# Patient Record
Sex: Female | Born: 1960 | Race: Black or African American | Hispanic: No | Marital: Married | Smoking: Former smoker
Health system: Southern US, Community
[De-identification: ages and names within clinical notes are randomized; demographics above are authoritative.]

## PROBLEM LIST (undated history)

## (undated) DIAGNOSIS — I480 Paroxysmal atrial fibrillation: Secondary | ICD-10-CM

## (undated) DIAGNOSIS — N898 Other specified noninflammatory disorders of vagina: Secondary | ICD-10-CM

## (undated) DIAGNOSIS — K219 Gastro-esophageal reflux disease without esophagitis: Secondary | ICD-10-CM

## (undated) DIAGNOSIS — E079 Disorder of thyroid, unspecified: Secondary | ICD-10-CM

## (undated) DIAGNOSIS — R7303 Prediabetes: Secondary | ICD-10-CM

## (undated) DIAGNOSIS — K5900 Constipation, unspecified: Secondary | ICD-10-CM

## (undated) DIAGNOSIS — N760 Acute vaginitis: Secondary | ICD-10-CM

## (undated) DIAGNOSIS — G4730 Sleep apnea, unspecified: Secondary | ICD-10-CM

## (undated) DIAGNOSIS — R319 Hematuria, unspecified: Secondary | ICD-10-CM

## (undated) DIAGNOSIS — R52 Pain, unspecified: Secondary | ICD-10-CM

## (undated) DIAGNOSIS — R87629 Unspecified abnormal cytological findings in specimens from vagina: Secondary | ICD-10-CM

## (undated) DIAGNOSIS — D649 Anemia, unspecified: Secondary | ICD-10-CM

## (undated) DIAGNOSIS — C3490 Malignant neoplasm of unspecified part of unspecified bronchus or lung: Secondary | ICD-10-CM

## (undated) DIAGNOSIS — I639 Cerebral infarction, unspecified: Secondary | ICD-10-CM

## (undated) DIAGNOSIS — M722 Plantar fascial fibromatosis: Secondary | ICD-10-CM

## (undated) DIAGNOSIS — I10 Essential (primary) hypertension: Secondary | ICD-10-CM

## (undated) DIAGNOSIS — D219 Benign neoplasm of connective and other soft tissue, unspecified: Secondary | ICD-10-CM

## (undated) DIAGNOSIS — I2510 Atherosclerotic heart disease of native coronary artery without angina pectoris: Secondary | ICD-10-CM

## (undated) DIAGNOSIS — B9689 Other specified bacterial agents as the cause of diseases classified elsewhere: Secondary | ICD-10-CM

## (undated) DIAGNOSIS — Z923 Personal history of irradiation: Secondary | ICD-10-CM

## (undated) DIAGNOSIS — G43909 Migraine, unspecified, not intractable, without status migrainosus: Secondary | ICD-10-CM

## (undated) DIAGNOSIS — M1990 Unspecified osteoarthritis, unspecified site: Secondary | ICD-10-CM

## (undated) DIAGNOSIS — M549 Dorsalgia, unspecified: Secondary | ICD-10-CM

## (undated) DIAGNOSIS — J45909 Unspecified asthma, uncomplicated: Secondary | ICD-10-CM

## (undated) DIAGNOSIS — E7800 Pure hypercholesterolemia, unspecified: Secondary | ICD-10-CM

## (undated) DIAGNOSIS — E119 Type 2 diabetes mellitus without complications: Secondary | ICD-10-CM

## (undated) DIAGNOSIS — I499 Cardiac arrhythmia, unspecified: Secondary | ICD-10-CM

## (undated) DIAGNOSIS — E039 Hypothyroidism, unspecified: Secondary | ICD-10-CM

## (undated) DIAGNOSIS — E785 Hyperlipidemia, unspecified: Secondary | ICD-10-CM

## (undated) DIAGNOSIS — R102 Pelvic and perineal pain: Secondary | ICD-10-CM

## (undated) HISTORY — DX: Hematuria, unspecified: R31.9

## (undated) HISTORY — DX: Plantar fascial fibromatosis: M72.2

## (undated) HISTORY — DX: Migraine, unspecified, not intractable, without status migrainosus: G43.909

## (undated) HISTORY — PX: KNEE SURGERY: SHX244

## (undated) HISTORY — DX: Acute vaginitis: N76.0

## (undated) HISTORY — DX: Hyperlipidemia, unspecified: E78.5

## (undated) HISTORY — PX: TUBAL LIGATION: SHX77

## (undated) HISTORY — DX: Pain, unspecified: R52

## (undated) HISTORY — DX: Benign neoplasm of connective and other soft tissue, unspecified: D21.9

## (undated) HISTORY — DX: Dorsalgia, unspecified: M54.9

## (undated) HISTORY — DX: Paroxysmal atrial fibrillation: I48.0

## (undated) HISTORY — DX: Other specified bacterial agents as the cause of diseases classified elsewhere: B96.89

## (undated) HISTORY — DX: Other specified noninflammatory disorders of vagina: N89.8

## (undated) HISTORY — PX: ECTOPIC PREGNANCY SURGERY: SHX613

## (undated) HISTORY — DX: Unspecified abnormal cytological findings in specimens from vagina: R87.629

## (undated) HISTORY — DX: Malignant neoplasm of unspecified part of unspecified bronchus or lung: C34.90

## (undated) HISTORY — DX: Constipation, unspecified: K59.00

## (undated) HISTORY — DX: Pure hypercholesterolemia, unspecified: E78.00

## (undated) HISTORY — DX: Pelvic and perineal pain: R10.2

---

## 2000-05-29 ENCOUNTER — Ambulatory Visit (HOSPITAL_COMMUNITY): Admission: RE | Admit: 2000-05-29 | Discharge: 2000-05-29 | Payer: Self-pay | Admitting: Internal Medicine

## 2000-05-29 ENCOUNTER — Encounter: Payer: Self-pay | Admitting: Internal Medicine

## 2000-06-01 ENCOUNTER — Other Ambulatory Visit: Admission: RE | Admit: 2000-06-01 | Discharge: 2000-06-01 | Payer: Self-pay | Admitting: Obstetrics and Gynecology

## 2000-06-17 ENCOUNTER — Ambulatory Visit (HOSPITAL_COMMUNITY): Admission: RE | Admit: 2000-06-17 | Discharge: 2000-06-17 | Payer: Self-pay | Admitting: Obstetrics and Gynecology

## 2000-06-17 ENCOUNTER — Encounter: Payer: Self-pay | Admitting: Obstetrics and Gynecology

## 2001-11-17 ENCOUNTER — Encounter: Payer: Self-pay | Admitting: Family Medicine

## 2001-11-17 ENCOUNTER — Ambulatory Visit (HOSPITAL_COMMUNITY): Admission: RE | Admit: 2001-11-17 | Discharge: 2001-11-17 | Payer: Self-pay | Admitting: Family Medicine

## 2002-06-28 ENCOUNTER — Ambulatory Visit (HOSPITAL_COMMUNITY): Admission: RE | Admit: 2002-06-28 | Discharge: 2002-06-28 | Payer: Self-pay | Admitting: Family Medicine

## 2003-01-26 ENCOUNTER — Emergency Department (HOSPITAL_COMMUNITY): Admission: EM | Admit: 2003-01-26 | Discharge: 2003-01-26 | Payer: Self-pay | Admitting: Emergency Medicine

## 2003-03-19 ENCOUNTER — Inpatient Hospital Stay (HOSPITAL_COMMUNITY): Admission: EM | Admit: 2003-03-19 | Discharge: 2003-03-23 | Payer: Self-pay | Admitting: Emergency Medicine

## 2003-05-26 ENCOUNTER — Encounter (HOSPITAL_COMMUNITY): Admission: RE | Admit: 2003-05-26 | Discharge: 2003-06-25 | Payer: Self-pay | Admitting: Orthopedic Surgery

## 2003-06-26 ENCOUNTER — Encounter (HOSPITAL_COMMUNITY): Admission: RE | Admit: 2003-06-26 | Discharge: 2003-07-26 | Payer: Self-pay | Admitting: Orthopedic Surgery

## 2003-08-08 ENCOUNTER — Encounter (HOSPITAL_COMMUNITY): Admission: RE | Admit: 2003-08-08 | Discharge: 2003-09-07 | Payer: Self-pay | Admitting: Orthopedic Surgery

## 2003-08-26 ENCOUNTER — Emergency Department (HOSPITAL_COMMUNITY): Admission: EM | Admit: 2003-08-26 | Discharge: 2003-08-26 | Payer: Self-pay | Admitting: Emergency Medicine

## 2003-08-28 ENCOUNTER — Inpatient Hospital Stay (HOSPITAL_COMMUNITY): Admission: RE | Admit: 2003-08-28 | Discharge: 2003-08-31 | Payer: Self-pay | Admitting: Orthopedic Surgery

## 2003-10-10 ENCOUNTER — Encounter (HOSPITAL_COMMUNITY): Admission: RE | Admit: 2003-10-10 | Discharge: 2003-11-09 | Payer: Self-pay | Admitting: Orthopedic Surgery

## 2003-11-14 ENCOUNTER — Encounter (HOSPITAL_COMMUNITY): Admission: RE | Admit: 2003-11-14 | Discharge: 2003-12-14 | Payer: Self-pay | Admitting: Orthopedic Surgery

## 2003-12-18 ENCOUNTER — Encounter (HOSPITAL_COMMUNITY): Admission: RE | Admit: 2003-12-18 | Discharge: 2004-01-17 | Payer: Self-pay | Admitting: Orthopedic Surgery

## 2004-01-22 ENCOUNTER — Encounter (HOSPITAL_COMMUNITY): Admission: RE | Admit: 2004-01-22 | Discharge: 2004-02-23 | Payer: Self-pay | Admitting: Orthopedic Surgery

## 2004-02-19 ENCOUNTER — Encounter: Admission: RE | Admit: 2004-02-19 | Discharge: 2004-05-19 | Payer: Self-pay | Admitting: Obstetrics and Gynecology

## 2004-10-16 ENCOUNTER — Ambulatory Visit (HOSPITAL_COMMUNITY): Admission: RE | Admit: 2004-10-16 | Discharge: 2004-10-16 | Payer: Self-pay | Admitting: Obstetrics and Gynecology

## 2004-11-01 ENCOUNTER — Emergency Department (HOSPITAL_COMMUNITY): Admission: EM | Admit: 2004-11-01 | Discharge: 2004-11-01 | Payer: Self-pay | Admitting: Emergency Medicine

## 2004-11-22 ENCOUNTER — Emergency Department (HOSPITAL_COMMUNITY): Admission: EM | Admit: 2004-11-22 | Discharge: 2004-11-22 | Payer: Self-pay | Admitting: Emergency Medicine

## 2005-05-03 ENCOUNTER — Emergency Department (HOSPITAL_COMMUNITY): Admission: EM | Admit: 2005-05-03 | Discharge: 2005-05-03 | Payer: Self-pay | Admitting: Emergency Medicine

## 2005-09-22 ENCOUNTER — Ambulatory Visit: Payer: Self-pay | Admitting: Orthopedic Surgery

## 2006-07-30 ENCOUNTER — Ambulatory Visit (HOSPITAL_COMMUNITY): Admission: RE | Admit: 2006-07-30 | Discharge: 2006-07-30 | Payer: Self-pay | Admitting: Obstetrics & Gynecology

## 2006-08-03 ENCOUNTER — Ambulatory Visit (HOSPITAL_COMMUNITY): Admission: RE | Admit: 2006-08-03 | Discharge: 2006-08-03 | Payer: Self-pay | Admitting: Obstetrics & Gynecology

## 2007-01-07 DIAGNOSIS — E049 Nontoxic goiter, unspecified: Secondary | ICD-10-CM

## 2007-01-07 HISTORY — DX: Nontoxic goiter, unspecified: E04.9

## 2007-03-10 ENCOUNTER — Ambulatory Visit: Payer: Self-pay | Admitting: Internal Medicine

## 2007-03-10 DIAGNOSIS — E1169 Type 2 diabetes mellitus with other specified complication: Secondary | ICD-10-CM | POA: Insufficient documentation

## 2007-03-10 DIAGNOSIS — J45909 Unspecified asthma, uncomplicated: Secondary | ICD-10-CM | POA: Insufficient documentation

## 2007-03-10 DIAGNOSIS — E119 Type 2 diabetes mellitus without complications: Secondary | ICD-10-CM | POA: Insufficient documentation

## 2007-03-10 DIAGNOSIS — E039 Hypothyroidism, unspecified: Secondary | ICD-10-CM | POA: Insufficient documentation

## 2007-03-10 DIAGNOSIS — K279 Peptic ulcer, site unspecified, unspecified as acute or chronic, without hemorrhage or perforation: Secondary | ICD-10-CM | POA: Insufficient documentation

## 2007-03-10 DIAGNOSIS — K219 Gastro-esophageal reflux disease without esophagitis: Secondary | ICD-10-CM | POA: Insufficient documentation

## 2007-03-10 DIAGNOSIS — E049 Nontoxic goiter, unspecified: Secondary | ICD-10-CM | POA: Insufficient documentation

## 2007-03-10 DIAGNOSIS — J310 Chronic rhinitis: Secondary | ICD-10-CM | POA: Insufficient documentation

## 2007-03-10 DIAGNOSIS — E785 Hyperlipidemia, unspecified: Secondary | ICD-10-CM | POA: Insufficient documentation

## 2007-03-10 DIAGNOSIS — E118 Type 2 diabetes mellitus with unspecified complications: Secondary | ICD-10-CM | POA: Insufficient documentation

## 2007-03-10 DIAGNOSIS — I10 Essential (primary) hypertension: Secondary | ICD-10-CM | POA: Insufficient documentation

## 2007-03-10 DIAGNOSIS — D509 Iron deficiency anemia, unspecified: Secondary | ICD-10-CM | POA: Insufficient documentation

## 2007-03-10 DIAGNOSIS — N951 Menopausal and female climacteric states: Secondary | ICD-10-CM | POA: Insufficient documentation

## 2007-03-11 ENCOUNTER — Telehealth (INDEPENDENT_AMBULATORY_CARE_PROVIDER_SITE_OTHER): Payer: Self-pay | Admitting: *Deleted

## 2007-03-17 ENCOUNTER — Ambulatory Visit (HOSPITAL_COMMUNITY): Admission: RE | Admit: 2007-03-17 | Discharge: 2007-03-17 | Payer: Self-pay | Admitting: Internal Medicine

## 2007-03-22 ENCOUNTER — Telehealth (INDEPENDENT_AMBULATORY_CARE_PROVIDER_SITE_OTHER): Payer: Self-pay | Admitting: *Deleted

## 2007-03-29 ENCOUNTER — Telehealth (INDEPENDENT_AMBULATORY_CARE_PROVIDER_SITE_OTHER): Payer: Self-pay | Admitting: *Deleted

## 2007-04-02 ENCOUNTER — Encounter (INDEPENDENT_AMBULATORY_CARE_PROVIDER_SITE_OTHER): Payer: Self-pay | Admitting: Internal Medicine

## 2007-04-07 ENCOUNTER — Encounter (INDEPENDENT_AMBULATORY_CARE_PROVIDER_SITE_OTHER): Payer: Self-pay | Admitting: Internal Medicine

## 2007-04-16 ENCOUNTER — Ambulatory Visit: Payer: Self-pay | Admitting: Internal Medicine

## 2007-04-16 DIAGNOSIS — L293 Anogenital pruritus, unspecified: Secondary | ICD-10-CM | POA: Insufficient documentation

## 2007-04-16 LAB — CONVERTED CEMR LAB
Blood Glucose, Fingerstick: 79
Hgb A1c MFr Bld: 6.2 %

## 2007-04-20 ENCOUNTER — Telehealth (INDEPENDENT_AMBULATORY_CARE_PROVIDER_SITE_OTHER): Payer: Self-pay | Admitting: *Deleted

## 2007-04-20 LAB — CONVERTED CEMR LAB
ALT: 18 units/L (ref 0–35)
AST: 14 units/L (ref 0–37)
Albumin: 4.5 g/dL (ref 3.5–5.2)
Alkaline Phosphatase: 59 units/L (ref 39–117)
BUN: 13 mg/dL (ref 6–23)
CO2: 28 meq/L (ref 19–32)
Calcium: 9.6 mg/dL (ref 8.4–10.5)
Chloride: 103 meq/L (ref 96–112)
Cholesterol: 235 mg/dL — ABNORMAL HIGH (ref 0–200)
Creatinine, Ser: 0.88 mg/dL (ref 0.40–1.20)
Free T4: 1.31 ng/dL (ref 0.89–1.80)
Glucose, Bld: 96 mg/dL (ref 70–99)
HDL: 59 mg/dL (ref >39)
LDL Cholesterol: 157 mg/dL — ABNORMAL HIGH (ref 0–99)
Potassium: 4.2 meq/L (ref 3.5–5.3)
Sodium: 140 meq/L (ref 135–145)
TSH: 1.468 microintl units/mL (ref 0.350–5.50)
Total Bilirubin: 0.7 mg/dL (ref 0.3–1.2)
Total CHOL/HDL Ratio: 4
Total Protein: 7.2 g/dL (ref 6.0–8.3)
Triglycerides: 97 mg/dL (ref <150)
VLDL: 19 mg/dL (ref 0–40)

## 2007-05-18 ENCOUNTER — Ambulatory Visit: Payer: Self-pay | Admitting: Internal Medicine

## 2007-05-18 DIAGNOSIS — M79609 Pain in unspecified limb: Secondary | ICD-10-CM | POA: Insufficient documentation

## 2007-06-23 ENCOUNTER — Ambulatory Visit: Payer: Self-pay | Admitting: Internal Medicine

## 2007-06-23 DIAGNOSIS — R071 Chest pain on breathing: Secondary | ICD-10-CM | POA: Insufficient documentation

## 2007-06-24 ENCOUNTER — Telehealth (INDEPENDENT_AMBULATORY_CARE_PROVIDER_SITE_OTHER): Payer: Self-pay | Admitting: *Deleted

## 2007-07-07 ENCOUNTER — Encounter (INDEPENDENT_AMBULATORY_CARE_PROVIDER_SITE_OTHER): Payer: Self-pay | Admitting: Internal Medicine

## 2007-07-07 ENCOUNTER — Ambulatory Visit: Payer: Self-pay | Admitting: Internal Medicine

## 2007-07-07 ENCOUNTER — Other Ambulatory Visit: Admission: RE | Admit: 2007-07-07 | Discharge: 2007-07-07 | Payer: Self-pay

## 2007-07-07 LAB — CONVERTED CEMR LAB: OCCULT 1: NEGATIVE

## 2007-08-27 ENCOUNTER — Telehealth (INDEPENDENT_AMBULATORY_CARE_PROVIDER_SITE_OTHER): Payer: Self-pay | Admitting: *Deleted

## 2007-12-22 ENCOUNTER — Ambulatory Visit (HOSPITAL_COMMUNITY): Admission: RE | Admit: 2007-12-22 | Discharge: 2007-12-22 | Payer: Self-pay | Admitting: Obstetrics and Gynecology

## 2008-02-03 ENCOUNTER — Encounter (INDEPENDENT_AMBULATORY_CARE_PROVIDER_SITE_OTHER): Payer: Self-pay | Admitting: Internal Medicine

## 2008-05-18 ENCOUNTER — Encounter (INDEPENDENT_AMBULATORY_CARE_PROVIDER_SITE_OTHER): Payer: Self-pay | Admitting: Internal Medicine

## 2008-05-28 ENCOUNTER — Observation Stay (HOSPITAL_COMMUNITY): Admission: EM | Admit: 2008-05-28 | Discharge: 2008-05-30 | Payer: Self-pay | Admitting: Emergency Medicine

## 2008-06-28 ENCOUNTER — Other Ambulatory Visit: Admission: RE | Admit: 2008-06-28 | Discharge: 2008-06-28 | Payer: Self-pay | Admitting: Obstetrics and Gynecology

## 2008-07-12 ENCOUNTER — Ambulatory Visit (HOSPITAL_COMMUNITY): Admission: RE | Admit: 2008-07-12 | Discharge: 2008-07-12 | Payer: Self-pay | Admitting: Obstetrics & Gynecology

## 2008-08-21 ENCOUNTER — Encounter (INDEPENDENT_AMBULATORY_CARE_PROVIDER_SITE_OTHER): Payer: Self-pay | Admitting: Internal Medicine

## 2009-01-06 DIAGNOSIS — R1310 Dysphagia, unspecified: Secondary | ICD-10-CM

## 2009-01-06 DIAGNOSIS — K921 Melena: Secondary | ICD-10-CM

## 2009-01-06 HISTORY — DX: Dysphagia, unspecified: R13.10

## 2009-01-06 HISTORY — DX: Melena: K92.1

## 2009-02-15 ENCOUNTER — Ambulatory Visit (HOSPITAL_COMMUNITY)
Admission: RE | Admit: 2009-02-15 | Discharge: 2009-02-15 | Payer: Self-pay | Admitting: Physical Medicine and Rehabilitation

## 2009-02-21 ENCOUNTER — Ambulatory Visit (HOSPITAL_COMMUNITY)
Admission: RE | Admit: 2009-02-21 | Discharge: 2009-02-21 | Payer: Self-pay | Admitting: Physical Medicine and Rehabilitation

## 2009-03-22 ENCOUNTER — Ambulatory Visit (HOSPITAL_COMMUNITY): Admission: RE | Admit: 2009-03-22 | Discharge: 2009-03-22 | Payer: Self-pay | Admitting: Obstetrics and Gynecology

## 2009-11-14 ENCOUNTER — Other Ambulatory Visit
Admission: RE | Admit: 2009-11-14 | Discharge: 2009-11-14 | Payer: Self-pay | Source: Home / Self Care | Admitting: Obstetrics and Gynecology

## 2009-11-20 ENCOUNTER — Encounter (INDEPENDENT_AMBULATORY_CARE_PROVIDER_SITE_OTHER): Payer: Self-pay | Admitting: *Deleted

## 2009-12-05 ENCOUNTER — Ambulatory Visit: Payer: Self-pay | Admitting: Internal Medicine

## 2009-12-05 DIAGNOSIS — R1310 Dysphagia, unspecified: Secondary | ICD-10-CM | POA: Insufficient documentation

## 2009-12-05 DIAGNOSIS — K921 Melena: Secondary | ICD-10-CM | POA: Insufficient documentation

## 2009-12-06 ENCOUNTER — Encounter: Payer: Self-pay | Admitting: Gastroenterology

## 2009-12-06 ENCOUNTER — Encounter: Payer: Self-pay | Admitting: Internal Medicine

## 2009-12-19 ENCOUNTER — Ambulatory Visit (HOSPITAL_COMMUNITY)
Admission: RE | Admit: 2009-12-19 | Discharge: 2009-12-19 | Payer: Self-pay | Source: Home / Self Care | Attending: Gastroenterology | Admitting: Gastroenterology

## 2009-12-19 HISTORY — PX: ESOPHAGOGASTRODUODENOSCOPY: SHX1529

## 2009-12-19 HISTORY — PX: OTHER SURGICAL HISTORY: SHX169

## 2009-12-21 ENCOUNTER — Telehealth (INDEPENDENT_AMBULATORY_CARE_PROVIDER_SITE_OTHER): Payer: Self-pay

## 2009-12-24 ENCOUNTER — Encounter (INDEPENDENT_AMBULATORY_CARE_PROVIDER_SITE_OTHER): Payer: Self-pay

## 2009-12-25 ENCOUNTER — Emergency Department (HOSPITAL_COMMUNITY)
Admission: EM | Admit: 2009-12-25 | Discharge: 2009-12-25 | Payer: Self-pay | Source: Home / Self Care | Admitting: Emergency Medicine

## 2010-01-28 ENCOUNTER — Encounter: Payer: Self-pay | Admitting: Internal Medicine

## 2010-02-07 NOTE — Letter (Signed)
Summary: Diabetes Care Flowsheet  Diabetes Care Flowsheet   Imported By: Carlye Grippe 08/28/2008 11:48:49  _____________________________________________________________________  External Attachment:    Type:   Image     Comment:   External Document

## 2010-02-07 NOTE — Progress Notes (Signed)
Summary: initial evaluation  initial evaluation   Imported By: Jacklynn Ganong 05/16/2008 08:03:42  _____________________________________________________________________  External Attachment:    Type:   Image     Comment:   External Document

## 2010-02-07 NOTE — Assessment & Plan Note (Signed)
Summary: new patient/arc   Vital Signs:  Patient Profile:   50 Years Old Female Height:     68.5 inches Weight:      241 pounds BMI:     36.24 O2 Sat:      98 % O2 treatment:    Room Air Pulse rate:   79 / minute Resp:     8 per minute BP sitting:   154 / 108  (left arm)  Vitals Entered By: Lutricia Horsfall (March 10, 2007 2:19 PM)                 Chief Complaint:  new patient/sinus problems.  History of Present Illness: 50 year old woman here to establish care.  She says she did not take her medications this morning.  She says she has been running to doctors' appointment.  She has been going to [facility].  She was seen this morning by Dr. Emelda Fear for a yeast infection.    She says she has trouble with her sinuses for 3 years.  It fluctuates during the year, but is not seasonal.  She has been given steroid shots 1 time since January and 2 times last year.  She has tried nasal spray also. She complains of nasal congestion, post nasal drip, headaches and lightheaded.  She also complains of nausea without vomiting.  She has episodes of sweats and dizziness with the nausea at times.  She has been given 2 z-packs recently.  She got one for her sinuses and she went back 2 weeks later and was told she had a touch of the flu and was given another z-pack, tamiflu and a  cough syrup.  SHe finished the z-pack yesterday.  She just got a prescription for nasocort filled.    She has some trouble with shortness of breath and has had a cough only for the past 2 weeks.    SHe has had diabetes since age 50 and it has been diet controlled.  She does not know when the last time it was checked was.  She says she is on synthroid for an underactive thyroid, but says she has had a thyroid goiter also.    Current Allergies: No known allergies   Past Medical History:    GERD    Hyperlipidemia    Hypertension    Peptic ulcer disease    thyroid goiter/hypothyroidism    asthma, childhood  menopause    Anemia-iron deficiency-acute, secondary to surgery    Diabetes mellitus, type II--diet controlled since age 50  Past Surgical History:    ORIF-right proximal tibia, failed, s/p revision 08/28/03    ectopic pregnancy x2 (removal of left fallopian tube and ovary)   Family History:    father-deceased-52-heart problem    mother-deceased-53-DM, HTN    Son-30--healthy    Daughters--    24-healthy    16-migraine headaches  Social History:    Married lives with spouse and children    Former Smoker-2ppd x20 years, quit 03/19/03    Alcohol use-no    Drug use-no   Risk Factors:  Tobacco use:  quit Drug use:  no Alcohol use:  no   Review of Systems  General      Denies chills, fever, and sweats.  Eyes      Denies blurring and double vision.  ENT      See HPI  CV      Denies chest pain or discomfort and palpitations.  Resp  See HPI  GI      See HPI      Denies abdominal pain and diarrhea.  GU      Denies dysuria, incontinence, and urinary frequency.  MS      Denies joint pain and muscle aches.  Neuro      See HPI      Denies numbness and seizures.  Psych      Denies anxiety and depression.  Endo      See HPI   Physical Exam  General:     Obese, in no acute distress. Alert and oriented X 3.  Eyes:     EOMI, PERRL, sclera anicteric  Ears:     Tympanic membranes clear.  Ear canals without erythema.  Nose:     moderate Erythema and swelling with clear drainage.  Mouth:     No eyrthema or exudate.  Neck:     No lymphadenopathy, JVD, or carotid bruits.  There is mild diffuse thyroid enlargement.  Lungs:     Clear to auscultation bilaterally with good air movement, normal expansion.  Heart:     Normal S1 and S2. No murmurs or extracardiac sounds. PMI is nondisplaced.  Abdomen:     Soft, nontender, nondistended, + bowel sounds, no organomegaly.  Extremities:     No edema, calf tenderness or swelling.     Impression &  Recommendations:  Problem # 1:  RHINITIS, CHRONIC (ICD-472.0) She has had multiple courses of antibiotics and steroids and still is having trouble with nasal congestion and sinus pressure.  We need further evaluation with a CT of the sinuses.  We are going to start her on Xyzal 5mg  at bedtime, have her use the nasocort AQ on a daily basis and see what the CT shows. Orders: CT without Contrast (CT w/o contrast)   Problem # 2:  HYPERTENSION (ICD-401.9) She did not take her medication this morning.  We need to see how her blood pressures look in her old records.   Her updated medication list for this problem includes:    Lisinopril-hydrochlorothiazide 20-12.5 Mg Tabs (Lisinopril-hydrochlorothiazide) .Marland Kitchen... 1 by mouth once daily   Problem # 3:  DIABETES MELLITUS, TYPE II (ICD-250.00) We need records to see when her last HbA1c, urine microalbumin,... were.   Her updated medication list for this problem includes:    Lisinopril-hydrochlorothiazide 20-12.5 Mg Tabs (Lisinopril-hydrochlorothiazide) .Marland Kitchen... 1 by mouth once daily   Complete Medication List: 1)  Levoxyl 88 Mcg Tabs (Levothyroxine sodium) .Marland Kitchen.. 1 by mouth once daily 2)  Lisinopril-hydrochlorothiazide 20-12.5 Mg Tabs (Lisinopril-hydrochlorothiazide) .Marland Kitchen.. 1 by mouth once daily 3)  Piroxicam 20 Mg Caps (Piroxicam) .Marland Kitchen.. 1 by mouth once daily 4)  Vytorin 10-40 Mg Tabs (Ezetimibe-simvastatin) .Marland Kitchen.. 1 by mouth once daily 5)  Climara Pro 0.045-0.015 Mg/day Ptwk (Estradiol-levonorgestrel) .... Apply once weekly 6)  Nasacort Aq 55 Mcg/act Aers (Triamcinolone acetonide(nasal)) .... 2 sprays in each nostril once daily 7)  Xyzal 5 Mg Tabs (Levocetirizine dihydrochloride) .Marland Kitchen.. 1 by mouth at bedtime   Patient Instructions: 1)  The patient will be called with the results when they are available. 2)  We will let her know when I have had a chance to review her old records and see when she needs her diabetic follow up.      Prescriptions: FLUTICASONE  PROPIONATE 50 MCG/ACT  SUSP (FLUTICASONE PROPIONATE) 2 sprays in each nostril two times a day  #1 bottle x 5   Entered and Authorized by:   Erle Crocker MD  Signed by:   Erle Crocker MD on 03/10/2007   Method used:   Print then Give to Patient   RxID:   [id number]  ]    Preventive Care Screening  Mammogram:    Date:  08/03/2006    Next Due:  08/2007    Results:  bi rads 1  Ejection Fraction:    Date:  06/28/2002    Results:  normal   Appended Document: new patient/arc records requested on 03.05.09 from [facility]...arc

## 2010-02-07 NOTE — Progress Notes (Signed)
Summary: CT referral  Phone Note Outgoing Call   Call placed by: Sonny Dandy,  March 11, 2007 9:37 AM Summary of Call: order faxed to [facility], appointment set for 03/17/07 at 900am, order faxed and message left for patient to return call .................................................................Marland KitchenMarland KitchenSonny Dandy  March 11, 2007 9:37 AM  referral info given to patient, voices understanding .................................................................Marland KitchenMarland KitchenSonny Dandy  March 12, 2007 11:16 AM  Initial call taken by: Sonny Dandy,  March 12, 2007 11:16 AM

## 2010-02-07 NOTE — Progress Notes (Signed)
Summary: still in pain  Phone Note Call from Patient   Reason for Call: Talk to Nurse Summary of Call: patient called stating that the shot didn't help and she is still in a lot of pain...  she is at work...please call her there...[phone] Initial call taken by: Donneta Romberg,  June 24, 2007 9:27 AM  Follow-up for Phone Call        attempted to call pt at work, she is on break, has left the building. Follow-up by: Lutricia Horsfall,  June 25, 2007 1:55 PM  Additional Follow-up for Phone Call Additional follow up Details #1::        States that she is feeling better but still not great, having to take Ibuprofen 4 tabs 2-3 times daily.  Advised her that if she is still not feeling better by the end of the week that she should call to be seen.  Pt verbalized understanding. Additional Follow-up by: Lutricia Horsfall,  June 29, 2007 10:00 AM

## 2010-02-07 NOTE — Letter (Signed)
Summary: REFERRAL FROM [facility]  REFERRAL FROM [facility]   Imported By: Rexene Alberts 12/06/2009 16:16:14  _____________________________________________________________________  External Attachment:    Type:   Image     Comment:   External Document

## 2010-02-07 NOTE — Progress Notes (Signed)
Summary: phone note/ pt having chest discomfort  Phone Note Call from Patient   Caller: Patient Summary of Call: Pt called. Had procedures on 12/19/2009. Having some chest discomfort in center of chest intermittently. Relieved when she burps. Wants to know what she should do. I told her it would relay the message, but to go to the hospital if it worsens before she hears back from [place].  Initial call taken by: Cloria Spring LPN,  December 21, 2009 1:55 PM     Appended Document: phone note/ pt having chest discomfort Per Dr. Darrick Penna, pt is most likely having reflux. Continue Nexium as prescribed. May take Mylanta or Maalox as needed. Pt to call if continues to have problems. Pt is informed.

## 2010-02-07 NOTE — Letter (Signed)
Summary: Unable to Reach, Consult Scheduled  [facility]  [address]   [place], [place] [address]   Phone: [phone]  Fax: [phone]    11/20/2009  Becky [name] [address] [place], [place]  [address] [birth year]   Dear [name],   We have been unable to reach you by phone.  At the recommendation of [facility] we have been asked to schedule you a consult with DR Jena Gauss for ACID REFLUX.   Please call our office at [phone].     Thank you,    Diana Eves  [facility] R. Roetta Sessions, M.D.    Jonette Eva, M.D. Lorenza Burton, FNP-BC    Tana Coast, PA-C Phone: [phone]    Fax: [phone]
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
## 2010-02-07 NOTE — Letter (Signed)
Summary: Services Final Notice  [facility]  [address]   [place], [place] [address]   Phone: [phone]  Fax: [phone]    05/18/2008  DANICIA TERHAAR [address] [place], [place]  [address]  Dear Ms. Solar,   The staff at [facility] is dedicated to providing the highest quality health care to our patients. Our records indicate that you were sent a letter on 02-03-2008 advising of overdue followup of your cholesterol, hypertension, thyroid and mammogram.  Due to your inaction on followup of your medical condition, we are sending you this Final Notice that you will be discharged from our practice one month from the date of this letter if followup is not completed.  Please call our office to schedule a visit.  Sincerely,   Micah Flesher, LPN

## 2010-02-07 NOTE — Progress Notes (Signed)
Summary: wants samples  Phone Note Call from Patient   Reason for Call: Talk to Nurse Summary of Call: patient is requesting samples of Xyzal...  [phone] Initial call taken by: Donneta Romberg,  August 27, 2007 3:15 PM  Follow-up for Phone Call        Samples left at front desk. Follow-up by: Lutricia Horsfall,  August 27, 2007 3:30 PM  Additional Follow-up for Phone Call Additional follow up Details #1::        advised patient Additional Follow-up by: Donneta Romberg,  August 27, 2007 3:39 PM      Prescriptions: XYZAL 5 MG  TABS (LEVOCETIRIZINE DIHYDROCHLORIDE) 1 by mouth at bedtime  #10 x 0   Entered by:   Sherrie Geophysicist/field seismologist   Authorized by:   Erle Crocker MD   Signed by:   Donneta Romberg on 08/27/2007   Method used:   Samples Given   RxID:   [id number]

## 2010-02-07 NOTE — Letter (Signed)
Summary: Plan of Care, Need to Discuss  [facility]  [address]   [place], [place] [address]   Phone: [phone]  Fax: [phone]    December 24, 2009  Becky [name] [address] [place], [place]  [address] [birth year]   Dear Ms. Poss,   We are writing this letter to inform you of treatment plans and/or discuss your plan of care.  We have tried several times to contact you; however, we have yet to reach you.  We ask that you please contact our office for follow-up on your gastrointestinal issues.  We can  be reached at [phone] to schedule an appointment, or to speak with someone regarding your health care needs.  Please do not neglect your health.   Sincerely,    Cloria Spring LPN  [facility] Ph: [phone]    Fax: [phone]

## 2010-02-07 NOTE — Progress Notes (Signed)
Summary: CT results  Phone Note Outgoing Call   Call placed by: Sonny Dandy,  March 22, 2007 8:32 AM Summary of Call: results given to patient and advised to call next week, voices understanding  Initial call taken by: Sonny Dandy,  March 22, 2007 8:32 AM

## 2010-02-07 NOTE — Letter (Signed)
Summary: EGD/ED/TCS ORDER  EGD/ED/TCS ORDER   Imported By: Ave Filter 12/06/2009 14:06:14  _____________________________________________________________________  External Attachment:    Type:   Image     Comment:   External Document

## 2010-02-07 NOTE — Letter (Signed)
Summary: Services Due  [facility]  [address]   [place], [place] [address]   Phone: [phone]  Fax: [phone]    02/03/2008   Becky [name] [address] [place], [place]  [address]   Dear  Becky [name],  The staff at [facility] is dedicated to providing the highest quality health care to our patients.  Our records indicate that you are overdue for followup of your cholesterol, hypertension, thyroid and mammogram.  Please call our office to schedule a visit.   Sincerely,  Erle Crocker MD

## 2010-02-07 NOTE — Progress Notes (Signed)
Summary: Xyzal  Phone Note Call from Patient   Summary of Call: patient states the last time she was in you gave her samples of XYZAL 5 mg. she wants to know if she can get a prescription called into [facility], if this is too expensive, do you have samples.  She says she is feeling much better, and she can tell when she takes this medication vs. when she doesn't. Please advise. She can be reached at [facility] at [phone], until 5 or after 5 she can be reached at [phone]. thanks Initial call taken by: Curtis Sites,  March 29, 2007 4:15 PM  Follow-up for Phone Call        Rx sent to [facility]. Follow-up by: Lutricia Horsfall,  March 29, 2007 4:29 PM  Additional Follow-up for Phone Call Additional follow up Details #1::        I called and let the patient know. Additional Follow-up by: Curtis Sites,  March 30, 2007 9:25 AM      Prescriptions: XYZAL 5 MG  TABS (LEVOCETIRIZINE DIHYDROCHLORIDE) 1 by mouth at bedtime  #30 x 5   Entered by:   Sherrie Geophysicist/field seismologist   Authorized by:   Erle Crocker MD   Signed by:   Curtis Sites on 03/30/2007   Method used:   Electronically sent to ...       [facility]*       [address]       [place], [place]  [address]       Ph: [phone] or [phone]       Fax: [phone]   RxID:   [id number]

## 2010-02-07 NOTE — Assessment & Plan Note (Signed)
Summary: HTn/lipids/thyroid/rhinitis/   Vital Signs:  Patient Profile:   50 Years Old Female Height:     68.5 inches O2 Sat:      98 % O2 treatment:    Room Air Pulse rate:   71 / minute Resp:     8 per minute BP sitting:   142 / 102  (left arm)  Vitals Entered By: Lutricia Horsfall (April 16, 2007 8:48 AM)             CBG Result 79     Chief Complaint:  followup DM and HTN.  History of Present Illness: Here for routine follow up.  Her sinus symptoms and breathing are controlled with Xyzal.  She hasn't taken her blood pressure medication yet as she is fasting for labs.  She is also complaining of some vaginal itching with a clear yellow discharge.  SHe thinks she has a yeast infection but is not having thick white discharge.  She is wondering about less expensive medications, especially her climara patch.  SHe uses it for hot flashes and over the counter medications have been ineffective.      Current Allergies: No known allergies   Past Medical History:    Reviewed history from 03/10/2007 and no changes required:       GERD       Hyperlipidemia       Hypertension       Peptic ulcer disease       thyroid goiter/hypothyroidism       asthma, childhood       menopause       Anemia-iron deficiency-acute, secondary to surgery       Diabetes mellitus, type II--diet controlled since age 89  Past Surgical History:    Reviewed history from 03/10/2007 and no changes required:       ORIF-right proximal tibia, failed, s/p revision 08/28/03       ectopic pregnancy x2 (removal of left fallopian tube and ovary)   Family History:    Reviewed history from 03/10/2007 and no changes required:       father-deceased-52-heart problem       mother-deceased-53-DM, HTN       Son-30--healthy       Daughters--       24-healthy       16-migraine headaches  Social History:    Reviewed history from 03/10/2007 and no changes required:       Married lives with spouse and children  Former Smoker-2ppd x20 years, quit 03/19/03       Alcohol use-no       Drug use-no     Physical Exam  General:     Obese, in no acute distress. Alert and oriented X 3.  Lungs:     Clear to auscultation bilaterally with good air movement, normal expansion.  Heart:     Normal S1 and S2. No murmurs or extracardiac sounds. PMI is nondisplaced.  Extremities:     No edema, calf tenderness or swelling.     Impression & Recommendations:  Problem # 1:  HYPERTENSION (ICD-401.9) Her blood pressure is up today but she hasn't taken her medication.  Her home blood pressures sound adequate.  She is due for labs. Her updated medication list for this problem includes:    Lisinopril-hydrochlorothiazide 20-12.5 Mg Tabs (Lisinopril-hydrochlorothiazide) .Marland Kitchen... 1 by mouth once daily  Orders: T-Comprehensive Metabolic Panel ([id number]) Venipuncture (56213)   Problem # 2:  HYPERLIPIDEMIA (ICD-272.4) Lipids and liver enzymes are drawn today  and adjustments will be made in medications based on the lab results.  She is interested in something less expensive for her cholesterol so we will see what her lipid panel shows and if we can will change her to simvastatin at the 40 or 80mg  dose.    Her updated medication list for this problem includes:    Vytorin 10-40 Mg Tabs (Ezetimibe-simvastatin) .Marland Kitchen... 1 by mouth once daily  Orders: T-Comprehensive Metabolic Panel [phone]) T-Lipid Profile ([id number])   Problem # 3:  HYPOTHYROIDISM (ICD-244.9) The patient appears clinically euthyroid.  TSH and free T4 will be checked and medication adjusted as needed based on the results.  Her updated medication list for this problem includes:    Levoxyl 88 Mcg Tabs (Levothyroxine sodium) .Marland Kitchen... 1 by mouth once daily  Orders: T-TSH ([id number]) T-T4, Free [id number])   Problem # 4:  RHINITIS, CHRONIC (ICD-472.0) Doing well with Xyzal.  Problem # 5:  VAGINAL PRURITUS (ICD-698.1) It sounds more  like bacterial vaginosis so we are going to try flagyl 2gm by mouth.    Problem # 6:  MENOPAUSAL SYNDROME (ICD-627.2) We will try estradiol and provera daily for cost savings. Her updated medication list for this problem includes:    Estradiol 1 Mg Tabs (Estradiol) .Marland Kitchen... 1 by mouth once daily   Complete Medication List: 1)  Levoxyl 88 Mcg Tabs (Levothyroxine sodium) .Marland Kitchen.. 1 by mouth once daily 2)  Lisinopril-hydrochlorothiazide 20-12.5 Mg Tabs (Lisinopril-hydrochlorothiazide) .Marland Kitchen.. 1 by mouth once daily 3)  Piroxicam 20 Mg Caps (Piroxicam) .Marland Kitchen.. 1 by mouth once daily 4)  Vytorin 10-40 Mg Tabs (Ezetimibe-simvastatin) .Marland Kitchen.. 1 by mouth once daily 5)  Estradiol 1 Mg Tabs (Estradiol) .Marland Kitchen.. 1 by mouth once daily 6)  Nasacort Aq 55 Mcg/act Aers (Triamcinolone acetonide(nasal)) .... 2 sprays in each nostril once daily 7)  Xyzal 5 Mg Tabs (Levocetirizine dihydrochloride) .Marland Kitchen.. 1 by mouth at bedtime 8)  Metronidazole 500 Mg Tabs (Metronidazole) .... 4 by mouth at one time 9)  Medroxyprogesterone Acetate 2.5 Mg Tabs (Medroxyprogesterone acetate) .Marland Kitchen.. 1 by mouth once daily   Patient Instructions: 1)  The patient will be called with the results when they are available. 2)  Please schedule a follow-up appointment in 6 months.    Prescriptions: MEDROXYPROGESTERONE ACETATE 2.5 MG  TABS (MEDROXYPROGESTERONE ACETATE) 1 by mouth once daily  #90 x 1   Entered and Authorized by:   Erle Crocker MD   Signed by:   Erle Crocker MD on 04/16/2007   Method used:   Electronically sent to ...       [facility]*       [address]       [place], [place]  [address]       Ph: [phone] or [phone]       Fax: [phone]   RxID:   [id number] ESTRADIOL 1 MG  TABS (ESTRADIOL) 1 by mouth once daily  #90 x 1   Entered and Authorized by:   Erle Crocker MD   Signed by:   Erle Crocker MD on 04/16/2007   Method used:   Electronically sent to ...       [facility]*       [address]       [place], [place]  [address]       Ph: [phone] or [phone]       Fax: [phone]   RxID:  [id number] METRONIDAZOLE 500 MG  TABS (METRONIDAZOLE) 4 by mouth at one time  #4 x 0   Entered and Authorized by:   Erle Crocker MD   Signed by:   Erle Crocker MD on 04/16/2007   Method used:   Electronically sent to ...       [facility]*       [address]       [place], [place]  [address]       Ph: [phone] or [phone]       Fax: [phone]   RxID:   [id number]  ] Laboratory Results   Blood Tests   Date/Time Recieved: April 16, 2007 8:49 AM   HGBA1C: 6.2%   (Normal Range: Non-Diabetic - 3-6%   Control Diabetic - 6-8%) CBG Random: 79

## 2010-02-07 NOTE — Assessment & Plan Note (Signed)
Summary: chest Ladene Allocca pain   Vital Signs:  Patient Profile:   50 Years Old Female Height:     68.5 inches O2 Sat:      99 % O2 treatment:    Room Air Pulse rate:   76 / minute Resp:     12 per minute BP sitting:   126 / 84  (right arm)  Vitals Entered By: Lutricia Horsfall (June 23, 2007 2:59 PM)                 Chief Complaint:  left shoulder pain.  History of Present Illness: Complaining of pain in upper chest and shoulder blade that she awoke with this morning.  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      Current Allergies: No known allergies       Physical Exam  General:     Obese, in no acute distress. Alert and oriented X 3.\par  Chest Taron Conrey:     + tenderness to palpation over left upper chest Ortencia Askari.  Msk:     normal ROM without tenderness at left shoulder.      Impression & Recommendations:  Problem # 1:  CHEST Florena Kozma PAIN, ANTERIOR (ICD-786.52) IM toradol today.  Aleve 2 by mouth two times a day as needed. The following medications were removed from the medication list:    Piroxicam 20 Mg Caps (Piroxicam) .Marland Kitchen... 1 by mouth once daily  Orders: Ketorolac-Toradol 15mg  (Z6109) Admin of Therapeutic Inj  intramuscular or subcutaneous (60454)   Complete Medication List: 1)  Levoxyl 88 Mcg Tabs (Levothyroxine sodium) .Marland Kitchen.. 1 by mouth once daily 2)  Lisinopril-hydrochlorothiazide 20-12.5 Mg Tabs (Lisinopril-hydrochlorothiazide) .Marland Kitchen.. 1 by mouth once daily 3)  Simvastatin 80 Mg Tabs (Simvastatin) .Marland Kitchen.. 1 by mouth once daily 4)  Estradiol 1 Mg Tabs (Estradiol) .Marland Kitchen.. 1 by mouth once daily 5)  Omnaris 50 Mcg/act Susp (Ciclesonide) .... 2 sprays in each nostril once daily 6)  Xyzal 5 Mg Tabs (Levocetirizine dihydrochloride) .Marland Kitchen.. 1 by mouth at bedtime 7)  Metronidazole 500 Mg Tabs (Metronidazole) .... 4 by mouth at one time 8)  Medroxyprogesterone Acetate 2.5 Mg Tabs (Medroxyprogesterone  acetate) .Marland Kitchen.. 1 by mouth once daily    ]  Medication Administration  Injection # 1:    Medication: Ketorolac-Toradol 15mg     Diagnosis: CHEST Andee Chivers PAIN, ANTERIOR (ICD-786.52)    Route: IM    Site: RUOQ gluteus    Exp Date: 4/10    Lot #: [id number]    Mfr: Alfonso Ramus    Comments: 60mg  IMper MD order    Patient tolerated injection without complications    Given by: Lutricia Horsfall (June 23, 2007 3:05 PM)  Orders Added: 1)  Est. Patient Level III [14782] 2)  Ketorolac-Toradol 15mg  [J1885] 3)  Admin of Therapeutic Inj  intramuscular or subcutaneous [95621]

## 2010-02-07 NOTE — Letter (Signed)
Summary: ABI  lower extremity   Imported By: Curtis Sites 05/18/2007 11:49:35  _____________________________________________________________________  External Attachment:    Type:   Image     Comment:   External Document
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
## 2010-02-07 NOTE — Letter (Signed)
Summary: Historic Patient File  Historic Patient File   Imported By: Curtis Sites 04/07/2007 11:39:31  _____________________________________________________________________  External Attachment:    Type:   Image     Comment:   External Document

## 2010-02-07 NOTE — Progress Notes (Signed)
Summary: 04/16/07 lab results  Phone Note Outgoing Call   Call placed by: Sonny Dandy,  April 20, 2007 8:34 AM Summary of Call: Results given to patient, voices understanding , will call med into [facility] pharmacy Initial call taken by: Sonny Dandy,  April 20, 2007 8:35 AM

## 2010-02-07 NOTE — Letter (Signed)
Summary: Primary Care Appointment Letter  [facility]  [address]   [place], [place] [address]   Phone: [phone]  Fax: [phone]    04/02/2007 MRN: [id number]  ISIS COSTANZA [address] [place], [place]  [address]  Dear Ms. Dorko,   Your Primary Care Physician  has indicated that:    ____X___it is time to schedule an appointment.  We have reviewed your records from [facility] and it does not appear that you have had a HgA1c or microalbumin check since 2007.  _______you missed your appointment on______ and need to call and          reschedule.    _______you need to have lab work done.    _______you need to schedule an appointment discuss lab or test results.    _______you need to call to reschedule your appointment that is                       scheduled on _________.     Please call our office as soon as possible. Our phone number is [phone]. Our office is open 8a-12noon and 1p-5p, Monday through Friday.     Thank you,    Sherrie, LPN

## 2010-02-07 NOTE — Letter (Signed)
Summary: Work [facility]  [address]   [place], [place] [address]   Phone: [phone]  Fax: [phone]    Today's Date: April 16, 2007  Name of [name]  The above named patient had a medical visit today at:  8:30 AM and she was at the office until 9:30AM..  Please take this into consideration when reviewing the time away from work/school.    Special Instructions:  [  ] None  [  ] To be off the remainder of today, returning to the normal work / school schedule tomorrow.  [  ] To be off until the next scheduled appointment on ______________________.  [  ] Other ________________________________________________________________ ________________________________________________________________________   Sincerely yours,   Erle Crocker MD

## 2010-03-18 LAB — GLUCOSE, CAPILLARY: Glucose-Capillary: 147 mg/dL — ABNORMAL HIGH (ref 70–99)

## 2010-03-26 ENCOUNTER — Encounter (INDEPENDENT_AMBULATORY_CARE_PROVIDER_SITE_OTHER): Payer: Self-pay | Admitting: *Deleted

## 2010-04-04 NOTE — Letter (Signed)
Summary: Recall Office Visit  [facility]  [address]   [place], [place] [address]   Phone: [phone]  Fax: [phone]      March 26, 2010   Becky [name] [address] [place], [place]  [address] 1960-10-23   Dear [name],   According to our records, it is time for you to schedule a follow-up office visit with [place].   At your convenience, please call [phone] to schedule an office visit. If you have any questions, concerns, or feel that this letter is in error, we would appreciate your call.   Sincerely,    Diana Eves  [facility] Ph: [phone]   Fax: [phone]

## 2010-04-16 LAB — BASIC METABOLIC PANEL
BUN: 10 mg/dL (ref 6–23)
BUN: 12 mg/dL (ref 6–23)
BUN: 9 mg/dL (ref 6–23)
CO2: 30 mEq/L (ref 19–32)
CO2: 30 mEq/L (ref 19–32)
CO2: 32 mEq/L (ref 19–32)
Calcium: 9.3 mg/dL (ref 8.4–10.5)
Calcium: 9.4 mg/dL (ref 8.4–10.5)
Calcium: 9.8 mg/dL (ref 8.4–10.5)
Chloride: 101 mEq/L (ref 96–112)
Chloride: 102 mEq/L (ref 96–112)
Chloride: 105 mEq/L (ref 96–112)
Creatinine, Ser: 0.85 mg/dL (ref 0.4–1.2)
Creatinine, Ser: 0.87 mg/dL (ref 0.4–1.2)
Creatinine, Ser: 0.92 mg/dL (ref 0.4–1.2)
GFR calc Af Amer: >60 mL/min (ref >60)
GFR calc Af Amer: >60 mL/min (ref >60)
GFR calc Af Amer: >60 mL/min (ref >60)
GFR calc non Af Amer: >60 mL/min (ref >60)
GFR calc non Af Amer: >60 mL/min (ref >60)
GFR calc non Af Amer: >60 mL/min (ref >60)
Glucose, Bld: 103 mg/dL — ABNORMAL HIGH (ref 70–99)
Glucose, Bld: 112 mg/dL — ABNORMAL HIGH (ref 70–99)
Glucose, Bld: 132 mg/dL — ABNORMAL HIGH (ref 70–99)
Potassium: 3.3 mEq/L — ABNORMAL LOW (ref 3.5–5.1)
Potassium: 3.8 mEq/L (ref 3.5–5.1)
Potassium: 4.1 mEq/L (ref 3.5–5.1)
Sodium: 137 mEq/L (ref 135–145)
Sodium: 138 mEq/L (ref 135–145)
Sodium: 138 mEq/L (ref 135–145)

## 2010-04-16 LAB — CBC
HCT: 33.8 % — ABNORMAL LOW (ref 36.0–46.0)
HCT: 36.2 % (ref 36.0–46.0)
HCT: 37 % (ref 36.0–46.0)
Hemoglobin: 12 g/dL (ref 12.0–15.0)
Hemoglobin: 12.7 g/dL (ref 12.0–15.0)
Hemoglobin: 12.9 g/dL (ref 12.0–15.0)
MCHC: 34.8 g/dL (ref 30.0–36.0)
MCHC: 35 g/dL (ref 30.0–36.0)
MCHC: 35.4 g/dL (ref 30.0–36.0)
MCV: 92.5 fL (ref 78.0–100.0)
MCV: 94 fL (ref 78.0–100.0)
MCV: 94.6 fL (ref 78.0–100.0)
Platelets: 268 10*3/uL (ref 150–400)
Platelets: 278 10*3/uL (ref 150–400)
Platelets: 298 10*3/uL (ref 150–400)
RBC: 3.65 MIL/uL — ABNORMAL LOW (ref 3.87–5.11)
RBC: 3.85 MIL/uL — ABNORMAL LOW (ref 3.87–5.11)
RBC: 3.91 MIL/uL (ref 3.87–5.11)
RDW: 13.1 % (ref 11.5–15.5)
RDW: 13.4 % (ref 11.5–15.5)
RDW: 13.4 % (ref 11.5–15.5)
WBC: 7.1 10*3/uL (ref 4.0–10.5)
WBC: 8 10*3/uL (ref 4.0–10.5)
WBC: 8.9 10*3/uL (ref 4.0–10.5)

## 2010-04-16 LAB — GLUCOSE, CAPILLARY
Glucose-Capillary: 104 mg/dL — ABNORMAL HIGH (ref 70–99)
Glucose-Capillary: 106 mg/dL — ABNORMAL HIGH (ref 70–99)
Glucose-Capillary: 108 mg/dL — ABNORMAL HIGH (ref 70–99)
Glucose-Capillary: 115 mg/dL — ABNORMAL HIGH (ref 70–99)
Glucose-Capillary: 86 mg/dL (ref 70–99)
Glucose-Capillary: 97 mg/dL (ref 70–99)

## 2010-04-16 LAB — DIFFERENTIAL
Basophils Absolute: 0 10*3/uL (ref 0.0–0.1)
Basophils Absolute: 0 10*3/uL (ref 0.0–0.1)
Basophils Absolute: 0 10*3/uL (ref 0.0–0.1)
Basophils Relative: 0 % (ref 0–1)
Basophils Relative: 1 % (ref 0–1)
Basophils Relative: 1 % (ref 0–1)
Eosinophils Absolute: 0.2 10*3/uL (ref 0.0–0.7)
Eosinophils Absolute: 0.2 10*3/uL (ref 0.0–0.7)
Eosinophils Absolute: 0.2 10*3/uL (ref 0.0–0.7)
Eosinophils Relative: 2 % (ref 0–5)
Eosinophils Relative: 2 % (ref 0–5)
Eosinophils Relative: 3 % (ref 0–5)
Lymphocytes Relative: 39 % (ref 12–46)
Lymphocytes Relative: 43 % (ref 12–46)
Lymphocytes Relative: 46 % (ref 12–46)
Lymphs Abs: 3.1 10*3/uL (ref 0.7–4.0)
Lymphs Abs: 3.2 10*3/uL (ref 0.7–4.0)
Lymphs Abs: 3.8 10*3/uL (ref 0.7–4.0)
Monocytes Absolute: 0.4 10*3/uL (ref 0.1–1.0)
Monocytes Absolute: 0.6 10*3/uL (ref 0.1–1.0)
Monocytes Absolute: 0.7 10*3/uL (ref 0.1–1.0)
Monocytes Relative: 5 % (ref 3–12)
Monocytes Relative: 8 % (ref 3–12)
Monocytes Relative: 8 % (ref 3–12)
Neutro Abs: 3 10*3/uL (ref 1.7–7.7)
Neutro Abs: 4.1 10*3/uL (ref 1.7–7.7)
Neutro Abs: 4.4 10*3/uL (ref 1.7–7.7)
Neutrophils Relative %: 43 % (ref 43–77)
Neutrophils Relative %: 50 % (ref 43–77)
Neutrophils Relative %: 51 % (ref 43–77)

## 2010-04-16 LAB — LIPID PANEL
Cholesterol: 246 mg/dL — ABNORMAL HIGH (ref 0–200)
HDL: 52 mg/dL (ref >39)
LDL Cholesterol: 178 mg/dL — ABNORMAL HIGH (ref 0–99)
Total CHOL/HDL Ratio: 4.7 RATIO
Triglycerides: 82 mg/dL (ref <150)
VLDL: 16 mg/dL (ref 0–40)

## 2010-04-16 LAB — POCT CARDIAC MARKERS
CKMB, poc: <1 ng/mL — ABNORMAL LOW (ref 1.0–8.0)
CKMB, poc: <1 ng/mL — ABNORMAL LOW (ref 1.0–8.0)
Myoglobin, poc: 43.1 ng/mL (ref 12–200)
Myoglobin, poc: 43.6 ng/mL (ref 12–200)
Troponin i, poc: <0.05 ng/mL (ref 0.00–0.09)
Troponin i, poc: <0.05 ng/mL (ref 0.00–0.09)

## 2010-04-16 LAB — CARDIAC PANEL(CRET KIN+CKTOT+MB+TROPI)
CK, MB: 1.3 ng/mL (ref 0.3–4.0)
Relative Index: INVALID (ref 0.0–2.5)
Total CK: 96 U/L (ref 7–177)
Troponin I: 0.02 ng/mL (ref 0.00–0.06)

## 2010-04-16 LAB — MAGNESIUM: Magnesium: 2 mg/dL (ref 1.5–2.5)

## 2010-04-16 LAB — PHOSPHORUS: Phosphorus: 4.4 mg/dL (ref 2.3–4.6)

## 2010-04-16 LAB — TSH: TSH: 3.334 u[IU]/mL (ref 0.350–4.500)

## 2010-05-21 NOTE — Discharge Summary (Signed)
Becky [name], Becky [name] NO.:  [id number]   MEDICAL RECORD NO.:  [id number]          PATIENT TYPE:  INP   LOCATION:  A318                          FACILITY:  [facility]   PHYSICIAN:  Dorris Singh, DO    DATE OF BIRTH:  29-Sep-1960   DATE OF ADMISSION:  05/28/2008  DATE OF DISCHARGE:  05/25/2010LH                               DISCHARGE SUMMARY   PRIMARY CARE PHYSICIAN:  Dr. Patrica Duel   CONSULTATIONS:  [facility].   ADMISSION DIAGNOSES:  1. Chest pain.  2. Hypertension.  3. Diabetes.  4. Hyperlipidemia.   DISCHARGE DIAGNOSES:  1. Chest pain resolved.  2. Hypertension.  3. Diabetes.  4. Hyperlipidemia.  5. Nausea.  6. Gastroesophageal reflux disease.   Her testing that was done includes:  A portable chest x-ray which shows  no acute abnormalities.   HOSPITAL COURSE:  The patient is a 50 year old African female who was  admitted with the above diagnoses.  She has multiple risk factors.  She  was seen by [facility] who believed that this possibly  could be due to a GI etiology and they felt that this probably is not  due to any cardiac/cardiovascular etiology.  Dr. Mikeal Hawthorne was supposed to  talk to the patient regarding any GI evaluation.  However, I do not  think this was done so we will go ahead and discharge her to home with  follow up with her primary care physician and they can send her to  outpatient GI workup regarding that she does have a history of  esophageal stricture in the past.  However, this does not account for  some of her other symptomatology.  I have recommend that they set her up  for cardiology after that.  It was also recommended that she be put on a  PPI from Cardiology as well.  Her vitals today are stable, blood  pressure is stable, and she wanted to go home today, so we are sending  her home.  All labs are within normal limits.  Her medications that she  was sent home on include:  1. Lisinopril HCTZ 12.5  daily.  2. Levothyroxine 88 mg daily.  3. Ibuprofen.  We recommend that she not use that.  4. Hydrocodone as needed.  5. Lasix 20 mg.  6. Protonix was ordered but omeprazole was given from the pharmacy 20      mg p.o. daily.  7. I told the patient to take Tylenol instead of Motrin.  8. I gave her Phenergan 12.5 p.o. q.6-8.   I also recommended she consult [facility] for outpatient  stress test at this point in time, per recommendations of [facility]  due to her risk factors.  As mentioned before, she needs to follow up  with outpatient GI if these symptoms continue.      Dorris Singh, DO  Electronically Signed     CB/MEDQ  D:  05/30/2008  T:  05/30/2008  Job:  [id number]

## 2010-05-21 NOTE — Consult Note (Signed)
[name], Becky [name] NO.:  [id number]   MEDICAL RECORD NO.:  [id number]          PATIENT TYPE:  INP   LOCATION:  A318                          FACILITY:  [facility]   PHYSICIAN:  Vonna Kotyk R. Jacinto Halim, MD       DATE OF BIRTH:  December 02, 1960   DATE OF CONSULTATION:  DATE OF DISCHARGE:                                 CONSULTATION   REASON FOR CONSULTATION:  Chest pain in a patient with multiple  cardiovascular risks.  Please see valid cardiac risks and cardiac risk  stratification for chest pain.   HISTORY:  Ms. Becky [name] is a pleasant 50 year old African American  female with a history of negative stress testing 4-5 years ago at  [facility], was seen by my partner Dr. Cherly Anderson.  She had been doing well until recently.  About 3 days ago,  she had noticed heaviness in the middle of her chest.  This discomfort  lasted for several hours.  She took some aspirin and also she took  muscle relaxants and felt better.  She describes this as feeling  discomfort in the left arm.   On further questioning, she stated that she has been having these  episodes of this chest discomfort over several weeks to several months.  However, because the intensity was severe 3 days ago, she presented to  the emergency room yesterday.   Since being admitted to the hospital, she has not had any recurrence,  but she describes discomfort as heaviness to sharp pain in the middle of  her chest.  Mostly this is exacerbated by eating.  She feels fullness in  her throat after eating.  She has noticed that drinking some baking soda  in the water does help her to relieve the chest discomfort.   REVIEW OF SYSTEMS:  She has severe symptoms of obstructive sleep apnea  with daytime somnolence, snoring.  No witnessed apneic episodes.  Her  daughter is present at the bedside.  She is a diabetic and she is diet  controlled.  She has no TIA.  No neurological deficits.  No symptoms  suggestive of claudication.  Other systems are negative.   PRESENT MEDICATIONS:  1. Lisinopril 10 mg p.o. daily.  2. Hydrochlorothiazide 12.5 mg p.o. daily.  3. Levothyroxine 88 mcg p.o. daily.  4. Aspirin 81 mg p.o. daily.  5. Protonix 40 mg p.o. daily.  6. Docusate 1 mg p.o. at bedtime.  In the hospital, she has been      placed on sliding scale insulin and Lovenox for DVT prophylaxis.   ALLERGIES:  No known drug allergies.   PAST MEDICAL HISTORY:  Significant for,  1. Hypertension.  2. Hyperlipidemia.  3. Diet-controlled diabetes since age 30.  4. Hypothyroidism.  5. Peptic ulcer disease without any obvious GI bleed.  6. Esophageal dilatation about 6-8 years ago, details not known.   She has had right tibial fracture with internal fixation done in 2005,  bilateral tubal ligation in 2000, exploratory laparotomy in 1990s.   FAMILY HISTORY:  There is history of premature coronary artery disease  in the  family.  Mother died at age of 48 with myocardial infarction and  also complications of hypertension and diabetes.  Father died at the age  of 17 with coronary artery disease and heart attack.  A brother of her  had congestive heart failure and appears to have had hypertensive heart  disease and died at the age of 31.   SOCIAL HISTORY:  She does not consume alcohol, does not use any tobacco  products.  She has a sedentary lifestyle.  No history of illicit drug  abuse.   PHYSICAL EXAMINATION:  GENERAL:  She is moderately built and obese.  She  appears to be in no acute distress.  VITAL SIGNS:  Temperature of 98.3; pulse is 58 beats per minute,  regular; respirations 16; blood pressure 115/68 mmHg.  CARDIAC:  S1, S2 was normal without any gallops or murmur.  CHEST:  Clear.  ABDOMEN:  Soft, nontender.  Bowel sounds heard in all 4 quadrants.  EXTREMITIES:  Warm, nontender.  Moves all 4 extremities.  No edema.  Peripheral arterial exam was normal.   Her EKG demonstrates normal  sinus rhythm.  Her cardiac markers have been  negative for myocardial ischemia or infarct.  BMP was within normal  limits.  Blood sugar was slightly elevated at 112.   IMPRESSION:  1. Chest pain.  The patient with multiple cardiovascular risk and also      family history of premature coronary artery disease.  By history,      she appears to have had a stress test about 4-5 years ago, which      was negative for myocardial ischemia.  Her chest pain is more      consistent severe gastroesophageal reflux disease and I suspect      that she probably has achalasia cardia where she feels food gets      stuck  in her throat, especially after eating.  2. Hypertension, controlled.  3. Hyperlipidemia, being managed by Dr. Patrica Duel.  4. Obesity with sleep apnea.   RECOMMENDATIONS:  Her main issue today with chest discomfort is  evaluation for GI etiology, especially achalasia cardia or esophageal  stricture.  She has had esophageal stricture dilatation done in the  past.  At this point, she has no active GI bleeding and she is stable  from hemoglobin and also she is not having any active abdominal  discomfort, neither has she had any significant chest discomfort since  being in the hospital.  Her cardiac markers are completely negative,  hence I feel safe enough for her to be discharged home on the present  medical therapy.  From cardiovascular standpoint, however I have  discussed with Dr. Lonia Blood, to see whether GI evaluation needs to be  performed either now or in the outpatient basis.  I will leave it to him  to decide on this.   As far as cardiac issues are concerned, she does need cardiac risk  stratification.  I am going to set her up for a outpatient stress  Myoview unless extensive GI workup is indicated, then we will perform it  on a inpatient basis.  Her main issue is GI issue again.   She does have significant sleep apnea, needs a sleep study.   I have discussed with the  patient extensively in presence of her  daughter regarding especially the calorie restriction, weight loss will  help significantly in her diet-control diabetes and also to abate the  symptoms of obstructive sleep apnea.  I thank Dr. Lonia Blood for having requested to see and be involved in  her care.  Do not hesitate to contact if you have any further questions.      Cristy Hilts. Jacinto Halim, MD  Electronically Signed     JRG/MEDQ  D:  05/29/2008  T:  05/30/2008  Job:  [id number]   cc:   Patrica Duel, M.D.  Fax: [phone]

## 2010-05-21 NOTE — H&P (Signed)
Becky [name], Becky [name] NO.:  [id number]   MEDICAL RECORD NO.:  [id number]          PATIENT TYPE:  INP   LOCATION:  A318                          FACILITY:  [facility]   PHYSICIAN:  Skeet Latch, DO    DATE OF BIRTH:  06-Jul-1960   DATE OF ADMISSION:  05/28/2008  DATE OF DISCHARGE:  LH                              HISTORY & PHYSICAL   PRIMARY CARE PHYSICIAN:  Patrica Duel, M.D.   CHIEF COMPLAINT:  Chest pain.   HISTORY OF PRESENT ILLNESS:  This is a 50 year old female who presents  complaining of chest discomfort.  The patient states that the chest pain  started today, acute in onset, persistent in nature. The patient states  that it is located in the left side of her chest, with some radiation to  the left upper arm.  She describes it as an aching type sensation.  She  states that at its worst, it is a 7/10.  The patient was given  medications and now she states it is 4/10.  It is aggravated by nothing,  relieved by nothing also.  There are no associated chills, fevers,  nausea and vomiting.   PAST MEDICAL HISTORY:  1. Hypertension.  2. Diabetes.  3. Hyperlipidemia.  4. Hypothyroidism.   FAMILY HISTORY:  Positive for hypertension and coronary artery disease.   PAST SURGICAL HISTORY:  Right fallopian tube was removed.   SOCIAL HISTORY:  Denies smoking, illicit drug use or alcohol abuse.   ALLERGIES:  No known drug allergies.   REVIEW OF SYSTEMS:  CONSTITUTIONAL:  No fever, chills, weight gain or  weight loss.  HEENT:  Unremarkable.  CARDIOVASCULAR:  Positive for chest  pain.  No palpitations.  RESPIRATORY:  No cough, dyspnea or wheezing.  GASTROINTESTINAL:  No nausea, vomiting, diarrhea.  GENITOURINARY:  Unremarkable.  MUSCULOSKELETAL:  She does have some left sided arm pain.  SKIN:  Unremarkable.  NEUROLOGIC: Unremarkable.  PSYCHIATRIC:  Unremarkable.  Other systems unremarkable.   PHYSICAL EXAMINATION:  GENERAL:  She is well-developed, well-nourished,  well-hydrated, in no acute distress.  HEENT:  Head is normocephalic, atraumatic.  Eyes:  PERRLA, EOMI.  No  scleral icterus is noted.  NECK:  Soft, supple, nontender and nondistended.  CARDIOVASCULAR:  Regular rate and rhythm.  No murmurs, rubs or gallops.  LUNGS:  Clear to auscultation bilaterally.  No rales, rhonchi or  wheezing.  ABDOMEN:  Soft, flat, nontender, nondistended.  No masses.  Bowel sounds  present.  No rigidity or guarding.  EXTREMITIES:  No clubbing, cyanosis, or edema.  NEUROLOGIC:  Cranial nerves II through XII are grossly intact.  Patient  is alert and oriented x3.  SKIN:  Warm and dry, good turgor.   EKG showed 62 bpm, normal sinus rhythm, normal axis, normal QRS.   LABORATORY DATA:  CK-MB less than 1, troponin less than 0.05, myoglobin  43.  Sodium 138, potassium 3.3, chloride of 101, CO2 32, glucose 132,  BUN 9, creatinine 0.85.  White count is 8.9, hemoglobin 12.9, hematocrit  of 37.0, platelet count 298,000.   Chest x-ray shows no acute abnormalities.   ASSESSMENT:  This is a 50 year old who had acute onset of left sided  chest pain with radiation to her left arm.  The patient has strong  comorbidities including hypertension, diabetes and hyperlipidemia.   PLAN:  1. The patient will be admitted to the service of [facility]      at this time.  2. Cardiology will be consulted for further evaluation.  The patient      will be placed on pain medications, oxygen, aspirin, and      nitroglycerin as needed for chest discomfort.  Will get repeat EKG      in the a.m.  The patient will get another set of cardiac enzymes      for a total of 3 sets.  So far, her enzymes are unremarkable.  Will      defer to cardiology regarding patient need for stress test at this      time.  3. For her diabetes, patient will be placed on her home medications as      well as sliding scale with blood sugars checked a.c. and h.s.  4. For her hypertension and hyperlipidemia,  the patient will also be      placed on her home medications.  Will get a lipid panel added to      her a.m. labs at this time.  5. The patient will be placed on deep vein thrombosis and      gastrointestinal prophylaxis.      Skeet Latch, DO  Electronically Signed     SM/MEDQ  D:  05/29/2008  T:  05/29/2008  Job:  [id number]   cc:   Patrica Duel, M.D.  Fax: [phone]

## 2010-05-24 NOTE — Procedures (Signed)
NAME:  Becky [name], Becky [name]                     ACCOUNT NO.:  [id number]   MEDICAL RECORD NO.:  [id number]                   PATIENT TYPE:  OUT   LOCATION:  RAD                                  FACILITY:  [facility]   PHYSICIAN:  Kem Boroughs, M.D.                 DATE OF BIRTH:  [birth year]   DATE OF PROCEDURE:  06/28/2002  DATE OF DISCHARGE:                                  ECHOCARDIOGRAM   REFERRING PHYSICIANS:  Corrie Mckusick, M.D. and Kem Boroughs, M.D.   PROCEDURE:  Echocardiogram   INDICATIONS:  [name] is a 50 year old female who was found to have a  systolic ejection murmur.   TECHNICAL QUALITY:  The technical quality of this study is adequate.   FINDINGS:  1. The aorta is within normal limits at 2.5 cm.  2. The left atrium is mildly dilated at 4.2 cm.  No obvious clots or masses     were appreciated.  The patient appeared to be in sinus rhythm during this     procedure.  3. The interventricular septum and posterior wall were within normal limits     and thickness at 1.1 cm for each.  4. The aortic valve was trileaflet and pliable with good leaflet excursion.     Minimal calcification is noted on the left coronary cusp.  Doppler     interrogation is consistent with mild aortic sclerosis but no stenosis.  5. The mitral valve is structurally normal.  No mitral valve prolapse is     noted.  Mild mitral annular calcification is noted.  Trivial mitral     regurgitation is noted.  Doppler interrogation of the mitral valve was     within normal limits.  6. The pulmonic valve is incompletely visualized, but appears grossly     structurally normal.  7. The tricuspid valve also appears grossly structurally normal.  No     significant tricuspid regurgitation is noted.  8. The left ventricle was normal in size with the LVIDD measured at 4.7 cm     and the LVISD measured at 4.2 cm.  Overall left ventricular systolic     function is normal. In 1 view only (apical 2-chamber), the  mid-to-distal     inferior wall appears to be hypokinetic.  This is not confirmed in any     other view.  Overall ejection fraction is normal as stated above.  There     is no evidence for diastolic dysfunction.  The right atrium and the right     ventricle appeared normal in size; and right ventricular systolic     function appears normal.  The IVC is normal in size with good collapse.   IMPRESSION:  1. Mild left atrial enlargement.  2. Minimal aortic sclerosis without stenosis.  3. No mitral valve prolapse.  4. Mild mitral annular calcification.  5. Trivial mitral regurgitation.  6. Normal left ventricular chamber size  with normal overall systolic     function and normal overall ejection fraction.  7.     In one view only (the apical 2 chamber) the mid-to-distal inferior wall     appears hypokinetic.  This is not confirmed in any other view; and, as     stated above, the overall ejection fraction is entirely normal.  8. Mild mitral annular calcification.                                               Kem Boroughs, M.D.    TB/MEDQ  D:  06/28/2002  T:  06/29/2002  Job:  [id number]   cc:   Corrie Mckusick, M.D.  [address] [place]. [place]  [place]   [phone]  Fax: [phone]
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
## 2010-05-24 NOTE — Op Note (Signed)
NAME:  Becky [name], Becky [name]                     ACCOUNT NO.:  [id number]   MEDICAL RECORD NO.:  [id number]                   PATIENT TYPE:  INP   LOCATION:  5010                                 FACILITY:  [facility]   PHYSICIAN:  John L. Rendall III, M.D.           DATE OF BIRTH:  1960-01-21   DATE OF PROCEDURE:  03/20/2003  DATE OF DISCHARGE:                                 OPERATIVE REPORT   PREOPERATIVE DIAGNOSIS:  Comminuted split depression lateral tibial plateau  fracture involving medial metaphysis as well.   SURGICAL PROCEDURE:  Arthroscopically-assisted open reduction and internal  fixation, split depression lateral tibial plateau fracture, with bone graft.   POSTOPERATIVE DIAGNOSIS:  Comminuted split depression lateral tibial plateau  fracture involving medial metaphysis as well.   SURGEON:  John L. Priscille Kluver, M.D.   ASSISTANT:  Jamelle Rushing, P.A.   ANESTHESIA:  General.   PATHOLOGY:  The patient has a lateral plateau split depression with widening  of 2 cm and a 1 cm fragment driven 2 cm below the joint line and a second 1  cm fragment rotated 90 degrees.   DESCRIPTION OF PROCEDURE:  Under general anesthesia, the right leg is  prepared with Betadine and draped as a sterile field in a leg holder with a  proximal thigh tourniquet.  The leg is wrapped out with the Esmarch and the  tourniquet is used at 350 mm.  Standard arthroscopic portals are made and  hemarthrosis is washed out.  The split depression is readily seen in the  lateral compartment.  The meniscus is intact.  The ACL is intact.  The  medial meniscus is intact.  There is minor scuffing of articular surfaces on  the femur where bone fragments rubbed.  Attention is then turned to  elevating the displaced fragments.  A secondary incision is made along the  lateral tibial metaphysis.  A joker elevator is inserted into the fracture,  and the depressed and rotated fragments are freed up and elevated.  Once  these  were elevated, two Synthes screws were placed from laterally medially  with washer, 7.3 lag screws.  Two front-to-back lag screws were then  inserted in the lateral compartment to support the bone fragments in that  manner.  After changing the screws a couple of times to get the right length  and so forth, excellent position and alignment are obtained on both  radiographic and arthroscopic pictures with only 2-3 mm step-off seen at the  arthroscopic picture and almost no step-off seen on the radiographic  picture.  With this considered to be good to excellent from where we  started, bone graft is packed into the lateral tibial metaphysis.  Subcu  wound there is closed with Vicryl, skin with clips.  The patient is placed  in a CPM machine 0-40 degrees and will be nonweightbearing for a month, plan  to use a CPM machine for the next month.  The patient  tolerated the  procedure well and returned to recovery in good condition.  Tourniquet time  1 hour 30 minutes.                                               John L. Dorothyann Gibbs, M.D.    Renato Gails  D:  03/20/2003  T:  03/21/2003  Job:  [id number]

## 2010-08-21 ENCOUNTER — Ambulatory Visit (HOSPITAL_COMMUNITY)
Admission: RE | Admit: 2010-08-21 | Discharge: 2010-08-21 | Disposition: A | Discharge status: Home / Self Care | Payer: BC Managed Care – PPO | Source: Ambulatory Visit | Attending: Physical Medicine and Rehabilitation | Admitting: Physical Medicine and Rehabilitation

## 2010-08-21 ENCOUNTER — Other Ambulatory Visit (HOSPITAL_COMMUNITY): Payer: Self-pay | Admitting: Physical Medicine and Rehabilitation

## 2010-08-21 DIAGNOSIS — M25539 Pain in unspecified wrist: Secondary | ICD-10-CM

## 2010-08-21 DIAGNOSIS — M778 Other enthesopathies, not elsewhere classified: Secondary | ICD-10-CM

## 2010-08-21 DIAGNOSIS — M25549 Pain in joints of unspecified hand: Secondary | ICD-10-CM | POA: Insufficient documentation

## 2010-09-12 ENCOUNTER — Ambulatory Visit: Payer: Self-pay | Admitting: Orthopedic Surgery

## 2010-09-12 ENCOUNTER — Encounter: Payer: Self-pay | Admitting: Orthopedic Surgery

## 2010-10-02 ENCOUNTER — Ambulatory Visit (HOSPITAL_COMMUNITY)
Admission: RE | Admit: 2010-10-02 | Discharge: 2010-10-02 | Disposition: A | Discharge status: Home / Self Care | Payer: BC Managed Care – PPO | Source: Ambulatory Visit | Attending: Physical Medicine and Rehabilitation | Admitting: Physical Medicine and Rehabilitation

## 2010-10-02 ENCOUNTER — Other Ambulatory Visit (HOSPITAL_COMMUNITY): Payer: Self-pay | Admitting: Physical Medicine and Rehabilitation

## 2010-10-02 DIAGNOSIS — M25569 Pain in unspecified knee: Secondary | ICD-10-CM | POA: Insufficient documentation

## 2010-10-02 DIAGNOSIS — M79606 Pain in leg, unspecified: Secondary | ICD-10-CM

## 2010-10-02 DIAGNOSIS — IMO0002 Reserved for concepts with insufficient information to code with codable children: Secondary | ICD-10-CM | POA: Insufficient documentation

## 2010-10-02 DIAGNOSIS — M1710 Unilateral primary osteoarthritis, unspecified knee: Secondary | ICD-10-CM | POA: Insufficient documentation

## 2011-01-16 ENCOUNTER — Other Ambulatory Visit: Payer: Self-pay | Admitting: Physical Medicine and Rehabilitation

## 2011-01-16 DIAGNOSIS — M5412 Radiculopathy, cervical region: Secondary | ICD-10-CM

## 2011-01-16 DIAGNOSIS — M542 Cervicalgia: Secondary | ICD-10-CM

## 2011-01-16 DIAGNOSIS — M5030 Other cervical disc degeneration, unspecified cervical region: Secondary | ICD-10-CM

## 2011-01-16 DIAGNOSIS — M4802 Spinal stenosis, cervical region: Secondary | ICD-10-CM

## 2011-01-18 ENCOUNTER — Ambulatory Visit
Admission: RE | Admit: 2011-01-18 | Discharge: 2011-01-18 | Disposition: A | Discharge status: Home / Self Care | Payer: BC Managed Care – PPO | Source: Ambulatory Visit | Attending: Physical Medicine and Rehabilitation | Admitting: Physical Medicine and Rehabilitation

## 2011-01-18 DIAGNOSIS — M4802 Spinal stenosis, cervical region: Secondary | ICD-10-CM

## 2011-01-18 DIAGNOSIS — M5030 Other cervical disc degeneration, unspecified cervical region: Secondary | ICD-10-CM

## 2011-01-18 DIAGNOSIS — M542 Cervicalgia: Secondary | ICD-10-CM

## 2011-01-18 DIAGNOSIS — M5412 Radiculopathy, cervical region: Secondary | ICD-10-CM

## 2011-03-22 ENCOUNTER — Other Ambulatory Visit: Payer: Self-pay | Admitting: Obstetrics and Gynecology

## 2011-06-25 ENCOUNTER — Other Ambulatory Visit: Payer: Self-pay | Admitting: Obstetrics and Gynecology

## 2011-06-25 DIAGNOSIS — Z139 Encounter for screening, unspecified: Secondary | ICD-10-CM

## 2011-06-27 ENCOUNTER — Ambulatory Visit (HOSPITAL_COMMUNITY)
Admission: RE | Admit: 2011-06-27 | Discharge: 2011-06-27 | Disposition: A | Discharge status: Home / Self Care | Payer: BC Managed Care – PPO | Source: Ambulatory Visit | Attending: Obstetrics and Gynecology | Admitting: Obstetrics and Gynecology

## 2011-06-27 DIAGNOSIS — Z139 Encounter for screening, unspecified: Secondary | ICD-10-CM

## 2011-06-27 DIAGNOSIS — Z1231 Encounter for screening mammogram for malignant neoplasm of breast: Secondary | ICD-10-CM | POA: Insufficient documentation

## 2011-07-07 ENCOUNTER — Encounter (HOSPITAL_COMMUNITY): Payer: Self-pay | Admitting: *Deleted

## 2011-07-07 ENCOUNTER — Emergency Department (HOSPITAL_COMMUNITY)
Admission: EM | Admit: 2011-07-07 | Discharge: 2011-07-07 | Disposition: A | Discharge status: Home / Self Care | Payer: BC Managed Care – PPO | Attending: Emergency Medicine | Admitting: Emergency Medicine

## 2011-07-07 ENCOUNTER — Emergency Department (HOSPITAL_COMMUNITY): Payer: BC Managed Care – PPO

## 2011-07-07 DIAGNOSIS — K7689 Other specified diseases of liver: Secondary | ICD-10-CM | POA: Insufficient documentation

## 2011-07-07 DIAGNOSIS — I10 Essential (primary) hypertension: Secondary | ICD-10-CM | POA: Insufficient documentation

## 2011-07-07 DIAGNOSIS — R0789 Other chest pain: Secondary | ICD-10-CM

## 2011-07-07 DIAGNOSIS — M549 Dorsalgia, unspecified: Secondary | ICD-10-CM

## 2011-07-07 DIAGNOSIS — R911 Solitary pulmonary nodule: Secondary | ICD-10-CM

## 2011-07-07 DIAGNOSIS — R079 Chest pain, unspecified: Secondary | ICD-10-CM | POA: Insufficient documentation

## 2011-07-07 DIAGNOSIS — R071 Chest pain on breathing: Secondary | ICD-10-CM | POA: Insufficient documentation

## 2011-07-07 HISTORY — DX: Disorder of thyroid, unspecified: E07.9

## 2011-07-07 HISTORY — DX: Essential (primary) hypertension: I10

## 2011-07-07 LAB — BASIC METABOLIC PANEL
BUN: 10 mg/dL (ref 6–23)
CO2: 29 mEq/L (ref 19–32)
Calcium: 10.3 mg/dL (ref 8.4–10.5)
Chloride: 99 mEq/L (ref 96–112)
Creatinine, Ser: 0.97 mg/dL (ref 0.50–1.10)
GFR calc Af Amer: 78 mL/min — ABNORMAL LOW (ref >90)
GFR calc non Af Amer: 67 mL/min — ABNORMAL LOW (ref >90)
Glucose, Bld: 101 mg/dL — ABNORMAL HIGH (ref 70–99)
Potassium: 3.2 mEq/L — ABNORMAL LOW (ref 3.5–5.1)
Sodium: 138 mEq/L (ref 135–145)

## 2011-07-07 LAB — CBC WITH DIFFERENTIAL/PLATELET
Basophils Absolute: 0 10*3/uL (ref 0.0–0.1)
Basophils Relative: 0 % (ref 0–1)
Eosinophils Absolute: 0.2 10*3/uL (ref 0.0–0.7)
Eosinophils Relative: 2 % (ref 0–5)
HCT: 38.8 % (ref 36.0–46.0)
Hemoglobin: 13.2 g/dL (ref 12.0–15.0)
Lymphocytes Relative: 39 % (ref 12–46)
Lymphs Abs: 3.2 10*3/uL (ref 0.7–4.0)
MCH: 31 pg (ref 26.0–34.0)
MCHC: 34 g/dL (ref 30.0–36.0)
MCV: 91.1 fL (ref 78.0–100.0)
Monocytes Absolute: 0.8 10*3/uL (ref 0.1–1.0)
Monocytes Relative: 9 % (ref 3–12)
Neutro Abs: 4 10*3/uL (ref 1.7–7.7)
Neutrophils Relative %: 50 % (ref 43–77)
Platelets: 302 10*3/uL (ref 150–400)
RBC: 4.26 MIL/uL (ref 3.87–5.11)
RDW: 13.1 % (ref 11.5–15.5)
WBC: 8.2 10*3/uL (ref 4.0–10.5)

## 2011-07-07 LAB — URINALYSIS, ROUTINE W REFLEX MICROSCOPIC
Bilirubin Urine: NEGATIVE
Glucose, UA: NEGATIVE mg/dL
Ketones, ur: NEGATIVE mg/dL
Nitrite: NEGATIVE
Protein, ur: NEGATIVE mg/dL
Specific Gravity, Urine: <1.005 — ABNORMAL LOW (ref 1.005–1.030)
Urobilinogen, UA: 0.2 mg/dL (ref 0.0–1.0)
pH: 6 (ref 5.0–8.0)

## 2011-07-07 LAB — URINE MICROSCOPIC-ADD ON

## 2011-07-07 LAB — TROPONIN I: Troponin I: <0.3 ng/mL (ref <0.30)

## 2011-07-07 LAB — D-DIMER, QUANTITATIVE: D-Dimer, Quant: 0.77 ug/mL-FEU — ABNORMAL HIGH (ref 0.00–0.48)

## 2011-07-07 MED ORDER — HYDROMORPHONE HCL PF 1 MG/ML IJ SOLN
1.0000 mg | Freq: Once | INTRAMUSCULAR | Status: AC
  Administered 2011-07-07: 1 mg via INTRAVENOUS
  Filled 2011-07-07: qty 1

## 2011-07-07 MED ORDER — CYCLOBENZAPRINE HCL 10 MG PO TABS: 10.0000 mg | ORAL_TABLET | Freq: Two times a day (BID) | ORAL | Status: AC | PRN

## 2011-07-07 MED ORDER — HYDROCODONE-ACETAMINOPHEN 5-325 MG PO TABS
1.0000 | ORAL_TABLET | Freq: Four times a day (QID) | ORAL | Status: AC | PRN
Start: 2011-07-07 — End: 2011-07-17

## 2011-07-07 MED ORDER — ONDANSETRON HCL 4 MG/2ML IJ SOLN
4.0000 mg | Freq: Once | INTRAMUSCULAR | Status: AC
  Administered 2011-07-07: 4 mg via INTRAVENOUS
  Filled 2011-07-07: qty 2

## 2011-07-07 MED ORDER — IOHEXOL 350 MG/ML SOLN
100.0000 mL | Freq: Once | INTRAVENOUS | Status: AC | PRN
  Administered 2011-07-07: 100 mL via INTRAVENOUS

## 2011-07-07 MED ORDER — SODIUM CHLORIDE 0.9 % IV SOLN: INTRAVENOUS | Status: DC

## 2011-07-07 MED ORDER — PROMETHAZINE HCL 25 MG PO TABS: 25.0000 mg | ORAL_TABLET | Freq: Four times a day (QID) | ORAL | Status: DC | PRN

## 2011-07-07 MED ORDER — SODIUM CHLORIDE 0.9 % IV BOLUS (SEPSIS)
250.0000 mL | Freq: Once | INTRAVENOUS | Status: AC
  Administered 2011-07-07: 250 mL via INTRAVENOUS

## 2011-07-07 MED ORDER — NAPROXEN 500 MG PO TABS: 500.0000 mg | ORAL_TABLET | Freq: Two times a day (BID) | ORAL | Status: AC

## 2011-07-07 NOTE — ED Provider Notes (Addendum)
History   This chart was scribed for Shelda Jakes, MD by Melba Coon. The patient was seen in room APA05/APA05 and the patient's care was started at 8:19PM.    CSN: [id number]  Arrival date & time 07/07/11  1909   First MD Initiated Contact with Patient 07/07/11 1945      Chief Complaint  Patient presents with  . Back Pain    (Consider location/radiation/quality/duration/timing/severity/associated sxs/prior treatment) HPI Becky [name] is a 51 y.o. female who presents to the Emergency Department complaining of intermittent, moderate to severe lower lumbar back pain with an onset 3 days ago. Pt also c/o associated bilateral lateral chest pain today. Back pain radiates to bilateral upper part of lower extremities. Burping relieved the chest pain. Deep inhalation aggravates the back and chest pain. Pt had CP for 40 min this morning. No falls or injury to affected areas. HA, slight abd pain, bilateral ankle edema present. No cough, fever, neck pain, sore throat, rash, SOB, n/v/d, dysuria, or extremity pain, weakness, numbness, or tingling. Pt takes ASA. Pt does not take coumadin. No known allergies. No other pertinent medical symptoms.  PCP: Dr. Charletta Cousin  Past Medical History  Diagnosis Date  . Hypertension   . Thyroid disease     Past Surgical History  Procedure Date  . Ectopic pregnancy surgery     Family History  Problem Relation Age of Onset  . Heart failure Mother   . Hypertension Mother   . Heart failure Father   . Hypertension Father   . Heart failure Brother     History  Substance Use Topics  . Smoking status: Former Games developer  . Smokeless tobacco: Not on file  . Alcohol Use: No    OB History    Grav Para Term Preterm Abortions TAB SAB Ect Mult Living                  Review of Systems 10 Systems reviewed and all are negative for acute change except as noted in the HPI.   Allergies  Review of patient's allergies indicates no known  allergies.  Home Medications   Current Outpatient Rx  Name Route Sig Dispense Refill  . ASPIRIN EC 81 MG PO TBEC Oral Take 81 mg by mouth daily.    . CRESTOR 10 MG PO TABS Oral Take 10 mg by mouth at bedtime.    Marland Kitchen FLUTICASONE PROPIONATE 50 MCG/ACT NA SUSP Nasal Place 2 sprays into the nose daily.    . FUROSEMIDE 20 MG PO TABS Oral Take 20 mg by mouth every morning.    Marland Kitchen KLOR-CON M20 20 MEQ PO TBCR Oral Take 20 mEq by mouth Daily.    Marland Kitchen LEVOTHYROXINE SODIUM 88 MCG PO TABS Oral Take 88 mcg by mouth every morning.    Marland Kitchen NEXIUM 40 MG PO CPDR Oral Take 40 mg by mouth every morning.    . CYCLOBENZAPRINE HCL 10 MG PO TABS Oral Take 1 tablet (10 mg total) by mouth 2 (two) times daily as needed for muscle spasms. 20 tablet 0  . HYDROCODONE-ACETAMINOPHEN 5-325 MG PO TABS Oral Take 1-2 tablets by mouth every 6 (six) hours as needed for pain. 14 tablet 0  . NAPROXEN 500 MG PO TABS Oral Take 1 tablet (500 mg total) by mouth 2 (two) times daily. 14 tablet 0    BP 149/90  Pulse 87  Temp 98.3 F (36.8 C) (Oral)  Ht 5\' 7"  (1.702 m)  Wt 245 lb (111.131 kg)  BMI 38.37 kg/m2  SpO2 100%  Physical Exam  Nursing note and vitals reviewed. Constitutional: She is oriented to person, place, and time. She appears well-developed and well-nourished. No distress.  HENT:  Head: Normocephalic and atraumatic.  Right Ear: External ear normal.  Left Ear: External ear normal.  Eyes: EOM are normal.  Neck: Normal range of motion. Neck supple. No tracheal deviation present.  Cardiovascular: Normal rate, regular rhythm and normal heart sounds.   No murmur heard. Pulmonary/Chest: Effort normal and breath sounds normal. No respiratory distress. She has no wheezes. She has no rales.  Abdominal: Soft. Bowel sounds are normal. There is no tenderness. There is no rebound and no guarding.  Musculoskeletal: Normal range of motion. She exhibits no edema and no tenderness (no CVA tenderness).  Lymphadenopathy:    She has no  cervical adenopathy.  Neurological: She is alert and oriented to person, place, and time. No cranial nerve deficit. Coordination normal.  Skin: Skin is warm and dry. No rash noted.  Psychiatric: She has a normal mood and affect. Her behavior is normal.    ED Course  Procedures (including critical care time)  DIAGNOSTIC STUDIES: Oxygen Saturation is 100% on room air, normal by my interpretation.    COORDINATION OF CARE:  8:26PM - EDMD will order IV fluids, zofran, dilaudid, CXR, blood w/u, and UA for the pt.  Results for orders placed during the hospital encounter of 07/07/11  URINALYSIS, ROUTINE W REFLEX MICROSCOPIC      Component Value Range   Color, Urine STRAW (*) YELLOW   APPearance CLEAR  CLEAR   Specific Gravity, Urine <1.005 (*) 1.005 - 1.030   pH 6.0  5.0 - 8.0   Glucose, UA NEGATIVE  NEGATIVE mg/dL   Hgb urine dipstick MODERATE (*) NEGATIVE   Bilirubin Urine NEGATIVE  NEGATIVE   Ketones, ur NEGATIVE  NEGATIVE mg/dL   Protein, ur NEGATIVE  NEGATIVE mg/dL   Urobilinogen, UA 0.2  0.0 - 1.0 mg/dL   Nitrite NEGATIVE  NEGATIVE   Leukocytes, UA SMALL (*) NEGATIVE  CBC WITH DIFFERENTIAL      Component Value Range   WBC 8.2  4.0 - 10.5 K/uL   RBC 4.26  3.87 - 5.11 MIL/uL   Hemoglobin 13.2  12.0 - 15.0 g/dL   HCT 16.1  09.6 - 04.5 %   MCV 91.1  78.0 - 100.0 fL   MCH 31.0  26.0 - 34.0 pg   MCHC 34.0  30.0 - 36.0 g/dL   RDW 40.9  81.1 - 91.4 %   Platelets 302  150 - 400 K/uL   Neutrophils Relative 50  43 - 77 %   Neutro Abs 4.0  1.7 - 7.7 K/uL   Lymphocytes Relative 39  12 - 46 %   Lymphs Abs 3.2  0.7 - 4.0 K/uL   Monocytes Relative 9  3 - 12 %   Monocytes Absolute 0.8  0.1 - 1.0 K/uL   Eosinophils Relative 2  0 - 5 %   Eosinophils Absolute 0.2  0.0 - 0.7 K/uL   Basophils Relative 0  0 - 1 %   Basophils Absolute 0.0  0.0 - 0.1 K/uL  BASIC METABOLIC PANEL      Component Value Range   Sodium 138  135 - 145 mEq/L   Potassium 3.2 (*) 3.5 - 5.1 mEq/L   Chloride 99  96  - 112 mEq/L   CO2 29  19 - 32 mEq/L   Glucose, Bld 101 (*)  70 - 99 mg/dL   BUN 10  6 - 23 mg/dL   Creatinine, Ser 1.19  0.50 - 1.10 mg/dL   Calcium 14.7  8.4 - 82.9 mg/dL   GFR calc non Af Amer 67 (*) >90 mL/min   GFR calc Af Amer 78 (*) >90 mL/min  D-DIMER, QUANTITATIVE      Component Value Range   D-Dimer, Quant 0.77 (*) 0.00 - 0.48 ug/mL-FEU  TROPONIN I      Component Value Range   Troponin I <0.30  <0.30 ng/mL  URINE MICROSCOPIC-ADD ON      Component Value Range   Squamous Epithelial / LPF FEW (*) RARE   WBC, UA 3-6  <3 WBC/hpf   RBC / HPF 3-6  <3 RBC/hpf   Bacteria, UA RARE  RARE    Ct Angio Chest W/cm &/or Wo Cm  07/07/2011  *RADIOLOGY REPORT*  Clinical Data: Chest pain  CT ANGIOGRAPHY CHEST  Technique:  Multidetector CT imaging of the chest using the standard protocol during bolus administration of intravenous contrast. Multiplanar reconstructed images including MIPs were obtained and reviewed to evaluate the vascular anatomy.  Contrast: OMNIPAQUE IOHEXOL 350 MG/ML SOLN  Comparison: 05/28/2008 radiograph  Findings: No pulmonary arterial branch filling defect identified. Normal caliber aorta.  Mild cardiomegaly.  No pleural or pericardial effusion.  No intrathoracic lymphadenopathy.  Limited images through the upper abdomen show no acute abnormality. Hepatic steatosis.  Central airways are patent.  Detailed parenchymal evaluation is degraded by respiratory motion.  There are bibasilar opacities, predominately linear.  Subpleural nodular opacity on the left measures 7 mm. No pneumothorax.  No acute osseous finding.  IMPRESSION: No pulmonary embolism identified.  Linear lung base opacities are likely atelectasis and/or scarring.  7 mm left lower lobe subpleural nodule. If the patient is at high risk for bronchogenic carcinoma, follow-up chest CT at 3-6 months is recommended.  If the patient is at low risk for bronchogenic carcinoma, follow-up chest CT at 6-12 months is recommended.   This recommendation follows the consensus statement: Guidelines for Management of Small Pulmonary Nodules Detected on CT Scans: A Statement from the Fleischner Society as published in Radiology 2005; 237:395-400.  Mild cardiomegaly.  Hepatic steatosis.  Original Report Authenticated By: Waneta Martins, M.D.    Date: 07/07/2011  Rate: 66  Rhythm: normal sinus rhythm  QRS Axis: normal  Intervals: normal  ST/T Wave abnormalities: normal  Conduction Disutrbances:none  Narrative Interpretation:   Old EKG Reviewed: none available    1. Chest wall pain   2. Back pain   3. Pulmonary nodule       MDM  Workup tonight without evidence of pulmonary embolism pneumonia or pneumothorax pain most likely bilateral chest wall pain. Low back pains been present without focal neuro deficits. Urinalysis with with questionable urinalysis but do not believe it's consistent with a urinary tract infection sent urine culture off patient can be recontacted. EKG without acute changes troponin cardiac marker after chest pain all day greater than 6 hours old is negative d-dimer was elevated this would lead to doing a CT angiogram which showed no signs of pulmonary embolism. Patient's primary care provider in followup with.  Pulmonary nodule noted in the followup with her primary care provider.   I personally performed the services described in this documentation, which was scribed in my presence. The recorded information has been reviewed and considered.          Shelda Jakes, MD 07/07/11 [phone]  Shelda Jakes, MD 07/08/11 [phone]

## 2011-07-07 NOTE — ED Notes (Signed)
Alert, in no distress; reports pain decreased; instructions and prescriptions reviewed; f/u information provided.  Left in c/o family for transport home.

## 2011-07-07 NOTE — ED Notes (Signed)
Pt c/o low back pain increases with deep breath

## 2011-07-07 NOTE — Discharge Instructions (Signed)
Workup today showed a pulmonary nodule no other abnormalities in the lungs no blood clots in the lungs no pneumonia no collapsed lung. The pulmonary nodule require followup by your primary care Dr. Lurline Idol primary care Dr. In next few days. Work note provided to be off for 2 days. Take pain medicine anti-inflammatory medicine and muscle relaxer as needed both for the bilateral chest wall pain and the low back pain. Return for new or worse symptoms.

## 2011-07-07 NOTE — ED Notes (Signed)
Pain low back for 3 days,  Pain bil lat chest  All day. N o injury, No cough, Headache,

## 2011-07-09 LAB — URINE CULTURE: Colony Count: 60000

## 2011-07-13 NOTE — ED Notes (Signed)
+  Urine. Chart sent to EDP office for review. Chart returned from EDP office. "If patient is symptomatic, Keflex 500 mg BID x 7 days, #14". Prescribed by C. Williams PA-C.

## 2011-07-14 NOTE — ED Notes (Signed)
Patient called back and was informed of +Urine. Wants Rx called to [facility]. Rx called in by Jaci Lazier PFM.

## 2011-07-14 NOTE — ED Notes (Signed)
Attempted to contact patient. No answer. Left voicemail for patient to call back. °

## 2011-10-15 ENCOUNTER — Other Ambulatory Visit: Payer: Self-pay | Admitting: Adult Health

## 2011-10-15 ENCOUNTER — Other Ambulatory Visit (HOSPITAL_COMMUNITY)
Admission: RE | Admit: 2011-10-15 | Discharge: 2011-10-15 | Disposition: A | Discharge status: Home / Self Care | Payer: BC Managed Care – PPO | Source: Ambulatory Visit | Attending: Obstetrics and Gynecology | Admitting: Obstetrics and Gynecology

## 2011-10-15 DIAGNOSIS — Z01419 Encounter for gynecological examination (general) (routine) without abnormal findings: Secondary | ICD-10-CM | POA: Insufficient documentation

## 2011-10-15 DIAGNOSIS — Z1151 Encounter for screening for human papillomavirus (HPV): Secondary | ICD-10-CM | POA: Insufficient documentation

## 2011-11-08 ENCOUNTER — Other Ambulatory Visit: Payer: Self-pay | Admitting: Physical Medicine and Rehabilitation

## 2011-11-08 ENCOUNTER — Ambulatory Visit (HOSPITAL_COMMUNITY)
Admission: RE | Admit: 2011-11-08 | Discharge: 2011-11-08 | Disposition: A | Discharge status: Home / Self Care | Payer: BC Managed Care – PPO | Source: Ambulatory Visit | Attending: Physical Medicine and Rehabilitation | Admitting: Physical Medicine and Rehabilitation

## 2011-11-08 DIAGNOSIS — M47817 Spondylosis without myelopathy or radiculopathy, lumbosacral region: Secondary | ICD-10-CM

## 2011-11-08 DIAGNOSIS — M5137 Other intervertebral disc degeneration, lumbosacral region: Secondary | ICD-10-CM | POA: Insufficient documentation

## 2011-11-08 DIAGNOSIS — M545 Low back pain, unspecified: Secondary | ICD-10-CM

## 2011-11-08 DIAGNOSIS — M51379 Other intervertebral disc degeneration, lumbosacral region without mention of lumbar back pain or lower extremity pain: Secondary | ICD-10-CM | POA: Insufficient documentation

## 2011-11-26 ENCOUNTER — Other Ambulatory Visit: Payer: Self-pay | Admitting: Adult Health

## 2011-11-26 DIAGNOSIS — R222 Localized swelling, mass and lump, trunk: Secondary | ICD-10-CM

## 2011-12-01 ENCOUNTER — Ambulatory Visit (HOSPITAL_COMMUNITY)
Admission: RE | Admit: 2011-12-01 | Discharge: 2011-12-01 | Disposition: A | Discharge status: Home / Self Care | Payer: BC Managed Care – PPO | Source: Ambulatory Visit | Attending: Adult Health | Admitting: Adult Health

## 2011-12-01 ENCOUNTER — Other Ambulatory Visit: Payer: Self-pay | Admitting: Adult Health

## 2011-12-01 DIAGNOSIS — R222 Localized swelling, mass and lump, trunk: Secondary | ICD-10-CM

## 2011-12-01 DIAGNOSIS — K7689 Other specified diseases of liver: Secondary | ICD-10-CM | POA: Insufficient documentation

## 2011-12-01 MED ORDER — IOHEXOL 300 MG/ML  SOLN
80.0000 mL | Freq: Once | INTRAMUSCULAR | Status: AC | PRN
  Administered 2011-12-01: 80 mL via INTRAVENOUS

## 2011-12-28 ENCOUNTER — Emergency Department (HOSPITAL_COMMUNITY)
Admission: EM | Admit: 2011-12-28 | Discharge: 2011-12-28 | Disposition: A | Discharge status: Home / Self Care | Payer: BC Managed Care – PPO | Source: Home / Self Care

## 2012-03-29 ENCOUNTER — Other Ambulatory Visit: Payer: Self-pay | Admitting: Adult Health

## 2012-04-09 ENCOUNTER — Encounter: Payer: Self-pay | Admitting: Gastroenterology

## 2012-04-14 ENCOUNTER — Ambulatory Visit: Payer: BC Managed Care – PPO | Admitting: Gastroenterology

## 2012-04-14 ENCOUNTER — Telehealth: Payer: Self-pay | Admitting: Gastroenterology

## 2012-04-14 NOTE — Telephone Encounter (Signed)
noted 

## 2012-04-14 NOTE — Telephone Encounter (Signed)
Pt was a no show

## 2012-08-05 ENCOUNTER — Other Ambulatory Visit: Payer: Self-pay | Admitting: Obstetrics & Gynecology

## 2012-09-09 ENCOUNTER — Other Ambulatory Visit: Payer: Self-pay | Admitting: Obstetrics and Gynecology

## 2012-09-09 DIAGNOSIS — Z139 Encounter for screening, unspecified: Secondary | ICD-10-CM

## 2012-09-10 ENCOUNTER — Ambulatory Visit (HOSPITAL_COMMUNITY): Payer: BC Managed Care – PPO

## 2012-10-27 ENCOUNTER — Ambulatory Visit (INDEPENDENT_AMBULATORY_CARE_PROVIDER_SITE_OTHER): Payer: BC Managed Care – PPO | Admitting: Adult Health

## 2012-10-27 ENCOUNTER — Encounter: Payer: Self-pay | Admitting: Adult Health

## 2012-10-27 ENCOUNTER — Encounter (INDEPENDENT_AMBULATORY_CARE_PROVIDER_SITE_OTHER): Payer: Self-pay

## 2012-10-27 VITALS — BP 148/98 | HR 76 | Ht 67.0 in | Wt 234.0 lb

## 2012-10-27 DIAGNOSIS — E039 Hypothyroidism, unspecified: Secondary | ICD-10-CM

## 2012-10-27 DIAGNOSIS — E785 Hyperlipidemia, unspecified: Secondary | ICD-10-CM

## 2012-10-27 DIAGNOSIS — Z01419 Encounter for gynecological examination (general) (routine) without abnormal findings: Secondary | ICD-10-CM

## 2012-10-27 DIAGNOSIS — Z1212 Encounter for screening for malignant neoplasm of rectum: Secondary | ICD-10-CM

## 2012-10-27 DIAGNOSIS — L293 Anogenital pruritus, unspecified: Secondary | ICD-10-CM

## 2012-10-27 DIAGNOSIS — I10 Essential (primary) hypertension: Secondary | ICD-10-CM

## 2012-10-27 DIAGNOSIS — E119 Type 2 diabetes mellitus without complications: Secondary | ICD-10-CM

## 2012-10-27 LAB — CBC
HCT: 38.1 % (ref 36.0–46.0)
Hemoglobin: 13 g/dL (ref 12.0–15.0)
MCH: 30.2 pg (ref 26.0–34.0)
MCHC: 34.1 g/dL (ref 30.0–36.0)
MCV: 88.6 fL (ref 78.0–100.0)
Platelets: 327 10*3/uL (ref 150–400)
RBC: 4.3 MIL/uL (ref 3.87–5.11)
RDW: 14.1 % (ref 11.5–15.5)
WBC: 7 10*3/uL (ref 4.0–10.5)

## 2012-10-27 LAB — HEMOCCULT GUIAC POC 1CARD (OFFICE): Fecal Occult Blood, POC: NEGATIVE

## 2012-10-27 LAB — HEMOGLOBIN A1C
Hgb A1c MFr Bld: 6.3 % — ABNORMAL HIGH (ref <5.7)
Mean Plasma Glucose: 134 mg/dL — ABNORMAL HIGH (ref <117)

## 2012-10-27 LAB — COMPREHENSIVE METABOLIC PANEL
ALT: 16 U/L (ref 0–35)
AST: 13 U/L (ref 0–37)
Albumin: 4.1 g/dL (ref 3.5–5.2)
Alkaline Phosphatase: 62 U/L (ref 39–117)
BUN: 17 mg/dL (ref 6–23)
CO2: 31 mEq/L (ref 19–32)
Calcium: 9.4 mg/dL (ref 8.4–10.5)
Chloride: 103 mEq/L (ref 96–112)
Creat: 0.87 mg/dL (ref 0.50–1.10)
Glucose, Bld: 86 mg/dL (ref 70–99)
Potassium: 4 mEq/L (ref 3.5–5.3)
Sodium: 141 mEq/L (ref 135–145)
Total Bilirubin: 0.7 mg/dL (ref 0.3–1.2)
Total Protein: 6.5 g/dL (ref 6.0–8.3)

## 2012-10-27 LAB — LIPID PANEL
Cholesterol: 265 mg/dL — ABNORMAL HIGH (ref 0–200)
HDL: 66 mg/dL (ref >39)
LDL Cholesterol: 187 mg/dL — ABNORMAL HIGH (ref 0–99)
Total CHOL/HDL Ratio: 4 Ratio
Triglycerides: 61 mg/dL (ref <150)
VLDL: 12 mg/dL (ref 0–40)

## 2012-10-27 LAB — TSH: TSH: 1.513 u[IU]/mL (ref 0.350–4.500)

## 2012-10-27 NOTE — Patient Instructions (Signed)
Physical in 1 year Mammogram yearly Colonoscopy per dr fields Follow up labs Get flu shot

## 2012-10-27 NOTE — Progress Notes (Signed)
Patient ID: Becky [name], female   DOB: October 11, 1960, 52 y.o.   MRN: [id number] History of Present Illness: Becky [name] is a 51 year old black female married in for physical.She had a normal pap with negative HPV 10/15/11.Complains of vaginal itch at times. Had colonoscopy at 49.  Current Medications, Allergies, Past Medical History, Past Surgical History, Family History and Social History were reviewed in [employer] record.     Review of Systems: patient denies any headaches, blurred vision, shortness of breath, chest pain, abdominal pain, problems with bowel movements, urination, or intercourse. She has occasional constipation, and body aches.No mood changes.    Physical Exam:BP 148/98  Pulse 76  Ht 5\' 7"  (1.702 m)  Wt 234 lb (106.142 kg)  BMI 36.64 kg/m2 General:  Well developed, well nourished, no acute distress Skin:  Warm and dry Neck:  Midline trachea, thyroid enlarged history of goiter Lungs; Clear to auscultation bilaterally Breast:  No dominant palpable mass, retraction, or nipple discharge Cardiovascular: Regular rate and rhythm Abdomen:  Soft, non tender, no hepatosplenomegaly Pelvic:  External genitalia is normal in appearance.  The vagina is normal in appearance.  The cervix is bulbous.  Uterus is felt to be normal size, shape, and contour.  No  adnexal masses or tenderness noted. Rectal: Good sphincter tone, no polyps, or hemorrhoids felt.  Hemoccult negative. Extremities:  No swelling or varicosities noted Psych:  No mood changes, alert and cooperative   Impression: Yearly gyn exam no pap Hypertension Hypothyroid Hyperlipidemia Vaginal pruritus Diabetes    Plan: Physical in 1 year Mammogram yearly Colonoscopy per Dr Darrick Penna Check CBC,CMP,TSH, lipids and A1c Get flu shot  follow labs in 48 hours

## 2012-11-15 ENCOUNTER — Ambulatory Visit: Payer: BC Managed Care – PPO | Admitting: Obstetrics and Gynecology

## 2013-03-23 ENCOUNTER — Other Ambulatory Visit: Payer: Self-pay | Admitting: Adult Health

## 2013-04-22 ENCOUNTER — Other Ambulatory Visit: Payer: Self-pay | Admitting: Adult Health

## 2013-05-23 ENCOUNTER — Ambulatory Visit (INDEPENDENT_AMBULATORY_CARE_PROVIDER_SITE_OTHER): Payer: BC Managed Care – PPO | Admitting: Adult Health

## 2013-05-23 ENCOUNTER — Encounter: Payer: Self-pay | Admitting: Adult Health

## 2013-05-23 VITALS — BP 114/76 | Ht 68.0 in | Wt 237.0 lb

## 2013-05-23 DIAGNOSIS — A499 Bacterial infection, unspecified: Secondary | ICD-10-CM

## 2013-05-23 DIAGNOSIS — B9689 Other specified bacterial agents as the cause of diseases classified elsewhere: Secondary | ICD-10-CM

## 2013-05-23 DIAGNOSIS — N760 Acute vaginitis: Secondary | ICD-10-CM | POA: Insufficient documentation

## 2013-05-23 DIAGNOSIS — R319 Hematuria, unspecified: Secondary | ICD-10-CM

## 2013-05-23 DIAGNOSIS — N899 Noninflammatory disorder of vagina, unspecified: Secondary | ICD-10-CM

## 2013-05-23 DIAGNOSIS — N898 Other specified noninflammatory disorders of vagina: Secondary | ICD-10-CM | POA: Insufficient documentation

## 2013-05-23 HISTORY — DX: Hematuria, unspecified: R31.9

## 2013-05-23 HISTORY — DX: Other specified noninflammatory disorders of vagina: N89.8

## 2013-05-23 HISTORY — DX: Other specified bacterial agents as the cause of diseases classified elsewhere: B96.89

## 2013-05-23 LAB — POCT URINALYSIS DIPSTICK

## 2013-05-23 LAB — POCT WET PREP (WET MOUNT)

## 2013-05-23 MED ORDER — METRONIDAZOLE 500 MG PO TABS: 500.0000 mg | ORAL_TABLET | Freq: Two times a day (BID) | ORAL | Status: DC

## 2013-05-23 NOTE — Progress Notes (Signed)
Subjective:     Patient ID: Becky [name], female   DOB: 06/10/1960, 53 y.o.   MRN: [id number]  HPI Becky [name] is a 53 year old black female,married in complaining of vaginal irritation and some low back pain has taken AZO and diflucan.  Review of Systems See HPI Reviewed past medical,surgical, social and family history. Reviewed medications and allergies.     Objective:   Physical Exam BP 114/76  Ht 5\' 8"  (1.727 m)  Wt 237 lb (107.502 kg)  BMI 36.04 kg/m2urine dipstick 3+ blood trace leuks, Skin warm and dry.Pelvic: external genitalia is normal in appearance, vagina: scant discharge,red side walls,cervix smooth, uterusnormal size, shape and contour, non tender, no masses felt, adnexa: no masses or tenderness noted. Wet prep: + for clue cells and +WBCs.   No CVAT, has several skin tags that need to be removed, one is bigger than end of thumb.  Assessment:     Vaginal irritation BV Hematuria     Plan:    Rx flagyl 500 mg 1 bid x 7 days, no alcohol, review handout on BV   UA C&S sent Follow up prn

## 2013-05-23 NOTE — Patient Instructions (Signed)
Bacterial Vaginosis Bacterial vaginosis is a vaginal infection that occurs when the normal balance of bacteria in the vagina is disrupted. It results from an overgrowth of certain bacteria. This is the most common vaginal infection in women of childbearing age. Treatment is important to prevent complications, especially in pregnant women, as it can cause a premature delivery. CAUSES  Bacterial vaginosis is caused by an increase in harmful bacteria that are normally present in smaller amounts in the vagina. Several different kinds of bacteria can cause bacterial vaginosis. However, the reason that the condition develops is not fully understood. RISK FACTORS Certain activities or behaviors can put you at an increased risk of developing bacterial vaginosis, including:  Having a new sex partner or multiple sex partners.  Douching.  Using an intrauterine device (IUD) for contraception. Women do not get bacterial vaginosis from toilet seats, bedding, swimming pools, or contact with objects around them. SIGNS AND SYMPTOMS  Some women with bacterial vaginosis have no signs or symptoms. Common symptoms include:  Grey vaginal discharge.  A fishlike odor with discharge, especially after sexual intercourse.  Itching or burning of the vagina and vulva.  Burning or pain with urination. DIAGNOSIS  Your health care provider will take a medical history and examine the vagina for signs of bacterial vaginosis. A sample of vaginal fluid may be taken. Your health care provider will look at this sample under a microscope to check for bacteria and abnormal cells. A vaginal pH test may also be done.  TREATMENT  Bacterial vaginosis may be treated with antibiotic medicines. These may be given in the form of a pill or a vaginal cream. A second round of antibiotics may be prescribed if the condition comes back after treatment.  HOME CARE INSTRUCTIONS   Only take over-the-counter or prescription medicines as  directed by your health care provider.  If antibiotic medicine was prescribed, take it as directed. Make sure you finish it even if you start to feel better.  Do not have sex until treatment is completed.  Tell all sexual partners that you have a vaginal infection. They should see their health care provider and be treated if they have problems, such as a mild rash or itching.  Practice safe sex by using condoms and only having one sex partner. SEEK MEDICAL CARE IF:   Your symptoms are not improving after 3 days of treatment.  You have increased discharge or pain.  You have a fever. MAKE SURE YOU:   Understand these instructions.  Will watch your condition.  Will get help right away if you are not doing well or get worse. FOR MORE INFORMATION  Centers for Disease Control and Prevention, Division of STD Prevention: AppraiserFraud.fi American Sexual Health Association (ASHA): www.ashastd.org  Document Released: 12/23/2004 Document Revised: 10/13/2012 Document Reviewed: 08/04/2012 [facility] Patient Information 2014 [place]. Take flagyl NO SEX No alcohol Follow up prn

## 2013-05-24 LAB — URINALYSIS
Bilirubin Urine: NEGATIVE
Glucose, UA: NEGATIVE mg/dL
Ketones, ur: NEGATIVE mg/dL
Nitrite: NEGATIVE
Protein, ur: NEGATIVE mg/dL
Specific Gravity, Urine: 1.027 (ref 1.005–1.030)
Urobilinogen, UA: 0.2 mg/dL (ref 0.0–1.0)
pH: 5.5 (ref 5.0–8.0)

## 2013-05-25 ENCOUNTER — Telehealth: Payer: Self-pay | Admitting: Adult Health

## 2013-05-25 LAB — URINE CULTURE: Colony Count: 40000

## 2013-05-25 MED ORDER — AMPICILLIN 500 MG PO CAPS: 500.0000 mg | ORAL_CAPSULE | Freq: Four times a day (QID) | ORAL | Status: DC

## 2013-05-25 NOTE — Telephone Encounter (Signed)
Left message Urine +GBS will rx ampicillin at [facility] pharmacy

## 2013-06-16 ENCOUNTER — Ambulatory Visit (INDEPENDENT_AMBULATORY_CARE_PROVIDER_SITE_OTHER): Payer: BC Managed Care – PPO | Admitting: Obstetrics & Gynecology

## 2013-06-16 ENCOUNTER — Other Ambulatory Visit: Payer: Self-pay | Admitting: Obstetrics & Gynecology

## 2013-06-16 ENCOUNTER — Encounter: Payer: Self-pay | Admitting: Obstetrics & Gynecology

## 2013-06-16 VITALS — BP 140/80 | Ht 67.0 in | Wt 233.0 lb

## 2013-06-16 DIAGNOSIS — L989 Disorder of the skin and subcutaneous tissue, unspecified: Secondary | ICD-10-CM

## 2013-06-16 NOTE — Progress Notes (Signed)
Patient ID: Becky [name], female   DOB: 1960-07-12, 53 y.o.   MRN: [id number] Blood pressure 140/80, height 5\' 7"  (1.702 m), weight 233 lb (105.688 kg). Pt has polypoid lesion on the left inner thigh 2x2 cm 2 skin tags removed without difficulty  Procedure Note Area of left thigh was prepped and draped 1% lidocaine is injected Wide excision is performed Simple closue using 5 3-0 ethilon sutures placed Goo hemostasis Dressed with neospoin, guaze and hypofix  Follow up 6/22 for suture removal

## 2013-06-30 ENCOUNTER — Ambulatory Visit (INDEPENDENT_AMBULATORY_CARE_PROVIDER_SITE_OTHER): Payer: BC Managed Care – PPO | Admitting: Obstetrics & Gynecology

## 2013-06-30 ENCOUNTER — Encounter: Payer: Self-pay | Admitting: Obstetrics & Gynecology

## 2013-06-30 VITALS — BP 120/80 | Wt 235.0 lb

## 2013-06-30 DIAGNOSIS — Z4802 Encounter for removal of sutures: Secondary | ICD-10-CM

## 2013-06-30 NOTE — Progress Notes (Signed)
Patient ID: Becky [name], female   DOB: 07/20/1960, 53 y.o.   MRN: [id number] Blood pressure 120/80, weight 235 lb (106.595 kg).  Incision clean dry intact 4 sutures removed  pathjolgy benign No complaints  Follow up prn

## 2013-08-22 ENCOUNTER — Ambulatory Visit (HOSPITAL_COMMUNITY): Payer: BC Managed Care – PPO

## 2013-08-25 ENCOUNTER — Encounter: Payer: Self-pay | Admitting: Obstetrics & Gynecology

## 2013-10-20 ENCOUNTER — Ambulatory Visit (HOSPITAL_COMMUNITY)
Admission: RE | Admit: 2013-10-20 | Discharge: 2013-10-20 | Disposition: A | Discharge status: Home / Self Care | Payer: BC Managed Care – PPO | Source: Ambulatory Visit | Attending: Orthopedic Surgery | Admitting: Orthopedic Surgery

## 2013-10-20 DIAGNOSIS — M25571 Pain in right ankle and joints of right foot: Secondary | ICD-10-CM | POA: Insufficient documentation

## 2013-10-20 DIAGNOSIS — M25671 Stiffness of right ankle, not elsewhere classified: Secondary | ICD-10-CM | POA: Insufficient documentation

## 2013-10-20 DIAGNOSIS — Z5189 Encounter for other specified aftercare: Secondary | ICD-10-CM | POA: Insufficient documentation

## 2013-10-20 DIAGNOSIS — M545 Low back pain, unspecified: Secondary | ICD-10-CM | POA: Insufficient documentation

## 2013-10-20 DIAGNOSIS — M25579 Pain in unspecified ankle and joints of unspecified foot: Secondary | ICD-10-CM | POA: Insufficient documentation

## 2013-10-20 DIAGNOSIS — M25673 Stiffness of unspecified ankle, not elsewhere classified: Secondary | ICD-10-CM | POA: Insufficient documentation
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
## 2013-10-25 ENCOUNTER — Other Ambulatory Visit: Payer: Self-pay | Admitting: Obstetrics & Gynecology

## 2013-10-26 ENCOUNTER — Inpatient Hospital Stay (HOSPITAL_COMMUNITY): Admission: RE | Admit: 2013-10-26 | Payer: BC Managed Care – PPO | Source: Ambulatory Visit

## 2013-10-27 ENCOUNTER — Ambulatory Visit (HOSPITAL_COMMUNITY)
Admission: RE | Admit: 2013-10-27 | Discharge: 2013-10-27 | Disposition: A | Discharge status: Home / Self Care | Payer: BC Managed Care – PPO | Source: Ambulatory Visit | Attending: Internal Medicine | Admitting: Internal Medicine

## 2013-10-27 DIAGNOSIS — Z5189 Encounter for other specified aftercare: Secondary | ICD-10-CM | POA: Diagnosis not present
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
## 2013-11-02 ENCOUNTER — Other Ambulatory Visit: Payer: Self-pay | Admitting: Obstetrics and Gynecology

## 2013-11-02 ENCOUNTER — Ambulatory Visit (HOSPITAL_COMMUNITY)
Admission: RE | Admit: 2013-11-02 | Discharge: 2013-11-02 | Disposition: A | Discharge status: Home / Self Care | Payer: BC Managed Care – PPO | Source: Ambulatory Visit | Attending: Internal Medicine | Admitting: Internal Medicine

## 2013-11-02 ENCOUNTER — Encounter: Payer: Self-pay | Admitting: Adult Health

## 2013-11-02 ENCOUNTER — Ambulatory Visit (INDEPENDENT_AMBULATORY_CARE_PROVIDER_SITE_OTHER): Payer: BC Managed Care – PPO | Admitting: Adult Health

## 2013-11-02 VITALS — BP 180/80 | HR 78 | Ht 67.25 in | Wt 238.0 lb

## 2013-11-02 DIAGNOSIS — E7800 Pure hypercholesterolemia, unspecified: Secondary | ICD-10-CM

## 2013-11-02 DIAGNOSIS — R300 Dysuria: Secondary | ICD-10-CM

## 2013-11-02 DIAGNOSIS — N898 Other specified noninflammatory disorders of vagina: Secondary | ICD-10-CM

## 2013-11-02 DIAGNOSIS — Z01419 Encounter for gynecological examination (general) (routine) without abnormal findings: Secondary | ICD-10-CM

## 2013-11-02 DIAGNOSIS — A499 Bacterial infection, unspecified: Secondary | ICD-10-CM

## 2013-11-02 DIAGNOSIS — M25671 Stiffness of right ankle, not elsewhere classified: Secondary | ICD-10-CM

## 2013-11-02 DIAGNOSIS — Z1231 Encounter for screening mammogram for malignant neoplasm of breast: Secondary | ICD-10-CM

## 2013-11-02 DIAGNOSIS — R102 Pelvic and perineal pain unspecified side: Secondary | ICD-10-CM

## 2013-11-02 DIAGNOSIS — Z5189 Encounter for other specified aftercare: Secondary | ICD-10-CM | POA: Diagnosis not present

## 2013-11-02 DIAGNOSIS — Z1212 Encounter for screening for malignant neoplasm of rectum: Secondary | ICD-10-CM

## 2013-11-02 DIAGNOSIS — N760 Acute vaginitis: Secondary | ICD-10-CM

## 2013-11-02 DIAGNOSIS — R319 Hematuria, unspecified: Secondary | ICD-10-CM

## 2013-11-02 DIAGNOSIS — B9689 Other specified bacterial agents as the cause of diseases classified elsewhere: Secondary | ICD-10-CM

## 2013-11-02 DIAGNOSIS — M25571 Pain in right ankle and joints of right foot: Secondary | ICD-10-CM

## 2013-11-02 HISTORY — DX: Pure hypercholesterolemia, unspecified: E78.00

## 2013-11-02 HISTORY — DX: Pelvic and perineal pain: R10.2

## 2013-11-02 LAB — POCT WET PREP (WET MOUNT): WBC, Wet Prep HPF POC: POSITIVE

## 2013-11-02 LAB — HEMOCCULT GUIAC POC 1CARD (OFFICE): Fecal Occult Blood, POC: NEGATIVE

## 2013-11-02 LAB — CBC
HCT: 38.1 % (ref 36.0–46.0)
Hemoglobin: 13 g/dL (ref 12.0–15.0)
MCH: 30.7 pg (ref 26.0–34.0)
MCHC: 34.1 g/dL (ref 30.0–36.0)
MCV: 89.9 fL (ref 78.0–100.0)
Platelets: 341 10*3/uL (ref 150–400)
RBC: 4.24 MIL/uL (ref 3.87–5.11)
RDW: 14 % (ref 11.5–15.5)
WBC: 7 10*3/uL (ref 4.0–10.5)

## 2013-11-02 LAB — POCT URINALYSIS DIPSTICK

## 2013-11-02 LAB — LIPID PANEL
Cholesterol: 276 mg/dL — ABNORMAL HIGH (ref 0–200)
HDL: 68 mg/dL (ref >39)
LDL Cholesterol: 191 mg/dL — ABNORMAL HIGH (ref 0–99)
Total CHOL/HDL Ratio: 4.1 Ratio
Triglycerides: 87 mg/dL (ref <150)
VLDL: 17 mg/dL (ref 0–40)

## 2013-11-02 LAB — COMPREHENSIVE METABOLIC PANEL
ALT: 23 U/L (ref 0–35)
AST: 15 U/L (ref 0–37)
Albumin: 4.5 g/dL (ref 3.5–5.2)
Alkaline Phosphatase: 69 U/L (ref 39–117)
BUN: 10 mg/dL (ref 6–23)
CO2: 30 mEq/L (ref 19–32)
Calcium: 10 mg/dL (ref 8.4–10.5)
Chloride: 101 mEq/L (ref 96–112)
Creat: 0.87 mg/dL (ref 0.50–1.10)
Glucose, Bld: 95 mg/dL (ref 70–99)
Potassium: 4 mEq/L (ref 3.5–5.3)
Sodium: 141 mEq/L (ref 135–145)
Total Bilirubin: 0.5 mg/dL (ref 0.2–1.2)
Total Protein: 7.1 g/dL (ref 6.0–8.3)

## 2013-11-02 LAB — TSH: TSH: 2.676 u[IU]/mL (ref 0.350–4.500)

## 2013-11-02 MED ORDER — PROMETHAZINE HCL 25 MG PO TABS: 25.0000 mg | ORAL_TABLET | Freq: Four times a day (QID) | ORAL | Status: DC | PRN

## 2013-11-02 MED ORDER — METRONIDAZOLE 500 MG PO TABS
500.0000 mg | ORAL_TABLET | Freq: Two times a day (BID) | ORAL | Status: DC
Start: 2013-11-02 — End: 2013-11-16

## 2013-11-02 MED ORDER — SULFAMETHOXAZOLE-TMP DS 800-160 MG PO TABS: 1.0000 | ORAL_TABLET | Freq: Two times a day (BID) | ORAL | Status: DC

## 2013-11-02 NOTE — Progress Notes (Signed)
Physical Therapy Treatment Patient Details  Name: Temperance Kelemen MRN: [id number] Date of Birth: 05-17-1960  Today's Date: 11/02/2013 Time: 9798-9211 PT Time Calculation (min): 11 min Charge: TE 1530-1605, Manual [id number]  Visit#: 3 of 8  Re-eval: 11/19/13 Assessment Diagnosis: plantar  Next MD Visit: none  Authorization: [employer]  Authorization Time Period:    Authorization Visit#:   of     Subjective: Symptoms/Limitations Symptoms: Pt stated Rt heel and gastroc region, pain scale 8/10.  Reports compliance with HEP daily. Pain Assessment Currently in Pain?: Yes Pain Score: 8  Pain Location: Foot Pain Orientation: Right  Objective:   Exercise/Treatments Ankle Stretches Plantar Fascia Stretch: 3 reps;30 seconds;Limitations Plantar Fascia Stretch Limitations:  plantar fascia and dorsum of foot stretch Soleus Stretch: Limitations;3 reps;20 seconds Soleus Stretch Limitations: with wedge under heel Gastroc Stretch: 3 reps;30 seconds;Limitations Gastroc Stretch Limitations: 3 direction with wedge under medial heel Other Stretch: Hamsting st standing with 14in box 3 direction Other Stretch: Hip and groin stretch with 14in step 3 directions Bil LE; Piriformis2x 30" seated 2 directions   Manual Therapy Manual Therapy: Massage Massage: MFR to plantar surface, STM to gastroc/soleus complex to reduce spasms.    Physical Therapy Assessment and Plan PT Assessment and Plan Clinical Impression Statement: Session focus on improving LE and ankle mobility with stretches, added medial wedge under heel to improve inversaion and reduce strain on ankle.  Added piriformis stretches to improve hip IR with gait to absorb shock of impact with gait.  Manual technqiues complete to reduce fascial restrictions on plantar surface and spasms on gastroc/soleus complex. PT Plan: Continue with current PT POC with medial heel wedge to gastoc and soleus stretches, continue hip/groin, hamstring and  piriformis to improve gait mechanics and provide better positioning for feet during statis standing.  Manual technqiues to reduce spasms gastroc/soleus complex/      Goals PT Short Term Goals PT Short Term Goal 1: Pt pain to be no greater than a 5 when getting up from being nonweight bearing for longer than 30 minutes.  PT Short Term Goal 1 - Progress: Progressing toward goal PT Short Term Goal 2: Pt to be able to stand for 2 hours without increased pain  PT Short Term Goal 2 - Progress: Progressing toward goal PT Short Term Goal 3: Pt to be able to walk for 2 hours without increased pain  PT Short Term Goal 3 - Progress: Progressing toward goal PT Long Term Goals PT Long Term Goal 1: Pt pain to be no greater than a 2/10 when getting up  PT Long Term Goal 2: Pt to be able to stand for four hours without pain  Long Term Goal 3: Pt to be able to walkd for four hours without increased pain   Problem List Patient Active Problem List   Diagnosis Date Noted  . Pelvic pain in female 11/02/2013  . Elevated cholesterol 11/02/2013  . Low back pain 10/20/2013  . Stiffness of ankle joint 10/20/2013  . Pain in joint, ankle and foot 10/20/2013  . Vaginal irritation 05/23/2013  . Hematuria 05/23/2013  . BV (bacterial vaginosis) 05/23/2013  . HEMATOCHEZIA 12/05/2009  . DYSPHAGIA 12/05/2009  . CHEST WALL PAIN, ANTERIOR 06/23/2007  . LEG PAIN 05/18/2007  . VAGINAL PRURITUS 04/16/2007  . GOITER 03/10/2007  . HYPOTHYROIDISM 03/10/2007  . DIABETES MELLITUS, TYPE II 03/10/2007  . HYPERLIPIDEMIA 03/10/2007  . ANEMIA-IRON DEFICIENCY 03/10/2007  . HYPERTENSION 03/10/2007  . RHINITIS, CHRONIC 03/10/2007  . ASTHMA, CHILDHOOD 03/10/2007  .  GERD 03/10/2007  . PEPTIC ULCER DISEASE 03/10/2007  . MENOPAUSAL SYNDROME 03/10/2007    PT - End of Session Activity Tolerance: Patient tolerated treatment well General Behavior During Therapy: [facility] for tasks assessed/performed  GP    Aldona Lento 11/02/2013, 4:41 PM

## 2013-11-02 NOTE — Progress Notes (Signed)
Patient ID: Becky [name], female   DOB: [birth year], 53 y.o.   MRN: [id number] History of Present Illness: Becky [name] is a 53 year old black female, married, in for a gyn physical.She had a normal pap with negative HPV 10/15/11.She is complaining of vaginal irritation/itch and burning with urination ,and low back pain has history of this and sees Dr Ace Gins. Also has pain in heel,has seem doctor in [place] and gets PT.She wants fasting labs. Has not been taking crestor cost too much.  Current Medications, Allergies, Past Medical History, Past Surgical History, Family History and Social History were reviewed in [employer] record.     Review of Systems: Patient denies any headaches, blurred vision, shortness of breath, chest pain, abdominal pain, problems with joints or moods,has some constipation at times and has pain in low pelvis and has pain with orgasm. See HPI. Gets dizzy if rides in back seat, can try Antivert OTC.   Physical Exam:BP 180/80  Pulse 78  Ht 5' 7.25" (1.708 m)  Wt 238 lb (107.956 kg)  BMI 37.01 kg/m2urine 2+ blood and 2+ leuks. General:  Well developed, well nourished, no acute distress Skin:  Warm and dry Neck:  Midline trachea, normal thyroid Lungs; Clear to auscultation bilaterally Breast:  No dominant palpable mass, retraction, or nipple discharge,some tenderness UOQ right breast Cardiovascular: Regular rate and rhythm Abdomen:  Soft, non tender, no hepatosplenomegaly Pelvic:  External genitalia is normal in appearance, no lesions.  The vagina has white discharge with slight odor. The cervix is bulbous.  Uterus is felt to be normal size, shape, and contour.  No                adnexal masses or tenderness noted.wet prep: + WBC and + clue cells. Rectal: Good sphincter tone, no polyps, or hemorrhoids felt.  Hemoccult negative. Extremities:  No swelling or varicosities noted Psych:  No mood changes, alert and cooperative,seems  happy.   Impression: Well woman gyn exam no pap Vaginal irritation BV Hematuria burning with urination Pelvic pain Elevated cholesterol     Plan: Check CBC,CMP,TSH and lipids UA C&S sent Rx septra ds 1 bid x 7 days Rx flagyl 500 mg 1 bid x 7 days, no alcohol, review handout on BV,UTI and Pelvic pain   Rx phenergan 25 mg #30 1 every 6 hours prn N/V with 1 refill Return in 2 weeks for pelvic US and see me Pap and physical in 1 year Mammogram in November Colonoscopy per Dr Oneida Alar Get flu shot at [facility]

## 2013-11-02 NOTE — Patient Instructions (Signed)
Pelvic Pain Female pelvic pain can be caused by many different things and start from a variety of places. Pelvic pain refers to pain that is located in the lower half of the abdomen and between your hips. The pain may occur over a short period of time (acute) or may be reoccurring (chronic). The cause of pelvic pain may be related to disorders affecting the female reproductive organs (gynecologic), but it may also be related to the bladder, kidney stones, an intestinal complication, or muscle or skeletal problems. Getting help right away for pelvic pain is important, especially if there has been severe, sharp, or a sudden onset of unusual pain. It is also important to get help right away because some types of pelvic pain can be life threatening.  CAUSES  Below are only some of the causes of pelvic pain. The causes of pelvic pain can be in one of several categories.  Gynecologic. Pelvic inflammatory disease. Sexually transmitted infection. Ovarian cyst or a twisted ovarian ligament (ovarian torsion). Uterine lining that grows outside the uterus (endometriosis). Fibroids, cysts, or tumors. Ovulation. Pregnancy. Pregnancy that occurs outside the uterus (ectopic pregnancy). Miscarriage. Labor. Abruption of the placenta or ruptured uterus. Infection. Uterine infection (endometritis). Bladder infection. Diverticulitis. Miscarriage related to a uterine infection (septic abortion). Bladder. Inflammation of the bladder (cystitis). Kidney stone(s). Gastrointestinal. Constipation. Diverticulitis. Neurologic. Trauma. Feeling pelvic pain because of mental or emotional causes (psychosomatic). Cancers of the bowel or pelvis. EVALUATION  Your caregiver will want to take a careful history of your concerns. This includes recent changes in your health, a careful gynecologic history of your periods (menses), and a sexual history. Obtaining your family history and medical history is also important. Your  caregiver may suggest a pelvic exam. A pelvic exam will help identify the location and severity of the pain. It also helps in the evaluation of which organ system may be involved. In order to identify the cause of the pelvic pain and be properly treated, your caregiver may order tests. These tests may include:  A pregnancy test. Pelvic ultrasonography. An X-ray exam of the abdomen. A urinalysis or evaluation of vaginal discharge. Blood tests. HOME CARE INSTRUCTIONS  Only take over-the-counter or prescription medicines for pain, discomfort, or fever as directed by your caregiver.  Rest as directed by your caregiver.  Eat a balanced diet.  Drink enough fluids to make your urine clear or pale yellow, or as directed.  Avoid sexual intercourse if it causes pain.  Apply warm or cold compresses to the lower abdomen depending on which one helps the pain.  Avoid stressful situations.  Keep a journal of your pelvic pain. Write down when it started, where the pain is located, and if there are things that seem to be associated with the pain, such as food or your menstrual cycle. Follow up with your caregiver as directed.  SEEK MEDICAL CARE IF: Your medicine does not help your pain. You have abnormal vaginal discharge. SEEK IMMEDIATE MEDICAL CARE IF:  You have heavy bleeding from the vagina.  Your pelvic pain increases.  You feel light-headed or faint.  You have chills.  You have pain with urination or blood in your urine.  You have uncontrolled diarrhea or vomiting.  You have a fever or persistent symptoms for more than 3 days. You have a fever and your symptoms suddenly get worse.  You are being physically or sexually abused.  MAKE SURE YOU: Understand these instructions. Will watch your condition. Will get help if you  are not doing well or get worse. Document Released: 11/20/2003 Document Revised: 05/09/2013 Document Reviewed: 04/14/2011 [facility] Patient Information 2015  [place], [place]. This information is not intended to replace advice given to you by your health care provider. Make sure you discuss any questions you have with your health care provider. Urinary Tract Infection Urinary tract infections (UTIs) can develop anywhere along your urinary tract. Your urinary tract is your body's drainage system for removing wastes and extra water. Your urinary tract includes two kidneys, two ureters, a bladder, and a urethra. Your kidneys are a pair of bean-shaped organs. Each kidney is about the size of your fist. They are located below your ribs, one on each side of your spine. CAUSES Infections are caused by microbes, which are microscopic organisms, including fungi, viruses, and bacteria. These organisms are so small that they can only be seen through a microscope. Bacteria are the microbes that most commonly cause UTIs. SYMPTOMS  Symptoms of UTIs may vary by age and gender of the patient and by the location of the infection. Symptoms in young women typically include a frequent and intense urge to urinate and a painful, burning feeling in the bladder or urethra during urination. Older women and men are more likely to be tired, shaky, and weak and have muscle aches and abdominal pain. A fever may mean the infection is in your kidneys. Other symptoms of a kidney infection include pain in your back or sides below the ribs, nausea, and vomiting. DIAGNOSIS To diagnose a UTI, your caregiver will ask you about your symptoms. Your caregiver also will ask to provide a urine sample. The urine sample will be tested for bacteria and white blood cells. White blood cells are made by your body to help fight infection. TREATMENT  Typically, UTIs can be treated with medication. Because most UTIs are caused by a bacterial infection, they usually can be treated with the use of antibiotics. The choice of antibiotic and length of treatment depend on your symptoms and the type of bacteria causing  your infection. HOME CARE INSTRUCTIONS If you were prescribed antibiotics, take them exactly as your caregiver instructs you. Finish the medication even if you feel better after you have only taken some of the medication. Drink enough water and fluids to keep your urine clear or pale yellow. Avoid caffeine, tea, and carbonated beverages. They tend to irritate your bladder. Empty your bladder often. Avoid holding urine for long periods of time. Empty your bladder before and after sexual intercourse. After a bowel movement, women should cleanse from front to back. Use each tissue only once. SEEK MEDICAL CARE IF:  You have back pain. You develop a fever. Your symptoms do not begin to resolve within 3 days. SEEK IMMEDIATE MEDICAL CARE IF:  You have severe back pain or lower abdominal pain. You develop chills. You have nausea or vomiting. You have continued burning or discomfort with urination. MAKE SURE YOU:  Understand these instructions. Will watch your condition. Will get help right away if you are not doing well or get worse. Document Released: 10/02/2004 Document Revised: 06/24/2011 Document Reviewed: 01/31/2011 [facility] Patient Information 2015 [place], [place]. This information is not intended to replace advice given to you by your health care provider. Make sure you discuss any questions you have with your health care provider. Bacterial Vaginosis Bacterial vaginosis is a vaginal infection that occurs when the normal balance of bacteria in the vagina is disrupted. It results from an overgrowth of certain bacteria. This is  the most common vaginal infection in women of childbearing age. Treatment is important to prevent complications, especially in pregnant women, as it can cause a premature delivery. CAUSES  Bacterial vaginosis is caused by an increase in harmful bacteria that are normally present in smaller amounts in the vagina. Several different kinds of bacteria can cause bacterial  vaginosis. However, the reason that the condition develops is not fully understood. RISK FACTORS Certain activities or behaviors can put you at an increased risk of developing bacterial vaginosis, including:  Having a new sex partner or multiple sex partners.  Douching.  Using an intrauterine device (IUD) for contraception. Women do not get bacterial vaginosis from toilet seats, bedding, swimming pools, or contact with objects around them. SIGNS AND SYMPTOMS  Some women with bacterial vaginosis have no signs or symptoms. Common symptoms include:  Grey vaginal discharge.  A fishlike odor with discharge, especially after sexual intercourse.  Itching or burning of the vagina and vulva.  Burning or pain with urination. DIAGNOSIS  Your health care provider will take a medical history and examine the vagina for signs of bacterial vaginosis. A sample of vaginal fluid may be taken. Your health care provider will look at this sample under a microscope to check for bacteria and abnormal cells. A vaginal pH test may also be done.  TREATMENT  Bacterial vaginosis may be treated with antibiotic medicines. These may be given in the form of a pill or a vaginal cream. A second round of antibiotics may be prescribed if the condition comes back after treatment.  HOME CARE INSTRUCTIONS   Only take over-the-counter or prescription medicines as directed by your health care provider.  If antibiotic medicine was prescribed, take it as directed. Make sure you finish it even if you start to feel better.  Do not have sex until treatment is completed.  Tell all sexual partners that you have a vaginal infection. They should see their health care provider and be treated if they have problems, such as a mild rash or itching.  Practice safe sex by using condoms and only having one sex partner. SEEK MEDICAL CARE IF:   Your symptoms are not improving after 3 days of treatment.  You have increased discharge or  pain.  You have a fever. MAKE SURE YOU:   Understand these instructions.  Will watch your condition.  Will get help right away if you are not doing well or get worse. FOR MORE INFORMATION  Centers for Disease Control and Prevention, Division of STD Prevention: [employer] American Sexual Health Association (ASHA): www.ashastd.org  Document Released: 12/23/2004 Document Revised: 10/13/2012 Document Reviewed: 08/04/2012 [facility] Patient Information 2015 [place], [place]. This information is not intended to replace advice given to you by your health care provider. Make sure you discuss any questions you have with your health care provider. Return in 2 weeks for [place] and Pap and physical in 1 year Mammogram in November colonoscopy per DR Oneida Alar

## 2013-11-03 ENCOUNTER — Ambulatory Visit (HOSPITAL_COMMUNITY): Payer: BC Managed Care – PPO | Admitting: Physical Therapy

## 2013-11-03 LAB — URINALYSIS
Bilirubin Urine: NEGATIVE
Glucose, UA: NEGATIVE mg/dL
Ketones, ur: NEGATIVE mg/dL
Nitrite: NEGATIVE
Protein, ur: NEGATIVE mg/dL
Specific Gravity, Urine: 1.005 (ref 1.005–1.030)
Urobilinogen, UA: 0.2 mg/dL (ref 0.0–1.0)
pH: 7 (ref 5.0–8.0)

## 2013-11-04 ENCOUNTER — Telehealth: Payer: Self-pay | Admitting: Adult Health

## 2013-11-04 LAB — URINE CULTURE: Colony Count: 50000

## 2013-11-04 NOTE — Telephone Encounter (Signed)
Left message to call about labs 

## 2013-11-07 ENCOUNTER — Encounter: Payer: Self-pay | Admitting: Adult Health

## 2013-11-07 ENCOUNTER — Telehealth: Payer: Self-pay | Admitting: Adult Health

## 2013-11-07 MED ORDER — TRIAMCINOLONE ACETONIDE 0.5 % EX CREA: 1.0000 "application " | TOPICAL_CREAM | Freq: Two times a day (BID) | CUTANEOUS | Status: DC

## 2013-11-07 MED ORDER — SIMVASTATIN 20 MG PO TABS: 20.0000 mg | ORAL_TABLET | Freq: Every day | ORAL | Status: DC

## 2013-11-07 NOTE — Telephone Encounter (Signed)
Pt aware of labs and needs to take statin, will rx zocor 20 mg 1 at hs and also has external irritation will rx kenalog, recheck labs in 3-6 months

## 2013-11-09 ENCOUNTER — Ambulatory Visit (HOSPITAL_COMMUNITY)
Admission: RE | Admit: 2013-11-09 | Payer: BC Managed Care – PPO | Source: Ambulatory Visit | Attending: Internal Medicine | Admitting: Internal Medicine

## 2013-11-09 ENCOUNTER — Ambulatory Visit (HOSPITAL_COMMUNITY)
Admission: RE | Admit: 2013-11-09 | Discharge: 2013-11-09 | Disposition: A | Discharge status: Home / Self Care | Payer: BC Managed Care – PPO | Source: Ambulatory Visit | Attending: Obstetrics and Gynecology | Admitting: Obstetrics and Gynecology

## 2013-11-09 DIAGNOSIS — Z1231 Encounter for screening mammogram for malignant neoplasm of breast: Secondary | ICD-10-CM

## 2013-11-11 ENCOUNTER — Ambulatory Visit (HOSPITAL_COMMUNITY)
Admission: RE | Admit: 2013-11-11 | Discharge: 2013-11-11 | Disposition: A | Discharge status: Home / Self Care | Payer: BC Managed Care – PPO | Source: Ambulatory Visit | Attending: Internal Medicine | Admitting: Internal Medicine

## 2013-11-11 DIAGNOSIS — Z5189 Encounter for other specified aftercare: Secondary | ICD-10-CM | POA: Insufficient documentation

## 2013-11-11 DIAGNOSIS — M25671 Stiffness of right ankle, not elsewhere classified: Secondary | ICD-10-CM | POA: Insufficient documentation

## 2013-11-11 DIAGNOSIS — M25571 Pain in right ankle and joints of right foot: Secondary | ICD-10-CM | POA: Diagnosis not present

## 2013-11-11 NOTE — Therapy (Addendum)
Physical Therapy Treatment  Patient Details  Name: Becky [name] MRN: [id number] Date of Birth: 03/15/1960  Encounter Date: 11/11/2013      PT End of Session - 11/11/13 1635    Visit Number 4   Number of Visits 8   Date for PT Re-Evaluation 11/19/13   PT Start Time 4034   PT Stop Time 1650   PT Time Calculation (min) 44 min   PT Charge Details TE [id number], Manual 1635-1650   Activity Tolerance Patient tolerated treatment well      Past Medical History  Diagnosis Date  . Hypertension   . Thyroid disease   . Back pain   . Migraines   . Vaginal irritation 05/23/2013  . Hematuria 05/23/2013  . BV (bacterial vaginosis) 05/23/2013  . Hyperlipidemia   . Pelvic pain in female 11/02/2013  . Elevated cholesterol 11/02/2013    Past Surgical History  Procedure Laterality Date  . Ectopic pregnancy surgery    . Ileocolonoscopy  12/19/2009    OVF:[id number] polyps/mild left-side diverticulosis/hemorrhoids  . Esophagogastroduodenoscopy  12/19/2009    YSA:YTKZSW stricture s/p dilation/mild gastritis    There were no vitals taken for this visit.  Visit Diagnosis:  Stiffness of ankle joint, right  Pain in joint, ankle and foot, right        [facility] PT Assessment - 11/11/13 1615    Assessment   Medical Diagnosis plantar fascia   Next MD Visit none     Subjective: Pain scale 3-4/10 Bil with Lt dorsal and Rt plantar surface.  Pt feels she is making improvements.  Has been iceing at night and epson salt baths do seem to help. Pain scale 4/10 Bil ankle      [facility] Adult PT Treatment/Exercise - 11/11/13 1618    Ankle Exercises: Stretches   Plantar Fascia Stretch 3 reps;30 seconds;Limitations   Plantar Fascia Stretch Limitations  plantar fascia and dorsum of foot stretch   Soleus Stretch Limitations;3 reps;20 seconds   Soleus Stretch Limitations with wedge under heel   Slant Board Stretch 3 reps;30 seconds   Slant Board Stretch Limitations gastoc and soleus   Other  Stretch [place] st standing with 14in box 3 direction   Other Stretch tibalis anterior stretch 3x30" seated position       11/11/13 1618  Manual Therapy  Manual Therapy Myofascial release  Myofascial Release reduce fascial restrictions of plantar surface and gastroc/soleus complete         PT Short Term Goals - 11/11/13 1848    PT SHORT TERM GOAL #1   Title Pt pain to be no greater than a 5 when getting up from being nonweight bearing for longer than 30 minutes.   Status On-going   PT SHORT TERM GOAL #2   Title Pt to be able to stand for 2 hours without increased pain     Status On-going   PT SHORT TERM GOAL #3   Title Pt to be able to walk for 2 hours without increased pain   Status On-going          PT Long Term Goals - 11/11/13 1849    PT LONG TERM GOAL #1   Title Pt pain to be no greater than a 2/10 when getting up     PT LONG TERM GOAL #2   Title Pt to be able to stand for four hours without pain     PT LONG TERM GOAL #3   Title Pt to be able to walkd for four  hours without increased pain            Plan - 11/11/13 1844    Clinical Impression Statement Continued stretches to improve Bili calf and foot flexibility, added wedge under medial aspect of heel for pain control and to improve inversion.  Manual techniques complete to reduce fascial restrictions of plantar surface and gastroc/soleus complete.  Rt tighter than Lt.  Pt will benefit from increased time spend manual next sessoin to reduce tightness and fascial restrictions.   PT Next Visit Plan Continue with stretches to gastroc, soleus and plantar surface as well as anterior shin.  Pt will benefit from increase manual time to reduce tightness and fascial restrions Rt>Lt of gastroc/soleus complex.          Problem List Patient Active Problem List   Diagnosis Date Noted  . Pelvic pain in female 11/02/2013  . Elevated cholesterol 11/02/2013  . Low back pain 10/20/2013  . Stiffness of ankle joint  10/20/2013  . Pain in joint, ankle and foot 10/20/2013  . Vaginal irritation 05/23/2013  . Hematuria 05/23/2013  . BV (bacterial vaginosis) 05/23/2013  . HEMATOCHEZIA 12/05/2009  . DYSPHAGIA 12/05/2009  . CHEST WALL PAIN, ANTERIOR 06/23/2007  . LEG PAIN 05/18/2007  . VAGINAL PRURITUS 04/16/2007  . GOITER 03/10/2007  . HYPOTHYROIDISM 03/10/2007  . DIABETES MELLITUS, TYPE II 03/10/2007  . HYPERLIPIDEMIA 03/10/2007  . ANEMIA-IRON DEFICIENCY 03/10/2007  . HYPERTENSION 03/10/2007  . RHINITIS, CHRONIC 03/10/2007  . ASTHMA, CHILDHOOD 03/10/2007  . GERD 03/10/2007  . PEPTIC ULCER DISEASE 03/10/2007  . MENOPAUSAL SYNDROME 03/10/2007     Aldona Lento, PTA Aldona Lento 11/11/2013, 6:53 PM

## 2013-11-15 NOTE — Addendum Note (Signed)
Encounter addended by: Susy Frizzle, PTA on: 11/15/2013  5:21 PM<BR>     Documentation filed: Inpatient Document Flowsheet

## 2013-11-15 NOTE — Addendum Note (Signed)
Encounter addended by: Susy Frizzle, PTA on: 11/15/2013  5:25 PM<BR>     Documentation filed: Clinical Notes

## 2013-11-16 ENCOUNTER — Encounter: Payer: Self-pay | Admitting: Adult Health

## 2013-11-16 ENCOUNTER — Ambulatory Visit (INDEPENDENT_AMBULATORY_CARE_PROVIDER_SITE_OTHER): Payer: BC Managed Care – PPO | Admitting: Adult Health

## 2013-11-16 ENCOUNTER — Ambulatory Visit (HOSPITAL_COMMUNITY)
Admission: RE | Admit: 2013-11-16 | Discharge: 2013-11-16 | Disposition: A | Discharge status: Home / Self Care | Payer: BC Managed Care – PPO | Source: Ambulatory Visit | Attending: Internal Medicine | Admitting: Internal Medicine

## 2013-11-16 ENCOUNTER — Ambulatory Visit (INDEPENDENT_AMBULATORY_CARE_PROVIDER_SITE_OTHER): Payer: BC Managed Care – PPO

## 2013-11-16 VITALS — BP 142/78 | Ht 67.0 in | Wt 242.0 lb

## 2013-11-16 DIAGNOSIS — M25571 Pain in right ankle and joints of right foot: Secondary | ICD-10-CM

## 2013-11-16 DIAGNOSIS — M25671 Stiffness of right ankle, not elsewhere classified: Secondary | ICD-10-CM

## 2013-11-16 DIAGNOSIS — R102 Pelvic and perineal pain: Secondary | ICD-10-CM

## 2013-11-16 DIAGNOSIS — D259 Leiomyoma of uterus, unspecified: Secondary | ICD-10-CM

## 2013-11-16 DIAGNOSIS — Z5189 Encounter for other specified aftercare: Secondary | ICD-10-CM | POA: Diagnosis not present

## 2013-11-16 LAB — POCT URINALYSIS DIPSTICK
Blood, UA: NEGATIVE
Glucose, UA: NEGATIVE
Leukocytes, UA: NEGATIVE
Nitrite, UA: NEGATIVE
Protein, UA: NEGATIVE

## 2013-11-16 NOTE — Progress Notes (Signed)
Subjective:     Patient ID: Becky [name], female   DOB: 13-May-1960, 53 y.o.   MRN: [id number]  HPI Becky [name] is a 53 year old black female in for [place] for pelvic pain.  Review of Systems See HPI Reviewed past medical,surgical, social and family history. Reviewed medications and allergies.     Objective:   Physical Exam BP 142/78 mmHg  Ht 5\' 7"  (1.702 m)  Wt 242 lb (109.77 kg)  BMI 37.89 kg/m2Reviewed [place] with pt.urine dipstick was negative today.   Uterus Retroverted 6.3 x 4.8 x 4.6 cm, with multiple fibroids largest=59mm  Endometrium 4.1 mm, distorted by fibroids  Right ovary 1.5 x 1.0 x 0.9 cm,   Left ovary 1.7 x 0.9 x 0.8 cm,   No free fluid or adnexal masses noted within the pelvis  Technician Comments:  Retroverted uterus with multiple fibroids noted within largest=68mm, Endom distorted by fibroids, bilateral adnexa/ovaries appear WNL, no free fluid or adnexal masses noted within the pelvis Will follow for now, if pain increases call.  Assessment:     Pelvic pain Fibroids     Plan:     Follow up prn

## 2013-11-16 NOTE — Therapy (Signed)
Physical Therapy Treatment  Patient Details  Name: Bonetta Mostek MRN: [id number] Date of Birth: 1960-03-11  Encounter Date: 11/16/2013      PT End of Session - 11/16/13 0828    Visit Number 5   Number of Visits 8   Date for PT Re-Evaluation 11/19/13   PT Start Time 0813   PT Stop Time 0845   PT Time Calculation (min) 32 min   PT Charge Details TE [id number]   Activity Tolerance Patient tolerated treatment well      Past Medical History  Diagnosis Date  . Hypertension   . Thyroid disease   . Back pain   . Migraines   . Vaginal irritation 05/23/2013  . Hematuria 05/23/2013  . BV (bacterial vaginosis) 05/23/2013  . Hyperlipidemia   . Pelvic pain in female 11/02/2013  . Elevated cholesterol 11/02/2013    Past Surgical History  Procedure Laterality Date  . Ectopic pregnancy surgery    . Ileocolonoscopy  12/19/2009    EPP:IRJJOACZYSAY polyps/mild left-side diverticulosis/hemorrhoids  . Esophagogastroduodenoscopy  12/19/2009    TKZ:SWFUXN stricture s/p dilation/mild gastritis    There were no vitals taken for this visit.  Visit Diagnosis:  Stiffness of ankle joint, right  Pain in joint, ankle and foot, right      Subjective Assessment - 11/16/13 0817    Symptoms Patient arrived 12 minutes late for appointment.  Pt states her feet do not currently hurt.     Currently in Pain? No/denies            [facility] Adult PT Treatment/Exercise - 11/16/13 0001    Ankle Exercises: Stretches   Plantar Fascia Stretch 3 reps;30 seconds;Limitations   Plantar Fascia Stretch Limitations  plantar fascia and dorsum of foot stretch   Soleus Stretch Limitations;3 reps;20 seconds   Soleus Stretch Limitations slant board   Gastroc Stretch 3 reps;30 seconds;Limitations   Gastroc Stretch Limitations 3 direction with slant board   Other Stretch Ham[address] st standing with 14in box 3 direction   Ankle Exercises: Standing   BAPS Level 3;Standing;5 reps;Limitations   BAPS Limitations  bilaterally   Heel Raises 10 reps   Toe Raise 10 reps            PT Short Term Goals - 11/16/13 0827    PT SHORT TERM GOAL #1   Title Pt pain to be no greater than a 5 when getting up from being nonweight bearing for longer than 30 minutes.   Status On-going   PT SHORT TERM GOAL #2   Title Pt to be able to stand for 2 hours without increased pain     Status On-going   PT SHORT TERM GOAL #3   Title Pt to be able to walk for 2 hours without increased pain   Status On-going          PT Long Term Goals - 11/16/13 0827    PT LONG TERM GOAL #1   Title Pt pain to be no greater than a 2/10 when getting up     Time 4   Period Weeks   Status On-going   PT LONG TERM GOAL #2   Title Pt to be able to stand for four hours without pain     Time 4   Period Weeks   Status On-going   PT LONG TERM GOAL #3   Title Pt to be able to walkd for four hours without increased pain     Time 4   Period Weeks  Status On-going          Plan - 11/16/13 0829    Clinical Impression Statement Continued with stretches to increase bilateral LE and foot flexibility.  Progressed exercises today as patient reported no pain.  Added heel/toe raises and BAPS in standing to progress foot and ankle stabilty.  Held manual therapy today as patient was painfree and decreaed treatment time   PT Next Visit Plan Continue with stretches to gastroc, soleus and plantar surface as well as anterior shin.  Assess results from newly added exericses and how patient did without manual therapy.          Problem List Patient Active Problem List   Diagnosis Date Noted  . Pelvic pain in female 11/02/2013  . Elevated cholesterol 11/02/2013  . Low back pain 10/20/2013  . Stiffness of ankle joint 10/20/2013  . Pain in joint, ankle and foot 10/20/2013  . Vaginal irritation 05/23/2013  . Hematuria 05/23/2013  . BV (bacterial vaginosis) 05/23/2013  . HEMATOCHEZIA 12/05/2009  . DYSPHAGIA 12/05/2009  . CHEST WALL PAIN,  ANTERIOR 06/23/2007  . LEG PAIN 05/18/2007  . VAGINAL PRURITUS 04/16/2007  . GOITER 03/10/2007  . HYPOTHYROIDISM 03/10/2007  . DIABETES MELLITUS, TYPE II 03/10/2007  . HYPERLIPIDEMIA 03/10/2007  . ANEMIA-IRON DEFICIENCY 03/10/2007  . HYPERTENSION 03/10/2007  . RHINITIS, CHRONIC 03/10/2007  . ASTHMA, CHILDHOOD 03/10/2007  . GERD 03/10/2007  . PEPTIC ULCER DISEASE 03/10/2007  . MENOPAUSAL SYNDROME 03/10/2007          Teena Irani, PTA/CLT 11/16/2013, 8:49 AM

## 2013-11-18 ENCOUNTER — Ambulatory Visit (HOSPITAL_COMMUNITY)
Admission: RE | Admit: 2013-11-18 | Discharge: 2013-11-18 | Disposition: A | Discharge status: Home / Self Care | Payer: BC Managed Care – PPO | Source: Ambulatory Visit | Attending: Internal Medicine | Admitting: Internal Medicine

## 2013-11-18 DIAGNOSIS — M25571 Pain in right ankle and joints of right foot: Secondary | ICD-10-CM

## 2013-11-18 DIAGNOSIS — M25671 Stiffness of right ankle, not elsewhere classified: Secondary | ICD-10-CM

## 2013-11-18 DIAGNOSIS — Z5189 Encounter for other specified aftercare: Secondary | ICD-10-CM | POA: Diagnosis not present

## 2013-11-18 NOTE — Therapy (Signed)
Physical Therapy Treatment  Patient Details  Name: Becky [name] MRN: [id number] Date of Birth: 1960/09/25  Encounter Date: 11/18/2013      PT End of Session - 11/18/13 1847    Visit Number 6   Number of Visits 8   Date for PT Re-Evaluation 11/19/13   PT Start Time 4098   PT Stop Time 1191   PT Time Calculation (min) 45 min   PT Charge Details TE [phone], Manual [id number]   Activity Tolerance Patient tolerated treatment well   Behavior During Therapy [facility] for tasks assessed/performed      Past Medical History  Diagnosis Date  . Hypertension   . Thyroid disease   . Back pain   . Migraines   . Vaginal irritation 05/23/2013  . Hematuria 05/23/2013  . BV (bacterial vaginosis) 05/23/2013  . Hyperlipidemia   . Pelvic pain in female 11/02/2013  . Elevated cholesterol 11/02/2013    Past Surgical History  Procedure Laterality Date  . Ectopic pregnancy surgery    . Ileocolonoscopy  12/19/2009    ZHY:[id number] polyps/mild left-side diverticulosis/hemorrhoids  . Esophagogastroduodenoscopy  12/19/2009    MWU:XLKGMW stricture s/p dilation/mild gastritis    There were no vitals taken for this visit.  Visit Diagnosis:  Stiffness of ankle joint, right  Pain in joint, ankle and foot, right      Subjective Assessment - 11/18/13 1611    Symptoms Pt stated LE is stiff today, current pain scale 6/10 on Rt medial gastoc/ankle region.  Reports stretches assist with pain.  Reports increased pain without manual last session, stated applied ice last session for pain control.  Pt did like new activities complete kast sessuib,     Currently in Pain? Yes   Pain Score 6    Pain Location Ankle   Pain Orientation Right            [facility] Adult PT Treatment/Exercise - 11/18/13 0001    Manual Therapy   Manual Therapy Myofascial release   Myofascial Release Prone position with MFR to plantar surface and STM to gastroc/soleus complex Bil LE   Ankle Exercises: Stretches   Plantar  Fascia Stretch 3 reps;30 seconds;Limitations   Plantar Fascia Stretch Limitations  plantar fascia and dorsum of foot stretch   Soleus Stretch Limitations;3 reps;20 seconds   Soleus Stretch Limitations slant board   Gastroc Stretch 3 reps;30 seconds;Limitations   Gastroc Stretch Limitations 3 direction with slant board   Slant Board Stretch 3 reps;30 seconds   Other Stretch Hamsting st standing with 14in box 3 direction   Ankle Exercises: Standing   BAPS Level 3;Standing;5 reps;Limitations   BAPS Limitations bilaterally          PT Education - 11/18/13 1852    Education provided Yes   Education Details Techniques for pain control (freeze 1/2 water bottle and roll on floor, importance of stretches) and proper support in shoes while standing for long periods of time.     Person(s) Educated Patient   Methods Explanation   Comprehension Verbalized understanding          PT Short Term Goals - 11/18/13 1851    PT SHORT TERM GOAL #1   Title Pt pain to be no greater than a 5 when getting up from being nonweight bearing for longer than 30 minutes.   Status On-going   PT SHORT TERM GOAL #2   Title Pt to be able to stand for 2 hours without increased pain     PT SHORT  TERM GOAL #3   Title Pt to be able to walk for 2 hours without increased pain          PT Long Term Goals - 11/18/13 1851    PT LONG TERM GOAL #1   Title Pt pain to be no greater than a 2/10 when getting up     PT LONG TERM GOAL #2   Title Pt to be able to stand for four hours without pain     PT LONG TERM GOAL #3   Title Pt to be able to walkd for four hours without increased pain            Plan - 11/18/13 1848    Clinical Impression Statement Continued with stretches to increased BIl LE and foot flexibilty.  Continued with BAPS to improve coordination, stabiltiy and ARPM with cueing to improve techniques and reduce compensation with hip and UE assistance.  Resumed manual therapy due to increased pain  following work with vast improvements in pain reduction to 2/10 Bil LE.  Pt educated on techniques for pain control and proper support with shoes while working 8 hour days.     PT Next Visit Plan Continue with stretches to gastroc, soleus and plantar surface as well as anterior shin.  Continue stability exercises in standing.          Problem List Patient Active Problem List   Diagnosis Date Noted  . Pelvic pain in female 11/02/2013  . Elevated cholesterol 11/02/2013  . Low back pain 10/20/2013  . Stiffness of ankle joint 10/20/2013  . Pain in joint, ankle and foot 10/20/2013  . Vaginal irritation 05/23/2013  . Hematuria 05/23/2013  . BV (bacterial vaginosis) 05/23/2013  . HEMATOCHEZIA 12/05/2009  . DYSPHAGIA 12/05/2009  . CHEST WALL PAIN, ANTERIOR 06/23/2007  . LEG PAIN 05/18/2007  . VAGINAL PRURITUS 04/16/2007  . GOITER 03/10/2007  . HYPOTHYROIDISM 03/10/2007  . DIABETES MELLITUS, TYPE II 03/10/2007  . HYPERLIPIDEMIA 03/10/2007  . ANEMIA-IRON DEFICIENCY 03/10/2007  . HYPERTENSION 03/10/2007  . RHINITIS, CHRONIC 03/10/2007  . ASTHMA, CHILDHOOD 03/10/2007  . GERD 03/10/2007  . PEPTIC ULCER DISEASE 03/10/2007  . MENOPAUSAL SYNDROME 03/10/2007     Aldona Lento, PTA Aldona Lento 11/18/2013, 6:53 PM

## 2013-11-23 ENCOUNTER — Ambulatory Visit (HOSPITAL_COMMUNITY)
Admission: RE | Admit: 2013-11-23 | Payer: BC Managed Care – PPO | Source: Ambulatory Visit | Attending: Internal Medicine | Admitting: Internal Medicine

## 2013-11-25 ENCOUNTER — Ambulatory Visit (HOSPITAL_COMMUNITY)
Admission: RE | Admit: 2013-11-25 | Discharge: 2013-11-25 | Disposition: A | Discharge status: Home / Self Care | Payer: BC Managed Care – PPO | Source: Ambulatory Visit | Attending: Internal Medicine | Admitting: Internal Medicine

## 2013-11-25 DIAGNOSIS — Z5189 Encounter for other specified aftercare: Secondary | ICD-10-CM | POA: Diagnosis not present

## 2013-11-25 DIAGNOSIS — M25671 Stiffness of right ankle, not elsewhere classified: Secondary | ICD-10-CM

## 2013-11-25 DIAGNOSIS — M25571 Pain in right ankle and joints of right foot: Secondary | ICD-10-CM

## 2013-11-25 NOTE — Patient Instructions (Signed)
Achilles / Soleus, Standing   Stand, right foot behind, heel on floor and turned slightly out. Lower hips and bend knees. Hold 30  seconds. Repeat 3 times per session. Do 3 sessions per day.  Copyright  VHI. All rights reserved.  Calf Stretch   Stand with hands supported on wall, elbows slightly bent, front knee bent, back knee straight, feet parallel and both heels on floor. Lean into wall by pushing hips forward until a stretch is felt in calf muscle. Hold 30  seconds. Repeat with leg positions switched.  Copyright  VHI. All rights reserved.   Plantar fascia stretch  Standing on step, drop heel down and hold 30 seconds, repeat 3 times.  Daily

## 2013-11-25 NOTE — Therapy (Addendum)
Physical Therapy Reassessment/ Treatment Note  Patient Details  Name: Becky [name] MRN: [id number] Date of Birth: 05-13-1960  Encounter Date: 11/25/2013      PT End of Session - 11/25/13 1618    Visit Number 7   Number of Visits 8   PT Start Time 8280   PT Stop Time 0349   PT Time Calculation (min) 40 min   PT Charge Details TE [id number], ROM/MMT [id number], Manual [phone]   Activity Tolerance Patient tolerated treatment well   Behavior During Therapy [facility] for tasks assessed/performed      Past Medical History  Diagnosis Date  . Hypertension   . Thyroid disease   . Back pain   . Migraines   . Vaginal irritation 05/23/2013  . Hematuria 05/23/2013  . BV (bacterial vaginosis) 05/23/2013  . Hyperlipidemia   . Pelvic pain in female 11/02/2013  . Elevated cholesterol 11/02/2013    Past Surgical History  Procedure Laterality Date  . Ectopic pregnancy surgery    . Ileocolonoscopy  12/19/2009    PVX:[id number] polyps/mild left-side diverticulosis/hemorrhoids  . Esophagogastroduodenoscopy  12/19/2009    LMB:EMLJQG stricture s/p dilation/mild gastritis    There were no vitals taken for this visit.  Visit Diagnosis:  Stiffness of ankle joint, right  Pain in joint, ankle and foot, right      Subjective Assessment - 11/25/13 1616    Symptoms Pt stated she purchased new shoes with better support, pain scale 2/10  Reports compliance with HEP.   How long can you sit comfortably? sitting is fine but once she gets up from sitting she has increased pain.    How long can you stand comfortably? 6 hours (several hours )   How long can you walk comfortably? several hours she will have pain but she can push through the pain.    Currently in Pain? Yes   Pain Score 2    Pain Location Ankle   Pain Orientation Right          [employer] PT Assessment - 11/25/13 1629    Assessment   Medical Diagnosis plantar fascia   Next MD Visit [facility] unscheduled   AROM   Right Ankle  Dorsiflexion 5  was 5   Right Ankle Plantar Flexion 58  was 40   Right Ankle Inversion 45  was 30   Right Ankle Eversion 20  was 18   Strength   Right Ankle Dorsiflexion --  4+/5 was 4/5   Right Ankle Plantar Flexion --  4+/5 was 4/5   Right Ankle Inversion 5/5   Right Ankle Eversion --  4+/5 was 4/5          [facility] Adult PT Treatment/Exercise - 11/25/13 1850    Manual Therapy   Manual Therapy Myofascial release   Ankle Exercises: Stretches   Plantar Fascia Stretch 3 reps;30 seconds;Limitations   Plantar Fascia Stretch Limitations  plantar fascia and dorsum of foot stretch   Soleus Stretch Limitations;3 reps;20 seconds   Soleus Stretch Limitations slant board   Gastroc Stretch 3 reps;30 seconds;Limitations   Gastroc Stretch Limitations 3 direction with slant board   Slant Board Stretch 3 reps;30 seconds   Other Stretch Hamsting st standing with 14in box 3 direction          PT Education - 11/25/13 1850    Education provided Yes   Education Details Reviewed stretches and pt given new printout for gastroc, soleus and plantar fascia stretches   Person(s) Educated Patient   Methods Explanation;Demonstration;Verbal  cues;Handout   Comprehension Verbalized understanding;Returned demonstration          PT Short Term Goals - 11/25/13 1618    PT SHORT TERM GOAL #1   Title Pt pain to be no greater than a 5 when getting up from being nonweight bearing for longer than 30 minutes.   Status Not Met   PT SHORT TERM GOAL #2   Title Pt to be able to stand for 2 hours without increased pain     Status Achieved   PT SHORT TERM GOAL #3   Title Pt to be able to walk for 2 hours without increased pain   Status Achieved          PT Long Term Goals - 11/25/13 1619    PT LONG TERM GOAL #1   Title Pt pain to be no greater than a 2/10 when getting up     Status Not Met   PT LONG TERM GOAL #2   Title Pt to be able to stand for four hours without pain     Status Achieved   PT  LONG TERM GOAL #3   Title Pt to be able to walkd for four hours without increased pain     Status Achieved          Plan - 11/25/13 1852    Clinical Impression Statement Reassessment complete with the following findings:  Pt stated compliant with and able to demonstrate HEP.  Pt has met 2/3 STGs and 2/3s LTGs.  Pt stated she has imporved tolerance with stannding and walking though pain does continue.  ROM and stregth have improved.  Improved perceived functional abilities with increased FOTO score from 55 to 59%  Pt will continue to benefit from skilled intervention to improve ankle dorsiflexion and overall ROM, strengthening and pain control.    PT Next Visit Plan Recommend continuing OPPT for 2x 4 more weeks to improve dorsiflexion, overall strength, and pain control towards PT POC.  Begin 3D ankle excursion against wall, knee drivers and continue stretches.        Problem List Patient Active Problem List   Diagnosis Date Noted  . Pelvic pain in female 11/02/2013  . Elevated cholesterol 11/02/2013  . Low back pain 10/20/2013  . Stiffness of ankle joint 10/20/2013  . Pain in joint, ankle and foot 10/20/2013  . Vaginal irritation 05/23/2013  . Hematuria 05/23/2013  . BV (bacterial vaginosis) 05/23/2013  . HEMATOCHEZIA 12/05/2009  . DYSPHAGIA 12/05/2009  . CHEST WALL PAIN, ANTERIOR 06/23/2007  . LEG PAIN 05/18/2007  . VAGINAL PRURITUS 04/16/2007  . GOITER 03/10/2007  . HYPOTHYROIDISM 03/10/2007  . DIABETES MELLITUS, TYPE II 03/10/2007  . HYPERLIPIDEMIA 03/10/2007  . ANEMIA-IRON DEFICIENCY 03/10/2007  . HYPERTENSION 03/10/2007  . RHINITIS, CHRONIC 03/10/2007  . ASTHMA, CHILDHOOD 03/10/2007  . GERD 03/10/2007  . PEPTIC ULCER DISEASE 03/10/2007  . MENOPAUSAL SYNDROME 03/10/2007   Ihor Austin, Lodge Aldona Lento 11/25/2013, 7:04 PM    Your signature is required to indicate approval of the treatment plan as stated above.  Please sign and return  making a copy for your files.  You may hard copy or send electronically.  Please check one: ___1.  Approve of this plan  ___2.  Approve of this plan with the following changes.   ____________________________                             _____________ Physician  Date

## 2013-11-28 ENCOUNTER — Ambulatory Visit (HOSPITAL_COMMUNITY)
Admission: RE | Admit: 2013-11-28 | Discharge: 2013-11-28 | Disposition: A | Discharge status: Home / Self Care | Payer: BC Managed Care – PPO | Source: Ambulatory Visit | Attending: Internal Medicine | Admitting: Internal Medicine

## 2013-11-28 DIAGNOSIS — M25671 Stiffness of right ankle, not elsewhere classified: Secondary | ICD-10-CM

## 2013-11-28 DIAGNOSIS — M25571 Pain in right ankle and joints of right foot: Secondary | ICD-10-CM

## 2013-11-28 DIAGNOSIS — Z5189 Encounter for other specified aftercare: Secondary | ICD-10-CM | POA: Diagnosis not present

## 2013-11-28 NOTE — Addendum Note (Signed)
Encounter addended by: Leeroy Cha, PT on: 11/28/2013  2:43 PM<BR>     Documentation filed: Fast Note

## 2013-11-28 NOTE — Addendum Note (Signed)
Encounter addended by: Leeroy Cha, PT on: 11/28/2013  2:46 PM<BR>     Documentation filed: Clinical Notes

## 2013-11-28 NOTE — Therapy (Signed)
Physical Therapy Treatment  Patient Details  Name: Becky [name] MRN: [id number] Date of Birth: 1960-07-15  Encounter Date: 11/28/2013      PT End of Session - 11/28/13 1831    Visit Number 8   Number of Visits 16   Date for PT Re-Evaluation 12/23/13   PT Start Time 2449   PT Stop Time 1817   PT Time Calculation (min) 42 min   Activity Tolerance Patient tolerated treatment well   Behavior During Therapy [facility] for tasks assessed/performed      Past Medical History  Diagnosis Date  . Hypertension   . Thyroid disease   . Back pain   . Migraines   . Vaginal irritation 05/23/2013  . Hematuria 05/23/2013  . BV (bacterial vaginosis) 05/23/2013  . Hyperlipidemia   . Pelvic pain in female 11/02/2013  . Elevated cholesterol 11/02/2013    Past Surgical History  Procedure Laterality Date  . Ectopic pregnancy surgery    . Ileocolonoscopy  12/19/2009    PNP:YYFRTMYTRZNB polyps/mild left-side diverticulosis/hemorrhoids  . Esophagogastroduodenoscopy  12/19/2009    VAP:OLIDCV stricture s/p dilation/mild gastritis    There were no vitals taken for this visit.  Visit Diagnosis:  Stiffness of ankle joint, right  Pain in joint, ankle and foot, right      Subjective Assessment - 11/28/13 1739    Symptoms Patient states having a busy day where she was on her feet all day walking in th [place] resultign in minor increase in pain.    Currently in Pain? Yes   Pain Score 5    Pain Location Ankle   Pain Orientation Right   Pain Type Chronic pain   Pain Onset More than a month ago            [facility] Adult PT Treatment/Exercise - 11/28/13 0001    Manual Therapy   Manual Therapy Joint mobilization;Myofascial release   Joint Mobilization foot mobility WNL, joint mobilizations at grade 2 for pain.   Myofascial Release gastrocs, soleu, acitvely inflamed, only soft tissue mobilization completed.    Ankle Exercises: Stretches   Gastroc Stretch Limitations 3D ankle excursion at wall 10x  each   Slant Board Stretch 3 reps;20 seconds;Limitations  3x each of 3 ways   Ankle Exercises: Standing   Other Standing Ankle Exercises Sumo walk, reverse monster walk, forward monsterwalk 71f each with blue Tband          PT Education - 11/28/13 1830    Education Details Introduced activitiess to improve transition zoen 2 hip abduction gait mechanics  including supo walking, monster wlaking, and reverse monster walking.    Person(s) Educated Patient   Methods Explanation;Demonstration;Verbal cues   Comprehension Verbalized understanding;Returned demonstration;Need further instruction          PT Short Term Goals - 11/28/13 1840    PT SHORT TERM GOAL #1   Title Pt pain to be no greater than a 5 when getting up from being nonweight bearing for longer than 30 minutes.   Status Not Met   PT SHORT TERM GOAL #2   Title Pt to be able to stand for 2 hours without increased pain     Status Achieved   PT SHORT TERM GOAL #3   Title Pt to be able to walk for 2 hours without increased pain   Status Achieved          PT Long Term Goals - 11/28/13 1840    PT LONG TERM GOAL #1   Title  Pt pain to be no greater than a 2/10 when getting up     Status Not Met   PT LONG TERM GOAL #2   Title Pt to be able to stand for four hours without pain     Status Achieved   PT LONG TERM GOAL #3   Title Pt to be able to walkd for four hours without increased pain     Status Achieved          Plan - 11/28/13 1832    Clinical Impression Statement Session began with stretchign and manial work to increase patient's still limited ankle dorsiflexion. Obvious observation that patienhts pain was attributed to swelling in medial ankle resultign in excessive tenderness to touch along medial calcaneaus. gait was observed with patient displaying minimally impaired transitiona zone 1 mechanics of fott strike/flat, but durign transistion phase 2 (just prior to heel off) patient dispalyed siginifcant hip  deviation to Rt resulting in excessive adduction moment at hip, attributed to glut med/max weakness, and excessive calcaneus eversion moment and  subsequent excessive strain on medial heel gastron soleus attachment. Patient instructed in need for increased glute/trunk stability and given Blue theraband to take home for glute strengtheing exercises. patient noted significantly decreased pain at end of session.    PT Next Visit Plan Progress glut strenghtenign to imrpove gait mechaincs, promoting gait transition zone 2 hip abduction, and utlize strething of sartorious and gastrocs for pain relief.         Problem List Patient Active Problem List   Diagnosis Date Noted  . Pelvic pain in female 11/02/2013  . Elevated cholesterol 11/02/2013  . Low back pain 10/20/2013  . Stiffness of ankle joint 10/20/2013  . Pain in joint, ankle and foot 10/20/2013  . Vaginal irritation 05/23/2013  . Hematuria 05/23/2013  . BV (bacterial vaginosis) 05/23/2013  . HEMATOCHEZIA 12/05/2009  . DYSPHAGIA 12/05/2009  . CHEST WALL PAIN, ANTERIOR 06/23/2007  . LEG PAIN 05/18/2007  . VAGINAL PRURITUS 04/16/2007  . GOITER 03/10/2007  . HYPOTHYROIDISM 03/10/2007  . DIABETES MELLITUS, TYPE II 03/10/2007  . HYPERLIPIDEMIA 03/10/2007  . ANEMIA-IRON DEFICIENCY 03/10/2007  . HYPERTENSION 03/10/2007  . RHINITIS, CHRONIC 03/10/2007  . ASTHMA, CHILDHOOD 03/10/2007  . GERD 03/10/2007  . PEPTIC ULCER DISEASE 03/10/2007  . MENOPAUSAL SYNDROME 03/10/2007    Devona Konig PT DPT [phone]

## 2013-11-28 NOTE — Patient Instructions (Signed)
Sumo walk, monster walk, reverse monster walk 89ft each 1x every other day.

## 2013-11-30 ENCOUNTER — Ambulatory Visit (HOSPITAL_COMMUNITY): Payer: BC Managed Care – PPO | Admitting: Physical Therapy

## 2013-12-07 ENCOUNTER — Ambulatory Visit (HOSPITAL_COMMUNITY): Payer: BC Managed Care – PPO | Admitting: Physical Therapy

## 2013-12-07 DIAGNOSIS — M25571 Pain in right ankle and joints of right foot: Secondary | ICD-10-CM | POA: Insufficient documentation

## 2013-12-07 DIAGNOSIS — Z5189 Encounter for other specified aftercare: Secondary | ICD-10-CM | POA: Insufficient documentation

## 2013-12-07 DIAGNOSIS — M25671 Stiffness of right ankle, not elsewhere classified: Secondary | ICD-10-CM | POA: Insufficient documentation

## 2013-12-14 ENCOUNTER — Ambulatory Visit (HOSPITAL_COMMUNITY)
Admission: RE | Admit: 2013-12-14 | Discharge: 2013-12-14 | Disposition: A | Discharge status: Home / Self Care | Payer: BC Managed Care – PPO | Source: Ambulatory Visit | Attending: Internal Medicine | Admitting: Internal Medicine

## 2013-12-14 DIAGNOSIS — Z5189 Encounter for other specified aftercare: Secondary | ICD-10-CM | POA: Diagnosis not present

## 2013-12-14 DIAGNOSIS — M25571 Pain in right ankle and joints of right foot: Secondary | ICD-10-CM | POA: Diagnosis not present

## 2013-12-14 DIAGNOSIS — M25671 Stiffness of right ankle, not elsewhere classified: Secondary | ICD-10-CM

## 2013-12-14 NOTE — Therapy (Signed)
[facility] [address] [place], [place], [address] Phone: [phone]   Fax:  [phone]  Physical Therapy Treatment  Patient Details  Name: Becky [name] MRN: [id number] Date of Birth: 01-16-1960  Encounter Date: 12/14/2013      PT End of Session - 12/14/13 1627    Visit Number 9   Number of Visits 16   Date for PT Re-Evaluation 12/23/13   PT Start Time 1402   PT Stop Time 1433   PT Time Calculation (min) 31 min   Activity Tolerance Patient tolerated treatment well   Behavior During Therapy [facility] for tasks assessed/performed      Past Medical History  Diagnosis Date  . Hypertension   . Thyroid disease   . Back pain   . Migraines   . Vaginal irritation 05/23/2013  . Hematuria 05/23/2013  . BV (bacterial vaginosis) 05/23/2013  . Hyperlipidemia   . Pelvic pain in female 11/02/2013  . Elevated cholesterol 11/02/2013    Past Surgical History  Procedure Laterality Date  . Ectopic pregnancy surgery    . Ileocolonoscopy  12/19/2009    FHL:[id number] polyps/mild left-side diverticulosis/hemorrhoids  . Esophagogastroduodenoscopy  12/19/2009    SKA:JGOTLX stricture s/p dilation/mild gastritis    There were no vitals taken for this visit.  Visit Diagnosis:  Stiffness of ankle joint, right  Pain in joint, ankle and foot, right      Subjective Assessment - 12/14/13 1650    Symptoms Pt came 15 minutes late for appointment.  STates she has been missing therapy due to working/busy schedule.  Pt reports currently with 8/10 pain in plantar Rt heel.   Currently in Pain? Yes   Pain Score 8    Pain Location Ankle   Pain Orientation Right            [facility] Adult PT Treatment/Exercise - 12/14/13 1408    Ultrasound   Ultrasound Location Rt plantar surface/medial heel   Ultrasound Parameters 1.5 w/cm2 continuously 8 minutes   Ultrasound Goals Pain   Manual Therapy   Manual Therapy Joint mobilization;Myofascial release   Joint  Mobilization to increase mobility   Myofascial Release plantar fascia, gastroc/soleus   Ankle Exercises: Stretches   Slant Board Stretch 3 reps;20 seconds;Limitations            PT Short Term Goals - 12/14/13 1634    PT SHORT TERM GOAL #1   Title Pt pain to be no greater than a 5 when getting up from being nonweight bearing for longer than 30 minutes.   Status Not Met   PT SHORT TERM GOAL #2   Title Pt to be able to stand for 2 hours without increased pain     Status Achieved   PT SHORT TERM GOAL #3   Title Pt to be able to walk for 2 hours without increased pain   Status Achieved          PT Long Term Goals - 12/14/13 1634    PT LONG TERM GOAL #1   Title Pt pain to be no greater than a 2/10 when getting up     Status Not Met   PT LONG TERM GOAL #2   Title Pt to be able to stand for four hours without pain     Status Achieved   PT LONG TERM GOAL #3   Title Pt to be able to walkd for four hours without increased pain     Status Achieved  Plan - 12/14/13 1627    Clinical Impression Statement Pt has not been to therapy X 2.5 weeks.  Pt comes today with noted antalgia stating she has not had any lasting relief.  States she plans on returning to MD to inguire about insoles vs shots.  Began [place] today to help decrease the pain f/b manual techniques to decrease adhesions and pain.  Pt reported reduction of pain 4 levels at end of session   PT Next Visit Plan Begin 3D ankle excursion against wall, knee drivers and continue stretches. Inquire about lasting affects of [place].                               Problem List Patient Active Problem List   Diagnosis Date Noted  . Pelvic pain in female 11/02/2013  . Elevated cholesterol 11/02/2013  . Low back pain 10/20/2013  . Stiffness of ankle joint 10/20/2013  . Pain in joint, ankle and foot 10/20/2013  . Vaginal irritation 05/23/2013  . Hematuria 05/23/2013  . BV (bacterial vaginosis) 05/23/2013   . HEMATOCHEZIA 12/05/2009  . DYSPHAGIA 12/05/2009  . CHEST WALL PAIN, ANTERIOR 06/23/2007  . LEG PAIN 05/18/2007  . VAGINAL PRURITUS 04/16/2007  . GOITER 03/10/2007  . HYPOTHYROIDISM 03/10/2007  . DIABETES MELLITUS, TYPE II 03/10/2007  . HYPERLIPIDEMIA 03/10/2007  . ANEMIA-IRON DEFICIENCY 03/10/2007  . HYPERTENSION 03/10/2007  . RHINITIS, CHRONIC 03/10/2007  . ASTHMA, CHILDHOOD 03/10/2007  . GERD 03/10/2007  . PEPTIC ULCER DISEASE 03/10/2007  . MENOPAUSAL SYNDROME 03/10/2007    Teena Irani, PTA/CLT [phone] 12/14/2013, 4:54 PM

## 2013-12-15 ENCOUNTER — Ambulatory Visit (HOSPITAL_COMMUNITY)
Admission: RE | Admit: 2013-12-15 | Discharge: 2013-12-15 | Disposition: A | Discharge status: Home / Self Care | Payer: BC Managed Care – PPO | Source: Ambulatory Visit | Attending: Internal Medicine | Admitting: Internal Medicine

## 2013-12-15 DIAGNOSIS — M25571 Pain in right ankle and joints of right foot: Secondary | ICD-10-CM

## 2013-12-15 DIAGNOSIS — M25671 Stiffness of right ankle, not elsewhere classified: Secondary | ICD-10-CM

## 2013-12-15 DIAGNOSIS — Z5189 Encounter for other specified aftercare: Secondary | ICD-10-CM | POA: Diagnosis not present

## 2013-12-15 NOTE — Therapy (Signed)
[facility] [address] [place], [place], [address] Phone: [phone]   Fax:  [phone]  Physical Therapy Treatment  Patient Details  Name: Becky [name] MRN: [id number] Date of Birth: 04/01/1960  Encounter Date: 12/15/2013      PT End of Session - 12/15/13 1129    Visit Number 10   Number of Visits 16   Date for PT Re-Evaluation 12/23/13   Activity Tolerance Patient tolerated treatment well   Behavior During Therapy [facility] for tasks assessed/performed      Past Medical History  Diagnosis Date  . Hypertension   . Thyroid disease   . Back pain   . Migraines   . Vaginal irritation 05/23/2013  . Hematuria 05/23/2013  . BV (bacterial vaginosis) 05/23/2013  . Hyperlipidemia   . Pelvic pain in female 11/02/2013  . Elevated cholesterol 11/02/2013    Past Surgical History  Procedure Laterality Date  . Ectopic pregnancy surgery    . Ileocolonoscopy  12/19/2009    ZTI:[id number] polyps/mild left-side diverticulosis/hemorrhoids  . Esophagogastroduodenoscopy  12/19/2009    KNL:ZJQBHA stricture s/p dilation/mild gastritis    There were no vitals taken for this visit.  Visit Diagnosis:  Stiffness of ankle joint, right  Pain in joint, ankle and foot, right      Subjective Assessment - 12/15/13 1133    Symptoms Pt reports the ultrasound helped last visit.  Overall reduction of pain into the evening currently with 4/10 pain.  Pt also reports she slept most of the night with her night splint on and used some heat/massage which additionally helped decrease symptoms.     Currently in Pain? Yes   Pain Score 4    Pain Location Ankle   Pain Orientation Right;Medial            [facility] Adult PT Treatment/Exercise - 12/15/13 0811    Ultrasound   Ultrasound Location Rt plantar medial heel and medial arch   Ultrasound Parameters 1.5 w/cm2 continously 8 minutes   Ultrasound Goals Pain   Manual Therapy   Manual Therapy Joint  mobilization;Myofascial release   Joint Mobilization to increase mobility/decrease pain   Myofascial Release PF, gastroc, soleus Rt LE in prone position   Ankle Exercises: Stretches   Gastroc Stretch Limitations 3D ankle excursion at wall 10x each   Slant Board Stretch 3 reps;Limitations;30 seconds   Slant Board Stretch Limitations gastoc and soleus            PT Short Term Goals - 12/15/13 1129    PT SHORT TERM GOAL #1   Title Pt pain to be no greater than a 5 when getting up from being nonweight bearing for longer than 30 minutes.   Status Not Met   PT SHORT TERM GOAL #2   Title Pt to be able to stand for 2 hours without increased pain     Status Achieved   PT SHORT TERM GOAL #3   Title Pt to be able to walk for 2 hours without increased pain   Status Achieved          PT Long Term Goals - 12/15/13 1129    PT LONG TERM GOAL #1   Title Pt pain to be no greater than a 2/10 when getting up     Status Not Met   PT LONG TERM GOAL #2   Title Pt to be able to stand for four hours without pain     Status Achieved   PT LONG TERM GOAL #  3   Title Pt to be able to walkd for four hours without increased pain     Status Achieved          Plan - 12/15/13 1130    Clinical Impression Statement Continued with [place] prior to manual as patient reported relief following session yesterday.  REsumed 3D ankle excursions without increased pain and continued stretches.  PT wearing her SAS shoes today for her 8 hour work shift.   Noted tightness in the gastroc/soleus complex and plantar fascia with manual techniques.  Encouraged patient to continue massages, ice and wearing of night splint along with HEP.  PT verbalized understanding.  PT with noted reduction in antalgia and reported reduction of pain at end of session.     PT Next Visit Plan Progress glut strenghtenign to imrpove gait mechaincs, promoting gait transition zone 2 hip abduction, and utlize strething of sartorious and gastrocs for  pain relief.   Continue to progress stability exercises.                               Problem List Patient Active Problem List   Diagnosis Date Noted  . Pelvic pain in female 11/02/2013  . Elevated cholesterol 11/02/2013  . Low back pain 10/20/2013  . Stiffness of ankle joint 10/20/2013  . Pain in joint, ankle and foot 10/20/2013  . Vaginal irritation 05/23/2013  . Hematuria 05/23/2013  . BV (bacterial vaginosis) 05/23/2013  . HEMATOCHEZIA 12/05/2009  . DYSPHAGIA 12/05/2009  . CHEST WALL PAIN, ANTERIOR 06/23/2007  . LEG PAIN 05/18/2007  . VAGINAL PRURITUS 04/16/2007  . GOITER 03/10/2007  . HYPOTHYROIDISM 03/10/2007  . DIABETES MELLITUS, TYPE II 03/10/2007  . HYPERLIPIDEMIA 03/10/2007  . ANEMIA-IRON DEFICIENCY 03/10/2007  . HYPERTENSION 03/10/2007  . RHINITIS, CHRONIC 03/10/2007  . ASTHMA, CHILDHOOD 03/10/2007  . GERD 03/10/2007  . PEPTIC ULCER DISEASE 03/10/2007  . MENOPAUSAL SYNDROME 03/10/2007    Teena Irani, PTA/CLT [phone] 12/15/2013, 11:38 AM

## 2013-12-21 ENCOUNTER — Ambulatory Visit (HOSPITAL_COMMUNITY): Payer: BC Managed Care – PPO | Admitting: Physical Therapy

## 2013-12-22 ENCOUNTER — Ambulatory Visit (HOSPITAL_COMMUNITY): Payer: BC Managed Care – PPO | Admitting: Physical Therapy

## 2013-12-28 ENCOUNTER — Ambulatory Visit (HOSPITAL_COMMUNITY): Payer: BC Managed Care – PPO | Admitting: Physical Therapy

## 2013-12-29 ENCOUNTER — Ambulatory Visit (HOSPITAL_COMMUNITY): Payer: BC Managed Care – PPO | Admitting: Physical Therapy

## 2014-01-03 ENCOUNTER — Ambulatory Visit (HOSPITAL_COMMUNITY): Payer: BC Managed Care – PPO | Admitting: Physical Therapy

## 2014-01-05 ENCOUNTER — Ambulatory Visit (HOSPITAL_COMMUNITY)
Admission: RE | Admit: 2014-01-05 | Discharge: 2014-01-05 | Disposition: A | Discharge status: Home / Self Care | Payer: BC Managed Care – PPO | Source: Ambulatory Visit | Attending: Internal Medicine | Admitting: Internal Medicine

## 2014-01-05 DIAGNOSIS — Z5189 Encounter for other specified aftercare: Secondary | ICD-10-CM | POA: Diagnosis not present

## 2014-01-05 DIAGNOSIS — M25571 Pain in right ankle and joints of right foot: Secondary | ICD-10-CM

## 2014-01-05 DIAGNOSIS — M25671 Stiffness of right ankle, not elsewhere classified: Secondary | ICD-10-CM

## 2014-01-05 NOTE — Addendum Note (Signed)
Encounter addended by: Leeroy Cha, PT on: 01/05/2014  9:09 AM<BR>     Documentation filed: Clinical Notes
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
## 2014-01-09 ENCOUNTER — Ambulatory Visit (HOSPITAL_COMMUNITY)
Admission: RE | Admit: 2014-01-09 | Discharge: 2014-01-09 | Disposition: A | Discharge status: Home / Self Care | Payer: 59 | Source: Ambulatory Visit | Attending: Internal Medicine | Admitting: Internal Medicine

## 2014-01-09 DIAGNOSIS — M25571 Pain in right ankle and joints of right foot: Secondary | ICD-10-CM | POA: Insufficient documentation

## 2014-01-09 DIAGNOSIS — Z5189 Encounter for other specified aftercare: Secondary | ICD-10-CM | POA: Insufficient documentation

## 2014-01-09 DIAGNOSIS — M25671 Stiffness of right ankle, not elsewhere classified: Secondary | ICD-10-CM

## 2014-01-09 NOTE — Therapy (Signed)
[facility] [address] [place], [place], [address] Phone: [phone]   Fax:  [phone]  Physical Therapy Treatment  Patient Details  Name: Becky [name] MRN: [id number] Date of Birth: 1960/01/08  Encounter Date: 01/09/2014    Past Medical History  Diagnosis Date  . Hypertension   . Thyroid disease   . Back pain   . Migraines   . Vaginal irritation 05/23/2013  . Hematuria 05/23/2013  . BV (bacterial vaginosis) 05/23/2013  . Hyperlipidemia   . Pelvic pain in female 11/02/2013  . Elevated cholesterol 11/02/2013    Past Surgical History  Procedure Laterality Date  . Ectopic pregnancy surgery    . Ileocolonoscopy  12/19/2009    WOE:[id number] polyps/mild left-side diverticulosis/hemorrhoids  . Esophagogastroduodenoscopy  12/19/2009    BCW:UGQBVQ stricture s/p dilation/mild gastritis    There were no vitals taken for this visit.  Visit Diagnosis:  Stiffness of ankle joint, right  Pain in joint, ankle and foot, right      Subjective Assessment - 01/09/14 1735    Symptoms Pt comes in today with antalgic gait, just getting off an 8 hour shift.  States her foot is hurting bad today and beginning to hurt at rest as well.   Currently in Pain? Yes   Pain Score 5    Pain Location Foot   Pain Orientation Right;Medial            [facility] Adult PT Treatment/Exercise - 01/09/14 1741    Modalities   Modalities Iontophoresis   Iontophoresis   Type of Iontophoresis Dexamethasone   Location Rt medial plantar heel   Dose 60 mA/min current 2.0   Time #1: 30 minutes             PT Short Term Goals - 12/15/13 1129    PT SHORT TERM GOAL #1   Title Pt pain to be no greater than a 5 when getting up from being nonweight bearing for longer than 30 minutes.   Status Not Met   PT SHORT TERM GOAL #2   Title Pt to be able to stand for 2 hours without increased pain     Status Achieved   PT SHORT TERM GOAL #3   Title Pt to be  able to walk for 2 hours without increased pain   Status Achieved           PT Long Term Goals - 12/15/13 1129    PT LONG TERM GOAL #1   Title Pt pain to be no greater than a 2/10 when getting up     Status Not Met   PT LONG TERM GOAL #2   Title Pt to be able to stand for four hours without pain     Status Achieved   PT LONG TERM GOAL #3   Title Pt to be able to walkd for four hours without increased pain     Status Achieved               Plan - 01/09/14 1741    Clinical Impression Statement Written order still not received, contacted Dr. Renelda Loma office and received verbal order from his nurse to begin iontophoresis with dexamethasone.  Pt already given written information sheet on ionto last visit.  Unable to complete exercises today, only completed ionto.  Current must be lower to allow penetration through thicker skin, thus taking longer time.    PT Next Visit Plan Continue iontophoresis.  Focus on increasing ROM for dorsiflexion  and strength for dorsiflexion        Problem List Patient Active Problem List   Diagnosis Date Noted  . Pelvic pain in female 11/02/2013  . Elevated cholesterol 11/02/2013  . Low back pain 10/20/2013  . Stiffness of ankle joint 10/20/2013  . Pain in joint, ankle and foot 10/20/2013  . Vaginal irritation 05/23/2013  . Hematuria 05/23/2013  . BV (bacterial vaginosis) 05/23/2013  . HEMATOCHEZIA 12/05/2009  . DYSPHAGIA 12/05/2009  . CHEST WALL PAIN, ANTERIOR 06/23/2007  . LEG PAIN 05/18/2007  . VAGINAL PRURITUS 04/16/2007  . GOITER 03/10/2007  . HYPOTHYROIDISM 03/10/2007  . DIABETES MELLITUS, TYPE II 03/10/2007  . HYPERLIPIDEMIA 03/10/2007  . ANEMIA-IRON DEFICIENCY 03/10/2007  . HYPERTENSION 03/10/2007  . RHINITIS, CHRONIC 03/10/2007  . ASTHMA, CHILDHOOD 03/10/2007  . GERD 03/10/2007  . PEPTIC ULCER DISEASE 03/10/2007  . MENOPAUSAL SYNDROME 03/10/2007    Teena Irani, PTA/CLT [phone] 01/09/2014, 5:47 PM  [facility] [address] [place], [place], [address] Phone: [phone]   Fax:  [phone]

## 2014-01-11 ENCOUNTER — Encounter: Payer: Self-pay | Admitting: Gastroenterology

## 2014-01-11 ENCOUNTER — Ambulatory Visit (HOSPITAL_COMMUNITY)
Admission: RE | Admit: 2014-01-11 | Discharge: 2014-01-11 | Disposition: A | Discharge status: Home / Self Care | Payer: 59 | Source: Ambulatory Visit | Attending: Internal Medicine | Admitting: Internal Medicine

## 2014-01-11 DIAGNOSIS — Z5189 Encounter for other specified aftercare: Secondary | ICD-10-CM | POA: Diagnosis not present

## 2014-01-11 DIAGNOSIS — M25571 Pain in right ankle and joints of right foot: Secondary | ICD-10-CM

## 2014-01-11 DIAGNOSIS — M25671 Stiffness of right ankle, not elsewhere classified: Secondary | ICD-10-CM

## 2014-01-11 NOTE — Therapy (Signed)
[facility] [place], [place], [address] Phone: [phone]   Fax:  [phone]  Physical Therapy Treatment  Patient Details  Name: Becky [name] MRN: [id number] Date of Birth: [birth year] Referring Provider:  Redmond School, MD  Encounter Date: 01/11/2014      PT End of Session - 01/11/14 1734    Visit Number 13   Number of Visits 16   Date for PT Re-Evaluation 02/04/14   PT Start Time 1700   PT Stop Time 6962  increased time getting phoresor to work   PT Time Calculation (min) 55 min   Activity Tolerance Patient tolerated treatment well   Behavior During Therapy [facility] for tasks assessed/performed      Past Medical History  Diagnosis Date  . Hypertension   . Thyroid disease   . Back pain   . Migraines   . Vaginal irritation 05/23/2013  . Hematuria 05/23/2013  . BV (bacterial vaginosis) 05/23/2013  . Hyperlipidemia   . Pelvic pain in female 11/02/2013  . Elevated cholesterol 11/02/2013    Past Surgical History  Procedure Laterality Date  . Ectopic pregnancy surgery    . Ileocolonoscopy  12/19/2009    XBM:[id number] polyps/mild left-side diverticulosis/hemorrhoids  . Esophagogastroduodenoscopy  12/19/2009    OZD:GUYQIH stricture s/p dilation/mild gastritis    There were no vitals taken for this visit.  Visit Diagnosis:  Stiffness of ankle joint, right  Pain in joint, ankle and foot, right      Subjective Assessment - 01/11/14 1723    Symptoms Pt states her foot is better today.  Pain has decreased to 3/10 today (was 5/10 last visit).   Currently in Pain? Yes   Pain Score 3    Pain Location Foot   Pain Orientation Right;Medial             [facility] Adult PT Treatment/Exercise - 01/11/14 1725    Modalities   Modalities Iontophoresis   Iontophoresis   Type of Iontophoresis Dexamethasone   Location Rt medial plantar heel   Dose 60 mA/min current 2.0   Time #2: 24 minutes   Ankle Exercises:  Standing   Toe Raise 15 reps   Heel Walk (Round Trip) 2 RT   Ankle Exercises: Supine   T-Band dorsiflexion red 10 reps X 2 sets            PT Short Term Goals - 12/15/13 1129    PT SHORT TERM GOAL #1   Title Pt pain to be no greater than a 5 when getting up from being nonweight bearing for longer than 30 minutes.   Status Not Met   PT SHORT TERM GOAL #2   Title Pt to be able to stand for 2 hours without increased pain     Status Achieved   PT SHORT TERM GOAL #3   Title Pt to be able to walk for 2 hours without increased pain   Status Achieved           PT Long Term Goals - 12/15/13 1129    PT LONG TERM GOAL #1   Title Pt pain to be no greater than a 2/10 when getting up     Status Not Met   PT LONG TERM GOAL #2   Title Pt to be able to stand for four hours without pain     Status Achieved   PT LONG TERM GOAL #3   Title Pt to be able to walkd for four hours  without increased pain     Status Achieved               Plan - 01/11/14 1731    Clinical Impression Statement Pt with slight improvment following first ionto treatment.  resumed therex today focusing on dorsiflexion strengthening.  continued difficulty getting phoresor to function correctly due to thicker skin on plantar aspect of foot.  Noted improvment in gait today without antalgia.     PT Next Visit Plan Continue iontophoresis.  Focus on increasing ROM for dorsiflexion and strength for dorsiflexion        Problem List Patient Active Problem List   Diagnosis Date Noted  . Pelvic pain in female 11/02/2013  . Elevated cholesterol 11/02/2013  . Low back pain 10/20/2013  . Stiffness of ankle joint 10/20/2013  . Pain in joint, ankle and foot 10/20/2013  . Vaginal irritation 05/23/2013  . Hematuria 05/23/2013  . BV (bacterial vaginosis) 05/23/2013  . HEMATOCHEZIA 12/05/2009  . DYSPHAGIA 12/05/2009  . CHEST WALL PAIN, ANTERIOR 06/23/2007  . LEG PAIN 05/18/2007  . VAGINAL PRURITUS 04/16/2007  .  GOITER 03/10/2007  . HYPOTHYROIDISM 03/10/2007  . DIABETES MELLITUS, TYPE II 03/10/2007  . HYPERLIPIDEMIA 03/10/2007  . ANEMIA-IRON DEFICIENCY 03/10/2007  . HYPERTENSION 03/10/2007  . RHINITIS, CHRONIC 03/10/2007  . ASTHMA, CHILDHOOD 03/10/2007  . GERD 03/10/2007  . PEPTIC ULCER DISEASE 03/10/2007  . MENOPAUSAL SYNDROME 03/10/2007    Teena Irani, PTA/CLT [phone] 01/11/2014, 5:39 PM  [facility] [address] [place], [place], [address] Phone: [phone]   Fax:  [phone]

## 2014-01-17 ENCOUNTER — Ambulatory Visit (HOSPITAL_COMMUNITY)
Admission: RE | Admit: 2014-01-17 | Discharge: 2014-01-17 | Disposition: A | Discharge status: Home / Self Care | Payer: 59 | Source: Ambulatory Visit | Attending: Internal Medicine | Admitting: Internal Medicine

## 2014-01-17 DIAGNOSIS — Z5189 Encounter for other specified aftercare: Secondary | ICD-10-CM | POA: Diagnosis not present

## 2014-01-17 DIAGNOSIS — M25571 Pain in right ankle and joints of right foot: Secondary | ICD-10-CM

## 2014-01-17 DIAGNOSIS — M25671 Stiffness of right ankle, not elsewhere classified: Secondary | ICD-10-CM

## 2014-01-17 NOTE — Therapy (Signed)
[facility] [place], [place], [address] Phone: [phone]   Fax:  [phone]  Physical Therapy Treatment  Patient Details  Name: Becky [name] MRN: [id number] Date of Birth: 04-23-1960 Referring Provider:  Redmond School, MD  Encounter Date: 01/17/2014      PT End of Session - 01/17/14 0835    Visit Number 13   Number of Visits 16   Date for PT Re-Evaluation 02/04/14   PT Start Time 0804   PT Stop Time 0850   PT Time Calculation (min) 46 min   Activity Tolerance Patient tolerated treatment well   Behavior During Therapy [facility] for tasks assessed/performed      Past Medical History  Diagnosis Date  . Hypertension   . Thyroid disease   . Back pain   . Migraines   . Vaginal irritation 05/23/2013  . Hematuria 05/23/2013  . BV (bacterial vaginosis) 05/23/2013  . Hyperlipidemia   . Pelvic pain in female 11/02/2013  . Elevated cholesterol 11/02/2013    Past Surgical History  Procedure Laterality Date  . Ectopic pregnancy surgery    . Ileocolonoscopy  12/19/2009    WIO:[id number] polyps/mild left-side diverticulosis/hemorrhoids  . Esophagogastroduodenoscopy  12/19/2009    STM:HDQQIW stricture s/p dilation/mild gastritis    There were no vitals taken for this visit.  Visit Diagnosis:  Stiffness of ankle joint, right  Pain in joint, ankle and foot, right      Subjective Assessment - 01/17/14 0828    Symptoms Pt states her foot is getting better overall.  Currently without pain unless deeply palpated in medial arch region and around outside of heel.  Pt also reports some discomfort on her Rt dorsal foot, however attributes this to the way she's been walking.  Pt states her pain is around 2/10 when she first gets up and subsides after moving around a while.  Pt continues to wear heel pad in Rt shoe.   Currently in Pain? No/denies             [facility] Adult PT Treatment/Exercise - 01/17/14 0811    Modalities   Modalities Iontophoresis   Iontophoresis   Type of Iontophoresis Dexamethasone   Location Rt medial plantar heel   Dose 60 mA/min current 2.0   Time #3: 24 minutes   Ankle Exercises: Standing   Toe Raise 20 reps   Heel Walk (Round Trip) 2 RT   Ankle Exercises: Stretches   Plantar Fascia Stretch 3 reps;30 seconds   Soleus Stretch 3 reps;30 seconds   Gastroc Stretch 3 reps;30 seconds   Gastroc Stretch Limitations slant board    Slant Board Stretch 3 reps;Limitations;30 seconds            PT Education - 01/17/14 0840    Education provided Yes   Education Details educated on importance of heel-toe gait to decrease pain in lateral foot and dorsal heel.  Pt able to demonstrate correctly.   Person(s) Educated Patient   Methods Explanation;Demonstration;Verbal cues   Comprehension Returned demonstration;Tactile cues required          PT Short Term Goals - 12/15/13 1129    PT SHORT TERM GOAL #1   Title Pt pain to be no greater than a 5 when getting up from being nonweight bearing for longer than 30 minutes.   Status Not Met   PT SHORT TERM GOAL #2   Title Pt to be able to stand for 2 hours without increased pain  Status Achieved   PT SHORT TERM GOAL #3   Title Pt to be able to walk for 2 hours without increased pain   Status Achieved           PT Long Term Goals - 12/15/13 1129    PT LONG TERM GOAL #1   Title Pt pain to be no greater than a 2/10 when getting up     Status Not Met   PT LONG TERM GOAL #2   Title Pt to be able to stand for four hours without pain     Status Achieved   PT LONG TERM GOAL #3   Title Pt to be able to walkd for four hours without increased pain     Status Achieved               Plan - 01/17/14 0837    Clinical Impression Statement Overall improvement in Rt plantar fascia pain. Pt continues to work full time, however wearing heel pad in Rt shoe. Resumed stretches and strengthening for dorsiflexion. No antalgia  noted this morning or pain   PT Next Visit Plan Continue iontophoresis.  Focus on increasing ROM for dorsiflexion and strength for dorsiflexion.  continue X 3 more visits then reassess.        Problem List Patient Active Problem List   Diagnosis Date Noted  . Pelvic pain in female 11/02/2013  . Elevated cholesterol 11/02/2013  . Low back pain 10/20/2013  . Stiffness of ankle joint 10/20/2013  . Pain in joint, ankle and foot 10/20/2013  . Vaginal irritation 05/23/2013  . Hematuria 05/23/2013  . BV (bacterial vaginosis) 05/23/2013  . HEMATOCHEZIA 12/05/2009  . DYSPHAGIA 12/05/2009  . CHEST WALL PAIN, ANTERIOR 06/23/2007  . LEG PAIN 05/18/2007  . VAGINAL PRURITUS 04/16/2007  . GOITER 03/10/2007  . HYPOTHYROIDISM 03/10/2007  . DIABETES MELLITUS, TYPE II 03/10/2007  . HYPERLIPIDEMIA 03/10/2007  . ANEMIA-IRON DEFICIENCY 03/10/2007  . HYPERTENSION 03/10/2007  . RHINITIS, CHRONIC 03/10/2007  . ASTHMA, CHILDHOOD 03/10/2007  . GERD 03/10/2007  . PEPTIC ULCER DISEASE 03/10/2007  . MENOPAUSAL SYNDROME 03/10/2007    Teena Irani, PTA/CLT [phone] 01/17/2014, 8:41 AM  [facility] [address] [place], [place], [address] Phone: [phone]   Fax:  [phone]

## 2014-01-19 ENCOUNTER — Ambulatory Visit (HOSPITAL_COMMUNITY)
Admission: RE | Admit: 2014-01-19 | Discharge: 2014-01-19 | Disposition: A | Discharge status: Home / Self Care | Payer: 59 | Source: Ambulatory Visit | Attending: Orthopedic Surgery | Admitting: Orthopedic Surgery

## 2014-01-19 DIAGNOSIS — M25671 Stiffness of right ankle, not elsewhere classified: Secondary | ICD-10-CM

## 2014-01-19 DIAGNOSIS — Z5189 Encounter for other specified aftercare: Secondary | ICD-10-CM | POA: Diagnosis not present

## 2014-01-19 DIAGNOSIS — M25571 Pain in right ankle and joints of right foot: Secondary | ICD-10-CM

## 2014-01-19 NOTE — Therapy (Signed)
[facility] [place], [place], [address] Phone: [phone]   Fax:  [phone]  Physical Therapy Treatment  Patient Details  Name: Becky [name] MRN: [id number] Date of Birth: 25-Aug-1960 Referring Provider:  Wylene Simmer, MD  Encounter Date: 01/19/2014      PT End of Session - 01/19/14 0826    Visit Number 14   Number of Visits 16   Date for PT Re-Evaluation 02/04/14   PT Start Time 0805   PT Stop Time 0850   PT Time Calculation (min) 45 min   Activity Tolerance Patient tolerated treatment well   Behavior During Therapy [facility] for tasks assessed/performed      Past Medical History  Diagnosis Date  . Hypertension   . Thyroid disease   . Back pain   . Migraines   . Vaginal irritation 05/23/2013  . Hematuria 05/23/2013  . BV (bacterial vaginosis) 05/23/2013  . Hyperlipidemia   . Pelvic pain in female 11/02/2013  . Elevated cholesterol 11/02/2013    Past Surgical History  Procedure Laterality Date  . Ectopic pregnancy surgery    . Ileocolonoscopy  12/19/2009    YYQ:[id number] polyps/mild left-side diverticulosis/hemorrhoids  . Esophagogastroduodenoscopy  12/19/2009    BVQ:[id number] stricture s/p dilation/mild gastritis    There were no vitals taken for this visit.  Visit Diagnosis:  Stiffness of ankle joint, right  Pain in joint, ankle and foot, right      Subjective Assessment - 01/19/14 0808    Symptoms Pt reports her foot continues to feel better.  States she's been concious of rolling her foot heel-toe with ambulation.  Currently with 3/10 pain.   Currently in Pain? Yes   Pain Score 3    Pain Location Foot   Pain Orientation Right;Medial                    [facility] Adult PT Treatment/Exercise - 01/19/14 0809    Modalities   Modalities Iontophoresis   Iontophoresis   Type of Iontophoresis Dexamethasone   Location Rt medial plantar heel   Dose 60 mA/min current 2.0   Time #3: 24  minutes   Manual Therapy   Manual Therapy --   Ankle Exercises: Standing   Toe Raise 20 reps   Heel Walk (Round Trip) 2 RT   Ankle Exercises: Stretches   Plantar Fascia Stretch 3 reps;30 seconds   Soleus Stretch 3 reps;30 seconds   Gastroc Stretch 3 reps;30 seconds   Gastroc Stretch Limitations slant board    Slant Board Stretch 3 reps;Limitations;30 seconds                  PT Short Term Goals - 12/15/13 1129    PT SHORT TERM GOAL #1   Title Pt pain to be no greater than a 5 when getting up from being nonweight bearing for longer than 30 minutes.   Status Not Met   PT SHORT TERM GOAL #2   Title Pt to be able to stand for 2 hours without increased pain     Status Achieved   PT SHORT TERM GOAL #3   Title Pt to be able to walk for 2 hours without increased pain   Status Achieved           PT Long Term Goals - 12/15/13 1129    PT LONG TERM GOAL #1   Title Pt pain to be no greater than a 2/10 when getting up  Status Not Met   PT LONG TERM GOAL #2   Title Pt to be able to stand for four hours without pain     Status Achieved   PT LONG TERM GOAL #3   Title Pt to be able to walkd for four hours without increased pain     Status Achieved               Plan - 01/19/14 0826    Clinical Impression Statement Rt foot continues to improve overall.  Continued with strengthening for dorsiflexion and plantar fascia/gastroc/soleus stretches.  Pt educated on importance of completing these stretchess throughout the day and continued concentration of heel-toe gait to solidify normal mechanics of the foot.   Pt with 2 visits remaining.   PT Next Visit Plan Continue iontophoresis.  Focus on increasing ROM for dorsiflexion and strength for dorsiflexion.  continue X 2 more visits then reassess.        Problem List Patient Active Problem List   Diagnosis Date Noted  . Pelvic pain in female 11/02/2013  . Elevated cholesterol 11/02/2013  . Low back pain 10/20/2013  .  Stiffness of ankle joint 10/20/2013  . Pain in joint, ankle and foot 10/20/2013  . Vaginal irritation 05/23/2013  . Hematuria 05/23/2013  . BV (bacterial vaginosis) 05/23/2013  . HEMATOCHEZIA 12/05/2009  . DYSPHAGIA 12/05/2009  . CHEST WALL PAIN, ANTERIOR 06/23/2007  . LEG PAIN 05/18/2007  . VAGINAL PRURITUS 04/16/2007  . GOITER 03/10/2007  . HYPOTHYROIDISM 03/10/2007  . DIABETES MELLITUS, TYPE II 03/10/2007  . HYPERLIPIDEMIA 03/10/2007  . ANEMIA-IRON DEFICIENCY 03/10/2007  . HYPERTENSION 03/10/2007  . RHINITIS, CHRONIC 03/10/2007  . ASTHMA, CHILDHOOD 03/10/2007  . GERD 03/10/2007  . PEPTIC ULCER DISEASE 03/10/2007  . MENOPAUSAL SYNDROME 03/10/2007    Teena Irani, PTA/CLT [phone] 01/19/2014, 8:31 AM  [facility] [address] [place], [place], [address] Phone: [phone]   Fax:  [phone]

## 2014-02-07 ENCOUNTER — Ambulatory Visit (HOSPITAL_COMMUNITY): Payer: 59 | Attending: Orthopedic Surgery | Admitting: Physical Therapy

## 2014-02-07 DIAGNOSIS — M25671 Stiffness of right ankle, not elsewhere classified: Secondary | ICD-10-CM | POA: Insufficient documentation

## 2014-02-07 DIAGNOSIS — M25571 Pain in right ankle and joints of right foot: Secondary | ICD-10-CM | POA: Insufficient documentation

## 2014-02-07 DIAGNOSIS — Z5189 Encounter for other specified aftercare: Secondary | ICD-10-CM | POA: Insufficient documentation

## 2014-02-08 ENCOUNTER — Ambulatory Visit: Payer: Self-pay | Admitting: Gastroenterology

## 2014-02-09 ENCOUNTER — Ambulatory Visit (HOSPITAL_COMMUNITY): Payer: 59 | Admitting: Physical Therapy

## 2014-03-02 ENCOUNTER — Encounter: Payer: Self-pay | Admitting: Gastroenterology

## 2014-03-02 ENCOUNTER — Ambulatory Visit: Payer: 59 | Admitting: Gastroenterology

## 2014-03-07 NOTE — Therapy (Signed)
[place], [place], [address] Phone: [phone]   Fax:  [phone]  Patient Details  Name: Becky [name] MRN: [id number] Date of Birth: [birth year] Referring Provider:  Wylene Simmer, MD  Encounter Date: 01/19/2014 PHYSICAL THERAPY DISCHARGE SUMMARY  Visits from Start of Care:14  Current functional level related to goals / functional outcomes: Pt is able to stand and walk longer   Remaining deficits: pain   Education / Equipment: HEP  Pt goals was partially achieved.  Pt was discharged due to not returning for further visits.     RUSSELL,CINDY 03/07/2014, 1:34 PM  [facility] [address] [place], [place], [address] Phone: [phone]   Fax:  [phone]

## 2014-03-07 NOTE — Addendum Note (Signed)
Encounter addended by: Leeroy Cha, PT on: 03/07/2014  1:36 PM<BR>     Documentation filed: Episodes, Clinical Notes

## 2014-03-24 ENCOUNTER — Ambulatory Visit (INDEPENDENT_AMBULATORY_CARE_PROVIDER_SITE_OTHER): Payer: 59 | Admitting: Adult Health

## 2014-03-24 ENCOUNTER — Encounter: Payer: Self-pay | Admitting: Adult Health

## 2014-03-24 VITALS — BP 180/80 | HR 71 | Ht 67.0 in | Wt 241.5 lb

## 2014-03-24 DIAGNOSIS — B9689 Other specified bacterial agents as the cause of diseases classified elsewhere: Secondary | ICD-10-CM

## 2014-03-24 DIAGNOSIS — N760 Acute vaginitis: Secondary | ICD-10-CM

## 2014-03-24 DIAGNOSIS — N898 Other specified noninflammatory disorders of vagina: Secondary | ICD-10-CM | POA: Diagnosis not present

## 2014-03-24 DIAGNOSIS — A499 Bacterial infection, unspecified: Secondary | ICD-10-CM

## 2014-03-24 DIAGNOSIS — L298 Other pruritus: Secondary | ICD-10-CM | POA: Diagnosis not present

## 2014-03-24 HISTORY — DX: Other specified noninflammatory disorders of vagina: N89.8

## 2014-03-24 LAB — POCT WET PREP (WET MOUNT): WBC, Wet Prep HPF POC: POSITIVE

## 2014-03-24 MED ORDER — METRONIDAZOLE 500 MG PO TABS: 500.0000 mg | ORAL_TABLET | Freq: Two times a day (BID) | ORAL | Status: DC

## 2014-03-24 NOTE — Progress Notes (Signed)
Subjective:     Patient ID: Becky [name], female   DOB: 24-Aug-1960, 54 y.o.   MRN: [id number]  HPI Becky [name] is a 54 year old black female in complaining of vaginal itch for 1 week,maybe slight odor and discharge in am.  Review of Systems +vaginal itch and discharge, slight odor Reviewed past medical,surgical, social and family history. Reviewed medications and allergies.     Objective:   Physical Exam BP 180/80 mmHg  Pulse 71  Ht 5\' 7"  (1.702 m)  Wt 241 lb 8 oz (109.544 kg)  BMI 37.82 kg/m2   Skin warm and dry.Pelvic: external genitalia is normal in appearance no lesions, vagina: greenish discharge with odor,urethra has no lesions or masses noted, cervix:smooth and bulbous, uterus: normal size, shape and contour, non tender, no masses felt, adnexa: no masses or tenderness noted. Bladder is non tender and no masses felt. Wet prep: + for clue cells and +WBCs. Husband is not circumcised, get him to wash before sex and do not ejaculate in side.  Assessment:     Vaginal itch Vaginal discharge BV    Plan:     Rx flagyl 500 mg 1 bid x 7 days,#14 with 1 refill, no alcohol, review handout on BV   Follow up prn

## 2014-03-24 NOTE — Patient Instructions (Signed)
Bacterial Vaginosis Bacterial vaginosis is a vaginal infection that occurs when the normal balance of bacteria in the vagina is disrupted. It results from an overgrowth of certain bacteria. This is the most common vaginal infection in women of childbearing age. Treatment is important to prevent complications, especially in pregnant women, as it can cause a premature delivery. CAUSES  Bacterial vaginosis is caused by an increase in harmful bacteria that are normally present in smaller amounts in the vagina. Several different kinds of bacteria can cause bacterial vaginosis. However, the reason that the condition develops is not fully understood. RISK FACTORS Certain activities or behaviors can put you at an increased risk of developing bacterial vaginosis, including:  Having a new sex partner or multiple sex partners.  Douching.  Using an intrauterine device (IUD) for contraception. Women do not get bacterial vaginosis from toilet seats, bedding, swimming pools, or contact with objects around them. SIGNS AND SYMPTOMS  Some women with bacterial vaginosis have no signs or symptoms. Common symptoms include:  Grey vaginal discharge.  A fishlike odor with discharge, especially after sexual intercourse.  Itching or burning of the vagina and vulva.  Burning or pain with urination. DIAGNOSIS  Your health care provider will take a medical history and examine the vagina for signs of bacterial vaginosis. A sample of vaginal fluid may be taken. Your health care provider will look at this sample under a microscope to check for bacteria and abnormal cells. A vaginal pH test may also be done.  TREATMENT  Bacterial vaginosis may be treated with antibiotic medicines. These may be given in the form of a pill or a vaginal cream. A second round of antibiotics may be prescribed if the condition comes back after treatment.  HOME CARE INSTRUCTIONS   Only take over-the-counter or prescription medicines as  directed by your health care provider.  If antibiotic medicine was prescribed, take it as directed. Make sure you finish it even if you start to feel better.  Do not have sex until treatment is completed.  Tell all sexual partners that you have a vaginal infection. They should see their health care provider and be treated if they have problems, such as a mild rash or itching.  Practice safe sex by using condoms and only having one sex partner. SEEK MEDICAL CARE IF:   Your symptoms are not improving after 3 days of treatment.  You have increased discharge or pain.  You have a fever. MAKE SURE YOU:   Understand these instructions.  Will watch your condition.  Will get help right away if you are not doing well or get worse. FOR MORE INFORMATION  Centers for Disease Control and Prevention, Division of STD Prevention: AppraiserFraud.fi American Sexual Health Association (ASHA): www.ashastd.org  Document Released: 12/23/2004 Document Revised: 10/13/2012 Document Reviewed: 08/04/2012 [facility] Patient Information 2015 [place], [place]. This information is not intended to replace advice given to you by your health care provider. Make sure you discuss any questions you have with your health care provider. Take flagyl No alcohol

## 2014-06-13 ENCOUNTER — Emergency Department (HOSPITAL_COMMUNITY)
Admission: EM | Admit: 2014-06-13 | Discharge: 2014-06-14 | Disposition: A | Discharge status: Home / Self Care | Payer: 59 | Attending: Emergency Medicine | Admitting: Emergency Medicine

## 2014-06-13 ENCOUNTER — Encounter (HOSPITAL_COMMUNITY): Payer: Self-pay | Admitting: *Deleted

## 2014-06-13 DIAGNOSIS — M159 Polyosteoarthritis, unspecified: Secondary | ICD-10-CM | POA: Insufficient documentation

## 2014-06-13 DIAGNOSIS — M8949 Other hypertrophic osteoarthropathy, multiple sites: Secondary | ICD-10-CM

## 2014-06-13 DIAGNOSIS — E079 Disorder of thyroid, unspecified: Secondary | ICD-10-CM | POA: Diagnosis not present

## 2014-06-13 DIAGNOSIS — Z87891 Personal history of nicotine dependence: Secondary | ICD-10-CM | POA: Diagnosis not present

## 2014-06-13 DIAGNOSIS — Z792 Long term (current) use of antibiotics: Secondary | ICD-10-CM | POA: Diagnosis not present

## 2014-06-13 DIAGNOSIS — I10 Essential (primary) hypertension: Secondary | ICD-10-CM | POA: Insufficient documentation

## 2014-06-13 DIAGNOSIS — Z8742 Personal history of other diseases of the female genital tract: Secondary | ICD-10-CM | POA: Insufficient documentation

## 2014-06-13 DIAGNOSIS — E785 Hyperlipidemia, unspecified: Secondary | ICD-10-CM | POA: Insufficient documentation

## 2014-06-13 DIAGNOSIS — M150 Primary generalized (osteo)arthritis: Secondary | ICD-10-CM

## 2014-06-13 DIAGNOSIS — Z872 Personal history of diseases of the skin and subcutaneous tissue: Secondary | ICD-10-CM | POA: Diagnosis not present

## 2014-06-13 DIAGNOSIS — M25562 Pain in left knee: Secondary | ICD-10-CM | POA: Diagnosis present

## 2014-06-13 DIAGNOSIS — Z79899 Other long term (current) drug therapy: Secondary | ICD-10-CM | POA: Diagnosis not present

## 2014-06-13 LAB — BASIC METABOLIC PANEL
Anion gap: 8 (ref 5–15)
BUN: 13 mg/dL (ref 6–20)
CO2: 27 mmol/L (ref 22–32)
Calcium: 9.3 mg/dL (ref 8.9–10.3)
Chloride: 102 mmol/L (ref 101–111)
Creatinine, Ser: 0.94 mg/dL (ref 0.44–1.00)
GFR calc Af Amer: >60 mL/min (ref >60)
GFR calc non Af Amer: >60 mL/min (ref >60)
Glucose, Bld: 108 mg/dL — ABNORMAL HIGH (ref 65–99)
Potassium: 3.2 mmol/L — ABNORMAL LOW (ref 3.5–5.1)
Sodium: 137 mmol/L (ref 135–145)

## 2014-06-13 LAB — CBC
HCT: 40.8 % (ref 36.0–46.0)
Hemoglobin: 13.5 g/dL (ref 12.0–15.0)
MCH: 30.3 pg (ref 26.0–34.0)
MCHC: 33.1 g/dL (ref 30.0–36.0)
MCV: 91.5 fL (ref 78.0–100.0)
Platelets: 320 10*3/uL (ref 150–400)
RBC: 4.46 MIL/uL (ref 3.87–5.11)
RDW: 12.8 % (ref 11.5–15.5)
WBC: 9.1 10*3/uL (ref 4.0–10.5)

## 2014-06-13 MED ORDER — DEXAMETHASONE SODIUM PHOSPHATE 4 MG/ML IJ SOLN
8.0000 mg | Freq: Once | INTRAMUSCULAR | Status: AC
Start: 2014-06-13 — End: 2014-06-14
  Administered 2014-06-14: 8 mg via INTRAMUSCULAR
  Filled 2014-06-13: qty 2

## 2014-06-13 MED ORDER — ONDANSETRON HCL 4 MG PO TABS
4.0000 mg | ORAL_TABLET | Freq: Once | ORAL | Status: AC
  Administered 2014-06-14: 4 mg via ORAL
  Filled 2014-06-13: qty 1

## 2014-06-13 MED ORDER — KETOROLAC TROMETHAMINE 60 MG/2ML IM SOLN
60.0000 mg | Freq: Once | INTRAMUSCULAR | Status: AC
  Administered 2014-06-14: 60 mg via INTRAMUSCULAR
  Filled 2014-06-13: qty 2

## 2014-06-13 MED ORDER — DICLOFENAC SODIUM 75 MG PO TBEC: 75.0000 mg | DELAYED_RELEASE_TABLET | Freq: Two times a day (BID) | ORAL | Status: DC

## 2014-06-13 MED ORDER — HYDROCODONE-ACETAMINOPHEN 5-325 MG PO TABS: 1.0000 | ORAL_TABLET | ORAL | Status: DC | PRN

## 2014-06-13 NOTE — ED Provider Notes (Signed)
CSN: [id number]     Arrival date & time 06/13/14  2130 History   First MD Initiated Contact with Patient 06/13/14 2256     Chief Complaint  Patient presents with  . Joint Pain     (Consider location/radiation/quality/duration/timing/severity/associated sxs/prior Treatment) HPI Comments: Pt reports pain om the left knee, right index finger and left hip.   Patient is a 54 y.o. female presenting with extremity pain. The history is provided by the patient.  Extremity Pain This is a recurrent problem. The current episode started in the past 7 days. The problem occurs intermittently. The problem has been gradually worsening. Associated symptoms include arthralgias, headaches, joint swelling and myalgias. Pertinent negatives include no fever, numbness, rash or sore throat. The symptoms are aggravated by standing and walking (movement of finger). She has tried NSAIDs for the symptoms. The treatment provided no relief.    Past Medical History  Diagnosis Date  . Hypertension   . Thyroid disease   . Back pain   . Migraines   . Vaginal irritation 05/23/2013  . Hematuria 05/23/2013  . BV (bacterial vaginosis) 05/23/2013  . Hyperlipidemia   . Pelvic pain in female 11/02/2013  . Elevated cholesterol 11/02/2013  . Plantar fasciitis of right foot   . Vaginal itching 03/24/2014  . Vaginal discharge 03/24/2014   Past Surgical History  Procedure Laterality Date  . Ectopic pregnancy surgery    . Ileocolonoscopy  12/19/2009    BHA:[id number] polyps/mild left-side diverticulosis/hemorrhoids  . Esophagogastroduodenoscopy  12/19/2009    ZHG:[id number] stricture s/p dilation/mild gastritis   Family History  Problem Relation Age of Onset  . Heart failure Mother   . Hypertension Mother   . Diabetes Mother   . Heart failure Father   . Hypertension Father   . Heart failure Brother   . Hypertension Sister   . Other Sister     blocked artery in neck; knee replacement  . Other Brother     triple bypass  surgery  . Hypertension Sister   . Diabetes Sister    History  Substance Use Topics  . Smoking status: Former Smoker    Types: Cigarettes  . Smokeless tobacco: Never Used  . Alcohol Use: No   OB History    Gravida Para Term Preterm AB TAB SAB Ectopic Multiple Living   3 3        3      Review of Systems  Constitutional: Negative for fever.  HENT: Negative for sore throat.   Musculoskeletal: Positive for myalgias, back pain, joint swelling and arthralgias.  Skin: Negative for rash.  Neurological: Positive for headaches. Negative for numbness.  All other systems reviewed and are negative.     Allergies  Review of patient's allergies indicates no known allergies.  Home Medications   Prior to Admission medications   Medication Sig Start Date End Date Taking? Authorizing Provider  ibuprofen (ADVIL,MOTRIN) 200 MG tablet Take 200 mg by mouth every 6 (six) hours as needed for mild pain or moderate pain.    Yes Historical Provider, MD  KLOR-CON M20 20 MEQ tablet Take 20 mEq by mouth Daily. 06/25/11  Yes Historical Provider, MD  levothyroxine (SYNTHROID, LEVOTHROID) 88 MCG tablet Take 88 mcg by mouth every morning.   Yes Historical Provider, MD  lidocaine (LIDODERM) 5 % Place 1 patch onto the skin daily as needed (for pain).  10/02/12  Yes Historical Provider, MD  lisinopril-hydrochlorothiazide (PRINZIDE,ZESTORETIC) 20-12.5 MG per tablet TAKE ONE TABLET BY MOUTH ONCE DAILY 10/26/13  Yes Florian Buff, MD  NEXIUM 40 MG capsule Take 40 mg by mouth every morning. 06/23/11  Yes Historical Provider, MD  NON FORMULARY Apply 1 application topically daily as needed (for pain relief). REAL TIME PAIN RELIEF CREAM   Yes Historical Provider, MD  simvastatin (ZOCOR) 20 MG tablet Take 1 tablet (20 mg total) by mouth daily. 11/07/13  Yes Estill Dooms, NP  traMADol (ULTRAM) 50 MG tablet Take 25-50 mg by mouth daily as needed for moderate pain or severe pain.   Yes Historical Provider, MD   fluticasone (FLONASE) 50 MCG/ACT nasal spray Place 2 sprays into the nose as needed for allergies or rhinitis.     Historical Provider, MD  metroNIDAZOLE (FLAGYL) 500 MG tablet Take 1 tablet (500 mg total) by mouth 2 (two) times daily. Patient not taking: Reported on 06/13/2014 03/24/14   Estill Dooms, NP  promethazine (PHENERGAN) 25 MG tablet Take 1 tablet (25 mg total) by mouth every 6 (six) hours as needed for nausea. 11/02/13 11/09/13  Estill Dooms, NP   BP 176/86 mmHg  Pulse 69  Temp(Src) 98.1 F (36.7 C) (Oral)  Resp 20  Ht 5\' 8"  (1.727 m)  Wt 253 lb (114.76 kg)  BMI 38.48 kg/m2  SpO2 100% Physical Exam  Constitutional: She is oriented to person, place, and time. She appears well-developed and well-nourished.  Non-toxic appearance.  HENT:  Head: Normocephalic.  Right Ear: Tympanic membrane and external ear normal.  Left Ear: Tympanic membrane and external ear normal.  Eyes: EOM and lids are normal. Pupils are equal, round, and reactive to light.  Neck: Normal range of motion. Neck supple. Carotid bruit is not present.  Cardiovascular: Normal rate, regular rhythm, normal heart sounds, intact distal pulses and normal pulses.   Pulmonary/Chest: Breath sounds normal. No respiratory distress.  Abdominal: Soft. Bowel sounds are normal. There is no tenderness. There is no guarding.  Musculoskeletal:       Left hip: She exhibits decreased range of motion and crepitus. She exhibits no deformity.       Right knee: She exhibits decreased range of motion. She exhibits no erythema. Tenderness found.       Left knee: She exhibits decreased range of motion. She exhibits no erythema. Tenderness found.       Right hand: She exhibits decreased range of motion and tenderness. She exhibits no deformity. Normal sensation noted.  Crepitus of the right and left knee.  Lymphadenopathy:       Head (right side): No submandibular adenopathy present.       Head (left side): No submandibular  adenopathy present.    She has no cervical adenopathy.  Neurological: She is alert and oriented to person, place, and time. She has normal strength. No cranial nerve deficit or sensory deficit.  Skin: Skin is warm and dry.  Psychiatric: She has a normal mood and affect. Her speech is normal.  Nursing note and vitals reviewed.   ED Course  Procedures (including critical care time) Labs Review Labs Reviewed  BASIC METABOLIC PANEL - Abnormal; Notable for the following:    Potassium 3.2 (*)    Glucose, Bld 108 (*)    All other components within normal limits  CBC    Imaging Review No results found.   EKG Interpretation None      MDM  Vital signs stable.  CBC and Bmet non-acute. K+ low at 3.2. 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 No signs of infected joints. Pt  to follow up with PCP for management. Rx for norco and diclofenac given.   Final diagnoses:  None    *I have reviewed nursing notes, vital signs, and all appropriate lab and imaging results for this patient.Lily Kocher, PA-C 06/16/14 1115  Rolland Porter, MD 06/19/14 [phone]

## 2014-06-13 NOTE — ED Notes (Signed)
Pt c/o pain in her joints and in her right pointer finger.

## 2014-06-14 MED ORDER — POTASSIUM CHLORIDE CRYS ER 20 MEQ PO TBCR
40.0000 meq | EXTENDED_RELEASE_TABLET | Freq: Once | ORAL | Status: AC
  Administered 2014-06-14: 40 meq via ORAL

## 2014-06-14 MED ORDER — POTASSIUM CHLORIDE CRYS ER 20 MEQ PO TBCR
EXTENDED_RELEASE_TABLET | ORAL | Status: AC
  Filled 2014-06-14: qty 2

## 2014-06-15 ENCOUNTER — Telehealth: Payer: Self-pay | Admitting: Adult Health

## 2014-06-15 MED ORDER — LEVOTHYROXINE SODIUM 88 MCG PO TABS
88.0000 ug | ORAL_TABLET | ORAL | Status: DC
Start: 2014-06-15 — End: 2015-07-03

## 2014-06-15 MED ORDER — ESOMEPRAZOLE MAGNESIUM 40 MG PO CPDR: 40.0000 mg | DELAYED_RELEASE_CAPSULE | ORAL | Status: DC

## 2014-06-15 NOTE — Telephone Encounter (Signed)
Refilled meds

## 2014-06-15 NOTE — Telephone Encounter (Signed)
Spoke with pt. Pt is requesting a refill on her levothyroxine and generic Nexium. She would like a 90 day supply on the levothyroxine. Thanks! [place]

## 2014-06-19 ENCOUNTER — Other Ambulatory Visit: Payer: Self-pay | Admitting: Adult Health

## 2014-06-19 MED ORDER — DEXLANSOPRAZOLE 30 MG PO CPDR: 30.0000 mg | DELAYED_RELEASE_CAPSULE | Freq: Every day | ORAL | Status: DC

## 2014-06-27 ENCOUNTER — Other Ambulatory Visit: Payer: Self-pay | Admitting: Adult Health

## 2014-06-27 MED ORDER — DEXLANSOPRAZOLE 30 MG PO CPDR: 30.0000 mg | DELAYED_RELEASE_CAPSULE | Freq: Every day | ORAL | Status: DC

## 2014-11-01 ENCOUNTER — Other Ambulatory Visit: Payer: Self-pay | Admitting: Obstetrics and Gynecology

## 2014-11-01 DIAGNOSIS — Z1231 Encounter for screening mammogram for malignant neoplasm of breast: Secondary | ICD-10-CM

## 2014-11-15 ENCOUNTER — Ambulatory Visit (HOSPITAL_COMMUNITY): Payer: 59

## 2014-11-15 ENCOUNTER — Other Ambulatory Visit: Payer: 59 | Admitting: Adult Health

## 2014-11-18 ENCOUNTER — Emergency Department (HOSPITAL_COMMUNITY): Payer: 59

## 2014-11-18 ENCOUNTER — Encounter (HOSPITAL_COMMUNITY): Payer: Self-pay | Admitting: *Deleted

## 2014-11-18 ENCOUNTER — Emergency Department (HOSPITAL_COMMUNITY)
Admission: EM | Admit: 2014-11-18 | Discharge: 2014-11-19 | Disposition: A | Discharge status: Home / Self Care | Payer: 59 | Attending: Emergency Medicine | Admitting: Emergency Medicine

## 2014-11-18 DIAGNOSIS — Z79899 Other long term (current) drug therapy: Secondary | ICD-10-CM | POA: Insufficient documentation

## 2014-11-18 DIAGNOSIS — Z87891 Personal history of nicotine dependence: Secondary | ICD-10-CM | POA: Diagnosis not present

## 2014-11-18 DIAGNOSIS — M546 Pain in thoracic spine: Secondary | ICD-10-CM | POA: Diagnosis not present

## 2014-11-18 DIAGNOSIS — R0982 Postnasal drip: Secondary | ICD-10-CM | POA: Diagnosis not present

## 2014-11-18 DIAGNOSIS — G43809 Other migraine, not intractable, without status migrainosus: Secondary | ICD-10-CM | POA: Insufficient documentation

## 2014-11-18 DIAGNOSIS — Z8619 Personal history of other infectious and parasitic diseases: Secondary | ICD-10-CM | POA: Diagnosis not present

## 2014-11-18 DIAGNOSIS — G43909 Migraine, unspecified, not intractable, without status migrainosus: Secondary | ICD-10-CM | POA: Diagnosis present

## 2014-11-18 DIAGNOSIS — M542 Cervicalgia: Secondary | ICD-10-CM | POA: Diagnosis not present

## 2014-11-18 DIAGNOSIS — I10 Essential (primary) hypertension: Secondary | ICD-10-CM | POA: Diagnosis not present

## 2014-11-18 DIAGNOSIS — Z8739 Personal history of other diseases of the musculoskeletal system and connective tissue: Secondary | ICD-10-CM | POA: Diagnosis not present

## 2014-11-18 DIAGNOSIS — Z8742 Personal history of other diseases of the female genital tract: Secondary | ICD-10-CM | POA: Insufficient documentation

## 2014-11-18 DIAGNOSIS — E785 Hyperlipidemia, unspecified: Secondary | ICD-10-CM | POA: Insufficient documentation

## 2014-11-18 DIAGNOSIS — E079 Disorder of thyroid, unspecified: Secondary | ICD-10-CM | POA: Insufficient documentation

## 2014-11-18 LAB — CBC WITH DIFFERENTIAL/PLATELET
Basophils Absolute: 0 10*3/uL (ref 0.0–0.1)
Basophils Relative: 0 %
Eosinophils Absolute: 0.1 10*3/uL (ref 0.0–0.7)
Eosinophils Relative: 1 %
HCT: 41.9 % (ref 36.0–46.0)
Hemoglobin: 14.2 g/dL (ref 12.0–15.0)
Lymphocytes Relative: 12 %
Lymphs Abs: 1 10*3/uL (ref 0.7–4.0)
MCH: 30.9 pg (ref 26.0–34.0)
MCHC: 33.9 g/dL (ref 30.0–36.0)
MCV: 91.1 fL (ref 78.0–100.0)
Monocytes Absolute: 0.9 10*3/uL (ref 0.1–1.0)
Monocytes Relative: 11 %
Neutro Abs: 6.2 10*3/uL (ref 1.7–7.7)
Neutrophils Relative %: 76 %
Platelets: 339 10*3/uL (ref 150–400)
RBC: 4.6 MIL/uL (ref 3.87–5.11)
RDW: 12.9 % (ref 11.5–15.5)
WBC: 8.1 10*3/uL (ref 4.0–10.5)

## 2014-11-18 LAB — BASIC METABOLIC PANEL
Anion gap: 7 (ref 5–15)
BUN: 13 mg/dL (ref 6–20)
CO2: 30 mmol/L (ref 22–32)
Calcium: 9.9 mg/dL (ref 8.9–10.3)
Chloride: 103 mmol/L (ref 101–111)
Creatinine, Ser: 0.93 mg/dL (ref 0.44–1.00)
GFR calc Af Amer: >60 mL/min (ref >60)
GFR calc non Af Amer: >60 mL/min (ref >60)
Glucose, Bld: 137 mg/dL — ABNORMAL HIGH (ref 65–99)
Potassium: 3.2 mmol/L — ABNORMAL LOW (ref 3.5–5.1)
Sodium: 140 mmol/L (ref 135–145)

## 2014-11-18 LAB — I-STAT TROPONIN, ED: Troponin i, poc: 0 ng/mL (ref 0.00–0.08)

## 2014-11-18 MED ORDER — GI COCKTAIL ~~LOC~~
30.0000 mL | Freq: Once | ORAL | Status: AC
  Administered 2014-11-18: 30 mL via ORAL
  Filled 2014-11-18: qty 30

## 2014-11-18 MED ORDER — SODIUM CHLORIDE 0.9 % IV BOLUS (SEPSIS)
1000.0000 mL | Freq: Once | INTRAVENOUS | Status: AC
  Administered 2014-11-18: 1000 mL via INTRAVENOUS

## 2014-11-18 MED ORDER — PROCHLORPERAZINE EDISYLATE 5 MG/ML IJ SOLN
10.0000 mg | Freq: Once | INTRAMUSCULAR | Status: AC
  Administered 2014-11-18: 10 mg via INTRAVENOUS
  Filled 2014-11-18: qty 2

## 2014-11-18 MED ORDER — KETOROLAC TROMETHAMINE 30 MG/ML IJ SOLN
30.0000 mg | Freq: Once | INTRAMUSCULAR | Status: AC
  Administered 2014-11-18: 30 mg via INTRAVENOUS
  Filled 2014-11-18: qty 1

## 2014-11-18 MED ORDER — DIPHENHYDRAMINE HCL 50 MG/ML IJ SOLN
50.0000 mg | Freq: Once | INTRAMUSCULAR | Status: AC
  Administered 2014-11-18: 50 mg via INTRAVENOUS
  Filled 2014-11-18: qty 1

## 2014-11-18 MED ORDER — POTASSIUM CHLORIDE CRYS ER 20 MEQ PO TBCR
40.0000 meq | EXTENDED_RELEASE_TABLET | Freq: Once | ORAL | Status: AC
  Administered 2014-11-18: 40 meq via ORAL
  Filled 2014-11-18: qty 2

## 2014-11-18 NOTE — ED Provider Notes (Signed)
CSN: [id number]     Arrival date & time 11/18/14  1904 History  By signing my name below, I, Hansel Feinstein, attest that this documentation has been prepared under the direction and in the presence of Forde Dandy, MD. Electronically Signed: Hansel Feinstein, ED Scribe. 11/18/2014. 8:39 PM.    Chief Complaint  Patient presents with  . Headache   The history is provided by the patient. No language interpreter was used.   HPI Comments: Leane Solache is a 54 y.o. female with h/o HTN, thyroid disease, migraines, HLD who presents to the Emergency Department complaining of moderate throbbing, aching frontal HA onset this morning and worsening throughout the day. She states nausea, lightheadedness. It was preceded by her aura, which she reports looks like sintillating lights in front of her eyes.  She notes that these symptoms are similar to her prior migraine HAs. Pt states that lack of sleep and not eating well tends to trigger her HAs. Pt also reports indigestion, medial upper back and neck pain, aching leg pain, postnasal drip as associated symptoms. Pt states that her upper back/neck pain and her HA caused her the most concern today. She states h/o similar neck/back pain and leg pain that is relieved with heat and Beng[place] and very musculosketal in nature. States that she was on her feet all day at work today which often flares up her pain.She denies photophobia, phonophobia, emesis, fever, congestion, rhinorrhea, focal weakness or numbness.  Past Medical History  Diagnosis Date  . Hypertension   . Thyroid disease   . Back pain   . Migraines   . Vaginal irritation 05/23/2013  . Hematuria 05/23/2013  . BV (bacterial vaginosis) 05/23/2013  . Hyperlipidemia   . Pelvic pain in female 11/02/2013  . Elevated cholesterol 11/02/2013  . Plantar fasciitis of right foot   . Vaginal itching 03/24/2014  . Vaginal discharge 03/24/2014   Past Surgical History  Procedure Laterality Date  . Ectopic pregnancy  surgery    . Ileocolonoscopy  12/19/2009    [id number] polyps/mild left-side diverticulosis/hemorrhoids  . Esophagogastroduodenoscopy  12/19/2009    [id number] stricture s/p dilation/mild gastritis   Family History  Problem Relation Age of Onset  . Heart failure Mother   . Hypertension Mother   . Diabetes Mother   . Heart failure Father   . Hypertension Father   . Heart failure Brother   . Hypertension Sister   . Other Sister     blocked artery in neck; knee replacement  . Other Brother     triple bypass surgery  . Hypertension Sister   . Diabetes Sister    Social History  Substance Use Topics  . Smoking status: Former Smoker    Types: Cigarettes  . Smokeless tobacco: Never Used  . Alcohol Use: No   OB History    Gravida Para Term Preterm AB TAB SAB Ectopic Multiple Living   3 3        3      Review of Systems 10/14 systems reviewed and are negative other than those stated in the HPI.    Allergies  Review of patient's allergies indicates no known allergies.  Home Medications   Prior to Admission medications   Medication Sig Start Date End Date Taking? Authorizing Provider  amLODipine (NORVASC) 10 MG tablet Take 10 mg by mouth daily. 09/21/14  Yes Historical Provider, MD  Dexlansoprazole 30 MG capsule Take 1 capsule (30 mg total) by mouth daily. 06/27/14  Yes Estill Dooms, NP  diclofenac (VOLTAREN) 75 MG EC tablet Take 75 mg by mouth 2 (two) times daily as needed for mild pain.  10/09/14  Yes Historical Provider, MD  fluticasone (FLONASE) 50 MCG/ACT nasal spray Place 2 sprays into the nose as needed for allergies or rhinitis.    Yes Historical Provider, MD  KLOR-CON M20 20 MEQ tablet Take 20 mEq by mouth Daily. 06/25/11  Yes Historical Provider, MD  levothyroxine (SYNTHROID, LEVOTHROID) 88 MCG tablet Take 1 tablet (88 mcg total) by mouth every morning. 06/15/14  Yes Estill Dooms, NP  lidocaine (LIDODERM) 5 % Place 1 patch onto the skin daily as needed (for  pain).  10/02/12  Yes Historical Provider, MD  losartan (COZAAR) 100 MG tablet Take 100 mg by mouth daily. 09/21/14  Yes Historical Provider, MD  simvastatin (ZOCOR) 20 MG tablet Take 1 tablet (20 mg total) by mouth daily. 11/07/13  Yes Estill Dooms, NP  HYDROcodone-acetaminophen (NORCO/VICODIN) 5-325 MG per tablet Take 1 tablet by mouth every 4 (four) hours as needed. Patient not taking: Reported on 11/18/2014 06/13/14   Lily Kocher, PA-C  lisinopril-hydrochlorothiazide (PRINZIDE,ZESTORETIC) 20-12.5 MG per tablet TAKE ONE TABLET BY MOUTH ONCE DAILY Patient not taking: Reported on 11/18/2014 10/26/13   Florian Buff, MD   BP 166/89 mmHg  Pulse 106  Temp(Src) 98.5 F (36.9 C) (Oral)  Resp 20  Ht 5\' 7"  (1.702 m)  Wt 248 lb (112.492 kg)  BMI 38.83 kg/m2  SpO2 98% Physical Exam Physical Exam  Nursing note and vitals reviewed. Constitutional: Well developed, well nourished, non-toxic, and in no acute distress Head: Normocephalic and atraumatic.  Mouth/Throat: Oropharynx is clear and moist.  Neck: Normal range of motion. Neck supple.  Cardiovascular: Normal rate and regular rhythm.   Pulmonary/Chest: Effort normal and breath sounds normal.  Abdominal: Soft. There is no tenderness. There is no rebound and no guarding.  Musculoskeletal: Normal range of motion. Reproducible TTP bilaterally of upper paraspinal thoracic spine. Neurological:  Alert, oriented to person, place, time, and situation. Memory grossly in tact. Fluent speech. No dysarthria or aphasia.  Cranial nerves: VF are full. Fundoscopic exam-unable to get good visualization of the discs. Pupils are symmetric, and reactive to light. EOMI without nystagmus. No gaze deviation. Facial muscles symmetric with activation. Sensation to light touch over face in tact bilaterally. Hearing grossly in tact. Palate elevates symmetrically. Head turn and shoulder shrug are intact. Tongue midline.  Reflexes defered.  Muscle bulk and tone normal.  No pronator drift. Moves all extremities symmetrically. Sensation to light touch is in tact throughout in bilateral upper and lower extremities. Coordination reveals no dysmetria with finger to nose. Gait is narrow-based and steady.   Skin: Skin is warm and dry.  Psychiatric: Cooperative   ED Course  Procedures (including critical care time) DIAGNOSTIC STUDIES: Oxygen Saturation is 98% on RA, normal by my interpretation.    COORDINATION OF CARE: 8:36 PM Discussed treatment plan with pt at bedside and pt agreed to plan.   Labs Review Labs Reviewed  BASIC METABOLIC PANEL - Abnormal; Notable for the following:    Potassium 3.2 (*)    Glucose, Bld 137 (*)    All other components within normal limits  CBC WITH DIFFERENTIAL/PLATELET  TROPONIN I  Randolm Idol, ED   Imaging Review Dg Chest 2 View  11/18/2014  CLINICAL DATA:  Acute onset of neck pain and tingling between the shoulders. Headache, chills and indigestion. Initial encounter. EXAM: CHEST  2 VIEW COMPARISON:  Chest radiograph  performed 05/28/2008, and CT of the chest performed 12/01/2011 FINDINGS: The lungs are well-aerated. Minimal bibasilar atelectasis is noted. There is no evidence of pleural effusion or pneumothorax. The heart is borderline normal in size. No acute osseous abnormalities are seen. IMPRESSION: Minimal bibasilar atelectasis noted.  Lungs otherwise clear. Electronically Signed   By: Garald Balding M.D.   On: 11/18/2014 21:54     I have personally reviewed and evaluated these images and lab results as part of my medical decision-making.   EKG Interpretation   Date/Time:  Saturday November 18 2014 19:09:38 EST Ventricular Rate:  105 PR Interval:  162 QRS Duration: 94 QT Interval:  380 QTC Calculation: 502 R Axis:   21 Text Interpretation:  Sinus tachycardia with Premature atrial complexes  Nonspecific T wave abnormality Abnormal ECG No significant change since  last tracing Confirmed by Evangaline Jou MD, Shaketha Jeon  [id number]) on 11/18/2014 8:56:57 PM      MDM   Final diagnoses:  Other migraine without status migrainosus, not intractable     54 year old female with history of migraine headaches, hypertension, and hyperlipidemia who presents with headache typical of her migraines. He is well-appearing and in no acute distress. Vital signs are non-concerning. She is neurologically intact. Evidence of reproducible back and neck pain with palpation of her bilateral paraspinal muscles. This is also consistent with prior musculoskeletal neck pain and back pain that she has had before in the past. Does not seem an atypical presentation of ACS she does have negative EKG and negative serial troponins. I feel like this is adequate rule out. Chest x-ray also without acute cardiopulmonary processes. Presentation not consistent with that of dissection or other serious or toxic etiology of her symptoms. She is given migraine cocktail with relief of her symptoms.  She is felt stable for discharge home. Strict return and follow-up instructions are reviewed. She expressed understanding of all discharge instructions and felt comfortable to plan of care.  I personally performed the services described in this documentation, which was scribed in my presence. The recorded information has been reviewed and is accurate.   Forde Dandy, MD 11/19/14 [phone]

## 2014-11-18 NOTE — ED Notes (Signed)
Pt states she is having neck pain and tingling between her shoulders. Pt also c/o a headache, chills, and indigestion.

## 2014-11-19 LAB — TROPONIN I: Troponin I: <0.03 ng/mL (ref <0.031)

## 2014-11-19 NOTE — ED Notes (Signed)
Discharge instructions given, pt demonstrated teach back and verbal understanding. No concerns voiced.  

## 2014-11-19 NOTE — Discharge Instructions (Signed)
Return for worsening symptoms, including worsening pain, confusion, difficulty breathing, vomiting and unable to keep down food/fluids, or any other symptoms concerning to you.  Migraine Headache A migraine headache is an intense, throbbing pain on one or both sides of your head. A migraine can last for 30 minutes to several hours. CAUSES  The exact cause of a migraine headache is not always known. However, a migraine may be caused when nerves in the brain become irritated and release chemicals that cause inflammation. This causes pain. Certain things may also trigger migraines, such as:  Alcohol.  Smoking.  Stress.  Menstruation.  Aged cheeses.  Foods or drinks that contain nitrates, glutamate, aspartame, or tyramine.  Lack of sleep.  Chocolate.  Caffeine.  Hunger.  Physical exertion.  Fatigue.  Medicines used to treat chest pain (nitroglycerine), birth control pills, estrogen, and some blood pressure medicines. SIGNS AND SYMPTOMS  Pain on one or both sides of your head.  Pulsating or throbbing pain.  Severe pain that prevents daily activities.  Pain that is aggravated by any physical activity.  Nausea, vomiting, or both.  Dizziness.  Pain with exposure to bright lights, loud noises, or activity.  General sensitivity to bright lights, loud noises, or smells. Before you get a migraine, you may get warning signs that a migraine is coming (aura). An aura may include:  Seeing flashing lights.  Seeing bright spots, halos, or zigzag lines.  Having tunnel vision or blurred vision.  Having feelings of numbness or tingling.  Having trouble talking.  Having muscle weakness. DIAGNOSIS  A migraine headache is often diagnosed based on:  Symptoms.  Physical exam.  A CT scan or MRI of your head. These imaging tests cannot diagnose migraines, but they can help rule out other causes of headaches. TREATMENT Medicines may be given for pain and nausea. Medicines  can also be given to help prevent recurrent migraines.  HOME CARE INSTRUCTIONS  Only take over-the-counter or prescription medicines for pain or discomfort as directed by your health care provider. The use of long-term narcotics is not recommended.  Lie down in a dark, quiet room when you have a migraine.  Keep a journal to find out what may trigger your migraine headaches. For example, write down:  What you eat and drink.  How much sleep you get.  Any change to your diet or medicines.  Limit alcohol consumption.  Quit smoking if you smoke.  Get 7-9 hours of sleep, or as recommended by your health care provider.  Limit stress.  Keep lights dim if bright lights bother you and make your migraines worse. SEEK IMMEDIATE MEDICAL CARE IF:   Your migraine becomes severe.  You have a fever.  You have a stiff neck.  You have vision loss.  You have muscular weakness or loss of muscle control.  You start losing your balance or have trouble walking.  You feel faint or pass out.  You have severe symptoms that are different from your first symptoms. MAKE SURE YOU:   Understand these instructions.  Will watch your condition.  Will get help right away if you are not doing well or get worse.   This information is not intended to replace advice given to you by your health care provider. Make sure you discuss any questions you have with your health care provider.   Document Released: 12/23/2004 Document Revised: 01/13/2014 Document Reviewed: 08/30/2012 Elsevier Interactive Patient Education [employer].

## 2014-11-21 ENCOUNTER — Encounter: Payer: Self-pay | Admitting: Adult Health

## 2014-11-21 ENCOUNTER — Ambulatory Visit (INDEPENDENT_AMBULATORY_CARE_PROVIDER_SITE_OTHER): Payer: 59 | Admitting: Adult Health

## 2014-11-21 ENCOUNTER — Other Ambulatory Visit (HOSPITAL_COMMUNITY)
Admission: RE | Admit: 2014-11-21 | Discharge: 2014-11-21 | Disposition: A | Discharge status: Home / Self Care | Payer: 59 | Source: Ambulatory Visit | Attending: Adult Health | Admitting: Adult Health

## 2014-11-21 VITALS — BP 156/82 | HR 80 | Ht 67.0 in | Wt 242.5 lb

## 2014-11-21 DIAGNOSIS — Z01419 Encounter for gynecological examination (general) (routine) without abnormal findings: Secondary | ICD-10-CM | POA: Diagnosis present

## 2014-11-21 DIAGNOSIS — Z1212 Encounter for screening for malignant neoplasm of rectum: Secondary | ICD-10-CM | POA: Diagnosis not present

## 2014-11-21 DIAGNOSIS — Z1151 Encounter for screening for human papillomavirus (HPV): Secondary | ICD-10-CM | POA: Diagnosis not present

## 2014-11-21 DIAGNOSIS — N898 Other specified noninflammatory disorders of vagina: Secondary | ICD-10-CM

## 2014-11-21 DIAGNOSIS — Z113 Encounter for screening for infections with a predominantly sexual mode of transmission: Secondary | ICD-10-CM | POA: Diagnosis present

## 2014-11-21 DIAGNOSIS — R52 Pain, unspecified: Secondary | ICD-10-CM | POA: Insufficient documentation

## 2014-11-21 DIAGNOSIS — K5900 Constipation, unspecified: Secondary | ICD-10-CM

## 2014-11-21 DIAGNOSIS — D259 Leiomyoma of uterus, unspecified: Secondary | ICD-10-CM

## 2014-11-21 HISTORY — DX: Pain, unspecified: R52

## 2014-11-21 HISTORY — DX: Constipation, unspecified: K59.00

## 2014-11-21 LAB — HEMOCCULT GUIAC POC 1CARD (OFFICE): Fecal Occult Blood, POC: NEGATIVE

## 2014-11-21 MED ORDER — GABAPENTIN 300 MG PO CAPS: 300.0000 mg | ORAL_CAPSULE | Freq: Two times a day (BID) | ORAL | Status: DC

## 2014-11-21 NOTE — Patient Instructions (Signed)
Try miralax for constipation Follow up in 4 weeks Physical in 1 year Mammogram yearly Colonoscopy per GI

## 2014-11-21 NOTE — Progress Notes (Addendum)
Patient ID: Becky [name], female   DOB: 07/05/1960, 54 y.o.   MRN: [id number] History of Present Illness: Becky [name] is a 54 year old black female,married in for a well woman gyn exam and pap.She complains of body aches and constipation.She was seen in the ER at [facility] for a headache and indigestion 11/12.She complains of left hand fingers tingling at times, and has trouble with right wrist and sees MD in [place]. She takes Voltaren but it makes her swell.She also complains of mild vaginal irritation.  PCP is [facility].  Current Medications, Allergies, Past Medical History, Past Surgical History, Family History and Social History were reviewed in [employer] record.     Review of Systems: Patient denies any  hearing loss, fatigue, blurred vision, shortness of breath, chest pain, abdominal pain, problems with urination, or intercourse. No joint pain or mood swings. See HPI for positives.   Physical Exam:BP 156/82 mmHg  Pulse 80  Ht 5\' 7"  (1.702 m)  Wt 242 lb 8 oz (109.997 kg)  BMI 37.97 kg/m2 General:  Well developed, well nourished, no acute distress Skin:  Warm and dry Neck:  Midline trachea, normal thyroid, good ROM, no lymphadenopathy Lungs; Clear to auscultation bilaterally Breast:  No dominant palpable mass, retraction, or nipple discharge Cardiovascular: Regular rate and rhythm Abdomen:  Soft, non tender, no hepatosplenomegaly Pelvic:  External genitalia is normal in appearance, no lesions.  The vagina is normal in appearance. Urethra has no lesions or masses. The cervix is bulbous.Pap with HPV and GC/CHL performed. Uterus is felt to be slightly enlarged,has known fibroids.  No adnexal masses or tenderness noted.Bladder is non tender, no masses felt. Rectal: Good sphincter tone, no polyps, or hemorrhoids felt.  Hemoccult negative. Extremities/musculoskeletal:  No swelling or varicosities noted, no clubbing or cyanosis,she is tender over extremities to  firm pressure,has good gripe in both hands Psych:  No mood changes, alert and cooperative,seems happy Will gabapentin to see if it helps with body aches.  Impression: Well woman gyn exam and pap Body aches Vaginal irritation Fibroids Constipation    Plan: Rx gabapentin 300 mg 1 bid#60 with 1 refill Follow up in 4 weeks Physical in 1 year Mammogram yearly Colonoscopy per GI Try miralax for constipation

## 2014-11-22 ENCOUNTER — Ambulatory Visit (HOSPITAL_COMMUNITY)
Admission: RE | Admit: 2014-11-22 | Discharge: 2014-11-22 | Disposition: A | Discharge status: Home / Self Care | Payer: 59 | Source: Ambulatory Visit | Attending: Obstetrics and Gynecology | Admitting: Obstetrics and Gynecology

## 2014-11-22 DIAGNOSIS — Z1231 Encounter for screening mammogram for malignant neoplasm of breast: Secondary | ICD-10-CM | POA: Diagnosis not present

## 2014-11-23 LAB — CYTOLOGY - PAP

## 2014-12-20 ENCOUNTER — Ambulatory Visit: Payer: 59 | Admitting: Adult Health

## 2015-01-10 ENCOUNTER — Ambulatory Visit: Payer: 59 | Admitting: Adult Health

## 2015-01-16 ENCOUNTER — Encounter: Payer: Self-pay | Admitting: Adult Health

## 2015-01-16 ENCOUNTER — Ambulatory Visit (INDEPENDENT_AMBULATORY_CARE_PROVIDER_SITE_OTHER): Payer: BLUE CROSS/BLUE SHIELD | Admitting: Adult Health

## 2015-01-16 VITALS — BP 140/80 | HR 78 | Ht 67.0 in | Wt 247.0 lb

## 2015-01-16 DIAGNOSIS — R52 Pain, unspecified: Secondary | ICD-10-CM | POA: Diagnosis not present

## 2015-01-16 MED ORDER — GABAPENTIN 300 MG PO CAPS: 300.0000 mg | ORAL_CAPSULE | Freq: Two times a day (BID) | ORAL | Status: DC

## 2015-01-16 NOTE — Progress Notes (Signed)
Subjective:     Patient ID: Becky [name], female   DOB: May 02, 1960, 55 y.o.   MRN: [id number]  HPI Becky [name] is a 40 black female, back in follow up of starting gabapentin in November for body aches and it helps, has occasional vaginal area itching no discharge and foot hurts.  Review of Systems Patient denies any headaches, hearing loss, fatigue, blurred vision, shortness of breath, chest pain, abdominal pain, problems with bowel movements, urination, or intercourse. No joint pain or mood swings.See HPI for positives, Reviewed past medical,surgical, social and family history. Reviewed medications and allergies.     Objective:   Physical Exam BP 140/80 mmHg  Pulse 78  Ht 5\' 7"  (1.702 m)  Wt 247 lb (112.038 kg)  BMI 38.68 kg/m2   Talk only, is feeling much better with gabapentin, but now foot hurts and has appointment with Dr Caprice Beaver end of the month.Will continue gabapentin, can increase to tid but she says its good bid.   Assessment:    Body aches     Plan:    Ok to use monistat for itching Refilled gabapentin 300 mg #60 1 bid  With 6 refills Follow up prn

## 2015-01-16 NOTE — Patient Instructions (Addendum)
Continue gabapentin  Follow up prn

## 2015-01-30 ENCOUNTER — Ambulatory Visit (INDEPENDENT_AMBULATORY_CARE_PROVIDER_SITE_OTHER): Payer: BLUE CROSS/BLUE SHIELD | Admitting: Family Medicine

## 2015-01-30 ENCOUNTER — Ambulatory Visit (INDEPENDENT_AMBULATORY_CARE_PROVIDER_SITE_OTHER): Payer: BLUE CROSS/BLUE SHIELD

## 2015-01-30 VITALS — BP 160/96 | HR 94 | Temp 98.3°F | Resp 18 | Ht 68.0 in | Wt 246.0 lb

## 2015-01-30 DIAGNOSIS — I10 Essential (primary) hypertension: Secondary | ICD-10-CM | POA: Diagnosis not present

## 2015-01-30 DIAGNOSIS — R0989 Other specified symptoms and signs involving the circulatory and respiratory systems: Secondary | ICD-10-CM

## 2015-01-30 DIAGNOSIS — R42 Dizziness and giddiness: Secondary | ICD-10-CM

## 2015-01-30 DIAGNOSIS — J0100 Acute maxillary sinusitis, unspecified: Secondary | ICD-10-CM | POA: Diagnosis not present

## 2015-01-30 LAB — POCT CBC
Granulocyte percent: 68.1 %G (ref 37–80)
HCT, POC: 41.1 % (ref 37.7–47.9)
Hemoglobin: 14 g/dL (ref 12.2–16.2)
Lymph, poc: 1.9 (ref 0.6–3.4)
MCH, POC: 30.4 pg (ref 27–31.2)
MCHC: 34.1 g/dL (ref 31.8–35.4)
MCV: 89.3 fL (ref 80–97)
MID (cbc): 0.4 (ref 0–0.9)
MPV: 7.5 fL (ref 0–99.8)
POC Granulocyte: 4.8 (ref 2–6.9)
POC LYMPH PERCENT: 26.9 %L (ref 10–50)
POC MID %: 5 %M (ref 0–12)
Platelet Count, POC: 356 10*3/uL (ref 142–424)
RBC: 4.6 M/uL (ref 4.04–5.48)
RDW, POC: 13.4 %
WBC: 7.1 10*3/uL (ref 4.6–10.2)

## 2015-01-30 MED ORDER — SPIRONOLACTONE 25 MG PO TABS: 12.5000 mg | ORAL_TABLET | Freq: Every day | ORAL | Status: DC

## 2015-01-30 MED ORDER — AMOXICILLIN-POT CLAVULANATE 875-125 MG PO TABS: 1.0000 | ORAL_TABLET | Freq: Two times a day (BID) | ORAL | Status: DC

## 2015-01-30 NOTE — Patient Instructions (Signed)
Spironolactone tablets  What is this medicine?  SPIRONOLACTONE (speer on oh LAK tone) is a diuretic. It helps you make more urine and to lose excess water from your body. This medicine is used to treat high blood pressure, and edema or swelling from heart, kidney, or liver disease. It is also used to treat patients who make too much aldosterone or have low potassium.  This medicine may be used for other purposes; ask your health care provider or pharmacist if you have questions.  What should I tell my health care provider before I take this medicine?  They need to know if you have any of these conditions:  -high blood level of potassium  -kidney disease or trouble making urine  -liver disease  -an unusual or allergic reaction to spironolactone, other medicines, foods, dyes, or preservatives  -pregnant or trying to get pregnant  -breast-feeding  How should I use this medicine?  Take this medicine by mouth with a drink of water. Follow the directions on your prescription label. You can take it with or without food. If it upsets your stomach, take it with food. Do not take your medicine more often than directed. Remember that you will need to pass more urine after taking this medicine. Do not take your doses at a time of day that will cause you problems. Do not take at bedtime.  Talk to your pediatrician regarding the use of this medicine in children. While this drug may be prescribed for selected conditions, precautions do apply.  Overdosage: If you think you have taken too much of this medicine contact a poison control center or emergency room at once.  NOTE: This medicine is only for you. Do not share this medicine with others.  What if I miss a dose?  If you miss a dose, take it as soon as you can. If it is almost time for your next dose, take only that dose. Do not take double or extra doses.  What may interact with this medicine?  Do not take this medicine with any of the following medications:  -eplerenone  This  medicine may also interact with the following medications:  -corticosteroids  -digoxin  -lithium  -medicines for high blood pressure like ACE inhibitors  -skeletal muscle relaxants like tubocurarine  -NSAIDs, medicines for pain and inflammation, like ibuprofen or naproxen  -potassium products like salt substitute or supplements  -pressor amines like norepinephrine  -some diuretics  This list may not describe all possible interactions. Give your health care provider a list of all the medicines, herbs, non-prescription drugs, or dietary supplements you use. Also tell them if you smoke, drink alcohol, or use illegal drugs. Some items may interact with your medicine.  What should I watch for while using this medicine?  Visit your doctor or health care professional for regular checks on your progress. Check your blood pressure as directed. Ask your doctor what your blood pressure should be, and when you should contact them.  You may need to be on a special diet while taking this medicine. Ask your doctor. Also, ask how many glasses of fluid you need to drink a day. You must not get dehydrated.  This medicine may make you feel confused, dizzy or lightheaded. Drinking alcohol and taking some medicines can make this worse. Do not drive, use machinery, or do anything that needs mental alertness until you know how this medicine affects you. Do not sit or stand up quickly.  What side effects may I notice   from receiving this medicine?  Side effects that you should report to your doctor or health care professional as soon as possible:  -allergic reactions such as skin rash or itching, hives, swelling of the lips, mouth, tongue, or throat  -black or tarry stools  -fast, irregular heartbeat  -fever  -muscle pain, cramps  -numbness, tingling in hands or feet  -trouble breathing  -trouble passing urine  -unusual bleeding  -unusually weak or tired  Side effects that usually do not require medical attention (report to your doctor or  health care professional if they continue or are bothersome):  -change in voice or hair growth  -confusion  -dizzy, drowsy  -dry mouth, increased thirst  -enlarged or tender breasts  -headache  -irregular menstrual periods  -sexual difficulty, unable to have an erection  -stomach upset  This list may not describe all possible side effects. Call your doctor for medical advice about side effects. You may report side effects to FDA at 1-800-FDA-1088.  Where should I keep my medicine?  Keep out of the reach of children.  Store below 25 degrees C (77 degrees F). Throw away any unused medicine after the expiration date.  NOTE: This sheet is a summary. It may not cover all possible information. If you have questions about this medicine, talk to your doctor, pharmacist, or health care provider.      2016, Elsevier/Gold Standard. (2009-09-04 12:51:30)

## 2015-01-30 NOTE — Progress Notes (Signed)
 Chief Complaint:  Chief Complaint  Patient presents with  . Hypertension  . Headache    HPI: Becky [name] is a 55 y.o. female who reports to [facility] today complaining of  With vague complaints of HTN not well controlled for the last several weeks, acute on chronic  Headaches , sinus xs, and allergies, rhinorrhea, she took tylenol sinus flu without releif .  She was recently on amox for sinus issues and did not help, taking flonase as well.  She has been taking her BP meds, norvasc 10 mg and also losartan 100 mg daily but BP still runnig high at home. she was cramping too much on HCTZ  She was on lisinopril regulated, then it stopped.  She was given oral steroid in the past and that made her swell. She was given steroid  IM today for her feet pain.  She has had BP issues for last 3 weeks.   BP Readings from Last 3 Encounters:  01/30/15 160/96  01/16/15 140/80  11/21/14 156/82   Wt Readings from Last 3 Encounters:  01/30/15 246 lb (111.585 kg)  01/16/15 247 lb (112.038 kg)  11/21/14 242 lb 8 oz (109.997 kg)    She has had some chest congestion, she gets some dizziness but no CP  She has had no palpitations. She had some numbness in her left hand for the last 2 months  and now her hand is better She has no jaw pain, she has neck pain but that is chronic . Denies carpal tunnel . She has a hx of migraines Denies fevers or chill, SOB , vision changes. She has taken flonase without releif.   Home BP readings-highest in the night 175/105,Then 165/95, 155/94 140s-90s/175s-90s-105  She has thyroid diseases and also hyperlipidemia but that has not been checked recently.    Past Medical History  Diagnosis Date  . Hypertension   . Thyroid disease   . Back pain   . Migraines   . Vaginal irritation 05/23/2013  . Hematuria 05/23/2013  . BV (bacterial vaginosis) 05/23/2013  . Hyperlipidemia   . Pelvic pain in female 11/02/2013  . Elevated cholesterol 11/02/2013  . Plantar  fasciitis of right foot   . Vaginal itching 03/24/2014  . Vaginal discharge 03/24/2014  . Body aches 11/21/2014  . Constipation 11/21/2014   Past Surgical History  Procedure Laterality Date  . Ectopic pregnancy surgery    . Ileocolonoscopy  12/19/2009    [id number] polyps/mild left-side diverticulosis/hemorrhoids  . Esophagogastroduodenoscopy  12/19/2009    [id number] stricture s/p dilation/mild gastritis   Social History   Social History  . Marital Status: Married    Spouse Name: N/A  . Number of Children: N/A  . Years of Education: N/A   Social History Main Topics  . Smoking status: Former Smoker    Types: Cigarettes  . Smokeless tobacco: Never Used  . Alcohol Use: No  . Drug Use: No  . Sexual Activity: Yes    Birth Control/ Protection: Post-menopausal   Other Topics Concern  . None   Social History Narrative   Family History  Problem Relation Age of Onset  . Heart failure Mother   . Hypertension Mother   . Diabetes Mother   . Heart failure Father   . Hypertension Father   . Heart failure Brother   . Hypertension Sister   . Other Sister     blocked artery in neck; knee replacement  . Other Brother  triple bypass surgery  . Hypertension Sister   . Diabetes Sister    Allergies  Allergen Reactions  . Prednisone    Prior to Admission medications   Medication Sig Start Date End Date Taking? Authorizing Provider  amLODipine (NORVASC) 10 MG tablet Take 10 mg by mouth daily. 09/21/14  Yes Historical Provider, MD  fluticasone (FLONASE) 50 MCG/ACT nasal spray Place 2 sprays into the nose as needed for allergies or rhinitis.    Yes Historical Provider, MD  gabapentin (NEURONTIN) 300 MG capsule Take 1 capsule (300 mg total) by mouth 2 (two) times daily. 01/16/15  Yes Estill Dooms, NP  KLOR-CON M20 20 MEQ tablet Take 20 mEq by mouth as needed.  06/25/11  Yes Historical Provider, MD  levothyroxine (SYNTHROID, LEVOTHROID) 88 MCG tablet Take 1 tablet (88 mcg  total) by mouth every morning. 06/15/14  Yes Estill Dooms, NP  losartan (COZAAR) 100 MG tablet Take 100 mg by mouth daily. 09/21/14  Yes Historical Provider, MD  simvastatin (ZOCOR) 20 MG tablet Take 1 tablet (20 mg total) by mouth daily. 11/07/13  Yes Estill Dooms, NP  amoxicillin (AMOXIL) 500 MG tablet Take 500 mg by mouth 2 (two) times daily. Reported on 01/30/2015    Historical Provider, MD  diclofenac (VOLTAREN) 75 MG EC tablet Take 75 mg by mouth as needed. Reported on 01/30/2015    Historical Provider, MD     ROS: The patient denies fevers, chills, night sweats, unintentional weight loss, chest pain, palpitations, wheezing, dyspnea on exertion, nausea, vomiting, abdominal pain, dysuria, hematuria, melena, numbness, weakness, or tingling.   All other systems have been reviewed and were otherwise negative with the exception of those mentioned in the HPI and as above.    PHYSICAL EXAM: Filed Vitals:   01/30/15 1941  BP: 160/96  Pulse: 94  Temp: 98.3 F (36.8 C)  Resp: 18   SpO2 Readings from Last 3 Encounters:  01/30/15 94%  11/19/14 100%  06/14/14 97%    Body mass index is 37.41 kg/(m^2).   General: Alert, no acute distress HEENT:  Normocephalic, atraumatic, oropharynx patent. EOMI, PERRLA Erythematous throat, no exudates, TM normal, + sinus tenderness, + erythematous/boggy nasal mucosa Cardiovascular:  Regular rate and rhythm, no rubs murmurs or gallops.  No Carotid bruits, radial pulse intact. No pedal edema.  Respiratory: Clear to auscultation bilaterally.  No wheezes, rales, or rhonchi.  No cyanosis, no use of accessory musculature Abdominal: No organomegaly, abdomen is soft and non-tender, positive bowel sounds. No masses. Skin: No rashes. Neurologic: Facial musculature symmetric. Psychiatric: Patient acts appropriately throughout our interaction. Lymphatic: No cervical or submandibular lymphadenopathy Musculoskeletal: Gait intact. No edema,  tenderness   LABS: Results for orders placed or performed in visit on 01/30/15  POCT CBC  Result Value Ref Range   WBC 7.1 4.6 - 10.2 K/uL   Lymph, poc 1.9 0.6 - 3.4   POC LYMPH PERCENT 26.9 10 - 50 %L   MID (cbc) 0.4 0 - 0.9   POC MID % 5.0 0 - 12 %M   POC Granulocyte 4.8 2 - 6.9   Granulocyte percent 68.1 37 - 80 %G   RBC 4.60 4.04 - 5.48 M/uL   Hemoglobin 14.0 12.2 - 16.2 g/dL   HCT, POC 41.1 37.7 - 47.9 %   MCV 89.3 80 - 97 fL   MCH, POC 30.4 27 - 31.2 pg   MCHC 34.1 31.8 - 35.4 g/dL   RDW, POC 13.4 %   Platelet  Count, POC 356 142 - 424 K/uL   MPV 7.5 0 - 99.8 fL     EKG/XRAY:   Primary read interpreted by Dr. Marin Comment at [facility]. EKG 1 PAC Increase interstitial lung markings , no effusion, no pneumo, + cardiomegaly   ASSESSMENT/PLAN: Encounter Diagnoses  Name Primary?  . Essential hypertension   . Dizziness and giddiness   . Chest congestion   . Acute maxillary sinusitis, recurrence not specified Yes   Go to Er prn  Rx spironolactone 12.5 mg daily , if BP not well controlled and > 140/90 then will increase to 25 mg daily. Stop Potassium pills. Needs fu in 2 weeks. Labs pending Monitor BP at home, BP goal should be less than 140/90 She needs to return here or to her PCP for recheck of her BP and also fasting labs Rx augmentin for sinusitis, cont with flonase Fu in 2 weeks.   Gross sideeffects, risk and benefits, and alternatives of medications d/w patient. Patient is aware that all medications have potential sideeffects and we are unable to predict every sideeffect or drug-drug interaction that may occur.    DO  01/30/2015 9:18 PM

## 2015-01-31 LAB — COMPLETE METABOLIC PANEL WITH GFR
ALT: 36 U/L — ABNORMAL HIGH (ref 6–29)
AST: 22 U/L (ref 10–35)
Albumin: 4.6 g/dL (ref 3.6–5.1)
Alkaline Phosphatase: 86 U/L (ref 33–130)
BUN: 13 mg/dL (ref 7–25)
CO2: 25 mmol/L (ref 20–31)
Calcium: 10 mg/dL (ref 8.6–10.4)
Chloride: 104 mmol/L (ref 98–110)
Creat: 0.77 mg/dL (ref 0.50–1.05)
GFR, Est African American: >89 mL/min (ref >=60)
GFR, Est Non African American: 88 mL/min (ref >=60)
Glucose, Bld: 135 mg/dL — ABNORMAL HIGH (ref 65–99)
Potassium: 4.4 mmol/L (ref 3.5–5.3)
Sodium: 139 mmol/L (ref 135–146)
Total Bilirubin: 0.5 mg/dL (ref 0.2–1.2)
Total Protein: 7.2 g/dL (ref 6.1–8.1)

## 2015-01-31 LAB — TSH: TSH: 0.402 u[IU]/mL (ref 0.350–4.500)

## 2015-02-12 ENCOUNTER — Telehealth: Payer: Self-pay

## 2015-02-12 ENCOUNTER — Ambulatory Visit (INDEPENDENT_AMBULATORY_CARE_PROVIDER_SITE_OTHER): Payer: BLUE CROSS/BLUE SHIELD

## 2015-02-12 ENCOUNTER — Ambulatory Visit (INDEPENDENT_AMBULATORY_CARE_PROVIDER_SITE_OTHER): Payer: BLUE CROSS/BLUE SHIELD | Admitting: Family Medicine

## 2015-02-12 VITALS — BP 132/80 | HR 70 | Temp 98.1°F | Resp 16 | Ht 68.0 in | Wt 245.8 lb

## 2015-02-12 DIAGNOSIS — M545 Low back pain, unspecified: Secondary | ICD-10-CM

## 2015-02-12 DIAGNOSIS — M25552 Pain in left hip: Secondary | ICD-10-CM

## 2015-02-12 DIAGNOSIS — I10 Essential (primary) hypertension: Secondary | ICD-10-CM | POA: Diagnosis not present

## 2015-02-12 DIAGNOSIS — N390 Urinary tract infection, site not specified: Secondary | ICD-10-CM

## 2015-02-12 DIAGNOSIS — A499 Bacterial infection, unspecified: Secondary | ICD-10-CM

## 2015-02-12 LAB — COMPLETE METABOLIC PANEL WITH GFR
ALT: 29 U/L (ref 6–29)
AST: 17 U/L (ref 10–35)
Albumin: 4.3 g/dL (ref 3.6–5.1)
Alkaline Phosphatase: 74 U/L (ref 33–130)
BUN: 11 mg/dL (ref 7–25)
CO2: 29 mmol/L (ref 20–31)
Calcium: 9.7 mg/dL (ref 8.6–10.4)
Chloride: 102 mmol/L (ref 98–110)
Creat: 0.82 mg/dL (ref 0.50–1.05)
GFR, Est African American: >89 mL/min (ref >=60)
GFR, Est Non African American: 82 mL/min (ref >=60)
Glucose, Bld: 81 mg/dL (ref 65–99)
Potassium: 4.1 mmol/L (ref 3.5–5.3)
Sodium: 141 mmol/L (ref 135–146)
Total Bilirubin: 0.8 mg/dL (ref 0.2–1.2)
Total Protein: 6.7 g/dL (ref 6.1–8.1)

## 2015-02-12 LAB — POCT URINALYSIS DIP (MANUAL ENTRY)
Bilirubin, UA: NEGATIVE
Glucose, UA: NEGATIVE
Ketones, POC UA: NEGATIVE
Nitrite, UA: NEGATIVE
Protein Ur, POC: NEGATIVE
Spec Grav, UA: 1.02
Urobilinogen, UA: 0.2
pH, UA: 6

## 2015-02-12 LAB — POC MICROSCOPIC URINALYSIS (UMFC): Mucus: ABSENT

## 2015-02-12 MED ORDER — CEPHALEXIN 500 MG PO CAPS: 500.0000 mg | ORAL_CAPSULE | Freq: Four times a day (QID) | ORAL | Status: DC

## 2015-02-12 MED ORDER — CEPHALEXIN 500 MG PO CAPS: 500.0000 mg | ORAL_CAPSULE | Freq: Two times a day (BID) | ORAL | Status: DC

## 2015-02-12 MED ORDER — CYCLOBENZAPRINE HCL 5 MG PO TABS: 5.0000 mg | ORAL_TABLET | Freq: Three times a day (TID) | ORAL | Status: DC | PRN

## 2015-02-12 NOTE — Patient Instructions (Signed)
Because you received an x-ray today, you will receive an invoice from [facility]. Please contact [facility] at [phone] with questions or concerns regarding your invoice. Our billing staff will not be able to assist you with those questions. °

## 2015-02-12 NOTE — Progress Notes (Signed)
Chief Complaint:  Chief Complaint  Patient presents with  . Leg Pain    Bilateral,  . Back Pain    low back  . Labs Only    HPI: Becky [name] is a 55 y.o. female who reports to [facility] today complaining of  Acute on chronic left hip and back pain without sciatica She has had hx of L5-S1 DJD, took NSAIDs and also some left over hydrocodone shehad, it let her sleep but she does nto want anything that will cause her to have nausea.  She also is here for recheck of her labs on spironolactone 12.5 mg daily to help with BP. Will recheck CMP today/ She states her BP has been well controlled with the new addition, she has had BPs less than 140/90 Denies any CP , SOB or palpitations.  Also has had some urianry issues but not incontinence with back pain  She has had joint pain and msk pain, she is not sure if worse since being on statin. She thinks this might be the case.   Past Medical History  Diagnosis Date  . Hypertension   . Thyroid disease   . Back pain   . Migraines   . Vaginal irritation 05/23/2013  . Hematuria 05/23/2013  . BV (bacterial vaginosis) 05/23/2013  . Hyperlipidemia   . Pelvic pain in female 11/02/2013  . Elevated cholesterol 11/02/2013  . Plantar fasciitis of right foot   . Vaginal itching 03/24/2014  . Vaginal discharge 03/24/2014  . Body aches 11/21/2014  . Constipation 11/21/2014   Past Surgical History  Procedure Laterality Date  . Ectopic pregnancy surgery    . Ileocolonoscopy  12/19/2009    [id number] polyps/mild left-side diverticulosis/hemorrhoids  . Esophagogastroduodenoscopy  12/19/2009    [id number] stricture s/p dilation/mild gastritis   Social History   Social History  . Marital Status: Married    Spouse Name: N/A  . Number of Children: N/A  . Years of Education: N/A   Social History Main Topics  . Smoking status: Former Smoker    Types: Cigarettes  . Smokeless tobacco: Never Used  . Alcohol Use: No  . Drug Use: No  .  Sexual Activity: Yes    Birth Control/ Protection: Post-menopausal   Other Topics Concern  . None   Social History Narrative   Family History  Problem Relation Age of Onset  . Heart failure Mother   . Hypertension Mother   . Diabetes Mother   . Heart failure Father   . Hypertension Father   . Heart failure Brother   . Hypertension Sister   . Other Sister     blocked artery in neck; knee replacement  . Other Brother     triple bypass surgery  . Hypertension Sister   . Diabetes Sister    Allergies  Allergen Reactions  . Prednisone    Prior to Admission medications   Medication Sig Start Date End Date Taking? Authorizing Provider  amLODipine (NORVASC) 10 MG tablet Take 10 mg by mouth daily. 09/21/14  Yes Historical Provider, MD  diclofenac (VOLTAREN) 75 MG EC tablet Take 75 mg by mouth as needed. Reported on 01/30/2015   Yes Historical Provider, MD  fluticasone (FLONASE) 50 MCG/ACT nasal spray Place 2 sprays into the nose as needed for allergies or rhinitis.    Yes Historical Provider, MD  gabapentin (NEURONTIN) 300 MG capsule Take 1 capsule (300 mg total) by mouth 2 (two) times daily. 01/16/15  Yes Estill Dooms,  NP  KLOR-CON M20 20 MEQ tablet Take 20 mEq by mouth as needed.  06/25/11  Yes Historical Provider, MD  levothyroxine (SYNTHROID, LEVOTHROID) 88 MCG tablet Take 1 tablet (88 mcg total) by mouth every morning. 06/15/14  Yes Estill Dooms, NP  losartan (COZAAR) 100 MG tablet Take 100 mg by mouth daily. 09/21/14  Yes Historical Provider, MD  simvastatin (ZOCOR) 20 MG tablet Take 1 tablet (20 mg total) by mouth daily. 11/07/13  Yes Estill Dooms, NP  spironolactone (ALDACTONE) 25 MG tablet Take 0.5 tablets (12.5 mg total) by mouth daily. 01/30/15  Yes Abigaelle Verley P Christ Fullenwider, DO  amoxicillin-clavulanate (AUGMENTIN) 875-125 MG tablet Take 1 tablet by mouth 2 (two) times daily. Patient not taking: Reported on 02/12/2015 01/30/15   Katleen Carraway P Santosh Petter, DO     ROS: The patient denies fevers,  chills, night sweats, unintentional weight loss, chest pain, palpitations, wheezing, dyspnea on exertion, nausea, vomiting, abdominal pain, dysuria, hematuria, melena, numbness, weakness, or tingling.   All other systems have been reviewed and were otherwise negative with the exception of those mentioned in the HPI and as above.    PHYSICAL EXAM: Filed Vitals:   02/12/15 1254  BP: 132/80  Pulse: 70  Temp: 98.1 F (36.7 C)  Resp: 16   Body mass index is 37.38 kg/(m^2).   General: Alert, no acute distress HEENT:  Normocephalic, atraumatic, oropharynx patent. EOMI, PERRLA Cardiovascular:  Regular rate and rhythm, no rubs murmurs or gallops.  No Carotid bruits, radial pulse intact. No pedal edema.  Respiratory: Clear to auscultation bilaterally.  No wheezes, rales, or rhonchi.  No cyanosis, no use of accessory musculature Abdominal: No organomegaly, abdomen is soft and non-tender, positive bowel sounds. No masses. Skin: No rashes. Neurologic: Facial musculature symmetric. Psychiatric: Patient acts appropriately throughout our interaction. Lymphatic: No cervical or submandibular lymphadenopathy Musculoskeletal: Gait intact. No edema, tenderness + paramsk tenderness , bilateral and midline Decrease ROM 5/5 strength, 2/2 DTRs No saddle anesthesia Straight leg negative Hip and knee exam--normal  ER and IR . Full ROM , neg tenderness    LABS: Results for orders placed or performed in visit on 02/12/15  POCT Microscopic Urinalysis ([facility])  Result Value Ref Range   WBC,UR,HPF,POC Few (A) None WBC/hpf   RBC,UR,HPF,POC Moderate (A) None RBC/hpf   Bacteria None None, Too numerous to count   Mucus Absent Absent   Epithelial Cells, UR Per Microscopy Few (A) None, Too numerous to count cells/hpf  POCT urinalysis dipstick  Result Value Ref Range   Color, UA yellow yellow   Clarity, UA clear clear   Glucose, UA negative negative   Bilirubin, UA negative negative   Ketones, POC UA  negative negative   Spec Grav, UA 1.020    Blood, UA moderate (A) negative   pH, UA 6.0    Protein Ur, POC negative negative   Urobilinogen, UA 0.2    Nitrite, UA Negative Negative   Leukocytes, UA small (1+) (A) Negative     EKG/XRAY:   Primary read interpreted by Dr. Marin Comment at [facility]. Neg for any new changes in hip or L spine   ASSESSMENT/PLAN: Encounter Diagnoses  Name Primary?  . Essential hypertension Yes  . Left hip pain   . Left-sided low back pain without sciatica   . UTI (urinary tract infection), bacterial    Urine cx pending Rx Keflex 500mg  BID  Rx Flexeril 5 mg PO TID prn back pain  We started her on spironolactone 12.5 mg and BP  at home has been in the 115/70s-130/80s. Will get repeat CMP  She has always had joint and msk pains with the statin she is on, I have told her that once she gets her UTI taken care of she may want to do a trial of lowering her statin from20 to 10 mg  Or 1/2 the dose to see if she has less msk and jt pain.  She will need to get that rechecked by her PCP in 2-3 months if she decreases her statin from 20 to 10 mg.     Gross sideeffects, risk and benefits, and alternatives of medications d/w patient. Patient is aware that all medications have potential sideeffects and we are unable to predict every sideeffect or drug-drug interaction that may occur.  Kassi Esteve DO  02/12/2015 2:11 PM

## 2015-02-14 LAB — URINE CULTURE: Colony Count: 80000

## 2015-03-21 ENCOUNTER — Encounter: Payer: Self-pay | Admitting: Family Medicine

## 2015-03-21 ENCOUNTER — Ambulatory Visit: Payer: BLUE CROSS/BLUE SHIELD | Admitting: Adult Health

## 2015-03-28 ENCOUNTER — Encounter: Payer: Self-pay | Admitting: Adult Health

## 2015-03-28 ENCOUNTER — Ambulatory Visit (INDEPENDENT_AMBULATORY_CARE_PROVIDER_SITE_OTHER): Payer: BLUE CROSS/BLUE SHIELD | Admitting: Adult Health

## 2015-03-28 VITALS — BP 172/96 | HR 74 | Ht 67.0 in | Wt 250.0 lb

## 2015-03-28 DIAGNOSIS — R300 Dysuria: Secondary | ICD-10-CM | POA: Diagnosis not present

## 2015-03-28 DIAGNOSIS — R52 Pain, unspecified: Secondary | ICD-10-CM | POA: Diagnosis not present

## 2015-03-28 DIAGNOSIS — E7800 Pure hypercholesterolemia, unspecified: Secondary | ICD-10-CM

## 2015-03-28 DIAGNOSIS — R319 Hematuria, unspecified: Secondary | ICD-10-CM

## 2015-03-28 DIAGNOSIS — Z139 Encounter for screening, unspecified: Secondary | ICD-10-CM

## 2015-03-28 LAB — POCT URINALYSIS DIPSTICK
Glucose, UA: NEGATIVE
Nitrite, UA: NEGATIVE
Protein, UA: NEGATIVE

## 2015-03-28 NOTE — Progress Notes (Signed)
Subjective:     Patient ID: Becky [name], female   DOB: [birth year], 55 y.o.   MRN: [id number]  HPI Becky [name] is s 55 year old black female in complaining of burning with urination and leg cramps and body aches.She pushed water and feels better but wants labs checked, she stopped cholesterol meds due to leg cramps.   Review of Systems Patient denies any headaches, hearing loss, fatigue, blurred vision, shortness of breath, chest pain, abdominal pain, problems with bowel movements,  or intercourse. No mood swings.See HPI for positives.  Reviewed past medical,surgical, social and family history. Reviewed medications and allergies.     Objective:   Physical Exam BP 172/96 mmHg  Pulse 74  Ht 5\' 7"  (1.702 m)  Wt 250 lb (113.399 kg)  BMI 39.15 kg/m2urine 2= blood and 1+ leuks, Skin warm and dry. Neck: mid line trachea, normal thyroid, good ROM, no lymphadenopathy noted. Lungs: clear to ausculation bilaterally. Cardiovascular: regular rate and rhythm.Will check labs today    Assessment:     Body aches Elevated cholesterol Burning with urination Hematuria     Plan:     Check CBC,CMP,TSH and lipids,A1c and vitamin D Push fluids  Will talk when labs back

## 2015-03-28 NOTE — Patient Instructions (Signed)
Increase water  Get labs today Will talk in next 2 days

## 2015-03-29 LAB — COMPREHENSIVE METABOLIC PANEL
ALT: 29 IU/L (ref 0–32)
AST: 17 IU/L (ref 0–40)
Albumin/Globulin Ratio: 1.7 (ref 1.2–2.2)
Albumin: 4.3 g/dL (ref 3.5–5.5)
Alkaline Phosphatase: 88 IU/L (ref 39–117)
BUN/Creatinine Ratio: 12 (ref 9–23)
BUN: 10 mg/dL (ref 6–24)
Bilirubin Total: 0.6 mg/dL (ref 0.0–1.2)
CO2: 27 mmol/L (ref 18–29)
Calcium: 10.1 mg/dL (ref 8.7–10.2)
Chloride: 100 mmol/L (ref 96–106)
Creatinine, Ser: 0.84 mg/dL (ref 0.57–1.00)
GFR calc Af Amer: 92 mL/min/{1.73_m2} (ref >59)
GFR calc non Af Amer: 80 mL/min/{1.73_m2} (ref >59)
Globulin, Total: 2.5 g/dL (ref 1.5–4.5)
Glucose: 92 mg/dL (ref 65–99)
Potassium: 4.1 mmol/L (ref 3.5–5.2)
Sodium: 141 mmol/L (ref 134–144)
Total Protein: 6.8 g/dL (ref 6.0–8.5)

## 2015-03-29 LAB — LIPID PANEL
Chol/HDL Ratio: 3.9 ratio units (ref 0.0–4.4)
Cholesterol, Total: 268 mg/dL — ABNORMAL HIGH (ref 100–199)
HDL: 68 mg/dL (ref >39)
LDL Calculated: 182 mg/dL — ABNORMAL HIGH (ref 0–99)
Triglycerides: 91 mg/dL (ref 0–149)
VLDL Cholesterol Cal: 18 mg/dL (ref 5–40)

## 2015-03-29 LAB — CBC
Hematocrit: 40 % (ref 34.0–46.6)
Hemoglobin: 13.6 g/dL (ref 11.1–15.9)
MCH: 30.1 pg (ref 26.6–33.0)
MCHC: 34 g/dL (ref 31.5–35.7)
MCV: 89 fL (ref 79–97)
Platelets: 375 10*3/uL (ref 150–379)
RBC: 4.52 x10E6/uL (ref 3.77–5.28)
RDW: 14.2 % (ref 12.3–15.4)
WBC: 7.3 10*3/uL (ref 3.4–10.8)

## 2015-03-29 LAB — URINALYSIS, ROUTINE W REFLEX MICROSCOPIC
Bilirubin, UA: NEGATIVE
Glucose, UA: NEGATIVE
Ketones, UA: NEGATIVE
Nitrite, UA: NEGATIVE
Protein, UA: NEGATIVE
Specific Gravity, UA: 1.006 (ref 1.005–1.030)
Urobilinogen, Ur: 0.2 mg/dL (ref 0.2–1.0)
pH, UA: 7.5 (ref 5.0–7.5)

## 2015-03-29 LAB — MICROSCOPIC EXAMINATION
Bacteria, UA: NONE SEEN
Casts: NONE SEEN /lpf

## 2015-03-29 LAB — VITAMIN D 25 HYDROXY (VIT D DEFICIENCY, FRACTURES): Vit D, 25-Hydroxy: 28.7 ng/mL — ABNORMAL LOW (ref 30.0–100.0)

## 2015-03-29 LAB — HEMOGLOBIN A1C
Est. average glucose Bld gHb Est-mCnc: 137 mg/dL
Hgb A1c MFr Bld: 6.4 % — ABNORMAL HIGH (ref 4.8–5.6)

## 2015-03-29 LAB — TSH: TSH: 1.84 u[IU]/mL (ref 0.450–4.500)

## 2015-03-30 ENCOUNTER — Telehealth: Payer: Self-pay | Admitting: Adult Health

## 2015-03-30 LAB — URINE CULTURE: Organism ID, Bacteria: NO GROWTH

## 2015-03-30 MED ORDER — CHOLECALCIFEROL 50 MCG (2000 UT) PO TABS: ORAL_TABLET | ORAL | Status: DC

## 2015-03-30 NOTE — Telephone Encounter (Signed)
Pt aware of labs and urine, take vitamin D 3 2000 IU daily and decrease carbs and fats and increase exercise, and repeat A1c and CMP and lipids in 3 months, placed in recall

## 2015-05-04 DIAGNOSIS — J302 Other seasonal allergic rhinitis: Secondary | ICD-10-CM | POA: Diagnosis not present

## 2015-05-04 DIAGNOSIS — E6609 Other obesity due to excess calories: Secondary | ICD-10-CM | POA: Diagnosis not present

## 2015-05-04 DIAGNOSIS — Z6836 Body mass index (BMI) 36.0-36.9, adult: Secondary | ICD-10-CM | POA: Diagnosis not present

## 2015-05-04 DIAGNOSIS — Z1389 Encounter for screening for other disorder: Secondary | ICD-10-CM | POA: Diagnosis not present

## 2015-05-10 DIAGNOSIS — T07 Unspecified multiple injuries: Secondary | ICD-10-CM | POA: Diagnosis not present

## 2015-05-10 DIAGNOSIS — Z6837 Body mass index (BMI) 37.0-37.9, adult: Secondary | ICD-10-CM | POA: Diagnosis not present

## 2015-05-10 DIAGNOSIS — Z1389 Encounter for screening for other disorder: Secondary | ICD-10-CM | POA: Diagnosis not present

## 2015-05-15 DIAGNOSIS — B351 Tinea unguium: Secondary | ICD-10-CM | POA: Diagnosis not present

## 2015-05-15 DIAGNOSIS — M79673 Pain in unspecified foot: Secondary | ICD-10-CM | POA: Diagnosis not present

## 2015-05-15 DIAGNOSIS — M722 Plantar fascial fibromatosis: Secondary | ICD-10-CM | POA: Diagnosis not present

## 2015-05-22 ENCOUNTER — Emergency Department (HOSPITAL_COMMUNITY)
Admission: EM | Admit: 2015-05-22 | Discharge: 2015-05-22 | Disposition: A | Discharge status: Home / Self Care | Payer: BLUE CROSS/BLUE SHIELD | Attending: Emergency Medicine | Admitting: Emergency Medicine

## 2015-05-22 ENCOUNTER — Encounter (HOSPITAL_COMMUNITY): Payer: Self-pay | Admitting: Emergency Medicine

## 2015-05-22 ENCOUNTER — Emergency Department (HOSPITAL_COMMUNITY): Payer: BLUE CROSS/BLUE SHIELD

## 2015-05-22 DIAGNOSIS — Z87891 Personal history of nicotine dependence: Secondary | ICD-10-CM | POA: Insufficient documentation

## 2015-05-22 DIAGNOSIS — M79622 Pain in left upper arm: Secondary | ICD-10-CM | POA: Diagnosis not present

## 2015-05-22 DIAGNOSIS — E785 Hyperlipidemia, unspecified: Secondary | ICD-10-CM | POA: Diagnosis not present

## 2015-05-22 DIAGNOSIS — M79602 Pain in left arm: Secondary | ICD-10-CM | POA: Diagnosis not present

## 2015-05-22 DIAGNOSIS — I10 Essential (primary) hypertension: Secondary | ICD-10-CM | POA: Diagnosis not present

## 2015-05-22 DIAGNOSIS — Z79899 Other long term (current) drug therapy: Secondary | ICD-10-CM | POA: Insufficient documentation

## 2015-05-22 DIAGNOSIS — R0602 Shortness of breath: Secondary | ICD-10-CM | POA: Diagnosis not present

## 2015-05-22 LAB — COMPREHENSIVE METABOLIC PANEL
ALT: 30 U/L (ref 14–54)
AST: 20 U/L (ref 15–41)
Albumin: 4.2 g/dL (ref 3.5–5.0)
Alkaline Phosphatase: 85 U/L (ref 38–126)
Anion gap: 8 (ref 5–15)
BUN: 15 mg/dL (ref 6–20)
CO2: 26 mmol/L (ref 22–32)
Calcium: 9.5 mg/dL (ref 8.9–10.3)
Chloride: 104 mmol/L (ref 101–111)
Creatinine, Ser: 0.87 mg/dL (ref 0.44–1.00)
GFR calc Af Amer: >60 mL/min (ref >60)
GFR calc non Af Amer: >60 mL/min (ref >60)
Glucose, Bld: 122 mg/dL — ABNORMAL HIGH (ref 65–99)
Potassium: 3.4 mmol/L — ABNORMAL LOW (ref 3.5–5.1)
Sodium: 138 mmol/L (ref 135–145)
Total Bilirubin: 0.4 mg/dL (ref 0.3–1.2)
Total Protein: 7.2 g/dL (ref 6.5–8.1)

## 2015-05-22 LAB — CBC
HCT: 40.6 % (ref 36.0–46.0)
Hemoglobin: 13.4 g/dL (ref 12.0–15.0)
MCH: 30.3 pg (ref 26.0–34.0)
MCHC: 33 g/dL (ref 30.0–36.0)
MCV: 91.9 fL (ref 78.0–100.0)
Platelets: 316 10*3/uL (ref 150–400)
RBC: 4.42 MIL/uL (ref 3.87–5.11)
RDW: 12.9 % (ref 11.5–15.5)
WBC: 7.5 10*3/uL (ref 4.0–10.5)

## 2015-05-22 LAB — TROPONIN I
Troponin I: 0.04 ng/mL — ABNORMAL HIGH (ref <0.031)
Troponin I: <0.03 ng/mL (ref <0.031)

## 2015-05-22 LAB — APTT: aPTT: 28 seconds (ref 24–37)

## 2015-05-22 MED ORDER — ASPIRIN 81 MG PO CHEW
243.0000 mg | CHEWABLE_TABLET | Freq: Once | ORAL | Status: AC
  Administered 2015-05-22: 243 mg via ORAL

## 2015-05-22 MED ORDER — ASPIRIN 81 MG PO CHEW
324.0000 mg | CHEWABLE_TABLET | Freq: Once | ORAL | Status: DC
  Filled 2015-05-22: qty 4

## 2015-05-22 NOTE — ED Notes (Signed)
Pt made aware to return if symptoms worsen or if any life threatening symptoms occur.   

## 2015-05-22 NOTE — ED Notes (Signed)
Pt c/o intermittent L. Arm pain x 2 days.

## 2015-05-22 NOTE — Discharge Instructions (Signed)

## 2015-05-22 NOTE — ED Provider Notes (Signed)
CSN: [id number]     Arrival date & time 05/22/15  [id number] History   First MD Initiated Contact with Patient 05/22/15 [id number]     Chief Complaint  Patient presents with  . L arm pain      (Consider location/radiation/quality/duration/timing/severity/associated sxs/prior Treatment) HPI  The patient is a 55 year old female who has a history of hypertension, borderline diabetes and hypercholesterolemia who used to smoke cigarettes but stopped in 2000 and. She states that over the last couple of days she has had some increasing left arm discomfort associated with some neck discomfort and feeling like she is having increased amounts of acid reflux. She has never had arm or neck symptoms associated with acid reflux in the past. She is not necessarily getting exertional symptoms but states that she does not exercise. When the discomfort in her shoulder and neck comes on it lasts for several minutes and then gradually goes away and does not seem to get worse when she moves her arm. She has no numbness or tingling in her arm though she does report having some tingling in her fingers a couple of weeks ago. She denies any swelling in her legs, she has no coughing shortness of breath fevers chills nausea vomiting or diarrhea. She denies any history of cardiac disease but has never been seen by cardiologist according to her report  Past Medical History  Diagnosis Date  . Hypertension   . Thyroid disease   . Back pain   . Migraines   . Vaginal irritation 05/23/2013  . Hematuria 05/23/2013  . BV (bacterial vaginosis) 05/23/2013  . Hyperlipidemia   . Pelvic pain in female 11/02/2013  . Elevated cholesterol 11/02/2013  . Plantar fasciitis of right foot   . Vaginal itching 03/24/2014  . Vaginal discharge 03/24/2014  . Body aches 11/21/2014  . Constipation 11/21/2014   Past Surgical History  Procedure Laterality Date  . Ectopic pregnancy surgery    . Ileocolonoscopy  12/19/2009    [id number] polyps/mild  left-side diverticulosis/hemorrhoids  . Esophagogastroduodenoscopy  12/19/2009    [id number] stricture s/p dilation/mild gastritis   Family History  Problem Relation Age of Onset  . Heart failure Mother   . Hypertension Mother   . Diabetes Mother   . Heart failure Father   . Hypertension Father   . Heart failure Brother   . Hypertension Sister   . Other Sister     blocked artery in neck; knee replacement  . Other Brother     triple bypass surgery  . Hypertension Sister   . Diabetes Sister    Social History  Substance Use Topics  . Smoking status: Former Smoker    Types: Cigarettes  . Smokeless tobacco: Never Used  . Alcohol Use: No   OB History    Gravida Para Term Preterm AB TAB SAB Ectopic Multiple Living   3 3        3      Review of Systems  All other systems reviewed and are negative.     Allergies  Prednisone  Home Medications   Prior to Admission medications   Medication Sig Start Date End Date Taking? Authorizing Provider  amLODipine (NORVASC) 10 MG tablet Take 10 mg by mouth daily. 09/21/14  Yes Historical Provider, MD  Cholecalciferol 2000 units TABS Take 1 daily 03/30/15  Yes Estill Dooms, NP  cyclobenzaprine (FLEXERIL) 5 MG tablet Take 1 tablet (5 mg total) by mouth 3 (three) times daily as needed for muscle spasms. Patient taking differently:  Take 5 mg by mouth as needed for muscle spasms.  02/12/15  Yes Thao P Le, DO  fluticasone (FLONASE) 50 MCG/ACT nasal spray Place 2 sprays into the nose as needed for allergies or rhinitis.    Yes Historical Provider, MD  gabapentin (NEURONTIN) 300 MG capsule Take 1 capsule (300 mg total) by mouth 2 (two) times daily. Patient taking differently: Take 300 mg by mouth at bedtime.  01/16/15  Yes Estill Dooms, NP  ketoprofen (ORUDIS) 50 MG capsule Take 50 mg by mouth 3 (three) times daily.   Yes Historical Provider, MD  levothyroxine (SYNTHROID, LEVOTHROID) 88 MCG tablet Take 1 tablet (88 mcg total) by mouth  every morning. 06/15/14  Yes Estill Dooms, NP  losartan (COZAAR) 100 MG tablet Take 100 mg by mouth daily. 09/21/14  Yes Historical Provider, MD  spironolactone (ALDACTONE) 25 MG tablet Take 0.5 tablets (12.5 mg total) by mouth daily. 01/30/15  Yes Thao P Le, DO   BP 119/58 mmHg  Pulse 70  Temp(Src) 97.8 F (36.6 C) (Oral)  Resp 16  Ht 5\' 7"  (1.702 m)  Wt 252 lb (114.306 kg)  BMI 39.46 kg/m2  SpO2 98% Physical Exam  Constitutional: She appears well-developed and well-nourished. No distress.  HENT:  Head: Normocephalic and atraumatic.  Mouth/Throat: Oropharynx is clear and moist. No oropharyngeal exudate.  Eyes: Conjunctivae and EOM are normal. Pupils are equal, round, and reactive to light. Right eye exhibits no discharge. Left eye exhibits no discharge. No scleral icterus.  Neck: Normal range of motion. Neck supple. No JVD present. No thyromegaly present.  No thyromegaly or lymphadenopathy, supple neck  Cardiovascular: Normal rate, regular rhythm, normal heart sounds and intact distal pulses.  Exam reveals no gallop and no friction rub.   No murmur heard. Pulmonary/Chest: Effort normal and breath sounds normal. No respiratory distress. She has no wheezes. She has no rales. She exhibits no tenderness.  Abdominal: Soft. Bowel sounds are normal. She exhibits no distension and no mass. There is no tenderness.  Musculoskeletal: Normal range of motion. She exhibits no edema or tenderness.  I am unable to make the patient reproduce the pain with movement of the arm, the shoulder, the elbow or palpation around the shoulder girdle, the trapezius or the latissimus dorsi muscle  Lymphadenopathy:    She has no cervical adenopathy.  Neurological: She is alert. Coordination normal.  Normal strength and sensation of the bilateral upper and lower extremities  Skin: Skin is warm and dry. No rash noted. No erythema.  Psychiatric: She has a normal mood and affect. Her behavior is normal.  Nursing  note and vitals reviewed.   ED Course  Procedures (including critical care time) Labs Review Labs Reviewed  COMPREHENSIVE METABOLIC PANEL - Abnormal; Notable for the following:    Potassium 3.4 (*)    Glucose, Bld 122 (*)    All other components within normal limits  TROPONIN I - Abnormal; Notable for the following:    Troponin I 0.04 (*)    All other components within normal limits  APTT  CBC  TROPONIN I    Imaging Review Dg Chest 2 View  05/22/2015  CLINICAL DATA:  Chest heaviness, left upper extremity pain, and shortness of breath for 2 days. EXAM: CHEST  2 VIEW COMPARISON:  11/18/2014 FINDINGS: The heart size and mediastinal contours are within normal limits. Both lungs are clear. The visualized skeletal structures are unremarkable. IMPRESSION: No active cardiopulmonary disease. Electronically Signed   By: Gwyndolyn Saxon  Gerilyn Nestle M.D.   On: 05/22/2015 05:25   I have personally reviewed and evaluated these images and lab results as part of my medical decision-making.   EKG Interpretation   Date/Time:  Tuesday May 22 2015 05:03:21 EDT Ventricular Rate:  67 PR Interval:  181 QRS Duration: 110 QT Interval:  437 QTC Calculation: 461 R Axis:   59 Text Interpretation:  Sinus rhythm Atrial premature complex Nonspecific T  abnrm, anterolateral leads since last tracing no significant change  Confirmed by Tishara Pizano  MD, Llewyn Heap ([id number]) on 05/22/2015 6:15:11 AM      MDM   Final diagnoses:  Pain of left upper extremity    The patient will need an EKG and labs and a chest x-ray and a cardiac workup. I do not think that this is musculoskeletal as I cannot reproduce the pain that seems to be intermittent. She is agreeable to the workup  Second trop is undetectable low Pt now states that she has had multiple stress test - normal and cath - normal in the past. She is lower risk but not no risk for ACS - low risk for PE / diseection and no signs of pna or PTX on xray - stable for d/c Needs  f/u this week with cards Pt is in agreement.  Meds given in ED:  Medications  aspirin chewable tablet 243 mg (243 mg Oral Given 05/22/15 0515)    I have personally viewed and interpreted the imaging and agree with radiologist interpretation.   Noemi Chapel, MD 05/22/15 [phone]

## 2015-05-29 ENCOUNTER — Encounter: Payer: Self-pay | Admitting: Cardiovascular Disease

## 2015-05-29 ENCOUNTER — Ambulatory Visit (INDEPENDENT_AMBULATORY_CARE_PROVIDER_SITE_OTHER): Payer: BLUE CROSS/BLUE SHIELD | Admitting: Cardiovascular Disease

## 2015-05-29 VITALS — BP 116/72 | HR 79 | Ht 67.0 in | Wt 249.0 lb

## 2015-05-29 DIAGNOSIS — R600 Localized edema: Secondary | ICD-10-CM

## 2015-05-29 DIAGNOSIS — M79602 Pain in left arm: Secondary | ICD-10-CM | POA: Diagnosis not present

## 2015-05-29 DIAGNOSIS — R079 Chest pain, unspecified: Secondary | ICD-10-CM | POA: Diagnosis not present

## 2015-05-29 DIAGNOSIS — R778 Other specified abnormalities of plasma proteins: Secondary | ICD-10-CM

## 2015-05-29 DIAGNOSIS — R9431 Abnormal electrocardiogram [ECG] [EKG]: Secondary | ICD-10-CM

## 2015-05-29 DIAGNOSIS — Z9289 Personal history of other medical treatment: Secondary | ICD-10-CM

## 2015-05-29 DIAGNOSIS — R7989 Other specified abnormal findings of blood chemistry: Secondary | ICD-10-CM

## 2015-05-29 DIAGNOSIS — Z87898 Personal history of other specified conditions: Secondary | ICD-10-CM | POA: Diagnosis not present
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
## 2015-05-29 NOTE — Patient Instructions (Signed)
Your physician recommends that you schedule a follow-up appointment in:  6 weeks    Your physician has requested that you have a lexiscan myoview. For further information please visit [email or web address]. Please follow instruction sheet, as given.    Your physician has requested that you have an echocardiogram. Echocardiography is a painless test that uses sound waves to create images of your heart. It provides your doctor with information about the size and shape of your heart and how well your heart's chambers and valves are working. This procedure takes approximately one hour. There are no restrictions for this procedure.     Thank you for choosing [facility] !

## 2015-06-01 DIAGNOSIS — Z1389 Encounter for screening for other disorder: Secondary | ICD-10-CM | POA: Diagnosis not present

## 2015-06-01 DIAGNOSIS — Z6836 Body mass index (BMI) 36.0-36.9, adult: Secondary | ICD-10-CM | POA: Diagnosis not present

## 2015-06-01 DIAGNOSIS — M79604 Pain in right leg: Secondary | ICD-10-CM | POA: Diagnosis not present

## 2015-06-01 DIAGNOSIS — I8391 Asymptomatic varicose veins of right lower extremity: Secondary | ICD-10-CM | POA: Diagnosis not present

## 2015-06-06 ENCOUNTER — Encounter (HOSPITAL_COMMUNITY)
Admission: RE | Admit: 2015-06-06 | Discharge: 2015-06-06 | Disposition: A | Discharge status: Home / Self Care | Payer: BLUE CROSS/BLUE SHIELD | Source: Ambulatory Visit | Attending: Cardiovascular Disease | Admitting: Cardiovascular Disease

## 2015-06-06 ENCOUNTER — Telehealth: Payer: Self-pay

## 2015-06-06 ENCOUNTER — Encounter (HOSPITAL_COMMUNITY): Payer: Self-pay

## 2015-06-06 ENCOUNTER — Other Ambulatory Visit (HOSPITAL_COMMUNITY): Payer: Self-pay | Admitting: Family Medicine

## 2015-06-06 ENCOUNTER — Inpatient Hospital Stay (HOSPITAL_COMMUNITY): Admission: RE | Admit: 2015-06-06 | Payer: BLUE CROSS/BLUE SHIELD | Source: Ambulatory Visit

## 2015-06-06 ENCOUNTER — Ambulatory Visit (HOSPITAL_COMMUNITY)
Admission: RE | Admit: 2015-06-06 | Discharge: 2015-06-06 | Disposition: A | Discharge status: Home / Self Care | Payer: BLUE CROSS/BLUE SHIELD | Source: Ambulatory Visit | Attending: Cardiovascular Disease | Admitting: Cardiovascular Disease

## 2015-06-06 DIAGNOSIS — R600 Localized edema: Secondary | ICD-10-CM | POA: Diagnosis not present

## 2015-06-06 DIAGNOSIS — R079 Chest pain, unspecified: Secondary | ICD-10-CM | POA: Insufficient documentation

## 2015-06-06 DIAGNOSIS — E785 Hyperlipidemia, unspecified: Secondary | ICD-10-CM | POA: Insufficient documentation

## 2015-06-06 DIAGNOSIS — R002 Palpitations: Secondary | ICD-10-CM

## 2015-06-06 DIAGNOSIS — I358 Other nonrheumatic aortic valve disorders: Secondary | ICD-10-CM | POA: Diagnosis not present

## 2015-06-06 DIAGNOSIS — I119 Hypertensive heart disease without heart failure: Secondary | ICD-10-CM | POA: Diagnosis not present

## 2015-06-06 DIAGNOSIS — R931 Abnormal findings on diagnostic imaging of heart and coronary circulation: Secondary | ICD-10-CM | POA: Diagnosis not present

## 2015-06-06 DIAGNOSIS — R42 Dizziness and giddiness: Secondary | ICD-10-CM

## 2015-06-06 LAB — NM MYOCAR MULTI W/SPECT W/WALL MOTION / EF
LV dias vol: 104 mL (ref 46–106)
LV sys vol: 41 mL
Peak HR: 92 {beats}/min
RATE: 0.32
Rest HR: 61 {beats}/min
SDS: 5
SRS: 0
SSS: 5
TID: 1.04

## 2015-06-06 MED ORDER — SODIUM CHLORIDE 0.9% FLUSH
INTRAVENOUS | Status: AC
  Administered 2015-06-06: 10 mL via INTRAVENOUS
  Filled 2015-06-06: qty 10

## 2015-06-06 MED ORDER — TECHNETIUM TC 99M TETROFOSMIN IV KIT
30.0000 | PACK | Freq: Once | INTRAVENOUS | Status: AC | PRN
  Administered 2015-06-06: 31 via INTRAVENOUS

## 2015-06-06 MED ORDER — REGADENOSON 0.4 MG/5ML IV SOLN
INTRAVENOUS | Status: AC
  Administered 2015-06-06: 0.4 mg via INTRAVENOUS
  Filled 2015-06-06: qty 5

## 2015-06-06 MED ORDER — TECHNETIUM TC 99M TETROFOSMIN IV KIT
10.0000 | PACK | Freq: Once | INTRAVENOUS | Status: AC | PRN
  Administered 2015-06-06: 10.5 via INTRAVENOUS

## 2015-06-06 NOTE — Telephone Encounter (Signed)
-----   Message from Herminio Commons, MD sent at 06/06/2015  1:04 PM EDT ----- Normal pumping function.

## 2015-06-06 NOTE — Telephone Encounter (Signed)
Called pt. No answer, lmtcb 

## 2015-07-03 ENCOUNTER — Other Ambulatory Visit: Payer: Self-pay | Admitting: Adult Health

## 2015-07-13 ENCOUNTER — Ambulatory Visit (INDEPENDENT_AMBULATORY_CARE_PROVIDER_SITE_OTHER): Payer: BLUE CROSS/BLUE SHIELD | Admitting: Cardiovascular Disease

## 2015-07-13 ENCOUNTER — Encounter: Payer: Self-pay | Admitting: Cardiovascular Disease

## 2015-07-13 VITALS — BP 150/80 | HR 72 | Ht 68.0 in | Wt 249.0 lb

## 2015-07-13 DIAGNOSIS — R7989 Other specified abnormal findings of blood chemistry: Secondary | ICD-10-CM

## 2015-07-13 DIAGNOSIS — R778 Other specified abnormalities of plasma proteins: Secondary | ICD-10-CM

## 2015-07-13 DIAGNOSIS — I10 Essential (primary) hypertension: Secondary | ICD-10-CM

## 2015-07-13 DIAGNOSIS — R9431 Abnormal electrocardiogram [ECG] [EKG]: Secondary | ICD-10-CM

## 2015-07-13 DIAGNOSIS — Z79899 Other long term (current) drug therapy: Secondary | ICD-10-CM

## 2015-07-13 DIAGNOSIS — M79602 Pain in left arm: Secondary | ICD-10-CM

## 2015-07-13 DIAGNOSIS — R600 Localized edema: Secondary | ICD-10-CM

## 2015-07-13 DIAGNOSIS — I25118 Atherosclerotic heart disease of native coronary artery with other forms of angina pectoris: Secondary | ICD-10-CM

## 2015-07-13 DIAGNOSIS — R079 Chest pain, unspecified: Secondary | ICD-10-CM

## 2015-07-13 MED ORDER — ATORVASTATIN CALCIUM 40 MG PO TABS: 40.0000 mg | ORAL_TABLET | Freq: Every day | ORAL | Status: DC

## 2015-07-13 MED ORDER — SPIRONOLACTONE 25 MG PO TABS: 25.0000 mg | ORAL_TABLET | Freq: Every day | ORAL | Status: DC

## 2015-07-13 NOTE — Progress Notes (Signed)
Patient ID: Becky [name], female   DOB: 12-Jun-1960, 55 y.o.   MRN: [id number]      SUBJECTIVE: The patient returns for follow-up after undergoing cardiovascular testing performed for the evaluation of chest pain and leg swelling.  Nuclear stress testing was low risk overall with a small amount of myocardium in jeopardy. There was a small mild apical infarct with mild peri-infarct ischemia.  Echocardiogram showed normal left ventricular systolic function and regional wall motion, LVEF 0000000, with diastolic dysfunction and mild to moderate LVH as well.  She denies exertional chest pain but occasionally has a left shoulder/upper chest "catching" sensation when lifting her left arm. Her blood pressure remains elevated. She has bilateral ankle swelling and wears compression stockings and has been scheduled to see a vein specialist by her PCP.   Review of Systems: As per "subjective", otherwise negative.  Allergies  Allergen Reactions  . Prednisone Swelling    Current Outpatient Prescriptions  Medication Sig Dispense Refill  . amLODipine (NORVASC) 10 MG tablet Take 10 mg by mouth daily.    . Cholecalciferol 2000 units TABS Take 1 daily 30 each   . cyclobenzaprine (FLEXERIL) 5 MG tablet Take 1 tablet (5 mg total) by mouth 3 (three) times daily as needed for muscle spasms. (Patient taking differently: Take 5 mg by mouth as needed for muscle spasms. ) 30 tablet 0  . fluticasone (FLONASE) 50 MCG/ACT nasal spray Place 2 sprays into the nose as needed for allergies or rhinitis.     Marland Kitchen gabapentin (NEURONTIN) 300 MG capsule Take 1 capsule (300 mg total) by mouth 2 (two) times daily. (Patient taking differently: Take 300 mg by mouth at bedtime. ) 60 capsule 6  . ketoprofen (ORUDIS) 50 MG capsule Take 50 mg by mouth 3 (three) times daily.    Marland Kitchen levothyroxine (SYNTHROID, LEVOTHROID) 88 MCG tablet TAKE ONE TABLET BY MOUTH EVERY MORNING 90 tablet 0  . losartan (COZAAR) 100 MG tablet Take 100 mg by  mouth daily.    Marland Kitchen spironolactone (ALDACTONE) 25 MG tablet Take 0.5 tablets (12.5 mg total) by mouth daily. 30 tablet 0   No current facility-administered medications for this visit.    Past Medical History  Diagnosis Date  . Hypertension   . Thyroid disease   . Back pain   . Migraines   . Vaginal irritation 05/23/2013  . Hematuria 05/23/2013  . BV (bacterial vaginosis) 05/23/2013  . Hyperlipidemia   . Pelvic pain in female 11/02/2013  . Elevated cholesterol 11/02/2013  . Plantar fasciitis of right foot   . Vaginal itching 03/24/2014  . Vaginal discharge 03/24/2014  . Body aches 11/21/2014  . Constipation 11/21/2014    Past Surgical History  Procedure Laterality Date  . Ectopic pregnancy surgery    . Ileocolonoscopy  12/19/2009    [id number] polyps/mild left-side diverticulosis/hemorrhoids  . Esophagogastroduodenoscopy  12/19/2009    [id number] stricture s/p dilation/mild gastritis    Social History   Social History  . Marital Status: Married    Spouse Name: N/A  . Number of Children: N/A  . Years of Education: N/A   Occupational History  . Not on file.   Social History Main Topics  . Smoking status: Former Smoker    Types: Cigarettes    Start date: 01/07/1975    Quit date: 01/06/1998  . Smokeless tobacco: Never Used  . Alcohol Use: No  . Drug Use: No  . Sexual Activity: Yes    Birth Control/ Protection: Post-menopausal   Other Topics  Concern  . Not on file   Social History Narrative     Filed Vitals:   07/13/15 0914  BP: 150/80  Pulse: 72  Height: 5\' 8"  (1.727 m)  Weight: 249 lb (112.946 kg)  SpO2: 96%    PHYSICAL EXAM General: NAD Neck: No JVD, no thyromegaly or thyroid nodule.  Lungs: Clear to auscultation bilaterally with normal respiratory effort. CV: Nondisplaced PMI. Regular rate and rhythm, normal S1/S2, no S3/S4, no murmur. Trivial periankle edema b/l. No carotid bruit.  Abdomen: Soft, obese.  Skin: Intact without lesions  or rashes.  Neurologic: Alert and oriented x 3.  Psych: Normal affect. Extremities: No clubbing or cyanosis.  HEENT: Normal.     ECG: Most recent ECG reviewed.      ASSESSMENT AND PLAN: 1. Chest and left arm discomfort/CAD: Nuclear stress testing was low risk overall with a small amount of myocardium in jeopardy. There was a small mild apical infarct with mild peri-infarct ischemia. Echocardiogram showed normal left ventricular systolic function and regional wall motion, LVEF 0000000, with diastolic dysfunction and mild to moderate LVH as well. Will manage medically. Start ASA 81 mg and Lipitor 40 mg daily.  2. Essential HTN: Elevated. Will increase spironolactone to 25 mg.  3. Bilateral leg swelling: No significant edema today. On spironolactone 12.5 mg daily. Increase to 25 mg for elevated BP. Check BMET within a few days. Echocardiogram showed normal left ventricular systolic function and regional wall motion, LVEF 0000000, with diastolic dysfunction and mild to moderate LVH as well.  Dispo: fu 3 months.   Kate Sable, M.D., F.A.C.C.

## 2015-07-13 NOTE — Patient Instructions (Signed)
Medication Instructions:  START LIPITOR 40 MG DAILY  START ASPIRIN 81 MG DAILY INCREASE SPIRONOLACTONE TO 25 MG DAILY    Labwork: Your physician recommends that you return for lab work in: [place] 5 DAYS  BMET    Testing/Procedures: NONE  Follow-Up: Your physician recommends that you schedule a follow-up appointment in: 3 MONTHS    Any Other Special Instructions Will Be Listed Below (If Applicable).     If you need a refill on your cardiac medications before your next appointment, please call your pharmacy.

## 2015-07-18 ENCOUNTER — Other Ambulatory Visit: Payer: Self-pay | Admitting: *Deleted

## 2015-07-18 DIAGNOSIS — I83891 Varicose veins of right lower extremities with other complications: Secondary | ICD-10-CM

## 2015-07-19 ENCOUNTER — Encounter: Payer: Self-pay | Admitting: Vascular Surgery

## 2015-07-23 ENCOUNTER — Encounter: Payer: Self-pay | Admitting: Vascular Surgery

## 2015-07-23 ENCOUNTER — Ambulatory Visit (INDEPENDENT_AMBULATORY_CARE_PROVIDER_SITE_OTHER): Payer: BLUE CROSS/BLUE SHIELD | Admitting: Vascular Surgery

## 2015-07-23 VITALS — BP 136/83 | HR 91 | Temp 97.8°F | Resp 18 | Ht 67.0 in | Wt 248.0 lb

## 2015-07-23 DIAGNOSIS — I83891 Varicose veins of right lower extremities with other complications: Secondary | ICD-10-CM

## 2015-07-23 NOTE — Progress Notes (Signed)
Subjective:     Patient ID: Becky [name], female   DOB: [birth year], 55 y.o.   MRN: [id number]  HPI this 16 -year-old female was referred by Dr. 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 The patient has not had a formal venous reflux exam-ultrasound. She developed pain in the left posterior thigh about 4 weeks ago. She states that has gradually improved. There was concern about she might having a "blood clot". She has no history of DVT thrombophlebitis stasis ulcers or bleeding. She does have chronic swelling in both legs. She does occasionally wear short leg elastic compression stockings which helps some. She has no symptoms in the contralateral right leg. She does not take anticoagulants.  Past Medical History  Diagnosis Date  . Hypertension   . Thyroid disease   . Back pain   . Migraines   . Vaginal irritation 05/23/2013  . Hematuria 05/23/2013  . BV (bacterial vaginosis) 05/23/2013  . Hyperlipidemia   . Pelvic pain in female 11/02/2013  . Elevated cholesterol 11/02/2013  . Plantar fasciitis of right foot   . Vaginal itching 03/24/2014  . Vaginal discharge 03/24/2014  . Body aches 11/21/2014  . Constipation 11/21/2014    Social History  Substance Use Topics  . Smoking status: Former Smoker    Types: Cigarettes    Start date: 01/07/1975    Quit date: 01/06/1998  . Smokeless tobacco: Never Used  . Alcohol Use: No    Family History  Problem Relation Age of Onset  . Heart failure Mother   . Hypertension Mother   . Diabetes Mother   . Heart failure Father   . Hypertension Father   . Heart failure Brother   . Hypertension Sister   . Other Sister     blocked artery in neck; knee replacement  . Other Brother     triple bypass surgery  . Hypertension Sister   . Diabetes Sister     Allergies  Allergen Reactions  . Prednisone Swelling     Current outpatient prescriptions:  .  amLODipine (NORVASC) 10 MG tablet, Take 10 mg by mouth daily., Disp: ,  Rfl:  .  aspirin 81 MG tablet, Take 81 mg by mouth daily., Disp: , Rfl:  .  atorvastatin (LIPITOR) 40 MG tablet, Take 1 tablet (40 mg total) by mouth daily., Disp: 90 tablet, Rfl: 3 .  Cholecalciferol 2000 units TABS, Take 1 daily, Disp: 30 each, Rfl:  .  cyclobenzaprine (FLEXERIL) 5 MG tablet, Take 1 tablet (5 mg total) by mouth 3 (three) times daily as needed for muscle spasms. (Patient taking differently: Take 5 mg by mouth as needed for muscle spasms. ), Disp: 30 tablet, Rfl: 0 .  fluticasone (FLONASE) 50 MCG/ACT nasal spray, Place 2 sprays into the nose as needed for allergies or rhinitis. , Disp: , Rfl:  .  gabapentin (NEURONTIN) 300 MG capsule, Take 1 capsule (300 mg total) by mouth 2 (two) times daily. (Patient taking differently: Take 300 mg by mouth at bedtime. ), Disp: 60 capsule, Rfl: 6 .  ketoprofen (ORUDIS) 50 MG capsule, Take 50 mg by mouth 3 (three) times daily., Disp: , Rfl:  .  levothyroxine (SYNTHROID, LEVOTHROID) 88 MCG tablet, TAKE ONE TABLET BY MOUTH EVERY MORNING, Disp: 90 tablet, Rfl: 0 .  losartan (COZAAR) 100 MG tablet, Take 100 mg by mouth daily., Disp: , Rfl:  .  spironolactone (ALDACTONE) 25 MG tablet, Take 1 tablet (25 mg total) by mouth daily., Disp: 30 tablet,  Rfl: 0  Filed Vitals:   07/23/15 1535  BP: 136/83  Pulse: 91  Temp: 97.8 F (36.6 C)  Resp: 18  Height: 5\' 7"  (1.702 m)  Weight: 248 lb (112.492 kg)  SpO2: 100%    Body mass index is 38.83 kg/(m^2).           Review of Systems has occasional chest tightness. Has chronic hypertension, hyperlipidemia. Denies dyspnea on exertion, PND, orthopnea, hemoptysis.     Objective:   Physical Exam BP 136/83 mmHg  Pulse 91  Temp(Src) 97.8 F (36.6 C)  Resp 18  Ht 5\' 7"  (1.702 m)  Wt 248 lb (112.492 kg)  BMI 38.83 kg/m2  SpO2 100%    Gen.-alert and oriented x3 in no apparent distress-obese HEENT normal for age Lungs no rhonchi or wheezing Cardiovascular regular rhythm no murmurs carotid  pulses 3+ palpable no bruits audible Abdomen soft nontender no palpable masses Musculoskeletal free of  major deformities Skin clear -no rashes Neurologic normal Lower extremities 3+ femoral and dorsalis pedis pulses palpable bilaterally with 1+ edema bilaterally Left leg with a few spider veins in the medial and lateral thigh area. Nothing palpable or visible in the area of concern in the distal posterior thigh.  Throughout performed a bedside SonoSite-ultrasound exam. The left great saphenous vein is slightly enlarged but no reflux is noted. It is of normal caliber in the distal thigh and calf. The left small saphenous vein is quite small.       Assessment:     Spider veins left thigh with chronic edema bilateral lower extremities-no evidence of severe reflux and superficial venous system bilaterally No venous abnormality noted in posterior thigh where patient's discomfort occurred 4 weeks ago which is now improved.    Plan:     No need for further arterial or venous workup Elevate foot of bed 2-3 inches at night Short leg elastic compression stockings 20-30 millimeter gradient to be placed on first thing in the morning No further recommendations return see me on a when necessary basis

## 2015-07-27 DIAGNOSIS — M5416 Radiculopathy, lumbar region: Secondary | ICD-10-CM | POA: Diagnosis not present

## 2015-08-01 DIAGNOSIS — Z79899 Other long term (current) drug therapy: Secondary | ICD-10-CM | POA: Diagnosis not present

## 2015-08-02 LAB — BASIC METABOLIC PANEL
BUN: 10 mg/dL (ref 7–25)
CO2: 26 mmol/L (ref 20–31)
Calcium: 9.3 mg/dL (ref 8.6–10.4)
Chloride: 106 mmol/L (ref 98–110)
Creat: 0.77 mg/dL (ref 0.50–1.05)
Glucose, Bld: 91 mg/dL (ref 65–99)
Potassium: 4.1 mmol/L (ref 3.5–5.3)
Sodium: 141 mmol/L (ref 135–146)

## 2015-08-15 DIAGNOSIS — G894 Chronic pain syndrome: Secondary | ICD-10-CM | POA: Diagnosis not present

## 2015-08-15 DIAGNOSIS — M5137 Other intervertebral disc degeneration, lumbosacral region: Secondary | ICD-10-CM | POA: Diagnosis not present

## 2015-08-15 DIAGNOSIS — M545 Low back pain: Secondary | ICD-10-CM | POA: Diagnosis not present

## 2015-08-15 DIAGNOSIS — M5117 Intervertebral disc disorders with radiculopathy, lumbosacral region: Secondary | ICD-10-CM | POA: Diagnosis not present

## 2015-08-29 ENCOUNTER — Encounter (HOSPITAL_COMMUNITY): Payer: BLUE CROSS/BLUE SHIELD

## 2015-08-29 DIAGNOSIS — E2749 Other adrenocortical insufficiency: Secondary | ICD-10-CM | POA: Diagnosis not present

## 2015-08-29 DIAGNOSIS — M545 Low back pain: Secondary | ICD-10-CM | POA: Diagnosis not present

## 2015-08-29 DIAGNOSIS — M5137 Other intervertebral disc degeneration, lumbosacral region: Secondary | ICD-10-CM | POA: Diagnosis not present

## 2015-08-29 DIAGNOSIS — E663 Overweight: Secondary | ICD-10-CM | POA: Diagnosis not present

## 2015-08-29 DIAGNOSIS — E038 Other specified hypothyroidism: Secondary | ICD-10-CM | POA: Diagnosis not present

## 2015-08-29 DIAGNOSIS — E2839 Other primary ovarian failure: Secondary | ICD-10-CM | POA: Diagnosis not present

## 2015-08-29 DIAGNOSIS — M79641 Pain in right hand: Secondary | ICD-10-CM | POA: Diagnosis not present

## 2015-08-29 DIAGNOSIS — G5601 Carpal tunnel syndrome, right upper limb: Secondary | ICD-10-CM | POA: Diagnosis not present

## 2015-08-29 DIAGNOSIS — G5603 Carpal tunnel syndrome, bilateral upper limbs: Secondary | ICD-10-CM | POA: Diagnosis not present

## 2015-08-29 DIAGNOSIS — G5602 Carpal tunnel syndrome, left upper limb: Secondary | ICD-10-CM | POA: Diagnosis not present

## 2015-09-05 DIAGNOSIS — E539 Vitamin B deficiency, unspecified: Secondary | ICD-10-CM | POA: Diagnosis not present

## 2015-09-05 DIAGNOSIS — E669 Obesity, unspecified: Secondary | ICD-10-CM | POA: Diagnosis not present

## 2015-09-05 DIAGNOSIS — G894 Chronic pain syndrome: Secondary | ICD-10-CM | POA: Diagnosis not present

## 2015-09-05 DIAGNOSIS — E038 Other specified hypothyroidism: Secondary | ICD-10-CM | POA: Diagnosis not present

## 2015-09-12 ENCOUNTER — Encounter: Payer: Self-pay | Admitting: Adult Health

## 2015-09-12 ENCOUNTER — Ambulatory Visit (INDEPENDENT_AMBULATORY_CARE_PROVIDER_SITE_OTHER): Payer: BLUE CROSS/BLUE SHIELD | Admitting: Adult Health

## 2015-09-12 VITALS — BP 156/80 | HR 86 | Ht 67.0 in | Wt 249.5 lb

## 2015-09-12 DIAGNOSIS — N898 Other specified noninflammatory disorders of vagina: Secondary | ICD-10-CM

## 2015-09-12 DIAGNOSIS — B379 Candidiasis, unspecified: Secondary | ICD-10-CM

## 2015-09-12 DIAGNOSIS — L298 Other pruritus: Secondary | ICD-10-CM | POA: Diagnosis not present

## 2015-09-12 LAB — POCT URINALYSIS DIPSTICK
Blood, UA: NEGATIVE
Glucose, UA: NEGATIVE
Leukocytes, UA: NEGATIVE
Nitrite, UA: NEGATIVE
Protein, UA: NEGATIVE

## 2015-09-12 LAB — POCT WET PREP (WET MOUNT): WBC, Wet Prep HPF POC: POSITIVE

## 2015-09-12 MED ORDER — FLUCONAZOLE 150 MG PO TABS: 150.0000 mg | ORAL_TABLET | Freq: Once | ORAL | 1 refills | Status: AC

## 2015-09-12 MED ORDER — NYSTATIN-TRIAMCINOLONE 100000-0.1 UNIT/GM-% EX CREA: 1.0000 "application " | TOPICAL_CREAM | Freq: Two times a day (BID) | CUTANEOUS | 0 refills | Status: DC

## 2015-09-12 NOTE — Progress Notes (Signed)
Subjective:     Patient ID: Becky [name], female   DOB: [birth year], 55 y.o.   MRN: [id number]  HPI Becky [name] is a 55 year old black female in complaining of vaginal irritation and itching for last 3-4 days, no discharge. She has seen Dr Ace Gins and is on vitamins D,B,C and magnesium and B 12 and may start hormone supplements. She says she feels better. And she is going to work at [employer]   Review of Systems  + vaginal irritation  +vaginal itching  Reviewed past medical,surgical, social and family history. Reviewed medications and allergies.     Objective:   Physical Exam BP (!) 156/80 (BP Location: Left Arm, Patient Position: Sitting, Cuff Size: Large)   Pulse 86   Ht 5\' 7"  (1.702 m)   Wt 249 lb 8 oz (113.2 kg)   BMI 39.08 kg/m urine negative, Skin warm and dry.Pelvic: external genitalia is normal in appearance, has darkened skin in ceases of leg, no lesions, vagina:scant white discharge without odor,urethra has no lesions or masses noted, cervix:smooth, uterus: normal size, shape and contour, non tender, no masses felt, adnexa: no masses or tenderness noted. Bladder is non tender and no masses felt. Wet prep: + for yeast and +WBCs.     Assessment:     1. Vaginal irritation   2. Vaginal itching   3. Yeast infection       Plan:     Rx diflucan 150 mg take 1 now with 1 refill Rx mytrex cream use bid #30 gm Review handout on yeast Follow up prn

## 2015-09-12 NOTE — Patient Instructions (Signed)
Follow up prn  Monilial Vaginitis Vaginitis in a soreness, swelling and redness (inflammation) of the vagina and vulva. Monilial vaginitis is not a sexually transmitted infection. CAUSES  Yeast vaginitis is caused by yeast (candida) that is normally found in your vagina. With a yeast infection, the candida has overgrown in number to a point that upsets the chemical balance. SYMPTOMS   White, thick vaginal discharge.  Swelling, itching, redness and irritation of the vagina and possibly the lips of the vagina (vulva).  Burning or painful urination.  Painful intercourse. DIAGNOSIS  Things that may contribute to monilial vaginitis are:  Postmenopausal and virginal states.  Pregnancy.  Infections.  Being tired, sick or stressed, especially if you had monilial vaginitis in the past.  Diabetes. Good control will help lower the chance.  Birth control pills.  Tight fitting garments.  Using bubble bath, feminine sprays, douches or deodorant tampons.  Taking certain medications that kill germs (antibiotics).  Sporadic recurrence can occur if you become ill. TREATMENT  Your caregiver will give you medication.  There are several kinds of anti monilial vaginal creams and suppositories specific for monilial vaginitis. For recurrent yeast infections, use a suppository or cream in the vagina 2 times a week, or as directed.  Anti-monilial or steroid cream for the itching or irritation of the vulva may also be used. Get your caregiver's permission.  Painting the vagina with methylene blue solution may help if the monilial cream does not work.  Eating yogurt may help prevent monilial vaginitis. HOME CARE INSTRUCTIONS   Finish all medication as prescribed.  Do not have sex until treatment is completed or after your caregiver tells you it is okay.  Take warm sitz baths.  Do not douche.  Do not use tampons, especially scented ones.  Wear cotton underwear.  Avoid tight pants and  panty hose.  Tell your sexual partner that you have a yeast infection. They should go to their caregiver if they have symptoms such as mild rash or itching.  Your sexual partner should be treated as well if your infection is difficult to eliminate.  Practice safer sex. Use condoms.  Some vaginal medications cause latex condoms to fail. Vaginal medications that harm condoms are:  Cleocin cream.  Butoconazole (Femstat).  Terconazole (Terazol) vaginal suppository.  Miconazole (Monistat) (may be purchased over the counter). SEEK MEDICAL CARE IF:   You have a temperature by mouth above 102 F (38.9 C).  The infection is getting worse after 2 days of treatment.  The infection is not getting better after 3 days of treatment.  You develop blisters in or around your vagina.  You develop vaginal bleeding, and it is not your menstrual period.  You have pain when you urinate.  You develop intestinal problems.  You have pain with sexual intercourse.   This information is not intended to replace advice given to you by your health care provider. Make sure you discuss any questions you have with your health care provider.   Document Released: 10/02/2004 Document Revised: 03/17/2011 Document Reviewed: 06/26/2014 Elsevier Interactive Patient Education [employer].

## 2015-09-15 DIAGNOSIS — M7989 Other specified soft tissue disorders: Secondary | ICD-10-CM | POA: Diagnosis not present

## 2015-09-15 DIAGNOSIS — I781 Nevus, non-neoplastic: Secondary | ICD-10-CM | POA: Diagnosis not present

## 2015-09-15 DIAGNOSIS — I878 Other specified disorders of veins: Secondary | ICD-10-CM | POA: Diagnosis not present

## 2015-09-21 DIAGNOSIS — I878 Other specified disorders of veins: Secondary | ICD-10-CM | POA: Diagnosis not present

## 2015-09-28 DIAGNOSIS — J069 Acute upper respiratory infection, unspecified: Secondary | ICD-10-CM | POA: Diagnosis not present

## 2015-09-28 DIAGNOSIS — J209 Acute bronchitis, unspecified: Secondary | ICD-10-CM | POA: Diagnosis not present

## 2015-09-28 DIAGNOSIS — M6080 Other myositis, unspecified site: Secondary | ICD-10-CM | POA: Diagnosis not present

## 2015-09-28 DIAGNOSIS — J0181 Other acute recurrent sinusitis: Secondary | ICD-10-CM | POA: Diagnosis not present

## 2015-09-28 DIAGNOSIS — Z6837 Body mass index (BMI) 37.0-37.9, adult: Secondary | ICD-10-CM | POA: Diagnosis not present

## 2015-09-28 DIAGNOSIS — E782 Mixed hyperlipidemia: Secondary | ICD-10-CM | POA: Diagnosis not present

## 2015-09-28 DIAGNOSIS — Z1389 Encounter for screening for other disorder: Secondary | ICD-10-CM | POA: Diagnosis not present

## 2015-10-10 DIAGNOSIS — M25512 Pain in left shoulder: Secondary | ICD-10-CM | POA: Diagnosis not present

## 2015-10-10 DIAGNOSIS — G5602 Carpal tunnel syndrome, left upper limb: Secondary | ICD-10-CM | POA: Diagnosis not present

## 2015-10-10 DIAGNOSIS — G5601 Carpal tunnel syndrome, right upper limb: Secondary | ICD-10-CM | POA: Diagnosis not present

## 2015-10-17 ENCOUNTER — Ambulatory Visit: Payer: BLUE CROSS/BLUE SHIELD | Admitting: Cardiovascular Disease

## 2015-10-31 ENCOUNTER — Encounter: Payer: Self-pay | Admitting: Physician Assistant

## 2015-10-31 ENCOUNTER — Ambulatory Visit (INDEPENDENT_AMBULATORY_CARE_PROVIDER_SITE_OTHER): Payer: BLUE CROSS/BLUE SHIELD | Admitting: Physician Assistant

## 2015-10-31 VITALS — BP 146/80 | HR 72 | Ht 67.0 in | Wt 249.0 lb

## 2015-10-31 DIAGNOSIS — I10 Essential (primary) hypertension: Secondary | ICD-10-CM | POA: Diagnosis not present

## 2015-10-31 DIAGNOSIS — R252 Cramp and spasm: Secondary | ICD-10-CM | POA: Diagnosis not present

## 2015-10-31 DIAGNOSIS — R600 Localized edema: Secondary | ICD-10-CM

## 2015-10-31 DIAGNOSIS — R079 Chest pain, unspecified: Secondary | ICD-10-CM | POA: Diagnosis not present

## 2015-10-31 MED ORDER — HYDRALAZINE HCL 10 MG PO TABS: 10.0000 mg | ORAL_TABLET | Freq: Three times a day (TID) | ORAL | 3 refills | Status: DC

## 2015-10-31 NOTE — Patient Instructions (Addendum)
Your physician recommends that you schedule a follow-up appointment in: 1 Week for Blood Pressure Check   Your physician recommends that you schedule a follow-up appointment in: 2 Months with Dr. Bronson Ing  Your physician has recommended you make the following change in your medication:  STOP Taking Spironolactone  Start Taking Hydralazine 10 mg Three Times Daily   If you need a refill on your cardiac medications before your next appointment, please call your pharmacy.  Thank you for choosing [facility]!

## 2015-10-31 NOTE — Progress Notes (Signed)
Cardiology Office Note    Date:  10/31/2015   ID:  Becky [name], DOB 1960-02-12, MRN [id number]  PCP:  Glo Herring., MD  Cardiologist: Dr. Bronson Ing  Chief Complaint  Patient presents with  . Follow-up    History of Present Illness:  Frimet Fullilove is a 55 y.o. female  with history of chest pain with nuclear stress testing 05/2015 that was low risk overall with small amount of myocardium in jeopardy with mild apical infarct and mild peri-infarct ischemia. Echo showed normal LV systolic function and wall motion, LVEF 0000000 with diastolic dysfunction and mild to moderate LVH. Dr. Bronson Ing recommended medical management and added aspirin and Lipitor. Blood pressure was elevated and spironolactone was increased.Also has history of leg swelling and was referred to Dr. Kellie Simmering who recommended compression hose and no further workup.  Patient comes in today complaining of severe leg cramps from spironolactone. Sometimes she takes extra potassium that she has home but it doesn't seem to help. She like to change this medicine to see if it makes a difference. She is skipping several doses because of flulike cramps. No complaints of chest pain, palpitations, dyspnea, dyspnea on exertion, dizziness or presyncope.   Past Medical History:  Diagnosis Date  . Back pain   . Body aches 11/21/2014  . BV (bacterial vaginosis) 05/23/2013  . Constipation 11/21/2014  . Elevated cholesterol 11/02/2013  . Hematuria 05/23/2013  . Hyperlipidemia   . Hypertension   . Migraines   . Pelvic pain in female 11/02/2013  . Plantar fasciitis of right foot   . Thyroid disease   . Vaginal discharge 03/24/2014  . Vaginal irritation 05/23/2013  . Vaginal itching 03/24/2014    Past Surgical History:  Procedure Laterality Date  . ECTOPIC PREGNANCY SURGERY    . ESOPHAGOGASTRODUODENOSCOPY  12/19/2009   [id number] stricture s/p dilation/mild gastritis  . ileocolonoscopy  12/19/2009   [id number]  polyps/mild left-side diverticulosis/hemorrhoids  . TUBAL LIGATION      Current Medications: Outpatient Medications Prior to Visit  Medication Sig Dispense Refill  . amLODipine (NORVASC) 10 MG tablet Take 10 mg by mouth daily.    [name] THYROID 30 MG tablet Take 60 mg by mouth daily.   4  . aspirin 81 MG tablet Take 81 mg by mouth daily.    Marland Kitchen atorvastatin (LIPITOR) 40 MG tablet Take 1 tablet (40 mg total) by mouth daily. 90 tablet 3  . cyclobenzaprine (FLEXERIL) 5 MG tablet Take 1 tablet (5 mg total) by mouth 3 (three) times daily as needed for muscle spasms. (Patient taking differently: Take 5 mg by mouth as needed for muscle spasms. ) 30 tablet 0  . esomeprazole (NEXIUM) 40 MG capsule Take 40 mg by mouth daily.    . fluticasone (FLONASE) 50 MCG/ACT nasal spray Place 2 sprays into the nose as needed for allergies or rhinitis.     Marland Kitchen losartan (COZAAR) 100 MG tablet Take 100 mg by mouth daily.    . Magnesium 400 MG CAPS Take 800 mg by mouth at bedtime.    Marland Kitchen spironolactone (ALDACTONE) 25 MG tablet Take 1 tablet (25 mg total) by mouth daily. 30 tablet 0  . gabapentin (NEURONTIN) 300 MG capsule Take 1 capsule (300 mg total) by mouth 2 (two) times daily. (Patient taking differently: Take 300 mg by mouth at bedtime. ) 60 capsule 6  . nystatin-triamcinolone (MYCOLOG II) cream Apply 1 application topically 2 (two) times daily. 30 g 0   No facility-administered medications prior to visit.  Allergies:   Prednisone   Social History   Social History  . Marital status: Married    Spouse name: N/A  . Number of children: N/A  . Years of education: N/A   Social History Main Topics  . Smoking status: Former Smoker    Types: Cigarettes    Start date: 01/07/1975    Quit date: 01/06/1998  . Smokeless tobacco: Never Used  . Alcohol use No  . Drug use: No  . Sexual activity: Yes    Birth control/ protection: Post-menopausal, Surgical     Comment: tubal   Other Topics Concern  . None    Social History Narrative  . None     Family History:  The patient's   family history includes Diabetes in her mother and sister; Heart failure in her brother, father, and mother; Hypertension in her father, mother, sister, and sister; Other in her brother and sister.   ROS:   Please see the history of present illness.    Review of Systems  Constitution: Negative.  HENT: Negative.   Eyes: Negative.   Cardiovascular: Negative.   Respiratory: Negative.   Hematologic/Lymphatic: Negative.   Musculoskeletal: Positive for back pain and muscle cramps. Negative for joint pain.       Leg cramps worse at night  Gastrointestinal: Negative.   Genitourinary: Negative.   Neurological: Negative.    All other systems reviewed and are negative.   PHYSICAL EXAM:   VS:  BP (!) 146/80   Pulse 72   Ht 5\' 7"  (1.702 m)   Wt 249 lb (112.9 kg)   SpO2 96%   BMI 39.00 kg/m   Physical Exam  GEN: Obese, in no acute distress  Neck: no JVD, carotid bruits, or masses Cardiac:RRR; no murmurs, rubs, or gallops  Respiratory:  clear to auscultation bilaterally, normal work of breathing GI: soft, nontender, nondistended, + BS Ext: Trace of edema bilaterally without cyanosis, clubbing. Good distal pulses bilaterally MS: no deformity or atrophy  Skin: warm and dry, no rash Psych: euthymic mood, full affect  Wt Readings from Last 3 Encounters:  10/31/15 249 lb (112.9 kg)  09/12/15 249 lb 8 oz (113.2 kg)  07/23/15 248 lb (112.5 kg)      Studies/Labs Reviewed:   EKG:  EKG is Not ordered today.    Recent Labs: 03/28/2015: TSH 1.840 05/22/2015: ALT 30; Hemoglobin 13.4; Platelets 316 08/01/2015: BUN 10; Creat 0.77; Potassium 4.1; Sodium 141   Lipid Panel    Component Value Date/Time   CHOL 268 (H) 03/28/2015 1548   TRIG 91 03/28/2015 1548   HDL 68 03/28/2015 1548   CHOLHDL 3.9 03/28/2015 1548   CHOLHDL 4.1 11/02/2013 1157   VLDL 17 11/02/2013 1157   LDLCALC 182 (H) 03/28/2015 1548     Additional studies/ records that were reviewed today include:   2-D echo 05/2015 Study Conclusions   - Left ventricle: The cavity size was normal. Wall thickness was   increased increased in a pattern of mild to moderate LVH.   Systolic function was normal. The estimated ejection fraction was   in the range of 55% to 60%. Diastolic function is abnormal,   indeterminate grade. Wall motion was normal; there were no   regional wall motion abnormalities. - Aortic valve: Mildly calcified annulus. Trileaflet; normal   thickness leaflets. Valve area (VTI): 2.92 cm^2. Valve area   (Vmax): 2.95 cm^2. - Left atrium: The atrium was mildly dilated. - Atrial septum: No defect or patent foramen ovale  was identified. - Technically adequate study.  Nuclear stress test 06/06/15  There was no ST segment deviation noted during stress.  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  This is a low risk study. Overall small amount of myocardium at jeopardy.  The left ventricular ejection fraction is normal (55-65%).     ASSESSMENT:    1. Essential hypertension   2. Leg cramps   3. Bilateral edema of lower extremity   4. Chest pain, unspecified type      PLAN:  In order of problems listed above:  Essential hypertension patient's blood pressure is up a little today. She has been skipping her spironolactone because of severe leg cramps. We'll stop spironolactone and try hydralazine 10 mg 3 times a day. She will come back next week for blood pressure check. Follow-up with Dr.Koneswaran in 6 weeks  Leg cramps mostly at night. We'll try stopping spironolactone as she feels this is the culprit. Try hydralazine.  Bilateral edema in the lower extremities usually worse when she is on her feet all day. Stable today. She is wearing compression hose regularly although not today.  Chest pain history with low risk nuclear stress test.    Medication  Adjustments/Labs and Tests Ordered: Current medicines are reviewed at length with the patient today.  Concerns regarding medicines are outlined above.  Medication changes, Labs and Tests ordered today are listed in the Patient Instructions below. Patient Instructions  Your physician recommends that you schedule a follow-up appointment in: 1 Week for Blood Pressure Check   Your physician recommends that you schedule a follow-up appointment in: 2 Months with Dr. Bronson Ing  Your physician has recommended you make the following change in your medication:  STOP Taking Spironolactone  Start Taking Hydralazine 10 mg Three Times Daily   If you need a refill on your cardiac medications before your next appointment, please call your pharmacy.  Thank you for choosing [facility]!           Sumner Boast, PA-C  10/31/2015 12:33 PM    [facility] [address], [place], [place]  [address] Phone: [phone]; Fax: [phone]

## 2015-11-07 ENCOUNTER — Encounter: Payer: Self-pay | Admitting: Physician Assistant

## 2015-11-13 ENCOUNTER — Telehealth: Payer: Self-pay | Admitting: Adult Health

## 2015-11-13 MED ORDER — FLUCONAZOLE 150 MG PO TABS: ORAL_TABLET | ORAL | 1 refills | Status: DC

## 2015-11-13 NOTE — Telephone Encounter (Signed)
Spoke with pt. Pt is having vaginal itching and irritation. No odor. Pt has tried Nystatin cream and Triamcinolone cream with no help. Please advise. Thanks!! [place]

## 2015-11-13 NOTE — Telephone Encounter (Signed)
Will rx diflucan  

## 2015-11-15 DIAGNOSIS — M1990 Unspecified osteoarthritis, unspecified site: Secondary | ICD-10-CM | POA: Diagnosis not present

## 2015-11-15 DIAGNOSIS — Z23 Encounter for immunization: Secondary | ICD-10-CM | POA: Diagnosis not present

## 2015-11-15 DIAGNOSIS — H9203 Otalgia, bilateral: Secondary | ICD-10-CM | POA: Diagnosis not present

## 2015-11-15 DIAGNOSIS — Z6838 Body mass index (BMI) 38.0-38.9, adult: Secondary | ICD-10-CM | POA: Diagnosis not present

## 2015-11-15 DIAGNOSIS — Z1389 Encounter for screening for other disorder: Secondary | ICD-10-CM | POA: Diagnosis not present

## 2015-11-15 DIAGNOSIS — M25551 Pain in right hip: Secondary | ICD-10-CM | POA: Diagnosis not present

## 2015-11-15 DIAGNOSIS — M25562 Pain in left knee: Secondary | ICD-10-CM | POA: Diagnosis not present

## 2015-11-16 ENCOUNTER — Ambulatory Visit (INDEPENDENT_AMBULATORY_CARE_PROVIDER_SITE_OTHER): Payer: BLUE CROSS/BLUE SHIELD | Admitting: Gastroenterology

## 2015-11-16 ENCOUNTER — Other Ambulatory Visit: Payer: Self-pay

## 2015-11-16 ENCOUNTER — Encounter: Payer: Self-pay | Admitting: Gastroenterology

## 2015-11-16 VITALS — BP 144/87 | HR 66 | Temp 97.8°F | Ht 67.0 in | Wt 251.4 lb

## 2015-11-16 DIAGNOSIS — K219 Gastro-esophageal reflux disease without esophagitis: Secondary | ICD-10-CM | POA: Diagnosis not present

## 2015-11-16 DIAGNOSIS — R1310 Dysphagia, unspecified: Secondary | ICD-10-CM | POA: Diagnosis not present

## 2015-11-16 DIAGNOSIS — K5900 Constipation, unspecified: Secondary | ICD-10-CM | POA: Diagnosis not present

## 2015-11-16 NOTE — Patient Instructions (Signed)
We have given you samples of Linzess 145 mcg to start taking for constipation, once each morning 30 minutes before breakfast.   I would like to trial Dexilant samples for you to take once a day instead of Prilosec.   We have set you up for an upper endoscopy with dilation with Dr. Oneida Alar.

## 2015-11-16 NOTE — Progress Notes (Signed)
Primary Care Physician:  Glo Herring., MD Primary Gastroenterologist:  Dr. Oneida Alar   Chief Complaint  Patient presents with  . Gastroesophageal Reflux    burps sour, stopped Nexium (didn't help)  . Constipation    takes a lot of vitamins    HPI:   Becky [name] is a 55 y.o. female presenting today with a history of GERD, constipation. Last colonoscopy 2011 with hyperplastic polyps. EGD at that time with patent peptic stricture s/p dilation, gastritis.   GERD exacerbation for a few months. Was taking Nexium once each day without any improvement. Stopped taking and got Prilosec OTC and taking 40 mg each morning. A bunch of Tums during the day. Gets some nausea. No vomiting. Notes esophageal dysphagia intermittently. Feels like food stops in her throat. Prilosec helped better than Nexium but still with breakthrough reflux.   Notes constipation. Wonders if it is her vitamins. Has lower abdominal discomfort associated with it. Bristol stool scale #1. BM every other day. No rectal bleeding. Takes Miralax.    Past Medical History:  Diagnosis Date  . Back pain   . Body aches 11/21/2014  . BV (bacterial vaginosis) 05/23/2013  . Constipation 11/21/2014  . Elevated cholesterol 11/02/2013  . Hematuria 05/23/2013  . Hyperlipidemia   . Hypertension   . Migraines   . Pelvic pain in female 11/02/2013  . Plantar fasciitis of right foot   . Thyroid disease   . Vaginal discharge 03/24/2014  . Vaginal irritation 05/23/2013  . Vaginal itching 03/24/2014    Past Surgical History:  Procedure Laterality Date  . ECTOPIC PREGNANCY SURGERY    . ESOPHAGOGASTRODUODENOSCOPY  12/19/2009   [id number] stricture s/p dilation/mild gastritis  . ileocolonoscopy  12/19/2009   [id number] polyps/mild left-side diverticulosis/hemorrhoids  . TUBAL LIGATION      Current Outpatient Prescriptions  Medication Sig Dispense Refill  . amLODipine (NORVASC) 10 MG tablet Take 10 mg by mouth daily.    [name] THYROID 30 MG tablet Take 60 mg by mouth daily.   4  . aspirin 81 MG tablet Take 81 mg by mouth daily.    Marland Kitchen atorvastatin (LIPITOR) 40 MG tablet Take 1 tablet (40 mg total) by mouth daily. 90 tablet 3  . Cholecalciferol (VITAMIN D3) 10000 units TABS Take 1 tablet by mouth daily.    . Cyanocobalamin (B-12) 3000 MCG CAPS Take 1 capsule by mouth daily.    . cyclobenzaprine (FLEXERIL) 5 MG tablet Take 1 tablet (5 mg total) by mouth 3 (three) times daily as needed for muscle spasms. (Patient taking differently: Take 5 mg by mouth as needed for muscle spasms. ) 30 tablet 0  . fluticasone (FLONASE) 50 MCG/ACT nasal spray Place 2 sprays into the nose as needed for allergies or rhinitis.     . hydrALAZINE (APRESOLINE) 10 MG tablet Take 1 tablet (10 mg total) by mouth 3 (three) times daily. 270 tablet 3  . losartan (COZAAR) 100 MG tablet Take 100 mg by mouth daily.    . Magnesium 400 MG CAPS Take 800 mg by mouth at bedtime.    . methylPREDNISolone (MEDROL DOSEPAK) 4 MG TBPK tablet Take by mouth daily. Dose pack for 6 days  0  . omeprazole (PRILOSEC OTC) 20 MG tablet Take 40 mg by mouth daily.    Marland Kitchen esomeprazole (NEXIUM) 40 MG capsule Take 40 mg by mouth daily.    . fluconazole (DIFLUCAN) 150 MG tablet Take 1 now and 1 in 3 days (Patient not taking: Reported  on 11/16/2015) 2 tablet 1   No current facility-administered medications for this visit.     Allergies as of 11/16/2015 - Review Complete 11/16/2015  Allergen Reaction Noted  . Prednisone Swelling 01/30/2015    Family History  Problem Relation Age of Onset  . Heart failure Mother   . Hypertension Mother   . Diabetes Mother   . Heart failure Father   . Hypertension Father   . Heart failure Brother   . Hypertension Sister   . Other Sister     blocked artery in neck; knee replacement  . Other Brother     triple bypass surgery  . Hypertension Sister   . Diabetes Sister     Social History   Social History  . Marital status:  Married    Spouse name: N/A  . Number of children: N/A  . Years of education: N/A   Occupational History  . Not on file.   Social History Main Topics  . Smoking status: Former Smoker    Types: Cigarettes    Start date: 01/07/1975    Quit date: 01/06/1998  . Smokeless tobacco: Never Used  . Alcohol use No  . Drug use: No  . Sexual activity: Yes    Birth control/ protection: Post-menopausal, Surgical     Comment: tubal   Other Topics Concern  . Not on file   Social History Narrative  . No narrative on file    Review of Systems: Gen: Denies any fever, chills, fatigue, weight loss, lack of appetite.  CV: +chest discomfort, sees cardiology  Resp: Denies shortness of breath at rest or with exertion. Denies wheezing or cough.  GI: see HPI  GU : Denies urinary burning, urinary frequency, urinary hesitancy MS: Denies joint pain, muscle weakness, cramps, or limitation of movement.  Derm: Denies rash, itching, dry skin Psych: Denies depression, anxiety, memory loss, and confusion Heme: see HPI   Physical Exam: BP (!) 144/87   Pulse 66   Temp 97.8 F (36.6 C) (Oral)   Ht 5\' 7"  (1.702 m)   Wt 251 lb 6.4 oz (114 kg)   BMI 39.37 kg/m  General:   Alert and oriented. Pleasant and cooperative. Well-nourished and well-developed.  Head:  Normocephalic and atraumatic. Eyes:  Without icterus, sclera clear and conjunctiva pink.  Ears:  Normal auditory acuity. Nose:  No deformity, discharge,  or lesions. Mouth:  No deformity or lesions, oral mucosa pink.  Lungs:  Clear to auscultation bilaterally. No wheezes, rales, or rhonchi. No distress.  Heart:  S1, S2 present without murmurs appreciated.  Abdomen:  +BS, soft, non-tender and non-distended. No HSM noted. No guarding or rebound. No masses appreciated.  Rectal:  Deferred  Msk:  Symmetrical without gross deformities. Normal posture. Extremities:  Without edema. Neurologic:  Alert and  oriented x4;  grossly normal  neurologically. Psych:  Alert and cooperative. Normal mood and affect.

## 2015-11-19 NOTE — Assessment & Plan Note (Addendum)
55 year old female with GERD exacerbations over the past few months, taking Prilosec 40 mg daily and Tums throughout the day. Intermittent solid food dysphagia noted, with last EGD in 2011 noting patent peptic stricture s/p dilation and gastritis. With recurrent dysphagia query stricture, uncontrolled GERD, doubt malignancy.   Proceed with upper endoscopy/dilation in the near future with Dr. Oneida Alar. The risks, benefits, and alternatives have been discussed in detail with patient. They have stated understanding and desire to proceed.  Phenergan 25 mg IV on call  Trial of Dexilant samples in interim

## 2015-11-19 NOTE — Assessment & Plan Note (Signed)
Trial Linzess 145 mcg once daily. Last colonoscopy in 2011. No red flags.

## 2015-11-19 NOTE — Assessment & Plan Note (Signed)
Dilation as appropriate.  

## 2015-11-20 NOTE — Progress Notes (Signed)
cc'ed to pcp °

## 2015-11-22 DIAGNOSIS — M1712 Unilateral primary osteoarthritis, left knee: Secondary | ICD-10-CM | POA: Diagnosis not present

## 2015-11-22 DIAGNOSIS — M25551 Pain in right hip: Secondary | ICD-10-CM | POA: Diagnosis not present

## 2015-11-22 DIAGNOSIS — M25562 Pain in left knee: Secondary | ICD-10-CM | POA: Diagnosis not present

## 2015-11-22 DIAGNOSIS — M25552 Pain in left hip: Secondary | ICD-10-CM | POA: Diagnosis not present

## 2015-11-26 ENCOUNTER — Telehealth: Payer: Self-pay | Admitting: Gastroenterology

## 2015-11-26 NOTE — Telephone Encounter (Signed)
Pt was given samples of the Dexilant 60 mg and also Linzess 145 mcg when she was here on 11/16/2015. I have left Vm for her to let me know if she needs both or to please advise.

## 2015-11-26 NOTE — Telephone Encounter (Signed)
PT called back and said she wold like the Dexilant and the Linzess sent to the pharmacy.  Routing to refill box.

## 2015-11-26 NOTE — Telephone Encounter (Signed)
PATIENT CALLED TO SAY THAT THE MEDICATION SAMPLES SHE WAS GIVEN ARE WORKING GREAT AND WOULD LIKE A PRESCRIPTION CALLED INTO [place]   [phone]

## 2015-11-26 NOTE — Telephone Encounter (Signed)
Patient called and would like both prescriptions called into [facility] pharmacy

## 2015-11-26 NOTE — Telephone Encounter (Signed)
See previous phone call today.  

## 2015-11-27 MED ORDER — LINACLOTIDE 145 MCG PO CAPS: 145.0000 ug | ORAL_CAPSULE | Freq: Every day | ORAL | 3 refills | Status: DC

## 2015-11-27 MED ORDER — DEXLANSOPRAZOLE 60 MG PO CPDR: 60.0000 mg | DELAYED_RELEASE_CAPSULE | Freq: Every day | ORAL | 3 refills | Status: DC

## 2015-11-27 NOTE — Addendum Note (Signed)
Addended by: Gordy Levan, ERIC A on: 11/27/2015 08:09 AM   Modules accepted: Orders

## 2015-11-27 NOTE — Telephone Encounter (Signed)
Please notify the patient the Rxs were sent in.

## 2015-11-27 NOTE — Telephone Encounter (Signed)
PT is aware.

## 2015-11-28 ENCOUNTER — Encounter: Payer: Self-pay | Admitting: Adult Health

## 2015-11-28 ENCOUNTER — Ambulatory Visit (INDEPENDENT_AMBULATORY_CARE_PROVIDER_SITE_OTHER): Payer: BLUE CROSS/BLUE SHIELD | Admitting: Adult Health

## 2015-11-28 VITALS — BP 142/80 | HR 74 | Temp 98.3°F | Ht 67.25 in | Wt 251.5 lb

## 2015-11-28 DIAGNOSIS — L298 Other pruritus: Secondary | ICD-10-CM

## 2015-11-28 DIAGNOSIS — R319 Hematuria, unspecified: Secondary | ICD-10-CM

## 2015-11-28 DIAGNOSIS — Z01419 Encounter for gynecological examination (general) (routine) without abnormal findings: Secondary | ICD-10-CM

## 2015-11-28 DIAGNOSIS — Z1211 Encounter for screening for malignant neoplasm of colon: Secondary | ICD-10-CM | POA: Diagnosis not present

## 2015-11-28 DIAGNOSIS — R300 Dysuria: Secondary | ICD-10-CM | POA: Diagnosis not present

## 2015-11-28 DIAGNOSIS — Z01411 Encounter for gynecological examination (general) (routine) with abnormal findings: Secondary | ICD-10-CM

## 2015-11-28 DIAGNOSIS — Z1212 Encounter for screening for malignant neoplasm of rectum: Secondary | ICD-10-CM | POA: Insufficient documentation

## 2015-11-28 DIAGNOSIS — J0110 Acute frontal sinusitis, unspecified: Secondary | ICD-10-CM | POA: Diagnosis not present

## 2015-11-28 DIAGNOSIS — N898 Other specified noninflammatory disorders of vagina: Secondary | ICD-10-CM

## 2015-11-28 LAB — HEMOCCULT GUIAC POC 1CARD (OFFICE): Fecal Occult Blood, POC: NEGATIVE

## 2015-11-28 LAB — POCT URINALYSIS DIPSTICK
Glucose, UA: NEGATIVE
Ketones, UA: NEGATIVE
Leukocytes, UA: NEGATIVE
Nitrite, UA: NEGATIVE
Protein, UA: NEGATIVE

## 2015-11-28 MED ORDER — ESTRADIOL 0.1 MG/GM VA CREA: TOPICAL_CREAM | VAGINAL | 0 refills | Status: DC

## 2015-11-28 MED ORDER — CEPHALEXIN 500 MG PO CAPS: 500.0000 mg | ORAL_CAPSULE | Freq: Three times a day (TID) | ORAL | 0 refills | Status: DC

## 2015-11-28 MED ORDER — FLUCONAZOLE 150 MG PO TABS: ORAL_TABLET | ORAL | 1 refills | Status: DC

## 2015-11-28 NOTE — Progress Notes (Signed)
Patient ID: Becky [name], female   DOB: 1960/07/18, 55 y.o.   MRN: [id number] History of Present Illness: Becky [name] is a 55 year old black female,married in for well woman gyn exam,she had a normal pap with negative HPV 11/21/14.She sees Dr Benjie Karvonen in [place] and has hormone pellets under skin and is getting B12.She complains of burning with urination and vagina/vulva itches and face hurts and has mucous and sore throat.   Current Medications, Allergies, Past Medical History, Past Surgical History, Family History and Social History were reviewed in [employer] record.     Review of Systems:  Patient denies any headaches, hearing loss, fatigue, blurred vision, shortness of breath, chest pain, abdominal pain, problems with bowel movements, or intercourse. No joint pain or mood swings. See HPI for positives.  Physical Exam:BP (!) 142/80 (BP Location: Left Arm, Patient Position: Sitting, Cuff Size: Large)   Pulse 74   Temp 98.3 F (36.8 C)   Ht 5' 7.25" (1.708 m)   Wt 251 lb 8 oz (114.1 kg)   BMI 39.10 kg/m urine 3+ blood General:  Well developed, well nourished, no acute distress Skin:  Warm and dry Neck:  Midline trachea, normal thyroid, good ROM, no lymphadenopathy,throat red, no pustules, has + frontal sinus tenderness, ears clear +pearly gray TM and wax Lungs; Clear to auscultation bilaterally Breast:  No dominant palpable mass, retraction, or nipple discharge Cardiovascular: Regular rate and rhythm Abdomen:  Soft, non tender, no hepatosplenomegaly Pelvic:  External genitalia is normal in appearance, no lesions.  The vagina is normal in appearance. Urethra has no lesions or masses. The cervix is bulbous.  Uterus is felt to be normal size, shape, and contour.  No adnexal masses or tenderness noted.Bladder is non tender, no masses felt. Rectal: Good sphincter tone, no polyps, or hemorrhoids felt.  Hemoccult negative. Extremities/musculoskeletal:  No swelling or  varicosities noted, no clubbing or cyanosis Psych:  No mood changes, alert and cooperative,seems happy PHQ 2 score 0.Will give keflex to cover sinus and ?bladder infection and try estrace cream to see if helps with itching.   Impression: 1. Well woman exam with routine gynecological exam   2. Burning with urination   3. Hematuria, unspecified type   4. Vaginal itching   5. Subacute frontal sinusitis       Plan: Meds ordered this encounter  Medications  . ibuprofen (ADVIL,MOTRIN) 200 MG tablet    Sig: Take 800 mg by mouth as needed.  Marland Kitchen acetaminophen (TYLENOL) 500 MG tablet    Sig: Take 500 mg by mouth as needed.  Marland Kitchen estradiol (ESTRACE VAGINAL) 0.1 MG/GM vaginal cream    Sig: Use 1 gm 2-3 x a week    Dispense:  36 g    Refill:  0    Order Specific Question:   Supervising Provider    Answer:   EURE, LUTHER H [[id number]]  . cephALEXin (KEFLEX) 500 MG capsule    Sig: Take 1 capsule (500 mg total) by mouth 3 (three) times daily.    Dispense:  21 capsule    Refill:  0    Order Specific Question:   Supervising Provider    Answer:   Elonda Husky, LUTHER H [[id number]]  . fluconazole (DIFLUCAN) 150 MG tablet    Sig: Take 1 now and 1 in 3 days    Dispense:  2 tablet    Refill:  1    Order Specific Question:   Supervising Provider    Answer:   Elonda Husky,  LUTHER H [[id number]]   UA C&S sent  Push fluids Physical in 1 year pap in 2019 Mammogram now and yearly Colonoscopy per GI

## 2015-11-28 NOTE — Patient Instructions (Signed)
Push fluids Physical in 1 year pap in 2019 Mammogram now and yearly Colonoscopy per GI

## 2015-11-29 LAB — URINALYSIS, ROUTINE W REFLEX MICROSCOPIC
Bilirubin, UA: NEGATIVE
Glucose, UA: NEGATIVE
Ketones, UA: NEGATIVE
Leukocytes, UA: NEGATIVE
Nitrite, UA: NEGATIVE
Protein, UA: NEGATIVE
Specific Gravity, UA: 1.023 (ref 1.005–1.030)
Urobilinogen, Ur: 0.2 mg/dL (ref 0.2–1.0)
pH, UA: 6 (ref 5.0–7.5)

## 2015-11-29 LAB — MICROSCOPIC EXAMINATION: Casts: NONE SEEN /lpf

## 2015-11-30 LAB — URINE CULTURE

## 2015-12-07 ENCOUNTER — Encounter (HOSPITAL_COMMUNITY): Payer: Self-pay | Admitting: Anesthesiology

## 2015-12-10 ENCOUNTER — Ambulatory Visit (HOSPITAL_COMMUNITY): Admit: 2015-12-10 | Payer: BLUE CROSS/BLUE SHIELD | Admitting: Internal Medicine

## 2015-12-10 ENCOUNTER — Encounter (HOSPITAL_COMMUNITY): Payer: Self-pay | Admitting: *Deleted

## 2015-12-10 ENCOUNTER — Encounter (HOSPITAL_COMMUNITY): Payer: Self-pay

## 2015-12-10 ENCOUNTER — Ambulatory Visit (HOSPITAL_COMMUNITY)
Admission: RE | Admit: 2015-12-10 | Discharge: 2015-12-10 | Disposition: A | Discharge status: Home / Self Care | Payer: BLUE CROSS/BLUE SHIELD | Source: Ambulatory Visit | Attending: Gastroenterology | Admitting: Gastroenterology

## 2015-12-10 ENCOUNTER — Encounter (HOSPITAL_COMMUNITY)
Admission: RE | Disposition: A | Discharge status: Home / Self Care | Payer: Self-pay | Source: Ambulatory Visit | Attending: Gastroenterology

## 2015-12-10 DIAGNOSIS — Z79899 Other long term (current) drug therapy: Secondary | ICD-10-CM | POA: Diagnosis not present

## 2015-12-10 DIAGNOSIS — K319 Disease of stomach and duodenum, unspecified: Secondary | ICD-10-CM | POA: Insufficient documentation

## 2015-12-10 DIAGNOSIS — R1310 Dysphagia, unspecified: Secondary | ICD-10-CM

## 2015-12-10 DIAGNOSIS — Z87891 Personal history of nicotine dependence: Secondary | ICD-10-CM | POA: Diagnosis not present

## 2015-12-10 DIAGNOSIS — Z7982 Long term (current) use of aspirin: Secondary | ICD-10-CM | POA: Diagnosis not present

## 2015-12-10 DIAGNOSIS — K219 Gastro-esophageal reflux disease without esophagitis: Secondary | ICD-10-CM | POA: Insufficient documentation

## 2015-12-10 DIAGNOSIS — Z79890 Hormone replacement therapy (postmenopausal): Secondary | ICD-10-CM | POA: Insufficient documentation

## 2015-12-10 DIAGNOSIS — R1013 Epigastric pain: Secondary | ICD-10-CM | POA: Diagnosis not present

## 2015-12-10 DIAGNOSIS — K317 Polyp of stomach and duodenum: Secondary | ICD-10-CM | POA: Diagnosis not present

## 2015-12-10 DIAGNOSIS — K3189 Other diseases of stomach and duodenum: Secondary | ICD-10-CM | POA: Diagnosis not present

## 2015-12-10 HISTORY — PX: ESOPHAGOGASTRODUODENOSCOPY: SHX5428

## 2015-12-10 SURGERY — EGD (ESOPHAGOGASTRODUODENOSCOPY)
Anesthesia: Moderate Sedation

## 2015-12-10 MED ORDER — LIDOCAINE VISCOUS 2 % MT SOLN
OROMUCOSAL | Status: DC | PRN
  Administered 2015-12-10: 1 via OROMUCOSAL

## 2015-12-10 MED ORDER — PROMETHAZINE HCL 25 MG/ML IJ SOLN
25.0000 mg | Freq: Once | INTRAMUSCULAR | Status: AC
  Administered 2015-12-10: 25 mg via INTRAVENOUS

## 2015-12-10 MED ORDER — PROMETHAZINE HCL 25 MG/ML IJ SOLN
INTRAMUSCULAR | Status: AC
  Filled 2015-12-10: qty 1

## 2015-12-10 MED ORDER — MEPERIDINE HCL 100 MG/ML IJ SOLN
INTRAMUSCULAR | Status: DC | PRN
  Administered 2015-12-10 (×2): 25 mg via INTRAVENOUS

## 2015-12-10 MED ORDER — LIDOCAINE VISCOUS 2 % MT SOLN
OROMUCOSAL | Status: AC
  Filled 2015-12-10: qty 15

## 2015-12-10 MED ORDER — MIDAZOLAM HCL 5 MG/5ML IJ SOLN
INTRAMUSCULAR | Status: AC
  Filled 2015-12-10: qty 10

## 2015-12-10 MED ORDER — MINERAL OIL PO OIL
TOPICAL_OIL | ORAL | Status: AC
  Filled 2015-12-10: qty 30

## 2015-12-10 MED ORDER — MEPERIDINE HCL 100 MG/ML IJ SOLN
INTRAMUSCULAR | Status: AC
  Filled 2015-12-10: qty 2

## 2015-12-10 MED ORDER — SODIUM CHLORIDE 0.9 % IV SOLN
INTRAVENOUS | Status: DC
  Administered 2015-12-10: 1000 mL via INTRAVENOUS

## 2015-12-10 MED ORDER — MIDAZOLAM HCL 5 MG/5ML IJ SOLN
INTRAMUSCULAR | Status: DC | PRN
  Administered 2015-12-10: 2 mg via INTRAVENOUS
  Administered 2015-12-10: 1 mg via INTRAVENOUS
  Administered 2015-12-10: 2 mg via INTRAVENOUS

## 2015-12-10 MED ORDER — SODIUM CHLORIDE 0.9% FLUSH
INTRAVENOUS | Status: AC
  Administered 2015-12-10: 10 mL
  Filled 2015-12-10: qty 10

## 2015-12-10 MED ORDER — STERILE WATER FOR IRRIGATION IR SOLN
Status: DC | PRN
  Administered 2015-12-10: 09:00:00

## 2015-12-10 NOTE — Discharge Instructions (Signed)
You have mild gastritis AND A FEW SMALL POLYPS. I BIOPSIES YOUR STOMACH.   CONTINUE YOUR WEIGHT LOSS EFFORTS. YOU SHOULD LOSE 20 LBS.  WHILE I DO NOT WANT TO ALARM YOU, YOUR BODY MASS INDEX IS OVER 30 WHICH MEANS YOU ARE OBESE. OBESITY DRIVES CANCER GENES AND IS ASSOCIATED WITH AN INCREASE RISK FOR ALL CANCERS, INCLUDING ESOPHAGEAL AND COLON CANCER.  FOLLOW A LOW FAT DIET. MEATS SHOULD BE BAKED, BROILED, OR BOILED. AVOID FRIED FOODS. SEE INFO BELOW.  CONTINUE DEXILANT  OPEN LINZESS CAPSULE. PLACE GRANULES IN 4 TEASPOONS OF WATER. STIR IT FOR 30 SECONDS. TAKE 3 TSP OF THE WATER DAILY . THIS SHOULD PREVENT WATERY STOOLS OR EXPLOSIVE DIARRHEA.   FOLLOW UP IN 4 MOS.  UPPER ENDOSCOPY AFTER CARE Read the instructions outlined below and refer to this sheet in the next week. These discharge instructions provide you with general information on caring for yourself after you leave the hospital. While your treatment has been planned according to the most current medical practices available, unavoidable complications occasionally occur. If you have any problems or questions after discharge, call DR. Fransheska Willingham, [phone].  ACTIVITY  You may resume your regular activity, but move at a slower pace for the next 24 hours.   Take frequent rest periods for the next 24 hours.   Walking will help get rid of the air and reduce the bloated feeling in your belly (abdomen).   No driving for 24 hours (because of the medicine (anesthesia) used during the test).   You may shower.   Do not sign any important legal documents or operate any machinery for 24 hours (because of the anesthesia used during the test).    NUTRITION  Drink plenty of fluids.   You may resume your normal diet as instructed by your doctor.   Begin with a light meal and progress to your normal diet. Heavy or fried foods are harder to digest and may make you feel sick to your stomach (nauseated).   Avoid alcoholic beverages for 24 hours  or as instructed.    MEDICATIONS  You may resume your normal medications.   WHAT YOU CAN EXPECT TODAY  Some feelings of bloating in the abdomen.   Passage of more gas than usual.    IF YOU HAD A BIOPSY TAKEN DURING THE UPPER ENDOSCOPY:  Eat a soft diet IF YOU HAVE NAUSEA, BLOATING, ABDOMINAL PAIN, OR VOMITING.    FINDING OUT THE RESULTS OF YOUR TEST Not all test results are available during your visit. DR. Oneida Alar WILL CALL YOU WITHIN 14 DAYS OF YOUR PROCEDUE WITH YOUR RESULTS. Do not assume everything is normal if you have not heard from DR. Izola Teague, CALL HER OFFICE AT [phone].  SEEK IMMEDIATE MEDICAL ATTENTION AND CALL THE OFFICE: [phone] IF:  You have more than a spotting of blood in your stool.   Your belly is swollen (abdominal distention).   You are nauseated or vomiting.   You have a temperature over 101F.   You have abdominal pain or discomfort that is severe or gets worse throughout the day.   Gastritis  Gastritis is an inflammation (the body's way of reacting to injury and/or infection) of the stomach. It is often caused by viral or bacterial (germ) infections. It can also be caused BY ASPIRIN, BC/GOODY POWDER'S, (IBUPROFEN) MOTRIN, OR ALEVE (NAPROXEN), chemicals (including alcohol), SPICY FOODS, and medications. This illness may be associated with generalized malaise (feeling tired, not well), UPPER ABDOMINAL STOMACH cramps, and fever. One common  bacterial cause of gastritis is an organism known as H. Pylori. This can be treated with antibiotics.   Lifestyle and home remedies TO HELP CONTROL HEARTBURN.  You may eliminate or reduce the frequency of heartburn by making the following lifestyle changes:   Control your weight. Being overweight is a major risk factor for heartburn and GERD. Excess pounds put pressure on your abdomen, pushing up your stomach and causing acid to back up into your esophagus.    Eat smaller meals. 4 TO 6 MEALS A DAY. This  reduces pressure on the lower esophageal sphincter, helping to prevent the valve from opening and acid from washing back into your esophagus.    Loosen your belt. Clothes that fit tightly around your waist put pressure on your abdomen and the lower esophageal sphincter.     Eliminate heartburn triggers. Everyone has specific triggers.Common triggers such as fatty or fried foods, spicy food, tomato sauce, carbonated beverages, alcohol, chocolate, mint, garlic, onion, caffeine and nicotine may make heartburn worse.    Avoid stooping or bending. Tying your shoes is OK. Bending over for longer periods to weed your garden isn't, especially soon after eating.    Don't lie down after a meal. Wait at least three to four hours after eating before going to bed, and don't lie down right after eating.    PLACE THE HEAD OF YOUR BED ON 6 INCH BLOCKS.  Alternative medicine  Several home remedies exist for treating GERD, but they provide only temporary relief. They include drinking baking soda (sodium bicarbonate) added to water or drinking other fluids such as baking soda mixed with cream of tartar and water.  Although these liquids create temporary relief by neutralizing, washing away or buffering acids, eventually they aggravate the situation by adding gas and fluid to your stomach, increasing pressure and causing more acid reflux. Further, adding more sodium to your diet may increase your blood pressure and add stress to your heart, and excessive bicarbonate ingestion can alter the acid-base balance in your body.   Low-Fat Diet  BREADS, CEREALS, PASTA, RICE, DRIED PEAS, AND BEANS These products are high in carbohydrates and most are low in fat. Therefore, they can be increased in the diet as substitutes for fatty foods. They too, however, contain calories and should not be eaten in excess. Cereals can be eaten for snacks as well as for breakfast.  Include foods that contain fiber (fruits,  vegetables, whole grains, and legumes). Research shows that fiber may lower blood cholesterol levels, especially the water-soluble fiber found in fruits, vegetables, oat products, and legumes.  FRUITS AND VEGETABLES It is good to eat fruits and vegetables. Besides being sources of fiber, both are rich in vitamins and some minerals. They help you get the daily allowances of these nutrients. Fruits and vegetables can be used for snacks and desserts.  MEATS Limit lean meat, chicken, [place], and fish to no more than 6 ounces per day.  Beef, Pork, and Lamb Use lean cuts of beef, pork, and lamb. Lean cuts include:  Extra-lean ground beef.  Arm roast.  Sirloin tip.  Center-cut ham.  Round steak.  Loin chops.  Rump roast.  Tenderloin.  Trim all fat off the outside of meats before cooking. It is not necessary to severely decrease the intake of red meat, but lean choices should be made. Lean meat is rich in protein and contains a highly absorbable form of iron. Premenopausal women, in particular, should avoid reducing lean red meat because this  could increase the risk for low red blood cells (iron-deficiency anemia).  Chicken and [place] These are good sources of protein. The fat of poultry can be reduced by removing the skin and underlying fat layers before cooking. Chicken and [place] can be substituted for lean red meat in the diet. Poultry should not be fried or covered with high-fat sauces.  Fish and Shellfish Fish is a good source of protein. Shellfish contain cholesterol, but they usually are low in saturated fatty acids. The preparation of fish is important. Like chicken and [place], they should not be fried or covered with high-fat sauces.  EGGS Egg whites contain no fat or cholesterol. They can be eaten often. Try 1 to 2 egg whites instead of whole eggs in recipes or use egg substitutes that do not contain yolk.  MILK AND DAIRY PRODUCTS Use skim or 1% milk instead of 2% or whole milk.  Decrease whole milk, natural, and processed cheeses. Use nonfat or low-fat (2%) cottage cheese or low-fat cheeses made from vegetable oils. Choose nonfat or low-fat (1 to 2%) yogurt. Experiment with evaporated skim milk in recipes that call for heavy cream. Substitute low-fat yogurt or low-fat cottage cheese for sour cream in dips and salad dressings. Have at least 2 servings of low-fat dairy products, such as 2 glasses of skim (or 1%) milk each day to help get your daily calcium intake.  FATS AND OILS Reduce the total intake of fats, especially saturated fat. Butterfat, lard, and beef fats are high in saturated fat and cholesterol. These should be avoided as much as possible. Vegetable fats do not contain cholesterol, but certain vegetable fats, such as coconut oil, palm oil, and palm kernel oil are very high in saturated fats. These should be limited. These fats are often used in bakery goods, processed foods, popcorn, oils, and nondairy creamers. Vegetable shortenings and some peanut butters contain hydrogenated oils, which are also saturated fats. Read the labels on these foods and check for saturated vegetable oils.  Unsaturated vegetable oils and fats do not raise blood cholesterol. However, they should be limited because they are fats and are high in calories. Total fat should still be limited to 30% of your daily caloric intake. Desirable liquid vegetable oils are corn oil, cottonseed oil, olive oil, canola oil, safflower oil, soybean oil, and sunflower oil. Peanut oil is not as good, but small amounts are acceptable. Buy a heart-healthy tub margarine that has no partially hydrogenated oils in the ingredients. Mayonnaise and salad dressings often are made from unsaturated fats, but they should also be limited because of their high calorie and fat content. Seeds, nuts, peanut butter, olives, and avocados are high in fat, but the fat is mainly the unsaturated type. These foods should be limited mainly to  avoid excess calories and fat.  OTHER EATING TIPS Snacks  Most sweets should be limited as snacks. They tend to be rich in calories and fats, and their caloric content outweighs their nutritional value. Some good choices in snacks are graham crackers, melba toast, soda crackers, bagels (no egg), English muffins, fruits, and vegetables. These snacks are preferable to snack crackers, [place] fries, and chips. Popcorn should be air-popped or cooked in small amounts of liquid vegetable oil.  Desserts Eat fruit, low-fat yogurt, and fruit ices instead of pastries, cake, and cookies. Sherbet, angel food cake, gelatin dessert, frozen low-fat yogurt, or other frozen products that do not contain saturated fat (pure fruit juice bars, frozen ice pops) are also acceptable.  COOKING METHODS Choose those methods that use little or no fat. They include: Poaching.  Braising.  Steaming.  Grilling.  Baking.  Stir-frying.  Broiling.  Microwaving.  Foods can be cooked in a nonstick pan without added fat, or use a nonfat cooking spray in regular cookware. Limit fried foods and avoid frying in saturated fat. Add moisture to lean meats by using water, broth, cooking wines, and other nonfat or low-fat sauces along with the cooking methods mentioned above. Soups and stews should be chilled after cooking. The fat that forms on top after a few hours in the refrigerator should be skimmed off. When preparing meals, avoid using excess salt. Salt can contribute to raising blood pressure in some people.  EATING AWAY FROM HOME Order entres, potatoes, and vegetables without sauces or butter. When meat exceeds the size of a deck of cards (3 to 4 ounces), the rest can be taken home for another meal. Choose vegetable or fruit salads and ask for low-calorie salad dressings to be served on the side. Use dressings sparingly. Limit high-fat toppings, such as bacon, crumbled eggs, cheese, sunflower seeds, and olives. Ask for  heart-healthy tub margarine instead of butter.

## 2015-12-10 NOTE — Op Note (Signed)
[facility] Patient Name: Becky [name] Procedure Date: 12/10/2015 8:46 AM MRN: [id number] Date of Birth: Oct 14, 1960 Attending MD: Barney Drain , MD CSN: [id number] Age: 55 Admit Type: Outpatient Procedure:                Upper GI endoscopy WITH COLD FORCES BIOPSY Indications:              Dyspepsia, Dysphagia. DEXILANT CONTROLS SYMPTOMS.                            LINZESS CAUSES LOOSE STOOLS. WEIGHT UP 4 LBS SINCE                            2011-BMI 39/ Providers:                Barney Drain, MD, Janeece Riggers, RN, Randa Spike,                            Technician Referring MD:             Redmond School, MD Medicines:                Promethazine 25 mg IV, Meperidine 50 mg IV,                            Midazolam 5 mg IV Complications:            No immediate complications. Estimated Blood Loss:     Estimated blood loss was minimal. Procedure:                Pre-Anesthesia Assessment:                           - Prior to the procedure, a History and Physical                            was performed, and patient medications and                            allergies were reviewed. The patient's tolerance of                            previous anesthesia was also reviewed. The risks                            and benefits of the procedure and the sedation                            options and risks were discussed with the patient.                            All questions were answered, and informed consent                            was obtained. Prior Anticoagulants: The patient has  taken aspirin, last dose was 1 day prior to                            procedure. ASA Grade Assessment: II - A patient                            with mild systemic disease. After reviewing the                            risks and benefits, the patient was deemed in                            satisfactory condition to undergo the procedure.   After obtaining informed consent, the endoscope was                            passed under direct vision. Throughout the                            procedure, the patient's blood pressure, pulse, and                            oxygen saturations were monitored continuously. The                            [id number]) scope was introduced through the                            mouth, and advanced to the second part of duodenum.                            The upper GI endoscopy was accomplished without                            difficulty. The patient tolerated the procedure                            well. Scope In: 9:03:12 AM Scope Out: 9:10:31 AM Total Procedure Duration: 0 hours 7 minutes 19 seconds  Findings:      The examined esophagus was normal.      A few small sessile polyps were found in the gastric body. Biopsies were       taken with a cold forceps for histology.      Patchy mild inflammation characterized by congestion (edema) and       erythema was found in the gastric antrum. Biopsies were taken with a       cold forceps for Helicobacter pylori testing.      The examined duodenum was normal. Impression:               - DYSPHAGIA DUE TO UNCONTROLLED REFLUX.                           - A few gastric polyps.                           -  MILD Gastritis. Moderate Sedation:      Moderate (conscious) sedation was administered by the endoscopy nurse       and supervised by the endoscopist. The following parameters were       monitored: oxygen saturation, heart rate, blood pressure, and response       to care. Total physician intraservice time was 17 minutes. Recommendation:           - High fiber diet and low fat diet.                           - Continue present medications.                           - Await pathology results.                           - Return to my office in 4 months.                           - Patient has a contact number available for                             emergencies. The signs and symptoms of potential                            delayed complications were discussed with the                            patient. Return to normal activities tomorrow.                            Written discharge instructions were provided to the                            patient. Procedure Code(s):        --- Professional ---                           [phone], Esophagogastroduodenoscopy, flexible,                            transoral; with biopsy, single or multiple                           [id number], Moderate sedation services provided by the                            same physician or other qualified health care                            professional performing the diagnostic or                            therapeutic service that the sedation supports,  requiring the presence of an independent trained                            observer to assist in the monitoring of the                            patient's level of consciousness and physiological                            status; initial 15 minutes of intraservice time,                            patient age 27 years or older Diagnosis Code(s):        --- Professional ---                           K31.7, Polyp of stomach and duodenum                           K29.70, Gastritis, unspecified, without bleeding                           R10.13, Epigastric pain                           R13.10, Dysphagia, unspecified CPT copyright 2016 American Medical Association. All rights reserved. The codes documented in this report are preliminary and upon coder review may  be revised to meet current compliance requirements. Barney Drain, MD Barney Drain, MD 12/10/2015 9:19:10 AM This report has been signed electronically. Number of Addenda: 0

## 2015-12-10 NOTE — H&P (Addendum)
Primary Care Physician:  Glo Herring., MD Primary Gastroenterologist:  Dr. Oneida Alar  Pre-Procedure History & Physical: HPI:  Becky [name] is a 55 y.o. female here for DYSPHAGIA/DYSPEPSIA. DEXILANT RESOLVED SYMPTOMS. LINZESS CAUSES LOOSE STOOLS.  Past Medical History:  Diagnosis Date  . Back pain   . Body aches 11/21/2014  . BV (bacterial vaginosis) 05/23/2013  . Constipation 11/21/2014  . Elevated cholesterol 11/02/2013  . Hematuria 05/23/2013  . Hyperlipidemia   . Hypertension   . Migraines   . Pelvic pain in female 11/02/2013  . Plantar fasciitis of right foot   . Thyroid disease   . Vaginal discharge 03/24/2014  . Vaginal irritation 05/23/2013  . Vaginal itching 03/24/2014    Past Surgical History:  Procedure Laterality Date  . ECTOPIC PREGNANCY SURGERY    . ESOPHAGOGASTRODUODENOSCOPY  12/19/2009   [id number] stricture s/p dilation/mild gastritis  . ileocolonoscopy  12/19/2009   [id number] polyps/mild left-side diverticulosis/hemorrhoids  . KNEE SURGERY    . TUBAL LIGATION      Prior to Admission medications   Medication Sig Start Date End Date Taking? Authorizing Provider  amLODipine (NORVASC) 10 MG tablet Take 10 mg by mouth daily. 09/21/14  Yes Historical Provider, MD  ARMOUR THYROID 30 MG tablet Take 60 mg by mouth daily.  09/04/15  Yes Historical Provider, MD  aspirin 81 MG tablet Take 81 mg by mouth daily.   Yes Historical Provider, MD  atorvastatin (LIPITOR) 40 MG tablet Take 1 tablet (40 mg total) by mouth daily. Patient taking differently: Take 40 mg by mouth every evening.  07/13/15  Yes Herminio Commons, MD  Cholecalciferol (VITAMIN D3) 10000 units TABS Take 10,000 tablets by mouth daily.    Yes Historical Provider, MD  Cyanocobalamin (B-12) 3000 MCG CAPS Take 1 capsule by mouth daily.   Yes Historical Provider, MD  dexlansoprazole (DEXILANT) 60 MG capsule Take 1 capsule (60 mg total) by mouth daily. 11/27/15  Yes Carlis Stable, NP  fluticasone  (FLONASE) 50 MCG/ACT nasal spray Place 2 sprays into the nose as needed for allergies or rhinitis.    Yes Historical Provider, MD  Glycerin-Polysorbate 80 (REFRESH DRY EYE THERAPY OP) Apply 1 drop to eye daily as needed (dry eyes).   Yes Historical Provider, MD  hydrALAZINE (APRESOLINE) 10 MG tablet Take 1 tablet (10 mg total) by mouth 3 (three) times daily. 10/31/15 01/29/16 Yes Imogene Burn, PA-C  ibuprofen (ADVIL,MOTRIN) 200 MG tablet Take 800 mg by mouth as needed for headache or moderate pain.    Yes Historical Provider, MD  linaclotide Rolan Lipa) 145 MCG CAPS capsule Take 1 capsule (145 mcg total) by mouth daily before breakfast. 11/27/15  Yes Carlis Stable, NP  losartan (COZAAR) 100 MG tablet Take 100 mg by mouth daily. 09/21/14  Yes Historical Provider, MD  Magnesium 400 MG CAPS Take 800 mg by mouth as needed (cramps).    Yes Historical Provider, MD  omeprazole (PRILOSEC) 40 MG capsule Take 40 mg by mouth daily.   Yes Historical Provider, MD  acetaminophen (TYLENOL) 500 MG tablet Take 500 mg by mouth as needed for moderate pain or headache.     Historical Provider, MD  cephALEXin (KEFLEX) 500 MG capsule Take 1 capsule (500 mg total) by mouth 3 (three) times daily. 11/28/15   Estill Dooms, NP  cyclobenzaprine (FLEXERIL) 5 MG tablet Take 1 tablet (5 mg total) by mouth 3 (three) times daily as needed for muscle spasms. Patient taking differently: Take 5 mg by mouth as  needed for muscle spasms.  02/12/15   Thao P Le, DO  estradiol (ESTRACE VAGINAL) 0.1 MG/GM vaginal cream Use 1 gm 2-3 x a week Patient not taking: Reported on 12/05/2015 11/28/15   Estill Dooms, NP  fluconazole (DIFLUCAN) 150 MG tablet Take 1 now and 1 in 3 days Patient not taking: Reported on 12/05/2015 11/28/15   Estill Dooms, NP    Allergies as of 11/16/2015 - Review Complete 11/16/2015  Allergen Reaction Noted  . Prednisone Swelling 01/30/2015    Family History  Problem Relation Age of Onset  . Heart  failure Mother   . Hypertension Mother   . Diabetes Mother   . Heart failure Father   . Hypertension Father   . Heart failure Brother   . Hypertension Sister   . Other Sister     blocked artery in neck; knee replacement  . Other Brother     triple bypass surgery  . Hypertension Sister   . Diabetes Sister   . Colon cancer Neg Hx     Social History   Social History  . Marital status: Married    Spouse name: N/A  . Number of children: N/A  . Years of education: N/A   Occupational History  . Not on file.   Social History Main Topics  . Smoking status: Former Smoker    Types: Cigarettes    Start date: 01/07/1975    Quit date: 01/06/1998  . Smokeless tobacco: Never Used  . Alcohol use No  . Drug use: No  . Sexual activity: Yes    Birth control/ protection: Post-menopausal, Surgical     Comment: tubal   Other Topics Concern  . Not on file   Social History Narrative  . No narrative on file    Review of Systems: See HPI, otherwise negative ROS   Physical Exam: BP (!) 166/77   Pulse 68   Temp 98.1 F (36.7 C) (Oral)   Resp 14   SpO2 99%  General:   Alert,  pleasant and cooperative in NAD Head:  Normocephalic and atraumatic. Neck:  Supple; Lungs:  Clear throughout to auscultation.    Heart:  Regular rate and rhythm. Abdomen:  Soft, nontender and nondistended. Normal bowel sounds, without guarding, and without rebound.   Neurologic:  Alert and  oriented x4;  grossly normal neurologically.  Impression/Plan:     DYSPHAGIA/DYSPEPSIA  PLAN:  EGD/DIL TODAY. DISCUSSED PROCEDURE, BENEFITS, & RISKS: < 1% chance of medication reaction, PERFORATION, OR bleeding.

## 2015-12-11 ENCOUNTER — Encounter (INDEPENDENT_AMBULATORY_CARE_PROVIDER_SITE_OTHER): Payer: Self-pay

## 2015-12-11 DIAGNOSIS — M48062 Spinal stenosis, lumbar region with neurogenic claudication: Secondary | ICD-10-CM | POA: Diagnosis not present

## 2015-12-11 DIAGNOSIS — E663 Overweight: Secondary | ICD-10-CM | POA: Diagnosis not present

## 2015-12-11 DIAGNOSIS — G894 Chronic pain syndrome: Secondary | ICD-10-CM | POA: Diagnosis not present

## 2015-12-11 DIAGNOSIS — M5417 Radiculopathy, lumbosacral region: Secondary | ICD-10-CM | POA: Diagnosis not present

## 2015-12-11 DIAGNOSIS — E038 Other specified hypothyroidism: Secondary | ICD-10-CM | POA: Diagnosis not present

## 2015-12-11 DIAGNOSIS — E2749 Other adrenocortical insufficiency: Secondary | ICD-10-CM | POA: Diagnosis not present

## 2015-12-11 DIAGNOSIS — E2839 Other primary ovarian failure: Secondary | ICD-10-CM | POA: Diagnosis not present

## 2015-12-14 ENCOUNTER — Encounter (HOSPITAL_COMMUNITY): Payer: Self-pay | Admitting: Gastroenterology

## 2015-12-27 ENCOUNTER — Telehealth: Payer: Self-pay | Admitting: Gastroenterology

## 2015-12-27 NOTE — Telephone Encounter (Signed)
Pt is aware of results ans what [facility] said

## 2015-12-27 NOTE — Telephone Encounter (Signed)
Please call pt. HER stomach Bx shows mild gastritis.    CONTINUE YOUR WEIGHT LOSS EFFORTS. YOU SHOULD LOSE 20 LBS.   FOLLOW A LOW FAT DIET. MEATS SHOULD BE BAKED, BROILED, OR BOILED. AVOID FRIED FOODS. SEE INFO BELOW.  CONTINUE DEXILANT  OPEN LINZESS CAPSULE. PLACE GRANULES IN 4 TEASPOONS OF WATER. STIR IT FOR 30 SECONDS. TAKE 3 TSP OF THE WATER DAILY. THIS SHOULD PREVENT WATERY STOOLS OR EXPLOSIVE DIARRHEA.  FOLLOW UP IN 4 MOS E30 DYSPHAGIA, CONSTIPATION.

## 2015-12-27 NOTE — Telephone Encounter (Signed)
Ov made °

## 2016-01-07 DIAGNOSIS — Z7901 Long term (current) use of anticoagulants: Secondary | ICD-10-CM

## 2016-01-07 DIAGNOSIS — I4891 Unspecified atrial fibrillation: Secondary | ICD-10-CM

## 2016-01-07 HISTORY — DX: Long term (current) use of anticoagulants: Z79.01

## 2016-01-07 HISTORY — DX: Unspecified atrial fibrillation: I48.91

## 2016-01-23 ENCOUNTER — Ambulatory Visit: Payer: BLUE CROSS/BLUE SHIELD | Admitting: Cardiovascular Disease

## 2016-01-30 ENCOUNTER — Encounter: Payer: Self-pay | Admitting: Cardiovascular Disease

## 2016-01-30 ENCOUNTER — Ambulatory Visit (INDEPENDENT_AMBULATORY_CARE_PROVIDER_SITE_OTHER): Payer: BLUE CROSS/BLUE SHIELD | Admitting: Cardiovascular Disease

## 2016-01-30 VITALS — BP 158/86 | HR 73 | Ht 67.0 in | Wt 250.0 lb

## 2016-01-30 DIAGNOSIS — I10 Essential (primary) hypertension: Secondary | ICD-10-CM | POA: Diagnosis not present

## 2016-01-30 DIAGNOSIS — I25118 Atherosclerotic heart disease of native coronary artery with other forms of angina pectoris: Secondary | ICD-10-CM | POA: Diagnosis not present

## 2016-01-30 DIAGNOSIS — R252 Cramp and spasm: Secondary | ICD-10-CM | POA: Diagnosis not present

## 2016-01-30 DIAGNOSIS — R600 Localized edema: Secondary | ICD-10-CM | POA: Diagnosis not present

## 2016-01-30 MED ORDER — SPIRONOLACTONE 25 MG PO TABS: 25.0000 mg | ORAL_TABLET | Freq: Every day | ORAL | 3 refills | Status: DC

## 2016-01-30 NOTE — Patient Instructions (Signed)
Medication Instructions:  Stop HYDRALAZINE  Start Spironolactone 25 mg daily   Labwork: none  Testing/Procedures: none  Follow-Up: Your physician recommends that you schedule a follow-up appointment in: 3 months    Any Other Special Instructions Will Be Listed Below (If Applicable).     If you need a refill on your cardiac medications before your next appointment, please call your pharmacy.

## 2016-01-30 NOTE — Progress Notes (Signed)
SUBJECTIVE: The patient presents for routine follow-up. She was evaluated in our clinic on 10/31/15 and was complaining of leg cramps and attributed them to spironolactone. This was discontinued in favor of hydralazine.  Nuclear stress testing on 06/06/15 was low risk overall with a small amount of myocardium in jeopardy. There was a small mild apical infarct with mild peri-infarct ischemia.  Echocardiogram showed normal left ventricular systolic function and regional wall motion, LVEF 0000000, with diastolic dysfunction and mild to moderate LVH as well.  After stopping spironolactone her leg cramps did not go away. Hydralazine caused nausea so she has only been taking it once daily. She uses compression stockings but says she stands a lot for work and is also outside in the cold. Speed walking leads to exertional dyspnea.  Blood pressure 158/86 today.    Review of Systems: As per "subjective", otherwise negative.  Allergies  Allergen Reactions  . Prednisone Swelling    Current Outpatient Prescriptions  Medication Sig Dispense Refill  . acetaminophen (TYLENOL) 500 MG tablet Take 500 mg by mouth as needed for moderate pain or headache.     Marland Kitchen amLODipine (NORVASC) 10 MG tablet Take 10 mg by mouth daily.    [name] THYROID 30 MG tablet Take 60 mg by mouth daily.   4  . aspirin 81 MG tablet Take 81 mg by mouth daily.    Marland Kitchen atorvastatin (LIPITOR) 40 MG tablet Take 1 tablet (40 mg total) by mouth daily. (Patient taking differently: Take 40 mg by mouth every evening. ) 90 tablet 3  . cephALEXin (KEFLEX) 500 MG capsule Take 1 capsule (500 mg total) by mouth 3 (three) times daily. 21 capsule 0  . Cholecalciferol (VITAMIN D3) 10000 units TABS Take 10,000 tablets by mouth daily.     . Cyanocobalamin (B-12) 3000 MCG CAPS Take 1 capsule by mouth daily.    . cyclobenzaprine (FLEXERIL) 5 MG tablet Take 1 tablet (5 mg total) by mouth 3 (three) times daily as needed for muscle spasms. (Patient  taking differently: Take 5 mg by mouth as needed for muscle spasms. ) 30 tablet 0  . dexlansoprazole (DEXILANT) 60 MG capsule Take 1 capsule (60 mg total) by mouth daily. 30 capsule 3  . fluticasone (FLONASE) 50 MCG/ACT nasal spray Place 2 sprays into the nose as needed for allergies or rhinitis.     . Glycerin-Polysorbate 80 (REFRESH DRY EYE THERAPY OP) Apply 1 drop to eye daily as needed (dry eyes).    Marland Kitchen ibuprofen (ADVIL,MOTRIN) 200 MG tablet Take 800 mg by mouth as needed for headache or moderate pain.     Marland Kitchen linaclotide (LINZESS) 145 MCG CAPS capsule Take 1 capsule (145 mcg total) by mouth daily before breakfast. 30 capsule 3  . losartan (COZAAR) 100 MG tablet Take 100 mg by mouth daily.    . Magnesium 400 MG CAPS Take 800 mg by mouth as needed (cramps).     . hydrALAZINE (APRESOLINE) 10 MG tablet Take 1 tablet (10 mg total) by mouth 3 (three) times daily. 270 tablet 3   No current facility-administered medications for this visit.     Past Medical History:  Diagnosis Date  . Back pain   . Body aches 11/21/2014  . BV (bacterial vaginosis) 05/23/2013  . Constipation 11/21/2014  . Elevated cholesterol 11/02/2013  . Hematuria 05/23/2013  . Hyperlipidemia   . Hypertension   . Migraines   . Pelvic pain in female 11/02/2013  . Plantar fasciitis of  right foot   . Thyroid disease   . Vaginal discharge 03/24/2014  . Vaginal irritation 05/23/2013  . Vaginal itching 03/24/2014    Past Surgical History:  Procedure Laterality Date  . ECTOPIC PREGNANCY SURGERY    . ESOPHAGOGASTRODUODENOSCOPY  12/19/2009   [id number] stricture s/p dilation/mild gastritis  . ESOPHAGOGASTRODUODENOSCOPY N/A 12/10/2015   Procedure: ESOPHAGOGASTRODUODENOSCOPY (EGD);  Surgeon: Danie Binder, MD;  Location: [facility];  Service: Endoscopy;  Laterality: N/A;  830  . ileocolonoscopy  12/19/2009   [id number] polyps/mild left-side diverticulosis/hemorrhoids  . KNEE SURGERY    . TUBAL LIGATION      Social  History   Social History  . Marital status: Married    Spouse name: N/A  . Number of children: N/A  . Years of education: N/A   Occupational History  . Not on file.   Social History Main Topics  . Smoking status: Former Smoker    Types: Cigarettes    Start date: 01/07/1975    Quit date: 01/06/1998  . Smokeless tobacco: Never Used  . Alcohol use No  . Drug use: No  . Sexual activity: Yes    Birth control/ protection: Post-menopausal, Surgical     Comment: tubal   Other Topics Concern  . Not on file   Social History Narrative  . No narrative on file     Vitals:   01/30/16 0925  BP: (!) 158/86  Pulse: 73  SpO2: 97%  Weight: 250 lb (113.4 kg)  Height: 5\' 7"  (1.702 m)    PHYSICAL EXAM General: NAD Neck: No JVD, no thyromegaly or thyroid nodule.  Lungs: Clear to auscultation bilaterally with normal respiratory effort. CV: Nondisplaced PMI. Regular rate and rhythm, normal S1/S2, no S3/S4, no murmur. Trivial periankle edema b/l. No carotid bruit.  Abdomen: Soft, obese.  Skin: Intact without lesions or rashes.  Neurologic: Alert and oriented x 3.  Psych: Normal affect. Extremities: No clubbing or cyanosis.  HEENT: Normal.     ECG: Most recent ECG reviewed.      ASSESSMENT AND PLAN: 1. Chest and left arm discomfort/CAD: Symptomatically stable. Nuclear stress testing was low risk overall with a small amount of myocardium in jeopardy. There was a small mild apical infarct with mild peri-infarct ischemia. Echocardiogram showed normal left ventricular systolic function and regional wall motion, LVEF 0000000, with diastolic dysfunction and mild to moderate LVH as well. Will manage medically. Continue ASA 81 mg and Lipitor 40 mg daily.  2. Essential HTN: Elevated. Did not tolerate hydralazine (nausea)  Already taking amlodipine 10 mg and losartan 100 mg. Will restart spironolactone 25 mg daily.  3. Bilateral leg swelling: Use compression stockings.  Dispo:  fu 3 months.   Kate Sable, M.D., F.A.C.C.

## 2016-03-13 ENCOUNTER — Other Ambulatory Visit (INDEPENDENT_AMBULATORY_CARE_PROVIDER_SITE_OTHER): Payer: BLUE CROSS/BLUE SHIELD

## 2016-03-13 ENCOUNTER — Ambulatory Visit (INDEPENDENT_AMBULATORY_CARE_PROVIDER_SITE_OTHER): Payer: BLUE CROSS/BLUE SHIELD | Admitting: Adult Health

## 2016-03-13 ENCOUNTER — Encounter: Payer: Self-pay | Admitting: Adult Health

## 2016-03-13 VITALS — BP 130/76 | HR 83 | Ht 67.0 in | Wt 250.0 lb

## 2016-03-13 DIAGNOSIS — N854 Malposition of uterus: Secondary | ICD-10-CM

## 2016-03-13 DIAGNOSIS — D259 Leiomyoma of uterus, unspecified: Secondary | ICD-10-CM | POA: Diagnosis not present

## 2016-03-13 DIAGNOSIS — D252 Subserosal leiomyoma of uterus: Secondary | ICD-10-CM | POA: Diagnosis not present

## 2016-03-13 DIAGNOSIS — D219 Benign neoplasm of connective and other soft tissue, unspecified: Secondary | ICD-10-CM

## 2016-03-13 DIAGNOSIS — N898 Other specified noninflammatory disorders of vagina: Secondary | ICD-10-CM

## 2016-03-13 DIAGNOSIS — D251 Intramural leiomyoma of uterus: Secondary | ICD-10-CM | POA: Diagnosis not present

## 2016-03-13 DIAGNOSIS — R102 Pelvic and perineal pain: Secondary | ICD-10-CM | POA: Diagnosis not present

## 2016-03-13 DIAGNOSIS — B379 Candidiasis, unspecified: Secondary | ICD-10-CM | POA: Insufficient documentation

## 2016-03-13 DIAGNOSIS — L298 Other pruritus: Secondary | ICD-10-CM

## 2016-03-13 HISTORY — DX: Benign neoplasm of connective and other soft tissue, unspecified: D21.9

## 2016-03-13 LAB — POCT WET PREP (WET MOUNT): WBC, Wet Prep HPF POC: POSITIVE

## 2016-03-13 MED ORDER — NYSTATIN-TRIAMCINOLONE 100000-0.1 UNIT/GM-% EX CREA: 1.0000 "application " | TOPICAL_CREAM | Freq: Two times a day (BID) | CUTANEOUS | 1 refills | Status: DC

## 2016-03-13 MED ORDER — FLUCONAZOLE 150 MG PO TABS: ORAL_TABLET | ORAL | 0 refills | Status: DC

## 2016-03-13 NOTE — Progress Notes (Addendum)
PELVIC TA/TV: heterogeneous retroflexed uterus w/mult.fibroids, (#1) subserosal fibroid fundal 2 x 1.9 x 1.8 cm,(#2)ant intramural fibroid 1.2 x 1.3 x 1.4 cm, heterogeneous thickened endometrium,normal left ovary,right ovary not visualized, ? right oophorectomy

## 2016-03-13 NOTE — Progress Notes (Signed)
Subjective:     Patient ID: Becky [name], female   DOB: November 02, 1960, 56 y.o.   MRN: [id number]  HPI Becky [name] is a 56 year old black female, married in complaining of some pelvic pain and vaginal itching for about a week, she has been at hospital with husband who is having rectal bleeding.  Review of Systems +pelvic pain +vaginal itching  Reviewed past medical,surgical, social and family history. Reviewed medications and allergies.     Objective:   Physical Exam BP 130/76 (BP Location: Left Arm, Patient Position: Sitting, Cuff Size: Normal)   Pulse 83   Ht 5\' 7"  (1.702 m)   Wt 250 lb (113.4 kg)   BMI 39.16 kg/m  Skin warm and dry.Pelvic: external genitalia is normal in appearance no lesions, vagina: white discharge without odor,urethra has no lesions or masses noted, cervix:smooth and bulbous, uterus: normal size, shape and contour, mildly tender, no masses felt, adnexa: no masses or tenderness noted. Bladder is non tender and no masses felt. Wet prep: + yeast and +WBCs.  Will get [place] now to assess: US showed normal left ovary, no right ovary seen and adnexa was normal, uterus has multiple fibroids and endometrium was 5 mm and has some cystic areas, will await final reading by MD, but did not think this was source of pelvic pain, could be muscle strain from pushing a work. Face time 15 minutes with 50% counseling.     Assessment:     1. Pelvic pain   2. Vaginal itching   3. Yeast infection   4. Vaginal discharge   5. Uterine leiomyoma, unspecified location       Plan:     Meds ordered this encounter  Medications  . fluconazole (DIFLUCAN) 150 MG tablet    Sig: Take 1 now and 1 in 3 days    Dispense:  2 tablet    Refill:  0    Order Specific Question:   Supervising Provider    Answer:   EURE, LUTHER H [[id number]]  . nystatin-triamcinolone (MYCOLOG II) cream    Sig: Apply 1 application topically 2 (two) times daily.    Dispense:  30 g    Refill:  1    Order Specific Question:    Supervising Provider    Answer:   Florian Buff [[id number]]  Will call with [place] final reading Follow up prn

## 2016-03-31 ENCOUNTER — Ambulatory Visit: Payer: BLUE CROSS/BLUE SHIELD | Admitting: Obstetrics & Gynecology

## 2016-03-31 ENCOUNTER — Encounter: Payer: Self-pay | Admitting: Obstetrics & Gynecology

## 2016-03-31 ENCOUNTER — Other Ambulatory Visit: Payer: Self-pay | Admitting: Obstetrics & Gynecology

## 2016-03-31 VITALS — BP 126/74 | HR 82 | Wt 248.0 lb

## 2016-03-31 DIAGNOSIS — R938 Abnormal findings on diagnostic imaging of other specified body structures: Secondary | ICD-10-CM

## 2016-03-31 DIAGNOSIS — Z3202 Encounter for pregnancy test, result negative: Secondary | ICD-10-CM | POA: Diagnosis not present

## 2016-03-31 DIAGNOSIS — R9389 Abnormal findings on diagnostic imaging of other specified body structures: Secondary | ICD-10-CM

## 2016-03-31 LAB — POCT URINE PREGNANCY: Preg Test, Ur: NEGATIVE

## 2016-03-31 NOTE — Addendum Note (Signed)
Addended by: Gaylyn Rong A on: 03/31/2016 11:12 AM   Modules accepted: Orders

## 2016-03-31 NOTE — Progress Notes (Signed)
Endometrial Biopsy Procedure Note  Pre-operative Diagnosis: thickened complex endometrium  Post-operative Diagnosis: normal  Indications: thickened cystic complex endometrium  Procedure Details   Urine pregnancy test was not done.  The risks (including infection, bleeding, pain, and uterine perforation) and benefits of the procedure were explained to the patient and Written informed consent was obtained.  Antibiotic prophylaxis against endocarditis was not indicated.   The patient was placed in the dorsal lithotomy position.  Bimanual exam showed the uterus to be in the anteroflexed position.  A Graves' speculum inserted in the vagina, and the cervix prepped with povidone iodine.  Endocervical curettage with a Kevorkian curette was not performed.   A sharp tenaculum was applied to the anterior lip of the cervix for stabilization.  A sterile uterine sound was used to sound the uterus to a depth of 5.5cm.  A Pipelle endometrial aspirator was used to sample the endometrium.  Sample was sent for pathologic examination.  Condition: Stable  Complications: None  Plan:  The patient was advised to call for any fever or for prolonged or severe pain or bleeding. She was advised to use OTC ibuprofen as needed for mild to moderate pain. She was advised to avoid vaginal intercourse for 48 hours or until the bleeding has completely stopped.  Attending Physician Documentation: Follow up 1 week to review the biopsy results

## 2016-04-07 ENCOUNTER — Ambulatory Visit (INDEPENDENT_AMBULATORY_CARE_PROVIDER_SITE_OTHER): Payer: BLUE CROSS/BLUE SHIELD | Admitting: Obstetrics & Gynecology

## 2016-04-07 ENCOUNTER — Encounter: Payer: Self-pay | Admitting: Obstetrics & Gynecology

## 2016-04-07 VITALS — BP 130/82 | HR 67 | Wt 248.0 lb

## 2016-04-07 DIAGNOSIS — R938 Abnormal findings on diagnostic imaging of other specified body structures: Secondary | ICD-10-CM | POA: Diagnosis not present

## 2016-04-07 DIAGNOSIS — R9389 Abnormal findings on diagnostic imaging of other specified body structures: Secondary | ICD-10-CM

## 2016-04-07 NOTE — Progress Notes (Signed)
Follow up appointment for results  Chief Complaint  Patient presents with  . Follow-up    biopsy result    Blood pressure 130/82, pulse 67, weight 248 lb (112.5 kg).  Endometrial Biopsy:  Benign endometrial glands    MEDS ordered this encounter: No orders of the defined types were placed in this encounter.   Orders for this encounter: No orders of the defined types were placed in this encounter.   Impression: Thickened endometrium     Plan: No follow up or further management needed  Follow Up: Return if symptoms worsen or fail to improve, for with Derrek Monaco.       Face to face time:  10 minutes  Greater than 50% of the visit time was spent in counseling and coordination of care with the patient.  The summary and outline of the counseling and care coordination is summarized in the note above.   All questions were answered.  Past Medical History:  Diagnosis Date  . Back pain   . Body aches 11/21/2014  . BV (bacterial vaginosis) 05/23/2013  . Constipation 11/21/2014  . Elevated cholesterol 11/02/2013  . Fibroids 03/13/2016  . Hematuria 05/23/2013  . Hyperlipidemia   . Hypertension   . Migraines   . Pelvic pain in female 11/02/2013  . Plantar fasciitis of right foot   . Thyroid disease   . Vaginal discharge 03/24/2014  . Vaginal irritation 05/23/2013  . Vaginal itching 03/24/2014    Past Surgical History:  Procedure Laterality Date  . ECTOPIC PREGNANCY SURGERY    . ESOPHAGOGASTRODUODENOSCOPY  12/19/2009   KYH:[id number] stricture s/p dilation/mild gastritis  . ESOPHAGOGASTRODUODENOSCOPY N/A 12/10/2015   Procedure: ESOPHAGOGASTRODUODENOSCOPY (EGD);  Surgeon: Danie Binder, MD;  Location: [facility];  Service: Endoscopy;  Laterality: N/A;  830  . ileocolonoscopy  12/19/2009   [id number]:[id number] polyps/mild left-side diverticulosis/hemorrhoids  . KNEE SURGERY    . TUBAL LIGATION      OB History    Gravida Para Term Preterm AB Living   6 3     3 3     SAB TAB Ectopic Multiple Live Births       3   3      Allergies  Allergen Reactions  . Hydralazine Nausea Only  . Prednisone Swelling    Social History   Social History  . Marital status: Married    Spouse name: N/A  . Number of children: N/A  . Years of education: N/A   Social History Main Topics  . Smoking status: Former Smoker    Types: Cigarettes    Start date: 01/07/1975    Quit date: 01/06/1998  . Smokeless tobacco: Never Used  . Alcohol use No  . Drug use: No  . Sexual activity: Yes    Birth control/ protection: Post-menopausal, Surgical     Comment: tubal   Other Topics Concern  . None   Social History Narrative  . None    Family History  Problem Relation Age of Onset  . Heart failure Mother   . Hypertension Mother   . Diabetes Mother   . Heart failure Father   . Hypertension Father   . Heart failure Brother   . Hypertension Sister   . Other Sister     blocked artery in neck; knee replacement  . Other Brother     triple bypass surgery  . Hypertension Sister   . Diabetes Sister   . Colon cancer Neg Hx

## 2016-04-09 ENCOUNTER — Encounter: Payer: Self-pay | Admitting: Gastroenterology

## 2016-04-09 ENCOUNTER — Ambulatory Visit: Payer: BLUE CROSS/BLUE SHIELD | Admitting: Gastroenterology

## 2016-04-09 ENCOUNTER — Telehealth: Payer: Self-pay | Admitting: Gastroenterology

## 2016-04-09 NOTE — Telephone Encounter (Signed)
PATIENT WAS A NO SHOW AND LETTER SENT  °

## 2016-04-29 ENCOUNTER — Encounter: Payer: Self-pay | Admitting: Cardiovascular Disease

## 2016-04-29 ENCOUNTER — Ambulatory Visit (INDEPENDENT_AMBULATORY_CARE_PROVIDER_SITE_OTHER): Payer: BLUE CROSS/BLUE SHIELD | Admitting: Cardiovascular Disease

## 2016-04-29 VITALS — BP 142/82 | HR 74 | Ht 67.0 in | Wt 245.0 lb

## 2016-04-29 DIAGNOSIS — R600 Localized edema: Secondary | ICD-10-CM | POA: Diagnosis not present

## 2016-04-29 DIAGNOSIS — R5383 Other fatigue: Secondary | ICD-10-CM

## 2016-04-29 DIAGNOSIS — I25118 Atherosclerotic heart disease of native coronary artery with other forms of angina pectoris: Secondary | ICD-10-CM

## 2016-04-29 DIAGNOSIS — R0683 Snoring: Secondary | ICD-10-CM | POA: Diagnosis not present

## 2016-04-29 DIAGNOSIS — R079 Chest pain, unspecified: Secondary | ICD-10-CM

## 2016-04-29 DIAGNOSIS — I10 Essential (primary) hypertension: Secondary | ICD-10-CM

## 2016-04-29 NOTE — Progress Notes (Signed)
SUBJECTIVE: The patient presents for follow-up of coronary artery disease, chest pain, hypertension, and bilateral leg edema.  ECG performed in the office today which I ordered and personally interpreted demonstrates normal sinus rhythm with no ischemic ST segment or T-wave abnormalities, nor any arrhythmias.  She has some upper sided left chest pains when pushing herself up out of a chair or turning to the left in bed. Blood pressures at home have been 158/95 and 170/100. She said her blood pressure goes up when she is stressed out.  She has a history of headaches. I asked her about snoring and she was told that she snores a lot. She also complains of fatigue.  She said she had a sleep study several years ago. She never went back for a follow-up study.  Blood pressure today is 142/82.  She denies leg swelling.    Review of Systems: As per "subjective", otherwise negative.  Allergies  Allergen Reactions  . Hydralazine Nausea Only  . Prednisone Swelling    Current Outpatient Prescriptions  Medication Sig Dispense Refill  . acetaminophen (TYLENOL) 500 MG tablet Take 500 mg by mouth as needed for moderate pain or headache.     Marland Kitchen amLODipine (NORVASC) 10 MG tablet Take 10 mg by mouth daily.    [name] THYROID 90 MG tablet Take 90 mg by mouth daily.   4  . aspirin 81 MG tablet Take 81 mg by mouth daily.    Marland Kitchen atorvastatin (LIPITOR) 40 MG tablet Take 1 tablet (40 mg total) by mouth daily. (Patient taking differently: Take 40 mg by mouth every evening. ) 90 tablet 3  . Cholecalciferol (VITAMIN D3) 10000 units TABS Take 10,000 tablets by mouth daily.     . Cyanocobalamin (B-12) 3000 MCG CAPS Take 1 capsule by mouth daily.    . cyclobenzaprine (FLEXERIL) 5 MG tablet Take 1 tablet (5 mg total) by mouth 3 (three) times daily as needed for muscle spasms. 30 tablet 0  . dexlansoprazole (DEXILANT) 60 MG capsule Take 1 capsule (60 mg total) by mouth daily. 30 capsule 3  . fluticasone  (FLONASE) 50 MCG/ACT nasal spray Place 2 sprays into the nose as needed for allergies or rhinitis.     . Glycerin-Polysorbate 80 (REFRESH DRY EYE THERAPY OP) Apply 1 drop to eye daily as needed (dry eyes).    Marland Kitchen ibuprofen (ADVIL,MOTRIN) 200 MG tablet Take 800 mg by mouth as needed for headache or moderate pain.     Marland Kitchen linaclotide (LINZESS) 145 MCG CAPS capsule Take 1 capsule (145 mcg total) by mouth daily before breakfast. 30 capsule 3  . losartan (COZAAR) 100 MG tablet Take 100 mg by mouth daily.    . Magnesium 400 MG CAPS Take 800 mg by mouth as needed (cramps).     . nystatin-triamcinolone (MYCOLOG II) cream Apply 1 application topically 2 (two) times daily. 30 g 1  . spironolactone (ALDACTONE) 25 MG tablet Take 1 tablet (25 mg total) by mouth daily. 90 tablet 3   No current facility-administered medications for this visit.     Past Medical History:  Diagnosis Date  . Back pain   . Body aches 11/21/2014  . BV (bacterial vaginosis) 05/23/2013  . Constipation 11/21/2014  . Elevated cholesterol 11/02/2013  . Fibroids 03/13/2016  . Hematuria 05/23/2013  . Hyperlipidemia   . Hypertension   . Migraines   . Pelvic pain in female 11/02/2013  . Plantar fasciitis of right foot   . Thyroid  disease   . Vaginal discharge 03/24/2014  . Vaginal irritation 05/23/2013  . Vaginal itching 03/24/2014    Past Surgical History:  Procedure Laterality Date  . ECTOPIC PREGNANCY SURGERY    . ESOPHAGOGASTRODUODENOSCOPY  12/19/2009   ION:GEXBMW stricture s/p dilation/mild gastritis  . ESOPHAGOGASTRODUODENOSCOPY N/A 12/10/2015   Procedure: ESOPHAGOGASTRODUODENOSCOPY (EGD);  Surgeon: Danie Binder, MD;  Location: [facility];  Service: Endoscopy;  Laterality: N/A;  830  . ileocolonoscopy  12/19/2009   [id number] polyps/mild left-side diverticulosis/hemorrhoids  . KNEE SURGERY    . TUBAL LIGATION      Social History   Social History  . Marital status: Married    Spouse name: N/A  . Number of  children: N/A  . Years of education: N/A   Occupational History  . Not on file.   Social History Main Topics  . Smoking status: Former Smoker    Types: Cigarettes    Start date: 01/07/1975    Quit date: 01/06/1998  . Smokeless tobacco: Never Used  . Alcohol use No  . Drug use: No  . Sexual activity: Yes    Birth control/ protection: Post-menopausal, Surgical     Comment: tubal   Other Topics Concern  . Not on file   Social History Narrative  . No narrative on file     Vitals:   04/29/16 1101  BP: (!) 142/82  Pulse: 74  SpO2: 94%  Weight: 245 lb (111.1 kg)  Height: 5\' 7"  (1.702 m)    Wt Readings from Last 3 Encounters:  04/29/16 245 lb (111.1 kg)  04/07/16 248 lb (112.5 kg)  03/31/16 248 lb (112.5 kg)     PHYSICAL EXAM General: NAD HEENT: Normal. Neck: No JVD, no thyromegaly. Lungs: Clear to auscultation bilaterally with normal respiratory effort. CV: Nondisplaced PMI.  Regular rate and rhythm, normal S1/S2, no S3/S4, no murmur. No pretibial or periankle edema.  No carotid bruit.   Abdomen: Soft, nontender, obese.  Neurologic: Alert and oriented.  Psych: Normal affect. Skin: Normal. Musculoskeletal: No gross deformities.    ECG: Most recent ECG reviewed.   Labs: Lab Results  Component Value Date/Time   K 4.1 08/01/2015 12:29 PM   BUN 10 08/01/2015 12:29 PM   BUN 10 03/28/2015 03:48 PM   CREATININE 0.77 08/01/2015 12:29 PM   ALT 30 05/22/2015 04:58 AM   TSH 1.840 03/28/2015 03:48 PM   HGB 13.4 05/22/2015 04:58 AM     Lipids: Lab Results  Component Value Date/Time   LDLCALC 182 (H) 03/28/2015 03:48 PM   CHOL 268 (H) 03/28/2015 03:48 PM   TRIG 91 03/28/2015 03:48 PM   HDL 68 03/28/2015 03:48 PM       ASSESSMENT AND PLAN: 1. Chest and left arm discomfort/CAD: Symptomatically stable. Nuclear stress testing was low risk overall with a small amount of myocardium in jeopardy. There was a small mild apical infarct with mild peri-infarct ischemia. I  will continue to manage medically with aspirin and Lipitor.  2. Essential HTN: Mildly elevated. Will monitor. Continue amlodipine 10 mg, losartan 100 mg, and spironolactone 25 mg. She did not tolerate hydralazine in the past as it led to nausea. I will order a sleep study given her history of snoring and fatigue.  3. Bilateral leg swelling: Continue compression stocking use.  4. Fatigue and snoring with hypertension: She very well may have sleep apnea. I will order a sleep study. Sleep apnea is also a risk factor for CVA, MI, and arrhythmias.  Disposition: Follow up 6 months  Kate Sable, M.D., F.A.C.C.

## 2016-04-29 NOTE — Patient Instructions (Signed)
Your physician wants you to follow-up in: 6 months with Dr Virgina Jock will receive a reminder letter in the mail two months in advance. If you don't receive a letter, please call our office to schedule the follow-up appointment.    Your physician recommends that you continue on your current medications as directed. Please refer to the Current Medication list given to you today.     Your physician has recommended that you have a sleep study. This test records several body functions during sleep, including: brain activity, eye movement, oxygen and carbon dioxide blood levels, heart rate and rhythm, breathing rate and rhythm, the flow of air through your mouth and nose, snoring, body muscle movements, and chest and belly movement.     If you need a refill on your cardiac medications before your next appointment, please call your pharmacy.     Thank you for choosing [facility] !

## 2016-05-11 ENCOUNTER — Ambulatory Visit: Payer: BLUE CROSS/BLUE SHIELD | Attending: Cardiovascular Disease | Admitting: Neurology

## 2016-05-11 DIAGNOSIS — Z7982 Long term (current) use of aspirin: Secondary | ICD-10-CM | POA: Insufficient documentation

## 2016-05-11 DIAGNOSIS — I10 Essential (primary) hypertension: Secondary | ICD-10-CM | POA: Diagnosis present

## 2016-05-11 DIAGNOSIS — R0683 Snoring: Secondary | ICD-10-CM | POA: Diagnosis not present

## 2016-05-11 DIAGNOSIS — G4733 Obstructive sleep apnea (adult) (pediatric): Secondary | ICD-10-CM | POA: Diagnosis not present

## 2016-05-11 DIAGNOSIS — Z79899 Other long term (current) drug therapy: Secondary | ICD-10-CM | POA: Insufficient documentation

## 2016-05-27 ENCOUNTER — Telehealth: Payer: Self-pay | Admitting: Cardiovascular Disease

## 2016-05-27 NOTE — Telephone Encounter (Signed)
Pt would like to know the results from her sleep study

## 2016-05-27 NOTE — Telephone Encounter (Signed)
Called pt., no answer. Left message for pt to return call.  

## 2016-05-28 NOTE — Telephone Encounter (Addendum)
Patient notified that results are not available at this time. Called Dr. Freddie Apley office for results and was told by [facility] that the normal turn around is 3 weeks.

## 2016-06-06 NOTE — Progress Notes (Signed)
  [place] A. Merlene Laughter, MD     [email or web address]        NOCTURNAL POLYSOMNOGRAM    LOCATION: SLEEP LAB FACILITY: [facility]   PHYSICIAN: Desia Saban A. Merlene Laughter, M.D.   DATE OF STUDY: 05/11/16    INDICATIONS: Snoring and faigue  MEDICATIONS:  Prior to Admission medications   Medication Sig Start Date End Date Taking? Authorizing Provider  acetaminophen (TYLENOL) 500 MG tablet Take 500 mg by mouth as needed for moderate pain or headache.     [provider]  amLODipine (NORVASC) 10 MG tablet Take 10 mg by mouth daily. 09/21/14   [provider]  ARMOUR THYROID 90 MG tablet Take 90 mg by mouth daily.  09/04/15   [provider]  aspirin 81 MG tablet Take 81 mg by mouth daily.    [provider]  atorvastatin (LIPITOR) 40 MG tablet Take 1 tablet (40 mg total) by mouth daily. Patient taking differently: Take 40 mg by mouth every evening.  07/13/15   Herminio Commons, MD  Cholecalciferol (VITAMIN D3) 10000 units TABS Take 10,000 tablets by mouth daily.     [provider]  Cyanocobalamin (B-12) 3000 MCG CAPS Take 1 capsule by mouth daily.    [provider]  cyclobenzaprine (FLEXERIL) 5 MG tablet Take 1 tablet (5 mg total) by mouth 3 (three) times daily as needed for muscle spasms. 02/12/15   Le, Thao P, DO  dexlansoprazole (DEXILANT) 60 MG capsule Take 1 capsule (60 mg total) by mouth daily. 11/27/15   Carlis Stable, NP  fluticasone (FLONASE) 50 MCG/ACT nasal spray Place 2 sprays into the nose as needed for allergies or rhinitis.     [provider]  Glycerin-Polysorbate 80 (REFRESH DRY EYE THERAPY OP) Apply 1 drop to eye daily as needed (dry eyes).    [provider]  ibuprofen (ADVIL,MOTRIN) 200 MG tablet Take 800 mg by mouth as needed for headache or moderate pain.     [provider]  linaclotide Rolan Lipa) 145 MCG CAPS capsule Take 1 capsule (145 mcg total) by mouth daily before breakfast.  11/27/15   Carlis Stable, NP  losartan (COZAAR) 100 MG tablet Take 100 mg by mouth daily. 09/21/14   [provider]  Magnesium 400 MG CAPS Take 800 mg by mouth as needed (cramps).     [provider]  nystatin-triamcinolone (MYCOLOG II) cream Apply 1 application topically 2 (two) times daily. 03/13/16   Estill Dooms, NP  spironolactone (ALDACTONE) 25 MG tablet Take 1 tablet (25 mg total) by mouth daily. 01/30/16 04/29/16  Herminio Commons, MD       ARCHITECTURAL SUMMARY: Total recording time was 404 minutes. Sleep efficiency 93 %. Sleep latency 8 minutes. REM latency 69 minutes. Stage NI 1.6 %, N2 44 % and N3 29 % and REM sleep 25 %.    RESPIRATORY DATA:  Baseline oxygen saturation is 99 %. The lowest saturation is 84 %. The diagnostic AHI is 15. The REM AHI is 22.  LIMB MOVEMENT SUMMARY: PLM index 0.     IMPRESSION:  1. Mild obstructive sleep apnea syndrome is noted. Formal CPAP titration is suggested.   Thanks for this referral.  Maximus Hoffert A. Merlene Laughter, M.D. Diplomat, Tax adviser of Sleep Medicine.

## 2016-06-06 NOTE — Procedures (Signed)
[place] A. Merlene Laughter, MD     [email or web address]             NOCTURNAL POLYSOMNOGRAPHY   LOCATION: [facility]  NOCTURNAL POLYSOMNOGRAM    LOCATION: SLEEP LAB FACILITY: [facility]   PHYSICIAN: Ailish Prospero A. Merlene Laughter, M.D.   DATE OF STUDY: 05/11/16    INDICATIONS: Snoring and faigue  MEDICATIONS:  Prior to Admission medications   Medication Sig Start Date End Date Taking? Authorizing Provider  acetaminophen (TYLENOL) 500 MG tablet Take 500 mg by mouth as needed for moderate pain or headache.     [provider]  amLODipine (NORVASC) 10 MG tablet Take 10 mg by mouth daily. 09/21/14   [provider]  ARMOUR THYROID 90 MG tablet Take 90 mg by mouth daily.  09/04/15   [provider]  aspirin 81 MG tablet Take 81 mg by mouth daily.    [provider]  atorvastatin (LIPITOR) 40 MG tablet Take 1 tablet (40 mg total) by mouth daily. Patient taking differently: Take 40 mg by mouth every evening.  07/13/15   Herminio Commons, MD  Cholecalciferol (VITAMIN D3) 10000 units TABS Take 10,000 tablets by mouth daily.     [provider]  Cyanocobalamin (B-12) 3000 MCG CAPS Take 1 capsule by mouth daily.    [provider]  cyclobenzaprine (FLEXERIL) 5 MG tablet Take 1 tablet (5 mg total) by mouth 3 (three) times daily as needed for muscle spasms. 02/12/15   Le, Thao P, DO  dexlansoprazole (DEXILANT) 60 MG capsule Take 1 capsule (60 mg total) by mouth daily. 11/27/15   Carlis Stable, NP  fluticasone (FLONASE) 50 MCG/ACT nasal spray Place 2 sprays into the nose as needed for allergies or rhinitis.     [provider]  Glycerin-Polysorbate 80 (REFRESH DRY EYE THERAPY OP) Apply 1 drop to eye daily as needed (dry eyes).    [provider]  ibuprofen (ADVIL,MOTRIN) 200 MG tablet Take 800 mg by mouth as needed for headache or moderate pain.     [provider]  linaclotide Rolan Lipa) 145 MCG CAPS capsule Take 1  capsule (145 mcg total) by mouth daily before breakfast. 11/27/15   Carlis Stable, NP  losartan (COZAAR) 100 MG tablet Take 100 mg by mouth daily. 09/21/14   [provider]  Magnesium 400 MG CAPS Take 800 mg by mouth as needed (cramps).     [provider]  nystatin-triamcinolone (MYCOLOG II) cream Apply 1 application topically 2 (two) times daily. 03/13/16   Estill Dooms, NP  spironolactone (ALDACTONE) 25 MG tablet Take 1 tablet (25 mg total) by mouth daily. 01/30/16 04/29/16  Herminio Commons, MD       ARCHITECTURAL SUMMARY: Total recording time was 404 minutes. Sleep efficiency 93 %. Sleep latency 8 minutes. REM latency 69 minutes. Stage NI 1.6 %, N2 44 % and N3 29 % and REM sleep 25 %.    RESPIRATORY DATA:  Baseline oxygen saturation is 99 %. The lowest saturation is 84 %. The diagnostic AHI is 15. The REM AHI is 22.  LIMB MOVEMENT SUMMARY: PLM index 0.     IMPRESSION:  1. Mild obstructive sleep apnea syndrome is noted. Formal CPAP titration is suggested.   Thanks for this referral.  Aerial Dilley A. Merlene Laughter, M.D. Diplomat, Tax adviser of Sleep Medicine.       ELECTRONICALLY SIGNED ON:  06/06/2016, 10:36 AM [facility] PH: [phone]   FX: [phone]  [place] OF SLEEP MEDICINE

## 2016-08-12 ENCOUNTER — Ambulatory Visit (HOSPITAL_COMMUNITY): Payer: BLUE CROSS/BLUE SHIELD | Attending: Podiatry | Admitting: Physical Therapy

## 2016-08-12 ENCOUNTER — Encounter (HOSPITAL_COMMUNITY): Payer: Self-pay | Admitting: Physical Therapy

## 2016-08-12 ENCOUNTER — Other Ambulatory Visit: Payer: Self-pay | Admitting: Adult Health

## 2016-08-12 ENCOUNTER — Telehealth (HOSPITAL_COMMUNITY): Payer: Self-pay | Admitting: Physical Therapy

## 2016-08-12 DIAGNOSIS — M25571 Pain in right ankle and joints of right foot: Secondary | ICD-10-CM | POA: Diagnosis present

## 2016-08-12 DIAGNOSIS — R262 Difficulty in walking, not elsewhere classified: Secondary | ICD-10-CM | POA: Diagnosis present

## 2016-08-12 DIAGNOSIS — R29898 Other symptoms and signs involving the musculoskeletal system: Secondary | ICD-10-CM | POA: Insufficient documentation

## 2016-08-12 DIAGNOSIS — M25572 Pain in left ankle and joints of left foot: Secondary | ICD-10-CM | POA: Diagnosis present

## 2016-08-12 NOTE — Therapy (Signed)
[facility] [address] [place], [place], [address] Phone: [phone]   Fax:  [phone]  Physical Therapy Evaluation  Patient Details  Name: Becky [name] MRN: [id number] Date of Birth: 08-08-1960 Referring Provider: Caprice Beaver   Encounter Date: 08/12/2016      PT End of Session - 08/12/16 1004    Visit Number 1   Number of Visits 13   Date for PT Re-Evaluation 09/02/16   Authorization Type BCBS Other    Authorization Time Period 08/12/16 to 09/23/16   PT Start Time 0818   PT Stop Time 0900   PT Time Calculation (min) 42 min   Activity Tolerance Patient tolerated treatment well   Behavior During Therapy [facility] for tasks assessed/performed      Past Medical History:  Diagnosis Date  . Back pain   . Body aches 11/21/2014  . BV (bacterial vaginosis) 05/23/2013  . Constipation 11/21/2014  . Elevated cholesterol 11/02/2013  . Fibroids 03/13/2016  . Hematuria 05/23/2013  . Hyperlipidemia   . Hypertension   . Migraines   . Pelvic pain in female 11/02/2013  . Plantar fasciitis of right foot   . Thyroid disease   . Vaginal discharge 03/24/2014  . Vaginal irritation 05/23/2013  . Vaginal itching 03/24/2014    Past Surgical History:  Procedure Laterality Date  . ECTOPIC PREGNANCY SURGERY    . ESOPHAGOGASTRODUODENOSCOPY  12/19/2009   MHD:[id number] stricture s/p dilation/mild gastritis  . ESOPHAGOGASTRODUODENOSCOPY N/A 12/10/2015   Procedure: ESOPHAGOGASTRODUODENOSCOPY (EGD);  Surgeon: Danie Binder, MD;  [facility];  Service: Endoscopy;  Laterality: N/A;  830  . ileocolonoscopy  12/19/2009   LGX:[id number] polyps/mild left-side diverticulosis/hemorrhoids  . KNEE SURGERY    . TUBAL LIGATION      There were no vitals filed for this visit.       Subjective Assessment - 08/12/16 0819    Subjective Patient arrives stating her plantar fasciitis is back; she has had 3 shots in in this year already, as it started  again in January. It is in her left foot, but her R foot continues to bother her some. She has been "Scratching' it, icing it, and wearing a band that is supposed to help with plantar fasciitis, and also using biofreeze.    Pertinent History HTN, borderline DM, hypothyroidism, hx LBP, hx R knee surgery (plate and screws)   How long can you sit comfortably? unlimited    How long can you stand comfortably? 2-3 hours    How long can you walk comfortably? immediate discomfort    Patient Stated Goals get rid of pain, walk comfortably    Currently in Pain? Yes   Pain Score 4    Pain Location Foot   Pain Orientation Left   Pain Descriptors / Indicators Sharp;Sore   Pain Type Chronic pain   Pain Radiating Towards none    Pain Onset More than a month ago   Pain Frequency Intermittent   Aggravating Factors  standing, walking    Pain Relieving Factors ice   Effect of Pain on Daily Activities moderate             [facility] PT Assessment - 08/12/16 0001      Assessment   Medical Diagnosis plantar fasciitis    Referring Provider Caprice Beaver    Onset Date/Surgical Date --  January 2018   Next MD Visit Dr. Caprice Beaver in 4 weeks    Prior Therapy PT in 2016 for this problem  Balance Screen   Has the patient fallen in the past 6 months No   Has the patient had a decrease in activity level because of a fear of falling?  Yes   Is the patient reluctant to leave their home because of a fear of falling?  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, walking, steps    Leisure exercising      Observation/Other Assessments   Observations R leg longer than L per supine assessment   patient reports it has been this way since surgery R knee      AROM   Right Ankle Dorsiflexion -3   Right Ankle Plantar Flexion 64    Right Ankle Inversion 32   Right Ankle Eversion 15   Left Ankle Dorsiflexion -3   Left Ankle Plantar Flexion 60   Left Ankle Inversion 24   Left Ankle Eversion 9   Lumbar Flexion WNL; RFIS no change in pain    Lumbar Extension approx 30% limited; REIS no change    Lumbar - Right Side Bend WNL    Lumbar - Left Side Bend WNL      Strength   Right Hip Flexion 5/5   Right Hip Extension 3+/5   Right Hip ABduction 4+/5   Left Hip Flexion 5/5   Left Hip Extension 3/5   Left Hip ABduction 3/5   Right Knee Flexion 4/5   Right Knee Extension 5/5   Left Knee Flexion 4-/5   Left Knee Extension 4+/5   Right Ankle Dorsiflexion 5/5   Right Ankle Inversion 5/5   Right Ankle Eversion 5/5   Left Ankle Dorsiflexion 5/5   Left Ankle Inversion 4+/5   Left Ankle Eversion 4/5     Palpation   Palpation comment TTP and muscle knotting/spasm noted lumbar paraspinals, B glutes, L TFL/ITB region; TTP B plantar fasciitis muscle region L more painful than R              Objective measurements completed on examination: See above findings.                  PT Education - 08/12/16 1003    Education provided Yes   Education Details prognosis, exam findings, POC, HEP; possible relation of LLD to hip and back pain as well as recurrent plantar fasciitis    Person(s) Educated Patient   Methods Explanation;Handout;Demonstration   Comprehension Verbalized understanding;Returned demonstration;Need further instruction          PT Short Term Goals - 08/12/16 1009      PT SHORT TERM GOAL #1   Title Patient to experience pain as being no more than 2/10 in weight bearing positions in order to improve QOL and work tolerance    Time 3   Period Weeks   Status New   Target Date 09/02/16     PT SHORT TERM GOAL #2   Title Patient to demonstrate an improvement of at least 10 degrees in B ankle dorsiflexion as well as inversion/eversion ROM as being equal between R and L feet in order to  improve mechancis and pain    Time 3   Period Weeks   Status New     PT SHORT TERM GOAL #3   Title Patient to demonstrate improved gait mechanics including reduction of antalgic pattern and heel toe gait  in order to improve mobility and walking tolerance    Time 3   Period Weeks   Status New     PT SHORT TERM GOAL #4   Title Patient to be compliant in correct performance of HEP, to be updated PRN    Time 1   Period Weeks   Status New   Target Date 08/19/16           PT Long Term Goals - 08/12/16 1013      PT LONG TERM GOAL #1   Title Patient to demonstrate functional strength as being 5/5 in all tested groups in order to improve mechanics and reduce pain    Time 6   Period Weeks   Status New   Target Date 09/23/16     PT LONG TERM GOAL #2   Title Patient to be able to stand and walk for at least 4 hours without pain in order to improve work tolerance and QOL    Time 6   Period Weeks   Status New     PT LONG TERM GOAL #3   Title Patient to report that she has been able to return to regular walking and exercise program without pain in order to improve QOL    Time 6   Period Weeks   Status New     PT LONG TERM GOAL #4   Title Patient to be compliant with advanced HEP to prevent recurrence of condition and maintain fucntional gains    Time 6   Period Weeks   Status New                Plan - 08/12/16 1004    Clinical Impression Statement Patient arrives with plantar fasciitis, now worse on the L than R, which she reports has been an issue since earlier this year; she has had some shots in her foot but it is not helping. Note that she has had an apparent structural leg length difference since having extensive surgery R knee and does not currently have a shoe lift to correct this; she also reports L hip and low back pain. Examination reveals knotting and spasm widespread, foot and ankle stiffness and immobility, functional weakness, gait impairment, and apparent  structural LLD possibly contributing to widespread orthopedic pain. Prescribed appropriate foot/ankle HEP as well as stretches for low back and shoulder as well due to reports of pain limiting function in these areas. Recommend skilled PT services to address functional deficits, reduce pain, and assist in return to optimal level of function.    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; recurrent plantar fasciitis    Clinical Presentation Stable   Clinical Presentation due to: biomechanics    Clinical Decision Making Low   Rehab Potential Good   Clinical Impairments Affecting Rehab Potential (+) success with PT in the past, motivated to participate; (-) recurrent condition, structural LLD, chronic hip and back pain    PT Frequency 2x / week   PT Duration 6 weeks   PT Treatment/Interventions ADLs/Self Care Home Management;Biofeedback;Cryotherapy;Electrical Stimulation;Iontophoresis 4mg /ml Dexamethasone;Moist Heat;Gait training;Stair training;Functional mobility training;Therapeutic activities;Therapeutic exercise;Balance training;Neuromuscular re-education;Patient/family education;Orthotic Fit/Training;Manual techniques   PT Next Visit Plan review eval and goals; trial ionto for 2 sessions; manual and ankle/foot mobility exercises; gait training    PT Home Exercise Plan Eval: ankle circles, ankle alphabet, gastroc stretch, SKTC, corner stretch    Recommended Other Services possible shoe lift    Consulted  and Agree with Plan of Care Patient      Patient will benefit from skilled therapeutic intervention in order to improve the following deficits and impairments:  Abnormal gait, Pain, Decreased coordination, Decreased strength, Decreased range of motion, Decreased balance, Difficulty walking, Impaired flexibility  Visit Diagnosis: Pain in left ankle and joints of left foot - Plan: PT plan of care cert/re-cert  Pain in right ankle and joints of right  foot - Plan: PT plan of care cert/re-cert  Difficulty in walking, not elsewhere classified - Plan: PT plan of care cert/re-cert  Other symptoms and signs involving the musculoskeletal system - Plan: PT plan of care cert/re-cert     Problem List Patient Active Problem List   Diagnosis Date Noted  . Yeast infection 03/13/2016  . Fibroids 03/13/2016  . Well woman exam with routine gynecological exam 11/28/2015  . Burning with urination 11/28/2015  . Subacute frontal sinusitis 11/28/2015  . Leg cramps 10/31/2015  . Chest pain 10/31/2015  . Varicose 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 77/82/4235  . Body aches 11/21/2014  . Constipation 11/21/2014  . Vaginal itching 03/24/2014  . Vaginal discharge 03/24/2014  . Pelvic pain 11/02/2013  . Elevated cholesterol 11/02/2013  . Low back pain 10/20/2013  . Stiffness of ankle joint 10/20/2013  . Pain in joint, ankle and foot 10/20/2013  . Vaginal irritation 05/23/2013  . Hematuria 05/23/2013  . BV (bacterial vaginosis) 05/23/2013  . HEMATOCHEZIA 12/05/2009  . Dysphagia 12/05/2009  . CHEST WALL PAIN, ANTERIOR 06/23/2007  . LEG PAIN 05/18/2007  . VAGINAL PRURITUS 04/16/2007  . GOITER 03/10/2007  . HYPOTHYROIDISM 03/10/2007  . DIABETES MELLITUS, TYPE II 03/10/2007  . HYPERLIPIDEMIA 03/10/2007  . ANEMIA-IRON DEFICIENCY 03/10/2007  . HYPERTENSION 03/10/2007  . RHINITIS, CHRONIC 03/10/2007  . ASTHMA, CHILDHOOD 03/10/2007  . GERD 03/10/2007  . PEPTIC ULCER DISEASE 03/10/2007  . MENOPAUSAL SYNDROME 03/10/2007    Deniece Ree PT, DPT [facility] [address] [place], [place], [address] Phone: [phone]   Fax:  [phone]  Name: Shelena Castelluccio MRN: [id number] Date of Birth: [birth year]

## 2016-08-12 NOTE — Telephone Encounter (Signed)
Placed equipment request for shoe lift in outgoing fax box.  Deniece Ree PT, DPT [phone]

## 2016-08-12 NOTE — Patient Instructions (Signed)
   ANKLE CIRCLES  Move your ankle in a clockwise patter for 15-20 repetitions, then counter clockwise for 15-20 repetitions.  Repeat 2-3 times per day.    ANKLE ABC's   While in a seated position, write out the alphabet in the air with your big toe.  Your ankle should be moving as you perform this, NOT the rest of your leg.  Repeat 1-2 repetitions of the alphabet each foot, 2-3 times per day.    SEATED CALF STRETCH - GASTROC  While sitting, use a towel or other strap looped around your foot. Gently pull your ankle back until a stretch is felt along the back of your lower leg. Maintain your target knee straight the entire time.  Hold for at least 30 seconds.  Repeat 2-3 times each leg, 2-3 times per day.    SINGLE KNEE TO CHEST STRETCH - SKTC  While lying on your back, use your hands and gently draw up a knee towards your chest.   Keep your other knee straight and lying on the ground.  Hold for 10 seconds.  Repeat 5 times each side, 1-2 times per day.     CORNER STRETCH  While standing at a corner of a wall, place your arms on the walls with elobws bent so that your upper arms are horizontal and your forearms are directed upwards as shown. Take one step forward towards the corner. Bend your front knee until a stretch is felt along the front of your chest and/or shoulders. Your arms should be pointed downward towards the ground.  NOTE: Your legs should control the stretch by bending or straightening your front knee.  Hold for 30 seconds.  Repeat 3 times, 1-2 times per day.

## 2016-08-15 ENCOUNTER — Encounter (HOSPITAL_COMMUNITY): Payer: Self-pay

## 2016-08-15 ENCOUNTER — Ambulatory Visit (HOSPITAL_COMMUNITY): Payer: BLUE CROSS/BLUE SHIELD

## 2016-08-15 DIAGNOSIS — M25571 Pain in right ankle and joints of right foot: Secondary | ICD-10-CM

## 2016-08-15 DIAGNOSIS — R29898 Other symptoms and signs involving the musculoskeletal system: Secondary | ICD-10-CM

## 2016-08-15 DIAGNOSIS — M25572 Pain in left ankle and joints of left foot: Secondary | ICD-10-CM | POA: Diagnosis not present

## 2016-08-15 DIAGNOSIS — R262 Difficulty in walking, not elsewhere classified: Secondary | ICD-10-CM

## 2016-08-15 NOTE — Therapy (Signed)
[facility] [place], [place], [address] Phone: [phone]   Fax:  [phone]  Physical Therapy Treatment  Patient Details  Name: Becky [name] MRN: [id number] Date of Birth: May 05, 1960 Referring Provider: Caprice Beaver   Encounter Date: 08/15/2016      PT End of Session - 08/15/16 1039    Visit Number 2   Number of Visits 13   Date for PT Re-Evaluation 09/02/16   Authorization Type BCBS Other    Authorization Time Period 08/12/16 to 09/23/16   PT Start Time 1033   PT Stop Time 1120   PT Time Calculation (min) 47 min   Activity Tolerance Patient tolerated treatment well   Behavior During Therapy [facility] for tasks assessed/performed      Past Medical History:  Diagnosis Date  . Back pain   . Body aches 11/21/2014  . BV (bacterial vaginosis) 05/23/2013  . Constipation 11/21/2014  . Elevated cholesterol 11/02/2013  . Fibroids 03/13/2016  . Hematuria 05/23/2013  . Hyperlipidemia   . Hypertension   . Migraines   . Pelvic pain in female 11/02/2013  . Plantar fasciitis of right foot   . Thyroid disease   . Vaginal discharge 03/24/2014  . Vaginal irritation 05/23/2013  . Vaginal itching 03/24/2014    Past Surgical History:  Procedure Laterality Date  . ECTOPIC PREGNANCY SURGERY    . ESOPHAGOGASTRODUODENOSCOPY  12/19/2009   IHK:VQQVZD stricture s/p dilation/mild gastritis  . ESOPHAGOGASTRODUODENOSCOPY N/A 12/10/2015   Procedure: ESOPHAGOGASTRODUODENOSCOPY (EGD);  Surgeon: Danie Binder, MD;  [facility];  Service: Endoscopy;  Laterality: N/A;  830  . ileocolonoscopy  12/19/2009   GLO:[id number] polyps/mild left-side diverticulosis/hemorrhoids  . KNEE SURGERY    . TUBAL LIGATION      There were no vitals filed for this visit.      Subjective Assessment - 08/15/16 1036    Subjective Pt reports increased pain with standing especially when initially standing then can get worse when standing/walking  for longer period of time.  Current pain scale 6-7/10 Lt foot.   Pertinent History HTN, borderline DM, hypothyroidism, hx LBP, hx R knee surgery (plate and screws)   Patient Stated Goals get rid of pain, walk comfortably    Currently in Pain? Yes   Pain Score 7    Pain Location Foot   Pain Orientation Left  plantar surface and lateral ankle   Pain Descriptors / Indicators Aching   Pain Type Chronic pain   Pain Radiating Towards none   Pain Onset More than a month ago   Pain Frequency Intermittent   Aggravating Factors  stanidng, walking   Pain Relieving Factors ice   Effect of Pain on Daily Activities moderate                         [facility] Adult PT Treatment/Exercise - 08/15/16 0001      Exercises   Exercises Ankle     Manual Therapy   Manual Therapy Soft tissue mobilization   Manual therapy comments manual complete separate rest of tx   Soft tissue mobilization STM to gastroc/soleus complex and plantar fascia in prone with passive DF stretch     Ankle Exercises: Stretches   Plantar Fascia Stretch 3 reps;30 seconds   Plantar Fascia Stretch Limitations heel off step   Gastroc Stretch 3 reps;30 seconds   Gastroc Stretch Limitations seated with towel     Ankle Exercises: Seated   ABC's  1 rep   Ankle Circles/Pumps 20 reps  both directions   BAPS Sitting;Level 3;10 reps  DF/PF; Inv/Ev; CW/CCW                PT Education - 08/15/16 1057    Education provided Yes   Education Details Reviewed goals, assured complaince iwth HEP, copy of eval given to pt.  Educated on proper gait mechanics.  Educated on purpose of ionto with appropriate wear time.     Person(s) Educated Patient   Methods Explanation;Demonstration;Handout   Comprehension Verbalized understanding;Returned demonstration          PT Short Term Goals - 08/12/16 1009      PT SHORT TERM GOAL #1   Title Patient to experience pain as being no more than 2/10 in weight bearing positions in  order to improve QOL and work tolerance    Time 3   Period Weeks   Status New   Target Date 09/02/16     PT SHORT TERM GOAL #2   Title Patient to demonstrate an improvement of at least 10 degrees in B ankle dorsiflexion as well as inversion/eversion ROM as being equal between R and L feet in order to improve mechancis and pain    Time 3   Period Weeks   Status New     PT SHORT TERM GOAL #3   Title Patient to demonstrate improved gait mechanics including reduction of antalgic pattern and heel toe gait in order to improve mobility and walking tolerance    Time 3   Period Weeks   Status New     PT SHORT TERM GOAL #4   Title Patient to be compliant in correct performance of HEP, to be updated PRN    Time 1   Period Weeks   Status New   Target Date 08/19/16           PT Long Term Goals - 08/12/16 1013      PT LONG TERM GOAL #1   Title Patient to demonstrate functional strength as being 5/5 in all tested groups in order to improve mechanics and reduce pain    Time 6   Period Weeks   Status New   Target Date 09/23/16     PT LONG TERM GOAL #2   Title Patient to be able to stand and walk for at least 4 hours without pain in order to improve work tolerance and QOL    Time 6   Period Weeks   Status New     PT LONG TERM GOAL #3   Title Patient to report that she has been able to return to regular walking and exercise program without pain in order to improve QOL    Time 6   Period Weeks   Status New     PT LONG TERM GOAL #4   Title Patient to be compliant with advanced HEP to prevent recurrence of condition and maintain fucntional gains    Time 6   Period Weeks   Status New               Plan - 08/15/16 1043    Clinical Impression Statement Reviewed goals, assured compliance and proper form/technqiue wiht all HEP and copy of eval given to pt.  Session focus on ankle mobility exercises and manual techniques to reduce tightness for pain control.  Gait training with  cueing for equal stance phase and heel to toe sequence.  EOS with manual to address tightness  in gastroc/soleus complex and ended with ionto for pain control.  Pt reports vast reduction in pain to 3/10 was 7/10 at beginning of session.     Rehab Potential Good   Clinical Impairments Affecting Rehab Potential (+) success with PT in the past, motivated to participate; (-) recurrent condition, structural LLD, chronic hip and back pain    PT Frequency 2x / week   PT Duration 6 weeks   PT Treatment/Interventions ADLs/Self Care Home Management;Biofeedback;Cryotherapy;Electrical Stimulation;Iontophoresis 4mg /ml Dexamethasone;Moist Heat;Gait training;Stair training;Functional mobility training;Therapeutic activities;Therapeutic exercise;Balance training;Neuromuscular re-education;Patient/family education;Orthotic Fit/Training;Manual techniques   PT Next Visit Plan F/U on relief with ionto, continue trial ionto for 1 session per PT eval; manual and ankle/foot mobility exercises; gait training.  Next session add plantar fascia stretch to HEP.   PT Home Exercise Plan Eval: ankle circles, ankle alphabet, gastroc stretch, SKTC, corner stretch       Patient will benefit from skilled therapeutic intervention in order to improve the following deficits and impairments:  Abnormal gait, Pain, Decreased coordination, Decreased strength, Decreased range of motion, Decreased balance, Difficulty walking, Impaired flexibility  Visit Diagnosis: Pain in left ankle and joints of left foot  Pain in right ankle and joints of right foot  Difficulty in walking, not elsewhere classified  Other symptoms and signs involving the musculoskeletal system     Problem List Patient Active Problem List   Diagnosis Date Noted  . Yeast infection 03/13/2016  . Fibroids 03/13/2016  . Well woman exam with routine gynecological exam 11/28/2015  . Burning with urination 11/28/2015  . Subacute frontal sinusitis 11/28/2015  . Leg  cramps 10/31/2015  . Chest pain 10/31/2015  . Varicose 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 41/66/0630  . Body aches 11/21/2014  . Constipation 11/21/2014  . Vaginal itching 03/24/2014  . Vaginal discharge 03/24/2014  . Pelvic pain 11/02/2013  . Elevated cholesterol 11/02/2013  . Low back pain 10/20/2013  . Stiffness of ankle joint 10/20/2013  . Pain in joint, ankle and foot 10/20/2013  . Vaginal irritation 05/23/2013  . Hematuria 05/23/2013  . BV (bacterial vaginosis) 05/23/2013  . HEMATOCHEZIA 12/05/2009  . Dysphagia 12/05/2009  . CHEST WALL PAIN, ANTERIOR 06/23/2007  . LEG PAIN 05/18/2007  . VAGINAL PRURITUS 04/16/2007  . GOITER 03/10/2007  . HYPOTHYROIDISM 03/10/2007  . DIABETES MELLITUS, TYPE II 03/10/2007  . HYPERLIPIDEMIA 03/10/2007  . ANEMIA-IRON DEFICIENCY 03/10/2007  . HYPERTENSION 03/10/2007  . RHINITIS, CHRONIC 03/10/2007  . ASTHMA, CHILDHOOD 03/10/2007  . GERD 03/10/2007  . PEPTIC ULCER DISEASE 03/10/2007  . MENOPAUSAL SYNDROME 03/10/2007   Ihor Austin, [place]; [place]  Aldona Lento 08/15/2016, 12:23 PM  [facility] [place], [place], [address] Phone: [phone]   Fax:  [phone]  Name: Becky [name] MRN: [id number] Date of Birth: 1960-03-24

## 2016-08-20 ENCOUNTER — Ambulatory Visit (HOSPITAL_COMMUNITY): Payer: BLUE CROSS/BLUE SHIELD

## 2016-08-20 DIAGNOSIS — M25572 Pain in left ankle and joints of left foot: Secondary | ICD-10-CM | POA: Diagnosis not present

## 2016-08-20 DIAGNOSIS — R29898 Other symptoms and signs involving the musculoskeletal system: Secondary | ICD-10-CM

## 2016-08-20 DIAGNOSIS — M25571 Pain in right ankle and joints of right foot: Secondary | ICD-10-CM

## 2016-08-20 DIAGNOSIS — R262 Difficulty in walking, not elsewhere classified: Secondary | ICD-10-CM

## 2016-08-20 NOTE — Patient Instructions (Signed)
Plantar Fascia Stretch    Standing with only ball of left foot on stair, push heel down until stretch is felt through arch of foot. Hold 30 seconds. Relax. Repeat 3 times per set. Do 3 sets per session. Do 2 sessions per day.  http://orth.exer.us/23   Copyright  VHI. All rights reserved.

## 2016-08-20 NOTE — Therapy (Signed)
[place], [place], [address] Phone: [phone]   Fax:  [phone]  Physical Therapy Treatment  Patient Details  Name: Becky [name] MRN: [id number] Date of Birth: 02-18-1960 Referring Provider: Caprice Beaver   Encounter Date: 08/20/2016      PT End of Session - 08/20/16 1041    Visit Number 3   Number of Visits 13   Date for PT Re-Evaluation 09/02/16   Authorization Type BC[employer] Other    Authorization Time Period 08/12/16 to 09/23/16   PT Start Time 1036   PT Stop Time 1121   PT Time Calculation (min) 45 min   Activity Tolerance Patient tolerated treatment well   Behavior During Therapy [facility] for tasks assessed/performed      Past Medical History:  Diagnosis Date  . Back pain   . Body aches 11/21/2014  . BV (bacterial vaginosis) 05/23/2013  . Constipation 11/21/2014  . Elevated cholesterol 11/02/2013  . Fibroids 03/13/2016  . Hematuria 05/23/2013  . Hyperlipidemia   . Hypertension   . Migraines   . Pelvic pain in female 11/02/2013  . Plantar fasciitis of right foot   . Thyroid disease   . Vaginal discharge 03/24/2014  . Vaginal irritation 05/23/2013  . Vaginal itching 03/24/2014    Past Surgical History:  Procedure Laterality Date  . ECTOPIC PREGNANCY SURGERY    . ESOPHAGOGASTRODUODENOSCOPY  12/19/2009   HKV:[id number] stricture s/p dilation/mild gastritis  . ESOPHAGOGASTRODUODENOSCOPY N/A 12/10/2015   Procedure: ESOPHAGOGASTRODUODENOSCOPY (EGD);  Surgeon: Danie Binder, MD;  [facility];  Service: Endoscopy;  Laterality: N/A;  830  . ileocolonoscopy  12/19/2009   LOV:[id number] polyps/mild left-side diverticulosis/hemorrhoids  . KNEE SURGERY    . TUBAL LIGATION      There were no vitals filed for this visit.      Subjective Assessment - 08/20/16 1037    Subjective Pt reports she has increased pain and has began plantar fascia stretch at home with reports of relief, reports  benefits with ionto following last session for the rest of the day.     Pertinent History HTN, borderline DM, hypothyroidism, hx LBP, hx R knee surgery (plate and screws)   Patient Stated Goals get rid of pain, walk comfortably    Currently in Pain? Yes   Pain Score 5    Pain Location Foot   Pain Orientation Left  heel and lateral pain   Pain Descriptors / Indicators Aching   Pain Type Chronic pain   Pain Onset More than a month ago   Pain Frequency Intermittent   Aggravating Factors  standing, walking    Pain Relieving Factors ice   Effect of Pain on Daily Activities moderate                [facility] Adult PT Treatment/Exercise - 08/20/16 0001      Modalities   Modalities Iontophoresis     Iontophoresis   Type of Iontophoresis Dexamethasone   Location Lt heel   Time educated to keep on 4 hours following tx     Manual Therapy   Manual Therapy Soft tissue mobilization   Manual therapy comments manual complete separate rest of tx   Soft tissue mobilization STM to gastroc/soleus complex and plantar fascia in prone with passive DF stretch     Ankle Exercises: Standing   Rocker Board 2 minutes  R/L   Heel Raises 10 reps   Toe Raise 10 reps     Ankle  Exercises: Stretches   Plantar Fascia Stretch 3 reps;30 seconds   Plantar Fascia Stretch Limitations heel off step   Slant Board Stretch 3 reps;30 seconds     Ankle Exercises: Seated   BAPS Sitting;Level 3;10 reps                  PT Short Term Goals - 08/12/16 1009      PT SHORT TERM GOAL #1   Title Patient to experience pain as being no more than 2/10 in weight bearing positions in order to improve QOL and work tolerance    Time 3   Period Weeks   Status New   Target Date 09/02/16     PT SHORT TERM GOAL #2   Title Patient to demonstrate an improvement of at least 10 degrees in B ankle dorsiflexion as well as inversion/eversion ROM as being equal between R and L feet in order to improve mechancis and  pain    Time 3   Period Weeks   Status New     PT SHORT TERM GOAL #3   Title Patient to demonstrate improved gait mechanics including reduction of antalgic pattern and heel toe gait in order to improve mobility and walking tolerance    Time 3   Period Weeks   Status New     PT SHORT TERM GOAL #4   Title Patient to be compliant in correct performance of HEP, to be updated PRN    Time 1   Period Weeks   Status New   Target Date 08/19/16           PT Long Term Goals - 08/12/16 1013      PT LONG TERM GOAL #1   Title Patient to demonstrate functional strength as being 5/5 in all tested groups in order to improve mechanics and reduce pain    Time 6   Period Weeks   Status New   Target Date 09/23/16     PT LONG TERM GOAL #2   Title Patient to be able to stand and walk for at least 4 hours without pain in order to improve work tolerance and QOL    Time 6   Period Weeks   Status New     PT LONG TERM GOAL #3   Title Patient to report that she has been able to return to regular walking and exercise program without pain in order to improve QOL    Time 6   Period Weeks   Status New     PT LONG TERM GOAL #4   Title Patient to be compliant with advanced HEP to prevent recurrence of condition and maintain fucntional gains    Time 6   Period Weeks   Status New               Plan - 08/20/16 1545    Clinical Impression Statement Pt reports vast improvements with pain reduction following last session manual and ionto.  Continued session focus with ankle mobility exercises, began some gastroc CKC strengthneing and manual techqniues to address tightness for pain control.  Pt able to demonstrate improved gait mechanics with verbal instructing herself heel to toe sequence and equal stance phase.  Added rocker board to improve stance phase wiht gait.  Reviewed compliance with HEP with additional plantar fascia stretch printout given to pt.  Received MD signed referal for shoe lift  to assist with leg discrepancy to improve gait mechanics.  EOS with manual to address  tightness in gastroc/soleus complex with trigger point noted to soleus.  Pt left with home ionto patch and encouraged to leave on for 4 hours following session.     Rehab Potential Good   Clinical Impairments Affecting Rehab Potential (+) success with PT in the past, motivated to participate; (-) recurrent condition, structural LLD, chronic hip and back pain    PT Frequency 2x / week   PT Duration 6 weeks   PT Treatment/Interventions ADLs/Self Care Home Management;Biofeedback;Cryotherapy;Electrical Stimulation;Iontophoresis 4mg /ml Dexamethasone;Moist Heat;Gait training;Stair training;Functional mobility training;Therapeutic activities;Therapeutic exercise;Balance training;Neuromuscular re-education;Patient/family education;Orthotic Fit/Training;Manual techniques   PT Next Visit Plan Next session continue with gastroc/plantar fascia stretch and add soleus.  Continue with manual to address gastroc/soleus tightness and ankle/foot mobiltiy exercises and gait training.  F/u on shoe lift.     PT Home Exercise Plan Eval: ankle circles, ankle alphabet, gastroc stretch, SKTC, corner stretch; plantar fascia stretch      Patient will benefit from skilled therapeutic intervention in order to improve the following deficits and impairments:  Abnormal gait, Pain, Decreased coordination, Decreased strength, Decreased range of motion, Decreased balance, Difficulty walking, Impaired flexibility  Visit Diagnosis: Pain in left ankle and joints of left foot  Pain in right ankle and joints of right foot  Difficulty in walking, not elsewhere classified  Other symptoms and signs involving the musculoskeletal system     Problem List Patient Active Problem List   Diagnosis Date Noted  . Yeast infection 03/13/2016  . Fibroids 03/13/2016  . Well woman exam with routine gynecological exam 11/28/2015  . Burning with urination  11/28/2015  . Subacute frontal sinusitis 11/28/2015  . Leg cramps 10/31/2015  . Chest pain 10/31/2015  . Varicose 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 48/01/6551  . Body aches 11/21/2014  . Constipation 11/21/2014  . Vaginal itching 03/24/2014  . Vaginal discharge 03/24/2014  . Pelvic pain 11/02/2013  . Elevated cholesterol 11/02/2013  . Low back pain 10/20/2013  . Stiffness of ankle joint 10/20/2013  . Pain in joint, ankle and foot 10/20/2013  . Vaginal irritation 05/23/2013  . Hematuria 05/23/2013  . BV (bacterial vaginosis) 05/23/2013  . HEMATOCHEZIA 12/05/2009  . Dysphagia 12/05/2009  . CHEST WALL PAIN, ANTERIOR 06/23/2007  . LEG PAIN 05/18/2007  . VAGINAL PRURITUS 04/16/2007  . GOITER 03/10/2007  . HYPOTHYROIDISM 03/10/2007  . DIABETES MELLITUS, TYPE II 03/10/2007  . HYPERLIPIDEMIA 03/10/2007  . ANEMIA-IRON DEFICIENCY 03/10/2007  . HYPERTENSION 03/10/2007  . RHINITIS, CHRONIC 03/10/2007  . ASTHMA, CHILDHOOD 03/10/2007  . GERD 03/10/2007  . PEPTIC ULCER DISEASE 03/10/2007  . MENOPAUSAL SYNDROME 03/10/2007   Ihor Austin, [place]; [place]  Aldona Lento 08/20/2016, 3:54 PM  [place], [place], [address] Phone: [phone]   Fax:  [phone]  Name: Becky [name] MRN: [id number] Date of Birth: [birth year]

## 2016-08-22 ENCOUNTER — Ambulatory Visit (HOSPITAL_COMMUNITY): Payer: BLUE CROSS/BLUE SHIELD

## 2016-08-22 DIAGNOSIS — M25572 Pain in left ankle and joints of left foot: Secondary | ICD-10-CM | POA: Diagnosis not present

## 2016-08-22 DIAGNOSIS — R262 Difficulty in walking, not elsewhere classified: Secondary | ICD-10-CM

## 2016-08-22 DIAGNOSIS — M25571 Pain in right ankle and joints of right foot: Secondary | ICD-10-CM

## 2016-08-22 DIAGNOSIS — R29898 Other symptoms and signs involving the musculoskeletal system: Secondary | ICD-10-CM

## 2016-08-22 NOTE — Therapy (Signed)
[place], [place], [address] Phone: [phone]   Fax:  [phone]  Physical Therapy Treatment  Patient Details  Name: Becky [name] MRN: [id number] Date of Birth: 1960/10/23 Referring Provider: Caprice Beaver   Encounter Date: 08/22/2016      PT End of Session - 08/22/16 1155    Visit Number 4   Number of Visits 13   Date for PT Re-Evaluation 09/02/16   Authorization Type BCBS Other    Authorization Time Period 08/12/16 to 09/23/16   PT Start Time 1135   PT Stop Time 1200   PT Time Calculation (min) 25 min   Activity Tolerance Patient tolerated treatment well   Behavior During Therapy [facility] for tasks assessed/performed      Past Medical History:  Diagnosis Date  . Back pain   . Body aches 11/21/2014  . BV (bacterial vaginosis) 05/23/2013  . Constipation 11/21/2014  . Elevated cholesterol 11/02/2013  . Fibroids 03/13/2016  . Hematuria 05/23/2013  . Hyperlipidemia   . Hypertension   . Migraines   . Pelvic pain in female 11/02/2013  . Plantar fasciitis of right foot   . Thyroid disease   . Vaginal discharge 03/24/2014  . Vaginal irritation 05/23/2013  . Vaginal itching 03/24/2014    Past Surgical History:  Procedure Laterality Date  . ECTOPIC PREGNANCY SURGERY    . ESOPHAGOGASTRODUODENOSCOPY  12/19/2009   ONG:EXBMWU stricture s/p dilation/mild gastritis  . ESOPHAGOGASTRODUODENOSCOPY N/A 12/10/2015   Procedure: ESOPHAGOGASTRODUODENOSCOPY (EGD);  Surgeon: Danie Binder, MD;  [facility];  Service: Endoscopy;  Laterality: N/A;  830  . ileocolonoscopy  12/19/2009   XLK:[id number] polyps/mild left-side diverticulosis/hemorrhoids  . KNEE SURGERY    . TUBAL LIGATION      There were no vitals filed for this visit.      Subjective Assessment - 08/22/16 1136    Subjective Pt reports improved resolution of pain sinc elast session. She continutes to perform her stretches at home and at  work.    Pertinent History HTN, borderline DM, hypothyroidism, hx LBP, hx R knee surgery (plate and screws)   Currently in Pain? Yes   Pain Score 3    Pain Location Foot   Pain Orientation Left                         [facility] Adult PT Treatment/Exercise - 08/22/16 0001      Manual Therapy   Manual Therapy Joint mobilization   Joint Mobilization talocrurual distraction 3x60sec   Soft tissue mobilization Active Release Techniques to Left plantar fascia x5 minutes     Ankle Exercises: Standing   SLS 10x5sec bilat   Heel Raises --  2x15     Ankle Exercises: Stretches   Plantar Fascia Stretch 30 seconds;2 reps   Plantar Fascia Stretch Limitations rockboard                  PT Short Term Goals - 08/12/16 1009      PT SHORT TERM GOAL #1   Title Patient to experience pain as being no more than 2/10 in weight bearing positions in order to improve QOL and work tolerance    Time 3   Period Weeks   Status New   Target Date 09/02/16     PT SHORT TERM GOAL #2   Title Patient to demonstrate an improvement of at least 10 degrees in B ankle dorsiflexion as well as  inversion/eversion ROM as being equal between R and L feet in order to improve mechancis and pain    Time 3   Period Weeks   Status New     PT SHORT TERM GOAL #3   Title Patient to demonstrate improved gait mechanics including reduction of antalgic pattern and heel toe gait in order to improve mobility and walking tolerance    Time 3   Period Weeks   Status New     PT SHORT TERM GOAL #4   Title Patient to be compliant in correct performance of HEP, to be updated PRN    Time 1   Period Weeks   Status New   Target Date 08/19/16           PT Long Term Goals - 08/12/16 1013      PT LONG TERM GOAL #1   Title Patient to demonstrate functional strength as being 5/5 in all tested groups in order to improve mechanics and reduce pain    Time 6   Period Weeks   Status New   Target Date 09/23/16      PT LONG TERM GOAL #2   Title Patient to be able to stand and walk for at least 4 hours without pain in order to improve work tolerance and QOL    Time 6   Period Weeks   Status New     PT LONG TERM GOAL #3   Title Patient to report that she has been able to return to regular walking and exercise program without pain in order to improve QOL    Time 6   Period Weeks   Status New     PT LONG TERM GOAL #4   Title Patient to be compliant with advanced HEP to prevent recurrence of condition and maintain fucntional gains    Time 6   Period Weeks   Status New               Plan - 08/22/16 1156    Clinical Impression Statement Pt asrrived very late, hence treatment time is limited. Focuson manual therapy and triceps surae strength/stretching. Pt making progress toward goals overall.    Clinical Impairments Affecting Rehab Potential (+) success with PT in the past, motivated to participate; (-) recurrent condition, structural LLD, chronic hip and back pain    PT Frequency 2x / week   PT Duration 6 weeks   PT Treatment/Interventions ADLs/Self Care Home Management;Biofeedback;Cryotherapy;Electrical Stimulation;Iontophoresis 4mg /ml Dexamethasone;Moist Heat;Gait training;Stair training;Functional mobility training;Therapeutic activities;Therapeutic exercise;Balance training;Neuromuscular re-education;Patient/family education;Orthotic Fit/Training;Manual techniques   PT Next Visit Plan Continue with current program, more SLS on foam, + toe extension in stance, consider adding band eversion to HEP   PT Home Exercise Plan Eval: ankle circles, ankle alphabet, gastroc stretch, SKTC, corner stretch; plantar fascia stretch   Consulted and Agree with Plan of Care Patient      Patient will benefit from skilled therapeutic intervention in order to improve the following deficits and impairments:  Abnormal gait, Pain, Decreased coordination, Decreased strength, Decreased range of motion, Decreased  balance, Difficulty walking, Impaired flexibility  Visit Diagnosis: Pain in left ankle and joints of left foot  Pain in right ankle and joints of right foot  Difficulty in walking, not elsewhere classified  Other symptoms and signs involving the musculoskeletal system     Problem List Patient Active Problem List   Diagnosis Date Noted  . Yeast infection 03/13/2016  . Fibroids 03/13/2016  . Well woman exam with  routine gynecological exam 11/28/2015  . Burning with urination 11/28/2015  . Subacute frontal sinusitis 11/28/2015  . Leg cramps 10/31/2015  . Chest pain 10/31/2015  . Varicose 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 79/03/8331  . Body aches 11/21/2014  . Constipation 11/21/2014  . Vaginal itching 03/24/2014  . Vaginal discharge 03/24/2014  . Pelvic pain 11/02/2013  . Elevated cholesterol 11/02/2013  . Low back pain 10/20/2013  . Stiffness of ankle joint 10/20/2013  . Pain in joint, ankle and foot 10/20/2013  . Vaginal irritation 05/23/2013  . Hematuria 05/23/2013  . BV (bacterial vaginosis) 05/23/2013  . HEMATOCHEZIA 12/05/2009  . Dysphagia 12/05/2009  . CHEST WALL PAIN, ANTERIOR 06/23/2007  . LEG PAIN 05/18/2007  . VAGINAL PRURITUS 04/16/2007  . GOITER 03/10/2007  . HYPOTHYROIDISM 03/10/2007  . DIABETES MELLITUS, TYPE II 03/10/2007  . HYPERLIPIDEMIA 03/10/2007  . ANEMIA-IRON DEFICIENCY 03/10/2007  . HYPERTENSION 03/10/2007  . RHINITIS, CHRONIC 03/10/2007  . ASTHMA, CHILDHOOD 03/10/2007  . GERD 03/10/2007  . PEPTIC ULCER DISEASE 03/10/2007  . MENOPAUSAL SYNDROME 03/10/2007    Anissa Abbs C 08/22/2016, 12:07 PM   12:10 PM, 08/22/16 Etta Grandchild, PT, DPT Physical Therapist at [place] [phone] (office)       [place] [address] [place], [place], [address] Phone: [phone]   Fax:  [phone]  Name: Becky [name] MRN: [id number] Date of  Birth: 02/03/1960

## 2016-08-27 ENCOUNTER — Ambulatory Visit (HOSPITAL_COMMUNITY): Payer: BLUE CROSS/BLUE SHIELD

## 2016-08-27 DIAGNOSIS — M25571 Pain in right ankle and joints of right foot: Secondary | ICD-10-CM

## 2016-08-27 DIAGNOSIS — M25572 Pain in left ankle and joints of left foot: Secondary | ICD-10-CM | POA: Diagnosis not present

## 2016-08-27 DIAGNOSIS — R262 Difficulty in walking, not elsewhere classified: Secondary | ICD-10-CM

## 2016-08-27 DIAGNOSIS — R29898 Other symptoms and signs involving the musculoskeletal system: Secondary | ICD-10-CM

## 2016-08-27 NOTE — Therapy (Signed)
[facility] [place], [place], [address] Phone: [phone]   Fax:  [phone]  Physical Therapy Treatment  Patient Details  Name: Becky [name] MRN: [id number] Date of Birth: October 06, 1960 Referring Provider: Caprice Beaver   Encounter Date: 08/27/2016      PT End of Session - 08/27/16 0912    Visit Number 5   Number of Visits 13   Date for PT Re-Evaluation 09/02/16   Authorization Type BCBS Other    Authorization Time Period 08/12/16 to 09/23/16   PT Start Time 0906   PT Stop Time 0946   PT Time Calculation (min) 40 min   Activity Tolerance Patient tolerated treatment well;No increased pain   Behavior During Therapy WFL for tasks assessed/performed      Past Medical History:  Diagnosis Date  . Back pain   . Body aches 11/21/2014  . BV (bacterial vaginosis) 05/23/2013  . Constipation 11/21/2014  . Elevated cholesterol 11/02/2013  . Fibroids 03/13/2016  . Hematuria 05/23/2013  . Hyperlipidemia   . Hypertension   . Migraines   . Pelvic pain in female 11/02/2013  . Plantar fasciitis of right foot   . Thyroid disease   . Vaginal discharge 03/24/2014  . Vaginal irritation 05/23/2013  . Vaginal itching 03/24/2014    Past Surgical History:  Procedure Laterality Date  . ECTOPIC PREGNANCY SURGERY    . ESOPHAGOGASTRODUODENOSCOPY  12/19/2009   JSE:GBTDVV stricture s/p dilation/mild gastritis  . ESOPHAGOGASTRODUODENOSCOPY N/A 12/10/2015   Procedure: ESOPHAGOGASTRODUODENOSCOPY (EGD);  Surgeon: Danie Binder, MD;  [facility];  Service: Endoscopy;  Laterality: N/A;  830  . ileocolonoscopy  12/19/2009   OHY:[id number] polyps/mild left-side diverticulosis/hemorrhoids  . KNEE SURGERY    . TUBAL LIGATION      There were no vitals filed for this visit.      Subjective Assessment - 08/27/16 0910    Subjective Pt stated she got massage last weekend and reports a lot of relief following.  Arrived wearing new  pair of tennis shoes and reports relief.  Continues to perform her stretches multiple times a day at home and work   Pertinent History HTN, borderline DM, hypothyroidism, hx LBP, hx R knee surgery (plate and screws)   Patient Stated Goals get rid of pain, walk comfortably    Currently in Pain? No/denies                         [facility] Adult PT Treatment/Exercise - 08/27/16 0001      Manual Therapy   Manual Therapy Soft tissue mobilization   Manual therapy comments manual complete separate rest of tx   Soft tissue mobilization gastroc/soleux complex and plantar fascia in prone     Ankle Exercises: Standing   SLS Lt 19" Rt 26"   Heel Raises 15 reps  2 sets incline slope minimal HHA   Other Standing Ankle Exercises SLS on foam toe extension 10 x5" holds     Ankle Exercises: Stretches   Plantar Fascia Stretch 3 reps;30 seconds   Soleus Stretch 3 reps;30 seconds  slant board   Slant Board Stretch 3 reps;30 seconds                  PT Short Term Goals - 08/12/16 1009      PT SHORT TERM GOAL #1   Title Patient to experience pain as being no more than 2/10 in weight bearing positions in order to  improve QOL and work tolerance    Time 3   Period Weeks   Status New   Target Date 09/02/16     PT SHORT TERM GOAL #2   Title Patient to demonstrate an improvement of at least 10 degrees in B ankle dorsiflexion as well as inversion/eversion ROM as being equal between R and L feet in order to improve mechancis and pain    Time 3   Period Weeks   Status New     PT SHORT TERM GOAL #3   Title Patient to demonstrate improved gait mechanics including reduction of antalgic pattern and heel toe gait in order to improve mobility and walking tolerance    Time 3   Period Weeks   Status New     PT SHORT TERM GOAL #4   Title Patient to be compliant in correct performance of HEP, to be updated PRN    Time 1   Period Weeks   Status New   Target Date 08/19/16            PT Long Term Goals - 08/12/16 1013      PT LONG TERM GOAL #1   Title Patient to demonstrate functional strength as being 5/5 in all tested groups in order to improve mechanics and reduce pain    Time 6   Period Weeks   Status New   Target Date 09/23/16     PT LONG TERM GOAL #2   Title Patient to be able to stand and walk for at least 4 hours without pain in order to improve work tolerance and QOL    Time 6   Period Weeks   Status New     PT LONG TERM GOAL #3   Title Patient to report that she has been able to return to regular walking and exercise program without pain in order to improve QOL    Time 6   Period Weeks   Status New     PT LONG TERM GOAL #4   Title Patient to be compliant with advanced HEP to prevent recurrence of condition and maintain fucntional gains    Time 6   Period Weeks   Status New               Plan - 08/27/16 1014    Clinical Impression Statement Pt progressing well towards goals with reports of minimal pain and compliance with HEP stretches daily.  Added foam SLS for ankle stability with additional toe extension strengthening to improve gait mechanics and ankle mobilty.  Additional soleus stretch to address tightness.  EOS with manual techqnies to address tightness gastroc/soleus complex.  No reports of pain through session.  Pt did report increased difficulty with reciprocal pattern stairs, will address in sessions following   Rehab Potential Good   Clinical Impairments Affecting Rehab Potential (+) success with PT in the past, motivated to participate; (-) recurrent condition, structural LLD, chronic hip and back pain    PT Frequency 2x / week   PT Duration 6 weeks   PT Treatment/Interventions ADLs/Self Care Home Management;Biofeedback;Cryotherapy;Electrical Stimulation;Iontophoresis 4mg /ml Dexamethasone;Moist Heat;Gait training;Stair training;Functional mobility training;Therapeutic activities;Therapeutic exercise;Balance  training;Neuromuscular re-education;Patient/family education;Orthotic Fit/Training;Manual techniques   PT Next Visit Plan Continue with current program, more SLS on foam, + toe extension in stance, consider adding band eversion to HEP.  Begin reciprocal stair training when appropriate.   PT Home Exercise Plan Eval: ankle circles, ankle alphabet, gastroc stretch, SKTC, corner stretch; plantar fascia stretch  Patient will benefit from skilled therapeutic intervention in order to improve the following deficits and impairments:  Abnormal gait, Pain, Decreased coordination, Decreased strength, Decreased range of motion, Decreased balance, Difficulty walking, Impaired flexibility  Visit Diagnosis: Pain in left ankle and joints of left foot  Pain in right ankle and joints of right foot  Difficulty in walking, not elsewhere classified  Other symptoms and signs involving the musculoskeletal system     Problem List Patient Active Problem List   Diagnosis Date Noted  . Yeast infection 03/13/2016  . Fibroids 03/13/2016  . Well woman exam with routine gynecological exam 11/28/2015  . Burning with urination 11/28/2015  . Subacute frontal sinusitis 11/28/2015  . Leg cramps 10/31/2015  . Chest pain 10/31/2015  . Varicose 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 82/95/6213  . Body aches 11/21/2014  . Constipation 11/21/2014  . Vaginal itching 03/24/2014  . Vaginal discharge 03/24/2014  . Pelvic pain 11/02/2013  . Elevated cholesterol 11/02/2013  . Low back pain 10/20/2013  . Stiffness of ankle joint 10/20/2013  . Pain in joint, ankle and foot 10/20/2013  . Vaginal irritation 05/23/2013  . Hematuria 05/23/2013  . BV (bacterial vaginosis) 05/23/2013  . HEMATOCHEZIA 12/05/2009  . Dysphagia 12/05/2009  . CHEST WALL PAIN, ANTERIOR 06/23/2007  . LEG PAIN 05/18/2007  . VAGINAL PRURITUS 04/16/2007  . GOITER 03/10/2007  . HYPOTHYROIDISM 03/10/2007  . DIABETES MELLITUS, TYPE II  03/10/2007  . HYPERLIPIDEMIA 03/10/2007  . ANEMIA-IRON DEFICIENCY 03/10/2007  . HYPERTENSION 03/10/2007  . RHINITIS, CHRONIC 03/10/2007  . ASTHMA, CHILDHOOD 03/10/2007  . GERD 03/10/2007  . PEPTIC ULCER DISEASE 03/10/2007  . MENOPAUSAL SYNDROME 03/10/2007   Ihor Austin, [place]; [place]  Aldona Lento 08/27/2016, 12:03 PM  [facility], [place], [address] Phone: [phone]   Fax:  [phone]  Name: Becky [name] MRN: [id number] Date of Birth: 1960/03/29

## 2016-08-29 ENCOUNTER — Encounter (HOSPITAL_COMMUNITY): Payer: Self-pay

## 2016-08-29 ENCOUNTER — Ambulatory Visit (HOSPITAL_COMMUNITY): Payer: BLUE CROSS/BLUE SHIELD

## 2016-08-29 DIAGNOSIS — R262 Difficulty in walking, not elsewhere classified: Secondary | ICD-10-CM

## 2016-08-29 DIAGNOSIS — M25571 Pain in right ankle and joints of right foot: Secondary | ICD-10-CM

## 2016-08-29 DIAGNOSIS — M25572 Pain in left ankle and joints of left foot: Secondary | ICD-10-CM | POA: Diagnosis not present

## 2016-08-29 DIAGNOSIS — R29898 Other symptoms and signs involving the musculoskeletal system: Secondary | ICD-10-CM

## 2016-08-29 NOTE — Therapy (Signed)
[facility] [place], [place], [address] Phone: [phone]   Fax:  [phone]  Physical Therapy Treatment  Patient Details  Name: Becky [name] MRN: [id number] Date of Birth: Apr 28, 1960 Referring Provider: Caprice Beaver   Encounter Date: 08/29/2016      PT End of Session - 08/29/16 1039    Visit Number 6   Number of Visits 13   Date for PT Re-Evaluation 09/02/16   Authorization Type BC[employer] Other    Authorization Time Period 08/12/16 to 09/23/16   PT Start Time 1040  pt arrived late   PT Stop Time 1114   PT Time Calculation (min) 34 min   Activity Tolerance Patient tolerated treatment well;No increased pain   Behavior During Therapy WFL for tasks assessed/performed      Past Medical History:  Diagnosis Date  . Back pain   . Body aches 11/21/2014  . BV (bacterial vaginosis) 05/23/2013  . Constipation 11/21/2014  . Elevated cholesterol 11/02/2013  . Fibroids 03/13/2016  . Hematuria 05/23/2013  . Hyperlipidemia   . Hypertension   . Migraines   . Pelvic pain in female 11/02/2013  . Plantar fasciitis of right foot   . Thyroid disease   . Vaginal discharge 03/24/2014  . Vaginal irritation 05/23/2013  . Vaginal itching 03/24/2014    Past Surgical History:  Procedure Laterality Date  . ECTOPIC PREGNANCY SURGERY    . ESOPHAGOGASTRODUODENOSCOPY  12/19/2009   FTD:DUKGUR stricture s/p dilation/mild gastritis  . ESOPHAGOGASTRODUODENOSCOPY N/A 12/10/2015   Procedure: ESOPHAGOGASTRODUODENOSCOPY (EGD);  Surgeon: Danie Binder, MD;  [facility];  Service: Endoscopy;  Laterality: N/A;  830  . ileocolonoscopy  12/19/2009   KY[facility]:[id number] polyps/mild left-side diverticulosis/hemorrhoids  . KNEE SURGERY    . TUBAL LIGATION      There were no vitals filed for this visit.      Subjective Assessment - 08/29/16 1040    Subjective Pt states that she is stiff and reports a little tenderness in her L heel. But  overall she feels she is getting better.   Pertinent History HTN, borderline DM, hypothyroidism, hx LBP, hx R knee surgery (plate and screws)   Patient Stated Goals get rid of pain, walk comfortably    Currently in Pain? Yes   Pain Score 5    Pain Location Foot   Pain Orientation Left   Pain Descriptors / Indicators Tender   Pain Type Chronic pain   Pain Onset More than a month ago   Pain Frequency Intermittent   Aggravating Factors  standing, walking   Pain Relieving Factors ice   Effect of Pain on Daily Activities moderate               [facility] Adult PT Treatment/Exercise - 08/29/16 0001      Manual Therapy   Manual Therapy Soft tissue mobilization   Manual therapy comments manual complete separate rest of tx   Soft tissue mobilization gastroc/soleux complex and plantar fascia in prone     Ankle Exercises: Stretches   Plantar Fascia Stretch 3 reps;30 seconds   Soleus Stretch 3 reps;30 seconds  slant board     Ankle Exercises: Standing   Heel Raises 20 reps  2 sets   Other Standing Ankle Exercises SLS on foam toe extension 10 x5" holds; SLS on foam and palov press with RTB 2x10 reps               PT Education - 08/29/16 1114  Education provided Yes   Education Details continue HEP   Person(s) Educated Patient   Methods Explanation;Demonstration   Comprehension Verbalized understanding;Returned demonstration          PT Short Term Goals - 08/12/16 1009      PT SHORT TERM GOAL #1   Title Patient to experience pain as being no more than 2/10 in weight bearing positions in order to improve QOL and work tolerance    Time 3   Period Weeks   Status New   Target Date 09/02/16     PT SHORT TERM GOAL #2   Title Patient to demonstrate an improvement of at least 10 degrees in B ankle dorsiflexion as well as inversion/eversion ROM as being equal between R and L feet in order to improve mechancis and pain    Time 3   Period Weeks   Status New     PT SHORT  TERM GOAL #3   Title Patient to demonstrate improved gait mechanics including reduction of antalgic pattern and heel toe gait in order to improve mobility and walking tolerance    Time 3   Period Weeks   Status New     PT SHORT TERM GOAL #4   Title Patient to be compliant in correct performance of HEP, to be updated PRN    Time 1   Period Weeks   Status New   Target Date 08/19/16           PT Long Term Goals - 08/12/16 1013      PT LONG TERM GOAL #1   Title Patient to demonstrate functional strength as being 5/5 in all tested groups in order to improve mechanics and reduce pain    Time 6   Period Weeks   Status New   Target Date 09/23/16     PT LONG TERM GOAL #2   Title Patient to be able to stand and walk for at least 4 hours without pain in order to improve work tolerance and QOL    Time 6   Period Weeks   Status New     PT LONG TERM GOAL #3   Title Patient to report that she has been able to return to regular walking and exercise program without pain in order to improve QOL    Time 6   Period Weeks   Status New     PT LONG TERM GOAL #4   Title Patient to be compliant with advanced HEP to prevent recurrence of condition and maintain fucntional gains    Time 6   Period Weeks   Status New               Plan - 08/29/16 1118    Clinical Impression Statement Session limited as pt arrived late. Continued POC as planned and performed stretching and more balance activities on foam and then ended session with manual. Pt reported 0/10 pain at EOS. Pt mentioned that she tried to get a lift for her shoe but she didn't have any measurements of her LLD. PT measured pt from ASIS to medial malleoli this date and she had 37" L leg and 36.75" on R. Continue POC as planned.   Rehab Potential Good   Clinical Impairments Affecting Rehab Potential (+) success with PT in the past, motivated to participate; (-) recurrent condition, structural LLD, chronic hip and back pain    PT  Frequency 2x / week   PT Duration 6 weeks   PT Treatment/Interventions  ADLs/Self Care Home Management;Biofeedback;Cryotherapy;Electrical Stimulation;Iontophoresis 4mg /ml Dexamethasone;Moist Heat;Gait training;Stair training;Functional mobility training;Therapeutic activities;Therapeutic exercise;Balance training;Neuromuscular re-education;Patient/family education;Orthotic Fit/Training;Manual techniques   PT Next Visit Plan Continue with current program, more SLS on foam, + toe extension in stance, consider adding band eversion to HEP.  Begin reciprocal stair training when appropriate; trial eccentric PF strengthening and or isometric PF for pain control   PT Home Exercise Plan Eval: ankle circles, ankle alphabet, gastroc stretch, SKTC, corner stretch; plantar fascia stretch   Consulted and Agree with Plan of Care Patient      Patient will benefit from skilled therapeutic intervention in order to improve the following deficits and impairments:  Abnormal gait, Pain, Decreased coordination, Decreased strength, Decreased range of motion, Decreased balance, Difficulty walking, Impaired flexibility  Visit Diagnosis: Pain in left ankle and joints of left foot  Pain in right ankle and joints of right foot  Difficulty in walking, not elsewhere classified  Other symptoms and signs involving the musculoskeletal system     Problem List Patient Active Problem List   Diagnosis Date Noted  . Yeast infection 03/13/2016  . Fibroids 03/13/2016  . Well woman exam with routine gynecological exam 11/28/2015  . Burning with urination 11/28/2015  . Subacute frontal sinusitis 11/28/2015  . Leg cramps 10/31/2015  . Chest pain 10/31/2015  . Varicose 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 20/35/5974  . Body aches 11/21/2014  . Constipation 11/21/2014  . Vaginal itching 03/24/2014  . Vaginal discharge 03/24/2014  . Pelvic pain 11/02/2013  . Elevated cholesterol 11/02/2013  . Low back pain  10/20/2013  . Stiffness of ankle joint 10/20/2013  . Pain in joint, ankle and foot 10/20/2013  . Vaginal irritation 05/23/2013  . Hematuria 05/23/2013  . BV (bacterial vaginosis) 05/23/2013  . HEMATOCHEZIA 12/05/2009  . Dysphagia 12/05/2009  . CHEST WALL PAIN, ANTERIOR 06/23/2007  . LEG PAIN 05/18/2007  . VAGINAL PRURITUS 04/16/2007  . GOITER 03/10/2007  . HYPOTHYROIDISM 03/10/2007  . DIABETES MELLITUS, TYPE II 03/10/2007  . HYPERLIPIDEMIA 03/10/2007  . ANEMIA-IRON DEFICIENCY 03/10/2007  . HYPERTENSION 03/10/2007  . RHINITIS, CHRONIC 03/10/2007  . ASTHMA, CHILDHOOD 03/10/2007  . GERD 03/10/2007  . PEPTIC ULCER DISEASE 03/10/2007  . MENOPAUSAL SYNDROME 03/10/2007       Geraldine Solar PT, DPT   [facility] [address] [place], [place], [address] Phone: [phone]   Fax:  [phone]  Name: Becky [name] MRN: [id number] Date of Birth: 1960-04-05

## 2016-09-01 ENCOUNTER — Ambulatory Visit (HOSPITAL_COMMUNITY): Payer: BLUE CROSS/BLUE SHIELD | Admitting: Physical Therapy

## 2016-09-01 ENCOUNTER — Telehealth (HOSPITAL_COMMUNITY): Payer: Self-pay | Admitting: Physical Therapy

## 2016-09-01 NOTE — Telephone Encounter (Signed)
No-show. Called and left voicemail regarding this no-show as well as time/date of next scheduled session.  Deniece Ree PT, DPT [phone]

## 2016-09-04 ENCOUNTER — Ambulatory Visit (HOSPITAL_COMMUNITY): Payer: BLUE CROSS/BLUE SHIELD | Admitting: Physical Therapy

## 2016-09-04 DIAGNOSIS — M25572 Pain in left ankle and joints of left foot: Secondary | ICD-10-CM | POA: Diagnosis not present

## 2016-09-04 DIAGNOSIS — M25571 Pain in right ankle and joints of right foot: Secondary | ICD-10-CM

## 2016-09-04 DIAGNOSIS — R262 Difficulty in walking, not elsewhere classified: Secondary | ICD-10-CM

## 2016-09-04 DIAGNOSIS — R29898 Other symptoms and signs involving the musculoskeletal system: Secondary | ICD-10-CM

## 2016-09-04 NOTE — Therapy (Addendum)
[place], [place], [address] Phone: [phone]   Fax:  [phone]  Physical Therapy Treatment  Patient Details  Name: Becky [name] MRN: [id number] Date of Birth: 02-26-1960 Referring Provider: Caprice Beaver   Encounter Date: 09/04/2016      PT End of Session - 09/04/16 1000    Visit Number 7   Number of Visits 13   Date for PT Re-Evaluation 09/02/16   Authorization Type [employer] Other    Authorization Time Period 08/12/16 to 09/23/16   PT Start Time 0905   PT Stop Time 0950   PT Time Calculation (min) 45 min   Activity Tolerance Patient tolerated treatment well;No increased pain   Behavior During Therapy WFL for tasks assessed/performed      Past Medical History:  Diagnosis Date  . Back pain   . Body aches 11/21/2014  . BV (bacterial vaginosis) 05/23/2013  . Constipation 11/21/2014  . Elevated cholesterol 11/02/2013  . Fibroids 03/13/2016  . Hematuria 05/23/2013  . Hyperlipidemia   . Hypertension   . Migraines   . Pelvic pain in female 11/02/2013  . Plantar fasciitis of right foot   . Thyroid disease   . Vaginal discharge 03/24/2014  . Vaginal irritation 05/23/2013  . Vaginal itching 03/24/2014    Past Surgical History:  Procedure Laterality Date  . ECTOPIC PREGNANCY SURGERY    . ESOPHAGOGASTRODUODENOSCOPY  12/19/2009   XLK:GMWNUU stricture s/p dilation/mild gastritis  . ESOPHAGOGASTRODUODENOSCOPY N/A 12/10/2015   Procedure: ESOPHAGOGASTRODUODENOSCOPY (EGD);  Surgeon: Danie Binder, MD;  [facility];  Service: Endoscopy;  Laterality: N/A;  830  . ileocolonoscopy  12/19/2009   VOZ:[id number] polyps/mild left-side diverticulosis/hemorrhoids  . KNEE SURGERY    . TUBAL LIGATION      There were no vitals filed for this visit.      Subjective Assessment - 09/04/16 0908    Subjective PT states her foot is hurting today.  STates she does alot of standing and stairs at work and has  custom insoles in her steal toe boots but still hurts.  Currently 6/10 Lt heel   Currently in Pain? Yes   Pain Score 6    Pain Location Foot   Pain Orientation Left   Pain Descriptors / Indicators Tender                         [facility] Adult PT Treatment/Exercise - 09/04/16 0001      Ambulation/Gait   Gait Comments palov press with RTB on flat surface 10 reps each LE     Manual Therapy   Manual Therapy Soft tissue mobilization   Manual therapy comments manual complete separate rest of tx   Soft tissue mobilization gastroc/soleux complex and plantar fascia in prone     Ankle Exercises: Standing   Vector Stance Right;Left;5 reps;5 seconds;Limitations  on foam   Heel Raises 10 reps  single leg each LE with eccentric control    Heel Walk (Round Trip) 1RT   Toe Walk (Round Trip) 1RT   Other Standing Ankle Exercises SLS on foam 30" each   Other Standing Ankle Exercises forward step downs with eccentric control 10 reps each 6" step     Ankle Exercises: Stretches   Plantar Fascia Stretch 3 reps;30 seconds   Soleus Stretch 3 reps;30 seconds   Slant Board Stretch 3 reps;30 seconds  PT Short Term Goals - 08/12/16 1009      PT SHORT TERM GOAL #1   Title Patient to experience pain as being no more than 2/10 in weight bearing positions in order to improve QOL and work tolerance    Time 3   Period Weeks   Status New   Target Date 09/02/16     PT SHORT TERM GOAL #2   Title Patient to demonstrate an improvement of at least 10 degrees in B ankle dorsiflexion as well as inversion/eversion ROM as being equal between R and L feet in order to improve mechancis and pain    Time 3   Period Weeks   Status New     PT SHORT TERM GOAL #3   Title Patient to demonstrate improved gait mechanics including reduction of antalgic pattern and heel toe gait in order to improve mobility and walking tolerance    Time 3   Period Weeks   Status New     PT SHORT  TERM GOAL #4   Title Patient to be compliant in correct performance of HEP, to be updated PRN    Time 1   Period Weeks   Status New   Target Date 08/19/16           PT Long Term Goals - 08/12/16 1013      PT LONG TERM GOAL #1   Title Patient to demonstrate functional strength as being 5/5 in all tested groups in order to improve mechanics and reduce pain    Time 6   Period Weeks   Status New   Target Date 09/23/16     PT LONG TERM GOAL #2   Title Patient to be able to stand and walk for at least 4 hours without pain in order to improve work tolerance and QOL    Time 6   Period Weeks   Status New     PT LONG TERM GOAL #3   Title Patient to report that she has been able to return to regular walking and exercise program without pain in order to improve QOL    Time 6   Period Weeks   Status New     PT LONG TERM GOAL #4   Title Patient to be compliant with advanced HEP to prevent recurrence of condition and maintain fucntional gains    Time 6   Period Weeks   Status New               Plan - 09/04/16 1146    Clinical Impression Statement Pt with elevated pain this session as compared to last session.  Continued with established strengthening and stabilty therex.  Completed palov press without foam with improved stability noted.  progressed to heel and toewalking with noted challenge as well as single leg heelraises with eccentric control.  Pt with more difficulty maintaining SLS on Rt LE as compared to Lt.  Manual completed at end of session with noted tigthness in gastroc complex and multiple spasms throughout plantar fascia.    Rehab Potential Good   Clinical Impairments Affecting Rehab Potential (+) success with PT in the past, motivated to participate; (-) recurrent condition, structural LLD, chronic hip and back pain    PT Frequency 2x / week   PT Duration 6 weeks   PT Treatment/Interventions ADLs/Self Care Home Management;Biofeedback;Cryotherapy;Electrical  Stimulation;Iontophoresis 4mg /ml Dexamethasone;Moist Heat;Gait training;Stair training;Functional mobility training;Therapeutic activities;Therapeutic exercise;Balance training;Neuromuscular re-education;Patient/family education;Orthotic Fit/Training;Manual techniques   PT Next Visit Plan Continue with current  program, more SLS on foam, + toe extension in stance, consider adding band eversion to HEP.  Begin reciprocal stair training when appropriate; trial eccentric PF strengthening and or isometric PF for pain control   PT Home Exercise Plan Eval: ankle circles, ankle alphabet, gastroc stretch, SKTC, corner stretch; plantar fascia stretch   Consulted and Agree with Plan of Care Patient      Patient will benefit from skilled therapeutic intervention in order to improve the following deficits and impairments:  Abnormal gait, Pain, Decreased coordination, Decreased strength, Decreased range of motion, Decreased balance, Difficulty walking, Impaired flexibility  Visit Diagnosis: Pain in left ankle and joints of left foot  Pain in right ankle and joints of right foot  Difficulty in walking, not elsewhere classified  Other symptoms and signs involving the musculoskeletal system     Problem List Patient Active Problem List   Diagnosis Date Noted  . Yeast infection 03/13/2016  . Fibroids 03/13/2016  . Well woman exam with routine gynecological exam 11/28/2015  . Burning with urination 11/28/2015  . Subacute frontal sinusitis 11/28/2015  . Leg cramps 10/31/2015  . Chest pain 10/31/2015  . Varicose 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 86/76/1950  . Body aches 11/21/2014  . Constipation 11/21/2014  . Vaginal itching 03/24/2014  . Vaginal discharge 03/24/2014  . Pelvic pain 11/02/2013  . Elevated cholesterol 11/02/2013  . Low back pain 10/20/2013  . Stiffness of ankle joint 10/20/2013  . Pain in joint, ankle and foot 10/20/2013  . Vaginal irritation 05/23/2013  . Hematuria  05/23/2013  . BV (bacterial vaginosis) 05/23/2013  . HEMATOCHEZIA 12/05/2009  . Dysphagia 12/05/2009  . CHEST WALL PAIN, ANTERIOR 06/23/2007  . LEG PAIN 05/18/2007  . VAGINAL PRURITUS 04/16/2007  . GOITER 03/10/2007  . HYPOTHYROIDISM 03/10/2007  . DIABETES MELLITUS, TYPE II 03/10/2007  . HYPERLIPIDEMIA 03/10/2007  . ANEMIA-IRON DEFICIENCY 03/10/2007  . HYPERTENSION 03/10/2007  . RHINITIS, CHRONIC 03/10/2007  . ASTHMA, CHILDHOOD 03/10/2007  . GERD 03/10/2007  . PEPTIC ULCER DISEASE 03/10/2007  . MENOPAUSAL SYNDROME 03/10/2007    Teena Irani, PTA/CLT [phone]   Teena Irani 09/04/2016, 12:01 PM  [facility] [place], [place], [address] Phone: [phone]   Fax:  [phone]  Name: Becky [name] MRN: [id number] Date of Birth: May 19, 1960

## 2016-09-12 ENCOUNTER — Ambulatory Visit (HOSPITAL_COMMUNITY): Payer: BLUE CROSS/BLUE SHIELD | Attending: Podiatry

## 2016-09-12 ENCOUNTER — Encounter (HOSPITAL_COMMUNITY): Payer: Self-pay

## 2016-09-12 DIAGNOSIS — M25571 Pain in right ankle and joints of right foot: Secondary | ICD-10-CM | POA: Diagnosis present

## 2016-09-12 DIAGNOSIS — R262 Difficulty in walking, not elsewhere classified: Secondary | ICD-10-CM

## 2016-09-12 DIAGNOSIS — M25572 Pain in left ankle and joints of left foot: Secondary | ICD-10-CM | POA: Diagnosis not present

## 2016-09-12 DIAGNOSIS — R29898 Other symptoms and signs involving the musculoskeletal system: Secondary | ICD-10-CM

## 2016-09-12 NOTE — Therapy (Signed)
[place], [place], [address] Phone: [phone]   Fax:  [phone]  Physical Therapy Treatment/Reassessment  Patient Details  Name: Becky [name] MRN: [id number] Date of Birth: 1960/12/06 Referring Provider: Caprice Beaver   Encounter Date: 09/12/2016      PT End of Session - 09/12/16 0903    Visit Number 8   Number of Visits 21   Date for PT Re-Evaluation 10/10/16   Authorization Type [employer] Other    Authorization Time Period 09/10/16 to 10/10/16   PT Start Time 0903   PT Stop Time 0939   PT Time Calculation (min) 36 min   Activity Tolerance Patient tolerated treatment well;No increased pain   Behavior During Therapy WFL for tasks assessed/performed      Past Medical History:  Diagnosis Date  . Back pain   . Body aches 11/21/2014  . BV (bacterial vaginosis) 05/23/2013  . Constipation 11/21/2014  . Elevated cholesterol 11/02/2013  . Fibroids 03/13/2016  . Hematuria 05/23/2013  . Hyperlipidemia   . Hypertension   . Migraines   . Pelvic pain in female 11/02/2013  . Plantar fasciitis of right foot   . Thyroid disease   . Vaginal discharge 03/24/2014  . Vaginal irritation 05/23/2013  . Vaginal itching 03/24/2014    Past Surgical History:  Procedure Laterality Date  . ECTOPIC PREGNANCY SURGERY    . ESOPHAGOGASTRODUODENOSCOPY  12/19/2009   OFB:PZWCHE stricture s/p dilation/mild gastritis  . ESOPHAGOGASTRODUODENOSCOPY N/A 12/10/2015   Procedure: ESOPHAGOGASTRODUODENOSCOPY (EGD);  Surgeon: Danie Binder, MD;  [facility];  Service: Endoscopy;  Laterality: N/A;  830  . ileocolonoscopy  12/19/2009   NID:[id number] polyps/mild left-side diverticulosis/hemorrhoids  . KNEE SURGERY    . TUBAL LIGATION      There were no vitals filed for this visit.      Subjective Assessment - 09/12/16 0904    Subjective Pt states that her foot and heel aren't bothering her much right now, but they will  about middday. She said that she's feeling it a little more in her ankle today and thinks it's because she switched up her shoes.   Currently in Pain? Yes   Pain Score 4    Pain Location Foot   Pain Orientation Left   Pain Descriptors / Indicators Tender   Pain Type Chronic pain   Pain Onset More than a month ago   Pain Frequency Intermittent   Aggravating Factors  standing, walking   Pain Relieving Factors ice   Effect of Pain on Daily Activities moderate            [facility] PT Assessment - 09/12/16 0001      AROM   Right Ankle Dorsiflexion -3  knee bent; was -3   Left Ankle Dorsiflexion 0  knee bent; was -3     Strength   Right Hip Extension 4/5  was 3+   Right Hip ABduction 4+/5  was 4+   Left Hip Extension 4/5  was 3   Left Hip ABduction 4+/5  was 3   Right Knee Flexion 5/5  was 4   Left Knee Flexion 5/5  was 4-   Left Knee Extension 5/5  was 4+   Left Ankle Inversion 5/5  was 4+   Left Ankle Eversion 5/5  was 4              [facility] Adult PT Treatment/Exercise - 09/12/16 0001      Ankle Exercises: Standing  Heel Raises 10 reps  BLE together 10" isometrics; single leg 5x10" each     Ankle Exercises: Stretches   Gastroc Stretch 3 reps;20 seconds  SL on step               PT Education - 09/12/16 0937    Education provided Yes   Education Details POC, continue HEP   Person(s) Educated Patient   Methods Explanation;Demonstration   Comprehension Verbalized understanding;Returned demonstration          PT Short Term Goals - 09/12/16 0907      PT SHORT TERM GOAL #1   Title Patient to experience pain as being no more than 2/10 in weight bearing positions in order to improve QOL and work tolerance    Baseline 9/7: pain averages about 8-9/10   Time 3   Period Weeks   Status On-going     PT SHORT TERM GOAL #2   Title Patient to demonstrate an improvement of at least 10 degrees in B ankle dorsiflexion as well as inversion/eversion ROM as  being equal between R and L feet in order to improve mechancis and pain    Baseline 9/7: L DF 0 deg, R DF -3   Time 3   Period Weeks   Status On-going     PT SHORT TERM GOAL #3   Title Patient to demonstrate improved gait mechanics including reduction of antalgic pattern and heel toe gait in order to improve mobility and walking tolerance    Baseline 9/7; antalgia remains and decreased heel to toe   Time 3   Period Weeks   Status On-going     PT SHORT TERM GOAL #4   Title Patient to be compliant in correct performance of HEP, to be updated PRN    Baseline 9/7: has been consistent with it until recently   Time 1   Period Weeks   Status Partially Met           PT Long Term Goals - 09/12/16 0913      PT LONG TERM GOAL #1   Title Patient to demonstrate functional strength as being 5/5 in all tested groups in order to improve mechanics and reduce pain    Baseline 9/7: proximal hip strength still 4 to 4+/5   Time 6   Period Weeks   Status Partially Met     PT LONG TERM GOAL #2   Title Patient to be able to stand and walk for at least 4 hours without pain in order to improve work tolerance and QOL    Baseline 9/7: can stand and walk at work for 4 hours, but once she's home her pain increases   Time 6   Period Weeks   Status Achieved     PT LONG TERM GOAL #3   Title Patient to report that she has been able to return to regular walking and exercise program without pain in order to improve QOL    Baseline 9/7: has not returned to these due to the pain   Time 6   Period Weeks   Status On-going     PT LONG TERM GOAL #4   Title Patient to be compliant with advanced HEP to prevent recurrence of condition and maintain fucntional gains    Time 6   Period Weeks   Status On-going               Plan - 09/12/16 0929    Clinical Impression Statement PT  reassessed pt's goals and outcome measures this date. Pt has made good progress towards her goals and she feels like she  has made improvements. She feels like her L foot isn't as stiff and her pain is not as bad as it was at nights. However, she reports that she still has pain and feels her work boots are affecting her. Pt stated that ionto helped her the two times it was applied at the beginning of her POC; this PT will discuss with the evaluating therapist about potentially resuming ionto for 4 more sessions. Pt would benefit from continued skilled PT services to address her remaining deficits in order to maximize function at home and at work.    Rehab Potential Good   Clinical Impairments Affecting Rehab Potential (+) success with PT in the past, motivated to participate; (-) recurrent condition, structural LLD, chronic hip and back pain    PT Frequency 2x / week   PT Duration 4 weeks   PT Treatment/Interventions ADLs/Self Care Home Management;Biofeedback;Cryotherapy;Electrical Stimulation;Iontophoresis 44m/ml Dexamethasone;Moist Heat;Gait training;Stair training;Functional mobility training;Therapeutic activities;Therapeutic exercise;Balance training;Neuromuscular re-education;Patient/family education;Orthotic Fit/Training;Manual techniques   PT Next Visit Plan Continue with current program, more SLS on foam, + toe extension in stance, consider adding band eversion to HEP.  Begin reciprocal stair training when appropriate; continue eccentric PF strengthening and isometric PF for pain control; potentially resume ionto pending discussion with evaluating therapist   PT Home Exercise Plan Eval: ankle circles, ankle alphabet, gastroc stretch, SKTC, corner stretch; plantar fascia stretch   Consulted and Agree with Plan of Care Patient      Patient will benefit from skilled therapeutic intervention in order to improve the following deficits and impairments:  Abnormal gait, Pain, Decreased coordination, Decreased strength, Decreased range of motion, Decreased balance, Difficulty walking, Impaired flexibility  Visit  Diagnosis: Pain in left ankle and joints of left foot - Plan: PT plan of care cert/re-cert  Pain in right ankle and joints of right foot - Plan: PT plan of care cert/re-cert  Difficulty in walking, not elsewhere classified - Plan: PT plan of care cert/re-cert  Other symptoms and signs involving the musculoskeletal system - Plan: PT plan of care cert/re-cert     Problem List Patient Active Problem List   Diagnosis Date Noted  . Yeast infection 03/13/2016  . Fibroids 03/13/2016  . Well woman exam with routine gynecological exam 11/28/2015  . Burning with urination 11/28/2015  . Subacute frontal sinusitis 11/28/2015  . Leg cramps 10/31/2015  . Chest pain 10/31/2015  . Varicose 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 046/96/2952 . Body aches 11/21/2014  . Constipation 11/21/2014  . Vaginal itching 03/24/2014  . Vaginal discharge 03/24/2014  . Pelvic pain 11/02/2013  . Elevated cholesterol 11/02/2013  . Low back pain 10/20/2013  . Stiffness of ankle joint 10/20/2013  . Pain in joint, ankle and foot 10/20/2013  . Vaginal irritation 05/23/2013  . Hematuria 05/23/2013  . BV (bacterial vaginosis) 05/23/2013  . HEMATOCHEZIA 12/05/2009  . Dysphagia 12/05/2009  . CHEST WALL PAIN, ANTERIOR 06/23/2007  . LEG PAIN 05/18/2007  . VAGINAL PRURITUS 04/16/2007  . GOITER 03/10/2007  . HYPOTHYROIDISM 03/10/2007  . DIABETES MELLITUS, TYPE II 03/10/2007  . HYPERLIPIDEMIA 03/10/2007  . ANEMIA-IRON DEFICIENCY 03/10/2007  . HYPERTENSION 03/10/2007  . RHINITIS, CHRONIC 03/10/2007  . ASTHMA, CHILDHOOD 03/10/2007  . GERD 03/10/2007  . PEPTIC ULCER DISEASE 03/10/2007  . MENOPAUSAL SYNDROME 03/10/2007       BGeraldine SolarPT, DPT  [facility]  [facility] [address] [place], [place], [address] Phone: [phone]   Fax:  [phone]  Name: Becky [name] MRN: [id number] Date of Birth: 02/06/1960

## 2016-09-13 ENCOUNTER — Other Ambulatory Visit: Payer: Self-pay | Admitting: Nurse Practitioner

## 2016-09-17 ENCOUNTER — Ambulatory Visit (HOSPITAL_COMMUNITY): Payer: BLUE CROSS/BLUE SHIELD

## 2016-09-17 ENCOUNTER — Telehealth (HOSPITAL_COMMUNITY): Payer: Self-pay | Admitting: Internal Medicine

## 2016-09-17 NOTE — Telephone Encounter (Signed)
09/17/16  pt wanted to cx but no reason given... she was rescheduled for 9/14

## 2016-09-19 ENCOUNTER — Encounter (HOSPITAL_COMMUNITY): Payer: Self-pay

## 2016-09-19 ENCOUNTER — Ambulatory Visit (HOSPITAL_COMMUNITY): Payer: BLUE CROSS/BLUE SHIELD

## 2016-09-19 DIAGNOSIS — M25571 Pain in right ankle and joints of right foot: Secondary | ICD-10-CM

## 2016-09-19 DIAGNOSIS — M25572 Pain in left ankle and joints of left foot: Secondary | ICD-10-CM | POA: Diagnosis not present

## 2016-09-19 DIAGNOSIS — R29898 Other symptoms and signs involving the musculoskeletal system: Secondary | ICD-10-CM

## 2016-09-19 DIAGNOSIS — R262 Difficulty in walking, not elsewhere classified: Secondary | ICD-10-CM

## 2016-09-19 NOTE — Therapy (Signed)
[name], [place], [address] Phone: [phone]   Fax:  [phone]  Physical Therapy Treatment  Patient Details  Name: Becky [name] MRN: [id number] Date of Birth: 05/03/1960 Referring Provider: Caprice Beaver   Encounter Date: 09/19/2016      PT End of Session - 09/19/16 0955    Visit Number 9   Number of Visits 21   Date for PT Re-Evaluation 10/10/16   Authorization Type BC[employer] Other    Authorization Time Period 09/10/16 to 10/10/16   PT Start Time 7903  pt late   PT Stop Time 1028   PT Time Calculation (min) 35 min   Activity Tolerance Patient tolerated treatment well;No increased pain   Behavior During Therapy WFL for tasks assessed/performed      Past Medical History:  Diagnosis Date  . Back pain   . Body aches 11/21/2014  . BV (bacterial vaginosis) 05/23/2013  . Constipation 11/21/2014  . Elevated cholesterol 11/02/2013  . Fibroids 03/13/2016  . Hematuria 05/23/2013  . Hyperlipidemia   . Hypertension   . Migraines   . Pelvic pain in female 11/02/2013  . Plantar fasciitis of right foot   . Thyroid disease   . Vaginal discharge 03/24/2014  . Vaginal irritation 05/23/2013  . Vaginal itching 03/24/2014    Past Surgical History:  Procedure Laterality Date  . ECTOPIC PREGNANCY SURGERY    . ESOPHAGOGASTRODUODENOSCOPY  12/19/2009   YBF:XOVANV stricture s/p dilation/mild gastritis  . ESOPHAGOGASTRODUODENOSCOPY N/A 12/10/2015   Procedure: ESOPHAGOGASTRODUODENOSCOPY (EGD);  Surgeon: Danie Binder, MD;  [facility];  Service: Endoscopy;  Laterality: N/A;  830  . ileocolonoscopy  12/19/2009   BTY:[id number] polyps/mild left-side diverticulosis/hemorrhoids  . KNEE SURGERY    . TUBAL LIGATION      There were no vitals filed for this visit.      Subjective Assessment - 09/19/16 0955    Subjective Pt states that she is feeling better. She does not have any pain right now.    Currently in Pain? No/denies   Pain Onset More than a month ago                [facility] Adult PT Treatment/Exercise - 09/19/16 0001      Ankle Exercises: Stretches   Gastroc Stretch 3 reps;30 seconds  SL on step   Slant Board Stretch 3 reps;30 seconds     Ankle Exercises: Standing   SLS bil on foam 10x10" each   Rebounder bil SLS 2x10 with green ball   Heel Raises 20 reps  BLE   Other Standing Ankle Exercises eccentric PF x12 BLE; bil single leg PF isometrics 10x10" holds each; PF on airex x20 reps; fwd lunging on BOSU 2x10 each, lat lunging on BOSU x10 each; star drill on foam x5RT each              PT Education - 09/19/16 1026    Education provided Yes   Education Details exercise technique   Person(s) Educated Patient   Methods Explanation;Demonstration   Comprehension Verbalized understanding;Returned demonstration          PT Short Term Goals - 09/12/16 0907      PT SHORT TERM GOAL #1   Title Patient to experience pain as being no more than 2/10 in weight bearing positions in order to improve QOL and work tolerance    Baseline 9/7: pain averages about 8-9/10   Time 3   Period Weeks  Status On-going     PT SHORT TERM GOAL #2   Title Patient to demonstrate an improvement of at least 10 degrees in B ankle dorsiflexion as well as inversion/eversion ROM as being equal between R and L feet in order to improve mechancis and pain    Baseline 9/7: L DF 0 deg, R DF -3   Time 3   Period Weeks   Status On-going     PT SHORT TERM GOAL #3   Title Patient to demonstrate improved gait mechanics including reduction of antalgic pattern and heel toe gait in order to improve mobility and walking tolerance    Baseline 9/7; antalgia remains and decreased heel to toe   Time 3   Period Weeks   Status On-going     PT SHORT TERM GOAL #4   Title Patient to be compliant in correct performance of HEP, to be updated PRN    Baseline 9/7: has been consistent with it until  recently   Time 1   Period Weeks   Status Partially Met           PT Long Term Goals - 09/12/16 0913      PT LONG TERM GOAL #1   Title Patient to demonstrate functional strength as being 5/5 in all tested groups in order to improve mechanics and reduce pain    Baseline 9/7: proximal hip strength still 4 to 4+/5   Time 6   Period Weeks   Status Partially Met     PT LONG TERM GOAL #2   Title Patient to be able to stand and walk for at least 4 hours without pain in order to improve work tolerance and QOL    Baseline 9/7: can stand and walk at work for 4 hours, but once she's home her pain increases   Time 6   Period Weeks   Status Achieved     PT LONG TERM GOAL #3   Title Patient to report that she has been able to return to regular walking and exercise program without pain in order to improve QOL    Baseline 9/7: has not returned to these due to the pain   Time 6   Period Weeks   Status On-going     PT LONG TERM GOAL #4   Title Patient to be compliant with advanced HEP to prevent recurrence of condition and maintain fucntional gains    Time 6   Period Weeks   Status On-going               Plan - 09/19/16 1026    Clinical Impression Statement Session limited as pt arrived late for appointment. Session focused on strengthening and dynamic stability of bil ankles. Pt tolerated well with no reports of pain throughout or at EOS. Continue POC as planned.   Rehab Potential Good   Clinical Impairments Affecting Rehab Potential (+) success with PT in the past, motivated to participate; (-) recurrent condition, structural LLD, chronic hip and back pain    PT Frequency 2x / week   PT Duration 4 weeks   PT Treatment/Interventions ADLs/Self Care Home Management;Biofeedback;Cryotherapy;Electrical Stimulation;Iontophoresis 51m/ml Dexamethasone;Moist Heat;Gait training;Stair training;Functional mobility training;Therapeutic activities;Therapeutic exercise;Balance  training;Neuromuscular re-education;Patient/family education;Orthotic Fit/Training;Manual techniques   PT Next Visit Plan Continue with current program, more SLS on foam, + toe extension in stance, consider adding band eversion to HEP.  Begin reciprocal stair training when appropriate; continue eccentric PF strengthening and isometric PF for pain control; potentially resume ionto  pending discussion with evaluating therapist; ; continue dynamic stability of ankles   PT Home Exercise Plan Eval: ankle circles, ankle alphabet, gastroc stretch, SKTC, corner stretch; plantar fascia stretch   Consulted and Agree with Plan of Care Patient      Patient will benefit from skilled therapeutic intervention in order to improve the following deficits and impairments:  Abnormal gait, Pain, Decreased coordination, Decreased strength, Decreased range of motion, Decreased balance, Difficulty walking, Impaired flexibility  Visit Diagnosis: Pain in left ankle and joints of left foot  Pain in right ankle and joints of right foot  Difficulty in walking, not elsewhere classified  Other symptoms and signs involving the musculoskeletal system     Problem List Patient Active Problem List   Diagnosis Date Noted  . Yeast infection 03/13/2016  . Fibroids 03/13/2016  . Well woman exam with routine gynecological exam 11/28/2015  . Burning with urination 11/28/2015  . Subacute frontal sinusitis 11/28/2015  . Leg cramps 10/31/2015  . Chest pain 10/31/2015  . Varicose 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 40/34/7425  . Body aches 11/21/2014  . Constipation 11/21/2014  . Vaginal itching 03/24/2014  . Vaginal discharge 03/24/2014  . Pelvic pain 11/02/2013  . Elevated cholesterol 11/02/2013  . Low back pain 10/20/2013  . Stiffness of ankle joint 10/20/2013  . Pain in joint, ankle and foot 10/20/2013  . Vaginal irritation 05/23/2013  . Hematuria 05/23/2013  . BV (bacterial vaginosis) 05/23/2013  .  HEMATOCHEZIA 12/05/2009  . Dysphagia 12/05/2009  . CHEST WALL PAIN, ANTERIOR 06/23/2007  . LEG PAIN 05/18/2007  . VAGINAL PRURITUS 04/16/2007  . GOITER 03/10/2007  . HYPOTHYROIDISM 03/10/2007  . DIABETES MELLITUS, TYPE II 03/10/2007  . HYPERLIPIDEMIA 03/10/2007  . ANEMIA-IRON DEFICIENCY 03/10/2007  . HYPERTENSION 03/10/2007  . RHINITIS, CHRONIC 03/10/2007  . ASTHMA, CHILDHOOD 03/10/2007  . GERD 03/10/2007  . PEPTIC ULCER DISEASE 03/10/2007  . MENOPAUSAL SYNDROME 03/10/2007      Geraldine Solar PT, DPT  [place] [address] [place], [place], [address] Phone: [phone]   Fax:  [phone]  Name: Trenyce Loera MRN: [id number] Date of Birth: 1960/09/05

## 2016-09-24 ENCOUNTER — Encounter (HOSPITAL_COMMUNITY): Payer: BLUE CROSS/BLUE SHIELD

## 2016-09-24 ENCOUNTER — Ambulatory Visit (HOSPITAL_COMMUNITY): Payer: BLUE CROSS/BLUE SHIELD

## 2016-09-24 ENCOUNTER — Telehealth (HOSPITAL_COMMUNITY): Payer: Self-pay

## 2016-09-24 DIAGNOSIS — M25572 Pain in left ankle and joints of left foot: Secondary | ICD-10-CM | POA: Diagnosis not present

## 2016-09-24 DIAGNOSIS — M25571 Pain in right ankle and joints of right foot: Secondary | ICD-10-CM

## 2016-09-24 DIAGNOSIS — R29898 Other symptoms and signs involving the musculoskeletal system: Secondary | ICD-10-CM

## 2016-09-24 DIAGNOSIS — R262 Difficulty in walking, not elsewhere classified: Secondary | ICD-10-CM

## 2016-09-24 NOTE — Therapy (Signed)
[place], [place], [address] Phone: [phone]   Fax:  [phone]  Physical Therapy Treatment  Patient Details  Name: Becky [name] MRN: [id number] Date of Birth: 07-13-1960 Referring Provider: Caprice Beaver   Encounter Date: 09/24/2016      PT End of Session - 09/24/16 0952    Visit Number 10   Number of Visits 21   Date for PT Re-Evaluation 10/10/16   Authorization Type BCBS Other    Authorization Time Period 09/10/16 to 10/10/16   PT Start Time 0948   PT Stop Time 1028   PT Time Calculation (min) 40 min   Activity Tolerance Patient tolerated treatment well;No increased pain   Behavior During Therapy WFL for tasks assessed/performed      Past Medical History:  Diagnosis Date  . Back pain   . Body aches 11/21/2014  . BV (bacterial vaginosis) 05/23/2013  . Constipation 11/21/2014  . Elevated cholesterol 11/02/2013  . Fibroids 03/13/2016  . Hematuria 05/23/2013  . Hyperlipidemia   . Hypertension   . Migraines   . Pelvic pain in female 11/02/2013  . Plantar fasciitis of right foot   . Thyroid disease   . Vaginal discharge 03/24/2014  . Vaginal irritation 05/23/2013  . Vaginal itching 03/24/2014    Past Surgical History:  Procedure Laterality Date  . ECTOPIC PREGNANCY SURGERY    . ESOPHAGOGASTRODUODENOSCOPY  12/19/2009   ONG:EXBMWU stricture s/p dilation/mild gastritis  . ESOPHAGOGASTRODUODENOSCOPY N/A 12/10/2015   Procedure: ESOPHAGOGASTRODUODENOSCOPY (EGD);  Surgeon: Danie Binder, MD;  [facility];  Service: Endoscopy;  Laterality: N/A;  830  . ileocolonoscopy  12/19/2009   XLK:[id number] polyps/mild left-side diverticulosis/hemorrhoids  . KNEE SURGERY    . TUBAL LIGATION      There were no vitals filed for this visit.      Subjective Assessment - 09/24/16 0950    Subjective Pt reports complaince with HEP.  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 Yes   Pain Score 4    Pain Location Foot   Pain Orientation Left   Pain Descriptors / Indicators Sore   Pain Type Chronic pain   Pain Onset More than a month ago   Pain Frequency Intermittent   Aggravating Factors  standing, walking   Pain Relieving Factors ice   Effect of Pain on Daily Activities moderate                         [facility] Adult PT Treatment/Exercise - 09/24/16 0001      Manual Therapy   Manual Therapy Soft tissue mobilization   Manual therapy comments manual complete separate rest of tx   Soft tissue mobilization gastroc/soleux complex and plantar fascia in prone     Ankle Exercises: Standing   Rebounder bil SLS 2x10 with green ball   Heel Raises 20 reps  BLE on slope   Other Standing Ankle Exercises 5RT reciprocal pattern stairs 7 in 1-2 HR A; SLS with toe extension 10x 5"   Other Standing Ankle Exercises eccentric PF x12 BLE; bil single leg PF isometrics 10x10" holds each; PF on airex x20 reps; fwd lunging on BOSU 2x10 each, lat lunging on BOSU x10  each; star drill on foam x5RT each     Ankle Exercises: Stretches   Plantar Fascia Stretch 3 reps;30 seconds   Gastroc Stretch --   Slant Board Stretch 3 reps;30 seconds                  PT Short Term Goals - 09/12/16 0907      PT SHORT TERM GOAL #1   Title Patient to experience pain as being no more than 2/10 in weight bearing positions in order to improve QOL and work tolerance    Baseline 9/7: pain averages about 8-9/10   Time 3   Period Weeks   Status On-going     PT SHORT TERM GOAL #2   Title Patient to demonstrate an improvement of at least 10 degrees in B ankle dorsiflexion as well as inversion/eversion ROM as being equal between R and L feet in order to improve mechancis  and pain    Baseline 9/7: L DF 0 deg, R DF -3   Time 3   Period Weeks   Status On-going     PT SHORT TERM GOAL #3   Title Patient to demonstrate improved gait mechanics including reduction of antalgic pattern and heel toe gait in order to improve mobility and walking tolerance    Baseline 9/7; antalgia remains and decreased heel to toe   Time 3   Period Weeks   Status On-going     PT SHORT TERM GOAL #4   Title Patient to be compliant in correct performance of HEP, to be updated PRN    Baseline 9/7: has been consistent with it until recently   Time 1   Period Weeks   Status Partially Met           PT Long Term Goals - 09/12/16 0913      PT LONG TERM GOAL #1   Title Patient to demonstrate functional strength as being 5/5 in all tested groups in order to improve mechanics and reduce pain    Baseline 9/7: proximal hip strength still 4 to 4+/5   Time 6   Period Weeks   Status Partially Met     PT LONG TERM GOAL #2   Title Patient to be able to stand and walk for at least 4 hours without pain in order to improve work tolerance and QOL    Baseline 9/7: can stand and walk at work for 4 hours, but once she's home her pain increases   Time 6   Period Weeks   Status Achieved     PT LONG TERM GOAL #3   Title Patient to report that she has been able to return to regular walking and exercise program without pain in order to improve QOL    Baseline 9/7: has not returned to these due to the pain   Time 6   Period Weeks   Status On-going     PT LONG TERM GOAL #4   Title Patient to be compliant with advanced HEP to prevent recurrence of condition and maintain fucntional gains    Time 6   Period Weeks   Status On-going               Plan - 09/24/16 1018    Clinical Impression Statement Session focus on ankle mobility with stretches, functional strengthening and dynamic stability for Bil ankles.  Pt continues to demonstrate weakness in ankle with inability to complete heel  raises full range without some HHA and  instabiility noted with balance activities.  Added reciprocal pattern stair training with cueing for pattern and SLS toe extension for balance.  EOS with manual technqiues to address tightness and myofascial restricions with reports of pain reduction following.     Rehab Potential Good   Clinical Impairments Affecting Rehab Potential (+) success with PT in the past, motivated to participate; (-) recurrent condition, structural LLD, chronic hip and back pain    PT Frequency 2x / week   PT Duration 4 weeks   PT Treatment/Interventions ADLs/Self Care Home Management;Biofeedback;Cryotherapy;Electrical Stimulation;Iontophoresis 72m/ml Dexamethasone;Moist Heat;Gait training;Stair training;Functional mobility training;Therapeutic activities;Therapeutic exercise;Balance training;Neuromuscular re-education;Patient/family education;Orthotic Fit/Training;Manual techniques   PT Next Visit Plan Continue with current program, more SLS on foam, + toe extension in stance, consider adding band eversion to HEP.  Begin reciprocal stair training when appropriate; continue eccentric PF strengthening and isometric PF for pain control; potentially resume ionto pending discussion with evaluating therapist; ; continue dynamic stability of ankles   PT Home Exercise Plan Eval: ankle circles, ankle alphabet, gastroc stretch, SKTC, corner stretch; plantar fascia stretch      Patient will benefit from skilled therapeutic intervention in order to improve the following deficits and impairments:  Abnormal gait, Pain, Decreased coordination, Decreased strength, Decreased range of motion, Decreased balance, Difficulty walking, Impaired flexibility  Visit Diagnosis: Pain in left ankle and joints of left foot  Pain in right ankle and joints of right foot  Difficulty in walking, not elsewhere classified  Other symptoms and signs involving the musculoskeletal system     Problem List Patient  Active Problem List   Diagnosis Date Noted  . Yeast infection 03/13/2016  . Fibroids 03/13/2016  . Well woman exam with routine gynecological exam 11/28/2015  . Burning with urination 11/28/2015  . Subacute frontal sinusitis 11/28/2015  . Leg cramps 10/31/2015  . Chest pain 10/31/2015  . Varicose 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 097/58/8325 . Body aches 11/21/2014  . Constipation 11/21/2014  . Vaginal itching 03/24/2014  . Vaginal discharge 03/24/2014  . Pelvic pain 11/02/2013  . Elevated cholesterol 11/02/2013  . Low back pain 10/20/2013  . Stiffness of ankle joint 10/20/2013  . Pain in joint, ankle and foot 10/20/2013  . Vaginal irritation 05/23/2013  . Hematuria 05/23/2013  . BV (bacterial vaginosis) 05/23/2013  . HEMATOCHEZIA 12/05/2009  . Dysphagia 12/05/2009  . CHEST WALL PAIN, ANTERIOR 06/23/2007  . LEG PAIN 05/18/2007  . VAGINAL PRURITUS 04/16/2007  . GOITER 03/10/2007  . HYPOTHYROIDISM 03/10/2007  . DIABETES MELLITUS, TYPE II 03/10/2007  . HYPERLIPIDEMIA 03/10/2007  . ANEMIA-IRON DEFICIENCY 03/10/2007  . HYPERTENSION 03/10/2007  . RHINITIS, CHRONIC 03/10/2007  . ASTHMA, CHILDHOOD 03/10/2007  . GERD 03/10/2007  . PEPTIC ULCER DISEASE 03/10/2007  . MENOPAUSAL SYNDROME 03/10/2007   CIhor Austin LPTA; [place] CAldona Lento9/19/2018, 10:42 AM  [place] [address]Phone: [phone]  Fax:  [phone] Name: LYuki BrunsmanMRN: [id number] of Birth: 601-30-1962

## 2016-09-24 NOTE — Telephone Encounter (Signed)
No show, called and spoke to pt who stated she was on way to rehab.  She thought apt was scheduled for 9:45.  Will see pt at 9:45 today.    [address], [place]; CBIS [phone]

## 2016-09-25 ENCOUNTER — Ambulatory Visit (HOSPITAL_COMMUNITY): Payer: BLUE CROSS/BLUE SHIELD

## 2016-09-25 DIAGNOSIS — M25572 Pain in left ankle and joints of left foot: Secondary | ICD-10-CM | POA: Diagnosis not present

## 2016-09-25 DIAGNOSIS — R262 Difficulty in walking, not elsewhere classified: Secondary | ICD-10-CM

## 2016-09-25 DIAGNOSIS — M25571 Pain in right ankle and joints of right foot: Secondary | ICD-10-CM

## 2016-09-25 DIAGNOSIS — R29898 Other symptoms and signs involving the musculoskeletal system: Secondary | ICD-10-CM

## 2016-09-25 NOTE — Therapy (Signed)
[place], [place], [address] Phone: [phone]   Fax:  [phone]  Physical Therapy Treatment  Patient Details  Name: Becky [name] MRN: [id number] Date of Birth: 12/03/1960 Referring Provider: Caprice Beaver   Encounter Date: 09/25/2016      PT End of Session - 09/25/16 0909    Visit Number 11   Number of Visits 21   Date for PT Re-Evaluation 10/10/16   Authorization Type BCBS Other    Authorization Time Period 09/10/16 to 10/10/16   PT Start Time 0905   PT Stop Time 0944   PT Time Calculation (min) 39 min      Past Medical History:  Diagnosis Date  . Back pain   . Body aches 11/21/2014  . BV (bacterial vaginosis) 05/23/2013  . Constipation 11/21/2014  . Elevated cholesterol 11/02/2013  . Fibroids 03/13/2016  . Hematuria 05/23/2013  . Hyperlipidemia   . Hypertension   . Migraines   . Pelvic pain in female 11/02/2013  . Plantar fasciitis of right foot   . Thyroid disease   . Vaginal discharge 03/24/2014  . Vaginal irritation 05/23/2013  . Vaginal itching 03/24/2014    Past Surgical History:  Procedure Laterality Date  . ECTOPIC PREGNANCY SURGERY    . ESOPHAGOGASTRODUODENOSCOPY  12/19/2009   YQM:GNOIBB stricture s/p dilation/mild gastritis  . ESOPHAGOGASTRODUODENOSCOPY N/A 12/10/2015   Procedure: ESOPHAGOGASTRODUODENOSCOPY (EGD);  Surgeon: Danie Binder, MD;  [facility];  Service: Endoscopy;  Laterality: N/A;  830  . ileocolonoscopy  12/19/2009   CWU:[id number] polyps/mild left-side diverticulosis/hemorrhoids  . KNEE SURGERY    . TUBAL LIGATION      There were no vitals filed for this visit.      Subjective Assessment - 09/25/16 0908    Subjective Pt stated she began steroids yesterday for sinuses and feels good all over today.  No reports of pain in foot, knee, back or shoulder today.   Pertinent History HTN, borderline DM, hypothyroidism, hx LBP, hx R knee surgery (plate  and screws)   Patient Stated Goals get rid of pain, walk comfortably    Currently in Pain? No/denies                         [facility] Adult PT Treatment/Exercise - 09/25/16 0001      Manual Therapy   Manual Therapy Soft tissue mobilization   Manual therapy comments manual complete separate rest of tx   Soft tissue mobilization gastroc/soleux complex and plantar fascia in prone     Ankle Exercises: Standing   SLS star gazer 5    Rebounder bil SLS 2x10 with green ball   Heel Raises 20 reps  Bil Up SLS down   Toe Raise 20 reps   Other Standing Ankle Exercises forward and lateral step ups 15x 6in step height; 5RT reciprocal pattern 7in height with 1 HR A   Other Standing Ankle Exercises eccentric PF x12 BLE; bil single leg PF isometrics 10x10" holds each; PF on airex x20 reps; fwd lunging on BOSU 2x10 each, lat lunging on BOSU x10 each; star drill on foam x5RT each     Ankle Exercises: Stretches   Plantar Fascia Stretch 3 reps;30 seconds   Slant Board Stretch 3 reps;30 seconds                  PT Short Term Goals - 09/12/16 0907      PT SHORT TERM  GOAL #1   Title Patient to experience pain as being no more than 2/10 in weight bearing positions in order to improve QOL and work tolerance    Baseline 9/7: pain averages about 8-9/10   Time 3   Period Weeks   Status On-going     PT SHORT TERM GOAL #2   Title Patient to demonstrate an improvement of at least 10 degrees in B ankle dorsiflexion as well as inversion/eversion ROM as being equal between R and L feet in order to improve mechancis and pain    Baseline 9/7: L DF 0 deg, R DF -3   Time 3   Period Weeks   Status On-going     PT SHORT TERM GOAL #3   Title Patient to demonstrate improved gait mechanics including reduction of antalgic pattern and heel toe gait in order to improve mobility and walking tolerance    Baseline 9/7; antalgia remains and decreased heel to toe   Time 3   Period Weeks   Status  On-going     PT SHORT TERM GOAL #4   Title Patient to be compliant in correct performance of HEP, to be updated PRN    Baseline 9/7: has been consistent with it until recently   Time 1   Period Weeks   Status Partially Met           PT Long Term Goals - 09/12/16 0913      PT LONG TERM GOAL #1   Title Patient to demonstrate functional strength as being 5/5 in all tested groups in order to improve mechanics and reduce pain    Baseline 9/7: proximal hip strength still 4 to 4+/5   Time 6   Period Weeks   Status Partially Met     PT LONG TERM GOAL #2   Title Patient to be able to stand and walk for at least 4 hours without pain in order to improve work tolerance and QOL    Baseline 9/7: can stand and walk at work for 4 hours, but once she's home her pain increases   Time 6   Period Weeks   Status Achieved     PT LONG TERM GOAL #3   Title Patient to report that she has been able to return to regular walking and exercise program without pain in order to improve QOL    Baseline 9/7: has not returned to these due to the pain   Time 6   Period Weeks   Status On-going     PT LONG TERM GOAL #4   Title Patient to be compliant with advanced HEP to prevent recurrence of condition and maintain fucntional gains    Time 6   Period Weeks   Status On-going               Plan - 09/25/16 0929    Clinical Impression Statement Session focus on functional strenghtening and balance training.  Pt presents this session with significant ease completeing reciprocal pattern stair training with less HHA and no reports of pain with task.  Does continues to demonstrate weakness with difficulty completeing full range with heel and toe raises.  Continued with manual to address myofascial and overall gastroc tightness at EOS.  No reports of pain through session.     Rehab Potential Good   Clinical Impairments Affecting Rehab Potential (+) success with PT in the past, motivated to participate; (-)  recurrent condition, structural LLD, chronic hip and back pain  PT Frequency 2x / week   PT Duration 4 weeks   PT Treatment/Interventions ADLs/Self Care Home Management;Biofeedback;Cryotherapy;Electrical Stimulation;Iontophoresis 78m/ml Dexamethasone;Moist Heat;Gait training;Stair training;Functional mobility training;Therapeutic activities;Therapeutic exercise;Balance training;Neuromuscular re-education;Patient/family education;Orthotic Fit/Training;Manual techniques   PT Next Visit Plan Update current HEP for ankle strengthening.  Begin heel/toe walking next session if able.  Continue wiht dynamic stability of ankle, SLS on foam and stretches.  Continue reciprocal stair training.  Potentially resume ionto pending discussion wiht evaluation therapist PRN    PT Home Exercise Plan Eval: ankle circles, ankle alphabet, gastroc stretch, SKTC, corner stretch; plantar fascia stretch      Patient will benefit from skilled therapeutic intervention in order to improve the following deficits and impairments:  Abnormal gait, Pain, Decreased coordination, Decreased strength, Decreased range of motion, Decreased balance, Difficulty walking, Impaired flexibility  Visit Diagnosis: Pain in left ankle and joints of left foot  Pain in right ankle and joints of right foot  Difficulty in walking, not elsewhere classified  Other symptoms and signs involving the musculoskeletal system     Problem List Patient Active Problem List   Diagnosis Date Noted  . Yeast infection 03/13/2016  . Fibroids 03/13/2016  . Well woman exam with routine gynecological exam 11/28/2015  . Burning with urination 11/28/2015  . Subacute frontal sinusitis 11/28/2015  . Leg cramps 10/31/2015  . Chest pain 10/31/2015  . Varicose 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 049/70/2637 . Body aches 11/21/2014  . Constipation 11/21/2014  . Vaginal itching 03/24/2014  . Vaginal discharge 03/24/2014  . Pelvic pain 11/02/2013   . Elevated cholesterol 11/02/2013  . Low back pain 10/20/2013  . Stiffness of ankle joint 10/20/2013  . Pain in joint, ankle and foot 10/20/2013  . Vaginal irritation 05/23/2013  . Hematuria 05/23/2013  . BV (bacterial vaginosis) 05/23/2013  . HEMATOCHEZIA 12/05/2009  . Dysphagia 12/05/2009  . CHEST WALL PAIN, ANTERIOR 06/23/2007  . LEG PAIN 05/18/2007  . VAGINAL PRURITUS 04/16/2007  . GOITER 03/10/2007  . HYPOTHYROIDISM 03/10/2007  . DIABETES MELLITUS, TYPE II 03/10/2007  . HYPERLIPIDEMIA 03/10/2007  . ANEMIA-IRON DEFICIENCY 03/10/2007  . HYPERTENSION 03/10/2007  . RHINITIS, CHRONIC 03/10/2007  . ASTHMA, CHILDHOOD 03/10/2007  . GERD 03/10/2007  . PEPTIC ULCER DISEASE 03/10/2007  . MENOPAUSAL SYNDROME 03/10/2007   CIhor Austin [place] CAldona Lento9/20/2018, 9:51 AM  [id number] [address]Phone: [phone]  Fax:  [phone] Name: Becky CorronMRN: [id number] of Birth: [birth year]

## 2016-09-30 ENCOUNTER — Ambulatory Visit (HOSPITAL_COMMUNITY): Payer: BLUE CROSS/BLUE SHIELD

## 2016-09-30 DIAGNOSIS — R262 Difficulty in walking, not elsewhere classified: Secondary | ICD-10-CM

## 2016-09-30 DIAGNOSIS — M25572 Pain in left ankle and joints of left foot: Secondary | ICD-10-CM | POA: Diagnosis not present

## 2016-09-30 DIAGNOSIS — R29898 Other symptoms and signs involving the musculoskeletal system: Secondary | ICD-10-CM

## 2016-09-30 DIAGNOSIS — M25571 Pain in right ankle and joints of right foot: Secondary | ICD-10-CM

## 2016-09-30 NOTE — Patient Instructions (Signed)
Heel Raises    Stand with support. Tighten pelvic floor and hold. With knees straight, raise heels off ground. Hold 3-5 seconds.  Repeat 20 times. Do 2_ times a day.  Copyright  VHI. All rights reserved.   Plantar Fascia Stretch    Standing with only ball of left foot on stair, push heel down until stretch is felt through arch of foot. Hold 30 seconds. Relax. Repeat 3 times per set.  http://orth.exer.us/23   Copyright  VHI. All rights reserved.   Single Leg Balance: Eyes Open    Stand on right leg with eyes open. Hold 60 seconds. 3 reps 2 times per day.  http://ggbe.exer.us/5   Copyright  VHI. All rights reserved.

## 2016-09-30 NOTE — Therapy (Signed)
[place], [place], [address] Phone: [phone]   Fax:  [phone]  Physical Therapy Treatment  Patient Details  Name: Becky [name] MRN: [id number] Date of Birth: Oct 14, 1960 Referring Provider: Caprice Beaver   Encounter Date: 09/30/2016      PT End of Session - 09/30/16 0915    Visit Number 12   Number of Visits 21   Date for PT Re-Evaluation 10/10/16   Authorization Type BCBS Other    Authorization Time Period 09/10/16 to 10/10/16   PT Start Time 0910  late for apt   PT Stop Time 0949   PT Time Calculation (min) 39 min   Activity Tolerance Patient tolerated treatment well;No increased pain   Behavior During Therapy WFL for tasks assessed/performed      Past Medical History:  Diagnosis Date  . Back pain   . Body aches 11/21/2014  . BV (bacterial vaginosis) 05/23/2013  . Constipation 11/21/2014  . Elevated cholesterol 11/02/2013  . Fibroids 03/13/2016  . Hematuria 05/23/2013  . Hyperlipidemia   . Hypertension   . Migraines   . Pelvic pain in female 11/02/2013  . Plantar fasciitis of right foot   . Thyroid disease   . Vaginal discharge 03/24/2014  . Vaginal irritation 05/23/2013  . Vaginal itching 03/24/2014    Past Surgical History:  Procedure Laterality Date  . ECTOPIC PREGNANCY SURGERY    . ESOPHAGOGASTRODUODENOSCOPY  12/19/2009   UOH:FGBMSX stricture s/p dilation/mild gastritis  . ESOPHAGOGASTRODUODENOSCOPY N/A 12/10/2015   Procedure: ESOPHAGOGASTRODUODENOSCOPY (EGD);  Surgeon: Danie Binder, MD;  [facility];  Service: Endoscopy;  Laterality: N/A;  830  . ileocolonoscopy  12/19/2009   JDB:[id number] polyps/mild left-side diverticulosis/hemorrhoids  . KNEE SURGERY    . TUBAL LIGATION      There were no vitals filed for this visit.      Subjective Assessment - 09/30/16 0911    Subjective Pt reports she is pain free today and improved with sinus issues following  steroids last week.     Pertinent History HTN, borderline DM, hypothyroidism, hx LBP, hx R knee surgery (plate and screws)   Patient Stated Goals get rid of pain, walk comfortably    Currently in Pain? No/denies              [facility] Adult PT Treatment/Exercise - 09/30/16 0001      Manual Therapy   Manual Therapy Soft tissue mobilization   Manual therapy comments manual complete separate rest of tx   Soft tissue mobilization gastroc/soleux complex and plantar fascia in prone     Ankle Exercises: Standing   Heel Raises 20 reps   Toe Raise 20 reps   Heel Walk (Round Trip) 1RT   Toe Walk (Round Trip) 1RT   Other Standing Ankle Exercises 5RT reciprocal pattern 1 HR; reports Lt knee cap popping painful descending   Other Standing Ankle Exercises eccentric PF x12 BLE; bil single leg PF isometrics 10x10" holds each; PF on airex x20 reps; fwd lunging on BOSU 2x10 each, lat lunging on BOSU x10 each; star drill on foam x5RT each     Ankle Exercises: Stretches   Plantar Fascia Stretch 3 reps;30 seconds   Slant Board Stretch 3 reps;30 seconds     Ankle Exercises: Machines for Strengthening   Cybex Leg Press Plantarflexion stregthening 2x10 with 5Pl                  PT Short Term Goals -  09/12/16 0907      PT SHORT TERM GOAL #1   Title Patient to experience pain as being no more than 2/10 in weight bearing positions in order to improve QOL and work tolerance    Baseline 9/7: pain averages about 8-9/10   Time 3   Period Weeks   Status On-going     PT SHORT TERM GOAL #2   Title Patient to demonstrate an improvement of at least 10 degrees in B ankle dorsiflexion as well as inversion/eversion ROM as being equal between R and L feet in order to improve mechancis and pain    Baseline 9/7: L DF 0 deg, R DF -3   Time 3   Period Weeks   Status On-going     PT SHORT TERM GOAL #3   Title Patient to demonstrate improved gait mechanics including reduction of antalgic pattern and  heel toe gait in order to improve mobility and walking tolerance    Baseline 9/7; antalgia remains and decreased heel to toe   Time 3   Period Weeks   Status On-going     PT SHORT TERM GOAL #4   Title Patient to be compliant in correct performance of HEP, to be updated PRN    Baseline 9/7: has been consistent with it until recently   Time 1   Period Weeks   Status Partially Met           PT Long Term Goals - 09/12/16 0913      PT LONG TERM GOAL #1   Title Patient to demonstrate functional strength as being 5/5 in all tested groups in order to improve mechanics and reduce pain    Baseline 9/7: proximal hip strength still 4 to 4+/5   Time 6   Period Weeks   Status Partially Met     PT LONG TERM GOAL #2   Title Patient to be able to stand and walk for at least 4 hours without pain in order to improve work tolerance and QOL    Baseline 9/7: can stand and walk at work for 4 hours, but once she's home her pain increases   Time 6   Period Weeks   Status Achieved     PT LONG TERM GOAL #3   Title Patient to report that she has been able to return to regular walking and exercise program without pain in order to improve QOL    Baseline 9/7: has not returned to these due to the pain   Time 6   Period Weeks   Status On-going     PT LONG TERM GOAL #4   Title Patient to be compliant with advanced HEP to prevent recurrence of condition and maintain fucntional gains    Time 6   Period Weeks   Status On-going               Plan - 09/30/16 0936    Clinical Impression Statement Session focus on ankle mobility, functional strengthening, and balance training.  Pt continues to demonstrate decreased mobiltiy with toe raises and weakness with difficulty/inability to complete full range with heel and toe raises.  Stretches complete to improve mobility and added plantarflexion strengthening with cybex machines.  No reports of pain through session, did c/o knee cap grinding while  descending stairs.     Rehab Potential Good   Clinical Impairments Affecting Rehab Potential (+) success with PT in the past, motivated to participate; (-) recurrent condition, structural LLD, chronic hip  and back pain    PT Frequency 2x / week   PT Duration 4 weeks   PT Treatment/Interventions ADLs/Self Care Home Management;Biofeedback;Cryotherapy;Electrical Stimulation;Iontophoresis 41m/ml Dexamethasone;Moist Heat;Gait training;Stair training;Functional mobility training;Therapeutic activities;Therapeutic exercise;Balance training;Neuromuscular re-education;Patient/family education;Orthotic Fit/Training;Manual techniques   PT Next Visit Plan Continue ankle strengthening, heel/toe walking next session if able.  Continue wiht dynamic stability of ankle, SLS on foam and stretches.  Continue reciprocal stair training.  Potentially resume ionto pending discussion wiht evaluation therapist PRN    PT Home Exercise Plan Eval: ankle circles, ankle alphabet, gastroc stretch, SKTC, corner stretch; plantar fascia stretch; heel and toe raises standing and SLS.      Patient will benefit from skilled therapeutic intervention in order to improve the following deficits and impairments:  Abnormal gait, Pain, Decreased coordination, Decreased strength, Decreased range of motion, Decreased balance, Difficulty walking, Impaired flexibility  Visit Diagnosis: Pain in left ankle and joints of left foot  Pain in right ankle and joints of right foot  Difficulty in walking, not elsewhere classified  Other symptoms and signs involving the musculoskeletal system     Problem List Patient Active Problem List   Diagnosis Date Noted  . Yeast infection 03/13/2016  . Fibroids 03/13/2016  . Well woman exam with routine gynecological exam 11/28/2015  . Burning with urination 11/28/2015  . Subacute frontal sinusitis 11/28/2015  . Leg cramps 10/31/2015  . Chest pain 10/31/2015  . Varicose veins of right lower  extremity with complications 086/16/8372 . Body aches 11/21/2014  . Constipation 11/21/2014  . Vaginal itching 03/24/2014  . Vaginal discharge 03/24/2014  . Pelvic pain 11/02/2013  . Elevated cholesterol 11/02/2013  . Low back pain 10/20/2013  . Stiffness of ankle joint 10/20/2013  . Pain in joint, ankle and foot 10/20/2013  . Vaginal irritation 05/23/2013  . Hematuria 05/23/2013  . BV (bacterial vaginosis) 05/23/2013  . HEMATOCHEZIA 12/05/2009  . Dysphagia 12/05/2009  . CHEST WALL PAIN, ANTERIOR 06/23/2007  . LEG PAIN 05/18/2007  . VAGINAL PRURITUS 04/16/2007  . GOITER 03/10/2007  . HYPOTHYROIDISM 03/10/2007  . DIABETES MELLITUS, TYPE II 03/10/2007  . HYPERLIPIDEMIA 03/10/2007  . ANEMIA-IRON DEFICIENCY 03/10/2007  . HYPERTENSION 03/10/2007  . RHINITIS, CHRONIC 03/10/2007  . ASTHMA, CHILDHOOD 03/10/2007  . GERD 03/10/2007  . PEPTIC ULCER DISEASE 03/10/2007  . MENOPAUSAL SYNDROME 03/10/2007   CIhor Austin [place] CAldona Lento9/25/2018, 12:33 PM  CP[place]7216 [address] StreetSEmery N[place] [address]Phone: [phone]  Fax:  [phone] Name: Becky MoffittMRN: [id number] of Birth: [birth year]

## 2016-10-02 ENCOUNTER — Encounter (HOSPITAL_COMMUNITY): Payer: Self-pay

## 2016-10-02 ENCOUNTER — Ambulatory Visit (HOSPITAL_COMMUNITY): Payer: BLUE CROSS/BLUE SHIELD

## 2016-10-02 DIAGNOSIS — M25571 Pain in right ankle and joints of right foot: Secondary | ICD-10-CM

## 2016-10-02 DIAGNOSIS — M25572 Pain in left ankle and joints of left foot: Secondary | ICD-10-CM

## 2016-10-02 DIAGNOSIS — R262 Difficulty in walking, not elsewhere classified: Secondary | ICD-10-CM

## 2016-10-02 DIAGNOSIS — R29898 Other symptoms and signs involving the musculoskeletal system: Secondary | ICD-10-CM

## 2016-10-02 NOTE — Therapy (Signed)
[place], [place], [address] Phone: [phone]   Fax:  [phone]  Physical Therapy Treatment  Patient Details  Name: Becky [name] MRN: [id number] Date of Birth: [birth year] Referring Provider: Caprice Beaver   Encounter Date: 10/02/2016      PT End of Session - 10/02/16 1136    Visit Number 13   Number of Visits 21   Date for PT Re-Evaluation 10/10/16   Authorization Type BCBS Other    Authorization Time Period 09/10/16 to 10/10/16   PT Start Time 1129  therapist running behind for apt today   PT Stop Time 1215   PT Time Calculation (min) 46 min   Activity Tolerance Patient tolerated treatment well;No increased pain   Behavior During Therapy WFL for tasks assessed/performed      Past Medical History:  Diagnosis Date  . Back pain   . Body aches 11/21/2014  . BV (bacterial vaginosis) 05/23/2013  . Constipation 11/21/2014  . Elevated cholesterol 11/02/2013  . Fibroids 03/13/2016  . Hematuria 05/23/2013  . Hyperlipidemia   . Hypertension   . Migraines   . Pelvic pain in female 11/02/2013  . Plantar fasciitis of right foot   . Thyroid disease   . Vaginal discharge 03/24/2014  . Vaginal irritation 05/23/2013  . Vaginal itching 03/24/2014    Past Surgical History:  Procedure Laterality Date  . ECTOPIC PREGNANCY SURGERY    . ESOPHAGOGASTRODUODENOSCOPY  12/19/2009   ESP:QZRAQT stricture s/p dilation/mild gastritis  . ESOPHAGOGASTRODUODENOSCOPY N/A 12/10/2015   Procedure: ESOPHAGOGASTRODUODENOSCOPY (EGD);  Surgeon: Danie Binder, MD;  [facility];  Service: Endoscopy;  Laterality: N/A;  830  . ileocolonoscopy  12/19/2009   MAU:[id number] polyps/mild left-side diverticulosis/hemorrhoids  . KNEE SURGERY    . TUBAL LIGATION      There were no vitals filed for this visit.      Subjective Assessment - 10/02/16 1135    Subjective Pt stated her feet are feeling good today, no reports of  pain.  Does c/o Lt knee pain going down stairs sometimes   Pertinent History HTN, borderline DM, hypothyroidism, hx LBP, hx R knee surgery (plate and screws)   Patient Stated Goals get rid of pain, walk comfortably    Currently in Pain? No/denies                         [facility] Adult PT Treatment/Exercise - 10/02/16 0001      Manual Therapy   Manual Therapy Soft tissue mobilization   Manual therapy comments manual complete separate rest of tx   Soft tissue mobilization gastroc/soleux complex and plantar fascia in prone     Ankle Exercises: Standing   Vector Stance Right;Left;5 reps;5 seconds;Limitations  dynadisc with intermittent HHA PRN   Heel Raises 20 reps   Toe Raise 20 reps  cueing for form   Heel Walk (Round Trip) 2RT   Toe Walk (Round Trip) 2RT   Other Standing Ankle Exercises 5RT reciprocal pattern 1 HR (no reports of pain descending); gait training to improve toe extension for push off   Other Standing Ankle Exercises BLE SLS eccentric PF 15x; star gazer 5x 5" on dynadisc     Ankle Exercises: Stretches   Plantar Fascia Stretch 3 reps;30 seconds   Slant Board Stretch 3 reps;30 seconds     Ankle Exercises: Machines for Strengthening   Cybex Leg Press Plantarflexion stregthening 2x15 5" holds with 6PL  PT Short Term Goals - 09/12/16 0907      PT SHORT TERM GOAL #1   Title Patient to experience pain as being no more than 2/10 in weight bearing positions in order to improve QOL and work tolerance    Baseline 9/7: pain averages about 8-9/10   Time 3   Period Weeks   Status On-going     PT SHORT TERM GOAL #2   Title Patient to demonstrate an improvement of at least 10 degrees in B ankle dorsiflexion as well as inversion/eversion ROM as being equal between R and L feet in order to improve mechancis and pain    Baseline 9/7: L DF 0 deg, R DF -3   Time 3   Period Weeks   Status On-going     PT SHORT TERM GOAL #3   Title  Patient to demonstrate improved gait mechanics including reduction of antalgic pattern and heel toe gait in order to improve mobility and walking tolerance    Baseline 9/7; antalgia remains and decreased heel to toe   Time 3   Period Weeks   Status On-going     PT SHORT TERM GOAL #4   Title Patient to be compliant in correct performance of HEP, to be updated PRN    Baseline 9/7: has been consistent with it until recently   Time 1   Period Weeks   Status Partially Met           PT Long Term Goals - 09/12/16 0913      PT LONG TERM GOAL #1   Title Patient to demonstrate functional strength as being 5/5 in all tested groups in order to improve mechanics and reduce pain    Baseline 9/7: proximal hip strength still 4 to 4+/5   Time 6   Period Weeks   Status Partially Met     PT LONG TERM GOAL #2   Title Patient to be able to stand and walk for at least 4 hours without pain in order to improve work tolerance and QOL    Baseline 9/7: can stand and walk at work for 4 hours, but once she's home her pain increases   Time 6   Period Weeks   Status Achieved     PT LONG TERM GOAL #3   Title Patient to report that she has been able to return to regular walking and exercise program without pain in order to improve QOL    Baseline 9/7: has not returned to these due to the pain   Time 6   Period Weeks   Status On-going     PT LONG TERM GOAL #4   Title Patient to be compliant with advanced HEP to prevent recurrence of condition and maintain fucntional gains    Time 6   Period Weeks   Status On-going               Plan - 10/02/16 1247    Clinical Impression Statement Continue session focus with ankle mobility and strengthening.  Pt progressing well with ability to complete heel raises with improved range though does continue to show some weakness with eccentric control.  EOS wiht manual to address tightness in gastroc/soleus complex.  No reports of pain through session.      Rehab Potential Good   Clinical Impairments Affecting Rehab Potential (+) success with PT in the past, motivated to participate; (-) recurrent condition, structural LLD, chronic hip and back pain    PT Frequency  2x / week   PT Duration 4 weeks   PT Treatment/Interventions ADLs/Self Care Home Management;Biofeedback;Cryotherapy;Electrical Stimulation;Iontophoresis 80m/ml Dexamethasone;Moist Heat;Gait training;Stair training;Functional mobility training;Therapeutic activities;Therapeutic exercise;Balance training;Neuromuscular re-education;Patient/family education;Orthotic Fit/Training;Manual techniques   PT Next Visit Plan Take ROM measurement for dorsiflexion next session.  Continue ankle strengthening, heel/toe walking next session if able.  Continue wiht dynamic stability of ankle, SLS on foam and stretches.  Continue reciprocal stair training.  Potentially resume ionto pending discussion wiht evaluation therapist PRN    PT Home Exercise Plan Eval: ankle circles, ankle alphabet, gastroc stretch, SKTC, corner stretch; plantar fascia stretch; heel and toe raises standing and SLS.      Patient will benefit from skilled therapeutic intervention in order to improve the following deficits and impairments:  Abnormal gait, Pain, Decreased coordination, Decreased strength, Decreased range of motion, Decreased balance, Difficulty walking, Impaired flexibility  Visit Diagnosis: Pain in left ankle and joints of left foot  Pain in right ankle and joints of right foot  Difficulty in walking, not elsewhere classified  Other symptoms and signs involving the musculoskeletal system     Problem List Patient Active Problem List   Diagnosis Date Noted  . Yeast infection 03/13/2016  . Fibroids 03/13/2016  . Well woman exam with routine gynecological exam 11/28/2015  . Burning with urination 11/28/2015  . Subacute frontal sinusitis 11/28/2015  . Leg cramps 10/31/2015  . Chest pain 10/31/2015  . Varicose  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 070/34/0352 . Body aches 11/21/2014  . Constipation 11/21/2014  . Vaginal itching 03/24/2014  . Vaginal discharge 03/24/2014  . Pelvic pain 11/02/2013  . Elevated cholesterol 11/02/2013  . Low back pain 10/20/2013  . Stiffness of ankle joint 10/20/2013  . Pain in joint, ankle and foot 10/20/2013  . Vaginal irritation 05/23/2013  . Hematuria 05/23/2013  . BV (bacterial vaginosis) 05/23/2013  . HEMATOCHEZIA 12/05/2009  . Dysphagia 12/05/2009  . CHEST WALL PAIN, ANTERIOR 06/23/2007  . LEG PAIN 05/18/2007  . VAGINAL PRURITUS 04/16/2007  . GOITER 03/10/2007  . HYPOTHYROIDISM 03/10/2007  . DIABETES MELLITUS, TYPE II 03/10/2007  . HYPERLIPIDEMIA 03/10/2007  . ANEMIA-IRON DEFICIENCY 03/10/2007  . HYPERTENSION 03/10/2007  . RHINITIS, CHRONIC 03/10/2007  . ASTHMA, CHILDHOOD 03/10/2007  . GERD 03/10/2007  . PEPTIC ULCER DISEASE 03/10/2007  . MENOPAUSAL SYNDROME 03/10/2007   CIhor Austin LSaunders CF[place] CAldona Lento9/27/2018, 12:52 PM  [place] [address]Phone: [phone]  Fax:  [phone] Name: LBertina GuthridgeMRN: [id number] of Birth: 612-01-1960

## 2016-10-03 ENCOUNTER — Ambulatory Visit (INDEPENDENT_AMBULATORY_CARE_PROVIDER_SITE_OTHER): Payer: BLUE CROSS/BLUE SHIELD | Admitting: Gastroenterology

## 2016-10-03 ENCOUNTER — Encounter: Payer: Self-pay | Admitting: Gastroenterology

## 2016-10-03 VITALS — BP 157/94 | HR 82 | Temp 98.2°F | Ht 67.0 in | Wt 239.4 lb

## 2016-10-03 DIAGNOSIS — R1031 Right lower quadrant pain: Secondary | ICD-10-CM

## 2016-10-03 DIAGNOSIS — K5900 Constipation, unspecified: Secondary | ICD-10-CM | POA: Diagnosis not present

## 2016-10-03 DIAGNOSIS — K219 Gastro-esophageal reflux disease without esophagitis: Secondary | ICD-10-CM | POA: Diagnosis not present

## 2016-10-03 MED ORDER — LINACLOTIDE 72 MCG PO CAPS: 72.0000 ug | ORAL_CAPSULE | Freq: Every day | ORAL | 11 refills | Status: DC

## 2016-10-03 NOTE — Assessment & Plan Note (Signed)
Switched to Linzess 72 g daily. Prefer to take on a daily basis to see if this will help with abdominal pain. She will also collect ifobt. If positive will colonoscopy. If ongoing abdominal pain may consider CT scan.

## 2016-10-03 NOTE — Assessment & Plan Note (Signed)
Well-controlled on current regimen. Reinforced anti-reflex measures.

## 2016-10-03 NOTE — Patient Instructions (Signed)
1. Please stop Linzess 145 and start 12mcg daily. New RX sent to pharmacy.  2. Collect stool and bring back to our office.  3. Call in two weeks and let me know if your abdominal pain has improve, if not, we may consider CT scan.

## 2016-10-03 NOTE — Progress Notes (Signed)
Primary Care Physician: Redmond School, MD  Primary Gastroenterologist:  Barney Drain, MD   Chief Complaint  Patient presents with  . Abdominal Pain  . Constipation    HPI: Becky [name] is a 56 y.o. female here for follow-up. Last seen in December 2017, EGD she had gastritis but no H. pylori. Last colonoscopy 2011 with hyperplastic polyps. History of GERD and constipation.  Patient states she takes Linzess 145 g every other day.  It causes her to have watery stools. She states that she continues to have right lower quadrant abdominal pain, sometimes just comes on while she is walking. Seems to be associated with constipation. Does get some relief with bowel movements. No obvious blood in the stool, no melena. Dysphagia is better. Heartburn controlled on Dexilant. No unintentional weight loss. Now on Cymbalta to help with pain management.  Current Outpatient Prescriptions  Medication Sig Dispense Refill  . acetaminophen (TYLENOL) 500 MG tablet Take 500 mg by mouth as needed for moderate pain or headache.     Marland Kitchen amLODipine (NORVASC) 10 MG tablet Take 10 mg by mouth daily.    [name] THYROID 90 MG tablet Take 90 mg by mouth daily.   4  . atorvastatin (LIPITOR) 40 MG tablet Take 1 tablet (40 mg total) by mouth daily. (Patient taking differently: Take 40 mg by mouth every evening. ) 90 tablet 3  . Cholecalciferol (VITAMIN D3) 10000 units TABS Take 10,000 tablets by mouth daily.     . cyclobenzaprine (FLEXERIL) 5 MG tablet Take 1 tablet (5 mg total) by mouth 3 (three) times daily as needed for muscle spasms. 30 tablet 0  . DEXILANT 60 MG capsule TAKE ONE (1) CAPSULE EACH DAY 30 capsule 5  . DULoxetine (CYMBALTA) 30 MG capsule Take 30 mg by mouth 2 (two) times daily.    . fluticasone (FLONASE) 50 MCG/ACT nasal spray Place 2 sprays into the nose as needed for allergies or rhinitis.     . Glycerin-Polysorbate 80 (REFRESH DRY EYE THERAPY OP) Apply 1 drop to eye daily as needed (dry  eyes).    Marland Kitchen ibuprofen (ADVIL,MOTRIN) 200 MG tablet Take 800 mg by mouth as needed for headache or moderate pain.     Marland Kitchen losartan (COZAAR) 100 MG tablet Take 100 mg by mouth daily.    . Magnesium 400 MG CAPS Take 800 mg by mouth as needed (cramps).     Marland Kitchen spironolactone (ALDACTONE) 25 MG tablet Take 1 tablet (25 mg total) by mouth daily. 90 tablet 3  . linaclotide (LINZESS) 72 MCG capsule Take 1 capsule (72 mcg total) by mouth daily before breakfast. 30 capsule 11   No current facility-administered medications for this visit.     Allergies as of 10/03/2016 - Review Complete 10/03/2016  Allergen Reaction Noted  . Hydralazine Nausea Only 01/30/2016  . Prednisone Swelling 01/30/2015    ROS:  General: Negative for anorexia, weight loss, fever, chills, fatigue, weakness. ENT: Negative for hoarseness, difficulty swallowing , nasal congestion. CV: Negative for chest pain, angina, palpitations, dyspnea on exertion, peripheral edema.  Respiratory: Negative for dyspnea at rest, dyspnea on exertion, cough, sputum, wheezing.  GI: See history of present illness. GU:  Negative for dysuria, hematuria, urinary incontinence, urinary frequency, nocturnal urination.  Endo: Negative for unusual weight change.    Physical Examination:   BP (!) 157/94   Pulse 82   Temp 98.2 F (36.8 C) (Oral)   Ht 5\' 7"  (1.702 m)   Wt  239 lb 6.4 oz (108.6 kg)   BMI 37.50 kg/m   General: Well-nourished, well-developed in no acute distress.  Eyes: No icterus. Mouth: Oropharyngeal mucosa moist and pink , no lesions erythema or exudate. Lungs: Clear to auscultation bilaterally.  Heart: Regular rate and rhythm, no murmurs rubs or gallops.  Abdomen: Bowel sounds are normal,   nondistended, no hepatosplenomegaly or masses, no abdominal bruits or hernia , no rebound or guarding.  Mild right lower quadrant tenderness  Extremities: No lower extremity edema. No clubbing or deformities. Neuro: Alert and oriented x 4   Skin:  Warm and dry, no jaundice.   Psych: Alert and cooperative, normal mood and affect.  Labs:  Lab Results  Component Value Date   CREATININE 0.77 08/01/2015   BUN 10 08/01/2015   NA 141 08/01/2015   K 4.1 08/01/2015   CL 106 08/01/2015   CO2 26 08/01/2015   Lab Results  Component Value Date   ALT 30 05/22/2015   AST 20 05/22/2015   ALKPHOS 85 05/22/2015   BILITOT 0.4 05/22/2015   Lab Results  Component Value Date   WBC 7.5 05/22/2015   HGB 13.4 05/22/2015   HCT 40.6 05/22/2015   MCV 91.9 05/22/2015   PLT 316 05/22/2015    Imaging Studies: No results found.

## 2016-10-03 NOTE — Progress Notes (Signed)
cc'ed to pcp °

## 2016-10-07 ENCOUNTER — Encounter (HOSPITAL_COMMUNITY): Payer: Self-pay

## 2016-10-07 ENCOUNTER — Ambulatory Visit (HOSPITAL_COMMUNITY): Payer: BLUE CROSS/BLUE SHIELD | Attending: Podiatry

## 2016-10-07 DIAGNOSIS — M25571 Pain in right ankle and joints of right foot: Secondary | ICD-10-CM | POA: Insufficient documentation

## 2016-10-07 DIAGNOSIS — M25572 Pain in left ankle and joints of left foot: Secondary | ICD-10-CM | POA: Diagnosis not present

## 2016-10-07 DIAGNOSIS — R29898 Other symptoms and signs involving the musculoskeletal system: Secondary | ICD-10-CM | POA: Diagnosis present

## 2016-10-07 DIAGNOSIS — R262 Difficulty in walking, not elsewhere classified: Secondary | ICD-10-CM | POA: Insufficient documentation

## 2016-10-07 NOTE — Therapy (Signed)
[place], [place], [address] Phone: [phone]   Fax:  [phone]  Physical Therapy Treatment  Patient Details  Name: Becky [name] MRN: [id number] Date of Birth: Mar 14, 1960 Referring Provider: Caprice Beaver  Encounter Date: 10/07/2016      PT End of Session - 10/07/16 0914    Visit Number 14   Number of Visits 21   Date for PT Re-Evaluation 10/10/16   Authorization Type BCBS Other    Authorization Time Period 09/10/16 to 10/10/16   PT Start Time 0910  pt late for apt today   PT Stop Time 0948   PT Time Calculation (min) 38 min   Activity Tolerance Patient tolerated treatment well;No increased pain   Behavior During Therapy WFL for tasks assessed/performed      Past Medical History:  Diagnosis Date  . Back pain   . Body aches 11/21/2014  . BV (bacterial vaginosis) 05/23/2013  . Constipation 11/21/2014  . Elevated cholesterol 11/02/2013  . Fibroids 03/13/2016  . Hematuria 05/23/2013  . Hyperlipidemia   . Hypertension   . Migraines   . Pelvic pain in female 11/02/2013  . Plantar fasciitis of right foot   . Thyroid disease   . Vaginal discharge 03/24/2014  . Vaginal irritation 05/23/2013  . Vaginal itching 03/24/2014    Past Surgical History:  Procedure Laterality Date  . ECTOPIC PREGNANCY SURGERY    . ESOPHAGOGASTRODUODENOSCOPY  12/19/2009   HXT:AVWPVX stricture s/p dilation/mild gastritis  . ESOPHAGOGASTRODUODENOSCOPY N/A 12/10/2015   Procedure: ESOPHAGOGASTRODUODENOSCOPY (EGD);  Surgeon: Danie Binder, MD;  [facility];  Service: Endoscopy;  Laterality: N/A;  830  . ileocolonoscopy  12/19/2009   YIA:[id number] polyps/mild left-side diverticulosis/hemorrhoids  . KNEE SURGERY    . TUBAL LIGATION      There were no vitals filed for this visit.      Subjective Assessment - 10/07/16 0913    Subjective Pt reports she is feeling good today, no reports of foot pain today.  Does  continue to c/o intermittent Lt knee pain while descending stairs.   Pertinent History HTN, borderline DM, hypothyroidism, hx LBP, hx R knee surgery (plate and screws)   Patient Stated Goals get rid of pain, walk comfortably    Currently in Pain? No/denies            [facility] PT Assessment - 10/07/16 0001      Assessment   Medical Diagnosis plantar fasciitis    Referring Provider Caprice Beaver   Onset Date/Surgical Date --  January 2018   Next MD Visit Dr. Caprice Beaver in 4 weeks    Prior Therapy PT in 2016 for this problem      AROM   Right Ankle Dorsiflexion 6  was -3                     [facility] Adult PT Treatment/Exercise - 10/07/16 0001      Manual Therapy   Manual Therapy Soft tissue mobilization   Manual therapy comments manual complete separate rest of tx   Soft tissue mobilization gastroc/soleux complex and plantar fascia in prone     Ankle Exercises: Standing   Vector Stance Right;Left;5 reps;5 seconds;Limitations   SLS Rt 41", Lt 36"   Heel Raises 20 reps  Bil up, SLS down   Heel Walk (Round Trip) 2RT   Toe Walk (Round Trip) 2RT   Balance Beam tandem stance on solid surface then 2RT on balance beam  Other Standing Ankle Exercises tandem stance    Other Standing Ankle Exercises BLE SLS eccentric PF 15x; star gazer 5x 5" on dynadisc; Gait training 226 for improved toe push off     Ankle Exercises: Stretches   Slant Board Stretch 3 reps;30 seconds                  PT Short Term Goals - 09/12/16 0907      PT SHORT TERM GOAL #1   Title Patient to experience pain as being no more than 2/10 in weight bearing positions in order to improve QOL and work tolerance    Baseline 9/7: pain averages about 8-9/10   Time 3   Period Weeks   Status On-going     PT SHORT TERM GOAL #2   Title Patient to demonstrate an improvement of at least 10 degrees in B ankle dorsiflexion as well as inversion/eversion ROM as being equal between R and L feet in  order to improve mechancis and pain    Baseline 9/7: L DF 0 deg, R DF -3   Time 3   Period Weeks   Status On-going     PT SHORT TERM GOAL #3   Title Patient to demonstrate improved gait mechanics including reduction of antalgic pattern and heel toe gait in order to improve mobility and walking tolerance    Baseline 9/7; antalgia remains and decreased heel to toe   Time 3   Period Weeks   Status On-going     PT SHORT TERM GOAL #4   Title Patient to be compliant in correct performance of HEP, to be updated PRN    Baseline 9/7: has been consistent with it until recently   Time 1   Period Weeks   Status Partially Met           PT Long Term Goals - 09/12/16 0913      PT LONG TERM GOAL #1   Title Patient to demonstrate functional strength as being 5/5 in all tested groups in order to improve mechanics and reduce pain    Baseline 9/7: proximal hip strength still 4 to 4+/5   Time 6   Period Weeks   Status Partially Met     PT LONG TERM GOAL #2   Title Patient to be able to stand and walk for at least 4 hours without pain in order to improve work tolerance and QOL    Baseline 9/7: can stand and walk at work for 4 hours, but once she's home her pain increases   Time 6   Period Weeks   Status Achieved     PT LONG TERM GOAL #3   Title Patient to report that she has been able to return to regular walking and exercise program without pain in order to improve QOL    Baseline 9/7: has not returned to these due to the pain   Time 6   Period Weeks   Status On-going     PT LONG TERM GOAL #4   Title Patient to be compliant with advanced HEP to prevent recurrence of condition and maintain fucntional gains    Time 6   Period Weeks   Status On-going               Plan - 10/07/16 0935    Clinical Impression Statement Pt progressing well with presentation of improve gastroc strengthening with ability to reach full range with heel raises, does continue to show some weakness  with  SLS and eccentric control and fatigue with reps.  Increased focus this session wiht balance activities for intrinsic strengthening.  EOS with manual to address gastroc/soleus tightness.  No reports of pain.  Improved DF to 6 degrees.     Rehab Potential Good   Clinical Impairments Affecting Rehab Potential (+) success with PT in the past, motivated to participate; (-) recurrent condition, structural LLD, chronic hip and back pain    PT Frequency 2x / week   PT Duration 4 weeks   PT Treatment/Interventions ADLs/Self Care Home Management;Biofeedback;Cryotherapy;Electrical Stimulation;Iontophoresis 65m/ml Dexamethasone;Moist Heat;Gait training;Stair training;Functional mobility training;Therapeutic activities;Therapeutic exercise;Balance training;Neuromuscular re-education;Patient/family education;Orthotic Fit/Training;Manual techniques   PT Next Visit Plan Reassess next session.     PT Home Exercise Plan Eval: ankle circles, ankle alphabet, gastroc stretch, SKTC, corner stretch; plantar fascia stretch; heel and toe raises standing and SLS.      Patient will benefit from skilled therapeutic intervention in order to improve the following deficits and impairments:  Abnormal gait, Pain, Decreased coordination, Decreased strength, Decreased range of motion, Decreased balance, Difficulty walking, Impaired flexibility  Visit Diagnosis: Pain in left ankle and joints of left foot  Pain in right ankle and joints of right foot  Difficulty in walking, not elsewhere classified  Other symptoms and signs involving the musculoskeletal system     Problem List Patient Active Problem List   Diagnosis Date Noted  . RLQ abdominal pain 10/03/2016  . Yeast infection 03/13/2016  . Fibroids 03/13/2016  . Well woman exam with routine gynecological exam 11/28/2015  . Burning with urination 11/28/2015  . Subacute frontal sinusitis 11/28/2015  . Leg cramps 10/31/2015  . Chest pain 10/31/2015  . Varicose veins  of right lower extremity with complications 079/15/0569 . Body aches 11/21/2014  . Constipation 11/21/2014  . Vaginal itching 03/24/2014  . Vaginal discharge 03/24/2014  . Pelvic pain 11/02/2013  . Elevated cholesterol 11/02/2013  . Low back pain 10/20/2013  . Stiffness of ankle joint 10/20/2013  . Pain in joint, ankle and foot 10/20/2013  . Vaginal irritation 05/23/2013  . Hematuria 05/23/2013  . BV (bacterial vaginosis) 05/23/2013  . HEMATOCHEZIA 12/05/2009  . Dysphagia 12/05/2009  . CHEST WALL PAIN, ANTERIOR 06/23/2007  . LEG PAIN 05/18/2007  . VAGINAL PRURITUS 04/16/2007  . GOITER 03/10/2007  . HYPOTHYROIDISM 03/10/2007  . DIABETES MELLITUS, TYPE II 03/10/2007  . HYPERLIPIDEMIA 03/10/2007  . ANEMIA-IRON DEFICIENCY 03/10/2007  . HYPERTENSION 03/10/2007  . RHINITIS, CHRONIC 03/10/2007  . ASTHMA, CHILDHOOD 03/10/2007  . GERD 03/10/2007  . PEPTIC ULCER DISEASE 03/10/2007  . MENOPAUSAL SYNDROME 03/10/2007   CIhor Austin LBuck[place] CM[place] CAldona Lento10/02/2016, 1:07 PM  [place] [address]Phone: [phone]  Fax:  [phone] Name: Becky TrubyMRN: [id number] of Birth: 6August 14, 1962

## 2016-10-08 ENCOUNTER — Ambulatory Visit: Payer: BLUE CROSS/BLUE SHIELD | Admitting: Adult Health

## 2016-10-09 ENCOUNTER — Encounter (HOSPITAL_COMMUNITY): Payer: Self-pay | Admitting: Physical Therapy

## 2016-10-09 ENCOUNTER — Ambulatory Visit (HOSPITAL_COMMUNITY): Payer: BLUE CROSS/BLUE SHIELD | Admitting: Physical Therapy

## 2016-10-09 DIAGNOSIS — M25572 Pain in left ankle and joints of left foot: Secondary | ICD-10-CM | POA: Diagnosis not present

## 2016-10-09 DIAGNOSIS — R262 Difficulty in walking, not elsewhere classified: Secondary | ICD-10-CM

## 2016-10-09 DIAGNOSIS — M25571 Pain in right ankle and joints of right foot: Secondary | ICD-10-CM

## 2016-10-09 DIAGNOSIS — R29898 Other symptoms and signs involving the musculoskeletal system: Secondary | ICD-10-CM

## 2016-10-09 NOTE — Patient Instructions (Signed)
   LUNGE FORWARD - BOX (YOU DO NOT NEED TO GO AS LOW AS THE FELLOW IN THE PICTURE)  While standing on the ground with a step in front of you, place your foot foward and onto a step as shown.    Lunge forward, keeping your back leg straight and lunge forward, bending your right knee.  Return to the original position and then perform with the other leg.   Repeat 10 times each leg, 1 time a day.     SQUAT WITH HIP HINGE - HIP AND BACK DISASSOCIATION DRILL  When squatting, bend over at the waist, tighten your stomach muscles by drawing in your navel and keep your back straight while bending at your hips. This will protect your back from excessive loads.   Your buttock should lower behind your feet as if you are going to sit on a seat. Emphasize your weight going through your heels.   Also, for good knee alignment, do not let your knees pass in front of your toes and keep your knee in line with your 2nd toe (next to the big toe) as it bends.  Repeat 10 times, 1 time a day.    HIP HIKES  While standing up on a step, lower one leg downward towards the floor by tilting your pelvis to the side.   Then return the pelvis/leg back to a leveled position.  Repeat 10 times each leg, twice a day.

## 2016-10-09 NOTE — Therapy (Signed)
[facility] [address] [place], [place], [address] Phone: [phone]   Fax:  [phone]  Physical Therapy Treatment (Discharge)  Patient Details  Name: Becky [name] MRN: [id number] Date of Birth: [birth year] Referring Provider: Caprice Beaver  Encounter Date: 10/09/2016   PHYSICAL THERAPY DISCHARGE SUMMARY  Visits from Start of Care: 15  Current functional level related to goals / functional outcomes: See below    Remaining deficits: See below    Education / Equipment: See below  Plan: Patient agrees to discharge.  Patient goals were partially met. Patient is being discharged due to being pleased with the current functional level.  ?????            PT End of Session - 10/09/16 1024    Visit Number 15   Number of Visits 15   Date for PT Re-Evaluation 10/10/16   Authorization Type BC[employer] Other    Authorization Time Period 09/10/16 to 10/10/16   PT Start Time 0950   PT Stop Time 1015   PT Time Calculation (min) 25 min   Activity Tolerance Patient tolerated treatment well   Behavior During Therapy [facility] for tasks assessed/performed      Past Medical History:  Diagnosis Date  . Back pain   . Body aches 11/21/2014  . BV (bacterial vaginosis) 05/23/2013  . Constipation 11/21/2014  . Elevated cholesterol 11/02/2013  . Fibroids 03/13/2016  . Hematuria 05/23/2013  . Hyperlipidemia   . Hypertension   . Migraines   . Pelvic pain in female 11/02/2013  . Plantar fasciitis of right foot   . Thyroid disease   . Vaginal discharge 03/24/2014  . Vaginal irritation 05/23/2013  . Vaginal itching 03/24/2014    Past Surgical History:  Procedure Laterality Date  . ECTOPIC PREGNANCY SURGERY    . ESOPHAGOGASTRODUODENOSCOPY  12/19/2009   GQQ:PYPPJK stricture s/p dilation/mild gastritis  . ESOPHAGOGASTRODUODENOSCOPY N/A 12/10/2015   Procedure: ESOPHAGOGASTRODUODENOSCOPY (EGD);  Surgeon: Danie Binder, MD;  [facility];   Service: Endoscopy;  Laterality: N/A;  830  . ileocolonoscopy  12/19/2009   DTO:[id number] polyps/mild left-side diverticulosis/hemorrhoids  . KNEE SURGERY    . TUBAL LIGATION      There were no vitals filed for this visit.      Subjective Assessment - 10/09/16 0951    Subjective Patient reports she is feeling good, she is really more limited by her knee than her feet at this time. She did have one close call with falling at work, this happens every now and then. She is able tod what she needs and wants due to her feet right now.    Pertinent History HTN, borderline DM, hypothyroidism, hx LBP, hx R knee surgery (plate and screws)   How long can you sit comfortably? 10/4- unlmited    How long can you stand comfortably? 10/4- unlimited    How long can you walk comfortably? 10/4- does pretty good, 4-5 hours    Patient Stated Goals get rid of pain, walk comfortably    Currently in Pain? No/denies            [facility] PT Assessment - 10/09/16 0001      AROM   Right Ankle Dorsiflexion 5   Right Ankle Plantar Flexion --  full ROM    Right Ankle Inversion 36   Right Ankle Eversion 20   Left Ankle Dorsiflexion 6   Left Ankle Plantar Flexion --  full ROM    Left Ankle Inversion 35  Left Ankle Eversion 20     Strength   Right Hip Flexion 5/5   Right Hip Extension 5/5   Right Hip ABduction 4+/5   Left Hip Flexion 5/5   Left Hip Extension 4-/5   Left Hip ABduction 5/5   Right Knee Flexion 5/5   Right Knee Extension 5/5   Left Knee Flexion 5/5   Left Knee Extension 5/5   Right Ankle Dorsiflexion 5/5   Right Ankle Inversion 5/5   Right Ankle Eversion 5/5   Left Ankle Dorsiflexion 5/5   Left Ankle Inversion 5/5   Left Ankle Eversion 5/5     Ambulation/Gait   Gait Comments inversion B feet, mild scissoring pattern                              PT Education - 10/09/16 1024    Education provided Yes   Education Details exam findings, review of basic  strength exercises, DC today    Person(s) Educated Patient   Methods Explanation;Handout;Demonstration   Comprehension Verbalized understanding;Returned demonstration          PT Short Term Goals - 10/09/16 1003      PT SHORT TERM GOAL #1   Title Patient to experience pain as being no more than 2/10 in weight bearing positions in order to improve QOL and work tolerance    Baseline 10/4- usually 0/10   Time 3   Period Weeks   Status Achieved     PT SHORT TERM GOAL #2   Title Patient to demonstrate an improvement of at least 10 degrees in B ankle dorsiflexion as well as inversion/eversion ROM as being equal between R and L feet in order to improve mechancis and pain    Baseline 10/4- dorsiflexion remains stiff, inversion/eversion has met gola    Time 3   Period Weeks   Status Partially Met     PT SHORT TERM GOAL #3   Title Patient to demonstrate improved gait mechanics including reduction of antalgic pattern and heel toe gait in order to improve mobility and walking tolerance    Baseline 10/4- great improvement    Time 3   Period Weeks   Status Achieved     PT SHORT TERM GOAL #4   Title Patient to be compliant in correct performance of HEP, to be updated PRN    Baseline 10/4- doing something every day   Time 1   Period Weeks   Status Achieved           PT Long Term Goals - 10/09/16 1005      PT LONG TERM GOAL #1   Title Patient to demonstrate functional strength as being 5/5 in all tested groups in order to improve mechanics and reduce pain    Baseline 10/4- strength has greatly improved    Time 6   Period Weeks   Status Achieved     PT LONG TERM GOAL #2   Title Patient to be able to stand and walk for at least 4 hours without pain in order to improve work tolerance and QOL    Baseline 10/4- 4-5 hours    Time 6   Period Weeks   Status Achieved     PT LONG TERM GOAL #3   Title Patient to report that she has been able to return to regular walking and exercise  program without pain in order to improve QOL    Baseline  10/4- has not tried    Time 6   Period Weeks   Status On-going     PT LONG TERM GOAL #4   Title Patient to be compliant with advanced HEP to prevent recurrence of condition and maintain fucntional gains    Time 6   Period Weeks   Status On-going               Plan - 10/09/16 1025    Clinical Impression Statement Re-assessment performed today. Patient has made excellent progress in terms of her foot pain, and reports little to no impairment or pain due to her feet- her left knee is actually bothering her more at this point. Reviewed some basic LE strengthening exercises per patient request to try for her knee, however recommended that she speak to MD for more extensive knee evaluation if these do not change or increase her knee pain. DC today due to high level of function.    Rehab Potential Good   Clinical Impairments Affecting Rehab Potential (+) success with PT in the past, motivated to participate; (-) recurrent condition, structural LLD, chronic hip and back pain    PT Next Visit Plan DC today    PT Home Exercise Plan Eval: ankle circles, ankle alphabet, gastroc stretch, SKTC, corner stretch; plantar fascia stretch; heel and toe raises standing and SLS. 10/4= lunges, squats, hip hikes    Consulted and Agree with Plan of Care Patient      Patient will benefit from skilled therapeutic intervention in order to improve the following deficits and impairments:  Abnormal gait, Pain, Decreased coordination, Decreased strength, Decreased range of motion, Decreased balance, Difficulty walking, Impaired flexibility  Visit Diagnosis: Pain in left ankle and joints of left foot  Pain in right ankle and joints of right foot  Difficulty in walking, not elsewhere classified  Other symptoms and signs involving the musculoskeletal system     Problem List Patient Active Problem List   Diagnosis Date Noted  . RLQ abdominal pain  10/03/2016  . Yeast infection 03/13/2016  . Fibroids 03/13/2016  . Well woman exam with routine gynecological exam 11/28/2015  . Burning with urination 11/28/2015  . Subacute frontal sinusitis 11/28/2015  . Leg cramps 10/31/2015  . Chest pain 10/31/2015  . Varicose 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 09/38/1829  . Body aches 11/21/2014  . Constipation 11/21/2014  . Vaginal itching 03/24/2014  . Vaginal discharge 03/24/2014  . Pelvic pain 11/02/2013  . Elevated cholesterol 11/02/2013  . Low back pain 10/20/2013  . Stiffness of ankle joint 10/20/2013  . Pain in joint, ankle and foot 10/20/2013  . Vaginal irritation 05/23/2013  . Hematuria 05/23/2013  . BV (bacterial vaginosis) 05/23/2013  . HEMATOCHEZIA 12/05/2009  . Dysphagia 12/05/2009  . CHEST WALL PAIN, ANTERIOR 06/23/2007  . LEG PAIN 05/18/2007  . VAGINAL PRURITUS 04/16/2007  . GOITER 03/10/2007  . HYPOTHYROIDISM 03/10/2007  . DIABETES MELLITUS, TYPE II 03/10/2007  . HYPERLIPIDEMIA 03/10/2007  . ANEMIA-IRON DEFICIENCY 03/10/2007  . HYPERTENSION 03/10/2007  . RHINITIS, CHRONIC 03/10/2007  . ASTHMA, CHILDHOOD 03/10/2007  . GERD 03/10/2007  . PEPTIC ULCER DISEASE 03/10/2007  . MENOPAUSAL SYNDROME 03/10/2007    Deniece Ree PT, DPT [facility] [address] [place], [place], [address] Phone: [phone]   Fax:  [phone]  Name: Antonique Langford MRN: [id number] Date of Birth: 02-22-1960

## 2016-10-27 ENCOUNTER — Encounter (HOSPITAL_COMMUNITY): Payer: Self-pay | Admitting: *Deleted

## 2016-10-27 ENCOUNTER — Emergency Department (HOSPITAL_COMMUNITY)
Admission: EM | Admit: 2016-10-27 | Discharge: 2016-10-27 | Disposition: A | Discharge status: Home / Self Care | Payer: BLUE CROSS/BLUE SHIELD | Attending: Emergency Medicine | Admitting: Emergency Medicine

## 2016-10-27 ENCOUNTER — Emergency Department (HOSPITAL_COMMUNITY): Payer: BLUE CROSS/BLUE SHIELD

## 2016-10-27 DIAGNOSIS — Z87891 Personal history of nicotine dependence: Secondary | ICD-10-CM | POA: Insufficient documentation

## 2016-10-27 DIAGNOSIS — R110 Nausea: Secondary | ICD-10-CM

## 2016-10-27 DIAGNOSIS — I10 Essential (primary) hypertension: Secondary | ICD-10-CM | POA: Insufficient documentation

## 2016-10-27 DIAGNOSIS — Z79899 Other long term (current) drug therapy: Secondary | ICD-10-CM | POA: Insufficient documentation

## 2016-10-27 DIAGNOSIS — M25512 Pain in left shoulder: Secondary | ICD-10-CM | POA: Insufficient documentation

## 2016-10-27 DIAGNOSIS — E119 Type 2 diabetes mellitus without complications: Secondary | ICD-10-CM | POA: Diagnosis not present

## 2016-10-27 DIAGNOSIS — E039 Hypothyroidism, unspecified: Secondary | ICD-10-CM | POA: Diagnosis not present

## 2016-10-27 LAB — CBC WITH DIFFERENTIAL/PLATELET
Basophils Absolute: 0 10*3/uL (ref 0.0–0.1)
Basophils Relative: 0 %
Eosinophils Absolute: 0.2 10*3/uL (ref 0.0–0.7)
Eosinophils Relative: 2 %
HCT: 39.8 % (ref 36.0–46.0)
Hemoglobin: 13.1 g/dL (ref 12.0–15.0)
Lymphocytes Relative: 44 %
Lymphs Abs: 2.9 10*3/uL (ref 0.7–4.0)
MCH: 30.8 pg (ref 26.0–34.0)
MCHC: 32.9 g/dL (ref 30.0–36.0)
MCV: 93.4 fL (ref 78.0–100.0)
Monocytes Absolute: 0.8 10*3/uL (ref 0.1–1.0)
Monocytes Relative: 12 %
Neutro Abs: 2.8 10*3/uL (ref 1.7–7.7)
Neutrophils Relative %: 42 %
Platelets: 387 10*3/uL (ref 150–400)
RBC: 4.26 MIL/uL (ref 3.87–5.11)
RDW: 12.8 % (ref 11.5–15.5)
WBC: 6.7 10*3/uL (ref 4.0–10.5)

## 2016-10-27 LAB — COMPREHENSIVE METABOLIC PANEL
ALT: 24 U/L (ref 14–54)
AST: 20 U/L (ref 15–41)
Albumin: 4 g/dL (ref 3.5–5.0)
Alkaline Phosphatase: 66 U/L (ref 38–126)
Anion gap: 8 (ref 5–15)
BUN: 12 mg/dL (ref 6–20)
CO2: 28 mmol/L (ref 22–32)
Calcium: 9.5 mg/dL (ref 8.9–10.3)
Chloride: 104 mmol/L (ref 101–111)
Creatinine, Ser: 0.8 mg/dL (ref 0.44–1.00)
GFR calc Af Amer: >60 mL/min (ref >60)
GFR calc non Af Amer: >60 mL/min (ref >60)
Glucose, Bld: 92 mg/dL (ref 65–99)
Potassium: 3.7 mmol/L (ref 3.5–5.1)
Sodium: 140 mmol/L (ref 135–145)
Total Bilirubin: 0.7 mg/dL (ref 0.3–1.2)
Total Protein: 6.9 g/dL (ref 6.5–8.1)

## 2016-10-27 LAB — TROPONIN I: Troponin I: <0.03 ng/mL (ref <0.03)

## 2016-10-27 LAB — LIPASE, BLOOD: Lipase: 20 U/L (ref 11–51)

## 2016-10-27 MED ORDER — ONDANSETRON 4 MG PO TBDP: ORAL_TABLET | ORAL | 0 refills | Status: DC

## 2016-10-27 MED ORDER — PANTOPRAZOLE SODIUM 40 MG IV SOLR
40.0000 mg | Freq: Once | INTRAVENOUS | Status: AC
  Administered 2016-10-27: 40 mg via INTRAVENOUS
  Filled 2016-10-27: qty 40

## 2016-10-27 MED ORDER — ONDANSETRON HCL 4 MG/2ML IJ SOLN
4.0000 mg | Freq: Once | INTRAMUSCULAR | Status: AC
  Administered 2016-10-27: 4 mg via INTRAVENOUS
  Filled 2016-10-27: qty 2

## 2016-10-27 NOTE — ED Notes (Signed)
Patient transported to X-ray 

## 2016-10-27 NOTE — Discharge Instructions (Signed)
Increase your Dexilant stomach medicine to 1 pill twice a day and follow-up with your family doctor or Dr. fields next week .return if problems

## 2016-10-27 NOTE — ED Notes (Signed)
ED Provider at bedside. 

## 2016-10-27 NOTE — ED Notes (Signed)
Patient back from  X-ray 

## 2016-10-27 NOTE — ED Provider Notes (Signed)
[facility] EMERGENCY DEPARTMENT Provider Note   CSN: [id number] Arrival date & time: 10/27/16  1327     History   Chief Complaint Chief Complaint  Patient presents with  . Nausea  . Shoulder Pain    HPI Becky [name] is a 56 y.o. female.  Patient complains of some nausea and then had some pain in her left shoulder.  She stated when she worked he felt much better   The history is provided by the patient.  Shoulder Pain   This is a new problem. The current episode started 2 days ago. The problem occurs rarely. The problem has been resolved. The pain is present in the neck. The quality of the pain is described as aching. The pain is at a severity of 3/10. The pain is moderate. Pertinent negatives include full range of motion. Exacerbated by: Unknown.    Past Medical History:  Diagnosis Date  . Back pain   . Body aches 11/21/2014  . BV (bacterial vaginosis) 05/23/2013  . Constipation 11/21/2014  . Elevated cholesterol 11/02/2013  . Fibroids 03/13/2016  . Hematuria 05/23/2013  . Hyperlipidemia   . Hypertension   . Migraines   . Pelvic pain in female 11/02/2013  . Plantar fasciitis of right foot   . Thyroid disease   . Vaginal discharge 03/24/2014  . Vaginal irritation 05/23/2013  . Vaginal itching 03/24/2014    Patient Active Problem List   Diagnosis Date Noted  . RLQ abdominal pain 10/03/2016  . Yeast infection 03/13/2016  . Fibroids 03/13/2016  . Well woman exam with routine gynecological exam 11/28/2015  . Burning with urination 11/28/2015  . Subacute frontal sinusitis 11/28/2015  . Leg cramps 10/31/2015  . Chest pain 10/31/2015  . Varicose 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 00/76/2263  . Body aches 11/21/2014  . Constipation 11/21/2014  . Vaginal itching 03/24/2014  . Vaginal discharge 03/24/2014  . Pelvic pain 11/02/2013  . Elevated cholesterol 11/02/2013  . Low back pain 10/20/2013  . Stiffness of ankle joint 10/20/2013  . Pain in  joint, ankle and foot 10/20/2013  . Vaginal irritation 05/23/2013  . Hematuria 05/23/2013  . BV (bacterial vaginosis) 05/23/2013  . HEMATOCHEZIA 12/05/2009  . Dysphagia 12/05/2009  . CHEST WALL PAIN, ANTERIOR 06/23/2007  . LEG PAIN 05/18/2007  . VAGINAL PRURITUS 04/16/2007  . GOITER 03/10/2007  . HYPOTHYROIDISM 03/10/2007  . DIABETES MELLITUS, TYPE II 03/10/2007  . HYPERLIPIDEMIA 03/10/2007  . ANEMIA-IRON DEFICIENCY 03/10/2007  . HYPERTENSION 03/10/2007  . RHINITIS, CHRONIC 03/10/2007  . ASTHMA, CHILDHOOD 03/10/2007  . GERD 03/10/2007  . PEPTIC ULCER DISEASE 03/10/2007  . MENOPAUSAL SYNDROME 03/10/2007    Past Surgical History:  Procedure Laterality Date  . ECTOPIC PREGNANCY SURGERY    . ESOPHAGOGASTRODUODENOSCOPY  12/19/2009   FHL:[id number] stricture s/p dilation/mild gastritis  . ESOPHAGOGASTRODUODENOSCOPY N/A 12/10/2015   Procedure: ESOPHAGOGASTRODUODENOSCOPY (EGD);  Surgeon: Danie Binder, MD;  Location[facility];  Service: Endoscopy;  Laterality: N/A;  830  . ileocolonoscopy  12/19/2009   LSL:[id number] polyps/mild left-side diverticulosis/hemorrhoids  . KNEE SURGERY    . TUBAL LIGATION      OB History    Gravida Para Term Preterm AB Living   6 3     3 3    SAB TAB Ectopic Multiple Live Births       3   3       Home Medications    Prior to Admission medications   Medication Sig Start Date End Date Taking? Authorizing  Provider  acetaminophen (TYLENOL) 500 MG tablet Take 500 mg by mouth as needed for moderate pain or headache.    Yes [provider]  amLODipine (NORVASC) 10 MG tablet Take 10 mg by mouth daily. 09/21/14  Yes [provider]  ARMOUR THYROID 90 MG tablet Take 90 mg by mouth daily.  09/04/15  Yes [provider]  atorvastatin (LIPITOR) 40 MG tablet Take 1 tablet (40 mg total) by mouth daily. Patient taking differently: Take 40 mg by mouth every evening.  07/13/15  Yes Herminio Commons, MD  Cholecalciferol (VITAMIN D3)  10000 units TABS Take 10,000 tablets by mouth daily.    Yes [provider]  cyclobenzaprine (FLEXERIL) 5 MG tablet Take 1 tablet (5 mg total) by mouth 3 (three) times daily as needed for muscle spasms. 02/12/15  Yes Le, Thao P, DO  DEXILANT 60 MG capsule TAKE ONE (1) CAPSULE EACH DAY 09/16/16  Yes Carlis Stable, NP  DULoxetine (CYMBALTA) 30 MG capsule Take 30 mg by mouth 2 (two) times daily. 07/31/16  Yes [provider]  fluticasone (FLONASE) 50 MCG/ACT nasal spray Place 2 sprays into the nose as needed for allergies or rhinitis.    Yes [provider]  Glycerin-Polysorbate 80 (REFRESH DRY EYE THERAPY OP) Apply 1 drop to eye daily as needed (dry eyes).   Yes [provider]  ibuprofen (ADVIL,MOTRIN) 200 MG tablet Take 800 mg by mouth as needed for headache or moderate pain.    Yes [provider]  levocetirizine (XYZAL) 5 MG tablet Take 1 tablet by mouth daily. 09/07/16  Yes [provider]  linaclotide Rolan Lipa) 72 MCG capsule Take 1 capsule (72 mcg total) by mouth daily before breakfast. 10/03/16  Yes Mahala Menghini, PA-C  losartan (COZAAR) 100 MG tablet Take 100 mg by mouth daily. 09/21/14  Yes [provider]  Magnesium 400 MG CAPS Take 800 mg by mouth as needed (cramps).    Yes [provider]  montelukast (SINGULAIR) 10 MG tablet Take 1 tablet by mouth daily. 08/25/16  Yes [provider]  spironolactone (ALDACTONE) 25 MG tablet Take 1 tablet (25 mg total) by mouth daily. 01/30/16 10/27/16 Yes Herminio Commons, MD  ondansetron (ZOFRAN ODT) 4 MG disintegrating tablet 4mg  ODT q4 hours prn nausea/vomit 10/27/16   Milton Ferguson, MD    Family History Family History  Problem Relation Age of Onset  . Heart failure Mother   . Hypertension Mother   . Diabetes Mother   . Heart failure Father   . Hypertension Father   . Heart failure Brother   . Hypertension Sister   . Other Sister        blocked artery in neck; knee  replacement  . Other Brother        triple bypass surgery  . Hypertension Sister   . Diabetes Sister   . Colon cancer Neg Hx     Social History Social History  Substance Use Topics  . Smoking status: Former Smoker    Types: Cigarettes    Start date: 01/07/1975    Quit date: 01/06/1998  . Smokeless tobacco: Never Used  . Alcohol use No     Allergies   Hydralazine and Prednisone   Review of Systems Review of Systems  Constitutional: Negative for appetite change and fatigue.  HENT: Negative for congestion, ear discharge and sinus pressure.   Eyes: Negative for discharge.  Respiratory: Negative for cough.   Cardiovascular: Negative for chest pain.  Gastrointestinal: Positive for nausea. Negative for abdominal pain and diarrhea.  Genitourinary: Negative for frequency and hematuria.  Musculoskeletal: Negative for back pain.       Shoulder pain  Skin: Negative for rash.  Neurological: Negative for seizures and headaches.  Psychiatric/Behavioral: Negative for hallucinations.     Physical Exam Updated Vital Signs BP (!) 151/93 (BP Location: Right Arm)   Pulse 82   Temp 98.1 F (36.7 C) (Oral)   Resp 18   Ht 5\' 7"  (1.702 m)   Wt 108 kg (238 lb)   SpO2 94%   BMI 37.28 kg/m   Physical Exam  Constitutional: She is oriented to person, place, and time. She appears well-developed.  HENT:  Head: Normocephalic.  Eyes: Conjunctivae and EOM are normal. No scleral icterus.  Neck: Neck supple. No thyromegaly present.  Cardiovascular: Normal rate and regular rhythm.  Exam reveals no gallop and no friction rub.   No murmur heard. Pulmonary/Chest: No stridor. She has no wheezes. She has no rales. She exhibits no tenderness.  Abdominal: She exhibits no distension. There is no tenderness. There is no rebound.  Musculoskeletal: Normal range of motion. She exhibits no edema.  Lymphadenopathy:    She has no cervical adenopathy.  Neurological: She is oriented to person, place, and  time. She exhibits normal muscle tone. Coordination normal.  Skin: No rash noted. No erythema.  Psychiatric: She has a normal mood and affect. Her behavior is normal.     ED Treatments / Results  Labs (all labs ordered are listed, but only abnormal results are displayed) Labs Reviewed  CBC WITH DIFFERENTIAL/PLATELET  COMPREHENSIVE METABOLIC PANEL  TROPONIN I  LIPASE, BLOOD    EKG  EKG Interpretation  Date/Time:  Monday October 27 2016 13:42:16 EDT Ventricular Rate:  74 PR Interval:    QRS Duration: 108 QT Interval:  423 QTC Calculation: 470 R Axis:   124 Text Interpretation:  Sinus rhythm Right axis deviation Nonspecific T abnrm, anterolateral leads similar to previous tracing from 05/2015 Confirmed by Virgel Manifold [phone]) on 10/27/2016 1:51:48 PM       Radiology Dg Chest 2 View  Result Date: 10/27/2016 CLINICAL DATA:  Left shoulder pain and nausea. EXAM: CHEST  2 VIEW COMPARISON:  05/22/2015 FINDINGS: The lungs are clear without focal pneumonia, edema, pneumothorax or pleural effusion. Cardiopericardial silhouette is at upper limits of normal for size. The visualized bony structures of the thorax are intact. Telemetry leads overlie the chest. IMPRESSION: No active cardiopulmonary disease. Electronically Signed   By: Misty Stanley M.D.   On: 10/27/2016 14:55    Procedures Procedures (including critical care time)  Medications Ordered in ED Medications  pantoprazole (PROTONIX) injection 40 mg (40 mg Intravenous Given 10/27/16 1355)  ondansetron (ZOFRAN) injection 4 mg (4 mg Intravenous Given 10/27/16 1355)     Initial Impression / Assessment and Plan / ED Course  I have reviewed the triage vital signs and the nursing notes.  Pertinent labs & imaging results that were available during my care of the patient were reviewed by me and considered in my medical decision making (see chart for details).   Patient with nausea and shoulder pain that was improved by burping.   We will increase her protein pump inhibitor to twice a day and give her some nausea medicine and she will follow-up with her family doctor or GI    Final Clinical Impressions(s) / ED Diagnoses   Final diagnoses:  Nausea    New Prescriptions New Prescriptions  ONDANSETRON (ZOFRAN ODT) 4 MG DISINTEGRATING TABLET    4mg  ODT q4 hours prn nausea/vomit     Milton Ferguson, MD 10/27/16 1516

## 2016-10-27 NOTE — ED Triage Notes (Signed)
Pt c/o left shoulder pain and nausea that started today. Denies vomiting. Denies injury. Pt reports she has been belching and that helps her shoulder pain and nausea ease some.

## 2016-10-28 ENCOUNTER — Emergency Department (HOSPITAL_COMMUNITY)
Admission: EM | Admit: 2016-10-28 | Discharge: 2016-10-28 | Disposition: A | Discharge status: Home / Self Care | Payer: BLUE CROSS/BLUE SHIELD | Attending: Emergency Medicine | Admitting: Emergency Medicine

## 2016-10-28 ENCOUNTER — Encounter (HOSPITAL_COMMUNITY): Payer: Self-pay | Admitting: *Deleted

## 2016-10-28 ENCOUNTER — Telehealth: Payer: Self-pay | Admitting: Gastroenterology

## 2016-10-28 DIAGNOSIS — Z87891 Personal history of nicotine dependence: Secondary | ICD-10-CM | POA: Insufficient documentation

## 2016-10-28 DIAGNOSIS — I10 Essential (primary) hypertension: Secondary | ICD-10-CM | POA: Diagnosis not present

## 2016-10-28 DIAGNOSIS — E079 Disorder of thyroid, unspecified: Secondary | ICD-10-CM | POA: Insufficient documentation

## 2016-10-28 DIAGNOSIS — Z79899 Other long term (current) drug therapy: Secondary | ICD-10-CM | POA: Insufficient documentation

## 2016-10-28 DIAGNOSIS — R1013 Epigastric pain: Secondary | ICD-10-CM | POA: Insufficient documentation

## 2016-10-28 LAB — LIPASE, BLOOD: Lipase: 18 U/L (ref 11–51)

## 2016-10-28 LAB — COMPREHENSIVE METABOLIC PANEL
ALT: 24 U/L (ref 14–54)
AST: 18 U/L (ref 15–41)
Albumin: 4.3 g/dL (ref 3.5–5.0)
Alkaline Phosphatase: 68 U/L (ref 38–126)
Anion gap: 10 (ref 5–15)
BUN: 11 mg/dL (ref 6–20)
CO2: 27 mmol/L (ref 22–32)
Calcium: 9.8 mg/dL (ref 8.9–10.3)
Chloride: 102 mmol/L (ref 101–111)
Creatinine, Ser: 0.75 mg/dL (ref 0.44–1.00)
GFR calc Af Amer: >60 mL/min (ref >60)
GFR calc non Af Amer: >60 mL/min (ref >60)
Glucose, Bld: 119 mg/dL — ABNORMAL HIGH (ref 65–99)
Potassium: 3.9 mmol/L (ref 3.5–5.1)
Sodium: 139 mmol/L (ref 135–145)
Total Bilirubin: 0.6 mg/dL (ref 0.3–1.2)
Total Protein: 7.3 g/dL (ref 6.5–8.1)

## 2016-10-28 LAB — URINALYSIS, ROUTINE W REFLEX MICROSCOPIC
Bilirubin Urine: NEGATIVE
Glucose, UA: NEGATIVE mg/dL
Ketones, ur: NEGATIVE mg/dL
Leukocytes, UA: NEGATIVE
Nitrite: NEGATIVE
Protein, ur: NEGATIVE mg/dL
Specific Gravity, Urine: 1.008 (ref 1.005–1.030)
pH: 6 (ref 5.0–8.0)

## 2016-10-28 LAB — CBC
HCT: 41 % (ref 36.0–46.0)
Hemoglobin: 13.6 g/dL (ref 12.0–15.0)
MCH: 31.1 pg (ref 26.0–34.0)
MCHC: 33.2 g/dL (ref 30.0–36.0)
MCV: 93.6 fL (ref 78.0–100.0)
Platelets: 432 10*3/uL — ABNORMAL HIGH (ref 150–400)
RBC: 4.38 MIL/uL (ref 3.87–5.11)
RDW: 12.7 % (ref 11.5–15.5)
WBC: 8.5 10*3/uL (ref 4.0–10.5)

## 2016-10-28 NOTE — Telephone Encounter (Signed)
Spoke with pt and she is currently at the ER for pain in her upper abdomen. Pt was at the ER last night as well. Pt continues to feel full, burps and feel like vomit is going to come up. Pt is taking Dexilant once daily. Please advise

## 2016-10-28 NOTE — ED Triage Notes (Signed)
Pt seen for same yesterday. C/o intermittent sharp pain to epigastric area still.  LBM today and small amount per pt. + nausea, + belching, denies vomiting

## 2016-10-28 NOTE — Telephone Encounter (Signed)
PLEASE CALL PATIENT [phone], SEEN HERE  LAST MONTH AND WENT TO THE ER WITH [facility].  MADE HER AN APPT IN December.   IS THERE ANYTHING SHE CAN DO UNTIL THEN?

## 2016-10-29 ENCOUNTER — Ambulatory Visit (INDEPENDENT_AMBULATORY_CARE_PROVIDER_SITE_OTHER): Payer: BLUE CROSS/BLUE SHIELD

## 2016-10-29 DIAGNOSIS — K5900 Constipation, unspecified: Secondary | ICD-10-CM

## 2016-10-29 DIAGNOSIS — R109 Unspecified abdominal pain: Secondary | ICD-10-CM

## 2016-10-29 LAB — IFOBT (OCCULT BLOOD): IFOBT: NEGATIVE

## 2016-10-30 MED ORDER — RANITIDINE HCL 150 MG PO CAPS: 150.0000 mg | ORAL_CAPSULE | Freq: Every evening | ORAL | 5 refills | Status: DC

## 2016-10-30 NOTE — Telephone Encounter (Signed)
LMOM, Pt notified of stool findings, medication was sent to pts pharmacy. Rounting message to schedule f/u apt

## 2016-10-30 NOTE — Telephone Encounter (Addendum)
Reviewed available ER work up.  Looks like ruq u/s planned.   Would also recommend adding ranitidine 150mg  at evening meal. Continue Dexilant in am before breakfast.  Rx for ranitidine sent to pharmacy.  Also let her know that her stool was negative for blood.  Please schedule sooner f/u ov with [place].

## 2016-10-30 NOTE — Telephone Encounter (Signed)
Pt called office and was informed. 

## 2016-10-30 NOTE — Addendum Note (Signed)
Addended by: Mahala Menghini on: 10/30/2016 01:39 PM   Modules accepted: Orders

## 2016-11-03 NOTE — Telephone Encounter (Signed)
Rescheduled patient, and left message on her phone

## 2016-11-03 NOTE — Progress Notes (Signed)
Heme negative. Keep ov as planned.

## 2016-11-04 NOTE — ED Provider Notes (Signed)
[facility] EMERGENCY DEPARTMENT Provider Note   CSN: [id number] Arrival date & time: 10/28/16  1612     History   Chief Complaint Chief Complaint  Patient presents with  . Abdominal Pain    HPI Becky [name] is a 56 y.o. female.  HPI   47yF with abdominal pain. Seen in the ED yesterday for the same. No significant change in symptoms. Epigastric pain. Associated nausea. No postprandial change. Burping a lot. Intermittent. Sharp. Nausea.   Past Medical History:  Diagnosis Date  . Back pain   . Body aches 11/21/2014  . BV (bacterial vaginosis) 05/23/2013  . Constipation 11/21/2014  . Elevated cholesterol 11/02/2013  . Fibroids 03/13/2016  . Hematuria 05/23/2013  . Hyperlipidemia   . Hypertension   . Migraines   . Pelvic pain in female 11/02/2013  . Plantar fasciitis of right foot   . Thyroid disease   . Vaginal discharge 03/24/2014  . Vaginal irritation 05/23/2013  . Vaginal itching 03/24/2014    Patient Active Problem List   Diagnosis Date Noted  . RLQ abdominal pain 10/03/2016  . Yeast infection 03/13/2016  . Fibroids 03/13/2016  . Well woman exam with routine gynecological exam 11/28/2015  . Burning with urination 11/28/2015  . Subacute frontal sinusitis 11/28/2015  . Leg cramps 10/31/2015  . Chest pain 10/31/2015  . Varicose 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 44/31/5400  . Body aches 11/21/2014  . Constipation 11/21/2014  . Vaginal itching 03/24/2014  . Vaginal discharge 03/24/2014  . Pelvic pain 11/02/2013  . Elevated cholesterol 11/02/2013  . Low back pain 10/20/2013  . Stiffness of ankle joint 10/20/2013  . Pain in joint, ankle and foot 10/20/2013  . Vaginal irritation 05/23/2013  . Hematuria 05/23/2013  . BV (bacterial vaginosis) 05/23/2013  . HEMATOCHEZIA 12/05/2009  . Dysphagia 12/05/2009  . CHEST WALL PAIN, ANTERIOR 06/23/2007  . LEG PAIN 05/18/2007  . VAGINAL PRURITUS 04/16/2007  . GOITER 03/10/2007  . HYPOTHYROIDISM  03/10/2007  . DIABETES MELLITUS, TYPE II 03/10/2007  . HYPERLIPIDEMIA 03/10/2007  . ANEMIA-IRON DEFICIENCY 03/10/2007  . HYPERTENSION 03/10/2007  . RHINITIS, CHRONIC 03/10/2007  . ASTHMA, CHILDHOOD 03/10/2007  . GERD 03/10/2007  . PEPTIC ULCER DISEASE 03/10/2007  . MENOPAUSAL SYNDROME 03/10/2007    Past Surgical History:  Procedure Laterality Date  . ECTOPIC PREGNANCY SURGERY    . ESOPHAGOGASTRODUODENOSCOPY  12/19/2009   [id number] stricture s/p dilation/mild gastritis  . ESOPHAGOGASTRODUODENOSCOPY N/A 12/10/2015   Procedure: ESOPHAGOGASTRODUODENOSCOPY (EGD);  Surgeon: Danie Binder, MD;  Location: [facility];  Service: Endoscopy;  Laterality: N/A;  830  . ileocolonoscopy  12/19/2009   TOI:[id number] polyps/mild left-side diverticulosis/hemorrhoids  . KNEE SURGERY    . TUBAL LIGATION      OB History    Gravida Para Term Preterm AB Living   6 3     3 3    SAB TAB Ectopic Multiple Live Births       3   3       Home Medications    Prior to Admission medications   Medication Sig Start Date End Date Taking? Authorizing Provider  acetaminophen (TYLENOL) 500 MG tablet Take 500 mg by mouth as needed for moderate pain or headache.    Yes [provider]  amLODipine (NORVASC) 10 MG tablet Take 10 mg by mouth daily. 09/21/14  Yes [provider]  ARMOUR THYROID 90 MG tablet Take 90 mg by mouth daily.  09/04/15  Yes [provider]  Cholecalciferol (VITAMIN D3) 5000  units CAPS Take 2 capsules by mouth daily as needed (for Vitamin D defficiency).   Yes [provider]  DEXILANT 60 MG capsule TAKE ONE (1) CAPSULE EACH DAY 09/16/16  Yes Carlis Stable, NP  DULoxetine (CYMBALTA) 30 MG capsule Take 30 mg by mouth daily.  07/31/16  Yes [provider]  levocetirizine (XYZAL) 5 MG tablet Take 1 tablet by mouth daily. 09/07/16  Yes [provider]  linaclotide Rolan Lipa) 72 MCG capsule Take 1 capsule (72 mcg total) by mouth daily before  breakfast. 10/03/16  Yes Mahala Menghini, PA-C  losartan (COZAAR) 100 MG tablet Take 100 mg by mouth daily. 09/21/14  Yes [provider]  montelukast (SINGULAIR) 10 MG tablet Take 1 tablet by mouth daily. 08/25/16  Yes [provider]  ondansetron (ZOFRAN ODT) 4 MG disintegrating tablet 4mg  ODT q4 hours prn nausea/vomit Patient taking differently: Take 4 mg by mouth every 4 (four) hours as needed for nausea or vomiting.  10/27/16  Yes Milton Ferguson, MD  Glycerin-Polysorbate 80 (REFRESH DRY EYE THERAPY OP) Apply 1 drop to eye daily as needed (dry eyes).    [provider]  ranitidine (ZANTAC) 150 MG capsule Take 1 capsule (150 mg total) by mouth every evening. 10/30/16   Mahala Menghini, PA-C  spironolactone (ALDACTONE) 25 MG tablet Take 1 tablet (25 mg total) by mouth daily. 01/30/16 10/27/16  Herminio Commons, MD    Family History Family History  Problem Relation Age of Onset  . Heart failure Mother   . Hypertension Mother   . Diabetes Mother   . Heart failure Father   . Hypertension Father   . Heart failure Brother   . Hypertension Sister   . Other Sister        blocked artery in neck; knee replacement  . Other Brother        triple bypass surgery  . Hypertension Sister   . Diabetes Sister   . Colon cancer Neg Hx     Social History Social History  Substance Use Topics  . Smoking status: Former Smoker    Types: Cigarettes    Start date: 01/07/1975    Quit date: 01/06/1998  . Smokeless tobacco: Never Used  . Alcohol use No     Allergies   Hydralazine and Prednisone   Review of Systems Review of Systems  All systems reviewed and negative, other than as noted in HPI.  Physical Exam Updated Vital Signs BP (!) 153/84 (BP Location: Right Arm)   Pulse 64   Temp 98 F (36.7 C) (Oral)   Resp 18   Ht 5\' 7"  (1.702 m)   Wt 108 kg (238 lb)   SpO2 100%   BMI 37.28 kg/m   Physical Exam  Constitutional: She appears well-developed and  well-nourished. No distress.  HENT:  Head: Normocephalic and atraumatic.  Eyes: Conjunctivae are normal. Right eye exhibits no discharge. Left eye exhibits no discharge.  Neck: Neck supple.  Cardiovascular: Normal rate, regular rhythm and normal heart sounds.  Exam reveals no gallop and no friction rub.   No murmur heard. Pulmonary/Chest: Effort normal and breath sounds normal. No respiratory distress.  Abdominal: Soft. She exhibits no distension. There is no tenderness.  Musculoskeletal: She exhibits no edema or tenderness.  Neurological: She is alert.  Skin: Skin is warm and dry.  Psychiatric: She has a normal mood and affect. Her behavior is normal. Thought content normal.  Nursing note and vitals reviewed.  ED Treatments / Results  Labs (all labs ordered are listed, but only abnormal results are displayed) Labs Reviewed  COMPREHENSIVE METABOLIC PANEL - Abnormal; Notable for the following:       Result Value   Glucose, Bld 119 (*)    All other components within normal limits  CBC - Abnormal; Notable for the following:    Platelets 432 (*)    All other components within normal limits  URINALYSIS, ROUTINE W REFLEX MICROSCOPIC - Abnormal; Notable for the following:    Color, Urine STRAW (*)    Hgb urine dipstick MODERATE (*)    Bacteria, UA RARE (*)    Squamous Epithelial / LPF 0-5 (*)    All other components within normal limits  LIPASE, BLOOD    EKG  EKG Interpretation None       Radiology No results found.   Dg Chest 2 View  Result Date: 10/27/2016 CLINICAL DATA:  Left shoulder pain and nausea. EXAM: CHEST  2 VIEW COMPARISON:  05/22/2015 FINDINGS: The lungs are clear without focal pneumonia, edema, pneumothorax or pleural effusion. Cardiopericardial silhouette is at upper limits of normal for size. The visualized bony structures of the thorax are intact. Telemetry leads overlie the chest. IMPRESSION: No active cardiopulmonary disease. Electronically Signed   By:  Misty Stanley M.D.   On: 10/27/2016 14:55    Procedures Procedures (including critical care time)  Medications Ordered in ED Medications - No data to display   Initial Impression / Assessment and Plan / ED Course  I have reviewed the triage vital signs and the nursing notes.  Pertinent labs & imaging results that were available during my care of the patient were reviewed by me and considered in my medical decision making (see chart for details).     56yF with continued epigastric pain. Likely gastritis, PUD, GERD, etc. Doubt ACs or other emergent process.   Final Clinical Impressions(s) / ED Diagnoses   Final diagnoses:  Epigastric pain    New Prescriptions Discharge Medication List as of 10/28/2016  8:54 PM       Virgel Manifold, MD 11/04/16 1346

## 2016-11-04 NOTE — Progress Notes (Signed)
PT is aware.

## 2016-11-07 ENCOUNTER — Inpatient Hospital Stay (HOSPITAL_COMMUNITY)
Admission: EM | Admit: 2016-11-07 | Discharge: 2016-11-09 | DRG: 310 | Disposition: A | Discharge status: Home / Self Care | Payer: BLUE CROSS/BLUE SHIELD | Attending: Internal Medicine | Admitting: Internal Medicine

## 2016-11-07 ENCOUNTER — Other Ambulatory Visit: Payer: Self-pay

## 2016-11-07 ENCOUNTER — Encounter (HOSPITAL_COMMUNITY): Payer: Self-pay | Admitting: Emergency Medicine

## 2016-11-07 ENCOUNTER — Emergency Department (HOSPITAL_COMMUNITY): Payer: BLUE CROSS/BLUE SHIELD

## 2016-11-07 ENCOUNTER — Ambulatory Visit (HOSPITAL_COMMUNITY)
Admission: RE | Admit: 2016-11-07 | Discharge: 2016-11-07 | Disposition: A | Discharge status: Home / Self Care | Payer: BLUE CROSS/BLUE SHIELD | Source: Ambulatory Visit | Attending: Emergency Medicine | Admitting: Emergency Medicine

## 2016-11-07 DIAGNOSIS — Z87891 Personal history of nicotine dependence: Secondary | ICD-10-CM

## 2016-11-07 DIAGNOSIS — G43909 Migraine, unspecified, not intractable, without status migrainosus: Secondary | ICD-10-CM | POA: Diagnosis not present

## 2016-11-07 DIAGNOSIS — Z888 Allergy status to other drugs, medicaments and biological substances status: Secondary | ICD-10-CM

## 2016-11-07 DIAGNOSIS — R402413 Glasgow coma scale score 13-15, at hospital admission: Secondary | ICD-10-CM | POA: Diagnosis not present

## 2016-11-07 DIAGNOSIS — E785 Hyperlipidemia, unspecified: Secondary | ICD-10-CM | POA: Diagnosis not present

## 2016-11-07 DIAGNOSIS — R079 Chest pain, unspecified: Secondary | ICD-10-CM | POA: Diagnosis present

## 2016-11-07 DIAGNOSIS — I10 Essential (primary) hypertension: Secondary | ICD-10-CM | POA: Diagnosis present

## 2016-11-07 DIAGNOSIS — I2583 Coronary atherosclerosis due to lipid rich plaque: Secondary | ICD-10-CM | POA: Diagnosis not present

## 2016-11-07 DIAGNOSIS — I480 Paroxysmal atrial fibrillation: Secondary | ICD-10-CM | POA: Diagnosis not present

## 2016-11-07 DIAGNOSIS — I2510 Atherosclerotic heart disease of native coronary artery without angina pectoris: Secondary | ICD-10-CM

## 2016-11-07 DIAGNOSIS — M545 Low back pain: Secondary | ICD-10-CM | POA: Diagnosis not present

## 2016-11-07 DIAGNOSIS — I482 Chronic atrial fibrillation, unspecified: Secondary | ICD-10-CM | POA: Diagnosis present

## 2016-11-07 DIAGNOSIS — I4891 Unspecified atrial fibrillation: Secondary | ICD-10-CM | POA: Diagnosis not present

## 2016-11-07 DIAGNOSIS — Z79899 Other long term (current) drug therapy: Secondary | ICD-10-CM | POA: Diagnosis not present

## 2016-11-07 DIAGNOSIS — Z792 Long term (current) use of antibiotics: Secondary | ICD-10-CM

## 2016-11-07 DIAGNOSIS — Z886 Allergy status to analgesic agent status: Secondary | ICD-10-CM

## 2016-11-07 DIAGNOSIS — K219 Gastro-esophageal reflux disease without esophagitis: Secondary | ICD-10-CM | POA: Diagnosis not present

## 2016-11-07 DIAGNOSIS — K760 Fatty (change of) liver, not elsewhere classified: Secondary | ICD-10-CM | POA: Diagnosis not present

## 2016-11-07 LAB — CBC
HCT: 41.3 % (ref 36.0–46.0)
Hemoglobin: 13.8 g/dL (ref 12.0–15.0)
MCH: 31 pg (ref 26.0–34.0)
MCHC: 33.4 g/dL (ref 30.0–36.0)
MCV: 92.8 fL (ref 78.0–100.0)
Platelets: 398 10*3/uL (ref 150–400)
RBC: 4.45 MIL/uL (ref 3.87–5.11)
RDW: 12.8 % (ref 11.5–15.5)
WBC: 9.7 10*3/uL (ref 4.0–10.5)

## 2016-11-07 LAB — BASIC METABOLIC PANEL
Anion gap: 10 (ref 5–15)
BUN: 12 mg/dL (ref 6–20)
CO2: 26 mmol/L (ref 22–32)
Calcium: 10 mg/dL (ref 8.9–10.3)
Chloride: 104 mmol/L (ref 101–111)
Creatinine, Ser: 0.79 mg/dL (ref 0.44–1.00)
GFR calc Af Amer: >60 mL/min (ref >60)
GFR calc non Af Amer: >60 mL/min (ref >60)
Glucose, Bld: 93 mg/dL (ref 65–99)
Potassium: 4.1 mmol/L (ref 3.5–5.1)
Sodium: 140 mmol/L (ref 135–145)

## 2016-11-07 LAB — POCT I-STAT TROPONIN I: Troponin i, poc: 0 ng/mL (ref 0.00–0.08)

## 2016-11-07 LAB — TROPONIN I: Troponin I: <0.03 ng/mL (ref <0.03)

## 2016-11-07 LAB — MAGNESIUM: Magnesium: 1.9 mg/dL (ref 1.7–2.4)

## 2016-11-07 LAB — TSH: TSH: 3.07 u[IU]/mL (ref 0.350–4.500)

## 2016-11-07 LAB — D-DIMER, QUANTITATIVE: D-Dimer, Quant: 0.37 ug/mL-FEU (ref 0.00–0.50)

## 2016-11-07 LAB — T4, FREE: Free T4: 0.67 ng/dL (ref 0.61–1.12)

## 2016-11-07 MED ORDER — SODIUM CHLORIDE 0.9 % IV SOLN
INTRAVENOUS | Status: DC
  Administered 2016-11-07: 18:00:00 via INTRAVENOUS

## 2016-11-07 MED ORDER — PANTOPRAZOLE SODIUM 40 MG PO TBEC
40.0000 mg | DELAYED_RELEASE_TABLET | Freq: Every day | ORAL | Status: DC
  Administered 2016-11-08 – 2016-11-09 (×2): 40 mg via ORAL
  Filled 2016-11-07 (×2): qty 1

## 2016-11-07 MED ORDER — MAGNESIUM SULFATE IN D5W 1-5 GM/100ML-% IV SOLN
1.0000 g | Freq: Once | INTRAVENOUS | Status: AC
  Administered 2016-11-08: 1 g via INTRAVENOUS
  Filled 2016-11-07: qty 100

## 2016-11-07 MED ORDER — DULOXETINE HCL 30 MG PO CPEP
30.0000 mg | ORAL_CAPSULE | Freq: Every day | ORAL | Status: DC
  Administered 2016-11-08 – 2016-11-09 (×2): 30 mg via ORAL
  Filled 2016-11-07 (×2): qty 1

## 2016-11-07 MED ORDER — LEVOCETIRIZINE DIHYDROCHLORIDE 5 MG PO TABS: 5.0000 mg | ORAL_TABLET | Freq: Every day | ORAL | Status: DC

## 2016-11-07 MED ORDER — THYROID 60 MG PO TABS
90.0000 mg | ORAL_TABLET | Freq: Every day | ORAL | Status: DC
  Filled 2016-11-07 (×3): qty 1

## 2016-11-07 MED ORDER — ACETAMINOPHEN 325 MG PO TABS
650.0000 mg | ORAL_TABLET | Freq: Once | ORAL | Status: AC
  Administered 2016-11-07: 650 mg via ORAL
  Filled 2016-11-07: qty 2

## 2016-11-07 MED ORDER — DILTIAZEM LOAD VIA INFUSION
20.0000 mg | Freq: Once | INTRAVENOUS | Status: AC
  Administered 2016-11-07: 20 mg via INTRAVENOUS
  Filled 2016-11-07: qty 20

## 2016-11-07 MED ORDER — ACETAMINOPHEN 325 MG PO TABS
650.0000 mg | ORAL_TABLET | ORAL | Status: DC | PRN
  Administered 2016-11-08 (×3): 650 mg via ORAL
  Filled 2016-11-07 (×3): qty 2

## 2016-11-07 MED ORDER — AMLODIPINE BESYLATE 5 MG PO TABS
10.0000 mg | ORAL_TABLET | Freq: Every day | ORAL | Status: DC
  Administered 2016-11-08: 10 mg via ORAL
  Filled 2016-11-07: qty 2

## 2016-11-07 MED ORDER — LOSARTAN POTASSIUM 50 MG PO TABS
100.0000 mg | ORAL_TABLET | Freq: Every day | ORAL | Status: DC
  Administered 2016-11-08 – 2016-11-09 (×2): 100 mg via ORAL
  Filled 2016-11-07 (×2): qty 2

## 2016-11-07 MED ORDER — MORPHINE SULFATE (PF) 2 MG/ML IV SOLN: 2.0000 mg | INTRAVENOUS | Status: DC | PRN

## 2016-11-07 MED ORDER — ENSURE ENLIVE PO LIQD: 237.0000 mL | Freq: Two times a day (BID) | ORAL | Status: DC

## 2016-11-07 MED ORDER — HYDROCODONE-ACETAMINOPHEN 5-325 MG PO TABS
1.0000 | ORAL_TABLET | ORAL | Status: DC | PRN
  Administered 2016-11-08: 1 via ORAL
  Filled 2016-11-07: qty 2

## 2016-11-07 MED ORDER — MONTELUKAST SODIUM 10 MG PO TABS
10.0000 mg | ORAL_TABLET | Freq: Every day | ORAL | Status: DC
  Administered 2016-11-08 – 2016-11-09 (×2): 10 mg via ORAL
  Filled 2016-11-07 (×2): qty 1

## 2016-11-07 MED ORDER — ENOXAPARIN SODIUM 120 MG/0.8ML ~~LOC~~ SOLN
110.0000 mg | Freq: Two times a day (BID) | SUBCUTANEOUS | Status: DC
  Administered 2016-11-08: 110 mg via SUBCUTANEOUS
  Filled 2016-11-07 (×5): qty 0.8

## 2016-11-07 MED ORDER — ONDANSETRON HCL 4 MG/2ML IJ SOLN: 4.0000 mg | Freq: Four times a day (QID) | INTRAMUSCULAR | Status: DC | PRN

## 2016-11-07 MED ORDER — ALPRAZOLAM 0.25 MG PO TABS
0.2500 mg | ORAL_TABLET | Freq: Two times a day (BID) | ORAL | Status: DC | PRN
  Administered 2016-11-08: 0.25 mg via ORAL
  Filled 2016-11-07: qty 1

## 2016-11-07 MED ORDER — MORPHINE SULFATE (PF) 4 MG/ML IV SOLN
4.0000 mg | Freq: Once | INTRAVENOUS | Status: AC
  Administered 2016-11-07: 4 mg via INTRAVENOUS
  Filled 2016-11-07: qty 1

## 2016-11-07 MED ORDER — THYROID 60 MG PO TABS: 90.0000 mg | ORAL_TABLET | Freq: Every day | ORAL | Status: DC

## 2016-11-07 MED ORDER — SPIRONOLACTONE 25 MG PO TABS
25.0000 mg | ORAL_TABLET | Freq: Every day | ORAL | Status: DC
  Administered 2016-11-08 – 2016-11-09 (×2): 25 mg via ORAL
  Filled 2016-11-07 (×2): qty 1

## 2016-11-07 MED ORDER — DILTIAZEM HCL-DEXTROSE 100-5 MG/100ML-% IV SOLN (PREMIX)
5.0000 mg/h | INTRAVENOUS | Status: DC
  Administered 2016-11-07: 5 mg/h via INTRAVENOUS
  Administered 2016-11-08: 2.5 mg/h via INTRAVENOUS
  Filled 2016-11-07 (×2): qty 100

## 2016-11-07 MED ORDER — ONDANSETRON HCL 4 MG/2ML IJ SOLN
4.0000 mg | Freq: Once | INTRAMUSCULAR | Status: AC
  Administered 2016-11-07: 4 mg via INTRAVENOUS
  Filled 2016-11-07: qty 2

## 2016-11-07 MED ORDER — LINACLOTIDE 72 MCG PO CAPS
72.0000 ug | ORAL_CAPSULE | Freq: Every day | ORAL | Status: DC
  Administered 2016-11-08 – 2016-11-09 (×2): 72 ug via ORAL
  Filled 2016-11-07 (×3): qty 1

## 2016-11-07 MED ORDER — INFLUENZA VAC SPLIT QUAD 0.5 ML IM SUSY
0.5000 mL | PREFILLED_SYRINGE | INTRAMUSCULAR | Status: DC
  Filled 2016-11-07: qty 0.5

## 2016-11-07 MED ORDER — FAMOTIDINE 20 MG PO TABS
20.0000 mg | ORAL_TABLET | Freq: Every day | ORAL | Status: DC
  Administered 2016-11-08 – 2016-11-09 (×2): 20 mg via ORAL
  Filled 2016-11-07 (×2): qty 1

## 2016-11-07 MED ORDER — ENOXAPARIN SODIUM 120 MG/0.8ML ~~LOC~~ SOLN
1.0000 mg/kg | Freq: Once | SUBCUTANEOUS | Status: AC
  Administered 2016-11-07: 110 mg via SUBCUTANEOUS
  Filled 2016-11-07: qty 0.8

## 2016-11-07 MED ORDER — LORATADINE 10 MG PO TABS: 10.0000 mg | ORAL_TABLET | Freq: Every day | ORAL | Status: DC

## 2016-11-07 NOTE — ED Provider Notes (Signed)
[facility] EMERGENCY DEPARTMENT Provider Note   CSN: [id number] Arrival date & time: 11/07/16  1701     History   Chief Complaint Chief Complaint  Patient presents with  . Chest Pain    HPI Becky [name] is a 56 y.o. female.  HPI Presents to the emergency room for evaluation of sharp right-sided chest pain.  Patient states she started having chest pain a couple of days ago.  It is a pulling sharp sensation on the right side of her chest and under her right arm.  She has had some pain with deep breathing but denies feeling short of breath.  Patient has not noticed palpitations or tachycardia.  He has no history of heart disease, PE or irregular heart rhythms.  No fevers or chills.  No coughing.  No vomiting or diarrhea. Past Medical History:  Diagnosis Date  . Back pain   . Body aches 11/21/2014  . BV (bacterial vaginosis) 05/23/2013  . Constipation 11/21/2014  . Elevated cholesterol 11/02/2013  . Fibroids 03/13/2016  . Hematuria 05/23/2013  . Hyperlipidemia   . Hypertension   . Migraines   . Pelvic pain in female 11/02/2013  . Plantar fasciitis of right foot   . Thyroid disease   . Vaginal discharge 03/24/2014  . Vaginal irritation 05/23/2013  . Vaginal itching 03/24/2014    Patient Active Problem List   Diagnosis Date Noted  . RLQ abdominal pain 10/03/2016  . Yeast infection 03/13/2016  . Fibroids 03/13/2016  . Well woman exam with routine gynecological exam 11/28/2015  . Burning with urination 11/28/2015  . Subacute frontal sinusitis 11/28/2015  . Leg cramps 10/31/2015  . Chest pain 10/31/2015  . Varicose 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 83/15/1761  . Body aches 11/21/2014  . Constipation 11/21/2014  . Vaginal itching 03/24/2014  . Vaginal discharge 03/24/2014  . Pelvic pain 11/02/2013  . Elevated cholesterol 11/02/2013  . Low back pain 10/20/2013  . Stiffness of ankle joint 10/20/2013  . Pain in joint, ankle and foot 10/20/2013  . Vaginal  irritation 05/23/2013  . Hematuria 05/23/2013  . BV (bacterial vaginosis) 05/23/2013  . HEMATOCHEZIA 12/05/2009  . Dysphagia 12/05/2009  . CHEST WALL PAIN, ANTERIOR 06/23/2007  . LEG PAIN 05/18/2007  . VAGINAL PRURITUS 04/16/2007  . GOITER 03/10/2007  . HYPOTHYROIDISM 03/10/2007  . DIABETES MELLITUS, TYPE II 03/10/2007  . HYPERLIPIDEMIA 03/10/2007  . ANEMIA-IRON DEFICIENCY 03/10/2007  . HYPERTENSION 03/10/2007  . RHINITIS, CHRONIC 03/10/2007  . ASTHMA, CHILDHOOD 03/10/2007  . GERD 03/10/2007  . PEPTIC ULCER DISEASE 03/10/2007  . MENOPAUSAL SYNDROME 03/10/2007    Past Surgical History:  Procedure Laterality Date  . ECTOPIC PREGNANCY SURGERY    . ESOPHAGOGASTRODUODENOSCOPY  12/19/2009   [id number] stricture s/p dilation/mild gastritis  . ESOPHAGOGASTRODUODENOSCOPY N/A 12/10/2015   Procedure: ESOPHAGOGASTRODUODENOSCOPY (EGD);  Surgeon: Danie Binder, MD;  Location: [facility];  Service: Endoscopy;  Laterality: N/A;  830  . ileocolonoscopy  12/19/2009   IRS:[id number] polyps/mild left-side diverticulosis/hemorrhoids  . KNEE SURGERY    . TUBAL LIGATION      OB History    Gravida Para Term Preterm AB Living   6 3     3 3    SAB TAB Ectopic Multiple Live Births       3   3       Home Medications    Prior to Admission medications   Medication Sig Start Date End Date Taking? Authorizing Provider  amLODipine (NORVASC) 10 MG tablet Take 10  mg by mouth daily. 09/21/14  Yes [provider]  ARMOUR THYROID 90 MG tablet Take 90 mg by mouth daily.  09/04/15  Yes [provider]  Cholecalciferol (VITAMIN D3) 5000 units CAPS Take 2 capsules by mouth daily as needed (for Vitamin D defficiency).   Yes [provider]  DEXILANT 60 MG capsule TAKE ONE (1) CAPSULE EACH DAY 09/16/16  Yes Carlis Stable, NP  DULoxetine (CYMBALTA) 30 MG capsule Take 30 mg by mouth daily.  07/31/16  Yes [provider]  levocetirizine (XYZAL) 5 MG tablet Take 1 tablet by  mouth daily. 09/07/16  Yes [provider]  linaclotide Rolan Lipa) 72 MCG capsule Take 1 capsule (72 mcg total) by mouth daily before breakfast. 10/03/16  Yes Mahala Menghini, PA-C  losartan (COZAAR) 100 MG tablet Take 100 mg by mouth daily. 09/21/14  Yes [provider]  montelukast (SINGULAIR) 10 MG tablet Take 1 tablet by mouth daily. 08/25/16  Yes [provider]  ondansetron (ZOFRAN) 4 MG tablet Take 4 mg by mouth every 8 (eight) hours as needed for nausea or vomiting.   Yes [provider]  ranitidine (ZANTAC) 150 MG capsule Take 1 capsule (150 mg total) by mouth every evening. 10/30/16  Yes Mahala Menghini, PA-C  spironolactone (ALDACTONE) 25 MG tablet Take 1 tablet (25 mg total) by mouth daily. 01/30/16 11/07/16 Yes Herminio Commons, MD  acetaminophen (TYLENOL) 500 MG tablet Take 500 mg by mouth as needed for moderate pain or headache.     [provider]  clindamycin (CLEOCIN) 150 MG capsule Take 1 capsule by mouth 3 (three) times daily. 11/06/16   [provider]  Glycerin-Polysorbate 80 (REFRESH DRY EYE THERAPY OP) Apply 1 drop to eye daily as needed (dry eyes).    [provider]    Family History Family History  Problem Relation Age of Onset  . Heart failure Mother   . Hypertension Mother   . Diabetes Mother   . Heart failure Father   . Hypertension Father   . Heart failure Brother   . Hypertension Sister   . Other Sister        blocked artery in neck; knee replacement  . Other Brother        triple bypass surgery  . Hypertension Sister   . Diabetes Sister   . Colon cancer Neg Hx     Social History Social History  Substance Use Topics  . Smoking status: Former Smoker    Types: Cigarettes    Start date: 01/07/1975    Quit date: 01/06/1998  . Smokeless tobacco: Never Used  . Alcohol use No     Allergies   Hydralazine and Prednisone   Review of Systems Review of Systems  All other systems reviewed and are  negative.    Physical Exam Updated Vital Signs BP (!) 152/96   Pulse 97   Temp 98.2 F (36.8 C) (Oral)   Resp 19   Ht 1.702 m (5\' 7" )   Wt 108 kg (238 lb)   SpO2 94%   BMI 37.28 kg/m   Physical Exam  Constitutional: She appears well-developed and well-nourished. No distress.  HENT:  Head: Normocephalic and atraumatic.  Right Ear: External ear normal.  Left Ear: External ear normal.  Eyes: Conjunctivae are normal. Right eye exhibits no discharge. Left eye exhibits no discharge. No scleral icterus.  Neck: Neck supple. No tracheal deviation present.  Cardiovascular: Intact distal pulses.  An irregularly irregular  rhythm present. Tachycardia present.   Pulmonary/Chest: Effort normal and breath sounds normal. No stridor. No respiratory distress. She has no wheezes. She has no rales.  Abdominal: Soft. Bowel sounds are normal. She exhibits no distension. There is no tenderness. There is no rebound and no guarding.  Musculoskeletal: She exhibits no edema or tenderness.  Neurological: She is alert. She has normal strength. No cranial nerve deficit (no facial droop, extraocular movements intact, no slurred speech) or sensory deficit. She exhibits normal muscle tone. She displays no seizure activity. Coordination normal.  Skin: Skin is warm and dry. No rash noted.  Psychiatric: She has a normal mood and affect.  Nursing note and vitals reviewed.    ED Treatments / Results  Labs (all labs ordered are listed, but only abnormal results are displayed) Labs Reviewed  BASIC METABOLIC PANEL  CBC  MAGNESIUM  TSH  D-DIMER, QUANTITATIVE (NOT AT [facility])  T4, FREE  I-STAT TROPONIN, ED  POCT I-STAT TROPONIN I    EKG  EKG Interpretation  Date/Time:  Friday November 07 2016 17:10:08 EDT Ventricular Rate:  136 PR Interval:    QRS Duration: 88 QT Interval:  302 QTC Calculation: 528 R Axis:   45 Text Interpretation:  Atrial fibrillation with rapid ventricular response Abnormal ECG Atrial  fibrillation is new since last tracing Confirmed by Dorie Rank [phone]) on 11/07/2016 5:23:58 PM       Radiology Dg Chest 2 View  Result Date: 11/07/2016 CLINICAL DATA:  56 year old female with chest pain. EXAM: CHEST  2 VIEW COMPARISON:  Chest radiograph dated 10/27/2016 FINDINGS: The heart size and mediastinal contours are within normal limits. Both lungs are clear. The visualized skeletal structures are unremarkable. IMPRESSION: No active cardiopulmonary disease. Electronically Signed   By: Anner Crete M.D.   On: 11/07/2016 19:00   US Abdomen Limited Ruq/gall Gladder  Result Date: 11/07/2016 CLINICAL DATA:  56 year old female with acute right upper quadrant pain for 1 week. EXAM: ULTRASOUND ABDOMEN LIMITED RIGHT UPPER QUADRANT COMPARISON:  07/30/2006 ultrasound FINDINGS: Gallbladder: The gallbladder is unremarkable. There is no evidence of cholelithiasis or acute cholecystitis. Common bile duct: Diameter: 2.3 mm. No intrahepatic or extrahepatic biliary dilatation. Liver: Diffusely increased hepatic echogenicity noted. No focal abnormalities are present. Portal vein is patent on color Doppler imaging with normal direction of blood flow towards the liver. IMPRESSION: 1. No evidence of acute abnormality 2. Hepatic steatosis Electronically Signed   By: Margarette Canada M.D.   On: 11/07/2016 10:51    Procedures Procedures (including critical care time)  Medications Ordered in ED Medications  0.9 %  sodium chloride infusion ( Intravenous New Bag/Given 11/07/16 1809)  diltiazem (CARDIZEM) 1 mg/mL load via infusion 20 mg (20 mg Intravenous Bolus from Bag 11/07/16 1811)    And  diltiazem (CARDIZEM) 100 mg in dextrose 5% 169mL (1 mg/mL) infusion (10 mg/hr Intravenous Rate/Dose Change 11/07/16 1931)  acetaminophen (TYLENOL) tablet 650 mg (not administered)  morphine 4 MG/ML injection 4 mg (4 mg Intravenous Given 11/07/16 1923)  ondansetron (ZOFRAN) injection 4 mg (4 mg Intravenous Given 11/07/16 1923)      Initial Impression / Assessment and Plan / ED Course  I have reviewed the triage vital signs and the nursing notes.  Pertinent labs & imaging results that were available during my care of the patient were reviewed by me and considered in my medical decision making (see chart for details).  Clinical Course as of Nov 08 2038  Fri Nov 07, 2016  1832 Heart rate  has decreased.  Now into the 100s however patient continues to have chest pain.  Will add on d dimer.  Pain meds ordered  [JK]    Clinical Course User Index [JK] Dorie Rank, MD    Patient presented with chest pain.  In the ED atrial fibrillation with a rapid response.  Patient was started on Cardizem with improvement in her rate.  Patient continued to have pleuritic chest pain despite resolution of her tachycardia.  D-dimer was ordered however this is negative.  She has no PE risk factors otherwise.   Chads vasc score =2.  I will consult with the medical service for admission and further treatment.  Consider cardiology consultation while in the hospital.  Final Clinical Impressions(s) / ED Diagnoses   Final diagnoses:  Atrial fibrillation, rapid ([place])  Chest pain, unspecified type      Dorie Rank, MD 11/07/16 2041

## 2016-11-07 NOTE — Progress Notes (Signed)
ANTICOAGULATION CONSULT NOTE - Initial Consult  Pharmacy Consult for lovenox Indication: atrial fibrillation  Allergies  Allergen Reactions  . Hydralazine Nausea Only  . Prednisone Swelling    Patient Measurements: Height: 5\' 7"  (170.2 cm) Weight: 238 lb (108 kg) IBW/kg (Calculated) : 61.6 Heparin Dosing Weight: 108 kg  Vital Signs: Temp: 98.2 F (36.8 C) (11/02 1715) Temp Source: Oral (11/02 1715) BP: 139/94 (11/02 2103) Pulse Rate: 81 (11/02 2103)  Labs:  Recent Labs  11/07/16 1735  HGB 13.8  HCT 41.3  PLT 398  CREATININE 0.79    Estimated Creatinine Clearance: 100.6 mL/min (by C-G formula based on SCr of 0.79 mg/dL).   Medical History: Past Medical History:  Diagnosis Date  . Back pain   . Body aches 11/21/2014  . BV (bacterial vaginosis) 05/23/2013  . Constipation 11/21/2014  . Elevated cholesterol 11/02/2013  . Fibroids 03/13/2016  . Hematuria 05/23/2013  . Hyperlipidemia   . Hypertension   . Migraines   . Pelvic pain in female 11/02/2013  . Plantar fasciitis of right foot   . Thyroid disease   . Vaginal discharge 03/24/2014  . Vaginal irritation 05/23/2013  . Vaginal itching 03/24/2014    Medications:  See medication history  Assessment: 56 yo lady to start lovenox for CP, afib.  She was not on anticoagulation PTA Goal of Therapy:  Anti-Xa level 0.6-1 units/ml 4hrs after LMWH dose given Monitor platelets by anticoagulation protocol: Yes   Plan:  Lovenox 110 mg sq q12 hours F/u CBC.  Monitor for bleeding complications  Rohan Juenger Poteet 11/07/2016,9:17 PM

## 2016-11-07 NOTE — ED Triage Notes (Signed)
Patient c/o right side chest pain that radiates under right arm. Patient denies shortness of breath but states "It hurts when I take a deep breath." Patient reports seeing cardiologist but denies any MIs or stent. Patient stats she does feel light headed. EKG done in traige. A-fib with rate of 136. Denies hx of a-fib.

## 2016-11-07 NOTE — ED Notes (Signed)
Pt reports lifting weights 2 days ago and felt a pulling sensation in her right breast that radiates into her right under arm. Pt reports it hurts to take a deep breath and has been feeling this way ever since. Pt is in a-fib but states she has never been diagnosed.

## 2016-11-07 NOTE — ED Notes (Signed)
Pt reports that she has not taken her medication today

## 2016-11-07 NOTE — H&P (Signed)
History and Physical    Becky [name] WNU:[id number] DOB: 1960-05-10 DOA: 11/07/2016  PCP: Redmond School, MD   Patient coming from: Home  Chief Complaint: Chest pain   HPI: Becky [name] is a 56 y.o. female with medical history significant for hypertension, GERD, and coronary artery disease, now presenting to the emergency department for evaluation of chest pain.  Patient reports that she been in her usual state of health until approximately 2 days ago when she noted the acute onset of right-sided, sharp, constant, nonradiating chest pain.  She reports that it is worse with deep inspiration and no alleviating factors have been identified.  Patient has not experienced the same symptoms previously.  Denies fevers or chills and denies any significant cough.  No lower extremity swelling or tenderness.  ED Course: Upon arrival to the ED, patient is found to be afebrile, saturating well on room air, tachycardic in the 130s, and with stable blood pressure.  EKG features atrial fibrillation with RVR, rate 136.  Chest x-ray is negative for acute cardiopulmonary disease, troponin is undetectable, TSH is normal, d-dimer is negative, and chemistry panel and CBC are normal patient was started on normal saline infusion, given a full dose of Lovenox, treated with morphine, and started on diltiazem infusion.  Heart rate has improved significantly, blood pressure remained stable, with and the patient will be admitted to the stepdown unit for ongoing evaluation and management of new onset atrial fibrillation with rapid ventricular response.  Review of Systems:  All other systems reviewed and apart from HPI, are negative.  Past Medical History:  Diagnosis Date  . Back pain   . Body aches 11/21/2014  . BV (bacterial vaginosis) 05/23/2013  . Constipation 11/21/2014  . Elevated cholesterol 11/02/2013  . Fibroids 03/13/2016  . Hematuria 05/23/2013  . Hyperlipidemia   . Hypertension   . Migraines   .  Pelvic pain in female 11/02/2013  . Plantar fasciitis of right foot   . Thyroid disease   . Vaginal discharge 03/24/2014  . Vaginal irritation 05/23/2013  . Vaginal itching 03/24/2014    Past Surgical History:  Procedure Laterality Date  . ECTOPIC PREGNANCY SURGERY    . ESOPHAGOGASTRODUODENOSCOPY  12/19/2009   IHK:[id number] stricture s/p dilation/mild gastritis  . ESOPHAGOGASTRODUODENOSCOPY N/A 12/10/2015   Procedure: ESOPHAGOGASTRODUODENOSCOPY (EGD);  Surgeon: Danie Binder, MD;  Location: [facility];  Service: Endoscopy;  Laterality: N/A;  830  . ileocolonoscopy  12/19/2009   GLO:[id number] polyps/mild left-side diverticulosis/hemorrhoids  . KNEE SURGERY    . TUBAL LIGATION       reports that she quit smoking about 18 years ago. Her smoking use included Cigarettes. She started smoking about 41 years ago. She has never used smokeless tobacco. She reports that she does not drink alcohol or use drugs.  Allergies  Allergen Reactions  . Hydralazine Nausea Only  . Prednisone Swelling    Family History  Problem Relation Age of Onset  . Heart failure Mother   . Hypertension Mother   . Diabetes Mother   . Heart failure Father   . Hypertension Father   . Heart failure Brother   . Hypertension Sister   . Other Sister        blocked artery in neck; knee replacement  . Other Brother        triple bypass surgery  . Hypertension Sister   . Diabetes Sister   . Colon cancer Neg Hx      Prior to Admission medications   Medication  Sig Start Date End Date Taking? Authorizing Provider  acetaminophen (TYLENOL) 500 MG tablet Take 500 mg by mouth every 6 (six) hours as needed for moderate pain or headache.    Yes [provider]  amLODipine (NORVASC) 10 MG tablet Take 10 mg by mouth daily. 09/21/14  Yes [provider]  ARMOUR THYROID 90 MG tablet Take 90 mg by mouth daily.  09/04/15  Yes [provider]  budesonide (RHINOCORT AQUA) 32 MCG/ACT nasal spray Place  1 spray into both nostrils daily as needed for rhinitis.   Yes [provider]  Cholecalciferol (VITAMIN D3) 5000 units CAPS Take 2 capsules by mouth daily as needed (for Vitamin D defficiency).   Yes [provider]  DEXILANT 60 MG capsule TAKE ONE (1) CAPSULE EACH DAY 09/16/16  Yes Carlis Stable, NP  DM-Phenylephrine-Acetaminophen (ALKA-SELTZER PLS SINUS & COUGH PO) Take 2 capsules by mouth daily as needed.   Yes [provider]  DULoxetine (CYMBALTA) 30 MG capsule Take 30 mg by mouth daily.  07/31/16  Yes [provider]  Glycerin-Polysorbate 80 (REFRESH DRY EYE THERAPY OP) Apply 1 drop to eye daily as needed (dry eyes).   Yes [provider]  ibuprofen (ADVIL,MOTRIN) 800 MG tablet Take 800 mg by mouth every 8 (eight) hours as needed for mild pain or moderate pain.   Yes [provider]  levocetirizine (XYZAL) 5 MG tablet Take 1 tablet by mouth daily. 09/07/16  Yes [provider]  linaclotide Rolan Lipa) 72 MCG capsule Take 1 capsule (72 mcg total) by mouth daily before breakfast. 10/03/16  Yes Mahala Menghini, PA-C  losartan (COZAAR) 100 MG tablet Take 100 mg by mouth daily. 09/21/14  Yes [provider]  montelukast (SINGULAIR) 10 MG tablet Take 1 tablet by mouth daily. 08/25/16  Yes [provider]  ondansetron (ZOFRAN) 4 MG tablet Take 4 mg by mouth every 8 (eight) hours as needed for nausea or vomiting.   Yes [provider]  ranitidine (ZANTAC) 150 MG capsule Take 1 capsule (150 mg total) by mouth every evening. 10/30/16  Yes Mahala Menghini, PA-C  spironolactone (ALDACTONE) 25 MG tablet Take 1 tablet (25 mg total) by mouth daily. 01/30/16 11/07/16 Yes Herminio Commons, MD  UNKNOWN TO PATIENT Place 1-2 drops into both eyes daily as needed. Patient states that it is an allergy eye drop that starts with a 'b' - unable to locate medication with similar name   Yes [provider]  clindamycin (CLEOCIN) 150  MG capsule Take 1 capsule by mouth 3 (three) times daily. 11/06/16   [provider]    Physical Exam: Vitals:   11/07/16 1830 11/07/16 1900 11/07/16 1945 11/07/16 2103  BP: 140/89 (!) 156/88 (!) 152/96 (!) 139/94  Pulse: 88 85 97 81  Resp: 18 17 19 18   Temp:      TempSrc:      SpO2: 97% 94% 94% 96%  Weight:      Height:          Constitutional: NAD, calm, in apparent discomfort Eyes: PERTLA, lids and conjunctivae normal ENMT: Mucous membranes are moist. Posterior pharynx clear of any exudate or lesions.   Neck: normal, supple, no masses, no thyromegaly Respiratory: clear to auscultation bilaterally, no wheezing, no crackles. Normal respiratory effort.    Cardiovascular: Rate ~110 and irregular.  No significant JVD. Abdomen: No distension, no tenderness, no masses palpated. Bowel sounds normal.  Musculoskeletal: no clubbing / cyanosis. No joint deformity  upper and lower extremities.   Skin: no significant rashes, lesions, ulcers. Warm, dry, well-perfused. Neurologic: CN 2-12 grossly intact. Sensation intact. Strength 5/5 in all 4 limbs.  Psychiatric:  Alert and oriented x 3. Calm. Cooperative.     Labs on Admission: I have personally reviewed following labs and imaging studies  CBC:  Recent Labs Lab 11/07/16 1735  WBC 9.7  HGB 13.8  HCT 41.3  MCV 92.8  PLT 119   Basic Metabolic Panel:  Recent Labs Lab 11/07/16 1735 11/07/16 1753  NA 140  --   K 4.1  --   CL 104  --   CO2 26  --   GLUCOSE 93  --   BUN 12  --   CREATININE 0.79  --   CALCIUM 10.0  --   MG  --  1.9   GFR: Estimated Creatinine Clearance: 100.6 mL/min (by C-G formula based on SCr of 0.79 mg/dL). Liver Function Tests: No results for input(s): AST, ALT, ALKPHOS, BILITOT, PROT, ALBUMIN in the last 168 hours. No results for input(s): LIPASE, AMYLASE in the last 168 hours. No results for input(s): AMMONIA in the last 168 hours. Coagulation Profile: No results for input(s): INR, PROTIME  in the last 168 hours. Cardiac Enzymes: No results for input(s): CKTOTAL, CKMB, CKMBINDEX, TROPONINI in the last 168 hours. BNP (last 3 results) No results for input(s): PROBNP in the last 8760 hours. HbA1C: No results for input(s): HGBA1C in the last 72 hours. CBG: No results for input(s): GLUCAP in the last 168 hours. Lipid Profile: No results for input(s): CHOL, HDL, LDLCALC, TRIG, CHOLHDL, LDLDIRECT in the last 72 hours. Thyroid Function Tests:  Recent Labs  11/07/16 1735  TSH 3.070   Anemia Panel: No results for input(s): VITAMINB12, FOLATE, FERRITIN, TIBC, IRON, RETICCTPCT in the last 72 hours. Urine analysis:    Component Value Date/Time   COLORURINE STRAW (A) 10/28/2016 1829   APPEARANCEUR CLEAR 10/28/2016 1829   APPEARANCEUR Clear 11/28/2015 1500   LABSPEC 1.008 10/28/2016 1829   PHURINE 6.0 10/28/2016 1829   GLUCOSEU NEGATIVE 10/28/2016 1829   HGBUR MODERATE (A) 10/28/2016 1829   BILIRUBINUR NEGATIVE 10/28/2016 1829   BILIRUBINUR Negative 11/28/2015 1500   KETONESUR NEGATIVE 10/28/2016 1829   PROTEINUR NEGATIVE 10/28/2016 1829   UROBILINOGEN 0.2 02/12/2015 1357   UROBILINOGEN 0.2 11/02/2013 1158   NITRITE NEGATIVE 10/28/2016 1829   LEUKOCYTESUR NEGATIVE 10/28/2016 1829   LEUKOCYTESUR Negative 11/28/2015 1500   Sepsis Labs: @LABRCNTIP (procalcitonin:4,lacticidven:4) )No results found for this or any previous visit (from the past 240 hour(s)).   Radiological Exams on Admission: Dg Chest 2 View  Result Date: 11/07/2016 CLINICAL DATA:  56 year old female with chest pain. EXAM: CHEST  2 VIEW COMPARISON:  Chest radiograph dated 10/27/2016 FINDINGS: The heart size and mediastinal contours are within normal limits. Both lungs are clear. The visualized skeletal structures are unremarkable. IMPRESSION: No active cardiopulmonary disease. Electronically Signed   By: Anner Crete M.D.   On: 11/07/2016 19:00   US Abdomen Limited Ruq/gall Gladder  Result Date:  11/07/2016 CLINICAL DATA:  56 year old female with acute right upper quadrant pain for 1 week. EXAM: ULTRASOUND ABDOMEN LIMITED RIGHT UPPER QUADRANT COMPARISON:  07/30/2006 ultrasound FINDINGS: Gallbladder: The gallbladder is unremarkable. There is no evidence of cholelithiasis or acute cholecystitis. Common bile duct: Diameter: 2.3 mm. No intrahepatic or extrahepatic biliary dilatation. Liver: Diffusely increased hepatic echogenicity noted. No focal abnormalities are present. Portal vein is patent on color Doppler imaging with normal direction of blood flow  towards the liver. IMPRESSION: 1. No evidence of acute abnormality 2. Hepatic steatosis Electronically Signed   By: Margarette Canada M.D.   On: 11/07/2016 10:51    EKG: Independently reviewed. Atrial fibrillation with RVR (rate 136)   Assessment/Plan  1. Atrial fibrillation with RVR, new-onset  - Pt presents with 2 days of right-sided CP and SOB, found to be in atrial fibrillation with rate in 130's  - TSH is wnl and initial troponin undetectable  - She was treated in ED with diltiazem infusion and treatment-dose Lovenox  - CHADS-VASc is 3 (gender, CAD, HTN)  - Plan to continue cardiac monitoring, continue rate-control with diltiazem, and anticoagulation with Lovenox   2. Chest pain, CAD   - Pt reports two days of right-sided chest pain, worse with deep breath and certain movements  - Initial troponin negative, d-dimer negative  - Possibly secondary to atrial fibrillation with RVR as she reports improvement with improved HR  - Continue cardiac monitoring, obtain serial troponin measurements, repeat EKG in am, continue ARB   3. Hypertension  - BP at goal  - Continue Norvasc, losartan, and Aldactone     DVT prophylaxis: Full-dose Lovenox Code Status: Full  Family Communication: Discussed with patient  Disposition Plan: Admit to SDU Consults called: None Admission status: Inpatient    Vianne Bulls, MD [facility] Pager  [phone]  If 7PM-7AM, please contact night-coverage [email or web address] Password [email or web address]  11/07/2016, 9:12 PM

## 2016-11-08 LAB — CBC
HCT: 38.8 % (ref 36.0–46.0)
Hemoglobin: 12.5 g/dL (ref 12.0–15.0)
MCH: 30.2 pg (ref 26.0–34.0)
MCHC: 32.2 g/dL (ref 30.0–36.0)
MCV: 93.7 fL (ref 78.0–100.0)
Platelets: 380 10*3/uL (ref 150–400)
RBC: 4.14 MIL/uL (ref 3.87–5.11)
RDW: 13 % (ref 11.5–15.5)
WBC: 9 10*3/uL (ref 4.0–10.5)

## 2016-11-08 LAB — BASIC METABOLIC PANEL
Anion gap: 8 (ref 5–15)
BUN: 11 mg/dL (ref 6–20)
CO2: 26 mmol/L (ref 22–32)
Calcium: 9.1 mg/dL (ref 8.9–10.3)
Chloride: 104 mmol/L (ref 101–111)
Creatinine, Ser: 0.86 mg/dL (ref 0.44–1.00)
GFR calc Af Amer: >60 mL/min (ref >60)
GFR calc non Af Amer: >60 mL/min (ref >60)
Glucose, Bld: 116 mg/dL — ABNORMAL HIGH (ref 65–99)
Potassium: 3.5 mmol/L (ref 3.5–5.1)
Sodium: 138 mmol/L (ref 135–145)

## 2016-11-08 LAB — TROPONIN I
Troponin I: <0.03 ng/mL (ref <0.03)
Troponin I: <0.03 ng/mL (ref <0.03)

## 2016-11-08 LAB — MRSA PCR SCREENING: MRSA by PCR: NEGATIVE

## 2016-11-08 MED ORDER — ZOLPIDEM TARTRATE 5 MG PO TABS
5.0000 mg | ORAL_TABLET | Freq: Every evening | ORAL | Status: DC | PRN
Start: 2016-11-08 — End: 2016-11-09
  Administered 2016-11-08: 5 mg via ORAL
  Filled 2016-11-08: qty 1

## 2016-11-08 MED ORDER — RIVAROXABAN (XARELTO) EDUCATION KIT FOR AFIB PATIENTS
PACK | Freq: Once | Status: DC
  Filled 2016-11-08: qty 1

## 2016-11-08 MED ORDER — RIVAROXABAN 20 MG PO TABS
20.0000 mg | ORAL_TABLET | Freq: Every day | ORAL | Status: DC
  Administered 2016-11-08: 20 mg via ORAL
  Filled 2016-11-08: qty 1

## 2016-11-08 MED ORDER — DILTIAZEM HCL 30 MG PO TABS: 30.0000 mg | ORAL_TABLET | Freq: Three times a day (TID) | ORAL | Status: DC

## 2016-11-08 MED ORDER — DILTIAZEM HCL 30 MG PO TABS
30.0000 mg | ORAL_TABLET | Freq: Three times a day (TID) | ORAL | Status: DC
  Administered 2016-11-08 – 2016-11-09 (×3): 30 mg via ORAL
  Filled 2016-11-08 (×3): qty 1

## 2016-11-08 MED ORDER — THYROID 30 MG PO TABS
90.0000 mg | ORAL_TABLET | Freq: Every day | ORAL | Status: DC
  Administered 2016-11-08 – 2016-11-09 (×2): 90 mg via ORAL
  Filled 2016-11-08 (×3): qty 3

## 2016-11-08 MED ORDER — RIVAROXABAN 20 MG PO TABS: 20.0000 mg | ORAL_TABLET | Freq: Every day | ORAL | Status: DC

## 2016-11-08 NOTE — Discharge Instructions (Addendum)
Atrial Fibrillation Atrial fibrillation is a type of heartbeat that is irregular or fast (rapid). If you have this condition, your heart keeps quivering in a weird (chaotic) way. This condition can make it so your heart cannot pump blood normally. Having this condition gives a person more risk for stroke, heart failure, and other heart problems. There are different types of atrial fibrillation. Talk with your doctor to learn about the type that you have. Follow these instructions at home:  Take over-the-counter and prescription medicines only as told by your doctor.  If your doctor prescribed a blood-thinning medicine, take it exactly as told. Taking too much of it can cause bleeding. If you do not take enough of it, you will not have the protection that you need against stroke and other problems.  Do not use any tobacco products. These include cigarettes, chewing tobacco, and e-cigarettes. If you need help quitting, ask your doctor.  If you have apnea (obstructive sleep apnea), manage it as told by your doctor.  Do not drink alcohol.  Do not drink beverages that have caffeine. These include coffee, soda, and tea.  Maintain a healthy weight. Do not use diet pills unless your doctor says they are safe for you. Diet pills may make heart problems worse.  Follow diet instructions as told by your doctor.  Exercise regularly as told by your doctor.  Keep all follow-up visits as told by your doctor. This is important. Contact a doctor if:  You notice a change in the speed, rhythm, or strength of your heartbeat.  You are taking a blood-thinning medicine and you notice more bruising.  You get tired more easily when you move or exercise. Get help right away if:  You have pain in your chest or your belly (abdomen).  You have sweating or weakness.  You feel sick to your stomach (nauseous).  You notice blood in your throw up (vomit), poop (stool), or pee (urine).  You are short of  breath.  You suddenly have swollen feet and ankles.  You feel dizzy.  Your suddenly get weak or numb in your face, arms, or legs, especially if it happens on one side of your body.  You have trouble talking, trouble understanding, or both.  Your face or your eyelid droops on one side. These symptoms may be an emergency. Do not wait to see if the symptoms will go away. Get medical help right away. Call your local emergency services (911 in the [place] Do not drive yourself to the hospital. This information is not intended to replace advice given to you by your health care provider. Make sure you discuss any questions you have with your health care provider. Document Released: 10/02/2007 Document Revised: 05/31/2015 Document Reviewed: 04/19/2014 Elsevier Interactive Patient Education  2018 [employer].  Atrial Fibrillation Atrial fibrillation is a type of irregular or rapid heartbeat (arrhythmia). In atrial fibrillation, the heart quivers continuously in a chaotic pattern. This occurs when parts of the heart receive disorganized signals that make the heart unable to pump blood normally. This can increase the risk for stroke, heart failure, and other heart-related conditions. There are different types of atrial fibrillation, including:  Paroxysmal atrial fibrillation. This type starts suddenly, and it usually stops on its own shortly after it starts.  Persistent atrial fibrillation. This type often lasts longer than a week. It may stop on its own or with treatment.  Long-lasting persistent atrial fibrillation. This type lasts longer than 12 months.  Permanent atrial fibrillation.  This type does not go away.  Talk with your health care provider to learn about the type of atrial fibrillation that you have. What are the causes? This condition is caused by some heart-related conditions or procedures, including:  A heart attack.  Coronary artery disease.  Heart failure.  Heart valve  conditions.  High blood pressure.  Inflammation of the sac that surrounds the heart (pericarditis).  Heart surgery.  Certain heart rhythm disorders, such as Wolf-Parkinson-White syndrome.  Other causes include:  Pneumonia.  Obstructive sleep apnea.  Blockage of an artery in the lungs (pulmonary embolism, or PE).  Lung cancer.  Chronic lung disease.  Thyroid problems, especially if the thyroid is overactive (hyperthyroidism).  Caffeine.  Excessive alcohol use or illegal drug use.  Use of some medicines, including certain decongestants and diet pills.  Sometimes, the cause cannot be found. What increases the risk? This condition is more likely to develop in:  People who are older in age.  People who smoke.  People who have diabetes mellitus.  People who are overweight (obese).  Athletes who exercise vigorously.  What are the signs or symptoms? Symptoms of this condition include:  A feeling that your heart is beating rapidly or irregularly.  A feeling of discomfort or pain in your chest.  Shortness of breath.  Sudden light-headedness or weakness.  Getting tired easily during exercise.  In some cases, there are no symptoms. How is this diagnosed? Your health care provider may be able to detect atrial fibrillation when taking your pulse. If detected, this condition may be diagnosed with:  An electrocardiogram (ECG).  A Holter monitor test that records your heartbeat patterns over a 24-hour period.  Transthoracic echocardiogram (TTE) to evaluate how blood flows through your heart.  Transesophageal echocardiogram (TEE) to view more detailed images of your heart.  A stress test.  Imaging tests, such as a CT scan or chest X-ray.  Blood tests.  How is this treated? The main goals of treatment are to prevent blood clots from forming and to keep your heart beating at a normal rate and rhythm. The type of treatment that you receive depends on many  factors, such as your underlying medical conditions and how you feel when you are experiencing atrial fibrillation. This condition may be treated with:  Medicine to slow down the heart rate, bring the hearts rhythm back to normal, or prevent clots from forming.  Electrical cardioversion. This is a procedure that resets your hearts rhythm by delivering a controlled, low-energy shock to the heart through your skin.  Different types of ablation, such as catheter ablation, catheter ablation with pacemaker, or surgical ablation. These procedures destroy the heart tissues that send abnormal signals. When the pacemaker is used, it is placed under your skin to help your heart beat in a regular rhythm.  Follow these instructions at home:  Take over-the counter and prescription medicines only as told by your health care provider.  If your health care provider prescribed a blood-thinning medicine (anticoagulant), take it exactly as told. Taking too much blood-thinning medicine can cause bleeding. If you do not take enough blood-thinning medicine, you will not have the protection that you need against stroke and other problems.  Do not use tobacco products, including cigarettes, chewing tobacco, and e-cigarettes. If you need help quitting, ask your health care provider.  If you have obstructive sleep apnea, manage your condition as told by your health care provider.  Do not drink alcohol.  Do not drink beverages  that contain caffeine, such as coffee, soda, and tea.  Maintain a healthy weight. Do not use diet pills unless your health care provider approves. Diet pills may make heart problems worse.  Follow diet instructions as told by your health care provider.  Exercise regularly as told by your health care provider.  Keep all follow-up visits as told by your health care provider. This is important. How is this prevented?  Avoid drinking beverages that contain caffeine or alcohol.  Avoid  certain medicines, especially medicines that are used for breathing problems.  Avoid certain herbs and herbal medicines, such as those that contain ephedra or ginseng.  Do not use illegal drugs, such as cocaine and amphetamines.  Do not smoke.  Manage your high blood pressure. Contact a health care provider if:  You notice a change in the rate, rhythm, or strength of your heartbeat.  You are taking an anticoagulant and you notice increased bruising.  You tire more easily when you exercise or exert yourself. Get help right away if:  You have chest pain, abdominal pain, sweating, or weakness.  You feel nauseous.  You notice blood in your vomit, bowel movement, or urine.  You have shortness of breath.  You suddenly have swollen feet and ankles.  You feel dizzy.  You have sudden weakness or numbness of the face, arm, or leg, especially on one side of the body.  You have trouble speaking, trouble understanding, or both (aphasia).  Your face or your eyelid droops on one side. These symptoms may represent a serious problem that is an emergency. Do not wait to see if the symptoms will go away. Get medical help right away. Call your local emergency services (911 in the [place] Do not drive yourself to the hospital. This information is not intended to replace advice given to you by your health care provider. Make sure you discuss any questions you have with your health care provider. Document Released: 12/23/2004 Document Revised: 05/02/2015 Document Reviewed: 04/19/2014 Elsevier Interactive Patient Education  2017 [employer]   Aspirin and Your Heart Aspirin is a medicine that affects the way blood clots. Aspirin can be used to help reduce the risk of blood clots, heart attacks, and other heart-related problems. Should I take aspirin? Your health care provider will help you determine whether it is safe and beneficial for you to take aspirin daily. Taking aspirin daily may be  beneficial if you:  Have had a heart attack or chest pain.  Have undergone open heart surgery such as coronary artery bypass surgery (CABG).  Have had coronary angioplasty.  Have experienced a stroke or transient ischemic attack (TIA).  Have peripheral vascular disease (PVD).  Have chronic heart rhythm problems such as atrial fibrillation.  Are there any risks of taking aspirin daily? Daily use of aspirin can increase your risk of side effects. Some of these include:  Bleeding. Bleeding problems can be minor or serious. An example of a minor problem is a cut that does not stop bleeding. An example of a more serious problem is stomach bleeding or bleeding into the brain. Your risk of bleeding is increased if you are also taking non-steroidal anti-inflammatory medicine (NSAIDs).  Increased bruising.  Upset stomach.  An allergic reaction. People who have nasal polyps have an increased risk of developing an aspirin allergy.  What are some guidelines I should follow when taking aspirin?  Take aspirin only as directed by your health care provider. Make sure you understand how much you should  take and what form you should take. The two forms of aspirin are: ? Non-enteric-coated. This type of aspirin does not have a coating and is absorbed quickly. Non-enteric-coated aspirin is usually recommended for people with chest pain. This type of aspirin also comes in a chewable form. ? Enteric-coated. This type of aspirin has a special coating that releases the medicine very slowly. Enteric-coated aspirin causes less stomach upset than non-enteric-coated aspirin. This type of aspirin should not be chewed or crushed.  Drink alcohol in moderation. Drinking alcohol increases your risk of bleeding. When should I seek medical care?  You have unusual bleeding or bruising.  You have stomach pain.  You have an allergic reaction. Symptoms of an allergic reaction include: ? Hives. ? Itchy  skin. ? Swelling of the lips, tongue, or face.  You have ringing in your ears. When should I seek immediate medical care?  Your bowel movements are bloody, dark red, or black in color.  You vomit or cough up blood.  You have blood in your urine.  You cough, wheeze, or feel short of breath. If you have any of the following symptoms, this is an emergency. Do not wait to see if the pain will go away. Get medical help at once. Call your local emergency services (911 in the [place] Do not drive yourself to the hospital.  You have severe chest pain, especially if the pain is crushing or pressure-like and spreads to the arms, back, neck, or jaw.  You have stroke-like symptoms, such as: ? Loss of vision. ? Difficulty talking. ? Numbness or weakness on one side of your body. ? Numbness or weakness in your arm or leg. ? Not thinking clearly or feeling confused.  This information is not intended to replace advice given to you by your health care provider. Make sure you discuss any questions you have with your health care provider. Document Released: 12/06/2007 Document Revised: 05/02/2015 Document Reviewed: 03/30/2013 Elsevier Interactive Patient Education  2018 [place] on my medicine - XARELTO (Rivaroxaban)  This medication education was reviewed with me or my healthcare representative as part of my discharge preparation.  The pharmacist that spoke with me during my hospital stay was:  Beverlee Nims, [facility]  Why was Xarelto prescribed for you? Xarelto was prescribed for you to reduce the risk of a blood clot forming that can cause a stroke if you have a medical condition called atrial fibrillation (a type of irregular heartbeat).  What do you need to know about xarelto ? Take your Xarelto ONCE DAILY at the same time every day with your evening meal. If you have difficulty swallowing the tablet whole, you may crush it and mix in applesauce just prior to taking your  dose.  Take Xarelto exactly as prescribed by your doctor and DO NOT stop taking Xarelto without talking to the doctor who prescribed the medication.  Stopping without other stroke prevention medication to take the place of Xarelto may increase your risk of developing a clot that causes a stroke.  Refill your prescription before you run out.  After discharge, you should have regular check-up appointments with your healthcare provider that is prescribing your Xarelto.  In the future your dose may need to be changed if your kidney function or weight changes by a significant amount.  What do you do if you miss a dose? If you are taking Xarelto ONCE DAILY and you miss a dose, take it as soon as you remember on the  same day then continue your regularly scheduled once daily regimen the next day. Do not take two doses of Xarelto at the same time or on the same day.   Important Safety Information A possible side effect of Xarelto is bleeding. You should call your healthcare provider right away if you experience any of the following: ? Bleeding from an injury or your nose that does not stop. ? Unusual colored urine (red or dark brown) or unusual colored stools (red or black). ? Unusual bruising for unknown reasons. ? A serious fall or if you hit your head (even if there is no bleeding).  Some medicines may interact with Xarelto and might increase your risk of bleeding while on Xarelto. To help avoid this, consult your healthcare provider or pharmacist prior to using any new prescription or non-prescription medications, including herbals, vitamins, non-steroidal anti-inflammatory drugs (NSAIDs) and supplements.  This website has more information on Xarelto: [email or web address]

## 2016-11-08 NOTE — Plan of Care (Signed)
Problem: Pain Managment: Goal: General experience of comfort will improve Outcome: Progressing Patient given PRN pain medication with relief.

## 2016-11-08 NOTE — Plan of Care (Signed)
Problem: Safety: Goal: Ability to remain free from injury will improve Outcome: Progressing Patient assisted to toilet in room while wearing yellow socks.  Patient's bed in lowest position and bed alarm on.

## 2016-11-08 NOTE — Progress Notes (Signed)
[facility] for xarelto Indication: atrial fibrillation  Allergies  Allergen Reactions  . Hydralazine Nausea Only  . Prednisone Swelling    Patient Measurements: Height: 5\' 7"  (170.2 cm) Weight: 240 lb 15.4 oz (109.3 kg) IBW/kg (Calculated) : 61.6 Heparin Dosing Weight: 108 kg  Vital Signs: Temp: 97.9 F (36.6 C) (11/03 0800) Temp Source: Oral (11/03 0800) BP: 122/70 (11/03 1035) Pulse Rate: 67 (11/03 1020)  Labs:  Recent Labs  11/07/16 1735 11/08/16 0401 11/08/16 0402 11/08/16 0905  HGB 13.8  --  12.5  --   HCT 41.3  --  38.8  --   PLT 398  --  380  --   CREATININE 0.79  --  0.86  --   TROPONINI <0.03 <0.03  --  <0.03    Estimated Creatinine Clearance: 94.2 mL/min (by C-G formula based on SCr of 0.86 mg/dL).   Medical History: Past Medical History:  Diagnosis Date  . Back pain   . Body aches 11/21/2014  . BV (bacterial vaginosis) 05/23/2013  . Constipation 11/21/2014  . Elevated cholesterol 11/02/2013  . Fibroids 03/13/2016  . Hematuria 05/23/2013  . Hyperlipidemia   . Hypertension   . Migraines   . Pelvic pain in female 11/02/2013  . Plantar fasciitis of right foot   . Thyroid disease   . Vaginal discharge 03/24/2014  . Vaginal irritation 05/23/2013  . Vaginal itching 03/24/2014    Medications:  See medication history  Assessment: 56 yo lady who started lovenox for CP, afib.  Now will change to xarelto.  Last dose lovenox ~ 10:00 this am Goal of Therapy:  Therapeutic anticoagulation Monitor platelets by anticoagulation protocol: Yes   Plan:  Xarelto 20 mg po daily start tonight. F/u CBC.  Monitor for bleeding complications F/u xarelto education  Yulia Ulrich, [place] 11/08/2016,11:05 AM

## 2016-11-08 NOTE — Progress Notes (Signed)
PROGRESS NOTE    Becky [name]  FOY:[id number] DOB: 12-02-1960 DOA: 11/07/2016 PCP: Redmond School, MD     Brief Narrative:  56 year old woman admitted from home on 11 2 due to chest pain.  She was found to be in A. fib with RVR with a rate in the 130s-140s and admission was requested.   Assessment & Plan:   Principal Problem:   Atrial fibrillation with RVR (HCC) Active Problems:   Essential hypertension   Chest pain   Coronary artery disease due to lipid rich plaque   Atrial fibrillation with rapid ventricular response -She has now converted to sinus rhythm. -We will start oral Cardizem and transition off Cardizem drip today. -Echo has been requested. -Meets criteria for anticoagulation, has been started on Xarelto.  Chest pain -Likely due to A. fib with rapid rates.   -Has ruled out for ACS with negative troponins and EKG without acute ischemic changes. -Echo requested, if normal do not anticipate further cardiac workup.  Hypertension -Well-controlled, continue losartan, Aldactone, will discontinue Norvasc and start Cardizem.   DVT prophylaxis: Xarelto Code Status: Full code Family Communication: Patient only Disposition Plan: Anticipate discharge home in 24 hours  Consultants:   None  Procedures:   None  Antimicrobials:  Anti-infectives    None       Subjective: Feels improved, denies chest pain or shortness of breath as of this morning.  Objective: Vitals:   11/08/16 1730 11/08/16 1745 11/08/16 1800 11/08/16 1815  BP: 124/66 136/72 (!) 114/59 (!) 110/52  Pulse: 69 78 72 68  Resp: 17 (!) 26 15 17   Temp:      TempSrc:      SpO2: 98% 98% 96% 97%  Weight:      Height:        Intake/Output Summary (Last 24 hours) at 11/08/16 1824 Last data filed at 11/08/16 0600  Gross per 24 hour  Intake          1265.05 ml  Output                0 ml  Net          1265.05 ml   Filed Weights   11/07/16 1716 11/07/16 2239 11/08/16 0626  Weight: 108  kg (238 lb) 103.8 kg (228 lb 13.4 oz) 109.3 kg (240 lb 15.4 oz)    Examination:  General exam: Alert, awake, oriented x 3 Respiratory system: Clear to auscultation. Respiratory effort normal. Cardiovascular system:RRR. No murmurs, rubs, gallops. Gastrointestinal system: Abdomen is nondistended, soft and nontender. No organomegaly or masses felt. Normal bowel sounds heard. Central nervous system: Alert and oriented. No focal neurological deficits. Extremities: No C/C/E, +pedal pulses Skin: No rashes, lesions or ulcers Psychiatry: Judgement and insight appear normal. Mood & affect appropriate.     Data Reviewed: I have personally reviewed following labs and imaging studies  CBC:  Recent Labs Lab 11/07/16 1735 11/08/16 0402  WBC 9.7 9.0  HGB 13.8 12.5  HCT 41.3 38.8  MCV 92.8 93.7  PLT 398 767   Basic Metabolic Panel:  Recent Labs Lab 11/07/16 1735 11/07/16 1753 11/08/16 0402  NA 140  --  138  K 4.1  --  3.5  CL 104  --  104  CO2 26  --  26  GLUCOSE 93  --  116*  BUN 12  --  11  CREATININE 0.79  --  0.86  CALCIUM 10.0  --  9.1  MG  --  1.9  --  GFR: Estimated Creatinine Clearance: 94.2 mL/min (by C-G formula based on SCr of 0.86 mg/dL). Liver Function Tests: No results for input(s): AST, ALT, ALKPHOS, BILITOT, PROT, ALBUMIN in the last 168 hours. No results for input(s): LIPASE, AMYLASE in the last 168 hours. No results for input(s): AMMONIA in the last 168 hours. Coagulation Profile: No results for input(s): INR, PROTIME in the last 168 hours. Cardiac Enzymes:  Recent Labs Lab 11/07/16 1735 11/08/16 0401 11/08/16 0905  TROPONINI <0.03 <0.03 <0.03   BNP (last 3 results) No results for input(s): PROBNP in the last 8760 hours. HbA1C: No results for input(s): HGBA1C in the last 72 hours. CBG: No results for input(s): GLUCAP in the last 168 hours. Lipid Profile: No results for input(s): CHOL, HDL, LDLCALC, TRIG, CHOLHDL, LDLDIRECT in the last 72  hours. Thyroid Function Tests:  Recent Labs  11/07/16 1735  TSH 3.070  FREET4 0.67   Anemia Panel: No results for input(s): VITAMINB12, FOLATE, FERRITIN, TIBC, IRON, RETICCTPCT in the last 72 hours. Urine analysis:    Component Value Date/Time   COLORURINE STRAW (A) 10/28/2016 1829   APPEARANCEUR CLEAR 10/28/2016 1829   APPEARANCEUR Clear 11/28/2015 1500   LABSPEC 1.008 10/28/2016 1829   PHURINE 6.0 10/28/2016 1829   GLUCOSEU NEGATIVE 10/28/2016 1829   HGBUR MODERATE (A) 10/28/2016 1829   BILIRUBINUR NEGATIVE 10/28/2016 1829   BILIRUBINUR Negative 11/28/2015 1500   KETONESUR NEGATIVE 10/28/2016 1829   PROTEINUR NEGATIVE 10/28/2016 1829   UROBILINOGEN 0.2 02/12/2015 1357   UROBILINOGEN 0.2 11/02/2013 1158   NITRITE NEGATIVE 10/28/2016 1829   LEUKOCYTESUR NEGATIVE 10/28/2016 1829   LEUKOCYTESUR Negative 11/28/2015 1500   Sepsis Labs: @LABRCNTIP (procalcitonin:4,lacticidven:4)  ) Recent Results (from the past 240 hour(s))  MRSA PCR Screening     Status: None   Collection Time: 11/07/16 10:36 PM  Result Value Ref Range Status   MRSA by PCR NEGATIVE NEGATIVE Final    Comment:        The GeneXpert MRSA Assay (FDA approved for NASAL specimens only), is one component of a comprehensive MRSA colonization surveillance program. It is not intended to diagnose MRSA infection nor to guide or monitor treatment for MRSA infections.          Radiology Studies: Dg Chest 2 View  Result Date: 11/07/2016 CLINICAL DATA:  56 year old female with chest pain. EXAM: CHEST  2 VIEW COMPARISON:  Chest radiograph dated 10/27/2016 FINDINGS: The heart size and mediastinal contours are within normal limits. Both lungs are clear. The visualized skeletal structures are unremarkable. IMPRESSION: No active cardiopulmonary disease. Electronically Signed   By: Anner Crete M.D.   On: 11/07/2016 19:00   US Abdomen Limited Ruq/gall Gladder  Result Date: 11/07/2016 CLINICAL DATA:   56 year old female with acute right upper quadrant pain for 1 week. EXAM: ULTRASOUND ABDOMEN LIMITED RIGHT UPPER QUADRANT COMPARISON:  07/30/2006 ultrasound FINDINGS: Gallbladder: The gallbladder is unremarkable. There is no evidence of cholelithiasis or acute cholecystitis. Common bile duct: Diameter: 2.3 mm. No intrahepatic or extrahepatic biliary dilatation. Liver: Diffusely increased hepatic echogenicity noted. No focal abnormalities are present. Portal vein is patent on color Doppler imaging with normal direction of blood flow towards the liver. IMPRESSION: 1. No evidence of acute abnormality 2. Hepatic steatosis Electronically Signed   By: Margarette Canada M.D.   On: 11/07/2016 10:51        Scheduled Meds: . amLODipine  10 mg Oral Daily  . diltiazem  30 mg Oral Q8H  . DULoxetine  30 mg Oral Daily  .  famotidine  20 mg Oral Daily  . Influenza vac split quadrivalent PF  0.5 mL Intramuscular Tomorrow-1000  . linaclotide  72 mcg Oral QAC breakfast  . loratadine  10 mg Oral Daily  . losartan  100 mg Oral Daily  . montelukast  10 mg Oral Daily  . pantoprazole  40 mg Oral Daily  . rivaroxaban   Does not apply Once  . rivaroxaban  20 mg Oral Q supper  . spironolactone  25 mg Oral Daily  . thyroid  90 mg Oral QAC breakfast   Continuous Infusions: . diltiazem (CARDIZEM) infusion Stopped (11/08/16 1313)     LOS: 1 day    Time spent: 35 minutes. Greater than 50% of this time was spent in direct contact with the patient coordinating care.     Lelon Frohlich, MD [facility] Pager [phone]  If 7PM-7AM, please contact night-coverage [email or web address] Password [facility] 11/08/2016, 6:24 PM

## 2016-11-08 NOTE — Progress Notes (Signed)
Initial Nutrition Assessment  DOCUMENTATION CODES:  Obesity unspecified  INTERVENTION:  Patient would appear to be eating well. Gradual weight loss beneficial. D/C ensure. Monitor intakes. F/U as warranted.   NUTRITION DIAGNOSIS:  Inadequate oral intake related to decreased appetite as evidenced by per patient/family report.  GOAL:  Patient will meet greater than or equal to 90% of their needs  MONITOR:  PO intake, Weight trends, Labs, I & O's  REASON FOR ASSESSMENT:  Malnutrition Screening Tool    ASSESSMENT:  56 y/o female PMHx HTN/HLD, GERD, CAD. Presented to the ED due to chest pain. Evaluation in ED revealed new AFib. Admitted for management.   RD is operating remotely., Patient had reported on MST that she had lost 10 lbs and has had been eating poorly due to reduced appetite.   Per review of chart, she has had multiple recent ED visits due to nausea/shoulder pain as well as abdominal Pain. These may have be related to pt's purported wt and appetite loss.   Her weight history appears to be mainly comprised of reported weights ranging 248-252 lbs. Her weight of 229 lbs yesterday was a bed weight and would reflect a loss of  10 lbs in ~1 month and 20 lbs since the Spring. Neither of these measurements are clinically significant. Gradual weight loss would be beneficial for her health.   At this time, patient appears to have an appetite. She is documented as eating 100% of a salad brought in. Do not feel the patient requires supplementation at this time. Will monitor PO intakes and reassess as needed.   Physical Exam: Unable to conduct  Labs: Reviewed. Largely WDL Meds: Ensure, Linzess, PPI, Spironolactone, IVF   Recent Labs Lab 11/07/16 1735 11/07/16 1753 11/08/16 0402  NA 140  --  138  K 4.1  --  3.5  CL 104  --  104  CO2 26  --  26  BUN 12  --  11  CREATININE 0.79  --  0.86  CALCIUM 10.0  --  9.1  MG  --  1.9  --   GLUCOSE 93  --  116*   NUTRITION - FOCUSED  PHYSICAL EXAM: Unable to conduct  Diet Order:  Diet Heart Room service appropriate? Yes; Fluid consistency: Thin  EDUCATION NEEDS:  No education needs have been identified at this time  Skin:  Skin Assessment: Reviewed RN Assessment  Last BM:  11/2  Height:  Ht Readings from Last 1 Encounters:  11/07/16 5\' 7"  (1.702 m)   Weight:  Wt Readings from Last 1 Encounters:  11/08/16 240 lb 15.4 oz (109.3 kg)   Wt Readings from Last 10 Encounters:  11/08/16 240 lb 15.4 oz (109.3 kg)  10/28/16 238 lb (108 kg)  10/27/16 238 lb (108 kg)  10/03/16 239 lb 6.4 oz (108.6 kg)  04/29/16 245 lb (111.1 kg)  04/07/16 248 lb (112.5 kg)  03/31/16 248 lb (112.5 kg)  03/13/16 250 lb (113.4 kg)  01/30/16 250 lb (113.4 kg)  11/28/15 251 lb 8 oz (114.1 kg)   Ideal Body Weight:  61.36 kg  BMI:  Body mass index is 37.74 kg/m.  Dosing weight is 229 lbs (104.1 kg) Estimated Nutritional Needs:  Kcal:  1750-1900 (17-18 kcal/kg bw) Protein:  67-80g Pro (1.1-1.3 g/kg ibw) Fluid:  1.7-1.9 L fluid (1 ml/kcal)   Burtis Junes RD, LDN, CNSC Clinical Nutrition Pager: [phone] 11/08/2016 9:19 AM

## 2016-11-08 NOTE — Progress Notes (Signed)
Dr. Jerilee Hoh notified of 1st degree AVB. HR 61 B/P 99/60. Patient denies any discomfort.

## 2016-11-09 LAB — HIV ANTIBODY (ROUTINE TESTING W REFLEX): HIV Screen 4th Generation wRfx: NONREACTIVE

## 2016-11-09 LAB — BASIC METABOLIC PANEL
Anion gap: 7 (ref 5–15)
BUN: 14 mg/dL (ref 6–20)
CO2: 26 mmol/L (ref 22–32)
Calcium: 9 mg/dL (ref 8.9–10.3)
Chloride: 105 mmol/L (ref 101–111)
Creatinine, Ser: 0.83 mg/dL (ref 0.44–1.00)
GFR calc Af Amer: >60 mL/min (ref >60)
GFR calc non Af Amer: >60 mL/min (ref >60)
Glucose, Bld: 107 mg/dL — ABNORMAL HIGH (ref 65–99)
Potassium: 4.1 mmol/L (ref 3.5–5.1)
Sodium: 138 mmol/L (ref 135–145)

## 2016-11-09 LAB — CBC
HCT: 35.6 % — ABNORMAL LOW (ref 36.0–46.0)
Hemoglobin: 11.7 g/dL — ABNORMAL LOW (ref 12.0–15.0)
MCH: 30.8 pg (ref 26.0–34.0)
MCHC: 32.9 g/dL (ref 30.0–36.0)
MCV: 93.7 fL (ref 78.0–100.0)
Platelets: 322 10*3/uL (ref 150–400)
RBC: 3.8 MIL/uL — ABNORMAL LOW (ref 3.87–5.11)
RDW: 12.8 % (ref 11.5–15.5)
WBC: 6.8 10*3/uL (ref 4.0–10.5)

## 2016-11-09 MED ORDER — DILTIAZEM HCL 30 MG PO TABS: 30.0000 mg | ORAL_TABLET | Freq: Three times a day (TID) | ORAL | 3 refills | Status: DC

## 2016-11-09 MED ORDER — RIVAROXABAN 20 MG PO TABS
20.0000 mg | ORAL_TABLET | Freq: Every day | ORAL | 2 refills | Status: DC
Start: 2016-11-09 — End: 2016-11-12

## 2016-11-09 NOTE — Progress Notes (Signed)
Transported to the front entrance in a wheelchair with all belongings given to daughter or held by patient.  Assisted into car into the care of daughter.

## 2016-11-09 NOTE — Progress Notes (Signed)
Discharge instructions reviewed including appointments, medications given and to be taken, and signs and symptoms which need to be reported to a physician or require a visit to an emergency room.  All questions answered and patient indicated understanding.

## 2016-11-11 NOTE — Progress Notes (Signed)
Cardiology Office Note    Date:  11/12/2016   ID:  Becky [name], DOB 1960/01/16, MRN [id number]  PCP:  Redmond School, MD  Cardiologist: Dr. Bronson Ing   Chief Complaint  Patient presents with  . Hospitalization Follow-up    History of Present Illness:    Becky [name] is a 56 y.o. female with past medical history of HTN, HLD, and chronic lower extremity edema who presents to the office today for hospital follow-up.   She was last examined by Dr. Bronson Ing in 04/2016 and reported recent episodes of chest pain when pushing herself up out of a chair or turning from side-to-side. With her recent NST in 05/2015 showing only small mild apical infarct with mild peri-infarct ischemia and being a low-risk study, continued medical management was pursued.   She presented to [facility] ED on 11/07/2016 for evaluation of right-sided chest pain. Her initial EKG showed that she was in atrial fibrillation with RVR and she was therefore admitted and started on IV Cardizem along with Lovenox for anticoagulation. Cyclic troponin values remained negative. The following day, she was found to have converted to NSR and was switched to PO Cardizem along with Xarelto 20mg  daily for anticoagulation.   In talking with the patient today, she reports doing well since her recent hospitalization. She denies any repeat episodes of chest discomfort. Reports she never experienced palpitations or dyspnea with her recent episode of atrial fibrillation. She has been monitoring her heart rate at home and reports this has remained well-controlled in the 60's - 70's.   She reports good compliance with her Xarelto and denies missing any recent doses. No melena, hematochezia, or hematuria.   Past Medical History:  Diagnosis Date  . Back pain   . Body aches 11/21/2014  . BV (bacterial vaginosis) 05/23/2013  . Constipation 11/21/2014  . Elevated cholesterol 11/02/2013  . Fibroids 03/13/2016  . Hematuria 05/23/2013    . Hyperlipidemia   . Hypertension   . Migraines   . PAF (paroxysmal atrial fibrillation) ([place])    a. diagnosed in 11/2016 --> started on Xarelto for anticoagulation  . Pelvic pain in female 11/02/2013  . Plantar fasciitis of right foot   . Thyroid disease   . Vaginal discharge 03/24/2014  . Vaginal irritation 05/23/2013  . Vaginal itching 03/24/2014    Past Surgical History:  Procedure Laterality Date  . ECTOPIC PREGNANCY SURGERY    . ESOPHAGOGASTRODUODENOSCOPY  12/19/2009   HQR:FXJOIT stricture s/p dilation/mild gastritis  . ileocolonoscopy  12/19/2009   GPQ:DIYMEBRAXENM polyps/mild left-side diverticulosis/hemorrhoids  . KNEE SURGERY    . TUBAL LIGATION      Current Medications: Outpatient Medications Prior to Visit  Medication Sig Dispense Refill  . acetaminophen (TYLENOL) 500 MG tablet Take 500 mg by mouth every 6 (six) hours as needed for moderate pain or headache.     [name] THYROID 90 MG tablet Take 90 mg by mouth daily.   4  . budesonide (RHINOCORT AQUA) 32 MCG/ACT nasal spray Place 1 spray into both nostrils daily as needed for rhinitis.    . Cholecalciferol (VITAMIN D3) 5000 units CAPS Take 2 capsules by mouth daily as needed (for Vitamin D defficiency).    . DEXILANT 60 MG capsule TAKE ONE (1) CAPSULE EACH DAY 30 capsule 5  . DULoxetine (CYMBALTA) 30 MG capsule Take 30 mg by mouth daily.     . Glycerin-Polysorbate 80 (REFRESH DRY EYE THERAPY OP) Apply 1 drop to eye daily as needed (dry eyes).    Marland Kitchen  linaclotide (LINZESS) 72 MCG capsule Take 1 capsule (72 mcg total) by mouth daily before breakfast. 30 capsule 11  . losartan (COZAAR) 100 MG tablet Take 100 mg by mouth daily.    . montelukast (SINGULAIR) 10 MG tablet Take 1 tablet by mouth daily.    . ondansetron (ZOFRAN) 4 MG tablet Take 4 mg by mouth every 8 (eight) hours as needed for nausea or vomiting.    . ranitidine (ZANTAC) 150 MG capsule Take 1 capsule (150 mg total) by mouth every evening. 30 capsule 5  .  spironolactone (ALDACTONE) 25 MG tablet Take 1 tablet (25 mg total) by mouth daily. 90 tablet 3  . diltiazem (CARDIZEM) 30 MG tablet Take 1 tablet (30 mg total) every 8 (eight) hours by mouth. 90 tablet 3  . ibuprofen (ADVIL,MOTRIN) 800 MG tablet Take 800 mg by mouth every 8 (eight) hours as needed for mild pain or moderate pain.    . rivaroxaban (XARELTO) 20 MG TABS tablet Take 1 tablet (20 mg total) daily with supper by mouth. 30 tablet 2   No facility-administered medications prior to visit.      Allergies:   Hydralazine and Prednisone   Social History   Socioeconomic History  . Marital status: Married    Spouse name: None  . Number of children: None  . Years of education: None  . Highest education level: None  Social Needs  . Financial resource strain: None  . Food insecurity - worry: None  . Food insecurity - inability: None  . Transportation needs - medical: None  . Transportation needs - non-medical: None  Occupational History  . None  Tobacco Use  . Smoking status: Former Smoker    Types: Cigarettes    Start date: 01/07/1975    Last attempt to quit: 01/06/1998    Years since quitting: 18.8  . Smokeless tobacco: Never Used  Substance and Sexual Activity  . Alcohol use: No    Alcohol/week: 0.0 oz  . Drug use: No  . Sexual activity: Yes    Birth control/protection: Post-menopausal, Surgical    Comment: tubal  Other Topics Concern  . None  Social History Narrative  . None     Family History:  The patient's family history includes Diabetes in her mother and sister; Heart failure in her brother, father, and mother; Hypertension in her father, mother, sister, and sister; Other in her brother and sister.   Review of Systems:   Please see the history of present illness.     General:  No chills, fever, night sweats or weight changes.  Cardiovascular:  No chest pain, dyspnea on exertion, edema, orthopnea, palpitations, paroxysmal nocturnal dyspnea. Dermatological: No  rash, lesions/masses Respiratory: No cough, dyspnea Urologic: No hematuria, dysuria Abdominal:   No vomiting, diarrhea, bright red blood per rectum, melena, or hematemesis. Positive for nausea.  Neurologic:  No visual changes, wkns, changes in mental status.  All other systems reviewed and are otherwise negative except as noted above.   Physical Exam:    VS:  BP 134/72   Pulse 64   Ht 5\' 7"  (1.702 m)   Wt 245 lb (111.1 kg)   SpO2 97%   BMI 38.37 kg/m    General: Well developed, well nourished [place] female appearing in no acute distress. Head: Normocephalic, atraumatic, sclera non-icteric, no xanthomas, nares are without discharge.  Neck: No carotid bruits. JVD not elevated.  Lungs: Respirations regular and unlabored, without wheezes or rales.  Heart: Regular rate and  rhythm with occasional ectopic beats. No S3 or S4.  No murmur, no rubs, or gallops appreciated. Abdomen: Soft, non-tender, non-distended with normoactive bowel sounds. No hepatomegaly. No rebound/guarding. No obvious abdominal masses. Msk:  Strength and tone appear normal for age. No joint deformities or effusions. Extremities: No clubbing or cyanosis. No lower extremity edema.  Distal pedal pulses are 2+ bilaterally. Neuro: Alert and oriented X 3. Moves all extremities spontaneously. No focal deficits noted. Psych:  Responds to questions appropriately with a normal affect. Skin: No rashes or lesions noted  Wt Readings from Last 3 Encounters:  11/12/16 245 lb (111.1 kg)  11/09/16 238 lb 8.6 oz (108.2 kg)  10/28/16 238 lb (108 kg)     Studies/Labs Reviewed:   EKG:  EKG is ordered today.  The ekg ordered today demonstrates NSR, HR 72, with PAC's.   Recent Labs: 10/28/2016: ALT 24 11/07/2016: Magnesium 1.9; TSH 3.070 11/09/2016: BUN 14; Creatinine, Ser 0.83; Hemoglobin 11.7; Platelets 322; Potassium 4.1; Sodium 138   Lipid Panel    Component Value Date/Time   CHOL 268 (H) 03/28/2015 1548   TRIG 91  03/28/2015 1548   HDL 68 03/28/2015 1548   CHOLHDL 3.9 03/28/2015 1548   CHOLHDL 4.1 11/02/2013 1157   VLDL 17 11/02/2013 1157   LDLCALC 182 (H) 03/28/2015 1548    Additional studies/ records that were reviewed today include:   Echocardiogram: 05/2015 Study Conclusions  - Left ventricle: The cavity size was normal. Wall thickness was   increased increased in a pattern of mild to moderate LVH.   Systolic function was normal. The estimated ejection fraction was   in the range of 55% to 60%. Diastolic function is abnormal,   indeterminate grade. Wall motion was normal; there were no   regional wall motion abnormalities. - Aortic valve: Mildly calcified annulus. Trileaflet; normal   thickness leaflets. Valve area (VTI): 2.92 cm^2. Valve area   (Vmax): 2.95 cm^2. - Left atrium: The atrium was mildly dilated. - Atrial septum: No defect or patent foramen ovale was identified. - Technically adequate study.   NST: 05/2015  There was no ST segment deviation noted during stress.  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  This is a low risk study. Overall small amount of myocardium at jeopardy.  The left ventricular ejection fraction is normal (55-65%).  Assessment:    1. Paroxysmal atrial fibrillation (HCC)   2. Current use of long term anticoagulation   3. Chest pain, unspecified type   4. Essential hypertension   5. Pure hypercholesterolemia      Plan:   In order of problems listed above:  1. Paroxysmal Atrial Fibrillation/ Use of Long-term Anticoagulation - the patient was recently admitted for atrial fibrillation with RVR and converted to NSR with IV Cardizem. She denies any repeat episodes of chest pain or palpitations since. TSH, electrolytes, and troponin values were within normal limits.  - she was discharged on short-acting Cardizem 30mg  TID. Will convert this to Cardizem CD 120mg  daily for improved compliance.   - she  denies any evidence of active bleeding. Continue on Xarelto 20mg  daily.  - plan for a repeat echocardiogram to assess LV function and wall motion in the setting of her newly diagnosed arrhythmia.   2. Chest Pain - NST was performed in 05/2015 and showed a small mild apical infarct with mild peri-infarct ischemia. Overall, the study was low-risk.  - she denies any recurrent chest pain or dyspnea on exertion.  -  continue with risk factor modification including repeat FLP/LFT's as she is no longer on statin therapy. No ASA secondary to the need for anticoagulation.   3. HTN - BP is well-controlled at 134/72 during today's visit.  - continue current medication regimen.   4. HLD - this is followed by her PCP. She was previously on Atorvastatin but developed myalgias and quit taking the medication. Recommended repeat FLP and LFT's. Consider trying Crestor or Pravastatin pending repeat labs.   Medication Adjustments/Labs and Tests Ordered: Current medicines are reviewed at length with the patient today.  Concerns regarding medicines are outlined above.  Medication changes, Labs and Tests ordered today are listed in the Patient Instructions below. Patient Instructions  Medication Instructions:  Your physician has recommended you make the following change in your medication:  Start Cardizem CD 120 mg Daily  Stop Cardizem 30 mg Three Times Daily    Labwork: NONE   Testing/Procedures: Your physician has requested that you have an echocardiogram. Echocardiography is a painless test that uses sound waves to create images of your heart. It provides your doctor with information about the size and shape of your heart and how well your heart's chambers and valves are working. This procedure takes approximately one hour. There are no restrictions for this procedure.  Follow-Up: Your physician recommends that you schedule a follow-up appointment in: 2-3 Months    Any Other Special Instructions Will Be  Listed Below (If Applicable). Thank you for choosing [facility]!   If you need a refill on your cardiac medications before your next appointment, please call your pharmacy.    Signed, Erma Heritage, PA-C  11/12/2016 7:16 PM    [facility] [place], [place], [place]  [address] Phone: [phone]; Fax: [phone]  [address] [place], [place] [address] Phone: [phone]

## 2016-11-12 ENCOUNTER — Ambulatory Visit: Payer: BLUE CROSS/BLUE SHIELD | Admitting: Student

## 2016-11-12 ENCOUNTER — Encounter: Payer: Self-pay | Admitting: Student

## 2016-11-12 VITALS — BP 134/72 | HR 64 | Ht 67.0 in | Wt 245.0 lb

## 2016-11-12 DIAGNOSIS — Z7901 Long term (current) use of anticoagulants: Secondary | ICD-10-CM | POA: Insufficient documentation

## 2016-11-12 DIAGNOSIS — I10 Essential (primary) hypertension: Secondary | ICD-10-CM

## 2016-11-12 DIAGNOSIS — E7800 Pure hypercholesterolemia, unspecified: Secondary | ICD-10-CM | POA: Diagnosis not present

## 2016-11-12 DIAGNOSIS — I480 Paroxysmal atrial fibrillation: Secondary | ICD-10-CM | POA: Diagnosis not present

## 2016-11-12 DIAGNOSIS — R079 Chest pain, unspecified: Secondary | ICD-10-CM

## 2016-11-12 MED ORDER — DILTIAZEM HCL ER COATED BEADS 120 MG PO CP24: 120.0000 mg | ORAL_CAPSULE | Freq: Every day | ORAL | 3 refills | Status: DC

## 2016-11-12 MED ORDER — RIVAROXABAN 20 MG PO TABS: 20.0000 mg | ORAL_TABLET | Freq: Every day | ORAL | 3 refills | Status: DC

## 2016-11-12 NOTE — Patient Instructions (Signed)
Medication Instructions:  Your physician has recommended you make the following change in your medication:  Start Cardizem CD 120 mg Daily  Stop Cardizem 30 mg Three Times Daily    Labwork: NONE   Testing/Procedures: Your physician has requested that you have an echocardiogram. Echocardiography is a painless test that uses sound waves to create images of your heart. It provides your doctor with information about the size and shape of your heart and how well your heart's chambers and valves are working. This procedure takes approximately one hour. There are no restrictions for this procedure.    Follow-Up: Your physician recommends that you schedule a follow-up appointment in: 2-3 Months    Any Other Special Instructions Will Be Listed Below (If Applicable). Thank you for choosing [facility]!      If you need a refill on your cardiac medications before your next appointment, please call your pharmacy.

## 2016-11-13 ENCOUNTER — Ambulatory Visit: Payer: BLUE CROSS/BLUE SHIELD | Admitting: Gastroenterology

## 2016-11-14 ENCOUNTER — Ambulatory Visit (HOSPITAL_COMMUNITY)
Admission: RE | Admit: 2016-11-14 | Discharge: 2016-11-14 | Disposition: A | Discharge status: Home / Self Care | Payer: BLUE CROSS/BLUE SHIELD | Source: Ambulatory Visit | Attending: Student | Admitting: Student

## 2016-11-14 DIAGNOSIS — E6609 Other obesity due to excess calories: Secondary | ICD-10-CM | POA: Diagnosis not present

## 2016-11-14 DIAGNOSIS — E119 Type 2 diabetes mellitus without complications: Secondary | ICD-10-CM | POA: Insufficient documentation

## 2016-11-14 DIAGNOSIS — K219 Gastro-esophageal reflux disease without esophagitis: Secondary | ICD-10-CM | POA: Diagnosis not present

## 2016-11-14 DIAGNOSIS — J069 Acute upper respiratory infection, unspecified: Secondary | ICD-10-CM | POA: Diagnosis not present

## 2016-11-14 DIAGNOSIS — E785 Hyperlipidemia, unspecified: Secondary | ICD-10-CM | POA: Insufficient documentation

## 2016-11-14 DIAGNOSIS — I480 Paroxysmal atrial fibrillation: Secondary | ICD-10-CM | POA: Insufficient documentation

## 2016-11-14 DIAGNOSIS — Z1389 Encounter for screening for other disorder: Secondary | ICD-10-CM | POA: Diagnosis not present

## 2016-11-14 DIAGNOSIS — Z6835 Body mass index (BMI) 35.0-35.9, adult: Secondary | ICD-10-CM | POA: Diagnosis not present

## 2016-11-14 DIAGNOSIS — I10 Essential (primary) hypertension: Secondary | ICD-10-CM | POA: Diagnosis not present

## 2016-11-14 DIAGNOSIS — I4891 Unspecified atrial fibrillation: Secondary | ICD-10-CM | POA: Diagnosis not present

## 2016-11-14 DIAGNOSIS — Z23 Encounter for immunization: Secondary | ICD-10-CM | POA: Diagnosis not present

## 2016-11-14 DIAGNOSIS — I2510 Atherosclerotic heart disease of native coronary artery without angina pectoris: Secondary | ICD-10-CM | POA: Insufficient documentation

## 2016-11-14 LAB — ECHOCARDIOGRAM COMPLETE
AO mean calculated velocity dopler: 128 cm/s
AV Area VTI index: 1.02 cm2/m2
AV Area VTI: 2.54 cm2
AV Area mean vel: 2.54 cm2
AV Mean grad: 8 mmHg
AV Peak grad: 16 mmHg
AV VEL mean LVOT/AV: 0.67
AV area mean vel ind: 1.08 cm2/m2
AV peak Index: 1.08
AV pk vel: 202 cm/s
AV vel: 2.39
Ao pk vel: 0.67 m/s
E decel time: 246 msec
E/e' ratio: 15.41
FS: 38 % (ref 28–44)
IVS/LV PW RATIO, ED: 0.88
LA ID, A-P, ES: 43 mm
LA diam end sys: 43 mm
LA diam index: 1.84 cm/m2
LA vol A4C: 77.3 ml
LA vol index: 30.7 mL/m2
LA vol: 72 mL
LV E/e' medial: 15.41
LV E/e'average: 15.41
LV PW d: 12.5 mm — AB (ref 0.6–1.1)
LV dias vol index: 40 mL/m2
LV dias vol: 93 mL (ref 46–106)
LV e' LATERAL: 7.72 cm/s
LV sys vol index: 10 mL/m2
LV sys vol: 23 mL (ref 14–42)
LVOT SV: 111 mL
LVOT VTI: 29.1 cm
LVOT area: 3.8 cm2
LVOT diameter: 22 mm
LVOT peak VTI: 0.63 cm
LVOT peak grad rest: 7 mmHg
LVOT peak vel: 135 cm/s
Lateral S' vel: 11.2 cm/s
MV Dec: 246
MV Peak grad: 6 mmHg
MV pk A vel: 81.8 m/s
MV pk E vel: 119 m/s
RV sys press: 18 mmHg
Reg peak vel: 159 cm/s
Simpson's disk: 75
Stroke v: 70 ml
TAPSE: 20.7 mm
TDI e' lateral: 7.72
TDI e' medial: 6.53
TR max vel: 159 cm/s
VTI: 46.2 cm
Valve area index: 1.02
Valve area: 2.39 cm2

## 2016-11-14 NOTE — Progress Notes (Signed)
*  PRELIMINARY RESULTS* Echocardiogram 2D Echocardiogram has been performed.  Samuel Germany 11/14/2016, 4:08 PM

## 2016-11-17 ENCOUNTER — Encounter: Payer: Self-pay | Admitting: Nurse Practitioner

## 2016-11-17 ENCOUNTER — Ambulatory Visit: Payer: BLUE CROSS/BLUE SHIELD | Admitting: Nurse Practitioner

## 2016-11-17 VITALS — BP 126/86 | HR 69 | Temp 97.7°F | Ht 67.0 in | Wt 239.0 lb

## 2016-11-17 DIAGNOSIS — K219 Gastro-esophageal reflux disease without esophagitis: Secondary | ICD-10-CM | POA: Diagnosis not present

## 2016-11-17 DIAGNOSIS — R1031 Right lower quadrant pain: Secondary | ICD-10-CM

## 2016-11-17 DIAGNOSIS — R110 Nausea: Secondary | ICD-10-CM | POA: Diagnosis not present

## 2016-11-17 DIAGNOSIS — K5900 Constipation, unspecified: Secondary | ICD-10-CM | POA: Diagnosis not present

## 2016-11-17 MED ORDER — ONDANSETRON HCL 4 MG PO TABS: 4.0000 mg | ORAL_TABLET | Freq: Three times a day (TID) | ORAL | 2 refills | Status: DC | PRN

## 2016-11-17 NOTE — Assessment & Plan Note (Signed)
Persistent nausea that the patient states is unchanged, no better or no worse.  Given the fact that she had some chills, abdominal pain along with her nausea previously she could have had a gastroenteritis.  She did have loose stools at that time as well although this was attributed to Linzess at 2 high of a dose.  Her constipation is better managed on Linzess and no persistent loose stools.  At this point I will send in Zofran every 8 hours to help with symptomatic management and allow resolution of any stomach bug.  Return for follow-up in 3 months and if persistent nausea we can consider further testing such as gastric emptying test or CT of the abdomen.

## 2016-11-17 NOTE — Patient Instructions (Signed)
1. Continue your current medications. 2. I have refilled Zofran 4 mg to take every 8 hours as needed for nausea. 3. Return for follow-up in 3 months. 4. Call us if you have any questions or concerns.

## 2016-11-17 NOTE — Progress Notes (Signed)
cc'ed to pcp °

## 2016-11-17 NOTE — Assessment & Plan Note (Signed)
Her symptoms significantly improved on Dexilant every day and Zantac in the evening.  Recommend continue current medications for now.  Return for follow-up in 3 months.

## 2016-11-17 NOTE — Assessment & Plan Note (Signed)
Chronic constipation, was previously having significant loose stools with Linzess 145 mcg every other day.  Her dose was cut to 72 mcg daily.  Her symptoms have improved.  She has daily bowel movements which are typically soft/pass easily, occasional diarrhea.  Recommend she continue Linzess 72 mcg daily.  Return for follow-up in 3 months.

## 2016-11-17 NOTE — Progress Notes (Signed)
Referring Provider: Redmond School, MD Primary Care Physician:  Redmond School, MD Primary GI:  Dr. Oneida Alar  Chief Complaint  Patient presents with  . Gastroesophageal Reflux    f/u, doing better  . Nausea    HPI:   Becky [name] is a 56 y.o. female who presents for follow-up on GERD and nausea.  The patient was last seen in our office 10/03/2016 for GERD, constipation, right lower quadrant abdominal pain.  Colonoscopy up-to-date, EGD completed in December 2017 with gastritis but no H. pylori.  She was taking Linzess 145 every other day which caused watery stools.  Persistent right lower quadrant abdominal pain at that time which seems to be associated with constipation.  Some relief of her abdominal pain when she has a bowel movement.  Dysphasia improved, heartburn well controlled on Dexilant.  Recently started Cymbalta to help with pain management. Recommended switching Linzess to 72 mcg daily, continue Dexilant, complete iFOBT and consider colonoscopy if positive.  She would since been seen in the emergency department for upper abdominal pain, belching, acid-like taste.  Right upper quadrant ultrasound was planned.  Recommended ranitidine 150 mg at evening meal. iFOBT was negative for blood.  Right upper quadrant ultrasound completed 11/07/2016 which found no evidence of acute abnormality, hepatic steatosis.  Today she states her reflux is doing ok. Minimal to no abdominal pain. Still having some nausea but no vomiting. Denies hematochezia, melena, fever, chills, uacute changes in bowel habits. Still with some constipation but Linzess 72 mcg and has daily bowel movement with normal/bristol 4 stools and occasional watery stools. Has had increased fluctuance and belching. She ahs sinus issues and thinks some of her nausea could be related to sinus drainage. Denies chest pain, dyspnea, dizziness, lightheadedness, syncope, near syncope. Denies any other upper or lower GI symptoms.  NOTE:  Patient PMH and [place] incomplete in this note due to computer issues. See history tab for complete information. History tab information was reviewed with the patient and found to be correct.  Past Medical History:  Diagnosis Date  . Back pain   . Body aches 11/21/2014  . BV (bacterial vaginosis) 05/23/2013  . Constipation 11/21/2014  . Elevated cholesterol 11/02/2013  . Fibroids 03/13/2016  . Hematuria 05/23/2013  . Hyperlipidemia   . Hypertension   . Migraines   . PAF (paroxysmal atrial fibrillation) ([place])    a. diagnosed in 11/2016 --> started on Xarelto for anticoagulation  . Pelvic pain in female 11/02/2013  . Plantar fasciitis of right foot   . Thyroid disease   . Vaginal discharge 03/24/2014  . Vaginal irritation 05/23/2013  . Vaginal itching 03/24/2014    Past Surgical History:  Procedure Laterality Date  . ECTOPIC PREGNANCY SURGERY    . ESOPHAGOGASTRODUODENOSCOPY  12/19/2009   QBH:ALPFXT stricture s/p dilation/mild gastritis  . ileocolonoscopy  12/19/2009   KWI:[id number] polyps/mild left-side diverticulosis/hemorrhoids  . KNEE SURGERY    . TUBAL LIGATION      Current Outpatient Medications  Medication Sig Dispense Refill  . acetaminophen (TYLENOL) 500 MG tablet Take 500 mg by mouth every 6 (six) hours as needed for moderate pain or headache.     [name] THYROID 90 MG tablet Take 90 mg by mouth daily.   4  . budesonide (RHINOCORT AQUA) 32 MCG/ACT nasal spray Place 1 spray into both nostrils daily as needed for rhinitis.    . Cholecalciferol (VITAMIN D3) 5000 units CAPS Take 2 capsules by mouth daily as needed (for Vitamin D defficiency).    Marland Kitchen  DEXILANT 60 MG capsule TAKE ONE (1) CAPSULE EACH DAY 30 capsule 5  . diltiazem (CARDIZEM CD) 120 MG 24 hr capsule Take 1 capsule (120 mg total) daily by mouth. 90 capsule 3  . DULoxetine (CYMBALTA) 30 MG capsule Take 30 mg by mouth daily.     . Glycerin-Polysorbate 80 (REFRESH DRY EYE THERAPY OP) Apply 1 drop to eye daily as needed  (dry eyes).    Marland Kitchen linaclotide (LINZESS) 72 MCG capsule Take 1 capsule (72 mcg total) by mouth daily before breakfast. 30 capsule 11  . losartan (COZAAR) 100 MG tablet Take 100 mg by mouth daily.    . montelukast (SINGULAIR) 10 MG tablet Take 1 tablet by mouth daily.    . ondansetron (ZOFRAN) 4 MG tablet Take 4 mg by mouth every 8 (eight) hours as needed for nausea or vomiting.    . ranitidine (ZANTAC) 150 MG capsule Take 1 capsule (150 mg total) by mouth every evening. 30 capsule 5  . rivaroxaban (XARELTO) 20 MG TABS tablet Take 1 tablet (20 mg total) daily with supper by mouth. 90 tablet 3  . spironolactone (ALDACTONE) 25 MG tablet Take 1 tablet (25 mg total) by mouth daily. 90 tablet 3   No current facility-administered medications for this visit.     Allergies as of 11/17/2016 - Review Complete 11/17/2016  Allergen Reaction Noted  . Hydralazine Nausea Only 01/30/2016  . Prednisone Swelling 01/30/2015    Family History  Problem Relation Age of Onset  . Heart failure Mother   . Hypertension Mother   . Diabetes Mother   . Heart failure Father   . Hypertension Father   . Heart failure Brother   . Hypertension Sister   . Other Sister        blocked artery in neck; knee replacement  . Other Brother        triple bypass surgery  . Hypertension Sister   . Diabetes Sister   . Colon cancer Neg Hx     Social History   Socioeconomic History  . Marital status: Married    Spouse name: None  . Number of children: None  . Years of education: None  . Highest education level: None  Social Needs  . Financial resource strain: None  . Food insecurity - worry: None  . Food insecurity - inability: None  . Transportation needs - medical: None  . Transportation needs - non-medical: None  Occupational History  . None  Tobacco Use  . Smoking status: Former Smoker    Types: Cigarettes    Start date: 01/07/1975    Last attempt to quit: 01/06/1998    Years since quitting: 18.8  . Smokeless  tobacco: Never Used  Substance and Sexual Activity  . Alcohol use: No    Alcohol/week: 0.0 oz  . Drug use: No  . Sexual activity: Yes    Birth control/protection: Post-menopausal, Surgical    Comment: tubal  Other Topics Concern  . None  Social History Narrative  . None    Review of Systems: Complete ROS negative except as per HPI.   Physical Exam: BP 126/86   Pulse 69   Temp 97.7 F (36.5 C) (Oral)   Ht 5\' 7"  (1.702 m)   Wt 239 lb (108.4 kg)   BMI 37.43 kg/m  General:   Obese female. Alert and oriented. Pleasant and cooperative. Well-nourished and well-developed.  Eyes:  Without icterus, sclera clear and conjunctiva pink.  Ears:  Normal auditory acuity. Cardiovascular:  S1, S2 present without murmurs appreciated. Normal pulses noted. Extremities without clubbing or edema. Respiratory:  Clear to auscultation bilaterally. No wheezes, rales, or rhonchi. No distress.  Gastrointestinal:  +BS, rounded but soft, non-tender and non-distended. No HSM noted. No guarding or rebound. No masses appreciated.  Rectal:  Deferred  Musculoskalatal:  Symmetrical without gross deformities. Neurologic:  Alert and oriented x4;  grossly normal neurologically. Psych:  Alert and cooperative. Normal mood and affect. Heme/Lymph/Immune: No excessive bruising noted.    11/17/2016 10:15 AM   Disclaimer: This note was dictated with voice recognition software. Similar sounding words can inadvertently be transcribed and may not be corrected upon review.

## 2016-11-17 NOTE — Assessment & Plan Note (Signed)
Lower quadrant pain has resolved.  Continue to monitor.  Call if any worsening or recurrent symptoms.

## 2016-11-19 DIAGNOSIS — G4733 Obstructive sleep apnea (adult) (pediatric): Secondary | ICD-10-CM | POA: Diagnosis not present

## 2016-11-25 NOTE — Discharge Summary (Signed)
Physician Discharge Summary  Becky [name] SWN:[id number] DOB: [birth year] DOA: 11/07/2016  PCP: Redmond School, MD  Admit date: 11/07/2016 Discharge date: 11/09/2016  Time spent: 45 minutes  Recommendations for Outpatient Follow-up:  -Will be discharged home today. -Advised to follow up with PCP in 2 weeks.   Discharge Diagnoses:  Principal Problem:   Atrial fibrillation with RVR (HCC) Active Problems:   Essential hypertension   Chest pain   Coronary artery disease due to lipid rich plaque   Discharge Condition: Stable and improved  Filed Weights   11/07/16 2239 11/08/16 0626 11/09/16 0500  Weight: 103.8 kg (228 lb 13.4 oz) 109.3 kg (240 lb 15.4 oz) 108.2 kg (238 lb 8.6 oz)    History of present illness:  As per Dr. Myna Hidalgo on 11/2: Becky [name] is a 56 y.o. female with medical history significant for hypertension, GERD, and coronary artery disease, now presenting to the emergency department for evaluation of chest pain.  Patient reports that she been in her usual state of health until approximately 2 days ago when she noted the acute onset of right-sided, sharp, constant, nonradiating chest pain.  She reports that it is worse with deep inspiration and no alleviating factors have been identified.  Patient has not experienced the same symptoms previously.  Denies fevers or chills and denies any significant cough.  No lower extremity swelling or tenderness.  ED Course: Upon arrival to the ED, patient is found to be afebrile, saturating well on room air, tachycardic in the 130s, and with stable blood pressure.  EKG features atrial fibrillation with RVR, rate 136.  Chest x-ray is negative for acute cardiopulmonary disease, troponin is undetectable, TSH is normal, d-dimer is negative, and chemistry panel and CBC are normal patient was started on normal saline infusion, given a full dose of Lovenox, treated with morphine, and started on diltiazem infusion.  Heart rate has  improved significantly, blood pressure remained stable, with and the patient will be admitted to the stepdown unit for ongoing evaluation and management of new onset atrial fibrillation with rapid ventricular response.    Hospital Course:   Atrial fibrillation with rapid ventricular response -She has now converted to sinus rhythm. -Was initially on Cardizem drip but this is been transitioned to oral Cardizem. -Echo results as below -Meets criteria for anticoagulation, has been started on Xarelto.  Chest pain -Likely due to A. fib with rapid rates.   -Has ruled out for ACS with negative troponins and EKG without acute ischemic changes. -Echo with normal EF, no further cardiac workup at this time.  Hypertension -Well-controlled, continue losartan, Aldactone, will discontinue Norvasc and start Cardizem.    Procedures:  ECHO: EF 60-65% with grade 2 diastolic dysfunction, no wall motion abnormalities  Consultations:  None  Discharge Instructions  Discharge Instructions    Amb referral to AFIB Clinic   Complete by:  As directed    Diet - low sodium heart healthy   Complete by:  As directed    Increase activity slowly   Complete by:  As directed      Allergies as of 11/09/2016      Reactions   Hydralazine Nausea Only   Prednisone Swelling      Medication List    STOP taking these medications   ALKA-SELTZER PLS SINUS & COUGH PO   amLODipine 10 MG tablet Commonly known as:  NORVASC   clindamycin 150 MG capsule Commonly known as:  CLEOCIN   levocetirizine 5 MG tablet Commonly known  asHarlow Ohms   UNKNOWN TO PATIENT     TAKE these medications   acetaminophen 500 MG tablet Commonly known as:  TYLENOL Take 500 mg by mouth every 6 (six) hours as needed for moderate pain or headache.   ARMOUR THYROID 90 MG tablet Generic drug:  thyroid Take 90 mg by mouth daily.   budesonide 32 MCG/ACT nasal spray Commonly known as:  RHINOCORT AQUA Place 1 spray into both  nostrils daily as needed for rhinitis.   DEXILANT 60 MG capsule Generic drug:  dexlansoprazole TAKE ONE (1) CAPSULE EACH DAY   DULoxetine 30 MG capsule Commonly known as:  CYMBALTA Take 30 mg by mouth daily.   linaclotide 72 MCG capsule Commonly known as:  LINZESS Take 1 capsule (72 mcg total) by mouth daily before breakfast.   losartan 100 MG tablet Commonly known as:  COZAAR Take 100 mg by mouth daily.   montelukast 10 MG tablet Commonly known as:  SINGULAIR Take 1 tablet by mouth daily.   ranitidine 150 MG capsule Commonly known as:  ZANTAC Take 1 capsule (150 mg total) by mouth every evening.   REFRESH DRY EYE THERAPY OP Apply 1 drop to eye daily as needed (dry eyes).   spironolactone 25 MG tablet Commonly known as:  ALDACTONE Take 1 tablet (25 mg total) by mouth daily.   Vitamin D3 5000 units Caps Take 2 capsules by mouth daily as needed (for Vitamin D defficiency).      Allergies  Allergen Reactions  . Hydralazine Nausea Only  . Prednisone Swelling   Follow-up Information    Redmond School, MD. Schedule an appointment as soon as possible for a visit in 2 week(s).   Specialty:  Internal Medicine Contact information: [address] [place] [address] [phone]        Herminio Commons, MD. Schedule an appointment as soon as possible for a visit in 2 week(s).   Specialty:  Cardiology Contact information: [place] [address] [phone]            The results of significant diagnostics from this hospitalization (including imaging, microbiology, ancillary and laboratory) are listed below for reference.    Significant Diagnostic Studies: Dg Chest 2 View  Result Date: 11/07/2016 CLINICAL DATA:  56 year old female with chest pain. EXAM: CHEST  2 VIEW COMPARISON:  Chest radiograph dated 10/27/2016 FINDINGS: The heart size and mediastinal contours are within normal limits. Both lungs are clear. The visualized skeletal  structures are unremarkable. IMPRESSION: No active cardiopulmonary disease. Electronically Signed   By: Anner Crete M.D.   On: 11/07/2016 19:00   Dg Chest 2 View  Result Date: 10/27/2016 CLINICAL DATA:  Left shoulder pain and nausea. EXAM: CHEST  2 VIEW COMPARISON:  05/22/2015 FINDINGS: The lungs are clear without focal pneumonia, edema, pneumothorax or pleural effusion. Cardiopericardial silhouette is at upper limits of normal for size. The visualized bony structures of the thorax are intact. Telemetry leads overlie the chest. IMPRESSION: No active cardiopulmonary disease. Electronically Signed   By: Misty Stanley M.D.   On: 10/27/2016 14:55   US Abdomen Limited Ruq/gall Gladder  Result Date: 11/07/2016 CLINICAL DATA:  56 year old female with acute right upper quadrant pain for 1 week. EXAM: ULTRASOUND ABDOMEN LIMITED RIGHT UPPER QUADRANT COMPARISON:  07/30/2006 ultrasound FINDINGS: Gallbladder: The gallbladder is unremarkable. There is no evidence of cholelithiasis or acute cholecystitis. Common bile duct: Diameter: 2.3 mm. No intrahepatic or extrahepatic biliary dilatation. Liver: Diffusely increased hepatic echogenicity noted.  No focal abnormalities are present. Portal vein is patent on color Doppler imaging with normal direction of blood flow towards the liver. IMPRESSION: 1. No evidence of acute abnormality 2. Hepatic steatosis Electronically Signed   By: Margarette Canada M.D.   On: 11/07/2016 10:51    Microbiology: No results found for this or any previous visit (from the past 240 hour(s)).   Labs: Basic Metabolic Panel: No results for input(s): NA, K, CL, CO2, GLUCOSE, BUN, CREATININE, CALCIUM, MG, PHOS in the last 168 hours. Liver Function Tests: No results for input(s): AST, ALT, ALKPHOS, BILITOT, PROT, ALBUMIN in the last 168 hours. No results for input(s): LIPASE, AMYLASE in the last 168 hours. No results for input(s): AMMONIA in the last 168 hours. CBC: No results for input(s):  WBC, NEUTROABS, HGB, HCT, MCV, PLT in the last 168 hours. Cardiac Enzymes: No results for input(s): CKTOTAL, CKMB, CKMBINDEX, TROPONINI in the last 168 hours. BNP: BNP (last 3 results) No results for input(s): BNP in the last 8760 hours.  ProBNP (last 3 results) No results for input(s): PROBNP in the last 8760 hours.  CBG: No results for input(s): GLUCAP in the last 168 hours.     Signed:  Lelon Frohlich  [facility] Pager: [phone] 11/25/2016, 6:46 PM

## 2016-12-09 DIAGNOSIS — R609 Edema, unspecified: Secondary | ICD-10-CM | POA: Diagnosis not present

## 2016-12-12 ENCOUNTER — Ambulatory Visit: Payer: BLUE CROSS/BLUE SHIELD | Admitting: Gastroenterology

## 2016-12-19 DIAGNOSIS — G4733 Obstructive sleep apnea (adult) (pediatric): Secondary | ICD-10-CM | POA: Diagnosis not present

## 2017-01-01 ENCOUNTER — Encounter: Payer: Self-pay | Admitting: Adult Health

## 2017-01-01 ENCOUNTER — Encounter (INDEPENDENT_AMBULATORY_CARE_PROVIDER_SITE_OTHER): Payer: Self-pay

## 2017-01-01 ENCOUNTER — Ambulatory Visit (INDEPENDENT_AMBULATORY_CARE_PROVIDER_SITE_OTHER): Payer: BLUE CROSS/BLUE SHIELD | Admitting: Adult Health

## 2017-01-01 ENCOUNTER — Other Ambulatory Visit: Payer: Self-pay | Admitting: Adult Health

## 2017-01-01 VITALS — BP 144/72 | HR 78 | Ht 67.75 in | Wt 243.0 lb

## 2017-01-01 DIAGNOSIS — Z01419 Encounter for gynecological examination (general) (routine) without abnormal findings: Secondary | ICD-10-CM | POA: Diagnosis not present

## 2017-01-01 DIAGNOSIS — Z1212 Encounter for screening for malignant neoplasm of rectum: Secondary | ICD-10-CM

## 2017-01-01 DIAGNOSIS — R319 Hematuria, unspecified: Secondary | ICD-10-CM

## 2017-01-01 DIAGNOSIS — Z1321 Encounter for screening for nutritional disorder: Secondary | ICD-10-CM

## 2017-01-01 DIAGNOSIS — N898 Other specified noninflammatory disorders of vagina: Secondary | ICD-10-CM

## 2017-01-01 DIAGNOSIS — Z1211 Encounter for screening for malignant neoplasm of colon: Secondary | ICD-10-CM

## 2017-01-01 DIAGNOSIS — Z01411 Encounter for gynecological examination (general) (routine) with abnormal findings: Secondary | ICD-10-CM | POA: Diagnosis not present

## 2017-01-01 DIAGNOSIS — Z1231 Encounter for screening mammogram for malignant neoplasm of breast: Secondary | ICD-10-CM

## 2017-01-01 DIAGNOSIS — R300 Dysuria: Secondary | ICD-10-CM

## 2017-01-01 DIAGNOSIS — Z131 Encounter for screening for diabetes mellitus: Secondary | ICD-10-CM

## 2017-01-01 LAB — HEMOCCULT GUIAC POC 1CARD (OFFICE): Fecal Occult Blood, POC: NEGATIVE

## 2017-01-01 LAB — POCT URINALYSIS DIPSTICK
Glucose, UA: NEGATIVE
Ketones, UA: NEGATIVE
Leukocytes, UA: NEGATIVE
Nitrite, UA: NEGATIVE
Protein, UA: NEGATIVE

## 2017-01-01 LAB — POCT WET PREP (WET MOUNT): Clue Cells Wet Prep Whiff POC: NEGATIVE

## 2017-01-01 MED ORDER — NYSTATIN-TRIAMCINOLONE 100000-0.1 UNIT/GM-% EX CREA: 1.0000 "application " | TOPICAL_CREAM | Freq: Two times a day (BID) | CUTANEOUS | 0 refills | Status: DC

## 2017-01-01 NOTE — Progress Notes (Signed)
Patient ID: Becky [name], female   DOB: 22-Apr-1960, 56 y.o.   MRN: [id number] History of Present Illness: Becky [name] is a 56 year old black female, married in for well woman gyn exam exam,she had normal pap with negative HPV 11/21/14. PCP is Dr Gerarda Fraction and sees Walden Field, NP at [facility] and also cardiologist for A -fib.   Current Medications, Allergies, Past Medical History, Past Surgical History, Family History and Social History were reviewed in [employer] record.     Review of Systems: Patient denies any headaches, hearing loss, fatigue, blurred vision, shortness of breath, chest pain, abdominal pain, problems with bowel movements, or intercourse. No joint pain or mood swings.+burning with urination and vaginal discharge,feels moist, and legs ache, esp at night,and gets irritated under breast at times     Physical Exam:BP (!) 144/72 (BP Location: Left Arm, Patient Position: Sitting, Cuff Size: Large)   Pulse 78   Ht 5' 7.75" (1.721 m)   Wt 243 lb (110.2 kg)   BMI 37.22 kg/m Urine 3+ blood General:  Well developed, well nourished, no acute distress Skin:  Warm and dry Neck:  Midline trachea, normal thyroid, good ROM, no lymphadenopathy Lungs; Clear to auscultation bilaterally Breast:  No dominant palpable mass, retraction, or nipple discharge Cardiovascular: Regular rate and rhythm Abdomen:  Soft, non tender, no hepatosplenomegaly Pelvic:  External genitalia is normal in appearance, no lesions.  The vagina is normal in appearance,white discharge no odor. Urethra has no lesions or masses. The cervix is bulbous.  Uterus is felt to be normal size, shape, and contour.  No adnexal masses or tenderness noted.Bladder is non tender, no masses felt.wet prep, few WBCs, no yeast or BV. Rectal: Good sphincter tone, no polyps, or hemorrhoids felt.  Hemoccult negative. Extremities/musculoskeletal:  No swelling or varicosities noted, no clubbing or cyanosis Psych:  No mood changes,  alert and cooperative,seems happy PHQ 2 score 0.  Impression: 1. Encounter for well woman exam with routine gynecological exam   2. Burning with urination   3. Hematuria, unspecified type   4. Screening for colorectal cancer   5. Vaginal discharge   6. Screening for diabetes mellitus   7. Encounter for vitamin deficiency screening       Plan: UA C&S sent Check CBC,CMP,TSH and lipids,A1c and vitamin D Rx mytrex cream use bid prn under breast and vulva area  Pap and physical in 1 year Mammogram now and yearly  Try rubbing legs with oil before bed and wrap in warm towel

## 2017-01-02 LAB — COMPREHENSIVE METABOLIC PANEL
ALT: 22 IU/L (ref 0–32)
AST: 18 IU/L (ref 0–40)
Albumin/Globulin Ratio: 2.1 (ref 1.2–2.2)
Albumin: 4.4 g/dL (ref 3.5–5.5)
Alkaline Phosphatase: 74 IU/L (ref 39–117)
BUN/Creatinine Ratio: 13 (ref 9–23)
BUN: 11 mg/dL (ref 6–24)
Bilirubin Total: 0.5 mg/dL (ref 0.0–1.2)
CO2: 26 mmol/L (ref 20–29)
Calcium: 9.6 mg/dL (ref 8.7–10.2)
Chloride: 104 mmol/L (ref 96–106)
Creatinine, Ser: 0.87 mg/dL (ref 0.57–1.00)
GFR calc Af Amer: 87 mL/min/{1.73_m2} (ref >59)
GFR calc non Af Amer: 75 mL/min/{1.73_m2} (ref >59)
Globulin, Total: 2.1 g/dL (ref 1.5–4.5)
Glucose: 105 mg/dL — ABNORMAL HIGH (ref 65–99)
Potassium: 4.3 mmol/L (ref 3.5–5.2)
Sodium: 143 mmol/L (ref 134–144)
Total Protein: 6.5 g/dL (ref 6.0–8.5)

## 2017-01-02 LAB — CBC
Hematocrit: 36.6 % (ref 34.0–46.6)
Hemoglobin: 12.6 g/dL (ref 11.1–15.9)
MCH: 30.6 pg (ref 26.6–33.0)
MCHC: 34.4 g/dL (ref 31.5–35.7)
MCV: 89 fL (ref 79–97)
Platelets: 350 10*3/uL (ref 150–379)
RBC: 4.12 x10E6/uL (ref 3.77–5.28)
RDW: 13.9 % (ref 12.3–15.4)
WBC: 6.7 10*3/uL (ref 3.4–10.8)

## 2017-01-02 LAB — URINALYSIS, ROUTINE W REFLEX MICROSCOPIC
Bilirubin, UA: NEGATIVE
Glucose, UA: NEGATIVE
Ketones, UA: NEGATIVE
Nitrite, UA: NEGATIVE
Protein, UA: NEGATIVE
Specific Gravity, UA: 1.015 (ref 1.005–1.030)
Urobilinogen, Ur: 0.2 mg/dL (ref 0.2–1.0)
pH, UA: 6 (ref 5.0–7.5)

## 2017-01-02 LAB — HEMOGLOBIN A1C
Est. average glucose Bld gHb Est-mCnc: 137 mg/dL
Hgb A1c MFr Bld: 6.4 % — ABNORMAL HIGH (ref 4.8–5.6)

## 2017-01-02 LAB — VITAMIN D 25 HYDROXY (VIT D DEFICIENCY, FRACTURES): Vit D, 25-Hydroxy: 52.7 ng/mL (ref 30.0–100.0)

## 2017-01-02 LAB — MICROSCOPIC EXAMINATION: Casts: NONE SEEN /lpf

## 2017-01-02 LAB — LIPID PANEL
Chol/HDL Ratio: 4.1 ratio (ref 0.0–4.4)
Cholesterol, Total: 231 mg/dL — ABNORMAL HIGH (ref 100–199)
HDL: 57 mg/dL (ref >39)
LDL Calculated: 154 mg/dL — ABNORMAL HIGH (ref 0–99)
Triglycerides: 100 mg/dL (ref 0–149)
VLDL Cholesterol Cal: 20 mg/dL (ref 5–40)

## 2017-01-02 LAB — TSH: TSH: 1.68 u[IU]/mL (ref 0.450–4.500)

## 2017-01-03 LAB — URINE CULTURE: Organism ID, Bacteria: NO GROWTH

## 2017-01-05 ENCOUNTER — Telehealth: Payer: Self-pay | Admitting: Adult Health

## 2017-01-05 ENCOUNTER — Inpatient Hospital Stay (HOSPITAL_COMMUNITY): Admission: RE | Admit: 2017-01-05 | Payer: BLUE CROSS/BLUE SHIELD | Source: Ambulatory Visit

## 2017-01-05 DIAGNOSIS — R609 Edema, unspecified: Secondary | ICD-10-CM | POA: Diagnosis not present

## 2017-01-05 MED ORDER — FLUCONAZOLE 150 MG PO TABS
ORAL_TABLET | ORAL | 1 refills | Status: DC
Start: 2017-01-05 — End: 2017-02-18

## 2017-01-05 NOTE — Telephone Encounter (Signed)
Left message that diflucan sent to [facility] and to call me back about labs

## 2017-01-05 NOTE — Telephone Encounter (Signed)
Pt aware of labs and need to cut carbs, will refer to [facility] to evaluate hematuria, had seen Dr Maryland Pink years ago.

## 2017-01-05 NOTE — Telephone Encounter (Signed)
Patient reports that she is having burning, irritation and itching along with vaginal discharge. Will send message to [facility] to see if she can prescribe anything.

## 2017-01-12 ENCOUNTER — Encounter: Payer: Self-pay | Admitting: Cardiovascular Disease

## 2017-01-12 ENCOUNTER — Ambulatory Visit (INDEPENDENT_AMBULATORY_CARE_PROVIDER_SITE_OTHER): Payer: BLUE CROSS/BLUE SHIELD | Admitting: Cardiovascular Disease

## 2017-01-12 VITALS — BP 180/90 | HR 80 | Ht 67.0 in | Wt 247.0 lb

## 2017-01-12 DIAGNOSIS — E782 Mixed hyperlipidemia: Secondary | ICD-10-CM | POA: Diagnosis not present

## 2017-01-12 DIAGNOSIS — Z79899 Other long term (current) drug therapy: Secondary | ICD-10-CM

## 2017-01-12 DIAGNOSIS — R9439 Abnormal result of other cardiovascular function study: Secondary | ICD-10-CM

## 2017-01-12 DIAGNOSIS — I480 Paroxysmal atrial fibrillation: Secondary | ICD-10-CM | POA: Diagnosis not present

## 2017-01-12 DIAGNOSIS — I10 Essential (primary) hypertension: Secondary | ICD-10-CM

## 2017-01-12 DIAGNOSIS — I119 Hypertensive heart disease without heart failure: Secondary | ICD-10-CM

## 2017-01-12 MED ORDER — ROSUVASTATIN CALCIUM 5 MG PO TABS: 5.0000 mg | ORAL_TABLET | Freq: Every day | ORAL | 3 refills | Status: DC

## 2017-01-12 NOTE — Progress Notes (Signed)
SUBJECTIVE: The patient presents for routine follow-up.  She was hospitalized for rapid atrial fibrillation in November 2018 and was initiated on diltiazem and Xarelto. She underwent a low risk nuclear stress test in May 2017 which showed a small mild apical infarct with mild peri-infarct ischemia.  Echocardiogram 11/14/16: Normal left ventricular systolic function with mild LVH, LVEF 96-78%, grade 2 diastolic dysfunction with elevated filling pressures, mild left atrial enlargement.  She denies chest pain, palpitations, bleeding problems, or any significant shortness of breath.  Blood pressure is significantly elevated today, 180/90.  She does not remember if she took her medications yesterday.  She has not taken any today but plans to do so when she gets home.  She does check her blood pressure at home regularly and says it has been normal.     Review of Systems: As per "subjective", otherwise negative.  Allergies  Allergen Reactions  . Hydralazine Nausea Only  . Prednisone Swelling    Current Outpatient Medications  Medication Sig Dispense Refill  . acetaminophen (TYLENOL) 500 MG tablet Take 500 mg by mouth every 6 (six) hours as needed for moderate pain or headache.     [name] THYROID 90 MG tablet Take 90 mg by mouth daily.   4  . budesonide (RHINOCORT AQUA) 32 MCG/ACT nasal spray Place 1 spray into both nostrils daily as needed for rhinitis.    . Cholecalciferol (VITAMIN D3) 5000 units CAPS Take 2 capsules by mouth daily as needed (for Vitamin D defficiency).    . DEXILANT 60 MG capsule TAKE ONE (1) CAPSULE EACH DAY 30 capsule 5  . diltiazem (CARDIZEM CD) 120 MG 24 hr capsule Take 1 capsule (120 mg total) daily by mouth. 90 capsule 3  . DULoxetine (CYMBALTA) 30 MG capsule Take 30 mg by mouth daily.     . fluconazole (DIFLUCAN) 150 MG tablet Take 1 now and 1 in 3 days 2 tablet 1  . Glycerin-Polysorbate 80 (REFRESH DRY EYE THERAPY OP) Apply 1 drop to eye daily as needed  (dry eyes).    Marland Kitchen linaclotide (LINZESS) 72 MCG capsule Take 1 capsule (72 mcg total) by mouth daily before breakfast. 30 capsule 11  . losartan (COZAAR) 100 MG tablet Take 100 mg by mouth daily.    . montelukast (SINGULAIR) 10 MG tablet Take 1 tablet by mouth daily.    Marland Kitchen nystatin-triamcinolone (MYCOLOG II) cream Apply 1 application topically 2 (two) times daily. 30 g 0  . ondansetron (ZOFRAN) 4 MG tablet Take 1 tablet (4 mg total) every 8 (eight) hours as needed by mouth for nausea or vomiting. 20 tablet 2  . ranitidine (ZANTAC) 150 MG capsule Take 1 capsule (150 mg total) by mouth every evening. 30 capsule 5  . rivaroxaban (XARELTO) 20 MG TABS tablet Take 1 tablet (20 mg total) daily with supper by mouth. 90 tablet 3  . spironolactone (ALDACTONE) 25 MG tablet Take 1 tablet (25 mg total) by mouth daily. 90 tablet 3   No current facility-administered medications for this visit.     Past Medical History:  Diagnosis Date  . Back pain   . Body aches 11/21/2014  . BV (bacterial vaginosis) 05/23/2013  . Constipation 11/21/2014  . Elevated cholesterol 11/02/2013  . Fibroids 03/13/2016  . Hematuria 05/23/2013  . Hyperlipidemia   . Hypertension   . Migraines   . PAF (paroxysmal atrial fibrillation) ([place])    a. diagnosed in 11/2016 --> started on Xarelto for anticoagulation  .  Pelvic pain in female 11/02/2013  . Plantar fasciitis of right foot   . Thyroid disease   . Vaginal discharge 03/24/2014  . Vaginal irritation 05/23/2013  . Vaginal itching 03/24/2014    Past Surgical History:  Procedure Laterality Date  . ECTOPIC PREGNANCY SURGERY    . ESOPHAGOGASTRODUODENOSCOPY  12/19/2009   ION:[id number] stricture s/p dilation/mild gastritis  . ESOPHAGOGASTRODUODENOSCOPY N/A 12/10/2015   Procedure: ESOPHAGOGASTRODUODENOSCOPY (EGD);  Surgeon: Danie Binder, MD;  [facility];  Service: Endoscopy;  Laterality: N/A;  830  . ileocolonoscopy  12/19/2009   [id number] polyps/mild left-side  diverticulosis/hemorrhoids  . KNEE SURGERY    . TUBAL LIGATION      Social History   Socioeconomic History  . Marital status: Married    Spouse name: Not on file  . Number of children: Not on file  . Years of education: Not on file  . Highest education level: Not on file  Social Needs  . Financial resource strain: Not on file  . Food insecurity - worry: Not on file  . Food insecurity - inability: Not on file  . Transportation needs - medical: Not on file  . Transportation needs - non-medical: Not on file  Occupational History  . Not on file  Tobacco Use  . Smoking status: Former Smoker    Types: Cigarettes    Start date: 01/07/1975    Last attempt to quit: 2005    Years since quitting: 14.0  . Smokeless tobacco: Never Used  Substance and Sexual Activity  . Alcohol use: No    Alcohol/week: 0.0 oz  . Drug use: No  . Sexual activity: Yes    Birth control/protection: Post-menopausal, Surgical    Comment: tubal  Other Topics Concern  . Not on file  Social History Narrative  . Not on file     Vitals:   01/12/17 1112  BP: (!) 180/90  Pulse: 80  SpO2: 98%  Weight: 247 lb (112 kg)  Height: 5\' 7"  (1.702 m)    Wt Readings from Last 3 Encounters:  01/12/17 247 lb (112 kg)  01/01/17 243 lb (110.2 kg)  11/17/16 239 lb (108.4 kg)     PHYSICAL EXAM General: NAD HEENT: Normal. Neck: No JVD, no thyromegaly. Lungs: Clear to auscultation bilaterally with normal respiratory effort. CV: Regular rate and rhythm, normal S1/S2, no S3/S4, no murmur. No pretibial or periankle edema.  No carotid bruit.   Abdomen: Soft, nontender, no distention.  Neurologic: Alert and oriented.  Psych: Normal affect. Skin: Normal. Musculoskeletal: No gross deformities.    ECG: Most recent ECG reviewed.   Labs: Lab Results  Component Value Date/Time   K 4.3 01/01/2017 11:39 AM   BUN 11 01/01/2017 11:39 AM   CREATININE 0.87 01/01/2017 11:39 AM   CREATININE 0.77 08/01/2015 12:29 PM    ALT 22 01/01/2017 11:39 AM   TSH 1.680 01/01/2017 11:39 AM   HGB 12.6 01/01/2017 11:39 AM     Lipids: Lab Results  Component Value Date/Time   LDLCALC 154 (H) 01/01/2017 11:39 AM   CHOL 231 (H) 01/01/2017 11:39 AM   TRIG 100 01/01/2017 11:39 AM   HDL 57 01/01/2017 11:39 AM       ASSESSMENT AND PLAN: 1.  Paroxysmal atrial fibrillation: Symptomatically stable on long-acting diltiazem.  Anticoagulated with Xarelto.  Mild left atrial enlargement.  No changes to therapy.  2.  Hypertensive heart disease: Most recent echocardiogram from November 2018 reviewed above.  She is euvolemic.  Blood pressure is  significantly elevated today, 180/90.  She does not remember if she took her medications yesterday.  She has not taken any today but plans to do so when she gets home.  She does check her blood pressure at home regularly and says it has been normal.  I have asked her to check her blood pressure 3-4 times per week and let me know what those results are after having done so for about a month.  3.  Abnormal nuclear stress test: Stress test results detailed above from 2017.  Symptomatically stable.  No changes to therapy.  4.  Hypertension: Blood pressure is significantly elevated today, 180/90.  She does not remember if she took her medications yesterday.  She has not taken any today but plans to do so when she gets home.  She does check her blood pressure at home regularly and says it has been normal.  I have asked her to check her blood pressure 3-4 times per week and let me know what those results are after having done so for about a month.  5.  Hyperlipidemia: Lipids on 01/01/17 showed total cholesterol elevated at 231, triglycerides 100, HDL 57, LDL 154.  She developed myalgias with Lipitor and quit taking it.  I will try Crestor 5 mg and repeat lipids in 3 months.    Disposition: Follow up 6 months   Kate Sable, M.D., F.A.C.C.

## 2017-01-12 NOTE — Patient Instructions (Addendum)
Your physician wants you to follow-up in: 6 months with Dr.Koneswaran You will receive a reminder letter in the mail two months in advance. If you don't receive a letter, please call our office to schedule the follow-up appointment.    START Crestor 5 mg at dinner   After 3 months on Crestor, get a FASTING Lipid panel    No tests ordered today.    If you need a refill on your cardiac medications before your next appointment, please call your pharmacy.      Thank you for choosing [facility] !

## 2017-01-14 ENCOUNTER — Ambulatory Visit (HOSPITAL_COMMUNITY): Payer: BLUE CROSS/BLUE SHIELD

## 2017-01-19 DIAGNOSIS — G4733 Obstructive sleep apnea (adult) (pediatric): Secondary | ICD-10-CM | POA: Diagnosis not present

## 2017-01-20 ENCOUNTER — Encounter: Payer: Self-pay | Admitting: Adult Health

## 2017-01-20 NOTE — Progress Notes (Signed)
Becky [name] referred pt to [facility] , Appointment has been made for 02-27-17 at 10:00 with Dr Jeffie Pollock in [place]/ Per [facility] pt is aware.

## 2017-01-21 ENCOUNTER — Ambulatory Visit (HOSPITAL_COMMUNITY)
Admission: RE | Admit: 2017-01-21 | Discharge: 2017-01-21 | Disposition: A | Discharge status: Home / Self Care | Payer: BLUE CROSS/BLUE SHIELD | Source: Ambulatory Visit | Attending: Adult Health | Admitting: Adult Health

## 2017-01-21 DIAGNOSIS — Z1231 Encounter for screening mammogram for malignant neoplasm of breast: Secondary | ICD-10-CM | POA: Diagnosis not present

## 2017-02-18 ENCOUNTER — Encounter: Payer: Self-pay | Admitting: Nurse Practitioner

## 2017-02-18 ENCOUNTER — Ambulatory Visit (INDEPENDENT_AMBULATORY_CARE_PROVIDER_SITE_OTHER): Payer: BLUE CROSS/BLUE SHIELD | Admitting: Nurse Practitioner

## 2017-02-18 VITALS — BP 120/86 | HR 91 | Temp 97.4°F | Ht 67.0 in | Wt 240.8 lb

## 2017-02-18 DIAGNOSIS — K219 Gastro-esophageal reflux disease without esophagitis: Secondary | ICD-10-CM

## 2017-02-18 DIAGNOSIS — K5900 Constipation, unspecified: Secondary | ICD-10-CM

## 2017-02-18 NOTE — Progress Notes (Signed)
CC'D TO PCP °

## 2017-02-18 NOTE — Assessment & Plan Note (Signed)
Generally well controlled on Linzess 72 mcg.  145 mcg dose was too high and cause diarrhea.  She does have intermittent hard stools.  I recommended she take either dose of MiraLAX or Colace, as needed for any episodic hard stools/straining.  She does still have bowel movements daily and is overall satisfied with her progress.  Return for follow-up in 6 months.

## 2017-02-18 NOTE — Patient Instructions (Signed)
1. Continue current medications. 2. If you have some episodes of harder stools or straining you can take a Colace stool softener (over-the-counter) or a dose of MiraLAX (over-the-counter) to help. 3. Return for follow-up in 6 months. 4. Call us if you have any questions or concerns.

## 2017-02-18 NOTE — Progress Notes (Signed)
Referring Provider: Redmond School, MD Primary Care Physician:  Redmond School, MD Primary GI:  Dr. Oneida Alar  Chief Complaint  Patient presents with  . Gastroesophageal Reflux    doing better  . Constipation    doing better     HPI:   Becky [name] is a 57 y.o. female who presents for follow-up on GERD.  The patient was last seen in our office 11/17/2016 for the same.  Colonoscopy and EGD up-to-date and on file.  Emergency room visit just prior to her last visit for upper abdominal pain with prescription for ranitidine 150 mg at the evening meal, iFob negative for blood, right upper quadrant ultrasound without evidence of abnormality.  At her last visit her symptoms were improved with minimal to no abdominal pain.  Some nausea.  Some intermittent constipation but overall Linzess 72 mcg controls her symptoms.  She feels some of her nausea is due to sinus issues and sinus drainage.  No other GI symptoms.  Recommend continue her current medications, specifically Dexilant and ranitidine.  Zofran was refilled for symptomatic management of nausea.  Follow-up in 3 months.  Today she states she's doing well overall. Occasional lower abdominal pain which improves after a bowel movement. Linzess 72 mcg generally controls her symptoms mostly. She can tell if she misses a dose at which point it takes a couple days to kick back in. Stools are sometimes hard. Denies hematochezia, melena, unintentional weight loss, acute bowel habit changes. GERD much improved, only one episode of breakthrough in 3 months. On Dexilant and Zantac. Denies chest pain, dyspnea, dizziness, lightheadedness, syncope, near syncope. Denies any other upper or lower GI symptoms.  Past Medical History:  Diagnosis Date  . Back pain   . Body aches 11/21/2014  . BV (bacterial vaginosis) 05/23/2013  . Constipation 11/21/2014  . Elevated cholesterol 11/02/2013  . Fibroids 03/13/2016  . Hematuria 05/23/2013  . Hyperlipidemia   .  Hypertension   . Migraines   . PAF (paroxysmal atrial fibrillation) ([place])    a. diagnosed in 11/2016 --> started on Xarelto for anticoagulation  . Pelvic pain in female 11/02/2013  . Plantar fasciitis of right foot   . Thyroid disease   . Vaginal discharge 03/24/2014  . Vaginal irritation 05/23/2013  . Vaginal itching 03/24/2014    Past Surgical History:  Procedure Laterality Date  . ECTOPIC PREGNANCY SURGERY    . ESOPHAGOGASTRODUODENOSCOPY  12/19/2009   EVO:[id number] stricture s/p dilation/mild gastritis  . ESOPHAGOGASTRODUODENOSCOPY N/A 12/10/2015   Procedure: ESOPHAGOGASTRODUODENOSCOPY (EGD);  Surgeon: Danie Binder, MD;  [facility];  Service: Endoscopy;  Laterality: N/A;  830  . ileocolonoscopy  12/19/2009   GHW:[id number] polyps/mild left-side diverticulosis/hemorrhoids  . KNEE SURGERY    . TUBAL LIGATION      Current Outpatient Medications  Medication Sig Dispense Refill  . acetaminophen (TYLENOL) 500 MG tablet Take 500 mg by mouth every 6 (six) hours as needed for moderate pain or headache.     [name] THYROID 90 MG tablet Take 90 mg by mouth daily.   4  . budesonide (RHINOCORT AQUA) 32 MCG/ACT nasal spray Place 1 spray into both nostrils daily as needed for rhinitis.    . Cholecalciferol (VITAMIN D3) 5000 units CAPS Take 2 capsules by mouth daily as needed (for Vitamin D defficiency).    . DEXILANT 60 MG capsule TAKE ONE (1) CAPSULE EACH DAY 30 capsule 5  . diltiazem (CARDIZEM CD) 120 MG 24 hr capsule Take 1 capsule (  120 mg total) daily by mouth. 90 capsule 3  . Glycerin-Polysorbate 80 (REFRESH DRY EYE THERAPY OP) Apply 1 drop to eye daily as needed (dry eyes).    Marland Kitchen linaclotide (LINZESS) 72 MCG capsule Take 1 capsule (72 mcg total) by mouth daily before breakfast. 30 capsule 11  . losartan (COZAAR) 100 MG tablet Take 100 mg by mouth daily.    . montelukast (SINGULAIR) 10 MG tablet Take 1 tablet by mouth daily.    . ondansetron (ZOFRAN) 4 MG tablet Take 1 tablet  (4 mg total) every 8 (eight) hours as needed by mouth for nausea or vomiting. 20 tablet 2  . ranitidine (ZANTAC) 150 MG capsule Take 1 capsule (150 mg total) by mouth every evening. 30 capsule 5  . rivaroxaban (XARELTO) 20 MG TABS tablet Take 1 tablet (20 mg total) daily with supper by mouth. 90 tablet 3  . rosuvastatin (CRESTOR) 5 MG tablet Take 1 tablet (5 mg total) by mouth daily. 90 tablet 3  . spironolactone (ALDACTONE) 25 MG tablet Take 1 tablet (25 mg total) by mouth daily. 90 tablet 3   No current facility-administered medications for this visit.     Allergies as of 02/18/2017 - Review Complete 02/18/2017  Allergen Reaction Noted  . Hydralazine Nausea Only 01/30/2016  . Prednisone Swelling 01/30/2015    Family History  Problem Relation Age of Onset  . Heart failure Mother   . Hypertension Mother   . Diabetes Mother   . Heart failure Father   . Hypertension Father   . Heart failure Brother   . Hypertension Sister   . Other Sister        blocked artery in neck; knee replacement  . Other Brother        triple bypass surgery  . Hypertension Sister   . Diabetes Sister   . Colon cancer Neg Hx     Social History   Socioeconomic History  . Marital status: Married    Spouse name: None  . Number of children: None  . Years of education: None  . Highest education level: None  Social Needs  . Financial resource strain: None  . Food insecurity - worry: None  . Food insecurity - inability: None  . Transportation needs - medical: None  . Transportation needs - non-medical: None  Occupational History  . None  Tobacco Use  . Smoking status: Former Smoker    Types: Cigarettes    Start date: 01/07/1975    Last attempt to quit: 2005    Years since quitting: 14.1  . Smokeless tobacco: Never Used  Substance and Sexual Activity  . Alcohol use: No    Alcohol/week: 0.0 oz  . Drug use: No  . Sexual activity: Yes    Birth control/protection: Post-menopausal, Surgical     Comment: tubal  Other Topics Concern  . None  Social History Narrative  . None    Review of Systems: Complete ROS negative except as per HPI.   Physical Exam: BP 120/86   Pulse 91   Temp (!) 97.4 F (36.3 C) (Oral)   Ht 5\' 7"  (1.702 m)   Wt 240 lb 12.8 oz (109.2 kg)   BMI 37.71 kg/m  General:   Obese female. Alert and oriented. Pleasant and cooperative. Well-nourished and well-developed.  Eyes:  Without icterus, sclera clear and conjunctiva pink.  Ears:  Normal auditory acuity. Cardiovascular:  S1, S2 present without murmurs appreciated. Noted occasional irregular beats. Extremities without clubbing or edema. Respiratory:  Clear to auscultation bilaterally. No wheezes, rales, or rhonchi. No distress.  Gastrointestinal:  +BS, rounded but soft, non-tender and non-distended. No HSM noted. No guarding or rebound. No masses appreciated.  Rectal:  Deferred  Musculoskalatal:  Symmetrical without gross deformities. Neurologic:  Alert and oriented x4;  grossly normal neurologically. Psych:  Alert and cooperative. Normal mood and affect.    02/18/2017 10:03 AM   Disclaimer: This note was dictated with voice recognition software. Similar sounding words can inadvertently be transcribed and may not be corrected upon review.

## 2017-02-18 NOTE — Assessment & Plan Note (Signed)
History of chronic GERD symptoms which are much improved on Dexilant daily as well as ranitidine 150 mg.  Only one episode of breakthrough GERD in the past 3 months.  Recommend she continue her current medications.  Return for follow-up in 6 months.

## 2017-02-19 DIAGNOSIS — G4733 Obstructive sleep apnea (adult) (pediatric): Secondary | ICD-10-CM | POA: Diagnosis not present

## 2017-02-25 ENCOUNTER — Telehealth: Payer: Self-pay | Admitting: Gastroenterology

## 2017-02-25 MED ORDER — LINACLOTIDE 72 MCG PO CAPS: 72.0000 ug | ORAL_CAPSULE | Freq: Every day | ORAL | 3 refills | Status: DC

## 2017-02-25 NOTE — Addendum Note (Signed)
Addended by: Mahala Menghini on: 02/25/2017 11:39 PM   Modules accepted: Orders

## 2017-02-25 NOTE — Telephone Encounter (Signed)
Pt asked for Becky [name] to send her LInzess prescription to [facility] because they would honor the coupon and she could get 90 day supply for $30 whereas [facility] could only do 30 days for $30.

## 2017-02-25 NOTE — Telephone Encounter (Signed)
Forwarding to refill box.  

## 2017-02-27 ENCOUNTER — Ambulatory Visit: Payer: BLUE CROSS/BLUE SHIELD | Admitting: Urology

## 2017-03-10 ENCOUNTER — Telehealth: Payer: Self-pay | Admitting: Gastroenterology

## 2017-03-10 NOTE — Telephone Encounter (Signed)
Pt was asking if she could get samples of Linzess. Please call [phone]

## 2017-03-10 NOTE — Telephone Encounter (Signed)
Pt is aware I have her samples of Linzess 72 mcg at front for pick up, #12.

## 2017-03-11 NOTE — Telephone Encounter (Signed)
Noted  

## 2017-03-12 DIAGNOSIS — I10 Essential (primary) hypertension: Secondary | ICD-10-CM | POA: Diagnosis not present

## 2017-03-12 DIAGNOSIS — E782 Mixed hyperlipidemia: Secondary | ICD-10-CM | POA: Diagnosis not present

## 2017-03-12 DIAGNOSIS — E1140 Type 2 diabetes mellitus with diabetic neuropathy, unspecified: Secondary | ICD-10-CM | POA: Diagnosis not present

## 2017-03-12 DIAGNOSIS — H6591 Unspecified nonsuppurative otitis media, right ear: Secondary | ICD-10-CM | POA: Diagnosis not present

## 2017-03-12 DIAGNOSIS — J019 Acute sinusitis, unspecified: Secondary | ICD-10-CM | POA: Diagnosis not present

## 2017-03-12 DIAGNOSIS — Z6836 Body mass index (BMI) 36.0-36.9, adult: Secondary | ICD-10-CM | POA: Diagnosis not present

## 2017-03-12 DIAGNOSIS — E6609 Other obesity due to excess calories: Secondary | ICD-10-CM | POA: Diagnosis not present

## 2017-03-12 DIAGNOSIS — Z1389 Encounter for screening for other disorder: Secondary | ICD-10-CM | POA: Diagnosis not present

## 2017-03-12 DIAGNOSIS — E063 Autoimmune thyroiditis: Secondary | ICD-10-CM | POA: Diagnosis not present

## 2017-03-19 DIAGNOSIS — G4733 Obstructive sleep apnea (adult) (pediatric): Secondary | ICD-10-CM | POA: Diagnosis not present

## 2017-03-30 ENCOUNTER — Other Ambulatory Visit: Payer: Self-pay | Admitting: Cardiovascular Disease

## 2017-04-07 ENCOUNTER — Ambulatory Visit: Payer: BLUE CROSS/BLUE SHIELD | Admitting: Urology

## 2017-05-04 DIAGNOSIS — J019 Acute sinusitis, unspecified: Secondary | ICD-10-CM | POA: Diagnosis not present

## 2017-05-04 DIAGNOSIS — M2550 Pain in unspecified joint: Secondary | ICD-10-CM | POA: Diagnosis not present

## 2017-05-04 DIAGNOSIS — E6609 Other obesity due to excess calories: Secondary | ICD-10-CM | POA: Diagnosis not present

## 2017-05-04 DIAGNOSIS — Z6836 Body mass index (BMI) 36.0-36.9, adult: Secondary | ICD-10-CM | POA: Diagnosis not present

## 2017-05-21 IMAGING — CR DG CHEST 2V
3 series · 3 of 3 positions shown · non-contrast
Comparison: None.

CLINICAL DATA: Shortness of breath

EXAM:
CHEST  2 VIEW

[PA (1 of 2)]
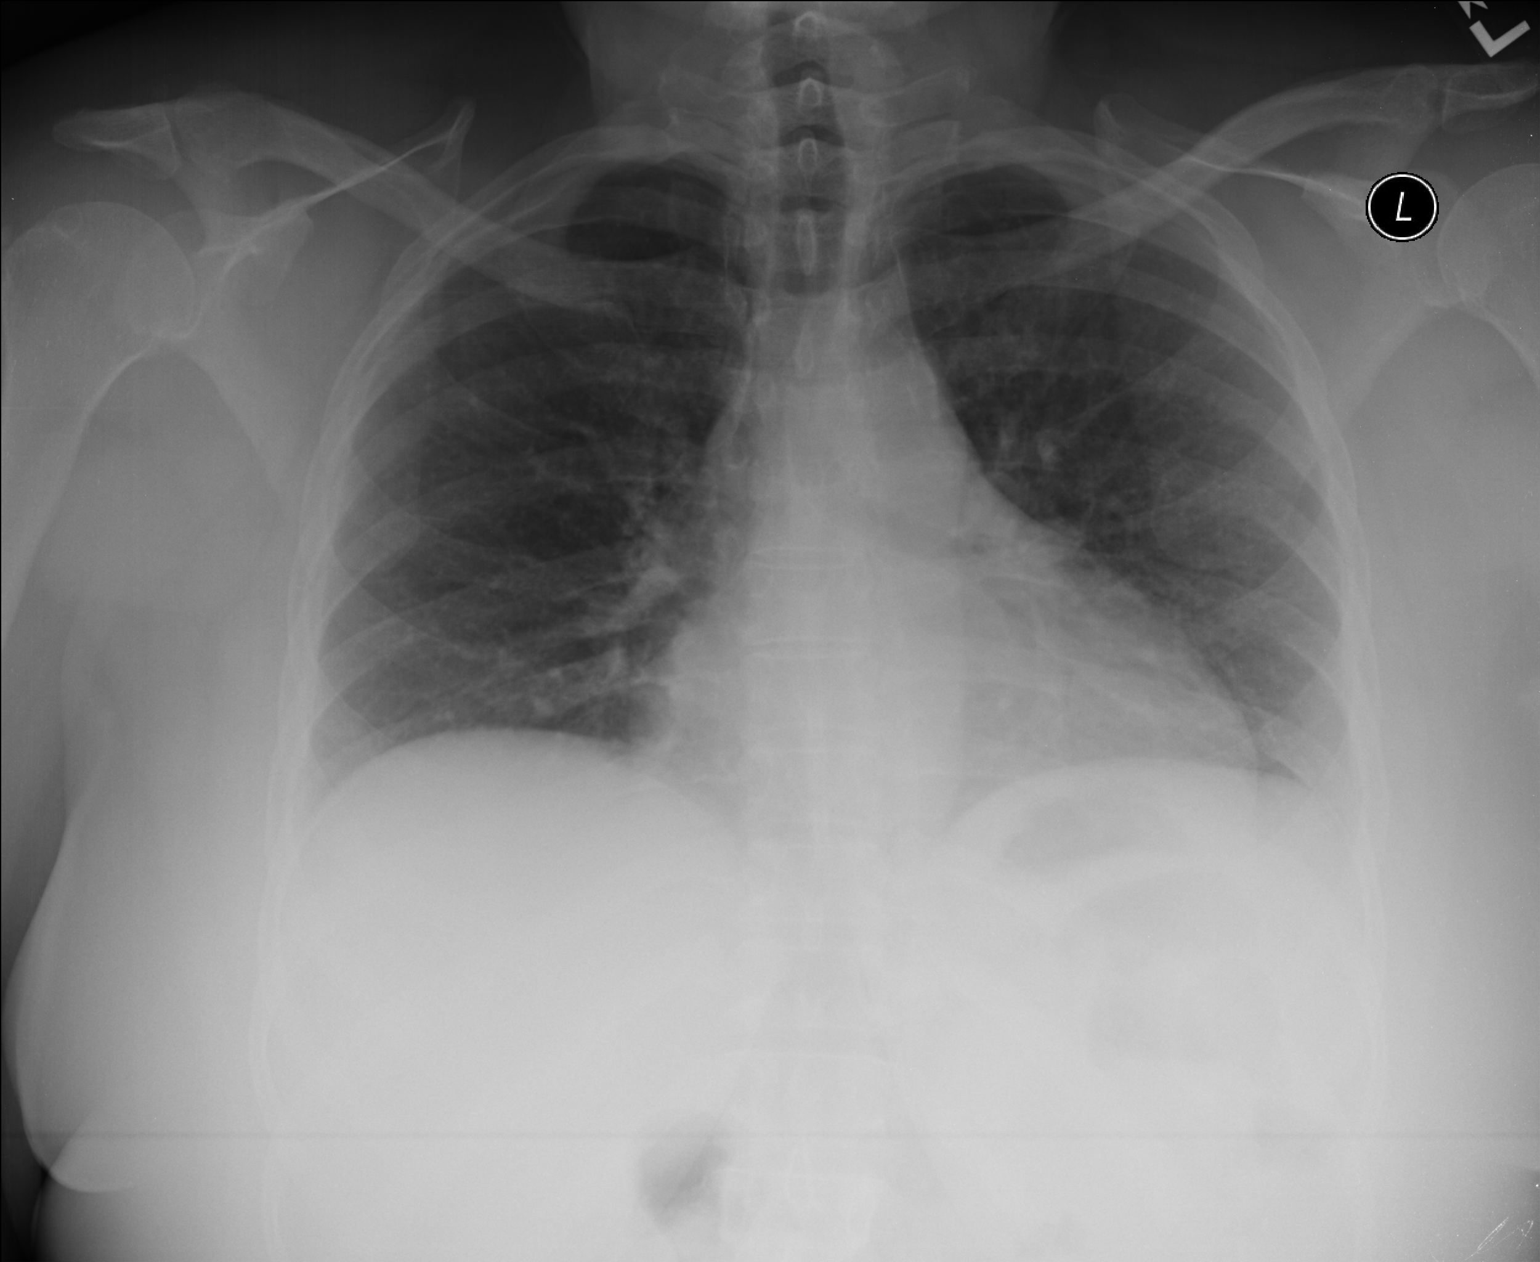

[lateral]
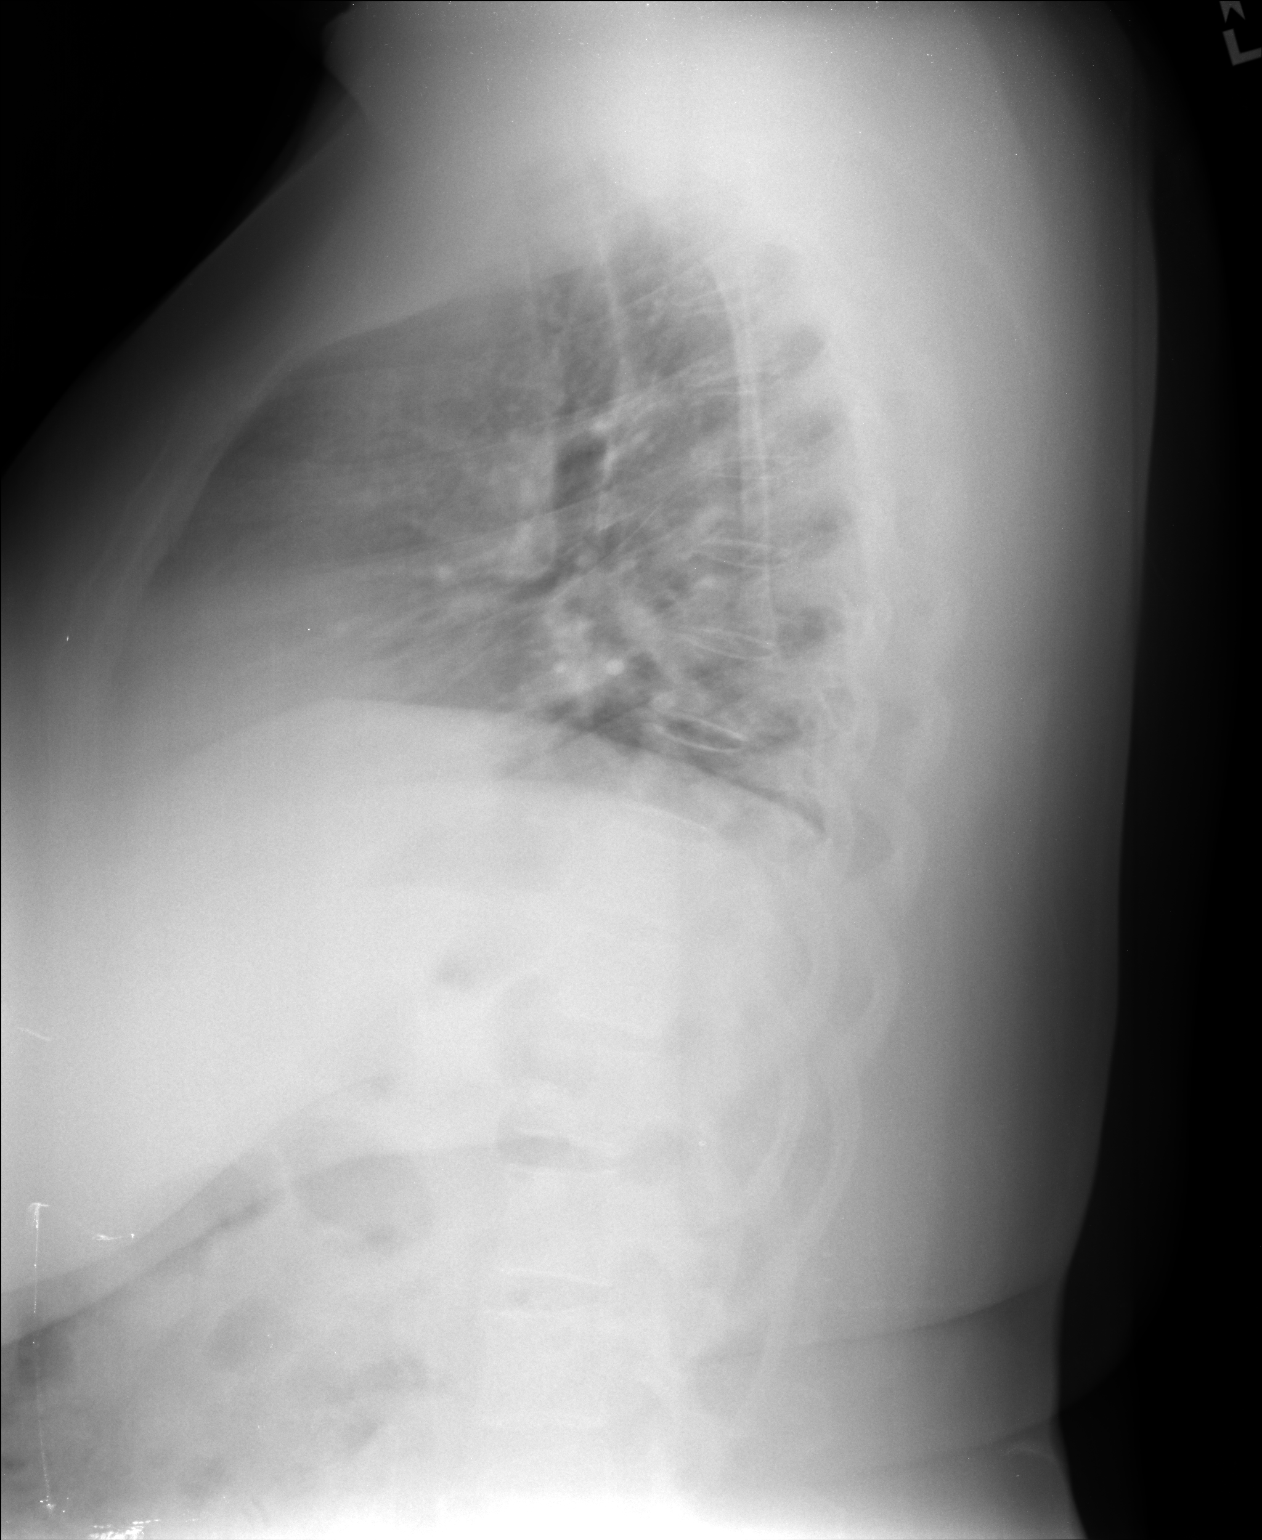

[PA (2 of 2)]
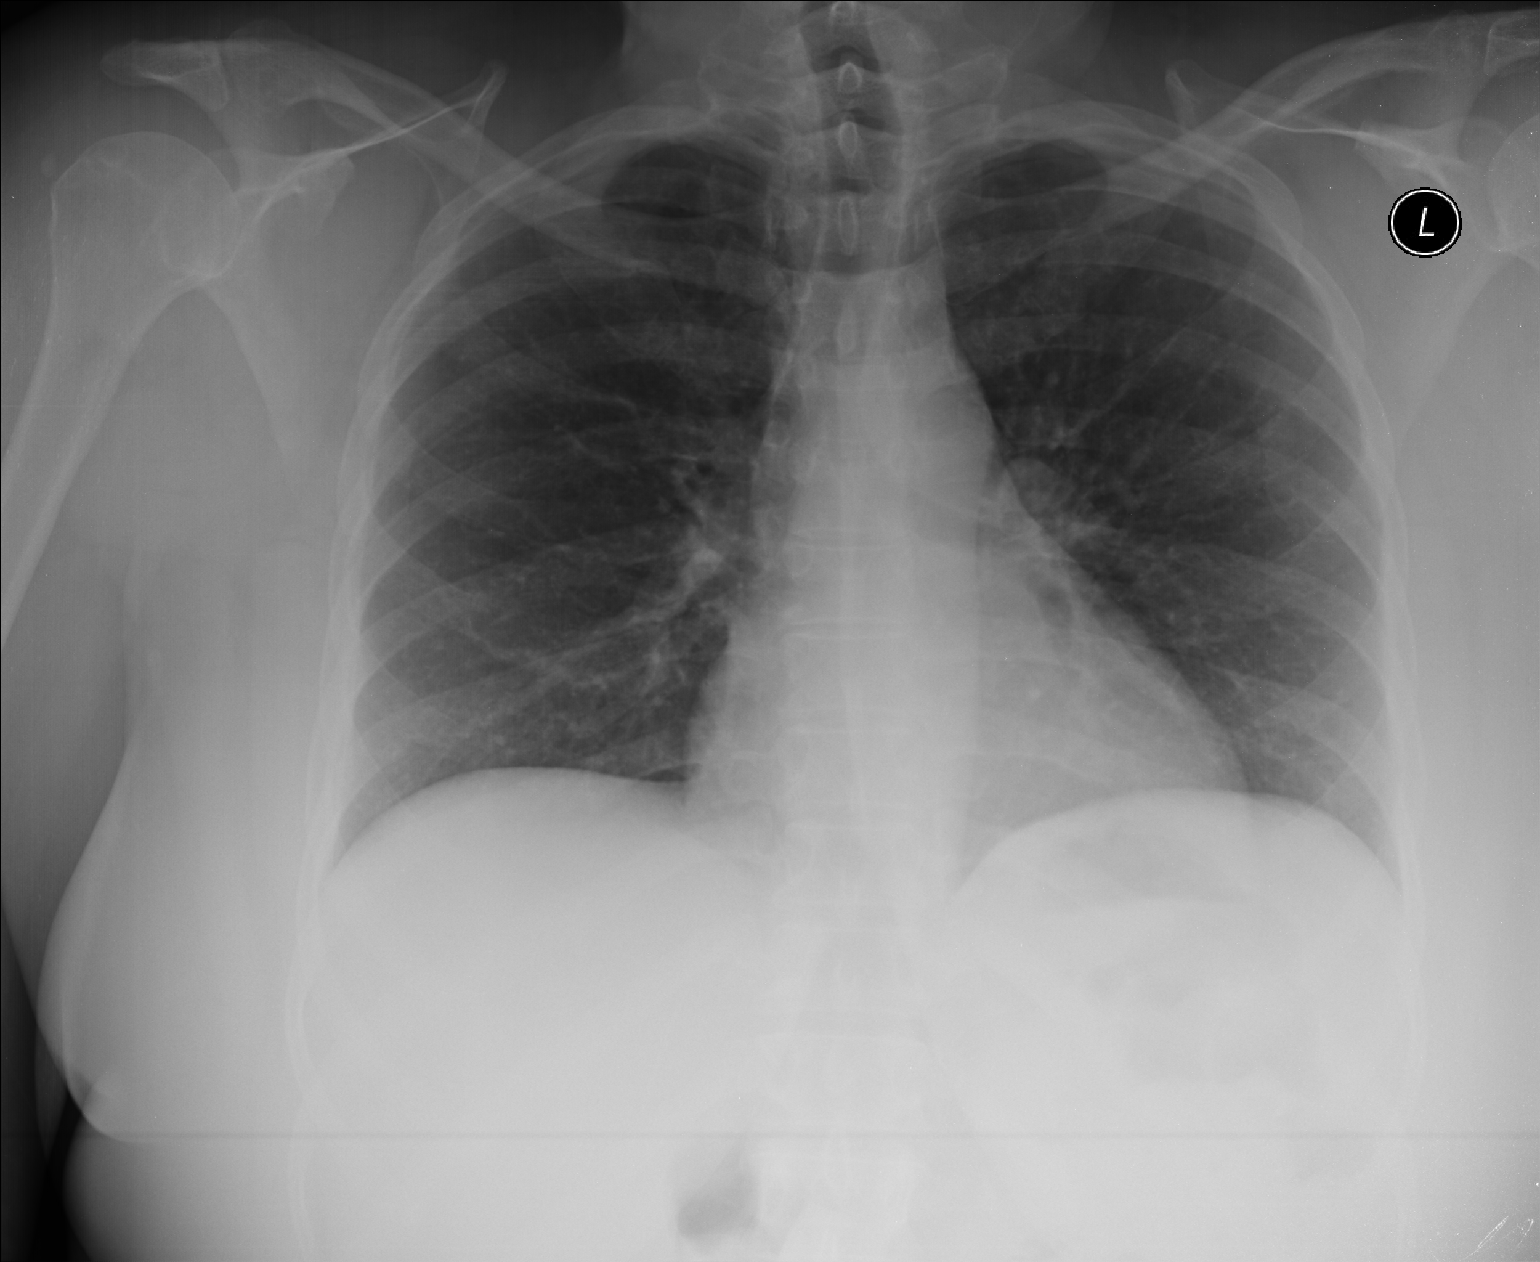

[3 of 3 positions shown; findings below may reference images not displayed]

FINDINGS: The heart size and mediastinal contours are within normal limits.
Both lungs are clear. The visualized skeletal structures are
unremarkable.
IMPRESSION: No active cardiopulmonary disease.

## 2017-05-27 ENCOUNTER — Telehealth: Payer: Self-pay | Admitting: Cardiovascular Disease

## 2017-05-27 DIAGNOSIS — R252 Cramp and spasm: Secondary | ICD-10-CM

## 2017-05-27 NOTE — Telephone Encounter (Signed)
Sx's over several day, please advise   Pt of Dr Bronson Ing

## 2017-05-27 NOTE — Telephone Encounter (Signed)
Patient has c/o cramiping in legs and toes. / tg

## 2017-05-27 NOTE — Telephone Encounter (Signed)
Patient of Dr. Bronson Ing.  He is currently out of the office.  Message forwarded to my inbox for review.  Very little information provided.  I reviewed the last office visit.  Not entirely clear that leg cramping and toe cramping would be related to her current cardiovascular status, but she is on Cozaar and Aldactone.  Would recommend checking BMET and magnesium level.  Renal function and potassium were normal as of December 2018.

## 2017-05-28 ENCOUNTER — Other Ambulatory Visit (HOSPITAL_COMMUNITY)
Admission: RE | Admit: 2017-05-28 | Discharge: 2017-05-28 | Disposition: A | Discharge status: Home / Self Care | Payer: BLUE CROSS/BLUE SHIELD | Source: Ambulatory Visit | Attending: Cardiology | Admitting: Cardiology

## 2017-05-28 DIAGNOSIS — R252 Cramp and spasm: Secondary | ICD-10-CM | POA: Diagnosis not present

## 2017-05-28 LAB — BASIC METABOLIC PANEL
Anion gap: 7 (ref 5–15)
BUN: 14 mg/dL (ref 6–20)
CO2: 27 mmol/L (ref 22–32)
Calcium: 9.6 mg/dL (ref 8.9–10.3)
Chloride: 104 mmol/L (ref 101–111)
Creatinine, Ser: 0.8 mg/dL (ref 0.44–1.00)
GFR calc Af Amer: >60 mL/min (ref >60)
GFR calc non Af Amer: >60 mL/min (ref >60)
Glucose, Bld: 103 mg/dL — ABNORMAL HIGH (ref 65–99)
Potassium: 3.8 mmol/L (ref 3.5–5.1)
Sodium: 138 mmol/L (ref 135–145)

## 2017-05-28 LAB — MAGNESIUM: Magnesium: 2 mg/dL (ref 1.7–2.4)

## 2017-05-28 NOTE — Telephone Encounter (Signed)
Spoke with pt. She will come to have labs drawn today. Orders placed.

## 2017-05-28 NOTE — Addendum Note (Signed)
Addended by: Debbora Lacrosse R on: 05/28/2017 10:13 AM   Modules accepted: Orders

## 2017-05-28 NOTE — Telephone Encounter (Signed)
Attempt to reach, [id number] 

## 2017-05-29 ENCOUNTER — Telehealth: Payer: Self-pay | Admitting: Cardiovascular Disease

## 2017-05-29 NOTE — Telephone Encounter (Signed)
Called wanting lab results °

## 2017-05-29 NOTE — Telephone Encounter (Signed)
Notes recorded by Satira Sark, MD on 05/28/2017 at 8:03 PM EDT Potassium, magnesium, and renal function are normal on current regimen. May want to discuss recent leg and toe cramping with PCP next.  Pt aware and voiced understanding - routed results to pcp

## 2017-06-03 DIAGNOSIS — Z6836 Body mass index (BMI) 36.0-36.9, adult: Secondary | ICD-10-CM | POA: Diagnosis not present

## 2017-06-03 DIAGNOSIS — M25531 Pain in right wrist: Secondary | ICD-10-CM | POA: Diagnosis not present

## 2017-06-03 DIAGNOSIS — Z1389 Encounter for screening for other disorder: Secondary | ICD-10-CM | POA: Diagnosis not present

## 2017-06-03 DIAGNOSIS — E6609 Other obesity due to excess calories: Secondary | ICD-10-CM | POA: Diagnosis not present

## 2017-06-03 DIAGNOSIS — H6691 Otitis media, unspecified, right ear: Secondary | ICD-10-CM | POA: Diagnosis not present

## 2017-07-20 ENCOUNTER — Encounter: Payer: Self-pay | Admitting: Cardiovascular Disease

## 2017-07-20 ENCOUNTER — Ambulatory Visit (INDEPENDENT_AMBULATORY_CARE_PROVIDER_SITE_OTHER): Payer: BLUE CROSS/BLUE SHIELD | Admitting: Cardiovascular Disease

## 2017-07-20 VITALS — BP 132/74 | HR 68 | Ht 67.0 in | Wt 247.0 lb

## 2017-07-20 DIAGNOSIS — I480 Paroxysmal atrial fibrillation: Secondary | ICD-10-CM

## 2017-07-20 DIAGNOSIS — E782 Mixed hyperlipidemia: Secondary | ICD-10-CM

## 2017-07-20 DIAGNOSIS — I119 Hypertensive heart disease without heart failure: Secondary | ICD-10-CM | POA: Diagnosis not present

## 2017-07-20 DIAGNOSIS — I10 Essential (primary) hypertension: Secondary | ICD-10-CM | POA: Diagnosis not present

## 2017-07-20 DIAGNOSIS — R9439 Abnormal result of other cardiovascular function study: Secondary | ICD-10-CM | POA: Diagnosis not present

## 2017-07-20 NOTE — Progress Notes (Signed)
SUBJECTIVE: The patient presents for routine follow-up of paroxysmal atrial fibrillation and hypertensive heart disease. She was hospitalized for rapid atrial fibrillation in November 2018 and was initiated on diltiazem and Xarelto. She underwent a low risk nuclear stress test in May 2017 which showed a small mild apical infarct with mild peri-infarct ischemia.  Echocardiogram 11/14/16: Normal left ventricular systolic function with mild LVH, LVEF 67-34%, grade 2 diastolic dysfunction with elevated filling pressures, mild left atrial enlargement.  She is feeling well overall and denies chest pain, exertional dyspnea, palpitations, leg swelling.  She is tolerating Crestor.  I reviewed labs dated 06/12/2017: Sodium 141, potassium 3.8, BUN 12, creatinine 0.78, normal TSH, AST 12, ALT 16, hemoglobin 12.2, platelets 341, hemoglobin A1c 6.4%, vitamin D 35.1, magnesium 2.1.  Lipids: Total cholesterol 186, triglycerides 79, HDL 71, LDL 99.    Review of Systems: As per "subjective", otherwise negative.  Allergies  Allergen Reactions  . Hydralazine Nausea Only  . Prednisone Swelling    Current Outpatient Medications  Medication Sig Dispense Refill  . acetaminophen (TYLENOL) 500 MG tablet Take 500 mg by mouth every 6 (six) hours as needed for moderate pain or headache.     [name] THYROID 90 MG tablet Take 90 mg by mouth daily.   4  . budesonide (RHINOCORT AQUA) 32 MCG/ACT nasal spray Place 1 spray into both nostrils daily as needed for rhinitis.    . Cholecalciferol (VITAMIN D3) 5000 units CAPS Take 2 capsules by mouth daily as needed (for Vitamin D defficiency).    . DEXILANT 60 MG capsule TAKE ONE (1) CAPSULE EACH DAY 30 capsule 5  . diltiazem (CARDIZEM CD) 120 MG 24 hr capsule Take 1 capsule (120 mg total) daily by mouth. 90 capsule 3  . Glycerin-Polysorbate 80 (REFRESH DRY EYE THERAPY OP) Apply 1 drop to eye daily as needed (dry eyes).    Marland Kitchen linaclotide (LINZESS) 72 MCG capsule Take  1 capsule (72 mcg total) by mouth daily before breakfast. 90 capsule 3  . losartan (COZAAR) 100 MG tablet Take 100 mg by mouth daily.    . montelukast (SINGULAIR) 10 MG tablet Take 1 tablet by mouth daily.    . ondansetron (ZOFRAN) 4 MG tablet Take 1 tablet (4 mg total) every 8 (eight) hours as needed by mouth for nausea or vomiting. 20 tablet 2  . ranitidine (ZANTAC) 150 MG capsule Take 1 capsule (150 mg total) by mouth every evening. 30 capsule 5  . rivaroxaban (XARELTO) 20 MG TABS tablet Take 1 tablet (20 mg total) daily with supper by mouth. 90 tablet 3  . rosuvastatin (CRESTOR) 5 MG tablet Take 1 tablet (5 mg total) by mouth daily. 90 tablet 3  . spironolactone (ALDACTONE) 25 MG tablet Take 1 tablet (25 mg total) by mouth daily. 90 tablet 3  . spironolactone (ALDACTONE) 25 MG tablet TAKE ONE (1) TABLET BY MOUTH EVERY DAY 90 tablet 3   No current facility-administered medications for this visit.     Past Medical History:  Diagnosis Date  . Back pain   . Body aches 11/21/2014  . BV (bacterial vaginosis) 05/23/2013  . Constipation 11/21/2014  . Elevated cholesterol 11/02/2013  . Fibroids 03/13/2016  . Hematuria 05/23/2013  . Hyperlipidemia   . Hypertension   . Migraines   . PAF (paroxysmal atrial fibrillation) ([place])    a. diagnosed in 11/2016 --> started on Xarelto for anticoagulation  . Pelvic pain in female 11/02/2013  . Plantar fasciitis of  right foot   . Thyroid disease   . Vaginal discharge 03/24/2014  . Vaginal irritation 05/23/2013  . Vaginal itching 03/24/2014    Past Surgical History:  Procedure Laterality Date  . ECTOPIC PREGNANCY SURGERY    . ESOPHAGOGASTRODUODENOSCOPY  12/19/2009   YHC:WCBJSE stricture s/p dilation/mild gastritis  . ESOPHAGOGASTRODUODENOSCOPY N/A 12/10/2015   Procedure: ESOPHAGOGASTRODUODENOSCOPY (EGD);  Surgeon: Danie Binder, MD;  [facility];  Service: Endoscopy;  Laterality: N/A;  830  . ileocolonoscopy  12/19/2009   GBT:[id number]  polyps/mild left-side diverticulosis/hemorrhoids  . KNEE SURGERY    . TUBAL LIGATION      Social History   Socioeconomic History  . Marital status: Married    Spouse name: Not on file  . Number of children: Not on file  . Years of education: Not on file  . Highest education level: Not on file  Occupational History  . Not on file  Social Needs  . Financial resource strain: Not on file  . Food insecurity:    Worry: Not on file    Inability: Not on file  . Transportation needs:    Medical: Not on file    Non-medical: Not on file  Tobacco Use  . Smoking status: Former Smoker    Types: Cigarettes    Start date: 01/07/1975    Last attempt to quit: 2005    Years since quitting: 14.5  . Smokeless tobacco: Never Used  Substance and Sexual Activity  . Alcohol use: No    Alcohol/week: 0.0 oz  . Drug use: No  . Sexual activity: Yes    Birth control/protection: Post-menopausal, Surgical    Comment: tubal  Lifestyle  . Physical activity:    Days per week: Not on file    Minutes per session: Not on file  . Stress: Not on file  Relationships  . Social connections:    Talks on phone: Not on file    Gets together: Not on file    Attends religious service: Not on file    Active member of club or organization: Not on file    Attends meetings of clubs or organizations: Not on file    Relationship status: Not on file  . Intimate partner violence:    Fear of current or ex partner: Not on file    Emotionally abused: Not on file    Physically abused: Not on file    Forced sexual activity: Not on file  Other Topics Concern  . Not on file  Social History Narrative  . Not on file     Vitals:   07/20/17 0926  BP: 132/74  Pulse: 68  SpO2: 93%  Weight: 247 lb (112 kg)  Height: 5\' 7"  (1.702 m)    Wt Readings from Last 3 Encounters:  07/20/17 247 lb (112 kg)  02/18/17 240 lb 12.8 oz (109.2 kg)  01/12/17 247 lb (112 kg)     PHYSICAL EXAM General: NAD HEENT: Normal. Neck:  No JVD, no thyromegaly. Lungs: Clear to auscultation bilaterally with normal respiratory effort. CV: Regular rate and rhythm, normal S1/S2, no S3/S4, no murmur. No pretibial or periankle edema.  No carotid bruit.   Abdomen: Soft, nontender, no distention.  Neurologic: Alert and oriented.  Psych: Normal affect. Skin: Normal. Musculoskeletal: No gross deformities.    ECG: Reviewed above under Subjective   Labs: Lab Results  Component Value Date/Time   K 3.8 05/28/2017 12:10 PM   BUN 14 05/28/2017 12:10 PM   BUN 11  01/01/2017 11:39 AM   CREATININE 0.80 05/28/2017 12:10 PM   CREATININE 0.77 08/01/2015 12:29 PM   ALT 22 01/01/2017 11:39 AM   TSH 1.680 01/01/2017 11:39 AM   HGB 12.6 01/01/2017 11:39 AM     Lipids: Lab Results  Component Value Date/Time   LDLCALC 154 (H) 01/01/2017 11:39 AM   CHOL 231 (H) 01/01/2017 11:39 AM   TRIG 100 01/01/2017 11:39 AM   HDL 57 01/01/2017 11:39 AM       ASSESSMENT AND PLAN: 1.  Paroxysmal atrial fibrillation: Symptomatically stable on long-acting diltiazem.  Anticoagulated with Xarelto.  Mild left atrial enlargement.  No changes to therapy.  2.  Hypertensive heart disease: Most recent echocardiogram from November 2018 reviewed above.  She is euvolemic.  Blood pressure is normal today.  No changes to therapy.  3.  Abnormal nuclear stress test: Stress test results detailed above from 2017.  Symptomatically stable.  No changes to therapy.  4.  Hypertension: Blood pressure is normal today.  No changes to therapy.  5.  Hyperlipidemia:  Lipids on 01/01/17 showed total cholesterol elevated at 231, triglycerides 100, HDL 57, LDL 154.  She developed myalgias with Lipitor and quit taking it.  I started Crestor 5 mg at her last visit.  Most recent lipids from early June 2019 reviewed above which demonstrated significant improvement.  No changes to therapy.     Disposition: Follow up 6 months   Kate Sable, M.D., F.A.C.C.

## 2017-07-20 NOTE — Patient Instructions (Signed)
Medication Instructions:  Your physician recommends that you continue on your current medications as directed. Please refer to the Current Medication list given to you today.   Labwork: NONE   Testing/Procedures: NONE   Follow-Up: Your physician wants you to follow-up in: 6 Months with Dr. Koneswaran. You will receive a reminder letter in the mail two months in advance. If you don't receive a letter, please call our office to schedule the follow-up appointment.   Any Other Special Instructions Will Be Listed Below (If Applicable).     If you need a refill on your cardiac medications before your next appointment, please call your pharmacy.  Thank you for choosing [facility]!   

## 2017-07-23 DIAGNOSIS — M79641 Pain in right hand: Secondary | ICD-10-CM | POA: Diagnosis not present

## 2017-07-23 DIAGNOSIS — G5602 Carpal tunnel syndrome, left upper limb: Secondary | ICD-10-CM | POA: Diagnosis not present

## 2017-07-23 DIAGNOSIS — G5601 Carpal tunnel syndrome, right upper limb: Secondary | ICD-10-CM | POA: Diagnosis not present

## 2017-07-23 DIAGNOSIS — M25522 Pain in left elbow: Secondary | ICD-10-CM | POA: Diagnosis not present

## 2017-08-03 DIAGNOSIS — M7542 Impingement syndrome of left shoulder: Secondary | ICD-10-CM | POA: Diagnosis not present

## 2017-08-03 DIAGNOSIS — M7541 Impingement syndrome of right shoulder: Secondary | ICD-10-CM | POA: Diagnosis not present

## 2017-08-03 DIAGNOSIS — M25511 Pain in right shoulder: Secondary | ICD-10-CM | POA: Diagnosis not present

## 2017-08-05 ENCOUNTER — Encounter (HOSPITAL_COMMUNITY): Payer: Self-pay

## 2017-08-05 ENCOUNTER — Ambulatory Visit (HOSPITAL_COMMUNITY): Payer: BLUE CROSS/BLUE SHIELD | Attending: Orthopedic Surgery

## 2017-08-05 ENCOUNTER — Other Ambulatory Visit: Payer: Self-pay

## 2017-08-05 DIAGNOSIS — R29898 Other symptoms and signs involving the musculoskeletal system: Secondary | ICD-10-CM

## 2017-08-05 DIAGNOSIS — M25522 Pain in left elbow: Secondary | ICD-10-CM

## 2017-08-05 NOTE — Therapy (Addendum)
[name], [place], [address] Phone: [phone]   Fax:  [phone]  Occupational Therapy Evaluation  Patient Details  Name: Becky [name] MRN: [id number] Date of Birth: 1960/02/23 Referring Provider: [name], MD   Encounter Date: 08/05/2017  OT End of Session - 08/05/17 1245    Visit Number  1    Number of Visits  5    Date for OT Re-Evaluation  09/02/17    Authorization Type  Primary is [employer] - $25 copay and visits based on medical necessity; secondary is also [place] may be covered    Authorization Time Period  visits based on medical necessity    OT Start Time  1001 patient arrived late    OT Stop Time  1032    OT Time Calculation (min)  31 min    Activity Tolerance  Patient tolerated treatment well    Behavior During Therapy  [facility] for tasks assessed/performed       Past Medical History:  Diagnosis Date  . Back pain   . Body aches 11/21/2014  . BV (bacterial vaginosis) 05/23/2013  . Constipation 11/21/2014  . Elevated cholesterol 11/02/2013  . Fibroids 03/13/2016  . Hematuria 05/23/2013  . Hyperlipidemia   . Hypertension   . Migraines   . PAF (paroxysmal atrial fibrillation) ([place])    a. diagnosed in 11/2016 --> started on Xarelto for anticoagulation  . Pelvic pain in female 11/02/2013  . Plantar fasciitis of right foot   . Thyroid disease   . Vaginal discharge 03/24/2014  . Vaginal irritation 05/23/2013  . Vaginal itching 03/24/2014    Past Surgical History:  Procedure Laterality Date  . ECTOPIC PREGNANCY SURGERY    . ESOPHAGOGASTRODUODENOSCOPY  12/19/2009   IOE:VOJJKK stricture s/p dilation/mild gastritis  . ESOPHAGOGASTRODUODENOSCOPY N/A 12/10/2015   Procedure: ESOPHAGOGASTRODUODENOSCOPY (EGD);  Surgeon: Danie Binder, MD;  [facility];  Service: Endoscopy;  Laterality: N/A;  830  . ileocolonoscopy  12/19/2009   XFG:[id number] polyps/mild left-side  diverticulosis/hemorrhoids  . KNEE SURGERY    . TUBAL LIGATION      There were no vitals filed for this visit.  Subjective Assessment - 08/05/17 1002    Subjective   S: Sometimes the hands will be numb first thing in the morning but it goes away the more I start using it.    Pertinent History  Patient is a 57 y/o female presenting with lateral epicondylitis. Patient reports symptoms began a little over a month ago. Since then she has had swelling in the distal forearm as well as pain in lower arm, elbow, and upper arm. She also reports pain in bilateral shoulders and neck. Patient reports both shoulders were injected about a week ago. Patient experiences numbness in the mornings that goes away with activity. [name] has reffered patient for evaluation and treatment.     Patient Stated Goals  To reduce pain.    Currently in Pain?  Yes    Pain Score  5     Pain Location  Arm lower    Pain Orientation  Left    Pain Descriptors / Indicators  Throbbing    Pain Type  Acute pain    Pain Radiating Towards  LUE    Pain Onset  More than a month ago    Pain Frequency  Constant    Aggravating Factors   movement; lifting with palm up    Pain Relieving Factors  ice, heat  Effect of Pain on Daily Activities  moderate effect on ADLs    Multiple Pain Sites  No        [facility] OT Assessment - 08/05/17 0950      Assessment   Medical Diagnosis  left elbow pain    Referring Provider  [name], MD    Onset Date/Surgical Date  -- a little over a month ago    Hand Dominance  Right    Next MD Visit  -- approximately 3 weeks from now    Prior Therapy  none      Precautions   Precautions  None      Restrictions   Weight Bearing Restrictions  No      Balance Screen   Has the patient fallen in the past 6 months  Yes    How many times?  1    Has the patient had a decrease in activity level because of a fear of falling?   No    Is the patient reluctant to leave their home because of a fear of  falling?   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    Leisure  Patient enjoys relaxing and watching movies at home when not working.      ADL   ADL comments  Patient reports she can do most things. She has difficulty lifting but often compensates. At work she finds help from coworkers. Some difficulty reported with reaching up to unfasten bra.      Mobility   Mobility Status  Independent      Vision - History   Baseline Vision  Wears glasses all the time      Cognition   Overall Cognitive Status  Within Functional Limits for tasks assessed      Coordination   9 Hole Peg Test  Right;Left    Right 9 Hole Peg Test  24.81"    Left 9 Hole Peg Test  30.44"      ROM / Strength   AROM / PROM / Strength  AROM;PROM;Strength      Palpation   Palpation comment  moderate fascial restrictions palpated in forearm.      AROM   Overall AROM   Within functional limits for tasks performed    AROM Assessment Site  Shoulder;Elbow;Wrist;Forearm hand      PROM   Overall PROM   Within functional limits for tasks performed      Strength   Overall Strength Comments  assessed seated    Strength Assessment Site  Elbow;Wrist    Right/Left Elbow  Left    Left Elbow Flexion  4+/5    Left Elbow Extension  4+/5    Right/Left Wrist  Left    Left Wrist Flexion  4+/5    Left Wrist Extension  4+/5    Left Wrist Radial Deviation  4/5    Left Wrist Ulnar Deviation  4/5      Hand Function   Right Hand Grip (lbs)  78    Right Hand Lateral  Pinch  19 lbs    Right Hand 3 Point Pinch  10 lbs    Left Hand Grip (lbs)  47    Left Hand Lateral Pinch   12 lbs    Left 3 point pinch  11 lbs                      OT Education - 08/05/17 1239    Education Details  Patient provided with resources on purchasing a compression garment for forearm in order to address swelling.    Person(s) Educated  Patient    Methods  Explanation;Handout    Comprehension  Verbalized understanding       OT Short Term Goals - 08/05/17 1403      OT SHORT TERM GOAL #1   Title  Patient will decrease facsial restrictions in left forearm in order to decrease pain and increase functional use of the left arm during daily and work activities.    Time  4    Period  Weeks    Status  New    Target Date  09/02/17      OT SHORT TERM GOAL #2   Title  Patient will reduce pain to a 3/10 or less in the LUE while completing daily tasks.    Time  4    Period  Weeks    Status  New      OT SHORT TERM GOAL #3   Title  Patient will increase left elbow and wrist strength to 5/5 in order to be able to complete lifting tasks at work with less difficulty.    Time  4    Period  Weeks    Status  New               Plan - 08/05/17 1248    Clinical Impression Statement  A: Patient is a 57 y/o female presenting with left lateral epicondylitis causing increased pain and fascial restrictions in the forearm which is impacting use of the LUE. Patient is demonstrating good wrist and elbow strength as well as grip and pinch strength. Patient presenting with swelling in the distal forearm and reporting occasional numbness in the hands in the mornings. Patient to try skilled OT services 1X a week for 4 weeks in order to address pain and fascial restrictions in LUE. Patient also reporting bilateral shoulder and neck pain for which she will get a seperate order sent.     Occupational Profile and client history currently impacting functional performance  Patient was independent prior to injury and wants to be able to continue with lifting tasks at work.    Occupational  performance deficits (Please refer to evaluation for details):  Rest and Sleep;Work    Neurosurgeon    Current Impairments/barriers affecting progress:  comorbid conditions; hx of carpal tunnel in right hand    OT Frequency  1x / week    OT Duration  4 weeks    OT Treatment/Interventions  Self-care/ADL training;Moist Heat;Compression bandaging;Therapeutic activities;Ultrasound;Therapeutic exercise;Cryotherapy;Passive range of motion;Electrical Stimulation;Paraffin;Energy conservation;Manual Therapy;Patient/family education    Plan  P: Patient will benefit from skilled OT services to address pain and fascial restrictions in LUE. Treatment plan: manual techniques, modalities, edema management, and general elbow, wrist, and hand strengthening.    Clinical Decision Making  Limited treatment options, no task modification necessary    Consulted and Agree with Plan of Care  Patient       Patient will benefit from  skilled therapeutic intervention in order to improve the following deficits and impairments:  Increased edema, Increased fascial restrictions  Visit Diagnosis: Pain in left elbow  Other symptoms and signs involving the musculoskeletal system    Problem List Patient Active Problem List   Diagnosis Date Noted  . Nausea without vomiting 11/17/2016  . Current use of long term anticoagulation 11/12/2016  . Atrial fibrillation with RVR ([place]) 11/07/2016  . Coronary artery disease due to lipid rich plaque   . RLQ abdominal pain 10/03/2016  . Yeast infection 03/13/2016  . Fibroids 03/13/2016  . Screening for colorectal cancer 11/28/2015  . Burning with urination 11/28/2015  . Subacute frontal sinusitis 11/28/2015  . Leg cramps 10/31/2015  . Chest pain 10/31/2015  . Varicose 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 88/87/5797  . Body aches 11/21/2014  . Constipation 11/21/2014  . Vaginal itching 03/24/2014  . Vaginal discharge 03/24/2014  . Pelvic pain 11/02/2013   . Elevated cholesterol 11/02/2013  . Low back pain 10/20/2013  . Stiffness of ankle joint 10/20/2013  . Pain in joint, ankle and foot 10/20/2013  . Vaginal irritation 05/23/2013  . Hematuria 05/23/2013  . BV (bacterial vaginosis) 05/23/2013  . HEMATOCHEZIA 12/05/2009  . Dysphagia 12/05/2009  . CHEST WALL PAIN, ANTERIOR 06/23/2007  . LEG PAIN 05/18/2007  . VAGINAL PRURITUS 04/16/2007  . GOITER 03/10/2007  . HYPOTHYROIDISM 03/10/2007  . DIABETES MELLITUS, TYPE II 03/10/2007  . HYPERLIPIDEMIA 03/10/2007  . ANEMIA-IRON DEFICIENCY 03/10/2007  . Essential hypertension 03/10/2007  . RHINITIS, CHRONIC 03/10/2007  . ASTHMA, CHILDHOOD 03/10/2007  . GERD 03/10/2007  . PEPTIC ULCER DISEASE 03/10/2007  . MENOPAUSAL SYNDROME 03/10/2007    Roderic Palau, OT student 08/05/2017, 2:08 PM  [facility] [address] [place], [place], [address] Phone: [phone]   Fax:  [phone]  Name: Sonoma Firkus MRN: [id number] Date of Birth: 03/23/1960

## 2017-08-10 ENCOUNTER — Other Ambulatory Visit (HOSPITAL_COMMUNITY): Payer: Self-pay | Admitting: Family Medicine

## 2017-08-10 ENCOUNTER — Telehealth (HOSPITAL_COMMUNITY): Payer: Self-pay

## 2017-08-10 DIAGNOSIS — Z6836 Body mass index (BMI) 36.0-36.9, adult: Secondary | ICD-10-CM | POA: Diagnosis not present

## 2017-08-10 DIAGNOSIS — Z1389 Encounter for screening for other disorder: Secondary | ICD-10-CM | POA: Diagnosis not present

## 2017-08-10 DIAGNOSIS — R1031 Right lower quadrant pain: Secondary | ICD-10-CM | POA: Diagnosis not present

## 2017-08-10 DIAGNOSIS — R109 Unspecified abdominal pain: Secondary | ICD-10-CM

## 2017-08-10 DIAGNOSIS — E6609 Other obesity due to excess calories: Secondary | ICD-10-CM | POA: Diagnosis not present

## 2017-08-10 NOTE — Telephone Encounter (Signed)
PT eval needs to be scheduled order Scanned in Media Tab - Sent request for PT and OT(Pain in both shoulders)  order to be signed  NF  Spoke with Candy at [name] office he is in agreement for pt to be eval & tx for PT and OT. NF 8/5

## 2017-08-11 ENCOUNTER — Ambulatory Visit (HOSPITAL_COMMUNITY): Payer: BLUE CROSS/BLUE SHIELD | Attending: Orthopedic Surgery

## 2017-08-11 ENCOUNTER — Encounter (HOSPITAL_COMMUNITY): Payer: Self-pay

## 2017-08-11 DIAGNOSIS — M25522 Pain in left elbow: Secondary | ICD-10-CM | POA: Diagnosis not present

## 2017-08-11 DIAGNOSIS — M542 Cervicalgia: Secondary | ICD-10-CM | POA: Insufficient documentation

## 2017-08-11 DIAGNOSIS — R293 Abnormal posture: Secondary | ICD-10-CM | POA: Diagnosis not present

## 2017-08-11 DIAGNOSIS — R29898 Other symptoms and signs involving the musculoskeletal system: Secondary | ICD-10-CM

## 2017-08-11 DIAGNOSIS — M6281 Muscle weakness (generalized): Secondary | ICD-10-CM | POA: Insufficient documentation

## 2017-08-11 NOTE — Therapy (Signed)
[place], [place], [address] Phone: [phone]   Fax:  [phone]  Occupational Therapy Treatment  Patient Details  Name: Becky [name] MRN: [id number] Date of Birth: 1960/10/11 Referring Provider: [name], MD   Encounter Date: 08/11/2017  OT End of Session - 08/11/17 1304    Visit Number  2    Number of Visits  5    Date for OT Re-Evaluation  09/02/17    Authorization Type  Primary is [employer] - $25 copay and visits based on medical necessity; secondary is also [place] may be covered    Authorization Time Period  visits based on medical necessity    OT Start Time  1123    OT Stop Time  1201    OT Time Calculation (min)  38 min    Activity Tolerance  Patient tolerated treatment well    Behavior During Therapy  [facility] for tasks assessed/performed       Past Medical History:  Diagnosis Date  . Back pain   . Body aches 11/21/2014  . BV (bacterial vaginosis) 05/23/2013  . Constipation 11/21/2014  . Elevated cholesterol 11/02/2013  . Fibroids 03/13/2016  . Hematuria 05/23/2013  . Hyperlipidemia   . Hypertension   . Migraines   . PAF (paroxysmal atrial fibrillation) ([place])    a. diagnosed in 11/2016 --> started on Xarelto for anticoagulation  . Pelvic pain in female 11/02/2013  . Plantar fasciitis of right foot   . Thyroid disease   . Vaginal discharge 03/24/2014  . Vaginal irritation 05/23/2013  . Vaginal itching 03/24/2014    Past Surgical History:  Procedure Laterality Date  . ECTOPIC PREGNANCY SURGERY    . ESOPHAGOGASTRODUODENOSCOPY  12/19/2009   IWP:[id number] stricture s/p dilation/mild gastritis  . ESOPHAGOGASTRODUODENOSCOPY N/A 12/10/2015   Procedure: ESOPHAGOGASTRODUODENOSCOPY (EGD);  Surgeon: Danie Binder, MD;  [facility];  Service: Endoscopy;  Laterality: N/A;  830  . ileocolonoscopy  12/19/2009   JAS:[id number] polyps/mild left-side diverticulosis/hemorrhoids  . KNEE  SURGERY    . TUBAL LIGATION      There were no vitals filed for this visit.  Subjective Assessment - 08/11/17 1308    Subjective   S: It's worse when I wake up in the morning.     Currently in Pain?  Yes    Pain Score  7     Pain Location  Elbow    Pain Orientation  Left    Pain Descriptors / Indicators  Burning;Constant    Pain Type  Acute pain         [facility] OT Assessment - 08/11/17 1310      Assessment   Medical Diagnosis  left elbow pain      Precautions   Precautions  None               OT Treatments/Exercises (OP) - 08/11/17 1310      Exercises   Exercises  Elbow;Wrist      Elbow Exercises   Elbow Extension  Strengthening;5 reps;10 reps    Bar Weights/Barbell (Elbow Extension)  -- 20#    Elbow Extension Limitations  [place] used; standing      Wrist Exercises   Wrist Extension  Strengthening;10 reps    Bar Weights/Barbell (Wrist Extension)  3 lbs    Wrist Extension Limitations  slow eccentric contraction during movement    Wrist Radial Deviation  Strengthening;10 reps    Bar Weights/Barbell (Radial Deviation)  3 lbs  Wrist Radial Deviation Limitations  slow eccentric contraction during movement    Wrist Ulnar Deviation  Strengthening;10 reps    Bar Weights/Barbell (Ulnar Deviation)  3 lbs    Wrist Ulnar Deviation Limitations  slow eccentric contraction during movement      Modalities   Modalities  Cryotherapy      Cryotherapy   Number Minutes Cryotherapy  2 Minutes    Cryotherapy Location  Forearm    Type of Cryotherapy  Ice massage      Manual Therapy   Manual Therapy  Soft tissue mobilization    Manual therapy comments  manual complete prior to exercises    Soft tissue mobilization  Myofascial release completed to left lateral epicondyle to decrease fascial restrictions and pain.             OT Education - 08/11/17 1303    Education Details  Pt provided with handout including ice massage, wrist strengthening with 3# weight, and  gentle hand squeeze using stress ball.     Person(s) Educated  Patient    Methods  Explanation;Demonstration;Handout    Comprehension  Returned demonstration;Verbalized understanding       OT Short Term Goals - 08/05/17 1403      OT SHORT TERM GOAL #1   Title  Patient will decrease facsial restrictions in left forearm in order to decrease pain and increase functional use of the left arm during daily and work activities.    Time  4    Period  Weeks    Status  New    Target Date  09/02/17      OT SHORT TERM GOAL #2   Title  Patient will reduce pain to a 3/10 or less in the LUE while completing daily tasks.    Time  4    Period  Weeks    Status  New      OT SHORT TERM GOAL #3   Title  Patient will increase left elbow and wrist strength to 5/5 in order to be able to complete lifting tasks at work with less difficulty.    Time  4    Period  Weeks    Status  New               Plan - 08/11/17 1305    Clinical Impression Statement  A: Initiated myofascial release to left lateral epicondyle due to increase pain level and fascial restrictions this session. Reviewed goals and presented patient with OT evaluation handout. HEP was established and reviewed. Pt reports burning session with strengthening exercises in the left forearm proximal to her elbow.     Plan  P: Complete laser at end of session to increase blood flow and decrease inflammation and pain. Continue with strengthening exercises of wrist and elbow.     Consulted and Agree with Plan of Care  Patient       Patient will benefit from skilled therapeutic intervention in order to improve the following deficits and impairments:  Increased edema, Increased fascial restrictions  Visit Diagnosis: Pain in left elbow  Other symptoms and signs involving the musculoskeletal system    Problem List Patient Active Problem List   Diagnosis Date Noted  . Nausea without vomiting 11/17/2016  . Current use of long term  anticoagulation 11/12/2016  . Atrial fibrillation with RVR ([place]) 11/07/2016  . Coronary artery disease due to lipid rich plaque   . RLQ abdominal pain 10/03/2016  . Yeast infection 03/13/2016  . Fibroids 03/13/2016  .  Screening for colorectal cancer 11/28/2015  . Burning with urination 11/28/2015  . Subacute frontal sinusitis 11/28/2015  . Leg cramps 10/31/2015  . Chest pain 10/31/2015  . Varicose 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 16/10/9602  . Body aches 11/21/2014  . Constipation 11/21/2014  . Vaginal itching 03/24/2014  . Vaginal discharge 03/24/2014  . Pelvic pain 11/02/2013  . Elevated cholesterol 11/02/2013  . Low back pain 10/20/2013  . Stiffness of ankle joint 10/20/2013  . Pain in joint, ankle and foot 10/20/2013  . Vaginal irritation 05/23/2013  . Hematuria 05/23/2013  . BV (bacterial vaginosis) 05/23/2013  . HEMATOCHEZIA 12/05/2009  . Dysphagia 12/05/2009  . CHEST WALL PAIN, ANTERIOR 06/23/2007  . LEG PAIN 05/18/2007  . VAGINAL PRURITUS 04/16/2007  . GOITER 03/10/2007  . HYPOTHYROIDISM 03/10/2007  . DIABETES MELLITUS, TYPE II 03/10/2007  . HYPERLIPIDEMIA 03/10/2007  . ANEMIA-IRON DEFICIENCY 03/10/2007  . Essential hypertension 03/10/2007  . RHINITIS, CHRONIC 03/10/2007  . ASTHMA, CHILDHOOD 03/10/2007  . GERD 03/10/2007  . PEPTIC ULCER DISEASE 03/10/2007  . MENOPAUSAL SYNDROME 03/10/2007   Ailene Ravel, OTR/L,CBIS  [phone]  08/11/2017, 1:30 PM  [place] [address] [place], [place], [address] Phone: [phone]   Fax:  [phone]  Name: Becky [name] MRN: [id number] Date of Birth: 10-Jun-1960

## 2017-08-11 NOTE — Patient Instructions (Signed)
For the follow exercises: Complete a slow 2-3 second count up and when coming down. Complete 10 reps. 2-3 times a day.   WRIST EXTENSION CURLS - TABLE  Hold a small free weight, rest your forearm on a table and bend your wrist up and down with your palm face down as shown.     FREE WEIGHT RADIAL DEVIATION - TABLE  Hold a small free weight, rest your forearm on a table and bend your wrist up and down with your palm facing towards the side as shown.      FREE WEIGHT SUPINATION AND PRONATION  Rest your forearm on your knee or a table. Next, while holding the end of a small weight, slowly lower the weight towards the outside and then rotate your forearm towards the inside of your body as shown.       BALL SQUEEZE  With an elastic ball, firmly squeeze it in the palm of your hand.     ICE MASSAGE TO LATERAL EPICONDYLE - COMMON WRIST EXTENSOR TENDON - TENNIS ELBOW  Place direct ice from an ice massage cup to the lateral epicondyle of the elbow as shown (the wrist extensor tendon area). Move the ice in a circular motion for up to 5 minutes (no more). Use towels to catch the water drippings. This is commonly the area of inflammation as describe with a Tennis Elbow injury.   You should feel 4 stages of sensations starting with... 1. Uncomfortable sensation of cold, then 2. Stinging, then 3. Burning or aching feeling, then 4. Numbness  If the pain is too great to handle, lift it off your skin for a few seconds, dab with towel and then place it back on for a few circular motions and repeat.  *Do not perform for more than 5 minutes or you may run the risk of frost bite and cause death to the tissue. Use a timer to be safe

## 2017-08-18 ENCOUNTER — Encounter: Payer: Self-pay | Admitting: Nurse Practitioner

## 2017-08-18 ENCOUNTER — Ambulatory Visit (INDEPENDENT_AMBULATORY_CARE_PROVIDER_SITE_OTHER): Payer: BLUE CROSS/BLUE SHIELD | Admitting: Nurse Practitioner

## 2017-08-18 VITALS — BP 152/89 | HR 76 | Temp 97.9°F | Ht 67.0 in | Wt 249.6 lb

## 2017-08-18 DIAGNOSIS — R110 Nausea: Secondary | ICD-10-CM | POA: Diagnosis not present

## 2017-08-18 DIAGNOSIS — R1031 Right lower quadrant pain: Secondary | ICD-10-CM | POA: Diagnosis not present

## 2017-08-18 DIAGNOSIS — K5900 Constipation, unspecified: Secondary | ICD-10-CM

## 2017-08-18 DIAGNOSIS — K219 Gastro-esophageal reflux disease without esophagitis: Secondary | ICD-10-CM

## 2017-08-18 DIAGNOSIS — R109 Unspecified abdominal pain: Secondary | ICD-10-CM | POA: Insufficient documentation

## 2017-08-18 MED ORDER — ONDANSETRON HCL 4 MG PO TABS: 4.0000 mg | ORAL_TABLET | Freq: Three times a day (TID) | ORAL | 2 refills | Status: DC | PRN

## 2017-08-18 NOTE — Addendum Note (Signed)
Addended by: Gordy Levan, Lashawn Orrego A on: 08/18/2017 10:26 AM   Modules accepted: Orders

## 2017-08-18 NOTE — Progress Notes (Signed)
CC'D TO PCP °

## 2017-08-18 NOTE — Assessment & Plan Note (Signed)
Patient's GERD symptoms are well controlled on PPI and Zantac.  Recommend she continue her current medications and follow-up in 6 months.

## 2017-08-18 NOTE — Patient Instructions (Signed)
1. Continue taking your current medications. 2. If you have a day where your constipation is worse despite taking Linzess she can try a Colace over-the-counter stool softener or MiraLAX mixed into a drink of your choice once a day. 3. Have your CT scan as recommended by your primary care. 4. Return for follow-up in 6 months. 5. Call us if you have any questions or concerns.  At [facility] we value your feedback. You may receive a survey about your visit today. Please share your experience as we strive to create trusting relationships with our patients to provide genuine, compassionate, quality care.  It was great to see you today!  I hope you have a great summer!!

## 2017-08-18 NOTE — Progress Notes (Signed)
Referring Provider: Redmond School, MD Primary Care Physician:  Redmond School, MD Primary GI:  Dr. Oneida Alar  Chief Complaint  Patient presents with  . Abdominal Pain    comes and goes, RLQ; PCP has CT scheduled 08/24/17  . Constipation    HPI:   Becky [name] is a 57 y.o. female who presents for follow-up on abdominal pain and constipation.  The patient was last seen in our office 02/18/2017 for GERD and constipation.  Colonoscopy and EGD up-to-date on file.  At her last visit she was doing well overall with occasional lower abdominal pain and improves after bowel movement.  Linzess 72 mcg generally controls her symptoms.  Stools are still sometimes hard.  GERD much improved with only one episode of breakthrough in 3 months.  On Dexilant and Zantac.  No other GI symptoms.  Recommended continue current medications, take Colace stool softener or MiraLAX for any breakthrough constipation.  Follow-up in 6 months.  Today she states she's doing ok overall. Having RLQ abdominal pain and she took a Linzess which made it worse; PCP has ordered a CT scan for Friday. Still with intermittent constipation, hasn't tried Colace yet. She was taking Linzess daily but ran out. However, she got a refill a couple weeks ago and taking daily again, constipation improved; "not as bad as it was." Has intermittent nausea, thinks it's related to sinus issues; requesting nausea medication refill. Denies vomiting. GERD doing well, she is wondering if she still needs Zantac. Denies hematochezia, melena. Denies chest pain, dyspnea, dizziness, lightheadedness, syncope, near syncope. Denies any other upper or lower GI symptoms.  Past Medical History:  Diagnosis Date  . Back pain   . Body aches 11/21/2014  . BV (bacterial vaginosis) 05/23/2013  . Constipation 11/21/2014  . Elevated cholesterol 11/02/2013  . Fibroids 03/13/2016  . Hematuria 05/23/2013  . Hyperlipidemia   . Hypertension   . Migraines   . PAF  (paroxysmal atrial fibrillation) ([place])    a. diagnosed in 11/2016 --> started on Xarelto for anticoagulation  . Pelvic pain in female 11/02/2013  . Plantar fasciitis of right foot   . Thyroid disease   . Vaginal discharge 03/24/2014  . Vaginal irritation 05/23/2013  . Vaginal itching 03/24/2014    Past Surgical History:  Procedure Laterality Date  . ECTOPIC PREGNANCY SURGERY    . ESOPHAGOGASTRODUODENOSCOPY  12/19/2009   ONG:EXBMWU stricture s/p dilation/mild gastritis  . ESOPHAGOGASTRODUODENOSCOPY N/A 12/10/2015   Procedure: ESOPHAGOGASTRODUODENOSCOPY (EGD);  Surgeon: Danie Binder, MD;  [facility];  Service: Endoscopy;  Laterality: N/A;  830  . ileocolonoscopy  12/19/2009   XLK:[id number] polyps/mild left-side diverticulosis/hemorrhoids  . KNEE SURGERY    . TUBAL LIGATION      Current Outpatient Medications  Medication Sig Dispense Refill  . acetaminophen (TYLENOL) 500 MG tablet Take 500 mg by mouth every 6 (six) hours as needed for moderate pain or headache.     [name] THYROID 90 MG tablet Take 90 mg by mouth daily.   4  . budesonide (RHINOCORT AQUA) 32 MCG/ACT nasal spray Place 1 spray into both nostrils daily as needed for rhinitis.    Marland Kitchen DEXILANT 60 MG capsule TAKE ONE (1) CAPSULE EACH DAY 30 capsule 5  . diltiazem (CARDIZEM CD) 120 MG 24 hr capsule Take 1 capsule (120 mg total) daily by mouth. 90 capsule 3  . Glycerin-Polysorbate 80 (REFRESH DRY EYE THERAPY OP) Apply 1 drop to eye daily as needed (dry eyes).    Marland Kitchen  linaclotide (LINZESS) 72 MCG capsule Take 1 capsule (72 mcg total) by mouth daily before breakfast. 90 capsule 3  . losartan (COZAAR) 100 MG tablet Take 100 mg by mouth daily.    . montelukast (SINGULAIR) 10 MG tablet Take 1 tablet by mouth daily.    . ondansetron (ZOFRAN) 4 MG tablet Take 1 tablet (4 mg total) every 8 (eight) hours as needed by mouth for nausea or vomiting. 20 tablet 2  . ranitidine (ZANTAC) 150 MG capsule Take 1 capsule (150 mg total)  by mouth every evening. 30 capsule 5  . rivaroxaban (XARELTO) 20 MG TABS tablet Take 1 tablet (20 mg total) daily with supper by mouth. 90 tablet 3  . rosuvastatin (CRESTOR) 5 MG tablet Take 1 tablet (5 mg total) by mouth daily. 90 tablet 3  . spironolactone (ALDACTONE) 25 MG tablet Take 1 tablet (25 mg total) by mouth daily. 90 tablet 3  . spironolactone (ALDACTONE) 25 MG tablet TAKE ONE (1) TABLET BY MOUTH EVERY DAY 90 tablet 3   No current facility-administered medications for this visit.     Allergies as of 08/18/2017 - Review Complete 08/18/2017  Allergen Reaction Noted  . Hydralazine Nausea Only 01/30/2016  . Prednisone Swelling 01/30/2015    Family History  Problem Relation Age of Onset  . Heart failure Mother   . Hypertension Mother   . Diabetes Mother   . Heart failure Father   . Hypertension Father   . Heart failure Brother   . Hypertension Sister   . Other Sister        blocked artery in neck; knee replacement  . Other Brother        triple bypass surgery  . Hypertension Sister   . Diabetes Sister   . Colon cancer Neg Hx     Social History   Socioeconomic History  . Marital status: Married    Spouse name: Not on file  . Number of children: Not on file  . Years of education: Not on file  . Highest education level: Not on file  Occupational History  . Not on file  Social Needs  . Financial resource strain: Not on file  . Food insecurity:    Worry: Not on file    Inability: Not on file  . Transportation needs:    Medical: Not on file    Non-medical: Not on file  Tobacco Use  . Smoking status: Former Smoker    Types: Cigarettes    Start date: 01/07/1975    Last attempt to quit: 2005    Years since quitting: 14.6  . Smokeless tobacco: Never Used  Substance and Sexual Activity  . Alcohol use: No    Alcohol/week: 0.0 standard drinks  . Drug use: No  . Sexual activity: Yes    Birth control/protection: Post-menopausal, Surgical    Comment: tubal    Lifestyle  . Physical activity:    Days per week: Not on file    Minutes per session: Not on file  . Stress: Not on file  Relationships  . Social connections:    Talks on phone: Not on file    Gets together: Not on file    Attends religious service: Not on file    Active member of club or organization: Not on file    Attends meetings of clubs or organizations: Not on file    Relationship status: Not on file  Other Topics Concern  . Not on file  Social History Narrative  .  Not on file    Review of Systems: Complete ROS negative except as per HPI.   Physical Exam: BP (!) 152/89   Pulse 76   Temp 97.9 F (36.6 C) (Oral)   Ht 5\' 7"  (1.702 m)   Wt 249 lb 9.6 oz (113.2 kg)   BMI 39.09 kg/m  General:   Alert and oriented. Pleasant and cooperative. Well-nourished and well-developed.  Eyes:  Without icterus, sclera clear and conjunctiva pink.  Ears:  Normal auditory acuity. Cardiovascular:  S1, S2 present without murmurs appreciated. Extremities without clubbing or edema. Respiratory:  Clear to auscultation bilaterally. No wheezes, rales, or rhonchi. No distress.  Gastrointestinal:  +BS, soft, non-tender and non-distended. No HSM noted. No guarding or rebound. No masses appreciated.  Rectal:  Deferred  Musculoskalatal:  Symmetrical without gross deformities. Neurologic:  Alert and oriented x4;  grossly normal neurologically. Psych:  Alert and cooperative. Normal mood and affect. Heme/Lymph/Immune: No excessive bruising noted.    08/18/2017 10:04 AM   Disclaimer: This note was dictated with voice recognition software. Similar sounding words can inadvertently be transcribed and may not be corrected upon review.

## 2017-08-18 NOTE — Assessment & Plan Note (Signed)
Overall it seems her constipation is generally well controlled when she takes Linzess daily.  She occasionally misses a dose and for a while she was out of her medication for a week or 2 until she got a refill.  She does rarely have a day with breakthrough constipation.  She has not tried Colace as previously recommended.  I again recommended she try Colace or MiraLAX for breakthrough constipation on Linzess.  Return for follow-up in 6 months.

## 2017-08-18 NOTE — Assessment & Plan Note (Signed)
The patient notes intermittent right lower quadrant abdominal pain.  She took Linzess and had a bowel movement which she feels made her pain worse at one point.  She discussed with her primary care and they are suspicious of possible diverticulitis versus appendix issues.  They have ordered a CT scan which is scheduled for this week.  Recommend she continue with her primary care request for CT of her abdomen to further evaluate.  Follow-up in 6 months.

## 2017-08-19 ENCOUNTER — Ambulatory Visit (HOSPITAL_COMMUNITY): Payer: BLUE CROSS/BLUE SHIELD

## 2017-08-19 ENCOUNTER — Telehealth (HOSPITAL_COMMUNITY): Payer: Self-pay | Admitting: Internal Medicine

## 2017-08-19 ENCOUNTER — Encounter (HOSPITAL_COMMUNITY): Payer: Self-pay

## 2017-08-19 NOTE — Telephone Encounter (Signed)
08/19/17  Pt didn't come for her appt. But there was a messge left to call her but no other information was left.

## 2017-08-24 ENCOUNTER — Ambulatory Visit (HOSPITAL_COMMUNITY)
Admission: RE | Admit: 2017-08-24 | Discharge: 2017-08-24 | Disposition: A | Discharge status: Home / Self Care | Payer: BLUE CROSS/BLUE SHIELD | Source: Ambulatory Visit | Attending: Family Medicine | Admitting: Family Medicine

## 2017-08-24 DIAGNOSIS — K760 Fatty (change of) liver, not elsewhere classified: Secondary | ICD-10-CM | POA: Insufficient documentation

## 2017-08-24 DIAGNOSIS — R109 Unspecified abdominal pain: Secondary | ICD-10-CM | POA: Diagnosis not present

## 2017-08-24 DIAGNOSIS — R1031 Right lower quadrant pain: Secondary | ICD-10-CM | POA: Insufficient documentation

## 2017-08-24 DIAGNOSIS — K5730 Diverticulosis of large intestine without perforation or abscess without bleeding: Secondary | ICD-10-CM | POA: Insufficient documentation

## 2017-08-24 DIAGNOSIS — D3502 Benign neoplasm of left adrenal gland: Secondary | ICD-10-CM | POA: Insufficient documentation

## 2017-08-24 LAB — POCT I-STAT CREATININE: Creatinine, Ser: 0.9 mg/dL (ref 0.44–1.00)

## 2017-08-24 MED ORDER — IOPAMIDOL (ISOVUE-300) INJECTION 61%
100.0000 mL | Freq: Once | INTRAVENOUS | Status: AC | PRN
  Administered 2017-08-24: 100 mL via INTRAVENOUS

## 2017-08-26 ENCOUNTER — Telehealth (HOSPITAL_COMMUNITY): Payer: Self-pay

## 2017-08-26 ENCOUNTER — Encounter (HOSPITAL_COMMUNITY): Payer: Self-pay

## 2017-08-26 ENCOUNTER — Other Ambulatory Visit: Payer: Self-pay

## 2017-08-26 ENCOUNTER — Ambulatory Visit (HOSPITAL_COMMUNITY): Payer: BLUE CROSS/BLUE SHIELD

## 2017-08-26 DIAGNOSIS — R293 Abnormal posture: Secondary | ICD-10-CM | POA: Diagnosis not present

## 2017-08-26 DIAGNOSIS — M542 Cervicalgia: Secondary | ICD-10-CM | POA: Diagnosis not present

## 2017-08-26 DIAGNOSIS — M25522 Pain in left elbow: Secondary | ICD-10-CM | POA: Diagnosis not present

## 2017-08-26 DIAGNOSIS — R29898 Other symptoms and signs involving the musculoskeletal system: Secondary | ICD-10-CM | POA: Diagnosis not present

## 2017-08-26 DIAGNOSIS — M6281 Muscle weakness (generalized): Secondary | ICD-10-CM | POA: Diagnosis not present

## 2017-08-26 NOTE — Therapy (Signed)
[place], [place], [address] Phone: [phone]   Fax:  [phone]  Occupational Therapy Treatment  Patient Details  Name: Becky [name] MRN: [id number] Date of Birth: [birth year] Referring Provider: [name], MD   Encounter Date: 08/26/2017  OT End of Session - 08/26/17 1050    Visit Number  3    Number of Visits  5    Date for OT Re-Evaluation  09/02/17    Authorization Type  Primary is [employer] - $25 copay and visits based on medical necessity; secondary is also [place] may be covered    Authorization Time Period  visits based on medical necessity    OT Start Time  [id number]    OT Stop Time  1026    OT Time Calculation (min)  38 min    Activity Tolerance  Patient tolerated treatment well    Behavior During Therapy  [facility] for tasks assessed/performed       Past Medical History:  Diagnosis Date  . Back pain   . Body aches 11/21/2014  . BV (bacterial vaginosis) 05/23/2013  . Constipation 11/21/2014  . Elevated cholesterol 11/02/2013  . Fibroids 03/13/2016  . Hematuria 05/23/2013  . Hyperlipidemia   . Hypertension   . Migraines   . PAF (paroxysmal atrial fibrillation) ([place])    a. diagnosed in 11/2016 --> started on Xarelto for anticoagulation  . Pelvic pain in female 11/02/2013  . Plantar fasciitis of right foot   . Thyroid disease   . Vaginal discharge 03/24/2014  . Vaginal irritation 05/23/2013  . Vaginal itching 03/24/2014    Past Surgical History:  Procedure Laterality Date  . ECTOPIC PREGNANCY SURGERY    . ESOPHAGOGASTRODUODENOSCOPY  12/19/2009   DJS:HFWYOV stricture s/p dilation/mild gastritis  . ESOPHAGOGASTRODUODENOSCOPY N/A 12/10/2015   Procedure: ESOPHAGOGASTRODUODENOSCOPY (EGD);  Surgeon: Danie Binder, MD;  [facility];  Service: Endoscopy;  Laterality: N/A;  830  . ileocolonoscopy  12/19/2009   ZCH:YIFOYDXAJOIN polyps/mild left-side diverticulosis/hemorrhoids  . KNEE  SURGERY    . TUBAL LIGATION      There were no vitals filed for this visit.  Subjective Assessment - 08/26/17 1012    Currently in Pain?  Yes    Pain Score  5     Pain Location  Elbow    Pain Orientation  Left    Pain Descriptors / Indicators  Burning;Aching;Constant    Pain Type  Acute pain    Pain Radiating Towards  occassionally it will shoot up her arm.     Pain Onset  More than a month ago    Pain Frequency  Constant    Aggravating Factors   gripping things at work    Pain Relieving Factors  ice, heat, rest    Effect of Pain on Daily Activities  moderate effect on ADLs         [facility] OT Assessment - 08/26/17 1015      Assessment   Medical Diagnosis  left elbow pain      Precautions   Precautions  None               OT Treatments/Exercises (OP) - 08/26/17 1015      Exercises   Exercises  Elbow;Wrist      Elbow Exercises   Forearm Supination  Strengthening;10 reps    Bar Weights/Barbell (Forearm Supination)  4 lbs    Forearm Pronation  Strengthening;10 reps    Bar Weights/Barbell (Forearm Pronation)  4 lbs    Other elbow exercises  Body craft: Standing elbow extension with pulleys. 20#, 30#, 40# each completed 10X. Initially, patient completed with 20# and 30# with bilateral arms then 40# was completed with just left due to pain on right side.         Wrist Exercises   Wrist Flexion  Strengthening;10 reps    Bar Weights/Barbell (Wrist Flexion)  4 lbs    Wrist Extension  Strengthening;10 reps    Bar Weights/Barbell (Wrist Extension)  4 lbs    Wrist Extension Limitations  slow eccentric movement    Wrist Radial Deviation  Strengthening;10 reps    Bar Weights/Barbell (Radial Deviation)  4 lbs    Wrist Radial Deviation Limitations  slow eccentric movement    Wrist Ulnar Deviation  Strengthening;10 reps    Bar Weights/Barbell (Ulnar Deviation)  4 lbs    Wrist Ulnar Deviation Limitations  slow eccentric movement      Manual Therapy   Manual Therapy  Soft  tissue mobilization    Manual therapy comments  manual complete prior to exercises    Soft tissue mobilization  Myofascial release completed to left lateral epicondyle to decrease fascial restrictions and pain.               OT Short Term Goals - 08/26/17 1011      OT SHORT TERM GOAL #1   Title  Patient will decrease facsial restrictions in left forearm in order to decrease pain and increase functional use of the left arm during daily and work activities.    Time  4    Period  Weeks    Status  On-going      OT SHORT TERM GOAL #2   Title  Patient will reduce pain to a 3/10 or less in the LUE while completing daily tasks.    Time  4    Period  Weeks    Status  On-going      OT SHORT TERM GOAL #3   Title  Patient will increase left elbow and wrist strength to 5/5 in order to be able to complete lifting tasks at work with less difficulty.    Time  4    Period  Weeks    Status  On-going               Plan - 08/26/17 1050    Clinical Impression Statement  A: pt reports that pain is slightly decreased in left elbow although the same pain is being experienced in her right elbow. She has been doing more with the right to let the left side relax. She has purchased a elbow compression sleeve at [facility] and has been wearing it as needed. pt reports that she feels that it helps. Laser was unable to be used this session as it needs to be serviced.     Plan  P: Reassessment. Determine if more OT services are needed. If right elbow is continuing to bother patient we could request to add it to our treatment plan to the MD if patient wishes.     Consulted and Agree with Plan of Care  Patient       Patient will benefit from skilled therapeutic intervention in order to improve the following deficits and impairments:  Increased edema, Increased fascial restrictions  Visit Diagnosis: Pain in left elbow  Other symptoms and signs involving the musculoskeletal  system    Problem List Patient Active Problem List   Diagnosis  Date Noted  . Abdominal pain 08/18/2017  . Nausea without vomiting 11/17/2016  . Current use of long term anticoagulation 11/12/2016  . Atrial fibrillation with RVR ([place]) 11/07/2016  . Coronary artery disease due to lipid rich plaque   . RLQ abdominal pain 10/03/2016  . Yeast infection 03/13/2016  . Fibroids 03/13/2016  . Screening for colorectal cancer 11/28/2015  . Burning with urination 11/28/2015  . Subacute frontal sinusitis 11/28/2015  . Leg cramps 10/31/2015  . Chest pain 10/31/2015  . Varicose 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 91/69/4503  . Body aches 11/21/2014  . Constipation 11/21/2014  . Vaginal itching 03/24/2014  . Vaginal discharge 03/24/2014  . Pelvic pain 11/02/2013  . Elevated cholesterol 11/02/2013  . Low back pain 10/20/2013  . Stiffness of ankle joint 10/20/2013  . Pain in joint, ankle and foot 10/20/2013  . Vaginal irritation 05/23/2013  . Hematuria 05/23/2013  . BV (bacterial vaginosis) 05/23/2013  . HEMATOCHEZIA 12/05/2009  . Dysphagia 12/05/2009  . CHEST WALL PAIN, ANTERIOR 06/23/2007  . LEG PAIN 05/18/2007  . VAGINAL PRURITUS 04/16/2007  . GOITER 03/10/2007  . HYPOTHYROIDISM 03/10/2007  . DIABETES MELLITUS, TYPE II 03/10/2007  . HYPERLIPIDEMIA 03/10/2007  . ANEMIA-IRON DEFICIENCY 03/10/2007  . Essential hypertension 03/10/2007  . RHINITIS, CHRONIC 03/10/2007  . ASTHMA, CHILDHOOD 03/10/2007  . GERD 03/10/2007  . PEPTIC ULCER DISEASE 03/10/2007  . MENOPAUSAL SYNDROME 03/10/2007   Ailene Ravel, OTR/L,CBIS  [phone]  08/26/2017, 10:54 AM  [place] [address] [place], [place], [address] Phone: [phone]   Fax:  [phone]  Name: Becky [name] MRN: [id number] Date of Birth: Jul 05, 1960

## 2017-08-26 NOTE — Telephone Encounter (Signed)
PT requested that this OT referral be close-the treatment that the patient is receiving for her neck pain will address the bilateral shoulder pain. Therefore, the patient doesn't need another OT eval to cover this issue. Pt is being seen by PT and OT and doing well at this time. NF 08/26/2017

## 2017-08-26 NOTE — Patient Instructions (Signed)
  AROM: Lateral Neck Flexion   Slowly tilt head toward one shoulder, then the other. Hold each position ____ seconds. Repeat ____ times per set. Do ____ sets per session. Do ____ sessions per day.  http://orth.exer.us/296   Copyright  VHI. All rights reserved.  AROM: Neck Extension   Bend head backward. Hold ____ seconds. Repeat ____ times per set. Do ____ sets per session. Do ____ sessions per day.  http://orth.exer.us/300   Copyright  VHI. All rights reserved.  AROM: Neck Flexion   Bend head forward. Hold ____ seconds. Repeat ____ times per set. Do ____ sets per session. Do ____ sessions per day.  http://orth.exer.us/298   Copyright  VHI. All rights reserved.  AROM: Neck Rotation   Turn head slowly to look over one shoulder, then the other. Hold each position ____ seconds. Repeat ____ times per set. Do ____ sets per session. Do ____ sessions per day.  http://orth.exer.us/294   Copyright  VHI. All rights reserved.      CHIN TUCK - SUPINE  While lying on your back, tuck your chin towards your chest and press the back of your head into the table.  Maintain contact of head with the surface you are lying on the entire time.

## 2017-08-26 NOTE — Therapy (Signed)
[place], [place], [address] Phone: [phone]   Fax:  [phone]  Physical Therapy Evaluation  Patient Details  Name: Becky [name] MRN: [id number] Date of Birth: 1960-10-13 Referring Provider: Justice Britain, MD   Encounter Date: 08/26/2017  PT End of Session - 08/26/17 1039    Visit Number  1    Number of Visits  9    Date for PT Re-Evaluation  09/23/17    Authorization Type  [employer] (no auth required, no visit limit)    Authorization Time Period  08/26/2017 - 09/25/2017    Authorization - Visit Number  1    Authorization - Number of Visits  10    PT Start Time  1033    PT Stop Time  1115    PT Time Calculation (min)  42 min    Activity Tolerance  Patient tolerated treatment well    Behavior During Therapy  [facility] for tasks assessed/performed       Past Medical History:  Diagnosis Date  . Back pain   . Body aches 11/21/2014  . BV (bacterial vaginosis) 05/23/2013  . Constipation 11/21/2014  . Elevated cholesterol 11/02/2013  . Fibroids 03/13/2016  . Hematuria 05/23/2013  . Hyperlipidemia   . Hypertension   . Migraines   . PAF (paroxysmal atrial fibrillation) ([place])    a. diagnosed in 11/2016 --> started on Xarelto for anticoagulation  . Pelvic pain in female 11/02/2013  . Plantar fasciitis of right foot   . Thyroid disease   . Vaginal discharge 03/24/2014  . Vaginal irritation 05/23/2013  . Vaginal itching 03/24/2014    Past Surgical History:  Procedure Laterality Date  . ECTOPIC PREGNANCY SURGERY    . ESOPHAGOGASTRODUODENOSCOPY  12/19/2009   GBT:DVVOHY stricture s/p dilation/mild gastritis  . ESOPHAGOGASTRODUODENOSCOPY N/A 12/10/2015   Procedure: ESOPHAGOGASTRODUODENOSCOPY (EGD);  Surgeon: Danie Binder, MD;  [facility];  Service: Endoscopy;  Laterality: N/A;  830  . ileocolonoscopy  12/19/2009   WVP:XTGGYIRSWNIO polyps/mild left-side diverticulosis/hemorrhoids  . KNEE SURGERY    .  TUBAL LIGATION      There were no vitals filed for this visit.   Subjective Assessment - 08/26/17 1032    Subjective  Patient reports she has had neck pain for over a year but that it has worsened in the last 2-3 months. She reports it is central spine pain in her neck and that she feels a tightness in bil neck and upper shoulder, but it is greater on the Rt side. She reports sometimes it feels like a aching, burning sensation along her neck and shoulders as well as down between her shoulder blades. She reports placing heat on her neck helps to decrease her pain.    Patient is accompained by:  Family member   husband (left after 15 minutes)   Limitations  Sitting;Lifting;Standing;House hold activities    How long can you sit comfortably?  3-4 hours    How long can you stand comfortably?  3-4 hours    How long can you walk comfortably?  3-4 hours     Patient Stated Goals  to have less pain/stiffness in neck during work    Currently in Pain?  Yes    Pain Score  5     Pain Location  Neck    Pain Orientation  Upper;Right;Left;Posterior    Pain Descriptors / Indicators  Aching;Burning    Pain Type  Chronic pain    Pain Onset  More than a month ago    Pain Frequency  Intermittent    Aggravating Factors   lifting things, sitting or standing in same place/position for prolonged periods of time    Pain Relieving Factors  heat    Effect of Pain on Daily Activities  moderate effect         [facility] PT Assessment - 08/26/17 1039      Assessment   Medical Diagnosis  Cervicalgia    Referring Provider  Justice Britain, MD    Hand Dominance  Right    Next MD Visit  08/31/17      Precautions   Precautions  None      Restrictions   Weight Bearing Restrictions  No      Balance Screen   Has the patient fallen in the past 6 months  No    Has the patient had a decrease in activity level because of a fear of falling?   No    Is the patient reluctant to leave their home because of a fear of falling?    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    Leisure  relaxign at home, watching movies      Cognition   Overall Cognitive Status  Within Functional Limits for tasks assessed      Observation/Other Assessments   Focus on Therapeutic Outcomes (FOTO)   36% limited      AROM   AROM Assessment Site  Cervical    Cervical Flexion  58    Cervical Extension  49   pain on Rt side   Cervical - Right Side Bend  40   pain/discomfort on Rt side   Cervical - Left Side Bend  45    Cervical - Right Rotation  60   disco fort and tingling   Cervical - Left Rotation  85      Strength   Overall Strength Comments  Deep neck flexor endurance test: 14 seconds    Strength Assessment Site  Shoulder;Elbow;Wrist    Right Shoulder Flexion  4/5    Right Shoulder ABduction  4+/5    Left Shoulder Flexion  4/5   pain in shoulder   Left Shoulder ABduction  4/5    Right Elbow Flexion  4+/5    Right Elbow Extension  4+/5    Left Elbow Flexion  4+/5    Left Elbow Extension  4+/5    Right Wrist Flexion  4+/5    Right Wrist Extension  4+/5    Left Wrist Flexion  4+/5    Left Wrist Extension  4+/5   pain in Lt elbow     Palpation   Spinal mobility  hypomobile throughout cervical spine, pain provocation with PA's to C3-T1    Palpation comment  tenderness to palpation along upper trapezius and cervical paraspinals bil, tenderness along SCM Rt>Lt      Special Tests    Special Tests  Cervical    Cervical Tests  Spurling's;Dictraction      Spurling's   Findings  Negative    Side  Right      Distraction Test   Findngs  Positive    side  Right  Objective measurements completed on examination: See above findings.      [facility] Adult PT Treatment/Exercise - 08/26/17 1704       Exercises   Exercises  Neck      Neck Exercises: Seated   Cervical Rotation  Right;Left;5 reps    Lateral Flexion  Right;Left;5 reps    Other Seated Exercise  Cervical Flexion/Extension: 5 reps each      Neck Exercises: Supine   Neck Retraction  10 reps;3 secs        PT Education - 08/26/17 1426    Education Details  Educated on evaluation findings and on appropriate POC. Educated on initial HEP.    Person(s) Educated  Patient    Methods  Explanation    Comprehension  Verbalized understanding;Returned demonstration       PT Short Term Goals - 08/26/17 1706      PT SHORT TERM GOAL #1   Title  Patient will be independent with HEP, updated PRN, to improve postural strength and cervical ROM, as well as activity tolerance with work activities.    Time  2    Period  Weeks    Status  New    Target Date  09/09/17      PT SHORT TERM GOAL #2   Title  Patient will improve Cervical Rotation by 8 degrees towards Rt to demonstrate significant improvement in cervical mobility.    Time  2    Period  Weeks    Status  New        PT Long Term Goals - 08/26/17 1711      PT LONG TERM GOAL #1   Title  Patient will improve all limited cervical ROM to WNL's and Rt cervical rotation to 75 degrees or greater.    Time  4    Period  Weeks    Status  New    Target Date  09/23/17      PT LONG TERM GOAL #2   Title  Patient will improve bil UE strength by 1/2 grade for all limited groups to improve functional strength for greater endurance and activity tolerance at work.    Time  4    Period  Weeks    Status  New      PT LONG TERM GOAL #3   Title  Patient will demonstrate proper lifting mechanics for floor to overhead with 25 lbs and floor to waist with 40 lbs to improve mechanics with increased load for safe mobility at work to reduce injury risk.    Time  4    Period  Weeks    Status  New      PT LONG TERM GOAL #4   Title  Patient will sleep through the night without waking up with  neck pain for 1 week to demonstrate improve mobility and tolerance to sustained positions with cervical spine.    Time  4    Period  Weeks    Status  New         Plan - 08/26/17 1428    Clinical Impression Statement  Becky [name] presents for physical therapy evaluation for cervicalgia and bil shoulder pain. She reports a greater than 1 year history of neck pain that has worsened in the last 2-3 months and reports a high demanding job with substantial lifting requirements. She presents with weakness of bil UE's and weakness of deep cervical flexors. She has poor posture and body mechanics and is limited with cervical ROM  in right rotation. She has greater pain along her Rt neck and is hypomobile throughout cervical spine with symptom provocation with PA's. She will benefit from skilled PT intervention to address impairments and improve posture and body mechanics to imprvoe endurance for work activities.    Clinical Presentation  Stable    Clinical Presentation due to:  ROM, MMT, spurlings, distraction, FOTO, clinical judgement    Clinical Decision Making  Low    Rehab Potential  Good    PT Frequency  2x / week    PT Duration  4 weeks    PT Treatment/Interventions  ADLs/Self Care Home Management;Cryotherapy;Electrical Stimulation;Moist Heat;Traction;Functional mobility training;Therapeutic activities;Therapeutic exercise;Balance training;Neuromuscular re-education;Patient/family education;Manual techniques;Passive range of motion;Dry needling;Energy conservation;Taping    PT Next Visit Plan  Review evaluation and goals. Review HEP and initiate cervical spine mobilization if with PT. Perform soft tissue work to bil upper trap, cervical paraspinals,     PT Home Exercise Plan  eval: cervical excursion, chin tucks    Consulted and Agree with Plan of Care  Patient       Patient will benefit from skilled therapeutic intervention in order to improve the following deficits and impairments:  Decreased  endurance, Decreased mobility, Increased muscle spasms, Improper body mechanics, Decreased range of motion, Decreased activity tolerance, Decreased strength, Increased fascial restricitons, Impaired flexibility, Pain, Postural dysfunction  Visit Diagnosis: Cervicalgia  Abnormal posture  Muscle weakness (generalized)     Problem List Patient Active Problem List   Diagnosis Date Noted  . Abdominal pain 08/18/2017  . Nausea without vomiting 11/17/2016  . Current use of long term anticoagulation 11/12/2016  . Atrial fibrillation with RVR ([place]) 11/07/2016  . Coronary artery disease due to lipid rich plaque   . RLQ abdominal pain 10/03/2016  . Yeast infection 03/13/2016  . Fibroids 03/13/2016  . Screening for colorectal cancer 11/28/2015  . Burning with urination 11/28/2015  . Subacute frontal sinusitis 11/28/2015  . Leg cramps 10/31/2015  . Chest pain 10/31/2015  . Varicose 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 74/08/1446  . Body aches 11/21/2014  . Constipation 11/21/2014  . Vaginal itching 03/24/2014  . Vaginal discharge 03/24/2014  . Pelvic pain 11/02/2013  . Elevated cholesterol 11/02/2013  . Low back pain 10/20/2013  . Stiffness of ankle joint 10/20/2013  . Pain in joint, ankle and foot 10/20/2013  . Vaginal irritation 05/23/2013  . Hematuria 05/23/2013  . BV (bacterial vaginosis) 05/23/2013  . HEMATOCHEZIA 12/05/2009  . Dysphagia 12/05/2009  . CHEST WALL PAIN, ANTERIOR 06/23/2007  . LEG PAIN 05/18/2007  . VAGINAL PRURITUS 04/16/2007  . GOITER 03/10/2007  . HYPOTHYROIDISM 03/10/2007  . DIABETES MELLITUS, TYPE II 03/10/2007  . HYPERLIPIDEMIA 03/10/2007  . ANEMIA-IRON DEFICIENCY 03/10/2007  . Essential hypertension 03/10/2007  . RHINITIS, CHRONIC 03/10/2007  . ASTHMA, CHILDHOOD 03/10/2007  . GERD 03/10/2007  . PEPTIC ULCER DISEASE 03/10/2007  . MENOPAUSAL SYNDROME 03/10/2007    Kipp Brood, PT, DPT Physical Therapist with [facility]  08/26/2017 5:16 PM    [place], [place], [address] Phone: [phone]   Fax:  [phone]  Name: Becky [name] MRN: [id number] Date of Birth: [birth year]

## 2017-08-31 DIAGNOSIS — M7541 Impingement syndrome of right shoulder: Secondary | ICD-10-CM | POA: Diagnosis not present

## 2017-08-31 DIAGNOSIS — M7542 Impingement syndrome of left shoulder: Secondary | ICD-10-CM | POA: Diagnosis not present

## 2017-09-02 ENCOUNTER — Ambulatory Visit (HOSPITAL_COMMUNITY): Payer: BLUE CROSS/BLUE SHIELD | Admitting: Occupational Therapy

## 2017-09-02 ENCOUNTER — Ambulatory Visit (HOSPITAL_COMMUNITY): Payer: BLUE CROSS/BLUE SHIELD

## 2017-09-02 ENCOUNTER — Encounter (HOSPITAL_COMMUNITY): Payer: Self-pay

## 2017-09-02 ENCOUNTER — Encounter (HOSPITAL_COMMUNITY): Payer: Self-pay | Admitting: Occupational Therapy

## 2017-09-02 DIAGNOSIS — R293 Abnormal posture: Secondary | ICD-10-CM

## 2017-09-02 DIAGNOSIS — M6281 Muscle weakness (generalized): Secondary | ICD-10-CM

## 2017-09-02 DIAGNOSIS — M542 Cervicalgia: Secondary | ICD-10-CM

## 2017-09-02 DIAGNOSIS — M25522 Pain in left elbow: Secondary | ICD-10-CM | POA: Diagnosis not present

## 2017-09-02 DIAGNOSIS — R29898 Other symptoms and signs involving the musculoskeletal system: Secondary | ICD-10-CM | POA: Diagnosis not present

## 2017-09-02 NOTE — Therapy (Signed)
[place], [place], [address] Phone: [phone]   Fax:  [phone]  Occupational Therapy Reassessment, Treatment (recertification)  Patient Details  Name: Becky [name] MRN: [id number] Date of Birth: 1960/05/31 Referring Provider: [name], MD   Encounter Date: 09/02/2017  OT End of Session - 09/02/17 1119    Visit Number  4    Number of Visits  8    Date for OT Re-Evaluation  10/02/17    Authorization Type  Primary is [employer] - $25 copay and visits based on medical necessity; secondary is also [place] may be covered    Authorization Time Period  visits based on medical necessity    OT Start Time  1032    OT Stop Time  1114    OT Time Calculation (min)  42 min    Activity Tolerance  Patient tolerated treatment well    Behavior During Therapy  [facility] for tasks assessed/performed       Past Medical History:  Diagnosis Date  . Back pain   . Body aches 11/21/2014  . BV (bacterial vaginosis) 05/23/2013  . Constipation 11/21/2014  . Elevated cholesterol 11/02/2013  . Fibroids 03/13/2016  . Hematuria 05/23/2013  . Hyperlipidemia   . Hypertension   . Migraines   . PAF (paroxysmal atrial fibrillation) ([place])    a. diagnosed in 11/2016 --> started on Xarelto for anticoagulation  . Pelvic pain in female 11/02/2013  . Plantar fasciitis of right foot   . Thyroid disease   . Vaginal discharge 03/24/2014  . Vaginal irritation 05/23/2013  . Vaginal itching 03/24/2014    Past Surgical History:  Procedure Laterality Date  . ECTOPIC PREGNANCY SURGERY    . ESOPHAGOGASTRODUODENOSCOPY  12/19/2009   QIH:[id number] stricture s/p dilation/mild gastritis  . ESOPHAGOGASTRODUODENOSCOPY N/A 12/10/2015   Procedure: ESOPHAGOGASTRODUODENOSCOPY (EGD);  Surgeon: Danie Binder, MD;  [facility];  Service: Endoscopy;  Laterality: N/A;  830  . ileocolonoscopy  12/19/2009   DGL:[id number] polyps/mild left-side  diverticulosis/hemorrhoids  . KNEE SURGERY    . TUBAL LIGATION      There were no vitals filed for this visit.  Subjective Assessment - 09/02/17 1033    Subjective   S: I have trouble at work, my arm gets tired.     Currently in Pain?  No/denies         [facility] OT Assessment - 09/02/17 1033      Assessment   Medical Diagnosis  left elbow pain    Referring Provider  [name], MD      Precautions   Precautions  None      Palpation   Palpation comment  moderate fascial restrictions along anterior forearm region      Strength   Strength Assessment Site  Elbow;Wrist    Left Elbow Flexion  5/5   previous 4+/5   Left Elbow Extension  4+/5   same as previous   Left Wrist Flexion  4+/5   same as previous   Left Wrist Extension  4+/5   same as previous   Left Wrist Radial Deviation  5/5   4/5 previous   Left Wrist Ulnar Deviation  4+/5   previous 4/5     Hand Function   Left Hand Grip (lbs)  37   previous 47   Left Hand Lateral Pinch  16 lbs   previous 12   Left 3 point pinch  9 lbs   previous 11  OT Treatments/Exercises (OP) - 09/02/17 1056      Exercises   Exercises  Elbow;Wrist;Hand;Theraputty      Elbow Exercises   Forearm Supination  Theraband;10 reps    Theraband Level (Supination)  Level 2 (Red)    Forearm Pronation  Theraband;10 reps    Theraband Level (Pronation)  Level 2 (Red)    Other elbow exercises  pt used pvc pipe to cut circles in red theraputty working on grip strength, forearm/elbow strength during rotation movements      Additional Elbow Exercises   Theraputty - Flatten  red     Hand Gripper with Large Beads  All beads gripper at 29#    Hand Gripper with Medium Beads  All beads gripper at 29#      Wrist Exercises   Wrist Flexion  Theraband;10 reps    Theraband Level (Wrist Flexion)  Level 2 (Red)    Wrist Extension  Theraband;10 reps    Theraband Level (Wrist Extension)  Level 2 (Red)    Wrist Radial Deviation   Theraband;10 reps    Theraband Level (Radial Deviation)  Level 2 (Red)      Manual Therapy   Manual Therapy  Soft tissue mobilization    Manual therapy comments  manual complete prior to exercises    Soft tissue mobilization  Myofascial release completed to left lateral epicondyle to decrease fascial restrictions and pain.               OT Short Term Goals - 09/02/17 1120      OT SHORT TERM GOAL #1   Title  Patient will decrease facsial restrictions in left forearm in order to decrease pain and increase functional use of the left arm during daily and work activities.    Time  4    Period  Weeks    Status  On-going    Target Date  10/02/17      OT SHORT TERM GOAL #2   Title  Patient will reduce pain to a 3/10 or less in the LUE while completing daily tasks.    Time  4    Period  Weeks    Status  On-going      OT SHORT TERM GOAL #3   Title  Patient will increase left elbow and wrist strength to 5/5 in order to be able to complete lifting tasks at work with less difficulty.    Time  4    Period  Weeks    Status  On-going      OT SHORT TERM GOAL #4   Title  Pt will increase grip strength in left hand by 15# and tip pinch by 3# to improve ability to hold onto items during daily and work tasks.     Time  4    Period  Weeks    Status  New               Plan - 09/02/17 1120    Clinical Impression Statement  A: Reassessment completed this date, pt reports improvement in activity tolerance during daily and work tasks. Pain has decreased at lateral epicondyle, however pt continue to experience soreness and weaknes along forearm. Pt has improved strength in some areas, grip and 3 point pinch have actually decreased. Upon review no grip/pinch strength goals were established at initial evaluation. Discussed reassessment with pt who would like to continue with therapy working on left elbow/wrist strength and decreasing pain. Also added goal for grip/pinch strength and  initiated exercises today.     Occupational Profile and client history currently impacting functional performance  Patient was independent prior to injury and wants to be able to continue with lifting tasks at work.    Occupational performance deficits (Please refer to evaluation for details):  ADL's;IADL's;Rest and Sleep;Work    Neurosurgeon    Current Impairments/barriers affecting progress:  comorbid conditions; hx of carpal tunnel in right hand    OT Frequency  1x / week    OT Duration  4 weeks    OT Treatment/Interventions  Self-care/ADL training;Moist Heat;Compression bandaging;Therapeutic activities;Ultrasound;Therapeutic exercise;Cryotherapy;Passive range of motion;Electrical Stimulation;Paraffin;Energy conservation;Manual Therapy;Patient/family education    Plan  P: Continue with OT services working to improve pt's pain, strength, and functional use of LUE during daily and work tasks.        Patient will benefit from skilled therapeutic intervention in order to improve the following deficits and impairments:  Increased edema, Increased fascial restrictions, Decreased strength, Impaired UE functional use, Decreased activity tolerance, Pain  Visit Diagnosis: Pain in left elbow  Other symptoms and signs involving the musculoskeletal system    Problem List Patient Active Problem List   Diagnosis Date Noted  . Abdominal pain 08/18/2017  . Nausea without vomiting 11/17/2016  . Current use of long term anticoagulation 11/12/2016  . Atrial fibrillation with RVR ([place]) 11/07/2016  . Coronary artery disease due to lipid rich plaque   . RLQ abdominal pain 10/03/2016  . Yeast infection 03/13/2016  . Fibroids 03/13/2016  . Screening for colorectal cancer 11/28/2015  . Burning with urination 11/28/2015  . Subacute frontal sinusitis 11/28/2015  . Leg cramps 10/31/2015  . Chest pain 10/31/2015  . Varicose 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 69/62/9528  . Body  aches 11/21/2014  . Constipation 11/21/2014  . Vaginal itching 03/24/2014  . Vaginal discharge 03/24/2014  . Pelvic pain 11/02/2013  . Elevated cholesterol 11/02/2013  . Low back pain 10/20/2013  . Stiffness of ankle joint 10/20/2013  . Pain in joint, ankle and foot 10/20/2013  . Vaginal irritation 05/23/2013  . Hematuria 05/23/2013  . BV (bacterial vaginosis) 05/23/2013  . HEMATOCHEZIA 12/05/2009  . Dysphagia 12/05/2009  . CHEST WALL PAIN, ANTERIOR 06/23/2007  . LEG PAIN 05/18/2007  . VAGINAL PRURITUS 04/16/2007  . GOITER 03/10/2007  . HYPOTHYROIDISM 03/10/2007  . DIABETES MELLITUS, TYPE II 03/10/2007  . HYPERLIPIDEMIA 03/10/2007  . ANEMIA-IRON DEFICIENCY 03/10/2007  . Essential hypertension 03/10/2007  . RHINITIS, CHRONIC 03/10/2007  . ASTHMA, CHILDHOOD 03/10/2007  . GERD 03/10/2007  . PEPTIC ULCER DISEASE 03/10/2007  . MENOPAUSAL SYNDROME 03/10/2007   Guadelupe Sabin, OTR/L  [phone] 09/02/2017, 11:28 AM  [place], [place], [address] Phone: [phone]   Fax:  [phone]  Name: Becky [name] MRN: [id number] Date of Birth: [birth year]

## 2017-09-02 NOTE — Therapy (Signed)
[place], [place], [address] Phone: [phone]   Fax:  [phone]  Physical Therapy Treatment  Patient Details  Name: Becky [name] MRN: [id number] Date of Birth: 01-04-1961 Referring Provider: [name], MD   Encounter Date: 09/02/2017  PT End of Session - 09/02/17 1135    Visit Number  2    Number of Visits  9    Date for PT Re-Evaluation  09/23/17    Authorization Type  [employer] (no auth required, no visit limit)    Authorization Time Period  08/26/2017 - 09/25/2017    Authorization - Visit Number  2    Authorization - Number of Visits  10    PT Start Time  1120    PT Stop Time  1205    PT Time Calculation (min)  45 min    Activity Tolerance  Patient tolerated treatment well    Behavior During Therapy  [facility] for tasks assessed/performed       Past Medical History:  Diagnosis Date  . Back pain   . Body aches 11/21/2014  . BV (bacterial vaginosis) 05/23/2013  . Constipation 11/21/2014  . Elevated cholesterol 11/02/2013  . Fibroids 03/13/2016  . Hematuria 05/23/2013  . Hyperlipidemia   . Hypertension   . Migraines   . PAF (paroxysmal atrial fibrillation) ([place])    a. diagnosed in 11/2016 --> started on Xarelto for anticoagulation  . Pelvic pain in female 11/02/2013  . Plantar fasciitis of right foot   . Thyroid disease   . Vaginal discharge 03/24/2014  . Vaginal irritation 05/23/2013  . Vaginal itching 03/24/2014    Past Surgical History:  Procedure Laterality Date  . ECTOPIC PREGNANCY SURGERY    . ESOPHAGOGASTRODUODENOSCOPY  12/19/2009   DXI:[id number] stricture s/p dilation/mild gastritis  . ESOPHAGOGASTRODUODENOSCOPY N/A 12/10/2015   Procedure: ESOPHAGOGASTRODUODENOSCOPY (EGD);  Surgeon: Danie Binder, MD;  [facility];  Service: Endoscopy;  Laterality: N/A;  830  . ileocolonoscopy  12/19/2009   [id number] polyps/mild left-side diverticulosis/hemorrhoids  . KNEE SURGERY    .  TUBAL LIGATION      There were no vitals filed for this visit.  Subjective Assessment - 09/02/17 1125    Subjective  Pt stated she has more pain in the shoulder rather than the neck today, pain scale 5/10 posterior Rt>Lt tight and achey pain today.      Patient Stated Goals  to have less pain/stiffness in neck during work    Currently in Pain?  Yes    Pain Score  5     Pain Location  Neck    Pain Orientation  Posterior;Right    Pain Descriptors / Indicators  Aching;Dull;Tightness    Pain Type  Chronic pain    Pain Onset  More than a month ago    Pain Frequency  Intermittent    Aggravating Factors   lifting things, sitting or standing in same place/position for prolonged periods of time    Pain Relieving Factors  heat    Effect of Pain on Daily Activities  moderate effect                       [facility] Adult PT Treatment/Exercise - 09/02/17 1138      Exercises   Exercises  Neck        Neck Exercises: Seated   Neck Retraction  10 reps;3 secs    Neck Retraction Limitations  cueing for mechanics and  to reduce UT activaiton    Cervical Rotation  Both;10 reps;Limitations    Cervical Rotation Limitations  cueing for mechanics and to reduce UT activaiton    Lateral Flexion  Right;Left;10 reps    Lateral Flexion Limitations  cueing to relax UT    W Back  10 reps    Other Seated Exercise  3D cervical excursion    Other Seated Exercise  scapula retraction      Hand Exercises     Manual Therapy   Manual Therapy  Soft tissue mobilization    Manual therapy comments  manual complete prior to exercises    Soft tissue mobilization  Supine position with LE elevated; STM focus on upper trap, cervical paraspinals, included suboccipital release and fascial to reduce headache      Neck Exercises: Stretches   Upper Trapezius Stretch  Right;Left;2 reps;30 seconds             PT Education - 09/02/17 1136    Education Details  Reviewed goals, assured complaince with cueing  to improve mechanics and copy of eval given to pt.      Person(s) Educated  Patient    Methods  Explanation;Handout;Demonstration    Comprehension  Verbalized understanding;Returned demonstration;Tactile cues required;Need further instruction;Verbal cues required   demonstration wiht cervical retraction and cueing to relax UT during exercise      PT Short Term Goals - 08/26/17 1706      PT SHORT TERM GOAL #1   Title  Patient will be independent with HEP, updated PRN, to improve postural strength and cervical ROM, as well as activity tolerance with work activities.    Time  2    Period  Weeks    Status  New    Target Date  09/09/17      PT SHORT TERM GOAL #2   Title  Patient will improve Cervical Rotation by 8 degrees towards Rt to demonstrate significant improvement in cervical mobility.    Time  2    Period  Weeks    Status  New        PT Long Term Goals - 08/26/17 1711      PT LONG TERM GOAL #1   Title  Patient will improve all limited cervical ROM to WNL's and Rt cervical rotation to 75 degrees or greater.    Time  4    Period  Weeks    Status  New    Target Date  09/23/17      PT LONG TERM GOAL #2   Title  Patient will improve bil UE strength by 1/2 grade for all limited groups to improve functional strength for greater endurance and activity tolerance at work.    Time  4    Period  Weeks    Status  New      PT LONG TERM GOAL #3   Title  Patient will demonstrate proper lifting mechanics for floor to overhead with 25 lbs and floor to waist with 40 lbs to improve mechanics with increased load for safe mobility at work to reduce injury risk.    Time  4    Period  Weeks    Status  New      PT LONG TERM GOAL #4   Title  Patient will sleep through the night without waking up with neck pain for 1 week to demonstrate improve mobility and tolerance to sustained positions with cervical spine.    Time  4  Period  Weeks    Status  New            Plan - 09/02/17  1141    Clinical Impression Statement  Reviewed goals and copy of eval given to pt.  Reviewed mechanics with HEP with multimodal cueing to improve cervical retraction and relax UT to reduce compensation assistance.  Session focus on cervical mobility and postural strengthening.  Added upper trapezius stretches for mobility and continued with cervical/scapular retraction strengthening exercises.  EOS with manual soft tissue mobilization to address restrictions.  Pt presents with moderate tightness Bil upper traps, cervical paraspinals and levator scapula, reports of headache behind Lt eye, added fascial to assist with headache relief and encouraged hydration following manual.  EOS pt reports pain reduced to 3/10.    Rehab Potential  Good    PT Frequency  2x / week    PT Duration  4 weeks    PT Treatment/Interventions  ADLs/Self Care Home Management;Cryotherapy;Electrical Stimulation;Moist Heat;Traction;Functional mobility training;Therapeutic activities;Therapeutic exercise;Balance training;Neuromuscular re-education;Patient/family education;Manual techniques;Passive range of motion;Dry needling;Energy conservation;Taping    PT Next Visit Plan  Review form with HEP and initiate cervical spine mobilization if with PT. Perform soft tissue work to bil upper trap, cervical paraspinals,     PT Home Exercise Plan  eval: cervical excursion, chin tucks       Patient will benefit from skilled therapeutic intervention in order to improve the following deficits and impairments:  Decreased endurance, Decreased mobility, Increased muscle spasms, Improper body mechanics, Decreased range of motion, Decreased activity tolerance, Decreased strength, Increased fascial restricitons, Impaired flexibility, Pain, Postural dysfunction  Visit Diagnosis: Cervicalgia  Abnormal posture  Muscle weakness (generalized)     Problem List Patient Active Problem List   Diagnosis Date Noted  . Abdominal pain 08/18/2017  .  Nausea without vomiting 11/17/2016  . Current use of long term anticoagulation 11/12/2016  . Atrial fibrillation with RVR ([place]) 11/07/2016  . Coronary artery disease due to lipid rich plaque   . RLQ abdominal pain 10/03/2016  . Yeast infection 03/13/2016  . Fibroids 03/13/2016  . Screening for colorectal cancer 11/28/2015  . Burning with urination 11/28/2015  . Subacute frontal sinusitis 11/28/2015  . Leg cramps 10/31/2015  . Chest pain 10/31/2015  . Varicose 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 42/70/6237  . Body aches 11/21/2014  . Constipation 11/21/2014  . Vaginal itching 03/24/2014  . Vaginal discharge 03/24/2014  . Pelvic pain 11/02/2013  . Elevated cholesterol 11/02/2013  . Low back pain 10/20/2013  . Stiffness of ankle joint 10/20/2013  . Pain in joint, ankle and foot 10/20/2013  . Vaginal irritation 05/23/2013  . Hematuria 05/23/2013  . BV (bacterial vaginosis) 05/23/2013  . HEMATOCHEZIA 12/05/2009  . Dysphagia 12/05/2009  . CHEST WALL PAIN, ANTERIOR 06/23/2007  . LEG PAIN 05/18/2007  . VAGINAL PRURITUS 04/16/2007  . GOITER 03/10/2007  . HYPOTHYROIDISM 03/10/2007  . DIABETES MELLITUS, TYPE II 03/10/2007  . HYPERLIPIDEMIA 03/10/2007  . ANEMIA-IRON DEFICIENCY 03/10/2007  . Essential hypertension 03/10/2007  . RHINITIS, CHRONIC 03/10/2007  . ASTHMA, CHILDHOOD 03/10/2007  . GERD 03/10/2007  . PEPTIC ULCER DISEASE 03/10/2007  . MENOPAUSAL SYNDROME 03/10/2007   Ihor Austin, [place]; [place]  Aldona Lento 09/02/2017, 12:13 PM  [place], [place], [address] Phone: [phone]   Fax:  [phone]  Name: Becky [name] MRN: [id number] Date of Birth: Dec 07, 1960

## 2017-09-05 DIAGNOSIS — M7541 Impingement syndrome of right shoulder: Secondary | ICD-10-CM | POA: Diagnosis not present

## 2017-09-08 ENCOUNTER — Encounter (HOSPITAL_COMMUNITY): Payer: Self-pay | Admitting: Physical Therapy

## 2017-09-08 ENCOUNTER — Ambulatory Visit (HOSPITAL_COMMUNITY): Payer: BLUE CROSS/BLUE SHIELD | Attending: Orthopedic Surgery | Admitting: Physical Therapy

## 2017-09-08 DIAGNOSIS — M6281 Muscle weakness (generalized): Secondary | ICD-10-CM | POA: Insufficient documentation

## 2017-09-08 DIAGNOSIS — M25522 Pain in left elbow: Secondary | ICD-10-CM | POA: Diagnosis not present

## 2017-09-08 DIAGNOSIS — R293 Abnormal posture: Secondary | ICD-10-CM | POA: Diagnosis not present

## 2017-09-08 DIAGNOSIS — R29898 Other symptoms and signs involving the musculoskeletal system: Secondary | ICD-10-CM | POA: Diagnosis not present

## 2017-09-08 DIAGNOSIS — M542 Cervicalgia: Secondary | ICD-10-CM | POA: Diagnosis not present

## 2017-09-08 NOTE — Therapy (Signed)
[place], [place], [address] Phone: [phone]   Fax:  [phone]  Physical Therapy Treatment  Patient Details  Name: Becky [name] MRN: [id number] Date of Birth: May 26, 1960 Referring Provider: [name], MD   Encounter Date: 09/08/2017  PT End of Session - 09/08/17 1136    Visit Number  3    Number of Visits  9    Date for PT Re-Evaluation  09/23/17    Authorization Type  [employer] (no auth required, no visit limit)    Authorization Time Period  08/26/2017 - 09/25/2017    Authorization - Visit Number  3    Authorization - Number of Visits  10    PT Start Time  1116    PT Stop Time  1154    PT Time Calculation (min)  38 min    Activity Tolerance  Patient tolerated treatment well    Behavior During Therapy  [facility] for tasks assessed/performed       Past Medical History:  Diagnosis Date  . Back pain   . Body aches 11/21/2014  . BV (bacterial vaginosis) 05/23/2013  . Constipation 11/21/2014  . Elevated cholesterol 11/02/2013  . Fibroids 03/13/2016  . Hematuria 05/23/2013  . Hyperlipidemia   . Hypertension   . Migraines   . PAF (paroxysmal atrial fibrillation) ([place])    a. diagnosed in 11/2016 --> started on Xarelto for anticoagulation  . Pelvic pain in female 11/02/2013  . Plantar fasciitis of right foot   . Thyroid disease   . Vaginal discharge 03/24/2014  . Vaginal irritation 05/23/2013  . Vaginal itching 03/24/2014    Past Surgical History:  Procedure Laterality Date  . ECTOPIC PREGNANCY SURGERY    . ESOPHAGOGASTRODUODENOSCOPY  12/19/2009   INO:MVEHMC stricture s/p dilation/mild gastritis  . ESOPHAGOGASTRODUODENOSCOPY N/A 12/10/2015   Procedure: ESOPHAGOGASTRODUODENOSCOPY (EGD);  Surgeon: Danie Binder, MD;  [facility];  Service: Endoscopy;  Laterality: N/A;  830  . ileocolonoscopy  12/19/2009   NOB:[id number] polyps/mild left-side diverticulosis/hemorrhoids  . KNEE SURGERY    .  TUBAL LIGATION      There were no vitals filed for this visit.  Subjective Assessment - 09/08/17 1130    Subjective  Patient denied any pain at start of session. She reported that she did not have time to do her exercises.     Patient Stated Goals  to have less pain/stiffness in neck during work    Currently in Pain?  No/denies                       [facility] Adult PT Treatment/Exercise - 09/08/17 0001      Neck Exercises: Standing   UE Flexion with Stabilization Limitations  15 x with cues to stop when neck position is lost    Other Standing Exercises  Rolling ball on wall to improve scapular stabilization x 10 CW/CCW each upper extremity      Neck Exercises: Seated   Neck Retraction  10 reps;3 secs    Neck Retraction Limitations  cueing for mechanics and to reduce UT activaiton    Cervical Rotation  Both;10 reps;Limitations    Cervical Rotation Limitations  cueing for mechanics and to reduce UT activaiton    Lateral Flexion  Right;Left;10 reps    Lateral Flexion Limitations  cueing to relax UT    W Back  10 reps    Other Seated Exercise  Cervical flexion/extension x 10. Seated rows  2x15 with cues for form. Posterior shoulder rolls x10 to promote proper posture    Other Seated Exercise  scapula retraction x 15      Manual Therapy   Manual Therapy  Soft tissue mobilization    Manual therapy comments  manual complete prior to exercises and separate from all other skilled interventions    Soft tissue mobilization  Seated STM focus on upper trap, cervical paraspinals to promote relaxation      Neck Exercises: Stretches   Upper Trapezius Stretch  Right;Left;30 seconds;3 reps               PT Short Term Goals - 08/26/17 1706      PT SHORT TERM GOAL #1   Title  Patient will be independent with HEP, updated PRN, to improve postural strength and cervical ROM, as well as activity tolerance with work activities.    Time  2    Period  Weeks    Status  New     Target Date  09/09/17      PT SHORT TERM GOAL #2   Title  Patient will improve Cervical Rotation by 8 degrees towards Rt to demonstrate significant improvement in cervical mobility.    Time  2    Period  Weeks    Status  New        PT Long Term Goals - 08/26/17 1711      PT LONG TERM GOAL #1   Title  Patient will improve all limited cervical ROM to WNL's and Rt cervical rotation to 75 degrees or greater.    Time  4    Period  Weeks    Status  New    Target Date  09/23/17      PT LONG TERM GOAL #2   Title  Patient will improve bil UE strength by 1/2 grade for all limited groups to improve functional strength for greater endurance and activity tolerance at work.    Time  4    Period  Weeks    Status  New      PT LONG TERM GOAL #3   Title  Patient will demonstrate proper lifting mechanics for floor to overhead with 25 lbs and floor to waist with 40 lbs to improve mechanics with increased load for safe mobility at work to reduce injury risk.    Time  4    Period  Weeks    Status  New      PT LONG TERM GOAL #4   Title  Patient will sleep through the night without waking up with neck pain for 1 week to demonstrate improve mobility and tolerance to sustained positions with cervical spine.    Time  4    Period  Weeks    Status  New            Plan - 09/08/17 1201    Clinical Impression Statement  This session continued with established plan of care. Began session with soft tissue mobilization to promote relaxation to improve form with exercises. This session progressed patient to standing exercises with neck retraction with upper extremity flexion and stabilization. This session also added rolling the ball on the wall clockwise and counterclockwise in order to improve patient's scapular muscle stabilization. Also added posterior shoulder rolls this session to improve patient's awareness of posture and promote relaxation. Patient would benefit from continued skilled physical  therapy in order to continue progressing towards functional goals.     Rehab Potential  Good    PT Frequency  2x / week    PT Duration  4 weeks    PT Treatment/Interventions  ADLs/Self Care Home Management;Cryotherapy;Electrical Stimulation;Moist Heat;Traction;Functional mobility training;Therapeutic activities;Therapeutic exercise;Balance training;Neuromuscular re-education;Patient/family education;Manual techniques;Passive range of motion;Dry needling;Energy conservation;Taping    PT Next Visit Plan  Continue to review form with HEP and initiate cervical spine mobilization if with PT. Perform soft tissue work to bil upper trap, cervical paraspinals    PT Home Exercise Plan  eval: cervical excursion, chin tucks       Patient will benefit from skilled therapeutic intervention in order to improve the following deficits and impairments:  Decreased endurance, Decreased mobility, Increased muscle spasms, Improper body mechanics, Decreased range of motion, Decreased activity tolerance, Decreased strength, Increased fascial restricitons, Impaired flexibility, Pain, Postural dysfunction  Visit Diagnosis: Cervicalgia  Abnormal posture  Muscle weakness (generalized)     Problem List Patient Active Problem List   Diagnosis Date Noted  . Abdominal pain 08/18/2017  . Nausea without vomiting 11/17/2016  . Current use of long term anticoagulation 11/12/2016  . Atrial fibrillation with RVR ([place]) 11/07/2016  . Coronary artery disease due to lipid rich plaque   . RLQ abdominal pain 10/03/2016  . Yeast infection 03/13/2016  . Fibroids 03/13/2016  . Screening for colorectal cancer 11/28/2015  . Burning with urination 11/28/2015  . Subacute frontal sinusitis 11/28/2015  . Leg cramps 10/31/2015  . Chest pain 10/31/2015  . Varicose 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 38/18/2993  . Body aches 11/21/2014  . Constipation 11/21/2014  . Vaginal itching 03/24/2014  . Vaginal discharge  03/24/2014  . Pelvic pain 11/02/2013  . Elevated cholesterol 11/02/2013  . Low back pain 10/20/2013  . Stiffness of ankle joint 10/20/2013  . Pain in joint, ankle and foot 10/20/2013  . Vaginal irritation 05/23/2013  . Hematuria 05/23/2013  . BV (bacterial vaginosis) 05/23/2013  . HEMATOCHEZIA 12/05/2009  . Dysphagia 12/05/2009  . CHEST WALL PAIN, ANTERIOR 06/23/2007  . LEG PAIN 05/18/2007  . VAGINAL PRURITUS 04/16/2007  . GOITER 03/10/2007  . HYPOTHYROIDISM 03/10/2007  . DIABETES MELLITUS, TYPE II 03/10/2007  . HYPERLIPIDEMIA 03/10/2007  . ANEMIA-IRON DEFICIENCY 03/10/2007  . Essential hypertension 03/10/2007  . RHINITIS, CHRONIC 03/10/2007  . ASTHMA, CHILDHOOD 03/10/2007  . GERD 03/10/2007  . PEPTIC ULCER DISEASE 03/10/2007  . MENOPAUSAL SYNDROME 03/10/2007   Clarene Critchley PT, DPT 12:04 PM, 09/08/17 [place], [place], [address] Phone: [phone]   Fax:  [phone]  Name: Becky [name] MRN: [id number] Date of Birth: Oct 13, 1960

## 2017-09-09 ENCOUNTER — Ambulatory Visit (HOSPITAL_COMMUNITY): Payer: BLUE CROSS/BLUE SHIELD

## 2017-09-09 ENCOUNTER — Encounter (HOSPITAL_COMMUNITY): Payer: Self-pay

## 2017-09-09 ENCOUNTER — Other Ambulatory Visit: Payer: Self-pay

## 2017-09-09 DIAGNOSIS — R293 Abnormal posture: Secondary | ICD-10-CM

## 2017-09-09 DIAGNOSIS — R29898 Other symptoms and signs involving the musculoskeletal system: Secondary | ICD-10-CM

## 2017-09-09 DIAGNOSIS — M542 Cervicalgia: Secondary | ICD-10-CM | POA: Diagnosis not present

## 2017-09-09 DIAGNOSIS — M6281 Muscle weakness (generalized): Secondary | ICD-10-CM

## 2017-09-09 DIAGNOSIS — M25522 Pain in left elbow: Secondary | ICD-10-CM

## 2017-09-09 NOTE — Therapy (Signed)
[place], [place], [address] Phone: [phone]   Fax:  [phone]  Physical Therapy Treatment  Patient Details  Name: Becky [name] MRN: [id number] Date of Birth: November 25, 1960 Referring Provider: [name], MD   Encounter Date: 09/09/2017  PT End of Session - 09/09/17 0934    Visit Number  4    Number of Visits  9    Date for PT Re-Evaluation  09/23/17    Authorization Type  [employer] (no auth required, no visit limit)    Authorization Time Period  08/26/2017 - 09/25/2017    Authorization - Visit Number  4    Authorization - Number of Visits  10    PT Start Time  0904    PT Stop Time  0945    PT Time Calculation (min)  41 min    Activity Tolerance  Patient tolerated treatment well    Behavior During Therapy  [facility] for tasks assessed/performed       Past Medical History:  Diagnosis Date  . Back pain   . Body aches 11/21/2014  . BV (bacterial vaginosis) 05/23/2013  . Constipation 11/21/2014  . Elevated cholesterol 11/02/2013  . Fibroids 03/13/2016  . Hematuria 05/23/2013  . Hyperlipidemia   . Hypertension   . Migraines   . PAF (paroxysmal atrial fibrillation) ([place])    a. diagnosed in 11/2016 --> started on Xarelto for anticoagulation  . Pelvic pain in female 11/02/2013  . Plantar fasciitis of right foot   . Thyroid disease   . Vaginal discharge 03/24/2014  . Vaginal irritation 05/23/2013  . Vaginal itching 03/24/2014    Past Surgical History:  Procedure Laterality Date  . ECTOPIC PREGNANCY SURGERY    . ESOPHAGOGASTRODUODENOSCOPY  12/19/2009   PIR:JJOACZ stricture s/p dilation/mild gastritis  . ESOPHAGOGASTRODUODENOSCOPY N/A 12/10/2015   Procedure: ESOPHAGOGASTRODUODENOSCOPY (EGD);  Surgeon: Danie Binder, MD;  [facility];  Service: Endoscopy;  Laterality: N/A;  830  . ileocolonoscopy  12/19/2009   YSA:[id number] polyps/mild left-side diverticulosis/hemorrhoids  . KNEE SURGERY    .  TUBAL LIGATION      There were no vitals filed for this visit.  Subjective Assessment - 09/09/17 0923    Subjective  Patient reports she has tried her exercises and is not having difficulty or pain with them.     Limitations  Sitting;Lifting;Standing;House hold activities    How long can you sit comfortably?  3-4 hours    How long can you stand comfortably?  3-4 hours    How long can you walk comfortably?  3-4 hours     Patient Stated Goals  to have less pain/stiffness in neck during work    Currently in Pain?  No/denies        [facility] Adult PT Treatment/Exercise - 09/09/17 0001      Exercises   Exercises  Neck      Neck Exercises: Standing   Other Standing Exercises  Rolling ball on wall to improve scapular stabilization 1x 1 minute CW/CCW each upper extremity    Other Standing Exercises  wall arch: 1x 10 reps      Neck Exercises: Seated   Neck Retraction  15 reps;3 secs    Neck Retraction Limitations  cueing for mechanics and to reduce UT activaiton    Cervical Rotation  Both;10 reps;Limitations    Cervical Rotation Limitations  cueing for mechanics and to reduce UT activaiton    Lateral Flexion  Right;Left;10 reps  Lateral Flexion Limitations  cueing to relax UT    W Back  15 reps;Limitations    W Back Limitations  3 second holds    Shoulder Rolls  Backwards;15 reps    Shoulder Rolls Limitations  performed in standing    Other Seated Exercise  Cervical flexion/extension x 10. Seated rows 2x15 with cues for form. Posterior shoulder rolls x10 to promote proper posture      Neck Exercises: Supine   Other Supine Exercise  Serratus punch: 1x 15 reps, bil UE, 1lb      Manual Therapy   Manual Therapy  Soft tissue mobilization;Joint mobilization    Manual therapy comments  manual complete prior to exercises and separate from all other skilled interventions    Joint Mobilization  3x 30-45 seconds grade III PA's to Cervical spine C2-7    Soft tissue mobilization  patient in  prone, soft tissue mobilization to cervical paraspinals and upper trapezius bil        PT Education - 09/09/17 0936    Education Details  Edcuated on exercise and provided cues for proper form with chin tucks.     Person(s) Educated  Patient    Methods  Explanation;Demonstration    Comprehension  Verbalized understanding;Returned demonstration       PT Short Term Goals - 09/09/17 0959      PT SHORT TERM GOAL #1   Title  Patient will be independent with HEP, updated PRN, to improve postural strength and cervical ROM, as well as activity tolerance with work activities.    Time  2    Period  Weeks    Status  On-going      PT SHORT TERM GOAL #2   Title  Patient will improve Cervical Rotation by 8 degrees towards Rt to demonstrate significant improvement in cervical mobility.    Time  2    Period  Weeks    Status  On-going        PT Long Term Goals - 09/09/17 0959      PT LONG TERM GOAL #1   Title  Patient will improve all limited cervical ROM to WNL's and Rt cervical rotation to 75 degrees or greater.    Time  4    Period  Weeks    Status  On-going      PT LONG TERM GOAL #2   Title  Patient will improve bil UE strength by 1/2 grade for all limited groups to improve functional strength for greater endurance and activity tolerance at work.    Time  4    Period  Weeks    Status  On-going      PT LONG TERM GOAL #3   Title  Patient will demonstrate proper lifting mechanics for floor to overhead with 25 lbs and floor to waist with 40 lbs to improve mechanics with increased load for safe mobility at work to reduce injury risk.    Time  4    Period  Weeks    Status  On-going      PT LONG TERM GOAL #4   Title  Patient will sleep through the night without waking up with neck pain for 1 week to demonstrate improve mobility and tolerance to sustained positions with cervical spine.    Time  4    Period  Weeks    Status  On-going        Plan - 09/09/17 0934    Clinical  Impression Statement  Therapy  initiated with cervical spine mobilization and soft tissue mobilization to improve mobility and facilitate relaxation for improved posture. Patient continued with ROM exercises and scapular stabilization strengthening with addition of serratus punch. She requires verbal/tactile cues to achieve proper cervical spine retraction to strengthen deep cervical flexors and will continue to benefit from review for HEP. Patient will continue to benefit from skilled physical therapy in order to continue progressing towards functional goals.    Rehab Potential  Good    PT Frequency  2x / week    PT Duration  4 weeks    PT Treatment/Interventions  ADLs/Self Care Home Management;Cryotherapy;Electrical Stimulation;Moist Heat;Traction;Functional mobility training;Therapeutic activities;Therapeutic exercise;Balance training;Neuromuscular re-education;Patient/family education;Manual techniques;Passive range of motion;Dry needling;Energy conservation;Taping    PT Next Visit Plan  Continue to review cervical chin tucks for form and continue cervical spine mobilization if with PT. Perform soft tissue work to bil upper trap, cervical paraspinals. Add theraband posture exercises next session.    PT Home Exercise Plan  eval: cervical excursion, chin tucks    Consulted and Agree with Plan of Care  Patient       Patient will benefit from skilled therapeutic intervention in order to improve the following deficits and impairments:  Decreased endurance, Decreased mobility, Increased muscle spasms, Improper body mechanics, Decreased range of motion, Decreased activity tolerance, Decreased strength, Increased fascial restricitons, Impaired flexibility, Pain, Postural dysfunction  Visit Diagnosis: Cervicalgia  Abnormal posture  Muscle weakness (generalized)     Problem List Patient Active Problem List   Diagnosis Date Noted  . Abdominal pain 08/18/2017  . Nausea without vomiting 11/17/2016   . Current use of long term anticoagulation 11/12/2016  . Atrial fibrillation with RVR ([place]) 11/07/2016  . Coronary artery disease due to lipid rich plaque   . RLQ abdominal pain 10/03/2016  . Yeast infection 03/13/2016  . Fibroids 03/13/2016  . Screening for colorectal cancer 11/28/2015  . Burning with urination 11/28/2015  . Subacute frontal sinusitis 11/28/2015  . Leg cramps 10/31/2015  . Chest pain 10/31/2015  . Varicose 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 19/62/2297  . Body aches 11/21/2014  . Constipation 11/21/2014  . Vaginal itching 03/24/2014  . Vaginal discharge 03/24/2014  . Pelvic pain 11/02/2013  . Elevated cholesterol 11/02/2013  . Low back pain 10/20/2013  . Stiffness of ankle joint 10/20/2013  . Pain in joint, ankle and foot 10/20/2013  . Vaginal irritation 05/23/2013  . Hematuria 05/23/2013  . BV (bacterial vaginosis) 05/23/2013  . HEMATOCHEZIA 12/05/2009  . Dysphagia 12/05/2009  . CHEST WALL PAIN, ANTERIOR 06/23/2007  . LEG PAIN 05/18/2007  . VAGINAL PRURITUS 04/16/2007  . GOITER 03/10/2007  . HYPOTHYROIDISM 03/10/2007  . DIABETES MELLITUS, TYPE II 03/10/2007  . HYPERLIPIDEMIA 03/10/2007  . ANEMIA-IRON DEFICIENCY 03/10/2007  . Essential hypertension 03/10/2007  . RHINITIS, CHRONIC 03/10/2007  . ASTHMA, CHILDHOOD 03/10/2007  . GERD 03/10/2007  . PEPTIC ULCER DISEASE 03/10/2007  . MENOPAUSAL SYNDROME 03/10/2007    Kipp Brood, PT, DPT Physical Therapist with [facility]  09/09/2017 9:48 AM    [place], [place], [address] Phone: [phone]   Fax:  [phone]  Name: Kayliana Codd MRN: [id number] Date of Birth: 05/17/1960

## 2017-09-09 NOTE — Therapy (Signed)
[place], [place], [address] Phone: [phone]   Fax:  [phone]  Occupational Therapy Treatment  Patient Details  Name: Becky [name] MRN: [id number] Date of Birth: July 18, 1960 Referring Provider: [name], MD   Encounter Date: 09/09/2017  OT End of Session - 09/09/17 1042    Visit Number  5    Number of Visits  8    Date for OT Re-Evaluation  10/02/17    Authorization Type  Primary is [employer] - $25 copay and visits based on medical necessity; secondary is also [place] may be covered    Authorization Time Period  visits based on medical necessity    OT Start Time  0950    OT Stop Time  1028    OT Time Calculation (min)  38 min    Activity Tolerance  Patient tolerated treatment well    Behavior During Therapy  [facility] for tasks assessed/performed       Past Medical History:  Diagnosis Date  . Back pain   . Body aches 11/21/2014  . BV (bacterial vaginosis) 05/23/2013  . Constipation 11/21/2014  . Elevated cholesterol 11/02/2013  . Fibroids 03/13/2016  . Hematuria 05/23/2013  . Hyperlipidemia   . Hypertension   . Migraines   . PAF (paroxysmal atrial fibrillation) ([place])    a. diagnosed in 11/2016 --> started on Xarelto for anticoagulation  . Pelvic pain in female 11/02/2013  . Plantar fasciitis of right foot   . Thyroid disease   . Vaginal discharge 03/24/2014  . Vaginal irritation 05/23/2013  . Vaginal itching 03/24/2014    Past Surgical History:  Procedure Laterality Date  . ECTOPIC PREGNANCY SURGERY    . ESOPHAGOGASTRODUODENOSCOPY  12/19/2009   TDD:UKGURK stricture s/p dilation/mild gastritis  . ESOPHAGOGASTRODUODENOSCOPY N/A 12/10/2015   Procedure: ESOPHAGOGASTRODUODENOSCOPY (EGD);  Surgeon: Danie Binder, MD;  [facility];  Service: Endoscopy;  Laterality: N/A;  830  . ileocolonoscopy  12/19/2009   YHC:[id number] polyps/mild left-side diverticulosis/hemorrhoids  . KNEE  SURGERY    . TUBAL LIGATION      There were no vitals filed for this visit.  Subjective Assessment - 09/09/17 1002    Subjective   S: It doesn't hurt right now.     Currently in Pain?  No/denies         [facility] OT Assessment - 09/09/17 1003      Assessment   Medical Diagnosis  left elbow pain    Referring Provider  [name], MD      Precautions   Precautions  None               OT Treatments/Exercises (OP) - 09/09/17 1003      Exercises   Exercises  Elbow;Wrist;Hand;Theraputty      Additional Elbow Exercises   Sponges  32 (all). Patient picked up 30 high resistance sponges utilizing a 3 point pinch in left hand. Started with green resistive clothespin and transitioned down to green if needed.     Hand Gripper with Large Beads  All beads gripper at 29#    Hand Gripper with Medium Beads  All beads gripper at 29#    Hand Gripper with Small Beads  all beads with gripper set at 25#      Wrist Exercises   Wrist Flexion  Theraband;10 reps    Theraband Level (Wrist Flexion)  Level 2 (Red)    Wrist Extension  Theraband;10 reps    Theraband Level (  Wrist Extension)  Level 2 (Red)    Wrist Radial Deviation  Theraband;10 reps    Theraband Level (Radial Deviation)  Level 2 (Red)      Modalities   Modalities  Cryotherapy      Cryotherapy   Number Minutes Cryotherapy  2 Minutes    Cryotherapy Location  Forearm    Type of Cryotherapy  Ice massage      Manual Therapy   Manual Therapy  Soft tissue mobilization    Manual therapy comments  manual complete prior to exercises and separate from all other skilled interventions    Soft tissue mobilization  myofascial release and trigger point release completed to left lateral epicondyle region to decrease fascial restrictions and pain during functional tasks.                OT Short Term Goals - 09/09/17 1044      OT SHORT TERM GOAL #1   Title  Patient will decrease facsial restrictions in left forearm in order  to decrease pain and increase functional use of the left arm during daily and work activities.    Time  4    Period  Weeks    Status  On-going      OT SHORT TERM GOAL #2   Title  Patient will reduce pain to a 3/10 or less in the LUE while completing daily tasks.    Time  4    Period  Weeks    Status  On-going      OT SHORT TERM GOAL #3   Title  Patient will increase left elbow and wrist strength to 5/5 in order to be able to complete lifting tasks at work with less difficulty.    Time  4    Period  Weeks    Status  On-going      OT SHORT TERM GOAL #4   Title  Pt will increase grip strength in left hand by 15# and tip pinch by 3# to improve ability to hold onto items during daily and work tasks.     Time  4    Period  Weeks    Status  On-going               Plan - 09/09/17 1042    Clinical Impression Statement  A: Min fascial restrictions noted in left lateral epicondyle region although patient reports tenderness and pain upon palpation. Session focused on grip and pinch strengthening as well as wrist strengthening. Patient reports pain at end of session with ice massage completed to address. VC for form and technique during sesison as needed.     Plan  P: Complete manual techniques for fascial restrictions as needed. Continue with grip and pinch strengthening. Add pvc pipe circles with putty.    Consulted and Agree with Plan of Care  Patient       Patient will benefit from skilled therapeutic intervention in order to improve the following deficits and impairments:  Increased edema, Increased fascial restrictions, Decreased strength, Impaired UE functional use, Decreased activity tolerance, Pain  Visit Diagnosis: Pain in left elbow  Other symptoms and signs involving the musculoskeletal system    Problem List Patient Active Problem List   Diagnosis Date Noted  . Abdominal pain 08/18/2017  . Nausea without vomiting 11/17/2016  . Current use of long term  anticoagulation 11/12/2016  . Atrial fibrillation with RVR ([place]) 11/07/2016  . Coronary artery disease due to lipid rich plaque   .  RLQ abdominal pain 10/03/2016  . Yeast infection 03/13/2016  . Fibroids 03/13/2016  . Screening for colorectal cancer 11/28/2015  . Burning with urination 11/28/2015  . Subacute frontal sinusitis 11/28/2015  . Leg cramps 10/31/2015  . Chest pain 10/31/2015  . Varicose 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 44/96/7591  . Body aches 11/21/2014  . Constipation 11/21/2014  . Vaginal itching 03/24/2014  . Vaginal discharge 03/24/2014  . Pelvic pain 11/02/2013  . Elevated cholesterol 11/02/2013  . Low back pain 10/20/2013  . Stiffness of ankle joint 10/20/2013  . Pain in joint, ankle and foot 10/20/2013  . Vaginal irritation 05/23/2013  . Hematuria 05/23/2013  . BV (bacterial vaginosis) 05/23/2013  . HEMATOCHEZIA 12/05/2009  . Dysphagia 12/05/2009  . CHEST WALL PAIN, ANTERIOR 06/23/2007  . LEG PAIN 05/18/2007  . VAGINAL PRURITUS 04/16/2007  . GOITER 03/10/2007  . HYPOTHYROIDISM 03/10/2007  . DIABETES MELLITUS, TYPE II 03/10/2007  . HYPERLIPIDEMIA 03/10/2007  . ANEMIA-IRON DEFICIENCY 03/10/2007  . Essential hypertension 03/10/2007  . RHINITIS, CHRONIC 03/10/2007  . ASTHMA, CHILDHOOD 03/10/2007  . GERD 03/10/2007  . PEPTIC ULCER DISEASE 03/10/2007  . MENOPAUSAL SYNDROME 03/10/2007   Ailene Ravel, OTR/L,CBIS  [phone]  09/09/2017, 10:47 AM  [facility] [address] [place], [place], [address] Phone: [phone]   Fax:  [phone]  Name: Becky [name] MRN: [id number] Date of Birth: 13-Apr-1960

## 2017-09-10 ENCOUNTER — Ambulatory Visit: Payer: BLUE CROSS/BLUE SHIELD | Admitting: Adult Health

## 2017-09-10 DIAGNOSIS — M79631 Pain in right forearm: Secondary | ICD-10-CM | POA: Diagnosis not present

## 2017-09-10 DIAGNOSIS — M79641 Pain in right hand: Secondary | ICD-10-CM | POA: Diagnosis not present

## 2017-09-10 DIAGNOSIS — M79642 Pain in left hand: Secondary | ICD-10-CM | POA: Diagnosis not present

## 2017-09-10 DIAGNOSIS — M47817 Spondylosis without myelopathy or radiculopathy, lumbosacral region: Secondary | ICD-10-CM | POA: Diagnosis not present

## 2017-09-10 DIAGNOSIS — M47812 Spondylosis without myelopathy or radiculopathy, cervical region: Secondary | ICD-10-CM | POA: Diagnosis not present

## 2017-09-10 DIAGNOSIS — M79632 Pain in left forearm: Secondary | ICD-10-CM | POA: Diagnosis not present

## 2017-09-10 DIAGNOSIS — M545 Low back pain: Secondary | ICD-10-CM | POA: Diagnosis not present

## 2017-09-11 ENCOUNTER — Other Ambulatory Visit (HOSPITAL_COMMUNITY): Payer: Self-pay | Admitting: Physical Medicine and Rehabilitation

## 2017-09-11 ENCOUNTER — Ambulatory Visit (HOSPITAL_COMMUNITY)
Admission: RE | Admit: 2017-09-11 | Discharge: 2017-09-11 | Disposition: A | Discharge status: Home / Self Care | Payer: BLUE CROSS/BLUE SHIELD | Source: Ambulatory Visit | Attending: Physical Medicine and Rehabilitation | Admitting: Physical Medicine and Rehabilitation

## 2017-09-11 DIAGNOSIS — M542 Cervicalgia: Secondary | ICD-10-CM | POA: Diagnosis not present

## 2017-09-11 DIAGNOSIS — M545 Low back pain: Secondary | ICD-10-CM

## 2017-09-11 DIAGNOSIS — M5030 Other cervical disc degeneration, unspecified cervical region: Secondary | ICD-10-CM | POA: Diagnosis not present

## 2017-09-15 ENCOUNTER — Ambulatory Visit (HOSPITAL_COMMUNITY): Payer: BLUE CROSS/BLUE SHIELD

## 2017-09-15 ENCOUNTER — Other Ambulatory Visit: Payer: Self-pay

## 2017-09-15 ENCOUNTER — Encounter (HOSPITAL_COMMUNITY): Payer: Self-pay

## 2017-09-15 DIAGNOSIS — M6281 Muscle weakness (generalized): Secondary | ICD-10-CM

## 2017-09-15 DIAGNOSIS — M542 Cervicalgia: Secondary | ICD-10-CM

## 2017-09-15 DIAGNOSIS — R29898 Other symptoms and signs involving the musculoskeletal system: Secondary | ICD-10-CM | POA: Diagnosis not present

## 2017-09-15 DIAGNOSIS — R293 Abnormal posture: Secondary | ICD-10-CM

## 2017-09-15 DIAGNOSIS — M25522 Pain in left elbow: Secondary | ICD-10-CM | POA: Diagnosis not present

## 2017-09-15 NOTE — Therapy (Signed)
[place], [place], [address] Phone: [phone]   Fax:  [phone]  Physical Therapy Treatment  Patient Details  Name: Becky [name] MRN: [id number] Date of Birth: December 10, 1960 Referring Provider: [name], MD   Encounter Date: 09/15/2017  PT End of Session - 09/15/17 1006    Visit Number  5    Number of Visits  9    Date for PT Re-Evaluation  09/23/17    Authorization Type  [employer] (no auth required, no visit limit)    Authorization Time Period  08/26/2017 - 09/25/2017    Authorization - Visit Number  5    Authorization - Number of Visits  10    PT Start Time  [phone]    PT Stop Time  1036    PT Time Calculation (min)  44 min    Activity Tolerance  Patient tolerated treatment well    Behavior During Therapy  [facility] for tasks assessed/performed       Past Medical History:  Diagnosis Date  . Back pain   . Body aches 11/21/2014  . BV (bacterial vaginosis) 05/23/2013  . Constipation 11/21/2014  . Elevated cholesterol 11/02/2013  . Fibroids 03/13/2016  . Hematuria 05/23/2013  . Hyperlipidemia   . Hypertension   . Migraines   . PAF (paroxysmal atrial fibrillation) ([place])    a. diagnosed in 11/2016 --> started on Xarelto for anticoagulation  . Pelvic pain in female 11/02/2013  . Plantar fasciitis of right foot   . Thyroid disease   . Vaginal discharge 03/24/2014  . Vaginal irritation 05/23/2013  . Vaginal itching 03/24/2014    Past Surgical History:  Procedure Laterality Date  . ECTOPIC PREGNANCY SURGERY    . ESOPHAGOGASTRODUODENOSCOPY  12/19/2009   NOM:VEHMCN stricture s/p dilation/mild gastritis  . ESOPHAGOGASTRODUODENOSCOPY N/A 12/10/2015   Procedure: ESOPHAGOGASTRODUODENOSCOPY (EGD);  Surgeon: Danie Binder, MD;  [facility];  Service: Endoscopy;  Laterality: N/A;  830  . ileocolonoscopy  12/19/2009   OBS:[id number] polyps/mild left-side diverticulosis/hemorrhoids  . KNEE SURGERY    .  TUBAL LIGATION      There were no vitals filed for this visit.  Subjective Assessment - 09/15/17 1006    Subjective  Patient denies pain upon arrival and reports her exercises are going well at home. She reports liftin 50lbs bags at work bothers her mid thoracic back.    Limitations  Sitting;Lifting;Standing;House hold activities    How long can you sit comfortably?  3-4 hours    How long can you stand comfortably?  3-4 hours    How long can you walk comfortably?  3-4 hours     Patient Stated Goals  to have less pain/stiffness in neck during work    Currently in Pain?  No/denies        [facility] Adult PT Treatment/Exercise - 09/15/17 0001      Exercises   Exercises  Neck      Neck Exercises: Theraband   Scapula Retraction  Red;10 reps    Shoulder Extension  15 reps;Red    Rows  15 reps;Red      Neck Exercises: Standing   Wall Wash  1x 10 with Bil UE, shoulder flexion to 90/abd 0 degrees and through shoulder flexion to 90/abd 90 degrees     Other Standing Exercises  wall arch: 1x 15 reps      Neck Exercises: Seated   Shoulder Rolls  Backwards;15 reps    Shoulder Rolls Limitations  performed in standing      Neck Exercises: Prone   W Back  15 reps    Other Prone Exercise  I's and T's, 10 reps bil UE      Manual Therapy   Manual Therapy  Joint mobilization;Soft tissue mobilization    Manual therapy comments  manual complete prior to exercises and separate from all other skilled interventions    Joint Mobilization  3x 30-45 seconds grade III PA's to Cervical spine T1-7    Soft tissue mobilization  Seated STM focus on upper trap, cervical paraspinals to promote relaxation      Neck Exercises: Stretches   Upper Trapezius Stretch  Right;Left;30 seconds;2 reps    Corner Stretch  5 reps;10 seconds        PT Education - 09/15/17 1006    Education Details  Educatd on exercises throughout session and on importance of drinking water following soft tissue mobilization to prevent a  headache from release of lactic acid.    Person(s) Educated  Patient    Methods  Explanation;Demonstration    Comprehension  Verbalized understanding;Returned demonstration       PT Short Term Goals - 09/09/17 0959      PT SHORT TERM GOAL #1   Title  Patient will be independent with HEP, updated PRN, to improve postural strength and cervical ROM, as well as activity tolerance with work activities.    Time  2    Period  Weeks    Status  On-going      PT SHORT TERM GOAL #2   Title  Patient will improve Cervical Rotation by 8 degrees towards Rt to demonstrate significant improvement in cervical mobility.    Time  2    Period  Weeks    Status  On-going        PT Long Term Goals - 09/09/17 0959      PT LONG TERM GOAL #1   Title  Patient will improve all limited cervical ROM to WNL's and Rt cervical rotation to 75 degrees or greater.    Time  4    Period  Weeks    Status  On-going      PT LONG TERM GOAL #2   Title  Patient will improve bil UE strength by 1/2 grade for all limited groups to improve functional strength for greater endurance and activity tolerance at work.    Time  4    Period  Weeks    Status  On-going      PT LONG TERM GOAL #3   Title  Patient will demonstrate proper lifting mechanics for floor to overhead with 25 lbs and floor to waist with 40 lbs to improve mechanics with increased load for safe mobility at work to reduce injury risk.    Time  4    Period  Weeks    Status  On-going      PT LONG TERM GOAL #4   Title  Patient will sleep through the night without waking up with neck pain for 1 week to demonstrate improve mobility and tolerance to sustained positions with cervical spine.    Time  4    Period  Weeks    Status  On-going        Plan - 09/15/17 1009    Clinical Impression Statement  This session continued with spine mobilization to improve mobility of joints with focus on thoracic spine this date. Patient denied discomfort with manual  interventions this session.  She progressed postural strengthening with theraband exercises today and with prone W's, I's, and T's. She reported some pain in her Rt shoulder with flexion at end range during prone postural strengthening. Soft tissue mobilization performed at EOS and patient continues to have tight band throughout bil upper trapezius muscles.  She will continue to benefit from skilled physical therapy in order to continue progressing towards functional goals.    Rehab Potential  Good    PT Frequency  2x / week    PT Duration  4 weeks    PT Treatment/Interventions  ADLs/Self Care Home Management;Cryotherapy;Electrical Stimulation;Moist Heat;Traction;Functional mobility training;Therapeutic activities;Therapeutic exercise;Balance training;Neuromuscular re-education;Patient/family education;Manual techniques;Passive range of motion;Dry needling;Energy conservation;Taping    PT Next Visit Plan  Minire-assess prior to MD visit (following Wednesday Appointment) Continue to review cervical chin tucks for form and continue cervical spine mobilization if with PT. Perform soft tissue work to bil upper trap, cervical paraspinals. Continue to progress postural strengthening and add TB exercises to HEP.    PT Home Exercise Plan  eval: cervical excursion, chin tucks    Consulted and Agree with Plan of Care  Patient       Patient will benefit from skilled therapeutic intervention in order to improve the following deficits and impairments:  Decreased endurance, Decreased mobility, Increased muscle spasms, Improper body mechanics, Decreased range of motion, Decreased activity tolerance, Decreased strength, Increased fascial restricitons, Impaired flexibility, Pain, Postural dysfunction  Visit Diagnosis: Cervicalgia  Abnormal posture  Muscle weakness (generalized)     Problem List Patient Active Problem List   Diagnosis Date Noted  . Abdominal pain 08/18/2017  . Nausea without vomiting  11/17/2016  . Current use of long term anticoagulation 11/12/2016  . Atrial fibrillation with RVR ([place]) 11/07/2016  . Coronary artery disease due to lipid rich plaque   . RLQ abdominal pain 10/03/2016  . Yeast infection 03/13/2016  . Fibroids 03/13/2016  . Screening for colorectal cancer 11/28/2015  . Burning with urination 11/28/2015  . Subacute frontal sinusitis 11/28/2015  . Leg cramps 10/31/2015  . Chest pain 10/31/2015  . Varicose 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 54/27/0623  . Body aches 11/21/2014  . Constipation 11/21/2014  . Vaginal itching 03/24/2014  . Vaginal discharge 03/24/2014  . Pelvic pain 11/02/2013  . Elevated cholesterol 11/02/2013  . Low back pain 10/20/2013  . Stiffness of ankle joint 10/20/2013  . Pain in joint, ankle and foot 10/20/2013  . Vaginal irritation 05/23/2013  . Hematuria 05/23/2013  . BV (bacterial vaginosis) 05/23/2013  . HEMATOCHEZIA 12/05/2009  . Dysphagia 12/05/2009  . CHEST WALL PAIN, ANTERIOR 06/23/2007  . LEG PAIN 05/18/2007  . VAGINAL PRURITUS 04/16/2007  . GOITER 03/10/2007  . HYPOTHYROIDISM 03/10/2007  . DIABETES MELLITUS, TYPE II 03/10/2007  . HYPERLIPIDEMIA 03/10/2007  . ANEMIA-IRON DEFICIENCY 03/10/2007  . Essential hypertension 03/10/2007  . RHINITIS, CHRONIC 03/10/2007  . ASTHMA, CHILDHOOD 03/10/2007  . GERD 03/10/2007  . PEPTIC ULCER DISEASE 03/10/2007  . MENOPAUSAL SYNDROME 03/10/2007    Kipp Brood, PT, DPT Physical Therapist with [facility]  09/15/2017 10:49 AM    [place], [place], [address] Phone: [phone]   Fax:  [phone]  Name: Becky [name] MRN: [id number] Date of Birth: [birth year]

## 2017-09-16 ENCOUNTER — Ambulatory Visit (HOSPITAL_COMMUNITY): Payer: BLUE CROSS/BLUE SHIELD

## 2017-09-16 DIAGNOSIS — M7541 Impingement syndrome of right shoulder: Secondary | ICD-10-CM | POA: Diagnosis not present

## 2017-09-22 ENCOUNTER — Ambulatory Visit (HOSPITAL_COMMUNITY): Payer: BLUE CROSS/BLUE SHIELD

## 2017-09-22 ENCOUNTER — Other Ambulatory Visit: Payer: Self-pay

## 2017-09-22 ENCOUNTER — Encounter (HOSPITAL_COMMUNITY): Payer: Self-pay

## 2017-09-22 ENCOUNTER — Other Ambulatory Visit: Payer: Self-pay | Admitting: Nurse Practitioner

## 2017-09-22 DIAGNOSIS — M25522 Pain in left elbow: Secondary | ICD-10-CM | POA: Diagnosis not present

## 2017-09-22 DIAGNOSIS — R29898 Other symptoms and signs involving the musculoskeletal system: Secondary | ICD-10-CM | POA: Diagnosis not present

## 2017-09-22 DIAGNOSIS — M542 Cervicalgia: Secondary | ICD-10-CM | POA: Diagnosis not present

## 2017-09-22 DIAGNOSIS — M6281 Muscle weakness (generalized): Secondary | ICD-10-CM | POA: Diagnosis not present

## 2017-09-22 DIAGNOSIS — R293 Abnormal posture: Secondary | ICD-10-CM | POA: Diagnosis not present

## 2017-09-22 NOTE — Therapy (Signed)
[place], [place], [address] Phone: [phone]   Fax:  [phone]  Physical Therapy Treatment/Discharge Summary  Patient Details  Name: Becky [name] MRN: [id number] Date of Birth: Feb 25, 1960 Referring Provider: Justice Britain, MD   Encounter Date: 09/22/2017   PHYSICAL THERAPY DISCHARGE SUMMARY  Visits from Start of Care: 5  Current functional level related to goals / functional outcomes: Re-assessment performed today and patient has made good progress with cervical ROM and UE strength. She reports feeling ~ 75-80% improved since starting therapy regarding her neck pain. She states she does not have as much discomfort at work and feels more limited by her Lt knee and low back at work with lifting. She continues to have difficulty sleeping at night and wakes up from pain still. She reported she does feel ready to attempt independent exercises with neck and that she would like to be discharged from physical therapy. She was educated that she can return if her neck begins to worsen or if she would like to address her low back pain and knee mobility to further improve lifting mechanics. She will be discharged after today's session.   Remaining deficits: See below details   Education / Equipment: Educated on progress twoards goals and on current limitations. Discussed readiness to attempt independent exercise fro cervical spien pain. Educated on updated HEP and safe lifting mechanics this session.  Plan: Patient agrees to discharge.  Patient goals were partially met. Patient is being discharged due to meeting the stated rehab goals.  ?????       PT End of Session - 09/22/17 1007    Visit Number  5    Number of Visits  9    Date for PT Re-Evaluation  09/23/17    Authorization Type  [employer] (no auth required, no visit limit)    Authorization Time Period  08/26/2017 - 09/25/2017    Authorization - Visit Number  4    Authorization - Number of Visits  10    PT Start Time  5597   pt late   PT Stop Time  1027    PT Time Calculation (min)  32 min    Activity Tolerance  Patient tolerated treatment well    Behavior During Therapy  WFL for tasks assessed/performed       Past Medical History:  Diagnosis Date  . Back pain   . Body aches 11/21/2014  . BV (bacterial vaginosis) 05/23/2013  . Constipation 11/21/2014  . Elevated cholesterol 11/02/2013  . Fibroids 03/13/2016  . Hematuria 05/23/2013  . Hyperlipidemia   . Hypertension   . Migraines   . PAF (paroxysmal atrial fibrillation) ([place])    a. diagnosed in 11/2016 --> started on Xarelto for anticoagulation  . Pelvic pain in female 11/02/2013  . Plantar fasciitis of right foot   . Thyroid disease   . Vaginal discharge 03/24/2014  . Vaginal irritation 05/23/2013  . Vaginal itching 03/24/2014    Past Surgical History:  Procedure Laterality Date  . ECTOPIC PREGNANCY SURGERY    . ESOPHAGOGASTRODUODENOSCOPY  12/19/2009   CBU:LAGTXM stricture s/p dilation/mild gastritis  . ESOPHAGOGASTRODUODENOSCOPY N/A 12/10/2015   Procedure: ESOPHAGOGASTRODUODENOSCOPY (EGD);  Surgeon: Danie Binder, MD;  Location: [facility];  Service: Endoscopy;  Laterality: N/A;  830  . ileocolonoscopy  12/19/2009   IWO:[id number] polyps/mild left-side diverticulosis/hemorrhoids  . KNEE SURGERY    . TUBAL LIGATION      There were no vitals filed for this  visit.  Subjective Assessment - 09/22/17 0957    Subjective  Patien reports not in much pain anymore    Limitations  Sitting;Lifting;Standing;House hold activities    How long can you sit comfortably?  3-4 hours    How long can you stand comfortably?  3-4 hours    How long can you walk comfortably?  3-4 hours     Patient Stated Goals  to have less pain/stiffness in neck during work    Currently in Pain?  No/denies       [facility] PT Assessment - 09/22/17 0001      Assessment   Medical Diagnosis  Cervicalgia    Referring  Provider  Justice Britain, MD    Hand Dominance  Right    Prior Therapy  none      Precautions   Precautions  None      Restrictions   Weight Bearing Restrictions  No      Prior Function   Level of Independence  Independent      Cognition   Overall Cognitive Status  Within Functional Limits for tasks assessed      Observation/Other Assessments   Focus on Therapeutic Outcomes (FOTO)   34% limited   was 36% limited on 08/26/17     AROM   Cervical Flexion  68   was 58   Cervical Extension  55   was 49   Cervical - Right Side Bend  45   was 40   Cervical - Left Side Bend  42   was 45   Cervical - Right Rotation  70   was 60   Cervical - Left Rotation  85   was 85     Strength   Right Shoulder Flexion  4+/5   was 4   Right Shoulder ABduction  4+/5   was 4+   Left Shoulder Flexion  4+/5   was 4   Left Shoulder ABduction  4+/5   was 4   Right Elbow Flexion  5/5   was 4+   Right Elbow Extension  5/5   was 4+   Left Elbow Flexion  5/5   was 5   Left Elbow Extension  5/5   was 4+   Right Wrist Flexion  4+/5   was 4+   Right Wrist Extension  4+/5   was 4+   Left Wrist Flexion  4+/5   was 4+   Left Wrist Extension  4+/5   was 4+      [facility] Adult PT Treatment/Exercise - 09/22/17 0001      Therapeutic Activites    Therapeutic Activities  Lifting    Lifting  10 reps: lifting 15lb box from ceilign to chest height shelf. Cues for squat mechanics and to bring box/item up to chest then press/stand up to lift with legs rather than leaning/bending forward and pulling up with arms. Patient demonstrated improved form wtih repetiion and cues for wider stance.      Neck Exercises: Theraband   Scapula Retraction  15 reps;Red    Shoulder Extension  15 reps;Red    Rows  15 reps;Red        PT Education - 09/22/17 1005    Education Details  Educated on progress twoards goals and on current limitations. Discussed readiness to attempt independent exercise fro cervical spien  pain. Educated on updated HEP and safe lifting mechanics this session.    Person(s) Educated  Patient    Methods  Explanation;Handout  Comprehension  Verbalized understanding       PT Short Term Goals - 09/22/17 1006      PT SHORT TERM GOAL #1   Title  Patient will be independent with HEP, updated PRN, to improve postural strength and cervical ROM, as well as activity tolerance with work activities.    Time  2    Period  Weeks    Status  Achieved      PT SHORT TERM GOAL #2   Title  Patient will improve Cervical Rotation by 8 degrees towards Rt to demonstrate significant improvement in cervical mobility.    Time  2    Period  Weeks    Status  Achieved        PT Long Term Goals - 09/22/17 1006      PT LONG TERM GOAL #1   Title  Patient will improve all limited cervical ROM to WNL's and Rt cervical rotation to 75 degrees or greater.    Time  4    Period  Weeks    Status  Not Met      PT LONG TERM GOAL #2   Title  Patient will improve bil UE strength by 1/2 grade for all limited groups to improve functional strength for greater endurance and activity tolerance at work.    Time  4    Period  Weeks    Status  Partially Met      PT LONG TERM GOAL #3   Title  Patient will demonstrate proper lifting mechanics for floor to overhead with 25 lbs and floor to waist with 40 lbs to improve mechanics with increased load for safe mobility at work to reduce injury risk.    Time  4    Period  Weeks    Status  On-going      PT LONG TERM GOAL #4   Title  Patient will sleep through the night without waking up with neck pain for 1 week to demonstrate improve mobility and tolerance to sustained positions with cervical spine.    Time  4    Period  Weeks    Status  Not Met       Plan - 09/22/17 1016    Clinical Impression Statement  Re-assessment performed today and patient has made good progress with cervical ROM and UE strength. She reports feeling ~ 75-80% improved since starting  therapy regarding her neck pain. She states she does not have as much discomfort at work and feels more limited by her Lt knee and low back at work with lifting. She continues to have difficulty sleeping at night and wakes up from pain still. She reported she does feel ready to attempt independent exercises with neck and that she would like to be discharged from physical therapy. She was educated that she can return if her neck begins to worsen or if she would like to address her low back pain and knee mobility to further improve lifting mechanics. She will be discharged after today's session.    Rehab Potential  Good    PT Frequency  2x / week    PT Duration  4 weeks    PT Treatment/Interventions  ADLs/Self Care Home Management;Cryotherapy;Electrical Stimulation;Moist Heat;Traction;Functional mobility training;Therapeutic activities;Therapeutic exercise;Balance training;Neuromuscular re-education;Patient/family education;Manual techniques;Passive range of motion;Dry needling;Energy conservation;Taping    PT Next Visit Plan  discharge    PT Home Exercise Plan  eval: cervical excursion, chin tucks; 3 way posture theraband    Consulted and Agree  with Plan of Care  Patient       Patient will benefit from skilled therapeutic intervention in order to improve the following deficits and impairments:  Decreased endurance, Decreased mobility, Increased muscle spasms, Improper body mechanics, Decreased range of motion, Decreased activity tolerance, Decreased strength, Increased fascial restricitons, Impaired flexibility, Pain, Postural dysfunction  Visit Diagnosis: Cervicalgia  Abnormal posture  Muscle weakness (generalized)     Problem List Patient Active Problem List   Diagnosis Date Noted  . Abdominal pain 08/18/2017  . Nausea without vomiting 11/17/2016  . Current use of long term anticoagulation 11/12/2016  . Atrial fibrillation with RVR ([place]) 11/07/2016  . Coronary artery disease due to  lipid rich plaque   . RLQ abdominal pain 10/03/2016  . Yeast infection 03/13/2016  . Fibroids 03/13/2016  . Screening for colorectal cancer 11/28/2015  . Burning with urination 11/28/2015  . Subacute frontal sinusitis 11/28/2015  . Leg cramps 10/31/2015  . Chest pain 10/31/2015  . Varicose 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 46/56/8127  . Body aches 11/21/2014  . Constipation 11/21/2014  . Vaginal itching 03/24/2014  . Vaginal discharge 03/24/2014  . Pelvic pain 11/02/2013  . Elevated cholesterol 11/02/2013  . Low back pain 10/20/2013  . Stiffness of ankle joint 10/20/2013  . Pain in joint, ankle and foot 10/20/2013  . Vaginal irritation 05/23/2013  . Hematuria 05/23/2013  . BV (bacterial vaginosis) 05/23/2013  . HEMATOCHEZIA 12/05/2009  . Dysphagia 12/05/2009  . CHEST WALL PAIN, ANTERIOR 06/23/2007  . LEG PAIN 05/18/2007  . VAGINAL PRURITUS 04/16/2007  . GOITER 03/10/2007  . HYPOTHYROIDISM 03/10/2007  . DIABETES MELLITUS, TYPE II 03/10/2007  . HYPERLIPIDEMIA 03/10/2007  . ANEMIA-IRON DEFICIENCY 03/10/2007  . Essential hypertension 03/10/2007  . RHINITIS, CHRONIC 03/10/2007  . ASTHMA, CHILDHOOD 03/10/2007  . GERD 03/10/2007  . PEPTIC ULCER DISEASE 03/10/2007  . MENOPAUSAL SYNDROME 03/10/2007    Kipp Brood, PT, DPT Physical Therapist with [facility]  09/22/2017 12:32 PM    [place], [place], [address] Phone: [phone]   Fax:  [phone]  Name: Becky [name] MRN: [id number] Date of Birth: 12/12/1960

## 2017-09-23 ENCOUNTER — Ambulatory Visit (HOSPITAL_COMMUNITY): Payer: BLUE CROSS/BLUE SHIELD

## 2017-09-23 ENCOUNTER — Telehealth (HOSPITAL_COMMUNITY): Payer: Self-pay

## 2017-09-23 NOTE — Telephone Encounter (Signed)
Called patient regarding no show this morning. Patient apologized as she had overslept. Patient would like to reschedule this missed appointment and will be put on wait list for this week. If any morning appointments open we will call her.   Ailene Ravel, OTR/L,CBIS  [phone]

## 2017-09-24 ENCOUNTER — Encounter: Payer: Self-pay | Admitting: Adult Health

## 2017-09-24 ENCOUNTER — Ambulatory Visit (INDEPENDENT_AMBULATORY_CARE_PROVIDER_SITE_OTHER): Payer: BLUE CROSS/BLUE SHIELD | Admitting: Adult Health

## 2017-09-24 VITALS — BP 158/93 | HR 70 | Ht 67.0 in | Wt 247.0 lb

## 2017-09-24 DIAGNOSIS — N950 Postmenopausal bleeding: Secondary | ICD-10-CM | POA: Diagnosis not present

## 2017-09-24 DIAGNOSIS — B9689 Other specified bacterial agents as the cause of diseases classified elsewhere: Secondary | ICD-10-CM

## 2017-09-24 DIAGNOSIS — N760 Acute vaginitis: Secondary | ICD-10-CM | POA: Diagnosis not present

## 2017-09-24 DIAGNOSIS — N898 Other specified noninflammatory disorders of vagina: Secondary | ICD-10-CM | POA: Insufficient documentation

## 2017-09-24 LAB — POCT WET PREP (WET MOUNT)
Clue Cells Wet Prep Whiff POC: POSITIVE
Trichomonas Wet Prep HPF POC: ABSENT
WBC, Wet Prep HPF POC: POSITIVE

## 2017-09-24 MED ORDER — METRONIDAZOLE 500 MG PO TABS: 500.0000 mg | ORAL_TABLET | Freq: Two times a day (BID) | ORAL | 0 refills | Status: DC

## 2017-09-24 NOTE — Patient Instructions (Signed)
Bacterial Vaginosis Bacterial vaginosis is a vaginal infection that occurs when the normal balance of bacteria in the vagina is disrupted. It results from an overgrowth of certain bacteria. This is the most common vaginal infection among women ages 15-44. Because bacterial vaginosis increases your risk for STIs (sexually transmitted infections), getting treated can help reduce your risk for chlamydia, gonorrhea, herpes, and HIV (human immunodeficiency virus). Treatment is also important for preventing complications in pregnant women, because this condition can cause an early (premature) delivery. What are the causes? This condition is caused by an increase in harmful bacteria that are normally present in small amounts in the vagina. However, the reason that the condition develops is not fully understood. What increases the risk? The following factors may make you more likely to develop this condition:  Having a new sexual partner or multiple sexual partners.  Having unprotected sex.  Douching.  Having an intrauterine device (IUD).  Smoking.  Drug and alcohol abuse.  Taking certain antibiotic medicines.  Being pregnant.  You cannot get bacterial vaginosis from toilet seats, bedding, swimming pools, or contact with objects around you. What are the signs or symptoms? Symptoms of this condition include:  Grey or white vaginal discharge. The discharge can also be watery or foamy.  A fish-like odor with discharge, especially after sexual intercourse or during menstruation.  Itching in and around the vagina.  Burning or pain with urination.  Some women with bacterial vaginosis have no signs or symptoms. How is this diagnosed? This condition is diagnosed based on:  Your medical history.  A physical exam of the vagina.  Testing a sample of vaginal fluid under a microscope to look for a large amount of bad bacteria or abnormal cells. Your health care provider may use a cotton swab  or a small wooden spatula to collect the sample.  How is this treated? This condition is treated with antibiotics. These may be given as a pill, a vaginal cream, or a medicine that is put into the vagina (suppository). If the condition comes back after treatment, a second round of antibiotics may be needed. Follow these instructions at home: Medicines  Take over-the-counter and prescription medicines only as told by your health care provider.  Take or use your antibiotic as told by your health care provider. Do not stop taking or using the antibiotic even if you start to feel better. General instructions  If you have a female sexual partner, tell her that you have a vaginal infection. She should see her health care provider and be treated if she has symptoms. If you have a female sexual partner, he does not need treatment.  During treatment: ? Avoid sexual activity until you finish treatment. ? Do not douche. ? Avoid alcohol as directed by your health care provider. ? Avoid breastfeeding as directed by your health care provider.  Drink enough water and fluids to keep your urine clear or pale yellow.  Keep the area around your vagina and rectum clean. ? Wash the area daily with warm water. ? Wipe yourself from front to back after using the toilet.  Keep all follow-up visits as told by your health care provider. This is important. How is this prevented?  Do not douche.  Wash the outside of your vagina with warm water only.  Use protection when having sex. This includes latex condoms and dental dams.  Limit how many sexual partners you have. To help prevent bacterial vaginosis, it is best to have sex with just   one partner (monogamous).  Make sure you and your sexual partner are tested for STIs.  Wear cotton or cotton-lined underwear.  Avoid wearing tight pants and pantyhose, especially during summer.  Limit the amount of alcohol that you drink.  Do not use any products that  contain nicotine or tobacco, such as cigarettes and e-cigarettes. If you need help quitting, ask your health care provider.  Do not use illegal drugs. Where to find more information:  Centers for Disease Control and Prevention: www.cdc.gov/std  American Sexual Health Association (ASHA): www.ashastd.org  U.S. Department of Health and Human Services, Office on Women's Health: www.womenshealth.gov/ or https://www.womenshealth.gov/a-z-topics/bacterial-vaginosis Contact a health care provider if:  Your symptoms do not improve, even after treatment.  You have more discharge or pain when urinating.  You have a fever.  You have pain in your abdomen.  You have pain during sex.  You have vaginal bleeding between periods. Summary  Bacterial vaginosis is a vaginal infection that occurs when the normal balance of bacteria in the vagina is disrupted.  Because bacterial vaginosis increases your risk for STIs (sexually transmitted infections), getting treated can help reduce your risk for chlamydia, gonorrhea, herpes, and HIV (human immunodeficiency virus). Treatment is also important for preventing complications in pregnant women, because the condition can cause an early (premature) delivery.  This condition is treated with antibiotic medicines. These may be given as a pill, a vaginal cream, or a medicine that is put into the vagina (suppository). This information is not intended to replace advice given to you by your health care provider. Make sure you discuss any questions you have with your health care provider. Document Released: 12/23/2004 Document Revised: 04/28/2016 Document Reviewed: 09/08/2015 Elsevier Interactive Patient Education  2018 [employer] Inc[employer]  

## 2017-09-24 NOTE — Progress Notes (Signed)
  Subjective:     Patient ID: Becky [name], female   DOB: 10-02-1960, 57 y.o.   MRN: [id number]  HPI Becky [name] is a 57 year old black female, married in complaining of vaginal discharge, with odor, and itching.And had spotting earlier this week. Sometimes husband smells fishy. PCP is Dr Gerarda Fraction.  Review of Systems +vaginal discharge +vaginal odor +vaginal itching Spotted early this week Reviewed past medical,surgical, social and family history. Reviewed medications and allergies.     Objective:   Physical Exam BP (!) 158/93 (BP Location: Right Arm, Patient Position: Sitting, Cuff Size: Large)   Pulse 70   Ht 5\' 7"  (1.702 m)   Wt 247 lb (112 kg)   BMI 38.69 kg/m    Skin warm and dry.Pelvic: external genitalia is normal in appearance no lesions, vagina: white discharge with odor,urethra has no lesions or masses noted, cervix:smooth and bulbous, uterus: normal size, shape and contour, non tender, no masses felt, adnexa: no masses or tenderness noted. Bladder is non tender and no masses felt. Wet prep: + for clue cells and +WBCs.  Assessment:     1. PMB (postmenopausal bleeding)   2. BV (bacterial vaginosis)   3. Vaginal discharge   4. Vaginal itching   5. Vaginal odor       Plan:     Meds ordered this encounter  Medications  . metroNIDAZOLE (FLAGYL) 500 MG tablet    Sig: Take 1 tablet (500 mg total) by mouth 2 (two) times daily.    Dispense:  14 tablet    Refill:  0    Order Specific Question:   Supervising Provider    Answer:   Tania Ade H [[id number]]  NO sex or alcohol while taking meds    Return in 1 week for GYN [place] Review handout on BV

## 2017-09-25 ENCOUNTER — Encounter

## 2017-09-29 ENCOUNTER — Encounter (HOSPITAL_COMMUNITY): Payer: BLUE CROSS/BLUE SHIELD

## 2017-09-29 ENCOUNTER — Encounter (HOSPITAL_COMMUNITY): Payer: Self-pay

## 2017-09-29 ENCOUNTER — Ambulatory Visit (HOSPITAL_COMMUNITY): Payer: BLUE CROSS/BLUE SHIELD

## 2017-09-29 DIAGNOSIS — M25522 Pain in left elbow: Secondary | ICD-10-CM

## 2017-09-29 DIAGNOSIS — R293 Abnormal posture: Secondary | ICD-10-CM | POA: Diagnosis not present

## 2017-09-29 DIAGNOSIS — M6281 Muscle weakness (generalized): Secondary | ICD-10-CM | POA: Diagnosis not present

## 2017-09-29 DIAGNOSIS — R29898 Other symptoms and signs involving the musculoskeletal system: Secondary | ICD-10-CM

## 2017-09-29 DIAGNOSIS — M542 Cervicalgia: Secondary | ICD-10-CM | POA: Diagnosis not present

## 2017-09-29 NOTE — Therapy (Signed)
[place], [place], [address] Phone: [phone]   Fax:  [phone]  Occupational Therapy Treatment  Patient Details  Name: Becky [name] MRN: [id number] Date of Birth: 1960/08/05 Referring Provider: [name], MD   Encounter Date: 09/29/2017  OT End of Session - 09/29/17 1216    Visit Number  6    Number of Visits  8    Date for OT Re-Evaluation  10/02/17    Authorization Type  Primary is BC[employer] - $25 copay and visits based on medical necessity; secondary is also [place] may be covered    Authorization Time Period  visits based on medical necessity    OT Start Time  1050   Pt arrived late   OT Stop Time  1115    OT Time Calculation (min)  25 min    Activity Tolerance  Patient tolerated treatment well    Behavior During Therapy  [facility] for tasks assessed/performed       Past Medical History:  Diagnosis Date  . Back pain   . Body aches 11/21/2014  . BV (bacterial vaginosis) 05/23/2013  . Constipation 11/21/2014  . Elevated cholesterol 11/02/2013  . Fibroids 03/13/2016  . Hematuria 05/23/2013  . Hyperlipidemia   . Hypertension   . Migraines   . PAF (paroxysmal atrial fibrillation) ([place])    a. diagnosed in 11/2016 --> started on Xarelto for anticoagulation  . Pelvic pain in female 11/02/2013  . Plantar fasciitis of right foot   . Thyroid disease   . Vaginal discharge 03/24/2014  . Vaginal irritation 05/23/2013  . Vaginal itching 03/24/2014    Past Surgical History:  Procedure Laterality Date  . ECTOPIC PREGNANCY SURGERY    . ESOPHAGOGASTRODUODENOSCOPY  12/19/2009   BPZ:WCHENI stricture s/p dilation/mild gastritis  . ESOPHAGOGASTRODUODENOSCOPY N/A 12/10/2015   Procedure: ESOPHAGOGASTRODUODENOSCOPY (EGD);  Surgeon: Danie Binder, MD;  [facility];  Service: Endoscopy;  Laterality: N/A;  830  . ileocolonoscopy  12/19/2009   DPO:[id number] polyps/mild left-side  diverticulosis/hemorrhoids  . KNEE SURGERY    . TUBAL LIGATION      There were no vitals filed for this visit.  Subjective Assessment - 09/29/17 1212    Subjective   S: I've been working a lot and I'm tired but it doesn't hurt right now.    Currently in Pain?  No/denies         [facility] OT Assessment - 09/29/17 1100      Assessment   Medical Diagnosis  Left elbow pain    Referring Provider  [name], MD      Precautions   Precautions  None               OT Treatments/Exercises (OP) - 09/29/17 1052      Exercises   Exercises  Elbow;Wrist;Hand;Theraputty      Additional Elbow Exercises   Theraputty - Flatten  red- standing    Theraputty - Roll  red    Theraputty - Grip  red- supinated and pronated      Additional Wrist Exercises   Sponges  Patient utilized blue resistive clothespin to pick up 20 sponges utilizing a 3 point pinch.       Hand Exercises   Other Hand Exercises  PVC pipe utilized with red putty to cut out circles focus on UE strength and wrist flexion and extension strength.       Theraputty   Theraputty - Pinch  red- lateral and  3 point    Theraputty Hand- Locate Pegs  red - 5             OT Education - 09/29/17 1215    Education Details  red theraputty and grip and pinch strengthening activities    Person(s) Educated  Patient    Methods  Explanation;Handout    Comprehension  Verbalized understanding       OT Short Term Goals - 09/09/17 1044      OT SHORT TERM GOAL #1   Title  Patient will decrease facsial restrictions in left forearm in order to decrease pain and increase functional use of the left arm during daily and work activities.    Time  4    Period  Weeks    Status  On-going      OT SHORT TERM GOAL #2   Title  Patient will reduce pain to a 3/10 or less in the LUE while completing daily tasks.    Time  4    Period  Weeks    Status  On-going      OT SHORT TERM GOAL #3   Title  Patient will increase left elbow and  wrist strength to 5/5 in order to be able to complete lifting tasks at work with less difficulty.    Time  4    Period  Weeks    Status  On-going      OT SHORT TERM GOAL #4   Title  Pt will increase grip strength in left hand by 15# and tip pinch by 3# to improve ability to hold onto items during daily and work tasks.     Time  4    Period  Weeks    Status  On-going               Plan - 09/29/17 1216    Clinical Impression Statement  A: Patient reports that her elbow is feeling better with pain although she continues to experience hand weakness. Patient states that at work she has decreased hand strength when gripping items with her hand supinated. Red theraputty provided for hand strengthening HEP. VC for form and technique during session. Manual therapy not completed due to time constraint.     Plan  P: Manual techniques PRN. Continue with pinch and grip strengthening. Work towards Civil engineer, contracting strength as able. Complete Reassessment next session. Determine if more therapy is needed.        Patient will benefit from skilled therapeutic intervention in order to improve the following deficits and impairments:  Increased edema, Increased fascial restrictions, Decreased strength, Impaired UE functional use, Decreased activity tolerance, Pain  Visit Diagnosis: Pain in left elbow  Other symptoms and signs involving the musculoskeletal system    Problem List Patient Active Problem List   Diagnosis Date Noted  . Vaginal odor 09/24/2017  . Abdominal pain 08/18/2017  . Nausea without vomiting 11/17/2016  . Current use of long term anticoagulation 11/12/2016  . Atrial fibrillation with RVR ([place]) 11/07/2016  . Coronary artery disease due to lipid rich plaque   . RLQ abdominal pain 10/03/2016  . Yeast infection 03/13/2016  . Fibroids 03/13/2016  . Screening for colorectal cancer 11/28/2015  . Burning with urination 11/28/2015  . Subacute frontal sinusitis 11/28/2015   . Leg cramps 10/31/2015  . Chest pain 10/31/2015  . Varicose 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 02/54/2706  . Body aches 11/21/2014  . Constipation 11/21/2014  . Vaginal itching 03/24/2014  .  Vaginal discharge 03/24/2014  . Pelvic pain 11/02/2013  . Elevated cholesterol 11/02/2013  . Low back pain 10/20/2013  . Stiffness of ankle joint 10/20/2013  . Pain in joint, ankle and foot 10/20/2013  . Vaginal irritation 05/23/2013  . Hematuria 05/23/2013  . BV (bacterial vaginosis) 05/23/2013  . HEMATOCHEZIA 12/05/2009  . Dysphagia 12/05/2009  . CHEST WALL PAIN, ANTERIOR 06/23/2007  . LEG PAIN 05/18/2007  . VAGINAL PRURITUS 04/16/2007  . GOITER 03/10/2007  . HYPOTHYROIDISM 03/10/2007  . DIABETES MELLITUS, TYPE II 03/10/2007  . HYPERLIPIDEMIA 03/10/2007  . ANEMIA-IRON DEFICIENCY 03/10/2007  . Essential hypertension 03/10/2007  . RHINITIS, CHRONIC 03/10/2007  . ASTHMA, CHILDHOOD 03/10/2007  . GERD 03/10/2007  . PEPTIC ULCER DISEASE 03/10/2007  . MENOPAUSAL SYNDROME 03/10/2007   Ailene Ravel, OTR/L,CBIS  [phone]  09/29/2017, 12:19 PM  [place] [address] [place], [place], [address] Phone: [phone]   Fax:  [phone]  Name: Becky [name] MRN: [id number] Date of Birth: April 04, 1960

## 2017-09-29 NOTE — Patient Instructions (Signed)
Home Exercises Program Theraputty Exercises  Do the following exercises 2-3 times a day using your affected hand.  1. Roll putty into a ball.  2. Make into a pancake.  3. Roll putty into a roll.  4. Pinch along log with first finger and thumb.   5. Make into a ball.  6. Roll it back into a log.   7. Pinch using thumb and side of first finger.  8. Roll into a ball, then flatten into a pancake.  9. Using your fingers, make putty into a mountain.  10. Squeeze and release 10-15 times.

## 2017-09-30 ENCOUNTER — Ambulatory Visit (HOSPITAL_COMMUNITY): Payer: BLUE CROSS/BLUE SHIELD

## 2017-09-30 ENCOUNTER — Encounter (HOSPITAL_COMMUNITY): Payer: BLUE CROSS/BLUE SHIELD

## 2017-10-02 ENCOUNTER — Ambulatory Visit (INDEPENDENT_AMBULATORY_CARE_PROVIDER_SITE_OTHER): Payer: BLUE CROSS/BLUE SHIELD

## 2017-10-02 DIAGNOSIS — N950 Postmenopausal bleeding: Secondary | ICD-10-CM | POA: Diagnosis not present

## 2017-10-02 NOTE — Progress Notes (Signed)
PELVIC US TA/TV:heterogeneous retroflexed uterus w/mult.fibroids,(#1) subserosal fibroid 1.5 x 1.3 x 1.3 cm,(#2)anterior intramural fibroid 2.3 x 1.5 x 2 cm,thickened endometrium,no color flow visualized,EEC 6.8 mm,right adnexa wnl,? right oophorectomy,normal left ovary,no free fluid,no pain during ultrasound

## 2017-10-03 ENCOUNTER — Encounter

## 2017-10-05 ENCOUNTER — Telehealth: Payer: Self-pay | Admitting: Adult Health

## 2017-10-05 NOTE — Telephone Encounter (Signed)
Left message that US showed +fibroids and endometrium is thickened at 6.8 mm, will need endometrial biopsy, call office for appt with Dr Elonda Husky for endometrial biopsy

## 2017-10-06 ENCOUNTER — Telehealth: Payer: Self-pay | Admitting: *Deleted

## 2017-10-06 NOTE — Telephone Encounter (Signed)
Pt aware EEC 6.8 needs endo bx with Dr Elonda Husky

## 2017-10-07 ENCOUNTER — Ambulatory Visit (HOSPITAL_COMMUNITY): Payer: BLUE CROSS/BLUE SHIELD | Attending: Orthopedic Surgery

## 2017-10-07 ENCOUNTER — Encounter (HOSPITAL_COMMUNITY): Payer: Self-pay

## 2017-10-07 ENCOUNTER — Encounter (HOSPITAL_COMMUNITY): Payer: BLUE CROSS/BLUE SHIELD

## 2017-10-07 DIAGNOSIS — R29898 Other symptoms and signs involving the musculoskeletal system: Secondary | ICD-10-CM | POA: Diagnosis not present

## 2017-10-07 DIAGNOSIS — M25522 Pain in left elbow: Secondary | ICD-10-CM

## 2017-10-07 NOTE — Therapy (Signed)
[place], [place], [address] Phone: [phone]   Fax:  [phone]  Occupational Therapy Treatment Reassessment and discharge Patient Details  Name: Becky [name] MRN: [id number] Date of Birth: [birth year] Referring Provider (OT): [name], MD   Encounter Date: 10/07/2017  OT End of Session - 10/07/17 1232    Visit Number  7    Number of Visits  8    Authorization Type  Primary is [employer]BS - $25 copay and visits based on medical necessity; secondary is also [place] may be covered    Authorization Time Period  visits based on medical necessity    OT Start Time  1038   reassessment and discharge   OT Stop Time  1100    OT Time Calculation (min)  22 min    Activity Tolerance  Patient tolerated treatment well    Behavior During Therapy  [facility] for tasks assessed/performed       Past Medical History:  Diagnosis Date  . Back pain   . Body aches 11/21/2014  . BV (bacterial vaginosis) 05/23/2013  . Constipation 11/21/2014  . Elevated cholesterol 11/02/2013  . Fibroids 03/13/2016  . Hematuria 05/23/2013  . Hyperlipidemia   . Hypertension   . Migraines   . PAF (paroxysmal atrial fibrillation) ([place])    a. diagnosed in 11/2016 --> started on Xarelto for anticoagulation  . Pelvic pain in female 11/02/2013  . Plantar fasciitis of right foot   . Thyroid disease   . Vaginal discharge 03/24/2014  . Vaginal irritation 05/23/2013  . Vaginal itching 03/24/2014    Past Surgical History:  Procedure Laterality Date  . ECTOPIC PREGNANCY SURGERY    . ESOPHAGOGASTRODUODENOSCOPY  12/19/2009   MVE:HMCNOB stricture s/p dilation/mild gastritis  . ESOPHAGOGASTRODUODENOSCOPY N/A 12/10/2015   Procedure: ESOPHAGOGASTRODUODENOSCOPY (EGD);  Surgeon: Danie Binder, MD;  [facility];  Service: Endoscopy;  Laterality: N/A;  830  . ileocolonoscopy  12/19/2009   SJG:[id number] polyps/mild left-side  diverticulosis/hemorrhoids  . KNEE SURGERY    . TUBAL LIGATION      There were no vitals filed for this visit.  Subjective Assessment - 10/07/17 1039    Subjective   S: My elbow has been feeling fine but my hands have been numb and at night it's the worse.     Currently in Pain?  No/denies         [facility] OT Assessment - 10/07/17 1039      Assessment   Medical Diagnosis  Left elbow pain    Referring Provider (OT)  [name], MD      Precautions   Precautions  None      ROM / Strength   AROM / PROM / Strength  AROM;Strength      Palpation   Palpation comment  zero fascial restrictions in left lateral epicondyle region.       Strength   Left Elbow Flexion  5/5   previous: 5/5   Left Elbow Extension  5/5   previous: 4+/5   Right Wrist Flexion  5/5   previous: 5/5   Right Wrist Extension  5/5   previous: 4+/5   Left Wrist Radial Deviation  5/5   previous: 5/5   Left Wrist Ulnar Deviation  5/5   previous: 4+/5     Hand Function   Right Hand Grip (lbs)  43   previous: 43   Right Hand Lateral Pinch  16 lbs   previous: 19  Right Hand 3 Point Pinch  10 lbs   previous: 10   Left Hand Grip (lbs)  45   previous: 37   Left Hand Lateral Pinch  17 lbs   previous:16   Left 3 point pinch  11 lbs   previous: 9                      OT Education - 10/07/17 1232    Education Details  Reviewed progress in therapy. Reviewed goals. Made recommendations to continue established HEP.     Person(s) Educated  Patient    Methods  Explanation    Comprehension  Verbalized understanding       OT Short Term Goals - 10/07/17 1051      OT SHORT TERM GOAL #1   Title  Patient will decrease fascial restrictions in left forearm in order to decrease pain and increase functional use of the left arm during daily and work activities.    Time  4    Period  Weeks    Status  Achieved      OT SHORT TERM GOAL #2   Title  Patient will reduce pain to a 3/10 or less in the  LUE while completing daily tasks.    Time  4    Period  Weeks    Status  Achieved      OT SHORT TERM GOAL #3   Title  Patient will increase left elbow and wrist strength to 5/5 in order to be able to complete lifting tasks at work with less difficulty.    Time  4    Period  Weeks    Status  Achieved      OT SHORT TERM GOAL #4   Title  Pt will increase grip strength in left hand by 15# and tip pinch by 3# to improve ability to hold onto items during daily and work tasks.     Time  4    Period  Weeks    Status  Partially Met               Plan - 10/07/17 1233    Clinical Impression Statement  A: Reassessment completed this date. patient has met all therapy goals with the exception of her grip and pinch strength goal. Patient's grip has decreased in both hands. Her pinch strength has either stayed the same of increased slightly. The hand strength is more than likely caused from her CTS. patient reports no continued deficits with her left elbow. She is no longer experiencing increased pain. She voices numbness, swelling, and weakness in bilateral hands. Patient was encouraged to follow up with MD regarding CTS and need for surgery. Pt verbalized understanding.     Plan  P: D/C patient from OT services with HEP. Recommend patient follow up with MD regarding bilateral CTS.     Consulted and Agree with Plan of Care  Patient       Patient will benefit from skilled therapeutic intervention in order to improve the following deficits and impairments:  Increased edema, Increased fascial restrictions, Decreased strength, Impaired UE functional use, Decreased activity tolerance, Pain  Visit Diagnosis: Pain in left elbow  Other symptoms and signs involving the musculoskeletal system    Problem List Patient Active Problem List   Diagnosis Date Noted  . Vaginal odor 09/24/2017  . Abdominal pain 08/18/2017  . Nausea without vomiting 11/17/2016  . Current use of long term  anticoagulation 11/12/2016  .  Atrial fibrillation with RVR ([place]) 11/07/2016  . Coronary artery disease due to lipid rich plaque   . RLQ abdominal pain 10/03/2016  . Yeast infection 03/13/2016  . Fibroids 03/13/2016  . Screening for colorectal cancer 11/28/2015  . Burning with urination 11/28/2015  . Subacute frontal sinusitis 11/28/2015  . Leg cramps 10/31/2015  . Chest pain 10/31/2015  . Varicose 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 68/54/8830  . Body aches 11/21/2014  . Constipation 11/21/2014  . Vaginal itching 03/24/2014  . Vaginal discharge 03/24/2014  . Pelvic pain 11/02/2013  . Elevated cholesterol 11/02/2013  . Low back pain 10/20/2013  . Stiffness of ankle joint 10/20/2013  . Pain in joint, ankle and foot 10/20/2013  . Vaginal irritation 05/23/2013  . Hematuria 05/23/2013  . BV (bacterial vaginosis) 05/23/2013  . HEMATOCHEZIA 12/05/2009  . Dysphagia 12/05/2009  . CHEST WALL PAIN, ANTERIOR 06/23/2007  . LEG PAIN 05/18/2007  . VAGINAL PRURITUS 04/16/2007  . GOITER 03/10/2007  . HYPOTHYROIDISM 03/10/2007  . DIABETES MELLITUS, TYPE II 03/10/2007  . HYPERLIPIDEMIA 03/10/2007  . ANEMIA-IRON DEFICIENCY 03/10/2007  . Essential hypertension 03/10/2007  . RHINITIS, CHRONIC 03/10/2007  . ASTHMA, CHILDHOOD 03/10/2007  . GERD 03/10/2007  . PEPTIC ULCER DISEASE 03/10/2007  . MENOPAUSAL SYNDROME 03/10/2007   OCCUPATIONAL THERAPY DISCHARGE SUMMARY  Visits from Start of Care: 7  Current functional level related to goals / functional outcomes: See above   Remaining deficits: None for left elbow.   Education / Equipment: See above. Red theraputty, wrist strengthening exercises.  Plan: Patient agrees to discharge.  Patient goals were met. Patient is being discharged due to meeting the stated rehab goals.  ?????          Becky [name], OTR/L,CBIS  [phone]  10/07/2017, 12:36 PM  [facility] [address] [place], [place], [address] Phone: [phone]   Fax:  [phone]  Name: Camauri Fleece MRN: [id number] Date of Birth: 1960-06-07

## 2017-10-12 ENCOUNTER — Telehealth: Payer: Self-pay | Admitting: Obstetrics & Gynecology

## 2017-10-12 NOTE — Telephone Encounter (Signed)
Patient called stating that she is having an endometrial Biopsy done on 10/19/2017, Pt states that she is on blood thinner and would like to Know if she needs to stop blood thinners before her procedure. Please contact pt

## 2017-10-12 NOTE — Telephone Encounter (Signed)
LMOVM that per Dr Elonda Husky she could continue her medications.

## 2017-10-14 ENCOUNTER — Encounter (HOSPITAL_COMMUNITY): Payer: BLUE CROSS/BLUE SHIELD | Admitting: Occupational Therapy

## 2017-10-14 DIAGNOSIS — J019 Acute sinusitis, unspecified: Secondary | ICD-10-CM | POA: Diagnosis not present

## 2017-10-14 DIAGNOSIS — Z6836 Body mass index (BMI) 36.0-36.9, adult: Secondary | ICD-10-CM | POA: Diagnosis not present

## 2017-10-14 DIAGNOSIS — J301 Allergic rhinitis due to pollen: Secondary | ICD-10-CM | POA: Diagnosis not present

## 2017-10-14 DIAGNOSIS — Z1389 Encounter for screening for other disorder: Secondary | ICD-10-CM | POA: Diagnosis not present

## 2017-10-14 DIAGNOSIS — E6609 Other obesity due to excess calories: Secondary | ICD-10-CM | POA: Diagnosis not present

## 2017-10-19 ENCOUNTER — Other Ambulatory Visit: Payer: Self-pay | Admitting: Obstetrics & Gynecology

## 2017-10-19 ENCOUNTER — Ambulatory Visit (INDEPENDENT_AMBULATORY_CARE_PROVIDER_SITE_OTHER): Payer: BLUE CROSS/BLUE SHIELD | Admitting: Obstetrics & Gynecology

## 2017-10-19 VITALS — BP 154/89 | HR 66 | Ht 67.0 in | Wt 251.0 lb

## 2017-10-19 DIAGNOSIS — N858 Other specified noninflammatory disorders of uterus: Secondary | ICD-10-CM | POA: Diagnosis not present

## 2017-10-19 DIAGNOSIS — N950 Postmenopausal bleeding: Secondary | ICD-10-CM | POA: Diagnosis not present

## 2017-10-19 DIAGNOSIS — R9389 Abnormal findings on diagnostic imaging of other specified body structures: Secondary | ICD-10-CM

## 2017-10-19 NOTE — Addendum Note (Signed)
Addended by: Christiana Pellant A on: 10/19/2017 04:48 PM   Modules accepted: Orders

## 2017-10-19 NOTE — Progress Notes (Signed)
Endometrial Biopsy Procedure Note  Pre-operative Diagnosis: PMB with thickened endometrium  Post-operative Diagnosis: same  Indications: postmenopausal bleeding  Procedure Details   Urine pregnancy test was not done.  The risks (including infection, bleeding, pain, and uterine perforation) and benefits of the procedure were explained to the patient and Written informed consent was obtained.  Antibiotic prophylaxis against endocarditis was not indicated.   The patient was placed in the dorsal lithotomy position.  Bimanual exam showed the uterus to be in the neutral position.  A Graves' speculum inserted in the vagina, and the cervix prepped with povidone iodine.  Endocervical curettage with a Kevorkian curette was not performed.   A sharp tenaculum was applied to the anterior lip of the cervix for stabilization.  A sterile uterine sound was used to sound the uterus to a depth of 6.5cm.  A P[place]elle endometrial aspirator was used to sample the endometrium.  Sample was sent for pathologic examination.  Condition: Stable  Complications: None  Plan:  The patient was advised to call for any fever or for prolonged or severe pain or bleeding. She was advised to use OTC analgesics as needed for mild to moderate pain. She was advised to avoid vaginal intercourse for 48 hours or until the bleeding has completely stopped.  Attending Physician Documentation: I performed the endometrial biopsy

## 2017-10-21 ENCOUNTER — Encounter (HOSPITAL_COMMUNITY): Payer: BLUE CROSS/BLUE SHIELD | Admitting: Occupational Therapy

## 2017-10-27 ENCOUNTER — Encounter: Payer: Self-pay | Admitting: Obstetrics & Gynecology

## 2017-10-27 ENCOUNTER — Ambulatory Visit (INDEPENDENT_AMBULATORY_CARE_PROVIDER_SITE_OTHER): Payer: BLUE CROSS/BLUE SHIELD | Admitting: Obstetrics & Gynecology

## 2017-10-27 VITALS — BP 140/81 | HR 75 | Ht 67.0 in | Wt 248.0 lb

## 2017-10-27 DIAGNOSIS — R9389 Abnormal findings on diagnostic imaging of other specified body structures: Secondary | ICD-10-CM

## 2017-10-27 DIAGNOSIS — N950 Postmenopausal bleeding: Secondary | ICD-10-CM | POA: Diagnosis not present

## 2017-10-27 NOTE — Progress Notes (Signed)
Follow up appointment for results  Chief Complaint  Patient presents with  . Follow-up    Blood pressure 140/81, pulse 75, height 5\' 7"  (1.702 m), weight 248 lb (112.5 kg).  Endometrial biopsy reveals benign endometrial tissue no hyperplasia or atypia is noted  As a result no therapy needed since not proliferative, progesterone would not be of benefit in this setting  MEDS ordered this encounter: No orders of the defined types were placed in this encounter.   Orders for this encounter: No orders of the defined types were placed in this encounter.   Impression: PMB with benign pathology No indication for any chronic therapy  Plan: No therapy is indicated at this time  Follow Up: Return in about 2 years (around 10/28/2019) for yearly.       Face to face time:  10 minutes  Greater than 50% of the visit time was spent in counseling and coordination of care with the patient.  The summary and outline of the counseling and care coordination is summarized in the note above.   All questions were answered.  Past Medical History:  Diagnosis Date  . Back pain   . Body aches 11/21/2014  . BV (bacterial vaginosis) 05/23/2013  . Constipation 11/21/2014  . Elevated cholesterol 11/02/2013  . Fibroids 03/13/2016  . Hematuria 05/23/2013  . Hyperlipidemia   . Hypertension   . Migraines   . PAF (paroxysmal atrial fibrillation) ([place])    a. diagnosed in 11/2016 --> started on Xarelto for anticoagulation  . Pelvic pain in female 11/02/2013  . Plantar fasciitis of right foot   . Thyroid disease   . Vaginal discharge 03/24/2014  . Vaginal irritation 05/23/2013  . Vaginal itching 03/24/2014    Past Surgical History:  Procedure Laterality Date  . ECTOPIC PREGNANCY SURGERY    . ESOPHAGOGASTRODUODENOSCOPY  12/19/2009   YQI:HKVQQV stricture s/p dilation/mild gastritis  . ESOPHAGOGASTRODUODENOSCOPY N/A 12/10/2015   Procedure: ESOPHAGOGASTRODUODENOSCOPY (EGD);  Surgeon: Danie Binder,  MD;  [facility];  Service: Endoscopy;  Laterality: N/A;  830  . ileocolonoscopy  12/19/2009   ZDG:[id number] polyps/mild left-side diverticulosis/hemorrhoids  . KNEE SURGERY    . TUBAL LIGATION      OB History    Gravida  6   Para  3   Term      Preterm      AB  3   Living  3     SAB      TAB      Ectopic  3   Multiple      Live Births  3           Allergies  Allergen Reactions  . Hydralazine Nausea Only  . Prednisone Swelling    Social History   Socioeconomic History  . Marital status: Married    Spouse name: Not on file  . Number of children: Not on file  . Years of education: Not on file  . Highest education level: Not on file  Occupational History  . Not on file  Social Needs  . Financial resource strain: Not on file  . Food insecurity:    Worry: Not on file    Inability: Not on file  . Transportation needs:    Medical: Not on file    Non-medical: Not on file  Tobacco Use  . Smoking status: Former Smoker    Types: Cigarettes    Start date: 01/07/1975    Last attempt to quit: 2005  Years since quitting: 14.8  . Smokeless tobacco: Never Used  Substance and Sexual Activity  . Alcohol use: No    Alcohol/week: 0.0 standard drinks  . Drug use: No  . Sexual activity: Yes    Birth control/protection: Post-menopausal, Surgical    Comment: tubal  Lifestyle  . Physical activity:    Days per week: Not on file    Minutes per session: Not on file  . Stress: Not on file  Relationships  . Social connections:    Talks on phone: Not on file    Gets together: Not on file    Attends religious service: Not on file    Active member of club or organization: Not on file    Attends meetings of clubs or organizations: Not on file    Relationship status: Not on file  Other Topics Concern  . Not on file  Social History Narrative  . Not on file    Family History  Problem Relation Age of Onset  . Heart failure Mother   .  Hypertension Mother   . Diabetes Mother   . Heart failure Father   . Hypertension Father   . Heart failure Brother   . Hypertension Sister   . Other Sister        blocked artery in neck; knee replacement  . Other Brother        triple bypass surgery  . Hypertension Sister   . Diabetes Sister   . Colon cancer Neg Hx

## 2017-10-28 ENCOUNTER — Encounter (HOSPITAL_COMMUNITY): Payer: BLUE CROSS/BLUE SHIELD | Admitting: Occupational Therapy

## 2017-10-30 ENCOUNTER — Encounter: Payer: Self-pay | Admitting: Gastroenterology

## 2017-11-05 DIAGNOSIS — I4891 Unspecified atrial fibrillation: Secondary | ICD-10-CM | POA: Diagnosis not present

## 2017-11-05 DIAGNOSIS — K219 Gastro-esophageal reflux disease without esophagitis: Secondary | ICD-10-CM | POA: Diagnosis not present

## 2017-11-05 DIAGNOSIS — Z1389 Encounter for screening for other disorder: Secondary | ICD-10-CM | POA: Diagnosis not present

## 2017-11-05 DIAGNOSIS — I10 Essential (primary) hypertension: Secondary | ICD-10-CM | POA: Diagnosis not present

## 2017-11-05 DIAGNOSIS — E782 Mixed hyperlipidemia: Secondary | ICD-10-CM | POA: Diagnosis not present

## 2017-11-05 DIAGNOSIS — Z6836 Body mass index (BMI) 36.0-36.9, adult: Secondary | ICD-10-CM | POA: Diagnosis not present

## 2017-11-05 DIAGNOSIS — E039 Hypothyroidism, unspecified: Secondary | ICD-10-CM | POA: Diagnosis not present

## 2017-11-05 DIAGNOSIS — E1140 Type 2 diabetes mellitus with diabetic neuropathy, unspecified: Secondary | ICD-10-CM | POA: Diagnosis not present

## 2017-11-06 DIAGNOSIS — G4733 Obstructive sleep apnea (adult) (pediatric): Secondary | ICD-10-CM | POA: Diagnosis not present

## 2017-11-20 ENCOUNTER — Telehealth: Payer: Self-pay | Admitting: *Deleted

## 2017-11-20 ENCOUNTER — Other Ambulatory Visit: Payer: Self-pay | Admitting: Women's Health

## 2017-11-20 NOTE — Telephone Encounter (Signed)
Patient states she is having vaginal itching but no odor or discharge.  She had BV in September and was prescribed Metronidazole.  She would like that to be prescribed again. Please advise.

## 2017-11-23 ENCOUNTER — Telehealth: Payer: Self-pay | Admitting: *Deleted

## 2017-11-30 DIAGNOSIS — M25512 Pain in left shoulder: Secondary | ICD-10-CM | POA: Diagnosis not present

## 2017-11-30 DIAGNOSIS — M7542 Impingement syndrome of left shoulder: Secondary | ICD-10-CM | POA: Diagnosis not present

## 2017-12-11 DIAGNOSIS — M25572 Pain in left ankle and joints of left foot: Secondary | ICD-10-CM | POA: Diagnosis not present

## 2017-12-11 DIAGNOSIS — M79672 Pain in left foot: Secondary | ICD-10-CM | POA: Diagnosis not present

## 2017-12-11 DIAGNOSIS — M779 Enthesopathy, unspecified: Secondary | ICD-10-CM | POA: Diagnosis not present

## 2017-12-12 DIAGNOSIS — M7542 Impingement syndrome of left shoulder: Secondary | ICD-10-CM | POA: Diagnosis not present

## 2017-12-16 ENCOUNTER — Other Ambulatory Visit: Payer: Self-pay | Admitting: Student

## 2017-12-21 ENCOUNTER — Other Ambulatory Visit (HOSPITAL_COMMUNITY)
Admission: RE | Admit: 2017-12-21 | Discharge: 2017-12-21 | Disposition: A | Discharge status: Home / Self Care | Payer: BLUE CROSS/BLUE SHIELD | Source: Ambulatory Visit | Attending: Adult Health | Admitting: Adult Health

## 2017-12-21 ENCOUNTER — Encounter: Payer: Self-pay | Admitting: Adult Health

## 2017-12-21 ENCOUNTER — Ambulatory Visit (INDEPENDENT_AMBULATORY_CARE_PROVIDER_SITE_OTHER): Payer: BLUE CROSS/BLUE SHIELD | Admitting: Adult Health

## 2017-12-21 VITALS — BP 138/80 | HR 73 | Ht 67.0 in | Wt 249.8 lb

## 2017-12-21 DIAGNOSIS — Z1211 Encounter for screening for malignant neoplasm of colon: Secondary | ICD-10-CM

## 2017-12-21 DIAGNOSIS — N898 Other specified noninflammatory disorders of vagina: Secondary | ICD-10-CM

## 2017-12-21 DIAGNOSIS — Z1212 Encounter for screening for malignant neoplasm of rectum: Secondary | ICD-10-CM | POA: Diagnosis not present

## 2017-12-21 DIAGNOSIS — Z01419 Encounter for gynecological examination (general) (routine) without abnormal findings: Secondary | ICD-10-CM

## 2017-12-21 LAB — HEMOCCULT GUIAC POC 1CARD (OFFICE): Fecal Occult Blood, POC: NEGATIVE

## 2017-12-21 MED ORDER — FLUCONAZOLE 150 MG PO TABS: ORAL_TABLET | ORAL | 1 refills | Status: DC

## 2017-12-21 NOTE — Progress Notes (Signed)
Patient ID: Becky [name], female   DOB: 1960/09/28, 57 y.o.   MRN: [id number] History of Present Illness: Becky [name] is a 57 year old black female, married, PM in for well woman gyn exam and pap. She is still working at [employer].  PCP is Dr Gerarda Fraction.   Current Medications, Allergies, Past Medical History, Past Surgical History, Family History and Social History were reviewed in [facility] record.     Review of Systems:  Patient denies any headaches, hearing loss, fatigue, blurred vision, shortness of breath, chest pain, abdominal pain, problems with bowel movements, urination, or intercourse. No joint pain or mood swings. Itches in vaginal area after sex.   Physical Exam:BP 138/80 (BP Location: Left Arm, Patient Position: Sitting, Cuff Size: Large)   Pulse 73   Ht 5\' 7"  (1.702 m)   Wt 249 lb 12.8 oz (113.3 kg)   BMI 39.12 kg/m  General:  Well developed, well nourished, no acute distress Skin:  Warm and dry Neck:  Midline trachea, normal thyroid, good ROM, no lymphadenopathy Lungs; Clear to auscultation bilaterally Breast:  No dominant palpable mass, retraction, or nipple discharge Cardiovascular: Regular rate and rhythm Abdomen:  Soft, non tender, no hepatosplenomegaly Pelvic:  External genitalia is normal in appearance, no lesions.  The vagina is normal in appearance. Urethra has no lesions or masses. The cervix is bulbous. Pap with HPV performed.  Uterus is felt to be normal size, shape, and contour.  No adnexal masses or tenderness noted.Bladder is non tender, no masses felt. Rectal: Good sphincter tone, no polyps, or hemorrhoids felt.  Hemoccult negative. Extremities/musculoskeletal:  No swelling or varicosities noted, no clubbing or cyanosis Psych:  No mood changes, alert and cooperative,seems happy PHQ 2 score 0. Fall risk is low. Examination chaperoned by Estill Bamberg Rash LPN.  Impression: 1. Encounter for well woman exam with routine gynecological exam   2.  Itching in the vaginal area   3. Screening for colorectal cancer       Plan: Meds ordered this encounter  Medications  . fluconazole (DIFLUCAN) 150 MG tablet    Sig: Take 1 now and repeat 1 in 3 days    Dispense:  2 tablet    Refill:  1    Order Specific Question:   Supervising Provider    Answer:   Florian Buff [[id number]]   Mammogram in January, and yearly Physical in 1 year Pap in 3 if normal Had labs earlier  Colonoscopy per GI

## 2017-12-23 LAB — CYTOLOGY - PAP
Diagnosis: NEGATIVE
HPV: NOT DETECTED

## 2018-01-16 ENCOUNTER — Other Ambulatory Visit: Payer: Self-pay

## 2018-01-16 ENCOUNTER — Encounter (HOSPITAL_COMMUNITY): Payer: Self-pay | Admitting: Emergency Medicine

## 2018-01-16 ENCOUNTER — Ambulatory Visit (HOSPITAL_COMMUNITY)
Admission: EM | Admit: 2018-01-16 | Discharge: 2018-01-16 | Disposition: A | Discharge status: Home / Self Care | Payer: BLUE CROSS/BLUE SHIELD | Attending: Family Medicine | Admitting: Family Medicine

## 2018-01-16 DIAGNOSIS — J019 Acute sinusitis, unspecified: Secondary | ICD-10-CM | POA: Diagnosis not present

## 2018-01-16 LAB — POCT URINALYSIS DIP (DEVICE)
Bilirubin Urine: NEGATIVE
Glucose, UA: NEGATIVE mg/dL
Ketones, ur: NEGATIVE mg/dL
Leukocytes, UA: NEGATIVE
Nitrite: NEGATIVE
Protein, ur: NEGATIVE mg/dL
Specific Gravity, Urine: 1.015 (ref 1.005–1.030)
Urobilinogen, UA: 0.2 mg/dL (ref 0.0–1.0)
pH: 7 (ref 5.0–8.0)

## 2018-01-16 MED ORDER — AMOXICILLIN-POT CLAVULANATE 875-125 MG PO TABS: 1.0000 | ORAL_TABLET | Freq: Two times a day (BID) | ORAL | 0 refills | Status: AC

## 2018-01-16 MED ORDER — CETIRIZINE HCL 10 MG PO CAPS: 10.0000 mg | ORAL_CAPSULE | Freq: Every day | ORAL | 0 refills | Status: DC

## 2018-01-16 MED ORDER — BENZONATATE 200 MG PO CAPS: 200.0000 mg | ORAL_CAPSULE | Freq: Three times a day (TID) | ORAL | 0 refills | Status: AC | PRN

## 2018-01-16 NOTE — ED Provider Notes (Signed)
[facility]    CSN: [id number] Arrival date & time: 01/16/18  1134     History   Chief Complaint Chief Complaint  Patient presents with  . Facial Pain  . Dysuria    HPI Becky [name] is a 58 y.o. female history of DM type II, hypertension, peptic ulcer disease, paroxysmal A. fib on Xarelto presenting today for evaluation of URI symptoms and dysuria.  Patient states that over the past 2 weeks she has had nasal congestion, over the past few days she is started to develop some blood-tinged congestion.  She has also had some right ear pain and sore throat as well as cough.  She denies any fevers.  She has tried Tylenol Cold and flu without relief.  She is also been nervous to try medicines given she is on Xarelto.  She has also started to notice some occasional dysuria with urination beginning yesterday.  She has not had any odor.  Does have a history of BV and yeast.  Some slight itching.  Denies any abnormal discharge.  Denies pelvic pain.  HPI  Past Medical History:  Diagnosis Date  . Back pain   . Body aches 11/21/2014  . BV (bacterial vaginosis) 05/23/2013  . Constipation 11/21/2014  . Elevated cholesterol 11/02/2013  . Fibroids 03/13/2016  . Hematuria 05/23/2013  . Hyperlipidemia   . Hypertension   . Migraines   . PAF (paroxysmal atrial fibrillation) ([place])    a. diagnosed in 11/2016 --> started on Xarelto for anticoagulation  . Pelvic pain in female 11/02/2013  . Plantar fasciitis of right foot   . Thyroid disease   . Vaginal discharge 03/24/2014  . Vaginal irritation 05/23/2013  . Vaginal itching 03/24/2014    Patient Active Problem List   Diagnosis Date Noted  . Vaginal odor 09/24/2017  . Abdominal pain 08/18/2017  . Nausea without vomiting 11/17/2016  . Current use of long term anticoagulation 11/12/2016  . Atrial fibrillation with RVR ([place]) 11/07/2016  . Coronary artery disease due to lipid rich plaque   . RLQ abdominal pain 10/03/2016  . Yeast  infection 03/13/2016  . Fibroids 03/13/2016  . Screening for colorectal cancer 11/28/2015  . Burning with urination 11/28/2015  . Subacute frontal sinusitis 11/28/2015  . Leg cramps 10/31/2015  . Chest pain 10/31/2015  . Varicose 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 78/46/9629  . Body aches 11/21/2014  . Constipation 11/21/2014  . Vaginal itching 03/24/2014  . Vaginal discharge 03/24/2014  . Pelvic pain 11/02/2013  . Elevated cholesterol 11/02/2013  . Low back pain 10/20/2013  . Stiffness of ankle joint 10/20/2013  . Pain in joint, ankle and foot 10/20/2013  . Vaginal irritation 05/23/2013  . Hematuria 05/23/2013  . BV (bacterial vaginosis) 05/23/2013  . HEMATOCHEZIA 12/05/2009  . Dysphagia 12/05/2009  . CHEST WALL PAIN, ANTERIOR 06/23/2007  . LEG PAIN 05/18/2007  . VAGINAL PRURITUS 04/16/2007  . GOITER 03/10/2007  . HYPOTHYROIDISM 03/10/2007  . DIABETES MELLITUS, TYPE II 03/10/2007  . HYPERLIPIDEMIA 03/10/2007  . ANEMIA-IRON DEFICIENCY 03/10/2007  . Essential hypertension 03/10/2007  . RHINITIS, CHRONIC 03/10/2007  . ASTHMA, CHILDHOOD 03/10/2007  . GERD 03/10/2007  . PEPTIC ULCER DISEASE 03/10/2007  . MENOPAUSAL SYNDROME 03/10/2007    Past Surgical History:  Procedure Laterality Date  . ECTOPIC PREGNANCY SURGERY    . ESOPHAGOGASTRODUODENOSCOPY  12/19/2009   [id number] stricture s/p dilation/mild gastritis  . ESOPHAGOGASTRODUODENOSCOPY N/A 12/10/2015   Procedure: ESOPHAGOGASTRODUODENOSCOPY (EGD);  Surgeon: Danie Binder, MD;  Location: [facility];  Service: Endoscopy;  Laterality: N/A;  830  . ileocolonoscopy  12/19/2009   [id number] polyps/mild left-side diverticulosis/hemorrhoids  . KNEE SURGERY    . TUBAL LIGATION      OB History    Gravida  6   Para  3   Term      Preterm      AB  3   Living  3     SAB      TAB      Ectopic  3   Multiple      Live Births  3            Home Medications    Prior to Admission  medications   Medication Sig Start Date End Date Taking? Authorizing Provider  acetaminophen (TYLENOL) 500 MG tablet Take 500 mg by mouth every 6 (six) hours as needed for moderate pain or headache.     [provider]  amoxicillin-clavulanate (AUGMENTIN) 875-125 MG tablet Take 1 tablet by mouth every 12 (twelve) hours for 7 days. 01/16/18 01/23/18  Chelan Heringer C, PA-C  ARMOUR THYROID 90 MG tablet Take 90 mg by mouth daily.  09/04/15   [provider]  benzonatate (TESSALON) 200 MG capsule Take 1 capsule (200 mg total) by mouth 3 (three) times daily as needed for up to 7 days for cough. 01/16/18 01/23/18  Landree Fernholz C, PA-C  budesonide (RHINOCORT AQUA) 32 MCG/ACT nasal spray Place 1 spray into both nostrils daily as needed for rhinitis.    [provider]  Cetirizine HCl 10 MG CAPS Take 1 capsule (10 mg total) by mouth daily for 10 days. 01/16/18 01/26/18  Daiquan Resnik C, PA-C  DEXILANT 60 MG capsule TAKE ONE CAPSULE BY MOUTH EVERY DAY 09/22/17   Mahala Menghini, PA-C  diltiazem (CARDIZEM CD) 120 MG 24 hr capsule Take 1 capsule (120 mg total) daily by mouth. 11/12/16 08/18/17  Strader, Fransisco Hertz, PA-C  diltiazem (CARDIZEM) 30 MG tablet diltiazem 30 mg tablet    [provider]  fluconazole (DIFLUCAN) 150 MG tablet Take 1 now and repeat 1 in 3 days 12/21/17   Estill Dooms, NP  Glycerin-Polysorbate 80 (REFRESH DRY EYE THERAPY OP) Apply 1 drop to eye daily as needed (dry eyes).    [provider]  linaclotide Rolan Lipa) 72 MCG capsule Take 1 capsule (72 mcg total) by mouth daily before breakfast. 02/25/17   Mahala Menghini, PA-C  losartan (COZAAR) 100 MG tablet Take 100 mg by mouth daily. 09/21/14   [provider]  montelukast (SINGULAIR) 10 MG tablet Take 1 tablet by mouth daily. 08/25/16   [provider]  rosuvastatin (CRESTOR) 5 MG tablet Take 1 tablet (5 mg total) by mouth daily. 01/12/17 08/18/17  Herminio Commons, MD    spironolactone (ALDACTONE) 25 MG tablet Take 1 tablet (25 mg total) by mouth daily. 01/30/16 08/18/17  Herminio Commons, MD  spironolactone (ALDACTONE) 25 MG tablet TAKE ONE (1) TABLET BY MOUTH EVERY DAY 03/30/17   Herminio Commons, MD  XARELTO 20 MG TABS tablet TAKE 1 TABLET BY MOUTH ONCE DAILY WITH SUPPER 12/16/17   Ahmed Prima, Fransisco Hertz, PA-C    Family History Family History  Problem Relation Age of Onset  . Heart failure Mother   . Hypertension Mother   . Diabetes Mother   . Heart failure Father   . Hypertension Father   . Heart failure Brother   . Hypertension Sister   . Other  Sister        blocked artery in neck; knee replacement  . Other Brother        triple bypass surgery  . Hypertension Sister   . Diabetes Sister   . Colon cancer Neg Hx     Social History Social History   Tobacco Use  . Smoking status: Former Smoker    Types: Cigarettes    Start date: 01/07/1975    Last attempt to quit: 2005    Years since quitting: 15.0  . Smokeless tobacco: Never Used  Substance Use Topics  . Alcohol use: No    Alcohol/week: 0.0 standard drinks  . Drug use: No     Allergies   Hydralazine and Prednisone   Review of Systems Review of Systems  Constitutional: Negative for activity change, appetite change, chills, fatigue and fever.  HENT: Positive for congestion, rhinorrhea, sinus pressure and sore throat. Negative for ear pain and trouble swallowing.   Eyes: Negative for discharge and redness.  Respiratory: Positive for cough. Negative for chest tightness and shortness of breath.   Cardiovascular: Negative for chest pain.  Gastrointestinal: Negative for abdominal pain, diarrhea, nausea and vomiting.  Genitourinary: Positive for dysuria. Negative for flank pain, frequency, genital sores, hematuria, menstrual problem, vaginal bleeding, vaginal discharge and vaginal pain.  Musculoskeletal: Negative for back pain and myalgias.  Skin: Negative for rash.  Neurological:  Positive for headaches. Negative for dizziness and light-headedness.     Physical Exam Triage Vital Signs ED Triage Vitals  Enc Vitals Group     BP 01/16/18 1232 (!) 159/74     Pulse Rate 01/16/18 1232 71     Resp 01/16/18 1232 18     Temp 01/16/18 1232 97.9 F (36.6 C)     Temp Source 01/16/18 1232 Oral     SpO2 01/16/18 1232 99 %     Weight --      Height --      Head Circumference --      Peak Flow --      Pain Score 01/16/18 1230 7     Pain Loc --      Pain Edu? --      Excl. in [place]? --    No data found.  Updated Vital Signs BP (!) 159/74 (BP Location: Left Arm)   Pulse 71   Temp 97.9 F (36.6 C) (Oral)   Resp 18   SpO2 99%   Visual Acuity Right Eye Distance:   Left Eye Distance:   Bilateral Distance:    Right Eye Near:   Left Eye Near:    Bilateral Near:     Physical Exam Vitals signs and nursing note reviewed.  Constitutional:      General: She is not in acute distress.    Appearance: She is well-developed.  HENT:     Head: Normocephalic and atraumatic.     Ears:     Comments: Bilateral ears without tenderness to palpation of external auricle, tragus and mastoid, EAC's without erythema or swelling, TM's with good bony landmarks and cone of light. Non erythematous.    Nose:     Comments: Nasal mucosa erythematous, swollen turbinates    Mouth/Throat:     Comments: Oral mucosa pink and moist, no tonsillar enlargement or exudate. Posterior pharynx patent and nonerythematous, no uvula deviation or swelling. Normal phonation. Eyes:     Conjunctiva/sclera: Conjunctivae normal.  Neck:     Musculoskeletal: Neck supple.  Cardiovascular:     Rate  and Rhythm: Normal rate and regular rhythm.     Heart sounds: No murmur.  Pulmonary:     Effort: Pulmonary effort is normal. No respiratory distress.     Breath sounds: Normal breath sounds.     Comments: Breathing comfortably at rest, CTABL, no wheezing, rales or other adventitious sounds auscultated Abdominal:       Palpations: Abdomen is soft.     Tenderness: There is no abdominal tenderness.  Genitourinary:    Comments: Deferred Skin:    General: Skin is warm and dry.  Neurological:     Mental Status: She is alert.      UC Treatments / Results  Labs (all labs ordered are listed, but only abnormal results are displayed) Labs Reviewed  POCT URINALYSIS DIP (DEVICE) - Abnormal; Notable for the following components:      Result Value   Hgb urine dipstick MODERATE (*)    All other components within normal limits  CERVICOVAGINAL ANCILLARY ONLY    EKG None  Radiology No results found.  Procedures Procedures (including critical care time)  Medications Ordered in UC Medications - No data to display  Initial Impression / Assessment and Plan / UC Course  I have reviewed the triage vital signs and the nursing notes.  Pertinent labs & imaging results that were available during my care of the patient were reviewed by me and considered in my medical decision making (see chart for details).     URI symptoms x2 weeks, will cover for sinusitis with Augmentin, will have patient restart her cetirizine to with congestion and drainage, Tessalon for cough.  UA negative for signs of UTI, will obtain swab to check for yeast, BV and STDs.  Will hold off on any empiric treatment for this at this time.  Will call patient with results and provide any further treatment if needed.  Continue to monitor, drink plenty of fluids.Discussed strict return precautions. Patient verbalized understanding and is agreeable with plan.  Final Clinical Impressions(s) / UC Diagnoses   Final diagnoses:  Acute sinusitis with symptoms > 10 days     Discharge Instructions     Please begin Augmentin twice daily for the next week Please restart taking your daily cetirizine Tessalon as needed for cough  We will send the swab off to check for yeast and BV we will call you with the results of this incident any further  medicine if needed.  Please follow-up if symptoms not resolving or worsening   ED Prescriptions    Medication Sig Dispense Auth. Provider   amoxicillin-clavulanate (AUGMENTIN) 875-125 MG tablet Take 1 tablet by mouth every 12 (twelve) hours for 7 days. 14 tablet Vida Nicol C, PA-C   Cetirizine HCl 10 MG CAPS Take 1 capsule (10 mg total) by mouth daily for 10 days. 10 capsule Nikie Cid C, PA-C   benzonatate (TESSALON) 200 MG capsule Take 1 capsule (200 mg total) by mouth 3 (three) times daily as needed for up to 7 days for cough. 28 capsule Dickson Kostelnik C, PA-C     Controlled Substance Prescriptions [place] Controlled Substance Registry consulted? Not Applicable   Janith Lima, [place] 01/16/18 1434

## 2018-01-16 NOTE — ED Triage Notes (Signed)
The patient presented to the [facility] with a complaint of sinus pain and pressure with right otalgia and some sore throat x 3 days.   The patient also complained of dysuria x 1 day.

## 2018-01-16 NOTE — Discharge Instructions (Signed)
Please begin Augmentin twice daily for the next week Please restart taking your daily cetirizine Tessalon as needed for cough  We will send the swab off to check for yeast and BV we will call you with the results of this incident any further medicine if needed.  Please follow-up if symptoms not resolving or worsening

## 2018-01-18 ENCOUNTER — Ambulatory Visit: Payer: BLUE CROSS/BLUE SHIELD | Admitting: Cardiovascular Disease

## 2018-01-18 DIAGNOSIS — R0989 Other specified symptoms and signs involving the circulatory and respiratory systems: Secondary | ICD-10-CM

## 2018-01-18 LAB — CERVICOVAGINAL ANCILLARY ONLY
Bacterial vaginitis: NEGATIVE
Candida vaginitis: NEGATIVE
Chlamydia: NEGATIVE
Neisseria Gonorrhea: NEGATIVE
Trichomonas: NEGATIVE

## 2018-01-19 ENCOUNTER — Encounter: Payer: Self-pay | Admitting: Cardiovascular Disease

## 2018-01-22 ENCOUNTER — Emergency Department (HOSPITAL_COMMUNITY): Payer: BLUE CROSS/BLUE SHIELD

## 2018-01-22 ENCOUNTER — Other Ambulatory Visit: Payer: Self-pay

## 2018-01-22 ENCOUNTER — Emergency Department (HOSPITAL_COMMUNITY)
Admission: EM | Admit: 2018-01-22 | Discharge: 2018-01-22 | Disposition: A | Discharge status: Home / Self Care | Payer: BLUE CROSS/BLUE SHIELD | Attending: Emergency Medicine | Admitting: Emergency Medicine

## 2018-01-22 ENCOUNTER — Encounter (HOSPITAL_COMMUNITY): Payer: Self-pay

## 2018-01-22 DIAGNOSIS — Y998 Other external cause status: Secondary | ICD-10-CM | POA: Diagnosis not present

## 2018-01-22 DIAGNOSIS — E785 Hyperlipidemia, unspecified: Secondary | ICD-10-CM | POA: Insufficient documentation

## 2018-01-22 DIAGNOSIS — T148XXA Other injury of unspecified body region, initial encounter: Secondary | ICD-10-CM

## 2018-01-22 DIAGNOSIS — Z79899 Other long term (current) drug therapy: Secondary | ICD-10-CM | POA: Insufficient documentation

## 2018-01-22 DIAGNOSIS — Z87891 Personal history of nicotine dependence: Secondary | ICD-10-CM | POA: Insufficient documentation

## 2018-01-22 DIAGNOSIS — R109 Unspecified abdominal pain: Secondary | ICD-10-CM | POA: Diagnosis not present

## 2018-01-22 DIAGNOSIS — Y9389 Activity, other specified: Secondary | ICD-10-CM | POA: Diagnosis not present

## 2018-01-22 DIAGNOSIS — K5730 Diverticulosis of large intestine without perforation or abscess without bleeding: Secondary | ICD-10-CM | POA: Diagnosis not present

## 2018-01-22 DIAGNOSIS — Y9289 Other specified places as the place of occurrence of the external cause: Secondary | ICD-10-CM | POA: Diagnosis not present

## 2018-01-22 DIAGNOSIS — S39011A Strain of muscle, fascia and tendon of abdomen, initial encounter: Secondary | ICD-10-CM | POA: Diagnosis not present

## 2018-01-22 DIAGNOSIS — I10 Essential (primary) hypertension: Secondary | ICD-10-CM | POA: Diagnosis not present

## 2018-01-22 DIAGNOSIS — K760 Fatty (change of) liver, not elsewhere classified: Secondary | ICD-10-CM | POA: Diagnosis not present

## 2018-01-22 DIAGNOSIS — X58XXXA Exposure to other specified factors, initial encounter: Secondary | ICD-10-CM | POA: Insufficient documentation

## 2018-01-22 LAB — COMPREHENSIVE METABOLIC PANEL
ALT: 21 U/L (ref 0–44)
AST: 17 U/L (ref 15–41)
Albumin: 4 g/dL (ref 3.5–5.0)
Alkaline Phosphatase: 63 U/L (ref 38–126)
Anion gap: 3 — ABNORMAL LOW (ref 5–15)
BUN: 13 mg/dL (ref 6–20)
CO2: 25 mmol/L (ref 22–32)
Calcium: 9.1 mg/dL (ref 8.9–10.3)
Chloride: 108 mmol/L (ref 98–111)
Creatinine, Ser: 0.74 mg/dL (ref 0.44–1.00)
GFR calc Af Amer: >60 mL/min (ref >60)
GFR calc non Af Amer: >60 mL/min (ref >60)
Glucose, Bld: 107 mg/dL — ABNORMAL HIGH (ref 70–99)
Potassium: 3.9 mmol/L (ref 3.5–5.1)
Sodium: 136 mmol/L (ref 135–145)
Total Bilirubin: 0.6 mg/dL (ref 0.3–1.2)
Total Protein: 7 g/dL (ref 6.5–8.1)

## 2018-01-22 LAB — URINALYSIS, ROUTINE W REFLEX MICROSCOPIC
Bacteria, UA: NONE SEEN
Bilirubin Urine: NEGATIVE
Glucose, UA: >=500 mg/dL — AB
Hgb urine dipstick: NEGATIVE
Ketones, ur: NEGATIVE mg/dL
Leukocytes, UA: NEGATIVE
Nitrite: NEGATIVE
Protein, ur: 100 mg/dL — AB
Specific Gravity, Urine: 1.025 (ref 1.005–1.030)
pH: 5 (ref 5.0–8.0)

## 2018-01-22 LAB — CBC
HCT: 38.9 % (ref 36.0–46.0)
Hemoglobin: 12.4 g/dL (ref 12.0–15.0)
MCH: 30 pg (ref 26.0–34.0)
MCHC: 31.9 g/dL (ref 30.0–36.0)
MCV: 94.2 fL (ref 80.0–100.0)
Platelets: 351 10*3/uL (ref 150–400)
RBC: 4.13 MIL/uL (ref 3.87–5.11)
RDW: 12.4 % (ref 11.5–15.5)
WBC: 6.4 10*3/uL (ref 4.0–10.5)
nRBC: 0 % (ref 0.0–0.2)

## 2018-01-22 LAB — LIPASE, BLOOD: Lipase: 22 U/L (ref 11–51)

## 2018-01-22 MED ORDER — TRAMADOL HCL 50 MG PO TABS
100.0000 mg | ORAL_TABLET | Freq: Once | ORAL | Status: AC
  Administered 2018-01-22: 100 mg via ORAL
  Filled 2018-01-22: qty 2

## 2018-01-22 MED ORDER — METHOCARBAMOL 500 MG PO TABS: 500.0000 mg | ORAL_TABLET | Freq: Three times a day (TID) | ORAL | 0 refills | Status: DC

## 2018-01-22 MED ORDER — TRAMADOL HCL 50 MG PO TABS: ORAL_TABLET | ORAL | 0 refills | Status: DC

## 2018-01-22 MED ORDER — METHOCARBAMOL 500 MG PO TABS
500.0000 mg | ORAL_TABLET | Freq: Once | ORAL | Status: AC
  Administered 2018-01-22: 500 mg via ORAL
  Filled 2018-01-22: qty 1

## 2018-01-22 NOTE — Discharge Instructions (Signed)
Your lab work is negative for acute problem. Your CT scan is negative for kidney stone, hematoma, or other acute problem. Your exam suggest muscle strain on the right. Please rest your back as much as possible. Use a heating pad to the affected area. Use robaxin three times daily. Use tylenol extra strength for mild pain. Use Ultram for more severe pain.Use Robaxin for spasm pain.This medication may cause drowsiness. Please do not drink, drive, or participate in activity that requires concentration while taking this medication.  Please see your MD for follow up of the glucose and glucose in your urine, as well as your fatigue issue.

## 2018-01-22 NOTE — ED Notes (Signed)
HB in to discuss findings

## 2018-01-22 NOTE — ED Triage Notes (Signed)
Pt is having right flank pain. Pain started last night. Denies any urinary symptoms.

## 2018-01-22 NOTE — ED Provider Notes (Signed)
[facility] EMERGENCY DEPARTMENT Provider Note   CSN: [id number] Arrival date & time: 01/22/18  1331     History   Chief Complaint Chief Complaint  Patient presents with  . Flank Pain    HPI Becky [name] is a 58 y.o. female.  Patient is a 58 year old female who presents to the emergency department with a complaint of right flank area pain.  The patient states this problem started on last evening, January 16 while she was at work.  The patient states that her job involves a lot of pulling and at times straining.  She denies any urinary symptoms.  No high fever reported.  It is of note the patient was recently treated with antibiotics for sinusitis and dysuria.  Patient was placed on Augmentin and decongesting medication.  No high fevers reported.  No blood in the urine.  No history of kidney stones.  The history is provided by the patient.  Flank Pain  Pertinent negatives include no chest pain, no abdominal pain and no shortness of breath.    Past Medical History:  Diagnosis Date  . Back pain   . Body aches 11/21/2014  . BV (bacterial vaginosis) 05/23/2013  . Constipation 11/21/2014  . Elevated cholesterol 11/02/2013  . Fibroids 03/13/2016  . Hematuria 05/23/2013  . Hyperlipidemia   . Hypertension   . Migraines   . PAF (paroxysmal atrial fibrillation) ([place])    a. diagnosed in 11/2016 --> started on Xarelto for anticoagulation  . Pelvic pain in female 11/02/2013  . Plantar fasciitis of right foot   . Thyroid disease   . Vaginal discharge 03/24/2014  . Vaginal irritation 05/23/2013  . Vaginal itching 03/24/2014    Patient Active Problem List   Diagnosis Date Noted  . Vaginal odor 09/24/2017  . Abdominal pain 08/18/2017  . Nausea without vomiting 11/17/2016  . Current use of long term anticoagulation 11/12/2016  . Atrial fibrillation with RVR ([place]) 11/07/2016  . Coronary artery disease due to lipid rich plaque   . RLQ abdominal pain 10/03/2016  . Yeast infection  03/13/2016  . Fibroids 03/13/2016  . Screening for colorectal cancer 11/28/2015  . Burning with urination 11/28/2015  . Subacute frontal sinusitis 11/28/2015  . Leg cramps 10/31/2015  . Chest pain 10/31/2015  . Varicose 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 27/25/3664  . Body aches 11/21/2014  . Constipation 11/21/2014  . Vaginal itching 03/24/2014  . Vaginal discharge 03/24/2014  . Pelvic pain 11/02/2013  . Elevated cholesterol 11/02/2013  . Low back pain 10/20/2013  . Stiffness of ankle joint 10/20/2013  . Pain in joint, ankle and foot 10/20/2013  . Vaginal irritation 05/23/2013  . Hematuria 05/23/2013  . BV (bacterial vaginosis) 05/23/2013  . HEMATOCHEZIA 12/05/2009  . Dysphagia 12/05/2009  . CHEST WALL PAIN, ANTERIOR 06/23/2007  . LEG PAIN 05/18/2007  . VAGINAL PRURITUS 04/16/2007  . GOITER 03/10/2007  . HYPOTHYROIDISM 03/10/2007  . DIABETES MELLITUS, TYPE II 03/10/2007  . HYPERLIPIDEMIA 03/10/2007  . ANEMIA-IRON DEFICIENCY 03/10/2007  . Essential hypertension 03/10/2007  . RHINITIS, CHRONIC 03/10/2007  . ASTHMA, CHILDHOOD 03/10/2007  . GERD 03/10/2007  . PEPTIC ULCER DISEASE 03/10/2007  . MENOPAUSAL SYNDROME 03/10/2007    Past Surgical History:  Procedure Laterality Date  . ECTOPIC PREGNANCY SURGERY    . ESOPHAGOGASTRODUODENOSCOPY  12/19/2009   QIH:[id number] stricture s/p dilation/mild gastritis  . ESOPHAGOGASTRODUODENOSCOPY N/A 12/10/2015   Procedure: ESOPHAGOGASTRODUODENOSCOPY (EGD);  Surgeon: Danie Binder, MD;  Location: [facility];  Service: Endoscopy;  Laterality:  N/A;  830  . ileocolonoscopy  12/19/2009   [id number] polyps/mild left-side diverticulosis/hemorrhoids  . KNEE SURGERY    . TUBAL LIGATION       OB History    Gravida  6   Para  3   Term      Preterm      AB  3   Living  3     SAB      TAB      Ectopic  3   Multiple      Live Births  3            Home Medications    Prior to Admission medications    Medication Sig Start Date End Date Taking? Authorizing Provider  acetaminophen (TYLENOL) 500 MG tablet Take 500 mg by mouth every 6 (six) hours as needed for moderate pain or headache.     [provider]  amoxicillin-clavulanate (AUGMENTIN) 875-125 MG tablet Take 1 tablet by mouth every 12 (twelve) hours for 7 days. 01/16/18 01/23/18  Wieters, Hallie C, PA-C  ARMOUR THYROID 90 MG tablet Take 90 mg by mouth daily.  09/04/15   [provider]  benzonatate (TESSALON) 200 MG capsule Take 1 capsule (200 mg total) by mouth 3 (three) times daily as needed for up to 7 days for cough. 01/16/18 01/23/18  Wieters, Hallie C, PA-C  budesonide (RHINOCORT AQUA) 32 MCG/ACT nasal spray Place 1 spray into both nostrils daily as needed for rhinitis.    [provider]  Cetirizine HCl 10 MG CAPS Take 1 capsule (10 mg total) by mouth daily for 10 days. 01/16/18 01/26/18  Wieters, Hallie C, PA-C  DEXILANT 60 MG capsule TAKE ONE CAPSULE BY MOUTH EVERY DAY 09/22/17   Mahala Menghini, PA-C  diltiazem (CARDIZEM CD) 120 MG 24 hr capsule Take 1 capsule (120 mg total) daily by mouth. 11/12/16 08/18/17  Strader, Fransisco Hertz, PA-C  diltiazem (CARDIZEM) 30 MG tablet diltiazem 30 mg tablet    [provider]  fluconazole (DIFLUCAN) 150 MG tablet Take 1 now and repeat 1 in 3 days 12/21/17   Estill Dooms, NP  Glycerin-Polysorbate 80 (REFRESH DRY EYE THERAPY OP) Apply 1 drop to eye daily as needed (dry eyes).    [provider]  linaclotide Rolan Lipa) 72 MCG capsule Take 1 capsule (72 mcg total) by mouth daily before breakfast. 02/25/17   Mahala Menghini, PA-C  losartan (COZAAR) 100 MG tablet Take 100 mg by mouth daily. 09/21/14   [provider]  montelukast (SINGULAIR) 10 MG tablet Take 1 tablet by mouth daily. 08/25/16   [provider]  rosuvastatin (CRESTOR) 5 MG tablet Take 1 tablet (5 mg total) by mouth daily. 01/12/17 08/18/17  Herminio Commons, MD  spironolactone  (ALDACTONE) 25 MG tablet Take 1 tablet (25 mg total) by mouth daily. 01/30/16 08/18/17  Herminio Commons, MD  spironolactone (ALDACTONE) 25 MG tablet TAKE ONE (1) TABLET BY MOUTH EVERY DAY 03/30/17   Herminio Commons, MD  XARELTO 20 MG TABS tablet TAKE 1 TABLET BY MOUTH ONCE DAILY WITH SUPPER 12/16/17   Ahmed Prima, Fransisco Hertz, PA-C    Family History Family History  Problem Relation Age of Onset  . Heart failure Mother   . Hypertension Mother   . Diabetes Mother   . Heart failure Father   . Hypertension Father   . Heart failure Brother   . Hypertension Sister   . Other Sister  blocked artery in neck; knee replacement  . Other Brother        triple bypass surgery  . Hypertension Sister   . Diabetes Sister   . Colon cancer Neg Hx     Social History Social History   Tobacco Use  . Smoking status: Former Smoker    Types: Cigarettes    Start date: 01/07/1975    Last attempt to quit: 2005    Years since quitting: 15.0  . Smokeless tobacco: Never Used  Substance Use Topics  . Alcohol use: No    Alcohol/week: 0.0 standard drinks  . Drug use: No     Allergies   Hydralazine and Prednisone   Review of Systems Review of Systems  Constitutional: Negative for activity change.       All ROS Neg except as noted in HPI  HENT: Positive for congestion. Negative for nosebleeds.   Eyes: Negative for photophobia and discharge.  Respiratory: Negative for cough, shortness of breath and wheezing.   Cardiovascular: Negative for chest pain and palpitations.  Gastrointestinal: Negative for abdominal pain and blood in stool.  Genitourinary: Positive for flank pain. Negative for dysuria, frequency and hematuria.  Musculoskeletal: Negative for arthralgias, back pain and neck pain.  Skin: Negative.   Neurological: Negative for dizziness, seizures and speech difficulty.  Psychiatric/Behavioral: Negative for confusion and hallucinations.     Physical Exam Updated Vital Signs BP (!)  143/87 (BP Location: Right Arm)   Pulse 66   Temp 98.1 F (36.7 C) (Oral)   Resp 12   Ht 5\' 7"  (1.702 m)   Wt 112.5 kg   SpO2 98%   BMI 38.84 kg/m   Physical Exam Vitals signs and nursing note reviewed.  Constitutional:      Appearance: She is well-developed. She is not toxic-appearing.  HENT:     Head: Normocephalic.     Right Ear: Tympanic membrane and external ear normal.     Left Ear: Tympanic membrane and external ear normal.     Nose: Congestion present.  Eyes:     General: Lids are normal.     Pupils: Pupils are equal, round, and reactive to light.  Neck:     Musculoskeletal: Normal range of motion and neck supple.     Vascular: No carotid bruit.  Cardiovascular:     Rate and Rhythm: Normal rate and regular rhythm.     Pulses: Normal pulses.     Heart sounds: Normal heart sounds.  Pulmonary:     Effort: No respiratory distress.     Breath sounds: Normal breath sounds.     Comments: Coarse breath sounds.  There is symmetrical rise and fall of the chest.  The patient speaks in complete sentences without problem. Abdominal:     General: Bowel sounds are normal.     Palpations: Abdomen is soft.     Tenderness: There is no abdominal tenderness. There is no guarding.     Comments: Right flank area pain with attempted range of motion.  No abdominal pain appreciated.  Musculoskeletal: Normal range of motion.  Lymphadenopathy:     Head:     Right side of head: No submandibular adenopathy.     Left side of head: No submandibular adenopathy.     Cervical: No cervical adenopathy.  Skin:    General: Skin is warm and dry.  Neurological:     Mental Status: She is alert and oriented to person, place, and time.     Cranial Nerves:  No cranial nerve deficit.     Sensory: No sensory deficit.  Psychiatric:        Speech: Speech normal.      ED Treatments / Results  Labs (all labs ordered are listed, but only abnormal results are displayed) Labs Reviewed  COMPREHENSIVE  METABOLIC PANEL - Abnormal; Notable for the following components:      Result Value   Glucose, Bld 107 (*)    Anion gap 3 (*)    All other components within normal limits  URINALYSIS, ROUTINE W REFLEX MICROSCOPIC - Abnormal; Notable for the following components:   APPearance HAZY (*)    Glucose, UA >=500 (*)    Protein, ur 100 (*)    All other components within normal limits  LIPASE, BLOOD  CBC    EKG None  Radiology No results found.  Procedures Procedures (including critical care time)  Medications Ordered in ED Medications - No data to display   Initial Impression / Assessment and Plan / ED Course  I have reviewed the triage vital signs and the nursing notes.  Pertinent labs & imaging results that were available during my care of the patient were reviewed by me and considered in my medical decision making (see chart for details).      Final Clinical Impressions(s) / ED Diagnoses MDm  Vital signs reviewed.  Pulse oximetry is within normal limits by my interpretation.  Lipase is normal at 22.  The comprehensive metabolic panel is nonacute.  The anion gap is low at 3.  Complete blood count is well within normal limits.  Urine analysis shows a hazy yellow specimen with a glucose greater than 500 mg/daL.  There is 100 mg/daL of protein.  There are 11-20 white blood cells, and budding yeast present.  Given the flank pain, the patient will receive a CT stone study to rule out an infected stone, Pt on Xaralto will eval for hematoma.  The CT stone study shows no urolithiasis, and no hydronephrosis.  There is no bowel obstruction or acute bowel inflammation.  The appendix appears to be normal.  There is some left colonic diverticulosis, but no evidence of diverticulitis.  There is noted uterine fibroid present.  The examination favors musculoskeletal pain.  The patient will be treated with muscle relaxer and medication for pain.  I have asked the patient to use a heating pad to  the area.  Patient is to follow-up with her primary physician if any changes in condition, problems, or concerns.   Final diagnoses:  Muscle strain    ED Discharge Orders    None       Lily Kocher, PA-C 01/22/18 1715    Fredia Sorrow, MD 01/26/18 [phone]

## 2018-01-25 DIAGNOSIS — M546 Pain in thoracic spine: Secondary | ICD-10-CM | POA: Diagnosis not present

## 2018-01-25 DIAGNOSIS — Z1389 Encounter for screening for other disorder: Secondary | ICD-10-CM | POA: Diagnosis not present

## 2018-01-25 DIAGNOSIS — Z6836 Body mass index (BMI) 36.0-36.9, adult: Secondary | ICD-10-CM | POA: Diagnosis not present

## 2018-01-25 DIAGNOSIS — E6609 Other obesity due to excess calories: Secondary | ICD-10-CM | POA: Diagnosis not present

## 2018-02-16 ENCOUNTER — Encounter: Payer: Self-pay | Admitting: Nurse Practitioner

## 2018-02-16 ENCOUNTER — Ambulatory Visit: Payer: BLUE CROSS/BLUE SHIELD | Admitting: Nurse Practitioner

## 2018-02-16 ENCOUNTER — Encounter: Payer: Self-pay | Admitting: Gastroenterology

## 2018-02-16 VITALS — BP 171/83 | HR 68 | Temp 97.9°F | Ht 67.0 in | Wt 248.4 lb

## 2018-02-16 DIAGNOSIS — K5900 Constipation, unspecified: Secondary | ICD-10-CM

## 2018-02-16 DIAGNOSIS — K219 Gastro-esophageal reflux disease without esophagitis: Secondary | ICD-10-CM

## 2018-02-16 DIAGNOSIS — R1031 Right lower quadrant pain: Secondary | ICD-10-CM

## 2018-02-16 NOTE — Patient Instructions (Signed)
Your health issues we discussed today were:   Constipation: 1. I am increasing Linzess to 145 mcg.  Take this once a day on an empty stomach 2. I am giving you samples last 1 or 2 weeks. 3. Call us in 1 to 2 weeks and let us know if it is helping her constipation  Heartburn (GERD): 1. Continue taking Dexilant.  Try to aim for better adherence to taking your medicine every day 2. We discussed some options including leaving a pill bottle or a reminder note in an area that she visit every morning upon waking 3. Call us if you have any worsening heartburn  Abdominal pain: 1. Continue to work with your primary care provider to evaluate your pain 2. Constipation could be contributing to this. 3. Hopefully if we get your constipation better controlled it will help  Overall I recommend:  1. Follow-up in 6 months 2. Call us with any questions or concerns.  At [facility] we value your feedback. You may receive a survey about your visit today. Please share your experience as we strive to create trusting relationships with our patients to provide genuine, compassionate, quality care.  We appreciate your understanding and patience as we review any laboratory studies, imaging, and other diagnostic tests that are ordered as we care for you. Our office policy is 5 business days for review of these results, and any emergent or urgent results are addressed in a timely manner for your best interest. If you do not hear from our office in 1 week, please contact us.   We also encourage the use of MyChart, which contains your medical information for your review as well. If you are not enrolled in this feature, an access code is on this after visit summary for your convenience. Thank you for allowing [place] to be involved in your care.  It was great to see you today!  I hope you have a great day!!

## 2018-02-16 NOTE — Assessment & Plan Note (Signed)
Persistent, intermittent right lower quadrant pain.  Her primary care is working up further.  Has been found to have mild uterine fibroids on CT imaging.  Multiple orthopedic x-rays have been taken as well.  I do not feel her constipation is the primary driver of her abdominal pain but it could be contributing if it is not adequately managed.  Hopefully better management of her constipation will help with her pain somewhat.  Follow-up in 6 months.

## 2018-02-16 NOTE — Assessment & Plan Note (Signed)
Constipation not adequately managed.  She does still have some breakthrough constipation.  She is tried Orthoptist and MiraLAX on top of her Linzess 72 mcg but this is not helped much.  We will increase her Linzess to 145 mcg and I will provide samples for 1 to 2 weeks and request a progress report in 1 to 2 weeks.  If this improves her constipation management we can send in a new prescription.  Follow-up in 6 months.

## 2018-02-16 NOTE — Assessment & Plan Note (Signed)
Overall heartburn is well managed on Dexilant, no longer taking Zantac.  She does occasionally have breakthrough but is typically if she misses a dose of medication.  We discussed several options to help increase her compliance and remind her to take her medicine in the morning.  Continue current medications and follow-up in 6 months.

## 2018-02-16 NOTE — Progress Notes (Signed)
Referring Provider: Redmond School, MD Primary Care Physician:  Redmond School, MD Primary GI:  Dr. Oneida Alar  Chief Complaint  Patient presents with  . Gastroesophageal Reflux  . Constipation    HPI:   Becky [name] is a 58 y.o. female who presents for follow-up on GERD and constipation.  Patient was last seen in our office 08/18/2017 for the same as well as right lower quadrant abdominal pain, nausea and vomiting.  Colonoscopy and EGD up-to-date on file.  Previously had success with Linzess 72 mcg.  Dexilant and Zantac previously controlled her GERD symptoms.  At her last visit she was having right lower quadrant abdominal pain and took a Linzess which made it worse.  Primary care ordered a CT scan which was upcoming.  Still with intermittent constipation but has not tried Colace yet per previous recommendations.  She ran out of Munjor.  However, she got a refill a couple weeks ago and started taking again and her constipation improved.  Intermittent nausea thinks it is related to sinus issues and requesting nausea medication refill which was provided.  GERD doing well and wondering if she still needs Zantac.  No other GI symptoms.  Recommended continue current medications, use Colace or MiraLAX as needed for breakthrough constipation, have CT as ordered by PCP, follow-up in 6 months.  CT of the abdomen and pelvis with contrast completed 08/24/2017 which found no acute abnormalities in the abdomen or pelvis, mild diverticulosis of the left-sided colon, mild hepatic steatosis, small benign left adrenal adenoma.  CT renal stone study completed 01/22/2018 found no urolithiasis but stable lobulated uterine contour suggesting small uterine fibroids.  Today she states she's doing ok overall. Still some breakthrough constipation with colace/MiraLAX in addition to Linzess 72 mcg. Is ok with trying to increase Linzess. Having some breakthrough GERD, typically if she misses a dose of Dexilant. Still  with some RLQ abdominal pain undergoing workup by PCP. Query possible constipation contributing to her pain. Has intermittent nausea, thinks it's related to sinuses; has nausea medication which helps. Denies hematochezia, melena, fever, chills, unintentional weight loss. She did have some rectal itching for which she used Preparation H which helped. Not an ongoing problem. Denies chest pain, dyspnea, dizziness, lightheadedness, syncope, near syncope. Denies any other upper or lower GI symptoms.  Past Medical History:  Diagnosis Date  . Back pain   . Body aches 11/21/2014  . BV (bacterial vaginosis) 05/23/2013  . Constipation 11/21/2014  . Elevated cholesterol 11/02/2013  . Fibroids 03/13/2016  . Hematuria 05/23/2013  . Hyperlipidemia   . Hypertension   . Migraines   . PAF (paroxysmal atrial fibrillation) ([place])    a. diagnosed in 11/2016 --> started on Xarelto for anticoagulation  . Pelvic pain in female 11/02/2013  . Plantar fasciitis of right foot   . Thyroid disease   . Vaginal discharge 03/24/2014  . Vaginal irritation 05/23/2013  . Vaginal itching 03/24/2014    Past Surgical History:  Procedure Laterality Date  . ECTOPIC PREGNANCY SURGERY    . ESOPHAGOGASTRODUODENOSCOPY  12/19/2009   [id number]:[id number] stricture s/p dilation/mild gastritis  . ESOPHAGOGASTRODUODENOSCOPY N/A 12/10/2015   Procedure: ESOPHAGOGASTRODUODENOSCOPY (EGD);  Surgeon: Danie Binder, MD;  Location: [facility];  Service: Endoscopy;  Laterality: N/A;  830  . ileocolonoscopy  12/19/2009   [id number] polyps/mild left-side diverticulosis/hemorrhoids  . KNEE SURGERY    . TUBAL LIGATION      Current Outpatient Medications  Medication Sig Dispense Refill  . acetaminophen (TYLENOL) 500 MG  tablet Take 500 mg by mouth every 6 (six) hours as needed for moderate pain or headache.     [name] THYROID 90 MG tablet Take 90 mg by mouth daily.   4  . budesonide (RHINOCORT AQUA) 32 MCG/ACT nasal spray Place 1 spray into both  nostrils daily as needed for rhinitis.    Marland Kitchen DEXILANT 60 MG capsule TAKE ONE CAPSULE BY MOUTH EVERY DAY 30 capsule 11  . diltiazem (CARDIZEM CD) 120 MG 24 hr capsule Take 1 capsule (120 mg total) daily by mouth. 90 capsule 3  . Glycerin-Polysorbate 80 (REFRESH DRY EYE THERAPY OP) Apply 1 drop to eye daily as needed (dry eyes).    Marland Kitchen linaclotide (LINZESS) 72 MCG capsule Take 1 capsule (72 mcg total) by mouth daily before breakfast. 90 capsule 3  . losartan (COZAAR) 100 MG tablet Take 100 mg by mouth daily.    . methocarbamol (ROBAXIN) 500 MG tablet Take 1 tablet (500 mg total) by mouth 3 (three) times daily. 21 tablet 0  . montelukast (SINGULAIR) 10 MG tablet Take 1 tablet by mouth daily.    . rosuvastatin (CRESTOR) 5 MG tablet Take 1 tablet (5 mg total) by mouth daily. 90 tablet 3  . spironolactone (ALDACTONE) 25 MG tablet Take 1 tablet (25 mg total) by mouth daily. 90 tablet 3  . spironolactone (ALDACTONE) 25 MG tablet TAKE ONE (1) TABLET BY MOUTH EVERY DAY 90 tablet 3  . traMADol (ULTRAM) 50 MG tablet 1 or 2 po q6h prn pain 15 tablet 0  . XARELTO 20 MG TABS tablet TAKE 1 TABLET BY MOUTH ONCE DAILY WITH SUPPER 90 tablet 3  . Cetirizine HCl 10 MG CAPS Take 1 capsule (10 mg total) by mouth daily for 10 days. (Patient not taking: Reported on 02/16/2018) 10 capsule 0   No current facility-administered medications for this visit.     Allergies as of 02/16/2018 - Review Complete 02/16/2018  Allergen Reaction Noted  . Hydralazine Nausea Only 01/30/2016  . Prednisone Swelling 01/30/2015    Family History  Problem Relation Age of Onset  . Heart failure Mother   . Hypertension Mother   . Diabetes Mother   . Heart failure Father   . Hypertension Father   . Heart failure Brother   . Hypertension Sister   . Other Sister        blocked artery in neck; knee replacement  . Other Brother        triple bypass surgery  . Hypertension Sister   . Diabetes Sister   . Colon cancer Neg Hx     Social  History   Socioeconomic History  . Marital status: Married    Spouse name: Not on file  . Number of children: Not on file  . Years of education: Not on file  . Highest education level: Not on file  Occupational History  . Not on file  Social Needs  . Financial resource strain: Not on file  . Food insecurity:    Worry: Not on file    Inability: Not on file  . Transportation needs:    Medical: Not on file    Non-medical: Not on file  Tobacco Use  . Smoking status: Former Smoker    Types: Cigarettes    Start date: 01/07/1975    Last attempt to quit: 2005    Years since quitting: 15.1  . Smokeless tobacco: Never Used  Substance and Sexual Activity  . Alcohol use: No  Alcohol/week: 0.0 standard drinks  . Drug use: No  . Sexual activity: Yes    Birth control/protection: Post-menopausal, Surgical    Comment: tubal  Lifestyle  . Physical activity:    Days per week: Not on file    Minutes per session: Not on file  . Stress: Not on file  Relationships  . Social connections:    Talks on phone: Not on file    Gets together: Not on file    Attends religious service: Not on file    Active member of club or organization: Not on file    Attends meetings of clubs or organizations: Not on file    Relationship status: Not on file  Other Topics Concern  . Not on file  Social History Narrative  . Not on file    Review of Systems: Complete ROS negative except as per HPI.   Physical Exam: BP (!) 171/83   Pulse 68   Temp 97.9 F (36.6 C) (Oral)   Ht 5\' 7"  (1.702 m)   Wt 248 lb 6.4 oz (112.7 kg)   BMI 38.90 kg/m  General:   Alert and oriented. Pleasant and cooperative. Well-nourished and well-developed.  Eyes:  Without icterus, sclera clear and conjunctiva pink.  Ears:  Normal auditory acuity. Cardiovascular:  S1, S2 present without murmurs appreciated. Extremities without clubbing or edema. Respiratory:  Clear to auscultation bilaterally. No wheezes, rales, or rhonchi. No  distress.  Gastrointestinal:  +BS, soft, non-tender and non-distended. No HSM noted. No guarding or rebound. No masses appreciated.  Rectal:  Deferred  Musculoskalatal:  Symmetrical without gross deformities. Neurologic:  Alert and oriented x4;  grossly normal neurologically. Psych:  Alert and cooperative. Normal mood and affect. Heme/Lymph/Immune: No excessive bruising noted.    02/16/2018 2:13 PM   Disclaimer: This note was dictated with voice recognition software. Similar sounding words can inadvertently be transcribed and may not be corrected upon review.

## 2018-02-17 NOTE — Progress Notes (Signed)
CC'D TO PCP °

## 2018-02-23 ENCOUNTER — Telehealth: Payer: Self-pay

## 2018-02-23 ENCOUNTER — Telehealth: Payer: Self-pay | Admitting: Gastroenterology

## 2018-02-23 DIAGNOSIS — K5900 Constipation, unspecified: Secondary | ICD-10-CM

## 2018-02-23 NOTE — Telephone Encounter (Signed)
Pt said the Linzess 145 mcg are working well and she needs an Rx sent in for it. Forwarding to Walden Field, NP who saw her in the office.

## 2018-02-23 NOTE — Telephone Encounter (Signed)
See previous note

## 2018-02-23 NOTE — Telephone Encounter (Signed)
Pt was returning a call from DS. Please call her at [phone]

## 2018-02-23 NOTE — Telephone Encounter (Signed)
I received a fax to do PA for Linzess 72 mcg. However, pt was increased to 145 mcg at last visit. I have left VM for a return call to discuss.

## 2018-02-26 MED ORDER — LINACLOTIDE 145 MCG PO CAPS: 145.0000 ug | ORAL_CAPSULE | Freq: Every day | ORAL | 5 refills | Status: DC

## 2018-02-26 NOTE — Telephone Encounter (Signed)
Rx sent to pharmacy. I anticipate we'll get a PA fax, [place]. Let me know if anything else is needed from me.

## 2018-02-26 NOTE — Addendum Note (Signed)
Addended by: Gordy Levan, Annastasia Haskins A on: 02/26/2018 12:35 PM   Modules accepted: Orders

## 2018-03-02 ENCOUNTER — Telehealth: Payer: Self-pay

## 2018-03-02 NOTE — Telephone Encounter (Signed)
I have started the PA for the Linzess 145 mcg.

## 2018-03-08 NOTE — Telephone Encounter (Signed)
Note sent to provider 

## 2018-03-15 ENCOUNTER — Ambulatory Visit (INDEPENDENT_AMBULATORY_CARE_PROVIDER_SITE_OTHER): Payer: BLUE CROSS/BLUE SHIELD | Admitting: Otolaryngology

## 2018-03-15 DIAGNOSIS — J343 Hypertrophy of nasal turbinates: Secondary | ICD-10-CM

## 2018-03-15 DIAGNOSIS — J310 Chronic rhinitis: Secondary | ICD-10-CM | POA: Diagnosis not present

## 2018-03-15 NOTE — Telephone Encounter (Signed)
Received paperwork from [employer] to complete in reference to the PA request for Linzess 145 mcg.  Pt said the Linzess 145 mcg works well. Sometimes she has to hold, but it works better than the 72 mcg.  Paperwork on [employer] to complete.

## 2018-03-16 ENCOUNTER — Ambulatory Visit (INDEPENDENT_AMBULATORY_CARE_PROVIDER_SITE_OTHER): Payer: BLUE CROSS/BLUE SHIELD | Admitting: Cardiovascular Disease

## 2018-03-16 ENCOUNTER — Encounter: Payer: Self-pay | Admitting: Cardiovascular Disease

## 2018-03-16 VITALS — BP 156/86 | HR 72 | Ht 67.0 in | Wt 248.0 lb

## 2018-03-16 DIAGNOSIS — R9439 Abnormal result of other cardiovascular function study: Secondary | ICD-10-CM | POA: Diagnosis not present

## 2018-03-16 DIAGNOSIS — I119 Hypertensive heart disease without heart failure: Secondary | ICD-10-CM | POA: Diagnosis not present

## 2018-03-16 DIAGNOSIS — E785 Hyperlipidemia, unspecified: Secondary | ICD-10-CM

## 2018-03-16 DIAGNOSIS — I10 Essential (primary) hypertension: Secondary | ICD-10-CM | POA: Diagnosis not present

## 2018-03-16 DIAGNOSIS — I480 Paroxysmal atrial fibrillation: Secondary | ICD-10-CM | POA: Diagnosis not present

## 2018-03-16 NOTE — Patient Instructions (Signed)
Medication Instructions:  Your physician recommends that you continue on your current medications as directed. Please refer to the Current Medication list given to you today.  If you need a refill on your cardiac medications before your next appointment, please call your pharmacy.   Lab work: None today If you have labs (blood work) drawn today and your tests are completely normal, you will receive your results only by: . MyChart Message (if you have MyChart) OR . A paper copy in the mail If you have any lab test that is abnormal or we need to change your treatment, we will call you to review the results.  Testing/Procedures: None today  Follow-Up: At [facility], you and your health needs are our priority.  As part of our continuing mission to provide you with exceptional heart care, we have created designated Provider Care Teams.  These Care Teams include your primary Cardiologist (physician) and Advanced Practice Providers (APPs -  Physician Assistants and Nurse Practitioners) who all work together to provide you with the care you need, when you need it. You will need a follow up appointment in 6 months.  Please call our office 2 months in advance to schedule this appointment.  You may see Suresh Koneswaran, MD or one of the following Advanced Practice Providers on your designated Care Team:   Brittany Strader, PA-C ([place] Office) . Michele Lenze, PA-C ([place] Office)  Any Other Special Instructions Will Be Listed Below (If Applicable). None   

## 2018-03-16 NOTE — Progress Notes (Signed)
SUBJECTIVE: The patient presents for routine follow-up of paroxysmal atrial fibrillation and hypertensive heart disease. She underwent a low risk nuclear stress test in May 2017 which showed a small mild apical infarct with mild peri-infarct ischemia.  Echocardiogram 11/14/16: Normal left ventricular systolic function with mild LVH, LVEF 37-90%, grade 2 diastolic dysfunction with elevated filling pressures, mild left atrial enlargement.  ECG performed in the office today which I ordered and personally interpreted demonstrates normal sinus rhythm with nonspecific T-wave abnormalities in precordial leads.  She seldom has palpitations.  She has chronic exertional dyspnea which is stable.  She has some chest discomfort related to acid reflux disease and triggered by carbonated beverages.  It is relieved with belching.    Review of Systems: As per "subjective", otherwise negative.  Allergies  Allergen Reactions  . Hydralazine Nausea Only  . Prednisone Swelling    Current Outpatient Medications  Medication Sig Dispense Refill  . acetaminophen (TYLENOL) 500 MG tablet Take 500 mg by mouth every 6 (six) hours as needed for moderate pain or headache.     [name] THYROID 90 MG tablet Take 90 mg by mouth daily.   4  . budesonide (RHINOCORT AQUA) 32 MCG/ACT nasal spray Place 1 spray into both nostrils daily as needed for rhinitis.    Marland Kitchen DEXILANT 60 MG capsule TAKE ONE CAPSULE BY MOUTH EVERY DAY 30 capsule 11  . diltiazem (CARDIZEM CD) 120 MG 24 hr capsule Take 1 capsule (120 mg total) daily by mouth. 90 capsule 3  . Glycerin-Polysorbate 80 (REFRESH DRY EYE THERAPY OP) Apply 1 drop to eye daily as needed (dry eyes).    Marland Kitchen linaclotide (LINZESS) 145 MCG CAPS capsule Take 1 capsule (145 mcg total) by mouth daily before breakfast. 30 capsule 5  . losartan (COZAAR) 100 MG tablet Take 100 mg by mouth daily.    . methocarbamol (ROBAXIN) 500 MG tablet Take 1 tablet (500 mg total) by mouth 3 (three)  times daily. 21 tablet 0  . montelukast (SINGULAIR) 10 MG tablet Take 1 tablet by mouth daily.    . rosuvastatin (CRESTOR) 5 MG tablet Take 1 tablet (5 mg total) by mouth daily. 90 tablet 3  . spironolactone (ALDACTONE) 25 MG tablet TAKE ONE (1) TABLET BY MOUTH EVERY DAY 90 tablet 3  . traMADol (ULTRAM) 50 MG tablet 1 or 2 po q6h prn pain 15 tablet 0  . XARELTO 20 MG TABS tablet TAKE 1 TABLET BY MOUTH ONCE DAILY WITH SUPPER 90 tablet 3   No current facility-administered medications for this visit.     Past Medical History:  Diagnosis Date  . Back pain   . Body aches 11/21/2014  . BV (bacterial vaginosis) 05/23/2013  . Constipation 11/21/2014  . Elevated cholesterol 11/02/2013  . Fibroids 03/13/2016  . Hematuria 05/23/2013  . Hyperlipidemia   . Hypertension   . Migraines   . PAF (paroxysmal atrial fibrillation) ([place])    a. diagnosed in 11/2016 --> started on Xarelto for anticoagulation  . Pelvic pain in female 11/02/2013  . Plantar fasciitis of right foot   . Thyroid disease   . Vaginal discharge 03/24/2014  . Vaginal irritation 05/23/2013  . Vaginal itching 03/24/2014    Past Surgical History:  Procedure Laterality Date  . ECTOPIC PREGNANCY SURGERY    . ESOPHAGOGASTRODUODENOSCOPY  12/19/2009   WIO:XBDZHG stricture s/p dilation/mild gastritis  . ESOPHAGOGASTRODUODENOSCOPY N/A 12/10/2015   Procedure: ESOPHAGOGASTRODUODENOSCOPY (EGD);  Surgeon: Danie Binder, MD;  Location: [facility];  Service: Endoscopy;  Laterality: N/A;  830  . ileocolonoscopy  12/19/2009   ALP:[id number] polyps/mild left-side diverticulosis/hemorrhoids  . KNEE SURGERY    . TUBAL LIGATION      Social History   Socioeconomic History  . Marital status: Married    Spouse name: Not on file  . Number of children: Not on file  . Years of education: Not on file  . Highest education level: Not on file  Occupational History  . Not on file  Social Needs  . Financial resource strain: Not on file  . Food  insecurity:    Worry: Not on file    Inability: Not on file  . Transportation needs:    Medical: Not on file    Non-medical: Not on file  Tobacco Use  . Smoking status: Former Smoker    Types: Cigarettes    Start date: 01/07/1975    Last attempt to quit: 2005    Years since quitting: 15.1  . Smokeless tobacco: Never Used  Substance and Sexual Activity  . Alcohol use: No    Alcohol/week: 0.0 standard drinks  . Drug use: No  . Sexual activity: Yes    Birth control/protection: Post-menopausal, Surgical    Comment: tubal  Lifestyle  . Physical activity:    Days per week: Not on file    Minutes per session: Not on file  . Stress: Not on file  Relationships  . Social connections:    Talks on phone: Not on file    Gets together: Not on file    Attends religious service: Not on file    Active member of club or organization: Not on file    Attends meetings of clubs or organizations: Not on file    Relationship status: Not on file  . Intimate partner violence:    Fear of current or ex partner: Not on file    Emotionally abused: Not on file    Physically abused: Not on file    Forced sexual activity: Not on file  Other Topics Concern  . Not on file  Social History Narrative  . Not on file     Vitals:   03/16/18 1330  BP: (!) 156/86  Pulse: 72  SpO2: 95%  Weight: 248 lb (112.5 kg)  Height: 5\' 7"  (1.702 m)    Wt Readings from Last 3 Encounters:  03/16/18 248 lb (112.5 kg)  02/16/18 248 lb 6.4 oz (112.7 kg)  01/22/18 248 lb (112.5 kg)     PHYSICAL EXAM General: NAD HEENT: Normal. Neck: No JVD, no thyromegaly. Lungs: Clear to auscultation bilaterally with normal respiratory effort. CV: Regular rate and rhythm, normal S1/S2, no S3/S4, no murmur. No pretibial or periankle edema.  No carotid bruit.   Abdomen: Soft, nontender, no distention.  Neurologic: Alert and oriented.  Psych: Normal affect. Skin: Normal. Musculoskeletal: No gross deformities.    ECG:  Reviewed above under Subjective   Labs: Lab Results  Component Value Date/Time   K 3.9 01/22/2018 02:42 PM   BUN 13 01/22/2018 02:42 PM   BUN 11 01/01/2017 11:39 AM   CREATININE 0.74 01/22/2018 02:42 PM   CREATININE 0.77 08/01/2015 12:29 PM   ALT 21 01/22/2018 02:42 PM   TSH 1.680 01/01/2017 11:39 AM   HGB 12.4 01/22/2018 02:42 PM   HGB 12.6 01/01/2017 11:39 AM     Lipids: Lab Results  Component Value Date/Time   LDLCALC 154 (H) 01/01/2017 11:39 AM   CHOL 231 (H)  01/01/2017 11:39 AM   TRIG 100 01/01/2017 11:39 AM   HDL 57 01/01/2017 11:39 AM       ASSESSMENT AND PLAN: 1. Paroxysmal atrial fibrillation: Symptomatically stable on long-acting diltiazem. Anticoagulated with Xarelto. Mild left atrial enlargement. No changes to therapy.    2. Hypertensive heart disease: Most recent echocardiogram from November 2018 reviewed above. She is euvolemic.Blood pressure is elevated but she did not take her medications yesterday and took today's medications about an hour ago.  No changes to therapy.  3. Abnormal nuclear stress test: Stress test results detailed above from 2017. Symptomatically stable. No changes to therapy.  4. Hypertension: Blood pressure is elevated but she did not take her medications yesterday and took today's medications about an hour ago.  No changes to therapy.  5. Hyperlipidemia: I will obtain a copy of lipids from PCP.  Continue rosuvastatin 5 mg for now.    Disposition: Follow up 6 months   Kate Sable, M.D., F.A.C.C.

## 2018-03-18 NOTE — Telephone Encounter (Signed)
LMOM for a return call.  

## 2018-03-18 NOTE — Telephone Encounter (Signed)
Has the patient ever tried Trulance? If not (from the way the paperwork sounds) they will likely require it first.  If she hasn't tried it, can we have her pick up a 1-week sample pack?

## 2018-03-19 NOTE — Telephone Encounter (Signed)
Pt is aware and will stop by and pick up samples. Trulance 3 mg #7 at front desk.

## 2018-03-21 ENCOUNTER — Other Ambulatory Visit: Payer: Self-pay | Admitting: Student

## 2018-03-21 ENCOUNTER — Other Ambulatory Visit: Payer: Self-pay | Admitting: Cardiovascular Disease

## 2018-04-01 ENCOUNTER — Telehealth: Payer: Self-pay | Admitting: Gastroenterology

## 2018-04-01 DIAGNOSIS — K5900 Constipation, unspecified: Secondary | ICD-10-CM

## 2018-04-01 MED ORDER — PLECANATIDE 3 MG PO TABS: 1.0000 | ORAL_TABLET | Freq: Every day | ORAL | 3 refills | Status: DC

## 2018-04-01 NOTE — Telephone Encounter (Signed)
Forwarding to Walden Field, NP to send in Rx for the Trulance.

## 2018-04-01 NOTE — Telephone Encounter (Signed)
[phone] patient called and said the samples we let her try worked ok, not as good as linzess, but they worked.Marland Kitchenwould like a prescription sent to her pharmacy.[facility] in [place]

## 2018-04-01 NOTE — Addendum Note (Signed)
Addended by: Gordy Levan, Mckinlee Dunk A on: 04/01/2018 04:25 PM   Modules accepted: Orders

## 2018-04-01 NOTE — Telephone Encounter (Signed)
Rx sent to pharmacy. If she needs a little more to help, she can try adding either colace stool softener daily (or as needed) versus MiraLAX daily (or as needed) in addition to Trulance to see if we can optimize things. If adding either of these is "too much" she can back off and just stick with Trulance.

## 2018-04-02 NOTE — Telephone Encounter (Signed)
PT is aware.

## 2018-04-06 ENCOUNTER — Telehealth: Payer: Self-pay | Admitting: Gastroenterology

## 2018-04-06 NOTE — Telephone Encounter (Signed)
PT said Trulance will need PA also. She is aware I will start working on that and she can use Miralax and the Colace in the meantime since we do not have samples.

## 2018-04-06 NOTE — Telephone Encounter (Signed)
Pt has question about her medication (Trulance). Please call [phone]

## 2018-04-20 DIAGNOSIS — Z91013 Allergy to seafood: Secondary | ICD-10-CM | POA: Diagnosis not present

## 2018-04-20 DIAGNOSIS — E6609 Other obesity due to excess calories: Secondary | ICD-10-CM | POA: Diagnosis not present

## 2018-04-20 DIAGNOSIS — R070 Pain in throat: Secondary | ICD-10-CM | POA: Diagnosis not present

## 2018-04-20 DIAGNOSIS — Z6836 Body mass index (BMI) 36.0-36.9, adult: Secondary | ICD-10-CM | POA: Diagnosis not present

## 2018-05-20 DIAGNOSIS — M47814 Spondylosis without myelopathy or radiculopathy, thoracic region: Secondary | ICD-10-CM | POA: Diagnosis not present

## 2018-05-20 DIAGNOSIS — M545 Low back pain: Secondary | ICD-10-CM | POA: Diagnosis not present

## 2018-05-20 DIAGNOSIS — M47817 Spondylosis without myelopathy or radiculopathy, lumbosacral region: Secondary | ICD-10-CM | POA: Diagnosis not present

## 2018-05-20 DIAGNOSIS — M546 Pain in thoracic spine: Secondary | ICD-10-CM | POA: Diagnosis not present

## 2018-06-01 ENCOUNTER — Telehealth: Payer: Self-pay

## 2018-06-01 NOTE — Telephone Encounter (Signed)
I have started the PA for Trulance.  

## 2018-06-08 DIAGNOSIS — Z6836 Body mass index (BMI) 36.0-36.9, adult: Secondary | ICD-10-CM | POA: Diagnosis not present

## 2018-06-08 DIAGNOSIS — I4891 Unspecified atrial fibrillation: Secondary | ICD-10-CM | POA: Diagnosis not present

## 2018-06-08 DIAGNOSIS — E039 Hypothyroidism, unspecified: Secondary | ICD-10-CM | POA: Diagnosis not present

## 2018-06-08 DIAGNOSIS — Z1389 Encounter for screening for other disorder: Secondary | ICD-10-CM | POA: Diagnosis not present

## 2018-06-08 DIAGNOSIS — R7303 Prediabetes: Secondary | ICD-10-CM | POA: Diagnosis not present

## 2018-06-08 DIAGNOSIS — I10 Essential (primary) hypertension: Secondary | ICD-10-CM | POA: Diagnosis not present

## 2018-06-08 DIAGNOSIS — R7309 Other abnormal glucose: Secondary | ICD-10-CM | POA: Diagnosis not present

## 2018-06-08 NOTE — Telephone Encounter (Signed)
Noted  

## 2018-06-08 NOTE — Telephone Encounter (Signed)
Pt called and was informed I have started a PA for the Trulance. I am leaving her samples to pick up of Trulance 3 mg to take once a day with or without food.

## 2018-06-09 ENCOUNTER — Telehealth: Payer: Self-pay | Admitting: Gastroenterology

## 2018-06-09 NOTE — Telephone Encounter (Signed)
PATIENT BROUGHT IN FMLA PAPERS TO BE FILLED OUT AND THEY ARE ON YOUR DESK

## 2018-06-11 NOTE — Telephone Encounter (Signed)
PA approved for the Trulance.

## 2018-06-14 ENCOUNTER — Telehealth: Payer: Self-pay | Admitting: Adult Health

## 2018-06-14 NOTE — Telephone Encounter (Signed)
Patient thinks her PH balance is off, would like a refill on that medication.  [facility]  [phone] - needs a call before 2 if possible, she has to leave to go to work

## 2018-06-15 MED ORDER — FLUCONAZOLE 150 MG PO TABS: ORAL_TABLET | ORAL | 1 refills | Status: DC

## 2018-06-15 NOTE — Telephone Encounter (Signed)
[facility]

## 2018-06-15 NOTE — Addendum Note (Signed)
Addended by: Derrek Monaco A on: 06/15/2018 08:45 AM   Modules accepted: Orders

## 2018-06-16 MED ORDER — METRONIDAZOLE 500 MG PO TABS: 500.0000 mg | ORAL_TABLET | Freq: Two times a day (BID) | ORAL | 0 refills | Status: DC

## 2018-06-16 NOTE — Addendum Note (Signed)
Addended by: Derrek Monaco A on: 06/16/2018 12:23 PM   Modules accepted: Orders

## 2018-06-16 NOTE — Telephone Encounter (Signed)
Pt states she does not need the diflucan but is needing something to help with her Ph balance.

## 2018-06-16 NOTE — Telephone Encounter (Signed)
Left message I [facility] Rx [facility]yl [facility], if not better let me know

## 2018-06-23 ENCOUNTER — Other Ambulatory Visit: Payer: Self-pay

## 2018-06-23 MED ORDER — SPIRONOLACTONE 25 MG PO TABS: ORAL_TABLET | ORAL | 3 refills | Status: DC

## 2018-06-23 NOTE — Telephone Encounter (Signed)
Refill sent.

## 2018-07-26 ENCOUNTER — Other Ambulatory Visit: Payer: Self-pay | Admitting: Adult Health

## 2018-07-26 DIAGNOSIS — M25512 Pain in left shoulder: Secondary | ICD-10-CM | POA: Diagnosis not present

## 2018-07-30 ENCOUNTER — Encounter (HOSPITAL_COMMUNITY): Payer: Self-pay | Admitting: *Deleted

## 2018-07-30 ENCOUNTER — Emergency Department (HOSPITAL_COMMUNITY): Payer: BC Managed Care – PPO

## 2018-07-30 ENCOUNTER — Other Ambulatory Visit: Payer: Self-pay

## 2018-07-30 ENCOUNTER — Emergency Department (HOSPITAL_COMMUNITY)
Admission: EM | Admit: 2018-07-30 | Discharge: 2018-07-30 | Disposition: A | Discharge status: Home / Self Care | Payer: BC Managed Care – PPO | Attending: Emergency Medicine | Admitting: Emergency Medicine

## 2018-07-30 DIAGNOSIS — Z7901 Long term (current) use of anticoagulants: Secondary | ICD-10-CM | POA: Diagnosis not present

## 2018-07-30 DIAGNOSIS — M545 Low back pain, unspecified: Secondary | ICD-10-CM

## 2018-07-30 DIAGNOSIS — R300 Dysuria: Secondary | ICD-10-CM | POA: Insufficient documentation

## 2018-07-30 DIAGNOSIS — E119 Type 2 diabetes mellitus without complications: Secondary | ICD-10-CM | POA: Insufficient documentation

## 2018-07-30 DIAGNOSIS — Z79899 Other long term (current) drug therapy: Secondary | ICD-10-CM | POA: Insufficient documentation

## 2018-07-30 DIAGNOSIS — I2510 Atherosclerotic heart disease of native coronary artery without angina pectoris: Secondary | ICD-10-CM | POA: Insufficient documentation

## 2018-07-30 DIAGNOSIS — I10 Essential (primary) hypertension: Secondary | ICD-10-CM | POA: Diagnosis not present

## 2018-07-30 DIAGNOSIS — E039 Hypothyroidism, unspecified: Secondary | ICD-10-CM | POA: Insufficient documentation

## 2018-07-30 DIAGNOSIS — Z87891 Personal history of nicotine dependence: Secondary | ICD-10-CM | POA: Insufficient documentation

## 2018-07-30 DIAGNOSIS — J45909 Unspecified asthma, uncomplicated: Secondary | ICD-10-CM | POA: Diagnosis not present

## 2018-07-30 LAB — URINALYSIS, ROUTINE W REFLEX MICROSCOPIC
Bilirubin Urine: NEGATIVE
Glucose, UA: NEGATIVE mg/dL
Ketones, ur: NEGATIVE mg/dL
Leukocytes,Ua: NEGATIVE
Nitrite: NEGATIVE
Protein, ur: NEGATIVE mg/dL
Specific Gravity, Urine: 1.015 (ref 1.005–1.030)
pH: 5 (ref 5.0–8.0)

## 2018-07-30 MED ORDER — METHYLPREDNISOLONE 4 MG PO TBPK: ORAL_TABLET | ORAL | 0 refills | Status: DC

## 2018-07-30 MED ORDER — METHOCARBAMOL 500 MG PO TABS: 500.0000 mg | ORAL_TABLET | Freq: Three times a day (TID) | ORAL | 0 refills | Status: DC

## 2018-07-30 MED ORDER — CEPHALEXIN 500 MG PO CAPS: 500.0000 mg | ORAL_CAPSULE | Freq: Four times a day (QID) | ORAL | 0 refills | Status: DC

## 2018-07-30 NOTE — ED Provider Notes (Signed)
[facility] EMERGENCY DEPARTMENT Provider Note   CSN: [id number] Arrival date & time: 07/30/18  3545     History   Chief Complaint Chief Complaint  Patient presents with  . Back Pain    HPI Becky [name] is a 58 y.o. female.     HPI   Becky [name] is a 58 y.o. female who presents to the Emergency Department complaining of pain across her lower back and into her hips.  Symptoms have been present for 2 weeks.  She reports history of chronic back pain, but current pain has been worse than usual.  She states the pain is worse upon waking and improves throughout the day.  She also endorses having some increased urinary frequency and suprapubic discomfort.  She denies flank pain, fever, chills, nausea or vomiting, pain numbness or weakness of lower extremities.  No known injury.    Past Medical History:  Diagnosis Date  . Back pain   . Body aches 11/21/2014  . BV (bacterial vaginosis) 05/23/2013  . Constipation 11/21/2014  . Elevated cholesterol 11/02/2013  . Fibroids 03/13/2016  . Hematuria 05/23/2013  . Hyperlipidemia   . Hypertension   . Migraines   . PAF (paroxysmal atrial fibrillation) ([place])    a. diagnosed in 11/2016 --> started on Xarelto for anticoagulation  . Pelvic pain in female 11/02/2013  . Plantar fasciitis of right foot   . Thyroid disease   . Vaginal discharge 03/24/2014  . Vaginal irritation 05/23/2013  . Vaginal itching 03/24/2014    Patient Active Problem List   Diagnosis Date Noted  . Vaginal odor 09/24/2017  . Abdominal pain 08/18/2017  . Nausea without vomiting 11/17/2016  . Current use of long term anticoagulation 11/12/2016  . Atrial fibrillation with RVR ([place]) 11/07/2016  . Coronary artery disease due to lipid rich plaque   . RLQ abdominal pain 10/03/2016  . Yeast infection 03/13/2016  . Fibroids 03/13/2016  . Screening for colorectal cancer 11/28/2015  . Burning with urination 11/28/2015  . Subacute frontal sinusitis 11/28/2015  .  Leg cramps 10/31/2015  . Chest pain 10/31/2015  . Varicose 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 62/56/3893  . Body aches 11/21/2014  . Constipation 11/21/2014  . Vaginal itching 03/24/2014  . Vaginal discharge 03/24/2014  . Pelvic pain 11/02/2013  . Elevated cholesterol 11/02/2013  . Low back pain 10/20/2013  . Stiffness of ankle joint 10/20/2013  . Pain in joint, ankle and foot 10/20/2013  . Vaginal irritation 05/23/2013  . Hematuria 05/23/2013  . BV (bacterial vaginosis) 05/23/2013  . HEMATOCHEZIA 12/05/2009  . Dysphagia 12/05/2009  . CHEST WALL PAIN, ANTERIOR 06/23/2007  . LEG PAIN 05/18/2007  . VAGINAL PRURITUS 04/16/2007  . GOITER 03/10/2007  . HYPOTHYROIDISM 03/10/2007  . DIABETES MELLITUS, TYPE II 03/10/2007  . HYPERLIPIDEMIA 03/10/2007  . ANEMIA-IRON DEFICIENCY 03/10/2007  . Essential hypertension 03/10/2007  . RHINITIS, CHRONIC 03/10/2007  . ASTHMA, CHILDHOOD 03/10/2007  . GERD 03/10/2007  . PEPTIC ULCER DISEASE 03/10/2007  . MENOPAUSAL SYNDROME 03/10/2007    Past Surgical History:  Procedure Laterality Date  . ECTOPIC PREGNANCY SURGERY    . ESOPHAGOGASTRODUODENOSCOPY  12/19/2009   TDS:KAJGOT stricture s/p dilation/mild gastritis  . ESOPHAGOGASTRODUODENOSCOPY N/A 12/10/2015   Procedure: ESOPHAGOGASTRODUODENOSCOPY (EGD);  Surgeon: Danie Binder, MD;  Location: [facility];  Service: Endoscopy;  Laterality: N/A;  830  . ileocolonoscopy  12/19/2009   LXB:[id number] polyps/mild left-side diverticulosis/hemorrhoids  . KNEE SURGERY    . TUBAL LIGATION  OB History    Gravida  6   Para  3   Term      Preterm      AB  3   Living  3     SAB      TAB      Ectopic  3   Multiple      Live Births  3            Home Medications    Prior to Admission medications   Medication Sig Start Date End Date Taking? Authorizing Provider  acetaminophen (TYLENOL) 500 MG tablet Take 500 mg by mouth every 6 (six) hours as needed for  moderate pain or headache.     [provider]  ARMOUR THYROID 90 MG tablet Take 90 mg by mouth daily.  09/04/15   [provider]  budesonide (RHINOCORT AQUA) 32 MCG/ACT nasal spray Place 1 spray into both nostrils daily as needed for rhinitis.    [provider]  CARTIA XT 120 MG 24 hr capsule Take 1 capsule by mouth once daily 03/22/18   Herminio Commons, MD  DEXILANT 30 MG capsule TAKE ONE CAPSULE BY MOUTH ONCE DAILY 07/26/18   Derrek Monaco A, NP  fluconazole (DIFLUCAN) 150 MG tablet Take 1 now and repeat 1 in 3 days 06/15/18   Estill Dooms, NP  Glycerin-Polysorbate 80 (REFRESH DRY EYE THERAPY OP) Apply 1 drop to eye daily as needed (dry eyes).    [provider]  linaclotide Rolan Lipa) 145 MCG CAPS capsule Take 1 capsule (145 mcg total) by mouth daily before breakfast. 02/26/18   Carlis Stable, NP  losartan (COZAAR) 100 MG tablet Take 100 mg by mouth daily. 09/21/14   [provider]  methocarbamol (ROBAXIN) 500 MG tablet Take 1 tablet (500 mg total) by mouth 3 (three) times daily. 01/22/18   Lily Kocher, PA-C  metroNIDAZOLE (FLAGYL) 500 MG tablet Take 1 tablet (500 mg total) by mouth 2 (two) times daily. 06/16/18   Estill Dooms, NP  montelukast (SINGULAIR) 10 MG tablet Take 1 tablet by mouth daily. 08/25/16   [provider]  Plecanatide (TRULANCE) 3 MG TABS Take 1 tablet by mouth daily. Take with or without food 04/01/18   Carlis Stable, NP  rosuvastatin (CRESTOR) 5 MG tablet Take 1 tablet by mouth once daily 03/22/18   Herminio Commons, MD  spironolactone (ALDACTONE) 25 MG tablet TAKE ONE (1) TABLET BY MOUTH EVERY DAY 06/23/18   Herminio Commons, MD  traMADol Veatrice Bourbon) 50 MG tablet 1 or 2 po q6h prn pain 01/22/18   Lily Kocher, PA-C  XARELTO 20 MG TABS tablet TAKE 1 TABLET BY MOUTH ONCE DAILY WITH SUPPER 12/16/17   Ahmed Prima, Fransisco Hertz, PA-C    Family History Family History  Problem Relation Age of Onset  . Heart  failure Mother   . Hypertension Mother   . Diabetes Mother   . Heart failure Father   . Hypertension Father   . Heart failure Brother   . Hypertension Sister   . Other Sister        blocked artery in neck; knee replacement  . Other Brother        triple bypass surgery  . Hypertension Sister   . Diabetes Sister   . Colon cancer Neg Hx     Social History Social History   Tobacco Use  . Smoking status: Former Smoker    Types: Cigarettes    Start  date: 01/07/1975    Quit date: 2005    Years since quitting: 15.5  . Smokeless tobacco: Never Used  Substance Use Topics  . Alcohol use: No    Alcohol/week: 0.0 standard drinks  . Drug use: No     Allergies   Hydralazine and Prednisone   Review of Systems Review of Systems  Constitutional: Negative for fever.  Respiratory: Negative for shortness of breath.   Cardiovascular: Negative for chest pain.  Gastrointestinal: Negative for abdominal pain, constipation, nausea and vomiting.  Genitourinary: Positive for frequency. Negative for decreased urine volume, difficulty urinating, dysuria, flank pain, hematuria and pelvic pain.  Musculoskeletal: Positive for back pain. Negative for joint swelling and neck pain.  Skin: Negative for rash.  Neurological: Negative for weakness and numbness.     Physical Exam Updated Vital Signs BP 124/78 (BP Location: Left Arm)   Pulse 76   Temp 98.4 F (36.9 C) (Oral)   Resp 15   Ht 5\' 7"  (1.702 m)   Wt 112 kg   SpO2 97%   BMI 38.69 kg/m   Physical Exam Vitals signs and nursing note reviewed.  Constitutional:      General: She is not in acute distress.    Appearance: Normal appearance. She is well-developed. She is not toxic-appearing.  HENT:     Head: Atraumatic.  Neck:     Musculoskeletal: Normal range of motion and neck supple.  Cardiovascular:     Rate and Rhythm: Normal rate and regular rhythm.     Pulses: Normal pulses.     Comments: DP pulses are strong and palpable  bilaterally Pulmonary:     Effort: Pulmonary effort is normal. No respiratory distress.     Breath sounds: Normal breath sounds.  Abdominal:     General: There is no distension.     Palpations: Abdomen is soft.     Tenderness: There is no abdominal tenderness. There is no right CVA tenderness or left CVA tenderness.  Musculoskeletal:        General: Tenderness present.     Lumbar back: She exhibits tenderness and pain. She exhibits normal range of motion, no swelling, no deformity, no laceration and normal pulse.     Comments: ttp of the lower lumbar spine and bilateral paraspinal muscles.  Neg SLR bilaterally.  Pt has 5/5 strength against resistance of bilateral lower extremities.     Skin:    General: Skin is warm.     Capillary Refill: Capillary refill takes less than 2 seconds.     Findings: No rash.  Neurological:     Mental Status: She is alert and oriented to person, place, and time.     Sensory: No sensory deficit.     Motor: No weakness or abnormal muscle tone.     Gait: Gait normal.     Deep Tendon Reflexes:     Reflex Scores:      Patellar reflexes are 2+ on the right side and 2+ on the left side.      Achilles reflexes are 2+ on the right side and 2+ on the left side.     ED Treatments / Results  Labs (all labs ordered are listed, but only abnormal results are displayed) Labs Reviewed  URINALYSIS, ROUTINE W REFLEX MICROSCOPIC - Abnormal; Notable for the following components:      Result Value   Hgb urine dipstick LARGE (*)    Bacteria, UA RARE (*)    All other components within normal limits  URINE CULTURE    EKG None  Radiology Dg Lumbar Spine Complete  Result Date: 07/30/2018 CLINICAL DATA:  Low back pain for 2 weeks EXAM: LUMBAR SPINE - COMPLETE 4+ VIEW COMPARISON:  09/11/2017 FINDINGS: Five lumbar type vertebral segments. Vertebral body heights and alignment are maintained. Intervertebral disc spaces are preserved. Minimal lower lumbar facet arthrosis.  Scattered mild abdominal aortic atherosclerotic calcification. IMPRESSION: Minimal lower lumbar facet arthrosis.  No acute findings. Electronically Signed   By: Davina Poke M.D.   On: 07/30/2018 11:32    Procedures Procedures (including critical care time)  Medications Ordered in ED Medications - No data to display   Initial Impression / Assessment and Plan / ED Course  I have reviewed the triage vital signs and the nursing notes.  Pertinent labs & imaging results that were available during my care of the patient were reviewed by me and considered in my medical decision making (see chart for details).        Patient with likely acute on chronic low back pain.  She has history of same.  Pain has been worsening for 2 weeks.  No known injury.  She is ambulatory with a steady gait.  No focal neuro deficits on exam.  No concerning symptoms for emergent neurological process.  X-ray shows mild arthrosis.  U/A shows hematuria w/o nitrites or significant WBC's, but pt is reporting sx's so I will start abx at least until culture results.  Low clinical suspicion for pyelonephritis. Doubt kidney stone.  Pt agrees to tx plan and close PCP f/u.  Return precautions discussed.    Pt has prednisone listed as allergy,and only reports facial swelling with extended use, states that she can take methylprednisolone.    Final Clinical Impressions(s) / ED Diagnoses   Final diagnoses:  Acute midline low back pain without sciatica  Dysuria    ED Discharge Orders    None       Bufford Lope 07/31/18 2153    Milton Ferguson, MD 08/04/18 Pauline Aus

## 2018-07-30 NOTE — ED Triage Notes (Signed)
Patient presents to the ED with intermittent lower back pain for 2 weeks without known injury.

## 2018-07-30 NOTE — Discharge Instructions (Addendum)
You may alternate ice and heat to your lower back.  Avoid twisting or bending movements.  Start the antibiotic and take as directed.  Your urine culture results should be back next week, you can review your results on MyChart if your culture results are negative you may stop the antibiotic.  Follow-up with your primary doctor for recheck, return to the ER for any worsening symptoms.

## 2018-07-31 LAB — URINE CULTURE: Culture: NO GROWTH

## 2018-08-02 ENCOUNTER — Telehealth: Payer: Self-pay | Admitting: Gastroenterology

## 2018-08-02 NOTE — Telephone Encounter (Signed)
PLEASE CALL PATIENT ABOUT Becky [name],  [phone]. HER INSURANCE IS NO LONGER PAYING AND WANTS TO KNOW IF SOMETHING ELSE CAN BE CALLED IN OR IF SHE CAN HAVE SOME SAMPLES

## 2018-08-02 NOTE — Telephone Encounter (Signed)
2 boxes of Dexilant 60 mg at front for pick up.

## 2018-08-02 NOTE — Telephone Encounter (Signed)
LMOM for a return call to discuss.  

## 2018-08-06 NOTE — Telephone Encounter (Signed)
PT is aware to stop by and pick up samples.

## 2018-08-09 NOTE — Telephone Encounter (Signed)
PT said she use to get it at [facility] and [facility] in [place] some. She had insurance through [employer]. Now she has [employer]. She was previously able to go on line and get a manufacturer's coupon. She is going to try and she will let me know if we need to do anything else.

## 2018-08-09 NOTE — Telephone Encounter (Signed)
I spoke to [place] at [facility] in [place] and she said pt has not gotten Dexilant there for a long time. She was looking back to 2018.   She tried to run the Rx and it just says that her insurance does not cover.  Does not ask for a PA.  I have left Vm for pt to return call to discuss.

## 2018-08-18 ENCOUNTER — Ambulatory Visit (INDEPENDENT_AMBULATORY_CARE_PROVIDER_SITE_OTHER): Payer: BC Managed Care – PPO | Admitting: Nurse Practitioner

## 2018-08-18 ENCOUNTER — Other Ambulatory Visit: Payer: Self-pay

## 2018-08-18 ENCOUNTER — Telehealth: Payer: Self-pay

## 2018-08-18 ENCOUNTER — Encounter: Payer: Self-pay | Admitting: Nurse Practitioner

## 2018-08-18 VITALS — BP 140/76 | HR 66 | Temp 98.1°F | Ht 67.0 in | Wt 247.6 lb

## 2018-08-18 DIAGNOSIS — K5900 Constipation, unspecified: Secondary | ICD-10-CM

## 2018-08-18 DIAGNOSIS — K219 Gastro-esophageal reflux disease without esophagitis: Secondary | ICD-10-CM | POA: Diagnosis not present

## 2018-08-18 NOTE — Assessment & Plan Note (Signed)
Chronic constipation that previously responded best to Linzess 145 mcg.  However, insurance would not cover this.  We trialed Trulance after prior off and approval and this seems to be working well enough for her at this time.  Recommend she continue Trulance and follow-up in 6 months.  Call us if any worsening or recurrent symptoms.

## 2018-08-18 NOTE — Progress Notes (Signed)
Referring Provider: Redmond School, MD Primary Care Physician:  Redmond School, MD Primary GI:  Dr. Oneida Alar  Chief Complaint  Patient presents with  . Gastroesophageal Reflux    constipation    HPI:   Becky [name] is a 58 y.o. female who presents for follow-up on GERD and constipation.  The patient was last seen in our office 02/16/2018 for the same as well as right lower quadrant abdominal pain.  Colonoscopy and EGD up-to-date on file.  Previous success with Linzess 72 mcg and Dexilant as well as Zantac.  CT of the abdomen and pelvis that was ordered by primary care was completed 08/24/2017 which found no acute abnormalities, mild diverticulosis left-sided colon, mild hepatic steatosis, small benign left adrenal adenoma.  CT renal stone study completed 01/22/2018 found no urolithiasis but stable lobulated uterine contour suggesting small uterine fibroids.  At her last visit she was still having some breakthrough constipation despite MiraLAX and Colace in addition to Linzess 72 mcg.  She was amendable to increase her her Linzess dose.  Some breakthrough GERD, typically if she misses Dexilant dose.  Still with some right lower quadrant abdominal pain undergoing work-up by primary care with possible constipation contributing to her pain.  Intermittent nausea that she feels is related to her sinuses and has nausea medication that works.  Some rectal itching which was helped with Preparation H and is not an ongoing problem.  No other GI complaints.  Recommended increase Linzess to 145 mcg daily, continue Dexilant, follow-up in 6 months.  The patient called our office to follow-up and felt Linzess 145 mcg was working and so prescription was sent to her pharmacy.  There were struggles with insurance to get Linzess 145 mcg covered and finally received a PA approval for Trulance, which the patient stated helped, but not as well. Recommended adding Colace or MiraLAX to Trulance if needed.  One final  call on 08/02/2018 indicated that was a change in insurance her [place] may not be covered any further.  Today she states she's doing ok overall. She is frustrated that insurance isn't paying for Dexilant. When I entered the room she was on the phone with her insurance company to find out why they won't cover it. States her GERD does well if she takes Dexilant, but if she doesn't she has significant GERD symptoms. She has tried and failed MULTIPLE other medications (as outlined below). Her constipation is is better on Trulance, will have breakthrough if she doesn't take it. Abdominal pain has improved on Trulance, improves after a bowel movement. Denies any other abdominal pain, ongoing N/V, hematochezia, melena, fever, chills, unintentional weight loss. Denies URI or flu-like symptoms. Denies loss of sense of taste or smell. Denies chest pain, dyspnea, dizziness, lightheadedness, syncope, near syncope. Denies any other upper or lower GI symptoms.  Has previously tried and failed omeprazole, pantoprazole, esomeprazole, famotadine, ranitadine.  Past Medical History:  Diagnosis Date  . Back pain   . Body aches 11/21/2014  . BV (bacterial vaginosis) 05/23/2013  . Constipation 11/21/2014  . Elevated cholesterol 11/02/2013  . Fibroids 03/13/2016  . Hematuria 05/23/2013  . Hyperlipidemia   . Hypertension   . Migraines   . PAF (paroxysmal atrial fibrillation) ([place])    a. diagnosed in 11/2016 --> started on Xarelto for anticoagulation  . Pelvic pain in female 11/02/2013  . Plantar fasciitis of right foot   . Thyroid disease   . Vaginal discharge 03/24/2014  . Vaginal irritation 05/23/2013  . Vaginal  itching 03/24/2014    Past Surgical History:  Procedure Laterality Date  . ECTOPIC PREGNANCY SURGERY    . ESOPHAGOGASTRODUODENOSCOPY  12/19/2009   UXN:ATFTDD stricture s/p dilation/mild gastritis  . ESOPHAGOGASTRODUODENOSCOPY N/A 12/10/2015   Procedure: ESOPHAGOGASTRODUODENOSCOPY (EGD);  Surgeon: Danie Binder, MD;  Location: [facility];  Service: Endoscopy;  Laterality: N/A;  830  . ileocolonoscopy  12/19/2009   UKG:[id number] polyps/mild left-side diverticulosis/hemorrhoids  . KNEE SURGERY    . TUBAL LIGATION      Current Outpatient Medications  Medication Sig Dispense Refill  . acetaminophen (TYLENOL) 500 MG tablet Take 500 mg by mouth every 6 (six) hours as needed for moderate pain or headache.     [name] THYROID 90 MG tablet Take 90 mg by mouth daily.   4  . budesonide (RHINOCORT AQUA) 32 MCG/ACT nasal spray Place 1 spray into both nostrils daily as needed for rhinitis.    Marland Kitchen CARTIA XT 120 MG 24 hr capsule Take 1 capsule by mouth once daily (Patient taking differently: 240 mg. ) 90 capsule 0  . cetirizine (ZYRTEC) 10 MG tablet Take 1 tablet by mouth daily.    Marland Kitchen DEXILANT 30 MG capsule TAKE ONE CAPSULE BY MOUTH ONCE DAILY 30 capsule 0  . EPINEPHrine 0.3 mg/0.3 mL IJ SOAJ injection Inject 0.3 mg into the muscle once as needed.    . Glycerin-Polysorbate 80 (REFRESH DRY EYE THERAPY OP) Apply 1 drop to eye daily as needed (dry eyes).    Marland Kitchen ketorolac (TORADOL) 10 MG tablet Take 10 mg by mouth 4 (four) times daily.    . montelukast (SINGULAIR) 10 MG tablet Take 1 tablet by mouth daily.    Marland Kitchen olmesartan (BENICAR) 40 MG tablet Take 1 tablet by mouth daily.    Marland Kitchen Plecanatide (TRULANCE) 3 MG TABS Take 1 tablet by mouth daily. Take with or without food 30 tablet 3  . rosuvastatin (CRESTOR) 5 MG tablet Take 1 tablet by mouth once daily 90 tablet 0  . spironolactone (ALDACTONE) 25 MG tablet TAKE ONE (1) TABLET BY MOUTH EVERY DAY 90 tablet 3  . XARELTO 20 MG TABS tablet TAKE 1 TABLET BY MOUTH ONCE DAILY WITH SUPPER 90 tablet 3   No current facility-administered medications for this visit.     Allergies as of 08/18/2018 - Review Complete 08/18/2018  Allergen Reaction Noted  . Hydralazine Nausea Only 01/30/2016  . Prednisone Swelling 01/30/2015    Family History  Problem Relation Age of  Onset  . Heart failure Mother   . Hypertension Mother   . Diabetes Mother   . Heart failure Father   . Hypertension Father   . Heart failure Brother   . Hypertension Sister   . Other Sister        blocked artery in neck; knee replacement  . Other Brother        triple bypass surgery  . Hypertension Sister   . Diabetes Sister   . Colon cancer Neg Hx     Social History   Socioeconomic History  . Marital status: Married    Spouse name: Not on file  . Number of children: Not on file  . Years of education: Not on file  . Highest education level: Not on file  Occupational History  . Not on file  Social Needs  . Financial resource strain: Not on file  . Food insecurity    Worry: Not on file    Inability: Not on file  . Transportation needs  Medical: Not on file    Non-medical: Not on file  Tobacco Use  . Smoking status: Former Smoker    Types: Cigarettes    Start date: 01/07/1975    Quit date: 2005    Years since quitting: 15.6  . Smokeless tobacco: Never Used  Substance and Sexual Activity  . Alcohol use: No    Alcohol/week: 0.0 standard drinks  . Drug use: No  . Sexual activity: Yes    Birth control/protection: Post-menopausal, Surgical    Comment: tubal  Lifestyle  . Physical activity    Days per week: Not on file    Minutes per session: Not on file  . Stress: Not on file  Relationships  . Social Herbalist on phone: Not on file    Gets together: Not on file    Attends religious service: Not on file    Active member of club or organization: Not on file    Attends meetings of clubs or organizations: Not on file    Relationship status: Not on file  Other Topics Concern  . Not on file  Social History Narrative  . Not on file    Review of Systems: Complete ROS negative except as per HPI.   Physical Exam: BP 140/76   Pulse 66   Temp 98.1 F (36.7 C)   Ht 5\' 7"  (1.702 m)   Wt 247 lb 9.6 oz (112.3 kg)   BMI 38.78 kg/m  General:   Alert  and oriented. Pleasant and cooperative. Well-nourished and well-developed.  Eyes:  Without icterus, sclera clear and conjunctiva pink.  Ears:  Normal auditory acuity. Cardiovascular:  S1, S2 present without murmurs appreciated. Extremities without clubbing or edema. Respiratory:  Clear to auscultation bilaterally. No wheezes, rales, or rhonchi. No distress.  Gastrointestinal:  +BS, soft, non-tender and non-distended. No HSM noted. No guarding or rebound. No masses appreciated.  Rectal:  Deferred  Musculoskalatal:  Symmetrical without gross deformities. Neurologic:  Alert and oriented x4;  grossly normal neurologically. Psych:  Alert and cooperative. Normal mood and affect. Heme/Lymph/Immune: No excessive bruising noted.    08/18/2018 2:03 PM   Disclaimer: This note was dictated with voice recognition software. Similar sounding words can inadvertently be transcribed and may not be corrected upon review.

## 2018-08-18 NOTE — Patient Instructions (Addendum)
Your health issues we discussed today were:   GERD (reflux/heartburn): 1. Continue taking Dexilant 2. We will complete the medication exception request for the insurance company and send it back to them this afternoon 3. Hopefully this will allow them to improve and pay for Dexilant to help keep your reflux symptoms under control 4. Call us if he has any worsening or severe symptoms  Constipation: 1. I am glad Trulance is working for you 2. Continue to take Trulance daily with or without food 3. Call us if you have any worsening or severe symptoms  Overall I recommend:  1. Continue your other current medications 2. Return for follow-up in 6 months 3. Call us if you have any questions or concerns.   Because of recent events of COVID-19 ("Coronavirus"), follow CDC recommendations:  1. Wash your hand frequently 2. Avoid touching your face 3. Stay away from people who are sick 4. If you have symptoms such as fever, cough, shortness of breath then call your healthcare provider for further guidance 5. If you are sick, STAY AT HOME unless otherwise directed by your healthcare provider. 6. Follow directions from state and national officials regarding staying safe   At [facility] we value your feedback. You may receive a survey about your visit today. Please share your experience as we strive to create trusting relationships with our patients to provide genuine, compassionate, quality care.  We appreciate your understanding and patience as we review any laboratory studies, imaging, and other diagnostic tests that are ordered as we care for you. Our office policy is 5 business days for review of these results, and any emergent or urgent results are addressed in a timely manner for your best interest. If you do not hear from our office in 1 week, please contact us.   We also encourage the use of MyChart, which contains your medical information for your review as well. If you  are not enrolled in this feature, an access code is on this after visit summary for your convenience. Thank you for allowing [place] to be involved in your care.  It was great to see you today!  I hope you have a great summer!!

## 2018-08-18 NOTE — Telephone Encounter (Signed)
I have completed and  faxed the Coverage Exception back to [facility].

## 2018-08-18 NOTE — Assessment & Plan Note (Signed)
As per HPI, the patient is tried and failed multiple antacids and PPIs.  Insurance is faxing [place] an exception request to complete in order to get Dexilant paid for.  Dexilant is the only medication that has been able to manage her GERD symptoms.  I will check and see if we have samples today.  Otherwise, we will send the coverage exception back this afternoon.  Continue her other current medications.  Follow-up in 6 months.  Call if any worsening or severe symptoms.

## 2018-08-24 NOTE — Progress Notes (Signed)
CC'D TO PCP °

## 2018-08-26 ENCOUNTER — Telehealth: Payer: Self-pay

## 2018-08-26 NOTE — Telephone Encounter (Signed)
I called [employer] and the Exception was denied for Dexilant also.  Denial Reason: The requested drug must be covered under your pharmacy benefit. At this time, the requested drug is not covered under your pharmacy benefit, but generic equivalents or alternatives to this drug may be covered.  Pt is aware that she is to call the number on the back of her insurance card to see what options she has.  She will call me when she gets that information.

## 2018-08-26 NOTE — Patient Instructions (Addendum)
YOU NEED TO HAVE A COVID 19 TEST ON_8-24-20______ @_______ , THIS TEST MUST BE DONE BEFORE SURGERY, COME  [address] [place] , [id number]. ONCE YOUR COVID TEST IS COMPLETED, PLEASE BEGIN THE QUARANTINE INSTRUCTIONS AS OUTLINED IN YOUR HANDOUT.                Becky [name]  08/26/2018   Your procedure is scheduled on: 09-02-18  Report to [facility] Main  Entrance              Report to admitting at        100  PM   1 VISITOR IS ALLOWED TO WAIT IN WAITING ROOM  ONLY DAY OF YOUR SURGERY. NO VISITORS ARE ALLOWED IN SHORT STAY OR RECOVERY ROOM.   Call this number if you have problems the morning of surgery [phone]    Remember: Do not eat food After Midnight.NO SOLID FOOD AFTER MIDNIGHT THE NIGHT PRIOR TO SURGERY. NOTHING BY   MOUTH EXCEPT CLEAR LIQUIDS UNTIL        1200 pm. PLEASE FINISH ENSURE DRINK PER SURGEON ORDER  WHICH NEEDS TO BE   COMPLETED AT     .1200 pm       then nothing by mouth     CLEAR LIQUID DIET   Foods Allowed                                                                     Foods Excluded  Coffee and tea, regular and decaf                             liquids that you cannot  Plain Jell-O any favor except red or purple                                           see through such as: Fruit ices (not with fruit pulp)                                     milk, soups, orange juice  Iced Popsicles                                    All solid food Carbonated beverages, regular and diet                                    Cranberry, grape and apple juices Sports drinks like Gatorade Lightly seasoned clear broth or consume(fat free) Sugar, honey syrup  Sample Menu Breakfast                                Lunch  Supper Cranberry juice                    Beef broth                            Chicken broth Jell-O                                     Grape juice                           Apple juice Coffee or  tea                        Jell-O                                      Popsicle                                                Coffee or tea                        Coffee or tea  _____________________________________________________________________      BRUSH YOUR TEETH MORNING OF SURGERY AND RINSE YOUR MOUTH OUT, NO CHEWING GUM CANDY OR MINTS.     Take these medicines the morning of surgery with A SIP OF WATER: linzess, eye drops if needed, dexilant, armour thyroid                                You may not have any metal on your body including hair pins and              piercings  Do not wear jewelry, make-up, lotions, powders or perfumes, deodorant             Do not wear nail polish.  Do not shave  48 hours prior to surgery.              Do not bring valuables to the hospital. [place].  Contacts, dentures or bridgework may not be worn into surgery.      Patients discharged the day of surgery will not be allowed to drive home. IF YOU ARE HAVING SURGERY AND GOING HOME THE SAME DAY, YOU MUST HAVE AN ADULT TO DRIVE YOU HOME AND BE WITH YOU FOR 24 HOURS. YOU MAY GO HOME BY TAXI OR UBER OR ORTHERWISE, BUT AN ADULT MUST ACCOMPANY YOU HOME AND STAY WITH YOU FOR 24 HOURS.  Name and phone number of your driver:  Special Instructions: N/A              Please read over the following fact sheets you were given: _____________________________________________________________________             [facility] - Preparing for Surgery Before surgery, you can play an important role.  Because skin is not sterile, your skin needs to be  as free of germs as possible.  You can reduce the number of germs on your skin by washing with CHG (chlorahexidine gluconate) soap before surgery.  CHG is an antiseptic cleaner which kills germs and bonds with the skin to continue killing germs even after washing. Please DO NOT use if you have an allergy to CHG or  antibacterial soaps.  If your skin becomes reddened/irritated stop using the CHG and inform your nurse when you arrive at Short Stay. Do not shave (including legs and underarms) for at least 48 hours prior to the first CHG shower.  You may shave your face/neck. Please follow these instructions carefully:  1.  Shower with CHG Soap the night before surgery and the  morning of Surgery.  2.  If you choose to wash your hair, wash your hair first as usual with your  normal  shampoo.  3.  After you shampoo, rinse your hair and body thoroughly to remove the  shampoo.                           4.  Use CHG as you would any other liquid soap.  You can apply chg directly  to the skin and wash                       Gently with a scrungie or clean washcloth.  5.  Apply the CHG Soap to your body ONLY FROM THE NECK DOWN.   Do not use on face/ open                           Wound or open sores. Avoid contact with eyes, ears mouth and genitals (private parts).                       Wash face,  Genitals (private parts) with your normal soap.             6.  Wash thoroughly, paying special attention to the area where your surgery  will be performed.  7.  Thoroughly rinse your body with warm water from the neck down.  8.  DO NOT shower/wash with your normal soap after using and rinsing off  the CHG Soap.                9.  Pat yourself dry with a clean towel.            10.  Wear clean pajamas.            11.  Place clean sheets on your bed the night of your first shower and do not  sleep with pets. Day of Surgery : Do not apply any lotions/deodorants the morning of surgery.  Please wear clean clothes to the hospital/surgery center.  FAILURE TO FOLLOW THESE INSTRUCTIONS MAY RESULT IN THE CANCELLATION OF YOUR SURGERY PATIENT SIGNATURE_________________________________  NURSE SIGNATURE__________________________________  ________________________________________________________________________   Becky [name]  An incentive spirometer is a tool that can help keep your lungs clear and active. This tool measures how well you are filling your lungs with each breath. Taking long deep breaths may help reverse or decrease the chance of developing breathing (pulmonary) problems (especially infection) following:  A long period of time when you are unable to move or be active. BEFORE THE PROCEDURE   If the spirometer includes an indicator to show your best  effort, your nurse or respiratory therapist will set it to a desired goal.  If possible, sit up straight or lean slightly forward. Try not to slouch.  Hold the incentive spirometer in an upright position. INSTRUCTIONS FOR USE  1. Sit on the edge of your bed if possible, or sit up as far as you can in bed or on a chair. 2. Hold the incentive spirometer in an upright position. 3. Breathe out normally. 4. Place the mouthpiece in your mouth and seal your lips tightly around it. 5. Breathe in slowly and as deeply as possible, raising the piston or the ball toward the top of the column. 6. Hold your breath for 3-5 seconds or for as long as possible. Allow the piston or ball to fall to the bottom of the column. 7. Remove the mouthpiece from your mouth and breathe out normally. 8. Rest for a few seconds and repeat Steps 1 through 7 at least 10 times every 1-2 hours when you are awake. Take your time and take a few normal breaths between deep breaths. 9. The spirometer may include an indicator to show your best effort. Use the indicator as a goal to work toward during each repetition. 10. After each set of 10 deep breaths, practice coughing to be sure your lungs are clear. If you have an incision (the cut made at the time of surgery), support your incision when coughing by placing a pillow or rolled up towels firmly against it. Once you are able to get out of bed, walk around indoors and cough well. You may stop using the incentive spirometer when  instructed by your caregiver.  RISKS AND COMPLICATIONS  Take your time so you do not get dizzy or light-headed.  If you are in pain, you may need to take or ask for pain medication before doing incentive spirometry. It is harder to take a deep breath if you are having pain. AFTER USE  Rest and breathe slowly and easily.  It can be helpful to keep track of a log of your progress. Your caregiver can provide you with a simple table to help with this. If you are using the spirometer at home, follow these instructions: [place] IF:   You are having difficultly using the spirometer.  You have trouble using the spirometer as often as instructed.  Your pain medication is not giving enough relief while using the spirometer.  You develop fever of 100.5 F (38.1 C) or higher. SEEK IMMEDIATE MEDICAL CARE IF:   You cough up bloody sputum that had not been present before.  You develop fever of 102 F (38.9 C) or greater.  You develop worsening pain at or near the incision site. MAKE SURE YOU:   Understand these instructions.  Will watch your condition.  Will get help right away if you are not doing well or get worse. Document Released: 05/05/2006 Document Revised: 03/17/2011 Document Reviewed: 07/06/2006 [facility] Patient Information 2014 [place], [place].   ________________________________________________________________________

## 2018-08-27 ENCOUNTER — Other Ambulatory Visit: Payer: Self-pay

## 2018-08-27 ENCOUNTER — Encounter (HOSPITAL_COMMUNITY): Payer: Self-pay

## 2018-08-27 ENCOUNTER — Encounter (HOSPITAL_COMMUNITY)
Admission: RE | Admit: 2018-08-27 | Discharge: 2018-08-27 | Disposition: A | Discharge status: Home / Self Care | Payer: BC Managed Care – PPO | Source: Ambulatory Visit | Attending: Orthopedic Surgery | Admitting: Orthopedic Surgery

## 2018-08-27 DIAGNOSIS — Z01812 Encounter for preprocedural laboratory examination: Secondary | ICD-10-CM | POA: Insufficient documentation

## 2018-08-27 DIAGNOSIS — G4730 Sleep apnea, unspecified: Secondary | ICD-10-CM | POA: Insufficient documentation

## 2018-08-27 DIAGNOSIS — M25812 Other specified joint disorders, left shoulder: Secondary | ICD-10-CM | POA: Diagnosis not present

## 2018-08-27 DIAGNOSIS — E785 Hyperlipidemia, unspecified: Secondary | ICD-10-CM | POA: Diagnosis not present

## 2018-08-27 DIAGNOSIS — M1990 Unspecified osteoarthritis, unspecified site: Secondary | ICD-10-CM | POA: Insufficient documentation

## 2018-08-27 DIAGNOSIS — Z87891 Personal history of nicotine dependence: Secondary | ICD-10-CM | POA: Insufficient documentation

## 2018-08-27 DIAGNOSIS — I10 Essential (primary) hypertension: Secondary | ICD-10-CM | POA: Insufficient documentation

## 2018-08-27 DIAGNOSIS — I480 Paroxysmal atrial fibrillation: Secondary | ICD-10-CM | POA: Insufficient documentation

## 2018-08-27 DIAGNOSIS — Z7901 Long term (current) use of anticoagulants: Secondary | ICD-10-CM | POA: Diagnosis not present

## 2018-08-27 DIAGNOSIS — Z79899 Other long term (current) drug therapy: Secondary | ICD-10-CM | POA: Insufficient documentation

## 2018-08-27 DIAGNOSIS — Z20822 Contact with and (suspected) exposure to covid-19: Secondary | ICD-10-CM

## 2018-08-27 HISTORY — DX: Sleep apnea, unspecified: G47.30

## 2018-08-27 HISTORY — DX: Unspecified osteoarthritis, unspecified site: M19.90

## 2018-08-27 HISTORY — DX: Hypothyroidism, unspecified: E03.9

## 2018-08-27 HISTORY — DX: Cardiac arrhythmia, unspecified: I49.9

## 2018-08-27 NOTE — Progress Notes (Signed)
LVM with [place] Chard concerning pt. Not having instructions on Xarelto and also pt. Daughter testing positive for covid and pt and daughter live in the same house.

## 2018-08-27 NOTE — Progress Notes (Signed)
03-16-18 ekg epic 11-14-16 echo epic

## 2018-08-27 NOTE — Progress Notes (Signed)
covid

## 2018-08-27 NOTE — Telephone Encounter (Signed)
Noted  

## 2018-08-28 LAB — NOVEL CORONAVIRUS, NAA: SARS-CoV-2, NAA: NOT DETECTED

## 2018-08-30 ENCOUNTER — Other Ambulatory Visit (HOSPITAL_COMMUNITY)
Admission: RE | Admit: 2018-08-30 | Discharge: 2018-08-30 | Disposition: A | Discharge status: Home / Self Care | Payer: BC Managed Care – PPO | Source: Ambulatory Visit | Attending: Orthopedic Surgery | Admitting: Orthopedic Surgery

## 2018-08-30 ENCOUNTER — Other Ambulatory Visit: Payer: Self-pay

## 2018-08-30 DIAGNOSIS — Z20828 Contact with and (suspected) exposure to other viral communicable diseases: Secondary | ICD-10-CM | POA: Insufficient documentation

## 2018-08-30 DIAGNOSIS — Z01812 Encounter for preprocedural laboratory examination: Secondary | ICD-10-CM | POA: Insufficient documentation

## 2018-08-31 LAB — SARS CORONAVIRUS 2 (TAT 6-24 HRS): SARS Coronavirus 2: NEGATIVE
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
## 2018-09-01 ENCOUNTER — Telehealth: Payer: Self-pay | Admitting: Gastroenterology

## 2018-09-01 NOTE — Telephone Encounter (Signed)
Pt said The Dexilant would still cost her $180.00.  She has enquired about patient assistance and said she should qualify for that.  Bernita Buffy should be faxing me info and I told her if they do not, I will print off a form and mail to her to complete and return.

## 2018-09-01 NOTE — Telephone Encounter (Signed)
Pt called asking for DS regarding her prescription of Dexilant. Please call her back at [phone]

## 2018-09-01 NOTE — Telephone Encounter (Signed)
Received the form and pt is aware.  She will come by and pick it up. Also, we have some Dexilant now and I am leaving her #10 samples at front. She will return the patient assistance papers in a few days.

## 2018-09-02 ENCOUNTER — Ambulatory Visit (HOSPITAL_COMMUNITY): Payer: BC Managed Care – PPO | Admitting: Certified Registered Nurse Anesthetist

## 2018-09-02 ENCOUNTER — Other Ambulatory Visit: Payer: Self-pay

## 2018-09-02 ENCOUNTER — Ambulatory Visit (HOSPITAL_COMMUNITY): Payer: BC Managed Care – PPO | Admitting: Physician Assistant

## 2018-09-02 ENCOUNTER — Ambulatory Visit (HOSPITAL_COMMUNITY)
Admission: RE | Admit: 2018-09-02 | Discharge: 2018-09-02 | Disposition: A | Discharge status: Home / Self Care | Payer: BC Managed Care – PPO | Attending: Orthopedic Surgery | Admitting: Orthopedic Surgery

## 2018-09-02 ENCOUNTER — Encounter (HOSPITAL_COMMUNITY)
Admission: RE | Disposition: A | Discharge status: Home / Self Care | Payer: Self-pay | Source: Home / Self Care | Attending: Orthopedic Surgery

## 2018-09-02 DIAGNOSIS — Z79890 Hormone replacement therapy (postmenopausal): Secondary | ICD-10-CM | POA: Diagnosis not present

## 2018-09-02 DIAGNOSIS — E039 Hypothyroidism, unspecified: Secondary | ICD-10-CM | POA: Insufficient documentation

## 2018-09-02 DIAGNOSIS — Z7901 Long term (current) use of anticoagulants: Secondary | ICD-10-CM | POA: Insufficient documentation

## 2018-09-02 DIAGNOSIS — M19012 Primary osteoarthritis, left shoulder: Secondary | ICD-10-CM | POA: Insufficient documentation

## 2018-09-02 DIAGNOSIS — Z87891 Personal history of nicotine dependence: Secondary | ICD-10-CM | POA: Diagnosis not present

## 2018-09-02 DIAGNOSIS — I739 Peripheral vascular disease, unspecified: Secondary | ICD-10-CM | POA: Diagnosis not present

## 2018-09-02 DIAGNOSIS — J45909 Unspecified asthma, uncomplicated: Secondary | ICD-10-CM | POA: Insufficient documentation

## 2018-09-02 DIAGNOSIS — K219 Gastro-esophageal reflux disease without esophagitis: Secondary | ICD-10-CM | POA: Diagnosis not present

## 2018-09-02 DIAGNOSIS — M94212 Chondromalacia, left shoulder: Secondary | ICD-10-CM | POA: Diagnosis not present

## 2018-09-02 DIAGNOSIS — M25512 Pain in left shoulder: Secondary | ICD-10-CM | POA: Diagnosis not present

## 2018-09-02 DIAGNOSIS — G4730 Sleep apnea, unspecified: Secondary | ICD-10-CM | POA: Insufficient documentation

## 2018-09-02 DIAGNOSIS — Z79899 Other long term (current) drug therapy: Secondary | ICD-10-CM | POA: Insufficient documentation

## 2018-09-02 DIAGNOSIS — M75112 Incomplete rotator cuff tear or rupture of left shoulder, not specified as traumatic: Secondary | ICD-10-CM | POA: Diagnosis not present

## 2018-09-02 DIAGNOSIS — I480 Paroxysmal atrial fibrillation: Secondary | ICD-10-CM | POA: Insufficient documentation

## 2018-09-02 DIAGNOSIS — I10 Essential (primary) hypertension: Secondary | ICD-10-CM | POA: Insufficient documentation

## 2018-09-02 DIAGNOSIS — E785 Hyperlipidemia, unspecified: Secondary | ICD-10-CM | POA: Diagnosis not present

## 2018-09-02 DIAGNOSIS — M65812 Other synovitis and tenosynovitis, left shoulder: Secondary | ICD-10-CM | POA: Diagnosis not present

## 2018-09-02 DIAGNOSIS — M7542 Impingement syndrome of left shoulder: Secondary | ICD-10-CM

## 2018-09-02 DIAGNOSIS — E7800 Pure hypercholesterolemia, unspecified: Secondary | ICD-10-CM | POA: Diagnosis not present

## 2018-09-02 DIAGNOSIS — M24812 Other specific joint derangements of left shoulder, not elsewhere classified: Secondary | ICD-10-CM | POA: Diagnosis not present

## 2018-09-02 DIAGNOSIS — I890 Lymphedema, not elsewhere classified: Secondary | ICD-10-CM | POA: Diagnosis not present

## 2018-09-02 DIAGNOSIS — M25812 Other specified joint disorders, left shoulder: Secondary | ICD-10-CM | POA: Diagnosis not present

## 2018-09-02 DIAGNOSIS — Z4889 Encounter for other specified surgical aftercare: Secondary | ICD-10-CM | POA: Diagnosis not present

## 2018-09-02 DIAGNOSIS — S43432A Superior glenoid labrum lesion of left shoulder, initial encounter: Secondary | ICD-10-CM | POA: Diagnosis not present

## 2018-09-02 DIAGNOSIS — G8918 Other acute postprocedural pain: Secondary | ICD-10-CM | POA: Diagnosis not present

## 2018-09-02 HISTORY — PX: SHOULDER SURGERY: SHX246

## 2018-09-02 LAB — BASIC METABOLIC PANEL
Anion gap: 13 (ref 5–15)
BUN: 10 mg/dL (ref 6–20)
CO2: 22 mmol/L (ref 22–32)
Calcium: 9.7 mg/dL (ref 8.9–10.3)
Chloride: 105 mmol/L (ref 98–111)
Creatinine, Ser: 0.8 mg/dL (ref 0.44–1.00)
GFR calc Af Amer: >60 mL/min (ref >60)
GFR calc non Af Amer: >60 mL/min (ref >60)
Glucose, Bld: 97 mg/dL (ref 70–99)
Potassium: 4 mmol/L (ref 3.5–5.1)
Sodium: 140 mmol/L (ref 135–145)

## 2018-09-02 LAB — CBC
HCT: 42.3 % (ref 36.0–46.0)
Hemoglobin: 13.6 g/dL (ref 12.0–15.0)
MCH: 30.6 pg (ref 26.0–34.0)
MCHC: 32.2 g/dL (ref 30.0–36.0)
MCV: 95.3 fL (ref 80.0–100.0)
Platelets: 350 10*3/uL (ref 150–400)
RBC: 4.44 MIL/uL (ref 3.87–5.11)
RDW: 12.4 % (ref 11.5–15.5)
WBC: 8.9 10*3/uL (ref 4.0–10.5)
nRBC: 0 % (ref 0.0–0.2)

## 2018-09-02 SURGERY — SHOULDER ARTHROSCOPY WITH SUBACROMIAL DECOMPRESSION AND DISTAL CLAVICLE EXCISION
Anesthesia: Regional | Site: Shoulder | Laterality: Left

## 2018-09-02 MED ORDER — BUPIVACAINE HCL (PF) 0.5 % IJ SOLN
INTRAMUSCULAR | Status: DC | PRN
  Administered 2018-09-02: 15 mL via PERINEURAL

## 2018-09-02 MED ORDER — CYCLOBENZAPRINE HCL 10 MG PO TABS: 10.0000 mg | ORAL_TABLET | Freq: Three times a day (TID) | ORAL | 1 refills | Status: DC | PRN

## 2018-09-02 MED ORDER — FENTANYL CITRATE (PF) 100 MCG/2ML IJ SOLN
50.0000 ug | INTRAMUSCULAR | Status: DC
  Administered 2018-09-02: 100 ug via INTRAVENOUS
  Filled 2018-09-02: qty 2

## 2018-09-02 MED ORDER — CHLORHEXIDINE GLUCONATE 4 % EX LIQD: 60.0000 mL | Freq: Once | CUTANEOUS | Status: DC

## 2018-09-02 MED ORDER — ONDANSETRON HCL 4 MG/2ML IJ SOLN
INTRAMUSCULAR | Status: AC
  Filled 2018-09-02: qty 2

## 2018-09-02 MED ORDER — FENTANYL CITRATE (PF) 100 MCG/2ML IJ SOLN
INTRAMUSCULAR | Status: DC | PRN
  Administered 2018-09-02 (×2): 50 ug via INTRAVENOUS

## 2018-09-02 MED ORDER — PHENYLEPHRINE 40 MCG/ML (10ML) SYRINGE FOR IV PUSH (FOR BLOOD PRESSURE SUPPORT)
PREFILLED_SYRINGE | INTRAVENOUS | Status: DC | PRN
  Administered 2018-09-02 (×2): 120 ug via INTRAVENOUS

## 2018-09-02 MED ORDER — BUPIVACAINE LIPOSOME 1.3 % IJ SUSP
INTRAMUSCULAR | Status: DC | PRN
  Administered 2018-09-02: 10 mL via PERINEURAL

## 2018-09-02 MED ORDER — DEXAMETHASONE SODIUM PHOSPHATE 10 MG/ML IJ SOLN
INTRAMUSCULAR | Status: DC | PRN
  Administered 2018-09-02: 5 mg via INTRAVENOUS

## 2018-09-02 MED ORDER — PROPOFOL 10 MG/ML IV BOLUS
INTRAVENOUS | Status: AC
  Filled 2018-09-02: qty 40

## 2018-09-02 MED ORDER — CEFAZOLIN SODIUM-DEXTROSE 2-4 GM/100ML-% IV SOLN: 2.0000 g | INTRAVENOUS | Status: DC

## 2018-09-02 MED ORDER — HYDROMORPHONE HCL 1 MG/ML IJ SOLN: 0.2500 mg | INTRAMUSCULAR | Status: DC | PRN

## 2018-09-02 MED ORDER — SUGAMMADEX SODIUM 500 MG/5ML IV SOLN
INTRAVENOUS | Status: AC
  Filled 2018-09-02: qty 5

## 2018-09-02 MED ORDER — SODIUM CHLORIDE 0.9 % IV SOLN
INTRAVENOUS | Status: DC | PRN
  Administered 2018-09-02: 40 ug/min via INTRAVENOUS

## 2018-09-02 MED ORDER — LIDOCAINE 2% (20 MG/ML) 5 ML SYRINGE
INTRAMUSCULAR | Status: AC
  Filled 2018-09-02: qty 5

## 2018-09-02 MED ORDER — LACTATED RINGERS IV SOLN
INTRAVENOUS | Status: DC
  Administered 2018-09-02: 14:00:00 via INTRAVENOUS

## 2018-09-02 MED ORDER — PHENYLEPHRINE 40 MCG/ML (10ML) SYRINGE FOR IV PUSH (FOR BLOOD PRESSURE SUPPORT)
PREFILLED_SYRINGE | INTRAVENOUS | Status: AC
  Filled 2018-09-02: qty 20

## 2018-09-02 MED ORDER — METOCLOPRAMIDE HCL 5 MG/ML IJ SOLN
INTRAMUSCULAR | Status: AC
  Filled 2018-09-02: qty 2

## 2018-09-02 MED ORDER — SUGAMMADEX SODIUM 500 MG/5ML IV SOLN
INTRAVENOUS | Status: DC | PRN
  Administered 2018-09-02: 300 mg via INTRAVENOUS

## 2018-09-02 MED ORDER — MIDAZOLAM HCL 2 MG/2ML IJ SOLN
1.0000 mg | INTRAMUSCULAR | Status: DC
  Administered 2018-09-02: 2 mg via INTRAVENOUS
  Filled 2018-09-02: qty 2

## 2018-09-02 MED ORDER — EPHEDRINE 5 MG/ML INJ
INTRAVENOUS | Status: AC
  Filled 2018-09-02: qty 10

## 2018-09-02 MED ORDER — METOCLOPRAMIDE HCL 5 MG/ML IJ SOLN
10.0000 mg | Freq: Once | INTRAMUSCULAR | Status: AC
  Administered 2018-09-02: 10 mg via INTRAVENOUS

## 2018-09-02 MED ORDER — PROMETHAZINE HCL 25 MG/ML IJ SOLN: 6.2500 mg | INTRAMUSCULAR | Status: DC | PRN

## 2018-09-02 MED ORDER — PROPOFOL 10 MG/ML IV BOLUS
INTRAVENOUS | Status: DC | PRN
  Administered 2018-09-02: 200 mg via INTRAVENOUS

## 2018-09-02 MED ORDER — OXYCODONE HCL 5 MG/5ML PO SOLN: 5.0000 mg | Freq: Once | ORAL | Status: DC | PRN

## 2018-09-02 MED ORDER — 0.9 % SODIUM CHLORIDE (POUR BTL) OPTIME
TOPICAL | Status: DC | PRN
  Administered 2018-09-02: 16:00:00 1000 mL

## 2018-09-02 MED ORDER — CEFAZOLIN SODIUM-DEXTROSE 2-4 GM/100ML-% IV SOLN
2.0000 g | INTRAVENOUS | Status: AC
  Administered 2018-09-02: 2 g via INTRAVENOUS
  Filled 2018-09-02: qty 100

## 2018-09-02 MED ORDER — ACETAMINOPHEN 500 MG PO TABS
1000.0000 mg | ORAL_TABLET | Freq: Once | ORAL | Status: AC
  Administered 2018-09-02: 1000 mg via ORAL
  Filled 2018-09-02: qty 2

## 2018-09-02 MED ORDER — ROCURONIUM BROMIDE 10 MG/ML (PF) SYRINGE
PREFILLED_SYRINGE | INTRAVENOUS | Status: AC
  Filled 2018-09-02: qty 10

## 2018-09-02 MED ORDER — EPHEDRINE SULFATE-NACL 50-0.9 MG/10ML-% IV SOSY
PREFILLED_SYRINGE | INTRAVENOUS | Status: DC | PRN
  Administered 2018-09-02: 10 mg via INTRAVENOUS

## 2018-09-02 MED ORDER — SODIUM CHLORIDE 0.9 % IR SOLN
Status: DC | PRN
  Administered 2018-09-02: 3000 mL

## 2018-09-02 MED ORDER — ONDANSETRON HCL 4 MG/2ML IJ SOLN
INTRAMUSCULAR | Status: DC | PRN
  Administered 2018-09-02: 4 mg via INTRAVENOUS

## 2018-09-02 MED ORDER — OXYCODONE HCL 5 MG PO TABS: 5.0000 mg | ORAL_TABLET | Freq: Once | ORAL | Status: DC | PRN

## 2018-09-02 MED ORDER — DEXAMETHASONE SODIUM PHOSPHATE 10 MG/ML IJ SOLN
INTRAMUSCULAR | Status: AC
  Filled 2018-09-02: qty 1

## 2018-09-02 MED ORDER — KETOROLAC TROMETHAMINE 30 MG/ML IJ SOLN: 30.0000 mg | Freq: Once | INTRAMUSCULAR | Status: DC | PRN

## 2018-09-02 MED ORDER — ONDANSETRON HCL 4 MG PO TABS: 4.0000 mg | ORAL_TABLET | Freq: Three times a day (TID) | ORAL | 0 refills | Status: DC | PRN

## 2018-09-02 MED ORDER — OXYCODONE-ACETAMINOPHEN 5-325 MG PO TABS: 1.0000 | ORAL_TABLET | ORAL | 0 refills | Status: DC | PRN

## 2018-09-02 MED ORDER — ROCURONIUM BROMIDE 50 MG/5ML IV SOSY
PREFILLED_SYRINGE | INTRAVENOUS | Status: DC | PRN
  Administered 2018-09-02: 10 mg via INTRAVENOUS
  Administered 2018-09-02: 60 mg via INTRAVENOUS

## 2018-09-02 MED ORDER — FENTANYL CITRATE (PF) 100 MCG/2ML IJ SOLN
INTRAMUSCULAR | Status: AC
  Filled 2018-09-02: qty 2

## 2018-09-02 SURGICAL SUPPLY — 55 items
BLADE EXCALIBUR 4.0X13 (MISCELLANEOUS) ×2 IMPLANT
BOOTIES KNEE HIGH SLOAN (MISCELLANEOUS) ×4 IMPLANT
BURR OVAL 8 FLU 4.0X13 (MISCELLANEOUS) ×2 IMPLANT
CANNULA ACUFLEX KIT 5X76 (CANNULA) ×2 IMPLANT
CANNULA DRILOCK 5.0X75 (CANNULA) IMPLANT
CANNULA TWIST IN 8.25X7CM (CANNULA) IMPLANT
CONNECTOR 5 IN 1 STRAIGHT STRL (MISCELLANEOUS) ×2 IMPLANT
COOLER ICEMAN CLASSIC (MISCELLANEOUS) IMPLANT
COVER WAND RF STERILE (DRAPES) ×2 IMPLANT
DISSECTOR  3.8MM X 13CM (MISCELLANEOUS) ×1
DISSECTOR 3.8MM X 13CM (MISCELLANEOUS) ×1 IMPLANT
DRAPE INCISE 23X17 IOBAN STRL (DRAPES) ×1
DRAPE INCISE 23X17 STRL (DRAPES) ×1 IMPLANT
DRAPE INCISE IOBAN 23X17 STRL (DRAPES) ×1 IMPLANT
DRAPE INCISE IOBAN 66X45 STRL (DRAPES) ×2 IMPLANT
DRAPE ORTHO SPLIT 77X108 STRL (DRAPES) ×4
DRAPE STERI 35X30 U-POUCH (DRAPES) ×2 IMPLANT
DRAPE SURG 17X11 SM STRL (DRAPES) ×2 IMPLANT
DRAPE SURG ORHT 6 SPLT 77X108 (DRAPES) ×2 IMPLANT
DRAPE U-SHAPE 47X51 STRL (DRAPES) ×2 IMPLANT
DRSG PAD ABDOMINAL 8X10 ST (GAUZE/BANDAGES/DRESSINGS) ×3 IMPLANT
DURAPREP 26ML APPLICATOR (WOUND CARE) ×4 IMPLANT
GAUZE SPONGE 4X4 12PLY STRL (GAUZE/BANDAGES/DRESSINGS) ×2 IMPLANT
GLOVE BIO SURGEON STRL SZ7.5 (GLOVE) ×2 IMPLANT
GLOVE BIO SURGEON STRL SZ8 (GLOVE) ×2 IMPLANT
GLOVE SS BIOGEL STRL SZ 7 (GLOVE) ×2 IMPLANT
GLOVE SS BIOGEL STRL SZ 7.5 (GLOVE) ×1 IMPLANT
GLOVE SUPERSENSE BIOGEL SZ 7 (GLOVE) ×2
GLOVE SUPERSENSE BIOGEL SZ 7.5 (GLOVE) ×1
GOWN STRL REUS W/TWL LRG LVL3 (GOWN DISPOSABLE) ×4 IMPLANT
KIT BASIN OR (CUSTOM PROCEDURE TRAY) ×2 IMPLANT
KIT SHOULDER TRACTION (DRAPES) ×2 IMPLANT
KIT TURNOVER KIT A (KITS) IMPLANT
MANIFOLD NEPTUNE II (INSTRUMENTS) ×2 IMPLANT
NDL SCORPION MULTI FIRE (NEEDLE) IMPLANT
NEEDLE SCORPION MULTI FIRE (NEEDLE) IMPLANT
NS IRRIG 1000ML POUR BTL (IV SOLUTION) ×2 IMPLANT
PACK ARTHROSCOPY DSU (CUSTOM PROCEDURE TRAY) ×2 IMPLANT
PAD ARMBOARD 7.5X6 YLW CONV (MISCELLANEOUS) ×2 IMPLANT
PAD COLD SHLDR WRAP-ON (PAD) IMPLANT
PROBE APOLLO 90XL (SURGICAL WAND) ×2 IMPLANT
SLING ARM FOAM STRAP LRG (SOFTGOODS) ×1 IMPLANT
SLING ARM FOAM STRAP MED (SOFTGOODS) IMPLANT
SPONGE LAP 4X18 RFD (DISPOSABLE) ×2 IMPLANT
STRIP CLOSURE SKIN 1/2X4 (GAUZE/BANDAGES/DRESSINGS) ×2 IMPLANT
SUT FIBERWIRE #2 38 T-5 BLUE (SUTURE)
SUT MNCRL AB 3-0 PS2 18 (SUTURE) ×2 IMPLANT
SUT PDS AB 0 CT 36 (SUTURE) IMPLANT
SUT TIGER TAPE 7 IN WHITE (SUTURE) ×2 IMPLANT
SUTURE FIBERWR #2 38 T-5 BLUE (SUTURE) IMPLANT
TAPE FIBER 2MM 7IN #2 BLUE (SUTURE) IMPLANT
TAPE PAPER 3X10 WHT MICROPORE (GAUZE/BANDAGES/DRESSINGS) ×2 IMPLANT
TOWEL OR 17X26 10 PK STRL BLUE (TOWEL DISPOSABLE) ×2 IMPLANT
TUBING ARTHROSCOPY IRRIG 16FT (MISCELLANEOUS) ×2 IMPLANT
WATER STERILE IRR 1000ML POUR (IV SOLUTION) ×2 IMPLANT

## 2018-09-02 NOTE — Anesthesia Procedure Notes (Signed)
Anesthesia Regional Block: Interscalene brachial plexus block   Pre-Anesthetic Checklist: ,, timeout performed, Correct Patient, Correct Site, Correct Laterality, Correct Procedure, Correct Position, site marked, Risks and benefits discussed,  Surgical consent,  Pre-op evaluation,  At surgeon's request and post-op pain management  Laterality: Left  Prep: chloraprep       Needles:  Injection technique: Single-shot  Needle Type: Echogenic Stimulator Needle     Needle Length: 9cm  Needle Gauge: 21     Additional Needles:   Procedures:,,,, ultrasound used (permanent image in chart),,,,  Narrative:  Start time: 09/02/2018 2:40 PM End time: 09/02/2018 2:50 PM Injection made incrementally with aspirations every 5 mL.  Performed by: Personally  Anesthesiologist: Murvin Natal, MD  Additional Notes: Functioning IV was confirmed and monitors were applied.  A timeout was performed. Sterile prep, hand hygiene and sterile gloves were used. A 2mm 21ga Arrow echogenic stimulator needle was used. Negative aspiration and negative test dose prior to incremental administration of local anesthetic. The patient tolerated the procedure well.  Ultrasound guidance: relevent anatomy identified, needle position confirmed, local anesthetic spread visualized around nerve(s), vascular puncture avoided.  Image printed for medical record.

## 2018-09-02 NOTE — Op Note (Signed)
09/02/2018  4:53 PM  PATIENT:   Becky [name]  58 y.o. female  PRE-OPERATIVE DIAGNOSIS:  Left shoulder impingement, acromioclavicular osteoarthritis  POST-OPERATIVE DIAGNOSIS: Same with additional intraoperative finding of an extensive degenerative labral tear, partial rotator cuff tear, partial biceps tendon tear, and chondromalacia of the humeral head  PROCEDURE:  1.  Left shoulder examination under esthesia  2.  Left shoulder glenohumeral joint diagnostic arthroscopy  3.  Arthroscopic bicep tendon tenotomy  4.  Debridement of extensive degenerative labral tear  5.  Chondroplasty of the humeral head  6.  Debridement of partial articular rotator cuff tear  7.  Arthroscopic subacromial decompression bursectomy  8.  Arthroscopic distal clavicle resection  SURGEON:  Derry Kassel, Metta Clines. M.D.  ASSISTANTS: Becky Loges, PA-C  ANESTHESIA:   General endotracheal and interscalene block with Exparel  EBL: Minimal  SPECIMEN: None  Drains: None   PATIENT DISPOSITION:  PACU - hemodynamically stable.    PLAN OF CARE: Discharge to home after PACU  Brief history:  Becky [name] is a 58 year old female who has had chronic and progressively increasing left shoulder pain related to impingement syndrome with symptomatic AC joint arthropathy and severe rotator cuff tendinosis.  Her symptoms have been refractory to prolonged attempts at conservative management, and due to her increasing pain and functional mentation she is brought to the operating room at this time for planned left shoulder arthroscopy as described below  Preoperatively I counseled Becky [name] regarding treatment options as well as the potential risks versus benefits thereof.  Possible surgical complications were reviewed including bleeding, infection, neurovascular injury, persistent pain, loss of motion, anesthetic complication, and possible need for additional surgery.  She understands, and accepts, and agrees  with our planned procedure.  Procedure in detail:  After undergoing routine preop evaluation patient received prophylactic antibiotics and interscalene block with Exparel was established in the holding area by the anesthesia department.  Placed supine on the operative table and underwent the smooth induction of a general endotracheal anesthesia.  Placed into the right lateral decubitus position on the beanbag and appropriately padded and protected.  Left shoulder examination under anesthesia revealed full motion and no obvious instability patterns.  Left arm was then suspended at 70 degrees of abduction with 15 pounds of traction in the left shoulder girdle region was sterilely prepped and draped in standard fashion.  Timeout was called.  A posterior portal established in the glenohumeral joint anterior portal established under direct visualization.  Moderate synovitis was noted.  Areas of significant chondromalacia with 2 areas of full-thickness chondral loss done on the humeral head proximally 1 x 1 cm each and the surrounding rim of loose degenerative chondral tissue was debrided with shaver.  A anterior and superior degenerative labral tear was noted which was debrided with a shaver to stable margin.  The biceps tendon showed severe attenuation of partial tearing over its mid substance and tenotomy was performed.  Partial articular sided tearing of the distal supraspinatus was noted accounting for by my estimate approximate 10 to 15% of the thickness of the tendon.  This area was treated with a shaver to healthy tissue.  At this point final inspection and irrigation was completed within the glenohumeral joint.  Fluid and its was removed.  The arm was then dropped down to 30 degrees of abduction with the arthroscope introduced in the subacromial space to the posterior portal and a direct lateral portal was established and subacromial space.  Abundant dense bursal tissue and multiple adhesions were  encountered  and these were all divided and excised with combination the shaver and the Stryker wand.  The wand was then used to remove the periosteum from the undersurface the anterior half of the acromion and a subacromial decompression was performed with a bur creating a type I morphology.  A portal was then established directly anterior to the distal clavicle and the distal clavicle resection was performed with a bur.  Care was taken to confirm complete visualization of the entire circumference of the distal clavicle to ensure adequate removal of bone.  The subacromial/subdeltoid bursectomy was completed.  Bursal surface of the rotator cuff was found to be intact.  Final hemostasis was obtained.  Fluid analysis removed.  The portals were closed with a Monocryl and a Steri-Strip.  A dry dressing taped about the left shoulder left arm is placed in sling immobilizer and the patient was awakened, extubated, and taken to the recovery room in stable addition.  Becky Loges, PA-C was used as an Environmental consultant throughout this case essential for help with positioning patient, position extremity, tissue manipulation, wound closure, and intraoperative decision making.  Metta Clines [name] MD   Contact # [phone]

## 2018-09-02 NOTE — Progress Notes (Signed)
AssistedDr. Ellender with left, ultrasound guided, interscalene  block. Side rails up, monitors on throughout procedure. See vital signs in flow sheet. Tolerated Procedure well.  

## 2018-09-02 NOTE — Anesthesia Preprocedure Evaluation (Addendum)
Anesthesia Evaluation  Patient identified by MRN, date of birth, ID band Patient awake    Reviewed: Allergy & Precautions, NPO status , Patient's Chart, lab work & pertinent test results  Airway Mallampati: II  TM Distance: >3 FB Neck ROM: Full    Dental  (+) Missing   Pulmonary asthma , sleep apnea , former smoker,    Pulmonary exam normal breath sounds clear to auscultation       Cardiovascular hypertension, Pt. on medications Normal cardiovascular exam+ dysrhythmias Atrial Fibrillation  Rhythm:Irregular Rate:Normal  ECG: SR, rate 84  Last seen by cardiologist, Dr. Kate Sable, 03/16/2018   Neuro/Psych  Headaches, negative psych ROS   GI/Hepatic Neg liver ROS, PUD, GERD  Medicated and Controlled,  Endo/Other  diabetesHypothyroidism   Renal/GU negative Renal ROS     Musculoskeletal  (+) Arthritis , Back pain   Abdominal (+) + obese,   Peds  Hematology  (+) anemia , HLD   Anesthesia Other Findings Left shoulder impingement, acromioclavicular osteoarthritis  Reproductive/Obstetrics                            Anesthesia Physical Anesthesia Plan  ASA: III  Anesthesia Plan: General and Regional   Post-op Pain Management: GA combined w/ Regional for post-op pain   Induction:   PONV Risk Score and Plan: 3 and Ondansetron, Dexamethasone, Midazolam and Treatment may vary due to age or medical condition  Airway Management Planned: Oral ETT  Additional Equipment:   Intra-op Plan:   Post-operative Plan: Extubation in OR  Informed Consent: I have reviewed the patients History and Physical, chart, labs and discussed the procedure including the risks, benefits and alternatives for the proposed anesthesia with the patient or authorized representative who has indicated his/her understanding and acceptance.     Dental advisory given  Plan Discussed with: CRNA  Anesthesia Plan  Comments:        Anesthesia Quick Evaluation

## 2018-09-02 NOTE — Anesthesia Postprocedure Evaluation (Signed)
Anesthesia Post Note  [name]  Procedure(s) Performed: Left shoulder arthroscopy with subacromial decompression and distal clavicle resection, labral debridement, chondroplasty, bicep tenotomy (Left Shoulder)     Patient location during evaluation: PACU Anesthesia Type: Regional and General Level of consciousness: awake and alert Pain management: pain level controlled Vital Signs Assessment: post-procedure vital signs reviewed and stable Respiratory status: spontaneous breathing, nonlabored ventilation and respiratory function stable Cardiovascular status: blood pressure returned to baseline and stable Postop Assessment: no apparent nausea or vomiting Anesthetic complications: no    Last Vitals:  Vitals:   09/02/18 1715 09/02/18 1727  BP: 129/72 124/76  Pulse: 68 66  Resp:    Temp:  (!) 36.4 C  SpO2: 97% 98%    Last Pain:  Vitals:   09/02/18 1727  TempSrc:   PainSc: 0-No pain                 Lynda Rainwater

## 2018-09-02 NOTE — Discharge Instructions (Signed)
   Kevin M. Supple, M.D., F.A.A.O.S. Orthopaedic Surgery Specializing in Arthroscopic and Reconstructive Surgery of the Shoulder [phone] [address] - [place],  [address] - Fax [phone]   POST-OP SHOULDER ARTHROSCOPY INSTRUCTIONS  1. Call the office at [phone] to schedule your first post-op appointment 7-10 days from the date of your surgery.  2. Leave the steri-strips in place over your incisions when performing dressing changes and showering. You may remove your dressings and begin showering 72 hours from surgery. You can expect drainage that is clear to bloody in nature that occasionally will soak through your dressings. If this occurs go ahead and perform a dressing change. The drainage should lessen daily and when there is no drainage from your incisions feel free to go without a dressing.  3. Wear your sling for comfort. You may come out of your sling for ad lib activity and even decide not to use the sling at all. If you find you are more comfortable in your sling, make sure you come out of your sling at least 3-4 times a day to do the exercises that are included below.  4. Range of motion to your elbow, wrist, and hand are encouraged 3-5 times daily. Exercise to your hand and fingers helps to reduce swelling you may experience.  5. Utilize ice to the shoulder 3-4 times minimum a day and additionally if you are experiencing pain.  6. You may drive when safely off narcotics and muscle relaxants.  7.Pain control following an exparel block  To help control your post-operative pain you received a nerve block  performed with Exparel which is a long acting anesthetic (numbing agent) which can provide pain relief and sensations of numbness (and relief of pain) in the operative shoulder and arm for up to 3 days. Sometimes it provides mixed relief, meaning you may still have numbness in certain areas of the arm but can still be able to move  parts of that arm,  hand, and fingers. We recommend that your prescribed pain medications  be used as needed. We do not feel it is necessary to "pre medicate" and "stay ahead" of pain.  Taking narcotic pain medications when you are not having any pain can lead to unnecessary and potentially dangerous side effects.    8. Pain medications can produce constipation along with their use. If you experience this, the use of an over the counter stool softener or laxative daily is recommended.   9. For additional questions or concerns, please do not hesitate to call the office. If after hours there is an answering service to forward your concerns to the physician on call.   POST-OP EXERCISES  The pendulum exercises should be performed while bending at the waist as far over as possible thereby letting gravity do the work for you.  Range of Motion Exercises: Pendulum (circular)  Repeat 20 times. Do 3 sessions per day.     Range of Motion Exercises: Pendulum (side-to-side)  Repeat 20 times. Do 3 sessions per day.    Range of Motion Exercises (self-stretching activities):  Slide arm up wall with palm toward you, moving closer to the wall. Hold for 5 seconds.  Repeat 10 times. Do 3 sessions per day.       

## 2018-09-02 NOTE — Telephone Encounter (Signed)
Noted  
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
## 2018-09-02 NOTE — Anesthesia Procedure Notes (Addendum)
Procedure Name: Intubation Date/Time: 09/02/2018 3:27 PM Performed by: West Pugh, CRNA Pre-anesthesia Checklist: Patient identified, Emergency Drugs available, Suction available, Patient being monitored and Timeout performed Patient Re-evaluated:Patient Re-evaluated prior to induction Oxygen Delivery Method: Circle system utilized Preoxygenation: Pre-oxygenation with 100% oxygen Induction Type: IV induction Ventilation: Mask ventilation without difficulty Laryngoscope Size: Mac and 3 Grade View: Grade I Tube type: Oral Tube size: 7.0 mm Number of attempts: 1 Airway Equipment and Method: Stylet Placement Confirmation: ETT inserted through vocal cords under direct vision,  positive ETCO2 and breath sounds checked- equal and bilateral Tube secured with: Tape Dental Injury: Teeth and Oropharynx as per pre-operative assessment

## 2018-09-02 NOTE — Transfer of Care (Signed)
Immediate Anesthesia Transfer of Care Note  [name]  Procedure(s) Performed: Left shoulder arthroscopy with subacromial decompression and distal clavicle resection, labral debridement, chondroplasty, bicep tenotomy (Left Shoulder)  Patient Location: PACU  Anesthesia Type:GA combined with regional for post-op pain  Level of Consciousness: awake, alert  and patient cooperative  Airway & Oxygen Therapy: Patient Spontanous Breathing and Patient connected to face mask oxygen  Post-op Assessment: Report given to RN and Post -op Vital signs reviewed and stable  Post vital signs: Reviewed and stable  Last Vitals:  Vitals Value Taken Time  BP    Temp 36.3 C 09/02/18 1653  Pulse 68 09/02/18 1653  Resp 19 09/02/18 1653  SpO2 97 % 09/02/18 1653    Last Pain:  Vitals:   09/02/18 1653  TempSrc:   PainSc: 0-No pain      Patients Stated Pain Goal: 4 ([id number])  Complications: No apparent anesthesia complications

## 2018-09-10 DIAGNOSIS — M25512 Pain in left shoulder: Secondary | ICD-10-CM | POA: Diagnosis not present

## 2018-09-11 ENCOUNTER — Encounter (HOSPITAL_COMMUNITY): Payer: Self-pay | Admitting: Emergency Medicine

## 2018-09-11 ENCOUNTER — Emergency Department (HOSPITAL_COMMUNITY)
Admission: EM | Admit: 2018-09-11 | Discharge: 2018-09-11 | Disposition: A | Discharge status: Home / Self Care | Payer: BC Managed Care – PPO | Attending: Emergency Medicine | Admitting: Emergency Medicine

## 2018-09-11 ENCOUNTER — Other Ambulatory Visit: Payer: Self-pay

## 2018-09-11 DIAGNOSIS — Z79899 Other long term (current) drug therapy: Secondary | ICD-10-CM | POA: Insufficient documentation

## 2018-09-11 DIAGNOSIS — Z87891 Personal history of nicotine dependence: Secondary | ICD-10-CM | POA: Diagnosis not present

## 2018-09-11 DIAGNOSIS — I10 Essential (primary) hypertension: Secondary | ICD-10-CM | POA: Diagnosis not present

## 2018-09-11 DIAGNOSIS — E039 Hypothyroidism, unspecified: Secondary | ICD-10-CM | POA: Diagnosis not present

## 2018-09-11 DIAGNOSIS — K625 Hemorrhage of anus and rectum: Secondary | ICD-10-CM | POA: Insufficient documentation

## 2018-09-11 DIAGNOSIS — K5900 Constipation, unspecified: Secondary | ICD-10-CM | POA: Insufficient documentation

## 2018-09-11 MED ORDER — HYDROCORTISONE ACETATE 25 MG RE SUPP: 25.0000 mg | Freq: Two times a day (BID) | RECTAL | 1 refills | Status: AC

## 2018-09-11 MED ORDER — DOCUSATE SODIUM 100 MG PO CAPS: 100.0000 mg | ORAL_CAPSULE | Freq: Every day | ORAL | 2 refills | Status: AC | PRN

## 2018-09-11 MED ORDER — SUCRALFATE 1 GM/10ML PO SUSP
1.0000 g | Freq: Once | ORAL | Status: AC
  Administered 2018-09-11: 1 g via OROMUCOSAL
  Filled 2018-09-11: qty 10

## 2018-09-11 MED ORDER — HYDROCORTISONE ACETATE 25 MG RE SUPP
25.0000 mg | Freq: Once | RECTAL | Status: AC
  Administered 2018-09-11: 25 mg via RECTAL
  Filled 2018-09-11: qty 1
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
## 2018-09-11 NOTE — ED Triage Notes (Signed)
Patient complaining of rectal bleeding with bowel movements since last night. States she has problems with constipation and had to strain with bowel movements.

## 2018-09-11 NOTE — Discharge Instructions (Addendum)
See Dr. Oneida Alar for recheck next week.  Continue laxatives.  Return if any increased bleeding

## 2018-09-15 DIAGNOSIS — Z7901 Long term (current) use of anticoagulants: Secondary | ICD-10-CM | POA: Diagnosis not present

## 2018-09-15 DIAGNOSIS — Z4889 Encounter for other specified surgical aftercare: Secondary | ICD-10-CM | POA: Diagnosis not present

## 2018-09-15 DIAGNOSIS — I890 Lymphedema, not elsewhere classified: Secondary | ICD-10-CM | POA: Diagnosis not present

## 2018-09-15 DIAGNOSIS — I739 Peripheral vascular disease, unspecified: Secondary | ICD-10-CM | POA: Diagnosis not present

## 2018-09-15 DIAGNOSIS — M25512 Pain in left shoulder: Secondary | ICD-10-CM | POA: Diagnosis not present

## 2018-09-16 ENCOUNTER — Ambulatory Visit: Payer: BLUE CROSS/BLUE SHIELD | Admitting: Cardiovascular Disease

## 2018-09-17 ENCOUNTER — Ambulatory Visit (INDEPENDENT_AMBULATORY_CARE_PROVIDER_SITE_OTHER): Payer: BC Managed Care – PPO | Admitting: Gastroenterology

## 2018-09-17 ENCOUNTER — Other Ambulatory Visit: Payer: Self-pay

## 2018-09-17 ENCOUNTER — Encounter: Payer: Self-pay | Admitting: Gastroenterology

## 2018-09-17 VITALS — BP 119/73 | HR 77 | Temp 96.6°F | Ht 67.0 in | Wt 246.6 lb

## 2018-09-17 DIAGNOSIS — K922 Gastrointestinal hemorrhage, unspecified: Secondary | ICD-10-CM | POA: Insufficient documentation

## 2018-09-17 DIAGNOSIS — K625 Hemorrhage of anus and rectum: Secondary | ICD-10-CM | POA: Diagnosis not present
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
## 2018-09-17 NOTE — Assessment & Plan Note (Signed)
58 year old female with recent low-volume hematochezia after straining. Worsening constipation during short-term use of opioids s/p shoulder surgery recently. Unable to adequately perform rectal exam due to recent shoulder surgery and unable to lay on left side; however, she was seen in ED a few days ago with DRE completed. Likely bleeding secondary to internal hemorrhoids in setting of straining, constipation. Continue Trulance, stool softener, suppositories, add fiber, check CBC today, avoid prolonged toilet time, hemorrhoid literature provided. Return in 6 weeks for close follow-up. May need colonoscopy as last was in 2011.

## 2018-09-17 NOTE — Patient Instructions (Addendum)
Continue using the suppositories.   Add Benefiber 2 teaspoons daily to twice a day to your regimen.   Continue Trulance.  Avoid straining. Limit toilet time to only 2-3 minutes at the most.  Please call if worsening bleeding.  Please complete blood work, so we can make sure it is stable.  If things don't improve, I recommend a colonoscopy. We will see you in 6 weeks for close follow-up!  I enjoyed seeing you again today! As you know, I value our relationship and want to provide genuine, compassionate, and quality care. I welcome your feedback. If you receive a survey regarding your visit,  I greatly appreciate you taking time to fill this out. See you next time!  Becky Needs, PhD, ANP-BC [facility]   Hemorrhoids Hemorrhoids are swollen veins in and around the rectum or anus. There are two types of hemorrhoids:  Internal hemorrhoids. These occur in the veins that are just inside the rectum. They may poke through to the outside and become irritated and painful.  External hemorrhoids. These occur in the veins that are outside the anus and can be felt as a painful swelling or hard lump near the anus. Most hemorrhoids do not cause serious problems, and they can be managed with home treatments such as diet and lifestyle changes. If home treatments do not help the symptoms, procedures can be done to shrink or remove the hemorrhoids. What are the causes? This condition is caused by increased pressure in the anal area. This pressure may result from various things, including:  Constipation.  Straining to have a bowel movement.  Diarrhea.  Pregnancy.  Obesity.  Sitting for long periods of time.  Heavy lifting or other activity that causes you to strain.  Anal sex.  Riding a bike for a long period of time. What are the signs or symptoms? Symptoms of this condition include:  Pain.  Anal itching or irritation.  Rectal bleeding.  Leakage of stool  (feces).  Anal swelling.  One or more lumps around the anus. How is this diagnosed? This condition can often be diagnosed through a visual exam. Other exams or tests may also be done, such as:  An exam that involves feeling the rectal area with a gloved hand (digital rectal exam).  An exam of the anal canal that is done using a small tube (anoscope).  A blood test, if you have lost a significant amount of blood.  A test to look inside the colon using a flexible tube with a camera on the end (sigmoidoscopy or colonoscopy). How is this treated? This condition can usually be treated at home. However, various procedures may be done if dietary changes, lifestyle changes, and other home treatments do not help your symptoms. These procedures can help make the hemorrhoids smaller or remove them completely. Some of these procedures involve surgery, and others do not. Common procedures include:  Rubber band ligation. Rubber bands are placed at the base of the hemorrhoids to cut off their blood supply.  Sclerotherapy. Medicine is injected into the hemorrhoids to shrink them.  Infrared coagulation. A type of light energy is used to get rid of the hemorrhoids.  Hemorrhoidectomy surgery. The hemorrhoids are surgically removed, and the veins that supply them are tied off.  Stapled hemorrhoidopexy surgery. The surgeon staples the base of the hemorrhoid to the rectal wall. Follow these instructions at home: Eating and drinking   Eat foods that have a lot of fiber in them, such as whole grains,  beans, nuts, fruits, and vegetables.  Ask your health care provider about taking products that have added fiber (fiber supplements).  Reduce the amount of fat in your diet. You can do this by eating low-fat dairy products, eating less red meat, and avoiding processed foods.  Drink enough fluid to keep your urine pale yellow. Managing pain and swelling   Take warm sitz baths for 20 minutes, 3-4 times a  day to ease pain and discomfort. You may do this in a bathtub or using a portable sitz bath that fits over the toilet.  If directed, apply ice to the affected area. Using ice packs between sitz baths may be helpful. ? Put ice in a plastic bag. ? Place a towel between your skin and the bag. ? Leave the ice on for 20 minutes, 2-3 times a day. General instructions  Take over-the-counter and prescription medicines only as told by your health care provider.  Use medicated creams or suppositories as told.  Get regular exercise. Ask your health care provider how much and what kind of exercise is best for you. In general, you should do moderate exercise for at least 30 minutes on most days of the week (150 minutes each week). This can include activities such as walking, biking, or yoga.  Go to the bathroom when you have the urge to have a bowel movement. Do not wait.  Avoid straining to have bowel movements.  Keep the anal area dry and clean. Use wet toilet paper or moist towelettes after a bowel movement.  Do not sit on the toilet for long periods of time. This increases blood pooling and pain.  Keep all follow-up visits as told by your health care provider. This is important. Contact a health care provider if you have:  Increasing pain and swelling that are not controlled by treatment or medicine.  Difficulty having a bowel movement, or you are unable to have a bowel movement.  Pain or inflammation outside the area of the hemorrhoids. Get help right away if you have:  Uncontrolled bleeding from your rectum. Summary  Hemorrhoids are swollen veins in and around the rectum or anus.  Most hemorrhoids can be managed with home treatments such as diet and lifestyle changes.  Taking warm sitz baths can help ease pain and discomfort.  In severe cases, procedures or surgery can be done to shrink or remove the hemorrhoids. This information is not intended to replace advice given to you by  your health care provider. Make sure you discuss any questions you have with your health care provider. Document Released: 12/21/1999 Document Revised: 12/31/2017 Document Reviewed: 05/14/2017 Elsevier Patient Education  2020 [employer].

## 2018-09-20 ENCOUNTER — Telehealth: Payer: Self-pay

## 2018-09-20 ENCOUNTER — Telehealth: Payer: Self-pay | Admitting: Gastroenterology

## 2018-09-20 NOTE — Telephone Encounter (Signed)
[phone]  Patient was here last week and stated that she is still having problems, please call

## 2018-09-20 NOTE — Telephone Encounter (Signed)
Spoke with pt. Pt is still noticing bleeding. Pt states that the bleeding is seen when she has a bowel movement and when she wipes. Pt see's a little more than a teaspoon full of blood in the toilet as well. Pt has a cramping sensations from time to time in her lower abdomen that comes and goes. Pt had her labs done Friday at [facility] center. I spoke with the receptionist at [facility] and pt's labs should be in tomorrow. Lab results will be faxed to AB on 09/21/2018 per receptionist.

## 2018-09-20 NOTE — Telephone Encounter (Signed)
Manuela Schwartz said pt wanted to speak to me about patient assistance papers. She already has patient assistance papers for Dexilant and told me at her recent OV that she just needed to complete and return. I called her and left Vm for a return call.

## 2018-09-21 NOTE — Telephone Encounter (Addendum)
Hgb 11.6 on 9/14. This is down from 13.6 late Aug 2020 (prior to this 12.4 in Jan 2020).   Is the cramping associated with bowel movements and relieved thereafter? Is she still dealing with constipation? Need to know more details of abdominal pain. May need imaging.   It looks like Dr. Bronson Ing last saw her for cardiology appt in March 2020. She is on Xarelto. We may need to hold this if continued bleeding.  When is first available colonoscopy with Dr. Oneida Alar, conscious sedation?

## 2018-09-21 NOTE — Telephone Encounter (Signed)
End of November is 1st available for conscious sedation with [facility]

## 2018-09-21 NOTE — Telephone Encounter (Signed)
PT said she is still having a little bleeding every BM. The cramping is associated with Bm's when she is constipated. Today it was better. She is OK to do colonoscopy if recommended.

## 2018-09-21 NOTE — Telephone Encounter (Signed)
Lab results received and placed for AB to review

## 2018-09-22 DIAGNOSIS — M25512 Pain in left shoulder: Secondary | ICD-10-CM | POA: Diagnosis not present

## 2018-09-23 ENCOUNTER — Other Ambulatory Visit: Payer: Self-pay

## 2018-09-23 DIAGNOSIS — K625 Hemorrhage of anus and rectum: Secondary | ICD-10-CM

## 2018-09-23 NOTE — Telephone Encounter (Signed)
Please check CBC next week. We need to arrange a colonoscopy with Dr. Oneida Alar. Need to check with cardiology about holding Xarelto X 48 hours prior.

## 2018-09-23 NOTE — Telephone Encounter (Signed)
PT is aware. Becky [name] came by the office and was informed. Becky [name] said Becky [name] was bleeding a little more than we realized. I told her to go to the lab tomorrow if Becky [name] felt Becky [name] was loosing more blood. Becky [name] is aware we will check with cardiologist about holding Xarelto and scheduling her for a colonoscopy with Dr. Oneida Alar in the very near future.

## 2018-09-23 NOTE — Telephone Encounter (Signed)
I spoke with pt and the PA Assistance papers need to be updated. She is coming by the office to do so.

## 2018-09-24 ENCOUNTER — Telehealth: Payer: Self-pay

## 2018-09-24 DIAGNOSIS — M25512 Pain in left shoulder: Secondary | ICD-10-CM | POA: Diagnosis not present

## 2018-09-24 NOTE — Telephone Encounter (Signed)
Forwarding to [facility] to schedule.  

## 2018-09-24 NOTE — Telephone Encounter (Signed)
AB please advise regarding diagnosis? Thanks

## 2018-09-24 NOTE — Telephone Encounter (Signed)
Noted will schedule from prior note

## 2018-09-24 NOTE — Telephone Encounter (Addendum)
Patient called back. She is scheduled for TCS with [facility] 12/11 at 8:30am. Patient aware will mail instructions with covid-19 testing appt. Patient wants to know what she should do about her problem in the meantime? Also please advise diagnosis for procedure? Please advise AB thanks

## 2018-09-24 NOTE — Telephone Encounter (Signed)
That is fine 

## 2018-09-24 NOTE — Telephone Encounter (Signed)
FYI to Walden Field, NP who is doing hospital call today ( in [place]). See separate note to Dr. Jacinta Shoe, working on scheduling the colonoscopy. Forwarding to [facility] staff to schedule.

## 2018-09-24 NOTE — Telephone Encounter (Signed)
Dr. Jacinta Shoe,   This mutual patient has been having some rectal bleeding and we plan to schedule her for a colonoscopy with Dr. Oneida Alar in the very near future.  Please advise if she may HOLD XARELTO for 48 hours prior to procedure.  Thanks so much!  Everardo All, LPN

## 2018-09-27 NOTE — Telephone Encounter (Signed)
Spoke with pt. She is going to have labs done tomorrow. Pt doesn't have any Anusol cr or creams similar. Pt has a refill on suppositories that were given at the hospital. Pt states that the first bowel movement of the day may have a little blood in it and the other bowel movements for the day have no blood. Pt is aware if her symptoms change with dizziness, ect, she is to got to the ED.

## 2018-09-27 NOTE — Telephone Encounter (Signed)
Becky [name]: let's check on patient. Needs CBC today. We need to make sure Hgb is stable. Can we have an update on rectal bleeding? Does she have anusol or similar to use per rectum? If her bleeding is persistent, increases, associated with weakness, dizziness, etc, needs to seek medical attention.

## 2018-09-28 ENCOUNTER — Other Ambulatory Visit: Payer: Self-pay | Admitting: Cardiovascular Disease

## 2018-09-28 ENCOUNTER — Other Ambulatory Visit: Payer: Self-pay | Admitting: *Deleted

## 2018-09-28 DIAGNOSIS — K625 Hemorrhage of anus and rectum: Secondary | ICD-10-CM

## 2018-09-28 NOTE — Addendum Note (Signed)
Addended by: Cheron Every on: 09/28/2018 10:59 AM   Modules accepted: Orders

## 2018-09-29 DIAGNOSIS — M25512 Pain in left shoulder: Secondary | ICD-10-CM | POA: Diagnosis not present

## 2018-09-30 ENCOUNTER — Telehealth: Payer: Self-pay

## 2018-09-30 NOTE — Telephone Encounter (Signed)
Received CBC results from specimen collected on 09/28/2018.  Hemoglobin was was normal at 11.7. Placing in box for [name], NP who ordered it.

## 2018-09-30 NOTE — Telephone Encounter (Signed)
Spoke with pt. She had her labs drawn Tuesday. I'm trying to reach [facility] for the results.

## 2018-09-30 NOTE — Telephone Encounter (Signed)
Can we see if patient completed labs? I am off on 9/25, so if urgent, please address with another APP

## 2018-10-01 DIAGNOSIS — M25512 Pain in left shoulder: Secondary | ICD-10-CM | POA: Diagnosis not present

## 2018-10-01 NOTE — Telephone Encounter (Signed)
Becky [name]: I think Becky [name] addressed this without me realizing it. They should be in my office. Thanks for your help!

## 2018-10-04 NOTE — Telephone Encounter (Signed)
Noted  

## 2018-10-05 NOTE — Telephone Encounter (Signed)
Reviewed results. WBC count 7.5, Hgb 11.7, Hct 35.0, platelets 328. This is stable from Sept 14th. However, was 13.7 in Aug 2020. Continue with plans for colonoscopy. How is she doing?

## 2018-10-06 ENCOUNTER — Other Ambulatory Visit: Payer: Self-pay | Admitting: Nurse Practitioner

## 2018-10-06 DIAGNOSIS — K5900 Constipation, unspecified: Secondary | ICD-10-CM

## 2018-10-06 DIAGNOSIS — M25512 Pain in left shoulder: Secondary | ICD-10-CM | POA: Diagnosis not present

## 2018-10-07 NOTE — Telephone Encounter (Signed)
FYI to AB.  

## 2018-10-07 NOTE — Telephone Encounter (Signed)
PT is aware of results, is doing much better and not seeing any blood now, she will keep appointment for the colonoscopy.

## 2018-10-08 DIAGNOSIS — M25512 Pain in left shoulder: Secondary | ICD-10-CM | POA: Diagnosis not present

## 2018-10-11 DIAGNOSIS — M25512 Pain in left shoulder: Secondary | ICD-10-CM | POA: Diagnosis not present

## 2018-10-13 DIAGNOSIS — M25512 Pain in left shoulder: Secondary | ICD-10-CM | POA: Diagnosis not present

## 2018-10-18 DIAGNOSIS — M25512 Pain in left shoulder: Secondary | ICD-10-CM | POA: Diagnosis not present

## 2018-10-20 DIAGNOSIS — M25512 Pain in left shoulder: Secondary | ICD-10-CM | POA: Diagnosis not present

## 2018-10-22 ENCOUNTER — Ambulatory Visit (INDEPENDENT_AMBULATORY_CARE_PROVIDER_SITE_OTHER): Payer: BC Managed Care – PPO | Admitting: Cardiovascular Disease

## 2018-10-22 ENCOUNTER — Encounter: Payer: Self-pay | Admitting: Cardiovascular Disease

## 2018-10-22 ENCOUNTER — Other Ambulatory Visit: Payer: Self-pay

## 2018-10-22 VITALS — BP 125/78 | HR 85 | Temp 97.5°F | Ht 67.0 in | Wt 257.0 lb

## 2018-10-22 DIAGNOSIS — I480 Paroxysmal atrial fibrillation: Secondary | ICD-10-CM

## 2018-10-22 DIAGNOSIS — R0609 Other forms of dyspnea: Secondary | ICD-10-CM

## 2018-10-22 DIAGNOSIS — R9439 Abnormal result of other cardiovascular function study: Secondary | ICD-10-CM

## 2018-10-22 DIAGNOSIS — E782 Mixed hyperlipidemia: Secondary | ICD-10-CM

## 2018-10-22 DIAGNOSIS — I119 Hypertensive heart disease without heart failure: Secondary | ICD-10-CM

## 2018-10-22 DIAGNOSIS — R0600 Dyspnea, unspecified: Secondary | ICD-10-CM

## 2018-10-22 DIAGNOSIS — R0602 Shortness of breath: Secondary | ICD-10-CM

## 2018-10-22 DIAGNOSIS — I10 Essential (primary) hypertension: Secondary | ICD-10-CM

## 2018-10-22 NOTE — Patient Instructions (Signed)
Medication Instructions:  Your physician recommends that you continue on your current medications as directed. Please refer to the Current Medication list given to you today.   Labwork: NONE  Testing/Procedures: Your physician has requested that you have a lexiscan myoview. For further information please visit [email or web address]. Please follow instruction sheet, as given.    Follow-Up: Your physician wants you to follow-up in: 6 MONTHS.  You will receive a reminder letter in the mail two months in advance. If you don't receive a letter, please call our office to schedule the follow-up appointment.    Any Other Special Instructions Will Be Listed Below (If Applicable).     If you need a refill on your cardiac medications before your next appointment, please call your pharmacy.   
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
## 2018-10-25 DIAGNOSIS — M25512 Pain in left shoulder: Secondary | ICD-10-CM | POA: Diagnosis not present

## 2018-10-27 DIAGNOSIS — M25512 Pain in left shoulder: Secondary | ICD-10-CM | POA: Diagnosis not present

## 2018-11-01 DIAGNOSIS — M25512 Pain in left shoulder: Secondary | ICD-10-CM | POA: Diagnosis not present

## 2018-11-02 ENCOUNTER — Encounter: Payer: Self-pay | Admitting: Gastroenterology

## 2018-11-02 ENCOUNTER — Telehealth: Payer: Self-pay | Admitting: Gastroenterology

## 2018-11-02 ENCOUNTER — Ambulatory Visit: Payer: BC Managed Care – PPO | Admitting: Gastroenterology

## 2018-11-02 NOTE — Telephone Encounter (Signed)
PATIENT WAS A NO SHOW AND LETTER SENT  °

## 2018-11-03 DIAGNOSIS — M25512 Pain in left shoulder: Secondary | ICD-10-CM | POA: Diagnosis not present

## 2018-11-04 ENCOUNTER — Encounter (HOSPITAL_COMMUNITY)
Admission: RE | Admit: 2018-11-04 | Discharge: 2018-11-04 | Disposition: A | Discharge status: Home / Self Care | Payer: BC Managed Care – PPO | Source: Ambulatory Visit | Attending: Cardiovascular Disease | Admitting: Cardiovascular Disease

## 2018-11-04 ENCOUNTER — Encounter (HOSPITAL_BASED_OUTPATIENT_CLINIC_OR_DEPARTMENT_OTHER)
Admission: RE | Admit: 2018-11-04 | Discharge: 2018-11-04 | Disposition: A | Discharge status: Home / Self Care | Payer: BC Managed Care – PPO | Source: Ambulatory Visit | Attending: Cardiovascular Disease | Admitting: Cardiovascular Disease

## 2018-11-04 ENCOUNTER — Other Ambulatory Visit: Payer: Self-pay

## 2018-11-04 DIAGNOSIS — R0600 Dyspnea, unspecified: Secondary | ICD-10-CM | POA: Diagnosis not present

## 2018-11-04 DIAGNOSIS — R0609 Other forms of dyspnea: Secondary | ICD-10-CM

## 2018-11-04 LAB — NM MYOCAR MULTI W/SPECT W/WALL MOTION / EF
LV dias vol: 94 mL (ref 46–106)
LV sys vol: 35 mL
Peak HR: 86 {beats}/min
RATE: 0.31
Rest HR: 64 {beats}/min
SDS: 3
SRS: 0
SSS: 3
TID: 1.23

## 2018-11-04 MED ORDER — TECHNETIUM TC 99M TETROFOSMIN IV KIT
10.0000 | PACK | Freq: Once | INTRAVENOUS | Status: AC | PRN
  Administered 2018-11-04: 10.36 via INTRAVENOUS

## 2018-11-04 MED ORDER — SODIUM CHLORIDE FLUSH 0.9 % IV SOLN
INTRAVENOUS | Status: AC
  Administered 2018-11-04: 10 mL via INTRAVENOUS
  Filled 2018-11-04: qty 10

## 2018-11-04 MED ORDER — TECHNETIUM TC 99M TETROFOSMIN IV KIT
30.0000 | PACK | Freq: Once | INTRAVENOUS | Status: AC | PRN
  Administered 2018-11-04: 29 via INTRAVENOUS

## 2018-11-04 MED ORDER — REGADENOSON 0.4 MG/5ML IV SOLN
INTRAVENOUS | Status: AC
  Administered 2018-11-04: 5 mL via INTRAVENOUS
  Filled 2018-11-04: qty 5

## 2018-11-08 DIAGNOSIS — M25512 Pain in left shoulder: Secondary | ICD-10-CM | POA: Diagnosis not present

## 2018-11-11 DIAGNOSIS — M25512 Pain in left shoulder: Secondary | ICD-10-CM | POA: Diagnosis not present

## 2018-11-12 ENCOUNTER — Ambulatory Visit (INDEPENDENT_AMBULATORY_CARE_PROVIDER_SITE_OTHER): Payer: BC Managed Care – PPO | Admitting: Gastroenterology

## 2018-11-12 ENCOUNTER — Other Ambulatory Visit: Payer: Self-pay

## 2018-11-12 ENCOUNTER — Encounter: Payer: Self-pay | Admitting: Gastroenterology

## 2018-11-12 VITALS — BP 125/75 | HR 68 | Temp 97.2°F | Ht 67.0 in | Wt 259.0 lb

## 2018-11-12 DIAGNOSIS — K219 Gastro-esophageal reflux disease without esophagitis: Secondary | ICD-10-CM

## 2018-11-12 DIAGNOSIS — K625 Hemorrhage of anus and rectum: Secondary | ICD-10-CM | POA: Diagnosis not present

## 2018-11-12 MED ORDER — PANTOPRAZOLE SODIUM 40 MG PO TBEC: 40.0000 mg | DELAYED_RELEASE_TABLET | Freq: Every day | ORAL | 3 refills | Status: DC

## 2018-11-12 NOTE — Progress Notes (Signed)
cc'ed to pcp °
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
## 2018-11-12 NOTE — Patient Instructions (Signed)
I have sent in Protonix to take once each morning, 30 minutes before breakfast.   We are double checking on the patient assistance forms. Please call us Tuesday or Wednesday if you have not heard anything.  Please keep plans for colonoscopy upcoming.  Return in 6 months!  Have a great holiday season!  I enjoyed seeing you again today! As you know, I value our relationship and want to provide genuine, compassionate, and quality care. I welcome your feedback. If you receive a survey regarding your visit,  I greatly appreciate you taking time to fill this out. See you next time!  [name], PhD, ANP-BC [facility]

## 2018-11-15 ENCOUNTER — Telehealth: Payer: Self-pay

## 2018-11-15 NOTE — Telephone Encounter (Signed)
Pt returned call to [facility].  Informed pt that [facility] faxed Bernita Buffy again this morning and received confirmation.  I told pt that I would have Doris to call her if there was any additional information that she wanted to relay to her.  Pt voiced understanding.

## 2018-11-15 NOTE — Telephone Encounter (Signed)
I called pt and explained to her. I told her also that the Protonix that [facility] sent in in the meantime would need a PA. Vicente Males said for her to just take the Omeprazole for now while we are working on getting the [place]. She is good with that.

## 2018-11-15 NOTE — Telephone Encounter (Signed)
Becky [name],   My paperwork for the Pt assistance for Dexilant was faxed to [facility] on 10/07/2018. I also received confirmation that it was received and it is attached to the paperwork that was faxed. I called Bernita Buffy this morning @ [phone] and spoke to [place] and she said it was never received. She asked me to fax again and give them 48-72 hours to receive. She told me to check back with them on Thursday to make sure that it was received. I tried to call pt to inform her, and left VM for a return call.

## 2018-11-15 NOTE — Telephone Encounter (Signed)
Received confirmation again, that this was received this morning when I faxed.

## 2018-11-17 DIAGNOSIS — M25512 Pain in left shoulder: Secondary | ICD-10-CM | POA: Diagnosis not present

## 2018-11-17 NOTE — Telephone Encounter (Signed)
I called to see status of the request and spoke to Judson Roch, who said it was received and to give a few more days to process.

## 2018-11-19 DIAGNOSIS — M25512 Pain in left shoulder: Secondary | ICD-10-CM | POA: Diagnosis not present

## 2018-11-22 DIAGNOSIS — M25512 Pain in left shoulder: Secondary | ICD-10-CM | POA: Diagnosis not present

## 2018-11-22 NOTE — Telephone Encounter (Signed)
I called back today and spoke to [place]. He said it has not been process because the application was on outdated form. ( This is what we printed from their website) I asked him to fax over a current form with out her name on it so I can make a few copies to have on hand. He said he would do so. When the form gets here I will call pt and give update and then have her come by to sign.

## 2018-11-23 ENCOUNTER — Other Ambulatory Visit: Payer: Self-pay

## 2018-11-23 ENCOUNTER — Encounter: Payer: Self-pay | Admitting: Adult Health

## 2018-11-23 ENCOUNTER — Ambulatory Visit (INDEPENDENT_AMBULATORY_CARE_PROVIDER_SITE_OTHER): Payer: BC Managed Care – PPO | Admitting: Adult Health

## 2018-11-23 ENCOUNTER — Other Ambulatory Visit (HOSPITAL_COMMUNITY)
Admission: RE | Admit: 2018-11-23 | Discharge: 2018-11-23 | Disposition: A | Discharge status: Home / Self Care | Payer: BC Managed Care – PPO | Source: Ambulatory Visit | Attending: Adult Health | Admitting: Adult Health

## 2018-11-23 VITALS — BP 137/82 | HR 77 | Ht 67.0 in | Wt 259.5 lb

## 2018-11-23 DIAGNOSIS — N898 Other specified noninflammatory disorders of vagina: Secondary | ICD-10-CM

## 2018-11-23 MED ORDER — NYSTATIN-TRIAMCINOLONE 100000-0.1 UNIT/GM-% EX CREA: 1.0000 "application " | TOPICAL_CREAM | Freq: Two times a day (BID) | CUTANEOUS | 2 refills | Status: DC

## 2018-11-23 NOTE — Progress Notes (Signed)
  Subjective:     Patient ID: Kash Pezzano, female   DOB: 1960/12/07, 58 y.o.   MRN: [id number]  HPI Eldonna is a 58 year old black female, married, PM in complaining of vaginal irritation and itching for about a week.Seems to be inner labia the most. PCP is Dr Gerarda Fraction.  Review of Systems Vaginal irritation and itching for a week now,seems better today May have used new toilet paper Seems to be inner labia the most  Reviewed past medical,surgical, social and family history. Reviewed medications and allergies.     Objective:   Physical Exam BP 137/82 (BP Location: Left Arm, Patient Position: Sitting, Cuff Size: Large)   Pulse 77   Ht 5\' 7"  (1.702 m)   Wt 259 lb 8 oz (117.7 kg)   BMI 40.64 kg/m  Skin warm and dry.Pelvic: external genitalia is normal in appearance no lesions, vagina: white discharge without odor,urethra has no lesions or masses noted, cervix:smooth and bulbous, uterus: normal size, shape and contour, non tender, no masses felt, adnexa: no masses or tenderness noted. Bladder is non tender and no masses felt CV swab obtained. Fall risk is low Examination chaperoned by Levy Pupa LPN    Assessment:     1. Vaginal itching   2. Vaginal discharge       Plan:     CV swab sent for trich,BV and yeast Meds ordered this encounter  Medications  . nystatin-triamcinolone (MYCOLOG II) cream    Sig: Apply 1 application topically 2 (two) times daily.    Dispense:  30 g    Refill:  2    Order Specific Question:   Supervising Provider    Answer:   Tania Ade H [[id number]]  Follow up prn

## 2018-11-24 ENCOUNTER — Telehealth: Payer: Self-pay | Admitting: Adult Health

## 2018-11-24 DIAGNOSIS — Z23 Encounter for immunization: Secondary | ICD-10-CM | POA: Diagnosis not present

## 2018-11-24 LAB — CERVICOVAGINAL ANCILLARY ONLY
Bacterial Vaginitis (gardnerella): POSITIVE — AB
Candida Glabrata: NEGATIVE
Candida Vaginitis: POSITIVE — AB
Comment: NEGATIVE
Comment: NEGATIVE
Comment: NEGATIVE
Comment: NEGATIVE
Trichomonas: NEGATIVE

## 2018-11-24 MED ORDER — METRONIDAZOLE 500 MG PO TABS: 500.0000 mg | ORAL_TABLET | Freq: Two times a day (BID) | ORAL | 0 refills | Status: DC

## 2018-11-24 MED ORDER — FLUCONAZOLE 150 MG PO TABS: ORAL_TABLET | ORAL | 1 refills | Status: DC

## 2018-11-24 NOTE — Telephone Encounter (Signed)
Pt aware that CV swab +BV and yeast, will rx flagyl and diflucan

## 2018-11-25 NOTE — Telephone Encounter (Signed)
I did not receive a form from [place].  I downloaded another form today that has 04/2018 on it.  I called to make sure this would be correct.  I spoke to [place] who said that either the 2019 or the 2020 would be OK.  Then I called back and was told they never received the 2019 form.  I told Elie Goody that I had been told it was received but that it was on the wrong dated form.  I expressed my frustration in the difficulty this is to get assistance for the pt.  He asked permission from his supervisor to use her fax number of [phone].  I have faxed the 2019 form that was already complete and received confirmation that it was received.

## 2018-11-26 ENCOUNTER — Telehealth: Payer: Self-pay

## 2018-11-26 DIAGNOSIS — M25512 Pain in left shoulder: Secondary | ICD-10-CM | POA: Diagnosis not present

## 2018-11-26 DIAGNOSIS — E119 Type 2 diabetes mellitus without complications: Secondary | ICD-10-CM | POA: Diagnosis not present

## 2018-11-26 MED ORDER — DEXLANSOPRAZOLE 60 MG PO CPDR: 60.0000 mg | DELAYED_RELEASE_CAPSULE | Freq: Every day | ORAL | 3 refills | Status: DC

## 2018-11-26 NOTE — Addendum Note (Signed)
Addended by: [name] on: 11/26/2018 10:17 AM   Modules accepted: Orders

## 2018-11-26 NOTE — Telephone Encounter (Signed)
Pt is aware I am still working on [place] for [employer].  Please see separate note to refill box.

## 2018-11-26 NOTE — Telephone Encounter (Signed)
PT called and is aware I am still working on Patient Assistance for [employer].  She said her husband has insurance also and she thinks that she might can get it through his insurance.  She asked that we would send RX [facility] the Dexilant to [facility] [place].   Forwarding to the refill box.

## 2018-11-26 NOTE — Telephone Encounter (Signed)
Completed.

## 2018-11-29 ENCOUNTER — Telehealth: Payer: Self-pay | Admitting: *Deleted

## 2018-11-29 DIAGNOSIS — M25512 Pain in left shoulder: Secondary | ICD-10-CM | POA: Diagnosis not present

## 2018-11-29 NOTE — Telephone Encounter (Signed)
LMOVM that 14 tablets of Flagyl were sent to her pharmacy for her to take 1 tablet 2 times daily.  Advised to check back with her pharmacy.

## 2018-11-29 NOTE — Telephone Encounter (Signed)
Pt states Anderson Malta called in flagyl but only got one pill.

## 2018-12-06 ENCOUNTER — Telehealth: Payer: Self-pay | Admitting: *Deleted

## 2018-12-06 DIAGNOSIS — M25512 Pain in left shoulder: Secondary | ICD-10-CM | POA: Diagnosis not present

## 2018-12-06 NOTE — Telephone Encounter (Signed)
I spoke with the patient and she is aware that we are moving her procedure to 12/28 at 2:30.  Mindy, can you please send updated instructions and call Endo please

## 2018-12-06 NOTE — Telephone Encounter (Signed)
LMOVM for pt to r/s procedure scheduled for 12/17/2018.

## 2018-12-07 NOTE — Telephone Encounter (Signed)
Called endo and LMOVM making aware of appt change. New instructions new covid appt has been mailed to patient.

## 2018-12-08 DIAGNOSIS — M25512 Pain in left shoulder: Secondary | ICD-10-CM | POA: Diagnosis not present

## 2018-12-15 ENCOUNTER — Other Ambulatory Visit (HOSPITAL_COMMUNITY): Payer: BC Managed Care – PPO

## 2018-12-17 DIAGNOSIS — M25512 Pain in left shoulder: Secondary | ICD-10-CM | POA: Diagnosis not present

## 2018-12-20 DIAGNOSIS — M25512 Pain in left shoulder: Secondary | ICD-10-CM | POA: Diagnosis not present

## 2018-12-22 DIAGNOSIS — M25512 Pain in left shoulder: Secondary | ICD-10-CM | POA: Diagnosis not present

## 2018-12-31 ENCOUNTER — Other Ambulatory Visit: Payer: Self-pay | Admitting: Gastroenterology

## 2019-01-01 ENCOUNTER — Other Ambulatory Visit: Payer: Self-pay

## 2019-01-01 ENCOUNTER — Other Ambulatory Visit (HOSPITAL_COMMUNITY)
Admission: RE | Admit: 2019-01-01 | Discharge: 2019-01-01 | Disposition: A | Discharge status: Home / Self Care | Payer: BC Managed Care – PPO | Source: Ambulatory Visit | Attending: Gastroenterology | Admitting: Gastroenterology

## 2019-01-01 DIAGNOSIS — Z20828 Contact with and (suspected) exposure to other viral communicable diseases: Secondary | ICD-10-CM | POA: Insufficient documentation

## 2019-01-01 DIAGNOSIS — Z01812 Encounter for preprocedural laboratory examination: Secondary | ICD-10-CM | POA: Diagnosis not present

## 2019-01-01 LAB — SARS CORONAVIRUS 2 (TAT 6-24 HRS): SARS Coronavirus 2: NEGATIVE

## 2019-01-02 ENCOUNTER — Other Ambulatory Visit: Payer: Self-pay | Admitting: Adult Health

## 2019-01-03 ENCOUNTER — Other Ambulatory Visit: Payer: Self-pay

## 2019-01-03 ENCOUNTER — Encounter (HOSPITAL_COMMUNITY)
Admission: RE | Disposition: A | Discharge status: Home / Self Care | Payer: Self-pay | Source: Home / Self Care | Attending: Gastroenterology

## 2019-01-03 ENCOUNTER — Encounter (HOSPITAL_COMMUNITY): Payer: Self-pay | Admitting: Gastroenterology

## 2019-01-03 ENCOUNTER — Telehealth: Payer: Self-pay | Admitting: *Deleted

## 2019-01-03 ENCOUNTER — Ambulatory Visit (HOSPITAL_COMMUNITY)
Admission: RE | Admit: 2019-01-03 | Discharge: 2019-01-03 | Disposition: A | Discharge status: Home / Self Care | Payer: BC Managed Care – PPO | Attending: Gastroenterology | Admitting: Gastroenterology

## 2019-01-03 DIAGNOSIS — Z7901 Long term (current) use of anticoagulants: Secondary | ICD-10-CM | POA: Insufficient documentation

## 2019-01-03 DIAGNOSIS — K921 Melena: Secondary | ICD-10-CM | POA: Insufficient documentation

## 2019-01-03 DIAGNOSIS — I10 Essential (primary) hypertension: Secondary | ICD-10-CM | POA: Insufficient documentation

## 2019-01-03 DIAGNOSIS — K648 Other hemorrhoids: Secondary | ICD-10-CM | POA: Insufficient documentation

## 2019-01-03 DIAGNOSIS — Z79890 Hormone replacement therapy (postmenopausal): Secondary | ICD-10-CM | POA: Insufficient documentation

## 2019-01-03 DIAGNOSIS — K635 Polyp of colon: Secondary | ICD-10-CM | POA: Diagnosis not present

## 2019-01-03 DIAGNOSIS — K5730 Diverticulosis of large intestine without perforation or abscess without bleeding: Secondary | ICD-10-CM | POA: Diagnosis not present

## 2019-01-03 DIAGNOSIS — E039 Hypothyroidism, unspecified: Secondary | ICD-10-CM | POA: Insufficient documentation

## 2019-01-03 DIAGNOSIS — D123 Benign neoplasm of transverse colon: Secondary | ICD-10-CM | POA: Insufficient documentation

## 2019-01-03 DIAGNOSIS — Z87891 Personal history of nicotine dependence: Secondary | ICD-10-CM | POA: Insufficient documentation

## 2019-01-03 DIAGNOSIS — K625 Hemorrhage of anus and rectum: Secondary | ICD-10-CM | POA: Diagnosis not present

## 2019-01-03 DIAGNOSIS — G4730 Sleep apnea, unspecified: Secondary | ICD-10-CM | POA: Diagnosis not present

## 2019-01-03 DIAGNOSIS — I480 Paroxysmal atrial fibrillation: Secondary | ICD-10-CM | POA: Insufficient documentation

## 2019-01-03 DIAGNOSIS — E785 Hyperlipidemia, unspecified: Secondary | ICD-10-CM | POA: Insufficient documentation

## 2019-01-03 DIAGNOSIS — K621 Rectal polyp: Secondary | ICD-10-CM | POA: Diagnosis not present

## 2019-01-03 DIAGNOSIS — Z79899 Other long term (current) drug therapy: Secondary | ICD-10-CM | POA: Insufficient documentation

## 2019-01-03 DIAGNOSIS — Q438 Other specified congenital malformations of intestine: Secondary | ICD-10-CM | POA: Insufficient documentation

## 2019-01-03 HISTORY — PX: POLYPECTOMY: SHX5525

## 2019-01-03 HISTORY — PX: COLONOSCOPY: SHX5424

## 2019-01-03 SURGERY — COLONOSCOPY
Anesthesia: Moderate Sedation

## 2019-01-03 MED ORDER — MEPERIDINE HCL 100 MG/ML IJ SOLN
INTRAMUSCULAR | Status: DC | PRN
  Administered 2019-01-03: 25 mg via INTRAVENOUS
  Administered 2019-01-03: 50 mg via INTRAVENOUS

## 2019-01-03 MED ORDER — MEPERIDINE HCL 100 MG/ML IJ SOLN
INTRAMUSCULAR | Status: AC
  Filled 2019-01-03: qty 2

## 2019-01-03 MED ORDER — MIDAZOLAM HCL 5 MG/5ML IJ SOLN
INTRAMUSCULAR | Status: DC | PRN
  Administered 2019-01-03: 2 mg via INTRAVENOUS
  Administered 2019-01-03: 1 mg via INTRAVENOUS
  Administered 2019-01-03: 2 mg via INTRAVENOUS

## 2019-01-03 MED ORDER — SODIUM CHLORIDE 0.9 % IV SOLN: INTRAVENOUS | Status: DC

## 2019-01-03 MED ORDER — MIDAZOLAM HCL 5 MG/5ML IJ SOLN
INTRAMUSCULAR | Status: AC
  Filled 2019-01-03: qty 10

## 2019-01-03 MED ORDER — STERILE WATER FOR IRRIGATION IR SOLN
Status: DC | PRN
  Administered 2019-01-03: 1.5 mL

## 2019-01-03 NOTE — Telephone Encounter (Signed)
Patient called wanting on refill on BV medication or different medication. Telephoned patient at home number and left message with son to return call to office.
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
## 2019-01-03 NOTE — Discharge Instructions (Signed)
You have small internal hemorrhoids, WHICH IS THE MOST LIKELY REASON FOR YOUR RECTAL BLEEDING. YOU HAVE diverticulosis IN YOUR LEFT COLON. YOU HAD NINE SMALL POLYPS REMOVED.    RE-START XARELTO ON DEC 29.  DRINK WATER TO KEEP YOUR URINE LIGHT YELLOW.  CONTINUE YOUR WEIGHT LOSS EFFORTS. YOUR BODY MASS INDEX IS OVER 30 WHICH MEANS YOU ARE OBESE. OBESITY IS ASSOCIATED WITH AN INCREASE FOR ALL CANCERS, INCLUDING ESOPHAGEAL AND COLON CANCER.  FOLLOW A HIGH FIBER DIET. AVOID ITEMS THAT CAUSE BLOATING. See info below.   CONTINUE TRULANCE.  USE PREPARATION H FOUR TIMES  A DAY IF NEEDED TO RELIEVE RECTAL PAIN/PRESSURE/BLEEDING.   YOUR next colonoscopy WILL BE SCHEDULED BASED ON YOUR FINAL PATHOLOGY REPORT.  Colonoscopy Care After Read the instructions outlined below and refer to this sheet in the next week. These discharge instructions provide you with general information on caring for yourself after you leave the hospital. While your treatment has been planned according to the most current medical practices available, unavoidable complications occasionally occur. If you have any problems or questions after discharge, call DR. Clester Chlebowski, [phone].  ACTIVITY  You may resume your regular activity, but move at a slower pace for the next 24 hours.   Take frequent rest periods for the next 24 hours.   Walking will help get rid of the air and reduce the bloated feeling in your belly (abdomen).   No driving for 24 hours (because of the medicine (anesthesia) used during the test).   You may shower.   Do not sign any important legal documents or operate any machinery for 24 hours (because of the anesthesia used during the test).    NUTRITION  Drink plenty of fluids.   You may resume your normal diet as instructed by your doctor.   Begin with a light meal and progress to your normal diet. Heavy or fried foods are harder to digest and may make you feel sick to your stomach (nauseated).    Avoid alcoholic beverages for 24 hours or as instructed.    MEDICATIONS  You may resume your normal medications.   WHAT YOU CAN EXPECT TODAY  Some feelings of bloating in the abdomen.   Passage of more gas than usual.   Spotting of blood in your stool or on the toilet paper  .  IF YOU HAD POLYPS REMOVED DURING THE COLONOSCOPY:  Eat a soft diet IF YOU HAVE NAUSEA, BLOATING, ABDOMINAL PAIN, OR VOMITING.    FINDING OUT THE RESULTS OF YOUR TEST Not all test results are available during your visit. DR. Oneida Alar WILL CALL YOU WITHIN 14 DAYS OF YOUR PROCEDUE WITH YOUR RESULTS. Do not assume everything is normal if you have not heard from DR. Yariana Hoaglund, CALL HER OFFICE AT [phone].  SEEK IMMEDIATE MEDICAL ATTENTION AND CALL THE OFFICE: [phone] IF:  You have more than a spotting of blood in your stool.   Your belly is swollen (abdominal distention).   You are nauseated or vomiting.   You have a temperature over 101F.   You have abdominal pain or discomfort that is severe or gets worse throughout the day.  High-Fiber Diet A high-fiber diet changes your normal diet to include more whole grains, legumes, fruits, and vegetables. Changes in the diet involve replacing refined carbohydrates with unrefined foods. The calorie level of the diet is essentially unchanged. The Dietary Reference Intake (recommended amount) for adult males is 38 grams per day. For adult females, it is 25 grams per day. Pregnant  and lactating women should consume 28 grams of fiber per day. Fiber is the intact part of a plant that is not broken down during digestion. Functional fiber is fiber that has been isolated from the plant to provide a beneficial effect in the body.  PURPOSE  Increase stool bulk.   Ease and regulate bowel movements.   Lower cholesterol.   REDUCE RISK OF COLON CANCER  INDICATIONS THAT YOU NEED MORE FIBER  Constipation and hemorrhoids.   Uncomplicated diverticulosis  (intestine condition) and irritable bowel syndrome.   Weight management.   As a protective measure against hardening of the arteries (atherosclerosis), diabetes, and cancer.   GUIDELINES FOR INCREASING FIBER IN THE DIET  Start adding fiber to the diet slowly. A gradual increase of about 5 more grams (2 servings of most fruits or vegetables) per day is best. Too rapid an increase in fiber may result in constipation, flatulence, and bloating.   Drink enough water and fluids to keep your urine clear or pale yellow. Water, juice, or caffeine-free drinks are recommended. Not drinking enough fluid may cause constipation.   Eat a variety of high-fiber foods rather than one type of fiber.   Try to increase your intake of fiber through using high-fiber foods rather than fiber pills or supplements that contain small amounts of fiber.   The goal is to change the types of food eaten. Do not supplement your present diet with high-fiber foods, but replace foods in your present diet.    Polyps, Colon  A polyp is extra tissue that grows inside your body. Colon polyps grow in the large intestine. The large intestine, also called the colon, is part of your digestive system. It is a long, hollow tube at the end of your digestive tract where your body makes and stores stool. Most polyps are not dangerous. They are benign. This means they are not cancerous. But over time, some types of polyps can turn into cancer. Polyps that are smaller than a pea are usually not harmful. But larger polyps could someday become or may already be cancerous. To be safe, doctors remove all polyps and test them.   PREVENTION There is not one sure way to prevent polyps. You might be able to lower your risk of getting them if you:  Eat more fruits and vegetables and less fatty food.   Do not smoke.   Avoid alcohol.   Exercise every day.   Lose weight if you are overweight.   Eating more calcium and folate can also lower your  risk of getting polyps. Some foods that are rich in calcium are milk, cheese, and broccoli. Some foods that are rich in folate are chickpeas, kidney beans, and spinach.    Diverticulosis Diverticulosis is a common condition that develops when small pouches (diverticula) form in the wall of the colon. The risk of diverticulosis increases with age. It happens more often in people who eat a low-fiber diet. Most individuals with diverticulosis have no symptoms. Those individuals with symptoms usually experience belly (abdominal) pain, constipation, or loose stools (diarrhea).  HOME CARE INSTRUCTIONS  Increase the amount of fiber in your diet as directed by your caregiver or dietician. This may reduce symptoms of diverticulosis.   Drink at least 6 to 8 glasses of water each day to prevent constipation.   Try not to strain when you have a bowel movement.   Avoiding nuts and seeds to prevent complications is NOT NECESSARY.   FOODS HAVING HIGH FIBER CONTENT  INCLUDE:  Fruits. Apple, peach, pear, tangerine, raisins, prunes.   Vegetables. Brussels sprouts, asparagus, broccoli, cabbage, carrot, cauliflower, romaine lettuce, spinach, summer squash, tomato, winter squash, zucchini.   Starchy Vegetables. Baked beans, kidney beans, lima beans, split peas, lentils, potatoes (with skin).    SEEK IMMEDIATE MEDICAL CARE IF:  You develop increasing pain or severe bloating.   You have an oral temperature above 101F.   You develop vomiting or bowel movements that are bloody or black.

## 2019-01-03 NOTE — Op Note (Addendum)
[facility] Patient Name: Becky [name] Procedure Date: 01/03/2019 7:50 AM MRN: [id number] Date of Birth: [birth year] Attending MD: Barney Drain MD, MD CSN: [id number] Age: 58 Admit Type: Outpatient Procedure:                Colonoscopy WITH COLD SNARE POLYPECTOMY Indications:              Hematochezia Providers:                Barney Drain MD, MD, Charlsie Quest. [name] Sers RN, RN,                            Nelma Rothman, Technician Referring MD:             Redmond School, MD Medicines:                Meperidine 75 mg IV, Midazolam 5 mg IV Complications:            No immediate complications. Estimated Blood Loss:     Estimated blood loss was minimal. Procedure:                Pre-Anesthesia Assessment:                           - Prior to the procedure, a History and Physical                            was performed, and patient medications and                            allergies were reviewed. The patient's tolerance of                            previous anesthesia was also reviewed. The risks                            and benefits of the procedure and the sedation                            options and risks were discussed with the patient.                            All questions were answered, and informed consent                            was obtained. Prior Anticoagulants: The patient has                            taken Xarelto (rivaroxaban), last dose was 3 days                            prior to procedure. ASA Grade Assessment: II - A                            patient with mild systemic disease. After reviewing  the risks and benefits, the patient was deemed in                            satisfactory condition to undergo the procedure.                            After obtaining informed consent, the colonoscope                            was passed under direct vision. Throughout the                            procedure, the patient's blood  pressure, pulse, and                            oxygen saturations were monitored continuously. The                            [id number]-[id number]) scope was introduced through                            the anus and advanced to the 2 cm into the ileum.                            The colonoscopy was somewhat difficult due to a                            tortuous colon. Successful completion of the                            procedure was aided by straightening and shortening                            the scope to obtain bowel loop reduction and                            COLOWRAP. The patient tolerated the procedure well.                            The quality of the bowel preparation was good. The                            terminal ileum, ileocecal valve, appendiceal                            orifice, and rectum were photographed. Scope In: 8:56:27 AM Scope Out: 9:25:40 AM Scope Withdrawal Time: 0 hours 26 minutes 54 seconds  Total Procedure Duration: 0 hours 29 minutes 13 seconds  Findings:      The terminal ileum appeared normal.      Nine sessile polyps were found in the rectum, sigmoid colon(3),       descending colon(3) and mid transverse colon(2). The polyps were 2 to 6       mm in size.  These polyps were removed with a cold snare. Resection and       retrieval were complete.      Multiple small and large-mouthed diverticula were found in the       recto-sigmoid colon, sigmoid colon and descending colon.      Internal hemorrhoids were found. The hemorrhoids were small.      The recto-sigmoid colon and sigmoid colon were mildly tortuous. Impression:               - The examined portion of the ileum was normal.                           - Nine 2 to 6 mm polyps in the rectum, in the                            sigmoid colon, in the descending colon and in the                            mid transverse colon, removed with a cold snare.                            Resected and  retrieved.                           - Diverticulosis in the recto-sigmoid colon, in the                            sigmoid colon and in the descending colon.                           - Internal hemorrhoids.                           - Tortuous colon. Moderate Sedation:      Moderate (conscious) sedation was administered by the endoscopy nurse       and supervised by the endoscopist. The following parameters were       monitored: oxygen saturation, heart rate, blood pressure, and response       to care. Total physician intraservice time was 42 minutes. Recommendation:           - Patient has a contact number available for                            emergencies. The signs and symptoms of potential                            delayed complications were discussed with the                            patient. Return to normal activities tomorrow.                            Written discharge instructions were provided to the  patient.                           - High fiber diet.                           - Continue present medications. RE-START XARELTO                            DEC 29.                           - Await pathology results.                           - Repeat colonoscopy date to be determined after                            pending pathology results are reviewed for                            surveillance.                           - Return to GI office in 6 months. Procedure Code(s):        --- Professional ---                           [phone], Colonoscopy, flexible; with removal of                            tumor(s), polyp(s), or other lesion(s) by snare                            technique                           99153, Moderate sedation; each additional 15                            minutes intraservice time                           99153, Moderate sedation; each additional 15                            minutes intraservice time                            G0500, Moderate sedation services provided by the                            same physician or other qualified health care                            professional performing a gastrointestinal  endoscopic service that sedation supports,                            requiring the presence of an independent trained                            observer to assist in the monitoring of the                            patient's level of consciousness and physiological                            status; initial 15 minutes of intra-service time;                            patient age 75 years or older (additional time may                            be reported with [phone], as appropriate) Diagnosis Code(s):        --- Professional ---                           K64.8, Other hemorrhoids                           K62.1, Rectal polyp                           K63.5, Polyp of colon                           K92.1, Melena (includes Hematochezia)                           K57.30, Diverticulosis of large intestine without                            perforation or abscess without bleeding                           Q43.8, Other specified congenital malformations of                            intestine CPT copyright 2019 [employer]. All rights reserved. The codes documented in this report are preliminary and upon coder review may  be revised to meet current compliance requirements. Barney Drain, MD Barney Drain MD, MD 01/03/2019 9:38:00 AM This report has been signed electronically. Number of Addenda: 0

## 2019-01-04 ENCOUNTER — Telehealth: Payer: Self-pay | Admitting: *Deleted

## 2019-01-04 DIAGNOSIS — M25512 Pain in left shoulder: Secondary | ICD-10-CM | POA: Diagnosis not present

## 2019-01-04 LAB — SURGICAL PATHOLOGY

## 2019-01-04 NOTE — Telephone Encounter (Signed)
Telephoned patient at home number. Patient states still having some discharge that is watery, sticky feel, no odor. Patient used some monistat and that did not help. Did use the Flagyl but not as directed. Wants to know if there is something else she could use or if you would want to see her for a visit.

## 2019-01-04 NOTE — Telephone Encounter (Signed)
Returning our call regarding refill on Medication.

## 2019-01-05 MED ORDER — METRONIDAZOLE 0.75 % VA GEL: 1.0000 | Freq: Every day | VAGINAL | 0 refills | Status: DC

## 2019-01-05 NOTE — Addendum Note (Signed)
Addended by: Derrek Monaco A on: 01/05/2019 08:43 AM   Modules accepted: Orders

## 2019-01-05 NOTE — Telephone Encounter (Signed)
Pr has discharge, has hx BV, will rx metrogel, if not better make appt

## 2019-01-10 DIAGNOSIS — Z4789 Encounter for other orthopedic aftercare: Secondary | ICD-10-CM | POA: Diagnosis not present

## 2019-01-14 ENCOUNTER — Ambulatory Visit (INDEPENDENT_AMBULATORY_CARE_PROVIDER_SITE_OTHER): Payer: BC Managed Care – PPO | Admitting: Adult Health

## 2019-01-14 ENCOUNTER — Encounter: Payer: Self-pay | Admitting: Adult Health

## 2019-01-14 ENCOUNTER — Other Ambulatory Visit: Payer: Self-pay

## 2019-01-14 VITALS — BP 136/76 | HR 74 | Ht 67.0 in | Wt 267.6 lb

## 2019-01-14 DIAGNOSIS — N898 Other specified noninflammatory disorders of vagina: Secondary | ICD-10-CM | POA: Diagnosis not present

## 2019-01-14 DIAGNOSIS — B379 Candidiasis, unspecified: Secondary | ICD-10-CM

## 2019-01-14 LAB — POCT WET PREP (WET MOUNT)
Clue Cells Wet Prep Whiff POC: NEGATIVE
Trichomonas Wet Prep HPF POC: ABSENT

## 2019-01-14 MED ORDER — FLUCONAZOLE 150 MG PO TABS: 150.0000 mg | ORAL_TABLET | Freq: Every day | ORAL | 6 refills | Status: DC

## 2019-01-14 NOTE — Progress Notes (Signed)
  Subjective:     Patient ID: Becky [name], female   DOB: 1960-11-09, 59 y.o.   MRN: [id number]  HPI Becky [name] is a 59 year old black female, married, PM, in complaining of vaginal itching, worse after sex. She is out of work due to shoulder.  PCP is Dr Gerarda Fraction   Review of Systems +vaginal itching, worse after sex Tried OTC PH balance wash  Reviewed past medical,surgical, social and family history. Reviewed medications and allergies.     Objective:   Physical Exam BP 136/76 (BP Location: Left Arm, Patient Position: Sitting, Cuff Size: Normal)   Pulse 74   Ht 5\' 7"  (1.702 m)   Wt 267 lb 9.6 oz (121.4 kg)   BMI 41.91 kg/m   Skin warm and dry.Pelvic: external genitalia is normal in appearance no lesions, vagina: white discharge without odor,urethra has no lesions or masses noted, cervix:smooth and bulbous, uterus: normal size, shape and contour, non tender, no masses felt, adnexa: no masses or tenderness noted. Bladder is non tender and no masses felt. Wet prep: + yeast and +WBCs. Examination chaperoned by Rolena Infante LPN    Assessment:     1. Vaginal itching Use bar soap, no shower gel Have partner pull out   2. Yeast infection Will rx diflucan Meds ordered this encounter  Medications  . fluconazole (DIFLUCAN) 150 MG tablet    Sig: Take 1 tablet (150 mg total) by mouth daily.    Dispense:  2 tablet    Refill:  6    Order Specific Question:   Supervising Provider    Answer:   Florian Buff [[id number]]      Plan:     Follow up prn

## 2019-01-19 ENCOUNTER — Ambulatory Visit: Payer: BC Managed Care – PPO | Admitting: Adult Health

## 2019-02-14 DIAGNOSIS — M25512 Pain in left shoulder: Secondary | ICD-10-CM | POA: Diagnosis not present

## 2019-02-14 DIAGNOSIS — Z4789 Encounter for other orthopedic aftercare: Secondary | ICD-10-CM | POA: Diagnosis not present

## 2019-02-16 IMAGING — DX DG CHEST 2V
2 series · 2 of 2 positions shown · non-contrast
Comparison: 05/22/2015

CLINICAL DATA: Left shoulder pain and nausea.

EXAM:
CHEST  2 VIEW

[chest pa]
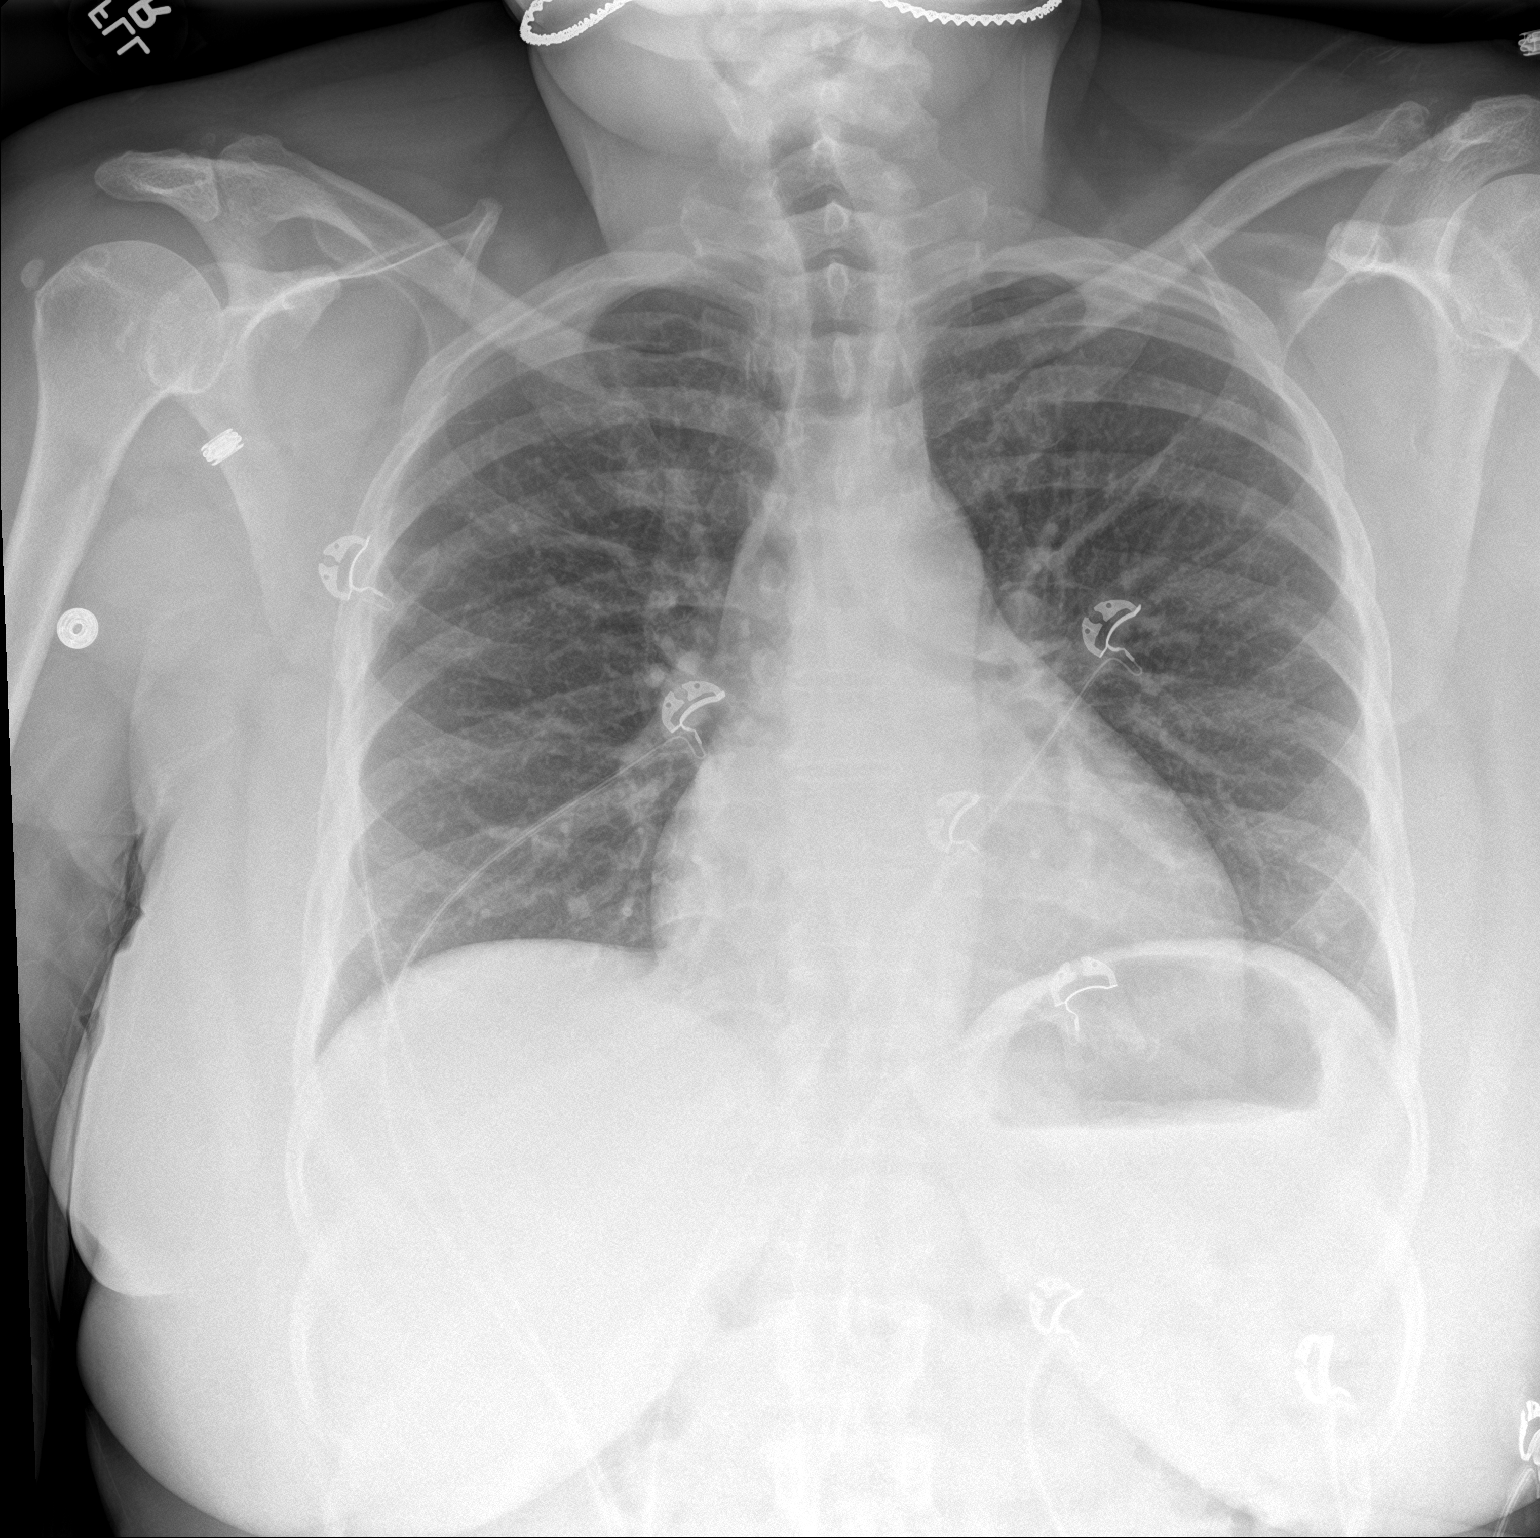

[chest lat]
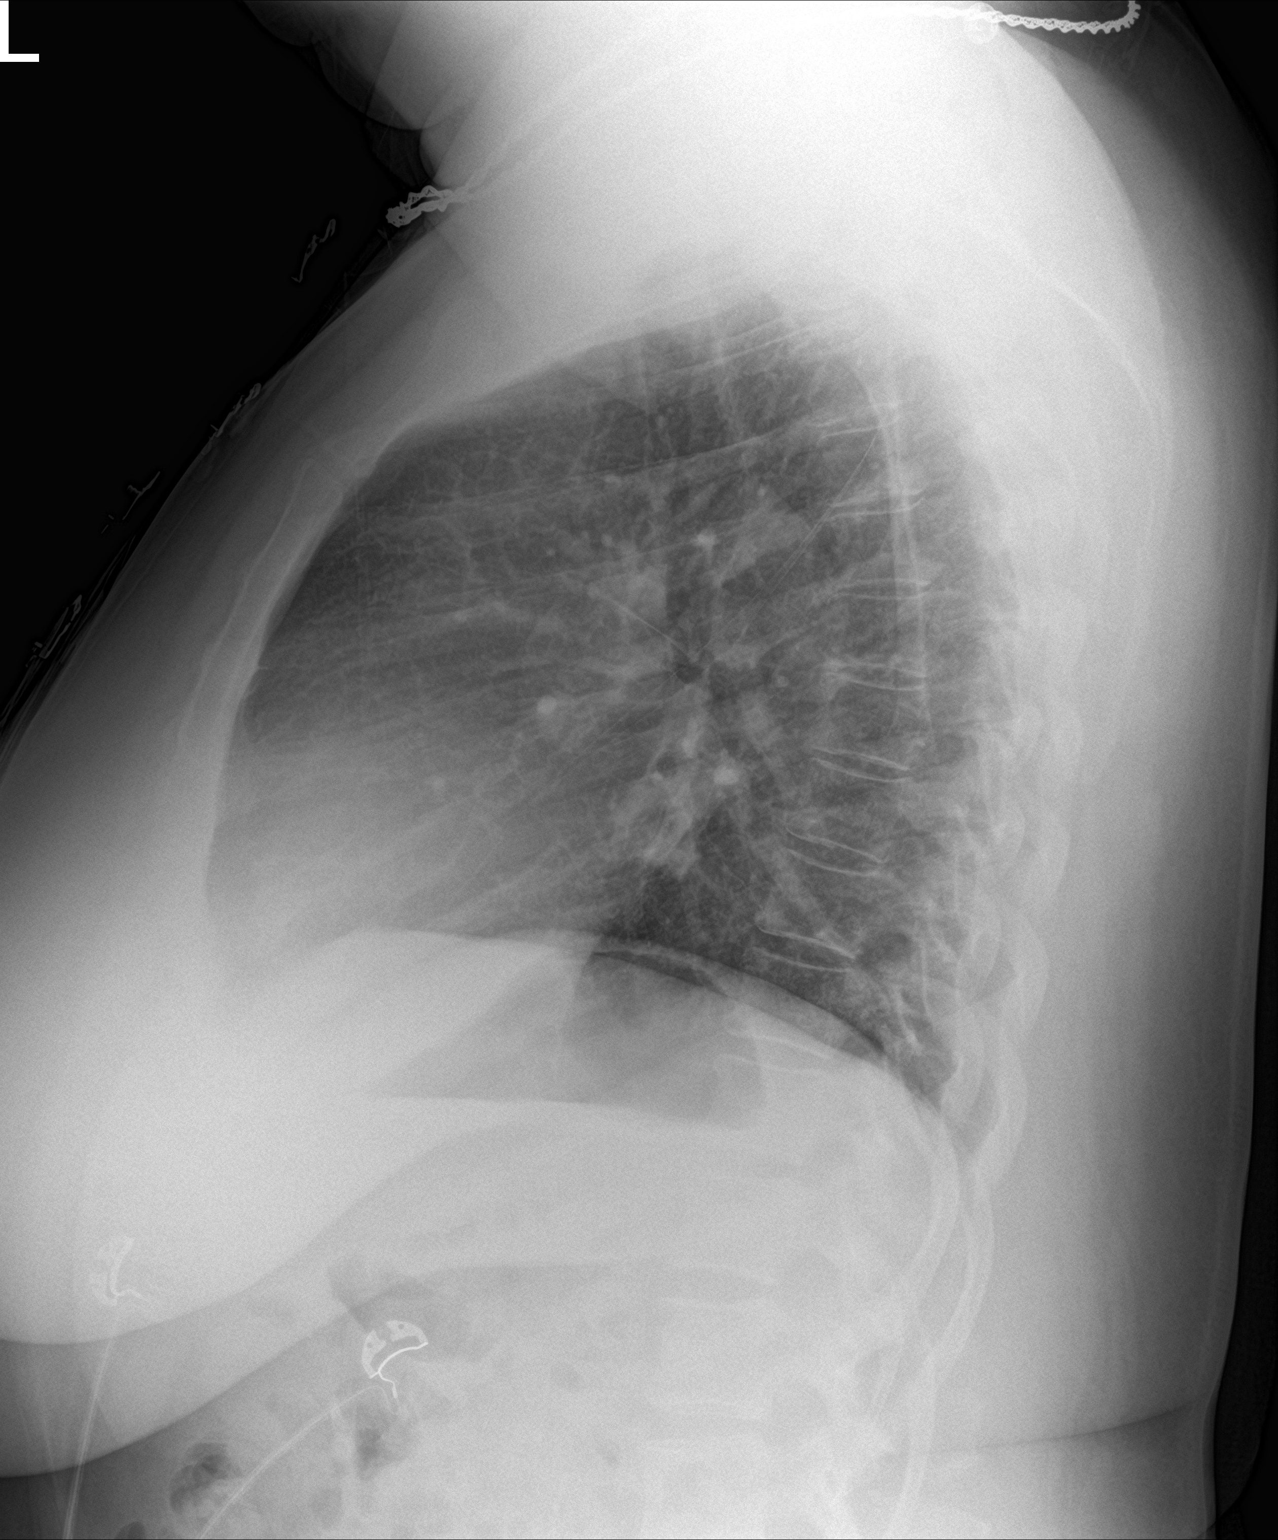

[2 of 2 positions shown; findings below may reference images not displayed]

FINDINGS: The lungs are clear without focal pneumonia, edema, pneumothorax or
pleural effusion. Cardiopericardial silhouette is at upper limits of
normal for size. The visualized bony structures of the thorax are
intact. Telemetry leads overlie the chest.
IMPRESSION: No active cardiopulmonary disease.

## 2019-02-22 ENCOUNTER — Ambulatory Visit: Payer: BC Managed Care – PPO | Admitting: Nurse Practitioner

## 2019-03-04 ENCOUNTER — Encounter: Payer: Self-pay | Admitting: Cardiovascular Disease

## 2019-03-04 ENCOUNTER — Telehealth (INDEPENDENT_AMBULATORY_CARE_PROVIDER_SITE_OTHER): Payer: BC Managed Care – PPO | Admitting: Cardiovascular Disease

## 2019-03-04 VITALS — BP 131/89 | HR 65 | Temp 96.2°F | Ht 67.0 in | Wt 264.8 lb

## 2019-03-04 DIAGNOSIS — Z6841 Body Mass Index (BMI) 40.0 and over, adult: Secondary | ICD-10-CM

## 2019-03-04 DIAGNOSIS — R0600 Dyspnea, unspecified: Secondary | ICD-10-CM

## 2019-03-04 DIAGNOSIS — I480 Paroxysmal atrial fibrillation: Secondary | ICD-10-CM

## 2019-03-04 DIAGNOSIS — G4733 Obstructive sleep apnea (adult) (pediatric): Secondary | ICD-10-CM

## 2019-03-04 DIAGNOSIS — I119 Hypertensive heart disease without heart failure: Secondary | ICD-10-CM | POA: Diagnosis not present

## 2019-03-04 DIAGNOSIS — R0609 Other forms of dyspnea: Secondary | ICD-10-CM

## 2019-03-04 DIAGNOSIS — R9439 Abnormal result of other cardiovascular function study: Secondary | ICD-10-CM | POA: Diagnosis not present

## 2019-03-04 DIAGNOSIS — E669 Obesity, unspecified: Secondary | ICD-10-CM

## 2019-03-04 DIAGNOSIS — I10 Essential (primary) hypertension: Secondary | ICD-10-CM

## 2019-03-04 DIAGNOSIS — E782 Mixed hyperlipidemia: Secondary | ICD-10-CM

## 2019-03-04 DIAGNOSIS — E785 Hyperlipidemia, unspecified: Secondary | ICD-10-CM

## 2019-03-04 NOTE — Patient Instructions (Signed)
Medication Instructions:  Continue all current medications.  Labwork: none  Testing/Procedures: none  Follow-Up: 6 months   Any Other Special Instructions Will Be Listed Below (If Applicable). You have been referred to:  [facility] - their office should call you with the appointment.   If you need a refill on your cardiac medications before your next appointment, please call your pharmacy.

## 2019-03-04 NOTE — Addendum Note (Signed)
Addended by: Laurine Blazer on: 03/04/2019 11:39 AM   Modules accepted: Orders
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
## 2019-03-06 ENCOUNTER — Other Ambulatory Visit: Payer: Self-pay | Admitting: Cardiology

## 2019-03-06 ENCOUNTER — Other Ambulatory Visit: Payer: Self-pay | Admitting: Student

## 2019-03-06 MED ORDER — RIVAROXABAN 20 MG PO TABS: 20.0000 mg | ORAL_TABLET | Freq: Every day | ORAL | 3 refills | Status: DC

## 2019-03-06 NOTE — Progress Notes (Signed)
Sent in refill for xarelto

## 2019-03-16 ENCOUNTER — Other Ambulatory Visit: Payer: Self-pay

## 2019-03-16 ENCOUNTER — Encounter: Payer: Self-pay | Admitting: Neurology

## 2019-03-16 ENCOUNTER — Ambulatory Visit (INDEPENDENT_AMBULATORY_CARE_PROVIDER_SITE_OTHER): Payer: BC Managed Care – PPO | Admitting: Neurology

## 2019-03-16 VITALS — BP 107/74 | HR 52 | Temp 97.2°F | Ht 67.0 in | Wt 261.3 lb

## 2019-03-16 DIAGNOSIS — G4733 Obstructive sleep apnea (adult) (pediatric): Secondary | ICD-10-CM

## 2019-03-16 DIAGNOSIS — Z6841 Body Mass Index (BMI) 40.0 and over, adult: Secondary | ICD-10-CM

## 2019-03-16 DIAGNOSIS — I480 Paroxysmal atrial fibrillation: Secondary | ICD-10-CM

## 2019-03-16 DIAGNOSIS — R351 Nocturia: Secondary | ICD-10-CM | POA: Diagnosis not present

## 2019-03-16 DIAGNOSIS — R519 Headache, unspecified: Secondary | ICD-10-CM

## 2019-03-16 NOTE — Patient Instructions (Signed)
Thank you for choosing [facility] for your sleep related care! It was nice to meet you today! I appreciate that you entrust me with your sleep related healthcare concerns. I hope, I was able to address at least some of your concerns today, and that I can help you feel reassured and also get better.    Here is what we discussed today and what we came up with as our plan for you:    Based on your symptoms and your exam I believe you are still at risk for obstructive sleep apnea and would benefit from reevaluation as it has been a few years and had some weight gain. Therefore, I think we should proceed with a sleep study to determine how severe your sleep apnea is. If you have more than mild OSA, I want you to consider ongoing treatment with CPAP. Please remember, the risks and ramifications of moderate to severe obstructive sleep apnea or OSA are: Cardiovascular disease, including congestive heart failure, stroke, difficult to control hypertension, arrhythmias, and even type 2 diabetes has been linked to untreated OSA. Sleep apnea causes disruption of sleep and sleep deprivation in most cases, which, in turn, can cause recurrent headaches, problems with memory, mood, concentration, focus, and vigilance. Most people with untreated sleep apnea report excessive daytime sleepiness, which can affect their ability to drive. Please do not drive if you feel sleepy.   I will likely see you back after your sleep study to go over the test results and where to go from there. We will call you after your sleep study to advise about the results (most likely, you will hear from [place], my nurse) and to set up an appointment at the time, as necessary.    Our sleep lab administrative assistant will call you to schedule your sleep study. If you don't hear back from her by about 2 weeks from now, please feel free to call her at [phone]. You can leave a message with your phone number and concerns, if you  get the voicemail box. She will call back as soon as possible.
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
## 2019-03-27 ENCOUNTER — Ambulatory Visit
Admission: EM | Admit: 2019-03-27 | Discharge: 2019-03-27 | Disposition: A | Discharge status: Home / Self Care | Payer: BC Managed Care – PPO | Attending: Emergency Medicine | Admitting: Emergency Medicine

## 2019-03-27 ENCOUNTER — Other Ambulatory Visit: Payer: Self-pay

## 2019-03-27 DIAGNOSIS — M25572 Pain in left ankle and joints of left foot: Secondary | ICD-10-CM | POA: Diagnosis not present

## 2019-03-27 DIAGNOSIS — M25562 Pain in left knee: Secondary | ICD-10-CM | POA: Diagnosis not present

## 2019-03-27 MED ORDER — METHYLPREDNISOLONE 16 MG PO TABS: 16.0000 mg | ORAL_TABLET | Freq: Two times a day (BID) | ORAL | 0 refills | Status: AC
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
## 2019-03-27 NOTE — Discharge Instructions (Signed)
Continue conservative management of rest, ice, and elevation Steroid prescribed.  Take as directed and to completion Ankle brace given Begin wearing knee brace Follow up with orthopedist in 1-2 weeks for recheck and further management Return or go to the ER if you have any new or worsening symptoms (fever, chills, redness, swelling, weakness, numbness/ tingling, worsening symptoms despite treatment, etc...)

## 2019-03-27 NOTE — ED Triage Notes (Signed)
Pt presents with c/o of left ankle pain that began yesterday , pt denies injury and states left knee has became sore as well

## 2019-04-07 ENCOUNTER — Other Ambulatory Visit: Payer: Self-pay

## 2019-04-07 ENCOUNTER — Ambulatory Visit (INDEPENDENT_AMBULATORY_CARE_PROVIDER_SITE_OTHER): Payer: BC Managed Care – PPO | Admitting: Neurology

## 2019-04-07 DIAGNOSIS — G4720 Circadian rhythm sleep disorder, unspecified type: Secondary | ICD-10-CM

## 2019-04-07 DIAGNOSIS — R9431 Abnormal electrocardiogram [ECG] [EKG]: Secondary | ICD-10-CM

## 2019-04-07 DIAGNOSIS — R351 Nocturia: Secondary | ICD-10-CM

## 2019-04-07 DIAGNOSIS — G4733 Obstructive sleep apnea (adult) (pediatric): Secondary | ICD-10-CM | POA: Diagnosis not present

## 2019-04-07 DIAGNOSIS — R519 Headache, unspecified: Secondary | ICD-10-CM

## 2019-04-07 DIAGNOSIS — I480 Paroxysmal atrial fibrillation: Secondary | ICD-10-CM

## 2019-04-07 DIAGNOSIS — Z6841 Body Mass Index (BMI) 40.0 and over, adult: Secondary | ICD-10-CM

## 2019-04-21 NOTE — Procedures (Signed)
PATIENT'S NAME:  Becky [name], Becky [name] DOB:      02/28/1960      MR#:    [id number]     DATE OF RECORDING: 04/07/2019 REFERRING M.D.:  Redmond School, MD Study Performed:   Baseline Polysomnogram HISTORY: 59 year old woman with a history of hypertension, hyperlipidemia, hypothyroidism, paroxysmal A. fib, arthritis, back pain, and morbid obesity with a BMI of over 40, who was previously diagnosed with obstructive sleep apnea and placed on CPAP therapy.  She no longer uses CPAP. The patient endorsed the Epworth Sleepiness Scale at 10 points. The patient's weight 261 pounds with a height of 67 (inches), resulting in a BMI of 40.8 kg/m2. The patient's neck circumference measured 17.2 inches.  CURRENT MEDICATIONS: Trulance, Aldactone, Crestor, Xarelto, Oxycodone, Zofran, Benicar, Singuair, Xiidra, Anusol, Refresh eye drops, Epipen, Colace, Dilacor XR, Dexilant, Vitamin D, Zyrtec, Rhinocort aqua, Armour thyroid, Tylenol   PROCEDURE:  This is a multichannel digital polysomnogram utilizing the Somnostar 11.2 system.  Electrodes and sensors were applied and monitored per AASM Specifications.   EEG, EOG, Chin and Limb EMG, were sampled at 200 Hz.  ECG, Snore and Nasal Pressure, Thermal Airflow, Respiratory Effort, CPAP Flow and Pressure, Oximetry was sampled at 50 Hz. Digital video and audio were recorded.      BASELINE STUDY  Lights Out was at 22:22 and Lights On at 05:03.  Total recording time (TRT) was 401.5 minutes, with a total sleep time (TST) of 334.5 minutes.   The patient's sleep latency was 20 minutes.  REM latency was 84 minutes, which is normal. The sleep efficiency was 83.3 %.     SLEEP ARCHITECTURE: WASO (Wake after sleep onset) was 47 minutes with mild to moderate sleep fragmentation noted. There were 18.5 minutes in Stage N1, 218 minutes Stage N2, 35 minutes Stage N3 and 63 minutes in Stage REM.  The percentage of Stage N1 was 5.5%, Stage N2 was 65.2%, which is increased, Stage N3 was 10.5% and  Stage R (REM sleep) was 18.8%, which is normal. The arousals were noted as: 20 were spontaneous, 0 were associated with PLMs, 70 were associated with respiratory events.  RESPIRATORY ANALYSIS:  There were a total of 185 respiratory events:  44 obstructive apneas, 0 central apneas and 0 mixed apneas with a total of 44 apneas and an apnea index (AI) of 7.9 /hour. There were 141 hypopneas with a hypopnea index of 25.3 /hour. The patient also had 0 respiratory event related arousals (RERAs).      The total APNEA/HYPOPNEA INDEX (AHI) was 33.2/hour and the total RESPIRATORY DISTURBANCE INDEX was  33.2 /hour.  51 events occurred in REM sleep and 188 events in NREM. The REM AHI was  48.6 /hour, versus a non-REM AHI of 29.6. The patient spent 105 minutes of total sleep time in the supine position and 230 minutes in non-supine.. The supine AHI was 31.4 versus a non-supine AHI of 34.0.  OXYGEN SATURATION & C02:  The Wake baseline 02 saturation was 94%, with the lowest being 80%. Time spent below 89% saturation equaled 31 minutes. PERIODIC LIMB MOVEMENTS: The patient had a total of 0 Periodic Limb Movements.  The Periodic Limb Movement (PLM) index was 0 and the PLM Arousal index was 0/hour.  Audio and video analysis did not show any abnormal or unusual movements, behaviors, phonations or vocalizations. The patient took 2 bathroom breaks. Mild snoring was noted. The EKG was in keeping with normal sinus rhythm (NSR) with rare PVCs noted.  Post-study, the patient indicated  that sleep was the same as usual.   IMPRESSION:  1. Obstructive Sleep Apnea (OSA) 2. Dysfunctions associated with sleep stages or arousal from sleep 3. Non-specific abnormal EKG  RECOMMENDATIONS:  1. This study demonstrates severe obstructive sleep apnea, with a total AHI of 33.2/hour, REM AHI of 48.6/hour, supine AHI of 31.4/hour and O2 nadir of 80%. Treatment with positive airway pressure in the form of CPAP is recommended. This will  require a full night titration study to optimize therapy. Other treatment options may include avoidance of supine sleep position along with weight loss, upper airway or jaw surgery in selected patients or the use of an oral appliance in certain patients. ENT evaluation and/or consultation with a maxillofacial surgeon or dentist may be feasible in some instances.    2. Please note that untreated obstructive sleep apnea may carry additional perioperative morbidity. Patients with significant obstructive sleep apnea should receive perioperative PAP therapy and the surgeons and particularly the anesthesiologist should be informed of the diagnosis and the severity of the sleep disordered breathing. 3. This study shows sleep fragmentation and abnormal sleep stage percentages; these are nonspecific findings and per se do not signify an intrinsic sleep disorder or a cause for the patient's sleep-related symptoms. Causes include (but are not limited to) the first night effect of the sleep study, circadian rhythm disturbances, medication effect or an underlying mood disorder or medical problem.  4. The patient should be cautioned not to drive, work at heights, or operate dangerous or heavy equipment when tired or sleepy. Review and reiteration of good sleep hygiene measures should be pursued with any patient. 5. The study showed rare PVCs on single lead EKG; clinical correlation is recommended. 6. The patient will be seen in follow-up in the sleep clinic at [facility] for discussion of the test results, symptom and treatment compliance review, further management strategies, etc. The referring provider will be notified of the test results.  I certify that I have reviewed the entire raw data recording prior to the issuance of this report in accordance with the Standards of Accreditation of the American Academy of Sleep Medicine (AASM)  Star Age, MD, PhD Diplomat, American Board of Neurology and Sleep Medicine (Neurology  and Sleep Medicine)

## 2019-04-22 NOTE — Addendum Note (Signed)
Addended by: Star Age on: 04/22/2019 01:17 PM   Modules accepted: Orders

## 2019-04-22 NOTE — Progress Notes (Signed)
Patient referred by Dr. Bronson Ing, seen by me on 03/16/19, diagnostic PSG on 04/07/19.   Please call and notify the patient that the recent sleep study showed severe obstructive sleep apnea. I recommend treatment for this in the form of CPAP. This will require a repeat sleep study for proper titration and mask fitting and correct monitoring of the oxygen saturations. Please explain to patient. I have placed an order in the chart. Thanks.  Star Age, MD, PhD [facility])

## 2019-04-25 ENCOUNTER — Telehealth: Payer: Self-pay

## 2019-04-25 NOTE — Telephone Encounter (Signed)
I called pt to discuss. No answer, left a message asking her to call me back. 

## 2019-04-25 NOTE — Telephone Encounter (Signed)
-----   Message from Star Age, MD sent at 04/22/2019  1:17 PM EDT ----- Patient referred by Dr. Bronson Ing, seen by me on 03/16/19, diagnostic PSG on 04/07/19.   Please call and notify the patient that the recent sleep study showed severe obstructive sleep apnea. I recommend treatment for this in the form of CPAP. This will require a repeat sleep study for proper titration and mask fitting and correct monitoring of the oxygen saturations. Please explain to patient. I have placed an order in the chart. Thanks.  Star Age, MD, PhD [facility])

## 2019-04-26 NOTE — Telephone Encounter (Signed)
I called pt. I advised pt that Dr. Athar reviewed their sleep study results and found that pt has severe osa and recommends that pt be treated with a cpap. Dr. Athar recommends that pt return for a repeat sleep study in order to properly titrate the cpap and ensure a good mask fit. Pt is agreeable to returning for a titration study. I advised pt that our sleep lab will file with pt's insurance and call pt to schedule the sleep study when we hear back from the pt's insurance regarding coverage of this sleep study. Pt verbalized understanding of results. Pt had no questions at this time but was encouraged to call back if questions arise.   

## 2019-04-28 DIAGNOSIS — J019 Acute sinusitis, unspecified: Secondary | ICD-10-CM | POA: Diagnosis not present

## 2019-04-28 DIAGNOSIS — E6609 Other obesity due to excess calories: Secondary | ICD-10-CM | POA: Diagnosis not present

## 2019-04-28 DIAGNOSIS — Z6836 Body mass index (BMI) 36.0-36.9, adult: Secondary | ICD-10-CM | POA: Diagnosis not present

## 2019-04-28 DIAGNOSIS — H6993 Unspecified Eustachian tube disorder, bilateral: Secondary | ICD-10-CM | POA: Diagnosis not present
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
## 2019-05-12 ENCOUNTER — Other Ambulatory Visit: Payer: Self-pay

## 2019-05-12 ENCOUNTER — Encounter: Payer: Self-pay | Admitting: Gastroenterology

## 2019-05-12 ENCOUNTER — Ambulatory Visit (INDEPENDENT_AMBULATORY_CARE_PROVIDER_SITE_OTHER): Payer: BC Managed Care – PPO | Admitting: Gastroenterology

## 2019-05-12 VITALS — BP 101/73 | HR 70 | Temp 97.0°F | Ht 67.0 in | Wt 261.4 lb

## 2019-05-12 DIAGNOSIS — K219 Gastro-esophageal reflux disease without esophagitis: Secondary | ICD-10-CM | POA: Diagnosis not present

## 2019-05-12 DIAGNOSIS — K5900 Constipation, unspecified: Secondary | ICD-10-CM | POA: Diagnosis not present

## 2019-05-12 MED ORDER — LINACLOTIDE 72 MCG PO CAPS: 72.0000 ug | ORAL_CAPSULE | Freq: Every day | ORAL | 5 refills | Status: DC

## 2019-05-12 NOTE — Patient Instructions (Signed)
I have sent in [place] to see if this is covered now. We will try to help with this!  Continue Dexilant daily.  Call if any bleeding, nausea, vomiting, abdominal pain, or problems swallowing.  We will see you in 1 year or sooner if needed!  I enjoyed seeing you again today! As you know, I value our relationship and want to provide genuine, compassionate, and quality care. I welcome your feedback. If you receive a survey regarding your visit,  I greatly appreciate you taking time to fill this out. See you next time!  [name], PhD, ANP-BC [facility]

## 2019-05-14 ENCOUNTER — Other Ambulatory Visit (HOSPITAL_COMMUNITY)
Admission: RE | Admit: 2019-05-14 | Discharge: 2019-05-14 | Disposition: A | Discharge status: Home / Self Care | Payer: BC Managed Care – PPO | Source: Ambulatory Visit | Attending: Neurology | Admitting: Neurology

## 2019-05-14 DIAGNOSIS — Z20822 Contact with and (suspected) exposure to covid-19: Secondary | ICD-10-CM | POA: Diagnosis not present

## 2019-05-14 DIAGNOSIS — Z01812 Encounter for preprocedural laboratory examination: Secondary | ICD-10-CM | POA: Diagnosis not present

## 2019-05-14 LAB — SARS CORONAVIRUS 2 (TAT 6-24 HRS): SARS Coronavirus 2: NEGATIVE

## 2019-05-15 ENCOUNTER — Ambulatory Visit (INDEPENDENT_AMBULATORY_CARE_PROVIDER_SITE_OTHER): Payer: BC Managed Care – PPO | Admitting: Neurology

## 2019-05-15 DIAGNOSIS — R351 Nocturia: Secondary | ICD-10-CM

## 2019-05-15 DIAGNOSIS — R9431 Abnormal electrocardiogram [ECG] [EKG]: Secondary | ICD-10-CM

## 2019-05-15 DIAGNOSIS — I480 Paroxysmal atrial fibrillation: Secondary | ICD-10-CM

## 2019-05-15 DIAGNOSIS — R519 Headache, unspecified: Secondary | ICD-10-CM

## 2019-05-15 DIAGNOSIS — G4733 Obstructive sleep apnea (adult) (pediatric): Secondary | ICD-10-CM | POA: Diagnosis not present

## 2019-05-15 DIAGNOSIS — Z6841 Body Mass Index (BMI) 40.0 and over, adult: Secondary | ICD-10-CM

## 2019-05-15 DIAGNOSIS — G4720 Circadian rhythm sleep disorder, unspecified type: Secondary | ICD-10-CM

## 2019-05-19 DIAGNOSIS — Z0189 Encounter for other specified special examinations: Secondary | ICD-10-CM | POA: Diagnosis not present

## 2019-05-30 ENCOUNTER — Telehealth: Payer: Self-pay

## 2019-05-30 NOTE — Addendum Note (Signed)
Addended by: Star Age on: 05/30/2019 07:29 AM   Modules accepted: Orders

## 2019-05-30 NOTE — Procedures (Signed)
S PATIENT'S NAME:  Becky [name], Becky [name] DOB:      December 24, 1960      MR#:    [id number]     DATE OF RECORDING: 05/15/2019 REFERRING M.D.:  Dr. Court Joy Study Performed:   CPAP  Titration HISTORY: 59 year old woman with a history of hypertension, hyperlipidemia, hypothyroidism, paroxysmal A. fib, arthritis, back pain, and morbid obesity with a BMI of over 40, who presents for a full night titration study. Her baseline sleep study from 04/07/19 showed a total AHI of 33.2/hour, and low spo2 of 80%. The patient endorsed the Epworth Sleepiness Scale at 10 points. The patient's weight 260 pounds with a height of 67 (inches), resulting in a BMI of 40.8 kg/m2. The patient's neck circumference measured 17 inches.  CURRENT MEDICATIONS: Trulance, Aldactone, Crestor, Xarelto, Oxycodone, Zofran, Benicar, Singuair, Xiidra, Anusol, Refresh eye drops, Epipen, Colace, Dilacor XR, Dexilant, Vitamin D, Zyrtec, Rhinocort aqua, Armour thyroid, Tylenol   PROCEDURE:  This is a multichannel digital polysomnogram utilizing the SomnoStar 11.2 system.  Electrodes and sensors were applied and monitored per AASM Specifications.   EEG, EOG, Chin and Limb EMG, were sampled at 200 Hz.  ECG, Snore and Nasal Pressure, Thermal Airflow, Respiratory Effort, CPAP Flow and Pressure, Oximetry was sampled at 50 Hz. Digital video and audio were recorded.      The patient was fitted with a SW F30i mask. CPAP was initiated at 5 cmH20 with heated humidity per AASM standards and pressure was advanced to 13 cm H20 because of hypopneas, apneas and desaturations.  At a PAP pressure of 11 cmH20, there was a reduction of the AHI to 5.6/hour with non-supine REM sleep achieved and O2 nadir of 87%, but there were several instances of dislodged O2 sensor.   Lights Out was at 22:10 and Lights On at 04:59. Total recording time (TRT) was 410 minutes, with a total sleep time (TST) of 340.5 minutes. The patient's sleep latency was 10.5 minutes. REM latency was 50  minutes, which is mildly reduced. The sleep efficiency was 83%.    SLEEP ARCHITECTURE: WASO (Wake after sleep onset) was 64 minutes with mild to moderate sleep fragmentation noted. There were 35.5 minutes in Stage N1, 142.5 minutes Stage N2, 91.5 minutes Stage N3 and 71 minutes in Stage REM.  The percentage of Stage N1 was 10.4%, which is increased, Stage N2 was 41.9%, Stage N3 was 26.9% and Stage R (REM sleep) was 20.9%. The arousals were noted as: 67 were spontaneous, 0 were associated with PLMs, 9 were associated with respiratory events.  RESPIRATORY ANALYSIS:  There was a total of 28 respiratory events: 1 obstructive apneas, 1 central apneas and 0 mixed apneas with a total of 2 apneas and an apnea index (AI) of .4 /hour. There were 26 hypopneas with a hypopnea index of 4.6/hour. The patient also had 0 respiratory event related arousals (RERAs).      The total APNEA/HYPOPNEA INDEX  (AHI) was 4.9 /hour and the total RESPIRATORY DISTURBANCE INDEX was 4.9 /hour  0 events occurred in REM sleep and 28 events in NREM. The REM AHI was 0 /hour versus a non-REM AHI of 6.2 /hour.  The patient spent 111.5 minutes of total sleep time in the supine position and 229 minutes in non-supine. The supine AHI was 10.8, versus a non-supine AHI of 2.1.  OXYGEN SATURATION & C02:  The baseline 02 saturation was 94%, with the lowest being 84%. Time spent below 89% saturation equaled 10 minutes.  PERIODIC LIMB MOVEMENTS:  The  patient had a total of 0 Periodic Limb Movements. The Periodic Limb Movement (PLM) index was 0 and the PLM Arousal index was 0 /hour.  Audio and video analysis did not show any abnormal or unusual movements, behaviors, phonations or vocalizations. The patient took one bathroom break. The EKG was in keeping with normal sinus rhythm (NSR) with PVCs noted.  Post-study, the patient indicated that sleep was better than usual.   IMPRESSION: 1. Obstructive Sleep Apnea (OSA) 2. Dysfunctions associated with  sleep stages or arousal from sleep 3. Non-specific abnormal EKG   RECOMMENDATIONS: 1. This study demonstrates significant improvement of the patient's obstructive sleep apnea with CPAP therapy. I will, therefore, start the patient on home CPAP treatment at a pressure of 11 cm via SW F30i FFM with (heated) humidity. The patient should be reminded to be fully compliant with PAP therapy to improve sleep related symptoms and decrease long term cardiovascular risks. The patient should be reminded, that it may take up to 3 months to get fully used to using PAP with all planned sleep. The earlier full compliance is achieved, the better long term compliance tends to be. Please note that untreated obstructive sleep apnea may carry additional perioperative morbidity. Patients with significant obstructive sleep apnea should receive perioperative PAP therapy and the surgeons and particularly the anesthesiologist should be informed of the diagnosis and the severity of the sleep disordered breathing. 2. This study shows sleep fragmentation and abnormal sleep stage percentages; these are nonspecific findings and per se do not signify an intrinsic sleep disorder or a cause for the patient's sleep-related symptoms. Causes include (but are not limited to) the first night effect of the sleep study, circadian rhythm disturbances, medication effect or an underlying mood disorder or medical problem.  3. The patient should be cautioned not to drive, work at heights, or operate dangerous or heavy equipment when tired or sleepy. Review and reiteration of good sleep hygiene measures should be pursued with any patient. 4. The study showed PVCs on single lead EKG; clinical correlation is recommended. 5. The patient will be seen in follow-up in the sleep clinic at [facility] for discussion of the test results, symptom and treatment compliance review, further management strategies, etc. The referring provider will be notified of the test  results.   I certify that I have reviewed the entire raw data recording prior to the issuance of this report in accordance with the Standards of Accreditation of the American Academy of Sleep Medicine (AASM)   Star Age, MD, PhD Diplomat, American Board of Neurology and Sleep Medicine (Neurology and Sleep Medicine)

## 2019-05-30 NOTE — Telephone Encounter (Signed)
-----   Message from Star Age, MD sent at 05/30/2019  7:29 AM EDT ----- Patient referred by Dr. Bronson Ing, seen by me on 03/16/19, diagnostic PSG on 04/07/19.  Patient had a CPAP titration study on 05/15/19.  Please call and inform patient that I have entered an order for treatment with positive airway pressure (PAP) treatment for obstructive sleep apnea (OSA). She did fairly well during the latest sleep study with CPAP. We will, therefore, arrange for a machine for home use through a DME (durable medical equipment) company of Her choice; and I will see the patient back in follow-up in about 10 weeks. Please also explain to the patient that I will be looking out for compliance data, which can be downloaded from the machine (stored on an SD card, that is inserted in the machine) or via remote access through a modem, that is built into the machine. At the time of the followup appointment we will discuss sleep study results and how it is going with PAP treatment at home. Please advise patient to bring Her machine at the time of the first FU visit, even though this is cumbersome. Bringing the machine for every visit after that will likely not be needed, but often helps for the first visit to troubleshoot if needed. Please re-enforce the importance of compliance with treatment and the need for [place] to monitor compliance data - often an insurance requirement and actually good feedback for the patient as far as how they are doing.  Also remind patient, that any interim PAP machine or mask issues should be first addressed with the DME company, as they can often help better with technical and mask fit issues. Please ask if patient has a preference regarding DME company.  Please also make sure, the patient has a follow-up appointment with me in about 10 weeks from the setup date, thanks. May see one of our nurse practitioners if needed for proper timing of the FU appointment.  Please fax or rout report to the referring  provider. Thanks,   Star Age, MD, PhD [facility])

## 2019-05-30 NOTE — Progress Notes (Signed)
Patient referred by Dr. Bronson Ing, seen by me on 03/16/19, diagnostic PSG on 04/07/19.  Patient had a CPAP titration study on 05/15/19.  Please call and inform patient that I have entered an order for treatment with positive airway pressure (PAP) treatment for obstructive sleep apnea (OSA). She did fairly well during the latest sleep study with CPAP. We will, therefore, arrange for a machine for home use through a DME (durable medical equipment) company of Her choice; and I will see the patient back in follow-up in about 10 weeks. Please also explain to the patient that I will be looking out for compliance data, which can be downloaded from the machine (stored on an SD card, that is inserted in the machine) or via remote access through a modem, that is built into the machine. At the time of the followup appointment we will discuss sleep study results and how it is going with PAP treatment at home. Please advise patient to bring Her machine at the time of the first FU visit, even though this is cumbersome. Bringing the machine for every visit after that will likely not be needed, but often helps for the first visit to troubleshoot if needed. Please re-enforce the importance of compliance with treatment and the need for [place] to monitor compliance data - often an insurance requirement and actually good feedback for the patient as far as how they are doing.  Also remind patient, that any interim PAP machine or mask issues should be first addressed with the DME company, as they can often help better with technical and mask fit issues. Please ask if patient has a preference regarding DME company.  Please also make sure, the patient has a follow-up appointment with me in about 10 weeks from the setup date, thanks. May see one of our nurse practitioners if needed for proper timing of the FU appointment.  Please fax or rout report to the referring provider. Thanks,   Star Age, MD, PhD [facility]  [facility])

## 2019-05-30 NOTE — Telephone Encounter (Signed)
I called Becky [name]. I advised Becky [name] that Dr. Rexene Alberts reviewed their sleep study results and found that Becky [name] did well during her titration at a pressure of 11 on cpap. Dr. Rexene Alberts recommends that Becky [name] start cpap therapy in the home. I reviewed PAP compliance expectations with the Becky [name]. Becky [name] is agreeable to starting a CPAP. I advised Becky [name] that an order will be sent to a DME, [employer], and [employer] will call the Becky [name] within about one week after they file with the Becky [name]'s insurance. [employer] will show the Becky [name] how to use the machine, fit for masks, and troubleshoot the CPAP if needed. A follow up appt was made for insurance purposes with Dr. Rexene Alberts on 08/04/2019 at 930 am. Becky [name] verbalized understanding to arrive 15 minutes early and bring their CPAP. A letter with all of this information in it will be mailed to the Becky [name] as a reminder. I verified with the Becky [name] that the address we have on file is correct. Becky [name] verbalized understanding of results. Becky [name] had no questions at this time but was encouraged to call back if questions arise. I have sent the order to [employer] and have received confirmation that they have received the order.

## 2019-06-10 ENCOUNTER — Emergency Department (HOSPITAL_COMMUNITY): Payer: BC Managed Care – PPO

## 2019-06-10 ENCOUNTER — Encounter (HOSPITAL_COMMUNITY): Payer: Self-pay | Admitting: Emergency Medicine

## 2019-06-10 ENCOUNTER — Other Ambulatory Visit: Payer: Self-pay

## 2019-06-10 ENCOUNTER — Observation Stay (HOSPITAL_COMMUNITY)
Admission: EM | Admit: 2019-06-10 | Discharge: 2019-06-10 | Disposition: A | Discharge status: Home / Self Care | Payer: BC Managed Care – PPO | Attending: Family Medicine | Admitting: Family Medicine

## 2019-06-10 DIAGNOSIS — E7800 Pure hypercholesterolemia, unspecified: Secondary | ICD-10-CM | POA: Insufficient documentation

## 2019-06-10 DIAGNOSIS — K219 Gastro-esophageal reflux disease without esophagitis: Secondary | ICD-10-CM | POA: Insufficient documentation

## 2019-06-10 DIAGNOSIS — Z79890 Hormone replacement therapy (postmenopausal): Secondary | ICD-10-CM | POA: Diagnosis not present

## 2019-06-10 DIAGNOSIS — Z7901 Long term (current) use of anticoagulants: Secondary | ICD-10-CM | POA: Diagnosis not present

## 2019-06-10 DIAGNOSIS — M1990 Unspecified osteoarthritis, unspecified site: Secondary | ICD-10-CM | POA: Insufficient documentation

## 2019-06-10 DIAGNOSIS — I2510 Atherosclerotic heart disease of native coronary artery without angina pectoris: Secondary | ICD-10-CM | POA: Diagnosis not present

## 2019-06-10 DIAGNOSIS — E785 Hyperlipidemia, unspecified: Secondary | ICD-10-CM | POA: Insufficient documentation

## 2019-06-10 DIAGNOSIS — Z87891 Personal history of nicotine dependence: Secondary | ICD-10-CM | POA: Diagnosis not present

## 2019-06-10 DIAGNOSIS — R7989 Other specified abnormal findings of blood chemistry: Secondary | ICD-10-CM | POA: Diagnosis not present

## 2019-06-10 DIAGNOSIS — G4733 Obstructive sleep apnea (adult) (pediatric): Secondary | ICD-10-CM | POA: Diagnosis not present

## 2019-06-10 DIAGNOSIS — I480 Paroxysmal atrial fibrillation: Secondary | ICD-10-CM | POA: Diagnosis not present

## 2019-06-10 DIAGNOSIS — Z79899 Other long term (current) drug therapy: Secondary | ICD-10-CM | POA: Diagnosis not present

## 2019-06-10 DIAGNOSIS — E119 Type 2 diabetes mellitus without complications: Secondary | ICD-10-CM | POA: Insufficient documentation

## 2019-06-10 DIAGNOSIS — Z833 Family history of diabetes mellitus: Secondary | ICD-10-CM | POA: Insufficient documentation

## 2019-06-10 DIAGNOSIS — E039 Hypothyroidism, unspecified: Secondary | ICD-10-CM | POA: Insufficient documentation

## 2019-06-10 DIAGNOSIS — Z20822 Contact with and (suspected) exposure to covid-19: Secondary | ICD-10-CM | POA: Diagnosis not present

## 2019-06-10 DIAGNOSIS — I10 Essential (primary) hypertension: Secondary | ICD-10-CM | POA: Insufficient documentation

## 2019-06-10 DIAGNOSIS — I214 Non-ST elevation (NSTEMI) myocardial infarction: Secondary | ICD-10-CM | POA: Diagnosis not present

## 2019-06-10 DIAGNOSIS — R0602 Shortness of breath: Secondary | ICD-10-CM | POA: Diagnosis not present

## 2019-06-10 DIAGNOSIS — I4891 Unspecified atrial fibrillation: Secondary | ICD-10-CM

## 2019-06-10 DIAGNOSIS — R079 Chest pain, unspecified: Secondary | ICD-10-CM | POA: Diagnosis not present

## 2019-06-10 DIAGNOSIS — I482 Chronic atrial fibrillation, unspecified: Secondary | ICD-10-CM | POA: Diagnosis present

## 2019-06-10 DIAGNOSIS — Z8249 Family history of ischemic heart disease and other diseases of the circulatory system: Secondary | ICD-10-CM | POA: Diagnosis not present

## 2019-06-10 DIAGNOSIS — Z8709 Personal history of other diseases of the respiratory system: Secondary | ICD-10-CM | POA: Insufficient documentation

## 2019-06-10 DIAGNOSIS — Z888 Allergy status to other drugs, medicaments and biological substances status: Secondary | ICD-10-CM | POA: Diagnosis not present

## 2019-06-10 DIAGNOSIS — R002 Palpitations: Secondary | ICD-10-CM | POA: Diagnosis present

## 2019-06-10 LAB — MRSA PCR SCREENING: MRSA by PCR: NEGATIVE

## 2019-06-10 LAB — CBC
HCT: 39.9 % (ref 36.0–46.0)
Hemoglobin: 13.1 g/dL (ref 12.0–15.0)
MCH: 30.5 pg (ref 26.0–34.0)
MCHC: 32.8 g/dL (ref 30.0–36.0)
MCV: 92.8 fL (ref 80.0–100.0)
Platelets: 378 10*3/uL (ref 150–400)
RBC: 4.3 MIL/uL (ref 3.87–5.11)
RDW: 12.8 % (ref 11.5–15.5)
WBC: 9.3 10*3/uL (ref 4.0–10.5)
nRBC: 0 % (ref 0.0–0.2)

## 2019-06-10 LAB — BASIC METABOLIC PANEL
Anion gap: 8 (ref 5–15)
BUN: 14 mg/dL (ref 6–20)
CO2: 26 mmol/L (ref 22–32)
Calcium: 9.9 mg/dL (ref 8.9–10.3)
Chloride: 103 mmol/L (ref 98–111)
Creatinine, Ser: 0.92 mg/dL (ref 0.44–1.00)
GFR calc Af Amer: >60 mL/min (ref >60)
GFR calc non Af Amer: >60 mL/min (ref >60)
Glucose, Bld: 121 mg/dL — ABNORMAL HIGH (ref 70–99)
Potassium: 3.6 mmol/L (ref 3.5–5.1)
Sodium: 137 mmol/L (ref 135–145)

## 2019-06-10 LAB — T4, FREE: Free T4: 0.81 ng/dL (ref 0.61–1.12)

## 2019-06-10 LAB — TROPONIN I (HIGH SENSITIVITY)
Troponin I (High Sensitivity): 334 ng/L (ref <18)
Troponin I (High Sensitivity): 258 ng/L (ref <18)

## 2019-06-10 LAB — SARS CORONAVIRUS 2 BY RT PCR (HOSPITAL ORDER, PERFORMED IN ~~LOC~~ HOSPITAL LAB): SARS Coronavirus 2: NEGATIVE

## 2019-06-10 LAB — TSH: TSH: 1.744 u[IU]/mL (ref 0.350–4.500)

## 2019-06-10 MED ORDER — METOPROLOL TARTRATE 25 MG PO TABS
25.0000 mg | ORAL_TABLET | Freq: Two times a day (BID) | ORAL | 1 refills | Status: DC | PRN
Start: 2019-06-10 — End: 2019-09-06

## 2019-06-10 MED ORDER — DILTIAZEM HCL ER 60 MG PO CP12
60.0000 mg | ORAL_CAPSULE | Freq: Once | ORAL | Status: AC
  Administered 2019-06-10: 60 mg via ORAL
  Filled 2019-06-10 (×2): qty 1

## 2019-06-10 MED ORDER — ONDANSETRON HCL 4 MG/2ML IJ SOLN
4.0000 mg | Freq: Once | INTRAMUSCULAR | Status: AC
  Administered 2019-06-10: 4 mg via INTRAVENOUS
  Filled 2019-06-10: qty 2

## 2019-06-10 MED ORDER — DILTIAZEM HCL ER COATED BEADS 240 MG PO CP24
240.0000 mg | ORAL_CAPSULE | Freq: Every day | ORAL | Status: DC
  Administered 2019-06-10: 240 mg via ORAL
  Filled 2019-06-10 (×5): qty 1

## 2019-06-10 MED ORDER — DILTIAZEM HCL ER COATED BEADS 300 MG PO CP24: 300.0000 mg | ORAL_CAPSULE | Freq: Every day | ORAL | Status: DC

## 2019-06-10 MED ORDER — DILTIAZEM HCL ER COATED BEADS 300 MG PO CP24
300.0000 mg | ORAL_CAPSULE | Freq: Every day | ORAL | 11 refills | Status: DC
Start: 2019-06-10 — End: 2019-09-19

## 2019-06-10 MED ORDER — DILTIAZEM HCL-DEXTROSE 125-5 MG/125ML-% IV SOLN (PREMIX)
5.0000 mg/h | INTRAVENOUS | Status: DC
  Administered 2019-06-10: 5 mg/h via INTRAVENOUS
  Filled 2019-06-10: qty 125

## 2019-06-10 MED ORDER — CHLORHEXIDINE GLUCONATE CLOTH 2 % EX PADS
6.0000 | MEDICATED_PAD | Freq: Every day | CUTANEOUS | Status: DC
  Administered 2019-06-10: 6 via TOPICAL

## 2019-06-10 MED ORDER — HYDROCODONE-ACETAMINOPHEN 5-325 MG PO TABS
1.0000 | ORAL_TABLET | Freq: Once | ORAL | Status: AC
  Administered 2019-06-10: 1 via ORAL
  Filled 2019-06-10: qty 1

## 2019-06-10 NOTE — ED Notes (Signed)
Patient given snacks and soda. Patient stated that IV was bothering her. Patient's IV was check and flushed. IV is working properly with no infiltration.

## 2019-06-10 NOTE — Progress Notes (Signed)
Patient discussed with primary team Dr Loleta Books, admitted with afib with RVR and mild troponin elevation. I would think her trop elevation is demand ischemia in setting of afib with RVR, its trending down. 10/2018 stress test was low risk, if symptoms improved with rate control would not pursue any repeat ischemic testing. For afib would recommend increasing her long acting dilt to 300mg  daily, can also add lopressor 25mg  q8hrs prn palpitations. If rates controlled <110 on oral regimen would be reasonable for discharge today,we will arrange outpatient f/u   Zandra Abts MD

## 2019-06-10 NOTE — ED Triage Notes (Signed)
Pt states that she has been having palpitations since this afternoon. Hx of Afib, HTN.

## 2019-06-10 NOTE — Progress Notes (Signed)
Patient ambulated around unit with no difficulty. VSS during ambulation. HR got as high at 88 during ambulation. MD made aware.

## 2019-06-10 NOTE — Discharge Summary (Signed)
Physician Discharge Summary  Becky [name] RKY:[id number] DOB: May 15, 1960 DOA: 06/10/2019  PCP: Redmond School, MD  Admit date: 06/10/2019 Discharge date: 06/10/2019  Admitted From: Home  Disposition:  Home   Recommendations for Outpatient Follow-up:  1. Follow up with Rosaria Ferries in 1 week 2. Ms [name]: Please review BP and restart or stop olmesartan as needed 3. Please follow up on the following pending results: free T4 and T3  Home Health: None  Equipment/Devices: None  Discharge Condition: Good  CODE STATUS: FULL Diet recommendation: Regular  Brief/Interim Summary: Becky [name] is a 59 y.o. F with hx HTN and pAF on Xarelto and OSA not on CPAP who presents with few hours of sudden onset chest tightness, palpitations.  Patient was in her usual state of health until the afternoon prior to admission when she was at home, not exerting herself in any particular way and developed palpitations and chest tightness.  In the ER, HR 140s, in Afib.  Troponin low and flat.  Electrolytes and renal function normal.  Chest x-ray clear.     PRINCIPAL HOSPITAL DIAGNOSIS: Atrial fibrillation with RVR    Discharge Diagnoses:   Paroxysmal atrial fibrillation with rapid rate The patient was started on a diltiazem drip in the ER and remained in A. fib, but was easily controlled.    The diltiazem drip was stopped and the patient was restarted on oral diltiazem in the morning, dose increased to 300 mg daily.   Discharge with new dose of diltiazem, close cardiology follow-up, and as needed metoprolol low-dose if needed.   Elevated troponin The patient ambulated in the morning and had no symptoms with exertion, no ongoing chest pain, no symptoms with her heart at normal rates.  Recent stress test reviewed, likely artifact versus small vessel disease, doubt ACS, no further ischemic work-up necessary.  Hypertension Blood pressure soft after diltiazem drip, ARB held at discharge.              Discharge Instructions  Discharge Instructions    Discharge instructions   Complete by: As directed    You were admitted for fast atrial fibrillation.  You were given extra doses of your diltiazem through the IV and your heart rate was normal.  CHANGE your diltiazem dose  Take diltiazem CD 300 once daily  On an as needed basis, you can also take metoprolol 25 mg  Metoprolol is a short acting medicine, lasts 8-12 hours only 25 mg is a small dose, and might be effective to take if you feel heart palpitations  Instructions: If you have heart palpitations or feel your heart is racing. Rest and take metoprolol 25 mg once by mouth You can repeat this up to every six hours if you still feel palpitations, but if you have to repeat the dose, please call Dr. Court Joy office (this is okay to do, even on the weekend)   Resume your other home medicines EXCEPT olmesartan For the next few days: do not take olmesartan You may restart it on Monday  Please go see Dr. Court Joy partner Becky [name] at the appointment time listed below   Increase activity slowly   Complete by: As directed      Allergies as of 06/10/2019      Reactions   Hydralazine Nausea Only   Other    Band aids discolor skin ekg pads irritate skin    Prednisone Swelling   Patient states that she can take methylprednisolone without complications      Medication List  STOP taking these medications   diltiazem 240 MG 24 hr capsule Commonly known as: DILACOR XR     TAKE these medications   acetaminophen 500 MG tablet Commonly known as: TYLENOL Take 1,000 mg by mouth every 6 (six) hours as needed for moderate pain or headache.   Armour Thyroid 90 MG tablet Generic drug: thyroid Take 90 mg by mouth daily before breakfast.   budesonide 32 MCG/ACT nasal spray Commonly known as: RHINOCORT AQUA Place 1 spray into both nostrils daily as needed for rhinitis.   cetirizine 10 MG  tablet Commonly known as: ZYRTEC Take 10 mg by mouth daily as needed for allergies.   dexlansoprazole 60 MG capsule Commonly known as: DEXILANT Take 1 capsule (60 mg total) by mouth daily.   Dialyvite Vitamin D 5000 125 MCG (5000 UT) capsule Generic drug: Cholecalciferol Take 5,000 Units by mouth daily.   diltiazem 300 MG 24 hr capsule Commonly known as: Cardizem CD Take 1 capsule (300 mg total) by mouth daily.   docusate sodium 100 MG capsule Commonly known as: Colace Take 1 capsule (100 mg total) by mouth daily as needed. What changed: reasons to take this   EPINEPHrine 0.3 mg/0.3 mL Soaj injection Commonly known as: EPI-PEN Inject 0.3 mg into the muscle once as needed for anaphylaxis.   hydrocortisone 25 MG suppository Commonly known as: ANUSOL-HC Place 1 suppository (25 mg total) rectally every 12 (twelve) hours. What changed:   when to take this  reasons to take this   linaclotide 72 MCG capsule Commonly known as: Linzess Take 1 capsule (72 mcg total) by mouth daily before breakfast.   metoprolol tartrate 25 MG tablet Commonly known as: LOPRESSOR Take 1 tablet (25 mg total) by mouth every 12 (twelve) hours as needed (For palpitations or heart racing).   montelukast 10 MG tablet Commonly known as: SINGULAIR Take 10 mg by mouth daily.   olmesartan 40 MG tablet Commonly known as: BENICAR Take 40 mg by mouth daily.   ondansetron 4 MG tablet Commonly known as: Zofran Take 1 tablet (4 mg total) by mouth every 8 (eight) hours as needed for nausea or vomiting.   oxyCODONE-acetaminophen 5-325 MG tablet Commonly known as: Percocet Take 1 tablet by mouth every 4 (four) hours as needed (max 6 q). What changed: reasons to take this   REFRESH DRY EYE THERAPY OP Apply 1 drop to eye daily as needed (dry eyes).   rivaroxaban 20 MG Tabs tablet Commonly known as: Xarelto Take 1 tablet (20 mg total) by mouth daily.   rosuvastatin 5 MG tablet Commonly known as:  CRESTOR Take 1 tablet by mouth once daily   spironolactone 25 MG tablet Commonly known as: ALDACTONE TAKE ONE (1) TABLET BY MOUTH EVERY DAY What changed:   how much to take  how to take this  when to take this  additional instructions   Trulance 3 MG Tabs Generic drug: Plecanatide TAKE 1 TABLET BY MOUTH DAILY. TAKE WITH OR WITHOUT FOOD (PA REQUIRED) What changed: See the new instructions.   Xiidra 5 % Soln Generic drug: Lifitegrast Place 1 drop into both eyes 2 (two) times daily as needed (dry eyes).      Follow-up Information    [name], Evelene Croon, PA-C Follow up on 06/24/2019.   Specialties: Cardiology, Radiology Why: Cardiology Hospital Follow-up on 06/24/2019 at 3:30 PM with Rosaria Ferries, PA-C (works with Dr. Bronson Ing). Contact information: [address] [place] [address] [phone]  Allergies  Allergen Reactions  . Hydralazine Nausea Only  . Other     Band aids discolor skin ekg pads irritate skin   . Prednisone Swelling    Patient states that she can take methylprednisolone without complications    Consultations:     Procedures/Studies: DG Chest Port 1 View  Result Date: 06/10/2019 CLINICAL DATA:  Chest pain and cardiac palpitations EXAM: PORTABLE CHEST 1 VIEW COMPARISON:  11/07/2016 FINDINGS: Cardiac shadow is enlarged. The lungs are well aerated bilaterally. No focal infiltrate or sizable effusion is seen. No bony abnormality is noted. IMPRESSION: No active disease. Electronically Signed   By: Inez Catalina M.D.   On: 06/10/2019 03:31   Cpap titration  Result Date: 05/15/2019 Star Age, MD     05/30/2019  7:23 AM S PATIENT'S NAME:  [name] DOB:      [birth year]     MR#:    [id number]    DATE OF RECORDING: 05/15/2019 REFERRING M.D.:  Dr. Court Joy Study Performed:   CPAP  Titration HISTORY: 59 year old woman with a history of hypertension, hyperlipidemia, hypothyroidism, paroxysmal A. fib, arthritis, back pain, and morbid  obesity with a BMI of over 40, who presents for a full night titration study. Her baseline sleep study from 04/07/19 showed a total AHI of 33.2/hour, and low spo2 of 80%. The patient endorsed the Epworth Sleepiness Scale at 10 points. The patient's weight 260 pounds with a height of 67 (inches), resulting in a BMI of 40.8 kg/m2. The patient's neck circumference measured 17 inches. CURRENT MEDICATIONS: Trulance, Aldactone, Crestor, Xarelto, Oxycodone, Zofran, Benicar, Singuair, Xiidra, Anusol, Refresh eye drops, Epipen, Colace, Dilacor XR, Dexilant, Vitamin D, Zyrtec, Rhinocort aqua, Armour thyroid, Tylenol  PROCEDURE:  This is a multichannel digital polysomnogram utilizing the SomnoStar 11.2 system.  Electrodes and sensors were applied and monitored per AASM Specifications.   EEG, EOG, Chin and Limb EMG, were sampled at 200 Hz.  ECG, Snore and Nasal Pressure, Thermal Airflow, Respiratory Effort, CPAP Flow and Pressure, Oximetry was sampled at 50 Hz. Digital video and audio were recorded.    The patient was fitted with a SW F30i mask. CPAP was initiated at 5 cmH20 with heated humidity per AASM standards and pressure was advanced to 13 cm H20 because of hypopneas, apneas and desaturations.  At a PAP pressure of 11 cmH20, there was a reduction of the AHI to 5.6/hour with non-supine REM sleep achieved and O2 nadir of 87%, but there were several instances of dislodged O2 sensor. Lights Out was at 22:10 and Lights On at 04:59. Total recording time (TRT) was 410 minutes, with a total sleep time (TST) of 340.5 minutes. The patient's sleep latency was 10.5 minutes. REM latency was 50 minutes, which is mildly reduced. The sleep efficiency was 83%.  SLEEP ARCHITECTURE: WASO (Wake after sleep onset) was 64 minutes with mild to moderate sleep fragmentation noted. There were 35.5 minutes in Stage N1, 142.5 minutes Stage N2, 91.5 minutes Stage N3 and 71 minutes in Stage REM.  The percentage of Stage N1 was 10.4%, which is increased,  Stage N2 was 41.9%, Stage N3 was 26.9% and Stage R (REM sleep) was 20.9%. The arousals were noted as: 67 were spontaneous, 0 were associated with PLMs, 9 were associated with respiratory events. RESPIRATORY ANALYSIS:  There was a total of 28 respiratory events: 1 obstructive apneas, 1 central apneas and 0 mixed apneas with a total of 2 apneas and an apnea index (AI) of .4 /hour. There were 26  hypopneas with a hypopnea index of 4.6/hour. The patient also had 0 respiratory event related arousals (RERAs).    The total APNEA/HYPOPNEA INDEX  (AHI) was 4.9 /hour and the total RESPIRATORY DISTURBANCE INDEX was 4.9 /hour  0 events occurred in REM sleep and 28 events in NREM. The REM AHI was 0 /hour versus a non-REM AHI of 6.2 /hour.  The patient spent 111.5 minutes of total sleep time in the supine position and 229 minutes in non-supine. The supine AHI was 10.8, versus a non-supine AHI of 2.1. OXYGEN SATURATION & C02:  The baseline 02 saturation was 94%, with the lowest being 84%. Time spent below 89% saturation equaled 10 minutes. PERIODIC LIMB MOVEMENTS:  The patient had a total of 0 Periodic Limb Movements. The Periodic Limb Movement (PLM) index was 0 and the PLM Arousal index was 0 /hour. Audio and video analysis did not show any abnormal or unusual movements, behaviors, phonations or vocalizations. The patient took one bathroom break. The EKG was in keeping with normal sinus rhythm (NSR) with PVCs noted. Post-study, the patient indicated that sleep was better than usual. IMPRESSION: 1. Obstructive Sleep Apnea (OSA) 2. Dysfunctions associated with sleep stages or arousal from sleep 3. Non-specific abnormal EKG  RECOMMENDATIONS: 1. This study demonstrates significant improvement of the patient's obstructive sleep apnea with CPAP therapy. I will, therefore, start the patient on home CPAP treatment at a pressure of 11 cm via SW F30i FFM with (heated) humidity. The patient should be reminded to be fully compliant with PAP  therapy to improve sleep related symptoms and decrease long term cardiovascular risks. The patient should be reminded, that it may take up to 3 months to get fully used to using PAP with all planned sleep. The earlier full compliance is achieved, the better long term compliance tends to be. Please note that untreated obstructive sleep apnea may carry additional perioperative morbidity. Patients with significant obstructive sleep apnea should receive perioperative PAP therapy and the surgeons and particularly the anesthesiologist should be informed of the diagnosis and the severity of the sleep disordered breathing. 2. This study shows sleep fragmentation and abnormal sleep stage percentages; these are nonspecific findings and per se do not signify an intrinsic sleep disorder or a cause for the patient's sleep-related symptoms. Causes include (but are not limited to) the first night effect of the sleep study, circadian rhythm disturbances, medication effect or an underlying mood disorder or medical problem. 3. The patient should be cautioned not to drive, work at heights, or operate dangerous or heavy equipment when tired or sleepy. Review and reiteration of good sleep hygiene measures should be pursued with any patient. 4. The study showed PVCs on single lead EKG; clinical correlation is recommended. 5. The patient will be seen in follow-up in the sleep clinic at [facility] for discussion of the test results, symptom and treatment compliance review, further management strategies, etc. The referring provider will be notified of the test results.  I certify that I have reviewed the entire raw data recording prior to the issuance of this report in accordance with the Standards of Accreditation of the American Academy of Sleep Medicine (AASM)  Star Age, MD, PhD Diplomat, American Board of Neurology and Sleep Medicine (Neurology and Sleep Medicine)      Subjective: Patient feeling well.  No chest pain.  No confusion.   No further palpitations.  Discharge Exam: Vitals:   06/10/19 1157 06/10/19 1200  BP:  107/72  Pulse:    Resp:  Marland Kitchen)  23  Temp: 97.7 F (36.5 C)   SpO2:  98%   Vitals:   06/10/19 1019 06/10/19 1100 06/10/19 1157 06/10/19 1200  BP: 108/69 (!) 92/52  107/72  Pulse:      Resp: 18 18  (!) 23  Temp:   97.7 F (36.5 C)   TempSrc:   Oral   SpO2: 98% 99%  98%  Weight:      Height:        Patient was seen and examined.  Please see physical exam documented in H&P from earlier today.   The results of significant diagnostics from this hospitalization (including imaging, microbiology, ancillary and laboratory) are listed below for reference.     Microbiology: Recent Results (from the past 240 hour(s))  SARS Coronavirus 2 by RT PCR (hospital order, performed in [facility] hospital lab) Nasopharyngeal Nasopharyngeal Swab     Status: None   Collection Time: 06/10/19  3:16 AM   Specimen: Nasopharyngeal Swab  Result Value Ref Range Status   SARS Coronavirus 2 NEGATIVE NEGATIVE Final    Comment: (NOTE) SARS-CoV-2 target nucleic acids are NOT DETECTED. The SARS-CoV-2 RNA is generally detectable in upper and lower respiratory specimens during the acute phase of infection. The lowest concentration of SARS-CoV-2 viral copies this assay can detect is 250 copies / mL. A negative result does not preclude SARS-CoV-2 infection and should not be used as the sole basis for treatment or other patient management decisions.  A negative result may occur with improper specimen collection / handling, submission of specimen other than nasopharyngeal swab, presence of viral mutation(s) within the areas targeted by this assay, and inadequate number of viral copies (<250 copies / mL). A negative result must be combined with clinical observations, patient history, and epidemiological information. Fact Sheet for Patients:   StrictlyIdeas.no Fact Sheet for Healthcare  Providers: BankingDealers.co.za This test is not yet approved or cleared  by the [place] FDA and has been authorized for detection and/or diagnosis of SARS-CoV-2 by FDA under an Emergency Use Authorization (EUA).  This EUA will remain in effect (meaning this test can be used) for the duration of the COVID-19 declaration under Section 564(b)(1) of the Act, 21 U.S.C. section 360bbb-3(b)(1), unless the authorization is terminated or revoked sooner. Performed at [facility], [address] [place], [place] [address]      Labs: BNP (last 3 results) No results for input(s): BNP in the last 8760 hours. Basic Metabolic Panel: Recent Labs  Lab 06/10/19 0125  NA 137  K 3.6  CL 103  CO2 26  GLUCOSE 121*  BUN 14  CREATININE 0.92  CALCIUM 9.9   Liver Function Tests: No results for input(s): AST, ALT, ALKPHOS, BILITOT, PROT, ALBUMIN in the last 168 hours. No results for input(s): LIPASE, AMYLASE in the last 168 hours. No results for input(s): AMMONIA in the last 168 hours. CBC: Recent Labs  Lab 06/10/19 0125  WBC 9.3  HGB 13.1  HCT 39.9  MCV 92.8  PLT 378   Cardiac Enzymes: No results for input(s): CKTOTAL, CKMB, CKMBINDEX, TROPONINI in the last 168 hours. BNP: Invalid input(s): POCBNP CBG: No results for input(s): GLUCAP in the last 168 hours. D-Dimer No results for input(s): DDIMER in the last 72 hours. Hgb A1c No results for input(s): HGBA1C in the last 72 hours. Lipid Profile No results for input(s): CHOL, HDL, LDLCALC, TRIG, CHOLHDL, LDLDIRECT in the last 72 hours. Thyroid function studies Recent Labs    06/10/19 0333  TSH  1.744   Anemia work up No results for input(s): VITAMINB12, FOLATE, FERRITIN, TIBC, IRON, RETICCTPCT in the last 72 hours. Urinalysis    Component Value Date/Time   COLORURINE YELLOW 07/30/2018 1038   APPEARANCEUR CLEAR 07/30/2018 1038   APPEARANCEUR Clear 01/01/2017 1400   LABSPEC 1.015 07/30/2018 1038    PHURINE 5.0 07/30/2018 1038   GLUCOSEU NEGATIVE 07/30/2018 1038   HGBUR LARGE (A) 07/30/2018 1038   BILIRUBINUR NEGATIVE 07/30/2018 1038   BILIRUBINUR Negative 01/01/2017 1400   KETONESUR NEGATIVE 07/30/2018 1038   PROTEINUR NEGATIVE 07/30/2018 1038   UROBILINOGEN 0.2 01/16/2018 1308   NITRITE NEGATIVE 07/30/2018 1038   LEUKOCYTESUR NEGATIVE 07/30/2018 1038   Sepsis Labs Invalid input(s): PROCALCITONIN,  WBC,  LACTICIDVEN Microbiology Recent Results (from the past 240 hour(s))  SARS Coronavirus 2 by RT PCR (hospital order, performed in [place] hospital lab) Nasopharyngeal Nasopharyngeal Swab     Status: None   Collection Time: 06/10/19  3:16 AM   Specimen: Nasopharyngeal Swab  Result Value Ref Range Status   SARS Coronavirus 2 NEGATIVE NEGATIVE Final    Comment: (NOTE) SARS-CoV-2 target nucleic acids are NOT DETECTED. The SARS-CoV-2 RNA is generally detectable in upper and lower respiratory specimens during the acute phase of infection. The lowest concentration of SARS-CoV-2 viral copies this assay can detect is 250 copies / mL. A negative result does not preclude SARS-CoV-2 infection and should not be used as the sole basis for treatment or other patient management decisions.  A negative result may occur with improper specimen collection / handling, submission of specimen other than nasopharyngeal swab, presence of viral mutation(s) within the areas targeted by this assay, and inadequate number of viral copies (<250 copies / mL). A negative result must be combined with clinical observations, patient history, and epidemiological information. Fact Sheet for Patients:   StrictlyIdeas.no Fact Sheet for Healthcare Providers: BankingDealers.co.za This test is not yet approved or cleared  by the [place] FDA and has been authorized for detection and/or diagnosis of SARS-CoV-2 by FDA under an Emergency Use Authorization (EUA).   This EUA will remain in effect (meaning this test can be used) for the duration of the COVID-19 declaration under Section 564(b)(1) of the Act, 21 U.S.C. section 360bbb-3(b)(1), unless the authorization is terminated or revoked sooner. Performed at [facility], [address] [place], [place] [address]      Time coordinating discharge: 35 minutes      SIGNED:   Edwin Dada, MD  [facility] 06/10/2019, 12:29 PM
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
## 2019-06-10 NOTE — Progress Notes (Signed)
Patient is to be discharged home and in stable condition. Patient's IV and telemetry removed,WNL. Patient and daughter given discharge instructions and verbalized understanding. Patient to be escorted out by staff via wheelchair.
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
## 2019-06-11 LAB — T3: T3, Total: 118 ng/dL (ref 71–180)

## 2019-06-20 DIAGNOSIS — G4733 Obstructive sleep apnea (adult) (pediatric): Secondary | ICD-10-CM | POA: Diagnosis not present

## 2019-06-24 ENCOUNTER — Encounter: Payer: Self-pay | Admitting: Physician Assistant

## 2019-06-24 ENCOUNTER — Encounter: Payer: Self-pay | Admitting: *Deleted

## 2019-06-24 ENCOUNTER — Ambulatory Visit (INDEPENDENT_AMBULATORY_CARE_PROVIDER_SITE_OTHER): Payer: BC Managed Care – PPO | Admitting: Physician Assistant

## 2019-06-24 ENCOUNTER — Other Ambulatory Visit: Payer: Self-pay

## 2019-06-24 VITALS — BP 110/60 | HR 78 | Ht 67.0 in | Wt 260.0 lb

## 2019-06-24 DIAGNOSIS — R0600 Dyspnea, unspecified: Secondary | ICD-10-CM | POA: Diagnosis not present

## 2019-06-24 DIAGNOSIS — R0609 Other forms of dyspnea: Secondary | ICD-10-CM

## 2019-06-24 DIAGNOSIS — I209 Angina pectoris, unspecified: Secondary | ICD-10-CM

## 2019-06-24 DIAGNOSIS — I480 Paroxysmal atrial fibrillation: Secondary | ICD-10-CM | POA: Diagnosis not present
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
## 2019-06-24 NOTE — Patient Instructions (Signed)
Medication Instructions:  Continue all current medications.  Labwork: Doing at husband's work with wellness program.  Testing/Procedures:  Coronary CT   Office will contact with results via phone or letter.    Follow-Up: 2-3 weeks after CT   Any Other Special Instructions Will Be Listed Below (If Applicable).  If you need a refill on your cardiac medications before your next appointment, please call your pharmacy.

## 2019-06-29 ENCOUNTER — Telehealth: Payer: Self-pay | Admitting: Cardiovascular Disease

## 2019-06-29 NOTE — Telephone Encounter (Signed)
Called pt who states that her BP has been running low. Today BP was 98/67 HR 71. Pt denies feeling dizzy but states that she has been feeling tired. Please advise.

## 2019-06-29 NOTE — Telephone Encounter (Signed)
Pt has questions about readings she is getting from her BP monitor   Please call [phone]   Thanks renee

## 2019-06-30 MED ORDER — SPIRONOLACTONE 25 MG PO TABS: ORAL_TABLET | ORAL | 3 refills | Status: DC

## 2019-06-30 NOTE — Telephone Encounter (Signed)
If she is taking spironolactone I would have her hold this.

## 2019-06-30 NOTE — Telephone Encounter (Signed)
Pt will hold aldactone and let us known how her BP's are running

## 2019-07-18 DIAGNOSIS — I209 Angina pectoris, unspecified: Secondary | ICD-10-CM | POA: Diagnosis not present

## 2019-07-18 DIAGNOSIS — R0600 Dyspnea, unspecified: Secondary | ICD-10-CM | POA: Diagnosis not present

## 2019-07-18 DIAGNOSIS — I480 Paroxysmal atrial fibrillation: Secondary | ICD-10-CM | POA: Diagnosis not present

## 2019-07-18 NOTE — Addendum Note (Signed)
Addended by: Levonne Hubert on: 07/18/2019 10:54 AM   Modules accepted: Orders

## 2019-07-19 ENCOUNTER — Other Ambulatory Visit: Payer: Self-pay

## 2019-07-19 ENCOUNTER — Encounter: Payer: Self-pay | Admitting: Cardiology

## 2019-07-19 ENCOUNTER — Ambulatory Visit (INDEPENDENT_AMBULATORY_CARE_PROVIDER_SITE_OTHER): Payer: BC Managed Care – PPO | Admitting: Cardiology

## 2019-07-19 VITALS — BP 122/84 | HR 78 | Ht 67.0 in | Wt 259.0 lb

## 2019-07-19 DIAGNOSIS — I480 Paroxysmal atrial fibrillation: Secondary | ICD-10-CM

## 2019-07-19 DIAGNOSIS — R600 Localized edema: Secondary | ICD-10-CM | POA: Diagnosis not present

## 2019-07-19 DIAGNOSIS — R0789 Other chest pain: Secondary | ICD-10-CM | POA: Diagnosis not present

## 2019-07-19 DIAGNOSIS — R0602 Shortness of breath: Secondary | ICD-10-CM | POA: Diagnosis not present

## 2019-07-19 DIAGNOSIS — I10 Essential (primary) hypertension: Secondary | ICD-10-CM

## 2019-07-19 LAB — BASIC METABOLIC PANEL
BUN/Creatinine Ratio: 18 (ref 9–23)
BUN: 19 mg/dL (ref 6–24)
CO2: 23 mmol/L (ref 20–29)
Calcium: 10 mg/dL (ref 8.7–10.2)
Chloride: 105 mmol/L (ref 96–106)
Creatinine, Ser: 1.04 mg/dL — ABNORMAL HIGH (ref 0.57–1.00)
GFR calc Af Amer: 68 mL/min/{1.73_m2} (ref >59)
GFR calc non Af Amer: 59 mL/min/{1.73_m2} — ABNORMAL LOW (ref >59)
Glucose: 109 mg/dL — ABNORMAL HIGH (ref 65–99)
Potassium: 4.4 mmol/L (ref 3.5–5.2)
Sodium: 142 mmol/L (ref 134–144)

## 2019-07-19 MED ORDER — FUROSEMIDE 40 MG PO TABS
40.0000 mg | ORAL_TABLET | Freq: Every day | ORAL | 11 refills | Status: DC | PRN
Start: 2019-07-19 — End: 2021-09-13

## 2019-07-19 MED ORDER — ROSUVASTATIN CALCIUM 5 MG PO TABS: 5.0000 mg | ORAL_TABLET | Freq: Every day | ORAL | 3 refills | Status: DC
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
## 2019-07-19 NOTE — Patient Instructions (Signed)
Medication Instructions:  Your physician recommends that you continue on your current medications as directed. Please refer to the Current Medication list given to you today.  Start Lasix 40 mg As needed   *If you need a refill on your cardiac medications before your next appointment, please call your pharmacy*   Lab Work: NONE   If you have labs (blood work) drawn today and your tests are completely normal, you will receive your results only by: Marland Kitchen MyChart Message (if you have MyChart) OR . A paper copy in the mail If you have any lab test that is abnormal or we need to change your treatment, we will call you to review the results.   Testing/Procedures: Your physician has requested that you have an echocardiogram. Echocardiography is a painless test that uses sound waves to create images of your heart. It provides your doctor with information about the size and shape of your heart and how well your heart's chambers and valves are working. This procedure takes approximately one hour. There are no restrictions for this procedure.  Follow-Up: At [facility], you and your health needs are our priority.  As part of our continuing mission to provide you with exceptional heart care, we have created designated Provider Care Teams.  These Care Teams include your primary Cardiologist (physician) and Advanced Practice Providers (APPs -  Physician Assistants and Nurse Practitioners) who all work together to provide you with the care you need, when you need it.  We recommend signing up for the patient portal called "MyChart".  Sign up information is provided on this After Visit Summary.  MyChart is used to connect with patients for Virtual Visits (Telemedicine).  Patients are able to view lab/test results, encounter notes, upcoming appointments, etc.  Non-urgent messages can be sent to your provider as well.   To learn more about what you can do with MyChart, go to [email or web address].    Your  next appointment:    As Planned   The format for your next appointment:   In Person  Provider:   Carlyle Dolly, MD   Other Instructions Thank you for choosing [facility]!

## 2019-07-21 ENCOUNTER — Telehealth (HOSPITAL_COMMUNITY): Payer: Self-pay | Admitting: *Deleted

## 2019-07-21 NOTE — Telephone Encounter (Signed)
Attempted to call patient regarding upcoming cardiac CT appointment. Left message on voicemail with name and callback number   Tai RN Navigator Cardiac Imaging [facility] [phone] Office [phone] Cell  

## 2019-07-22 ENCOUNTER — Ambulatory Visit (HOSPITAL_COMMUNITY)
Admission: RE | Admit: 2019-07-22 | Discharge: 2019-07-22 | Disposition: A | Discharge status: Home / Self Care | Payer: BC Managed Care – PPO | Source: Ambulatory Visit | Attending: Physician Assistant | Admitting: Physician Assistant

## 2019-07-22 ENCOUNTER — Other Ambulatory Visit: Payer: Self-pay

## 2019-07-22 DIAGNOSIS — I209 Angina pectoris, unspecified: Secondary | ICD-10-CM

## 2019-07-22 MED ORDER — NITROGLYCERIN 0.4 MG SL SUBL: 0.4000 mg | SUBLINGUAL_TABLET | SUBLINGUAL | Status: DC | PRN

## 2019-07-22 MED ORDER — NITROGLYCERIN 0.4 MG SL SUBL
0.8000 mg | SUBLINGUAL_TABLET | SUBLINGUAL | Status: DC | PRN
  Administered 2019-07-22: 0.8 mg via SUBLINGUAL

## 2019-07-22 MED ORDER — IOHEXOL 350 MG/ML SOLN
80.0000 mL | Freq: Once | INTRAVENOUS | Status: AC | PRN
  Administered 2019-07-22: 80 mL via INTRAVENOUS

## 2019-07-22 MED ORDER — NITROGLYCERIN 0.4 MG SL SUBL
SUBLINGUAL_TABLET | SUBLINGUAL | Status: AC
  Filled 2019-07-22: qty 2

## 2019-07-25 ENCOUNTER — Other Ambulatory Visit: Payer: Self-pay

## 2019-07-25 ENCOUNTER — Ambulatory Visit (HOSPITAL_COMMUNITY)
Admission: RE | Admit: 2019-07-25 | Discharge: 2019-07-25 | Disposition: A | Discharge status: Home / Self Care | Payer: BC Managed Care – PPO | Source: Ambulatory Visit | Attending: Internal Medicine | Admitting: Internal Medicine

## 2019-07-25 DIAGNOSIS — R0602 Shortness of breath: Secondary | ICD-10-CM

## 2019-07-25 DIAGNOSIS — I209 Angina pectoris, unspecified: Secondary | ICD-10-CM | POA: Diagnosis not present

## 2019-07-25 LAB — ECHOCARDIOGRAM COMPLETE
AR max vel: 2.12 cm2
AV Area VTI: 2.15 cm2
AV Area mean vel: 2.22 cm2
AV Mean grad: 7.6 mmHg
AV Peak grad: 14.6 mmHg
Ao pk vel: 1.91 m/s
Area-P 1/2: 3.13 cm2
S' Lateral: 3.05 cm

## 2019-07-25 NOTE — Progress Notes (Signed)
*  PRELIMINARY RESULTS* Echocardiogram 2D Echocardiogram has been performed.  Becky [name] 07/25/2019, 10:19 AM

## 2019-07-27 ENCOUNTER — Telehealth: Payer: Self-pay

## 2019-07-27 NOTE — Telephone Encounter (Signed)
Left a detailed message for the patient stating that I was calling to move her appointment up to tomorrow at 3PM with Rosaria Ferries, PA-C at the [facility] office to discuss her recent Coronary CT results. I also asked for the patient to give our office a call back so we can arrange the reschedule of her appointment to 07/28/2019.

## 2019-07-27 NOTE — Telephone Encounter (Signed)
-----   Message from [place], [place] sent at 07/27/2019  8:54 AM EDT ----- Covering for Rhonda's in basket, needs a earlier follow up in our [place] office to discuss CT result and further study

## 2019-07-28 ENCOUNTER — Encounter: Payer: Self-pay | Admitting: Cardiology

## 2019-07-28 ENCOUNTER — Telehealth (INDEPENDENT_AMBULATORY_CARE_PROVIDER_SITE_OTHER): Payer: BC Managed Care – PPO | Admitting: Cardiology

## 2019-07-28 ENCOUNTER — Other Ambulatory Visit: Payer: Self-pay

## 2019-07-28 VITALS — BP 118/78 | HR 74 | Ht 67.0 in | Wt 259.0 lb

## 2019-07-28 DIAGNOSIS — R0609 Other forms of dyspnea: Secondary | ICD-10-CM

## 2019-07-28 DIAGNOSIS — Z01818 Encounter for other preprocedural examination: Secondary | ICD-10-CM | POA: Diagnosis not present

## 2019-07-28 DIAGNOSIS — R0602 Shortness of breath: Secondary | ICD-10-CM

## 2019-07-28 DIAGNOSIS — R0600 Dyspnea, unspecified: Secondary | ICD-10-CM

## 2019-07-28 DIAGNOSIS — R0789 Other chest pain: Secondary | ICD-10-CM | POA: Diagnosis not present

## 2019-07-28 DIAGNOSIS — Z0189 Encounter for other specified special examinations: Secondary | ICD-10-CM | POA: Diagnosis not present

## 2019-07-28 NOTE — Patient Instructions (Signed)
.  Medication Instructions:  Your physician recommends that you continue on your current medications as directed. Please refer to the Current Medication list given to you today.  *If you need a refill on your cardiac medications before your next appointment, please call your pharmacy*   Lab Work: CBC for heart cath to be done at [place]  If you have labs (blood work) drawn today and your tests are completely normal, you will receive your results only by: Marland Kitchen MyChart Message (if you have MyChart) OR . A paper copy in the mail If you have any lab test that is abnormal or we need to change your treatment, we will call you to review the results.   Testing/Procedures: Your physician has requested that you have a cardiac catheterization. Cardiac catheterization is used to diagnose and/or treat various heart conditions. Doctors may recommend this procedure for a number of different reasons. The most common reason is to evaluate chest pain. Chest pain can be a symptom of coronary artery disease (CAD), and cardiac catheterization can show whether plaque is narrowing or blocking your heart's arteries. This procedure is also used to evaluate the valves, as well as measure the blood flow and oxygen levels in different parts of your heart. For further information please visit [email or web address]. Please follow instruction sheet, as given.    Follow-Up: At [facility], you and your health needs are our priority.  As part of our continuing mission to provide you with exceptional heart care, we have created designated Provider Care Teams.  These Care Teams include your primary Cardiologist (physician) and Advanced Practice Providers (APPs -  Physician Assistants and Nurse Practitioners) who all work together to provide you with the care you need, when you need it.  We recommend signing up for the patient portal called "MyChart".  Sign up information is provided on this After Visit Summary.  MyChart is used to  connect with patients for Virtual Visits (Telemedicine).  Patients are able to view lab/test results, encounter notes, upcoming appointments, etc.  Non-urgent messages can be sent to your provider as well.   To learn more about what you can do with MyChart, go to [email or web address].    Your next appointment:   1 month(s)  The format for your next appointment:   In Person  Provider:   Bernerd Pho, PA-C or Ermalinda Barrios, PA-C   Other Instructions See heart cath instructions attached

## 2019-07-28 NOTE — Addendum Note (Signed)
Addended by: Barbarann Ehlers A on: 07/28/2019 09:23 AM   Modules accepted: Orders
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
## 2019-08-02 ENCOUNTER — Telehealth: Payer: Self-pay | Admitting: *Deleted

## 2019-08-02 NOTE — Telephone Encounter (Signed)
Pt contacted pre-catheterization scheduled at [facility] for: Wednesday August 03, 2019 8:30 AM Verified arrival time and place: [facility]) at: 6:30 AM   No solid food after midnight prior to cath, clear liquids until 5 AM day of procedure.  Hold: Xarelto-none 08/01/19 until post procedure. Spironolactone -AM of procedure. Lasix- AM of procedure  Except hold medications AM meds can be  taken pre-cath with sips of water including: ASA 81 mg   Confirmed patient has responsible adult to drive home post procedure and observe 24 hours after arriving home: yes  You are allowed ONE visitor in the waiting room during your procedure. Both you and your visitor must wear a mask once you enter the hospital.      COVID-19 Pre-Screening Questions:  . In the past 14 days have you had a new cough associated with shortness of breath, fever (100.4 or greater) or sudden loss of taste or sense of smell? no . In the past 14 days have you been around anyone with known Covid 19? no . Any international travel in the past 14 days? no . Have you been vaccinated for COVID-19? no  Reviewed procedure/mask/visitor instructions, COVID-19 questions with patient.  CBC done 07/28/19 scanned to Epic.

## 2019-08-03 ENCOUNTER — Ambulatory Visit (HOSPITAL_COMMUNITY)
Admission: RE | Admit: 2019-08-03 | Discharge: 2019-08-03 | Disposition: A | Discharge status: Home / Self Care | Payer: BC Managed Care – PPO | Attending: Interventional Cardiology | Admitting: Interventional Cardiology

## 2019-08-03 ENCOUNTER — Encounter (HOSPITAL_COMMUNITY): Payer: Self-pay | Admitting: Interventional Cardiology

## 2019-08-03 ENCOUNTER — Other Ambulatory Visit: Payer: Self-pay

## 2019-08-03 ENCOUNTER — Encounter (HOSPITAL_COMMUNITY)
Admission: RE | Disposition: A | Discharge status: Home / Self Care | Payer: Self-pay | Source: Home / Self Care | Attending: Interventional Cardiology

## 2019-08-03 DIAGNOSIS — R0609 Other forms of dyspnea: Secondary | ICD-10-CM | POA: Insufficient documentation

## 2019-08-03 DIAGNOSIS — E039 Hypothyroidism, unspecified: Secondary | ICD-10-CM | POA: Insufficient documentation

## 2019-08-03 DIAGNOSIS — E785 Hyperlipidemia, unspecified: Secondary | ICD-10-CM | POA: Diagnosis not present

## 2019-08-03 DIAGNOSIS — R079 Chest pain, unspecified: Secondary | ICD-10-CM | POA: Diagnosis not present

## 2019-08-03 DIAGNOSIS — Z888 Allergy status to other drugs, medicaments and biological substances status: Secondary | ICD-10-CM | POA: Diagnosis not present

## 2019-08-03 DIAGNOSIS — I480 Paroxysmal atrial fibrillation: Secondary | ICD-10-CM | POA: Insufficient documentation

## 2019-08-03 DIAGNOSIS — Q211 Atrial septal defect: Secondary | ICD-10-CM | POA: Diagnosis not present

## 2019-08-03 DIAGNOSIS — R0602 Shortness of breath: Secondary | ICD-10-CM

## 2019-08-03 DIAGNOSIS — I10 Essential (primary) hypertension: Secondary | ICD-10-CM | POA: Insufficient documentation

## 2019-08-03 DIAGNOSIS — G4730 Sleep apnea, unspecified: Secondary | ICD-10-CM | POA: Diagnosis not present

## 2019-08-03 DIAGNOSIS — E7800 Pure hypercholesterolemia, unspecified: Secondary | ICD-10-CM | POA: Diagnosis not present

## 2019-08-03 DIAGNOSIS — Z87891 Personal history of nicotine dependence: Secondary | ICD-10-CM | POA: Diagnosis not present

## 2019-08-03 DIAGNOSIS — I2510 Atherosclerotic heart disease of native coronary artery without angina pectoris: Secondary | ICD-10-CM | POA: Diagnosis not present

## 2019-08-03 HISTORY — PX: LEFT HEART CATH AND CORONARY ANGIOGRAPHY: CATH118249

## 2019-08-03 SURGERY — LEFT HEART CATH AND CORONARY ANGIOGRAPHY
Anesthesia: LOCAL

## 2019-08-03 MED ORDER — LIDOCAINE HCL (PF) 1 % IJ SOLN
INTRAMUSCULAR | Status: AC
  Filled 2019-08-03: qty 30

## 2019-08-03 MED ORDER — ONDANSETRON HCL 4 MG/2ML IJ SOLN: 4.0000 mg | Freq: Four times a day (QID) | INTRAMUSCULAR | Status: DC | PRN

## 2019-08-03 MED ORDER — SODIUM CHLORIDE 0.9% FLUSH: 3.0000 mL | Freq: Two times a day (BID) | INTRAVENOUS | Status: DC

## 2019-08-03 MED ORDER — HEPARIN (PORCINE) IN NACL 1000-0.9 UT/500ML-% IV SOLN
INTRAVENOUS | Status: DC | PRN
  Administered 2019-08-03 (×2): 500 mL

## 2019-08-03 MED ORDER — SODIUM CHLORIDE 0.9 % WEIGHT BASED INFUSION: 1.0000 mL/kg/h | INTRAVENOUS | Status: DC

## 2019-08-03 MED ORDER — ASPIRIN 81 MG PO CHEW: 81.0000 mg | CHEWABLE_TABLET | ORAL | Status: DC

## 2019-08-03 MED ORDER — SODIUM CHLORIDE 0.9 % IV SOLN: 250.0000 mL | INTRAVENOUS | Status: DC | PRN

## 2019-08-03 MED ORDER — SODIUM CHLORIDE 0.9% FLUSH: 3.0000 mL | INTRAVENOUS | Status: DC | PRN

## 2019-08-03 MED ORDER — MIDAZOLAM HCL 2 MG/2ML IJ SOLN
INTRAMUSCULAR | Status: AC
  Filled 2019-08-03: qty 2

## 2019-08-03 MED ORDER — HEPARIN SODIUM (PORCINE) 1000 UNIT/ML IJ SOLN
INTRAMUSCULAR | Status: AC
  Filled 2019-08-03: qty 1

## 2019-08-03 MED ORDER — ACETAMINOPHEN 325 MG PO TABS: 650.0000 mg | ORAL_TABLET | ORAL | Status: DC | PRN

## 2019-08-03 MED ORDER — VERAPAMIL HCL 2.5 MG/ML IV SOLN
INTRAVENOUS | Status: AC
  Filled 2019-08-03: qty 2

## 2019-08-03 MED ORDER — HYDRALAZINE HCL 20 MG/ML IJ SOLN: 10.0000 mg | INTRAMUSCULAR | Status: DC | PRN

## 2019-08-03 MED ORDER — SODIUM CHLORIDE 0.9 % IV SOLN: INTRAVENOUS | Status: DC

## 2019-08-03 MED ORDER — HEPARIN (PORCINE) IN NACL 1000-0.9 UT/500ML-% IV SOLN
INTRAVENOUS | Status: AC
  Filled 2019-08-03: qty 1000

## 2019-08-03 MED ORDER — LABETALOL HCL 5 MG/ML IV SOLN: 10.0000 mg | INTRAVENOUS | Status: DC | PRN

## 2019-08-03 MED ORDER — LIDOCAINE HCL (PF) 1 % IJ SOLN
INTRAMUSCULAR | Status: DC | PRN
  Administered 2019-08-03: 2 mL

## 2019-08-03 MED ORDER — HEPARIN SODIUM (PORCINE) 1000 UNIT/ML IJ SOLN
INTRAMUSCULAR | Status: DC | PRN
  Administered 2019-08-03: 5000 [IU] via INTRAVENOUS

## 2019-08-03 MED ORDER — SODIUM CHLORIDE 0.9 % WEIGHT BASED INFUSION
3.0000 mL/kg/h | INTRAVENOUS | Status: AC
  Administered 2019-08-03: 3 mL/kg/h via INTRAVENOUS

## 2019-08-03 MED ORDER — RIVAROXABAN 20 MG PO TABS: 20.0000 mg | ORAL_TABLET | Freq: Every day | ORAL | Status: DC

## 2019-08-03 MED ORDER — MIDAZOLAM HCL 2 MG/2ML IJ SOLN
INTRAMUSCULAR | Status: DC | PRN
  Administered 2019-08-03: 2 mg via INTRAVENOUS
  Administered 2019-08-03: 1 mg via INTRAVENOUS

## 2019-08-03 MED ORDER — FENTANYL CITRATE (PF) 100 MCG/2ML IJ SOLN
INTRAMUSCULAR | Status: AC
  Filled 2019-08-03: qty 2

## 2019-08-03 MED ORDER — VERAPAMIL HCL 2.5 MG/ML IV SOLN
INTRAVENOUS | Status: DC | PRN
  Administered 2019-08-03: 10 mL via INTRA_ARTERIAL

## 2019-08-03 MED ORDER — IOHEXOL 350 MG/ML SOLN
INTRAVENOUS | Status: DC | PRN
  Administered 2019-08-03: 70 mL

## 2019-08-03 MED ORDER — FENTANYL CITRATE (PF) 100 MCG/2ML IJ SOLN
INTRAMUSCULAR | Status: DC | PRN
  Administered 2019-08-03 (×2): 25 ug via INTRAVENOUS

## 2019-08-03 SURGICAL SUPPLY — 9 items

## 2019-08-03 NOTE — Interval H&P Note (Signed)
Cath Lab Visit (complete for each Cath Lab visit)  Clinical Evaluation Leading to the Procedure:   ACS: No.  Non-ACS:    Anginal Classification: CCS III  Anti-ischemic medical therapy: Minimal Therapy (1 class of medications)  Non-Invasive Test Results: Intermediate-risk stress test findings: cardiac mortality 1-3%/year  Prior CABG: No previous CABG  Abnormal CT FFR    History and Physical Interval Note:  08/03/2019 8:40 AM  Becky [name]  has presented today for surgery, with the diagnosis of chest pain.  The various methods of treatment have been discussed with the patient and family. After consideration of risks, benefits and other options for treatment, the patient has consented to  Procedure(s): LEFT HEART CATH AND CORONARY ANGIOGRAPHY (N/A) as a surgical intervention.  The patient's history has been reviewed, patient examined, no change in status, stable for surgery.  I have reviewed the patient's chart and labs.  Questions were answered to the patient's satisfaction.     Larae Grooms

## 2019-08-03 NOTE — Discharge Instructions (Signed)
Return to Work ___[name]________________________________________________ was treated at our facility. Injury or illness was: ___Work-related. _X__Not work-related. ___Undetermined if work-related. Return to work  Employee may return to work on _____8/2/21_________________.  Employee may return to modified work on ______________________. Work activity restrictions This person is not able to do the following activities: ___Bend ___Sit for a prolonged time  This person should not sit for more than ____ hours at a time.  This person should not sit for more than ____ hours during an 8-hour workday. _X__Lift more than __10______ lb ___Squat ___ Stand for a prolonged time  ___ This person should not stand for more than ____ hours at a time.  ___ This person should not stand for more than ____ hours during an 8-hour workday. ___Climb __X_Reach ___Push and pull with the ___ right hand ___ left hand ___ Walk  ___ This person should not walk for more than ____ hours at a time.  ___ This person should not walk for more than ____ hours during an 8-hour workday. ___ Drive or operate a motor vehicle at work ___ [employer] with the ___ right hand ___ left hand ___Other _________________________________________________________________ These restrictions are effective until ______________________ or until a recheck appointment on ______________________. Health care provider name (printed): _________________________________________ Health care provider (signature): _________________________________________ Date: _________________________________________ How to use this form Show this Return to Work statement to your supervisor at work as soon as possible. Your employer should be aware of your condition and may be able to help with the necessary work activity restrictions. Contact your health care provider if:  You wish to return to work sooner than the date that is listed  above.  You have problems that make it difficult for you to return at that time. This information is not intended to replace advice given to you by your health care provider. Make sure you discuss any questions you have with your health care provider. Document Revised: 12/18/2016 Document Reviewed: 12/18/2016 Elsevier Patient Education  2020 [place]  This sheet gives you information about how to care for yourself after your procedure. Your health care provider may also give you more specific instructions. If you have problems or questions, contact your health care provider. What can I expect after the procedure? After the procedure, it is common to have:  Bruising and tenderness at the catheter insertion area. Follow these instructions at home: Medicines  Take over-the-counter and prescription medicines only as told by your health care provider. Insertion site care  Follow instructions from your health care provider about how to take care of your insertion site. Make sure you: ? Wash your hands with soap and water before you change your bandage (dressing). If soap and water are not available, use hand sanitizer. ? Change your dressing as told by your health care provider. ? Leave stitches (sutures), skin glue, or adhesive strips in place. These skin closures may need to stay in place for 2 weeks or longer. If adhesive strip edges start to loosen and curl up, you may trim the loose edges. Do not remove adhesive strips completely unless your health care provider tells you to do that.  Check your insertion site every day for signs of infection. Check for: ? Redness, swelling, or pain. ? Fluid or blood. ? Pus or a bad smell. ? Warmth.  Do not take baths, swim, or use a hot tub until your health care provider approves.  You may shower 24-48 hours after the procedure, or as  directed by your health care provider. ? Remove the dressing and gently wash the site with  plain soap and water. ? Pat the area dry with a clean towel. ? Do not rub the site. That could cause bleeding.  Do not apply powder or lotion to the site. Activity   For 24 hours after the procedure, or as directed by your health care provider: ? Do not flex or bend the affected arm. ? Do not push or pull heavy objects with the affected arm. ? Do not drive yourself home from the hospital or clinic. You may drive 24 hours after the procedure unless your health care provider tells you not to. ? Do not operate machinery or power tools.  Do not lift anything that is heavier than 10 lb (4.5 kg), or the limit that you are told, until your health care provider says that it is safe.  Ask your health care provider when it is okay to: ? Return to work or school. ? Resume usual physical activities or sports. ? Resume sexual activity. General instructions  If the catheter site starts to bleed, raise your arm and put firm pressure on the site. If the bleeding does not stop, get help right away. This is a medical emergency.  If you went home on the same day as your procedure, a responsible adult should be with you for the first 24 hours after you arrive home.  Keep all follow-up visits as told by your health care provider. This is important. Contact a health care provider if:  You have a fever.  You have redness, swelling, or yellow drainage around your insertion site. Get help right away if:  You have unusual pain at the radial site.  The catheter insertion area swells very fast.  The insertion area is bleeding, and the bleeding does not stop when you hold steady pressure on the area.  Your arm or hand becomes pale, cool, tingly, or numb. These symptoms may represent a serious problem that is an emergency. Do not wait to see if the symptoms will go away. Get medical help right away. Call your local emergency services (911 in the [place] Do not drive yourself to the  hospital. Summary  After the procedure, it is common to have bruising and tenderness at the site.  Follow instructions from your health care provider about how to take care of your radial site wound. Check the wound every day for signs of infection.  Do not lift anything that is heavier than 10 lb (4.5 kg), or the limit that you are told, until your health care provider says that it is safe. This information is not intended to replace advice given to you by your health care provider. Make sure you discuss any questions you have with your health care provider. Document Revised: 01/28/2017 Document Reviewed: 01/28/2017 Elsevier Patient Education  2020 [employer].

## 2019-08-04 ENCOUNTER — Encounter: Payer: Self-pay | Admitting: Neurology

## 2019-08-04 ENCOUNTER — Ambulatory Visit (INDEPENDENT_AMBULATORY_CARE_PROVIDER_SITE_OTHER): Payer: BC Managed Care – PPO | Admitting: Neurology

## 2019-08-04 VITALS — BP 139/91 | HR 79 | Ht 67.0 in | Wt 258.0 lb

## 2019-08-04 DIAGNOSIS — J343 Hypertrophy of nasal turbinates: Secondary | ICD-10-CM | POA: Diagnosis not present

## 2019-08-04 DIAGNOSIS — Z789 Other specified health status: Secondary | ICD-10-CM | POA: Diagnosis not present

## 2019-08-04 DIAGNOSIS — Z9989 Dependence on other enabling machines and devices: Secondary | ICD-10-CM

## 2019-08-04 DIAGNOSIS — G4733 Obstructive sleep apnea (adult) (pediatric): Secondary | ICD-10-CM

## 2019-08-04 DIAGNOSIS — R0981 Nasal congestion: Secondary | ICD-10-CM | POA: Diagnosis not present

## 2019-08-04 NOTE — Patient Instructions (Signed)
I appreciate you getting restarted on at therapy.  I understand that you are struggling with working on it, try not to skip any nights.  I think it is going to be important long-term for you to be able to use CPAP therapy because of your severe sleep apnea and your history of A. fib.  Please make an appointment for follow-up as soon as possible with your ENT specialist.  You do have congestion in the nose and you have been struggling with a full facemask.  Maybe there is something else I can offer you for the nasal congestion and sinus pressure.  We can consider reducing the pressure of your CPAP to 10 cm, you have decided to continue with the current pressure of 11 cm.  If you change your mind, please call us.  Please follow-up with one of our nurse practitioners in about 3 months. As discussed, most insurances requires that you use CPAP at least 4 hours each night on 70% of the nights, I recommend, that you not skip any nights and use it throughout the night if you can. Getting used to CPAP and staying with the treatment long term does take time and patience and discipline. Untreated obstructive sleep apnea when it is moderate to severe can have an adverse impact on cardiovascular health and raise her risk for heart disease, arrhythmias, hypertension, congestive heart failure, stroke and diabetes. Untreated obstructive sleep apnea causes sleep disruption, nonrestorative sleep, and sleep deprivation. This can have an impact on your day to day functioning and cause daytime sleepiness and impairment of cognitive function, memory loss, mood disturbance, and problems focussing. Using CPAP regularly can improve these symptoms.
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
## 2019-08-15 ENCOUNTER — Ambulatory Visit: Payer: BC Managed Care – PPO | Admitting: Physician Assistant

## 2019-08-16 ENCOUNTER — Telehealth: Payer: Self-pay

## 2019-08-16 NOTE — Telephone Encounter (Signed)
Forms from [place] [employer]  received on 08/01/19. Completed patient auth attached. Took form to MD Box for completion. (Initial/Date)

## 2019-08-26 DIAGNOSIS — M722 Plantar fascial fibromatosis: Secondary | ICD-10-CM | POA: Diagnosis not present

## 2019-08-26 DIAGNOSIS — M778 Other enthesopathies, not elsewhere classified: Secondary | ICD-10-CM | POA: Diagnosis not present

## 2019-08-26 DIAGNOSIS — M79672 Pain in left foot: Secondary | ICD-10-CM | POA: Diagnosis not present

## 2019-08-26 DIAGNOSIS — M79671 Pain in right foot: Secondary | ICD-10-CM | POA: Diagnosis not present

## 2019-08-30 DIAGNOSIS — J309 Allergic rhinitis, unspecified: Secondary | ICD-10-CM | POA: Diagnosis not present

## 2019-09-01 ENCOUNTER — Encounter: Payer: Self-pay | Admitting: Adult Health

## 2019-09-01 ENCOUNTER — Ambulatory Visit (INDEPENDENT_AMBULATORY_CARE_PROVIDER_SITE_OTHER): Payer: BC Managed Care – PPO | Admitting: Adult Health

## 2019-09-01 ENCOUNTER — Telehealth: Payer: BC Managed Care – PPO | Admitting: Cardiovascular Disease

## 2019-09-01 ENCOUNTER — Other Ambulatory Visit (HOSPITAL_COMMUNITY)
Admission: RE | Admit: 2019-09-01 | Discharge: 2019-09-01 | Disposition: A | Discharge status: Home / Self Care | Payer: BC Managed Care – PPO | Source: Ambulatory Visit | Attending: Adult Health | Admitting: Adult Health

## 2019-09-01 VITALS — BP 136/76 | HR 84 | Ht 67.0 in | Wt 259.0 lb

## 2019-09-01 DIAGNOSIS — N898 Other specified noninflammatory disorders of vagina: Secondary | ICD-10-CM | POA: Insufficient documentation

## 2019-09-01 DIAGNOSIS — R1031 Right lower quadrant pain: Secondary | ICD-10-CM | POA: Diagnosis not present

## 2019-09-01 DIAGNOSIS — K5900 Constipation, unspecified: Secondary | ICD-10-CM

## 2019-09-01 NOTE — Progress Notes (Signed)
  Subjective:     Patient ID: Becky [name], female   DOB: Oct 27, 1960, 59 y.o.   MRN: [id number]  HPI [name] is a 59 year old black female, married, PM in complaining of vaginal itching and had RLQ pain last week, is better after having BM but is constipated and takes trulance and sometimes linzess, has seen Dr Oneida Alar.  PCP is Dr Gerarda Fraction  Review of Systems +vaginal itching +RLQ pain +caonstipation Reviewed past medical,surgical, social and family history. Reviewed medications and allergies.     Objective:   Physical Exam BP 136/76 (BP Location: Left Arm, Patient Position: Sitting, Cuff Size: Large)   Pulse 84   Ht 5\' 7"  (1.702 m)   Wt 259 lb (117.5 kg)   BMI 40.57 kg/m  Skin warm and dry.Pelvic: external genitalia is normal in appearance no lesions, vagina: scant discharge without odor,urethra has no lesions or masses noted, cervix:smooth and bulbous, uterus: normal size, shape and contour, non tender, no masses felt, adnexa: no masses, RLQ  tenderness noted. Bladder is non tender and no masses felt. CV swab obtained.   Examination chaperoned by Dwyane Dee LPN  [facility] - 44/92/01 0856      Pregnancy Intention Screening   Does the patient want to become pregnant in the next year? N/A   post menopausal   Does the patient's partner want to become pregnant in the next year? N/A   post menopausal   Would the patient like to discuss contraceptive options today? N/A      Contraception Wrap Up   Current Method No Method - Other Reason   post menopausal   End Method --   post menopausal   Contraception Counseling Provided No          Assessment:     1. Vaginal itching CV swab sent  2. RLQ abdominal pain CV swab sent Will get GYN [place] in about a week  3. Constipation, unspecified constipation type Follow up with [facility]     Plan:     Follow up prn

## 2019-09-02 ENCOUNTER — Ambulatory Visit: Payer: BC Managed Care – PPO | Admitting: Student

## 2019-09-02 ENCOUNTER — Telehealth: Payer: BC Managed Care – PPO | Admitting: Cardiology

## 2019-09-02 ENCOUNTER — Other Ambulatory Visit: Payer: Self-pay | Admitting: Adult Health

## 2019-09-02 LAB — CERVICOVAGINAL ANCILLARY ONLY
Bacterial Vaginitis (gardnerella): NEGATIVE
Candida Glabrata: POSITIVE — AB
Candida Vaginitis: NEGATIVE
Chlamydia: NEGATIVE
Comment: NEGATIVE
Comment: NEGATIVE
Comment: NEGATIVE
Comment: NEGATIVE
Comment: NEGATIVE
Comment: NORMAL
Neisseria Gonorrhea: NEGATIVE
Trichomonas: NEGATIVE

## 2019-09-02 MED ORDER — FLUCONAZOLE 150 MG PO TABS: ORAL_TABLET | ORAL | 1 refills | Status: DC
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
## 2019-09-06 ENCOUNTER — Encounter (HOSPITAL_COMMUNITY): Payer: Self-pay

## 2019-09-06 ENCOUNTER — Ambulatory Visit (HOSPITAL_COMMUNITY)
Admission: EM | Admit: 2019-09-06 | Discharge: 2019-09-06 | Disposition: A | Discharge status: Home / Self Care | Payer: BC Managed Care – PPO | Attending: Family Medicine | Admitting: Family Medicine

## 2019-09-06 ENCOUNTER — Telehealth: Payer: Self-pay | Admitting: Student

## 2019-09-06 DIAGNOSIS — S39012A Strain of muscle, fascia and tendon of lower back, initial encounter: Secondary | ICD-10-CM | POA: Diagnosis not present

## 2019-09-06 MED ORDER — METHYLPREDNISOLONE 4 MG PO TBPK: ORAL_TABLET | ORAL | 0 refills | Status: DC

## 2019-09-06 MED ORDER — METOPROLOL TARTRATE 25 MG PO TABS
25.0000 mg | ORAL_TABLET | Freq: Two times a day (BID) | ORAL | 0 refills | Status: DC | PRN
Start: 2019-09-06 — End: 2019-10-13

## 2019-09-06 MED ORDER — SPIRONOLACTONE 25 MG PO TABS: ORAL_TABLET | ORAL | 1 refills | Status: DC

## 2019-09-06 MED ORDER — CYCLOBENZAPRINE HCL 10 MG PO TABS: 5.0000 mg | ORAL_TABLET | Freq: Three times a day (TID) | ORAL | 0 refills | Status: DC | PRN

## 2019-09-06 NOTE — ED Triage Notes (Signed)
Pt presents with left sided back pain since this morning. Pt states the back pain started this morning when she was working in the garden.

## 2019-09-06 NOTE — Telephone Encounter (Signed)
Home number - vm not activated.   Mobile number - discussed medication refill request.  The Spironolactone had a hold note on it from 06/30/2019 due to low blood pressure.  Patient states that she started taking this medication again after most recent hosp d/c.  Advised her to keep BP log to bring to her next appt on 09/19/2019 in [place] with NP & this can be reviewed at that time.  She verbalized understanding.   Prescriptions refilled as requested.

## 2019-09-06 NOTE — Telephone Encounter (Signed)
New message     *STAT* If patient is at the pharmacy, call can be transferred to refill team.   1. Which medications need to be refilled? (please list name of each medication and dose if known) spironolactone (ALDACTONE) 25 MG tablet  metoprolol tartrate (LOPRESSOR) 25 MG tablet  2. Which pharmacy/location (including street and city if local pharmacy) is medication to be sent to? [facility]   3. Do they need a 30 day or 90 day supply? [place]
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
## 2019-09-16 ENCOUNTER — Ambulatory Visit: Payer: BC Managed Care – PPO | Admitting: Gastroenterology
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
## 2019-09-19 ENCOUNTER — Ambulatory Visit (INDEPENDENT_AMBULATORY_CARE_PROVIDER_SITE_OTHER): Payer: BC Managed Care – PPO | Admitting: Family Medicine

## 2019-09-19 ENCOUNTER — Encounter: Payer: Self-pay | Admitting: Family Medicine

## 2019-09-19 ENCOUNTER — Other Ambulatory Visit: Payer: Self-pay

## 2019-09-19 VITALS — BP 104/68 | HR 67 | Ht 67.0 in | Wt 256.0 lb

## 2019-09-19 DIAGNOSIS — R0609 Other forms of dyspnea: Secondary | ICD-10-CM

## 2019-09-19 DIAGNOSIS — R079 Chest pain, unspecified: Secondary | ICD-10-CM

## 2019-09-19 DIAGNOSIS — I4891 Unspecified atrial fibrillation: Secondary | ICD-10-CM

## 2019-09-19 DIAGNOSIS — I10 Essential (primary) hypertension: Secondary | ICD-10-CM

## 2019-09-19 DIAGNOSIS — R0600 Dyspnea, unspecified: Secondary | ICD-10-CM

## 2019-09-19 MED ORDER — DILTIAZEM HCL ER COATED BEADS 360 MG PO CP24: 360.0000 mg | ORAL_CAPSULE | Freq: Every day | ORAL | 3 refills | Status: DC

## 2019-09-19 NOTE — Patient Instructions (Addendum)
Medication Instructions:   Increase Diltiazem CD to 360mg  daily.  Continue all other current medications.  Labwork: none  Testing/Procedures: none  Follow-Up: 6 months   Any Other Special Instructions Will Be Listed Below (If Applicable).  If you need a refill on your cardiac medications before your next appointment, please call your pharmacy. ]

## 2019-09-23 DIAGNOSIS — M79672 Pain in left foot: Secondary | ICD-10-CM | POA: Diagnosis not present

## 2019-09-23 DIAGNOSIS — M79671 Pain in right foot: Secondary | ICD-10-CM | POA: Diagnosis not present

## 2019-09-23 DIAGNOSIS — M25572 Pain in left ankle and joints of left foot: Secondary | ICD-10-CM | POA: Diagnosis not present

## 2019-09-23 DIAGNOSIS — B351 Tinea unguium: Secondary | ICD-10-CM | POA: Diagnosis not present

## 2019-09-23 DIAGNOSIS — M778 Other enthesopathies, not elsewhere classified: Secondary | ICD-10-CM | POA: Diagnosis not present

## 2019-09-27 ENCOUNTER — Ambulatory Visit: Payer: BC Managed Care – PPO | Admitting: Cardiology

## 2019-09-30 DIAGNOSIS — G4733 Obstructive sleep apnea (adult) (pediatric): Secondary | ICD-10-CM | POA: Diagnosis not present

## 2019-10-13 ENCOUNTER — Other Ambulatory Visit: Payer: Self-pay | Admitting: Family Medicine

## 2019-10-19 ENCOUNTER — Telehealth: Payer: Self-pay | Admitting: Cardiology

## 2019-10-19 DIAGNOSIS — Z23 Encounter for immunization: Secondary | ICD-10-CM | POA: Diagnosis not present

## 2019-10-19 NOTE — Telephone Encounter (Signed)
Left message to return call 

## 2019-10-19 NOTE — Telephone Encounter (Signed)
New Message     Patient would like someone to call her about which Covid Vaccine would be better for her to get with her medical condition

## 2019-10-19 NOTE — Telephone Encounter (Signed)
Patient questioned which vaccine she should get.I told her her any one of the vaccines will work.She has an apt today at 230 pm
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
## 2019-10-28 ENCOUNTER — Encounter: Payer: Self-pay | Admitting: Gastroenterology

## 2019-10-28 ENCOUNTER — Other Ambulatory Visit: Payer: Self-pay

## 2019-10-28 ENCOUNTER — Telehealth: Payer: Self-pay

## 2019-10-28 ENCOUNTER — Ambulatory Visit (INDEPENDENT_AMBULATORY_CARE_PROVIDER_SITE_OTHER): Payer: BC Managed Care – PPO | Admitting: Gastroenterology

## 2019-10-28 VITALS — BP 113/74 | HR 71 | Temp 97.8°F | Ht 67.0 in | Wt 260.2 lb

## 2019-10-28 DIAGNOSIS — K219 Gastro-esophageal reflux disease without esophagitis: Secondary | ICD-10-CM

## 2019-10-28 DIAGNOSIS — K5900 Constipation, unspecified: Secondary | ICD-10-CM

## 2019-10-28 NOTE — Assessment & Plan Note (Addendum)
Chronic history of constipation.  Symptoms not adequately managed with Trulance, but she is only taking the medication 3-4 times a week due to forgetting.  Feels Linzess 72 mcg works better for her, but this has not been covered by insrance in the past. Reports Linzess is now covered, and she would like to resume Linzess. She already has a Rx at her pharmacy. No alarm symptoms. TCS up to date in December 2020, due for repeat between Dec 2025-2027.   Plan: Stop Trulance and resume Linzess 72 mcg daily 30 minutes before first meal. Counseled on the importance of taking medication every day for best results. Advised patient to let me know if she is unable to obtain Linzess.  We also gave her a coupon card today that she can get medication for $30 if it is not covered. Plan follow-up in 1 year.  Advised to call with any questions or concerns prior.

## 2019-10-28 NOTE — Assessment & Plan Note (Signed)
Chronic.  Fairly well controlled on Dexilant 60 mg daily.  Rare breakthrough symptoms.  No alarm symptoms.    Plan:  Continue Dexilant 60 mg daily.  Follow a GERD diet/lifestyle:   Avoid fried, fatty, greasy, spicy, citrus foods.  Avoid caffeine and carbonated beverages.  Avoid chocolate.  Try eating 4-6 small meals a day rather than 3 large meals.  Do not eat within 3 hours of laying down. Follow-up in 1 year or sooner if needed.

## 2019-10-28 NOTE — Telephone Encounter (Signed)
Pt was seen in office today. I called pts insurance plan to see if Linzess 72 mcg was covered under pts plan. Per [place], pt doesn't have prescription coverage for this insurance. Spoke with pt and she informed me that she now has tow insurance plans(hers and her spouse). Pt is going to notify me of a prescription card she has when she talks to her insurance company. Pt was given a Linzess copay card to activate and get a 90 day supply of Linzess for $30.00.

## 2019-10-28 NOTE — Patient Instructions (Signed)
Please stop Trulance and resume Linzess 72 mcg daily 30 minutes before your first meal as you feel this works better for you.   Please let us know if you have any trouble getting Linzess and we can resume Trulance.   It is important that you take the medication daily as prescribed. Please set an alarm on your phone as a reminder.   Continue Dexilant 60 mg daily for reflux.   Follow a GERD diet/lifestyle:  Avoid fried, fatty, greasy, spicy, citrus foods. Avoid caffeine and carbonated beverages. Avoid chocolate. Try eating 4-6 small meals a day rather than 3 large meals. Do not eat within 3 hours of laying down.  We will see you back in 1 year. Do not hesitate to call with questions or concerns prior.   Aliene Altes, PA-C [facility]

## 2019-11-01 NOTE — Progress Notes (Signed)
CC'ED TO PCP 

## 2019-11-02 DIAGNOSIS — K219 Gastro-esophageal reflux disease without esophagitis: Secondary | ICD-10-CM | POA: Diagnosis not present

## 2019-11-02 DIAGNOSIS — J329 Chronic sinusitis, unspecified: Secondary | ICD-10-CM | POA: Diagnosis not present

## 2019-11-02 DIAGNOSIS — I10 Essential (primary) hypertension: Secondary | ICD-10-CM | POA: Diagnosis not present

## 2019-11-02 DIAGNOSIS — Z6836 Body mass index (BMI) 36.0-36.9, adult: Secondary | ICD-10-CM | POA: Diagnosis not present

## 2019-11-07 ENCOUNTER — Ambulatory Visit (INDEPENDENT_AMBULATORY_CARE_PROVIDER_SITE_OTHER): Payer: BC Managed Care – PPO | Admitting: Family Medicine

## 2019-11-07 ENCOUNTER — Encounter: Payer: Self-pay | Admitting: Family Medicine

## 2019-11-07 VITALS — BP 126/69 | HR 65 | Ht 67.0 in | Wt 257.0 lb

## 2019-11-07 DIAGNOSIS — Z9989 Dependence on other enabling machines and devices: Secondary | ICD-10-CM

## 2019-11-07 DIAGNOSIS — G4733 Obstructive sleep apnea (adult) (pediatric): Secondary | ICD-10-CM

## 2019-11-07 NOTE — Patient Instructions (Addendum)
Please continue using your CPAP regularly. While your insurance requires that you use CPAP at least 4 hours each night on 70% of the nights, I recommend, that you not skip any nights and use it throughout the night if you can. Getting used to CPAP and staying with the treatment long term does take time and patience and discipline. Untreated obstructive sleep apnea when it is moderate to severe can have an adverse impact on cardiovascular health and raise her risk for heart disease, arrhythmias, hypertension, congestive heart failure, stroke and diabetes. Untreated obstructive sleep apnea causes sleep disruption, nonrestorative sleep, and sleep deprivation. This can have an impact on your day to day functioning and cause daytime sleepiness and impairment of cognitive function, memory loss, mood disturbance, and problems focussing. Using CPAP regularly can improve these symptoms.  You are doing much better. Please keep up the good work.   Follow up in 6 months    Sleep Apnea Sleep apnea affects breathing during sleep. It causes breathing to stop for a short time or to become shallow. It can also increase the risk of:  Heart attack.  Stroke.  Being very overweight (obese).  Diabetes.  Heart failure.  Irregular heartbeat. The goal of treatment is to help you breathe normally again. What are the causes? There are three kinds of sleep apnea:  Obstructive sleep apnea. This is caused by a blocked or collapsed airway.  Central sleep apnea. This happens when the brain does not send the right signals to the muscles that control breathing.  Mixed sleep apnea. This is a combination of obstructive and central sleep apnea. The most common cause of this condition is a collapsed or blocked airway. This can happen if:  Your throat muscles are too relaxed.  Your tongue and tonsils are too large.  You are overweight.  Your airway is too small. What increases the risk?  Being  overweight.  Smoking.  Having a small airway.  Being older.  Being female.  Drinking alcohol.  Taking medicines to calm yourself (sedatives or tranquilizers).  Having family members with the condition. What are the signs or symptoms?  Trouble staying asleep.  Being sleepy or tired during the day.  Getting angry a lot.  Loud snoring.  Headaches in the morning.  Not being able to focus your mind (concentrate).  Forgetting things.  Less interest in sex.  Mood swings.  Personality changes.  Feelings of sadness (depression).  Waking up a lot during the night to pee (urinate).  Dry mouth.  Sore throat. How is this diagnosed?  Your medical history.  A physical exam.  A test that is done when you are sleeping (sleep study). The test is most often done in a sleep lab but may also be done at home. How is this treated?   Sleeping on your side.  Using a medicine to get rid of mucus in your nose (decongestant).  Avoiding the use of alcohol, medicines to help you relax, or certain pain medicines (narcotics).  Losing weight, if needed.  Changing your diet.  Not smoking.  Using a machine to open your airway while you sleep, such as: ? An oral appliance. This is a mouthpiece that shifts your lower jaw forward. ? A CPAP device. This device blows air through a mask when you breathe out (exhale). ? An EPAP device. This has valves that you put in each nostril. ? A BPAP device. This device blows air through a mask when you breathe in (inhale) and  breathe out.  Having surgery if other treatments do not work. It is important to get treatment for sleep apnea. Without treatment, it can lead to:  High blood pressure.  Coronary artery disease.  In men, not being able to have an erection (impotence).  Reduced thinking ability. Follow these instructions at home: Lifestyle  Make changes that your doctor recommends.  Eat a healthy diet.  Lose weight if  needed.  Avoid alcohol, medicines to help you relax, and some pain medicines.  Do not use any products that contain nicotine or tobacco, such as cigarettes, e-cigarettes, and chewing tobacco. If you need help quitting, ask your doctor. General instructions  Take over-the-counter and prescription medicines only as told by your doctor.  If you were given a machine to use while you sleep, use it only as told by your doctor.  If you are having surgery, make sure to tell your doctor you have sleep apnea. You may need to bring your device with you.  Keep all follow-up visits as told by your doctor. This is important. Contact a doctor if:  The machine that you were given to use during sleep bothers you or does not seem to be working.  You do not get better.  You get worse. Get help right away if:  Your chest hurts.  You have trouble breathing in enough air.  You have an uncomfortable feeling in your back, arms, or stomach.  You have trouble talking.  One side of your body feels weak.  A part of your face is hanging down. These symptoms may be an emergency. Do not wait to see if the symptoms will go away. Get medical help right away. Call your local emergency services (911 in the [place] Do not drive yourself to the hospital. Summary  This condition affects breathing during sleep.  The most common cause is a collapsed or blocked airway.  The goal of treatment is to help you breathe normally while you sleep. This information is not intended to replace advice given to you by your health care provider. Make sure you discuss any questions you have with your health care provider. Document Revised: 10/09/2017 Document Reviewed: 08/18/2017 [employer] Patient Education  [place].
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
## 2019-11-08 ENCOUNTER — Telehealth: Payer: Self-pay | Admitting: Family Medicine

## 2019-11-08 NOTE — Telephone Encounter (Signed)
Order faxed for DME to [facility] [phone]. Received confirmation received.

## 2019-11-08 NOTE — Telephone Encounter (Signed)
Mateo Flow from [facility] called stating they received a order for supplies from [employer] however, the order was not signed. They are asking if order can be sent back over and just sign and date it for them. Please advise.

## 2019-11-08 NOTE — Telephone Encounter (Signed)
Order reprinted and signed.

## 2019-11-08 NOTE — Progress Notes (Signed)
Order for cpap supplies sent to [facility] via fax. Confirmation received that the order transmitted was successful.

## 2019-11-29 DIAGNOSIS — M79672 Pain in left foot: Secondary | ICD-10-CM | POA: Diagnosis not present

## 2019-11-29 DIAGNOSIS — M76822 Posterior tibial tendinitis, left leg: Secondary | ICD-10-CM | POA: Diagnosis not present

## 2019-12-28 ENCOUNTER — Other Ambulatory Visit: Payer: Self-pay | Admitting: Adult Health

## 2020-01-04 DIAGNOSIS — Z1231 Encounter for screening mammogram for malignant neoplasm of breast: Secondary | ICD-10-CM | POA: Diagnosis not present

## 2020-01-11 ENCOUNTER — Other Ambulatory Visit: Payer: Self-pay

## 2020-01-11 ENCOUNTER — Encounter: Payer: Self-pay | Admitting: Internal Medicine

## 2020-01-11 ENCOUNTER — Ambulatory Visit (INDEPENDENT_AMBULATORY_CARE_PROVIDER_SITE_OTHER): Payer: BC Managed Care – PPO | Admitting: Internal Medicine

## 2020-01-11 VITALS — BP 132/74 | HR 65 | Temp 96.8°F | Ht 67.0 in | Wt 260.4 lb

## 2020-01-11 DIAGNOSIS — K219 Gastro-esophageal reflux disease without esophagitis: Secondary | ICD-10-CM | POA: Diagnosis not present

## 2020-01-11 DIAGNOSIS — Z01818 Encounter for other preprocedural examination: Secondary | ICD-10-CM | POA: Diagnosis not present

## 2020-01-11 DIAGNOSIS — K5900 Constipation, unspecified: Secondary | ICD-10-CM

## 2020-01-11 DIAGNOSIS — K625 Hemorrhage of anus and rectum: Secondary | ICD-10-CM | POA: Diagnosis not present

## 2020-01-11 DIAGNOSIS — R0789 Other chest pain: Secondary | ICD-10-CM | POA: Diagnosis not present

## 2020-01-11 DIAGNOSIS — R0602 Shortness of breath: Secondary | ICD-10-CM | POA: Diagnosis not present

## 2020-01-11 DIAGNOSIS — Z0189 Encounter for other specified special examinations: Secondary | ICD-10-CM | POA: Diagnosis not present

## 2020-01-11 MED ORDER — HYDROCORTISONE (PERIANAL) 2.5 % EX CREA: 1.0000 "application " | TOPICAL_CREAM | Freq: Two times a day (BID) | CUTANEOUS | 1 refills | Status: DC

## 2020-01-11 MED ORDER — LINACLOTIDE 72 MCG PO CAPS: 72.0000 ug | ORAL_CAPSULE | Freq: Every day | ORAL | 5 refills | Status: DC

## 2020-01-11 NOTE — Patient Instructions (Signed)
We will check blood counts today to monitor your hemoglobin.  Continue to watch for worsening bleeding.  If you have further episodes, please call our office.  If you experience any chest pain, fatigue, shortness of breath, weakness, I would recommend you go to the emergency room for evaluation.  I refilled your Linzess for chronic constipation.  I am also going to send you in a rectal cream for hemorrhoids.  Follow-up in 6 weeks or sooner if needed.  At [facility] we value your feedback. You may receive a survey about your visit today. Please share your experience as we strive to create trusting relationships with our patients to provide genuine, compassionate, quality care.  We appreciate your understanding and patience as we review any laboratory studies, imaging, and other diagnostic tests that are ordered as we care for you. Our office policy is 5 business days for review of these results, and any emergent or urgent results are addressed in a timely manner for your best interest. If you do not hear from our office in 1 week, please contact us.   We also encourage the use of MyChart, which contains your medical information for your review as well. If you are not enrolled in this feature, an access code is on this after visit summary for your convenience. Thank you for allowing [place] to be involved in your care.  It was great to see you today!  I hope you have a great rest of your winter!!    Hennie Duos. Marletta Lor, D.O. Gastroenterology and Hepatology [facility]

## 2020-01-12 ENCOUNTER — Other Ambulatory Visit: Payer: Self-pay | Admitting: Gastroenterology

## 2020-01-12 ENCOUNTER — Ambulatory Visit (INDEPENDENT_AMBULATORY_CARE_PROVIDER_SITE_OTHER): Payer: BC Managed Care – PPO | Admitting: Student

## 2020-01-12 ENCOUNTER — Encounter: Payer: Self-pay | Admitting: Student

## 2020-01-12 VITALS — BP 100/78 | HR 81 | Wt 258.0 lb

## 2020-01-12 DIAGNOSIS — I2510 Atherosclerotic heart disease of native coronary artery without angina pectoris: Secondary | ICD-10-CM | POA: Diagnosis not present

## 2020-01-12 DIAGNOSIS — E785 Hyperlipidemia, unspecified: Secondary | ICD-10-CM

## 2020-01-12 DIAGNOSIS — I10 Essential (primary) hypertension: Secondary | ICD-10-CM

## 2020-01-12 DIAGNOSIS — I480 Paroxysmal atrial fibrillation: Secondary | ICD-10-CM

## 2020-01-12 LAB — CBC WITH DIFFERENTIAL/PLATELET
Basophils Absolute: 0 10*3/uL (ref 0.0–0.2)
Basos: 1 %
EOS (ABSOLUTE): 0.2 10*3/uL (ref 0.0–0.4)
Eos: 2 %
Hematocrit: 39.3 % (ref 34.0–46.6)
Hemoglobin: 13.4 g/dL (ref 11.1–15.9)
Immature Grans (Abs): 0 10*3/uL (ref 0.0–0.1)
Immature Granulocytes: 0 %
Lymphocytes Absolute: 2.9 10*3/uL (ref 0.7–3.1)
Lymphs: 38 %
MCH: 31.5 pg (ref 26.6–33.0)
MCHC: 34.1 g/dL (ref 31.5–35.7)
MCV: 92 fL (ref 79–97)
Monocytes Absolute: 0.7 10*3/uL (ref 0.1–0.9)
Monocytes: 10 %
Neutrophils Absolute: 3.6 10*3/uL (ref 1.4–7.0)
Neutrophils: 49 %
Platelets: 382 10*3/uL (ref 150–450)
RBC: 4.26 x10E6/uL (ref 3.77–5.28)
RDW: 12.6 % (ref 11.7–15.4)
WBC: 7.4 10*3/uL (ref 3.4–10.8)

## 2020-01-12 MED ORDER — OLMESARTAN MEDOXOMIL 20 MG PO TABS: 20.0000 mg | ORAL_TABLET | Freq: Every day | ORAL | 3 refills | Status: DC

## 2020-01-12 MED ORDER — METOPROLOL TARTRATE 25 MG PO TABS: 25.0000 mg | ORAL_TABLET | Freq: Two times a day (BID) | ORAL | 3 refills | Status: DC
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
## 2020-01-12 NOTE — Telephone Encounter (Signed)
Please verify patient is taking Dexilant for GERD and not Protonix. Received refill request for Protonix.

## 2020-01-12 NOTE — Patient Instructions (Signed)
Medication Instructions:   Decrease Olmesartan to 20mg  daily (can cut current tablets in half).   Start taking Lopressor 25mg  twice daily (can take once daily in the morning if this helps with symptoms).   Labwork:  TSH and BMET in 1 week if symptoms do not improve.   Testing/Procedures:  None  Follow-Up:  2-3 months with , PA-C or Dr.  Any Other Special Instructions Will Be Listed Below (If Applicable).     If you need a refill on your cardiac medications before your next appointment, please call your pharmacy.
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
## 2020-01-13 NOTE — Telephone Encounter (Signed)
Phoned and spoke with the pt she is taking Dexilant for her GERD and not Protonix.

## 2020-01-14 ENCOUNTER — Emergency Department (HOSPITAL_COMMUNITY): Payer: BC Managed Care – PPO

## 2020-01-14 ENCOUNTER — Emergency Department (HOSPITAL_COMMUNITY)
Admission: EM | Admit: 2020-01-14 | Discharge: 2020-01-14 | Disposition: A | Discharge status: Home / Self Care | Payer: BC Managed Care – PPO | Attending: Emergency Medicine | Admitting: Emergency Medicine

## 2020-01-14 ENCOUNTER — Other Ambulatory Visit: Payer: Self-pay

## 2020-01-14 ENCOUNTER — Encounter (HOSPITAL_COMMUNITY): Payer: Self-pay | Admitting: Emergency Medicine

## 2020-01-14 DIAGNOSIS — E039 Hypothyroidism, unspecified: Secondary | ICD-10-CM | POA: Diagnosis not present

## 2020-01-14 DIAGNOSIS — J45909 Unspecified asthma, uncomplicated: Secondary | ICD-10-CM | POA: Insufficient documentation

## 2020-01-14 DIAGNOSIS — I2510 Atherosclerotic heart disease of native coronary artery without angina pectoris: Secondary | ICD-10-CM | POA: Insufficient documentation

## 2020-01-14 DIAGNOSIS — R0602 Shortness of breath: Secondary | ICD-10-CM | POA: Diagnosis not present

## 2020-01-14 DIAGNOSIS — I119 Hypertensive heart disease without heart failure: Secondary | ICD-10-CM | POA: Diagnosis not present

## 2020-01-14 DIAGNOSIS — E119 Type 2 diabetes mellitus without complications: Secondary | ICD-10-CM | POA: Diagnosis not present

## 2020-01-14 DIAGNOSIS — K922 Gastrointestinal hemorrhage, unspecified: Secondary | ICD-10-CM | POA: Diagnosis not present

## 2020-01-14 DIAGNOSIS — R1031 Right lower quadrant pain: Secondary | ICD-10-CM | POA: Diagnosis not present

## 2020-01-14 DIAGNOSIS — Z79899 Other long term (current) drug therapy: Secondary | ICD-10-CM | POA: Diagnosis not present

## 2020-01-14 DIAGNOSIS — Z7901 Long term (current) use of anticoagulants: Secondary | ICD-10-CM | POA: Insufficient documentation

## 2020-01-14 DIAGNOSIS — K625 Hemorrhage of anus and rectum: Secondary | ICD-10-CM | POA: Insufficient documentation

## 2020-01-14 DIAGNOSIS — Z87891 Personal history of nicotine dependence: Secondary | ICD-10-CM | POA: Diagnosis not present

## 2020-01-14 DIAGNOSIS — I480 Paroxysmal atrial fibrillation: Secondary | ICD-10-CM | POA: Diagnosis not present

## 2020-01-14 DIAGNOSIS — R109 Unspecified abdominal pain: Secondary | ICD-10-CM | POA: Diagnosis not present

## 2020-01-14 LAB — COMPREHENSIVE METABOLIC PANEL
ALT: 20 U/L (ref 0–44)
AST: 16 U/L (ref 15–41)
Albumin: 4.2 g/dL (ref 3.5–5.0)
Alkaline Phosphatase: 58 U/L (ref 38–126)
Anion gap: 8 (ref 5–15)
BUN: 15 mg/dL (ref 6–20)
CO2: 27 mmol/L (ref 22–32)
Calcium: 9.8 mg/dL (ref 8.9–10.3)
Chloride: 103 mmol/L (ref 98–111)
Creatinine, Ser: 1 mg/dL (ref 0.44–1.00)
GFR, Estimated: >60 mL/min (ref >60)
Glucose, Bld: 142 mg/dL — ABNORMAL HIGH (ref 70–99)
Potassium: 3.5 mmol/L (ref 3.5–5.1)
Sodium: 138 mmol/L (ref 135–145)
Total Bilirubin: 0.6 mg/dL (ref 0.3–1.2)
Total Protein: 7.2 g/dL (ref 6.5–8.1)

## 2020-01-14 LAB — CBC
HCT: 39.1 % (ref 36.0–46.0)
Hemoglobin: 12.6 g/dL (ref 12.0–15.0)
MCH: 30.9 pg (ref 26.0–34.0)
MCHC: 32.2 g/dL (ref 30.0–36.0)
MCV: 95.8 fL (ref 80.0–100.0)
Platelets: 399 10*3/uL (ref 150–400)
RBC: 4.08 MIL/uL (ref 3.87–5.11)
RDW: 12.5 % (ref 11.5–15.5)
WBC: 6.2 10*3/uL (ref 4.0–10.5)
nRBC: 0 % (ref 0.0–0.2)

## 2020-01-14 LAB — TYPE AND SCREEN
ABO/RH(D): O POS
Antibody Screen: NEGATIVE

## 2020-01-14 LAB — POC OCCULT BLOOD, ED: Fecal Occult Bld: POSITIVE — AB

## 2020-01-14 MED ORDER — IOHEXOL 300 MG/ML  SOLN
100.0000 mL | Freq: Once | INTRAMUSCULAR | Status: AC | PRN
  Administered 2020-01-14: 100 mL via INTRAVENOUS

## 2020-01-14 NOTE — ED Triage Notes (Addendum)
Patient c/o intermittent blood in stools since last Thursday. Per patient dark blood that changes water in toilet. Per patient has had this in past and had a small tear and hemorrhoids x1 year ago. Patient states seen at gastroenterologist and had blood work done, denies having rectal exam. Patient starting to feel lightheaded now and was told to come to ER. Denies any abd pain but does state some nausea.

## 2020-01-14 NOTE — ED Notes (Signed)
Pt ambulated around nurses station with steady gait. Audible SOB while ambulating. Oxygen levels 96-100% on RA, HR 80-90 bpm. Only c/o SOB.
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
## 2020-01-14 NOTE — Discharge Instructions (Addendum)
You were seen in the emergency department for rectal bleeding  On rectal exam today you had some irritation of the anal skin but I did not see any tears, cuts or hemorrhoids.  Your stool was dark brown/burgundy.  Hemoglobin was normal.  CT scan showed diverticulosis (pockets in the colon) but no diverticulitis (infection) or any other concerning findings  Your bleeding may be from diverticular/diverticulosis bleeding or hemorrhoid  Continue all your medicines including Xarelto  Since you are on Xarelto yard increased risk for continued and larger amounts of bleeding  Return to the ED for increased bleeding, hemorrhage, lightheadedness, passing out, chest pain, shortness of breath  Please call your gastroenterologist on Monday and schedule follow-up for recheck next week  Please call your cardiologist as well if you continue to have episodes of lightheadedness or if you develop any symptoms that may be suggestive of going in and out of atrial fibrillation

## 2020-01-16 ENCOUNTER — Telehealth: Payer: Self-pay

## 2020-01-16 ENCOUNTER — Other Ambulatory Visit: Payer: Self-pay

## 2020-01-16 ENCOUNTER — Telehealth: Payer: Self-pay | Admitting: Internal Medicine

## 2020-01-16 DIAGNOSIS — K625 Hemorrhage of anus and rectum: Secondary | ICD-10-CM

## 2020-01-16 NOTE — Telephone Encounter (Signed)
Dear [name]   We have a mutual patient by the name of Becky [name] (DOB 1960-03-11). Dr. Abbey Chatters wants to know if it's ok for the patient to stop taking Xarelto until we can get the patient in for a urgent colonoscopy to further evaluate her bleeding.    Thank you, Floria Raveling, CMA

## 2020-01-16 NOTE — Telephone Encounter (Signed)
noted 

## 2020-01-16 NOTE — Telephone Encounter (Signed)
Can we order repeat CBC to be checked in the lab.  Can we also reach out to patient's cardiologist about potentially stopping her Xarelto until we can get patient in for urgent colonoscopy to further evaluate.  Looks like she just recently saw [place].  Thank you

## 2020-01-16 NOTE — Telephone Encounter (Signed)
Noted   2. Lab form for CBC faxed to [facility]   3. Note sent to [place] for clearance to stop Xarelto.  4. Faxed lab form to [facility]  and LM on vm of the pt regarding getting her labs done @ [facility].

## 2020-01-16 NOTE — Telephone Encounter (Signed)
This pt was seen in the ER on Saturday the 8th for hemorrhoid bleeding and feeling light headed when she stood up. (she was evaluated). The pt called this morning to say she is still having the bleeding but no stool. When she goes to the bathroom she feels pressure and once the blood gushes out the pressure leaves (x 2 this am). She is not in any pain at this time and not lightheaded. She is actually in the parking lot at the ER now with her husband waiting to be seen. Please advise.

## 2020-01-16 NOTE — Telephone Encounter (Signed)
[phone] please call patient, she went to the er because of her hemorrhoids.  Still having the same issues

## 2020-01-17 ENCOUNTER — Encounter: Payer: Self-pay | Admitting: *Deleted

## 2020-01-17 ENCOUNTER — Telehealth: Payer: Self-pay

## 2020-01-17 ENCOUNTER — Telehealth: Payer: Self-pay | Admitting: Internal Medicine

## 2020-01-17 MED ORDER — PEG 3350-KCL-NA BICARB-NACL 420 G PO SOLR: ORAL | 0 refills | Status: DC

## 2020-01-17 NOTE — Telephone Encounter (Signed)
Called pt. She has been scheduled for 2/1 at 3:00pm. Aware will place instructions for pick up with her pre-op/covid test appt. She states she will be dropping off FMLA paperwork to have procedure done. Fowarding to Dr. Abbey Chatters as an Juluis Rainier

## 2020-01-17 NOTE — Telephone Encounter (Signed)
1 phoned the pt advised to stop her Xarelto for now so a stat colonoscopy can be scheduled for her. She agreed   2. Dr. Abbey Chatters wants to get a urgent colonoscopy on this patient. She has already been advised of her Xarelto and she has clearance from her Dr as well.

## 2020-01-17 NOTE — Telephone Encounter (Signed)
Noted  

## 2020-01-17 NOTE — Telephone Encounter (Signed)
We don't have any orders for patient. Please advise Dr. Abbey Chatters with ASA type. thanks

## 2020-01-17 NOTE — Telephone Encounter (Signed)
PATIENT DROPPED OFF FMLA PAPERWORK FOR HERSELF AND CARETAKER

## 2020-01-17 NOTE — Addendum Note (Signed)
Addended by: Cheron Every on: 01/17/2020 01:14 PM   Modules accepted: Orders

## 2020-01-17 NOTE — Telephone Encounter (Signed)
Thank you for forwarding me this.  Please let patient know to hold her Xarelto for now.  Can we get her in for an urgent colonoscopy.  If this will not be for a while then we will need to consider restarting her Xarelto in the interim after her bleeding subsides.  Thank you

## 2020-01-17 NOTE — Telephone Encounter (Signed)
ASA III 

## 2020-01-17 NOTE — Telephone Encounter (Signed)
Pt has been approved to stop her Xarelto prior to her colonoscopy date by her Physician (Cardiac). This should be in her telephone notes as well.

## 2020-01-17 NOTE — Telephone Encounter (Signed)
    If she is continuing to have active bleeding, then the risks of being on Xarelto outweigh the benefits at this time. Would hold for now until bleeding resolves. If colonoscopy is not going to be for several weeks, would need to readdress restarting in the interim if bleeding resolves then holding for 48 hours prior to her procedure.   Will copy Dr. Harl Bowie as well to make him aware and to see if he has any additional recommendations.   Signed, Erma Heritage, PA-C 01/17/2020, 8:28 AM Pager: [phone]

## 2020-01-17 NOTE — Telephone Encounter (Signed)
Good morning!!!!  We have documentation for Becky [name] regarding her Xarelto. Let me know if you want to proceed with colonoscopy so I can forward this information to [facility].

## 2020-01-18 ENCOUNTER — Telehealth: Payer: Self-pay | Admitting: Internal Medicine

## 2020-01-18 NOTE — Telephone Encounter (Signed)
Paperwork for [employer] has been completed. I have called the pts caregiver Ethelene Hal) to see what days she needed on her FMLA paperwork. No answer, LMOM to return call.

## 2020-01-18 NOTE — Telephone Encounter (Signed)
Patient scheduled for procedure 2/1. She states she was told to go ahead and start holding her xarelto now. She wants to know if she should restart since procedure is not until 2/1

## 2020-01-18 NOTE — Telephone Encounter (Signed)
Pt is scheduled procedure on 2/1 and has questions about how long to hold her xarelto. Please advise. [phone]

## 2020-01-19 NOTE — Telephone Encounter (Signed)
Spoke with Becky [name], she only needs the day before procedure, procedure date and the day after, just in case they find something wrong with her mother. FMLA paperwork is done and at the front with Manuela Schwartz.

## 2020-01-19 NOTE — Telephone Encounter (Signed)
Dr. Abbey Chatters This pt's procedure is 2t weeks away. Did you want her to hold her Xarelto now or wait

## 2020-01-19 NOTE — Telephone Encounter (Signed)
Just tried to call patient multiple times.  She is not answering the phone.  If she is still bleeding, I would recommend she continue to hold her Xarelto.  If the bleeding has subsided for a few days she can then restart it until her procedure date.  Thank you

## 2020-01-19 NOTE — Telephone Encounter (Signed)
Patient called back checking on her message.

## 2020-01-20 NOTE — Telephone Encounter (Signed)
noted 

## 2020-01-20 NOTE — Telephone Encounter (Signed)
LMOVM for pt 

## 2020-01-20 NOTE — Telephone Encounter (Signed)
Patient called back. She states she is still currently bleeding. Aware of Dr. Ave Filter recs. She voiced understanding

## 2020-01-20 NOTE — Telephone Encounter (Signed)
FMLA papers are in drawer up front for her and another set for her daughter

## 2020-01-27 ENCOUNTER — Other Ambulatory Visit: Payer: Self-pay | Admitting: Cardiology

## 2020-01-27 DIAGNOSIS — G4733 Obstructive sleep apnea (adult) (pediatric): Secondary | ICD-10-CM | POA: Diagnosis not present

## 2020-01-27 NOTE — Telephone Encounter (Signed)
32f, 117kg, scr1.00 01/14/20, lovw/strader 01/12/20

## 2020-01-31 DIAGNOSIS — M76822 Posterior tibial tendinitis, left leg: Secondary | ICD-10-CM | POA: Diagnosis not present

## 2020-01-31 DIAGNOSIS — M79672 Pain in left foot: Secondary | ICD-10-CM | POA: Diagnosis not present

## 2020-01-31 DIAGNOSIS — M722 Plantar fascial fibromatosis: Secondary | ICD-10-CM | POA: Diagnosis not present

## 2020-01-31 NOTE — Patient Instructions (Signed)
Becky [name]  01/31/2020     @PREFPERIOPPHARMACY @   Your procedure is scheduled on  02/07/2020.   Report to [facility] at  1300 (1:00) PM   Call this number if you have problems the morning of surgery:  [phone]      Follow the diet and prep instructions given to you by the office.                      Take these medicines the morning of surgery with A SIP OF WATER               Armour thyroid, dexilant, diltiazem, metoprolol, singulair, zofran(if needed).   Please brush your teeth.  Do not wear jewelry, make-up or nail polish.  Do not wear lotions, powders, or perfumes, or deodorant.  Do not shave 48 hours prior to surgery.  Men may shave face and neck.  Do not bring valuables to the hospital.  [facility] is not responsible for any belongings or valuables.  Contacts, dentures or bridgework may not be worn into surgery.  Leave your suitcase in the car.  After surgery it may be brought to your room.  For patients admitted to the hospital, discharge time will be determined by your treatment team.  Patients discharged the day of surgery will not be allowed to drive home and need someone to stay with them for 24 hours.   Special instructions:  DO NOT smoke (tobacco or vape) the morning of your procedure.  Please read over the following fact sheets that you were given. Anesthesia Post-op Instructions and Care and Recovery After Surgery       Colonoscopy, Adult, Care After This sheet gives you information about how to care for yourself after your procedure. Your health care provider may also give you more specific instructions. If you have problems or questions, contact your health care provider. What can I expect after the procedure? After the procedure, it is common to have:  A small amount of blood in your stool for 24 hours after the procedure.  Some gas.  Mild cramping or bloating of your abdomen. Follow these instructions at home: Eating and  drinking  Drink enough fluid to keep your urine pale yellow.  Follow instructions from your health care provider about eating or drinking restrictions.  Resume your normal diet as instructed by your health care provider. Avoid heavy or fried foods that are hard to digest.   Activity  Rest as told by your health care provider.  Avoid sitting for a long time without moving. Get up to take short walks every 1-2 hours. This is important to improve blood flow and breathing. Ask for help if you feel weak or unsteady.  Return to your normal activities as told by your health care provider. Ask your health care provider what activities are safe for you. Managing cramping and bloating  Try walking around when you have cramps or feel bloated.  Apply heat to your abdomen as told by your health care provider. Use the heat source that your health care provider recommends, such as a moist heat pack or a heating pad. ? Place a towel between your skin and the heat source. ? Leave the heat on for 20-30 minutes. ? Remove the heat if your skin turns bright red. This is especially important if you are unable to feel pain, heat, or cold. You may have a greater risk of getting burned.  General instructions  If you were given a sedative during the procedure, it can affect you for several hours. Do not drive or operate machinery until your health care provider says that it is safe.  For the first 24 hours after the procedure: ? Do not sign important documents. ? Do not drink alcohol. ? Do your regular daily activities at a slower pace than normal. ? Eat soft foods that are easy to digest.  Take over-the-counter and prescription medicines only as told by your health care provider.  Keep all follow-up visits as told by your health care provider. This is important. Contact a health care provider if:  You have blood in your stool 2-3 days after the procedure. Get help right away if you have:  More than a  small spotting of blood in your stool.  Large blood clots in your stool.  Swelling of your abdomen.  Nausea or vomiting.  A fever.  Increasing pain in your abdomen that is not relieved with medicine. Summary  After the procedure, it is common to have a small amount of blood in your stool. You may also have mild cramping and bloating of your abdomen.  If you were given a sedative during the procedure, it can affect you for several hours. Do not drive or operate machinery until your health care provider says that it is safe.  Get help right away if you have a lot of blood in your stool, nausea or vomiting, a fever, or increased pain in your abdomen. This information is not intended to replace advice given to you by your health care provider. Make sure you discuss any questions you have with your health care provider. Document Revised: 12/17/2018 Document Reviewed: 07/19/2018 Elsevier Patient Education  2021 [place] After This sheet gives you information about how to care for yourself after your procedure. Your health care provider may also give you more specific instructions. If you have problems or questions, contact your health care provider. What can I expect after the procedure? After the procedure, it is common to have:  Tiredness.  Forgetfulness about what happened after the procedure.  Impaired judgment for important decisions.  Nausea or vomiting.  Some difficulty with balance. Follow these instructions at home: For the time period you were told by your health care provider:  Rest as needed.  Do not participate in activities where you could fall or become injured.  Do not drive or use machinery.  Do not drink alcohol.  Do not take sleeping pills or medicines that cause drowsiness.  Do not make important decisions or sign legal documents.  Do not take care of children on your own.      Eating and drinking  Follow the  diet that is recommended by your health care provider.  Drink enough fluid to keep your urine pale yellow.  If you vomit: ? Drink water, juice, or soup when you can drink without vomiting. ? Make sure you have little or no nausea before eating solid foods. General instructions  Have a responsible adult stay with you for the time you are told. It is important to have someone help care for you until you are awake and alert.  Take over-the-counter and prescription medicines only as told by your health care provider.  If you have sleep apnea, surgery and certain medicines can increase your risk for breathing problems. Follow instructions from your health care provider about wearing your sleep device: ? Anytime you are  sleeping, including during daytime naps. ? While taking prescription pain medicines, sleeping medicines, or medicines that make you drowsy.  Avoid smoking.  Keep all follow-up visits as told by your health care provider. This is important. Contact a health care provider if:  You keep feeling nauseous or you keep vomiting.  You feel light-headed.  You are still sleepy or having trouble with balance after 24 hours.  You develop a rash.  You have a fever.  You have redness or swelling around the IV site. Get help right away if:  You have trouble breathing.  You have new-onset confusion at home. Summary  For several hours after your procedure, you may feel tired. You may also be forgetful and have poor judgment.  Have a responsible adult stay with you for the time you are told. It is important to have someone help care for you until you are awake and alert.  Rest as told. Do not drive or operate machinery. Do not drink alcohol or take sleeping pills.  Get help right away if you have trouble breathing, or if you suddenly become confused. This information is not intended to replace advice given to you by your health care provider. Make sure you discuss any questions  you have with your health care provider. Document Revised: 09/08/2019 Document Reviewed: 11/25/2018 Elsevier Patient Education  2021 [employer].

## 2020-02-02 ENCOUNTER — Ambulatory Visit
Admission: EM | Admit: 2020-02-02 | Discharge: 2020-02-02 | Disposition: A | Discharge status: Home / Self Care | Payer: BC Managed Care – PPO | Attending: Emergency Medicine | Admitting: Emergency Medicine

## 2020-02-02 ENCOUNTER — Other Ambulatory Visit: Payer: Self-pay

## 2020-02-02 ENCOUNTER — Encounter: Payer: Self-pay | Admitting: Emergency Medicine

## 2020-02-02 DIAGNOSIS — Z20822 Contact with and (suspected) exposure to covid-19: Secondary | ICD-10-CM | POA: Diagnosis not present

## 2020-02-02 DIAGNOSIS — U071 COVID-19: Secondary | ICD-10-CM

## 2020-02-02 DIAGNOSIS — J0140 Acute pansinusitis, unspecified: Secondary | ICD-10-CM | POA: Diagnosis not present

## 2020-02-02 MED ORDER — FLUTICASONE PROPIONATE 50 MCG/ACT NA SUSP: 2.0000 | Freq: Every day | NASAL | 0 refills | Status: DC

## 2020-02-02 NOTE — ED Triage Notes (Signed)
Headache, coughing, sneezing, and ear pain since Monday.  Took a home covid test on Tuesday and it was negative.
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
## 2020-02-02 NOTE — Discharge Instructions (Addendum)
COVID, flu will be back in several days.  Take the medication as written. Start Mucinex to keep the mucous thin and to decongest you.  Return to the ER if you get worse, have a fever >100.4, or for any concerns. You may take  1 gram of tylenol up to 3-4 times a day as needed for pain.   Most sinus infections are viral and do not need antibiotics unless you have a high fever, have had this for 10 days, or you get better and then get sick again. Use a NeilMed sinus rinse as often as you want to to reduce nasal congestion. Follow the directions on the box.   Go to www.goodrx.com to look up your medications. This will give you a list of where you can find your prescriptions at the most affordable prices. Or you can ask the pharmacist what the cash price is. This is frequently cheaper than going through insurance.

## 2020-02-03 ENCOUNTER — Telehealth: Payer: Self-pay | Admitting: Internal Medicine

## 2020-02-03 ENCOUNTER — Encounter (HOSPITAL_COMMUNITY)
Admission: RE | Admit: 2020-02-03 | Discharge: 2020-02-03 | Disposition: A | Discharge status: Home / Self Care | Payer: BC Managed Care – PPO | Source: Ambulatory Visit | Attending: Internal Medicine | Admitting: Internal Medicine

## 2020-02-03 ENCOUNTER — Encounter (HOSPITAL_COMMUNITY): Payer: Self-pay

## 2020-02-03 ENCOUNTER — Other Ambulatory Visit (HOSPITAL_COMMUNITY): Payer: BC Managed Care – PPO | Attending: Internal Medicine

## 2020-02-03 LAB — COVID-19, FLU A+B NAA
Influenza A, NAA: NOT DETECTED
Influenza B, NAA: NOT DETECTED
SARS-CoV-2, NAA: DETECTED — AB

## 2020-02-03 NOTE — Telephone Encounter (Signed)
Young SHE MAY HAVE COVID AND WILL NOT GET HER TEST BACK FOR A FEW DAYS

## 2020-02-03 NOTE — Telephone Encounter (Signed)
LMOVM for pt Also received a call from hospital patient did not show up for covid/pre-op appt.

## 2020-02-03 NOTE — Telephone Encounter (Signed)
Received call from patient. She was tested for covid last night but has not received results. Patient procedure for Tuesday has been cancelled. When she is feeling better she is going to call back to get her procedure rescheduled

## 2020-02-04 ENCOUNTER — Telehealth: Payer: Self-pay | Admitting: Emergency Medicine

## 2020-02-04 MED ORDER — BENZONATATE 100 MG PO CAPS: 100.0000 mg | ORAL_CAPSULE | ORAL | 0 refills | Status: DC | PRN

## 2020-02-04 NOTE — Telephone Encounter (Signed)
Patient tested positive for Covid] and would like cough medication to be prescribed.  Tessalon was sent to the pharmacy on file.

## 2020-02-05 ENCOUNTER — Telehealth (HOSPITAL_COMMUNITY): Payer: Self-pay | Admitting: Emergency Medicine

## 2020-02-05 DIAGNOSIS — U071 COVID-19: Secondary | ICD-10-CM

## 2020-02-05 NOTE — Telephone Encounter (Signed)
COVID positive. Placing referral to covid treatment team due to multiple medical comorbidities.  AM

## 2020-02-06 ENCOUNTER — Encounter: Payer: Self-pay | Admitting: Emergency Medicine

## 2020-02-06 ENCOUNTER — Other Ambulatory Visit: Payer: Self-pay

## 2020-02-06 ENCOUNTER — Ambulatory Visit
Admission: EM | Admit: 2020-02-06 | Discharge: 2020-02-06 | Disposition: A | Discharge status: Home / Self Care | Payer: BC Managed Care – PPO | Attending: Family Medicine | Admitting: Family Medicine

## 2020-02-06 DIAGNOSIS — R059 Cough, unspecified: Secondary | ICD-10-CM | POA: Diagnosis not present

## 2020-02-06 DIAGNOSIS — U071 COVID-19: Secondary | ICD-10-CM

## 2020-02-06 DIAGNOSIS — R0981 Nasal congestion: Secondary | ICD-10-CM

## 2020-02-06 MED ORDER — PROMETHAZINE-DM 6.25-15 MG/5ML PO SYRP: 5.0000 mL | ORAL_SOLUTION | Freq: Four times a day (QID) | ORAL | 0 refills | Status: DC | PRN

## 2020-02-06 NOTE — ED Triage Notes (Signed)
Headache, cough,  covid positive

## 2020-02-06 NOTE — Discharge Instructions (Addendum)
Continue with mucinex, increase fluids, take ibuprofen and tylenol as needed for aches and fevers.  I have sent in cough syrup for you to take. This medication can make you sleepy. Do not drive while taking this medication.  Follow up with this office or with primary care if symptoms are persisting.  Follow up in the ER for high fever, trouble swallowing, trouble breathing, other concerning symptoms.
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
## 2020-02-07 ENCOUNTER — Encounter (HOSPITAL_COMMUNITY): Admission: RE | Payer: Self-pay | Source: Home / Self Care

## 2020-02-07 ENCOUNTER — Ambulatory Visit (HOSPITAL_COMMUNITY): Admission: RE | Admit: 2020-02-07 | Payer: BC Managed Care – PPO | Source: Home / Self Care

## 2020-02-07 SURGERY — COLONOSCOPY WITH PROPOFOL
Anesthesia: Monitor Anesthesia Care

## 2020-02-09 DIAGNOSIS — U071 COVID-19: Secondary | ICD-10-CM | POA: Diagnosis not present

## 2020-02-09 DIAGNOSIS — Z681 Body mass index (BMI) 19 or less, adult: Secondary | ICD-10-CM | POA: Diagnosis not present

## 2020-02-09 DIAGNOSIS — J22 Unspecified acute lower respiratory infection: Secondary | ICD-10-CM | POA: Diagnosis not present

## 2020-02-14 ENCOUNTER — Other Ambulatory Visit: Payer: Self-pay | Admitting: Gastroenterology

## 2020-02-14 ENCOUNTER — Other Ambulatory Visit: Payer: Self-pay | Admitting: Family Medicine

## 2020-02-15 NOTE — Telephone Encounter (Signed)
Ok I will call later. She works 3rd shift @  [employer].

## 2020-02-15 NOTE — Telephone Encounter (Signed)
Phoned the pt and she advised me that she takes Dexilant because the Protonix caused her some pain and she didn't like it.

## 2020-02-15 NOTE — Telephone Encounter (Signed)
Becky [name], patient has request for Dexilant and Protonix. Can we find out what she is taking?

## 2020-02-15 NOTE — Telephone Encounter (Signed)
noted 

## 2020-02-22 ENCOUNTER — Encounter: Payer: Self-pay | Admitting: Internal Medicine

## 2020-02-22 ENCOUNTER — Ambulatory Visit: Payer: BC Managed Care – PPO | Admitting: Internal Medicine

## 2020-03-12 ENCOUNTER — Telehealth: Payer: Self-pay | Admitting: Cardiology

## 2020-03-12 DIAGNOSIS — Z0279 Encounter for issue of other medical certificate: Secondary | ICD-10-CM

## 2020-03-12 NOTE — Telephone Encounter (Signed)
Forms from [facility] received on 03/12/20. Completed patient auth attached. Took form to MD Box for completion. [facility] 03/12/20  PAPERWORK FAXED TO BILLING 03/12/20 106PM GIVEN TO NURSE TO GIVE TO DR [facility] 03/12/20

## 2020-03-26 ENCOUNTER — Other Ambulatory Visit: Payer: Self-pay | Admitting: Podiatry

## 2020-03-26 DIAGNOSIS — S86112A Strain of other muscle(s) and tendon(s) of posterior muscle group at lower leg level, left leg, initial encounter: Secondary | ICD-10-CM

## 2020-03-29 ENCOUNTER — Ambulatory Visit (INDEPENDENT_AMBULATORY_CARE_PROVIDER_SITE_OTHER): Payer: BC Managed Care – PPO | Admitting: Orthopaedic Surgery

## 2020-03-29 ENCOUNTER — Other Ambulatory Visit: Payer: Self-pay

## 2020-03-29 ENCOUNTER — Ambulatory Visit: Payer: Self-pay

## 2020-03-29 VITALS — Ht 67.0 in | Wt 256.0 lb

## 2020-03-29 DIAGNOSIS — M25561 Pain in right knee: Secondary | ICD-10-CM | POA: Diagnosis not present

## 2020-03-29 DIAGNOSIS — M25562 Pain in left knee: Secondary | ICD-10-CM

## 2020-03-29 MED ORDER — METHYLPREDNISOLONE ACETATE 40 MG/ML IJ SUSP
40.0000 mg | INTRAMUSCULAR | Status: AC | PRN
  Administered 2020-03-29: 40 mg via INTRA_ARTICULAR

## 2020-03-29 MED ORDER — BUPIVACAINE HCL 0.5 % IJ SOLN
3.0000 mL | INTRAMUSCULAR | Status: AC | PRN
  Administered 2020-03-29: 3 mL via INTRA_ARTICULAR

## 2020-03-29 MED ORDER — LIDOCAINE HCL 1 % IJ SOLN
0.5000 mL | INTRAMUSCULAR | Status: AC | PRN
  Administered 2020-03-29: .5 mL
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
## 2020-04-02 NOTE — Telephone Encounter (Signed)
Left message for patient to come in and pay fee for form.    Faxed paperwork to [facility]

## 2020-04-03 NOTE — Telephone Encounter (Signed)
Form completed paid for 04/03/20 and picked up by patient [facility] 04/03/20 406P

## 2020-04-06 ENCOUNTER — Ambulatory Visit (HOSPITAL_COMMUNITY)
Admission: RE | Admit: 2020-04-06 | Discharge: 2020-04-06 | Disposition: A | Discharge status: Home / Self Care | Payer: BC Managed Care – PPO | Source: Ambulatory Visit | Attending: Podiatry | Admitting: Podiatry

## 2020-04-06 ENCOUNTER — Ambulatory Visit (INDEPENDENT_AMBULATORY_CARE_PROVIDER_SITE_OTHER): Payer: BC Managed Care – PPO | Admitting: Cardiology

## 2020-04-06 ENCOUNTER — Other Ambulatory Visit: Payer: Self-pay

## 2020-04-06 ENCOUNTER — Encounter: Payer: Self-pay | Admitting: Cardiology

## 2020-04-06 VITALS — BP 150/90 | HR 74 | Ht 67.0 in | Wt 260.0 lb

## 2020-04-06 DIAGNOSIS — M722 Plantar fascial fibromatosis: Secondary | ICD-10-CM | POA: Diagnosis not present

## 2020-04-06 DIAGNOSIS — I2510 Atherosclerotic heart disease of native coronary artery without angina pectoris: Secondary | ICD-10-CM

## 2020-04-06 DIAGNOSIS — M7732 Calcaneal spur, left foot: Secondary | ICD-10-CM | POA: Diagnosis not present

## 2020-04-06 DIAGNOSIS — I480 Paroxysmal atrial fibrillation: Secondary | ICD-10-CM | POA: Diagnosis not present

## 2020-04-06 DIAGNOSIS — S86112A Strain of other muscle(s) and tendon(s) of posterior muscle group at lower leg level, left leg, initial encounter: Secondary | ICD-10-CM | POA: Insufficient documentation

## 2020-04-06 DIAGNOSIS — R600 Localized edema: Secondary | ICD-10-CM | POA: Diagnosis not present

## 2020-04-06 DIAGNOSIS — M65872 Other synovitis and tenosynovitis, left ankle and foot: Secondary | ICD-10-CM | POA: Diagnosis not present

## 2020-04-06 MED ORDER — OLMESARTAN MEDOXOMIL 40 MG PO TABS: 40.0000 mg | ORAL_TABLET | Freq: Every day | ORAL | 3 refills | Status: DC

## 2020-04-06 NOTE — Patient Instructions (Addendum)
Medication Instructions:    INCREASE Benicar to 40 mg daily   *If you need a refill on your cardiac medications before your next appointment, please call your pharmacy*   Lab Work: None today If you have labs (blood work) drawn today and your tests are completely normal, you will receive your results only by: Marland Kitchen MyChart Message (if you have MyChart) OR . A paper copy in the mail If you have any lab test that is abnormal or we need to change your treatment, we will call you to review the results.   Testing/Procedures: None today   Follow-Up: At [facility], you and your health needs are our priority.  As part of our continuing mission to provide you with exceptional heart care, we have created designated Provider Care Teams.  These Care Teams include your primary Cardiologist (physician) and Advanced Practice Providers (APPs -  Physician Assistants and Nurse Practitioners) who all work together to provide you with the care you need, when you need it.  We recommend signing up for the patient portal called "MyChart".  Sign up information is provided on this After Visit Summary.  MyChart is used to connect with patients for Virtual Visits (Telemedicine).  Patients are able to view lab/test results, encounter notes, upcoming appointments, etc.  Non-urgent messages can be sent to your provider as well.   To learn more about what you can do with MyChart, go to [email or web address].    Your next appointment:   3 month(s)  The format for your next appointment:   In Person  Provider:   Carlyle Dolly, MD   Other Instructions None
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
## 2020-04-11 ENCOUNTER — Other Ambulatory Visit: Payer: Self-pay

## 2020-04-11 ENCOUNTER — Ambulatory Visit (INDEPENDENT_AMBULATORY_CARE_PROVIDER_SITE_OTHER): Payer: BC Managed Care – PPO | Admitting: Internal Medicine

## 2020-04-11 ENCOUNTER — Encounter: Payer: Self-pay | Admitting: Internal Medicine

## 2020-04-11 VITALS — BP 126/72 | HR 59 | Temp 97.1°F | Ht 67.0 in | Wt 261.0 lb

## 2020-04-11 DIAGNOSIS — K219 Gastro-esophageal reflux disease without esophagitis: Secondary | ICD-10-CM | POA: Diagnosis not present

## 2020-04-11 DIAGNOSIS — D126 Benign neoplasm of colon, unspecified: Secondary | ICD-10-CM

## 2020-04-11 DIAGNOSIS — R1319 Other dysphagia: Secondary | ICD-10-CM
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
## 2020-04-11 NOTE — Patient Instructions (Signed)
We will schedule you for EGD with possible dilation to further evaluate your difficulty swallowing.  Further recommendations to follow.  At [facility] we value your feedback. You may receive a survey about your visit today. Please share your experience as we strive to create trusting relationships with our patients to provide genuine, compassionate, quality care.  We appreciate your understanding and patience as we review any laboratory studies, imaging, and other diagnostic tests that are ordered as we care for you. Our office policy is 5 business days for review of these results, and any emergent or urgent results are addressed in a timely manner for your best interest. If you do not hear from our office in 1 week, please contact us.   We also encourage the use of MyChart, which contains your medical information for your review as well. If you are not enrolled in this feature, an access code is on this after visit summary for your convenience. Thank you for allowing [place] to be involved in your care.  It was great to see you today!  I hope you have a great rest of your spring!!    Becky [name]. Abbey Chatters, D.O. Gastroenterology and Hepatology [facility]

## 2020-04-13 DIAGNOSIS — M79672 Pain in left foot: Secondary | ICD-10-CM | POA: Diagnosis not present

## 2020-04-13 DIAGNOSIS — M958 Other specified acquired deformities of musculoskeletal system: Secondary | ICD-10-CM | POA: Diagnosis not present

## 2020-04-13 DIAGNOSIS — M722 Plantar fascial fibromatosis: Secondary | ICD-10-CM | POA: Diagnosis not present

## 2020-04-13 DIAGNOSIS — M76822 Posterior tibial tendinitis, left leg: Secondary | ICD-10-CM | POA: Diagnosis not present

## 2020-04-17 ENCOUNTER — Other Ambulatory Visit: Payer: Self-pay | Admitting: Gastroenterology

## 2020-04-18 ENCOUNTER — Telehealth: Payer: Self-pay

## 2020-04-18 NOTE — Telephone Encounter (Signed)
Received refill request for pantoprazole. Dexilant on her medication list. Please find out if she requested this refill for pantoprazole. She doe snot need to be on both.

## 2020-04-18 NOTE — Telephone Encounter (Signed)
Dr. Harl Bowie, pt was seen in our office last week by Dr. Abbey Chatters. Please advise if it's ok for pt to hold Xarelto 48 hours prior to upper endoscopy needed?

## 2020-04-19 NOTE — Telephone Encounter (Signed)
Phoned and LM on pts phone regarding medication

## 2020-04-20 NOTE — Telephone Encounter (Signed)
Ok to hold xarelto  Zandra Abts MD

## 2020-04-23 NOTE — Telephone Encounter (Signed)
Called pt. She has been scheduled for EGD/DIL with Dr. Abbey Chatters on 5/17. Aware will mail prep instructions with pre-op/covid test appt. Confirmed mailing address. Orders entered. Aware okay to hold xarelto 48 hrs prior.

## 2020-04-23 NOTE — Telephone Encounter (Signed)
Routing to [facility] Clinical Pool.

## 2020-04-24 ENCOUNTER — Encounter: Payer: Self-pay | Admitting: *Deleted

## 2020-04-26 NOTE — Telephone Encounter (Signed)
Phoned and spoke with the pt and was advised that she no longer takes pantoprazole, all she takes is the [place] for her stomach.

## 2020-05-03 DIAGNOSIS — G4733 Obstructive sleep apnea (adult) (pediatric): Secondary | ICD-10-CM | POA: Diagnosis not present

## 2020-05-03 NOTE — Patient Instructions (Addendum)
Please continue using your CPAP regularly. While your insurance requires that you use CPAP at least 4 hours each night on 70% of the nights, I recommend, that you not skip any nights and use it throughout the night if you can. Getting used to CPAP and staying with the treatment long term does take time and patience and discipline. Untreated obstructive sleep apnea when it is moderate to severe can have an adverse impact on cardiovascular health and raise her risk for heart disease, arrhythmias, hypertension, congestive heart failure, stroke and diabetes. Untreated obstructive sleep apnea causes sleep disruption, nonrestorative sleep, and sleep deprivation. This can have an impact on your day to day functioning and cause daytime sleepiness and impairment of cognitive function, memory loss, mood disturbance, and problems focussing. Using CPAP regularly can improve these symptoms.   Please work on consistency. I recommend talking with PCP about adding an antihistamine for seasonal allergy symptoms. Continue Flonase and Singulair.   Follow up with me in 3 months    Sleep Apnea Sleep apnea affects breathing during sleep. It causes breathing to stop for a short time or to become shallow. It can also increase the risk of:  Heart attack.  Stroke.  Being very overweight (obese).  Diabetes.  Heart failure.  Irregular heartbeat. The goal of treatment is to help you breathe normally again. What are the causes? There are three kinds of sleep apnea:  Obstructive sleep apnea. This is caused by a blocked or collapsed airway.  Central sleep apnea. This happens when the brain does not send the right signals to the muscles that control breathing.  Mixed sleep apnea. This is a combination of obstructive and central sleep apnea. The most common cause of this condition is a collapsed or blocked airway. This can happen if:  Your throat muscles are too relaxed.  Your tongue and tonsils are too  large.  You are overweight.  Your airway is too small.   What increases the risk?  Being overweight.  Smoking.  Having a small airway.  Being older.  Being female.  Drinking alcohol.  Taking medicines to calm yourself (sedatives or tranquilizers).  Having family members with the condition. What are the signs or symptoms?  Trouble staying asleep.  Being sleepy or tired during the day.  Getting angry a lot.  Loud snoring.  Headaches in the morning.  Not being able to focus your mind (concentrate).  Forgetting things.  Less interest in sex.  Mood swings.  Personality changes.  Feelings of sadness (depression).  Waking up a lot during the night to pee (urinate).  Dry mouth.  Sore throat. How is this diagnosed?  Your medical history.  A physical exam.  A test that is done when you are sleeping (sleep study). The test is most often done in a sleep lab but may also be done at home. How is this treated?  Sleeping on your side.  Using a medicine to get rid of mucus in your nose (decongestant).  Avoiding the use of alcohol, medicines to help you relax, or certain pain medicines (narcotics).  Losing weight, if needed.  Changing your diet.  Not smoking.  Using a machine to open your airway while you sleep, such as: ? An oral appliance. This is a mouthpiece that shifts your lower jaw forward. ? A CPAP device. This device blows air through a mask when you breathe out (exhale). ? An EPAP device. This has valves that you put in each nostril. ? A BPAP  device. This device blows air through a mask when you breathe in (inhale) and breathe out.  Having surgery if other treatments do not work. It is important to get treatment for sleep apnea. Without treatment, it can lead to:  High blood pressure.  Coronary artery disease.  In men, not being able to have an erection (impotence).  Reduced thinking ability.   Follow these instructions at  home: Lifestyle  Make changes that your doctor recommends.  Eat a healthy diet.  Lose weight if needed.  Avoid alcohol, medicines to help you relax, and some pain medicines.  Do not use any products that contain nicotine or tobacco, such as cigarettes, e-cigarettes, and chewing tobacco. If you need help quitting, ask your doctor. General instructions  Take over-the-counter and prescription medicines only as told by your doctor.  If you were given a machine to use while you sleep, use it only as told by your doctor.  If you are having surgery, make sure to tell your doctor you have sleep apnea. You may need to bring your device with you.  Keep all follow-up visits as told by your doctor. This is important. Contact a doctor if:  The machine that you were given to use during sleep bothers you or does not seem to be working.  You do not get better.  You get worse. Get help right away if:  Your chest hurts.  You have trouble breathing in enough air.  You have an uncomfortable feeling in your back, arms, or stomach.  You have trouble talking.  One side of your body feels weak.  A part of your face is hanging down. These symptoms may be an emergency. Do not wait to see if the symptoms will go away. Get medical help right away. Call your local emergency services (911 in the [place] Do not drive yourself to the hospital. Summary  This condition affects breathing during sleep.  The most common cause is a collapsed or blocked airway.  The goal of treatment is to help you breathe normally while you sleep. This information is not intended to replace advice given to you by your health care provider. Make sure you discuss any questions you have with your health care provider. Document Revised: 10/09/2017 Document Reviewed: 08/18/2017 Elsevier Patient Education  [place].
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
## 2020-05-07 ENCOUNTER — Ambulatory Visit (INDEPENDENT_AMBULATORY_CARE_PROVIDER_SITE_OTHER): Payer: BC Managed Care – PPO | Admitting: Family Medicine

## 2020-05-07 ENCOUNTER — Encounter: Payer: Self-pay | Admitting: Family Medicine

## 2020-05-07 VITALS — BP 135/82 | HR 59 | Ht 67.0 in | Wt 262.0 lb

## 2020-05-07 DIAGNOSIS — G4733 Obstructive sleep apnea (adult) (pediatric): Secondary | ICD-10-CM

## 2020-05-07 DIAGNOSIS — Z9989 Dependence on other enabling machines and devices: Secondary | ICD-10-CM | POA: Diagnosis not present

## 2020-05-08 DIAGNOSIS — J302 Other seasonal allergic rhinitis: Secondary | ICD-10-CM | POA: Diagnosis not present

## 2020-05-08 DIAGNOSIS — E119 Type 2 diabetes mellitus without complications: Secondary | ICD-10-CM | POA: Diagnosis not present

## 2020-05-08 DIAGNOSIS — E039 Hypothyroidism, unspecified: Secondary | ICD-10-CM | POA: Diagnosis not present

## 2020-05-08 DIAGNOSIS — I10 Essential (primary) hypertension: Secondary | ICD-10-CM | POA: Diagnosis not present

## 2020-05-08 DIAGNOSIS — E1140 Type 2 diabetes mellitus with diabetic neuropathy, unspecified: Secondary | ICD-10-CM | POA: Diagnosis not present

## 2020-05-08 DIAGNOSIS — Z6838 Body mass index (BMI) 38.0-38.9, adult: Secondary | ICD-10-CM | POA: Diagnosis not present

## 2020-05-17 ENCOUNTER — Telehealth: Payer: Self-pay | Admitting: *Deleted

## 2020-05-17 NOTE — Telephone Encounter (Signed)
Fixed paperwork. It is at the front desk.

## 2020-05-17 NOTE — Telephone Encounter (Signed)
LMOVM for pt 

## 2020-05-17 NOTE — Telephone Encounter (Signed)
Pt has picked up her papers.

## 2020-05-17 NOTE — Telephone Encounter (Signed)
Patient called in. She states her job is saying she needs the paperwork to state for her to be off 5/16-5/18 to return on 5/19. She needs 3 days for it to be considered FMLA or she will get 20 points at work. She is sending daughter back to the office with the paperwork. Also states she needs this done so she can turn in tomorrow to her job

## 2020-05-17 NOTE — Patient Instructions (Addendum)
Saralyn Willison  05/17/2020     @PREFPERIOPPHARMACY @   Your procedure is scheduled on 05/22/2020.  Report to [facility] at 12:15 PM  Call this number if you have problems the morning of surgery:  [phone]   Remember:  Do not eat or drink after midnight.     Take these medicines the morning of surgery with A SIP OF WATER : Thyroid medication, Dexilant, Cardizem and Metoprolol     Please use your Flonase spray the morning of the surgery.   Do not wear jewelry, make-up or nail polish.  Do not wear lotions, powders, or perfumes, or deodorant.  Do not shave 48 hours prior to surgery.  Men may shave face and neck.  Do not bring valuables to the hospital.  [facility] is not responsible for any belongings or valuables.  Contacts, dentures or bridgework may not be worn into surgery.  Leave your suitcase in the car.  After surgery it may be brought to your room.  For patients admitted to the hospital, discharge time will be determined by your treatment team.  Patients discharged the day of surgery will not be allowed to drive home.   Name and phone number of your driver:   family Special instructions:  n/a  Please read over the following fact sheets that you were given. Care and Recovery After Surgery    Upper Endoscopy, Adult Upper endoscopy is a procedure to look inside the upper GI (gastrointestinal) tract. The upper GI tract is made up of:  The part of the body that moves food from your mouth to your stomach (esophagus).  The stomach.  The first part of your small intestine (duodenum). This procedure is also called esophagogastroduodenoscopy (EGD) or gastroscopy. In this procedure, your health care provider passes a thin, flexible tube (endoscope) through your mouth and down your esophagus into your stomach. A small camera is attached to the end of the tube. Images from the camera appear on a monitor in the exam room. During this procedure, your health care provider  may also remove a small piece of tissue to be sent to a lab and examined under a microscope (biopsy). Your health care provider may do an upper endoscopy to diagnose cancers of the upper GI tract. You may also have this procedure to find the cause of other conditions, such as:  Stomach pain.  Heartburn.  Pain or problems when swallowing.  Nausea and vomiting.  Stomach bleeding.  Stomach ulcers. Tell a health care provider about:  Any allergies you have.  All medicines you are taking, including vitamins, herbs, eye drops, creams, and over-the-counter medicines.  Any problems you or family members have had with anesthetic medicines.  Any blood disorders you have.  Any surgeries you have had.  Any medical conditions you have.  Whether you are pregnant or may be pregnant. What are the risks? Generally, this is a safe procedure. However, problems may occur, including:  Infection.  Bleeding.  Allergic reactions to medicines.  A tear or hole (perforation) in the esophagus, stomach, or duodenum. What happens before the procedure? Staying hydrated Follow instructions from your health care provider about hydration, which may include:  Up to 2 hours before the procedure - you may continue to drink clear liquids, such as water, clear fruit juice, black coffee, and plain tea.   Eating and drinking restrictions Follow instructions from your health care provider about eating and drinking, which may include:  8 hours before the procedure -  stop eating heavy meals or foods, such as meat, fried foods, or fatty foods.  6 hours before the procedure - stop eating light meals or foods, such as toast or cereal.  6 hours before the procedure - stop drinking milk or drinks that contain milk.  2 hours before the procedure - stop drinking clear liquids. Medicines Ask your health care provider about:  Changing or stopping your regular medicines. This is especially important if you are  taking diabetes medicines or blood thinners.  Taking medicines such as aspirin and ibuprofen. These medicines can thin your blood. Do not take these medicines unless your health care provider tells you to take them.  Taking over-the-counter medicines, vitamins, herbs, and supplements. General instructions  Plan to have someone take you home from the hospital or clinic.  If you will be going home right after the procedure, plan to have someone with you for 24 hours.  Ask your health care provider what steps will be taken to help prevent infection. What happens during the procedure?  An IV will be inserted into one of your veins.  You may be given one or more of the following: ? A medicine to help you relax (sedative). ? A medicine to numb the throat (local anesthetic).  You will lie on your left side on an exam table.  Your health care provider will pass the endoscope through your mouth and down your esophagus.  Your health care provider will use the scope to check the inside of your esophagus, stomach, and duodenum. Biopsies may be taken.  The endoscope will be removed. The procedure may vary among health care providers and hospitals.   What happens after the procedure?  Your blood pressure, heart rate, breathing rate, and blood oxygen level will be monitored until you leave the hospital or clinic.  Do not drive for 24 hours if you were given a sedative during your procedure.  When your throat is no longer numb, you may be given some fluids to drink.  It is up to you to get the results of your procedure. Ask your health care provider, or the department that is doing the procedure, when your results will be ready. Summary  Upper endoscopy is a procedure to look inside the upper GI tract.  During the procedure, an IV will be inserted into one of your veins. You may be given a medicine to help you relax.  A medicine will be used to numb your throat.  The endoscope will be  passed through your mouth and down your esophagus. This information is not intended to replace advice given to you by your health care provider. Make sure you discuss any questions you have with your health care provider. Document Revised: 06/17/2017 Document Reviewed: 05/25/2017 Elsevier Patient Education  2021 [employer].

## 2020-05-21 ENCOUNTER — Other Ambulatory Visit: Payer: Self-pay

## 2020-05-21 ENCOUNTER — Other Ambulatory Visit: Payer: Self-pay | Admitting: Adult Health

## 2020-05-21 ENCOUNTER — Encounter (HOSPITAL_COMMUNITY)
Admission: RE | Admit: 2020-05-21 | Discharge: 2020-05-21 | Disposition: A | Discharge status: Home / Self Care | Payer: BC Managed Care – PPO | Source: Ambulatory Visit | Attending: Internal Medicine | Admitting: Internal Medicine

## 2020-05-21 ENCOUNTER — Other Ambulatory Visit: Payer: Self-pay | Admitting: *Deleted

## 2020-05-21 ENCOUNTER — Other Ambulatory Visit (HOSPITAL_COMMUNITY)
Admission: RE | Admit: 2020-05-21 | Discharge: 2020-05-21 | Disposition: A | Discharge status: Home / Self Care | Payer: BC Managed Care – PPO | Source: Ambulatory Visit | Attending: Internal Medicine | Admitting: Internal Medicine

## 2020-05-21 DIAGNOSIS — Z87891 Personal history of nicotine dependence: Secondary | ICD-10-CM | POA: Diagnosis not present

## 2020-05-21 DIAGNOSIS — Z01812 Encounter for preprocedural laboratory examination: Secondary | ICD-10-CM | POA: Insufficient documentation

## 2020-05-21 DIAGNOSIS — K2970 Gastritis, unspecified, without bleeding: Secondary | ICD-10-CM | POA: Diagnosis not present

## 2020-05-21 DIAGNOSIS — Z20822 Contact with and (suspected) exposure to covid-19: Secondary | ICD-10-CM | POA: Insufficient documentation

## 2020-05-21 DIAGNOSIS — Z888 Allergy status to other drugs, medicaments and biological substances status: Secondary | ICD-10-CM | POA: Diagnosis not present

## 2020-05-21 DIAGNOSIS — Z79890 Hormone replacement therapy (postmenopausal): Secondary | ICD-10-CM | POA: Diagnosis not present

## 2020-05-21 DIAGNOSIS — K3189 Other diseases of stomach and duodenum: Secondary | ICD-10-CM | POA: Diagnosis not present

## 2020-05-21 DIAGNOSIS — Z7901 Long term (current) use of anticoagulants: Secondary | ICD-10-CM | POA: Diagnosis not present

## 2020-05-21 DIAGNOSIS — Z79899 Other long term (current) drug therapy: Secondary | ICD-10-CM | POA: Diagnosis not present

## 2020-05-21 DIAGNOSIS — R1310 Dysphagia, unspecified: Secondary | ICD-10-CM | POA: Diagnosis not present

## 2020-05-21 DIAGNOSIS — Z6841 Body Mass Index (BMI) 40.0 and over, adult: Secondary | ICD-10-CM | POA: Diagnosis not present

## 2020-05-21 LAB — BASIC METABOLIC PANEL
Anion gap: 6 (ref 5–15)
BUN: 13 mg/dL (ref 6–20)
CO2: 26 mmol/L (ref 22–32)
Calcium: 9.4 mg/dL (ref 8.9–10.3)
Chloride: 105 mmol/L (ref 98–111)
Creatinine, Ser: 0.99 mg/dL (ref 0.44–1.00)
GFR, Estimated: >60 mL/min (ref >60)
Glucose, Bld: 123 mg/dL — ABNORMAL HIGH (ref 70–99)
Potassium: 4.1 mmol/L (ref 3.5–5.1)
Sodium: 137 mmol/L (ref 135–145)

## 2020-05-21 LAB — CBC WITH DIFFERENTIAL/PLATELET
Abs Immature Granulocytes: 0.04 10*3/uL (ref 0.00–0.07)
Basophils Absolute: 0 10*3/uL (ref 0.0–0.1)
Basophils Relative: 0 %
Eosinophils Absolute: 0.2 10*3/uL (ref 0.0–0.5)
Eosinophils Relative: 3 %
HCT: 36.4 % (ref 36.0–46.0)
Hemoglobin: 11.8 g/dL — ABNORMAL LOW (ref 12.0–15.0)
Immature Granulocytes: 1 %
Lymphocytes Relative: 36 %
Lymphs Abs: 2.7 10*3/uL (ref 0.7–4.0)
MCH: 31.1 pg (ref 26.0–34.0)
MCHC: 32.4 g/dL (ref 30.0–36.0)
MCV: 96 fL (ref 80.0–100.0)
Monocytes Absolute: 0.7 10*3/uL (ref 0.1–1.0)
Monocytes Relative: 10 %
Neutro Abs: 3.8 10*3/uL (ref 1.7–7.7)
Neutrophils Relative %: 50 %
Platelets: 278 10*3/uL (ref 150–400)
RBC: 3.79 MIL/uL — ABNORMAL LOW (ref 3.87–5.11)
RDW: 12.8 % (ref 11.5–15.5)
WBC: 7.5 10*3/uL (ref 4.0–10.5)
nRBC: 0 % (ref 0.0–0.2)

## 2020-05-21 LAB — TSH: TSH: 3.299 u[IU]/mL (ref 0.350–4.500)

## 2020-05-21 LAB — SARS CORONAVIRUS 2 (TAT 6-24 HRS): SARS Coronavirus 2: NEGATIVE

## 2020-05-21 NOTE — Telephone Encounter (Signed)
Patient wants to know if Anderson Malta could send over her Thyroid medication to her Pharmacy, per patient. Clinical staff will follow up with patient.

## 2020-05-22 ENCOUNTER — Ambulatory Visit (HOSPITAL_COMMUNITY)
Admission: RE | Admit: 2020-05-22 | Discharge: 2020-05-22 | Disposition: A | Discharge status: Home / Self Care | Payer: BC Managed Care – PPO | Attending: Internal Medicine | Admitting: Internal Medicine

## 2020-05-22 ENCOUNTER — Ambulatory Visit (HOSPITAL_COMMUNITY): Payer: BC Managed Care – PPO | Admitting: Certified Registered Nurse Anesthetist

## 2020-05-22 ENCOUNTER — Encounter (HOSPITAL_COMMUNITY)
Admission: RE | Disposition: A | Discharge status: Home / Self Care | Payer: Self-pay | Source: Home / Self Care | Attending: Internal Medicine

## 2020-05-22 ENCOUNTER — Telehealth: Payer: Self-pay | Admitting: Adult Health

## 2020-05-22 ENCOUNTER — Encounter (HOSPITAL_COMMUNITY): Payer: Self-pay

## 2020-05-22 ENCOUNTER — Other Ambulatory Visit: Payer: Self-pay

## 2020-05-22 DIAGNOSIS — Z6841 Body Mass Index (BMI) 40.0 and over, adult: Secondary | ICD-10-CM | POA: Diagnosis not present

## 2020-05-22 DIAGNOSIS — Z7901 Long term (current) use of anticoagulants: Secondary | ICD-10-CM | POA: Diagnosis not present

## 2020-05-22 DIAGNOSIS — Z79890 Hormone replacement therapy (postmenopausal): Secondary | ICD-10-CM | POA: Diagnosis not present

## 2020-05-22 DIAGNOSIS — I480 Paroxysmal atrial fibrillation: Secondary | ICD-10-CM | POA: Diagnosis not present

## 2020-05-22 DIAGNOSIS — K2970 Gastritis, unspecified, without bleeding: Secondary | ICD-10-CM

## 2020-05-22 DIAGNOSIS — R12 Heartburn: Secondary | ICD-10-CM | POA: Diagnosis not present

## 2020-05-22 DIAGNOSIS — K3189 Other diseases of stomach and duodenum: Secondary | ICD-10-CM | POA: Diagnosis not present

## 2020-05-22 DIAGNOSIS — Z79899 Other long term (current) drug therapy: Secondary | ICD-10-CM | POA: Diagnosis not present

## 2020-05-22 DIAGNOSIS — R1310 Dysphagia, unspecified: Secondary | ICD-10-CM | POA: Diagnosis not present

## 2020-05-22 DIAGNOSIS — Z20822 Contact with and (suspected) exposure to covid-19: Secondary | ICD-10-CM | POA: Insufficient documentation

## 2020-05-22 DIAGNOSIS — Z87891 Personal history of nicotine dependence: Secondary | ICD-10-CM | POA: Insufficient documentation

## 2020-05-22 DIAGNOSIS — Z888 Allergy status to other drugs, medicaments and biological substances status: Secondary | ICD-10-CM | POA: Diagnosis not present

## 2020-05-22 HISTORY — PX: ESOPHAGOGASTRODUODENOSCOPY (EGD) WITH PROPOFOL: SHX5813

## 2020-05-22 HISTORY — PX: BIOPSY: SHX5522

## 2020-05-22 HISTORY — PX: BALLOON DILATION: SHX5330

## 2020-05-22 LAB — T3: T3, Total: 132 ng/dL (ref 71–180)

## 2020-05-22 SURGERY — ESOPHAGOGASTRODUODENOSCOPY (EGD) WITH PROPOFOL
Anesthesia: General

## 2020-05-22 MED ORDER — STERILE WATER FOR IRRIGATION IR SOLN
Status: DC | PRN
  Administered 2020-05-22: 1.5 mL

## 2020-05-22 MED ORDER — LIDOCAINE HCL (CARDIAC) PF 100 MG/5ML IV SOSY
PREFILLED_SYRINGE | INTRAVENOUS | Status: DC | PRN
  Administered 2020-05-22: 100 mg via INTRAVENOUS

## 2020-05-22 MED ORDER — LACTATED RINGERS IV SOLN
INTRAVENOUS | Status: DC
  Administered 2020-05-22: 1000 mL via INTRAVENOUS

## 2020-05-22 MED ORDER — PROPOFOL 10 MG/ML IV BOLUS
INTRAVENOUS | Status: DC | PRN
  Administered 2020-05-22: 50 mg via INTRAVENOUS
  Administered 2020-05-22: 150 ug/kg/min via INTRAVENOUS
  Administered 2020-05-22: 100 mg via INTRAVENOUS

## 2020-05-22 NOTE — Anesthesia Procedure Notes (Signed)
Performed by: Jarome Trull L, CRNA Pre-anesthesia Checklist: Patient identified, Emergency Drugs available, Suction available, Patient being monitored and Timeout performed Patient Re-evaluated:Patient Re-evaluated prior to induction Oxygen Delivery Method: Nasal cannula Induction Type: IV induction       

## 2020-05-22 NOTE — Anesthesia Preprocedure Evaluation (Signed)
Anesthesia Evaluation  Patient identified by MRN, date of birth, ID band Patient awake    Reviewed: Allergy & Precautions, H&P , NPO status , Patient's Chart, lab work & pertinent test results, reviewed documented beta blocker date and time   Airway Mallampati: II  TM Distance: >3 FB Neck ROM: full    Dental no notable dental hx.    Pulmonary shortness of breath, asthma , sleep apnea , former smoker,    Pulmonary exam normal breath sounds clear to auscultation       Cardiovascular Exercise Tolerance: Good hypertension, + CAD  + dysrhythmias Atrial Fibrillation  Rhythm:irregular Rate:Normal     Neuro/Psych  Headaches, negative psych ROS   GI/Hepatic Neg liver ROS, PUD, GERD  Medicated,  Endo/Other  diabetes, Well Controlled, Type 2Hypothyroidism Morbid obesity  Renal/GU negative Renal ROS  negative genitourinary   Musculoskeletal   Abdominal   Peds  Hematology  (+) Blood dyscrasia, anemia ,   Anesthesia Other Findings   Reproductive/Obstetrics negative OB ROS                             Anesthesia Physical Anesthesia Plan  ASA: II  Anesthesia Plan: General   Post-op Pain Management:    Induction:   PONV Risk Score and Plan:   Airway Management Planned:   Additional Equipment:   Intra-op Plan:   Post-operative Plan:   Informed Consent: I have reviewed the patients History and Physical, chart, labs and discussed the procedure including the risks, benefits and alternatives for the proposed anesthesia with the patient or authorized representative who has indicated his/her understanding and acceptance.     Dental Advisory Given  Plan Discussed with: CRNA  Anesthesia Plan Comments:         Anesthesia Quick Evaluation

## 2020-05-22 NOTE — Anesthesia Postprocedure Evaluation (Signed)
Anesthesia Post Note  [name]  Procedure(s) Performed: ESOPHAGOGASTRODUODENOSCOPY (EGD) WITH PROPOFOL (N/A ) BALLOON DILATION (N/A ) BIOPSY  Patient location during evaluation: Phase II Anesthesia Type: General Level of consciousness: awake Pain management: pain level controlled Vital Signs Assessment: post-procedure vital signs reviewed and stable Respiratory status: spontaneous breathing and respiratory function stable Cardiovascular status: blood pressure returned to baseline and stable Postop Assessment: no headache and no apparent nausea or vomiting Anesthetic complications: no Comments: Late entry   No complications documented.   Last Vitals:  Vitals:   05/22/20 1225 05/22/20 1249  BP: (!) 126/94   Pulse: (!) 57 (!) 49  Resp: 17 16  Temp: 37 C 36.6 C  SpO2: 99%     Last Pain:  Vitals:   05/22/20 1249  TempSrc: Oral  PainSc:                  [name]

## 2020-05-22 NOTE — Discharge Instructions (Signed)
EGD Discharge instructions Please read the instructions outlined below and refer to this sheet in the next few weeks. These discharge instructions provide you with general information on caring for yourself after you leave the hospital. Your doctor may also give you specific instructions. While your treatment has been planned according to the most current medical practices available, unavoidable complications occasionally occur. If you have any problems or questions after discharge, please call your doctor. ACTIVITY  You may resume your regular activity but move at a slower pace for the next 24 hours.   Take frequent rest periods for the next 24 hours.   Walking will help expel (get rid of) the air and reduce the bloated feeling in your abdomen.   No driving for 24 hours (because of the anesthesia (medicine) used during the test).   You may shower.   Do not sign any important legal documents or operate any machinery for 24 hours (because of the anesthesia used during the test).  NUTRITION  Drink plenty of fluids.   You may resume your normal diet.   Begin with a light meal and progress to your normal diet.   Avoid alcoholic beverages for 24 hours or as instructed by your caregiver.  MEDICATIONS  You may resume your normal medications unless your caregiver tells you otherwise.  WHAT YOU CAN EXPECT TODAY  You may experience abdominal discomfort such as a feeling of fullness or "gas" pains.  FOLLOW-UP  Your doctor will discuss the results of your test with you.  SEEK IMMEDIATE MEDICAL ATTENTION IF ANY OF THE FOLLOWING OCCUR:  Excessive nausea (feeling sick to your stomach) and/or vomiting.   Severe abdominal pain and distention (swelling).   Trouble swallowing.   Temperature over 101 F (37.8 C).   Rectal bleeding or vomiting of blood.   Your EGD revealed a mild amount inflammation your stomach.  I took biopsies of this to rule infection bacteria called H. pylori.  We will  contact you next week with these results.  You did have a slight narrowing of your esophagus which I dilated with a balloon.  Hopefully this helps with your swallowing.  Continue on Dexilant.  Follow-up with GI in 3 to 4 months.  I hope you have a great rest of your week!  Elon Alas. Abbey Chatters, D.O. Gastroenterology and Hepatology [facility]

## 2020-05-22 NOTE — Op Note (Signed)
[facility] Patient Name: Becky [name] Procedure Date: 05/22/2020 11:53 AM MRN: [id number] Date of Birth: [birth year] Attending MD: Elon Alas. Abbey Chatters DO CSN: [id number] Age: 60 Admit Type: Outpatient Procedure:                Upper GI endoscopy Indications:              Dysphagia, Heartburn Providers:                Elon Alas. Abbey Chatters, DO, Caprice Kluver, Tammy Vaught,                            RN, Nelma Rothman, Technician Referring MD:              Medicines:                See the Anesthesia note for documentation of the                            administered medications Complications:            No immediate complications. Estimated Blood Loss:     Estimated blood loss was minimal. Procedure:                Pre-Anesthesia Assessment:                           - The anesthesia plan was to use monitored                            anesthesia care (MAC).                           After obtaining informed consent, the endoscope was                            passed under direct vision. Throughout the                            procedure, the patient's blood pressure, pulse, and                            oxygen saturations were monitored continuously. The                            [id number]-H190 ([id number]) scope was introduced through the                            mouth, and advanced to the second part of duodenum.                            The upper GI endoscopy was accomplished without                            difficulty. The patient tolerated the procedure                            well. Scope In: 12:40:30  PM Scope Out: 12:45:55 PM Total Procedure Duration: 0 hours 5 minutes 25 seconds  Findings:      There is no endoscopic evidence of bleeding, areas of erosion,       esophagitis, hiatal hernia, ulcerations or varices in the entire       esophagus.      No endoscopic abnormality was evident in the esophagus to explain the       patient's complaint of dysphagia. Preparations  were made for empiric       dilation. A TTS dilator was passed through the scope. Dilation with an       18-19-20 mm balloon dilator was performed to 20 mm. Dilation was       performed with a mild resistance at 20 mm DUE TO POSSIBLE PROXIMAL       ESOPHAGEAL WEB. Estimated blood loss was none.      Localized moderate inflammation characterized by erosions and erythema       was found in the gastric antrum. Biopsies were taken with a cold forceps       for Helicobacter pylori testing.      The duodenal bulb, first portion of the duodenum and second portion of       the duodenum were normal. Impression:               - Gastritis. Biopsied.                           - Normal duodenal bulb, first portion of the                            duodenum and second portion of the duodenum. Moderate Sedation:      Per Anesthesia Care Recommendation:           - Patient has a contact number available for                            emergencies. The signs and symptoms of potential                            delayed complications were discussed with the                            patient. Return to normal activities tomorrow.                            Written discharge instructions were provided to the                            patient.                           - Resume previous diet.                           - Continue present medications.                           - Await pathology results.                           -  Repeat upper endoscopy PRN for retreatment.                           - Return to GI clinic in 4 months. Procedure Code(s):        --- Professional ---                           [phone], Esophagogastroduodenoscopy, flexible,                            transoral; with biopsy, single or multiple Diagnosis Code(s):        --- Professional ---                           K29.70, Gastritis, unspecified, without bleeding                           R13.10, Dysphagia, unspecified                            R12, Heartburn CPT copyright 2019 [employer]. All rights reserved. The codes documented in this report are preliminary and upon coder review may  be revised to meet current compliance requirements. Elon Alas. Abbey Chatters, DO [place] Abbey Chatters, DO 05/22/2020 12:53:41 PM This report has been signed electronically. Number of Addenda: 0
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
## 2020-05-22 NOTE — Telephone Encounter (Signed)
Left message that thyroid labs normal, will not refill NP thyroid at this time, will place in recall to recheck thyroid labs in 3 months.

## 2020-05-22 NOTE — Transfer of Care (Signed)
Immediate Anesthesia Transfer of Care Note  [name]  Procedure(s) Performed: ESOPHAGOGASTRODUODENOSCOPY (EGD) WITH PROPOFOL (N/A ) BALLOON DILATION (N/A ) BIOPSY  Patient Location: Short Stay  Anesthesia Type:General  Level of Consciousness: awake, drowsy and patient cooperative  Airway & Oxygen Therapy: Patient Spontanous Breathing  Post-op Assessment: Report given to RN, Post -op Vital signs reviewed and stable and Patient moving all extremities  Post vital signs: Reviewed and stable  Last Vitals:  Vitals Value Taken Time  BP    Temp    Pulse    Resp    SpO2      Last Pain:  Vitals:   05/22/20 1235  TempSrc:   PainSc: 0-No pain      Patients Stated Pain Goal: 6 (56/38/93 7342)  Complications: No complications documented.

## 2020-05-23 LAB — SURGICAL PATHOLOGY

## 2020-05-25 DIAGNOSIS — Z0189 Encounter for other specified special examinations: Secondary | ICD-10-CM | POA: Diagnosis not present

## 2020-05-28 ENCOUNTER — Encounter (HOSPITAL_COMMUNITY): Payer: Self-pay | Admitting: Internal Medicine

## 2020-05-30 ENCOUNTER — Other Ambulatory Visit: Payer: Self-pay | Admitting: Student

## 2020-05-30 NOTE — Telephone Encounter (Signed)
Prescription refill request for Xarelto received.  Indication: afib  Last office visit: Branch, 4/12022 Weight: 117 kg  Age: 60 yo Scr: 0.99, 05/21/2020 CrCl: 114 ml/min   Pt is on the correct dose of Xarelto per dosing criteria, prescription refill sent for Xarelto 20 mg daily.

## 2020-06-08 DIAGNOSIS — M76822 Posterior tibial tendinitis, left leg: Secondary | ICD-10-CM | POA: Diagnosis not present

## 2020-06-12 ENCOUNTER — Encounter (HOSPITAL_COMMUNITY): Payer: Self-pay | Admitting: Physical Therapy

## 2020-06-12 ENCOUNTER — Other Ambulatory Visit: Payer: Self-pay

## 2020-06-12 ENCOUNTER — Ambulatory Visit (HOSPITAL_COMMUNITY): Payer: BC Managed Care – PPO | Attending: Orthopedic Surgery | Admitting: Physical Therapy

## 2020-06-12 DIAGNOSIS — R2689 Other abnormalities of gait and mobility: Secondary | ICD-10-CM

## 2020-06-12 DIAGNOSIS — R29898 Other symptoms and signs involving the musculoskeletal system: Secondary | ICD-10-CM

## 2020-06-12 DIAGNOSIS — M25572 Pain in left ankle and joints of left foot: Secondary | ICD-10-CM | POA: Diagnosis not present

## 2020-06-12 DIAGNOSIS — M6281 Muscle weakness (generalized): Secondary | ICD-10-CM
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
## 2020-06-14 ENCOUNTER — Ambulatory Visit (HOSPITAL_COMMUNITY): Payer: BC Managed Care – PPO | Admitting: Physical Therapy

## 2020-06-14 ENCOUNTER — Other Ambulatory Visit: Payer: Self-pay

## 2020-06-14 DIAGNOSIS — R2689 Other abnormalities of gait and mobility: Secondary | ICD-10-CM

## 2020-06-14 DIAGNOSIS — R29898 Other symptoms and signs involving the musculoskeletal system: Secondary | ICD-10-CM

## 2020-06-14 DIAGNOSIS — M6281 Muscle weakness (generalized): Secondary | ICD-10-CM

## 2020-06-14 DIAGNOSIS — M25572 Pain in left ankle and joints of left foot: Secondary | ICD-10-CM | POA: Diagnosis not present
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
## 2020-06-19 ENCOUNTER — Ambulatory Visit (HOSPITAL_COMMUNITY): Payer: BC Managed Care – PPO | Admitting: Physical Therapy

## 2020-06-19 ENCOUNTER — Encounter (HOSPITAL_COMMUNITY): Payer: Self-pay | Admitting: Physical Therapy

## 2020-06-19 ENCOUNTER — Other Ambulatory Visit: Payer: Self-pay

## 2020-06-19 DIAGNOSIS — M25572 Pain in left ankle and joints of left foot: Secondary | ICD-10-CM

## 2020-06-19 DIAGNOSIS — M6281 Muscle weakness (generalized): Secondary | ICD-10-CM | POA: Diagnosis not present

## 2020-06-19 DIAGNOSIS — R2689 Other abnormalities of gait and mobility: Secondary | ICD-10-CM

## 2020-06-19 DIAGNOSIS — R29898 Other symptoms and signs involving the musculoskeletal system: Secondary | ICD-10-CM

## 2020-06-19 NOTE — Patient Instructions (Signed)
Access Code: [email or web address] URL: [email or web address] Date: 06/19/2020 Prepared by: Josue Hector  Exercises Sidelying Hip Abduction - 3 x daily - 7 x weekly - 2 sets - 10 reps
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
## 2020-06-20 ENCOUNTER — Ambulatory Visit (INDEPENDENT_AMBULATORY_CARE_PROVIDER_SITE_OTHER): Payer: BC Managed Care – PPO | Admitting: Cardiology

## 2020-06-20 ENCOUNTER — Other Ambulatory Visit (HOSPITAL_COMMUNITY)
Admission: RE | Admit: 2020-06-20 | Discharge: 2020-06-20 | Disposition: A | Discharge status: Home / Self Care | Payer: BC Managed Care – PPO | Source: Ambulatory Visit | Attending: Cardiology | Admitting: Cardiology

## 2020-06-20 ENCOUNTER — Encounter: Payer: Self-pay | Admitting: Cardiology

## 2020-06-20 VITALS — BP 126/70 | HR 60 | Ht 67.0 in | Wt 261.0 lb

## 2020-06-20 DIAGNOSIS — R600 Localized edema: Secondary | ICD-10-CM

## 2020-06-20 DIAGNOSIS — I480 Paroxysmal atrial fibrillation: Secondary | ICD-10-CM | POA: Diagnosis not present

## 2020-06-20 LAB — BASIC METABOLIC PANEL
Anion gap: 6 (ref 5–15)
BUN: 16 mg/dL (ref 6–20)
CO2: 29 mmol/L (ref 22–32)
Calcium: 9.5 mg/dL (ref 8.9–10.3)
Chloride: 105 mmol/L (ref 98–111)
Creatinine, Ser: 1.02 mg/dL — ABNORMAL HIGH (ref 0.44–1.00)
GFR, Estimated: >60 mL/min (ref >60)
Glucose, Bld: 123 mg/dL — ABNORMAL HIGH (ref 70–99)
Potassium: 4 mmol/L (ref 3.5–5.1)
Sodium: 140 mmol/L (ref 135–145)

## 2020-06-20 LAB — MAGNESIUM: Magnesium: 2 mg/dL (ref 1.7–2.4)
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
## 2020-06-20 NOTE — Patient Instructions (Signed)
Medication Instructions:   Your physician recommends that you continue on your current medications as directed. Please refer to the Current Medication list given to you today.  *If you need a refill on your cardiac medications before your next appointment, please call your pharmacy*   Lab Work: [facility] AND [place]   If you have labs (blood work) drawn today and your tests are completely normal, you will receive your results only by: [place] (if you have MyChart) OR A paper copy in the mail If you have any lab test that is abnormal or we need to change your treatment, we will call you to review the results.   Testing/Procedures: NONE ORDERED  TODAY    Follow-Up: At [facility], you and your health needs are our priority.  As part of our continuing mission to provide you with exceptional heart care, we have created designated Provider Care Teams.  These Care Teams include your primary Cardiologist (physician) and Advanced Practice Providers (APPs -  Physician Assistants and Nurse Practitioners) who all work together to provide you with the care you need, when you need it.  We recommend signing up for the patient portal called "MyChart".  Sign up information is provided on this After Visit Summary.  MyChart is used to connect with patients for Virtual Visits (Telemedicine).  Patients are able to view lab/test results, encounter notes, upcoming appointments, etc.  Non-urgent messages can be sent to your provider as well.   To learn more about what you can do with MyChart, go to [email or web address].    Your next appointment:   6 month(s)  The format for your next appointment:   In Person  Provider:   Carlyle Dolly, MD   Other Instructions

## 2020-06-21 ENCOUNTER — Ambulatory Visit (HOSPITAL_COMMUNITY): Payer: BC Managed Care – PPO

## 2020-06-21 ENCOUNTER — Encounter (HOSPITAL_COMMUNITY): Payer: Self-pay

## 2020-06-21 ENCOUNTER — Other Ambulatory Visit: Payer: Self-pay

## 2020-06-21 DIAGNOSIS — M25572 Pain in left ankle and joints of left foot: Secondary | ICD-10-CM

## 2020-06-21 DIAGNOSIS — R29898 Other symptoms and signs involving the musculoskeletal system: Secondary | ICD-10-CM | POA: Diagnosis not present

## 2020-06-21 DIAGNOSIS — R2689 Other abnormalities of gait and mobility: Secondary | ICD-10-CM | POA: Diagnosis not present

## 2020-06-21 DIAGNOSIS — M6281 Muscle weakness (generalized): Secondary | ICD-10-CM | POA: Diagnosis not present
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
## 2020-06-26 ENCOUNTER — Encounter (HOSPITAL_COMMUNITY): Payer: Self-pay | Admitting: Physical Therapy

## 2020-06-26 ENCOUNTER — Ambulatory Visit (HOSPITAL_COMMUNITY): Payer: BC Managed Care – PPO | Admitting: Physical Therapy

## 2020-06-26 ENCOUNTER — Other Ambulatory Visit: Payer: Self-pay

## 2020-06-26 DIAGNOSIS — R2689 Other abnormalities of gait and mobility: Secondary | ICD-10-CM

## 2020-06-26 DIAGNOSIS — R29898 Other symptoms and signs involving the musculoskeletal system: Secondary | ICD-10-CM

## 2020-06-26 DIAGNOSIS — M6281 Muscle weakness (generalized): Secondary | ICD-10-CM

## 2020-06-26 DIAGNOSIS — M25572 Pain in left ankle and joints of left foot: Secondary | ICD-10-CM | POA: Diagnosis not present

## 2020-06-26 NOTE — Patient Instructions (Signed)
Access Code: [email or web address] URL: [email or web address] Date: 06/26/2020 Prepared by: Mitzi Hansen Edmond Ginsberg  Exercises Towel Scrunches - 1 x daily - 7 x weekly - 2 reps - 1 minute hold Ankle Inversion Eversion Towel Slide - 2 x daily - 7 x weekly - 2 reps - 1 minute hold Sidestepping - 1 x daily - 7 x weekly - 3 sets - 10 reps Tandem Stance - 1 x daily - 7 x weekly - 3 reps - 30 second hold
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
## 2020-06-27 ENCOUNTER — Telehealth: Payer: Self-pay

## 2020-06-27 NOTE — Telephone Encounter (Signed)
Pt notified and voiced understanding. Pt had no questions or concerns at this time. Results sent to PCP

## 2020-06-27 NOTE — Telephone Encounter (Signed)
-----   Message from Arnoldo Lenis, MD sent at 06/26/2020 11:01 AM EDT ----- Normal labs  Zandra Abts MD

## 2020-06-28 ENCOUNTER — Other Ambulatory Visit: Payer: Self-pay

## 2020-06-28 ENCOUNTER — Ambulatory Visit (HOSPITAL_COMMUNITY): Payer: BC Managed Care – PPO | Admitting: Physical Therapy

## 2020-06-28 ENCOUNTER — Encounter (HOSPITAL_COMMUNITY): Payer: Self-pay | Admitting: Physical Therapy

## 2020-06-28 DIAGNOSIS — R29898 Other symptoms and signs involving the musculoskeletal system: Secondary | ICD-10-CM

## 2020-06-28 DIAGNOSIS — M6281 Muscle weakness (generalized): Secondary | ICD-10-CM | POA: Diagnosis not present

## 2020-06-28 DIAGNOSIS — R2689 Other abnormalities of gait and mobility: Secondary | ICD-10-CM | POA: Diagnosis not present

## 2020-06-28 DIAGNOSIS — M25572 Pain in left ankle and joints of left foot: Secondary | ICD-10-CM | POA: Diagnosis not present

## 2020-06-28 DIAGNOSIS — Z681 Body mass index (BMI) 19 or less, adult: Secondary | ICD-10-CM | POA: Diagnosis not present

## 2020-06-28 DIAGNOSIS — J3089 Other allergic rhinitis: Secondary | ICD-10-CM | POA: Diagnosis not present

## 2020-06-28 DIAGNOSIS — J019 Acute sinusitis, unspecified: Secondary | ICD-10-CM | POA: Diagnosis not present
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
## 2020-06-28 NOTE — Patient Instructions (Signed)
Access Code: [email or web address] URL: [email or web address] Date: 06/28/2020 Prepared by: Mitzi Hansen Jermone Geister  Exercises Single Leg Stance - 1 x daily - 7 x weekly - 3 reps - 30 second hold

## 2020-07-03 ENCOUNTER — Other Ambulatory Visit: Payer: Self-pay

## 2020-07-03 ENCOUNTER — Ambulatory Visit (HOSPITAL_COMMUNITY): Payer: BC Managed Care – PPO

## 2020-07-03 ENCOUNTER — Encounter (HOSPITAL_COMMUNITY): Payer: Self-pay

## 2020-07-03 DIAGNOSIS — R29898 Other symptoms and signs involving the musculoskeletal system: Secondary | ICD-10-CM | POA: Diagnosis not present

## 2020-07-03 DIAGNOSIS — M25572 Pain in left ankle and joints of left foot: Secondary | ICD-10-CM | POA: Diagnosis not present

## 2020-07-03 DIAGNOSIS — R2689 Other abnormalities of gait and mobility: Secondary | ICD-10-CM

## 2020-07-03 DIAGNOSIS — M6281 Muscle weakness (generalized): Secondary | ICD-10-CM | POA: Diagnosis not present
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
## 2020-07-05 ENCOUNTER — Ambulatory Visit (HOSPITAL_COMMUNITY): Payer: BC Managed Care – PPO | Admitting: Physical Therapy

## 2020-07-06 ENCOUNTER — Telehealth: Payer: Self-pay | Admitting: *Deleted

## 2020-07-06 NOTE — Telephone Encounter (Signed)
Pt called in stating blood in stool at 4:00 this morning.  Bright red blood when wiping. Takes Linzess 72 mcg.  Stopped medication for 3 days and then started back taking every other day.  Took about 3 days to start working again.  Recent constipation.  Only one episode of blood in stool so far.

## 2020-07-07 NOTE — Telephone Encounter (Signed)
BRBPR likely due to hemorrhoids in setting of constipation. Colonoscopy 2020 reassuring. Would continue to monitor for now. Continue on Linzess. If she has large amount of bleeding or associated symptoms of fatigue, dizziness, chest pain, dyspnea, then she needs to come to the ER. thanks

## 2020-07-10 ENCOUNTER — Ambulatory Visit (HOSPITAL_COMMUNITY): Payer: BC Managed Care – PPO

## 2020-07-10 NOTE — Telephone Encounter (Signed)
Called pt and made her aware of Dr. Ave Filter recommendations.  Pt was advised to continue on Linzess and go to ER if larger amount of bleeding, fatigue, dizziness, chest pain, or dyspnea.  Pt voiced understanding.

## 2020-07-12 ENCOUNTER — Ambulatory Visit (HOSPITAL_COMMUNITY): Payer: BC Managed Care – PPO | Attending: Orthopedic Surgery | Admitting: Physical Therapy

## 2020-07-12 ENCOUNTER — Other Ambulatory Visit: Payer: Self-pay

## 2020-07-12 DIAGNOSIS — M6281 Muscle weakness (generalized): Secondary | ICD-10-CM | POA: Diagnosis not present

## 2020-07-12 DIAGNOSIS — M25572 Pain in left ankle and joints of left foot: Secondary | ICD-10-CM | POA: Insufficient documentation

## 2020-07-12 DIAGNOSIS — R2689 Other abnormalities of gait and mobility: Secondary | ICD-10-CM | POA: Diagnosis not present

## 2020-07-12 DIAGNOSIS — R29898 Other symptoms and signs involving the musculoskeletal system: Secondary | ICD-10-CM | POA: Insufficient documentation
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
## 2020-07-13 ENCOUNTER — Ambulatory Visit: Payer: BC Managed Care – PPO | Admitting: Family Medicine

## 2020-07-14 DIAGNOSIS — R609 Edema, unspecified: Secondary | ICD-10-CM | POA: Diagnosis not present

## 2020-07-14 DIAGNOSIS — I83813 Varicose veins of bilateral lower extremities with pain: Secondary | ICD-10-CM | POA: Diagnosis not present

## 2020-07-14 DIAGNOSIS — I8310 Varicose veins of unspecified lower extremity with inflammation: Secondary | ICD-10-CM | POA: Diagnosis not present

## 2020-07-16 ENCOUNTER — Other Ambulatory Visit: Payer: Self-pay | Admitting: *Deleted

## 2020-07-16 ENCOUNTER — Telehealth: Payer: Self-pay | Admitting: Internal Medicine

## 2020-07-16 DIAGNOSIS — K625 Hemorrhage of anus and rectum: Secondary | ICD-10-CM

## 2020-07-16 NOTE — Telephone Encounter (Signed)
Still with rectal bleeding.  Bleeding has picked up and filling toilet water when having bm's.  Took herself off of Xarelto for 3 days.  Dr. Abbey Chatters made aware and is calling pt.

## 2020-07-16 NOTE — Telephone Encounter (Signed)
Lmom for pt to call me back. 

## 2020-07-16 NOTE — Telephone Encounter (Signed)
Dr. Abbey Chatters spoke to pt.  He recommended CBC to be done at [facility] today or ASAP. Also recommends pt to have TCS (ASA 3).  Pt was advised to go to ER if any other symptoms such as increased bleeding, fatigue, dizziness, chest pain, or dyspnea.  Pt voiced understanding.    Dr. Abbey Chatters:  Would you like me to triage or direct schedule?  Last ov 04/11/2020.

## 2020-07-16 NOTE — Telephone Encounter (Signed)
Call patient, stated she is having blood in stool and she is cramping.

## 2020-07-17 ENCOUNTER — Ambulatory Visit (HOSPITAL_COMMUNITY): Payer: BC Managed Care – PPO | Admitting: Physical Therapy

## 2020-07-17 ENCOUNTER — Encounter (HOSPITAL_COMMUNITY): Payer: Self-pay | Admitting: Physical Therapy

## 2020-07-17 ENCOUNTER — Other Ambulatory Visit: Payer: Self-pay

## 2020-07-17 DIAGNOSIS — R29898 Other symptoms and signs involving the musculoskeletal system: Secondary | ICD-10-CM

## 2020-07-17 DIAGNOSIS — M25572 Pain in left ankle and joints of left foot: Secondary | ICD-10-CM

## 2020-07-17 DIAGNOSIS — R2689 Other abnormalities of gait and mobility: Secondary | ICD-10-CM

## 2020-07-17 DIAGNOSIS — M6281 Muscle weakness (generalized): Secondary | ICD-10-CM
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
## 2020-07-18 ENCOUNTER — Other Ambulatory Visit: Payer: Self-pay

## 2020-07-18 ENCOUNTER — Observation Stay (HOSPITAL_COMMUNITY)
Admission: EM | Admit: 2020-07-18 | Discharge: 2020-07-19 | Disposition: A | Discharge status: Home / Self Care | Payer: BC Managed Care – PPO | Attending: Family Medicine | Admitting: Family Medicine

## 2020-07-18 ENCOUNTER — Encounter (HOSPITAL_COMMUNITY): Payer: Self-pay | Admitting: Radiology

## 2020-07-18 ENCOUNTER — Emergency Department (HOSPITAL_COMMUNITY): Payer: BC Managed Care – PPO

## 2020-07-18 DIAGNOSIS — D122 Benign neoplasm of ascending colon: Principal | ICD-10-CM | POA: Insufficient documentation

## 2020-07-18 DIAGNOSIS — K5730 Diverticulosis of large intestine without perforation or abscess without bleeding: Secondary | ICD-10-CM | POA: Insufficient documentation

## 2020-07-18 DIAGNOSIS — Z87891 Personal history of nicotine dependence: Secondary | ICD-10-CM | POA: Insufficient documentation

## 2020-07-18 DIAGNOSIS — I480 Paroxysmal atrial fibrillation: Secondary | ICD-10-CM | POA: Diagnosis not present

## 2020-07-18 DIAGNOSIS — D649 Anemia, unspecified: Secondary | ICD-10-CM | POA: Diagnosis not present

## 2020-07-18 DIAGNOSIS — E039 Hypothyroidism, unspecified: Secondary | ICD-10-CM | POA: Insufficient documentation

## 2020-07-18 DIAGNOSIS — Z20822 Contact with and (suspected) exposure to covid-19: Secondary | ICD-10-CM | POA: Diagnosis not present

## 2020-07-18 DIAGNOSIS — Z79899 Other long term (current) drug therapy: Secondary | ICD-10-CM | POA: Insufficient documentation

## 2020-07-18 DIAGNOSIS — Z7901 Long term (current) use of anticoagulants: Secondary | ICD-10-CM | POA: Diagnosis not present

## 2020-07-18 DIAGNOSIS — I10 Essential (primary) hypertension: Secondary | ICD-10-CM | POA: Diagnosis not present

## 2020-07-18 DIAGNOSIS — K922 Gastrointestinal hemorrhage, unspecified: Secondary | ICD-10-CM | POA: Diagnosis not present

## 2020-07-18 DIAGNOSIS — K648 Other hemorrhoids: Secondary | ICD-10-CM | POA: Diagnosis not present

## 2020-07-18 DIAGNOSIS — K621 Rectal polyp: Secondary | ICD-10-CM | POA: Diagnosis not present

## 2020-07-18 DIAGNOSIS — Z8719 Personal history of other diseases of the digestive system: Secondary | ICD-10-CM | POA: Diagnosis present

## 2020-07-18 DIAGNOSIS — R109 Unspecified abdominal pain: Secondary | ICD-10-CM | POA: Diagnosis not present

## 2020-07-18 DIAGNOSIS — K760 Fatty (change of) liver, not elsewhere classified: Secondary | ICD-10-CM | POA: Diagnosis not present

## 2020-07-18 LAB — COMPREHENSIVE METABOLIC PANEL
ALT: 21 U/L (ref 0–44)
AST: 17 U/L (ref 15–41)
Albumin: 3.8 g/dL (ref 3.5–5.0)
Alkaline Phosphatase: 61 U/L (ref 38–126)
Anion gap: 9 (ref 5–15)
BUN: 15 mg/dL (ref 6–20)
CO2: 25 mmol/L (ref 22–32)
Calcium: 10.1 mg/dL (ref 8.9–10.3)
Chloride: 102 mmol/L (ref 98–111)
Creatinine, Ser: 1.03 mg/dL — ABNORMAL HIGH (ref 0.44–1.00)
GFR, Estimated: >60 mL/min (ref >60)
Glucose, Bld: 126 mg/dL — ABNORMAL HIGH (ref 70–99)
Potassium: 3.8 mmol/L (ref 3.5–5.1)
Sodium: 136 mmol/L (ref 135–145)
Total Bilirubin: 0.8 mg/dL (ref 0.3–1.2)
Total Protein: 6.6 g/dL (ref 6.5–8.1)

## 2020-07-18 LAB — CBC WITH DIFFERENTIAL/PLATELET
Abs Immature Granulocytes: 0.02 10*3/uL (ref 0.00–0.07)
Basophils Absolute: 0 10*3/uL (ref 0.0–0.1)
Basophils Relative: 0 %
Eosinophils Absolute: 0.3 10*3/uL (ref 0.0–0.5)
Eosinophils Relative: 4 %
HCT: 34.2 % — ABNORMAL LOW (ref 36.0–46.0)
Hemoglobin: 11.3 g/dL — ABNORMAL LOW (ref 12.0–15.0)
Immature Granulocytes: 0 %
Lymphocytes Relative: 34 %
Lymphs Abs: 2.3 10*3/uL (ref 0.7–4.0)
MCH: 31.5 pg (ref 26.0–34.0)
MCHC: 33 g/dL (ref 30.0–36.0)
MCV: 95.3 fL (ref 80.0–100.0)
Monocytes Absolute: 0.6 10*3/uL (ref 0.1–1.0)
Monocytes Relative: 9 %
Neutro Abs: 3.6 10*3/uL (ref 1.7–7.7)
Neutrophils Relative %: 53 %
Platelets: 317 10*3/uL (ref 150–400)
RBC: 3.59 MIL/uL — ABNORMAL LOW (ref 3.87–5.11)
RDW: 12.9 % (ref 11.5–15.5)
WBC: 6.9 10*3/uL (ref 4.0–10.5)
nRBC: 0 % (ref 0.0–0.2)

## 2020-07-18 LAB — POC OCCULT BLOOD, ED: Fecal Occult Bld: POSITIVE — AB

## 2020-07-18 LAB — RESP PANEL BY RT-PCR (FLU A&B, COVID) ARPGX2
Influenza A by PCR: NEGATIVE
Influenza B by PCR: NEGATIVE
SARS Coronavirus 2 by RT PCR: NEGATIVE

## 2020-07-18 LAB — PROTIME-INR
INR: 1 (ref 0.8–1.2)
Prothrombin Time: 12.9 seconds (ref 11.4–15.2)

## 2020-07-18 LAB — SAMPLE TO BLOOD BANK

## 2020-07-18 LAB — HEMOGLOBIN AND HEMATOCRIT, BLOOD
HCT: 35.4 % — ABNORMAL LOW (ref 36.0–46.0)
Hemoglobin: 11.6 g/dL — ABNORMAL LOW (ref 12.0–15.0)

## 2020-07-18 MED ORDER — IRBESARTAN 150 MG PO TABS
300.0000 mg | ORAL_TABLET | Freq: Every day | ORAL | Status: DC
  Administered 2020-07-19: 300 mg via ORAL
  Filled 2020-07-18: qty 2

## 2020-07-18 MED ORDER — FLUTICASONE PROPIONATE 50 MCG/ACT NA SUSP: 2.0000 | Freq: Every day | NASAL | Status: DC | PRN

## 2020-07-18 MED ORDER — SPIRONOLACTONE 25 MG PO TABS
25.0000 mg | ORAL_TABLET | Freq: Every day | ORAL | Status: DC
  Administered 2020-07-19: 25 mg via ORAL
  Filled 2020-07-18: qty 1

## 2020-07-18 MED ORDER — METOPROLOL TARTRATE 25 MG PO TABS
25.0000 mg | ORAL_TABLET | Freq: Two times a day (BID) | ORAL | Status: DC
  Filled 2020-07-18: qty 1

## 2020-07-18 MED ORDER — DILTIAZEM HCL ER COATED BEADS 180 MG PO CP24
360.0000 mg | ORAL_CAPSULE | Freq: Every day | ORAL | Status: DC
  Administered 2020-07-19: 360 mg via ORAL
  Filled 2020-07-18: qty 2

## 2020-07-18 MED ORDER — SODIUM CHLORIDE 0.9% FLUSH
3.0000 mL | Freq: Two times a day (BID) | INTRAVENOUS | Status: DC
  Administered 2020-07-19: 3 mL via INTRAVENOUS

## 2020-07-18 MED ORDER — PANTOPRAZOLE SODIUM 40 MG PO TBEC
40.0000 mg | DELAYED_RELEASE_TABLET | Freq: Every day | ORAL | Status: DC
  Administered 2020-07-18 – 2020-07-19 (×2): 40 mg via ORAL
  Filled 2020-07-18 (×2): qty 1

## 2020-07-18 MED ORDER — ACETAMINOPHEN 325 MG PO TABS
650.0000 mg | ORAL_TABLET | Freq: Four times a day (QID) | ORAL | Status: DC | PRN
  Administered 2020-07-18: 650 mg via ORAL

## 2020-07-18 MED ORDER — ACETAMINOPHEN 650 MG RE SUPP: 650.0000 mg | Freq: Four times a day (QID) | RECTAL | Status: DC | PRN

## 2020-07-18 MED ORDER — ONDANSETRON HCL 4 MG PO TABS: 4.0000 mg | ORAL_TABLET | Freq: Four times a day (QID) | ORAL | Status: DC | PRN

## 2020-07-18 MED ORDER — IOHEXOL 300 MG/ML  SOLN
100.0000 mL | Freq: Once | INTRAMUSCULAR | Status: AC | PRN
  Administered 2020-07-18: 100 mL via INTRAVENOUS

## 2020-07-18 MED ORDER — ONDANSETRON HCL 4 MG/2ML IJ SOLN: 4.0000 mg | Freq: Four times a day (QID) | INTRAMUSCULAR | Status: DC | PRN

## 2020-07-18 MED ORDER — PEG 3350-KCL-NA BICARB-NACL 420 G PO SOLR
4000.0000 mL | Freq: Once | ORAL | Status: AC
  Administered 2020-07-18: 4000 mL via ORAL

## 2020-07-18 MED ORDER — ROSUVASTATIN CALCIUM 10 MG PO TABS
5.0000 mg | ORAL_TABLET | Freq: Every day | ORAL | Status: DC
  Administered 2020-07-19: 5 mg via ORAL
  Filled 2020-07-18: qty 1

## 2020-07-18 MED ORDER — MONTELUKAST SODIUM 10 MG PO TABS: 10.0000 mg | ORAL_TABLET | Freq: Every day | ORAL | Status: DC

## 2020-07-18 MED ORDER — IPRATROPIUM BROMIDE 0.03 % NA SOLN
1.0000 | Freq: Two times a day (BID) | NASAL | Status: DC
  Administered 2020-07-19: 1 via NASAL
  Filled 2020-07-18: qty 30

## 2020-07-18 MED ORDER — ALBUTEROL SULFATE (2.5 MG/3ML) 0.083% IN NEBU: 2.5000 mg | INHALATION_SOLUTION | RESPIRATORY_TRACT | Status: DC | PRN

## 2020-07-18 MED ORDER — LIFITEGRAST 5 % OP SOLN: 1.0000 [drp] | Freq: Two times a day (BID) | OPHTHALMIC | Status: DC | PRN

## 2020-07-18 MED ORDER — DICLOFENAC SODIUM 1 % EX GEL
1.0000 "application " | Freq: Four times a day (QID) | CUTANEOUS | Status: DC | PRN
  Filled 2020-07-18: qty 100

## 2020-07-18 NOTE — ED Notes (Signed)
Patient transported to CT 

## 2020-07-18 NOTE — Telephone Encounter (Signed)
Please go ahead and schedule patient for colonoscopy, ASA 3.  Thank you
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
## 2020-07-18 NOTE — ED Triage Notes (Signed)
Pt to the ED POV with complaints of rectal bleeding.  The pt states it started at the beginning of July and has gotten progressively worse. She states it may be from hemorrhoids.

## 2020-07-18 NOTE — ED Notes (Signed)
Returned from CT.

## 2020-07-19 ENCOUNTER — Observation Stay (HOSPITAL_COMMUNITY): Payer: BC Managed Care – PPO | Admitting: Anesthesiology

## 2020-07-19 ENCOUNTER — Encounter (HOSPITAL_COMMUNITY): Payer: Self-pay | Admitting: Family Medicine

## 2020-07-19 ENCOUNTER — Ambulatory Visit (HOSPITAL_COMMUNITY): Payer: BC Managed Care – PPO | Admitting: Physical Therapy

## 2020-07-19 ENCOUNTER — Encounter (HOSPITAL_COMMUNITY)
Admission: EM | Disposition: A | Discharge status: Home / Self Care | Payer: Self-pay | Source: Home / Self Care | Attending: Family Medicine

## 2020-07-19 DIAGNOSIS — D122 Benign neoplasm of ascending colon: Secondary | ICD-10-CM | POA: Diagnosis not present

## 2020-07-19 DIAGNOSIS — I4891 Unspecified atrial fibrillation: Secondary | ICD-10-CM | POA: Diagnosis not present

## 2020-07-19 DIAGNOSIS — D649 Anemia, unspecified: Secondary | ICD-10-CM

## 2020-07-19 DIAGNOSIS — K921 Melena: Secondary | ICD-10-CM

## 2020-07-19 DIAGNOSIS — K922 Gastrointestinal hemorrhage, unspecified: Secondary | ICD-10-CM | POA: Diagnosis not present

## 2020-07-19 DIAGNOSIS — K635 Polyp of colon: Secondary | ICD-10-CM | POA: Diagnosis not present

## 2020-07-19 DIAGNOSIS — K621 Rectal polyp: Secondary | ICD-10-CM | POA: Diagnosis not present

## 2020-07-19 HISTORY — PX: POLYPECTOMY: SHX5525

## 2020-07-19 HISTORY — PX: COLONOSCOPY WITH PROPOFOL: SHX5780

## 2020-07-19 LAB — CBC
HCT: 32.4 % — ABNORMAL LOW (ref 36.0–46.0)
Hemoglobin: 10.8 g/dL — ABNORMAL LOW (ref 12.0–15.0)
MCH: 31.8 pg (ref 26.0–34.0)
MCHC: 33.3 g/dL (ref 30.0–36.0)
MCV: 95.3 fL (ref 80.0–100.0)
Platelets: 303 10*3/uL (ref 150–400)
RBC: 3.4 MIL/uL — ABNORMAL LOW (ref 3.87–5.11)
RDW: 12.7 % (ref 11.5–15.5)
WBC: 5.9 10*3/uL (ref 4.0–10.5)
nRBC: 0 % (ref 0.0–0.2)

## 2020-07-19 LAB — BASIC METABOLIC PANEL
Anion gap: 6 (ref 5–15)
BUN: 11 mg/dL (ref 6–20)
CO2: 27 mmol/L (ref 22–32)
Calcium: 9.1 mg/dL (ref 8.9–10.3)
Chloride: 106 mmol/L (ref 98–111)
Creatinine, Ser: 0.93 mg/dL (ref 0.44–1.00)
GFR, Estimated: >60 mL/min (ref >60)
Glucose, Bld: 106 mg/dL — ABNORMAL HIGH (ref 70–99)
Potassium: 3.6 mmol/L (ref 3.5–5.1)
Sodium: 139 mmol/L (ref 135–145)

## 2020-07-19 LAB — HIV ANTIBODY (ROUTINE TESTING W REFLEX): HIV Screen 4th Generation wRfx: NONREACTIVE

## 2020-07-19 SURGERY — COLONOSCOPY WITH PROPOFOL
Anesthesia: General

## 2020-07-19 MED ORDER — PROPOFOL 500 MG/50ML IV EMUL
INTRAVENOUS | Status: DC | PRN
  Administered 2020-07-19: 100 ug/kg/min via INTRAVENOUS

## 2020-07-19 MED ORDER — LIDOCAINE HCL (CARDIAC) PF 100 MG/5ML IV SOSY
PREFILLED_SYRINGE | INTRAVENOUS | Status: DC | PRN
  Administered 2020-07-19: 50 mg via INTRAVENOUS

## 2020-07-19 MED ORDER — LACTATED RINGERS IV SOLN: INTRAVENOUS | Status: DC

## 2020-07-19 MED ORDER — PROPOFOL 10 MG/ML IV BOLUS
INTRAVENOUS | Status: DC | PRN
  Administered 2020-07-19: 100 mg via INTRAVENOUS
  Administered 2020-07-19 (×2): 50 mg via INTRAVENOUS

## 2020-07-19 MED ORDER — SODIUM CHLORIDE 0.9 % IV SOLN: INTRAVENOUS | Status: DC

## 2020-07-19 NOTE — Telephone Encounter (Signed)
Spoke to Dr. Abbey Chatters and pt is having procedure today.

## 2020-07-19 NOTE — Anesthesia Procedure Notes (Signed)
Date/Time: 07/19/2020 2:55 PM Performed by: Orlie Dakin, CRNA Pre-anesthesia Checklist: Patient identified, Emergency Drugs available, Suction available and Patient being monitored Patient Re-evaluated:Patient Re-evaluated prior to induction Oxygen Delivery Method: Nasal cannula Induction Type: IV induction Placement Confirmation: positive ETCO2

## 2020-07-19 NOTE — Addendum Note (Signed)
Addendum  created 07/19/20 1606 by Orlie Dakin, CRNA   Flowsheet accepted

## 2020-07-19 NOTE — Interval H&P Note (Signed)
History and Physical Interval Note:  07/19/2020 2:41 PM  Becky [name]  has presented today for surgery, with the diagnosis of Anemia, rectal bleeding.  The various methods of treatment have been discussed with the patient and family. After consideration of risks, benefits and other options for treatment, the patient has consented to  Procedure(s): COLONOSCOPY WITH PROPOFOL (N/A) as a surgical intervention.  The patient's history has been reviewed, patient examined, no change in status, stable for surgery.  I have reviewed the patient's chart and labs.  Questions were answered to the patient's satisfaction.     Manus Rudd  Patient seen and examined in short stay.  Hemoglobin 10.8.  Remains hemodynamically stable. Abdomen is soft and nontender.  Plan for diagnostic colonoscopy today.  The risks, benefits, limitations, alternatives and imponderables have been reviewed with the patient. Questions have been answered. All parties are agreeable.

## 2020-07-19 NOTE — Anesthesia Postprocedure Evaluation (Signed)
Anesthesia Post Note  [name]  Procedure(s) Performed: COLONOSCOPY WITH PROPOFOL POLYPECTOMY  Patient location during evaluation: Phase II Anesthesia Type: General Level of consciousness: awake Pain management: pain level controlled Vital Signs Assessment: post-procedure vital signs reviewed and stable Respiratory status: spontaneous breathing and respiratory function stable Cardiovascular status: blood pressure returned to baseline and stable Postop Assessment: no headache and no apparent nausea or vomiting Anesthetic complications: no Comments: Late entry   No notable events documented.   Last Vitals:  Vitals:   07/19/20 1545 07/19/20 1546  BP: (!) 152/98 (!) 160/85  Pulse: 61 61  Resp: 14 17  Temp:  36.8 C  SpO2: 99% 99%    Last Pain:  Vitals:   07/19/20 1546  TempSrc:   PainSc: 0-No pain                 [name]

## 2020-07-19 NOTE — Op Note (Signed)
[facility] Patient Name: Becky [name] Procedure Date: 07/19/2020 2:35 PM MRN: [id number] Date of Birth: 11/01/1960 Attending MD: Norvel Richards , MD CSN: [id number] Age: 60 Admit Type: Inpatient Procedure:                Colonoscopy Indications:              Hematochezia Providers:                Norvel Richards, MD, Jeanann Lewandowsky. Sharon Seller, RN,                            Nelma Rothman, Technician Referring MD:              Medicines:                Propofol per Anesthesia Complications:            No immediate complications. Estimated Blood Loss:     Estimated blood loss was minimal. Estimated blood                            loss was minimal. Procedure:                Pre-Anesthesia Assessment:                           - Prior to the procedure, a History and Physical                            was performed, and patient medications and                            allergies were reviewed. The patient's tolerance of                            previous anesthesia was also reviewed. The risks                            and benefits of the procedure and the sedation                            options and risks were discussed with the patient.                            All questions were answered, and informed consent                            was obtained. Prior Anticoagulants: The patient                            last took Eliquis (apixaban) 2 days prior to the                            procedure. ASA Grade Assessment: III - A patient  with severe systemic disease. After reviewing the                            risks and benefits, the patient was deemed in                            satisfactory condition to undergo the procedure.                           After obtaining informed consent, the colonoscope                            was passed under direct vision. Throughout the                            procedure, the patient's blood  pressure, pulse, and                            oxygen saturations were monitored continuously. The                            [id number]-[id number] ([id number]) scope was introduced through                            the anus and advanced to the 5 cm into the ileum.                            The colonoscopy was performed without difficulty.                            The patient tolerated the procedure well. The                            quality of the bowel preparation was adequate. The                            terminal ileum, ileocecal valve, appendiceal                            orifice, and rectum were photographed. The terminal                            ileum, ileocecal valve, appendiceal orifice, and                            rectum were photographed. Scope In: 2:53:32 PM Scope Out: 3:11:32 PM Scope Withdrawal Time: 0 hours 11 minutes 46 seconds  Total Procedure Duration: 0 hours 18 minutes 0 seconds  Findings:      The perianal and digital rectal examinations were normal.      Scattered small and large-mouthed diverticula were found in the sigmoid       colon and descending colon.      Three sessile polyps were found in the distal sigmoid colon and  ascending colon. The polyps were 4 to 5 mm in size. These polyps were       removed with a cold snare. Resection and retrieval were complete.       Estimated blood loss was minimal.      The exam was otherwise without abnormality on direct and retroflexion       views. Distal 5 cm of terminal ileum appeared normal.      Non-bleeding internal hemorrhoids were found during retroflexion. The       hemorrhoids were mild, small and Grade I (internal hemorrhoids that do       not prolapse). No blood or clot found anywhere in the colon or terminal       ileum. Impression:               - Diverticulosis in the sigmoid colon and in the                            descending colon.                           - Three 4 to 5 mm polyps in the sigmoid  colon, in                            the distal sigmoid colon and in the ascending                            colon, removed with a cold snare. Resected and                            retrieved.                           - The examination was otherwise normal on direct                            and retroflexion views. Normal-appearing terminal                            ileum. With cessation of bleeding and a relatively                            minor drop in hemoglobin, I suspect trivial GI                            bleeding either from hemorrhoids or diverticula                            (the former more likely than the latter). Moderate Sedation:      Moderate (conscious) sedation was personally administered by an       anesthesia professional. The following parameters were monitored: oxygen       saturation, heart rate, blood pressure, respiratory rate, EKG, adequacy       of pulmonary ventilation, and response to care. Recommendation:           - Return patient to hospital ward for possible  discharge same day.                           - Diabetic (ADA) diet.                           - Continue present medications.                           - Repeat colonoscopy date to be determined after                            pending pathology results are reviewed for                            surveillance based on pathology results.                           - Return to GI office after studies are complete.                            Take Linzess every day and avoid constipation. Add                            Benefiber daily as well. From a GI standpoint,                            could be discharged later today. I recommend she                            resume her Xarelto tomorrow as clinically                            indicated. At patient request, I called Ethelene Hal, daughter, at [phone]. Reviewed my                             findings and recommendations. Procedure Code(s):        --- Professional ---                           [phone], Colonoscopy, flexible; with removal of                            tumor(s), polyp(s), or other lesion(s) by snare                            technique Diagnosis Code(s):        --- Professional ---                           K63.5, Polyp of colon  K92.1, Melena (includes Hematochezia)                           K57.30, Diverticulosis of large intestine without                            perforation or abscess without bleeding CPT copyright 2019 American Medical Association. All rights reserved. The codes documented in this report are preliminary and upon coder review may  be revised to meet current compliance requirements. Cristopher Estimable. Carolie Mcilrath, MD Norvel Richards, MD 07/19/2020 3:27:28 PM This report has been signed electronically. Number of Addenda: 0

## 2020-07-19 NOTE — Transfer of Care (Signed)
Immediate Anesthesia Transfer of Care Note  [name]  Procedure(s) Performed: COLONOSCOPY WITH PROPOFOL POLYPECTOMY  Patient Location: PACU  Anesthesia Type:General  Level of Consciousness: awake, alert  and oriented  Airway & Oxygen Therapy: Patient Spontanous Breathing  Post-op Assessment: Report given to RN and Post -op Vital signs reviewed and stable  Post vital signs: Reviewed and stable  Last Vitals:  Vitals Value Taken Time  BP    Temp    Pulse    Resp    SpO2      Last Pain:  Vitals:   07/19/20 1450  TempSrc:   PainSc: 0-No pain         Complications: No notable events documented.

## 2020-07-19 NOTE — Discharge Summary (Signed)
Physician Discharge Summary  Shailey Butterbaugh HEN:[id number] DOB: 15-Nov-1960 DOA: 07/18/2020  PCP: Redmond School, MD  Admit date: 07/18/2020 Discharge date: 07/19/2020  Admitted From: Home Disposition: Home   Recommendations for Outpatient Follow-up:  Follow up with PCP in 1-2 weeks Follow up with GI after tests back. Repeat colonoscopy date to be determined after pending pathology results are reviewed for surveillance based on pathology results. Take Linzess every day and avoid constipation. Add Benefiber daily as well.  Resume her Xarelto tomorrow as clinically indicated.  Home Health: None Equipment/Devices: None Discharge Condition: Stable CODE STATUS: Full Diet recommendation: Heart healthy  Brief/Interim Summary: Becky [name] is a 60 y.o. female with a history of diverticulosis, HTN, HLD, obesity, PAF on xarelto, OSA who presented to the ED with nearly 2 weeks of increasing rectal/GI bleeding. She stopped taking xarelto 5 days ago. Last EGD 05/22/2020 w/esophageal dilatation, no gastritis or PUD. Last colonoscopy 2020 w/left-sided diverticulosis. She was hemodynamically stable with Hgb slightly lower than previous at 11.3g/dl. +FOBT. Diverticulosis without diverticulitis on CT. GI performed colonoscopy on 7/14 which revealed no active bleeding. Polyps were resected. Hemorrhoids are favored etiology of low level GI bleeding, also possible diverticular, though bleeding has ceased. Cleared for discharge after colonoscopy with plans to restart xarelto 7/15.  Discharge Diagnoses:  Active Problems:   GI bleeding   Anemia  GI bleeding: Most consistent with lower source by history, BUN only 15, hx diverticulosis. Hemorrhoid bleeding is favored etiology after colonoscopy (report below) - Return precautions - Restart xarelto 7/15 per GI.   Acute on chronic blood loss anemia: - Repeat CBC in 1 week.   Paroxysmal atrial fibrillation: CHA2DS2-VASc score is 2 (HTN, sex). Symptomatic  during episodes. Pt of Dr. Harl Bowie. - Continue metoprolol and diltiazem (hold for bradycardia) - I recommend the patient discuss options with cardiology going forward. Perhaps eliquis would present lower risk of recurrent GI bleeding.   Rhinorrhea, congestion: Has completed abx for presumed sinus infection.   OSA: - Continue CPAP qHS, followed by [facility].   HTN: - Continue ARB, metoprolol, diltiazem, spironolactone, prn lasix.   HLD: - Continue statin   Obesity: Estimated body mass index is 40.6 kg/m2  Discharge Instructions Discharge Instructions     Diet Carb Modified   Complete by: As directed    Discharge instructions   Complete by: As directed    - Follow up with your PCP and/or GI soon for repeat blood count monitoring.  - Resume xarelto tomorrow - Follow up with your cardiologist to discuss alternative options for blood thinners - Follow up with GI in the next few weeks or seek advice right away if your bleeding starts up again. - Continue linzess and add benefiber (over the counter) targeting a BM at least daily, avoid constipation.      Allergies as of 07/19/2020       Reactions   Hydralazine Nausea Only   Other    Band aids discolor skin ekg pads irritate skin    Prednisone Swelling   Patient states that she can take methylprednisolone without complications        Medication List     STOP taking these medications    Armour Thyroid 90 MG tablet Generic drug: thyroid   polyethylene glycol-electrolytes 420 g solution Commonly known as: NuLYTELY       TAKE these medications    acetaminophen 500 MG tablet Commonly known as: TYLENOL Take 1,000 mg by mouth every 8 (eight) hours as needed for moderate pain  or headache.   budesonide 32 MCG/ACT nasal spray Commonly known as: RHINOCORT AQUA Place 1 spray into both nostrils daily as needed for rhinitis.   cholecalciferol 25 MCG (1000 UNIT) tablet Commonly known as: VITAMIN D3 Take 1,000 Units by mouth  daily.   Dexilant 60 MG capsule Generic drug: dexlansoprazole TAKE 1 CAPSULE BY MOUTH EVERY DAY What changed: how much to take   diclofenac Sodium 1 % Gel Commonly known as: VOLTAREN Apply 1 application topically 4 (four) times daily as needed (pain).   diltiazem 360 MG 24 hr capsule Commonly known as: Cardizem CD Take 1 capsule (360 mg total) by mouth daily.   EPINEPHrine 0.3 mg/0.3 mL Soaj injection Commonly known as: EPI-PEN Inject 0.3 mg into the muscle once as needed for anaphylaxis.   fluticasone 50 MCG/ACT nasal spray Commonly known as: FLONASE Place 2 sprays into both nostrils daily. What changed:  when to take this reasons to take this   furosemide 40 MG tablet Commonly known as: Lasix Take 1 tablet (40 mg total) by mouth daily as needed. What changed: reasons to take this   linaclotide 72 MCG capsule Commonly known as: Linzess Take 1 capsule (72 mcg total) by mouth daily before breakfast.   metoprolol tartrate 25 MG tablet Commonly known as: LOPRESSOR Take 1 tablet (25 mg total) by mouth 2 (two) times daily.   montelukast 10 MG tablet Commonly known as: SINGULAIR Take 10 mg by mouth daily.   olmesartan 40 MG tablet Commonly known as: Benicar Take 1 tablet (40 mg total) by mouth daily.   ondansetron 4 MG tablet Commonly known as: Zofran Take 1 tablet (4 mg total) by mouth every 8 (eight) hours as needed for nausea or vomiting.   polyethylene glycol 17 g packet Commonly known as: MIRALAX / GLYCOLAX Take 17 g by mouth daily as needed for moderate constipation.   REFRESH DRY EYE THERAPY OP Apply 1 drop to eye daily as needed (dry eyes).   rosuvastatin 5 MG tablet Commonly known as: CRESTOR Take 1 tablet (5 mg total) by mouth daily.   spironolactone 25 MG tablet Commonly known as: ALDACTONE TAKE 1 TABLET BY MOUTH EVERY DAY What changed:  how much to take how to take this when to take this   Xarelto 20 MG Tabs tablet Generic drug:  rivaroxaban TAKE 1 TABLET BY MOUTH EVERY DAY   Xiidra 5 % Soln Generic drug: Lifitegrast Place 1 drop into both eyes 2 (two) times daily as needed (dry eyes).        Follow-up Information     Eloise Harman, DO Follow up.   Specialty: Gastroenterology Contact information: [place] [address] [phone]                Allergies  Allergen Reactions   Hydralazine Nausea Only   Other     Band aids discolor skin ekg pads irritate skin    Prednisone Swelling    Patient states that she can take methylprednisolone without complications    Consultations: GI  Procedures/Studies: CT ABDOMEN PELVIS W CONTRAST  Result Date: 07/18/2020 CLINICAL DATA:  GI bleed. Concern for diverticulitis. Abdominal pain EXAM: CT ABDOMEN AND PELVIS WITH CONTRAST TECHNIQUE: Multidetector CT imaging of the abdomen and pelvis was performed using the standard protocol following bolus administration of intravenous contrast. CONTRAST:  121mL OMNIPAQUE IOHEXOL 300 MG/ML  SOLN COMPARISON:  01/14/2020 FINDINGS: Lower chest: Lung bases are clear. No effusions. Heart is normal size. Hepatobiliary: Diffuse low-density  throughout the liver compatible with fatty infiltration. No focal abnormality. Gallbladder unremarkable. Pancreas: No focal abnormality or ductal dilatation. Spleen: No focal abnormality.  Normal size. Adrenals/Urinary Tract: Small left adrenal nodule measures 16 mm and is stable since prior study. No stones or hydronephrosis. Right adrenal gland and urinary bladder unremarkable. Stomach/Bowel: Left colonic diverticulosis. No changes of active diverticulitis. Stomach and small bowel decompressed. Normal appendix. Vascular/Lymphatic: Aortoiliac atherosclerosis. No evidence of aneurysm or adenopathy. Reproductive: Uterus and adnexa unremarkable.  No mass. Other: No free fluid or free air. Musculoskeletal: No acute bony abnormality. IMPRESSION: Left colonic diverticulosis.  No active  diverticulitis. Hepatic steatosis. Stable left adrenal nodule, nonspecific but most likely adenoma. Aortoiliac atherosclerosis. Electronically Signed   By: Rolm Baptise M.D.   On: 07/18/2020 11:13    Colonoscopy 07/19/2020 Dr. Gala Romney:  Impression:        - Diverticulosis in the sigmoid colon and in the                           descending colon.                           - Three 4 to 5 mm polyps in the sigmoid colon, in                           the distal sigmoid colon and in the ascending                           colon, removed with a cold snare. Resected and                           retrieved.                           - The examination was otherwise normal on direct                           and retroflexion views. Normal-appearing terminal                           ileum. With cessation of bleeding and a relatively                           minor drop in hemoglobin, I suspect trivial GI                           bleeding either from hemorrhoids or diverticula                           (the former more likely than the latter).  Recommendation: - Return patient to hospital ward for possible                           discharge same day.                           - Diabetic (ADA) diet.                           -  Continue present medications.                           - Repeat colonoscopy date to be determined after                           pending pathology results are reviewed for                           surveillance based on pathology results.                           - Return to GI office after studies are complete.                           Take Linzess every day and avoid constipation. Add                           Benefiber daily as well. From a GI standpoint,                           could be discharged later today. I recommend she                           resume her Xarelto tomorrow as clinically                           indicated. At patient request, I called Ethelene Hal, daughter, at [phone]. Reviewed my                           findings and recommendations.  Subjective: Denies any bleeding, last BMs have been normal. No chest pain or dyspnea.   Discharge Exam: Vitals:   07/19/20 1545 07/19/20 1546  BP: (!) 152/98 (!) 160/85  Pulse: 61 61  Resp: 14 17  Temp:  98.2 F (36.8 C)  SpO2: 99% 99%   Gen: No distress Pulm: Clear and nonlabored on room air  CV: RRR, no murmur, no JVD, no edema GI: Soft, NT, ND, +BS Neuro: Alert and oriented. No focal deficits. Skin: No rashes, lesions or ulcers  Labs: BNP (last 3 results) No results for input(s): BNP in the last 8760 hours. Basic Metabolic Panel: Recent Labs  Lab 07/18/20 0956 07/19/20 0439  NA 136 139  K 3.8 3.6  CL 102 106  CO2 25 27  GLUCOSE 126* 106*  BUN 15 11  CREATININE 1.03* 0.93  CALCIUM 10.1 9.1   Liver Function Tests: Recent Labs  Lab 07/18/20 0956  AST 17  ALT 21  ALKPHOS 61  BILITOT 0.8  PROT 6.6  ALBUMIN 3.8   No results for input(s): LIPASE, AMYLASE in the last 168 hours. No results for input(s): AMMONIA in the last 168 hours. CBC: Recent Labs  Lab 07/18/20 0952 07/18/20 1722 07/19/20 0439  WBC 6.9  --  5.9  NEUTROABS 3.6  --   --   HGB 11.3* 11.6* 10.8*  HCT 34.2*  35.4* 32.4*  MCV 95.3  --  95.3  PLT 317  --  303   Cardiac Enzymes: No results for input(s): CKTOTAL, CKMB, CKMBINDEX, TROPONINI in the last 168 hours. BNP: Invalid input(s): POCBNP CBG: No results for input(s): GLUCAP in the last 168 hours. D-Dimer No results for input(s): DDIMER in the last 72 hours. Hgb A1c No results for input(s): HGBA1C in the last 72 hours. Lipid Profile No results for input(s): CHOL, HDL, LDLCALC, TRIG, CHOLHDL, LDLDIRECT in the last 72 hours. Thyroid function studies No results for input(s): TSH, T4TOTAL, T3FREE, THYROIDAB in the last 72 hours.  Invalid input(s): FREET3 Anemia work up No results for input(s): VITAMINB12,  FOLATE, FERRITIN, TIBC, IRON, RETICCTPCT in the last 72 hours. Urinalysis    Component Value Date/Time   COLORURINE YELLOW 07/30/2018 1038   APPEARANCEUR CLEAR 07/30/2018 1038   APPEARANCEUR Clear 01/01/2017 1400   LABSPEC 1.015 07/30/2018 1038   PHURINE 5.0 07/30/2018 1038   GLUCOSEU NEGATIVE 07/30/2018 1038   HGBUR LARGE (A) 07/30/2018 1038   BILIRUBINUR NEGATIVE 07/30/2018 1038   BILIRUBINUR Negative 01/01/2017 1400   KETONESUR NEGATIVE 07/30/2018 1038   PROTEINUR NEGATIVE 07/30/2018 1038   UROBILINOGEN 0.2 01/16/2018 1308   NITRITE NEGATIVE 07/30/2018 [place] 07/30/2018 1038    Microbiology Recent Results (from the past 240 hour(s))  Resp Panel by RT-PCR (Flu A&B, Covid) Nasopharyngeal Swab     Status: None   Collection Time: 07/18/20  1:39 PM   Specimen: Nasopharyngeal Swab; Nasopharyngeal(NP) swabs in vial transport medium  Result Value Ref Range Status   SARS Coronavirus 2 by RT PCR NEGATIVE NEGATIVE Final    Comment: (NOTE) SARS-CoV-2 target nucleic acids are NOT DETECTED.  The SARS-CoV-2 RNA is generally detectable in upper respiratory specimens during the acute phase of infection. The lowest concentration of SARS-CoV-2 viral copies this assay can detect is 138 copies/mL. A negative result does not preclude SARS-Cov-2 infection and should not be used as the sole basis for treatment or other patient management decisions. A negative result may occur with  improper specimen collection/handling, submission of specimen other than nasopharyngeal swab, presence of viral mutation(s) within the areas targeted by this assay, and inadequate number of viral copies(<138 copies/mL). A negative result must be combined with clinical observations, patient history, and epidemiological information. The expected result is Negative.  Fact Sheet for Patients:  EntrepreneurPulse.com.au  Fact Sheet for Healthcare Providers:   IncredibleEmployment.be  This test is no t yet approved or cleared by the [place] FDA and  has been authorized for detection and/or diagnosis of SARS-CoV-2 by FDA under an Emergency Use Authorization (EUA). This EUA will remain  in effect (meaning this test can be used) for the duration of the COVID-19 declaration under Section 564(b)(1) of the Act, 21 U.S.C.section 360bbb-3(b)(1), unless the authorization is terminated  or revoked sooner.       Influenza A by PCR NEGATIVE NEGATIVE Final   Influenza B by PCR NEGATIVE NEGATIVE Final    Comment: (NOTE) The Xpert Xpress SARS-CoV-2/FLU/RSV plus assay is intended as an aid in the diagnosis of influenza from Nasopharyngeal swab specimens and should not be used as a sole basis for treatment. Nasal washings and aspirates are unacceptable for Xpert Xpress SARS-CoV-2/FLU/RSV testing.  Fact Sheet for Patients: EntrepreneurPulse.com.au  Fact Sheet for Healthcare Providers: IncredibleEmployment.be  This test is not yet approved or cleared by the [place] FDA and has been authorized for detection and/or diagnosis of SARS-CoV-2 by FDA under  an Emergency Use Authorization (EUA). This EUA will remain in effect (meaning this test can be used) for the duration of the COVID-19 declaration under Section 564(b)(1) of the Act, 21 U.S.C. section 360bbb-3(b)(1), unless the authorization is terminated or revoked.  Performed at [facility], [address], [place], [place] [address]     Time coordinating discharge: Approximately 40 minutes  Patrecia Pour, MD  [facility] 07/19/2020, 4:06 PM

## 2020-07-19 NOTE — Progress Notes (Signed)
PROGRESS NOTE  Becky [name]  KDX:[id number] DOB: 1960-11-20 DOA: 07/18/2020 PCP: Redmond School, MD   Brief Narrative: Becky [name] is a 60 y.o. female with a history of diverticulosis, HTN, HLD, obesity, PAF on xarelto, OSA who presented to the ED with nearly 2 weeks of increasing rectal/GI bleeding. She stopped taking xarelto 5 days ago. Last EGD 05/22/2020 w/esophageal dilatation, no gastritis or PUD. Last colonoscopy 2020 w/left-sided diverticulosis. She was hemodynamically stable with Hgb slightly lower than previous at 11.3g/dl. +FOBT. Diverticulosis without diverticulitis on CT. GI planning colonoscopy on 7/14. Has completed prep, no bleeding noted. Hgb now down to 10.8g/dl.   Assessment & Plan: Active Problems:   GI bleeding   Anemia  GI bleeding: Most consistent with lower source by history, BUN only 15, hx diverticulosis. - Will follow recommendations from GI following colonoscopy. Pt is NPO having completed prep.   Acute on chronic blood loss anemia: - Hgb has trended downward to 10.8g/dl. Will plan to recheck in AM to confirm this does not continue declining, as this is actually lower than her hgb has ever been.    Paroxysmal atrial fibrillation: CHA2DS2-VASc score is 2 (HTN, sex). Symptomatic during episodes. Pt of Dr. Harl Bowie. - Continue metoprolol and diltiazem (hold for bradycardia) - Hold xarelto. Will not bridge with heparin with known bleeding and relatively low risk of stroke. I recommend the patient discuss options with cardiology going forward. Perhaps eliquis would present lower risk of recurrent GI bleeding.   Rhinorrhea, congestion: Has completed abx for presumed sinus infection. - IN atrovent and prn flonase   OSA: - Continue CPAP qHS, followed by [facility].   HTN: - Continue ARB, metoprolol, diltiazem, spironolactone.   HLD: - Continue statin  Obesity: Estimated body mass index is 40.6 kg/m as calculated from the following:   Height as of this  encounter: 5\' 7"  (1.702 m).   Weight as of this encounter: 117.6 kg.  DVT prophylaxis: SCDs Code Status: Full Family Communication: Daughter at bedside Disposition Plan:  Status is: Observation. Will likely remain in house for repeat CBC in AM and monitoring for ongoing bleeding. Also depends on colonoscopy findings.   Dispo: The patient is from: Home              Anticipated d/c is to: Home              Patient currently is not medically stable to d/c.   Difficult to place patient No  Consultants:  GI  Procedures:  Colonoscopy Dr. Gala Romney pending 7/14.  Antimicrobials: None   Subjective: Denies any bleeding, last BMs have been normal. No chest pain or dyspnea.   Objective: Vitals:   07/18/20 1515 07/18/20 1601 07/18/20 2000 07/19/20 0528  BP:  (!) 121/50 (!) 113/55 (!) 102/55  Pulse: 91 (!) 58 63 70  Resp: 16 20 20 16   Temp:  98.9 F (37.2 C) 98.2 F (36.8 C) 98.2 F (36.8 C)  TempSrc:  Oral Oral Oral  SpO2:  100% 97% 95%  Weight:  117.6 kg    Height:  5\' 7"  (1.702 m)      Intake/Output Summary (Last 24 hours) at 07/19/2020 1224 Last data filed at 07/18/2020 1900 Gross per 24 hour  Intake 1080 ml  Output --  Net 1080 ml   Filed Weights   07/18/20 0926 07/18/20 1601  Weight: 120.2 kg 117.6 kg   Gen: No distress Pulm: Clear and nonlabored on room air  CV: RRR, no murmur, no JVD, no edema  GI: Soft, NT, ND, +BS Neuro: Alert and oriented. No focal deficits. Skin: No rashes, lesions or ulcers  Data Reviewed: I have personally reviewed following labs and imaging studies  CBC: Recent Labs  Lab 07/18/20 0952 07/18/20 1722 07/19/20 0439  WBC 6.9  --  5.9  NEUTROABS 3.6  --   --   HGB 11.3* 11.6* 10.8*  HCT 34.2* 35.4* 32.4*  MCV 95.3  --  95.3  PLT 317  --  778   Basic Metabolic Panel: Recent Labs  Lab 07/18/20 0956 07/19/20 0439  NA 136 139  K 3.8 3.6  CL 102 106  CO2 25 27  GLUCOSE 126* 106*  BUN 15 11  CREATININE 1.03* 0.93  CALCIUM 10.1  9.1   GFR: Estimated Creatinine Clearance: 86.4 mL/min (by C-G formula based on SCr of 0.93 mg/dL). Liver Function Tests: Recent Labs  Lab 07/18/20 0956  AST 17  ALT 21  ALKPHOS 61  BILITOT 0.8  PROT 6.6  ALBUMIN 3.8   No results for input(s): LIPASE, AMYLASE in the last 168 hours. No results for input(s): AMMONIA in the last 168 hours. Coagulation Profile: Recent Labs  Lab 07/18/20 1722  INR 1.0   Cardiac Enzymes: No results for input(s): CKTOTAL, CKMB, CKMBINDEX, TROPONINI in the last 168 hours. BNP (last 3 results) No results for input(s): PROBNP in the last 8760 hours. HbA1C: No results for input(s): HGBA1C in the last 72 hours. CBG: No results for input(s): GLUCAP in the last 168 hours. Lipid Profile: No results for input(s): CHOL, HDL, LDLCALC, TRIG, CHOLHDL, LDLDIRECT in the last 72 hours. Thyroid Function Tests: No results for input(s): TSH, T4TOTAL, FREET4, T3FREE, THYROIDAB in the last 72 hours. Anemia Panel: No results for input(s): VITAMINB12, FOLATE, FERRITIN, TIBC, IRON, RETICCTPCT in the last 72 hours. Urine analysis:    Component Value Date/Time   COLORURINE YELLOW 07/30/2018 1038   APPEARANCEUR CLEAR 07/30/2018 1038   APPEARANCEUR Clear 01/01/2017 1400   LABSPEC 1.015 07/30/2018 1038   PHURINE 5.0 07/30/2018 1038   GLUCOSEU NEGATIVE 07/30/2018 1038   HGBUR LARGE (A) 07/30/2018 1038   BILIRUBINUR NEGATIVE 07/30/2018 1038   BILIRUBINUR Negative 01/01/2017 1400   KETONESUR NEGATIVE 07/30/2018 1038   PROTEINUR NEGATIVE 07/30/2018 1038   UROBILINOGEN 0.2 01/16/2018 1308   NITRITE NEGATIVE 07/30/2018 1038   LEUKOCYTESUR NEGATIVE 07/30/2018 1038   Recent Results (from the past 240 hour(s))  Resp Panel by RT-PCR (Flu A&B, Covid) Nasopharyngeal Swab     Status: None   Collection Time: 07/18/20  1:39 PM   Specimen: Nasopharyngeal Swab; Nasopharyngeal(NP) swabs in vial transport medium  Result Value Ref Range Status   SARS Coronavirus 2 by RT PCR  NEGATIVE NEGATIVE Final    Comment: (NOTE) SARS-CoV-2 target nucleic acids are NOT DETECTED.  The SARS-CoV-2 RNA is generally detectable in upper respiratory specimens during the acute phase of infection. The lowest concentration of SARS-CoV-2 viral copies this assay can detect is 138 copies/mL. A negative result does not preclude SARS-Cov-2 infection and should not be used as the sole basis for treatment or other patient management decisions. A negative result may occur with  improper specimen collection/handling, submission of specimen other than nasopharyngeal swab, presence of viral mutation(s) within the areas targeted by this assay, and inadequate number of viral copies(<138 copies/mL). A negative result must be combined with clinical observations, patient history, and epidemiological information. The expected result is Negative.  Fact Sheet for Patients:  EntrepreneurPulse.com.au  Fact Sheet for Healthcare Providers:  IncredibleEmployment.be  This test is no t yet approved or cleared by the [place] and  has been authorized for detection and/or diagnosis of SARS-CoV-2 by FDA under an Emergency Use Authorization (EUA). This EUA will remain  in effect (meaning this test can be used) for the duration of the COVID-19 declaration under Section 564(b)(1) of the Act, 21 U.S.C.section 360bbb-3(b)(1), unless the authorization is terminated  or revoked sooner.       Influenza A by PCR NEGATIVE NEGATIVE Final   Influenza B by PCR NEGATIVE NEGATIVE Final    Comment: (NOTE) The Xpert Xpress SARS-CoV-2/FLU/RSV plus assay is intended as an aid in the diagnosis of influenza from Nasopharyngeal swab specimens and should not be used as a sole basis for treatment. Nasal washings and aspirates are unacceptable for Xpert Xpress SARS-CoV-2/FLU/RSV testing.  Fact Sheet for Patients: EntrepreneurPulse.com.au  Fact Sheet for  Healthcare Providers: IncredibleEmployment.be  This test is not yet approved or cleared by the [place] FDA and has been authorized for detection and/or diagnosis of SARS-CoV-2 by FDA under an Emergency Use Authorization (EUA). This EUA will remain in effect (meaning this test can be used) for the duration of the COVID-19 declaration under Section 564(b)(1) of the Act, 21 U.S.C. section 360bbb-3(b)(1), unless the authorization is terminated or revoked.  Performed at [name], [address], [place], [place] [address]       Radiology Studies: CT ABDOMEN PELVIS W CONTRAST  Result Date: 07/18/2020 CLINICAL DATA:  GI bleed. Concern for diverticulitis. Abdominal pain EXAM: CT ABDOMEN AND PELVIS WITH CONTRAST TECHNIQUE: Multidetector CT imaging of the abdomen and pelvis was performed using the standard protocol following bolus administration of intravenous contrast. CONTRAST:  19mL OMNIPAQUE IOHEXOL 300 MG/ML  SOLN COMPARISON:  01/14/2020 FINDINGS: Lower chest: Lung bases are clear. No effusions. Heart is normal size. Hepatobiliary: Diffuse low-density throughout the liver compatible with fatty infiltration. No focal abnormality. Gallbladder unremarkable. Pancreas: No focal abnormality or ductal dilatation. Spleen: No focal abnormality.  Normal size. Adrenals/Urinary Tract: Small left adrenal nodule measures 16 mm and is stable since prior study. No stones or hydronephrosis. Right adrenal gland and urinary bladder unremarkable. Stomach/Bowel: Left colonic diverticulosis. No changes of active diverticulitis. Stomach and small bowel decompressed. Normal appendix. Vascular/Lymphatic: Aortoiliac atherosclerosis. No evidence of aneurysm or adenopathy. Reproductive: Uterus and adnexa unremarkable.  No mass. Other: No free fluid or free air. Musculoskeletal: No acute bony abnormality. IMPRESSION: Left colonic diverticulosis.  No active diverticulitis. Hepatic steatosis. Stable left  adrenal nodule, nonspecific but most likely adenoma. Aortoiliac atherosclerosis. Electronically Signed   By: Rolm Baptise M.D.   On: 07/18/2020 11:13    Scheduled Meds:  diltiazem  360 mg Oral Daily   ipratropium  1 spray Each Nare BID   irbesartan  300 mg Oral Daily   metoprolol tartrate  25 mg Oral BID   montelukast  10 mg Oral QHS   pantoprazole  40 mg Oral Daily   rosuvastatin  5 mg Oral Daily   sodium chloride flush  3 mL Intravenous Q12H   spironolactone  25 mg Oral Daily   Continuous Infusions:   LOS: 0 days   Time spent: 25 minutes.  Patrecia Pour, MD [facility] [email or web address] 07/19/2020, 12:24 PM

## 2020-07-19 NOTE — Anesthesia Preprocedure Evaluation (Signed)
Anesthesia Evaluation  Patient identified by MRN, date of birth, ID band Patient awake    Reviewed: Allergy & Precautions, H&P , NPO status , Patient's Chart, lab work & pertinent test results, reviewed documented beta blocker date and time   Airway Mallampati: II  TM Distance: >3 FB Neck ROM: full    Dental no notable dental hx.    Pulmonary shortness of breath, asthma , sleep apnea , former smoker,    Pulmonary exam normal breath sounds clear to auscultation       Cardiovascular Exercise Tolerance: Good hypertension, + CAD  + dysrhythmias Atrial Fibrillation  Rhythm:irregular Rate:Normal     Neuro/Psych  Headaches, negative psych ROS   GI/Hepatic Neg liver ROS, PUD, GERD  Medicated,  Endo/Other  diabetes, Well Controlled, Type 2Hypothyroidism Morbid obesity  Renal/GU negative Renal ROS  negative genitourinary   Musculoskeletal   Abdominal   Peds  Hematology  (+) Blood dyscrasia, anemia ,   Anesthesia Other Findings ? Prox RCA to Mid RCA lesion is 25% stenosed. ? Prox LAD to Mid LAD lesion is 25% stenosed. ? The left ventricular systolic function is normal. ? The left ventricular ejection fraction is 55-65% by visual estimate. ? LV end diastolic pressure is normal. LVEDP 15 mm Hg. ? There is no aortic valve stenosis.   Continue medical therapy.     Reproductive/Obstetrics negative OB ROS                             Anesthesia Physical  Anesthesia Plan  ASA: 3  Anesthesia Plan: General   Post-op Pain Management:    Induction:   PONV Risk Score and Plan: Propofol infusion  Airway Management Planned:   Additional Equipment:   Intra-op Plan:   Post-operative Plan:   Informed Consent: I have reviewed the patients History and Physical, chart, labs and discussed the procedure including the risks, benefits and alternatives for the proposed anesthesia with the patient or  authorized representative who has indicated his/her understanding and acceptance.     Dental Advisory Given  Plan Discussed with: CRNA  Anesthesia Plan Comments:         Anesthesia Quick Evaluation

## 2020-07-19 NOTE — Progress Notes (Signed)
Pt has discharge orders, discharge teaching given and IV removed. No further questions from patient at this time.

## 2020-07-20 ENCOUNTER — Telehealth: Payer: Self-pay | Admitting: Internal Medicine

## 2020-07-20 ENCOUNTER — Other Ambulatory Visit: Payer: Self-pay

## 2020-07-20 DIAGNOSIS — K625 Hemorrhage of anus and rectum: Secondary | ICD-10-CM

## 2020-07-20 DIAGNOSIS — K219 Gastro-esophageal reflux disease without esophagitis: Secondary | ICD-10-CM

## 2020-07-20 DIAGNOSIS — D649 Anemia, unspecified: Secondary | ICD-10-CM

## 2020-07-20 NOTE — Telephone Encounter (Signed)
NOTED   Lab forms faxed to [name]) and pt called and made aware to go have this bloodwork done. Pt will go either Monday or Tuesday.

## 2020-07-20 NOTE — Telephone Encounter (Signed)
Patient admitted recently went home yesterday with some hematochezia.  Stable hemoglobin 3 polyps small removed from distal colon.  Trivial.  Was to start back on Eliquis today.  She called me last night states she saw some more blood.  May just be from polyp removal.  Please call her this morning and see how she is doing if she is doing okay and has not had any more bleeding resume Eliquis today.  Since she has seen  a little more blood just wait and start Eliquis back Monday of next week.  Patient is to watch for bleeding closely.  Please call her this morning and let her know.

## 2020-07-20 NOTE — Telephone Encounter (Signed)
See telephone note.

## 2020-07-20 NOTE — Telephone Encounter (Signed)
Spoke with the pt this morning before I seen the note: Dr. Gala [name] pt advises she is not fatigued or weak but there is still some bleeding. I asked her how much and she advised about the same amount as before the colonoscopy. I asked her was she in pain and she advised no. She only had a sharp pain once after colonoscopy down her rectum she stated. She was able to have a bowel movement yesterday about 7:30-8pm. She states she hasn't ate much but she was trying to eat this morning. I did advise her of your instructions and not to take the Eliquis today to wait until Monday if she is bleeding.   2. [name], the pt is very concerned about her FMLA paperwork for her and her daughter (because the daughter is helping to take care of her mother). I advised you were out of the office today but you are aware of the paperwork. I advised the pt that once the paperwork is completed we will call them. We are aware that she is dealing with 2 different situations and your jobs will need this to be for more than 3 days. The pt didn't specify how long the paperwork needed to cover. I was going to let you and her speak on that.

## 2020-07-23 ENCOUNTER — Other Ambulatory Visit (HOSPITAL_COMMUNITY)
Admission: RE | Admit: 2020-07-23 | Discharge: 2020-07-23 | Disposition: A | Discharge status: Home / Self Care | Payer: BC Managed Care – PPO | Source: Ambulatory Visit | Attending: Internal Medicine | Admitting: Internal Medicine

## 2020-07-23 ENCOUNTER — Encounter: Payer: Self-pay | Admitting: Internal Medicine

## 2020-07-23 ENCOUNTER — Other Ambulatory Visit: Payer: Self-pay

## 2020-07-23 ENCOUNTER — Telehealth: Payer: Self-pay | Admitting: Internal Medicine

## 2020-07-23 ENCOUNTER — Other Ambulatory Visit: Payer: BC Managed Care – PPO | Admitting: Adult Health

## 2020-07-23 ENCOUNTER — Encounter (HOSPITAL_COMMUNITY): Payer: Self-pay | Admitting: Physical Therapy

## 2020-07-23 ENCOUNTER — Ambulatory Visit (HOSPITAL_COMMUNITY): Payer: BC Managed Care – PPO | Admitting: Physical Therapy

## 2020-07-23 DIAGNOSIS — R2689 Other abnormalities of gait and mobility: Secondary | ICD-10-CM

## 2020-07-23 DIAGNOSIS — K922 Gastrointestinal hemorrhage, unspecified: Secondary | ICD-10-CM | POA: Diagnosis not present

## 2020-07-23 DIAGNOSIS — D649 Anemia, unspecified: Secondary | ICD-10-CM | POA: Insufficient documentation

## 2020-07-23 DIAGNOSIS — R29898 Other symptoms and signs involving the musculoskeletal system: Secondary | ICD-10-CM | POA: Diagnosis not present

## 2020-07-23 DIAGNOSIS — M25572 Pain in left ankle and joints of left foot: Secondary | ICD-10-CM

## 2020-07-23 DIAGNOSIS — Z6839 Body mass index (BMI) 39.0-39.9, adult: Secondary | ICD-10-CM | POA: Diagnosis not present

## 2020-07-23 DIAGNOSIS — M6281 Muscle weakness (generalized): Secondary | ICD-10-CM

## 2020-07-23 DIAGNOSIS — K649 Unspecified hemorrhoids: Secondary | ICD-10-CM | POA: Diagnosis not present

## 2020-07-23 DIAGNOSIS — K625 Hemorrhage of anus and rectum: Secondary | ICD-10-CM | POA: Diagnosis not present

## 2020-07-23 LAB — HEMOGLOBIN AND HEMATOCRIT, BLOOD
HCT: 35.4 % — ABNORMAL LOW (ref 36.0–46.0)
Hemoglobin: 11.4 g/dL — ABNORMAL LOW (ref 12.0–15.0)

## 2020-07-23 NOTE — Telephone Encounter (Signed)
Pt's daughter called office Friday asking about FMLA papers for her and her mother which is the patient. I told her that [place] wasn't here and I wasn't sure if papers were done. I transferred call to [facility]. Today both patient and daughter came to the front desk asking about the FMLA papers so she could return to work and again I told them that Leonia Reeves works between offices and would be in later today. Then the patient was asking about her husband getting a work note because he was with her the day of procedure. I told her that I would ask [name] what the status is and someone would be calling her. [phone]
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
## 2020-07-24 ENCOUNTER — Encounter: Payer: Self-pay | Admitting: Internal Medicine

## 2020-07-24 ENCOUNTER — Other Ambulatory Visit: Payer: Self-pay | Admitting: *Deleted

## 2020-07-24 DIAGNOSIS — K625 Hemorrhage of anus and rectum: Secondary | ICD-10-CM

## 2020-07-24 LAB — SURGICAL PATHOLOGY

## 2020-07-24 NOTE — Telephone Encounter (Signed)
This was done.

## 2020-07-27 ENCOUNTER — Ambulatory Visit (INDEPENDENT_AMBULATORY_CARE_PROVIDER_SITE_OTHER): Payer: BC Managed Care – PPO | Admitting: Cardiology

## 2020-07-27 ENCOUNTER — Other Ambulatory Visit: Payer: Self-pay

## 2020-07-27 ENCOUNTER — Encounter (HOSPITAL_COMMUNITY): Payer: Self-pay | Admitting: Internal Medicine

## 2020-07-27 ENCOUNTER — Telehealth: Payer: Self-pay | Admitting: Cardiology

## 2020-07-27 VITALS — BP 140/98 | HR 70 | Ht 67.0 in | Wt 264.0 lb

## 2020-07-27 DIAGNOSIS — R600 Localized edema: Secondary | ICD-10-CM

## 2020-07-27 DIAGNOSIS — I480 Paroxysmal atrial fibrillation: Secondary | ICD-10-CM

## 2020-07-27 DIAGNOSIS — Z79899 Other long term (current) drug therapy: Secondary | ICD-10-CM

## 2020-07-27 NOTE — Patient Instructions (Signed)
Medication Instructions:  Your physician recommends that you continue on your current medications as directed. Please refer to the Current Medication list given to you today.  *If you need a refill on your cardiac medications before your next appointment, please call your pharmacy*   Lab Work: Your physician recommends that you return for lab work in: 2 [place] ( 08/10/20)   If you have labs (blood work) drawn today and your tests are completely normal, you will receive your results only by: [place] (if you have [place]) OR A paper copy in the mail If you have any lab test that is abnormal or we need to change your treatment, we will call you to review the results.   Testing/Procedures: NONE    Follow-Up: At [facility], you and your health needs are our priority.  As part of our continuing mission to provide you with exceptional heart care, we have created designated Provider Care Teams.  These Care Teams include your primary Cardiologist (physician) and Advanced Practice Providers (APPs -  Physician Assistants and Nurse Practitioners) who all work together to provide you with the care you need, when you need it.  We recommend signing up for the patient portal called "MyChart".  Sign up information is provided on this After Visit Summary.  MyChart is used to connect with patients for Virtual Visits (Telemedicine).  Patients are able to view lab/test results, encounter notes, upcoming appointments, etc.  Non-urgent messages can be sent to your provider as well.   To learn more about what you can do with MyChart, go to [email or web address].    Your next appointment:   6 month(s)  The format for your next appointment:   In Person  Provider:   Carlyle Dolly, MD   Other Instructions Thank you for choosing [facility]!

## 2020-07-27 NOTE — Telephone Encounter (Signed)
Pt has in office visit with Dr. Harl Bowie today.

## 2020-07-27 NOTE — Telephone Encounter (Signed)
New message     Pt c/o medication issue:  1. Name of Medication: xarelto  2. How are you currently taking this medication (dosage and times per day)? Hasnt taken in 14 days  3. Are you having a reaction (difficulty breathing--STAT)? no  4. What is your medication issue? Patient is having a GI bleed and they stopped her Xarelto and she is still having bleeding
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
## 2020-07-31 ENCOUNTER — Telehealth (HOSPITAL_COMMUNITY): Payer: Self-pay

## 2020-07-31 ENCOUNTER — Ambulatory Visit (HOSPITAL_COMMUNITY): Payer: BC Managed Care – PPO

## 2020-07-31 NOTE — Telephone Encounter (Signed)
No show, called and left message concerning missed apt today.  Reminded next apt date and time with contact number included if needs to cancel/reschedule future apts.    Ihor Austin, LPTA/CLT; Delana Meyer [phone]

## 2020-08-01 ENCOUNTER — Ambulatory Visit (INDEPENDENT_AMBULATORY_CARE_PROVIDER_SITE_OTHER): Payer: BC Managed Care – PPO | Admitting: Gastroenterology

## 2020-08-01 ENCOUNTER — Telehealth: Payer: Self-pay | Admitting: Gastroenterology

## 2020-08-01 ENCOUNTER — Encounter (HOSPITAL_COMMUNITY): Payer: Self-pay | Admitting: Physical Therapy

## 2020-08-01 ENCOUNTER — Encounter: Payer: Self-pay | Admitting: Gastroenterology

## 2020-08-01 ENCOUNTER — Other Ambulatory Visit: Payer: Self-pay

## 2020-08-01 VITALS — BP 102/79 | HR 67 | Temp 97.7°F | Ht 67.0 in | Wt 264.6 lb

## 2020-08-01 DIAGNOSIS — K219 Gastro-esophageal reflux disease without esophagitis: Secondary | ICD-10-CM | POA: Diagnosis not present

## 2020-08-01 DIAGNOSIS — K625 Hemorrhage of anus and rectum: Secondary | ICD-10-CM | POA: Diagnosis not present

## 2020-08-01 DIAGNOSIS — K649 Unspecified hemorrhoids: Secondary | ICD-10-CM | POA: Insufficient documentation

## 2020-08-01 NOTE — Telephone Encounter (Signed)
I called [facility] and they are going to fax her labs over.

## 2020-08-01 NOTE — Patient Instructions (Addendum)
You may resume Xarelto.  We will arrange for you to have a follow-up with [name] for any hemorrhoid banding.   If you have recurrent rectal bleeding, please let me know.   If your rectal bleeding returns, resume hydrocortisone suppositories twice a day for 7 to 10 days.  Continue Linzess 72 mcg daily 30 minutes before your first meal.  Increase Benefiber to twice daily.  Limit toilet time to 2-3 minutes.  Avoid straining.  Continue Dexilant 60 mg daily.  Aliene Altes, PA-C [facility]

## 2020-08-01 NOTE — Telephone Encounter (Signed)
Becky [name], can we get labs from [facility] ASAP.  Patient states she completed repeat labs on 7/25.

## 2020-08-02 ENCOUNTER — Ambulatory Visit (INDEPENDENT_AMBULATORY_CARE_PROVIDER_SITE_OTHER): Payer: BC Managed Care – PPO | Admitting: Orthopaedic Surgery

## 2020-08-02 ENCOUNTER — Encounter: Payer: Self-pay | Admitting: Orthopaedic Surgery

## 2020-08-02 DIAGNOSIS — M1712 Unilateral primary osteoarthritis, left knee: Secondary | ICD-10-CM | POA: Diagnosis not present

## 2020-08-02 DIAGNOSIS — M7062 Trochanteric bursitis, left hip: Secondary | ICD-10-CM
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
## 2020-08-07 ENCOUNTER — Ambulatory Visit (HOSPITAL_COMMUNITY): Payer: BC Managed Care – PPO | Attending: Orthopedic Surgery | Admitting: Physical Therapy

## 2020-08-07 ENCOUNTER — Other Ambulatory Visit: Payer: Self-pay

## 2020-08-07 ENCOUNTER — Encounter (HOSPITAL_COMMUNITY): Payer: Self-pay | Admitting: Physical Therapy

## 2020-08-07 DIAGNOSIS — R2689 Other abnormalities of gait and mobility: Secondary | ICD-10-CM | POA: Diagnosis not present

## 2020-08-07 DIAGNOSIS — R29898 Other symptoms and signs involving the musculoskeletal system: Secondary | ICD-10-CM | POA: Insufficient documentation

## 2020-08-07 DIAGNOSIS — M6281 Muscle weakness (generalized): Secondary | ICD-10-CM | POA: Diagnosis not present

## 2020-08-07 DIAGNOSIS — M25572 Pain in left ankle and joints of left foot: Secondary | ICD-10-CM | POA: Insufficient documentation
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
## 2020-08-08 DIAGNOSIS — M76822 Posterior tibial tendinitis, left leg: Secondary | ICD-10-CM | POA: Diagnosis not present

## 2020-08-08 NOTE — Telephone Encounter (Signed)
Fantastic!  ?

## 2020-08-08 NOTE — Telephone Encounter (Signed)
Becky [name], I have not received any labs for this patient. Can we request again?

## 2020-08-08 NOTE — Telephone Encounter (Signed)
I called [facility] again and she said they were being faxed now.

## 2020-08-08 NOTE — Telephone Encounter (Signed)
Spoke to pt. Informed her of results and recommendations. Pt voiced understanding. Asked pt about any rectal bleeding. She voiced no not at this time.

## 2020-08-08 NOTE — Telephone Encounter (Signed)
Received and reviewed blood work completed 07/30/2020 with PCP. CBC: WBC 8.7, hemoglobin 11.2, hematocrit 34.5, MCV 93, MCH 30.2, MCHC 32.5, platelets 369.  Dena: Please let patient know I have received and reviewed recent blood work completed with PCP on 7/25.  Her hemoglobin was fairly stable at 11.2, previously 11.4 on 7/18.  We will continue with our current plan for hemorrhoid banding in the near future with [name], NP.  She should let us know if she has any recurrent rectal bleeding in the meantime.

## 2020-08-09 ENCOUNTER — Encounter (HOSPITAL_COMMUNITY): Payer: Self-pay

## 2020-08-09 ENCOUNTER — Other Ambulatory Visit: Payer: Self-pay

## 2020-08-09 ENCOUNTER — Ambulatory Visit (HOSPITAL_COMMUNITY): Payer: BC Managed Care – PPO

## 2020-08-09 DIAGNOSIS — R2689 Other abnormalities of gait and mobility: Secondary | ICD-10-CM

## 2020-08-09 DIAGNOSIS — M25572 Pain in left ankle and joints of left foot: Secondary | ICD-10-CM | POA: Diagnosis not present

## 2020-08-09 DIAGNOSIS — R29898 Other symptoms and signs involving the musculoskeletal system: Secondary | ICD-10-CM | POA: Diagnosis not present

## 2020-08-09 DIAGNOSIS — M6281 Muscle weakness (generalized): Secondary | ICD-10-CM
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
## 2020-08-13 ENCOUNTER — Ambulatory Visit: Payer: BC Managed Care – PPO | Admitting: Family Medicine

## 2020-08-13 ENCOUNTER — Encounter: Payer: Self-pay | Admitting: Family Medicine

## 2020-08-13 DIAGNOSIS — G4733 Obstructive sleep apnea (adult) (pediatric): Secondary | ICD-10-CM

## 2020-08-14 ENCOUNTER — Ambulatory Visit (HOSPITAL_COMMUNITY): Payer: BC Managed Care – PPO

## 2020-08-14 ENCOUNTER — Other Ambulatory Visit: Payer: Self-pay

## 2020-08-14 ENCOUNTER — Encounter (HOSPITAL_COMMUNITY): Payer: Self-pay

## 2020-08-14 DIAGNOSIS — M6281 Muscle weakness (generalized): Secondary | ICD-10-CM

## 2020-08-14 DIAGNOSIS — R29898 Other symptoms and signs involving the musculoskeletal system: Secondary | ICD-10-CM

## 2020-08-14 DIAGNOSIS — R2689 Other abnormalities of gait and mobility: Secondary | ICD-10-CM

## 2020-08-14 DIAGNOSIS — M25572 Pain in left ankle and joints of left foot: Secondary | ICD-10-CM | POA: Diagnosis not present
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
## 2020-08-16 ENCOUNTER — Ambulatory Visit (HOSPITAL_COMMUNITY): Payer: BC Managed Care – PPO | Admitting: Physical Therapy

## 2020-08-16 ENCOUNTER — Other Ambulatory Visit: Payer: Self-pay | Admitting: Family Medicine

## 2020-08-16 ENCOUNTER — Telehealth (HOSPITAL_COMMUNITY): Payer: Self-pay | Admitting: Physical Therapy

## 2020-08-16 NOTE — Telephone Encounter (Signed)
Called patient about missed appointment today and left VM reminder of upcoming visit on 8/16.   11:45 AM, 08/16/20 Josue Hector PT DPT  Physical Therapist with [facility]  [phone]

## 2020-08-21 ENCOUNTER — Ambulatory Visit (HOSPITAL_COMMUNITY): Payer: BC Managed Care – PPO | Admitting: Physical Therapy

## 2020-08-21 ENCOUNTER — Other Ambulatory Visit: Payer: Self-pay

## 2020-08-21 ENCOUNTER — Encounter (HOSPITAL_COMMUNITY): Payer: Self-pay | Admitting: Physical Therapy

## 2020-08-21 DIAGNOSIS — R29898 Other symptoms and signs involving the musculoskeletal system: Secondary | ICD-10-CM

## 2020-08-21 DIAGNOSIS — M25572 Pain in left ankle and joints of left foot: Secondary | ICD-10-CM | POA: Diagnosis not present

## 2020-08-21 DIAGNOSIS — R2689 Other abnormalities of gait and mobility: Secondary | ICD-10-CM | POA: Diagnosis not present

## 2020-08-21 DIAGNOSIS — M6281 Muscle weakness (generalized): Secondary | ICD-10-CM
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
## 2020-08-23 ENCOUNTER — Ambulatory Visit (HOSPITAL_COMMUNITY): Payer: BC Managed Care – PPO | Admitting: Physical Therapy

## 2020-08-23 ENCOUNTER — Other Ambulatory Visit: Payer: Self-pay

## 2020-08-23 ENCOUNTER — Encounter (HOSPITAL_COMMUNITY): Payer: Self-pay | Admitting: Physical Therapy

## 2020-08-23 DIAGNOSIS — R2689 Other abnormalities of gait and mobility: Secondary | ICD-10-CM

## 2020-08-23 DIAGNOSIS — M6281 Muscle weakness (generalized): Secondary | ICD-10-CM

## 2020-08-23 DIAGNOSIS — R29898 Other symptoms and signs involving the musculoskeletal system: Secondary | ICD-10-CM

## 2020-08-23 DIAGNOSIS — M25572 Pain in left ankle and joints of left foot: Secondary | ICD-10-CM
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
## 2020-08-23 NOTE — Patient Instructions (Signed)
Access Code: [email or web address] URL: [email or web address] Date: 08/23/2020 Prepared by: Mitzi Hansen Cheick Suhr  Exercises Standing Hip Abduction with Resistance at Ankles and Counter Support - 1 x daily - 7 x weekly - 3 sets - 10 reps

## 2020-08-28 ENCOUNTER — Other Ambulatory Visit: Payer: BC Managed Care – PPO | Admitting: Adult Health

## 2020-08-28 DIAGNOSIS — G4733 Obstructive sleep apnea (adult) (pediatric): Secondary | ICD-10-CM | POA: Diagnosis not present

## 2020-08-30 ENCOUNTER — Encounter: Payer: Self-pay | Admitting: Orthopaedic Surgery

## 2020-08-30 ENCOUNTER — Ambulatory Visit (INDEPENDENT_AMBULATORY_CARE_PROVIDER_SITE_OTHER): Payer: BC Managed Care – PPO | Admitting: Orthopaedic Surgery

## 2020-08-30 ENCOUNTER — Other Ambulatory Visit: Payer: Self-pay

## 2020-08-30 VITALS — Ht 67.0 in | Wt 264.0 lb

## 2020-08-30 DIAGNOSIS — M1712 Unilateral primary osteoarthritis, left knee: Secondary | ICD-10-CM | POA: Diagnosis not present

## 2020-08-30 DIAGNOSIS — M7062 Trochanteric bursitis, left hip: Secondary | ICD-10-CM
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
## 2020-08-31 ENCOUNTER — Telehealth (HOSPITAL_COMMUNITY): Payer: Self-pay | Admitting: Physical Therapy

## 2020-08-31 ENCOUNTER — Encounter (HOSPITAL_COMMUNITY): Payer: Self-pay | Admitting: Physical Therapy

## 2020-08-31 NOTE — Telephone Encounter (Signed)
No Show. Called patient and patient reported she cancelled apt yesterday. Confirmed next apt with patient.   8:14 AM, 08/31/20 Jerene Pitch, DPT Physical Therapy with [facility]  [phone] office

## 2020-09-03 ENCOUNTER — Other Ambulatory Visit: Payer: Self-pay

## 2020-09-03 ENCOUNTER — Ambulatory Visit (HOSPITAL_COMMUNITY): Payer: BC Managed Care – PPO | Admitting: Physical Therapy

## 2020-09-03 ENCOUNTER — Encounter (HOSPITAL_COMMUNITY): Payer: Self-pay | Admitting: Physical Therapy

## 2020-09-03 DIAGNOSIS — R2689 Other abnormalities of gait and mobility: Secondary | ICD-10-CM

## 2020-09-03 DIAGNOSIS — M6281 Muscle weakness (generalized): Secondary | ICD-10-CM | POA: Diagnosis not present

## 2020-09-03 DIAGNOSIS — R29898 Other symptoms and signs involving the musculoskeletal system: Secondary | ICD-10-CM

## 2020-09-03 DIAGNOSIS — M25572 Pain in left ankle and joints of left foot: Secondary | ICD-10-CM | POA: Diagnosis not present
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
## 2020-09-05 ENCOUNTER — Telehealth: Payer: Self-pay | Admitting: Internal Medicine

## 2020-09-05 ENCOUNTER — Telehealth: Payer: Self-pay

## 2020-09-05 ENCOUNTER — Telehealth: Payer: Self-pay | Admitting: Cardiology

## 2020-09-05 DIAGNOSIS — Z79899 Other long term (current) drug therapy: Secondary | ICD-10-CM

## 2020-09-05 NOTE — Telephone Encounter (Signed)
Lab changed to [place].

## 2020-09-05 NOTE — Telephone Encounter (Signed)
Pt phoned with complaint of bleeding hemorrhoids x 2 weeks. She has been using the suppositories (Dr. Armandina Gemma refilled for her). She does not strain when she has a bowel movement, but there is pressure (especially with taking Trulance--ran out of Linzes), states it may have been because she didn't/hasn't been eating a lot. The blood from hemorrhoids turns the toilet water red. The pt took herself off of her blood thinners now x 6 days. States the blood when having a bowel movement now is jelly like texture. She said she called up here a few days ago but she was offered appt but she had another appt and could not come. She was looking for a call back but never received it and I advised her I never got a message to call her. Please advise .

## 2020-09-05 NOTE — Telephone Encounter (Signed)
See other phone note

## 2020-09-05 NOTE — Telephone Encounter (Signed)
[phone] please call patient, she is having some rectal bleeding and is using a suppository that is not helping

## 2020-09-05 NOTE — Telephone Encounter (Signed)
I just spoke with patient.  Since his procedure he has had issues with neck pain which he describes as stinging with radiation down his arm.  Possible he has cervical radiculopathy.  No issues from a GI standpoint.  Appears that he was a patient of Dr. Anastasio Champion and has not established with new PCP yet.  Can we refer him to Ortho here in [place]?  If his pain worsens, I recommended that he seek care at urgent care and/or emergency room.  He understands.  Thank you

## 2020-09-05 NOTE — Telephone Encounter (Signed)
Please give pt a call concerning labs - she's needing to have them done somewhere else   [phone]

## 2020-09-06 ENCOUNTER — Ambulatory Visit (HOSPITAL_COMMUNITY): Payer: BC Managed Care – PPO | Attending: Orthopedic Surgery | Admitting: Physical Therapy

## 2020-09-06 ENCOUNTER — Encounter (HOSPITAL_COMMUNITY): Payer: Self-pay | Admitting: Physical Therapy

## 2020-09-06 ENCOUNTER — Other Ambulatory Visit: Payer: Self-pay

## 2020-09-06 DIAGNOSIS — M25572 Pain in left ankle and joints of left foot: Secondary | ICD-10-CM | POA: Diagnosis not present

## 2020-09-06 DIAGNOSIS — R2689 Other abnormalities of gait and mobility: Secondary | ICD-10-CM | POA: Diagnosis not present

## 2020-09-06 DIAGNOSIS — M6281 Muscle weakness (generalized): Secondary | ICD-10-CM | POA: Diagnosis not present

## 2020-09-06 DIAGNOSIS — R29898 Other symptoms and signs involving the musculoskeletal system: Secondary | ICD-10-CM | POA: Diagnosis not present
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
## 2020-09-06 NOTE — Telephone Encounter (Signed)
Please disregard previous message.  This was intended for a different patient.  Thank you

## 2020-09-06 NOTE — Telephone Encounter (Signed)
Noted  

## 2020-09-06 NOTE — Telephone Encounter (Signed)
noted 
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
## 2020-09-11 NOTE — Patient Instructions (Addendum)
Please continue using your CPAP regularly. While your insurance requires that you use CPAP at least 4 hours each night on 70% of the nights, I recommend, that you not skip any nights and use it throughout the night if you can. Getting used to CPAP and staying with the treatment long term does take time and patience and discipline. Untreated obstructive sleep apnea when it is moderate to severe can have an adverse impact on cardiovascular health and raise her risk for heart disease, arrhythmias, hypertension, congestive heart failure, stroke and diabetes. Untreated obstructive sleep apnea causes sleep disruption, nonrestorative sleep, and sleep deprivation. This can have an impact on your day to day functioning and cause daytime sleepiness and impairment of cognitive function, memory loss, mood disturbance, and problems focussing. Using CPAP regularly can improve these symptoms.  Continue working on improving CPAP compliance. Focus on using CPAP every night.   Follow up in 3 month

## 2020-09-12 ENCOUNTER — Ambulatory Visit (HOSPITAL_COMMUNITY): Payer: BC Managed Care – PPO | Admitting: Physical Therapy

## 2020-09-12 ENCOUNTER — Encounter: Payer: Self-pay | Admitting: Family Medicine

## 2020-09-12 ENCOUNTER — Other Ambulatory Visit: Payer: Self-pay

## 2020-09-12 ENCOUNTER — Ambulatory Visit (INDEPENDENT_AMBULATORY_CARE_PROVIDER_SITE_OTHER): Payer: BC Managed Care – PPO | Admitting: Family Medicine

## 2020-09-12 VITALS — BP 124/70 | HR 58 | Ht 67.0 in | Wt 260.0 lb

## 2020-09-12 DIAGNOSIS — G4733 Obstructive sleep apnea (adult) (pediatric): Secondary | ICD-10-CM | POA: Diagnosis not present

## 2020-09-12 DIAGNOSIS — Z9989 Dependence on other enabling machines and devices: Secondary | ICD-10-CM | POA: Diagnosis not present

## 2020-09-13 ENCOUNTER — Encounter (HOSPITAL_COMMUNITY): Payer: Self-pay | Admitting: Physical Therapy

## 2020-09-13 ENCOUNTER — Other Ambulatory Visit: Payer: Self-pay

## 2020-09-13 ENCOUNTER — Ambulatory Visit (HOSPITAL_COMMUNITY): Payer: BC Managed Care – PPO | Admitting: Physical Therapy

## 2020-09-13 DIAGNOSIS — M6281 Muscle weakness (generalized): Secondary | ICD-10-CM

## 2020-09-13 DIAGNOSIS — R2689 Other abnormalities of gait and mobility: Secondary | ICD-10-CM

## 2020-09-13 DIAGNOSIS — M25572 Pain in left ankle and joints of left foot: Secondary | ICD-10-CM

## 2020-09-13 DIAGNOSIS — R29898 Other symptoms and signs involving the musculoskeletal system: Secondary | ICD-10-CM | POA: Diagnosis not present

## 2020-09-13 NOTE — Patient Instructions (Signed)
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
## 2020-09-18 ENCOUNTER — Encounter (HOSPITAL_COMMUNITY): Payer: Self-pay | Admitting: Physical Therapy

## 2020-09-18 ENCOUNTER — Other Ambulatory Visit: Payer: Self-pay

## 2020-09-18 ENCOUNTER — Ambulatory Visit (HOSPITAL_COMMUNITY): Payer: BC Managed Care – PPO | Admitting: Physical Therapy

## 2020-09-18 DIAGNOSIS — M6281 Muscle weakness (generalized): Secondary | ICD-10-CM

## 2020-09-18 DIAGNOSIS — R2689 Other abnormalities of gait and mobility: Secondary | ICD-10-CM

## 2020-09-18 DIAGNOSIS — M25572 Pain in left ankle and joints of left foot: Secondary | ICD-10-CM | POA: Diagnosis not present

## 2020-09-18 DIAGNOSIS — R29898 Other symptoms and signs involving the musculoskeletal system: Secondary | ICD-10-CM
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
## 2020-09-20 ENCOUNTER — Encounter (HOSPITAL_COMMUNITY): Payer: BC Managed Care – PPO | Admitting: Physical Therapy

## 2020-09-22 ENCOUNTER — Other Ambulatory Visit: Payer: Self-pay | Admitting: Student

## 2020-10-01 ENCOUNTER — Other Ambulatory Visit: Payer: Self-pay

## 2020-10-01 ENCOUNTER — Emergency Department (HOSPITAL_COMMUNITY)
Admission: EM | Admit: 2020-10-01 | Discharge: 2020-10-01 | Discharge status: Against Medical Advice | Payer: BC Managed Care – PPO

## 2020-10-04 ENCOUNTER — Ambulatory Visit (INDEPENDENT_AMBULATORY_CARE_PROVIDER_SITE_OTHER): Payer: BC Managed Care – PPO | Admitting: Orthopaedic Surgery

## 2020-10-04 ENCOUNTER — Encounter: Payer: Self-pay | Admitting: Orthopaedic Surgery

## 2020-10-04 ENCOUNTER — Other Ambulatory Visit: Payer: Self-pay

## 2020-10-04 DIAGNOSIS — M25552 Pain in left hip: Secondary | ICD-10-CM | POA: Diagnosis not present

## 2020-10-04 DIAGNOSIS — M7061 Trochanteric bursitis, right hip: Secondary | ICD-10-CM | POA: Diagnosis not present

## 2020-10-04 MED ORDER — METHYLPREDNISOLONE ACETATE 40 MG/ML IJ SUSP
40.0000 mg | INTRAMUSCULAR | Status: AC | PRN
  Administered 2020-10-04: 40 mg via INTRA_ARTICULAR
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
## 2020-10-05 ENCOUNTER — Ambulatory Visit: Payer: BC Managed Care – PPO | Admitting: Gastroenterology

## 2020-10-09 ENCOUNTER — Encounter: Payer: BC Managed Care – PPO | Admitting: Gastroenterology

## 2020-10-10 NOTE — Progress Notes (Signed)
    [place] Banding Note:   Becky [name] is a 60 y.o. female presenting today for consideration of hemorrhoid banding. Last colonoscopy July 2022 while inpatient for GI bleeding with diverticulosis and adenomas. 5 year surveillance recommended. Felt bleeding most likely from hemorrhoids and less likely diverticular bleeding. She is on Xarelto for afib. We discussed at length risks of banding on anticoagulation; I discussed this with Dr. Abbey Chatters as well. Pursuing one banding today as she will continue to bleed with hemorrhoids in light of anticoagulation. Daughter present with her today. Notes intermittent bleeding and pressure. No pain. She desires to pursue banding today.    The patient presents with symptomatic grade 2 hemorrhoids, unresponsive to maximal medical therapy, requesting rubber band ligation of her hemorrhoidal disease. All risks, benefits, and alternative forms of therapy were described and informed consent was obtained.  The decision was made to band neutrally, as she did not tolerate any manipulation of the ligator due to anxiety.  The [place] was used to perform band ligation without complication. Digital anorectal examination was then performed to assure proper positioning of the band, and to adjust the banded tissue as required. Appeared to be in left lateral position. The patient was discharged home without pain or other issues. Dietary and behavioral recommendations were given and (if necessary prescriptions were given), along with follow-up instructions. The patient will return in followup and possible additional banding as required. If significant bleeding post-banding, we will hold Xarelto 24-48 hours then resume.   No complications were encountered and the patient tolerated the procedure well.   [name], PhD, ANP-BC [facility]

## 2020-10-11 ENCOUNTER — Other Ambulatory Visit: Payer: Self-pay

## 2020-10-11 ENCOUNTER — Ambulatory Visit (INDEPENDENT_AMBULATORY_CARE_PROVIDER_SITE_OTHER): Payer: BC Managed Care – PPO | Admitting: Gastroenterology

## 2020-10-11 ENCOUNTER — Encounter: Payer: Self-pay | Admitting: Gastroenterology

## 2020-10-11 ENCOUNTER — Encounter: Payer: Self-pay | Admitting: Internal Medicine

## 2020-10-11 VITALS — BP 140/91 | HR 71 | Temp 97.3°F | Ht 67.0 in | Wt 264.4 lb

## 2020-10-11 DIAGNOSIS — K641 Second degree hemorrhoids: Secondary | ICD-10-CM | POA: Diagnosis not present

## 2020-10-11 NOTE — Patient Instructions (Signed)
We will see you back for additional banding as needed.  You may have some mild cramping but should not have any pain. If you need to have a bowel movement, go ahead and try. Don't sit on toilet for more than 2-3 minutes at a time.  Please call if significant bleeding! The risk of bleeding is greatest at 1-2 weeks after banding.  It was a pleasure to see you today. I want to create trusting relationships with patients to provide genuine, compassionate, and quality care. I value your feedback. If you receive a survey regarding your visit,  I greatly appreciate you taking time to fill this out.   [name], PhD, ANP-BC [facility]

## 2020-10-15 ENCOUNTER — Other Ambulatory Visit: Payer: BC Managed Care – PPO | Admitting: Adult Health

## 2020-11-24 ENCOUNTER — Other Ambulatory Visit: Payer: Self-pay | Admitting: Adult Health

## 2020-12-03 ENCOUNTER — Emergency Department (HOSPITAL_COMMUNITY): Payer: BC Managed Care – PPO

## 2020-12-03 ENCOUNTER — Telehealth: Payer: Self-pay | Admitting: Internal Medicine

## 2020-12-03 ENCOUNTER — Emergency Department (HOSPITAL_COMMUNITY)
Admission: EM | Admit: 2020-12-03 | Discharge: 2020-12-03 | Disposition: A | Discharge status: Home / Self Care | Payer: BC Managed Care – PPO | Attending: Emergency Medicine | Admitting: Emergency Medicine

## 2020-12-03 ENCOUNTER — Encounter (HOSPITAL_COMMUNITY): Payer: Self-pay | Admitting: Emergency Medicine

## 2020-12-03 DIAGNOSIS — I2510 Atherosclerotic heart disease of native coronary artery without angina pectoris: Secondary | ICD-10-CM | POA: Diagnosis not present

## 2020-12-03 DIAGNOSIS — R002 Palpitations: Secondary | ICD-10-CM | POA: Insufficient documentation

## 2020-12-03 DIAGNOSIS — R0602 Shortness of breath: Secondary | ICD-10-CM | POA: Insufficient documentation

## 2020-12-03 DIAGNOSIS — Z7901 Long term (current) use of anticoagulants: Secondary | ICD-10-CM | POA: Insufficient documentation

## 2020-12-03 DIAGNOSIS — Z79899 Other long term (current) drug therapy: Secondary | ICD-10-CM | POA: Diagnosis not present

## 2020-12-03 DIAGNOSIS — R079 Chest pain, unspecified: Secondary | ICD-10-CM | POA: Diagnosis not present

## 2020-12-03 DIAGNOSIS — Z87891 Personal history of nicotine dependence: Secondary | ICD-10-CM | POA: Diagnosis not present

## 2020-12-03 DIAGNOSIS — E039 Hypothyroidism, unspecified: Secondary | ICD-10-CM | POA: Insufficient documentation

## 2020-12-03 DIAGNOSIS — I10 Essential (primary) hypertension: Secondary | ICD-10-CM | POA: Insufficient documentation

## 2020-12-03 DIAGNOSIS — Z955 Presence of coronary angioplasty implant and graft: Secondary | ICD-10-CM | POA: Diagnosis not present

## 2020-12-03 DIAGNOSIS — J45909 Unspecified asthma, uncomplicated: Secondary | ICD-10-CM | POA: Diagnosis not present

## 2020-12-03 LAB — CBC
HCT: 40.4 % (ref 36.0–46.0)
Hemoglobin: 13.4 g/dL (ref 12.0–15.0)
MCH: 31.7 pg (ref 26.0–34.0)
MCHC: 33.2 g/dL (ref 30.0–36.0)
MCV: 95.5 fL (ref 80.0–100.0)
Platelets: 339 10*3/uL (ref 150–400)
RBC: 4.23 MIL/uL (ref 3.87–5.11)
RDW: 12.9 % (ref 11.5–15.5)
WBC: 6.6 10*3/uL (ref 4.0–10.5)
nRBC: 0 % (ref 0.0–0.2)

## 2020-12-03 LAB — BASIC METABOLIC PANEL
Anion gap: 9 (ref 5–15)
BUN: 14 mg/dL (ref 6–20)
CO2: 23 mmol/L (ref 22–32)
Calcium: 9.3 mg/dL (ref 8.9–10.3)
Chloride: 106 mmol/L (ref 98–111)
Creatinine, Ser: 0.92 mg/dL (ref 0.44–1.00)
GFR, Estimated: >60 mL/min (ref >60)
Glucose, Bld: 156 mg/dL — ABNORMAL HIGH (ref 70–99)
Potassium: 3.5 mmol/L (ref 3.5–5.1)
Sodium: 138 mmol/L (ref 135–145)

## 2020-12-03 LAB — TROPONIN I (HIGH SENSITIVITY)
Troponin I (High Sensitivity): 3 ng/L (ref <18)
Troponin I (High Sensitivity): 5 ng/L (ref <18)

## 2020-12-03 LAB — BRAIN NATRIURETIC PEPTIDE: B Natriuretic Peptide: 81 pg/mL (ref 0.0–100.0)

## 2020-12-03 LAB — MAGNESIUM: Magnesium: 1.8 mg/dL (ref 1.7–2.4)

## 2020-12-03 MED ORDER — DILTIAZEM HCL 30 MG PO TABS: 120.0000 mg | ORAL_TABLET | Freq: Once | ORAL | Status: DC

## 2020-12-03 MED ORDER — SODIUM CHLORIDE 0.9 % IV BOLUS
1000.0000 mL | Freq: Once | INTRAVENOUS | Status: AC
  Administered 2020-12-03: 17:00:00 1000 mL via INTRAVENOUS

## 2020-12-03 MED ORDER — DILTIAZEM HCL ER COATED BEADS 120 MG PO CP24
360.0000 mg | ORAL_CAPSULE | Freq: Once | ORAL | Status: AC
  Administered 2020-12-03: 17:00:00 360 mg via ORAL
  Filled 2020-12-03: qty 3

## 2020-12-03 NOTE — ED Notes (Signed)
Dc instructions reviewed with pt no questions or concerns at this time. Will follow up with pcp 
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
## 2020-12-03 NOTE — ED Triage Notes (Signed)
Pt here from home with c/o chest pain , sob and, hx of afib , feels like she is afib on arrival

## 2020-12-03 NOTE — Telephone Encounter (Signed)
PATIENT NEEDS REFILL ON HER DEXILANT

## 2020-12-03 NOTE — Telephone Encounter (Signed)
Pt has refills and was instructed to contact her pharmacy.

## 2020-12-06 ENCOUNTER — Other Ambulatory Visit: Payer: BC Managed Care – PPO

## 2020-12-07 ENCOUNTER — Encounter: Payer: Self-pay | Admitting: Adult Health

## 2020-12-07 ENCOUNTER — Other Ambulatory Visit: Payer: Self-pay

## 2020-12-07 ENCOUNTER — Ambulatory Visit (INDEPENDENT_AMBULATORY_CARE_PROVIDER_SITE_OTHER): Payer: BC Managed Care – PPO | Admitting: Adult Health

## 2020-12-07 VITALS — BP 127/66 | HR 74 | Ht 67.0 in | Wt 271.0 lb

## 2020-12-07 DIAGNOSIS — N3941 Urge incontinence: Secondary | ICD-10-CM | POA: Diagnosis not present

## 2020-12-07 DIAGNOSIS — N898 Other specified noninflammatory disorders of vagina: Secondary | ICD-10-CM | POA: Diagnosis not present

## 2020-12-07 DIAGNOSIS — L292 Pruritus vulvae: Secondary | ICD-10-CM | POA: Insufficient documentation

## 2020-12-07 MED ORDER — OXYBUTYNIN CHLORIDE ER 10 MG PO TB24: 10.0000 mg | ORAL_TABLET | Freq: Every day | ORAL | 2 refills | Status: DC

## 2020-12-07 MED ORDER — TRIAMCINOLONE ACETONIDE 0.5 % EX OINT: TOPICAL_OINTMENT | CUTANEOUS | 1 refills | Status: DC

## 2020-12-07 MED ORDER — FLUCONAZOLE 150 MG PO TABS: ORAL_TABLET | ORAL | 0 refills | Status: DC

## 2020-12-07 NOTE — Progress Notes (Signed)
  Subjective:     Patient ID: Becky [name], female   DOB: 10-08-1960, 60 y.o.   MRN: [id number]  HPI Becky [name] is a 60 year old black female,married, PM in complaining of vaginal irritation and vulva itching for 1 week. Has not used any meds.   Lab Results  Component Value Date   DIAGPAP  12/21/2017    NEGATIVE FOR INTRAEPITHELIAL LESIONS OR MALIGNANCY.   HPV NOT DETECTED 12/21/2017   PCP is Dr Gerarda Fraction.  Review of Systems Has vaginal irritation and vulva itching Reviewed past medical,surgical, social and family history. Reviewed medications and allergies.     Objective:   Physical Exam BP 127/66 (BP Location: Left Arm, Patient Position: Sitting, Cuff Size: Large)   Pulse 74   Ht 5\' 7"  (1.702 m)   Wt 271 lb (122.9 kg)   BMI 42.44 kg/m     Skin warm and dry.Pelvic: external genitalia is normal in appearance no lesions, vagina: scant white discharge without odor,urethra has no lesions or masses noted, cervix:smooth and bulbous, uterus: normal size, shape and contour, non tender, no masses felt, adnexa: no masses or tenderness noted. Bladder is non tender and no masses felt. Fall risk is low  Upstream - 12/07/20 1314       Pregnancy Intention Screening   Does the patient want to become pregnant in the next year? No    Does the patient's partner want to become pregnant in the next year? No    Would the patient like to discuss contraceptive options today? No      Contraception Wrap Up   Current Method Female Sterilization    End Method Female Sterilization    Contraception Counseling Provided No            .  Examination chaperoned by Levy Pupa LPN  Assessment:     1. Vaginal irritation Will refill diflucan  2. Vulvar itching Will rx kenalog Meds ordered this encounter  Medications   fluconazole (DIFLUCAN) 150 MG tablet    Sig: Take 1 now and 1 in 3 days    Dispense:  8 tablet    Refill:  0    Order Specific Question:   Supervising Provider    Answer:   Tania Ade H [[id number]]   triamcinolone ointment (KENALOG) 0.5 %    Sig: Apply bid to external affected area prn    Dispense:  30 g    Refill:  1    Order Specific Question:   Supervising Provider    Answer:   Tania Ade H [[id number]]   oxybutynin (DITROPAN XL) 10 MG 24 hr tablet    Sig: Take 1 tablet (10 mg total) by mouth daily.    Dispense:  30 tablet    Refill:  2    Order Specific Question:   Supervising Provider    Answer:   Elonda Husky, LUTHER H [[id number]]     3. Urge incontinence of urine Will give trial ditropan Do not hold urine     Plan:     Return in January for pap and physical

## 2020-12-25 DIAGNOSIS — I83813 Varicose veins of bilateral lower extremities with pain: Secondary | ICD-10-CM | POA: Diagnosis not present
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
## 2020-12-27 ENCOUNTER — Ambulatory Visit (INDEPENDENT_AMBULATORY_CARE_PROVIDER_SITE_OTHER): Payer: BC Managed Care – PPO | Admitting: Orthopaedic Surgery

## 2020-12-27 ENCOUNTER — Encounter: Payer: Self-pay | Admitting: Family Medicine

## 2020-12-27 ENCOUNTER — Ambulatory Visit: Payer: Self-pay

## 2020-12-27 ENCOUNTER — Ambulatory Visit: Payer: BC Managed Care – PPO | Admitting: Family Medicine

## 2020-12-27 ENCOUNTER — Encounter: Payer: Self-pay | Admitting: Orthopaedic Surgery

## 2020-12-27 ENCOUNTER — Other Ambulatory Visit: Payer: Self-pay

## 2020-12-27 VITALS — Ht 67.0 in | Wt 271.0 lb

## 2020-12-27 DIAGNOSIS — G8929 Other chronic pain: Secondary | ICD-10-CM

## 2020-12-27 DIAGNOSIS — M545 Low back pain, unspecified: Secondary | ICD-10-CM | POA: Diagnosis not present

## 2020-12-27 DIAGNOSIS — M25551 Pain in right hip: Secondary | ICD-10-CM
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
## 2021-01-02 DIAGNOSIS — G4733 Obstructive sleep apnea (adult) (pediatric): Secondary | ICD-10-CM | POA: Diagnosis not present

## 2021-01-04 DIAGNOSIS — J069 Acute upper respiratory infection, unspecified: Secondary | ICD-10-CM | POA: Diagnosis not present

## 2021-01-08 ENCOUNTER — Telehealth: Payer: Self-pay | Admitting: Cardiology

## 2021-01-08 NOTE — Telephone Encounter (Signed)
Pt walked in, and asked that we fill out her handicap sticker form from the [facility] that her sticker had expired. Tolf pt I would call as soon as It was gave back to me. 01/08/21 MH

## 2021-01-10 ENCOUNTER — Telehealth: Payer: Self-pay | Admitting: Cardiology

## 2021-01-10 ENCOUNTER — Ambulatory Visit (HOSPITAL_COMMUNITY): Payer: BC Managed Care – PPO | Attending: Orthopaedic Surgery

## 2021-01-10 ENCOUNTER — Other Ambulatory Visit: Payer: Self-pay

## 2021-01-10 DIAGNOSIS — M25551 Pain in right hip: Secondary | ICD-10-CM | POA: Insufficient documentation

## 2021-01-10 DIAGNOSIS — G8929 Other chronic pain: Secondary | ICD-10-CM | POA: Insufficient documentation

## 2021-01-10 DIAGNOSIS — M6281 Muscle weakness (generalized): Secondary | ICD-10-CM | POA: Diagnosis not present

## 2021-01-10 DIAGNOSIS — M545 Low back pain, unspecified: Secondary | ICD-10-CM | POA: Insufficient documentation

## 2021-01-10 NOTE — Telephone Encounter (Signed)
Patient states she is returning a call, but she is unsure of who called. She assumes it is regarding her handicap sticker. Please return call when able.

## 2021-01-10 NOTE — Telephone Encounter (Signed)
Pt's DMV forms are ready for pick up at the front of [place] office.
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
## 2021-01-11 NOTE — Telephone Encounter (Signed)
Pt sent mychart message and left a voice mail regarding the pick up of DMV paperwork.

## 2021-01-17 ENCOUNTER — Telehealth (HOSPITAL_COMMUNITY): Payer: Self-pay | Admitting: Physical Therapy

## 2021-01-17 ENCOUNTER — Encounter (HOSPITAL_COMMUNITY): Payer: BC Managed Care – PPO | Admitting: Physical Therapy

## 2021-01-17 NOTE — Telephone Encounter (Signed)
First no show:  Pt called, pt overslept.  Advised pt of her next appointment and that this will be counted as her first no show.  Rayetta Humphrey, [place] CLT [phone]

## 2021-01-18 ENCOUNTER — Other Ambulatory Visit: Payer: BC Managed Care – PPO | Admitting: Adult Health

## 2021-01-23 ENCOUNTER — Other Ambulatory Visit: Payer: Self-pay

## 2021-01-23 ENCOUNTER — Ambulatory Visit (HOSPITAL_COMMUNITY): Payer: BC Managed Care – PPO | Admitting: Physical Therapy

## 2021-01-23 ENCOUNTER — Encounter (HOSPITAL_COMMUNITY): Payer: Self-pay | Admitting: Physical Therapy

## 2021-01-23 DIAGNOSIS — M6281 Muscle weakness (generalized): Secondary | ICD-10-CM | POA: Diagnosis not present

## 2021-01-23 DIAGNOSIS — G8929 Other chronic pain: Secondary | ICD-10-CM

## 2021-01-23 DIAGNOSIS — M545 Low back pain, unspecified: Secondary | ICD-10-CM | POA: Diagnosis not present

## 2021-01-23 DIAGNOSIS — M25551 Pain in right hip: Secondary | ICD-10-CM | POA: Diagnosis not present
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
## 2021-01-23 NOTE — Patient Instructions (Signed)
Access Code: [email or web address] URL: [email or web address] Date: 01/23/2021 Prepared by: Josue Hector  Exercises Supine Transversus Abdominis Bracing - Hands on Stomach - 2-3 x daily - 7 x weekly - 2 sets - 10 reps - 5 second hold Supine March - 2-3 x daily - 7 x weekly - 2 sets - 10 reps Supine Bridge - 2-3 x daily - 7 x weekly - 2 sets - 10 reps - 5 second hold

## 2021-01-24 ENCOUNTER — Ambulatory Visit: Payer: BC Managed Care – PPO | Admitting: Orthopaedic Surgery

## 2021-01-25 ENCOUNTER — Encounter (HOSPITAL_COMMUNITY): Payer: BC Managed Care – PPO | Admitting: Physical Therapy

## 2021-01-25 DIAGNOSIS — Z1231 Encounter for screening mammogram for malignant neoplasm of breast: Secondary | ICD-10-CM | POA: Diagnosis not present

## 2021-01-28 ENCOUNTER — Encounter (HOSPITAL_COMMUNITY): Payer: BC Managed Care – PPO

## 2021-01-29 ENCOUNTER — Emergency Department (HOSPITAL_COMMUNITY): Payer: BC Managed Care – PPO

## 2021-01-29 ENCOUNTER — Encounter (HOSPITAL_COMMUNITY): Payer: Self-pay | Admitting: *Deleted

## 2021-01-29 ENCOUNTER — Telehealth: Payer: Self-pay | Admitting: Internal Medicine

## 2021-01-29 ENCOUNTER — Emergency Department (HOSPITAL_COMMUNITY)
Admission: EM | Admit: 2021-01-29 | Discharge: 2021-01-29 | Disposition: A | Discharge status: Home / Self Care | Payer: BC Managed Care – PPO | Attending: Emergency Medicine | Admitting: Emergency Medicine

## 2021-01-29 DIAGNOSIS — Z7901 Long term (current) use of anticoagulants: Secondary | ICD-10-CM | POA: Diagnosis not present

## 2021-01-29 DIAGNOSIS — R109 Unspecified abdominal pain: Secondary | ICD-10-CM | POA: Diagnosis not present

## 2021-01-29 DIAGNOSIS — Z79899 Other long term (current) drug therapy: Secondary | ICD-10-CM | POA: Diagnosis not present

## 2021-01-29 DIAGNOSIS — K625 Hemorrhage of anus and rectum: Secondary | ICD-10-CM | POA: Diagnosis not present

## 2021-01-29 DIAGNOSIS — R1032 Left lower quadrant pain: Secondary | ICD-10-CM | POA: Diagnosis not present

## 2021-01-29 DIAGNOSIS — K760 Fatty (change of) liver, not elsewhere classified: Secondary | ICD-10-CM | POA: Diagnosis not present

## 2021-01-29 DIAGNOSIS — I700 Atherosclerosis of aorta: Secondary | ICD-10-CM | POA: Diagnosis not present

## 2021-01-29 LAB — CBC WITH DIFFERENTIAL/PLATELET
Abs Immature Granulocytes: 0.03 10*3/uL (ref 0.00–0.07)
Basophils Absolute: 0 10*3/uL (ref 0.0–0.1)
Basophils Relative: 0 %
Eosinophils Absolute: 0.2 10*3/uL (ref 0.0–0.5)
Eosinophils Relative: 3 %
HCT: 38.5 % (ref 36.0–46.0)
Hemoglobin: 12.5 g/dL (ref 12.0–15.0)
Immature Granulocytes: 1 %
Lymphocytes Relative: 40 %
Lymphs Abs: 2.5 10*3/uL (ref 0.7–4.0)
MCH: 30.3 pg (ref 26.0–34.0)
MCHC: 32.5 g/dL (ref 30.0–36.0)
MCV: 93.4 fL (ref 80.0–100.0)
Monocytes Absolute: 0.7 10*3/uL (ref 0.1–1.0)
Monocytes Relative: 11 %
Neutro Abs: 2.8 10*3/uL (ref 1.7–7.7)
Neutrophils Relative %: 45 %
Platelets: 379 10*3/uL (ref 150–400)
RBC: 4.12 MIL/uL (ref 3.87–5.11)
RDW: 13.1 % (ref 11.5–15.5)
WBC: 6.2 10*3/uL (ref 4.0–10.5)
nRBC: 0 % (ref 0.0–0.2)

## 2021-01-29 LAB — URINALYSIS, ROUTINE W REFLEX MICROSCOPIC
Bacteria, UA: NONE SEEN
Bilirubin Urine: NEGATIVE
Glucose, UA: NEGATIVE mg/dL
Ketones, ur: NEGATIVE mg/dL
Leukocytes,Ua: NEGATIVE
Nitrite: NEGATIVE
Protein, ur: NEGATIVE mg/dL
Specific Gravity, Urine: 1.033 — ABNORMAL HIGH (ref 1.005–1.030)
pH: 6 (ref 5.0–8.0)

## 2021-01-29 LAB — POC OCCULT BLOOD, ED: Fecal Occult Bld: POSITIVE — AB

## 2021-01-29 LAB — COMPREHENSIVE METABOLIC PANEL
ALT: 23 U/L (ref 0–44)
AST: 16 U/L (ref 15–41)
Albumin: 4 g/dL (ref 3.5–5.0)
Alkaline Phosphatase: 57 U/L (ref 38–126)
Anion gap: 9 (ref 5–15)
BUN: 17 mg/dL (ref 6–20)
CO2: 20 mmol/L — ABNORMAL LOW (ref 22–32)
Calcium: 9.1 mg/dL (ref 8.9–10.3)
Chloride: 107 mmol/L (ref 98–111)
Creatinine, Ser: 1.06 mg/dL — ABNORMAL HIGH (ref 0.44–1.00)
GFR, Estimated: >60 mL/min (ref >60)
Glucose, Bld: 120 mg/dL — ABNORMAL HIGH (ref 70–99)
Potassium: 4 mmol/L (ref 3.5–5.1)
Sodium: 136 mmol/L (ref 135–145)
Total Bilirubin: 0.6 mg/dL (ref 0.3–1.2)
Total Protein: 6.9 g/dL (ref 6.5–8.1)

## 2021-01-29 LAB — TYPE AND SCREEN
ABO/RH(D): O POS
Antibody Screen: NEGATIVE

## 2021-01-29 MED ORDER — IOHEXOL 300 MG/ML  SOLN
100.0000 mL | Freq: Once | INTRAMUSCULAR | Status: AC | PRN
  Administered 2021-01-29: 14:00:00 100 mL via INTRAVENOUS

## 2021-01-29 NOTE — Telephone Encounter (Signed)
Pt is having rectal bleeding and is on blood thinners. She is aware of OV with [name], NP on 2/21. She has questions about if she needs to come off her blood thinner or not and may just go to the ER instead. [phone]

## 2021-01-29 NOTE — ED Triage Notes (Signed)
Rectal bleeding for over a week

## 2021-01-29 NOTE — Telephone Encounter (Signed)
Noted  

## 2021-01-29 NOTE — Telephone Encounter (Signed)
Returned the pt's call and was advised by her that she has had rectal bleeding for 11 days now!!! Any type of pressure she puts in the area she knows to go sit on the toilet because it is coming out. She wanted to know should she stop her blood thinner. I advised her not to stop the blood thinner but go to the ED due to the fact it has been 11 days and it has not let up any for her. Also that they are going to want to check her blood levels and may need to do some testing. The pt agreed to go to the ED to be evaluated.

## 2021-01-29 NOTE — Telephone Encounter (Signed)
Noted. Agree with ED evaluation. Appears patient is in the ED now.

## 2021-01-29 NOTE — Discharge Instructions (Addendum)
STOP the XARELTO until told by your gastroenterologist. They will call you for an urgent appointment to take care of the bleeding.  If you develop new or worsening bleeding, abdominal pain, dizziness, or any other new/concerning symptoms then return to the ER for evaluation.

## 2021-01-29 NOTE — Telephone Encounter (Signed)
noted 

## 2021-01-29 NOTE — ED Provider Notes (Addendum)
[facility] EMERGENCY DEPARTMENT Provider Note   CSN: [id number] Arrival date & time: 01/29/21  1207     History  Chief Complaint  Patient presents with   Rectal Bleeding    Becky [name] is a 61 y.o. female.  HPI 61 year old female presents with rectal bleeding for about 11 days.  Called her GI doctor who told her to go to the ER.  She denies rectal pain.  She states that she is having multiple episodes of bleeding per day.  Its in between dark and light red.  Sometimes there is stool and sometimes if she is passing gas she will have bleeding.  She thinks she has seen some clots as well.  She is on Xarelto for paroxysmal atrial fibrillation.  She has had GI bleeding in the past and most recently was told it was from hemorrhoids.  Some on and off lightheadedness.  She has been having intermittent left lower quadrant abdominal discomfort over the last couple days.  Home Medications Prior to Admission medications   Medication Sig Start Date End Date Taking? Authorizing Provider  acetaminophen (TYLENOL) 500 MG tablet Take 1,000 mg by mouth every 8 (eight) hours as needed for moderate pain or headache.   Yes [provider]  budesonide (RHINOCORT AQUA) 32 MCG/ACT nasal spray Place 1 spray into both nostrils daily as needed for rhinitis.   Yes [provider]  cholecalciferol (VITAMIN D3) 25 MCG (1000 UNIT) tablet Take 1,000 Units by mouth daily.   Yes [provider]  diltiazem (CARDIZEM CD) 360 MG 24 hr capsule TAKE 1 CAPSULE BY MOUTH EVERY DAY 08/16/20  Yes Verta Ellen., NP  fluconazole (DIFLUCAN) 150 MG tablet Take 1 now and 1 in 3 days 12/07/20  Yes Derrek Monaco A, NP  fluticasone (FLONASE) 50 MCG/ACT nasal spray Place 2 sprays into both nostrils daily. 02/02/20  Yes Melynda Ripple, MD  furosemide (LASIX) 40 MG tablet Take 1 tablet (40 mg total) by mouth daily as needed. Patient taking differently: Take 40 mg by mouth daily as needed for fluid.  07/19/19  Yes Arnoldo Lenis, MD  Glycerin-Polysorbate 80 (REFRESH DRY EYE THERAPY OP) Apply 1 drop to eye daily as needed (dry eyes).   Yes [provider]  hydrocortisone (ANUSOL-HC) 25 MG suppository Place 25 mg rectally 2 (two) times daily. 07/23/20  Yes [provider]  Lifitegrast Shirley Friar) 5 % SOLN Place 1 drop into both eyes 2 (two) times daily as needed (dry eyes).   Yes [provider]  linaclotide Rolan Lipa) 72 MCG capsule Take 1 capsule (72 mcg total) by mouth daily before breakfast. 01/11/20  Yes Eloise Harman, DO  metoprolol tartrate (LOPRESSOR) 25 MG tablet Take 1 tablet (25 mg total) by mouth 2 (two) times daily. 01/12/20  Yes Strader, [place] M, PA-C  montelukast (SINGULAIR) 10 MG tablet Take 10 mg by mouth daily. 08/25/16  Yes [provider]  olmesartan (BENICAR) 40 MG tablet Take 1 tablet (40 mg total) by mouth daily. 04/06/20  Yes BranchAlphonse Guild, MD  rosuvastatin (CRESTOR) 5 MG tablet Take 1 tablet (5 mg total) by mouth daily. 07/19/19  Yes Arnoldo Lenis, MD  spironolactone (ALDACTONE) 25 MG tablet TAKE 1 TABLET BY MOUTH EVERY DAY 09/24/20  Yes Branch, Alphonse Guild, MD  XARELTO 20 MG TABS tablet TAKE 1 TABLET BY MOUTH EVERY DAY 05/30/20  Yes Strader, [place] M, PA-C  DEXILANT 60 MG capsule TAKE 1 CAPSULE BY MOUTH EVERY DAY Patient not  taking: Reported on 01/29/2021 02/15/20   [name], NP  EPINEPHrine 0.3 mg/0.3 mL IJ SOAJ injection Inject 0.3 mg into the muscle once as needed for anaphylaxis. 04/20/18   [provider]  ondansetron (ZOFRAN) 4 MG tablet Take 1 tablet (4 mg total) by mouth every 8 (eight) hours as needed for nausea or vomiting. Patient not taking: Reported on 01/29/2021 09/02/18   Shuford, Olivia Mackie, PA-C  oxybutynin (DITROPAN XL) 10 MG 24 hr tablet Take 1 tablet (10 mg total) by mouth daily. Patient not taking: Reported on 01/29/2021 12/07/20 12/07/21  Estill Dooms, NP  triamcinolone ointment (KENALOG) 0.5 % Apply  bid to external affected area prn Patient not taking: Reported on 01/29/2021 12/07/20   Estill Dooms, NP      Allergies    Hydralazine, Other, and Prednisone    Review of Systems   Review of Systems  Gastrointestinal:  Positive for abdominal pain and blood in stool. Negative for rectal pain.  Neurological:  Positive for light-headedness.   Physical Exam Updated Vital Signs BP 135/86    Pulse 69    Temp 98.3 F (36.8 C) (Oral)    Resp (!) 24    Ht 5\' 7"  (1.702 m)    Wt 122 kg    SpO2 99%    BMI 42.13 kg/m  Physical Exam Vitals and nursing note reviewed.  Constitutional:      General: She is not in acute distress.    Appearance: She is well-developed. She is obese. She is not ill-appearing or diaphoretic.  HENT:     Head: Normocephalic and atraumatic.  Cardiovascular:     Rate and Rhythm: Normal rate and regular rhythm.     Heart sounds: Normal heart sounds.  Pulmonary:     Effort: Pulmonary effort is normal.     Breath sounds: Normal breath sounds.  Abdominal:     Palpations: Abdomen is soft.     Tenderness: There is abdominal tenderness (mild) in the left lower quadrant.  Skin:    General: Skin is warm and dry.  Neurological:     Mental Status: She is alert.    ED Results / Procedures / Treatments   Labs (all labs ordered are listed, but only abnormal results are displayed) Labs Reviewed  COMPREHENSIVE METABOLIC PANEL - Abnormal; Notable for the following components:      Result Value   CO2 20 (*)    Glucose, Bld 120 (*)    Creatinine, Ser 1.06 (*)    All other components within normal limits  POC OCCULT BLOOD, ED - Abnormal; Notable for the following components:   Fecal Occult Bld POSITIVE (*)    All other components within normal limits  CBC WITH DIFFERENTIAL/PLATELET  URINALYSIS, ROUTINE W REFLEX MICROSCOPIC  TYPE AND SCREEN    EKG None  Radiology CT ABDOMEN PELVIS W CONTRAST  Result Date: 01/29/2021 CLINICAL DATA:  Left lower quadrant, left  pelvic pain times 2-3 days. EXAM: CT ABDOMEN AND PELVIS WITH CONTRAST TECHNIQUE: Multidetector CT imaging of the abdomen and pelvis was performed using the standard protocol following bolus administration of intravenous contrast. RADIATION DOSE REDUCTION: This exam was performed according to the departmental dose-optimization program which includes automated exposure control, adjustment of the mA and/or kV according to patient size and/or use of iterative reconstruction technique. CONTRAST:  162mL OMNIPAQUE IOHEXOL 300 MG/ML  SOLN COMPARISON:  Multiple priors including most recent CT July 18, 2020 FINDINGS: Lower chest: No acute abnormality. Hepatobiliary: Diffuse hepatic  steatosis. Hyperdense 13 mm focus in the right hepatic dome on image 10/2 is stable dating back to at least August 24, 2017 consistent with a benign etiology such as transient hepatic attenuation difference, intrahepatic shunt or hemangioma. Gallbladder is unremarkable. No biliary ductal dilation. Pancreas: No pancreatic ductal dilation or evidence of acute inflammation. Spleen: Normal in size without focal abnormality. Adrenals/Urinary Tract: Left adrenal nodule measuring 16 mm on image 20/2 is stable dating back to at least 2019 and was low-density with Hounsfield units of 6 on prior non contrasted renal stone CT from January 22, 2018, consistent with a benign adrenal adenoma. Right adrenal glands unremarkable. No hydronephrosis. Hypodense 18 mm right lower pole renal cyst. The kidneys demonstrate symmetric enhancement and excretion of contrast material. No suspicious filling defect visualized within the opacified portions of the collecting systems or proximal ureters on delayed imaging. No solid enhancing renal mass. Urinary bladder is unremarkable for degree of distension. Stomach/Bowel: No enteric contrast was administered. Stomach is predominantly decompressed limiting evaluation the wall. No pathologic dilation of small or large bowel. The  terminal ileum appears normal. The appendix appears normal. Colonic diverticulosis without findings of acute diverticulitis. No evidence of acute bowel inflammation. Vascular/Lymphatic: Aortic atherosclerosis without abdominal aortic aneurysm. No pathologically enlarged abdominal or pelvic lymph nodes. Reproductive: Uterus and bilateral adnexa are unremarkable. Other: No significant abdominopelvic free fluid. Musculoskeletal: Multilevel degenerative changes spine. No acute osseous abnormality. IMPRESSION: 1. No acute abdominopelvic pathology. 2. Colonic diverticulosis without findings of acute diverticulitis. 3. Hepatic steatosis. 4. Stable left adrenal adenoma. 5. Aortic Atherosclerosis (ICD10-I70.0). Electronically Signed   By: Dahlia Bailiff M.D.   On: 01/29/2021 14:56    Procedures Procedures    Medications Ordered in ED Medications  iohexol (OMNIPAQUE) 300 MG/ML solution 100 mL (100 mLs Intravenous Contrast Given 01/29/21 1409)    ED Course/ Medical Decision Making/ A&P                            Patient presents with rectal bleeding for 10+ days.  She is not hemodynamically unstable and has a normal hemoglobin that is stable compared to baseline.  She is Hemoccult positive, which the nurse had performed, but there was no gross blood and no obvious masses.  She does have some very mild left lower quadrant tenderness and given her history of diverticulosis a CT was obtained.  I personally reviewed these images and on my interpretation there is no acute diverticulitis.  Other labs are unremarkable.  She does endorse a little bit of on and off dysuria and so a urinalysis has been sent. UA negative except for hematuria. No infection  I discussed her case with Dr. Jenetta Downer, who has reviewed the chart and spoken with his colleagues.  He would like for her to stop Xarelto and she will be discharged to follow-up urgently in their clinic for banding of hemorrhoids.  Most recent colonoscopy reviewed  and there was concern for hemorrhoids.    Final Clinical Impression(s) / ED Diagnoses Final diagnoses:  Rectal bleeding    Rx / DC Orders ED Discharge Orders     None         Sherwood Gambler, MD 01/29/21 1478    Sherwood Gambler, MD 01/29/21 [phone]

## 2021-01-30 ENCOUNTER — Telehealth: Payer: Self-pay | Admitting: Gastroenterology

## 2021-01-30 NOTE — Telephone Encounter (Signed)
Patient was seen in the ED yesterday for rectal bleeding. Hemoglobin was normal at 12.5. She was advised to hold her Xarelto and follow-up with our office for hemorrhoid banding.   Please call patient in the morning to see how she is doing. Is she still bleeding? Does she have anusol cream? If she has anusol cream, she needs to be using this twice daily.   We need to try to get her an OV ASAP with [facility] for banding. Please keep a look out on her schedule for cancellations.

## 2021-01-31 ENCOUNTER — Other Ambulatory Visit: Payer: Self-pay | Admitting: Gastroenterology

## 2021-01-31 ENCOUNTER — Other Ambulatory Visit: Payer: Self-pay

## 2021-01-31 ENCOUNTER — Ambulatory Visit (HOSPITAL_COMMUNITY): Payer: BC Managed Care – PPO

## 2021-01-31 ENCOUNTER — Encounter (HOSPITAL_COMMUNITY): Payer: Self-pay

## 2021-01-31 ENCOUNTER — Telehealth: Payer: Self-pay | Admitting: Gastroenterology

## 2021-01-31 DIAGNOSIS — M545 Low back pain, unspecified: Secondary | ICD-10-CM | POA: Diagnosis not present

## 2021-01-31 DIAGNOSIS — K641 Second degree hemorrhoids: Secondary | ICD-10-CM

## 2021-01-31 DIAGNOSIS — G8929 Other chronic pain: Secondary | ICD-10-CM | POA: Diagnosis not present

## 2021-01-31 DIAGNOSIS — M6281 Muscle weakness (generalized): Secondary | ICD-10-CM

## 2021-01-31 DIAGNOSIS — M25551 Pain in right hip: Secondary | ICD-10-CM | POA: Diagnosis not present

## 2021-01-31 DIAGNOSIS — K625 Hemorrhage of anus and rectum: Secondary | ICD-10-CM

## 2021-01-31 DIAGNOSIS — K649 Unspecified hemorrhoids: Secondary | ICD-10-CM

## 2021-01-31 MED ORDER — HYDROCORTISONE (PERIANAL) 2.5 % EX CREA: 1.0000 "application " | TOPICAL_CREAM | Freq: Two times a day (BID) | CUTANEOUS | 1 refills | Status: DC

## 2021-01-31 NOTE — Telephone Encounter (Signed)
Spoke to pt, she informed me that she is feeling a little better. She states she has had a couple bowel movements, but feels like she still needs to go more and nothing is coming out. She still has blood when she has bowel movements, stools are soft and formed. Pt would like to know if she should continue to hold Xarelto and for how long. She also needs anusol cream sent to pharmacy.

## 2021-01-31 NOTE — Patient Instructions (Addendum)
Abdominals: Single Leg Bend    Lying on back with legs out straight, inhale, then exhale while slowly sliding heel along floor toward buttocks. Slowly return to starting position. Repeat _10-15___ times each leg per set. Do _1-2___ sets per session. Do __1__ sessions per day.  Copyright  VHI. All rights reserved.   Log Roll    Lying on back, bend left knee and place left arm across chest. Roll all in one movement to the right. Reverse to roll to the left. Always move as one unit.   Copyright  VHI. All rights reserved.   Bracing With Leg March (Hook-Lying)    With neutral spine, tighten pelvic floor and abdominals and hold. Alternating legs, lift foot 6-9___ inches and return to floor. Repeat _10-15__ times. Do _1__ times a day.   Copyright  VHI. All rights reserved.   Low Back Stretch: One leg (Supine)    Lying on back, bring one knee toward chest by pulling gently behind knee. Hold _30___ seconds. Repeat with other leg.  Copyright  VHI. All rights reserved.   Lower Trunk Rotation Stretch    Keeping back flat and feet together, rotate knees to left side. Hold _30___ seconds. Repeat _2-3___ times per set. Do _1___ sets per session. Do __1__ sessions per day.  http://orth.exer.us/123   Copyright  VHI. All rights reserved.

## 2021-01-31 NOTE — Telephone Encounter (Signed)
Noted. I suspect the sensation of needing to have a BM is secondary to the hemorrhoids themselves. I have sent in a Rx for anusol cream. Plese see separate note from today where [name], NP gave recommendations regarding Xarelto management and monitoring of hemoglobin. Coralyn Helling has already relayed recommendations to the patient.    Will defer any additional recommendations to [facility] as needed.

## 2021-01-31 NOTE — Telephone Encounter (Signed)
No, she needs to resume her anticoagulation. Has bleeding tapered? If so, she can resume tomorrow. Please have her repeat CBC on Monday. Thanks!

## 2021-01-31 NOTE — Telephone Encounter (Signed)
Noted  

## 2021-01-31 NOTE — Telephone Encounter (Signed)
Patient called with a question about her blood thinner  she was taken off of it and wants to know if she is supposed to stay off it until her banding

## 2021-01-31 NOTE — Telephone Encounter (Signed)
Phoned and spoke with the pt and was advised that the bleeding is the same as it was the other day. Any type of pressure or going to have a BM blood still come out in the toilet. Pt advised to resume blood thinner tomorrow and repeat her CBC on Monday. Pt expressed understanding . Will put labs in for [facility] for Monday.

## 2021-01-31 NOTE — Telephone Encounter (Signed)
Becky [name], Pt has appt with you for a banding on 2/21. She was taken off blood thinner in the ER and wants to know if she is suppose to stay off it until her banding or not. Please advise
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
## 2021-02-02 DIAGNOSIS — G4733 Obstructive sleep apnea (adult) (pediatric): Secondary | ICD-10-CM | POA: Diagnosis not present

## 2021-02-04 DIAGNOSIS — Z23 Encounter for immunization: Secondary | ICD-10-CM | POA: Diagnosis not present

## 2021-02-04 DIAGNOSIS — K649 Unspecified hemorrhoids: Secondary | ICD-10-CM | POA: Diagnosis not present

## 2021-02-04 DIAGNOSIS — E669 Obesity, unspecified: Secondary | ICD-10-CM | POA: Diagnosis not present

## 2021-02-04 DIAGNOSIS — Z6837 Body mass index (BMI) 37.0-37.9, adult: Secondary | ICD-10-CM | POA: Diagnosis not present

## 2021-02-04 DIAGNOSIS — Z0189 Encounter for other specified special examinations: Secondary | ICD-10-CM | POA: Diagnosis not present

## 2021-02-04 DIAGNOSIS — J329 Chronic sinusitis, unspecified: Secondary | ICD-10-CM | POA: Diagnosis not present

## 2021-02-05 ENCOUNTER — Other Ambulatory Visit: Payer: Self-pay

## 2021-02-05 ENCOUNTER — Encounter (HOSPITAL_COMMUNITY): Payer: Self-pay | Admitting: Physical Therapy

## 2021-02-05 ENCOUNTER — Ambulatory Visit (HOSPITAL_COMMUNITY): Payer: BC Managed Care – PPO | Admitting: Physical Therapy

## 2021-02-05 DIAGNOSIS — M25551 Pain in right hip: Secondary | ICD-10-CM | POA: Diagnosis not present

## 2021-02-05 DIAGNOSIS — M545 Low back pain, unspecified: Secondary | ICD-10-CM | POA: Diagnosis not present

## 2021-02-05 DIAGNOSIS — M6281 Muscle weakness (generalized): Secondary | ICD-10-CM | POA: Diagnosis not present

## 2021-02-05 DIAGNOSIS — G8929 Other chronic pain: Secondary | ICD-10-CM
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
## 2021-02-05 NOTE — Patient Instructions (Signed)
Access Code: [email or web address] URL: [email or web address] Date: 02/05/2021 Prepared by: Josue Hector  Exercises Supine Transversus Abdominis Bracing - Hands on Stomach - 1 x daily - 3-4 x weekly - 1 sets - 10 reps - 5 second hold Supine March - 1 x daily - 3-4 x weekly - 2 sets - 10 reps Supine Bridge - 1 x daily - 3-4 x weekly - 2 sets - 10 reps - 5 second hold Supine Lower Trunk Rotation - 1 x daily - 3-4 x weekly - 1 sets - 5 reps - 10 second hold Supine Piriformis Stretch with Foot on Ground - 1 x daily - 3-4 x weekly - 1 sets - 5 reps - 10 second hold Small Range Straight Leg Raise - 1 x daily - 3-4 x weekly - 2 sets - 10 reps Sidelying Hip Abduction - 1 x daily - 3-4 x weekly - 2 sets - 10 reps Sit to Stand Without Arm Support - 1 x daily - 3-4 x weekly - 2 sets - 10 reps Standing Heel Raise with Support - 1 x daily - 3-4 x weekly - 2 sets - 10 reps Hip Abduction with Resistance Loop - 1 x daily - 3-4 x weekly - 2 sets - 10 reps

## 2021-02-06 ENCOUNTER — Ambulatory Visit: Payer: BC Managed Care – PPO | Admitting: Cardiology

## 2021-02-07 ENCOUNTER — Ambulatory Visit: Payer: BC Managed Care – PPO | Admitting: Orthopaedic Surgery

## 2021-02-07 ENCOUNTER — Encounter (HOSPITAL_COMMUNITY): Payer: BC Managed Care – PPO

## 2021-02-07 ENCOUNTER — Other Ambulatory Visit: Payer: Self-pay

## 2021-02-13 ENCOUNTER — Telehealth: Payer: Self-pay | Admitting: Gastroenterology

## 2021-02-13 NOTE — Telephone Encounter (Signed)
[phone] patient called asking about her medication  states she is still having some bleeding and wants to know is she supposed to have more labs done

## 2021-02-13 NOTE — Telephone Encounter (Signed)
FYI  Phoned and LMOVM for the pt to go the lab and have CBC done and the other labs done are where we can see them in her chart

## 2021-02-13 NOTE — Telephone Encounter (Signed)
I need labs from other provider ASAP. If it was not done within the past week, needs CBC now.   See if we can put her on at 130 on Monday for banding.

## 2021-02-13 NOTE — Telephone Encounter (Signed)
Pt stopped her [place]oss 3 days ago because she is still continuing to bleed. She has had more CBC bloodwork done by another Dr. I advised her that I would send you a phone note to see what you wanted her to do. There is already a lab form in the system if needed also. Pt is at work and advised me just to leave a message on her phone if she doesn't answer. Please advise

## 2021-02-14 ENCOUNTER — Ambulatory Visit (INDEPENDENT_AMBULATORY_CARE_PROVIDER_SITE_OTHER): Payer: BC Managed Care – PPO | Admitting: Orthopaedic Surgery

## 2021-02-14 ENCOUNTER — Other Ambulatory Visit: Payer: Self-pay

## 2021-02-14 ENCOUNTER — Encounter: Payer: Self-pay | Admitting: Orthopaedic Surgery

## 2021-02-14 VITALS — Ht 67.0 in | Wt 267.0 lb

## 2021-02-14 DIAGNOSIS — M545 Low back pain, unspecified: Secondary | ICD-10-CM | POA: Diagnosis not present

## 2021-02-14 DIAGNOSIS — M47816 Spondylosis without myelopathy or radiculopathy, lumbar region: Secondary | ICD-10-CM

## 2021-02-14 DIAGNOSIS — G8929 Other chronic pain: Secondary | ICD-10-CM | POA: Diagnosis not present

## 2021-02-14 MED ORDER — DIAZEPAM 5 MG PO TABS: ORAL_TABLET | ORAL | 0 refills | Status: DC
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
## 2021-02-15 DIAGNOSIS — Z0189 Encounter for other specified special examinations: Secondary | ICD-10-CM | POA: Diagnosis not present

## 2021-02-18 ENCOUNTER — Encounter: Payer: Self-pay | Admitting: Gastroenterology

## 2021-02-18 ENCOUNTER — Telehealth: Payer: Self-pay

## 2021-02-18 ENCOUNTER — Other Ambulatory Visit: Payer: Self-pay

## 2021-02-18 ENCOUNTER — Ambulatory Visit (INDEPENDENT_AMBULATORY_CARE_PROVIDER_SITE_OTHER): Payer: BC Managed Care – PPO | Admitting: Gastroenterology

## 2021-02-18 ENCOUNTER — Telehealth: Payer: BC Managed Care – PPO | Admitting: Internal Medicine

## 2021-02-18 VITALS — BP 147/83 | HR 68 | Temp 97.7°F | Ht 67.0 in | Wt 266.2 lb

## 2021-02-18 DIAGNOSIS — K641 Second degree hemorrhoids: Secondary | ICD-10-CM | POA: Diagnosis not present

## 2021-02-18 MED ORDER — LUBIPROSTONE 8 MCG PO CAPS: 8.0000 ug | ORAL_CAPSULE | Freq: Two times a day (BID) | ORAL | 3 refills | Status: DC

## 2021-02-18 MED ORDER — PANTOPRAZOLE SODIUM 40 MG PO TBEC: 40.0000 mg | DELAYED_RELEASE_TABLET | Freq: Every day | ORAL | 3 refills | Status: DC

## 2021-02-18 NOTE — Progress Notes (Addendum)
° ° ° ° °  Becky [name] Note:   [name] is a 61 y.o. female presenting today for consideration of hemorrhoid [name]. Last colonoscopy July 2022 while inpatient for GI bleeding with diverticulosis and adenomas. 5 year surveillance recommended. Felt bleeding most likely from hemorrhoids and less likely diverticular bleeding. She is on Xarelto for afib. She has had [name] neutrally. Recently with worsening rectal bleeding although Hgb normal. We are requesting outside labs. She notes bleeding with each bowel movement. Linzess even at lowest dosage is too strong, with multiple bowel movements and loose stool while taking this. Hgb on 2/10 was 11.4, slightly down from January when it was 12.5.    The patient presents with symptomatic grade 2 hemorrhoids, unresponsive to maximal medical therapy, requesting rubber band ligation of her hemorrhoidal disease. All risks, benefits, and alternative forms of therapy were described and informed consent was obtained.  The decision was made to band the right posterior internal hemorrhoid, but there was not sufficient tissue once band was released. I then turned attention to the right anterior column, and the [place] was used to perform band ligation without complication. Stool was in rectal vault and difficult to feel band thereafter. She had no pain or concerns following band placement. The patient was discharged home without pain or other issues. Dietary and behavioral recommendations were given and (if necessary prescriptions were given), along with follow-up instructions. The patient will return in  followup and possible additional [name] as required. Latex-free bands were used.   No complications were encountered and the patient tolerated the procedure well.   [name], PhD, ANP-BC [facility]

## 2021-02-18 NOTE — Telephone Encounter (Signed)
Pt called to say that [facility] ([place]) told her that her insurance will not cover the [place]. Please advise. [phone]

## 2021-02-18 NOTE — Telephone Encounter (Signed)
error 

## 2021-02-18 NOTE — Telephone Encounter (Signed)
PA  done for Becky [name]  68mcg. Pt has tried and failed Linzess 72 mcg, Linzess 142mcg and Trulance 3mg . Dx used:K59.04 and other constipation

## 2021-02-18 NOTE — Patient Instructions (Signed)
For reflux: start taking pantoprazole (Protonix) once each day, 30 minutes before breakfast.  For constipation: instead of Linzess, let's try Amitiza. You can take this twice a day WITH FOOD (to avoid nausea).   We will see you back in a few weeks!  I enjoyed seeing you again today! As you know, I value our relationship and want to provide genuine, compassionate, and quality care. I welcome your feedback. If you receive a survey regarding your visit,  I greatly appreciate you taking time to fill this out. See you next time!  [name], PhD, ANP-BC [facility]

## 2021-02-21 ENCOUNTER — Telehealth: Payer: Self-pay

## 2021-02-21 NOTE — Telephone Encounter (Signed)
FYI:  Becky [name],  Returned the pt's call and was advised by her that ne of the 2 bands came off today. Her bleeding has not stopped. She stated she feels ok , just wanted you to know. She did state also that the pressure was still there like she had to have a BM. I advised the pt to remember limit toilet time, avoid straining and if the bleeding gets worse and she starts to feel light head, faint, dizziness to go to the ED to be evaluated. Pt expressed understanding of this

## 2021-02-21 NOTE — Telephone Encounter (Signed)
Becky [name],  The pt's has no Rx benefits on her insurance nor her husbands. So therefore she will have to pay out of pocket no matter what we prescribe for her . Please advise and or we can do some samples for her

## 2021-02-21 NOTE — Telephone Encounter (Signed)
error 

## 2021-02-26 ENCOUNTER — Other Ambulatory Visit: Payer: Self-pay

## 2021-02-26 ENCOUNTER — Other Ambulatory Visit (HOSPITAL_COMMUNITY)
Admission: RE | Admit: 2021-02-26 | Discharge: 2021-02-26 | Disposition: A | Discharge status: Home / Self Care | Payer: BC Managed Care – PPO | Source: Ambulatory Visit | Attending: Adult Health | Admitting: Adult Health

## 2021-02-26 ENCOUNTER — Ambulatory Visit (INDEPENDENT_AMBULATORY_CARE_PROVIDER_SITE_OTHER): Payer: BC Managed Care – PPO | Admitting: Student

## 2021-02-26 ENCOUNTER — Encounter: Payer: BC Managed Care – PPO | Admitting: Gastroenterology

## 2021-02-26 ENCOUNTER — Encounter: Payer: Self-pay | Admitting: Student

## 2021-02-26 ENCOUNTER — Ambulatory Visit (INDEPENDENT_AMBULATORY_CARE_PROVIDER_SITE_OTHER): Payer: BC Managed Care – PPO | Admitting: Adult Health

## 2021-02-26 ENCOUNTER — Encounter: Payer: Self-pay | Admitting: Adult Health

## 2021-02-26 VITALS — BP 114/60 | HR 88 | Ht 67.0 in | Wt 264.8 lb

## 2021-02-26 VITALS — BP 122/72 | HR 73 | Ht 67.0 in | Wt 264.0 lb

## 2021-02-26 DIAGNOSIS — G4733 Obstructive sleep apnea (adult) (pediatric): Secondary | ICD-10-CM

## 2021-02-26 DIAGNOSIS — I480 Paroxysmal atrial fibrillation: Secondary | ICD-10-CM | POA: Diagnosis not present

## 2021-02-26 DIAGNOSIS — Z01419 Encounter for gynecological examination (general) (routine) without abnormal findings: Secondary | ICD-10-CM

## 2021-02-26 DIAGNOSIS — I10 Essential (primary) hypertension: Secondary | ICD-10-CM | POA: Diagnosis not present

## 2021-02-26 DIAGNOSIS — I2510 Atherosclerotic heart disease of native coronary artery without angina pectoris: Secondary | ICD-10-CM

## 2021-02-26 NOTE — Progress Notes (Signed)
Patient ID: Becky [name], female   DOB: 07-Jun-1960, 61 y.o.   MRN: [id number] History of Present Illness: Becky [name] is a 61 year old black female, married, PM in for a well woman gyn exam and pap. She is still working. She is having back pain and get MRI tomorrow, sees Dr Lorin Mercy. She had hemorrhoid banding last week and is still bleeding, she is on xarelto for A fib. She is going to call Vicente Males about the bleeding.  PCP is Dr Gerarda Fraction.  Current Medications, Allergies, Past Medical History, Past Surgical History, Family History and Social History were reviewed in [place] record.     Review of Systems: Patient denies any headaches, hearing loss, fatigue, blurred vision, shortness of breath, chest pain, abdominal pain, problems with bowel movements, urination, or intercourse. No joint pain or mood swings.  Denies any vaginal bleeding See HPI for positives.   Physical Exam:BP 122/72 (BP Location: Left Arm, Patient Position: Sitting, Cuff Size: Large)    Pulse 73    Ht 5\' 7"  (1.702 m)    Wt 264 lb (119.7 kg)    BMI 41.35 kg/m   General:  Well developed, well nourished, no acute distress Skin:  Warm and dry Neck:  Midline trachea, normal thyroid, good ROM, no lymphadenopathy Lungs; Clear to auscultation bilaterally Breast:  No dominant palpable mass, retraction, or nipple discharge Cardiovascular: Regular rate and rhythm Abdomen:  Soft, non tender, no hepatosplenomegaly Pelvic:  External genitalia is normal in appearance, no lesions.  The vagina is normal in appearance. Urethra has no lesions or masses. The cervix is bulbous.Pap with HR HPV genotyping performed.  Uterus is felt to be normal size, shape, and contour.  No adnexal masses or tenderness noted.Bladder is non tender, no masses felt. Rectal: Deferred Extremities/musculoskeletal:  No swelling or varicosities noted, no clubbing or cyanosis Psych:  No mood changes, alert and cooperative,seems happy AA is 0 Fall risk  is low Depression screen [facility] 2/9 02/26/2021 12/21/2017  Decreased Interest 0 0  Down, Depressed, Hopeless 0 0  PHQ - 2 Score 0 0  Altered sleeping 2 -  Tired, decreased energy 1 -  Change in appetite 3 -  Feeling bad or failure about yourself  0 -  Trouble concentrating 0 -  Moving slowly or fidgety/restless 0 -  Suicidal thoughts 0 -  PHQ-9 Score 6 -  Some recent data might be hidden    GAD 7 : Generalized Anxiety Score 02/26/2021  Nervous, Anxious, on Edge 0  Control/stop worrying 0  Worry too much - different things 0  Trouble relaxing 0  Restless 0  Easily annoyed or irritable 0  Afraid - awful might happen 0  Total GAD 7 Score 0      Upstream - 02/26/21 1149       Pregnancy Intention Screening   Does the patient want to become pregnant in the next year? N/A    Does the patient's partner want to become pregnant in the next year? N/A    Would the patient like to discuss contraceptive options today? N/A      Contraception Wrap Up   Current Method Female Sterilization   postmenopausal   End Method Female Sterilization   postmenopausal   Contraception Counseling Provided No            Examination chaperoned by Levy Pupa LPN  Impression and Plan: 1. Encounter for gynecological examination with Papanicolaou smear of cervix Pap sent Physical in 1 year Pap in  3 years of normal Mammogram yearly Colonoscopy per GI Labs with PCP Review handouts on fatty liver and diet
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
## 2021-02-26 NOTE — Patient Instructions (Signed)
Medication Instructions:  Your physician recommends that you continue on your current medications as directed. Please refer to the Current Medication list given to you today.  *If you need a refill on your cardiac medications before your next appointment, please call your pharmacy*   Lab Work: NONE   If you have labs (blood work) drawn today and your tests are completely normal, you will receive your results only by: . MyChart Message (if you have MyChart) OR . A paper copy in the mail If you have any lab test that is abnormal or we need to change your treatment, we will call you to review the results.   Testing/Procedures: NONE    Follow-Up: At [facility], you and your health needs are our priority.  As part of our continuing mission to provide you with exceptional heart care, we have created designated Provider Care Teams.  These Care Teams include your primary Cardiologist (physician) and Advanced Practice Providers (APPs -  Physician Assistants and Nurse Practitioners) who all work together to provide you with the care you need, when you need it.  We recommend signing up for the patient portal called "MyChart".  Sign up information is provided on this After Visit Summary.  MyChart is used to connect with patients for Virtual Visits (Telemedicine).  Patients are able to view lab/test results, encounter notes, upcoming appointments, etc.  Non-urgent messages can be sent to your provider as well.   To learn more about what you can do with MyChart, go to [email or web address].    Your next appointment:   6 month(s)  The format for your next appointment:   In Person  Provider:   Jonathan Branch, MD   Other Instructions Thank you for choosing [facility]!    

## 2021-02-27 ENCOUNTER — Ambulatory Visit (HOSPITAL_COMMUNITY)
Admission: RE | Admit: 2021-02-27 | Discharge: 2021-02-27 | Disposition: A | Discharge status: Home / Self Care | Payer: BC Managed Care – PPO | Source: Ambulatory Visit | Attending: Orthopaedic Surgery | Admitting: Orthopaedic Surgery

## 2021-02-27 ENCOUNTER — Telehealth: Payer: Self-pay

## 2021-02-27 ENCOUNTER — Telehealth: Payer: Self-pay | Admitting: *Deleted

## 2021-02-27 DIAGNOSIS — G8929 Other chronic pain: Secondary | ICD-10-CM | POA: Diagnosis not present

## 2021-02-27 DIAGNOSIS — M545 Low back pain, unspecified: Secondary | ICD-10-CM | POA: Diagnosis not present

## 2021-02-27 DIAGNOSIS — M4316 Spondylolisthesis, lumbar region: Secondary | ICD-10-CM | POA: Diagnosis not present

## 2021-02-27 NOTE — Telephone Encounter (Signed)
Per Bernerd Pho, called to arrange [facility] consult.  Left message to call back.

## 2021-02-27 NOTE — Telephone Encounter (Signed)
Message sent to Amy L,NP by Bernerd Pho, PA 02/26/21 at [facility]: "Hi Amy, I saw this mutual patient in clinic today and she mentioned she has been unable to use her CPAP as it continues to cause sinus infections. Reports cleaning the machine routinely and has replaced the tubes but continues to have infections after use. I was not sure if someone from your sleep clinic could reach out to her with any new recommendations or check her machine? She is scheduled to see you in 06/2021 but has atrial fibrillation so I was hoping compliance with her CPAP would help with that as well. Thanks for your help!"  Received the following message from AL,NP:  "Can you guys please reach out to patient and make sure she is cleaning machine as directed by DME. She may consider visit with DME to review cleaning recommendations. I advised consideration of allergy medications ( antihistamines and nasal steroids) if PCP feels it is appropriate. May consider visit with ENT for eval if symptoms continue. CPAP does not usually cause sinus infections. "  I LVM for pt to call office back to discuss.

## 2021-02-28 NOTE — Telephone Encounter (Signed)
Left message to call back  

## 2021-03-03 ENCOUNTER — Other Ambulatory Visit: Payer: Self-pay | Admitting: Adult Health

## 2021-03-04 ENCOUNTER — Encounter: Payer: Self-pay | Admitting: Adult Health

## 2021-03-04 DIAGNOSIS — R87612 Low grade squamous intraepithelial lesion on cytologic smear of cervix (LGSIL): Secondary | ICD-10-CM | POA: Insufficient documentation

## 2021-03-04 HISTORY — DX: Low grade squamous intraepithelial lesion on cytologic smear of cervix (LGSIL): R87.612

## 2021-03-04 LAB — CYTOLOGY - PAP
Chlamydia: NEGATIVE
Comment: NEGATIVE
Comment: NEGATIVE
Comment: NEGATIVE
Comment: NORMAL
HPV 16: POSITIVE — AB
HPV 18 / 45: NEGATIVE
High risk HPV: POSITIVE — AB
Neisseria Gonorrhea: NEGATIVE

## 2021-03-05 DIAGNOSIS — G4733 Obstructive sleep apnea (adult) (pediatric): Secondary | ICD-10-CM | POA: Diagnosis not present

## 2021-03-05 NOTE — Telephone Encounter (Signed)
The patient called back. Becky [name] scheduled her for consult with Dr. Quentin Ore 03/12/2021.

## 2021-03-07 ENCOUNTER — Telehealth: Payer: Self-pay | Admitting: Obstetrics & Gynecology

## 2021-03-07 ENCOUNTER — Encounter: Payer: BC Managed Care – PPO | Admitting: Gastroenterology

## 2021-03-07 NOTE — Telephone Encounter (Signed)
Patient called stating that she had a miss call from [place] regarding her pap results. Patient would like a call back from the provider because she needs to know what is going on. Please contact pt ?

## 2021-03-07 NOTE — Telephone Encounter (Signed)
Called patient- left message that I was calling to review pap results.  Will try again later or pt can call back to office ?

## 2021-03-08 ENCOUNTER — Telehealth: Payer: Self-pay | Admitting: Adult Health

## 2021-03-08 NOTE — Telephone Encounter (Signed)
Pt aware of pap,and has colpo appt, she has not had HPV on pap before and has no new sex partner. Will know more after colpo ?

## 2021-03-08 NOTE — Telephone Encounter (Signed)
Patient called this morning wanting to know if you will call her at [phone]. She will be at home until 2:30 pm today. Patients states that she doesn't understand results.  ?

## 2021-03-12 ENCOUNTER — Encounter: Payer: Self-pay | Admitting: *Deleted

## 2021-03-12 ENCOUNTER — Other Ambulatory Visit: Payer: Self-pay

## 2021-03-12 ENCOUNTER — Ambulatory Visit (INDEPENDENT_AMBULATORY_CARE_PROVIDER_SITE_OTHER): Payer: BC Managed Care – PPO | Admitting: Cardiology

## 2021-03-12 ENCOUNTER — Other Ambulatory Visit: Payer: Self-pay | Admitting: Cardiology

## 2021-03-12 ENCOUNTER — Other Ambulatory Visit: Payer: Self-pay | Admitting: Student

## 2021-03-12 ENCOUNTER — Encounter: Payer: Self-pay | Admitting: Cardiology

## 2021-03-12 VITALS — BP 130/70 | HR 71 | Ht 67.0 in | Wt 262.2 lb

## 2021-03-12 DIAGNOSIS — I2510 Atherosclerotic heart disease of native coronary artery without angina pectoris: Secondary | ICD-10-CM

## 2021-03-12 DIAGNOSIS — I480 Paroxysmal atrial fibrillation: Secondary | ICD-10-CM

## 2021-03-12 DIAGNOSIS — I10 Essential (primary) hypertension: Secondary | ICD-10-CM | POA: Diagnosis not present

## 2021-03-12 DIAGNOSIS — Z01818 Encounter for other preprocedural examination: Secondary | ICD-10-CM | POA: Diagnosis not present

## 2021-03-12 DIAGNOSIS — K922 Gastrointestinal hemorrhage, unspecified: Secondary | ICD-10-CM
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
## 2021-03-12 NOTE — Patient Instructions (Signed)
Medication Instructions:  Your physician recommends that you continue on your current medications as directed. Please refer to the Current Medication list given to you today. *If you need a refill on your cardiac medications before your next appointment, please call your pharmacy*  Lab Work: CBC If you have labs (blood work) drawn today and your tests are completely normal, you will receive your results only by: [place] (if you have MyChart) OR A paper copy in the mail If you have any lab test that is abnormal or we need to change your treatment, we will call you to review the results.  Testing/Procedures: Your physician has requested that you have an echocardiogram. Echocardiography is a painless test that uses sound waves to create images of your heart. It provides your doctor with information about the size and shape of your heart and how well your hearts chambers and valves are working. This procedure takes approximately one hour. There are no restrictions for this procedure.   Your physician has requested that you have cardiac CT. Cardiac computed tomography (CT) is a painless test that uses an x-ray machine to take clear, detailed pictures of your heart. For further information please visit [email or web address]. Please follow instruction sheet as given.  Follow-Up: At [facility], you and your health needs are our priority.  As part of our continuing mission to provide you with exceptional heart care, we have created designated Provider Care Teams.  These Care Teams include your primary Cardiologist (physician) and Advanced Practice Providers (APPs -  Physician Assistants and Nurse Practitioners) who all work together to provide you with the care you need, when you need it.  Your physician wants you to follow-up in: Lenice Llamas, the [facility] Nurse Navigator, will call you after your CT once the [facility] Team has reviewed your imaging for an update on proceedings. [place]'s direct  number is [phone] if you need assistance.  We recommend signing up for the patient portal called "MyChart".  Sign up information is provided on this After Visit Summary.  MyChart is used to connect with patients for Virtual Visits (Telemedicine).  Patients are able to view lab/test results, encounter notes, upcoming appointments, etc.  Non-urgent messages can be sent to your provider as well.   To learn more about what you can do with MyChart, go to [email or web address].    Any Other Special Instructions Will Be Listed Below (If Applicable).  Left Atrial Appendage Closure Device Implantation Left atrial appendage (LAA) closure device implantation is a procedure to put a small device in the LAA of the heart. The LAA is a small sac in the wall of the heart's left upper chamber. Blood clots can form in the LAA in people with atrial fibrillation (AFib). The device closes the LAA to help prevent a blood clot and stroke. AFib is a type of irregular or rapid heartbeat (arrhythmia). There is an increased risk of blood clots and stroke with AFib. This procedure helps to reduce that risk. Tell a health care provider about: Any allergies you have. All medicines you are taking, including vitamins, herbs, eye drops, creams, and over-the-counter medicines. Any problems you or family members have had with anesthetic medicines. Any blood disorders you have. Any surgeries you have had. Any medical conditions you have. Whether you are pregnant or may be pregnant. What are the risks? Generally, this is a safe procedure. However, problems may occur, including: Infection. Bleeding. Allergic reactions to medicines or dyes. Damage to nearby structures or organs. Heart attack. Stroke.  Blood clots. Changes in heart rhythm. Device failure. What happens before the procedure? Staying hydrated Follow instructions from your health care provider about hydration, which may include: Up to 2 hours before the  procedure - you may continue to drink clear liquids, such as water, clear fruit juice, black coffee, and plain tea. Eating and drinking restrictions Follow instructions from your health care provider about eating and drinking, which may include: 8 hours before the procedure - stop eating heavy meals or foods, such as meat, fried foods, or fatty foods. 6 hours before the procedure - stop eating light meals or foods, such as toast or cereal. 6 hours before the procedure - stop drinking milk or drinks that contain milk. 2 hours before the procedure - stop drinking clear liquids. Medicines Ask your health care provider about: Changing or stopping your regular medicines. This is especially important if you are taking diabetes medicines or blood thinners. Taking medicines such as aspirin and ibuprofen. These medicines can thin your blood. Do not take these medicines unless your health care provider tells you to take them. Taking over-the-counter medicines, vitamins, herbs, and supplements. Tests You may have blood tests and a physical exam. You may have an electrocardiogram (ECG). This test checks your heart's electrical patterns and rhythms. General instructions Do not use any products that contain nicotine or tobacco. These include cigarettes, chewing tobacco, and vaping devices, such as e-cigarettes. If you need help quitting, ask your health care provider. Ask your health care provider what steps will be taken to help prevent infection. These steps may include: Removing hair at the surgery site. Washing skin with a germ-killing soap. Taking antibiotic medicine. Plan to have a responsible adult take you home from the hospital or clinic. Plan to have a responsible adult care for you for the time you are told after you leave the hospital or clinic. This is important. What happens during the procedure? An IV will be inserted into one of your veins. You will be given one or more of the  following: A medicine to help you relax (sedative). A medicine to make you fall asleep (general anesthetic). A small incision will be made in your groin area. A small wire will be put through the incision and into a blood vessel. Dye may be injected so X-rays can be used to guide the wire through the blood vessel. A long, thin tube (catheter) will be put over the small wire and moved up through the blood vessel to reach your heart. The closure device will be moved through the catheter until it reaches your heart. A small hole will be made in the septum (transseptal puncture). The septum is a thin tissue that separates the upper two chambers of the heart. The device will be placed so that it closes the LAA. X-rays will be done to make sure the device is in the right place. The catheter and wire will be removed. The closure device will remain in your heart. After pressure is applied over the catheter site to prevent bleeding, a bandage (dressing) will be placed over the site where the catheter was inserted. The procedure may vary among health care providers and hospitals. What happens after the procedure? Your blood pressure, heart rate, breathing rate, and blood oxygen level will be monitored until you leave the hospital or clinic. You may have to wear compression stockings. These stockings help to prevent blood clots and reduce swelling in your legs. If you were given a sedative during the procedure,  it can affect you for several hours. Do not drive or operate machinery until your health care provider says it is safe. You may be given pain medicine. You may need to drink more fluids to wash (flush) the dye out of your body. Drink enough fluid to keep your urine pale yellow. Take over-the-counter and prescription medicines only as told by your health care provider. This is especially important if you were given blood thinners. Summary Left atrial appendage (LAA) closure device implantation is a  procedure that is done to put a small device in the LAA of the heart. The LAA is a small sac in the wall of the heart's left upper chamber. The device closes the LAA to prevent stroke and other problems. Follow instructions from your health care provider before and after the procedure. This information is not intended to replace advice given to you by your health care provider. Make sure you discuss any questions you have with your health care provider. Document Revised: 09/01/2019 Document Reviewed: 09/01/2019 Elsevier Patient Education  [place].

## 2021-03-13 LAB — BASIC METABOLIC PANEL
BUN/Creatinine Ratio: 14 (ref 9–23)
BUN: 14 mg/dL (ref 6–24)
CO2: 23 mmol/L (ref 20–29)
Calcium: 9.9 mg/dL (ref 8.7–10.2)
Chloride: 103 mmol/L (ref 96–106)
Creatinine, Ser: 1.02 mg/dL — ABNORMAL HIGH (ref 0.57–1.00)
Glucose: 134 mg/dL — ABNORMAL HIGH (ref 70–99)
Potassium: 4 mmol/L (ref 3.5–5.2)
Sodium: 143 mmol/L (ref 134–144)
eGFR: 63 mL/min/{1.73_m2} (ref >59)

## 2021-03-14 ENCOUNTER — Ambulatory Visit (INDEPENDENT_AMBULATORY_CARE_PROVIDER_SITE_OTHER): Payer: BC Managed Care – PPO | Admitting: Gastroenterology

## 2021-03-14 ENCOUNTER — Other Ambulatory Visit: Payer: Self-pay

## 2021-03-14 ENCOUNTER — Other Ambulatory Visit: Payer: Self-pay | Admitting: Gastroenterology

## 2021-03-14 ENCOUNTER — Encounter: Payer: Self-pay | Admitting: Gastroenterology

## 2021-03-14 VITALS — BP 120/78 | HR 57 | Temp 100.0°F | Ht 67.0 in | Wt 263.4 lb

## 2021-03-14 DIAGNOSIS — K641 Second degree hemorrhoids: Secondary | ICD-10-CM

## 2021-03-14 MED ORDER — LUBIPROSTONE 24 MCG PO CAPS: 24.0000 ug | ORAL_CAPSULE | Freq: Two times a day (BID) | ORAL | 3 refills | Status: DC

## 2021-03-14 NOTE — Telephone Encounter (Signed)
Last office visit 03/14/21 ?

## 2021-03-14 NOTE — Progress Notes (Signed)
? ? ?  [place] BANDING PROCEDURE NOTE ? ?Becky [name] is a 61 y.o. female presenting today for consideration of hemorrhoid banding. Last colonoscopy July 2022 while inpatient for GI bleeding with diverticulosis and adenomas. 5 year surveillance recommended. Felt bleeding most likely from hemorrhoids and less likely diverticular bleeding. She is on Xarelto for afib. She has had banding neutrally and right anterior. Taking Amitiza 8 mcg, just bumped up to BID. Been taking BID but still not ideal. Rectal bleeding has improved some.  ? ? ?The patient presents with symptomatic grade 2 hemorrhoids, unresponsive to maximal medical therapy, requesting rubber band ligation of her hemorrhoidal disease. All risks, benefits, and alternative forms of therapy were described and informed consent was obtained. ? ? ?The decision was made to band the left lateral internal hemorrhoid using latex-free bands, and the [place] was used to perform band ligation without complication. Digital anorectal examination was then performed to assure proper positioning of the band, and to adjust the banded tissue as required. The patient was discharged home without pain or other issues. Dietary and behavioral recommendations were given, along with follow-up instructions. The patient will return in several weeks for followup and possible additional banding as required. I have increased Amitiza to 24 mcg po BID.  ? ?No complications were encountered and the patient tolerated the procedure well.  ? ?[name], PhD, ANP-BC ?[facility]  ? ?

## 2021-03-14 NOTE — Patient Instructions (Signed)
We will see you in a few weeks for possible repeat banding. ? ?Please message me if needed on MyChart! ? ?I enjoyed seeing you again today! As you know, I value our relationship and want to provide genuine, compassionate, and quality care. I welcome your feedback. If you receive a survey regarding your visit,  I greatly appreciate you taking time to fill this out. See you next time! ? ?[name], PhD, ANP-BC ?[facility]  ? ?

## 2021-03-15 ENCOUNTER — Telehealth: Payer: Self-pay

## 2021-03-18 ENCOUNTER — Other Ambulatory Visit: Payer: Self-pay | Admitting: Gastroenterology

## 2021-03-18 NOTE — Telephone Encounter (Signed)
FYI ?Vicente Males, ? ?Phoned the pt the phone just rang. (Pt works during these hours). Sent a MyChart message asking her what she wanted to do regarding this Rx. Waiting on he pt to call me back ?

## 2021-03-18 NOTE — Telephone Encounter (Signed)
Do I need to do anything ?

## 2021-03-18 NOTE — Telephone Encounter (Signed)
This pt does not have Rx insurance. That's why she didn't get it last time. We tried a few weeks ago. When she came up here I spoke with her ?

## 2021-03-19 ENCOUNTER — Telehealth: Payer: Self-pay | Admitting: Internal Medicine

## 2021-03-19 NOTE — Telephone Encounter (Signed)
Sent the pt a MyChart message advises the Rx is already at the pharmacy ?

## 2021-03-19 NOTE — Telephone Encounter (Signed)
Pt called to let nurse know to go ahead and call in her Amitiza '24mg'$  to [facility] in [place] ?

## 2021-03-21 ENCOUNTER — Other Ambulatory Visit: Payer: Self-pay

## 2021-03-21 ENCOUNTER — Telehealth: Payer: Self-pay | Admitting: Cardiology

## 2021-03-21 ENCOUNTER — Ambulatory Visit (INDEPENDENT_AMBULATORY_CARE_PROVIDER_SITE_OTHER): Payer: BC Managed Care – PPO | Admitting: Obstetrics & Gynecology

## 2021-03-21 ENCOUNTER — Encounter: Payer: Self-pay | Admitting: Obstetrics & Gynecology

## 2021-03-21 ENCOUNTER — Other Ambulatory Visit (HOSPITAL_COMMUNITY)
Admission: RE | Admit: 2021-03-21 | Discharge: 2021-03-21 | Disposition: A | Discharge status: Home / Self Care | Payer: BC Managed Care – PPO | Source: Ambulatory Visit | Attending: Obstetrics & Gynecology | Admitting: Obstetrics & Gynecology

## 2021-03-21 VITALS — BP 156/86 | HR 82 | Ht 67.0 in | Wt 266.6 lb

## 2021-03-21 DIAGNOSIS — N72 Inflammatory disease of cervix uteri: Secondary | ICD-10-CM | POA: Diagnosis not present

## 2021-03-21 DIAGNOSIS — B977 Papillomavirus as the cause of diseases classified elsewhere: Secondary | ICD-10-CM | POA: Insufficient documentation

## 2021-03-21 DIAGNOSIS — R87612 Low grade squamous intraepithelial lesion on cytologic smear of cervix (LGSIL): Secondary | ICD-10-CM | POA: Insufficient documentation

## 2021-03-21 DIAGNOSIS — Z0279 Encounter for issue of other medical certificate: Secondary | ICD-10-CM

## 2021-03-21 DIAGNOSIS — N870 Mild cervical dysplasia: Secondary | ICD-10-CM | POA: Diagnosis not present

## 2021-03-21 NOTE — Telephone Encounter (Signed)
Forms from [employer] [employer] received on 03/21/2021. Completed patient authorization attached. Took forms to MD box for completion.  ?03/21/2021 [facility]  ?

## 2021-03-21 NOTE — Progress Notes (Signed)
? ? ?  Patient name: Becky [name] MRN [id number]  Date of birth: 18-Apr-1960 ?Chief Complaint:   ?Colposcopy (LSIL pap; + HPV 16; + HR HPV) ? ?History of Present Illness:   ?Carolee Channell is a 61 y.o. [phone] PM female being seen today for cervical dysplasia management. ? ?Smoker:  No. ?New sexual partner:  No.  ? ?History of abnormal Pap: no ? ?No LMP recorded. Patient is postmenopausal. ? ?Depression screen [facility] 2/9 02/26/2021  ?Decreased Interest 0  ?Down, Depressed, Hopeless 0  ?PHQ - 2 Score 0  ?Altered sleeping 2  ?Tired, decreased energy 1  ?Change in appetite 3  ?Feeling bad or failure about yourself  0  ?Trouble concentrating 0  ?Moving slowly or fidgety/restless 0  ?Suicidal thoughts 0  ?PHQ-9 Score 6  ?Some recent data might be hidden  ? ? ? ?Review of Systems:   ?Pertinent items are noted in HPI ?Denies fever/chills, dizziness, headaches, visual disturbances, fatigue, shortness of breath, chest pain, abdominal pain, vomiting, bowel movements, urination, or intercourse unless otherwise stated above.  ?Pertinent History Reviewed:  ?Reviewed past medical,surgical, social, obstetrical and family history.  ?Reviewed problem list, medications and allergies. ?Physical Assessment:  ? ?Vitals:  ? 03/21/21 1125  ?BP: (!) 156/86  ?Pulse: 82  ?Weight: 266 lb 9.6 oz (120.9 kg)  ?Height: '5\' 7"'$  (1.702 m)  ?Body mass index is 41.76 kg/m?. ? ?     Physical Examination:  ? General appearance: alert, well appearing, and in no distress ? Psych: mood appropriate, normal affect ? Skin: warm & dry  ? Cardiovascular: normal heart rate noted ? Respiratory: normal respiratory effort, no distress ? Abdomen: soft, non-tender  ? Pelvic: VULVA: normal appearing vulva with no masses, tenderness or lesions, VAGINA: normal appearing vagina with normal color and discharge, no lesions, CERVIX: normal appearing cervix without discharge or lesions ? Extremities: no edema  ? ?Chaperone: Levy Pupa   ? ? ?Colposcopy Procedure  Note ? ?Indications: LSIL, HPV 16+ ? ?Procedure Details  ?The risks and benefits of the procedure and Written informed consent obtained. ? ?Speculum placed in vagina and excellent visualization of cervix achieved, cervix swabbed x 3 with acetic acid solution. ? ?Findings: ?Adequate colposcopy is noted today.  TMZ zone not well seen- suspect involuted within canal ? ?Cervix: no visible lesions; ECC and cervical biopsies obtained.   ? ?Monsel's applied.  Adequate hemostasis noted ? ?Specimens: ECC and cervical biopsies x 2 ? ?Complications: none. ? ?Colposcopic Impression: benign ? ? ?Plan(Based on 2019 ASCCP recommendations) ? ?-Discussed HPV- reviewed incidence and its potential to cause condylomas to dysplasia to cervical cancer ?-Reviewed degree of abnormal pap smears  ?-Discussed ASCCP guidelines and current recommendations for colposcopy ?-As above, inform consent obtained and procedure completed ?-biopsies obtained, further management pending results ?-Questions and concerns were addressed ? ?Janyth Pupa, DO ?Attending [facility] ?[facility] ? ? ?  ? ?

## 2021-03-21 NOTE — Addendum Note (Signed)
Addended by: Linton Rump on: 03/21/2021 12:34 PM ? ? Modules accepted: Orders ? ?

## 2021-03-22 LAB — SURGICAL PATHOLOGY

## 2021-03-22 NOTE — Telephone Encounter (Signed)
Phoned and spoke with the pharmacist and advised the pt still wanted her Rx and she has had several ov's. They re filling and I have contacted the pt through [place] ?

## 2021-03-24 ENCOUNTER — Encounter: Payer: Self-pay | Admitting: Obstetrics & Gynecology

## 2021-03-25 ENCOUNTER — Other Ambulatory Visit: Payer: Self-pay | Admitting: Gastroenterology

## 2021-03-25 NOTE — Telephone Encounter (Signed)
noted 

## 2021-03-28 ENCOUNTER — Telehealth (HOSPITAL_COMMUNITY): Payer: Self-pay | Admitting: *Deleted

## 2021-03-28 NOTE — Telephone Encounter (Signed)
Reaching out to patient to offer assistance regarding upcoming cardiac imaging study; pt verbalizes understanding of appt date/time, parking situation and where to check in, pre-test NPO status, and verified current allergies; name and call back number provided for further questions should they arise ? ?Gordy Clement RN Navigator Cardiac Imaging ?[facility] ?[phone] office ?[phone] cell ? ?Patient to take her AM dose of metoprolol tartrate two hours prior to her cardiac CT scan.  She is aware to arrive at 10am for her  10:30am scan. ?

## 2021-04-01 ENCOUNTER — Ambulatory Visit (HOSPITAL_COMMUNITY)
Admission: RE | Admit: 2021-04-01 | Discharge: 2021-04-01 | Disposition: A | Discharge status: Home / Self Care | Payer: BC Managed Care – PPO | Source: Ambulatory Visit | Attending: Cardiology | Admitting: Cardiology

## 2021-04-01 ENCOUNTER — Ambulatory Visit: Payer: BC Managed Care – PPO | Admitting: Physician Assistant

## 2021-04-01 ENCOUNTER — Other Ambulatory Visit: Payer: Self-pay

## 2021-04-01 ENCOUNTER — Ambulatory Visit (HOSPITAL_BASED_OUTPATIENT_CLINIC_OR_DEPARTMENT_OTHER): Payer: BC Managed Care – PPO

## 2021-04-01 DIAGNOSIS — I4891 Unspecified atrial fibrillation: Secondary | ICD-10-CM

## 2021-04-01 DIAGNOSIS — I480 Paroxysmal atrial fibrillation: Secondary | ICD-10-CM

## 2021-04-01 DIAGNOSIS — Z01818 Encounter for other preprocedural examination: Secondary | ICD-10-CM | POA: Insufficient documentation

## 2021-04-01 LAB — ECHOCARDIOGRAM COMPLETE
Area-P 1/2: 3.27 cm2
S' Lateral: 3.25 cm

## 2021-04-01 MED ORDER — IOHEXOL 350 MG/ML SOLN
100.0000 mL | Freq: Once | INTRAVENOUS | Status: AC | PRN
  Administered 2021-04-01: 100 mL via INTRAVENOUS

## 2021-04-03 DIAGNOSIS — G4733 Obstructive sleep apnea (adult) (pediatric): Secondary | ICD-10-CM | POA: Diagnosis not present

## 2021-04-04 ENCOUNTER — Telehealth: Payer: Self-pay | Admitting: Cardiology

## 2021-04-04 ENCOUNTER — Encounter: Payer: Self-pay | Admitting: Gastroenterology

## 2021-04-04 ENCOUNTER — Ambulatory Visit (INDEPENDENT_AMBULATORY_CARE_PROVIDER_SITE_OTHER): Payer: BC Managed Care – PPO | Admitting: Gastroenterology

## 2021-04-04 VITALS — BP 138/72 | HR 74 | Temp 97.8°F | Ht 67.0 in | Wt 268.2 lb

## 2021-04-04 DIAGNOSIS — K641 Second degree hemorrhoids: Secondary | ICD-10-CM | POA: Diagnosis not present

## 2021-04-04 NOTE — Patient Instructions (Signed)
We will see you in 6 months! ? ?Please call me with any concerns! ? ?I enjoyed seeing you again today! As you know, I value our relationship and want to provide genuine, compassionate, and quality care. I welcome your feedback. If you receive a survey regarding your visit,  I greatly appreciate you taking time to fill this out. See you next time! ? ?[name], PhD, ANP-BC ?[facility]  ? ?

## 2021-04-04 NOTE — Telephone Encounter (Signed)
Patient works from 230 pm till 12 am. ? ? ?She wants Dr.Branch to know she wants the same information he put on her FMLA last year on this years request. ?

## 2021-04-04 NOTE — Progress Notes (Signed)
? ? ?  [place] BANDING PROCEDURE NOTE ? ?Becky [name] is a 61 y.o. female presenting today for consideration of hemorrhoid banding. Last colonoscopy July 2022 while inpatient for GI bleeding with diverticulosis and adenomas. 5 year surveillance recommended. Felt bleeding most likely from hemorrhoids and less likely diverticular bleeding. She is on Xarelto for afib. She has had banding neutrally,  right anterior, and left laterally. She has had overall a very good response.  ? ? ?The patient presents with symptomatic grade 2 hemorrhoids, unresponsive to maximal medical therapy, requesting rubber band ligation of his/her hemorrhoidal disease. All risks, benefits, and alternative forms of therapy were described and informed consent was obtained. ? ?Insufficient tissue right posterior, band deployed but not positioned correctly. I elected to do second banding neutrally, which actually removed the initial band and captured excellent amount of tissue that appeared to be in right anterior.  ?The decision was made to band the right posterior internal hemorrhoid, but insufficient tissue was present. . I elected to band neutrally, capturing excellent amount of tissue that appeared to be in right anterior.  ?Digital anorectal examination was then performed to assure proper positioning of the band, and to adjust the banded tissue as required. The patient was discharged home without pain or other issues. Dietary and behavioral recommendations were given, along with follow-up instructions. The patient will return 6 months for routine follow-up. ? ?No complications were encountered and the patient tolerated the procedure well.  ? ?[name], PhD, ANP-BC ?[facility]  ? ?

## 2021-04-04 NOTE — Telephone Encounter (Signed)
Pt states she is returning a call to Dr. Harl Bowie in regard to [facility] paperwork... please advise  ?

## 2021-04-05 NOTE — Telephone Encounter (Signed)
Thanks, paperwork completed. I will give to front desk today ? ?Zandra Abts MD ?

## 2021-04-05 NOTE — Telephone Encounter (Signed)
Forms completed and I called patient Becky [name] to let her know her forms are complete and, ready for pickup on 04/05/2021. ?[place] 04/05/2021 ?

## 2021-04-08 ENCOUNTER — Telehealth: Payer: Self-pay

## 2021-04-08 NOTE — Telephone Encounter (Signed)
-----   Message from Vickie Epley, MD sent at 04/02/2021  1:28 PM EDT ----- ?OK to proceed with watchman evaluation. ?CL ? ?----- Message ----- ?From: Interface, Rad Results In ?Sent: 04/01/2021   2:44 PM EDT ?To: Vickie Epley, MD ? ? ?

## 2021-04-08 NOTE — Telephone Encounter (Signed)
Discussed pre-Watchman CT results with patient. She wishes to hold off on LAAO at this time. She had hemorrhoids banded and is having no bleeding.  ?She will keep follow-up as scheduled with Dr. Harl Bowie in September and will call back prior to that visit if bleeding recurs and she wishes to proceed with implant. ?She was grateful for call and agrees with plan.  ?

## 2021-05-02 ENCOUNTER — Encounter: Payer: Self-pay | Admitting: Gastroenterology

## 2021-05-02 ENCOUNTER — Ambulatory Visit (INDEPENDENT_AMBULATORY_CARE_PROVIDER_SITE_OTHER): Payer: BC Managed Care – PPO | Admitting: Gastroenterology

## 2021-05-02 VITALS — BP 124/76 | HR 70 | Temp 97.6°F | Ht 66.0 in | Wt 269.0 lb

## 2021-05-02 DIAGNOSIS — K642 Third degree hemorrhoids: Secondary | ICD-10-CM | POA: Diagnosis not present

## 2021-05-02 NOTE — Patient Instructions (Signed)
Avoid any straining. Limit toilet time to 2-3 minutes. ? ?Let's try Linzess 145 mcg daily. Let me know in a week how this is. ? ?We will see you back in 2 weeks for final banding! ? ?I enjoyed seeing you again today! As you know, I value our relationship and want to provide genuine, compassionate, and quality care. I welcome your feedback. If you receive a survey regarding your visit,  I greatly appreciate you taking time to fill this out. See you next time! ? ?[name], PhD, ANP-BC ?[facility]  ? ?

## 2021-05-02 NOTE — Progress Notes (Addendum)
? ? ?  [place] [facility] BANDING PROCEDURE NOTE ? ?Becky [name] is a 61 y.o. female presenting today for consideration of hemorrhoid banding. Last colonoscopy July 2022 while inpatient for GI bleeding with diverticulosis and adenomas. 5 year surveillance recommended. Felt bleeding most likely from hemorrhoids and less likely diverticular bleeding. She is on Xarelto for afib. She has had banding neutrally,  right anterior X2,  and left laterally. She admits to straining. Has had recurrent bleeding. Difficulty with constipation.  ? ? ?The patient presents with symptomatic grade 3 hemorrhoids, unresponsive to maximal medical therapy, requesting rubber band ligation of her hemorrhoidal disease. All risks, benefits, and alternative forms of therapy were described and informed consent was obtained. ? ?In the left lateral decubitus position, anoscopic examination revealed grade 3 hemorrhoids in the left lateral and right anterior position (s). ? ?The decision was made to band the left lateral internal hemorrhoid, and the [place] was used to perform band ligation without complication. Latex-free bands used. Digital anorectal examination was then performed to assure proper positioning of the band, and to adjust the banded tissue as required. The patient was discharged home without pain or other issues. Dietary and behavioral recommendations were given, along with follow-up instructions. The patient will return in several weeks for followup and possible additional banding as required. She will trial Linzess 145 mcg daily. If this is too strong, will back down to Linzess 72 mcg and add Miralax daily. We discussed at length avoidance of straining. We may have reached maximum banding effect at this time if continues to strain, as hemorrhoids will only recur.  ? ?No complications were encountered and the patient tolerated the procedure well.  ? ?[name], PhD, ANP-BC ?[facility]  ? ?

## 2021-05-04 DIAGNOSIS — G4733 Obstructive sleep apnea (adult) (pediatric): Secondary | ICD-10-CM | POA: Diagnosis not present

## 2021-05-09 ENCOUNTER — Other Ambulatory Visit: Payer: Self-pay | Admitting: Adult Health

## 2021-05-09 ENCOUNTER — Other Ambulatory Visit: Payer: Self-pay | Admitting: Gastroenterology

## 2021-05-09 ENCOUNTER — Other Ambulatory Visit: Payer: Self-pay | Admitting: Student

## 2021-05-09 NOTE — Telephone Encounter (Signed)
Prescription refill request for Xarelto received.  ?Indication:afib ?Last office visit:[place] 03/12/21 ?Weight:118.9kg ?Age:57f?Scr:1.02 03/12/21 ?CrCl:525mmin  ?

## 2021-05-09 NOTE — Telephone Encounter (Signed)
Is patient taking Linzess? I thought we were on that now.  ?

## 2021-05-10 NOTE — Telephone Encounter (Signed)
Pt returned call and advised me to hold off on Amitiza because she is going to do the Linzess 145 mcg you wanted her to try. She will lets [place] know how it works ?

## 2021-05-10 NOTE — Telephone Encounter (Signed)
Phoned and LMOVM of the pt to return call regarding which constipation med she is on. ?

## 2021-05-16 ENCOUNTER — Encounter: Payer: Self-pay | Admitting: Internal Medicine

## 2021-05-16 ENCOUNTER — Encounter: Payer: BC Managed Care – PPO | Admitting: Gastroenterology

## 2021-05-23 ENCOUNTER — Ambulatory Visit (INDEPENDENT_AMBULATORY_CARE_PROVIDER_SITE_OTHER): Payer: BC Managed Care – PPO | Admitting: Orthopaedic Surgery

## 2021-05-23 ENCOUNTER — Ambulatory Visit: Payer: Self-pay

## 2021-05-23 ENCOUNTER — Encounter: Payer: Self-pay | Admitting: Orthopaedic Surgery

## 2021-05-23 VITALS — Ht 67.0 in | Wt 265.0 lb

## 2021-05-23 DIAGNOSIS — M1712 Unilateral primary osteoarthritis, left knee: Secondary | ICD-10-CM | POA: Diagnosis not present

## 2021-05-23 DIAGNOSIS — G8929 Other chronic pain: Secondary | ICD-10-CM | POA: Diagnosis not present

## 2021-05-23 DIAGNOSIS — M25562 Pain in left knee: Secondary | ICD-10-CM

## 2021-05-23 DIAGNOSIS — Z6841 Body Mass Index (BMI) 40.0 and over, adult: Secondary | ICD-10-CM

## 2021-05-23 MED ORDER — LIDOCAINE HCL 1 % IJ SOLN
0.5000 mL | INTRAMUSCULAR | Status: AC | PRN
  Administered 2021-05-23: .5 mL

## 2021-05-23 MED ORDER — BUPIVACAINE HCL 0.5 % IJ SOLN
3.0000 mL | INTRAMUSCULAR | Status: AC | PRN
  Administered 2021-05-23: 3 mL via INTRA_ARTICULAR

## 2021-05-23 MED ORDER — METHYLPREDNISOLONE ACETATE 40 MG/ML IJ SUSP
40.0000 mg | INTRAMUSCULAR | Status: AC | PRN
  Administered 2021-05-23: 40 mg via INTRA_ARTICULAR
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
## 2021-06-03 ENCOUNTER — Other Ambulatory Visit: Payer: Self-pay | Admitting: Adult Health

## 2021-06-03 DIAGNOSIS — G4733 Obstructive sleep apnea (adult) (pediatric): Secondary | ICD-10-CM | POA: Diagnosis not present

## 2021-06-11 ENCOUNTER — Other Ambulatory Visit: Payer: Self-pay | Admitting: Gastroenterology

## 2021-06-12 ENCOUNTER — Other Ambulatory Visit: Payer: Self-pay | Admitting: Gastroenterology
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
## 2021-06-24 ENCOUNTER — Ambulatory Visit (INDEPENDENT_AMBULATORY_CARE_PROVIDER_SITE_OTHER): Payer: BC Managed Care – PPO | Admitting: Family Medicine

## 2021-06-24 ENCOUNTER — Encounter: Payer: Self-pay | Admitting: Family Medicine

## 2021-06-24 VITALS — BP 114/68 | HR 69 | Ht 67.0 in | Wt 271.0 lb

## 2021-06-24 DIAGNOSIS — Z9989 Dependence on other enabling machines and devices: Secondary | ICD-10-CM | POA: Diagnosis not present

## 2021-06-24 DIAGNOSIS — G4733 Obstructive sleep apnea (adult) (pediatric): Secondary | ICD-10-CM | POA: Diagnosis not present

## 2021-06-24 NOTE — Progress Notes (Signed)
Faxed supplies order for CPAP to [facility] at [phone]. Received fax confirmation.

## 2021-06-24 NOTE — Patient Instructions (Signed)
Please continue using your CPAP regularly. While your insurance requires that you use CPAP at least 4 hours each night on 70% of the nights, I recommend, that you not skip any nights and use it throughout the night if you can. Getting used to CPAP and staying with the treatment long term does take time and patience and discipline. Untreated obstructive sleep apnea when it is moderate to severe can have an adverse impact on cardiovascular health and raise her risk for heart disease, arrhythmias, hypertension, congestive heart failure, stroke and diabetes. Untreated obstructive sleep apnea causes sleep disruption, nonrestorative sleep, and sleep deprivation. This can have an impact on your day to day functioning and cause daytime sleepiness and impairment of cognitive function, memory loss, mood disturbance, and problems focussing. Using CPAP regularly can improve these symptoms.  Keep focusing on using CPAP every night.   Follow up in 6 months

## 2021-07-02 DIAGNOSIS — G4733 Obstructive sleep apnea (adult) (pediatric): Secondary | ICD-10-CM | POA: Diagnosis not present

## 2021-07-04 ENCOUNTER — Encounter: Payer: Self-pay | Admitting: Gastroenterology

## 2021-07-04 ENCOUNTER — Ambulatory Visit (INDEPENDENT_AMBULATORY_CARE_PROVIDER_SITE_OTHER): Payer: BC Managed Care – PPO | Admitting: Gastroenterology

## 2021-07-04 VITALS — BP 123/77 | HR 76 | Temp 97.6°F | Ht 67.0 in | Wt 274.2 lb

## 2021-07-04 DIAGNOSIS — K642 Third degree hemorrhoids: Secondary | ICD-10-CM

## 2021-07-04 DIAGNOSIS — G4733 Obstructive sleep apnea (adult) (pediatric): Secondary | ICD-10-CM | POA: Diagnosis not present

## 2021-07-04 MED ORDER — DEXLANSOPRAZOLE 60 MG PO CPDR: 60.0000 mg | DELAYED_RELEASE_CAPSULE | Freq: Every day | ORAL | 3 refills | Status: DC

## 2021-07-04 NOTE — Progress Notes (Signed)
    [facility] BANDING PROCEDURE NOTE  Becky [name] is a 61 y.o. female presenting today for consideration of hemorrhoid banding. Last colonoscopy July 2022 while inpatient for GI bleeding with diverticulosis and adenomas. 5 year surveillance recommended. She is on Xarelto for afib. She has had banding neutrally,  right anterior X2,  and left laterally. At last visit, anoscopy revealed grade 3 hemorrhoids in left lateral and right anterior position. Left lateral  banded.   The patient presents with symptomatic grade 2 hemorrhoids, unresponsive to maximal medical therapy, requesting rubber band ligation of his/her hemorrhoidal disease. All risks, benefits, and alternative forms of therapy were described and informed consent was obtained.  The decision was made to band the right anterior internal hemorrhoid, and the [place] was used to perform band ligation without complication. Digital anorectal examination was then performed to assure proper positioning of the band, and to adjust the banded tissue as required. The patient was discharged home without pain or other issues. Dietary and behavioral recommendations were given, along with follow-up instructions. The patient will return in 6 months for followup. She is doing much better on Linzess 145 mcg daily with Miralax prn. Protonix not as effective. Will send in [place]. May add Pepcid daily as needed.   No complications were encountered and the patient tolerated the procedure well.   [name], PhD, ANP-BC [facility]

## 2021-07-04 NOTE — Patient Instructions (Signed)
Please continue to avoid straining.  You should limit your toilet time to 2-3 minutes at the most.   Continue to avoid constipation.  Please call me with any concerns or issues!  I will see you in 6 months!  I have sent in [place] to see if this is covered better. You can take Pepcid daily as needed (this is over-the-counter) in addition to the pantoprazole you are already taking!  I enjoyed seeing you again today! As you know, I value our relationship and want to provide genuine, compassionate, and quality care. I welcome your feedback. If you receive a survey regarding your visit,  I greatly appreciate you taking time to fill this out. See you next time!  [name], PhD, ANP-BC [facility]

## 2021-07-13 ENCOUNTER — Emergency Department (HOSPITAL_COMMUNITY)
Admission: EM | Admit: 2021-07-13 | Discharge: 2021-07-13 | Disposition: A | Discharge status: Home / Self Care | Payer: BC Managed Care – PPO | Attending: Emergency Medicine | Admitting: Emergency Medicine

## 2021-07-13 ENCOUNTER — Encounter (HOSPITAL_COMMUNITY): Payer: Self-pay | Admitting: *Deleted

## 2021-07-13 ENCOUNTER — Other Ambulatory Visit: Payer: Self-pay

## 2021-07-13 DIAGNOSIS — Z7901 Long term (current) use of anticoagulants: Secondary | ICD-10-CM | POA: Insufficient documentation

## 2021-07-13 DIAGNOSIS — M25561 Pain in right knee: Secondary | ICD-10-CM | POA: Insufficient documentation

## 2021-07-13 DIAGNOSIS — Z91040 Latex allergy status: Secondary | ICD-10-CM | POA: Insufficient documentation

## 2021-07-13 DIAGNOSIS — M545 Low back pain, unspecified: Secondary | ICD-10-CM | POA: Insufficient documentation

## 2021-07-13 MED ORDER — OXYCODONE HCL 5 MG PO TABS
10.0000 mg | ORAL_TABLET | Freq: Once | ORAL | Status: AC
  Administered 2021-07-13: 10 mg via ORAL
  Filled 2021-07-13: qty 2

## 2021-07-13 NOTE — ED Notes (Signed)
ED Provider at bedside. 

## 2021-07-13 NOTE — ED Triage Notes (Signed)
Pt with lower back and right knee pain.denies any injury or falls.

## 2021-07-13 NOTE — Discharge Instructions (Signed)
I would like for you to follow-up with your primary care doctor and/or orthopedist for further evaluation.  It sounds like the Tylenol that you have been taking is not providing much relief.  As we discussed, the complication is you taking Xarelto.  I would advise against skipping doses of this.  It might be worth talking with your doctor about getting on something like tramadol which is a very mild narcotic pain medication.  Please return to the emergency department for any worsening symptoms you might have.

## 2021-07-13 NOTE — ED Provider Notes (Signed)
[facility] EMERGENCY DEPARTMENT Provider Note   CSN: [id number] Arrival date & time: 07/13/21  1753     History Chief Complaint  Patient presents with   Back Pain    Becky [name] is a 61 y.o. female patient who presents to the emergency department for further evaluation of right knee pain.  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  Patient had PRP injections in the right knee 3 weeks ago.  Over the last couple of weeks she has been noticing some increased amount of pain.  She denies any new injury or trauma to the right knee.  No focal weakness or numbness to the right knee.  She is been taking Tylenol over-the-counter with little relief.  Patient here for pain relief.   Back Pain      Home Medications Prior to Admission medications   Medication Sig Start Date End Date Taking? Authorizing Provider  acetaminophen (TYLENOL) 500 MG tablet Take 1,000 mg by mouth every 8 (eight) hours as needed for moderate pain or headache.    [provider]  budesonide (RHINOCORT AQUA) 32 MCG/ACT nasal spray Place 1 spray into both nostrils daily as needed for rhinitis.    [provider]  dexlansoprazole (DEXILANT) 60 MG capsule Take 1 capsule (60 mg total) by mouth daily. 07/04/21   [name], NP  diltiazem (CARDIZEM CD) 360 MG 24 hr capsule TAKE 1 CAPSULE BY MOUTH EVERY DAY 08/16/20   Verta Ellen., NP  EPINEPHrine 0.3 mg/0.3 mL IJ SOAJ injection Inject 0.3 mg into the muscle once as needed for anaphylaxis. 04/20/18   [provider]  fluticasone (FLONASE) 50 MCG/ACT nasal spray Place 2 sprays into both nostrils daily. 02/02/20   Melynda Ripple, MD  furosemide (LASIX) 40 MG tablet Take 1 tablet (40 mg total) by mouth daily as needed. Patient taking differently: Take 40 mg by mouth daily as needed for fluid. 07/19/19   Arnoldo Lenis, MD  lubiprostone (AMITIZA) 24 MCG capsule TAKE 1 CAPSULE (24 MCG TOTAL) BY MOUTH 2  (TWO) TIMES DAILY WITH A MEAL. 06/12/21   [name], NP  metoprolol tartrate (LOPRESSOR) 25 MG tablet TAKE 1 TABLET BY MOUTH TWICE A DAY 03/13/21   Arnoldo Lenis, MD  montelukast (SINGULAIR) 10 MG tablet Take 10 mg by mouth daily. 08/25/16   [provider]  olmesartan (BENICAR) 40 MG tablet Take 1 tablet (40 mg total) by mouth daily. 04/06/20   Arnoldo Lenis, MD  pantoprazole (PROTONIX) 40 MG tablet Take 1 tablet (40 mg total) by mouth daily. Take 30 minutes before breakfast 02/18/21   [name], NP  rosuvastatin (CRESTOR) 5 MG tablet Take 1 tablet (5 mg total) by mouth daily. 07/19/19   Arnoldo Lenis, MD  spironolactone (ALDACTONE) 25 MG tablet TAKE 1 TABLET BY MOUTH EVERY DAY 03/13/21   Arnoldo Lenis, MD  XARELTO 20 MG TABS tablet TAKE 1 TABLET BY MOUTH EVERY DAY 05/09/21   Vickie Epley, MD      Allergies    Hydralazine, Latex, Other, and Prednisone    Review of Systems   Review of Systems  Musculoskeletal:  Positive for back pain.  All other systems reviewed and are negative.   Physical Exam Updated Vital Signs BP 140/73 (BP Location: Right Arm)   Pulse 76   Temp 98 F (36.7 C) (Oral)   Resp 18   Ht '5\' 7"'$  (1.702 m)  Wt 123.2 kg   SpO2 100%   BMI 42.52 kg/m  Physical Exam Vitals and nursing note reviewed.  Constitutional:      Appearance: Normal appearance.  HENT:     Head: Normocephalic and atraumatic.  Eyes:     General:        Right eye: No discharge.        Left eye: No discharge.     Conjunctiva/sclera: Conjunctivae normal.  Pulmonary:     Effort: Pulmonary effort is normal.  Musculoskeletal:     Comments: There is some decreased range of motion in the right knee which is chronic for the patient.  No point tenderness to the right knee.  Knee is not significantly swollen in comparison to the left.  Knee is not warm to palpation.  Skin:    General: Skin is warm and dry.     Findings: No rash.  Neurological:     General: No focal  deficit present.     Mental Status: She is alert.  Psychiatric:        Mood and Affect: Mood normal.        Behavior: Behavior normal.     ED Results / Procedures / Treatments   Labs (all labs ordered are listed, but only abnormal results are displayed) Labs Reviewed - No data to display  EKG None  Radiology No results found.  Procedures Procedures    Medications Ordered in ED Medications  oxyCODONE (Oxy IR/ROXICODONE) immediate release tablet 10 mg (10 mg Oral Given 07/13/21 2109)    ED Course/ Medical Decision Making/ A&P                           Medical Decision Making Deicy Rusk is a 61 y.o. female patient who presents to the emergency department today for further evaluation of chronic right knee pain.  Shared decision-making was done whether not to image the knee.  Patient has not had any new trauma or injury to the right knee.  We ultimately decided to forego imaging at this time.  I will give her 2 Roxicodone pills and plan to reassess.  Patient is in no acute distress and resting comfortably in the emergency department.   Risk Prescription drug management. Risk Details: Patient feeling better after 2 of her oxycodone.  Patient states that Tylenol has not been coming up for her.  The complicating factor here is the patient is on Xarelto and cannot take any NSAIDs.  I discussed having her follow-up with her orthopedist for either repeat injections or having a discussion about putting her on something like tramadol for her chronic knee pain.  She was encouraged to return to the emergency department for any worsening symptoms.  She is safe for discharge.   Final Clinical Impression(s) / ED Diagnoses Final diagnoses:  Acute pain of right knee    Rx / DC Orders ED Discharge Orders     None         Cherrie Gauze 07/13/21 2212    Carmin Muskrat, MD 07/14/21 1544

## 2021-07-15 DIAGNOSIS — M159 Polyosteoarthritis, unspecified: Secondary | ICD-10-CM | POA: Diagnosis not present

## 2021-07-15 DIAGNOSIS — I10 Essential (primary) hypertension: Secondary | ICD-10-CM | POA: Diagnosis not present

## 2021-07-15 DIAGNOSIS — E119 Type 2 diabetes mellitus without complications: Secondary | ICD-10-CM | POA: Diagnosis not present

## 2021-07-15 DIAGNOSIS — Z6839 Body mass index (BMI) 39.0-39.9, adult: Secondary | ICD-10-CM | POA: Diagnosis not present

## 2021-07-16 ENCOUNTER — Other Ambulatory Visit: Payer: Self-pay

## 2021-07-16 MED ORDER — RIVAROXABAN 20 MG PO TABS: 20.0000 mg | ORAL_TABLET | Freq: Every day | ORAL | 1 refills | Status: DC

## 2021-07-16 NOTE — Addendum Note (Signed)
Addended by: Brynda Peon on: 07/16/2021 07:53 AM   Modules accepted: Orders

## 2021-07-16 NOTE — Telephone Encounter (Signed)
Received faxed refill request for Xarelto '20mg'$  QD from [facility]. Pt last saw Dr Quentin Ore 03/12/21, last labs 03/12/21 Creat 1.02, age 61, weight 123.2kg, CrCl 114.07, based on CrCl pt is on appropriate dosage of Xarelto '20mg'$  QD for afib.  Will refill rx.

## 2021-07-17 ENCOUNTER — Ambulatory Visit (INDEPENDENT_AMBULATORY_CARE_PROVIDER_SITE_OTHER): Payer: BC Managed Care – PPO | Admitting: Orthopedic Surgery

## 2021-07-17 ENCOUNTER — Encounter: Payer: Self-pay | Admitting: Orthopedic Surgery

## 2021-07-17 DIAGNOSIS — M1711 Unilateral primary osteoarthritis, right knee: Secondary | ICD-10-CM
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
## 2021-07-24 ENCOUNTER — Other Ambulatory Visit: Payer: Self-pay | Admitting: Gastroenterology

## 2021-07-24 DIAGNOSIS — M159 Polyosteoarthritis, unspecified: Secondary | ICD-10-CM | POA: Diagnosis not present

## 2021-07-24 DIAGNOSIS — M25569 Pain in unspecified knee: Secondary | ICD-10-CM | POA: Diagnosis not present

## 2021-07-24 DIAGNOSIS — K649 Unspecified hemorrhoids: Secondary | ICD-10-CM

## 2021-07-24 DIAGNOSIS — K625 Hemorrhage of anus and rectum: Secondary | ICD-10-CM

## 2021-07-24 DIAGNOSIS — Z6841 Body Mass Index (BMI) 40.0 and over, adult: Secondary | ICD-10-CM | POA: Diagnosis not present

## 2021-07-24 MED ORDER — HYDROCORTISONE (PERIANAL) 2.5 % EX CREA: 1.0000 | TOPICAL_CREAM | Freq: Two times a day (BID) | CUTANEOUS | 1 refills | Status: DC

## 2021-07-24 NOTE — Telephone Encounter (Signed)
Phoned the pt back and she had a lot of improvement with the last banding. She was able to have BM's with her Linzess and wasn't constipated. Her bleeding resume yesterday, blood in stool this am. Pt is not using any hemorrhoid creams @ this point. Please advise

## 2021-07-24 NOTE — Telephone Encounter (Signed)
Recommend continuing Linzess to keep bowels moving well and start Anusol rectal cream 2-3 times daily for the next 7-10 days. I will send in a RX. Call back with any persistent problems. Will need to address with Vicente Males if persistent bleeding issues regarding the possibility of any further banding.

## 2021-07-24 NOTE — Telephone Encounter (Signed)
FYICyril Mourning I phoned and advised the pt of her instructions, Rx being sent in to her pharmacy, limiting her toilet time and possibility of further banding once Vicente Males return. She was also advised to call back with any persistent problems. Pt expressed understanding of these recommendations.

## 2021-07-24 NOTE — Telephone Encounter (Signed)
Also recommend limiting toilet time to 2-3 minutes and avoid straining.

## 2021-07-24 NOTE — Telephone Encounter (Signed)
I returned the pt's call and was advised that this was the first time yesterday she seen blood in her underwear after passing blood. No dripping or oozing blood. Pt states she is not constipated but this morning after having a BM she passed some blood in the toilet. She is not fatigued or weak. Pt states no pain. She is actually on her way back from [place] right now. (Blood just stained her underwear). Please advise

## 2021-07-24 NOTE — Telephone Encounter (Signed)
Returned the pt's call LMOVM to return call

## 2021-07-26 ENCOUNTER — Telehealth: Payer: Self-pay | Admitting: *Deleted

## 2021-07-26 NOTE — Chronic Care Management (AMB) (Signed)
  Care Coordination  Note  07/26/2021 Name: Lilliona Blakeney MRN: [id number] DOB: 1960-04-22  Jude Linck is a 61 y.o. year old female who is a primary care patient of [facility] [employer], [facility]. I reached out to [name] by phone today to offer care coordination services.      Ms. Kimberlin was given information about Care Coordination services today including:  The Care Coordination services include support from the care team which includes your Nurse Coordinator, Clinical Social Worker, or Pharmacist.  The Care Coordination team is here to help remove barriers to the health concerns and goals most important to you. Care Coordination services are voluntary and the patient may decline or stop services at any time by request to their care team member.   Patient agreed to services and verbal consent obtained.   Follow up plan: Telephone appointment with care coordination team member scheduled for:07/30/21  [place]: [phone]

## 2021-07-26 NOTE — Chronic Care Management (AMB) (Signed)
  Care Coordination  Note  07/26/2021 Name: Becky [name] MRN: [id number] DOB: 1960/11/07  Becky [name] is a 61 y.o. year old female who is a primary care patient of [facility] [employer], [facility]. I reached out to [name] by phone today to offer care coordination services.       Follow up plan: Unsuccessful telephone outreach attempt made. A HIPAA compliant phone message was left for the patient providing contact information and requesting a return call.  The care guide will reach out to the patient again over the next 7 days.  If patient calls provider office to request assistance with care coordination needs, please contact the care guide at the number below.   [place]  Direct Dial: [phone]

## 2021-07-30 ENCOUNTER — Ambulatory Visit: Payer: Self-pay | Admitting: *Deleted

## 2021-07-30 ENCOUNTER — Encounter: Payer: Self-pay | Admitting: *Deleted

## 2021-07-30 NOTE — Patient Instructions (Signed)
Visit Information  Thank you for taking time to visit with me today. Please don't hesitate to contact me if I can be of assistance to you.   Please call the care guide team at [phone] if you need to cancel or reschedule your appointment.   If you are experiencing a Mental Health or Behavioral Health Crisis or need someone to talk to, please call the Suicide and Crisis Lifeline: 988 call the [place] National Suicide Prevention Lifeline: 1-800-273-8255 or TTY: 1-800-799-4 TTY (1-800-799-4889) to talk to a trained counselor call 1-800-273-TALK (toll free, 24 hour hotline) go to [facility] [address], [place] ([phone]) call the [place] Crisis Line: 800-939-9988 call 911  Patient verbalizes understanding of instructions and care plan provided today and agrees to view in MyChart. Active MyChart status and patient understanding of how to access instructions and care plan via MyChart confirmed with patient.     No further follow up required.  Sybil Shrader, BSW, MSW, LCSW  Licensed Clinical Social Worker  [facility] [place] [facility]  Mailing Address-[address], [place], [place] [address] Physical Address-[address], [place], [place] [address] Toll Free Main # [phone] Fax # [phone] Cell # [phone] [email or web address]            

## 2021-07-30 NOTE — Patient Outreach (Signed)
  Care Coordination   Initial Visit Note   07/30/2021 Name: Becky [name] MRN: [id number] DOB: 12-06-1960  Becky [name] is a 61 y.o. year old female who sees [facility], [facility] for primary care. I spoke with  Becky [name] by phone today  What matters to the patients health and wellness today?  Requesting Education for Management of Type II Diabetes Mellitus.  Referral to [place] with [facility].  No Social Work Interventions Identified at this Time.   Goals Addressed   None     SDOH assessments and interventions completed:   Yes SDOH Interventions Today    Flowsheet Row Most Recent Value  SDOH Interventions   Food Insecurity Interventions Intervention Not Indicated  Financial Strain Interventions Intervention Not Indicated  Housing Interventions Intervention Not Indicated  Physical Activity Interventions Exercise Physiologist for Individual Counseling, Other (Comments)  [Requesting Appropriate Exercise Program]  Stress Interventions Intervention Not Indicated  Social Connections Interventions Intervention Not Indicated  Transportation Interventions Intervention Not Indicated       Care Coordination Interventions Activated:  No  Care Coordination Interventions:  No, not indicated.  Follow up plan: No further intervention required.  Encounter Outcome:  Pt. Visit Completed.  Nat Christen, BSW, MSW, LCSW  Licensed Education officer, [facility]  Mailing [place] [address], [place], [place] [address] Physical Address-[address] [place], [place] [address] Toll Free Main # [phone] Fax # [phone] Cell # [phone] [email or web address]

## 2021-08-01 ENCOUNTER — Telehealth: Payer: Self-pay | Admitting: *Deleted

## 2021-08-01 DIAGNOSIS — G4733 Obstructive sleep apnea (adult) (pediatric): Secondary | ICD-10-CM | POA: Diagnosis not present

## 2021-08-01 NOTE — Chronic Care Management (AMB) (Signed)
  Care Management   Outreach Note  08/01/2021 Name: Becky [name] MRN: [id number] DOB: October 23, 1960  An unsuccessful telephone outreach was attempted today. The patient was referred to the case management team for assistance with care management and care coordination.   Follow Up Plan:  A HIPAA compliant phone message was left for the patient providing contact information and requesting a return call.  The care management team will reach out to the patient again over the next 7 days.  If patient returns call to provider office, please advise to call [place]* at [phone].*  [place]  Direct Dial: [phone]

## 2021-08-01 NOTE — Chronic Care Management (AMB) (Signed)
  Care Coordination  Note  08/01/2021 Name: Becky [name] MRN: [id number] DOB: August 22, 1960  Becky [name] is a 61 y.o. year old female who is a primary care patient of [facility], [facility]. I reached out to [name] by phone today to offer care coordination services.       Follow up plan: Telephone appointment with care coordination team member scheduled for:08/07/21  [place]: [phone]

## 2021-08-06 ENCOUNTER — Other Ambulatory Visit: Payer: Self-pay

## 2021-08-06 MED ORDER — DILTIAZEM HCL ER COATED BEADS 360 MG PO CP24: ORAL_CAPSULE | ORAL | 3 refills | Status: DC

## 2021-08-06 NOTE — Telephone Encounter (Signed)
Refill diltiazem 360 mg qd #90,RF[facility]

## 2021-08-07 ENCOUNTER — Ambulatory Visit: Payer: Self-pay | Admitting: *Deleted

## 2021-08-07 DIAGNOSIS — M1712 Unilateral primary osteoarthritis, left knee: Secondary | ICD-10-CM

## 2021-08-07 NOTE — Patient Outreach (Signed)
  Care Coordination   Follow Up Visit Note   08/07/2021 Name: Becky [name] MRN: [id number] DOB: Apr 01, 1960  Becky [name] is a 61 y.o. year old female who sees [facility], [facility] for primary care. I spoke with  Becky [name] by phone today  What matters to the patients health and wellness today?  Knee pain, in need of surgery but unable to have it done until weight decreases    Goals Addressed               This Visit's Progress     Lose weight in effort to have knee surgery (pt-stated)        Care Coordination Interventions: Advised patient to discuss with primary care provider options regarding weight management Provided patient and/or caregiver with contact information about Will collaborate with Prep program at [facility] ([employer] or dietician) Reviewed recommended dietary changes: avoid fad diets, make small/incremental dietary and exercise changes, eat at the table and avoid eating in front of the TV, plan management of cravings, monitor snacking and cravings in food diary        SDOH assessments and interventions completed:  No     Care Coordination Interventions Activated:  Yes  Care Coordination Interventions:  Yes, provided   Follow up plan: Follow up call scheduled for within the next month    Encounter Outcome:  Pt. Visit Completed   Valente David, RN, MSN, [place] [facility]  Care Coordinator [phone]

## 2021-08-11 ENCOUNTER — Ambulatory Visit
Admission: EM | Admit: 2021-08-11 | Discharge: 2021-08-11 | Disposition: A | Discharge status: Home / Self Care | Payer: BC Managed Care – PPO

## 2021-08-11 ENCOUNTER — Encounter: Payer: Self-pay | Admitting: Emergency Medicine

## 2021-08-11 DIAGNOSIS — M25561 Pain in right knee: Secondary | ICD-10-CM | POA: Diagnosis not present

## 2021-08-11 DIAGNOSIS — G8929 Other chronic pain: Secondary | ICD-10-CM | POA: Diagnosis not present

## 2021-08-11 MED ORDER — METHYLPREDNISOLONE 4 MG PO TBPK: ORAL_TABLET | ORAL | 0 refills | Status: DC

## 2021-08-11 NOTE — Discharge Instructions (Addendum)
Take medication as prescribed. As discussed, please follow-up with Dr. Lorin Mercy as scheduled on August 17.  Because you are having symptoms from the injections, you should follow-up with orthopedics for further evaluation. Continue Tylenol that you are currently taking. Recommend use of ice as needed for pain.  Apply ice for 20 minutes, remove for 1 hour, then repeat as needed. Follow-up as needed.

## 2021-08-11 NOTE — ED Triage Notes (Signed)
Right knee pain.  Has had steam cell injections in knee and states the pain is worse since that was done.  Injection was done June 12th.   Has been using 2 cream prescribed to knee - cyclobenzaprine 4%/dicofenac 5% and BR+NAD topical '200mg'$ 
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
## 2021-08-14 NOTE — Addendum Note (Signed)
Addended byValente David on: 08/14/2021 04:02 PM   Modules accepted: Orders

## 2021-08-15 NOTE — Addendum Note (Signed)
Addended byValente David on: 08/15/2021 03:47 PM   Modules accepted: Orders

## 2021-08-19 ENCOUNTER — Telehealth: Payer: Self-pay | Admitting: *Deleted

## 2021-08-19 NOTE — Telephone Encounter (Signed)
Returned my call regarding PREP Class. Unable to attend afternoon. Needs morning. Will call back with fall/winter class availability once scheduled.

## 2021-08-19 NOTE — Telephone Encounter (Signed)
Left voicemail for patient to return call regarding PREP program at the [facility].

## 2021-08-22 ENCOUNTER — Ambulatory Visit (INDEPENDENT_AMBULATORY_CARE_PROVIDER_SITE_OTHER): Payer: BC Managed Care – PPO

## 2021-08-22 ENCOUNTER — Encounter: Payer: Self-pay | Admitting: Orthopaedic Surgery

## 2021-08-22 ENCOUNTER — Ambulatory Visit (INDEPENDENT_AMBULATORY_CARE_PROVIDER_SITE_OTHER): Payer: BC Managed Care – PPO | Admitting: Orthopaedic Surgery

## 2021-08-22 VITALS — Ht 67.0 in | Wt 265.0 lb

## 2021-08-22 DIAGNOSIS — G8929 Other chronic pain: Secondary | ICD-10-CM | POA: Diagnosis not present

## 2021-08-22 DIAGNOSIS — M1712 Unilateral primary osteoarthritis, left knee: Secondary | ICD-10-CM | POA: Diagnosis not present

## 2021-08-22 DIAGNOSIS — M25561 Pain in right knee: Secondary | ICD-10-CM | POA: Diagnosis not present

## 2021-08-22 MED ORDER — LIDOCAINE HCL 1 % IJ SOLN
0.5000 mL | INTRAMUSCULAR | Status: AC | PRN
  Administered 2021-08-22: .5 mL

## 2021-08-22 MED ORDER — BUPIVACAINE HCL 0.25 % IJ SOLN
4.0000 mL | INTRAMUSCULAR | Status: AC | PRN
  Administered 2021-08-22: 4 mL via INTRA_ARTICULAR

## 2021-08-22 MED ORDER — METHYLPREDNISOLONE ACETATE 40 MG/ML IJ SUSP
40.0000 mg | INTRAMUSCULAR | Status: AC | PRN
  Administered 2021-08-22: 40 mg via INTRA_ARTICULAR
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
## 2021-09-01 DIAGNOSIS — G4733 Obstructive sleep apnea (adult) (pediatric): Secondary | ICD-10-CM | POA: Diagnosis not present

## 2021-09-05 ENCOUNTER — Ambulatory Visit: Payer: Self-pay | Admitting: *Deleted

## 2021-09-05 NOTE — Patient Outreach (Signed)
  Care Coordination   Follow Up Visit Note   09/05/2021 Name: Tawan Corkern MRN: [id number] DOB: Jul 22, 1960  Tianni Escamilla is a 60 y.o. year old female who sees Redmond School, MD for primary care. I spoke with  [name] by phone today.  What matters to the patients health and wellness today?  Contacted by PREP program at the [facility], patient need morning classes, they only have evening classes currently.  They will call her back to discuss morning classes in the fall.  She agrees to referral to dietician, call placed to [place], spoke with Denton Ar, will request order be placed.     Goals Addressed               This Visit's Progress     Lose weight in effort to have knee surgery (pt-stated)   On track     Care Coordination Interventions: Advised patient to discuss with primary care provider options regarding weight management Collaboration with provider for dietician referral Provided patient and/or caregiver with contact information about Will collaborate with Prep program at [facility] ([employer] or dietician) Reviewed recommended dietary changes: avoid fad diets, make small/incremental dietary and exercise changes, eat at the table and avoid eating in front of the TV, plan management of cravings, monitor snacking and cravings in food diary        SDOH assessments and interventions completed:  No     Care Coordination Interventions Activated:  Yes  Care Coordination Interventions:  Yes, provided   Follow up plan: Follow up call scheduled for 9/28    Encounter Outcome:  Pt. Visit Completed   Valente David, RN, MSN, [place] Care Management Care Management Coordinator [phone]

## 2021-09-05 NOTE — Patient Instructions (Signed)
Visit Information  Thank you for taking time to visit with me today. Please don't hesitate to contact me if I can be of assistance to you before our next scheduled telephone appointment.  Following are the goals we discussed today:  Listen for call regarding dietician referral  Our next appointment is by telephone on 9/28  Please call the care guide team at [phone] if you need to cancel or reschedule your appointment.   Please call the Suicide and Crisis Lifeline: 988 call the [place] National Suicide Prevention Lifeline: 787-713-0014 or TTY: 510-676-6719 TTY (203) 810-4257) to talk to a trained counselor call 1-800-273-TALK (toll free, 24 hour hotline) call the [facility]: [phone])394-2716 call 911 if you are experiencing a Mental Health or [place] or need someone to talk to.  Patient verbalizes understanding of instructions and care plan provided today and agrees to view in [place]. Active MyChart status and patient understanding of how to access instructions and care plan via MyChart confirmed with patient.     The patient has been provided with contact information for the care management team and has been advised to call with any health related questions or concerns.   Valente David, RN, MSN, [place] Care Management Care Management Coordinator [phone]

## 2021-09-12 ENCOUNTER — Encounter: Payer: BC Managed Care – PPO | Admitting: *Deleted

## 2021-09-13 ENCOUNTER — Other Ambulatory Visit (HOSPITAL_COMMUNITY)
Admission: RE | Admit: 2021-09-13 | Discharge: 2021-09-13 | Disposition: A | Discharge status: Home / Self Care | Payer: BC Managed Care – PPO | Source: Ambulatory Visit | Attending: Cardiology | Admitting: Cardiology

## 2021-09-13 ENCOUNTER — Ambulatory Visit: Payer: BC Managed Care – PPO | Attending: Cardiology | Admitting: Cardiology

## 2021-09-13 ENCOUNTER — Encounter: Payer: Self-pay | Admitting: Cardiology

## 2021-09-13 VITALS — BP 138/78 | HR 86 | Ht 67.0 in | Wt 269.0 lb

## 2021-09-13 DIAGNOSIS — R600 Localized edema: Secondary | ICD-10-CM | POA: Diagnosis not present

## 2021-09-13 DIAGNOSIS — E785 Hyperlipidemia, unspecified: Secondary | ICD-10-CM

## 2021-09-13 DIAGNOSIS — I480 Paroxysmal atrial fibrillation: Secondary | ICD-10-CM

## 2021-09-13 DIAGNOSIS — I10 Essential (primary) hypertension: Secondary | ICD-10-CM

## 2021-09-13 LAB — COMPREHENSIVE METABOLIC PANEL
ALT: 23 U/L (ref 0–44)
AST: 16 U/L (ref 15–41)
Albumin: 3.7 g/dL (ref 3.5–5.0)
Alkaline Phosphatase: 52 U/L (ref 38–126)
Anion gap: 8 (ref 5–15)
BUN: 18 mg/dL (ref 6–20)
CO2: 23 mmol/L (ref 22–32)
Calcium: 8.9 mg/dL (ref 8.9–10.3)
Chloride: 109 mmol/L (ref 98–111)
Creatinine, Ser: 1.06 mg/dL — ABNORMAL HIGH (ref 0.44–1.00)
GFR, Estimated: >60 mL/min (ref >60)
Glucose, Bld: 134 mg/dL — ABNORMAL HIGH (ref 70–99)
Potassium: 3.8 mmol/L (ref 3.5–5.1)
Sodium: 140 mmol/L (ref 135–145)
Total Bilirubin: 0.7 mg/dL (ref 0.3–1.2)
Total Protein: 6.5 g/dL (ref 6.5–8.1)

## 2021-09-13 LAB — LIPID PANEL
Cholesterol: 253 mg/dL — ABNORMAL HIGH (ref 0–200)
HDL: 60 mg/dL (ref >40)
LDL Cholesterol: 174 mg/dL — ABNORMAL HIGH (ref 0–99)
Total CHOL/HDL Ratio: 4.2 RATIO
Triglycerides: 93 mg/dL (ref <150)
VLDL: 19 mg/dL (ref 0–40)

## 2021-09-13 LAB — CBC
HCT: 37 % (ref 36.0–46.0)
Hemoglobin: 12.3 g/dL (ref 12.0–15.0)
MCH: 31.5 pg (ref 26.0–34.0)
MCHC: 33.2 g/dL (ref 30.0–36.0)
MCV: 94.9 fL (ref 80.0–100.0)
Platelets: 277 10*3/uL (ref 150–400)
RBC: 3.9 MIL/uL (ref 3.87–5.11)
RDW: 13.2 % (ref 11.5–15.5)
WBC: 8.5 10*3/uL (ref 4.0–10.5)
nRBC: 0 % (ref 0.0–0.2)

## 2021-09-13 LAB — MAGNESIUM: Magnesium: 2 mg/dL (ref 1.7–2.4)

## 2021-09-13 MED ORDER — FUROSEMIDE 40 MG PO TABS: 40.0000 mg | ORAL_TABLET | Freq: Every day | ORAL | 11 refills | Status: DC | PRN

## 2021-09-13 NOTE — Patient Instructions (Signed)
Medication Instructions:  Your physician recommends that you continue on your current medications as directed. Please refer to the Current Medication list given to you today.   Labwork: -CMET -MAG -Fasting Lipid Panel (Nothing to eat or drink 8 hours prior to having lab drawn) -CBC  Testing/Procedures: None  Follow-Up: Follow up with Dr. Harl Bowie in 6 months.   Any Other Special Instructions Will Be Listed Below (If Applicable).     If you need a refill on your cardiac medications before your next appointment, please call your pharmacy.
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
## 2021-09-16 ENCOUNTER — Telehealth: Payer: Self-pay | Admitting: Cardiology

## 2021-09-16 MED ORDER — RIVAROXABAN 20 MG PO TABS: 20.0000 mg | ORAL_TABLET | Freq: Every day | ORAL | 1 refills | Status: DC

## 2021-09-16 NOTE — Telephone Encounter (Signed)
Prescription refill request for Xarelto received.  Indication: PAF Last office visit: 09/13/21  Zandra Abts MD Weight: 122kg Age: 61 Scr: 1.06 on 09/13/21 CrCl: 108.70  Based on above findings Xarelto '20mg'$  daily is the appropriate dose.  Refill approved.

## 2021-09-16 NOTE — Telephone Encounter (Signed)
*  STAT* If patient is at the pharmacy, call can be transferred to refill team.   1. Which medications need to be refilled? (please list name of each medication and dose if known)  rivaroxaban (XARELTO) 20 MG TABS tablet  2. Which pharmacy/location (including street and city if local pharmacy) is medication to be sent to?  [facility]  [place] : [phone] Phone: [phone]  3. Do they need a 30 day or 90 day supply? [place]

## 2021-09-19 ENCOUNTER — Ambulatory Visit (INDEPENDENT_AMBULATORY_CARE_PROVIDER_SITE_OTHER): Payer: BC Managed Care – PPO | Admitting: Orthopaedic Surgery

## 2021-09-19 ENCOUNTER — Encounter: Payer: Self-pay | Admitting: Orthopaedic Surgery

## 2021-09-19 DIAGNOSIS — T8484XA Pain due to internal orthopedic prosthetic devices, implants and grafts, initial encounter: Secondary | ICD-10-CM | POA: Diagnosis not present
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
## 2021-09-24 ENCOUNTER — Telehealth: Payer: Self-pay | Admitting: Cardiology

## 2021-09-24 NOTE — Telephone Encounter (Signed)
I spoke with patient. She noted left ankle and foot swelling and took her prn lasix 40 mg. She states it helped reduced swelling. Her weight was 275 lbs at her office visit with ortho on 09/19/21.   She will f/u in 2 days with B.Strader, PA-C

## 2021-09-24 NOTE — Telephone Encounter (Signed)
Pt c/o Shortness Of Breath: STAT if SOB developed within the last 24 hours or pt is noticeably SOB on the phone  1. Are you currently SOB (can you hear that pt is SOB on the phone)? No  2. How long have you been experiencing SOB? Last couple of days  3. Are you SOB when sitting or when up moving around? Moving around  4. Are you currently experiencing any other symptoms? Pt states she feels fine today, but yesterday 09/18 she was sweating and having shortness of breath with rapid heart rate while up moving around. Called to schedule appt, but would like to talk to someone about this.
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
## 2021-09-26 ENCOUNTER — Ambulatory Visit: Payer: BC Managed Care – PPO | Attending: Student | Admitting: Student

## 2021-09-26 ENCOUNTER — Encounter: Payer: Self-pay | Admitting: Student

## 2021-09-26 VITALS — BP 132/80 | HR 84 | Ht 67.0 in | Wt 271.0 lb

## 2021-09-26 DIAGNOSIS — I480 Paroxysmal atrial fibrillation: Secondary | ICD-10-CM | POA: Diagnosis not present

## 2021-09-26 DIAGNOSIS — I10 Essential (primary) hypertension: Secondary | ICD-10-CM | POA: Diagnosis not present

## 2021-09-26 DIAGNOSIS — R002 Palpitations: Secondary | ICD-10-CM | POA: Diagnosis not present

## 2021-09-26 DIAGNOSIS — I2510 Atherosclerotic heart disease of native coronary artery without angina pectoris: Secondary | ICD-10-CM | POA: Diagnosis not present

## 2021-09-26 DIAGNOSIS — E785 Hyperlipidemia, unspecified: Secondary | ICD-10-CM

## 2021-09-26 MED ORDER — ROSUVASTATIN CALCIUM 10 MG PO TABS
10.0000 mg | ORAL_TABLET | Freq: Every day | ORAL | 3 refills | Status: DC
Start: 2021-09-26 — End: 2021-11-09

## 2021-09-26 MED ORDER — METOPROLOL TARTRATE 50 MG PO TABS
50.0000 mg | ORAL_TABLET | Freq: Two times a day (BID) | ORAL | 3 refills | Status: DC
Start: 2021-09-26 — End: 2022-09-17

## 2021-09-26 NOTE — Patient Instructions (Signed)
Medication Instructions:   INCREASE Metoprolol 50 mg twice a day  INCREASE Crestor to 10 mg daily   Labwork: None today  Testing/Procedures: None today  Follow-Up: 2-3 months with MD or APP  2 week Nurse visit for EKG/BP  Any Other Special Instructions Will Be Listed Below (If Applicable).  If you need a refill on your cardiac medications before your next appointment, please call your pharmacy.

## 2021-09-27 NOTE — Progress Notes (Signed)
If afib looks to be peristent on f/u EKG and ongoing symptoms despite rate control would pursue DCCV as you have planned  Zandra Abts MD

## 2021-09-30 DIAGNOSIS — G4733 Obstructive sleep apnea (adult) (pediatric): Secondary | ICD-10-CM | POA: Diagnosis not present

## 2021-10-03 ENCOUNTER — Ambulatory Visit: Payer: Self-pay | Admitting: *Deleted

## 2021-10-03 NOTE — Patient Instructions (Signed)
Visit Information  Thank you for taking time to visit with me today. Please don't hesitate to contact me if I can be of assistance to you before our next scheduled telephone appointment.  Following are the goals we discussed today:  Follow up with PCP office regarding request to see dietician  Our next appointment is by telephone on 10/26  Please call the care guide team at [phone] if you need to cancel or reschedule your appointment.   Please call the Suicide and Crisis Lifeline: 988 call the [place] National Suicide Prevention Lifeline: 9511857475 or TTY: (657) 123-6491 TTY (360) 609-7423) to talk to a trained counselor call 1-800-273-TALK (toll free, 24 hour hotline) call the [facility]: 7875611164 call 911 if you are experiencing a Mental Health or [place] or need someone to talk to.  Patient verbalizes understanding of instructions and care plan provided today and agrees to view in [place]. Active MyChart status and patient understanding of how to access instructions and care plan via MyChart confirmed with patient.     The patient has been provided with contact information for the care management team and has been advised to call with any health related questions or concerns.   Valente David, RN, MSN, [facility] Care Management Coordinator [phone]

## 2021-10-03 NOTE — Patient Outreach (Signed)
  Care Coordination   Follow Up Visit Note   10/03/2021 Name: Becky [name] MRN: [id number] DOB: 11-09-1960  Becky [name] is a 61 y.o. year old female who sees Redmond School, MD for primary care. I spoke with  [name] by phone today.  What matters to the patients health and wellness today?  Continues to have leg pain. State recommendations from ortho was to have hardware removed and then set another date to have knee replacement.  She still has not received call from dietician to discuss diet recommendations that would help to lose weight and relieve pressure from knees.  Denies any urgent concerns, encouraged to contact this care manager with questions.      Goals Addressed               This Visit's Progress     Lose weight in effort to have knee surgery (pt-stated)   On track     Care Coordination Interventions: Advised patient to discuss with primary care provider options regarding weight management Collaboration with provider for dietician referral Provided patient and/or caregiver with contact information about Will collaborate with Prep program at [facility] ([employer] or dietician) Reviewed recommended dietary changes: avoid fad diets, make small/incremental dietary and exercise changes, eat at the table and avoid eating in front of the TV, plan management of cravings, monitor snacking and cravings in food diary Call placed to PCP office to follow up on dietician referral Reviewed upcoming ortho appointment for 10/25 (pre-op for hardware removal)        SDOH assessments and interventions completed:  No     Care Coordination Interventions Activated:  Yes  Care Coordination Interventions:  Yes, provided   Follow up plan: Follow up call scheduled for 10/26    Encounter Outcome:  Pt. Visit Completed   Valente David, RN, MSN, [place] Care Management Care Management Coordinator [phone]

## 2021-10-14 ENCOUNTER — Telehealth: Payer: Self-pay

## 2021-10-14 ENCOUNTER — Telehealth: Payer: Self-pay | Admitting: *Deleted

## 2021-10-14 ENCOUNTER — Ambulatory Visit: Payer: BC Managed Care – PPO | Attending: Cardiology

## 2021-10-14 VITALS — BP 126/70 | HR 64 | Ht 67.0 in | Wt 265.0 lb

## 2021-10-14 DIAGNOSIS — I480 Paroxysmal atrial fibrillation: Secondary | ICD-10-CM | POA: Diagnosis not present

## 2021-10-14 NOTE — Progress Notes (Signed)
Patient presents today for nurse visit- EKG/BP check. Pt states she feels better since being able to start her medication. Pt denies cp, sob, N/V. Pt is compliant with medications.

## 2021-10-14 NOTE — Telephone Encounter (Signed)
Contacted patient regarding PREP Class referral. Accepted class start 12/10/2021 at the [facility]. Will contact close to start date to set up intake assessment appointment.

## 2021-10-14 NOTE — Telephone Encounter (Signed)
-----   Message from Erma Heritage, [place] sent at 10/14/2021  4:24 PM EDT ----- Please let the patient know I reviewed her EKG and she did convert back to normal sinus rhythm since her last office visit following dose adjustment of Lopressor. If she is feeling better, would continue her current medication regimen. Thanks!

## 2021-10-14 NOTE — Telephone Encounter (Signed)
Patient notified and verbalized understanding. Patient had no questions or concerns at this time.  

## 2021-10-28 NOTE — Pre-Procedure Instructions (Signed)
Surgical Instructions    Your procedure is scheduled on Friday, November 3rd.  Report to [facility] Main Entrance "A" at 05:30 A.M., then check in with the Admitting office.  Call this number if you have problems the morning of surgery:  [phone]   If you have any questions prior to your surgery date call [phone]: Open Monday-Friday 8am-4pm    Remember:  Do not eat after midnight the night before your surgery  You may drink clear liquids until 04:30 AM the morning of your surgery.   Clear liquids allowed are: Water, Non-Citrus Juices (without pulp), Carbonated Beverages, Clear Tea, Black Coffee Only (NO MILK, CREAM OR POWDERED CREAMER of any kind), and Gatorade.    Take these medicines the morning of surgery with A SIP OF WATER  metoprolol tartrate (LOPRESSOR)  montelukast (SINGULAIR)  pantoprazole (PROTONIX)  rosuvastatin (CRESTOR)  If needed: acetaminophen (TYLENOL) budesonide (RHINOCORT AQUA)  EPINEPHrine fluticasone (FLONASE)  Lifitegrast (XIIDRA)   Follow your surgeon's instructions on when to stop rivaroxaban (XARELTO).  If no instructions were given by your surgeon then you will need to call the office to get those instructions.     As of today, STOP taking any Aspirin (unless otherwise instructed by your surgeon) Aleve, Naproxen, Ibuprofen, Motrin, Advil, Goody's, BC's, all herbal medications, fish oil, and all vitamins.                     Do NOT Smoke (Tobacco/Vaping) for 24 hours prior to your procedure.  If you use a CPAP at night, you may bring your mask/headgear for your overnight stay.   Contacts, glasses, piercing's, hearing aid's, dentures or partials may not be worn into surgery, please bring cases for these belongings.    For patients admitted to the hospital, discharge time will be determined by your treatment team.   Patients discharged the day of surgery will not be allowed to drive home, and someone needs to stay with them for 24  hours.  SURGICAL WAITING ROOM VISITATION Patients having surgery or a procedure may have no more than 2 support people in the waiting area - these visitors may rotate.   Children under the age of 54 must have an adult with them who is not the patient. If the patient needs to stay at the hospital during part of their recovery, the visitor guidelines for inpatient rooms apply. Pre-op nurse will coordinate an appropriate time for 1 support person to accompany patient in pre-op.  This support person may not rotate.   Please refer to the [facility] website for the visitor guidelines for Inpatients (after your surgery is over and you are in a regular room).    Special instructions:   [place]- Preparing For Surgery  Before surgery, you can play an important role. Because skin is not sterile, your skin needs to be as free of germs as possible. You can reduce the number of germs on your skin by washing with CHG (chlorahexidine gluconate) Soap before surgery.  CHG is an antiseptic cleaner which kills germs and bonds with the skin to continue killing germs even after washing.    Oral Hygiene is also important to reduce your risk of infection.  Remember - BRUSH YOUR TEETH THE MORNING OF SURGERY WITH YOUR REGULAR TOOTHPASTE  Please do not use if you have an allergy to CHG or antibacterial soaps. If your skin becomes reddened/irritated stop using the CHG.  Do not shave (including legs and underarms) for at least 48  hours prior to first CHG shower. It is OK to shave your face.  Please follow these instructions carefully.   Shower the NIGHT BEFORE SURGERY and the MORNING OF SURGERY  If you chose to wash your hair, wash your hair first as usual with your normal shampoo.  After you shampoo, rinse your hair and body thoroughly to remove the shampoo.  Use CHG Soap as you would any other liquid soap. You can apply CHG directly to the skin and wash gently with a scrungie or a clean washcloth.   Apply  the CHG Soap to your body ONLY FROM THE NECK DOWN.  Do not use on open wounds or open sores. Avoid contact with your eyes, ears, mouth and genitals (private parts). Wash Face and genitals (private parts)  with your normal soap.   Wash thoroughly, paying special attention to the area where your surgery will be performed.  Thoroughly rinse your body with warm water from the neck down.  DO NOT shower/wash with your normal soap after using and rinsing off the CHG Soap.  Pat yourself dry with a CLEAN TOWEL.  Wear CLEAN PAJAMAS to bed the night before surgery  Place CLEAN SHEETS on your bed the night before your surgery  DO NOT SLEEP WITH PETS.   Day of Surgery: Take a shower with CHG soap. Do not wear jewelry or makeup Do not wear lotions, powders, perfumes, or deodorant. Do not shave 48 hours prior to surgery.  Do not bring valuables to the hospital. [facility] is not responsible for any belongings or valuables. Do not wear nail polish, gel polish, artificial nails, or any other type of covering on natural nails (fingers and toes) If you have artificial nails or gel coating that need to be removed by a nail salon, please have this removed prior to surgery. Artificial nails or gel coating may interfere with anesthesia's ability to adequately monitor your vital signs. Wear Clean/Comfortable clothing the morning of surgery Remember to brush your teeth WITH YOUR REGULAR TOOTHPASTE.   Please read over the following fact sheets that you were given.    If you received a COVID test during your pre-op visit  it is requested that you wear a mask when out in public, stay away from anyone that may not be feeling well and notify your surgeon if you develop symptoms. If you have been in contact with anyone that has tested positive in the last 10 days please notify you surgeon.

## 2021-10-29 ENCOUNTER — Encounter (HOSPITAL_COMMUNITY)
Admission: RE | Admit: 2021-10-29 | Discharge: 2021-10-29 | Disposition: A | Discharge status: Home / Self Care | Payer: BC Managed Care – PPO | Source: Ambulatory Visit | Attending: Orthopaedic Surgery | Admitting: Orthopaedic Surgery

## 2021-10-29 ENCOUNTER — Encounter (HOSPITAL_COMMUNITY): Payer: Self-pay

## 2021-10-29 ENCOUNTER — Other Ambulatory Visit: Payer: Self-pay

## 2021-10-29 VITALS — BP 124/64 | HR 64 | Temp 97.6°F | Resp 18 | Ht 67.0 in | Wt 267.5 lb

## 2021-10-29 DIAGNOSIS — I10 Essential (primary) hypertension: Secondary | ICD-10-CM | POA: Diagnosis not present

## 2021-10-29 DIAGNOSIS — Z01812 Encounter for preprocedural laboratory examination: Secondary | ICD-10-CM | POA: Insufficient documentation

## 2021-10-29 DIAGNOSIS — Z01818 Encounter for other preprocedural examination: Secondary | ICD-10-CM

## 2021-10-29 LAB — BASIC METABOLIC PANEL
Anion gap: 10 (ref 5–15)
BUN: 11 mg/dL (ref 6–20)
CO2: 23 mmol/L (ref 22–32)
Calcium: 9.7 mg/dL (ref 8.9–10.3)
Chloride: 105 mmol/L (ref 98–111)
Creatinine, Ser: 1.14 mg/dL — ABNORMAL HIGH (ref 0.44–1.00)
GFR, Estimated: 55 mL/min — ABNORMAL LOW (ref >60)
Glucose, Bld: 138 mg/dL — ABNORMAL HIGH (ref 70–99)
Potassium: 3.9 mmol/L (ref 3.5–5.1)
Sodium: 138 mmol/L (ref 135–145)

## 2021-10-29 LAB — CBC
HCT: 40.4 % (ref 36.0–46.0)
Hemoglobin: 13 g/dL (ref 12.0–15.0)
MCH: 30.5 pg (ref 26.0–34.0)
MCHC: 32.2 g/dL (ref 30.0–36.0)
MCV: 94.8 fL (ref 80.0–100.0)
Platelets: 357 10*3/uL (ref 150–400)
RBC: 4.26 MIL/uL (ref 3.87–5.11)
RDW: 12.8 % (ref 11.5–15.5)
WBC: 8.3 10*3/uL (ref 4.0–10.5)
nRBC: 0 % (ref 0.0–0.2)

## 2021-10-29 LAB — SURGICAL PCR SCREEN
MRSA, PCR: NEGATIVE
Staphylococcus aureus: NEGATIVE

## 2021-10-29 NOTE — Progress Notes (Signed)
PCP - [employer] Cardiologist - Dr. Harl Bowie   PPM/ICD - Denies Device Orders -  Rep Notified -   Chest x-ray - NI EKG - 10/14/21 Stress Test - 11/04/18 ECHO - 04/01/21 Cardiac Cath - 08/03/19  Sleep Study - Yes has OSA CPAP - nightly  DM - Per patient borderline  Blood Thinner Instructions: Xarelto Requested patient to call surgeon's office to find out when to stop her Xarelto.  Aspirin Instructions:Denies  ERAS Protcol -Yes   COVID TEST- NI   Anesthesia review: Yes cardiac history  Patient denies shortness of breath, fever, cough and chest pain at PAT appointment   All instructions explained to the patient, with a verbal understanding of the material. Patient agrees to go over the instructions while at home for a better understanding. The opportunity to ask questions was provided.

## 2021-10-30 ENCOUNTER — Ambulatory Visit (INDEPENDENT_AMBULATORY_CARE_PROVIDER_SITE_OTHER): Payer: BC Managed Care – PPO | Admitting: Surgery

## 2021-10-30 ENCOUNTER — Telehealth: Payer: Self-pay | Admitting: Cardiology

## 2021-10-30 VITALS — BP 124/80 | HR 71 | Ht 67.5 in | Wt 270.0 lb

## 2021-10-30 DIAGNOSIS — M25561 Pain in right knee: Secondary | ICD-10-CM

## 2021-10-30 DIAGNOSIS — G8929 Other chronic pain: Secondary | ICD-10-CM

## 2021-10-30 DIAGNOSIS — T8484XA Pain due to internal orthopedic prosthetic devices, implants and grafts, initial encounter: Secondary | ICD-10-CM

## 2021-10-30 DIAGNOSIS — G4733 Obstructive sleep apnea (adult) (pediatric): Secondary | ICD-10-CM | POA: Diagnosis not present

## 2021-10-30 DIAGNOSIS — M1711 Unilateral primary osteoarthritis, right knee: Secondary | ICD-10-CM
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
## 2021-10-30 NOTE — Telephone Encounter (Signed)
   Pre-operative Risk Assessment    Patient Name: Becky [name]  DOB: [birth year] MRN: [id number]      Request for Surgical Clearance    Procedure:   R knee hardware removal  Date of Surgery:  Clearance TBD 11/08/21                                 Surgeon:  Thurmond Butts, MD Surgeon's Group or Practice Name:  [facility] number:  [phone] Fax number:  [phone]   Type of Clearance Requested:   - Pharmacy:  Hold Rivaroxaban (Xarelto) Will need to stop Xarelto before surgery   Type of Anesthesia:  Not Indicated   Additional requests/questions:    [name]   10/30/2021, 4:36 PM

## 2021-10-30 NOTE — Telephone Encounter (Signed)
On Xarelto for paroxysmal atrial fibrillation. Will route to pharmacy team for input.   CHA2DS2-VASc Score = 3   This indicates a 3.2% annual risk of stroke. The patient's score is based upon: CHF History: 0 HTN History: 1 Diabetes History: 0 Stroke History: 0 Vascular Disease History: 1 Age Score: 0 Gender Score: 1   Platelet count 357 (10/29/21)  Creatinine clearance 71 mL/min [adjusted due to weight] (10/29/21)  Loel Dubonnet, NP

## 2021-10-31 ENCOUNTER — Ambulatory Visit: Payer: Self-pay | Admitting: *Deleted

## 2021-10-31 NOTE — Telephone Encounter (Signed)
Patient with diagnosis of afib on Xarelto for anticoagulation.    Procedure: R knee hardware removal Date of procedure: 11/08/21  CHA2DS2-VASc Score = 3   This indicates a 3.2% annual risk of stroke. The patient's score is based upon: CHF History: 0 HTN History: 1 Diabetes History: 0 Stroke History: 0 Vascular Disease History: 1 Age Score: 0 Gender Score: 1      CrCl 71 ml/min Platelet count 357  Per office protocol, patient can hold Xarelto for 3 days prior to procedure.    **This guidance is not considered finalized until pre-operative APP has relayed final recommendations.**

## 2021-10-31 NOTE — Patient Outreach (Signed)
  Care Coordination   Follow Up Visit Note   10/31/2021 Name: Becky [name] MRN: [id number] DOB: 09-Aug-1960  Becky [name] is a 61 y.o. year old female who sees Becky School, MD for primary care. I spoke with  Becky [name] by phone today.  What matters to the patients health and wellness today?  Remains with painful knee, will have hardware removed next week.  Hoping to continue weight loss to have full total knee replacement in the future.     Goals Addressed               This Visit's Progress     Lose weight in effort to have knee surgery (pt-stated)   On track     Care Coordination Interventions: Advised patient to discuss with primary care provider options regarding weight management Provided patient and/or caregiver with contact information about transportation options should she need outpatient PT post surgery (community resource or dietician) Reviewed recommended dietary changes: avoid fad diets, make small/incremental dietary and exercise changes, eat at the table and avoid eating in front of the TV, plan management of cravings, monitor snacking and cravings in food diary Advised patient to discuss plan for post op (home health versus outpatient) with provider Reviewed upcoming surgery date to remove painful hardware, scheduled for 11/3 Confirmed appointment scheduled with dietician for 11/16 Reviewed plan for support in the home post surgery (husband and daughters)        SDOH assessments and interventions completed:  No     Care Coordination Interventions Activated:  Yes  Care Coordination Interventions:  Yes, provided   Follow up plan: Follow up call scheduled for 11/8    Encounter Outcome:  Pt. Visit Completed   Becky David, RN, MSN, [place] Care Management Care Management Coordinator [phone]

## 2021-10-31 NOTE — Telephone Encounter (Signed)
   Patient Name: Becky [name]  DOB: [birth year] MRN: [id number]  Primary Cardiologist: Carlyle Dolly, MD  Chart reviewed as part of pre-operative protocol coverage. Pharmacy clearance only. Per office protocols and pharmacist review  Becky [name] may hold Xarelto 3 days prior to planned procedure.   I will route this recommendation to the requesting party via Epic fax function and remove from pre-op pool.  Please call with questions.  Loel Dubonnet, NP 10/31/2021, 3:19 PM
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
## 2021-10-31 NOTE — Patient Instructions (Signed)
Visit Information  Thank you for taking time to visit with me today. Please don't hesitate to contact me if I can be of assistance to you before our next scheduled telephone appointment.  Following are the goals we discussed today:  Follow up with cardiology office regarding holding Xarelto if no call back.  Our next appointment is by telephone on 11/8  Please call the care guide team at [phone] if you need to cancel or reschedule your appointment.   Please call the Suicide and Crisis Lifeline: 988 call the [place] National Suicide Prevention Lifeline: (787)346-6875 or TTY: 239-683-7767 TTY 952-428-3354) to talk to a trained counselor call 1-800-273-TALK (toll free, 24 hour hotline) call the [facility]: 412-107-2741 call 911 if you are experiencing a Mental Health or [place] or need someone to talk to.  Patient verbalizes understanding of instructions and care plan provided today and agrees to view in [place]. Active MyChart status and patient understanding of how to access instructions and care plan via MyChart confirmed with patient.     The patient has been provided with contact information for the care management team and has been advised to call with any health related questions or concerns.   Valente David, RN, MSN, [place] Care Management Care Management Coordinator [phone]

## 2021-11-06 ENCOUNTER — Telehealth: Payer: Self-pay | Admitting: Orthopaedic Surgery

## 2021-11-06 NOTE — Telephone Encounter (Signed)
Patient scheduled for removal of tibial plate 11/08/2021. Can you advise how long she may be out of work post op?

## 2021-11-06 NOTE — Telephone Encounter (Signed)
Note completed. Patient to pick up in [facility] office tomorrow morning

## 2021-11-06 NOTE — Telephone Encounter (Signed)
Patient needs a note for the time she is being taken out of work until the date he predicts her to go back she is out today and tomorrow to get paperwork complete, she is having surgery Friday and would like the note tomorrow if possible
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
## 2021-11-08 ENCOUNTER — Observation Stay (HOSPITAL_COMMUNITY)
Admission: RE | Admit: 2021-11-08 | Discharge: 2021-11-09 | Disposition: A | Discharge status: Home / Self Care | Payer: BC Managed Care – PPO | Attending: Orthopaedic Surgery | Admitting: Orthopaedic Surgery

## 2021-11-08 ENCOUNTER — Other Ambulatory Visit: Payer: Self-pay

## 2021-11-08 ENCOUNTER — Ambulatory Visit (HOSPITAL_COMMUNITY): Payer: BC Managed Care – PPO | Admitting: Registered Nurse

## 2021-11-08 ENCOUNTER — Encounter (HOSPITAL_COMMUNITY)
Admission: RE | Disposition: A | Discharge status: Home / Self Care | Payer: Self-pay | Source: Home / Self Care | Attending: Orthopaedic Surgery

## 2021-11-08 ENCOUNTER — Ambulatory Visit (HOSPITAL_COMMUNITY): Payer: BC Managed Care – PPO | Admitting: Vascular Surgery

## 2021-11-08 ENCOUNTER — Encounter (HOSPITAL_COMMUNITY): Payer: Self-pay | Admitting: Orthopaedic Surgery

## 2021-11-08 ENCOUNTER — Ambulatory Visit (HOSPITAL_COMMUNITY): Payer: BC Managed Care – PPO

## 2021-11-08 DIAGNOSIS — Z0389 Encounter for observation for other suspected diseases and conditions ruled out: Secondary | ICD-10-CM | POA: Diagnosis not present

## 2021-11-08 DIAGNOSIS — I2510 Atherosclerotic heart disease of native coronary artery without angina pectoris: Secondary | ICD-10-CM | POA: Diagnosis not present

## 2021-11-08 DIAGNOSIS — E039 Hypothyroidism, unspecified: Secondary | ICD-10-CM | POA: Insufficient documentation

## 2021-11-08 DIAGNOSIS — T85848A Pain due to other internal prosthetic devices, implants and grafts, initial encounter: Secondary | ICD-10-CM | POA: Diagnosis present

## 2021-11-08 DIAGNOSIS — Z87891 Personal history of nicotine dependence: Secondary | ICD-10-CM | POA: Insufficient documentation

## 2021-11-08 DIAGNOSIS — T8484XA Pain due to internal orthopedic prosthetic devices, implants and grafts, initial encounter: Secondary | ICD-10-CM | POA: Diagnosis not present

## 2021-11-08 DIAGNOSIS — I10 Essential (primary) hypertension: Secondary | ICD-10-CM | POA: Insufficient documentation

## 2021-11-08 DIAGNOSIS — Z419 Encounter for procedure for purposes other than remedying health state, unspecified: Secondary | ICD-10-CM

## 2021-11-08 DIAGNOSIS — J45909 Unspecified asthma, uncomplicated: Secondary | ICD-10-CM | POA: Diagnosis not present

## 2021-11-08 DIAGNOSIS — I119 Hypertensive heart disease without heart failure: Secondary | ICD-10-CM | POA: Diagnosis not present

## 2021-11-08 DIAGNOSIS — M1731 Unilateral post-traumatic osteoarthritis, right knee: Secondary | ICD-10-CM | POA: Diagnosis not present

## 2021-11-08 DIAGNOSIS — I480 Paroxysmal atrial fibrillation: Secondary | ICD-10-CM | POA: Diagnosis not present

## 2021-11-08 DIAGNOSIS — E119 Type 2 diabetes mellitus without complications: Secondary | ICD-10-CM | POA: Diagnosis not present

## 2021-11-08 DIAGNOSIS — Z01818 Encounter for other preprocedural examination: Secondary | ICD-10-CM

## 2021-11-08 HISTORY — PX: HARDWARE REMOVAL: SHX979

## 2021-11-08 LAB — GLUCOSE, CAPILLARY: Glucose-Capillary: 180 mg/dL — ABNORMAL HIGH (ref 70–99)

## 2021-11-08 SURGERY — REMOVAL, HARDWARE
Anesthesia: General | Laterality: Right

## 2021-11-08 MED ORDER — CHLORHEXIDINE GLUCONATE 4 % EX LIQD: 60.0000 mL | Freq: Once | CUTANEOUS | Status: DC

## 2021-11-08 MED ORDER — FLUTICASONE PROPIONATE 50 MCG/ACT NA SUSP: 2.0000 | Freq: Every day | NASAL | Status: DC | PRN

## 2021-11-08 MED ORDER — MIDAZOLAM HCL 2 MG/2ML IJ SOLN
INTRAMUSCULAR | Status: DC | PRN
  Administered 2021-11-08: 1 mg via INTRAVENOUS

## 2021-11-08 MED ORDER — RIVAROXABAN 10 MG PO TABS: 20.0000 mg | ORAL_TABLET | Freq: Every day | ORAL | Status: DC

## 2021-11-08 MED ORDER — FUROSEMIDE 40 MG PO TABS: 40.0000 mg | ORAL_TABLET | Freq: Every day | ORAL | Status: DC | PRN

## 2021-11-08 MED ORDER — ONDANSETRON HCL 4 MG/2ML IJ SOLN
INTRAMUSCULAR | Status: AC
  Filled 2021-11-08: qty 2

## 2021-11-08 MED ORDER — SPIRONOLACTONE 25 MG PO TABS
25.0000 mg | ORAL_TABLET | Freq: Every day | ORAL | Status: DC
  Administered 2021-11-08 – 2021-11-09 (×2): 25 mg via ORAL
  Filled 2021-11-08 (×2): qty 1

## 2021-11-08 MED ORDER — PANTOPRAZOLE SODIUM 40 MG PO TBEC
40.0000 mg | DELAYED_RELEASE_TABLET | Freq: Every day | ORAL | Status: DC
  Administered 2021-11-09: 40 mg via ORAL
  Filled 2021-11-08: qty 1

## 2021-11-08 MED ORDER — 0.9 % SODIUM CHLORIDE (POUR BTL) OPTIME
TOPICAL | Status: DC | PRN
  Administered 2021-11-08: 500 mL

## 2021-11-08 MED ORDER — ACETAMINOPHEN 325 MG PO TABS: 325.0000 mg | ORAL_TABLET | ORAL | Status: DC | PRN

## 2021-11-08 MED ORDER — IRBESARTAN 300 MG PO TABS
300.0000 mg | ORAL_TABLET | Freq: Every day | ORAL | Status: DC
  Administered 2021-11-08 – 2021-11-09 (×2): 300 mg via ORAL
  Filled 2021-11-08 (×2): qty 1

## 2021-11-08 MED ORDER — FENTANYL CITRATE (PF) 100 MCG/2ML IJ SOLN
25.0000 ug | INTRAMUSCULAR | Status: DC | PRN
  Administered 2021-11-08 (×2): 50 ug via INTRAVENOUS

## 2021-11-08 MED ORDER — ROSUVASTATIN CALCIUM 5 MG PO TABS
5.0000 mg | ORAL_TABLET | Freq: Every morning | ORAL | Status: DC
  Administered 2021-11-08 – 2021-11-09 (×2): 5 mg via ORAL
  Filled 2021-11-08 (×2): qty 1

## 2021-11-08 MED ORDER — LACTATED RINGERS IV SOLN: INTRAVENOUS | Status: DC

## 2021-11-08 MED ORDER — BUPIVACAINE LIPOSOME 1.3 % IJ SUSP
INTRAMUSCULAR | Status: DC | PRN
  Administered 2021-11-08: 10 mL

## 2021-11-08 MED ORDER — DOCUSATE SODIUM 100 MG PO CAPS
100.0000 mg | ORAL_CAPSULE | Freq: Two times a day (BID) | ORAL | Status: DC
  Administered 2021-11-08 – 2021-11-09 (×3): 100 mg via ORAL
  Filled 2021-11-08 (×3): qty 1

## 2021-11-08 MED ORDER — ONDANSETRON HCL 4 MG/2ML IJ SOLN
INTRAMUSCULAR | Status: DC | PRN
  Administered 2021-11-08: 4 mg via INTRAVENOUS

## 2021-11-08 MED ORDER — ACETAMINOPHEN 325 MG PO TABS: 325.0000 mg | ORAL_TABLET | Freq: Four times a day (QID) | ORAL | Status: DC | PRN

## 2021-11-08 MED ORDER — PROPOFOL 10 MG/ML IV BOLUS
INTRAVENOUS | Status: DC | PRN
  Administered 2021-11-08: 50 mg via INTRAVENOUS
  Administered 2021-11-08: 100 mg via INTRAVENOUS

## 2021-11-08 MED ORDER — ONDANSETRON HCL 4 MG/2ML IJ SOLN
4.0000 mg | Freq: Four times a day (QID) | INTRAMUSCULAR | Status: DC | PRN
  Administered 2021-11-08 (×2): 4 mg via INTRAVENOUS
  Filled 2021-11-08 (×2): qty 2

## 2021-11-08 MED ORDER — PROPOFOL 1000 MG/100ML IV EMUL
INTRAVENOUS | Status: AC
  Filled 2021-11-08: qty 100

## 2021-11-08 MED ORDER — LIFITEGRAST 5 % OP SOLN: 1.0000 [drp] | Freq: Two times a day (BID) | OPHTHALMIC | Status: DC | PRN

## 2021-11-08 MED ORDER — POVIDONE-IODINE 10 % EX SWAB
2.0000 | Freq: Once | CUTANEOUS | Status: AC
  Administered 2021-11-08: 2 via TOPICAL

## 2021-11-08 MED ORDER — FENTANYL CITRATE (PF) 100 MCG/2ML IJ SOLN
INTRAMUSCULAR | Status: AC
  Filled 2021-11-08: qty 2

## 2021-11-08 MED ORDER — OXYCODONE HCL 5 MG PO TABS
ORAL_TABLET | ORAL | Status: AC
  Filled 2021-11-08: qty 1

## 2021-11-08 MED ORDER — HYDROCORTISONE (PERIANAL) 2.5 % EX CREA: 1.0000 | TOPICAL_CREAM | Freq: Two times a day (BID) | CUTANEOUS | Status: DC | PRN

## 2021-11-08 MED ORDER — CEFAZOLIN IN SODIUM CHLORIDE 3-0.9 GM/100ML-% IV SOLN
3.0000 g | INTRAVENOUS | Status: AC
  Administered 2021-11-08: 3 g via INTRAVENOUS
  Filled 2021-11-08: qty 100

## 2021-11-08 MED ORDER — OXYCODONE HCL 5 MG/5ML PO SOLN: 5.0000 mg | Freq: Once | ORAL | Status: AC | PRN

## 2021-11-08 MED ORDER — CHLORHEXIDINE GLUCONATE 0.12 % MT SOLN
15.0000 mL | Freq: Once | OROMUCOSAL | Status: AC
  Administered 2021-11-08: 15 mL via OROMUCOSAL
  Filled 2021-11-08: qty 15

## 2021-11-08 MED ORDER — DEXAMETHASONE SODIUM PHOSPHATE 10 MG/ML IJ SOLN
INTRAMUSCULAR | Status: DC | PRN
  Administered 2021-11-08: 5 mg via INTRAVENOUS

## 2021-11-08 MED ORDER — MONTELUKAST SODIUM 10 MG PO TABS
10.0000 mg | ORAL_TABLET | Freq: Every morning | ORAL | Status: DC
  Administered 2021-11-09: 10 mg via ORAL
  Filled 2021-11-08: qty 1

## 2021-11-08 MED ORDER — BUPIVACAINE LIPOSOME 1.3 % IJ SUSP
INTRAMUSCULAR | Status: AC
  Filled 2021-11-08: qty 20

## 2021-11-08 MED ORDER — METHOCARBAMOL 500 MG PO TABS
500.0000 mg | ORAL_TABLET | Freq: Four times a day (QID) | ORAL | Status: DC | PRN
  Administered 2021-11-08: 500 mg via ORAL
  Filled 2021-11-08: qty 1

## 2021-11-08 MED ORDER — ONDANSETRON HCL 4 MG/2ML IJ SOLN: 4.0000 mg | Freq: Once | INTRAMUSCULAR | Status: DC | PRN

## 2021-11-08 MED ORDER — EPINEPHRINE 0.3 MG/0.3ML IJ SOAJ: 0.3000 mg | Freq: Once | INTRAMUSCULAR | Status: DC | PRN

## 2021-11-08 MED ORDER — MIDAZOLAM HCL 2 MG/2ML IJ SOLN
INTRAMUSCULAR | Status: AC
  Filled 2021-11-08: qty 2

## 2021-11-08 MED ORDER — METHOCARBAMOL 1000 MG/10ML IJ SOLN: 500.0000 mg | Freq: Four times a day (QID) | INTRAVENOUS | Status: DC | PRN

## 2021-11-08 MED ORDER — CELECOXIB 200 MG PO CAPS
200.0000 mg | ORAL_CAPSULE | Freq: Once | ORAL | Status: AC
  Administered 2021-11-08: 200 mg via ORAL
  Filled 2021-11-08: qty 1

## 2021-11-08 MED ORDER — METHOCARBAMOL 500 MG PO TABS: 500.0000 mg | ORAL_TABLET | Freq: Four times a day (QID) | ORAL | 0 refills | Status: DC

## 2021-11-08 MED ORDER — LIDOCAINE 2% (20 MG/ML) 5 ML SYRINGE
INTRAMUSCULAR | Status: DC | PRN
  Administered 2021-11-08: 100 mg via INTRAVENOUS

## 2021-11-08 MED ORDER — ACETAMINOPHEN 160 MG/5ML PO SOLN: 325.0000 mg | ORAL | Status: DC | PRN

## 2021-11-08 MED ORDER — FENTANYL CITRATE (PF) 250 MCG/5ML IJ SOLN
INTRAMUSCULAR | Status: DC | PRN
  Administered 2021-11-08 (×3): 50 ug via INTRAVENOUS

## 2021-11-08 MED ORDER — METOPROLOL TARTRATE 50 MG PO TABS
50.0000 mg | ORAL_TABLET | Freq: Two times a day (BID) | ORAL | Status: DC
  Administered 2021-11-08 – 2021-11-09 (×3): 50 mg via ORAL
  Filled 2021-11-08 (×3): qty 1

## 2021-11-08 MED ORDER — PHENYLEPHRINE 80 MCG/ML (10ML) SYRINGE FOR IV PUSH (FOR BLOOD PRESSURE SUPPORT)
PREFILLED_SYRINGE | INTRAVENOUS | Status: DC | PRN
  Administered 2021-11-08 (×6): 160 ug via INTRAVENOUS
  Administered 2021-11-08: 80 ug via INTRAVENOUS

## 2021-11-08 MED ORDER — OXYCODONE HCL 5 MG PO TABS: 5.0000 mg | ORAL_TABLET | ORAL | Status: DC | PRN

## 2021-11-08 MED ORDER — LIDOCAINE 2% (20 MG/ML) 5 ML SYRINGE
INTRAMUSCULAR | Status: AC
  Filled 2021-11-08: qty 5

## 2021-11-08 MED ORDER — METOCLOPRAMIDE HCL 5 MG PO TABS: 5.0000 mg | ORAL_TABLET | Freq: Three times a day (TID) | ORAL | Status: DC | PRN

## 2021-11-08 MED ORDER — METOCLOPRAMIDE HCL 5 MG/ML IJ SOLN: 5.0000 mg | Freq: Three times a day (TID) | INTRAMUSCULAR | Status: DC | PRN

## 2021-11-08 MED ORDER — HYDROMORPHONE HCL 1 MG/ML IJ SOLN
0.5000 mg | INTRAMUSCULAR | Status: DC | PRN
  Administered 2021-11-08 (×2): 0.5 mg via INTRAVENOUS
  Filled 2021-11-08 (×2): qty 0.5

## 2021-11-08 MED ORDER — POLYETHYLENE GLYCOL 3350 17 G PO PACK: 17.0000 g | PACK | Freq: Every day | ORAL | Status: DC | PRN

## 2021-11-08 MED ORDER — DILTIAZEM HCL ER COATED BEADS 360 MG PO CP24
360.0000 mg | ORAL_CAPSULE | Freq: Every evening | ORAL | Status: DC
  Administered 2021-11-08: 360 mg via ORAL
  Filled 2021-11-08 (×3): qty 1

## 2021-11-08 MED ORDER — OXYCODONE HCL 5 MG PO TABS
5.0000 mg | ORAL_TABLET | Freq: Once | ORAL | Status: AC | PRN
  Administered 2021-11-08: 5 mg via ORAL

## 2021-11-08 MED ORDER — OXYCODONE-ACETAMINOPHEN 5-325 MG PO TABS: 1.0000 | ORAL_TABLET | ORAL | 0 refills | Status: DC | PRN

## 2021-11-08 MED ORDER — ORAL CARE MOUTH RINSE: 15.0000 mL | Freq: Once | OROMUCOSAL | Status: AC

## 2021-11-08 MED ORDER — ONDANSETRON HCL 4 MG PO TABS: 4.0000 mg | ORAL_TABLET | Freq: Four times a day (QID) | ORAL | Status: DC | PRN

## 2021-11-08 MED ORDER — SODIUM CHLORIDE 0.9 % IV SOLN: INTRAVENOUS | Status: DC

## 2021-11-08 MED ORDER — BUPIVACAINE HCL (PF) 0.25 % IJ SOLN
INTRAMUSCULAR | Status: AC
  Filled 2021-11-08: qty 10

## 2021-11-08 MED ORDER — BUPIVACAINE HCL 0.25 % IJ SOLN
INTRAMUSCULAR | Status: DC | PRN
  Administered 2021-11-08: 10 mL

## 2021-11-08 MED ORDER — DEXAMETHASONE SODIUM PHOSPHATE 10 MG/ML IJ SOLN
INTRAMUSCULAR | Status: AC
  Filled 2021-11-08: qty 1

## 2021-11-08 MED ORDER — MEPERIDINE HCL 25 MG/ML IJ SOLN: 6.2500 mg | INTRAMUSCULAR | Status: DC | PRN

## 2021-11-08 MED ORDER — ACETAMINOPHEN 500 MG PO TABS
1000.0000 mg | ORAL_TABLET | Freq: Once | ORAL | Status: AC
  Administered 2021-11-08: 1000 mg via ORAL
  Filled 2021-11-08: qty 2

## 2021-11-08 MED ORDER — PHENYLEPHRINE 80 MCG/ML (10ML) SYRINGE FOR IV PUSH (FOR BLOOD PRESSURE SUPPORT)
PREFILLED_SYRINGE | INTRAVENOUS | Status: AC
  Filled 2021-11-08: qty 10

## 2021-11-08 MED ORDER — LUBIPROSTONE 24 MCG PO CAPS
24.0000 ug | ORAL_CAPSULE | ORAL | Status: DC
  Administered 2021-11-09: 24 ug via ORAL
  Filled 2021-11-08: qty 1

## 2021-11-08 MED ORDER — FENTANYL CITRATE (PF) 250 MCG/5ML IJ SOLN
INTRAMUSCULAR | Status: AC
  Filled 2021-11-08: qty 5

## 2021-11-08 SURGICAL SUPPLY — 52 items
BAG COUNTER SPONGE SURGICOUNT (BAG) ×1 IMPLANT
BAG SPNG CNTER NS LX DISP (BAG)
BANDAGE ESMARK 6X9 LF (GAUZE/BANDAGES/DRESSINGS) IMPLANT
BNDG CMPR 9X6 STRL LF SNTH (GAUZE/BANDAGES/DRESSINGS) ×1
BNDG CMPR MED 10X6 ELC LF (GAUZE/BANDAGES/DRESSINGS) ×1
BNDG COHESIVE 4X5 TAN STRL (GAUZE/BANDAGES/DRESSINGS) IMPLANT
BNDG ELASTIC 4X5.8 VLCR STR LF (GAUZE/BANDAGES/DRESSINGS) IMPLANT
BNDG ELASTIC 6X10 VLCR STRL LF (GAUZE/BANDAGES/DRESSINGS) IMPLANT
BNDG ELASTIC 6X5.8 VLCR STR LF (GAUZE/BANDAGES/DRESSINGS) IMPLANT
BNDG ESMARK 6X9 LF (GAUZE/BANDAGES/DRESSINGS) ×1
BNDG GAUZE DERMACEA FLUFF 4 (GAUZE/BANDAGES/DRESSINGS) ×1 IMPLANT
BNDG GZE DERMACEA 4 6PLY (GAUZE/BANDAGES/DRESSINGS) ×1
COVER SURGICAL LIGHT HANDLE (MISCELLANEOUS) ×1 IMPLANT
DRAPE C-ARM 42X72 X-RAY (DRAPES) IMPLANT
DRAPE HALF SHEET 40X57 (DRAPES) IMPLANT
DRAPE INCISE IOBAN 66X45 STRL (DRAPES) IMPLANT
DRAPE ORTHO SPLIT 77X108 STRL (DRAPES)
DRAPE SURG ORHT 6 SPLT 77X108 (DRAPES) IMPLANT
DRSG EMULSION OIL 3X3 NADH (GAUZE/BANDAGES/DRESSINGS) ×1 IMPLANT
DRSG XEROFORM 1X8 (GAUZE/BANDAGES/DRESSINGS) IMPLANT
ELECT REM PT RETURN 9FT ADLT (ELECTROSURGICAL) ×1
ELECTRODE REM PT RTRN 9FT ADLT (ELECTROSURGICAL) ×1 IMPLANT
GAUZE PAD ABD 8X10 STRL (GAUZE/BANDAGES/DRESSINGS) ×1 IMPLANT
GAUZE SPONGE 4X4 12PLY STRL (GAUZE/BANDAGES/DRESSINGS) ×1 IMPLANT
GLOVE BIOGEL PI IND STRL 8 (GLOVE) ×2 IMPLANT
GLOVE ORTHO TXT STRL SZ7.5 (GLOVE) ×2 IMPLANT
GOWN STRL REUS W/ TWL LRG LVL3 (GOWN DISPOSABLE) ×1 IMPLANT
GOWN STRL REUS W/ TWL XL LVL3 (GOWN DISPOSABLE) ×1 IMPLANT
GOWN STRL REUS W/TWL 2XL LVL3 (GOWN DISPOSABLE) ×1 IMPLANT
GOWN STRL REUS W/TWL LRG LVL3 (GOWN DISPOSABLE) ×1
GOWN STRL REUS W/TWL XL LVL3 (GOWN DISPOSABLE) ×1
IMMOBILIZER KNEE 22 UNIV (SOFTGOODS) IMPLANT
KIT BASIN OR (CUSTOM PROCEDURE TRAY) ×1 IMPLANT
KIT TURNOVER KIT B (KITS) ×1 IMPLANT
MANIFOLD NEPTUNE II (INSTRUMENTS) ×1 IMPLANT
NS IRRIG 1000ML POUR BTL (IV SOLUTION) ×1 IMPLANT
PACK GENERAL/GYN (CUSTOM PROCEDURE TRAY) ×1 IMPLANT
PAD ABD 7.5X8 STRL (GAUZE/BANDAGES/DRESSINGS) IMPLANT
PAD ARMBOARD 7.5X6 YLW CONV (MISCELLANEOUS) ×2 IMPLANT
PAD CAST 4YDX4 CTTN HI CHSV (CAST SUPPLIES) ×1 IMPLANT
PADDING CAST COTTON 4X4 STRL (CAST SUPPLIES) ×1
PADDING CAST COTTON 6X4 STRL (CAST SUPPLIES) IMPLANT
STAPLER VISISTAT 35W (STAPLE) ×1 IMPLANT
STOCKINETTE IMPERVIOUS 9X36 MD (GAUZE/BANDAGES/DRESSINGS) IMPLANT
SUT ETHILON 4 0 FS 1 (SUTURE) IMPLANT
SUT VIC AB 0 CT1 27 (SUTURE) ×1
SUT VIC AB 0 CT1 27XBRD ANBCTR (SUTURE) IMPLANT
SUT VIC AB 2-0 CT1 27 (SUTURE) ×1
SUT VIC AB 2-0 CT1 TAPERPNT 27 (SUTURE) IMPLANT
TOWEL GREEN STERILE (TOWEL DISPOSABLE) ×1 IMPLANT
TOWEL GREEN STERILE FF (TOWEL DISPOSABLE) ×1 IMPLANT
WATER STERILE IRR 1000ML POUR (IV SOLUTION) ×1 IMPLANT

## 2021-11-08 NOTE — Discharge Instructions (Signed)
Do not remove dressing or get wet.    Per Dr Lorin Mercy ok to weight bear as tolerated in knee immobilizer only.  Recommend wearing knee immobilizer as much as possible until return office visit with Dr Lorin Mercy.   Elevate foot above heart level as much as possible to decrease swelling and pain.    Ice of and on prn.    No strenuous activity  No driving. Can remove knee immobilizer to work on knee range of motion , bending and lifting leg.

## 2021-11-08 NOTE — Anesthesia Postprocedure Evaluation (Signed)
Anesthesia Post Note  [name]  Procedure(s) Performed: RIGHT KNEE REMOVAL LATERAL TIBIAL PLATEAU PLATE (Right)     Patient location during evaluation: PACU Anesthesia Type: General Level of consciousness: awake and alert Pain management: pain level controlled Vital Signs Assessment: post-procedure vital signs reviewed and stable Respiratory status: spontaneous breathing, nonlabored ventilation, respiratory function stable and patient connected to nasal cannula oxygen Cardiovascular status: blood pressure returned to baseline and stable Postop Assessment: no apparent nausea or vomiting Anesthetic complications: no   There were no known notable events for this encounter.  Last Vitals:  Vitals:   11/08/21 0900 11/08/21 0915  BP: 125/65 132/66  Pulse: 80 78  Resp: (!) 21 19  Temp: 36.5 C   SpO2: 92% 91%    Last Pain:  Vitals:   11/08/21 0915  TempSrc:   PainSc: 9                  Damondre Pfeifle

## 2021-11-08 NOTE — TOC Initial Note (Signed)
Transition of Care [facility]) - Initial/Assessment Note    Patient Details  Name: Becky [name] MRN: [id number] Date of Birth: 01-02-1961  [facility] CM/SW Contact:    Becky Labrum, RN Phone Number: 11/08/2021, 1:13 PM  Clinical Narrative:                 Cm met with the patient at the bedside to discuss transitions of care to home - S/P Removal of Right Tibial plateau plate and screws by Dr. Lorin Mercy..  The patient lives at home alone and plans to have daughter, Becky [name], assist with care when she returns home from the hospital.  The patient has DME at home including RW, CPAP and 3:1.  The patient is pending PT/OT eval post surgery today.  Dr. Lorin Mercy called and spoke with the patient's family by phone after patient's family requested post-surgery update.  PCP - Follow up with Dr. Lorin Mercy.  No needs determined at this time.  PT/OT pending evaluation.  Expected Discharge Plan: Home/Self Care Barriers to Discharge: Continued Medical Work up   Patient Goals and CMS Choice Patient states their goals for this hospitalization and ongoing recovery are:: To return home CMS Medicare.gov Compare Post Acute Care list provided to:: Patient Choice offered to / list presented to : Patient  Expected Discharge Plan and Services Expected Discharge Plan: Home/Self Care   Discharge Planning Services: CM Consult   Living arrangements for the past 2 months: Single Family Home                                      Prior Living Arrangements/Services Living arrangements for the past 2 months: Single Family Home Lives with:: Self (Daughter, Becky [name], plans to stay with the patient to provide care post-operatively at home) Patient language and need for interpreter reviewed:: Yes Do you feel safe going back to the place where you live?: Yes      Need for Family Participation in Patient Care: Yes (Comment) Care giver support system in place?: Yes (comment) Current home services: DME  (CPAP, RW, and 3:1 at home) Criminal Activity/Legal Involvement Pertinent to Current Situation/Hospitalization: No - Comment as needed  Activities of Daily Living Home Assistive Devices/Equipment: Eyeglasses, CPAP, CBG Meter ADL Screening (condition at time of admission) Patient's cognitive ability adequate to safely complete daily activities?: Yes Is the patient deaf or have difficulty hearing?: No Does the patient have difficulty seeing, even when wearing glasses/contacts?: No Does the patient have difficulty concentrating, remembering, or making decisions?: No Patient able to express need for assistance with ADLs?: Yes Does the patient have difficulty dressing or bathing?: No Independently performs ADLs?: Yes (appropriate for developmental age) Does the patient have difficulty walking or climbing stairs?: Yes Weakness of Legs: Both Weakness of Arms/Hands: None  Permission Sought/Granted Permission sought to share information with : Case Manager, Family Supports Permission granted to share information with : Yes, Verbal Permission Granted        Permission granted to share info w Relationship: daughter Becky [name] - [phone]     Emotional Assessment Appearance:: Appears stated age Attitude/Demeanor/Rapport: Gracious Affect (typically observed): Accepting Orientation: : Oriented to Self, Oriented to Place, Oriented to  Time, Oriented to Situation Alcohol / Substance Use: Not Applicable Psych Involvement: No (comment)  Admission diagnosis:  Pain from implanted hardware [T85.848A] Patient Active Problem List   Diagnosis Date Noted   Pain from implanted  hardware 11/08/2021   Post-traumatic osteoarthritis of right knee 11/08/2021   Painful orthopaedic hardware ([place]) 09/19/2021   Prolapsed internal hemorrhoids, grade 3 05/02/2021   LGSIL of cervix of undetermined significance 03/04/2021   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   Vulvar itching 12/07/2020    Prolapsed internal hemorrhoids, grade 2 10/11/2020   Trochanteric bursitis, right hip 10/04/2020   Trochanteric bursitis, left hip 08/02/2020   Unilateral primary osteoarthritis, left knee 08/02/2020   Hemorrhoids 08/01/2020   GI bleeding 07/18/2020   Anemia    Shortness of breath    Rectal bleeding 09/17/2018   Vaginal odor 09/24/2017   Abdominal pain 08/18/2017   Nausea without vomiting 11/17/2016   Current use of long term anticoagulation 11/12/2016   Atrial fibrillation with RVR ([place]) 11/07/2016   Coronary artery disease due to lipid rich plaque    RLQ abdominal pain 10/03/2016   Yeast infection 03/13/2016   Fibroids 03/13/2016   Screening for colorectal cancer 11/28/2015   Burning with urination 11/28/2015   Subacute frontal sinusitis 11/28/2015   Leg cramps 10/31/2015   Chest pain 10/31/2015   Varicose 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 37/94/3276   Body aches 11/21/2014   Constipation 11/21/2014   Vaginal itching 03/24/2014   Vaginal discharge 03/24/2014   Pelvic pain 11/02/2013   Elevated cholesterol 11/02/2013   Low back pain 10/20/2013   Stiffness of ankle joint 10/20/2013   Pain in joint, ankle and foot 10/20/2013   Vaginal irritation 05/23/2013   Hematuria 05/23/2013   BV (bacterial vaginosis) 05/23/2013   HEMATOCHEZIA 12/05/2009   Dysphagia 12/05/2009   CHEST WALL PAIN, ANTERIOR 06/23/2007   LEG PAIN 05/18/2007   VAGINAL PRURITUS 04/16/2007   GOITER 03/10/2007   HYPOTHYROIDISM 03/10/2007   DIABETES MELLITUS, TYPE II 03/10/2007   HYPERLIPIDEMIA 03/10/2007   ANEMIA-IRON DEFICIENCY 03/10/2007   Essential hypertension 03/10/2007   RHINITIS, CHRONIC 03/10/2007   ASTHMA, CHILDHOOD 03/10/2007   Gastroesophageal reflux disease 03/10/2007   PEPTIC ULCER DISEASE 03/10/2007   MENOPAUSAL SYNDROME 03/10/2007   PCP:  Redmond School, MD Pharmacy:   [id number] [id number]: [phone]: [address] [place] [address]Phone: [phone]: [phone]    Social Determinants of Health (SDOH) Interventions    Readmission Risk Interventions     No data to display

## 2021-11-08 NOTE — Op Note (Addendum)
Pre and postop diagnosis: Post posttraumatic right knee osteoarthritis with retained lateral tibial plateau plate.  Procedure: Removal of right tibial plateau plate and screws.  Surgeon: Lorin Mercy MD  Assistant: Benjiman Core, PA-C medically necessary and present for entire procedure  Tourniquet time less than 45 minutes x 300  Implants: None  Explants Synthes lateral tibial plateau plate and screws. Brief history: 61 year old female with posttraumatic knee osteoarthritis who is here for removal of tibial plateau plate and screws so that she can later have total knee arthroplasty in a few months.  Plate and screws are positioned such that total knee arthroplasty cannot be done without hardware removal.  Procedure :after standard prepping draping proximal thigh tourniquet DuraPrep preoperative Ancef prophylaxis general anesthesia impervious stockinette Coban was applied sterile skin marker on the old incision and Betadine Steri-Drape.  Timeout procedure was completed.  Old incision was opened.  Anterior compartment fascia was released off the tibial crest plate was identified and screws removed.  Distal 2 screw holes were covered with bone then partially covered the anterior posterior aspect of the plate had to be chipped off and osteotome.  Approximately the cancellous screws were removed as well as the angled pick screw.  Once all screws removed C-arm was brought in 1 remaining screw was present proximally once it was removed the plate was removed with osteotomes.  Copious irrigation.  Fascia was repaired with #1 Vicryl interrupted 2-0 Vicryl subtenons tissue Marcaine Exparel infiltration temples 10.  10 staple closure postop dressing and knee immobilizer.  Systems was required for exposure of screw heads assisted by Benjiman Core, PA-C.

## 2021-11-08 NOTE — Anesthesia Procedure Notes (Signed)
Procedure Name: LMA Insertion Date/Time: 11/08/2021 7:37 AM  Performed by: Ester Rink, CRNAPre-anesthesia Checklist: Patient identified, Emergency Drugs available, Suction available and Patient being monitored Patient Re-evaluated:Patient Re-evaluated prior to induction Oxygen Delivery Method: Circle system utilized Preoxygenation: Pre-oxygenation with 100% oxygen Induction Type: IV induction Ventilation: Mask ventilation without difficulty LMA: LMA inserted LMA Size: 4.0 Tube type: Oral Number of attempts: 1 Placement Confirmation: ETT inserted through vocal cords under direct vision, positive ETCO2 and breath sounds checked- equal and bilateral Tube secured with: Tape Dental Injury: Teeth and Oropharynx as per pre-operative assessment

## 2021-11-08 NOTE — Anesthesia Postprocedure Evaluation (Signed)
Anesthesia Post Note  [name]  Procedure(s) Performed: RIGHT KNEE REMOVAL LATERAL TIBIAL PLATEAU PLATE (Right)     Patient location during evaluation: PACU Anesthesia Type: General Level of consciousness: awake and alert Pain management: pain level controlled Vital Signs Assessment: post-procedure vital signs reviewed and stable Respiratory status: spontaneous breathing, nonlabored ventilation, respiratory function stable and patient connected to nasal cannula oxygen Cardiovascular status: blood pressure returned to baseline and stable Postop Assessment: no apparent nausea or vomiting Anesthetic complications: no   There were no known notable events for this encounter.  Last Vitals:  Vitals:   11/08/21 0900 11/08/21 0915  BP: 125/65 132/66  Pulse: 80 78  Resp: (!) 21 19  Temp: 36.5 C   SpO2: 92% 91%    Last Pain:  Vitals:   11/08/21 0915  TempSrc:   PainSc: 9                  Osbaldo Mark

## 2021-11-08 NOTE — Interval H&P Note (Signed)
History and Physical Interval Note:  11/08/2021 7:22 AM  Becky [name]  has presented today for surgery, with the diagnosis of right knee painful tibial plateau plate.  The various methods of treatment have been discussed with the patient and family. After consideration of risks, benefits and other options for treatment, the patient has consented to  Procedure(s): RIGHT KNEE REMOVAL LATERAL TIBIAL PLATEAU PLATE (Right) as a surgical intervention.  The patient's history has been reviewed, patient examined, no change in status, stable for surgery.  I have reviewed the patient's chart and labs.  Questions were answered to the patient's satisfaction.     [name]

## 2021-11-08 NOTE — Transfer of Care (Signed)
Immediate Anesthesia Transfer of Care Note  [name]  Procedure(s) Performed: RIGHT KNEE REMOVAL LATERAL TIBIAL PLATEAU PLATE (Right)  Patient Location: PACU  Anesthesia Type:General  Level of Consciousness: drowsy and patient cooperative  Airway & Oxygen Therapy: Patient Spontanous Breathing and Patient connected to face mask oxygen  Post-op Assessment: Report given to RN  Post vital signs: Reviewed and stable  Last Vitals:  Vitals Value Taken Time  BP    Temp    Pulse    Resp    SpO2      Last Pain:  Vitals:   11/08/21 0612  TempSrc:   PainSc: 8       Patients Stated Pain Goal: 1 (62/94/76 [id number])  Complications: There were no known notable events for this encounter.

## 2021-11-08 NOTE — Evaluation (Signed)
Physical Therapy Evaluation Patient Details Name: Becky [name] MRN: [id number] DOB: [birth year] Today's Date: 11/08/2021  History of Present Illness  61 y.o. female s/p removal of lateral tibial plateau plate and screws from Rt knee; 11/08/21  Clinical Impression  Patient is s/p above surgery resulting in functional limitations due to the deficits listed below (see PT Problem List). Min assist for transfer and min guard to ambulate safely with RW, wearing Rt knee immobilizer without evidence of buckling or overt LOB. Requests defer stairs until tomorrow. Lots of family support available at d/c.  Likely benefit from HHPT before cleared for OPPT when appropriate.Patient will benefit from skilled PT to increase their independence and safety with mobility to allow discharge to the venue listed below.          Recommendations for follow up therapy are one component of a multi-disciplinary discharge planning process, led by the attending physician.  Recommendations may be updated based on patient status, additional functional criteria and insurance authorization.  Follow Up Recommendations Follow physician's recommendations for discharge plan and follow up therapies Likely benefit from HHPT until cleared for OPPT for restoration of Rt knee ROM and strength.      Assistance Recommended at Discharge Intermittent Supervision/Assistance  Patient can return home with the following  A little help with walking and/or transfers;A little help with bathing/dressing/bathroom;Assistance with cooking/housework;Help with stairs or ramp for entrance;Assist for transportation    Equipment Recommendations None recommended by PT  Recommendations for Other Services       Functional Status Assessment Patient has had a recent decline in their functional status and demonstrates the ability to make significant improvements in function in a reasonable and predictable amount of time.     Precautions / Restrictions  Precautions Precautions: Fall;Other (comment) Precaution Comments: No resistance exercise for Rt knee Required Braces or Orthoses: Knee Immobilizer - Right Knee Immobilizer - Right: On when out of bed or walking Restrictions Weight Bearing Restrictions: Yes RLE Weight Bearing: Weight bearing as tolerated      Mobility  Bed Mobility Overal bed mobility: Needs Assistance Bed Mobility: Supine to Sit     Supine to sit: Min guard     General bed mobility comments: min guard for safety. Educated on use of LLE to support RLE, use of knee immobilizer until cleared to actively move knee against gravity (no resistance ordered by PA.)    Transfers Overall transfer level: Needs assistance Equipment used: Rolling walker (2 wheels) Transfers: Sit to/from Stand Sit to Stand: Min assist           General transfer comment: Min assist to boost from bed with instructions for UE placement and walker use upon rising. Able to rise with pushing through LLE and min assist.    Ambulation/Gait Ambulation/Gait assistance: Min guard Gait Distance (Feet): 115 Feet Assistive device: Rolling walker (2 wheels) Gait Pattern/deviations: Step-through pattern, Decreased stride length, Decreased stance time - right, Antalgic Gait velocity: decreased Gait velocity interpretation: <1.31 ft/sec, indicative of household ambulator Pre-gait activities: standing weight shift General Gait Details: Educated on safe AD use with RW, knee immobilizer in place. No buckling with device secured, tolerating majority of weight and fair amount of WB through UEs. Min guard for safety, no overt LOB noted.  Stairs            Wheelchair Mobility    Modified Rankin (Stroke Patients Only)       Balance Overall balance assessment: Needs assistance Sitting-balance support: No upper extremity supported, Feet  supported Sitting balance-Leahy Scale: Good     Standing balance support: No upper extremity supported,  During functional activity Standing balance-Leahy Scale: Fair                               Pertinent Vitals/Pain Pain Assessment Pain Assessment: 0-10 Pain Score: 6  Pain Location: Rt knee Pain Descriptors / Indicators: Aching Pain Intervention(s): Monitored during session, Repositioned, Limited activity within patient's tolerance, Premedicated before session    [place] expects to be discharged to:: Private residence Living Arrangements: Spouse/significant other;Children Available Help at Discharge: Family Type of Home: Mobile home Home Access: Stairs to enter Entrance Stairs-Rails: Can reach both Entrance Stairs-Number of Steps: 4-5   Home Layout: One level Home Equipment: Conservation officer, nature (2 wheels);Shower seat;BSC/3in1      Prior Function Prior Level of Function : Independent/Modified Independent             Mobility Comments: reports independent but struggled to walk ADLs Comments: reports independent but struggled to walk     Hand Dominance   Dominant Hand: Right    Extremity/Trunk Assessment   Upper Extremity Assessment Upper Extremity Assessment: Defer to OT evaluation    Lower Extremity Assessment Lower Extremity Assessment: RLE deficits/detail RLE Deficits / Details: Limited assessment due to restrictions. sensation in tact       Communication   Communication: No difficulties  Cognition Arousal/Alertness: Awake/alert Behavior During Therapy: WFL for tasks assessed/performed Overall Cognitive Status: Within Functional Limits for tasks assessed                                          General Comments General comments (skin integrity, edema, etc.): Nauseous end of session - RN notified.    Exercises Other Exercises Other Exercises: Encouraged ankle pumps, isometrics for quads and glutes.   Assessment/Plan    PT Assessment Patient needs continued PT services  PT Problem List Decreased  strength;Decreased range of motion;Decreased activity tolerance;Decreased balance;Decreased mobility;Decreased knowledge of use of DME;Obesity;Pain       PT Treatment Interventions DME instruction;Gait training;Stair training;Functional mobility training;Therapeutic activities;Therapeutic exercise;Balance training;Neuromuscular re-education;Patient/family education    PT Goals (Current goals can be found in the Care Plan section)  Acute Rehab PT Goals Patient Stated Goal: Get rid of pain PT Goal Formulation: With patient Time For Goal Achievement: 11/15/21 Potential to Achieve Goals: Good    Frequency Min 3X/week     Co-evaluation               AM-PAC PT "6 Clicks" Mobility  Outcome Measure Help needed turning from your back to your side while in a flat bed without using bedrails?: None Help needed moving from lying on your back to sitting on the side of a flat bed without using bedrails?: None Help needed moving to and from a bed to a chair (including a wheelchair)?: A Little Help needed standing up from a chair using your arms (e.g., wheelchair or bedside chair)?: A Little Help needed to walk in hospital room?: A Little Help needed climbing 3-5 steps with a railing? : A Lot 6 Click Score: 19    End of Session Equipment Utilized During Treatment: Gait belt;Right knee immobilizer Activity Tolerance: Patient tolerated treatment well Patient left: in bed;with call bell/phone within reach;with bed alarm set;with family/visitor present Nurse Communication: Mobility status;Other (  comment) (Requests nausea med) PT Visit Diagnosis: Other abnormalities of gait and mobility (R26.89);Difficulty in walking, not elsewhere classified (R26.2);Pain Pain - Right/Left: Right Pain - part of body: Knee    Time: 1007-1219 PT Time Calculation (min) (ACUTE ONLY): 29 min   Charges:   PT Evaluation $PT Eval Low Complexity: 1 Low PT Treatments $Gait Training: 8-22 mins        Candie Mile, PT, DPT Physical Therapist Acute Rehabilitation Services [place] 11/08/2021, 3:31 PM

## 2021-11-09 ENCOUNTER — Encounter (HOSPITAL_COMMUNITY): Payer: Self-pay | Admitting: Orthopaedic Surgery

## 2021-11-09 DIAGNOSIS — E039 Hypothyroidism, unspecified: Secondary | ICD-10-CM | POA: Diagnosis not present

## 2021-11-09 DIAGNOSIS — I10 Essential (primary) hypertension: Secondary | ICD-10-CM | POA: Diagnosis not present

## 2021-11-09 DIAGNOSIS — T8484XA Pain due to internal orthopedic prosthetic devices, implants and grafts, initial encounter: Secondary | ICD-10-CM | POA: Diagnosis not present

## 2021-11-09 DIAGNOSIS — M1731 Unilateral post-traumatic osteoarthritis, right knee: Secondary | ICD-10-CM | POA: Diagnosis not present

## 2021-11-09 DIAGNOSIS — I480 Paroxysmal atrial fibrillation: Secondary | ICD-10-CM | POA: Diagnosis not present

## 2021-11-09 DIAGNOSIS — J45909 Unspecified asthma, uncomplicated: Secondary | ICD-10-CM | POA: Diagnosis not present

## 2021-11-09 DIAGNOSIS — Z87891 Personal history of nicotine dependence: Secondary | ICD-10-CM | POA: Diagnosis not present

## 2021-11-09 DIAGNOSIS — E119 Type 2 diabetes mellitus without complications: Secondary | ICD-10-CM | POA: Diagnosis not present

## 2021-11-09 DIAGNOSIS — I2510 Atherosclerotic heart disease of native coronary artery without angina pectoris: Secondary | ICD-10-CM | POA: Diagnosis not present

## 2021-11-09 LAB — CBC
HCT: 32.4 % — ABNORMAL LOW (ref 36.0–46.0)
Hemoglobin: 10.5 g/dL — ABNORMAL LOW (ref 12.0–15.0)
MCH: 30.4 pg (ref 26.0–34.0)
MCHC: 32.4 g/dL (ref 30.0–36.0)
MCV: 93.9 fL (ref 80.0–100.0)
Platelets: 307 10*3/uL (ref 150–400)
RBC: 3.45 MIL/uL — ABNORMAL LOW (ref 3.87–5.11)
RDW: 12.6 % (ref 11.5–15.5)
WBC: 11.3 10*3/uL — ABNORMAL HIGH (ref 4.0–10.5)
nRBC: 0 % (ref 0.0–0.2)

## 2021-11-09 LAB — BASIC METABOLIC PANEL
Anion gap: 6 (ref 5–15)
BUN: 15 mg/dL (ref 6–20)
CO2: 22 mmol/L (ref 22–32)
Calcium: 9.1 mg/dL (ref 8.9–10.3)
Chloride: 106 mmol/L (ref 98–111)
Creatinine, Ser: 1.2 mg/dL — ABNORMAL HIGH (ref 0.44–1.00)
GFR, Estimated: 52 mL/min — ABNORMAL LOW (ref >60)
Glucose, Bld: 179 mg/dL — ABNORMAL HIGH (ref 70–99)
Potassium: 4 mmol/L (ref 3.5–5.1)
Sodium: 134 mmol/L — ABNORMAL LOW (ref 135–145)

## 2021-11-09 NOTE — Progress Notes (Signed)
Physical Therapy Treatment Patient Details Name: Becky [name] MRN: [id number] DOB: 1960-02-10 Today's Date: 11/09/2021   History of Present Illness 61 y.o. female s/p removal of lateral tibial plateau plate and screws from Rt knee; 11/08/21    PT Comments    Pt supine in bed on arrival.  She is moving well.  Ice applied post session to knee and ankle on R side.  Session focused on progressing mobility and negotiating stairs to simulate home entry today.  Pt eager to go home.     Recommendations for follow up therapy are one component of a multi-disciplinary discharge planning process, led by the attending physician.  Recommendations may be updated based on patient status, additional functional criteria and insurance authorization.  Follow Up Recommendations  Follow physician's recommendations for discharge plan and follow up therapies     Assistance Recommended at Discharge Intermittent Supervision/Assistance  Patient can return home with the following A little help with walking and/or transfers;A little help with bathing/dressing/bathroom;Assistance with cooking/housework;Help with stairs or ramp for entrance;Assist for transportation   Equipment Recommendations  None recommended by PT    Recommendations for Other Services       Precautions / Restrictions Precautions Precautions: Fall;Other (comment) Precaution Comments: No resistance exercise for Rt knee Required Braces or Orthoses: Knee Immobilizer - Right Knee Immobilizer - Right: On when out of bed or walking Restrictions Weight Bearing Restrictions: Yes RLE Weight Bearing: Weight bearing as tolerated     Mobility  Bed Mobility Overal bed mobility: Needs Assistance Bed Mobility: Supine to Sit, Sit to Supine     Supine to sit: Supervision Sit to supine: Supervision   General bed mobility comments: Pt able to move oob and back to bed using hooking motion with LLE.  No issues noted.    Transfers Overall  transfer level: Needs assistance Equipment used: Rolling walker (2 wheels) Transfers: Sit to/from Stand Sit to Stand: Supervision           General transfer comment: Cues for sequencing and hand placement.  Adjusted RW height for improved fit.    Ambulation/Gait Ambulation/Gait assistance: Min guard Gait Distance (Feet): 140 Feet Assistive device: Rolling walker (2 wheels) Gait Pattern/deviations: Step-through pattern, Decreased stride length, Decreased stance time - right, Antalgic Gait velocity: decreased     General Gait Details: Educated on safe AD use with RW, knee immobilizer in place. No buckling with device secured, tolerating majority of weight and fair amount of WB through UEs. Min guard for safety, no overt LOB noted.   Stairs Stairs: Yes Stairs assistance: Supervision Stair Management: Two rails Number of Stairs: 4 General stair comments: Cues for sequencing and safety this session.   Wheelchair Mobility    Modified Rankin (Stroke Patients Only)       Balance Overall balance assessment: Needs assistance Sitting-balance support: No upper extremity supported, Feet supported Sitting balance-Leahy Scale: Good       Standing balance-Leahy Scale: Fair                              Cognition Arousal/Alertness: Awake/alert Behavior During Therapy: WFL for tasks assessed/performed Overall Cognitive Status: Within Functional Limits for tasks assessed                                          Exercises Other Exercises Other Exercises: Encouraged ankle  pumps, isometrics for quads and glutes and gentle ROM    General Comments        Pertinent Vitals/Pain Pain Assessment Pain Assessment: 0-10 Pain Score: 6  Pain Descriptors / Indicators: Aching Pain Intervention(s): Monitored during session, Repositioned, Ice applied    Home Living                          Prior Function            PT Goals (current goals  can now be found in the care plan section) Acute Rehab PT Goals Patient Stated Goal: Get rid of pain Potential to Achieve Goals: Good Progress towards PT goals: Progressing toward goals    Frequency    Min 3X/week      PT Plan Current plan remains appropriate    Co-evaluation              AM-PAC PT "6 Clicks" Mobility   Outcome Measure  Help needed turning from your back to your side while in a flat bed without using bedrails?: None Help needed moving from lying on your back to sitting on the side of a flat bed without using bedrails?: None Help needed moving to and from a bed to a chair (including a wheelchair)?: A Little Help needed standing up from a chair using your arms (e.g., wheelchair or bedside chair)?: A Little Help needed to walk in hospital room?: A Little Help needed climbing 3-5 steps with a railing? : A Little 6 Click Score: 20    End of Session Equipment Utilized During Treatment: Gait belt;Right knee immobilizer Activity Tolerance: Patient tolerated treatment well Patient left: in bed;with call bell/phone within reach;with family/visitor present Nurse Communication: Mobility status PT Visit Diagnosis: Other abnormalities of gait and mobility (R26.89);Difficulty in walking, not elsewhere classified (R26.2);Pain Pain - Right/Left: Right Pain - part of body: Knee     Time: 3428-7681 PT Time Calculation (min) (ACUTE ONLY): 26 min  Charges:  $Gait Training: 8-22 mins $Therapeutic Exercise: 8-22 mins                     Erasmo Leventhal , PTA Acute Rehabilitation Services Office [phone]    Becky [name] Becky [name] 11/09/2021, 12:19 PM

## 2021-11-09 NOTE — Progress Notes (Signed)
Patient ID: Becky [name], female   DOB: 08/10/1960, 61 y.o.   MRN: [id number]   Subjective: 1 Day Post-Op Procedure(s) (LRB): RIGHT KNEE REMOVAL LATERAL TIBIAL PLATEAU PLATE (Right) Patient reports pain as mild and moderate.    Objective: Vital signs in last 24 hours: Temp:  [97.6 F (36.4 C)-98.6 F (37 C)] 98.6 F (37 C) (11/04 0824) Pulse Rate:  [57-64] 59 (11/04 0824) Resp:  [17-20] 20 (11/04 0824) BP: (108-122)/(41-68) 115/41 (11/04 0824) SpO2:  [97 %-100 %] 100 % (11/04 0824)  Intake/Output from previous day: 11/03 0701 - 11/04 0700 In: 2431.7 [I.V.:2331.7; IV Piggyback:100] Out: 50 [Blood:50] Intake/Output this shift: No intake/output data recorded.  Recent Labs    11/09/21 0415  HGB 10.5*   Recent Labs    11/09/21 0415  WBC 11.3*  RBC 3.45*  HCT 32.4*  PLT 307   Recent Labs    11/09/21 0415  NA 134*  K 4.0  CL 106  CO2 22  BUN 15  CREATININE 1.20*  GLUCOSE 179*  CALCIUM 9.1   No results for input(s): "LABPT", "INR" in the last 72 hours.  Neurologically intact No results found.  Assessment/Plan: 1 Day Post-Op Procedure(s) (LRB): RIGHT KNEE REMOVAL LATERAL TIBIAL PLATEAU PLATE (Right) Up with therapy, can get to BR on her own. Discharge home office one week. KI is as needed she can remove to work on knee ROM at home.   Becky [name] 11/09/2021, 10:04 AM

## 2021-11-11 ENCOUNTER — Telehealth: Payer: Self-pay | Admitting: Orthopaedic Surgery

## 2021-11-11 NOTE — Telephone Encounter (Signed)
Received $25 cash & auth for [employer]. Patient went to [facility] where [facility]. To [employer].

## 2021-11-13 ENCOUNTER — Ambulatory Visit: Payer: Self-pay | Admitting: *Deleted

## 2021-11-13 ENCOUNTER — Encounter: Payer: BC Managed Care – PPO | Admitting: *Deleted

## 2021-11-13 NOTE — Patient Outreach (Signed)
  Care Coordination   11/13/2021 Name: Becky [name] MRN: [id number] DOB: Jul 25, 1960   Care Coordination Outreach Attempts:  An unsuccessful telephone outreach was attempted for a scheduled appointment today.  Follow Up Plan:  Additional outreach attempts will be made to offer the patient care coordination information and services.   Encounter Outcome:  No Answer  Care Coordination Interventions Activated:  No   Care Coordination Interventions:  No, not indicated    [name], BSN, RN-BC RN Care Coordinator [place]: [phone] Main #: [phone]

## 2021-11-14 ENCOUNTER — Encounter: Payer: Self-pay | Admitting: Orthopaedic Surgery

## 2021-11-14 ENCOUNTER — Ambulatory Visit (INDEPENDENT_AMBULATORY_CARE_PROVIDER_SITE_OTHER): Payer: BC Managed Care – PPO | Admitting: Orthopaedic Surgery

## 2021-11-14 VITALS — Ht 67.5 in | Wt 270.0 lb

## 2021-11-14 DIAGNOSIS — M1731 Unilateral post-traumatic osteoarthritis, right knee: Secondary | ICD-10-CM

## 2021-11-14 MED ORDER — ONDANSETRON HCL 4 MG PO TABS: 4.0000 mg | ORAL_TABLET | Freq: Three times a day (TID) | ORAL | 1 refills | Status: DC | PRN
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
## 2021-11-14 NOTE — Discharge Summary (Addendum)
Patient ID: Becky [name] MRN: [id number] DOB/AGE: 60-May-1962 60 y.o.  Admit date: 11/08/2021 Discharge date: 11/09/2021  Admission Diagnoses:  Principal Problem:   Pain from implanted hardware Active Problems:   Post-traumatic osteoarthritis of right knee   Discharge Diagnoses:  Principal Problem:   Pain from implanted hardware Active Problems:   Post-traumatic osteoarthritis of right knee  status post Procedure(s): RIGHT KNEE REMOVAL LATERAL TIBIAL PLATEAU PLATE  Past Medical History:  Diagnosis Date   Arthritis    Back pain    Body aches 11/21/2014   BV (bacterial vaginosis) 05/23/2013   Constipation 11/21/2014   Dysrhythmia    a fib   Elevated cholesterol 11/02/2013   Fibroids 03/13/2016   Hematuria 05/23/2013   Hyperlipidemia    Hypertension    Hypothyroidism    LGSIL of cervix of undetermined significance 03/04/2021   03/04/21 +HPV 16/other , will get colpo, per ASCCP guidelines, immediate CIN3+risjk is 5.65%   Migraines    PAF (paroxysmal atrial fibrillation) ([place])    a. diagnosed in 11/2016 --> started on Xarelto for anticoagulation   Pelvic pain in female 11/02/2013   Plantar fasciitis of right foot    Sleep apnea    dont use cpap says causes sinus infection   Thyroid disease    Vaginal discharge 03/24/2014   Vaginal irritation 05/23/2013   Vaginal itching 03/24/2014   Vaginal Pap smear, abnormal     Surgeries: Procedure(s): RIGHT KNEE REMOVAL LATERAL TIBIAL PLATEAU PLATE on 56/03/1495   Consultants:   Discharged Condition: Improved  Hospital Course: Becky [name] is an 61 y.o. female who was admitted 11/08/2021 for operative treatment of Pain from implanted hardware. Patient failed conservative treatments (please see the history and physical for the specifics) and had severe unremitting pain that affects sleep, daily activities and work/hobbies. After pre-op clearance, the patient was taken to the operating room on 11/08/2021 and underwent   Procedure(s): RIGHT KNEE REMOVAL LATERAL TIBIAL PLATEAU PLATE.    Patient was given perioperative antibiotics:  Anti-infectives (From admission, onward)    Start     Dose/Rate Route Frequency Ordered Stop   11/08/21 0600  ceFAZolin (ANCEF) IVPB 3g/100 mL premix        3 g 200 mL/hr over 30 Minutes Intravenous On call to O.R. 11/08/21 0545 11/08/21 0810        Patient was given sequential compression devices and early ambulation to prevent DVT.   Patient benefited maximally from hospital stay and there were no complications. At the time of discharge, the patient was urinating/moving their bowels without difficulty, tolerating a regular diet, pain is controlled with oral pain medications and they have been cleared by PT/OT.   Recent vital signs: No data found.   Recent laboratory studies: No results for input(s): "WBC", "HGB", "HCT", "PLT", "NA", "K", "CL", "CO2", "BUN", "CREATININE", "GLUCOSE", "INR", "CALCIUM" in the last 72 hours.  Invalid input(s): "PT", "2"   Discharge Medications:   Allergies as of 11/09/2021       Reactions   Apresoline [hydralazine] Nausea Only   Latex    Other    Band aids discolor skin ekg pads irritate skin    Prednisone Swelling   Patient states that she can take methylprednisolone without complications        Medication List     STOP taking these medications    acetaminophen 500 MG tablet Commonly known as: TYLENOL   dexlansoprazole 60 MG capsule Commonly known as: [place]  these medications    budesonide 32 MCG/ACT nasal spray Commonly known as: RHINOCORT AQUA Place 1 spray into both nostrils daily as needed for rhinitis.   diltiazem 360 MG 24 hr capsule Commonly known as: CARDIZEM CD TAKE 1 CAPSULE BY MOUTH EVERY DAY What changed:  how much to take how to take this when to take this additional instructions   EPINEPHrine 0.3 mg/0.3 mL Soaj injection Commonly known as: EPI-PEN Inject 0.3 mg into the muscle  once as needed for anaphylaxis.   FISH OIL PO Take 1 capsule by mouth 3 (three) times a week.   fluticasone 50 MCG/ACT nasal spray Commonly known as: FLONASE Place 2 sprays into both nostrils daily. What changed:  when to take this reasons to take this   furosemide 40 MG tablet Commonly known as: Lasix Take 1 tablet (40 mg total) by mouth daily as needed.   hydrocortisone 2.5 % rectal cream Commonly known as: Anusol-HC Place 1 Application rectally 2 (two) times daily. What changed:  when to take this reasons to take this   lubiprostone 24 MCG capsule Commonly known as: AMITIZA TAKE 1 CAPSULE (24 MCG TOTAL) BY MOUTH 2 (TWO) TIMES DAILY WITH A MEAL. What changed: when to take this   methocarbamol 500 MG tablet Commonly known as: ROBAXIN Take 1 tablet (500 mg total) by mouth 4 (four) times daily.   metoprolol tartrate 25 MG tablet Commonly known as: LOPRESSOR Take 50 mg by mouth 2 (two) times daily.   metoprolol tartrate 50 MG tablet Commonly known as: LOPRESSOR Take 1 tablet (50 mg total) by mouth 2 (two) times daily.   montelukast 10 MG tablet Commonly known as: SINGULAIR Take 10 mg by mouth in the morning.   olmesartan 40 MG tablet Commonly known as: Benicar Take 1 tablet (40 mg total) by mouth daily.   oxyCODONE-acetaminophen 5-325 MG tablet Commonly known as: PERCOCET/ROXICET Take 1 tablet by mouth every 4 (four) hours as needed for severe pain.   pantoprazole 40 MG tablet Commonly known as: PROTONIX Take 1 tablet (40 mg total) by mouth daily. Take 30 minutes before breakfast   QC TUMERIC COMPLEX PO Take 1,500 mg by mouth 3 (three) times a week.   rivaroxaban 20 MG Tabs tablet Commonly known as: Xarelto Take 1 tablet (20 mg total) by mouth daily.   rosuvastatin 5 MG tablet Commonly known as: CRESTOR Take 5 mg by mouth in the morning. What changed: Another medication with the same name was removed. Continue taking this medication, and follow the  directions you see here.   spironolactone 25 MG tablet Commonly known as: ALDACTONE TAKE 1 TABLET BY MOUTH EVERY DAY   VITAMIN D-3 PO Take 1 tablet by mouth 3 (three) times a week.   Xiidra 5 % Soln Generic drug: Lifitegrast Place 1 drop into both eyes 2 (two) times daily as needed (dry/irritated eyes.).        Diagnostic Studies: DG Knee 1-2 Views Right  Result Date: 11/08/2021 CLINICAL DATA:  Right knee hardware removal EXAM: RIGHT KNEE - 1 VIEW COMPARISON:  Right tibia radiograph dated October 02, 2010 FINDINGS: Fluoroscopic images were obtained intraoperatively and submitted for post operative interpretation. Prior internal fixation of the proximal tibia with fluoroscopic image demonstrating screw removal, 1 image was obtained with 1 seconds of fluoroscopy time and 0.08 mGy. Please see the performing provider's procedural report for further detail. IMPRESSION: Intraoperative fluoroscopic guidance for right tibia hardware removal. Electronically Signed   By: Hosie Poisson.D.  On: 11/08/2021 08:56   DG C-Arm 1-60 Min-No Report  Result Date: 11/08/2021 Fluoroscopy was utilized by the requesting physician.  No radiographic interpretation.       Follow-up Information     [name], MD. Schedule an appointment as soon as possible for a visit today.   Specialty: Orthopedic Surgery Why: need return office visit with Dr Lorin Mercy one week postop.  call to schedule appointment. Contact information: [place] [address] [phone]         Redmond School, MD. Schedule an appointment as soon as possible for a visit.   Specialty: Internal Medicine Contact information: [address] [place] [address] [phone]                 Discharge Plan:  discharge to home  Disposition:     Signed: Benjiman Core  11/14/2021, 11:00 AM

## 2021-11-19 DIAGNOSIS — E119 Type 2 diabetes mellitus without complications: Secondary | ICD-10-CM | POA: Diagnosis not present

## 2021-11-19 DIAGNOSIS — Z6838 Body mass index (BMI) 38.0-38.9, adult: Secondary | ICD-10-CM | POA: Diagnosis not present

## 2021-11-19 DIAGNOSIS — M159 Polyosteoarthritis, unspecified: Secondary | ICD-10-CM | POA: Diagnosis not present

## 2021-11-19 DIAGNOSIS — E6609 Other obesity due to excess calories: Secondary | ICD-10-CM | POA: Diagnosis not present

## 2021-11-19 DIAGNOSIS — I10 Essential (primary) hypertension: Secondary | ICD-10-CM | POA: Diagnosis not present

## 2021-11-21 ENCOUNTER — Encounter: Payer: Self-pay | Admitting: Orthopaedic Surgery

## 2021-11-21 ENCOUNTER — Encounter: Payer: BC Managed Care – PPO | Attending: Internal Medicine | Admitting: Nutrition

## 2021-11-21 ENCOUNTER — Ambulatory Visit (INDEPENDENT_AMBULATORY_CARE_PROVIDER_SITE_OTHER): Payer: BC Managed Care – PPO | Admitting: Orthopaedic Surgery

## 2021-11-21 VITALS — Ht 67.0 in | Wt 264.0 lb

## 2021-11-21 VITALS — Ht 67.0 in | Wt 264.2 lb

## 2021-11-21 DIAGNOSIS — E118 Type 2 diabetes mellitus with unspecified complications: Secondary | ICD-10-CM | POA: Diagnosis not present

## 2021-11-21 DIAGNOSIS — E782 Mixed hyperlipidemia: Secondary | ICD-10-CM | POA: Diagnosis not present

## 2021-11-21 DIAGNOSIS — I10 Essential (primary) hypertension: Secondary | ICD-10-CM | POA: Insufficient documentation

## 2021-11-21 DIAGNOSIS — I2583 Coronary atherosclerosis due to lipid rich plaque: Secondary | ICD-10-CM | POA: Insufficient documentation

## 2021-11-21 DIAGNOSIS — M1731 Unilateral post-traumatic osteoarthritis, right knee: Secondary | ICD-10-CM

## 2021-11-21 DIAGNOSIS — I2510 Atherosclerotic heart disease of native coronary artery without angina pectoris: Secondary | ICD-10-CM | POA: Insufficient documentation
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
## 2021-11-21 NOTE — Progress Notes (Unsigned)
Medical Nutrition Therapy  Appointment Start time:  0830  Appointment End time:  0930  Primary concerns today: DM Type 2, Obesity Referral diagnosis: E11.8, E66.9 Preferred learning style: No preference  Learning readiness: Ready   NUTRITION ASSESSMENT New Pt DM Type 2, Obesity 61 yr old bfemale her for her diabetes and obesity. Wants to lose weight and know what to eat to get her DM under control. Here is with her son, Becky [name]. Had surgery on right leg in the past.. Took a plate and screws out of her right leg and will be doing to do a knee replacement soon. Nov 3rd was when she had her knee surgery. Ortho DM Dr. Lorin Mercy in [place].  Working on diet. Not on any medications right now. Doesn't want to take medications for her Dm or her Obesity. Working on lifestyle changes.  A1C 7.1% Recently Lost  6 lbs. Has cut down on portions, drinking more water. Cut out sodas.  She hasn't been checking blood sugars but a few times per week. BS in the 150's.  She is willing to work on Lifestyle Medicine to reverse her DM and obesity and improve her health overall.    Anthropometrics  Wt Readings from Last 3 Encounters:  11/21/21 264 lb 3.2 oz (119.8 kg)  11/14/21 270 lb (122.5 kg)  11/08/21 270 lb (122.5 kg)   Ht Readings from Last 3 Encounters:  11/21/21 '5\' 7"'$  (1.702 m)  11/14/21 5' 7.5" (1.715 m)  11/08/21 5' 7.5" (1.715 m)   Body mass index is 41.38 kg/m. '@BMIFA'$ @ Facility age limit for growth %iles is 20 years. Facility age limit for growth %iles is 20 years.    Clinical Medical Hx: See chart Medications: see chart Labs:      Latest Ref Rng & Units 11/09/2021    4:15 AM 10/29/2021   10:55 AM 09/13/2021    9:25 AM  CMP  Glucose 70 - 99 mg/dL 179  138  134   BUN 6 - 20 mg/dL '15  11  18   '$ Creatinine 0.44 - 1.00 mg/dL 1.20  1.14  1.06   Sodium 135 - 145 mmol/L 134  138  140   Potassium 3.5 - 5.1 mmol/L 4.0  3.9  3.8   Chloride 98 - 111 mmol/L 106  105  109   CO2 22 - 32  mmol/L '22  23  23   '$ Calcium 8.9 - 10.3 mg/dL 9.1  9.7  8.9   Total Protein 6.5 - 8.1 g/dL   6.5   Total Bilirubin 0.3 - 1.2 mg/dL   0.7   Alkaline Phos 38 - 126 U/L   52   AST 15 - 41 U/L   16   ALT 0 - 44 U/L   23    Lipid Panel     Component Value Date/Time   CHOL 253 (H) 09/13/2021 0925   CHOL 231 (H) 01/01/2017 1139   TRIG 93 09/13/2021 0925   HDL 60 09/13/2021 0925   HDL 57 01/01/2017 1139   CHOLHDL 4.2 09/13/2021 0925   VLDL 19 09/13/2021 0925   LDLCALC 174 (H) 09/13/2021 0925   LDLCALC 154 (H) 01/01/2017 1139   LABVLDL 20 01/01/2017 1139    Notable Signs/Symptoms: None  Lifestyle & Dietary Hx Married and lives with her husband. She and her daughter do a lot of the cooking.   Estimated daily fluid intake: 60 oz Supplements: Omega 3 fish oil Sleep:  8 hrs Stress / self-care: Knee  issues Current average weekly physical activity: ADL  24-Hr Dietary Recall First Meal: 1030 2 Pancakes- syrup, 1  eggs, 1 slice bacon, water Snack:  Third Meal:  6 pm BBQ and fries, Coke Snack: fruited jello, water Beverages: water, soda 1-2 per day.  Estimated Energy Needs Calories: 1200 Carbohydrate: 135g Protein: 90g Fat: 33g   NUTRITION DIAGNOSIS  NB-1.1 Food and nutrition-related knowledge deficit As related to Diabetes Type 2 .  As evidenced by A1C 7.1%.   NUTRITION INTERVENTION  Nutrition education (E-1) on the following topics:  Nutrition and Diabetes education provided on My Plate, CHO counting, meal planning, portion sizes, timing of meals, avoiding snacks between meals unless having a low blood sugar, target ranges for A1C and blood sugars, signs/symptoms and treatment of hyper/hypoglycemia, monitoring blood sugars, taking medications as prescribed, benefits of exercising 30 minutes per day and prevention of complications of DM. Lifestyle Medicine  - Whole Food, Plant Predominant Nutrition is highly recommended: Eat Plenty of vegetables, Mushrooms, fruits, Legumes,  Whole Grains, Nuts, seeds in lieu of processed meats, processed snacks/pastries red meat, poultry, eggs.    -It is better to avoid simple carbohydrates including: Cakes, Sweet Desserts, Ice Cream, Soda (diet and regular), Sweet Tea, Candies, Chips, Cookies, Store Bought Juices, Alcohol in Excess of  1-2 drinks a day, Lemonade,  Artificial Sweeteners, Doughnuts, Coffee Creamers, "Sugar-free" Products, etc, etc.  This is not a complete list.....  Exercise: If you are able: 30 -60 minutes a day ,4 days a week, or 150 minutes a week.  The longer the better.  Combine stretch, strength, and aerobic activities.  If you were told in the past that you have high risk for cardiovascular diseases, you may seek evaluation by your heart doctor prior to initiating moderate to intense exercise programs.   Handouts Provided Include  Lifestyle Medicine S/s of hyper/hypoglycemia Know your numbers.  Learning Style & Readiness for Change Teaching method utilized: Visual & Auditory  Demonstrated degree of understanding via: Teach Back  Barriers to learning/adherence to lifestyle change: none  Goals Established by Pt Goals  Cut out processed foods Increase whole foods, fruits and vegetables. Cook baked and broiled foods Test blood sugars twice a day Drink only water and cut out sodas, juices Lose 1-2 lbs per week Get A1C less than 5.7%   MONITORING & EVALUATION Dietary intake, weekly physical activity, and blood sugars in 1-2 months.  Next Steps  Patient is to work on meal planning and meal prepping.Marland Kitchen

## 2021-11-21 NOTE — Patient Instructions (Signed)
Goals  Cut out processed foods Increase whole foods, fruits and vegetables. Cook baked and broiled foods Test blood sugars twice a day Drink only water and cut out sodas, juices Lose 1-2 lbs per week Get A1C less than 5.7%

## 2021-11-26 ENCOUNTER — Encounter: Payer: Self-pay | Admitting: Nutrition

## 2021-11-27 ENCOUNTER — Telehealth: Payer: Self-pay | Admitting: *Deleted

## 2021-11-27 NOTE — Telephone Encounter (Signed)
PREP Class assessment visit scheduled with patient for 12/04/21 at 1045 at the [facility].

## 2021-11-30 DIAGNOSIS — G4733 Obstructive sleep apnea (adult) (pediatric): Secondary | ICD-10-CM | POA: Diagnosis not present

## 2021-12-04 ENCOUNTER — Encounter: Payer: Self-pay | Admitting: *Deleted
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
## 2021-12-05 ENCOUNTER — Ambulatory Visit (INDEPENDENT_AMBULATORY_CARE_PROVIDER_SITE_OTHER): Payer: BC Managed Care – PPO | Admitting: Orthopaedic Surgery

## 2021-12-05 ENCOUNTER — Encounter: Payer: Self-pay | Admitting: Orthopaedic Surgery

## 2021-12-05 VITALS — Ht 67.0 in | Wt 259.0 lb

## 2021-12-05 DIAGNOSIS — M1712 Unilateral primary osteoarthritis, left knee: Secondary | ICD-10-CM
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
## 2021-12-06 ENCOUNTER — Encounter: Payer: Self-pay | Admitting: *Deleted

## 2021-12-06 ENCOUNTER — Ambulatory Visit: Payer: Self-pay | Admitting: *Deleted

## 2021-12-06 DIAGNOSIS — Z5941 Food insecurity: Secondary | ICD-10-CM

## 2021-12-06 NOTE — Patient Outreach (Signed)
  Care Coordination   Follow Up Visit Note   12/06/2021 Name: Becky [name] MRN: [id number] DOB: 1960/03/18  Becky [name] is a 61 y.o. year old female who sees Redmond School, MD for primary care. I spoke with  Becky [name] by phone today.  What matters to the patients health and wellness today?  Knee pain and su    Goals Addressed               This Visit's Progress     Patient Stated     COMPLETED: Lose weight in effort to have knee surgery (pt-stated)        Care Coordination Interventions: Advised patient to discuss with primary care provider options regarding weight management Provided patient and/or caregiver with contact information about transportation options should she need outpatient PT post surgery (community resource or dietician) Reviewed recommended dietary changes: avoid fad diets, make small/incremental dietary and exercise changes, eat at the table and avoid eating in front of the TV, plan management of cravings, monitor snacking and cravings in food diary Advised patient to discuss plan for post op (home health versus outpatient) with provider Reviewed upcoming surgery date to remove painful hardware, scheduled for 11/3 Confirmed appointment scheduled with dietician for 11/16 Reviewed plan for support in the home post surgery (husband and daughters)      Other     Care Coordination Services        Care Coordination Interventions: Evaluation of current treatment plan related to need for right knee replacement and patient's adherence to plan as established by provider Provided patient and/or caregiver with verbal information about [employer] and other food resources (community resource) Reviewed scheduled/upcoming provider appointments including Dr Lorin Mercy (ortho surgeon). Reviewed office note from yesterday. Care Guide referral for potential for food insecurity, especially after surgery Discussed plans with patient for ongoing care management  follow up and provided patient with direct contact information for care management team Assessed social determinant of health barriers Discussed mobility and ability to perform ADLs . Currently using a cane for ambulation Fall risk screening performed and education on fall prevention provided Assessed family/social support Children help with food and transportation as much as possible but food resources may be needed. Transportation is stable at this time. Assessed pain level and management Has muscle relaxers and narcotic but hasn't had to take them in two weeks. Tylenol is managing pain tolerably. Provided with [facility] telephone number and encouraged to reach out as needed          SDOH assessments and interventions completed:  Yes  SDOH Interventions Today    Flowsheet Row Most Recent Value  SDOH Interventions   Food Insecurity Interventions Other (Comment), AMB Referral  Becky [name] are trying to help]  Housing Interventions Intervention Not Indicated  Transportation Interventions Intervention Not Indicated        Care Coordination Interventions:  Yes, provided   Follow up plan: Follow up call scheduled for 01/09/22    Encounter Outcome:  Pt. Visit Completed   Becky [name], BSN, RN-BC RN Care Coordinator [place]: [phone] Main #: [phone]

## 2021-12-07 ENCOUNTER — Ambulatory Visit
Admission: EM | Admit: 2021-12-07 | Discharge: 2021-12-07 | Disposition: A | Discharge status: Home / Self Care | Payer: BC Managed Care – PPO | Attending: Family Medicine | Admitting: Family Medicine

## 2021-12-07 DIAGNOSIS — J069 Acute upper respiratory infection, unspecified: Secondary | ICD-10-CM | POA: Diagnosis not present

## 2021-12-07 DIAGNOSIS — J4521 Mild intermittent asthma with (acute) exacerbation: Secondary | ICD-10-CM | POA: Diagnosis not present

## 2021-12-07 MED ORDER — PROMETHAZINE-DM 6.25-15 MG/5ML PO SYRP: 5.0000 mL | ORAL_SOLUTION | Freq: Four times a day (QID) | ORAL | 0 refills | Status: DC | PRN

## 2021-12-07 MED ORDER — GUAIFENESIN ER 600 MG PO TB12: 600.0000 mg | ORAL_TABLET | Freq: Two times a day (BID) | ORAL | 0 refills | Status: DC

## 2021-12-07 MED ORDER — METHYLPREDNISOLONE SODIUM SUCC 125 MG IJ SOLR
60.0000 mg | Freq: Once | INTRAMUSCULAR | Status: AC
  Administered 2021-12-07: 60 mg via INTRAMUSCULAR

## 2021-12-07 MED ORDER — ALBUTEROL SULFATE HFA 108 (90 BASE) MCG/ACT IN AERS: 2.0000 | INHALATION_SPRAY | RESPIRATORY_TRACT | 0 refills | Status: DC | PRN
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
## 2021-12-07 NOTE — ED Triage Notes (Signed)
Pt reports she has some sinus congestion , coughing, sneezing, and headache since yesterday. When she coughs pain under her right breast hurts.  Took coricidin and mucin ex but no relief.

## 2021-12-09 ENCOUNTER — Ambulatory Visit: Payer: BC Managed Care – PPO | Admitting: Nurse Practitioner

## 2021-12-09 ENCOUNTER — Telehealth: Payer: Self-pay

## 2021-12-09 NOTE — Telephone Encounter (Signed)
   Telephone encounter was:  Successful.  12/09/2021 Name: Becky [name] MRN: [id number] DOB: [birth year]  Becky [name] is a 61 y.o. year old female who is a primary care patient of [employer], MD . The community resource team was consulted for assistance with [place] guide performed the following interventions: Patient provided with information about care guide support team and interviewed to confirm resource needs.Patient is having financial strain due to surgery and is  on short term disability now. Patient is not getting a full check because she is still out of work. I have added a referral in [place] resources to patient   Follow Up Plan:  No further follow up planned at this time. The patient has been provided with needed resources.    [place], Care Management  [phone] [address], [place], [place] [address] Phone: [phone] Email: [email or web address]
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
## 2021-12-10 ENCOUNTER — Ambulatory Visit: Payer: BC Managed Care – PPO | Admitting: Student
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
## 2021-12-11 ENCOUNTER — Encounter: Payer: Self-pay | Admitting: *Deleted

## 2021-12-11 ENCOUNTER — Encounter: Payer: Self-pay | Admitting: Student

## 2021-12-11 NOTE — Progress Notes (Signed)
[facility] PREP Weekly Session  Patient Details  Name: Becky [name] MRN: [id number] Date of Birth: [birth year] Age: 61 y.o. PCP: Redmond School, MD  Vitals:   12/11/21 1033  Weight: 259 lb (117.5 kg)     [facility] Weekly seesion - 12/10/21 1300       [facility] "PREP" Location   [facility] Location [place]      Weekly Session   Topic Discussed Goal setting and welcome to the program   Introductions, workbooks reviewed, tour of facility.   Classes attended to date [place], [place] 12/11/2021, 10:34 AM

## 2021-12-18 ENCOUNTER — Encounter: Payer: Self-pay | Admitting: *Deleted

## 2021-12-18 NOTE — Progress Notes (Signed)
[facility] PRE[facility] Weekly Session  Patient Details  Name: [name] MRN: [id number] Date of Birth: 09/16/1960 Age: 61 y.o. PCP: Redmond School, MD  Vitals:   12/17/21 1300  Weight: 264 lb 4.8 oz (119.9 kg)     [facility] Weekly seesion - 12/17/21 1300       [facility] "PREP" Location   [facility] "PREP" Location [facility]      Weekly Session   Topic Discussed Other ways to be active;Importance of resistance training   Reviewed national standards for cardiovascular exercise and resistance training   Minutes exercised this week 135 minutes    Classes attended to date [place], [place] 12/18/2021, 10:32 AM

## 2021-12-19 NOTE — Patient Instructions (Addendum)
Please continue using your CPAP regularly. While your insurance requires that you use CPAP at least 4 hours each night on 70% of the nights, I recommend, that you not skip any nights and use it throughout the night if you can. Getting used to CPAP and staying with the treatment long term does take time and patience and discipline. Untreated obstructive sleep apnea when it is moderate to severe can have an adverse impact on cardiovascular health and raise her risk for heart disease, arrhythmias, hypertension, congestive heart failure, stroke and diabetes. Untreated obstructive sleep apnea causes sleep disruption, nonrestorative sleep, and sleep deprivation. This can have an impact on your day to day functioning and cause daytime sleepiness and impairment of cognitive function, memory loss, mood disturbance, and problems focussing. Using CPAP regularly can improve these symptoms.  Continue to work on using CPAP every night. Let me know when you are using CPAP consistently and we will review data.
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
## 2021-12-24 ENCOUNTER — Ambulatory Visit (INDEPENDENT_AMBULATORY_CARE_PROVIDER_SITE_OTHER): Payer: BC Managed Care – PPO | Admitting: Family Medicine

## 2021-12-24 ENCOUNTER — Encounter: Payer: Self-pay | Admitting: Family Medicine

## 2021-12-24 ENCOUNTER — Encounter: Payer: Self-pay | Admitting: *Deleted

## 2021-12-24 VITALS — BP 139/78 | HR 87 | Ht 67.0 in | Wt 265.5 lb

## 2021-12-24 DIAGNOSIS — G4733 Obstructive sleep apnea (adult) (pediatric): Secondary | ICD-10-CM | POA: Diagnosis not present

## 2021-12-24 NOTE — Progress Notes (Signed)
[facility] PREP Weekly Session  Patient Details  Name: Taheerah Guldin MRN: [id number] Date of Birth: [birth year] Age: 61 y.o. PCP: Redmond School, MD  Vitals:   12/24/21 1652  Weight: 262 lb 3.2 oz (118.9 kg)     [facility] Weekly seesion - 12/24/21 1300       [facility] "PREP" Location   [facility] "PREP" Location [facility]      Weekly Session   Topic Discussed Healthy eating tips   Recommended daily intake of salt and sugar. Discussed importance of healthy carbohydrates, proteins and fats.   Minutes exercised this week 120 minutes    Classes attended to date [place] 12/24/2021, 4:53 PM

## 2021-12-29 ENCOUNTER — Other Ambulatory Visit: Payer: Self-pay | Admitting: Family Medicine

## 2022-01-01 ENCOUNTER — Telehealth: Payer: Self-pay | Admitting: Radiology

## 2022-01-01 NOTE — Telephone Encounter (Signed)
Message from Becky [name] office  Pt needs a work note for another 2 weeks out of work .  Dr Lorin Mercy said this was ok when he was here on Thursday   Note entered.

## 2022-01-01 NOTE — Telephone Encounter (Signed)
Unable to refill per protocol, last refill by another provider. Provider and patient are not at practice, will refuse.  Requested Prescriptions  Pending Prescriptions Disp Refills   VENTOLIN HFA 108 (90 Base) MCG/ACT inhaler [Pharmacy Med Name: VENTOLIN HFA 90 MCG INHALER] 18 each     Sig: INHALE 2 PUFFS INTO THE LUNGS EVERY 4 HOURS AS NEEDED FOR WHEEZING OR SHORTNESS OF BREATH.     There is no refill protocol information for this order

## 2022-01-06 ENCOUNTER — Other Ambulatory Visit: Payer: Self-pay | Admitting: Gastroenterology

## 2022-01-07 ENCOUNTER — Emergency Department (HOSPITAL_COMMUNITY)
Admission: EM | Admit: 2022-01-07 | Discharge: 2022-01-07 | Disposition: A | Discharge status: Home / Self Care | Payer: BC Managed Care – PPO | Attending: Emergency Medicine | Admitting: Emergency Medicine

## 2022-01-07 ENCOUNTER — Other Ambulatory Visit: Payer: Self-pay

## 2022-01-07 ENCOUNTER — Encounter: Payer: Self-pay | Admitting: *Deleted

## 2022-01-07 ENCOUNTER — Ambulatory Visit: Payer: BC Managed Care – PPO | Admitting: Gastroenterology

## 2022-01-07 ENCOUNTER — Encounter (HOSPITAL_COMMUNITY): Payer: Self-pay | Admitting: *Deleted

## 2022-01-07 ENCOUNTER — Emergency Department (HOSPITAL_COMMUNITY): Payer: BC Managed Care – PPO

## 2022-01-07 DIAGNOSIS — Z91040 Latex allergy status: Secondary | ICD-10-CM | POA: Insufficient documentation

## 2022-01-07 DIAGNOSIS — Z7901 Long term (current) use of anticoagulants: Secondary | ICD-10-CM | POA: Insufficient documentation

## 2022-01-07 DIAGNOSIS — M79604 Pain in right leg: Secondary | ICD-10-CM | POA: Diagnosis not present

## 2022-01-07 DIAGNOSIS — M79661 Pain in right lower leg: Secondary | ICD-10-CM

## 2022-01-07 NOTE — Discharge Instructions (Signed)
You are seen in the emergency department for right calf pain.  You had an ultrasound that showed no evidence of a blood clot.  Please use a warm compress to the area and gentle stretching.  Follow-up with your orthopedic provider as scheduled in 2 days.  Return to the emergency department if any worsening or concerning symptoms

## 2022-01-07 NOTE — ED Triage Notes (Signed)
Pt is here for right leg pain and swelling since yesterday and is worried about DVT.  No sob.  Surgery to remove hardware from this leg 11/3. No fever

## 2022-01-07 NOTE — Progress Notes (Signed)
[facility] PREP Weekly Session  Patient Details  Name: Zanylah Hardie MRN: [id number] Date of Birth: 1960-12-31 Age: 62 y.o. PCP: Redmond School, MD  Vitals:   01/07/22 1619  Weight: 266 lb 6.4 oz (120.8 kg)     [facility] Weekly seesion - 01/07/22 1600       [facility] "PREP" Location   [facility] "PREP" Location [place]      Weekly Session   Topic Discussed Health habits;Water   Water intake: half body weight in oz or 64 oz unless on fluid restriction, Ways to reduce sugar, sugar demo.   Minutes exercised this week 80 minutes    Classes attended to date [place], [place] 01/07/2022, 4:22 PM

## 2022-01-07 NOTE — ED Provider Notes (Signed)
[facility] EMERGENCY DEPARTMENT Provider Note   CSN: [id number] Arrival date & time: 01/07/22  1435     History {Add pertinent medical, surgical, social history, OB history to HPI:1} Chief Complaint  Patient presents with   Leg Pain    Becky [name] is a 62 y.o. female.  She is here with right calf pain that started 2 days ago.  She does not recall any trauma it just started occurring while she was walking.  She rates the pain as 8 out of 10 and worse with weightbearing and ambulation.  It radiates up and down her calf.  No chest pain or shortness of breath.  She did have recent orthopedic surgery to her right knee and is anticipated for further surgery next month.  Follows with Dr. Lorin Mercy.  She was concerned about a blood clot.  She has noticed some tingling in her foot since the surgery but no change recently.  The history is provided by the patient.  Leg Pain Location:  Leg Time since incident:  2 days Injury: no   Leg location:  R upper leg Pain details:    Quality:  Aching   Severity:  Severe   Onset quality:  Sudden   Timing:  Intermittent   Progression:  Unchanged Chronicity:  New Dislocation: no   Relieved by:  Nothing Worsened by:  Bearing weight Ineffective treatments:  Rest Associated symptoms: numbness   Associated symptoms: no fever and no swelling        Home Medications Prior to Admission medications   Medication Sig Start Date End Date Taking? Authorizing Provider  albuterol (VENTOLIN HFA) 108 (90 Base) MCG/ACT inhaler Inhale 2 puffs into the lungs every 4 (four) hours as needed for wheezing or shortness of breath. 12/07/21   Volney American, PA-C  budesonide (RHINOCORT AQUA) 32 MCG/ACT nasal spray Place 1 spray into both nostrils daily as needed for rhinitis.    [provider]  Cholecalciferol (VITAMIN D-3 PO) Take 1 tablet by mouth 3 (three) times a week.    [provider]  diltiazem (CARDIZEM CD) 360 MG 24 hr capsule TAKE 1  CAPSULE BY MOUTH EVERY DAY Patient taking differently: Take 360 mg by mouth every evening. 08/06/21   Arnoldo Lenis, MD  EPINEPHrine 0.3 mg/0.3 mL IJ SOAJ injection Inject 0.3 mg into the muscle once as needed for anaphylaxis. 04/20/18   [provider]  fluticasone (FLONASE) 50 MCG/ACT nasal spray Place 2 sprays into both nostrils daily. Patient taking differently: Place 2 sprays into both nostrils daily as needed for allergies. 02/02/20   Melynda Ripple, MD  furosemide (LASIX) 40 MG tablet Take 1 tablet (40 mg total) by mouth daily as needed. 09/13/21   Arnoldo Lenis, MD  guaiFENesin (MUCINEX) 600 MG 12 hr tablet Take 1 tablet (600 mg total) by mouth 2 (two) times daily. 12/07/21   Volney American, PA-C  hydrocortisone (ANUSOL-HC) 2.5 % rectal cream Place 1 Application rectally 2 (two) times daily. Patient taking differently: Place 1 Application rectally 2 (two) times daily as needed for hemorrhoids. 07/24/21   Erenest Rasher, PA-C  Lifitegrast Shirley Friar) 5 % SOLN Place 1 drop into both eyes 2 (two) times daily as needed (dry/irritated eyes.).    [provider]  lubiprostone (AMITIZA) 24 MCG capsule TAKE 1 CAPSULE (24 MCG TOTAL) BY MOUTH 2 (TWO) TIMES DAILY WITH A MEAL. Patient taking differently: Take 24 mcg by mouth every other day. 06/12/21   [name],  NP  methocarbamol (ROBAXIN) 500 MG tablet Take 1 tablet (500 mg total) by mouth 4 (four) times daily. 11/08/21   Lanae Crumbly, PA-C  metoprolol tartrate (LOPRESSOR) 25 MG tablet Take 50 mg by mouth 2 (two) times daily.    [provider]  metoprolol tartrate (LOPRESSOR) 50 MG tablet Take 1 tablet (50 mg total) by mouth 2 (two) times daily. 09/26/21   Strader, Fransisco Hertz, PA-C  montelukast (SINGULAIR) 10 MG tablet Take 10 mg by mouth in the morning. 08/25/16   [provider]  olmesartan (BENICAR) 40 MG tablet Take 1 tablet (40 mg total) by mouth daily. 04/06/20   Arnoldo Lenis, MD  Omega-3  Fatty Acids (FISH OIL PO) Take 1 capsule by mouth 3 (three) times a week.    [provider]  ondansetron (ZOFRAN) 4 MG tablet Take 1 tablet (4 mg total) by mouth every 8 (eight) hours as needed for nausea or vomiting. 11/14/21   [name], MD  oxyCODONE-acetaminophen (PERCOCET/ROXICET) 5-325 MG tablet Take 1 tablet by mouth every 4 (four) hours as needed for severe pain. 11/08/21   Lanae Crumbly, PA-C  pantoprazole (PROTONIX) 40 MG tablet TAKE 1 TABLET (40 MG TOTAL) BY MOUTH DAILY TAKE 30 MINUTES BEFORE BREAKFAST 01/07/22   [name], NP  promethazine-dextromethorphan (PROMETHAZINE-DM) 6.25-15 MG/5ML syrup Take 5 mLs by mouth 4 (four) times daily as needed. 12/07/21   Volney American, PA-C  rivaroxaban (XARELTO) 20 MG TABS tablet Take 1 tablet (20 mg total) by mouth daily. 09/16/21   Arnoldo Lenis, MD  rosuvastatin (CRESTOR) 5 MG tablet Take 5 mg by mouth in the morning. 10/15/21   [provider]  spironolactone (ALDACTONE) 25 MG tablet TAKE 1 TABLET BY MOUTH EVERY DAY 03/13/21   Arnoldo Lenis, MD  Turmeric (QC TUMERIC COMPLEX PO) Take 1,500 mg by mouth 3 (three) times a week.    [provider]      Allergies    Apresoline [hydralazine], Latex, Other, and Prednisone    Review of Systems   Review of Systems  Constitutional:  Negative for fever.  Respiratory:  Negative for shortness of breath.   Cardiovascular:  Negative for chest pain.  Skin:  Negative for wound.    Physical Exam Updated Vital Signs BP 114/63   Pulse (!) 54   Temp 97.8 F (36.6 C) (Oral)   Resp 14   SpO2 100%  Physical Exam Vitals and nursing note reviewed.  Constitutional:      General: She is not in acute distress.    Appearance: Normal appearance. She is well-developed.  HENT:     Head: Normocephalic and atraumatic.  Eyes:     Conjunctiva/sclera: Conjunctivae normal.  Cardiovascular:     Rate and Rhythm: Normal rate and regular rhythm.     Heart sounds: No  murmur heard. Pulmonary:     Effort: Pulmonary effort is normal. No respiratory distress.     Breath sounds: Normal breath sounds.  Abdominal:     Palpations: Abdomen is soft.     Tenderness: There is no abdominal tenderness.  Musculoskeletal:        General: Tenderness present.     Cervical back: Neck supple.     Comments: She has no significant knee tenderness on the right.  She has some point tenderness when palpating the right upper calf.  No cords appreciated.  Achilles intact.  Distal pulses intact.  No open wounds.  Skin:  General: Skin is warm and dry.     Capillary Refill: Capillary refill takes less than 2 seconds.  Neurological:     General: No focal deficit present.     Mental Status: She is alert.     Sensory: No sensory deficit.     Motor: No weakness.     ED Results / Procedures / Treatments   Labs (all labs ordered are listed, but only abnormal results are displayed) Labs Reviewed - No data to display  EKG None  Radiology US Venous Img Lower Unilateral Right  Result Date: 01/07/2022 CLINICAL DATA:  Right knee and calf pain for 4 days EXAM: RIGHT LOWER EXTREMITY VENOUS DOPPLER ULTRASOUND TECHNIQUE: Gray-scale sonography with graded compression, as well as color Doppler and duplex ultrasound were performed to evaluate the lower extremity deep venous systems from the level of the common femoral vein and including the common femoral, femoral, profunda femoral, popliteal and calf veins including the posterior tibial, peroneal and gastrocnemius veins when visible. Spectral Doppler was utilized to evaluate flow at rest and with distal augmentation maneuvers in the common femoral, femoral and popliteal veins. COMPARISON:  None Available. FINDINGS: Contralateral Common Femoral Vein: Respiratory phasicity is normal and symmetric with the symptomatic side. No evidence of thrombus. Normal compressibility. Common Femoral Vein: No evidence of thrombus. Normal compressibility,  respiratory phasicity and response to augmentation. Saphenofemoral Junction: No evidence of thrombus. Normal compressibility and flow on color Doppler imaging. Profunda Femoral Vein: No evidence of thrombus. Normal compressibility and flow on color Doppler imaging. Femoral Vein: No evidence of thrombus. Normal compressibility, respiratory phasicity and response to augmentation. Popliteal Vein: No evidence of thrombus. Normal compressibility, respiratory phasicity and response to augmentation. Calf Veins: No evidence of thrombus. Normal compressibility and flow on color Doppler imaging. IMPRESSION: No evidence of deep venous thrombosis. Electronically Signed   By: Jerilynn Mages.  Shick M.D.   On: 01/07/2022 16:04    Procedures Procedures  {Document cardiac monitor, telemetry assessment procedure when appropriate:1}  Medications Ordered in ED Medications - No data to display  ED Course/ Medical Decision Making/ A&P                           Medical Decision Making  This patient complains of ***; this involves an extensive number of treatment Options and is a complaint that carries with it a high risk of complications and morbidity. The differential includes ***  I ordered, reviewed and interpreted labs, which included *** I ordered medication *** and reviewed PMP when indicated. I ordered imaging studies which included *** and I independently    visualized and interpreted imaging which showed *** Additional history obtained from *** Previous records obtained and reviewed *** I consulted *** and discussed lab and imaging findings and discussed disposition.  Cardiac monitoring reviewed, *** Social determinants considered, *** Critical Interventions: ***  After the interventions stated above, I reevaluated the patient and found *** Admission and further testing considered, ***   {Document critical care time when appropriate:1} {Document review of labs and clinical decision tools ie heart score,  Chads2Vasc2 etc:1}  {Document your independent review of radiology images, and any outside records:1} {Document your discussion with family members, caretakers, and with consultants:1} {Document social determinants of health affecting pt's care:1} {Document your decision making why or why not admission, treatments were needed:1} Final Clinical Impression(s) / ED Diagnoses Final diagnoses:  None    Rx / DC Orders ED Discharge Orders  None       

## 2022-01-08 ENCOUNTER — Telehealth: Payer: Self-pay | Admitting: Radiology

## 2022-01-08 ENCOUNTER — Ambulatory Visit: Payer: Self-pay | Admitting: *Deleted

## 2022-01-08 MED ORDER — METHOCARBAMOL 500 MG PO TABS: 500.0000 mg | ORAL_TABLET | Freq: Four times a day (QID) | ORAL | 0 refills | Status: DC

## 2022-01-08 NOTE — Telephone Encounter (Signed)
Fax request sent by pharmacy for refill on methocarbamol '500mg'$  1 po qid #60. Last filled 11/08/2021. OK for refill?

## 2022-01-08 NOTE — Patient Outreach (Signed)
  Care Coordination   01/08/2022 Name: Becky [name] MRN: [id number] DOB: 1960-07-26   Care Coordination Outreach Attempts:  An unsuccessful telephone outreach was attempted for a scheduled appointment today.  Follow Up Plan:  Additional outreach attempts will be made to offer the patient care coordination information and services.   Encounter Outcome:  No Answer   Care Coordination Interventions:  No, not indicated    [name], BSN, RN-BC RN Care Coordinator [place]: [phone] Main #: [phone]

## 2022-01-08 NOTE — Telephone Encounter (Signed)
Sent to pharmacy 

## 2022-01-08 NOTE — Addendum Note (Signed)
Addended by: Meyer Cory on: 01/08/2022 11:52 AM   Modules accepted: Orders

## 2022-01-09 ENCOUNTER — Ambulatory Visit (INDEPENDENT_AMBULATORY_CARE_PROVIDER_SITE_OTHER): Payer: BC Managed Care – PPO | Admitting: Orthopaedic Surgery

## 2022-01-09 ENCOUNTER — Encounter: Payer: Self-pay | Admitting: Orthopaedic Surgery

## 2022-01-09 VITALS — Ht 67.0 in | Wt 266.0 lb

## 2022-01-09 DIAGNOSIS — G4733 Obstructive sleep apnea (adult) (pediatric): Secondary | ICD-10-CM | POA: Diagnosis not present

## 2022-01-09 DIAGNOSIS — M12561 Traumatic arthropathy, right knee: Secondary | ICD-10-CM
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
## 2022-01-14 ENCOUNTER — Encounter: Payer: Self-pay | Admitting: *Deleted

## 2022-01-14 NOTE — Progress Notes (Signed)
[facility] PREP Weekly Session  Patient Details  Name: Becky [name] MRN: [id number] Date of Birth: 1960-03-05 Age: 62 y.o. PCP: Redmond School, MD  Vitals:   01/14/22 1724  Weight: 263 lb (119.3 kg)     [facility] Weekly seesion - 01/14/22 1300       [facility] "PREP" Location   [facility] "PREP" Location [facility]      Weekly Session   Topic Discussed Restaurant Eating    Minutes exercised this week 45 minutes    Classes attended to date [place], [place] 01/14/2022, 5:25 PM

## 2022-01-20 DIAGNOSIS — I700 Atherosclerosis of aorta: Secondary | ICD-10-CM | POA: Diagnosis not present

## 2022-01-20 DIAGNOSIS — Z6839 Body mass index (BMI) 39.0-39.9, adult: Secondary | ICD-10-CM | POA: Diagnosis not present

## 2022-01-20 DIAGNOSIS — I4891 Unspecified atrial fibrillation: Secondary | ICD-10-CM | POA: Diagnosis not present

## 2022-01-20 DIAGNOSIS — I10 Essential (primary) hypertension: Secondary | ICD-10-CM | POA: Diagnosis not present

## 2022-01-20 DIAGNOSIS — M25511 Pain in right shoulder: Secondary | ICD-10-CM | POA: Diagnosis not present

## 2022-01-20 DIAGNOSIS — M159 Polyosteoarthritis, unspecified: Secondary | ICD-10-CM | POA: Diagnosis not present

## 2022-01-20 DIAGNOSIS — E119 Type 2 diabetes mellitus without complications: Secondary | ICD-10-CM | POA: Diagnosis not present

## 2022-01-21 ENCOUNTER — Encounter: Payer: Self-pay | Admitting: *Deleted

## 2022-01-21 ENCOUNTER — Ambulatory Visit: Payer: Self-pay | Admitting: *Deleted

## 2022-01-21 ENCOUNTER — Other Ambulatory Visit: Payer: Self-pay | Admitting: Physician Assistant

## 2022-01-21 DIAGNOSIS — Z5986 Financial insecurity: Secondary | ICD-10-CM

## 2022-01-21 NOTE — Addendum Note (Signed)
Addended by: Ilean China on: 01/21/2022 04:49 PM   Modules accepted: Orders

## 2022-01-21 NOTE — Patient Outreach (Signed)
  Care Coordination   01/21/2022 Name: Becky [name] MRN: [id number] DOB: 08/21/1960   Care Coordination Outreach Attempts:  An unsuccessful telephone outreach was attempted for a scheduled appointment today. 3rd unsuccessful telephone appointment attempt.   Follow Up Plan:  No further outreach attempts will be made at this time. We have been unable to contact the patient to offer or enroll patient in care coordination services  Encounter Outcome:  No Answer   Care Coordination Interventions:  No, not indicated. Left HIPAA compliant voicemail with contact number and request to return call if interested in Care Coordination or resource assistance.     [name], BSN, RN-BC RN Care Coordinator [place] Direct Dial: [phone] Main #: [phone]

## 2022-01-21 NOTE — Patient Outreach (Signed)
Erroneous encounter. Please disregard.

## 2022-01-21 NOTE — Progress Notes (Signed)
[facility] PREP Weekly Session  Patient Details  Name: Becky [name] MRN: [id number] Date of Birth: 1960-01-07 Age: 62 y.o. PCP: Redmond School, MD  Vitals:   01/21/22 2025  Weight: 264 lb 4.8 oz (119.9 kg)     [facility] Weekly seesion - 01/21/22 1300       [facility] "PREP" Location   [facility] "PREP" Location [facility]      Weekly Session   Topic Discussed Stress management and problem solving   Importance of sleep for good health, tips for better sleep, guided meditation.   Minutes exercised this week 145 minutes    Classes attended to date [place], [place] 01/21/2022, 8:26 PM

## 2022-01-21 NOTE — Patient Outreach (Addendum)
  Care Coordination   Follow Up Visit Note   01/21/2022 Name: Lenisha Lacap MRN: [id number] DOB: [birth year]  Adjoa Althouse is a 62 y.o. year old female who sees Redmond School, MD for primary care. I spoke with  [name] by phone today.  What matters to the patients health and wellness today?  Finding community resources, managing blood sugar, and preparing for knee replacement    Goals Addressed             This Visit's Progress    Care Coordination Services       Care Coordination Interventions: Evaluation of current treatment plan related to need for right knee replacement and patient's adherence to plan as established by provider Reviewed scheduled/upcoming provider appointments including Dr Lorin Mercy (ortho surgeon). Surgery is scheduled for a right total knee replacement on 01/29/22. Care Guide referral for utility assistance. Verified patient received information on food resources and is utilizing a food bank.  Discussed plans with patient for ongoing care management follow up and provided patient with direct contact information for care management team Assessed social determinant of health barriers Discussed mobility and ability to perform ADLs . Currently using a cane for ambulation Assessed family/social support Children help with food and transportation as much as possible. She has been out of work and her short-term disability will run out at the beginning of Feb. Her FMLA has ran out and she is taking a personal leave from work. Unsure of job security at this time due to points accumulated for personal leave.  Discussed that husband works and has insurance coverage on them as well, but that her insurance through her employer provides better coverage.  Discussed blood sugar management and dieet Reviewed medications Discussed desire for [facility], but patient is not on insulin. She will talk with provider about potential insurance coverage. Since she has  commercial ins, I'm if they have a requirement for insulin use before they will cover. Currently checking twice daily. Discussed medicaid expansion and reviewed income limites. Currently above the limit. Encouraged patient to apply anyway to see if there is any form of medicaid that she may qualify for.  Advertising copywriter on [employer] with info on how to apply Amb referral to discuss potential need for utility assistance Provided with [facility] telephone number and encouraged to reach out as needed        SDOH assessments and interventions completed:  Yes    SDOH Interventions Today    Flowsheet Row Most Recent Value  SDOH Interventions   Food Insecurity Interventions Other (Comment)  [place] provided information on resources and ptaient is utilizing food banks]  Transportation Interventions Intervention Not Indicated  Utilities Interventions AMB Referral  [concerned about future need due to being out of work. Would like info on resources.]  Physical Activity Interventions Other (Comments)  [unable to increase acitivity level at this time. Scheduled for right total knee replacement on 01/29/22]      Care Coordination Interventions:  Yes, provided   Follow up plan: Follow up call scheduled for 02/11/22    Encounter Outcome:  Pt. Visit Completed   [name], BSN, RN-BC RN Care Coordinator [place]: [phone] Main #: [phone]

## 2022-01-22 ENCOUNTER — Telehealth: Payer: Self-pay

## 2022-01-22 NOTE — Telephone Encounter (Signed)
   Telephone encounter was:  Successful.  01/22/2022 Name: Becky [name] MRN: [id number] DOB: October 23, 1960  [name] is a 62 y.o. year old female who is a primary care patient of Redmond School, MD . The community resource team was consulted for assistance with Financial Difficulties related to Financial Strain  Care guide performed the following interventions: Patient provided with information about care guide support team and interviewed to confirm resource needs.Patient has been out of work pending knee surgery at the end of the month. She's unsure if she'll be able to keep her job. Right now they aren't behind on utilities, but she is concerned that may be a problem soon. Owns their mobile home but rents the lot. Not as concerned about housing right now. I gave patient resources over the phone as well as added a referral in [place] [facility]. I will be mailing resources to patient   Follow Up Plan:  No further follow up planned at this time. The patient has been provided with needed resources.    [place], Care Management  [phone] [address], [place], [place] [address] Phone: [phone] Email: [email or web address]

## 2022-01-23 ENCOUNTER — Encounter: Payer: BC Managed Care – PPO | Admitting: Orthopaedic Surgery

## 2022-01-24 ENCOUNTER — Telehealth: Payer: Self-pay

## 2022-01-24 NOTE — Progress Notes (Signed)
[place] [facility])  [place] Team    01/24/2022  Becky [name] June 15, 1960 [id number]  Reason for referral: Medication Assistance  Referral source:  [place] Team Current insurance:  Commercial   Outreach:  Successful telephone call with Becky [name].  HIPAA identifiers verified.   Subjective:  I spoke to the patient today and she reports she will be losing her insurance in February and expressed concerns of affording medications after that, specifically pantoprazole and diltiazem. I informed the patient that since those medications are generic, she has insurance currently, and recently filled both of those medications, that there is nothing we can do at the present time. I educated her on the programs available for uninsured patients that she will qualify for in February. The patient reported understanding and states she will follow up with me in February to apply to eligible programs.    Plan: Will follow-up in 2 weeks.  Thanks, Reed Breech, [place] [phone]

## 2022-01-24 NOTE — Pre-Procedure Instructions (Signed)
Surgical Instructions    Your procedure is scheduled on Wednesday, January 24th.  Report to [facility] Main Entrance "A" at 10:30 A.M., then check in with the Admitting office.  Call this number if you have problems the morning of surgery:  [phone]  If you have any questions prior to your surgery date call [phone]: Open Monday-Friday 8am-4pm If you experience any cold or flu symptoms such as cough, fever, chills, shortness of breath, etc. between now and your scheduled surgery, please notify us at the above number.     Remember:  Do not eat after midnight the night before your surgery  You may drink clear liquids until 9:30 a.m. the morning of your surgery.   Clear liquids allowed are: Water, Non-Citrus Juices (without pulp), Carbonated Beverages, Clear Tea, Black Coffee Only (NO MILK, CREAM OR POWDERED CREAMER of any kind), and Gatorade.   Enhanced Recovery after Surgery for Orthopedics Enhanced Recovery after Surgery is a protocol used to improve the stress on your body and your recovery after surgery.  Patient Instructions  The day of surgery (if you do NOT have diabetes):  Drink ONE (1) Pre-Surgery Clear Ensure by 9:30 am the morning of surgery   This drink was given to you during your hospital  pre-op appointment visit. Nothing else to drink after completing the  Pre-Surgery Clear Ensure.         If you have questions, please contact your surgeon's office.     Take these medicines the morning of surgery with A SIP OF WATER  amoxicillin-clavulanate (AUGMENTIN)  methocarbamol (ROBAXIN)  metoprolol tartrate (LOPRESSOR)  pantoprazole (PROTONIX)  rosuvastatin (CRESTOR)    Take these medications AS NEEDED: albuterol (VENTOLIN HFA)- Please bring all inhalers with you the day of surgery.  Eye drops fluticasone (FLONASE)  ondansetron (ZOFRAN)  oxyCODONE-acetaminophen (PERCOCET/ROXICET)   Follow your surgeon's instructions on when to stop rivaroxaban (XARELTO).   If no instructions were given by your surgeon then you will need to call the office to get those instructions.    As of today, STOP taking any Aspirin (unless otherwise instructed by your surgeon) Aleve, Naproxen, Ibuprofen, Motrin, Advil, Goody's, BC's, all herbal medications, fish oil, and all vitamins.                     Do NOT Smoke (Tobacco/Vaping) for 24 hours prior to your procedure.  If you use a CPAP at night, you may bring your mask/headgear for your overnight stay.   Contacts, glasses, piercing's, hearing aid's, dentures or partials may not be worn into surgery, please bring cases for these belongings.    For patients admitted to the hospital, discharge time will be determined by your treatment team.   Patients discharged the day of surgery will not be allowed to drive home, and someone needs to stay with them for 24 hours.  SURGICAL WAITING ROOM VISITATION Patients having surgery or a procedure may have no more than 2 support people in the waiting area - these visitors may rotate.   Children under the age of 76 must have an adult with them who is not the patient. If the patient needs to stay at the hospital during part of their recovery, the visitor guidelines for inpatient rooms apply. Pre-op nurse will coordinate an appropriate time for 1 support person to accompany patient in pre-op.  This support person may not rotate.   Please refer to the [facility] website for the visitor guidelines for Inpatients (after your surgery is  over and you are in a regular room).    Special instructions:   [facility]- Preparing For Surgery  Before surgery, you can play an important role. Because skin is not sterile, your skin needs to be as free of germs as possible. You can reduce the number of germs on your skin by washing with CHG (chlorahexidine gluconate) Soap before surgery.  CHG is an antiseptic cleaner which kills germs and bonds with the skin to continue killing germs even after  washing.    Oral Hygiene is also important to reduce your risk of infection.  Remember - BRUSH YOUR TEETH THE MORNING OF SURGERY WITH YOUR REGULAR TOOTHPASTE  Please do not use if you have an allergy to CHG or antibacterial soaps. If your skin becomes reddened/irritated stop using the CHG.  Do not shave (including legs and underarms) for at least 48 hours prior to first CHG shower. It is OK to shave your face.  Please follow these instructions carefully.   Shower the NIGHT BEFORE SURGERY and the MORNING OF SURGERY  If you chose to wash your hair, wash your hair first as usual with your normal shampoo.  After you shampoo, rinse your hair and body thoroughly to remove the shampoo.  Use CHG Soap as you would any other liquid soap. You can apply CHG directly to the skin and wash gently with a scrungie or a clean washcloth.   Apply the CHG Soap to your body ONLY FROM THE NECK DOWN.  Do not use on open wounds or open sores. Avoid contact with your eyes, ears, mouth and genitals (private parts). Wash Face and genitals (private parts)  with your normal soap.   Wash thoroughly, paying special attention to the area where your surgery will be performed.  Thoroughly rinse your body with warm water from the neck down.  DO NOT shower/wash with your normal soap after using and rinsing off the CHG Soap.  Pat yourself dry with a CLEAN TOWEL.  Wear CLEAN PAJAMAS to bed the night before surgery  Place CLEAN SHEETS on your bed the night before your surgery  DO NOT SLEEP WITH PETS.   Day of Surgery: Take a shower with CHG soap. Do not wear jewelry or makeup Do not wear lotions, powders, perfumes, or deodorant. Do not shave 48 hours prior to surgery.  Men may shave face and neck. Do not bring valuables to the hospital.  [facility] is not responsible for any belongings or valuables. Do not wear nail polish, gel polish, artificial nails, or any other type of covering on natural nails (fingers and  toes) If you have artificial nails or gel coating that need to be removed by a nail salon, please have this removed prior to surgery. Artificial nails or gel coating may interfere with anesthesia's ability to adequately monitor your vital signs. Wear Clean/Comfortable clothing the morning of surgery Remember to brush your teeth WITH YOUR REGULAR TOOTHPASTE.   Please read over the following fact sheets that you were given.    If you received a COVID test during your pre-op visit  it is requested that you wear a mask when out in public, stay away from anyone that may not be feeling well and notify your surgeon if you develop symptoms. If you have been in contact with anyone that has tested positive in the last 10 days please notify you surgeon.

## 2022-01-24 NOTE — Telephone Encounter (Signed)
   Telephone encounter was:  Successful.  01/24/2022 Name: Becky [name] MRN: [id number] DOB: 09-02-1960  Becky [name] is a 62 y.o. year old female who is a primary care patient of Redmond School, MD . The community resource team was consulted for assistance with  pharmacy needs   Care guide performed the following interventions: Patient provided with information about care guide support team and interviewed to confirm resource needs.Patient stated she has a coulple medications she cant afford to pay for now that she isnt working due to her upcoming surgery bp medication zilitiazem and pantoprazole '40mg'$    Follow Up Plan:  Care guide will follow up with patient by phone over the next day    [place], [place] Management  [phone] [address], [place], [place] [address] Phone: [phone] Email: [email or web address]

## 2022-01-27 ENCOUNTER — Encounter (HOSPITAL_COMMUNITY)
Admission: RE | Admit: 2022-01-27 | Discharge: 2022-01-27 | Disposition: A | Discharge status: Home / Self Care | Payer: BC Managed Care – PPO | Source: Ambulatory Visit | Attending: Orthopaedic Surgery | Admitting: Orthopaedic Surgery

## 2022-01-27 ENCOUNTER — Other Ambulatory Visit: Payer: Self-pay

## 2022-01-27 ENCOUNTER — Encounter (HOSPITAL_COMMUNITY): Payer: Self-pay

## 2022-01-27 VITALS — BP 134/72 | HR 66 | Temp 98.0°F | Resp 17 | Ht 67.0 in | Wt 263.0 lb

## 2022-01-27 DIAGNOSIS — Z01812 Encounter for preprocedural laboratory examination: Secondary | ICD-10-CM | POA: Insufficient documentation

## 2022-01-27 DIAGNOSIS — I10 Essential (primary) hypertension: Secondary | ICD-10-CM | POA: Insufficient documentation

## 2022-01-27 DIAGNOSIS — E785 Hyperlipidemia, unspecified: Secondary | ICD-10-CM | POA: Insufficient documentation

## 2022-01-27 DIAGNOSIS — Z7902 Long term (current) use of antithrombotics/antiplatelets: Secondary | ICD-10-CM | POA: Insufficient documentation

## 2022-01-27 DIAGNOSIS — I2510 Atherosclerotic heart disease of native coronary artery without angina pectoris: Secondary | ICD-10-CM | POA: Insufficient documentation

## 2022-01-27 DIAGNOSIS — Z87891 Personal history of nicotine dependence: Secondary | ICD-10-CM | POA: Diagnosis not present

## 2022-01-27 DIAGNOSIS — G4733 Obstructive sleep apnea (adult) (pediatric): Secondary | ICD-10-CM | POA: Insufficient documentation

## 2022-01-27 DIAGNOSIS — Z01818 Encounter for other preprocedural examination: Secondary | ICD-10-CM

## 2022-01-27 HISTORY — DX: Gastro-esophageal reflux disease without esophagitis: K21.9

## 2022-01-27 HISTORY — DX: Unspecified asthma, uncomplicated: J45.909

## 2022-01-27 HISTORY — DX: Prediabetes: R73.03

## 2022-01-27 LAB — CBC
HCT: 37.7 % (ref 36.0–46.0)
Hemoglobin: 12.6 g/dL (ref 12.0–15.0)
MCH: 31.1 pg (ref 26.0–34.0)
MCHC: 33.4 g/dL (ref 30.0–36.0)
MCV: 93.1 fL (ref 80.0–100.0)
Platelets: 338 10*3/uL (ref 150–400)
RBC: 4.05 MIL/uL (ref 3.87–5.11)
RDW: 13.1 % (ref 11.5–15.5)
WBC: 12.1 10*3/uL — ABNORMAL HIGH (ref 4.0–10.5)
nRBC: 0 % (ref 0.0–0.2)

## 2022-01-27 LAB — BASIC METABOLIC PANEL
Anion gap: 6 (ref 5–15)
BUN: 15 mg/dL (ref 6–20)
CO2: 26 mmol/L (ref 22–32)
Calcium: 9.3 mg/dL (ref 8.9–10.3)
Chloride: 108 mmol/L (ref 98–111)
Creatinine, Ser: 1.12 mg/dL — ABNORMAL HIGH (ref 0.44–1.00)
GFR, Estimated: 56 mL/min — ABNORMAL LOW (ref >60)
Glucose, Bld: 127 mg/dL — ABNORMAL HIGH (ref 70–99)
Potassium: 4.4 mmol/L (ref 3.5–5.1)
Sodium: 140 mmol/L (ref 135–145)

## 2022-01-27 LAB — SURGICAL PCR SCREEN
MRSA, PCR: NEGATIVE
Staphylococcus aureus: NEGATIVE

## 2022-01-27 NOTE — Progress Notes (Addendum)
PCP - Redmond School, FNP Cardiologist - Carlyle Dolly, MD  PPM/ICD - Denies  Chest x-ray - 12/03/20 EKG - 10/14/21 Stress Test - 11/04/18 ECHO - 04/01/20 Cardiac Cath - 08/03/19  Sleep Study - 04/07/2019 CPAP - yes  DM: Pre-diabetic  Blood Thinner Instructions: Last does of Xarelto 01/26/2022 Aspirin Instructions: N/a  ERAS Protcol - Yes PRE-SURGERY Ensure or G2- G2  COVID TEST- No   Anesthesia review: Yes. Recent Upper respiratory infection. Patient prescribed amoxicillin by PCP, [place]. Patient states she is feeling better after taking antibiotics. Requested records from PCP. Patient not told if she needs cardiac clearance. Patient instructed not to take pseudoephedrine. Karoline Caldwell PA notified at [name] appointment. James to follow up with surgeon.  Patient denies shortness of breath, fever, cough and chest pain at PAT appointment   All instructions explained to the patient, with a verbal understanding of the material. Patient agrees to go over the instructions while at home for a better understanding. Patient also instructed to self quarantine after being tested for COVID-19. The opportunity to ask questions was provided.

## 2022-01-28 ENCOUNTER — Encounter: Payer: Self-pay | Admitting: *Deleted

## 2022-01-28 NOTE — Progress Notes (Signed)
Anesthesia Chart Review:  History includes former smoker (quit 01/07/03), HTN, HLD, afib (11/07/16), hypothyroidism, OSA (uses CPAP), arthritis, migraines, right tibial plateau fracture (s/p ORIF, bone graft 03/20/03). 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 BMI is consistent with morbid obesity.   Last cardiology visit was on 09/26/21 with Ahmed Prima, [place], PA-C. She reported increased palpitations with SOB. EKG showed recurrent rate controlled afib. She was more symptomatic in afib, so Lopressor increased to 50 mg BID, Cardizem continued at 360 mg. Two week follow-up planned an may consider DCCV if persistent afib. (She has been more hesitant to consider ablation. Notes also indicate that she had been considered for a Watchman device in setting of rectal bleeding but this resolved with hemorrhoidal banding.) She had nurse follow-up on 10/14/21 with EKG and had converted back to NSR and was feeling better without chest pain or SOB. No medication changes made. Normal biventricular function, LVEF 60-65% by March 2023 echo. Mild 25% LAD, RCA disease by 2021 LHC.    Patient was recently cleared by cardiology to hold Xarelto prior to removal of right knee lateral tibial plateau plate. Per 10/31/21 note by Laurann Montana, NP, "Per office protocols and pharmacist review  Becky [name] may hold Xarelto 3 days prior to planned procedure."  Patient did subsequently undergo procedure on 71/06/9676 without complication.  Patient reports last dose of Xarelto 01/26/2022.  Patient reported recent episode of sinus congestion and pressure.  States she had several weeks of sinus congestion and headaches and was seen by her PCP Dr. Gerarda Fraction on 01/20/2022 and he prescribed a 10-day course of Augmentin.  She states she has had prior sinus infections that she attributes to use of her CPAP machine.  I did review Dr. Nolon Rod office note from 01/20/2022 which confirmed she was treated for sinusitis; no other concerning symptoms noted.  I  spoke with patient on 01/28/2022 and she reports that she is 90% back to baseline and has minimal symptoms of sinus congestion and occasional mild headache.  She denies any respiratory symptoms, says she does have an albuterol inhaler which she does sometimes use, particularly in the mornings, but not on a daily basis.  I did instruct her to use this on the morning of surgery prior to coming to the hospital.  I did also make Dr. Lorin Mercy aware that the patient is completing a course of antibiotics for sinusitis and he felt okay proceeding.  Preop labs reviewed, unremarkable.  EKG 10/14/2021: Sinus rhythm with premature supraventricular complexes.  Rate 64.  Left posterior fascicular block.  Echo 04/01/21: IMPRESSIONS   1. Left ventricular ejection fraction, by estimation, is 60 to 65%. Left  ventricular ejection fraction by 3D volume is 65 %. The left ventricle has  normal function. The left ventricle has no regional wall motion  abnormalities. There is mild concentric  left ventricular hypertrophy. Left ventricular diastolic parameters were  normal. The average left ventricular global longitudinal strain is -21.6  %. The global longitudinal strain is normal.   2. Right ventricular systolic function is normal. The right ventricular  size is normal.   3. Left atrial size was mildly dilated.   4. The mitral valve is normal in structure. Trivial mitral valve  regurgitation.   5. The aortic valve is tricuspid. There is mild thickening of the aortic  valve. Aortic valve regurgitation is not visualized. Aortic valve  sclerosis is present, with no evidence of aortic valve stenosis.   6. The inferior vena cava is normal in  size with greater than 50%  respiratory variability, suggesting right atrial pressure of 3 mmHg.  - Comparison(s): No significant change from prior study.    CT Cardiac Morphology 04/01/21 (as part of Watchman evaluation): IMPRESSION: 1. The left atrial appendage is a large chicken  wing morphology without thrombus.  2. A 27 mm Watchman FLX device is recommended based on the above landing zone measurements (20.0 mm maximum diameter; 26% compression). 3. There is no thrombus in the left atrial appendage. 4. A mid/mid IAS puncture site is recommended. 5. Optimal deployment angle: RAO 1 CRA 23 6. Normal coronary origin. Right dominance. CAC score of 148, which is 94th percentile for age-, sex-, and race-matched controls.   LHC 08/03/19: Prox RCA to Mid RCA lesion is 25% stenosed. Prox LAD to Mid LAD lesion is 25% stenosed. The left ventricular systolic function is normal. The left ventricular ejection fraction is 55-65% by visual estimate. LV end diastolic pressure is normal. LVEDP 15 mm Hg. There is no aortic valve stenosis.  Continue medical therapy.      Wynonia Musty [facility]/Anesthesiology Phone [phone] 01/28/2022 3:27 PM

## 2022-01-28 NOTE — Progress Notes (Signed)
[employer] PREP Weekly Session  Patient Details  Name: Becky [name] MRN: [id number] Date of Birth: August 26, 1960 Age: 63 y.o. PCP: Redmond School, MD  Vitals:   01/28/22 1130  Weight: 267 lb (121.1 kg)     [facility] Weekly seesion - 01/28/22 1300       [facility] "PREP" Location   [facility] "PREP" Location [facility]      Weekly Session   Topic Discussed Other   Portion size matters, tips for portion control, deceptive food labeling, portion size demo.   Minutes exercised this week 225 minutes    Classes attended to date [place], [place] 01/28/2022, 4:27 PM  [name] has had perfect attendance at [facility] Class. She states her total knee surgery is scheduled for tomorrow. She is half way through the program so this will affect her ability to complete.

## 2022-01-28 NOTE — Anesthesia Preprocedure Evaluation (Addendum)
Anesthesia Evaluation  Patient identified by MRN, date of birth, ID band Patient awake    Reviewed: Allergy & Precautions, NPO status , Patient's Chart, lab work & pertinent test results  Airway Mallampati: III  TM Distance: >3 FB Neck ROM: Full    Dental   Pulmonary asthma , sleep apnea , former smoker   breath sounds clear to auscultation       Cardiovascular hypertension, Pt. on medications + CAD  + dysrhythmias Atrial Fibrillation  Rhythm:Regular Rate:Normal     Neuro/Psych  Headaches    GI/Hepatic PUD,GERD  ,,  Endo/Other  diabetesHypothyroidism    Renal/GU      Musculoskeletal  (+) Arthritis ,    Abdominal   Peds  Hematology negative hematology ROS (+)   Anesthesia Other Findings   Reproductive/Obstetrics                             Anesthesia Physical Anesthesia Plan  ASA: 3  Anesthesia Plan: General   Post-op Pain Management: Tylenol PO (pre-op)* and Regional block*   Induction: Intravenous  PONV Risk Score and Plan: 3 and Dexamethasone, Ondansetron, Midazolam and Treatment may vary due to age or medical condition  Airway Management Planned: LMA  Additional Equipment:   Intra-op Plan:   Post-operative Plan: Extubation in OR  Informed Consent: Becky [name] have reviewed the patients History and Physical, chart, labs and discussed the procedure including the risks, benefits and alternatives for the proposed anesthesia with the patient or authorized representative who has indicated his/Becky [name] understanding and acceptance.     Dental advisory given  Plan Discussed with: CRNA  Anesthesia Plan Comments: (PAT note by Antionette Poles, PA-C:  History includes former smoker (quit 01/07/03), HTN, HLD, afib (11/07/16), hypothyroidism, OSA (uses CPAP), arthritis, migraines, right tibial plateau fracture (s/p ORIF, bone graft 03/20/03). 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 BMI is consistent with  morbid obesity.  Last cardiology visit was on 09/26/21 with [place] Ouch, [place], PA-C. Becky [name] reported increased palpitations with SOB. EKG showed recurrent rate controlled afib. Becky [name] was more symptomatic in afib, so Lopressor increased to 50 mg BID, Cardizem continued at 360 mg. Two week follow-up planned an may consider DCCV if persistent afib. (Becky [name] has been more hesitant to consider ablation. Notes also indicate that Becky [name] had been considered for a Watchman device in setting of rectal bleeding but this resolved with hemorrhoidal banding.) Becky [name] had nurse follow-up on 10/14/21 with EKG and had converted back to NSR and was feeling better without chest pain or SOB. No medication changes made.Normal biventricular function, LVEF 60-65% by March 2023 echo. Mild 25% LAD, RCA disease by 2021 LHC.   Patient was recently cleared by cardiology to hold Xarelto prior to removal of right knee lateral tibial plateau plate. Per 10/31/21 note by Gillian Shields, NP, "Per office protocols and pharmacist reviewLillian Blackwellmay hold Xarelto 3 days prior to planned procedure."  Patient did subsequently undergo procedure on 11/08/2021 without complication.  Patient reports last dose of Xarelto 01/26/2022.  Patient reported recent episode of sinus congestion and pressure.  States Becky [name] had several weeks of sinus congestion and headaches and was seen by Becky [name] PCP Dr. Sherwood Gambler on 01/20/2022 and he prescribed a 10-day course of Augmentin.  Becky [name] states Becky [name] has had prior sinus infections that Becky [name] attributes to use of Becky [name] CPAP machine.  Becky [name] did review Dr. Sharyon Medicus office note from 01/20/2022 which confirmed Becky [name] was treated for sinusitis; no other concerning  symptoms noted.  Becky [name] spoke with patient on 01/28/2022 and Becky [name] reports that Becky [name] is 90% back to baseline and has minimal symptoms of sinus congestion and occasional mild headache.  Becky [name] denies any respiratory symptoms, says Becky [name] does have an albuterol inhaler which Becky [name] does sometimes use, particularly in  the mornings, but not on a daily basis.  Becky [name] did instruct Becky [name] to use this on the morning of surgery prior to coming to the hospital.  Becky [name] did also make Dr. Ophelia Charter aware that the patient is completing a course of antibiotics for sinusitis and he felt okay proceeding.  Preop labs reviewed, unremarkable.  EKG 10/14/2021: Sinus rhythm with premature supraventricular complexes.  Rate 64.  Left posterior fascicular block.  Echo 04/01/21: IMPRESSIONS  1. Left ventricular ejection fraction, by estimation, is 60 to 65%. Left  ventricular ejection fraction by 3D volume is 65 %. The left ventricle has  normal function. The left ventricle has no regional wall motion  abnormalities. There is mild concentric  left ventricular hypertrophy. Left ventricular diastolic parameters were  normal. The average left ventricular global longitudinal strain is -21.6  %. The global longitudinal strain is normal.  2. Right ventricular systolic function is normal. The right ventricular  size is normal.  3. Left atrial size was mildly dilated.  4. The mitral valve is normal in structure. Trivial mitral valve  regurgitation.  5. The aortic valve is tricuspid. There is mild thickening of the aortic  valve. Aortic valve regurgitation is not visualized. Aortic valve  sclerosis is present, with no evidence of aortic valve stenosis.  6. The inferior vena cava is normal in size with greater than 50%  respiratory variability, suggesting right atrial pressure of 3 mmHg.  -Comparison(s): No significant change from prior study.  CT Cardiac Morphology 04/01/21 (as part of Watchman evaluation): IMPRESSION: 1. The left atrial appendage is a large chicken wing morphology without thrombus. 2. A 27 mm Watchman FLX device is recommended based on the above landing zone measurements (20.0 mm maximum diameter; 26% compression). 3. There is no thrombus in the left atrial appendage. 4. A mid/mid IAS puncture site is recommended. 5.  Optimal deployment angle: RAO 1 CRA 23 6. Normal coronary origin. Right dominance. CAC score of 148, which is 94th percentile for age-, sex-, and race-matched controls.  LHC 08/03/19: ? Prox RCA to Mid RCA lesion is 25% stenosed. ? Prox LAD to Mid LAD lesion is 25% stenosed. ? The left ventricular systolic function is normal. ? The left ventricular ejection fraction is 55-65% by visual estimate. ? LV end diastolic pressure is normal. LVEDP 15 mm Hg. ? There is no aortic valve stenosis. Continue medical therapy.    Antionette Poles, PA-C  )        Anesthesia Quick Evaluation

## 2022-01-29 ENCOUNTER — Ambulatory Visit (HOSPITAL_COMMUNITY): Payer: BC Managed Care – PPO | Admitting: Physician Assistant

## 2022-01-29 ENCOUNTER — Encounter (HOSPITAL_COMMUNITY)
Admission: RE | Disposition: A | Discharge status: Home Health | Payer: Self-pay | Source: Ambulatory Visit | Attending: Orthopaedic Surgery

## 2022-01-29 ENCOUNTER — Observation Stay (HOSPITAL_COMMUNITY)
Admission: RE | Admit: 2022-01-29 | Discharge: 2022-01-31 | Disposition: A | Discharge status: Home Health | Payer: BC Managed Care – PPO | Source: Ambulatory Visit | Attending: Orthopaedic Surgery | Admitting: Orthopaedic Surgery

## 2022-01-29 ENCOUNTER — Encounter (HOSPITAL_COMMUNITY): Payer: Self-pay | Admitting: Orthopaedic Surgery

## 2022-01-29 DIAGNOSIS — M1731 Unilateral post-traumatic osteoarthritis, right knee: Principal | ICD-10-CM | POA: Insufficient documentation

## 2022-01-29 DIAGNOSIS — G8918 Other acute postprocedural pain: Secondary | ICD-10-CM | POA: Diagnosis not present

## 2022-01-29 DIAGNOSIS — E119 Type 2 diabetes mellitus without complications: Secondary | ICD-10-CM | POA: Diagnosis not present

## 2022-01-29 DIAGNOSIS — E039 Hypothyroidism, unspecified: Secondary | ICD-10-CM | POA: Diagnosis not present

## 2022-01-29 DIAGNOSIS — I2510 Atherosclerotic heart disease of native coronary artery without angina pectoris: Secondary | ICD-10-CM | POA: Diagnosis not present

## 2022-01-29 DIAGNOSIS — M1711 Unilateral primary osteoarthritis, right knee: Secondary | ICD-10-CM | POA: Diagnosis not present

## 2022-01-29 DIAGNOSIS — Z91040 Latex allergy status: Secondary | ICD-10-CM | POA: Diagnosis not present

## 2022-01-29 DIAGNOSIS — I10 Essential (primary) hypertension: Secondary | ICD-10-CM | POA: Diagnosis not present

## 2022-01-29 DIAGNOSIS — J45909 Unspecified asthma, uncomplicated: Secondary | ICD-10-CM | POA: Diagnosis not present

## 2022-01-29 DIAGNOSIS — Z87891 Personal history of nicotine dependence: Secondary | ICD-10-CM | POA: Diagnosis not present

## 2022-01-29 DIAGNOSIS — I480 Paroxysmal atrial fibrillation: Secondary | ICD-10-CM | POA: Diagnosis not present

## 2022-01-29 DIAGNOSIS — Z96651 Presence of right artificial knee joint: Secondary | ICD-10-CM

## 2022-01-29 HISTORY — PX: TOTAL KNEE ARTHROPLASTY: SHX125

## 2022-01-29 LAB — GLUCOSE, CAPILLARY
Glucose-Capillary: 117 mg/dL — ABNORMAL HIGH (ref 70–99)
Glucose-Capillary: 109 mg/dL — ABNORMAL HIGH (ref 70–99)
Glucose-Capillary: 125 mg/dL — ABNORMAL HIGH (ref 70–99)

## 2022-01-29 SURGERY — ARTHROPLASTY, KNEE, TOTAL
Anesthesia: Regional | Site: Knee | Laterality: Right

## 2022-01-29 MED ORDER — EPHEDRINE 5 MG/ML INJ
INTRAVENOUS | Status: AC
  Filled 2022-01-29: qty 5

## 2022-01-29 MED ORDER — BISACODYL 10 MG RE SUPP: 10.0000 mg | Freq: Every day | RECTAL | Status: DC | PRN

## 2022-01-29 MED ORDER — BUPIVACAINE LIPOSOME 1.3 % IJ SUSP
INTRAMUSCULAR | Status: DC | PRN
  Administered 2022-01-29: 50 mL

## 2022-01-29 MED ORDER — MONTELUKAST SODIUM 10 MG PO TABS
10.0000 mg | ORAL_TABLET | Freq: Every day | ORAL | Status: DC
  Administered 2022-01-29 – 2022-01-30 (×2): 10 mg via ORAL
  Filled 2022-01-29 (×2): qty 1

## 2022-01-29 MED ORDER — LACTATED RINGERS IV SOLN: INTRAVENOUS | Status: DC

## 2022-01-29 MED ORDER — HYDROMORPHONE HCL 1 MG/ML IJ SOLN
0.5000 mg | INTRAMUSCULAR | Status: DC | PRN
  Administered 2022-01-29: 1 mg via INTRAVENOUS
  Filled 2022-01-29 (×2): qty 1

## 2022-01-29 MED ORDER — METOCLOPRAMIDE HCL 5 MG PO TABS
5.0000 mg | ORAL_TABLET | Freq: Three times a day (TID) | ORAL | Status: DC | PRN
  Administered 2022-01-31: 10 mg via ORAL
  Filled 2022-01-29: qty 2

## 2022-01-29 MED ORDER — MIDAZOLAM HCL 2 MG/2ML IJ SOLN: 1.0000 mg | Freq: Once | INTRAMUSCULAR | Status: AC

## 2022-01-29 MED ORDER — HYDROCORTISONE (PERIANAL) 2.5 % EX CREA: 1.0000 | TOPICAL_CREAM | Freq: Two times a day (BID) | CUTANEOUS | Status: DC

## 2022-01-29 MED ORDER — CLONIDINE HCL (ANALGESIA) 100 MCG/ML EP SOLN
EPIDURAL | Status: DC | PRN
  Administered 2022-01-29: 50 ug

## 2022-01-29 MED ORDER — NAPHAZOLINE-GLYCERIN 0.012-0.25 % OP SOLN: 1.0000 [drp] | Freq: Every day | OPHTHALMIC | Status: DC | PRN

## 2022-01-29 MED ORDER — AMISULPRIDE (ANTIEMETIC) 5 MG/2ML IV SOLN: 10.0000 mg | Freq: Once | INTRAVENOUS | Status: DC | PRN

## 2022-01-29 MED ORDER — ORAL CARE MOUTH RINSE: 15.0000 mL | Freq: Once | OROMUCOSAL | Status: AC

## 2022-01-29 MED ORDER — FENTANYL CITRATE (PF) 250 MCG/5ML IJ SOLN
INTRAMUSCULAR | Status: DC | PRN
  Administered 2022-01-29: 25 ug via INTRAVENOUS
  Administered 2022-01-29 (×4): 50 ug via INTRAVENOUS
  Administered 2022-01-29: 25 ug via INTRAVENOUS

## 2022-01-29 MED ORDER — TRANEXAMIC ACID-NACL 1000-0.7 MG/100ML-% IV SOLN
1000.0000 mg | INTRAVENOUS | Status: AC
  Administered 2022-01-29: 1000 mg via INTRAVENOUS
  Filled 2022-01-29: qty 100

## 2022-01-29 MED ORDER — MIDAZOLAM HCL 2 MG/2ML IJ SOLN
INTRAMUSCULAR | Status: AC
  Administered 2022-01-29: 1 mg via INTRAVENOUS
  Filled 2022-01-29: qty 2

## 2022-01-29 MED ORDER — ONDANSETRON HCL 4 MG PO TABS: 4.0000 mg | ORAL_TABLET | Freq: Three times a day (TID) | ORAL | Status: DC | PRN

## 2022-01-29 MED ORDER — FERROUS SULFATE 325 (65 FE) MG PO TABS
325.0000 mg | ORAL_TABLET | Freq: Three times a day (TID) | ORAL | Status: DC
  Administered 2022-01-29 – 2022-01-31 (×5): 325 mg via ORAL
  Filled 2022-01-29 (×5): qty 1

## 2022-01-29 MED ORDER — ONDANSETRON HCL 4 MG/2ML IJ SOLN
INTRAMUSCULAR | Status: AC
  Filled 2022-01-29: qty 2

## 2022-01-29 MED ORDER — DOCUSATE SODIUM 100 MG PO CAPS
100.0000 mg | ORAL_CAPSULE | Freq: Two times a day (BID) | ORAL | Status: DC
  Administered 2022-01-29 – 2022-01-31 (×4): 100 mg via ORAL
  Filled 2022-01-29 (×4): qty 1

## 2022-01-29 MED ORDER — ACETAMINOPHEN 500 MG PO TABS
1000.0000 mg | ORAL_TABLET | Freq: Four times a day (QID) | ORAL | Status: AC
  Administered 2022-01-29 – 2022-01-30 (×3): 1000 mg via ORAL
  Filled 2022-01-29 (×3): qty 2

## 2022-01-29 MED ORDER — DIPHENHYDRAMINE HCL 12.5 MG/5ML PO ELIX: 12.5000 mg | ORAL_SOLUTION | ORAL | Status: DC | PRN

## 2022-01-29 MED ORDER — FENTANYL CITRATE (PF) 100 MCG/2ML IJ SOLN: 50.0000 ug | Freq: Once | INTRAMUSCULAR | Status: AC

## 2022-01-29 MED ORDER — ACETAMINOPHEN 500 MG PO TABS
1000.0000 mg | ORAL_TABLET | Freq: Once | ORAL | Status: AC
  Administered 2022-01-29: 1000 mg via ORAL
  Filled 2022-01-29: qty 2

## 2022-01-29 MED ORDER — LUBIPROSTONE 24 MCG PO CAPS
24.0000 ug | ORAL_CAPSULE | Freq: Two times a day (BID) | ORAL | Status: DC
  Administered 2022-01-30 (×2): 24 ug via ORAL
  Filled 2022-01-29 (×4): qty 1

## 2022-01-29 MED ORDER — METHOCARBAMOL 500 MG PO TABS
500.0000 mg | ORAL_TABLET | Freq: Four times a day (QID) | ORAL | Status: DC | PRN
  Administered 2022-01-30 – 2022-01-31 (×4): 500 mg via ORAL
  Filled 2022-01-29 (×4): qty 1

## 2022-01-29 MED ORDER — LIFITEGRAST 5 % OP SOLN: 1.0000 [drp] | Freq: Two times a day (BID) | OPHTHALMIC | Status: DC | PRN

## 2022-01-29 MED ORDER — FLUTICASONE PROPIONATE 50 MCG/ACT NA SUSP
2.0000 | Freq: Every day | NASAL | Status: DC
  Administered 2022-01-31: 2 via NASAL
  Filled 2022-01-29: qty 16

## 2022-01-29 MED ORDER — CEFAZOLIN SODIUM-DEXTROSE 2-4 GM/100ML-% IV SOLN
2.0000 g | INTRAVENOUS | Status: AC
  Administered 2022-01-29: 2 g via INTRAVENOUS
  Filled 2022-01-29: qty 100

## 2022-01-29 MED ORDER — FENTANYL CITRATE (PF) 100 MCG/2ML IJ SOLN
INTRAMUSCULAR | Status: AC
  Administered 2022-01-29: 50 ug via INTRAVENOUS
  Filled 2022-01-29: qty 2

## 2022-01-29 MED ORDER — TRANEXAMIC ACID-NACL 1000-0.7 MG/100ML-% IV SOLN
INTRAVENOUS | Status: AC
  Filled 2022-01-29: qty 100

## 2022-01-29 MED ORDER — DILTIAZEM HCL ER COATED BEADS 180 MG PO CP24
360.0000 mg | ORAL_CAPSULE | Freq: Every day | ORAL | Status: DC
  Administered 2022-01-30 – 2022-01-31 (×2): 360 mg via ORAL
  Filled 2022-01-29 (×2): qty 2

## 2022-01-29 MED ORDER — PHENYLEPHRINE HCL-NACL 20-0.9 MG/250ML-% IV SOLN
INTRAVENOUS | Status: DC | PRN
  Administered 2022-01-29: 25 ug/min via INTRAVENOUS

## 2022-01-29 MED ORDER — RIVAROXABAN 20 MG PO TABS
20.0000 mg | ORAL_TABLET | Freq: Every day | ORAL | Status: DC
  Administered 2022-01-30 – 2022-01-31 (×2): 20 mg via ORAL
  Filled 2022-01-29 (×2): qty 1

## 2022-01-29 MED ORDER — BUPIVACAINE HCL (PF) 0.25 % IJ SOLN
INTRAMUSCULAR | Status: AC
  Filled 2022-01-29: qty 30

## 2022-01-29 MED ORDER — ONDANSETRON HCL 4 MG PO TABS
4.0000 mg | ORAL_TABLET | Freq: Four times a day (QID) | ORAL | Status: DC | PRN
  Administered 2022-01-30 – 2022-01-31 (×3): 4 mg via ORAL
  Filled 2022-01-29 (×4): qty 1

## 2022-01-29 MED ORDER — OXYCODONE-ACETAMINOPHEN 5-325 MG PO TABS: 1.0000 | ORAL_TABLET | ORAL | 0 refills | Status: DC | PRN

## 2022-01-29 MED ORDER — METHOCARBAMOL 1000 MG/10ML IJ SOLN: 500.0000 mg | Freq: Four times a day (QID) | INTRAVENOUS | Status: DC | PRN

## 2022-01-29 MED ORDER — VITAMIN D 25 MCG (1000 UNIT) PO TABS
ORAL_TABLET | ORAL | Status: DC
  Administered 2022-01-31: 1000 [IU] via ORAL
  Filled 2022-01-29: qty 1

## 2022-01-29 MED ORDER — 0.9 % SODIUM CHLORIDE (POUR BTL) OPTIME
TOPICAL | Status: DC | PRN
  Administered 2022-01-29: 1000 mL

## 2022-01-29 MED ORDER — OMEGA-3-ACID ETHYL ESTERS 1 G PO CAPS: 1.0000 g | ORAL_CAPSULE | Freq: Every day | ORAL | Status: DC

## 2022-01-29 MED ORDER — POLYETHYLENE GLYCOL 3350 17 G PO PACK: 17.0000 g | PACK | Freq: Every day | ORAL | Status: DC | PRN

## 2022-01-29 MED ORDER — IRBESARTAN 300 MG PO TABS
300.0000 mg | ORAL_TABLET | Freq: Every day | ORAL | Status: DC
  Administered 2022-01-30 – 2022-01-31 (×2): 300 mg via ORAL
  Filled 2022-01-29 (×2): qty 1

## 2022-01-29 MED ORDER — EPHEDRINE SULFATE-NACL 50-0.9 MG/10ML-% IV SOSY
PREFILLED_SYRINGE | INTRAVENOUS | Status: DC | PRN
  Administered 2022-01-29: 10 mg via INTRAVENOUS
  Administered 2022-01-29 (×3): 5 mg via INTRAVENOUS

## 2022-01-29 MED ORDER — EPINEPHRINE 0.3 MG/0.3ML IJ SOAJ: 0.3000 mg | Freq: Once | INTRAMUSCULAR | Status: DC | PRN

## 2022-01-29 MED ORDER — TURMERIC 500 MG PO CAPS: 1500.0000 mg | ORAL_CAPSULE | ORAL | Status: DC

## 2022-01-29 MED ORDER — METOPROLOL TARTRATE 50 MG PO TABS
50.0000 mg | ORAL_TABLET | Freq: Two times a day (BID) | ORAL | Status: DC
  Administered 2022-01-30 – 2022-01-31 (×3): 50 mg via ORAL
  Filled 2022-01-29 (×3): qty 1

## 2022-01-29 MED ORDER — OXYCODONE HCL 5 MG PO TABS
5.0000 mg | ORAL_TABLET | ORAL | Status: DC | PRN
  Administered 2022-01-30: 10 mg via ORAL
  Administered 2022-01-30 – 2022-01-31 (×4): 5 mg via ORAL
  Filled 2022-01-29 (×3): qty 1
  Filled 2022-01-29: qty 2

## 2022-01-29 MED ORDER — LIDOCAINE 2% (20 MG/ML) 5 ML SYRINGE
INTRAMUSCULAR | Status: AC
  Filled 2022-01-29: qty 5

## 2022-01-29 MED ORDER — ALBUTEROL SULFATE (2.5 MG/3ML) 0.083% IN NEBU: 3.0000 mL | INHALATION_SOLUTION | RESPIRATORY_TRACT | Status: DC | PRN

## 2022-01-29 MED ORDER — METOCLOPRAMIDE HCL 5 MG/ML IJ SOLN
5.0000 mg | Freq: Three times a day (TID) | INTRAMUSCULAR | Status: DC | PRN
  Administered 2022-01-29 – 2022-01-30 (×2): 10 mg via INTRAVENOUS
  Filled 2022-01-29 (×3): qty 2

## 2022-01-29 MED ORDER — PHENOL 1.4 % MT LIQD: 1.0000 | OROMUCOSAL | Status: DC | PRN

## 2022-01-29 MED ORDER — FUROSEMIDE 40 MG PO TABS: 40.0000 mg | ORAL_TABLET | Freq: Every day | ORAL | Status: DC | PRN

## 2022-01-29 MED ORDER — SODIUM CHLORIDE 0.9 % IV SOLN: INTRAVENOUS | Status: DC

## 2022-01-29 MED ORDER — MIDAZOLAM HCL 2 MG/2ML IJ SOLN
INTRAMUSCULAR | Status: AC
  Filled 2022-01-29: qty 2

## 2022-01-29 MED ORDER — PROPOFOL 10 MG/ML IV BOLUS
INTRAVENOUS | Status: DC | PRN
  Administered 2022-01-29: 100 mg via INTRAVENOUS
  Administered 2022-01-29: 200 mg via INTRAVENOUS

## 2022-01-29 MED ORDER — METHOCARBAMOL 500 MG PO TABS: 500.0000 mg | ORAL_TABLET | Freq: Four times a day (QID) | ORAL | Status: DC | PRN

## 2022-01-29 MED ORDER — FLEET ENEMA 7-19 GM/118ML RE ENEM: 1.0000 | ENEMA | Freq: Once | RECTAL | Status: DC | PRN

## 2022-01-29 MED ORDER — SODIUM CHLORIDE 0.9 % IR SOLN
Status: DC | PRN
  Administered 2022-01-29: 3000 mL

## 2022-01-29 MED ORDER — BUPIVACAINE LIPOSOME 1.3 % IJ SUSP
INTRAMUSCULAR | Status: AC
  Filled 2022-01-29: qty 20

## 2022-01-29 MED ORDER — OXYCODONE HCL 5 MG PO TABS: 5.0000 mg | ORAL_TABLET | Freq: Once | ORAL | Status: DC | PRN

## 2022-01-29 MED ORDER — MIDAZOLAM HCL 2 MG/2ML IJ SOLN
INTRAMUSCULAR | Status: DC | PRN
  Administered 2022-01-29 (×2): 1 mg via INTRAVENOUS

## 2022-01-29 MED ORDER — OXYCODONE HCL 5 MG/5ML PO SOLN: 5.0000 mg | Freq: Once | ORAL | Status: DC | PRN

## 2022-01-29 MED ORDER — GUAIFENESIN ER 600 MG PO TB12: 600.0000 mg | ORAL_TABLET | Freq: Two times a day (BID) | ORAL | Status: DC

## 2022-01-29 MED ORDER — LIDOCAINE 2% (20 MG/ML) 5 ML SYRINGE
INTRAMUSCULAR | Status: DC | PRN
  Administered 2022-01-29: 40 mg via INTRAVENOUS

## 2022-01-29 MED ORDER — ONDANSETRON HCL 4 MG/2ML IJ SOLN
INTRAMUSCULAR | Status: DC | PRN
  Administered 2022-01-29: 4 mg via INTRAVENOUS

## 2022-01-29 MED ORDER — PANTOPRAZOLE SODIUM 40 MG PO TBEC
40.0000 mg | DELAYED_RELEASE_TABLET | Freq: Every day | ORAL | Status: DC
  Administered 2022-01-30 – 2022-01-31 (×2): 40 mg via ORAL
  Filled 2022-01-29 (×2): qty 1

## 2022-01-29 MED ORDER — CHLORHEXIDINE GLUCONATE 0.12 % MT SOLN
15.0000 mL | Freq: Once | OROMUCOSAL | Status: AC
  Administered 2022-01-29: 15 mL via OROMUCOSAL
  Filled 2022-01-29: qty 15

## 2022-01-29 MED ORDER — ROPIVACAINE HCL 5 MG/ML IJ SOLN
INTRAMUSCULAR | Status: DC | PRN
  Administered 2022-01-29: 20 mL via PERINEURAL

## 2022-01-29 MED ORDER — FENTANYL CITRATE (PF) 100 MCG/2ML IJ SOLN
25.0000 ug | INTRAMUSCULAR | Status: DC | PRN
  Administered 2022-01-29: 25 ug via INTRAVENOUS
  Administered 2022-01-29: 50 ug via INTRAVENOUS
  Administered 2022-01-29: 25 ug via INTRAVENOUS

## 2022-01-29 MED ORDER — OXYCODONE HCL 5 MG PO TABS
10.0000 mg | ORAL_TABLET | ORAL | Status: DC | PRN
  Administered 2022-01-29 – 2022-01-30 (×2): 15 mg via ORAL
  Filled 2022-01-29: qty 2
  Filled 2022-01-29 (×3): qty 3

## 2022-01-29 MED ORDER — ROSUVASTATIN CALCIUM 5 MG PO TABS
10.0000 mg | ORAL_TABLET | Freq: Every day | ORAL | Status: DC
  Administered 2022-01-30 – 2022-01-31 (×2): 10 mg via ORAL
  Filled 2022-01-29 (×2): qty 2

## 2022-01-29 MED ORDER — ONDANSETRON HCL 4 MG/2ML IJ SOLN
4.0000 mg | Freq: Four times a day (QID) | INTRAMUSCULAR | Status: DC | PRN
  Administered 2022-01-29 – 2022-01-30 (×4): 4 mg via INTRAVENOUS
  Filled 2022-01-29 (×5): qty 2

## 2022-01-29 MED ORDER — FENTANYL CITRATE (PF) 250 MCG/5ML IJ SOLN
INTRAMUSCULAR | Status: AC
  Filled 2022-01-29: qty 5

## 2022-01-29 MED ORDER — FENTANYL CITRATE (PF) 100 MCG/2ML IJ SOLN
INTRAMUSCULAR | Status: AC
  Filled 2022-01-29: qty 2

## 2022-01-29 MED ORDER — MENTHOL 3 MG MT LOZG: 1.0000 | LOZENGE | OROMUCOSAL | Status: DC | PRN

## 2022-01-29 MED ORDER — SPIRONOLACTONE 25 MG PO TABS
25.0000 mg | ORAL_TABLET | Freq: Every day | ORAL | Status: DC
  Administered 2022-01-30 – 2022-01-31 (×2): 25 mg via ORAL
  Filled 2022-01-29 (×2): qty 1

## 2022-01-29 SURGICAL SUPPLY — 88 items
ADH SKN CLS APL DERMABOND .7 (GAUZE/BANDAGES/DRESSINGS) ×1
ATTUNE PS FEM RT SZ 5 CEM KNEE (Femur) IMPLANT
ATTUNE PSRP INSR SZ5 5 KNEE (Insert) IMPLANT
BAG COUNTER SPONGE SURGICOUNT (BAG) ×1 IMPLANT
BAG SPNG CNTER NS LX DISP (BAG) ×1
BANDAGE ESMARK 6X9 LF (GAUZE/BANDAGES/DRESSINGS) ×1 IMPLANT
BASE TIBIAL ROT PLAT SZ 5 KNEE (Knees) IMPLANT
BLADE SAGITTAL 25.0X1.19X90 (BLADE) ×1 IMPLANT
BLADE SAW SGTL 13X75X1.27 (BLADE) ×1 IMPLANT
BLADE SURG 10 STRL SS (BLADE) IMPLANT
BLADE SURG 15 STRL LF DISP TIS (BLADE) IMPLANT
BLADE SURG 15 STRL SS (BLADE) ×1
BNDG CMPR 5X4 CHSV STRCH STRL (GAUZE/BANDAGES/DRESSINGS) ×1
BNDG CMPR 9X6 STRL LF SNTH (GAUZE/BANDAGES/DRESSINGS) ×1
BNDG CMPR MED 10X6 ELC LF (GAUZE/BANDAGES/DRESSINGS) ×1
BNDG CMPR MED 15X6 ELC VLCR LF (GAUZE/BANDAGES/DRESSINGS) ×1
BNDG CMPR STD VLCR NS LF 5.8X4 (GAUZE/BANDAGES/DRESSINGS) ×1
BNDG COHESIVE 4X5 TAN STRL LF (GAUZE/BANDAGES/DRESSINGS) IMPLANT
BNDG ELASTIC 4X5.8 VLCR NS LF (GAUZE/BANDAGES/DRESSINGS) ×1 IMPLANT
BNDG ELASTIC 4X5.8 VLCR STR LF (GAUZE/BANDAGES/DRESSINGS) ×1 IMPLANT
BNDG ELASTIC 6X10 VLCR STRL LF (GAUZE/BANDAGES/DRESSINGS) ×1 IMPLANT
BNDG ELASTIC 6X15 VLCR STRL LF (GAUZE/BANDAGES/DRESSINGS) IMPLANT
BNDG ELASTIC 6X5.8 VLCR STR LF (GAUZE/BANDAGES/DRESSINGS) IMPLANT
BNDG ESMARK 6X9 LF (GAUZE/BANDAGES/DRESSINGS) ×1
BOWL SMART MIX CTS (DISPOSABLE) ×1 IMPLANT
BSPLAT TIB 5 CMNT ROT PLAT STR (Knees) ×1 IMPLANT
CEMENT HV SMART SET (Cement) ×2 IMPLANT
COVER SURGICAL LIGHT HANDLE (MISCELLANEOUS) ×1 IMPLANT
CUFF TOURN SGL QUICK 34 (TOURNIQUET CUFF) ×1
CUFF TOURN SGL QUICK 42 (TOURNIQUET CUFF) IMPLANT
CUFF TRNQT CYL 34X4.125X (TOURNIQUET CUFF) ×1 IMPLANT
DERMABOND ADVANCED .7 DNX12 (GAUZE/BANDAGES/DRESSINGS) IMPLANT
DRAPE ORTHO SPLIT 77X108 STRL (DRAPES) ×2
DRAPE SURG ORHT 6 SPLT 77X108 (DRAPES) ×2 IMPLANT
DRAPE U-SHAPE 47X51 STRL (DRAPES) ×1 IMPLANT
DRSG AQUACEL AG ADV 3.5X10 (GAUZE/BANDAGES/DRESSINGS) IMPLANT
DRSG MEPILEX POST OP 4X12 (GAUZE/BANDAGES/DRESSINGS) IMPLANT
DURAPREP 26ML APPLICATOR (WOUND CARE) ×2 IMPLANT
ELECT REM PT RETURN 9FT ADLT (ELECTROSURGICAL) ×1
ELECTRODE REM PT RTRN 9FT ADLT (ELECTROSURGICAL) ×1 IMPLANT
FACESHIELD WRAPAROUND (MASK) ×2 IMPLANT
FACESHIELD WRAPAROUND OR TEAM (MASK) ×2 IMPLANT
GAUZE PAD ABD 8X10 STRL (GAUZE/BANDAGES/DRESSINGS) ×1 IMPLANT
GAUZE SPONGE 4X4 12PLY STRL (GAUZE/BANDAGES/DRESSINGS) IMPLANT
GAUZE XEROFORM 5X9 LF (GAUZE/BANDAGES/DRESSINGS) ×1 IMPLANT
GLOVE BIOGEL PI IND STRL 8 (GLOVE) ×2 IMPLANT
GLOVE ORTHO TXT STRL SZ7.5 (GLOVE) ×2 IMPLANT
GLOVE PI ORTHO PRO STRL 7.5 (GLOVE) IMPLANT
GOWN STRL REUS W/ TWL LRG LVL3 (GOWN DISPOSABLE) ×1 IMPLANT
GOWN STRL REUS W/ TWL XL LVL3 (GOWN DISPOSABLE) ×1 IMPLANT
GOWN STRL REUS W/TWL 2XL LVL3 (GOWN DISPOSABLE) ×1 IMPLANT
GOWN STRL REUS W/TWL LRG LVL3 (GOWN DISPOSABLE) ×1
GOWN STRL REUS W/TWL XL LVL3 (GOWN DISPOSABLE) ×1
HANDPIECE INTERPULSE COAX TIP (DISPOSABLE) ×1
IMMOBILIZER KNEE 22 UNIV (SOFTGOODS) ×1 IMPLANT
KIT BASIN OR (CUSTOM PROCEDURE TRAY) ×1 IMPLANT
KIT TURNOVER KIT B (KITS) ×1 IMPLANT
MANIFOLD NEPTUNE II (INSTRUMENTS) ×1 IMPLANT
MARKER SKIN DUAL TIP RULER LAB (MISCELLANEOUS) ×1 IMPLANT
NDL 18GX1X1/2 (RX/OR ONLY) (NEEDLE) ×1 IMPLANT
NDL HYPO 25GX1X1/2 BEV (NEEDLE) ×1 IMPLANT
NEEDLE 18GX1X1/2 (RX/OR ONLY) (NEEDLE) ×1 IMPLANT
NEEDLE HYPO 25GX1X1/2 BEV (NEEDLE) ×1 IMPLANT
NS IRRIG 1000ML POUR BTL (IV SOLUTION) ×1 IMPLANT
PACK TOTAL JOINT (CUSTOM PROCEDURE TRAY) ×1 IMPLANT
PAD ARMBOARD 7.5X6 YLW CONV (MISCELLANEOUS) ×2 IMPLANT
PAD CAST 4YDX4 CTTN HI CHSV (CAST SUPPLIES) IMPLANT
PADDING CAST COTTON 4X4 STRL (CAST SUPPLIES) ×1
PADDING CAST COTTON 6X4 STRL (CAST SUPPLIES) ×1 IMPLANT
PATELLA MEDIAL ATTUN 35MM KNEE (Knees) IMPLANT
PIN STEINMAN FIXATION KNEE (PIN) IMPLANT
SET HNDPC FAN SPRY TIP SCT (DISPOSABLE) ×1 IMPLANT
STAPLER VISISTAT 35W (STAPLE) IMPLANT
SUCTION FRAZIER HANDLE 10FR (MISCELLANEOUS) ×1
SUCTION TUBE FRAZIER 10FR DISP (MISCELLANEOUS) ×1 IMPLANT
SUT MNCRL AB 3-0 PS2 27 (SUTURE) IMPLANT
SUT VIC AB 0 CT1 27 (SUTURE) ×1
SUT VIC AB 0 CT1 27XBRD ANBCTR (SUTURE) ×1 IMPLANT
SUT VIC AB 1 CTX 36 (SUTURE) ×2
SUT VIC AB 1 CTX36XBRD ANBCTR (SUTURE) ×2 IMPLANT
SUT VIC AB 2-0 CT1 27 (SUTURE) ×2
SUT VIC AB 2-0 CT1 TAPERPNT 27 (SUTURE) ×2 IMPLANT
SYR 50ML LL SCALE MARK (SYRINGE) ×1 IMPLANT
SYR CONTROL 10ML LL (SYRINGE) ×1 IMPLANT
TIBIAL BASE ROT PLAT SZ 5 KNEE (Knees) ×1 IMPLANT
TOWEL GREEN STERILE (TOWEL DISPOSABLE) ×1 IMPLANT
TOWEL GREEN STERILE FF (TOWEL DISPOSABLE) ×1 IMPLANT
TRAY CATH 16FR W/PLASTIC CATH (SET/KITS/TRAYS/PACK) IMPLANT
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
## 2022-01-29 NOTE — Interval H&P Note (Signed)
History and Physical Interval Note:  01/29/2022 12:26 PM  Becky [name]  has presented today for surgery, with the diagnosis of right knee osteoarthritis.  The various methods of treatment have been discussed with the patient and family. After consideration of risks, benefits and other options for treatment, the patient has consented to  Procedure(s) with comments: RIGHT TOTAL KNEE ARTHROPLASTY (Right) - RNFA; regional block also as a surgical intervention.  The patient's history has been reviewed, patient examined, no change in status, stable for surgery.  I have reviewed the patient's chart and labs.  Questions were answered to the patient's satisfaction.     [name]

## 2022-01-29 NOTE — Anesthesia Procedure Notes (Signed)
Anesthesia Regional Block: Adductor canal block   Pre-Anesthetic Checklist: , timeout performed,  Correct Patient, Correct Site, Correct Laterality,  Correct Procedure, Correct Position, site marked,  Risks and benefits discussed,  Surgical consent,  Pre-op evaluation,  At surgeon's request and post-op pain management  Laterality: Right  Prep: chloraprep       Needles:  Injection technique: Single-shot  Needle Type: Echogenic Needle     Needle Length: 9cm  Needle Gauge: 21     Additional Needles:   Procedures:,,,, ultrasound used (permanent image in chart),,    Narrative:  Start time: 01/29/2022 12:00 PM End time: 01/29/2022 12:06 PM Injection made incrementally with aspirations every 5 mL.  Performed by: Personally  Anesthesiologist: Suzette Battiest, MD

## 2022-01-29 NOTE — Op Note (Signed)
Preop diagnosis: Posttraumatic right knee osteoarthritis  Postop diagnosis: Same  Procedure: Right total knee arthroplasty, cemented.  Surgeon: Rodell Perna, MD  Assistant: April Fulp, RNFA  Tourniquet time 300 x 57 minutes.  Anesthesia General orotracheal plus preoperative knee block plus Exparel and Marcaine 20+20.  Implants:Implants  CEMENT HV SMART SET - [id number]  Inventory Item: CEMENT HV SMART SET Serial no.: Model/Cat no.: [id number]  Implant name: CEMENT HV SMART SET - [id number] Laterality: Right Area: Knee  Manufacturer: [employer] Date of Manufacture:   Action: Implanted Number Used: 2   Device Identifier: Device Identifier Type:   TIBIAL BASE ROT PLAT SZ 5 KNEE - [id number]  Inventory Item: TIBIAL BASE ROT PLAT SZ 5 KNEE Serial no.: Model/Cat no.: [id number]  Implant name: TIBIAL BASE ROT PLAT SZ 5 KNEE - [id number] Laterality: Right Area: Knee  Manufacturer: [employer] Date of Manufacture:   Action: Implanted Number Used: 1   Device Identifier: Device Identifier Type:   ATTUNE PS FEM RT SZ 5 CEM KNEE - [id number]  Inventory Item: ATTUNE PS FEM RT SZ 5 CEM KNEE Serial no.: Model/Cat no.: [id number]  Implant name: ATTUNE PS FEM RT SZ 5 CEM KNEE - [id number] Laterality: Right Area: Knee  Manufacturer: [employer] Date of Manufacture:   Action: Implanted Number Used: 1   Device Identifier: Device Identifier Type:   PATELLA MEDIAL ATTUN 35MM KNEE - [id number]  Inventory Item: PATELLA MEDIAL ATTUN 35MM KNEE Serial no.: Model/Cat no.: [id number]  Implant name: PATELLA MEDIAL ATTUN 35MM KNEE - [id number] Laterality: Right Area: Knee  Manufacturer: [employer] Date of Manufacture:   Action: Implanted Number Used: 1   Device Identifier: Device Identifier Type:   ATTUNE PSRP INSR SZ5 5 KNEE - [id number]  Inventory Item: ATTUNE PSRP INSR SZ5 5 KNEE Serial no.: Model/Cat no.: [id number]  Implant name: ATTUNE PSRP INSR SZ5 5 KNEE - [id number]  Laterality: Right Area: Knee  Manufacturer: [place] Date of Manufacture:   Action: Implanted Number Used: 1   Device Identifier: Device Identifier Type:   Procedure: The patient had been on Xarelto chronically and spinal anesthesia was not done since patient stopped it 2 days before instead of 3.  General anesthesia was used after induction of general esthesia proximal thigh tourniquet lateral bump heel post were applied standard prepping and draping DuraPrep from the tip the toes the tourniquet extremity sheets draped sterile skin marker Betadine Steri-Drape and Betadine Steri-Drape sealing the skin was applied.  Timeout procedure was completed.  2 g Ancef 1 g TXA by anesthesia team.  Leg was wrapped in Esmarch tourniquet inflated after timeout at 300 mm pressure. Incision was made patella was everted patella with small cut 10 mm resection.  Intramedullary hole placed in the femur.  10 mm resected off the femur 10 off the tibia difficulty removing the lateral bone since patient had tibial plateau fracture and had plate removal couple months ago and at the time my bone graft had been added at the time of her fixation.  Once the bone was finally cleaned out 5 mm spacer block fit between the knee and in good position.  #5 femur based on sizing chamfer cuts box cuts he will preparation trials inserted full extension collateral ligaments were balanced.  Pulse lavage vacuum mixing of the cement and cement and cementing of the tibial component first all excessive cement was removed.  The femur was cemented followed by placement of the permanent rotating platform 5 mm and then holding the patella.  Lug nuts had been drilled and the lug holes in the femur.  All excessive cement was cleared gutters were clean.  After cement was hardened 15 minutes Exparel and Marcaine was infiltrated 20+20.  Tourniquet deflated hemostasis obtained standard layered closure #1 Vicryl in the fascia 2 on subtenons tissue septic  of closure postop dressing Aquacel ABDs 4 x 4's web roll and Ace wrap.  Knee immobilizer was applied patient tolerated procedure well transferred recovery in stable condition.

## 2022-01-29 NOTE — Anesthesia Procedure Notes (Addendum)
Procedure Name: LMA Insertion Date/Time: 01/29/2022 1:06 PM  Performed by: Michele Rockers, CRNAPre-anesthesia Checklist: Patient identified, Emergency Drugs available, Suction available and Patient being monitored Patient Re-evaluated:Patient Re-evaluated prior to induction Oxygen Delivery Method: Circle system utilized Preoxygenation: Pre-oxygenation with 100% oxygen Induction Type: IV induction Ventilation: Mask ventilation without difficulty LMA: LMA flexible inserted Number of attempts: 1 Placement Confirmation: positive ETCO2 and breath sounds checked- equal and bilateral Tube secured with: Tape Dental Injury: Teeth and Oropharynx as per pre-operative assessment

## 2022-01-29 NOTE — Discharge Instructions (Signed)

## 2022-01-29 NOTE — Transfer of Care (Signed)
Immediate Anesthesia Transfer of Care Note  [name]  Procedure(s) Performed: RIGHT TOTAL KNEE ARTHROPLASTY (Right: Knee)  Patient Location: PACU  Anesthesia Type:General and Regional  Level of Consciousness: awake, alert , and oriented  Airway & Oxygen Therapy: Patient Spontanous Breathing and Patient connected to nasal cannula oxygen  Post-op Assessment: Report given to RN and Post -op Vital signs reviewed and stable  Post vital signs: Reviewed and stable  Last Vitals:  Vitals Value Taken Time  BP 120/67 01/29/22 1502  Temp    Pulse 56 01/29/22 1504  Resp 18 01/29/22 1504  SpO2 94 % 01/29/22 1504  Vitals shown include unvalidated device data.  Last Pain:  Vitals:   01/29/22 1106  PainSc: 0-No pain         Complications: No notable events documented.

## 2022-01-29 NOTE — Evaluation (Signed)
Physical Therapy Evaluation Patient Details Name: Danahi Reddish MRN: [id number] DOB: 1960/07/14 Today's Date: 01/29/2022  History of Present Illness  62 y.o. female presents to [facility] hospital on 01/09/2022 for elective R TKA. PMH includes asthma, OA, GERD, HTN, HLD, migraines, OSA.  Clinical Impression  Pt presents to PT with deficits in functional mobility, gait, balance, endurance, ROM. PT eval limited as pt is very groggy and nauseous, recently receiving IV dilaudid which may have contributed to symptoms. Pt is able to perform bed mobility and transfer with support of walker well. PT limits ambulation distance due to grogginess. PT anticipates the pt will progress very well. PT will follow up in the morning to increase ambulation distances and perform stair training.       Recommendations for follow up therapy are one component of a multi-disciplinary discharge planning process, led by the attending physician.  Recommendations may be updated based on patient status, additional functional criteria and insurance authorization.  Follow Up Recommendations Follow physician's recommendations for discharge plan and follow up therapies      Assistance Recommended at Discharge PRN  Patient can return home with the following  A little help with bathing/dressing/bathroom;Assistance with cooking/housework;Assist for transportation;Help with stairs or ramp for entrance    Equipment Recommendations None recommended by PT  Recommendations for Other Services       Functional Status Assessment Patient has had a recent decline in their functional status and demonstrates the ability to make significant improvements in function in a reasonable and predictable amount of time.     Precautions / Restrictions Precautions Precautions: Fall;Knee Precaution Booklet Issued: Yes (comment) Required Braces or Orthoses: Knee Immobilizer - Right Knee Immobilizer - Right:  (no orders noted) Restrictions Weight  Bearing Restrictions: Yes RLE Weight Bearing: Weight bearing as tolerated      Mobility  Bed Mobility Overal bed mobility: Needs Assistance Bed Mobility: Supine to Sit, Sit to Supine     Supine to sit: Min guard Sit to supine: Min guard        Transfers Overall transfer level: Needs assistance Equipment used: Rolling walker (2 wheels) Transfers: Sit to/from Stand Sit to Stand: Min guard                Ambulation/Gait Ambulation/Gait assistance: Min guard Gait Distance (Feet): 3 Feet Assistive device: Rolling walker (2 wheels) Gait Pattern/deviations: Step-to pattern Gait velocity: reduced Gait velocity interpretation: <1.31 ft/sec, indicative of household ambulator   General Gait Details: slowed step-to gait forward and backward from bed. PT limits distance due to pt remaining groggy and nauseous  Stairs            Wheelchair Mobility    Modified Rankin (Stroke Patients Only)       Balance Overall balance assessment: Needs assistance Sitting-balance support: No upper extremity supported, Feet supported Sitting balance-Leahy Scale: Good     Standing balance support: Bilateral upper extremity supported, Reliant on assistive device for balance Standing balance-Leahy Scale: Poor                               Pertinent Vitals/Pain Pain Assessment Pain Assessment: No/denies pain (received dilaudid prior to session)    Home Living Family/patient expects to be discharged to:: Private residence Living Arrangements: Spouse/significant other;Children Available Help at Discharge: Family;Available 24 hours/day Type of Home: Mobile home Home Access: Stairs to enter Entrance Stairs-Rails: Can reach both Entrance Stairs-Number of Steps: 4-5   Home Layout:  One level Home Equipment: Conservation officer, nature (2 wheels);Shower seat;BSC/3in1;Cane - single point      Prior Function Prior Level of Function : Independent/Modified Independent              Mobility Comments: utilizing SPC vs RW for ambulation       Hand Dominance   Dominant Hand: Right    Extremity/Trunk Assessment   Upper Extremity Assessment Upper Extremity Assessment: Overall WFL for tasks assessed    Lower Extremity Assessment Lower Extremity Assessment: RLE deficits/detail RLE Deficits / Details: left in KI during session, SLR 3-/5, ankle PF/DF 4-/5    Cervical / Trunk Assessment Cervical / Trunk Assessment: Other exceptions Cervical / Trunk Exceptions: obesity  Communication   Communication: No difficulties  Cognition Arousal/Alertness: Suspect due to medications (pt awakens to stimuli, otherwise quickly falls asleep) Behavior During Therapy: WFL for tasks assessed/performed Overall Cognitive Status: Within Functional Limits for tasks assessed                                          General Comments General comments (skin integrity, edema, etc.): VSS on RA    Exercises     Assessment/Plan    PT Assessment Patient needs continued PT services  PT Problem List Decreased strength;Decreased range of motion;Decreased activity tolerance;Decreased balance;Decreased mobility;Pain       PT Treatment Interventions DME instruction;Gait training;Stair training;Functional mobility training;Therapeutic activities;Balance training;Therapeutic exercise;Neuromuscular re-education;Patient/family education    PT Goals (Current goals can be found in the Care Plan section)  Acute Rehab PT Goals Patient Stated Goal: to return to independence PT Goal Formulation: With patient Time For Goal Achievement: 02/03/22 Potential to Achieve Goals: Good    Frequency 7X/week     Co-evaluation               AM-PAC PT "6 Clicks" Mobility  Outcome Measure Help needed turning from your back to your side while in a flat bed without using bedrails?: A Little Help needed moving from lying on your back to sitting on the side of a flat bed without  using bedrails?: A Little Help needed moving to and from a bed to a chair (including a wheelchair)?: A Little Help needed standing up from a chair using your arms (e.g., wheelchair or bedside chair)?: A Little Help needed to walk in hospital room?: Total Help needed climbing 3-5 steps with a railing? : Total 6 Click Score: 14    End of Session Equipment Utilized During Treatment: Right knee immobilizer Activity Tolerance: Patient limited by lethargy (pain meds contributing) Patient left: in bed;with call bell/phone within reach;with bed alarm set;with nursing/sitter in room;with family/visitor present Nurse Communication: Mobility status PT Visit Diagnosis: Other abnormalities of gait and mobility (R26.89);Muscle weakness (generalized) (M62.81);Pain Pain - Right/Left: Right Pain - part of body: Knee    Time: 3846-6599 PT Time Calculation (min) (ACUTE ONLY): 18 min   Charges:   PT Evaluation $PT Eval Low Complexity: 1 Low          Zenaida Niece, PT, DPT Acute Rehabilitation Office [phone]   Zenaida Niece 01/29/2022, 5:40 PM

## 2022-01-29 NOTE — Anesthesia Postprocedure Evaluation (Signed)
Anesthesia Post Note  [name]  Procedure(s) Performed: RIGHT TOTAL KNEE ARTHROPLASTY (Right: Knee)     Patient location during evaluation: PACU Anesthesia Type: Regional and General Level of consciousness: awake and alert Pain management: pain level controlled Vital Signs Assessment: post-procedure vital signs reviewed and stable Respiratory status: spontaneous breathing, nonlabored ventilation, respiratory function stable and patient connected to nasal cannula oxygen Cardiovascular status: blood pressure returned to baseline and stable Postop Assessment: no apparent nausea or vomiting Anesthetic complications: no  No notable events documented.  Last Vitals:  Vitals:   01/29/22 1550 01/29/22 1555  BP: 123/61   Pulse: (!) 56 (!) 56  Resp: 12   Temp:    SpO2: 97%     Last Pain:  Vitals:   01/29/22 1550  PainSc: 4                  Lindon Kiel,W. EDMOND

## 2022-01-30 ENCOUNTER — Encounter (HOSPITAL_COMMUNITY): Payer: Self-pay | Admitting: Orthopaedic Surgery

## 2022-01-30 DIAGNOSIS — E119 Type 2 diabetes mellitus without complications: Secondary | ICD-10-CM | POA: Diagnosis not present

## 2022-01-30 DIAGNOSIS — J45909 Unspecified asthma, uncomplicated: Secondary | ICD-10-CM | POA: Diagnosis not present

## 2022-01-30 DIAGNOSIS — Z91040 Latex allergy status: Secondary | ICD-10-CM | POA: Diagnosis not present

## 2022-01-30 DIAGNOSIS — M1731 Unilateral post-traumatic osteoarthritis, right knee: Secondary | ICD-10-CM | POA: Diagnosis not present

## 2022-01-30 DIAGNOSIS — I2510 Atherosclerotic heart disease of native coronary artery without angina pectoris: Secondary | ICD-10-CM | POA: Diagnosis not present

## 2022-01-30 DIAGNOSIS — Z87891 Personal history of nicotine dependence: Secondary | ICD-10-CM | POA: Diagnosis not present

## 2022-01-30 DIAGNOSIS — I480 Paroxysmal atrial fibrillation: Secondary | ICD-10-CM | POA: Diagnosis not present

## 2022-01-30 DIAGNOSIS — E039 Hypothyroidism, unspecified: Secondary | ICD-10-CM | POA: Diagnosis not present

## 2022-01-30 LAB — CBC
HCT: 28.8 % — ABNORMAL LOW (ref 36.0–46.0)
Hemoglobin: 9.8 g/dL — ABNORMAL LOW (ref 12.0–15.0)
MCH: 31.2 pg (ref 26.0–34.0)
MCHC: 34 g/dL (ref 30.0–36.0)
MCV: 91.7 fL (ref 80.0–100.0)
Platelets: 256 10*3/uL (ref 150–400)
RBC: 3.14 MIL/uL — ABNORMAL LOW (ref 3.87–5.11)
RDW: 13.4 % (ref 11.5–15.5)
WBC: 10.2 10*3/uL (ref 4.0–10.5)
nRBC: 0 % (ref 0.0–0.2)

## 2022-01-30 LAB — BASIC METABOLIC PANEL
Anion gap: 5 (ref 5–15)
BUN: 12 mg/dL (ref 6–20)
CO2: 25 mmol/L (ref 22–32)
Calcium: 8.4 mg/dL — ABNORMAL LOW (ref 8.9–10.3)
Chloride: 104 mmol/L (ref 98–111)
Creatinine, Ser: 0.93 mg/dL (ref 0.44–1.00)
GFR, Estimated: >60 mL/min (ref >60)
Glucose, Bld: 118 mg/dL — ABNORMAL HIGH (ref 70–99)
Potassium: 3.9 mmol/L (ref 3.5–5.1)
Sodium: 134 mmol/L — ABNORMAL LOW (ref 135–145)

## 2022-01-30 NOTE — Plan of Care (Signed)
  Problem: Education: ?Goal: Knowledge of the prescribed therapeutic regimen will improve ?Outcome: Progressing ?Goal: Individualized Educational Video(s) ?Outcome: Progressing ?  ?Problem: Activity: ?Goal: Ability to avoid complications of mobility impairment will improve ?Outcome: Progressing ?Goal: Range of joint motion will improve ?Outcome: Progressing ?  ?Problem: Clinical Measurements: ?Goal: Postoperative complications will be avoided or minimized ?Outcome: Progressing ?  ?Problem: Pain Management: ?Goal: Pain level will decrease with appropriate interventions ?Outcome: Progressing ?  ?Problem: Skin Integrity: ?Goal: Will show signs of wound healing ?Outcome: Progressing ?  ?Problem: Education: ?Goal: Knowledge of General Education information will improve ?Description: Including pain rating scale, medication(s)/side effects and non-pharmacologic comfort measures ?Outcome: Progressing ?  ?Problem: Health Behavior/Discharge Planning: ?Goal: Ability to manage health-related needs will improve ?Outcome: Progressing ?  ?Problem: Clinical Measurements: ?Goal: Ability to maintain clinical measurements within normal limits will improve ?Outcome: Progressing ?Goal: Will remain free from infection ?Outcome: Progressing ?Goal: Diagnostic test results will improve ?Outcome: Progressing ?Goal: Respiratory complications will improve ?Outcome: Progressing ?Goal: Cardiovascular complication will be avoided ?Outcome: Progressing ?  ?Problem: Activity: ?Goal: Risk for activity intolerance will decrease ?Outcome: Progressing ?  ?Problem: Nutrition: ?Goal: Adequate nutrition will be maintained ?Outcome: Progressing ?  ?Problem: Coping: ?Goal: Level of anxiety will decrease ?Outcome: Progressing ?  ?Problem: Elimination: ?Goal: Will not experience complications related to bowel motility ?Outcome: Progressing ?Goal: Will not experience complications related to urinary retention ?Outcome: Progressing ?  ?Problem: Pain  Managment: ?Goal: General experience of comfort will improve ?Outcome: Progressing ?  ?Problem: Safety: ?Goal: Ability to remain free from injury will improve ?Outcome: Progressing ?  ?Problem: Skin Integrity: ?Goal: Risk for impaired skin integrity will decrease ?Outcome: Progressing ?  ?

## 2022-01-30 NOTE — Progress Notes (Signed)
Subjective: 1 Day Post-Op Procedure(s) (LRB): RIGHT TOTAL KNEE ARTHROPLASTY (Right) Patient reports pain as 5 on 0-10 scale.    Objective: Vital signs in last 24 hours: Temp:  [97.6 F (36.4 C)-98.7 F (37.1 C)] 98.6 F (37 C) (01/25 0813) Pulse Rate:  [49-72] 72 (01/25 0813) Resp:  [11-18] 18 (01/25 0813) BP: (120-141)/(53-76) 120/74 (01/25 0813) SpO2:  [95 %-100 %] 100 % (01/25 0813) Weight:  [119.3 kg] 119.3 kg (01/24 1058)  Intake/Output from previous day: 01/24 0701 - 01/25 0700 In: 1100 [I.V.:1000; IV Piggyback:100] Out: 150 [Blood:150] Intake/Output this shift: No intake/output data recorded.  Recent Labs    01/27/22 0942 01/30/22 0239  HGB 12.6 9.8*   Recent Labs    01/27/22 0942 01/30/22 0239  WBC 12.1* 10.2  RBC 4.05 3.14*  HCT 37.7 28.8*  PLT 338 256   Recent Labs    01/27/22 0942 01/30/22 0239  NA 140 134*  K 4.4 3.9  CL 108 104  CO2 26 25  BUN 15 12  CREATININE 1.12* 0.93  GLUCOSE 127* 118*  CALCIUM 9.3 8.4*   No results for input(s): "LABPT", "INR" in the last 72 hours.  Neurovascular intact Sensation intact distally Intact pulses distally Dorsiflexion/Plantar flexion intact Incision: dressing C/D/I No cellulitis present Compartment soft   Assessment/Plan: 1 Day Post-Op Procedure(s) (LRB): RIGHT TOTAL KNEE ARTHROPLASTY (Right) Up with therapy    Patient's anticipated LOS is less than 2 midnights, meeting these requirements: - Younger than 55 - Lives within 1 hour of care - Has a competent adult at home to recover with post-op recover - NO history of  - Chronic pain requiring opiods  - Diabetes  - Coronary Artery Disease  - Heart failure  - Heart attack  - Stroke  - DVT/VTE  - Cardiac arrhythmia  - Respiratory Failure/COPD  - Renal failure  - Anemia  - Advanced Liver disease     Bevely Palmer [place] 01/30/2022, 8:30 AM

## 2022-01-30 NOTE — Progress Notes (Signed)
Physical Therapy Treatment Patient Details Name: Becky [name] MRN: [id number] DOB: May 28, 1960 Today's Date: 01/30/2022   History of Present Illness 62 y.o. female presents to [facility] on 01/09/2022 for elective R TKA. PMH includes asthma, OA, GERD, HTN, HLD, migraines, OSA.    PT Comments    Pt was received supine in bed and agreeable to therapy. Pt progress towards mobility goals continues to be limited by nausea. Vitals were obtained and pt was not orthostatic. Pt required less assistance this session for bed mobility and transfers, requiring up to min guard. Pt able to increase gait trial distance and demonstrated good effort this session, however pt deferred hallway ambulation secondary to nausea. Pt reported decrease in R knee pain during gait from 5/10 to 3/10. OT entering at end of session, so pt was left in the recliner with OT present. Pt would benefit from continued skilled therapy to progress towards functional mobility goals.   Recommendations for follow up therapy are one component of a multi-disciplinary discharge planning process, led by the attending physician.  Recommendations may be updated based on patient status, additional functional criteria and insurance authorization.  Follow Up Recommendations  Follow physician's recommendations for discharge plan and follow up therapies     Assistance Recommended at Discharge PRN  Patient can return home with the following A little help with bathing/dressing/bathroom;Assistance with cooking/housework;Assist for transportation;Help with stairs or ramp for entrance   Equipment Recommendations  None recommended by PT    Recommendations for Other Services       Precautions / Restrictions Precautions Precautions: Fall;Knee Precaution Booklet Issued: Yes (comment) Precaution Comments: provided by PT Required Braces or Orthoses: Knee Immobilizer - Right Knee Immobilizer - Right: On when out of bed or walking;Other (comment)  (no orders noted) Restrictions Weight Bearing Restrictions: Yes RLE Weight Bearing: Weight bearing as tolerated     Mobility  Bed Mobility Overal bed mobility: Needs Assistance Bed Mobility: Supine to Sit     Supine to sit: Supervision     General bed mobility comments: Pt required increased time secondary to pain    Transfers Overall transfer level: Needs assistance Equipment used: Rolling walker (2 wheels) Transfers: Sit to/from Stand Sit to Stand: Min guard, +2 safety/equipment, From elevated surface           General transfer comment: Pt able to remember hand placement and RLE placement with min cues    Ambulation/Gait Ambulation/Gait assistance: Min guard, +2 safety/equipment (chair follow due to nausea) Gait Distance (Feet): 12 Feet Assistive device: Rolling walker (2 wheels) Gait Pattern/deviations: Step-to pattern, Trunk flexed, Decreased stance time - right       General Gait Details: Pt gait trial distance limited secondary to nausea. Pt stated that she felt like she could do more if she could "get rid of this nausea" and that her R knee pain was not limiting her.    Balance Overall balance assessment: Needs assistance Sitting-balance support: No upper extremity supported, Feet supported Sitting balance-Leahy Scale: Good     Standing balance support: Bilateral upper extremity supported, Reliant on assistive device for balance Standing balance-Leahy Scale: Poor Standing balance comment: Heavily reliant on RW         Cognition Arousal/Alertness: Awake/alert Behavior During Therapy: WFL for tasks assessed/performed Overall Cognitive Status: Within Functional Limits for tasks assessed         General Comments: Pt continued to be nauseous with mobility tasks           General Comments General  comments (skin integrity, edema, etc.): Pt nauseated; Sitting BP 124/70 (87) HR 61bpm; Standing BP 124/81 (90) HR 64; Pt's bed hydraulics noted to be  malfunctioning, so bed was switched out at end of session.      Pertinent Vitals/Pain Pain Assessment Pain Assessment: 0-10 Pain Score: 5  Pain Location: R knee (Pt reported decreased pain during gait trial) Pain Descriptors / Indicators: Guarding, Grimacing Pain Intervention(s): Limited activity within patient's tolerance, Monitored during session    Home Living Family/patient expects to be discharged to:: Private residence Living Arrangements: Spouse/significant other Available Help at Discharge: Family;Available 24 hours/day Type of Home: Mobile home Home Access: Stairs to enter Entrance Stairs-Rails: Can reach both Entrance Stairs-Number of Steps: 4-5   Home Layout: One level Home Equipment: Conservation officer, nature (2 wheels);Shower seat;BSC/3in1;Cane - single point          PT Goals (current goals can now be found in the care plan section) Acute Rehab PT Goals PT Goal Formulation: With patient Time For Goal Achievement: 02/03/22 Potential to Achieve Goals: Good Progress towards PT goals: Progressing toward goals    Frequency    7X/week      PT Plan Current plan remains appropriate       AM-PAC PT "6 Clicks" Mobility   Outcome Measure  Help needed turning from your back to your side while in a flat bed without using bedrails?: A Little Help needed moving from lying on your back to sitting on the side of a flat bed without using bedrails?: A Little Help needed moving to and from a bed to a chair (including a wheelchair)?: A Little Help needed standing up from a chair using your arms (e.g., wheelchair or bedside chair)?: A Little Help needed to walk in hospital room?: A Little Help needed climbing 3-5 steps with a railing? : Total 6 Click Score: 16    End of Session Equipment Utilized During Treatment: Right knee immobilizer Activity Tolerance: Other (comment) (Pt limited by nausea.) Patient left: in chair;Other (comment);with call bell/phone within reach (OT  present to begin session) Nurse Communication: Mobility status PT Visit Diagnosis: Other abnormalities of gait and mobility (R26.89);Muscle weakness (generalized) (M62.81);Pain Pain - Right/Left: Right Pain - part of body: Knee     Time: 4680-3212 PT Time Calculation (min) (ACUTE ONLY): 15 min  Charges:  $Therapeutic Activity: 8-22 mins                     Michelle Nasuti, PTA Acute Rehabilitation Services Secure Chat Preferred  [phone]    Michelle Nasuti 01/30/2022, 5:17 PM

## 2022-01-30 NOTE — Evaluation (Signed)
Occupational Therapy Evaluation Patient Details Name: Becky [name] MRN: [id number] DOB: 06/03/1960 Today's Date: 01/30/2022   History of Present Illness 62 y.o. female presents to [facility] hospital on 01/09/2022 for elective R TKA. PMH includes asthma, OA, GERD, HTN, HLD, migraines, OSA.   Clinical Impression   Pt admitted with Right TKA. Pt currently with functional limitations due to the deficits listed below (see OT Problem List). Prior to admit, pt reports that she was independent with all BADL tasks and functional mobility. Currently, pt is presenting with decreased strength, endurance, and activity tolerance due to recent surgery and nausea requiring increased physical assistance to complete BADL tasks and functional tranfers.  Pt will benefit from skilled OT to increase their safety and independence with ADL and functional mobility for ADL to facilitate discharge to venue listed below. No follow up OT services are warranted at this time. Pt will have assistance from family when returning home.        Recommendations for follow up therapy are one component of a multi-disciplinary discharge planning process, led by the attending physician.  Recommendations may be updated based on patient status, additional functional criteria and insurance authorization.   Follow Up Recommendations  No OT follow up     Assistance Recommended at Discharge PRN  Patient can return home with the following A little help with walking and/or transfers;A little help with bathing/dressing/bathroom;Help with stairs or ramp for entrance;Assist for transportation;Assistance with cooking/housework    Functional Status Assessment  Patient has had a recent decline in their functional status and demonstrates the ability to make significant improvements in function in a reasonable and predictable amount of time.  Equipment Recommendations  None recommended by OT       Precautions / Restrictions  Precautions Precautions: Fall;Knee Precaution Booklet Issued: Yes (comment) Precaution Comments: provided by PT Required Braces or Orthoses: Knee Immobilizer - Right Knee Immobilizer - Right: On when out of bed or walking;Other (comment) (no orders noted) Restrictions Weight Bearing Restrictions: Yes RLE Weight Bearing: Weight bearing as tolerated      Mobility Bed Mobility Overal bed mobility:  (in recliner upon therapy arrival)   Patient Response: Flat affect  Transfers Overall transfer level:  (See PT note)            ADL either performed or assessed with clinical judgement   ADL Overall ADL's : Needs assistance/impaired Eating/Feeding: Modified independent;Sitting   Grooming: Wash/dry face;Wash/dry hands;Oral care;Set up;Sitting   Upper Body Bathing: Set up;Sitting   Lower Body Bathing: Maximal assistance;Sit to/from stand;Sitting/lateral leans   Upper Body Dressing : Set up;Sitting   Lower Body Dressing: Maximal assistance;Sitting/lateral leans;Sit to/from stand   Toilet Transfer: Moderate assistance;Rolling walker (2 wheels)   Toileting- Clothing Manipulation and Hygiene: Moderate assistance;Sit to/from stand;Sitting/lateral lean               Vision Baseline Vision/History: 0 No visual deficits Ability to See in Adequate Light: 0 Adequate Patient Visual Report: No change from baseline Vision Assessment?: No apparent visual deficits            Pertinent Vitals/Pain Pain Assessment Pain Assessment: 0-10 Pain Score: 4  Pain Location: R knee (posterior) Pain Descriptors / Indicators: Grimacing, Operative site guarding, Tightness Pain Intervention(s): Limited activity within patient's tolerance, Monitored during session, Ice applied, Premedicated before session     Hand Dominance Right   Extremity/Trunk Assessment Upper Extremity Assessment Upper Extremity Assessment: Overall WFL for tasks assessed   Lower Extremity Assessment Lower  Extremity Assessment: Defer  to PT evaluation       Communication Communication Communication: No difficulties   Cognition Arousal/Alertness: Suspect due to medications, Awake/alert (seemed drowsy) Behavior During Therapy: WFL for tasks assessed/performed Overall Cognitive Status: Within Functional Limits for tasks assessed           General Comments: nauseous                Home Living Family/patient expects to be discharged to:: Private residence Living Arrangements: Spouse/significant other Available Help at Discharge: Family;Available 24 hours/day Type of Home: Mobile home Home Access: Stairs to enter Entrance Stairs-Number of Steps: 4-5 Entrance Stairs-Rails: Can reach both Home Layout: One level     Bathroom Shower/Tub: Occupational psychologist: Standard     Home Equipment: Conservation officer, nature (2 wheels);Shower seat;BSC/3in1;Cane - single point          Prior Functioning/Environment Prior Level of Function : Independent/Modified Independent     Mobility Comments: utilizing SPC vs RW for ambulation ADLs Comments: reports independent but struggled to walk        OT Problem List: Impaired balance (sitting and/or standing);Decreased strength      OT Treatment/Interventions: Self-care/ADL training;Therapeutic activities;Therapeutic exercise;Neuromuscular education;Energy conservation;Patient/family education;DME and/or AE instruction;Balance training;Manual therapy;Modalities    OT Goals(Current goals can be found in the care plan section) Acute Rehab OT Goals Patient Stated Goal: to go home OT Goal Formulation: With patient Time For Goal Achievement: 02/13/22 Potential to Achieve Goals: Good  OT Frequency: Min 2X/week       AM-PAC OT "6 Clicks" Daily Activity     Outcome Measure Help from another person eating meals?: None Help from another person taking care of personal grooming?: None Help from another person toileting, which includes using  toliet, bedpan, or urinal?: A Little Help from another person bathing (including washing, rinsing, drying)?: A Lot Help from another person to put on and taking off regular upper body clothing?: A Little Help from another person to put on and taking off regular lower body clothing?: Total 6 Click Score: 17   End of Session    Activity Tolerance: Other (comment) (limited by nausea) Patient left: in chair;with call bell/phone within reach  OT Visit Diagnosis: Muscle weakness (generalized) (M62.81);Unsteadiness on feet (R26.81)                Time: 1520-1530 OT Time Calculation (min): 10 min Charges:  OT General Charges $OT Visit: 1 Visit OT Evaluation $OT Eval Low Complexity: 1 Low  Ailene Ravel, OTR/L,CBIS  Supplemental OT - [facility] and [facility] Secure Chat Preferred    Emmette Katt, Clarene Duke 01/30/2022, 5:02 PM

## 2022-01-30 NOTE — Progress Notes (Signed)
Physical Therapy Treatment Patient Details Name: Becky [name] MRN: [id number] DOB: [birth year] Today's Date: 01/30/2022   History of Present Illness 62 y.o. female presents to [facility] hospital on 01/09/2022 for elective R TKA. PMH includes asthma, OA, GERD, HTN, HLD, migraines, OSA.    PT Comments    Pt was received supine in bed with daughter present and agreeable to therapy. Pt continued to be limited by nausea and dizziness upon sitting and standing, nursing was notified. Orthostatic vitals were obtained and not significant however pt with possible R eye nystagmus and may benefit from PT vestibular consult in follow-up sessions. Pt's daughter present for caregiver instruction for transfers and receptive, pt performed transfers and gait task at bedside with up to +2 min/modA. Pt up in chair at end of session, daughter present in room, RN notified of pt nausea/dizziness. Pt continues to benefit from PT services to progress toward functional mobility goals, will need PM session as she was not able to perform household distance gait trial or stair negotiation due to symptoms.    Recommendations for follow up therapy are one component of a multi-disciplinary discharge planning process, led by the attending physician.  Recommendations may be updated based on patient status, additional functional criteria and insurance authorization.  Follow Up Recommendations  Follow physician's recommendations for discharge plan and follow up therapies     Assistance Recommended at Discharge PRN  Patient can return home with the following A little help with bathing/dressing/bathroom;Assistance with cooking/housework;Assist for transportation;Help with stairs or ramp for entrance   Equipment Recommendations  None recommended by PT    Recommendations for Other Services       Precautions / Restrictions Precautions Precautions: Fall;Knee Precaution Booklet Issued: Yes (comment) Required Braces or Orthoses: Knee  Immobilizer - Right Knee Immobilizer - Right: On when out of bed or walking;Other (comment) (no orders noted) Restrictions Weight Bearing Restrictions: Yes RLE Weight Bearing: Weight bearing as tolerated     Mobility  Bed Mobility Overal bed mobility: Needs Assistance Bed Mobility: Supine to Sit     Supine to sit: Min guard          Transfers Overall transfer level: Needs assistance Equipment used: Rolling walker (2 wheels) Transfers: Sit to/from Stand Sit to Stand: Mod assist, +2 safety/equipment, +2 physical assistance                Ambulation/Gait Ambulation/Gait assistance: Min assist, +2 safety/equipment Gait Distance (Feet): 5 Feet Assistive device: Rolling walker (2 wheels) Gait Pattern/deviations: Step-to pattern, Shuffle, Trunk flexed Gait velocity: reduced     General Gait Details: Pt limited distance secondary to nausea      Balance Overall balance assessment: Needs assistance Sitting-balance support: No upper extremity supported, Feet supported Sitting balance-Leahy Scale: Good     Standing balance support: Bilateral upper extremity supported, Reliant on assistive device for balance Standing balance-Leahy Scale: Poor Standing balance comment: Heavily reliant on RW               Cognition Arousal/Alertness: Suspect due to medications, Awake/alert (awake but appeared drowsy. Pt able to keep eyes open) Behavior During Therapy: [facility] for tasks assessed/performed Overall Cognitive Status: Within Functional Limits for tasks assessed       General Comments: Pt distracted due to nausea, but able to follow simple commands        Exercises Total Joint Exercises Ankle Circles/Pumps: AROM, 10 reps Quad Sets: AROM, 10 reps Towel Squeeze: AROM, 5 reps Short Arc Quad: AAROM, 5 reps Heel Slides:  AAROM, 5 reps Goniometric ROM: R knee flexion ROM 15-65    General Comments General comments (skin integrity, edema, etc.): Pt nauseated; Sitting BP  119/50 (71) HR 68bpm; Standing BP 102/74 (83) HR 71 bpm SpO2 95% on RA; UTA standing 3 min BP due to increased nausea      Pertinent Vitals/Pain Pain Assessment Pain Assessment: 0-10 Pain Score: 4  Pain Location: R knee (posterior) Pain Descriptors / Indicators: Grimacing, Operative site guarding, Tightness Pain Intervention(s): Limited activity within patient's tolerance, Monitored during session, Ice applied, Premedicated before session     PT Goals (current goals can now be found in the care plan section) Acute Rehab PT Goals Patient Stated Goal: to return to independence PT Goal Formulation: With patient Time For Goal Achievement: 02/03/22 Potential to Achieve Goals: Good Progress towards PT goals: Progressing toward goals    Frequency    7X/week      PT Plan Current plan remains appropriate       AM-PAC PT "6 Clicks" Mobility   Outcome Measure  Help needed turning from your back to your side while in a flat bed without using bedrails?: A Little Help needed moving from lying on your back to sitting on the side of a flat bed without using bedrails?: A Little Help needed moving to and from a bed to a chair (including a wheelchair)?: A Lot Help needed standing up from a chair using your arms (e.g., wheelchair or bedside chair)?: A Lot Help needed to walk in hospital room?: A Little Help needed climbing 3-5 steps with a railing? : Total 6 Click Score: 14    End of Session Equipment Utilized During Treatment: Right knee immobilizer Activity Tolerance: Other (comment) (Pt limited by nausea, possible pain meds contributing) Patient left: with call bell/phone within reach;with family/visitor present;in chair;with nursing/sitter in room;Other (comment) (with ice pack on posterior knee) Nurse Communication: Mobility status PT Visit Diagnosis: Other abnormalities of gait and mobility (R26.89);Muscle weakness (generalized) (M62.81);Pain Pain - Right/Left: Right Pain - part  of body: Knee     Time: 5400-8676 PT Time Calculation (min) (ACUTE ONLY): 34 min  Charges:  $Therapeutic Exercise: 8-22 mins $Therapeutic Activity: 8-22 mins                     Joselle Deeds P., PTA Acute Rehabilitation Services Secure Chat Preferred 9a-5:30pm Office: [place] 01/30/2022, 11:19 AM

## 2022-01-31 ENCOUNTER — Other Ambulatory Visit: Payer: Self-pay

## 2022-01-31 ENCOUNTER — Telehealth: Payer: Self-pay

## 2022-01-31 DIAGNOSIS — Z91040 Latex allergy status: Secondary | ICD-10-CM | POA: Diagnosis not present

## 2022-01-31 DIAGNOSIS — E119 Type 2 diabetes mellitus without complications: Secondary | ICD-10-CM | POA: Diagnosis not present

## 2022-01-31 DIAGNOSIS — I480 Paroxysmal atrial fibrillation: Secondary | ICD-10-CM | POA: Diagnosis not present

## 2022-01-31 DIAGNOSIS — J45909 Unspecified asthma, uncomplicated: Secondary | ICD-10-CM | POA: Diagnosis not present

## 2022-01-31 DIAGNOSIS — E039 Hypothyroidism, unspecified: Secondary | ICD-10-CM | POA: Diagnosis not present

## 2022-01-31 DIAGNOSIS — Z87891 Personal history of nicotine dependence: Secondary | ICD-10-CM | POA: Diagnosis not present

## 2022-01-31 DIAGNOSIS — I2510 Atherosclerotic heart disease of native coronary artery without angina pectoris: Secondary | ICD-10-CM | POA: Diagnosis not present

## 2022-01-31 DIAGNOSIS — M1731 Unilateral post-traumatic osteoarthritis, right knee: Secondary | ICD-10-CM | POA: Diagnosis not present

## 2022-01-31 LAB — CBC
HCT: 27.7 % — ABNORMAL LOW (ref 36.0–46.0)
Hemoglobin: 9.4 g/dL — ABNORMAL LOW (ref 12.0–15.0)
MCH: 31.1 pg (ref 26.0–34.0)
MCHC: 33.9 g/dL (ref 30.0–36.0)
MCV: 91.7 fL (ref 80.0–100.0)
Platelets: 224 10*3/uL (ref 150–400)
RBC: 3.02 MIL/uL — ABNORMAL LOW (ref 3.87–5.11)
RDW: 13.5 % (ref 11.5–15.5)
WBC: 10.2 10*3/uL (ref 4.0–10.5)
nRBC: 0 % (ref 0.0–0.2)

## 2022-01-31 NOTE — Discharge Summary (Signed)
Discharge Diagnoses:  Principal Problem:   S/P TKR (total knee replacement), right   Surgeries: Procedure(s): RIGHT TOTAL KNEE ARTHROPLASTY on 01/29/2022    Consultants:   Discharged Condition: Improved  Hospital Course: Becky [name] is an 62 y.o. female who was admitted 01/29/2022 with a chief complaint of osteoarthritis right knee, with a final diagnosis of right knee osteoarthritis.  Patient was brought to the operating room on 01/29/2022 and underwent Procedure(s): RIGHT TOTAL KNEE ARTHROPLASTY.    Patient was given perioperative antibiotics:  Anti-infectives (From admission, onward)    Start     Dose/Rate Route Frequency Ordered Stop   01/29/22 1045  ceFAZolin (ANCEF) IVPB 2g/100 mL premix        2 g 200 mL/hr over 30 Minutes Intravenous On call to O.R. 01/29/22 1036 01/29/22 1320     .  Patient was given sequential compression devices, early ambulation, and aspirin for DVT prophylaxis.  Recent vital signs: Patient Vitals for the past 24 hrs:  BP Temp Temp src Pulse Resp SpO2  01/31/22 0402 (!) 122/56 98.7 F (37.1 C) Oral (!) 58 18 98 %  01/30/22 1920 (!) 138/57 98.8 F (37.1 C) Oral 66 20 99 %  01/30/22 1920 (!) 138/57 98.8 F (37.1 C) Oral 66 20 99 %  .  Recent laboratory studies: No results found.  Discharge Medications:   Allergies as of 01/31/2022       Reactions   Apresoline [hydralazine] Nausea Only   Latex    Other    Band aids discolor skin ekg pads irritate skin    Prednisone Swelling   Patient states that she can take methylprednisolone without complications        Medication List     STOP taking these medications    hydrocortisone 2.5 % rectal cream Commonly known as: Anusol-HC   promethazine-dextromethorphan 6.25-15 MG/5ML syrup Commonly known as: PROMETHAZINE-DM       TAKE these medications    albuterol 108 (90 Base) MCG/ACT inhaler Commonly known as: VENTOLIN HFA Inhale 2 puffs into the lungs every 4 (four) hours as needed  for wheezing or shortness of breath.   amoxicillin-clavulanate 875-125 MG tablet Commonly known as: AUGMENTIN Take 1 tablet by mouth 2 (two) times daily.   carboxymethylcellulose 0.5 % Soln Commonly known as: REFRESH PLUS Place 1 drop into both eyes daily as needed (irritation).   diltiazem 360 MG 24 hr capsule Commonly known as: CARDIZEM CD TAKE 1 CAPSULE BY MOUTH EVERY DAY What changed:  how much to take how to take this when to take this additional instructions   EPINEPHrine 0.3 mg/0.3 mL Soaj injection Commonly known as: EPI-PEN Inject 0.3 mg into the muscle once as needed for anaphylaxis.   FISH OIL PO Take 1 capsule by mouth 3 (three) times a week.   fluticasone 50 MCG/ACT nasal spray Commonly known as: FLONASE Place 2 sprays into both nostrils daily. What changed:  when to take this reasons to take this   furosemide 40 MG tablet Commonly known as: Lasix Take 1 tablet (40 mg total) by mouth daily as needed.   lubiprostone 24 MCG capsule Commonly known as: AMITIZA TAKE 1 CAPSULE (24 MCG TOTAL) BY MOUTH 2 (TWO) TIMES DAILY WITH A MEAL. What changed: when to take this   methocarbamol 500 MG tablet Commonly known as: ROBAXIN Take 1 tablet (500 mg total) by mouth 4 (four) times daily. What changed:  when to take this reasons to take this   metoprolol tartrate 50  MG tablet Commonly known as: LOPRESSOR Take 1 tablet (50 mg total) by mouth 2 (two) times daily.   montelukast 10 MG tablet Commonly known as: SINGULAIR Take 10 mg by mouth at bedtime.   NON FORMULARY Pt uses a cpap nightly   olmesartan 40 MG tablet Commonly known as: Benicar Take 1 tablet (40 mg total) by mouth daily.   ondansetron 4 MG tablet Commonly known as: Zofran Take 1 tablet (4 mg total) by mouth every 8 (eight) hours as needed for nausea or vomiting.   oxyCODONE-acetaminophen 5-325 MG tablet Commonly known as: Percocet Take 1-2 tablets by mouth every 4 (four) hours as needed for  severe pain. What changed: how much to take   pantoprazole 40 MG tablet Commonly known as: PROTONIX TAKE 1 TABLET (40 MG TOTAL) BY MOUTH DAILY TAKE 30 MINUTES BEFORE BREAKFAST   QC TUMERIC COMPLEX PO Take 1,500 mg by mouth 3 (three) times a week.   rivaroxaban 20 MG Tabs tablet Commonly known as: Xarelto Take 1 tablet (20 mg total) by mouth daily.   rosuvastatin 10 MG tablet Commonly known as: CRESTOR Take 10 mg by mouth in the morning.   spironolactone 25 MG tablet Commonly known as: ALDACTONE TAKE 1 TABLET BY MOUTH EVERY DAY   VITAMIN D-3 PO Take 1 tablet by mouth 3 (three) times a week.   Xiidra 5 % Soln Generic drug: Lifitegrast Place 1 drop into both eyes 2 (two) times daily as needed (dry/irritated eyes.).        Diagnostic Studies: US Venous Img Lower Unilateral Right  Result Date: 01/07/2022 CLINICAL DATA:  Right knee and calf pain for 4 days EXAM: RIGHT LOWER EXTREMITY VENOUS DOPPLER ULTRASOUND TECHNIQUE: Gray-scale sonography with graded compression, as well as color Doppler and duplex ultrasound were performed to evaluate the lower extremity deep venous systems from the level of the common femoral vein and including the common femoral, femoral, profunda femoral, popliteal and calf veins including the posterior tibial, peroneal and gastrocnemius veins when visible. Spectral Doppler was utilized to evaluate flow at rest and with distal augmentation maneuvers in the common femoral, femoral and popliteal veins. COMPARISON:  None Available. FINDINGS: Contralateral Common Femoral Vein: Respiratory phasicity is normal and symmetric with the symptomatic side. No evidence of thrombus. Normal compressibility. Common Femoral Vein: No evidence of thrombus. Normal compressibility, respiratory phasicity and response to augmentation. Saphenofemoral Junction: No evidence of thrombus. Normal compressibility and flow on color Doppler imaging. Profunda Femoral Vein: No evidence of thrombus.  Normal compressibility and flow on color Doppler imaging. Femoral Vein: No evidence of thrombus. Normal compressibility, respiratory phasicity and response to augmentation. Popliteal Vein: No evidence of thrombus. Normal compressibility, respiratory phasicity and response to augmentation. Calf Veins: No evidence of thrombus. Normal compressibility and flow on color Doppler imaging. IMPRESSION: No evidence of deep venous thrombosis. Electronically Signed   By: Jerilynn Mages.  Shick M.D.   On: 01/07/2022 16:04    Patient benefited maximally from their hospital stay and there were no complications.     Disposition: Discharge disposition: 01-Home or Self Care      Discharge Instructions     Call MD / Call 911   Complete by: As directed    If you experience chest pain or shortness of breath, CALL 911 and be transported to the hospital emergency room.  If you develope a fever above 101 F, pus (white drainage) or increased drainage or redness at the wound, or calf pain, call your surgeon's office.  Call MD / Call 911   Complete by: As directed    If you experience chest pain or shortness of breath, CALL 911 and be transported to the hospital emergency room.  If you develope a fever above 101 F, pus (white drainage) or increased drainage or redness at the wound, or calf pain, call your surgeon's office.   Constipation Prevention   Complete by: As directed    Drink plenty of fluids.  Prune juice may be helpful.  You may use a stool softener, such as Colace (over the counter) 100 mg twice a day.  Use MiraLax (over the counter) for constipation as needed.   Constipation Prevention   Complete by: As directed    Drink plenty of fluids.  Prune juice may be helpful.  You may use a stool softener, such as Colace (over the counter) 100 mg twice a day.  Use MiraLax (over the counter) for constipation as needed.   Diet - low sodium heart healthy   Complete by: As directed    Diet - low sodium heart healthy   Complete  by: As directed    Do not put a pillow under the knee. Place it under the heel.   Complete by: As directed    Do not put a pillow under the knee. Place it under the heel.   Complete by: As directed    Increase activity slowly as tolerated   Complete by: As directed    Increase activity slowly as tolerated   Complete by: As directed    Post-operative opioid taper instructions:   Complete by: As directed    POST-OPERATIVE OPIOID TAPER INSTRUCTIONS: It is important to wean off of your opioid medication as soon as possible. If you do not need pain medication after your surgery it is ok to stop day one. Opioids include: Codeine, Hydrocodone(Norco, Vicodin), Oxycodone(Percocet, oxycontin) and hydromorphone amongst others.  Long term and even short term use of opiods can cause: Increased pain response Dependence Constipation Depression Respiratory depression And more.  Withdrawal symptoms can include Flu like symptoms Nausea, vomiting And more Techniques to manage these symptoms Hydrate well Eat regular healthy meals Stay active Use relaxation techniques(deep breathing, meditating, yoga) Do Not substitute Alcohol to help with tapering If you have been on opioids for less than two weeks and do not have pain than it is ok to stop all together.  Plan to wean off of opioids This plan should start within one week post op of your joint replacement. Maintain the same interval or time between taking each dose and first decrease the dose.  Cut the total daily intake of opioids by one tablet each day Next start to increase the time between doses. The last dose that should be eliminated is the evening dose.      Post-operative opioid taper instructions:   Complete by: As directed    POST-OPERATIVE OPIOID TAPER INSTRUCTIONS: It is important to wean off of your opioid medication as soon as possible. If you do not need pain medication after your surgery it is ok to stop day one. Opioids  include: Codeine, Hydrocodone(Norco, Vicodin), Oxycodone(Percocet, oxycontin) and hydromorphone amongst others.  Long term and even short term use of opiods can cause: Increased pain response Dependence Constipation Depression Respiratory depression And more.  Withdrawal symptoms can include Flu like symptoms Nausea, vomiting And more Techniques to manage these symptoms Hydrate well Eat regular healthy meals Stay active Use relaxation techniques(deep breathing, meditating, yoga) Do Not substitute Alcohol  to help with tapering If you have been on opioids for less than two weeks and do not have pain than it is ok to stop all together.  Plan to wean off of opioids This plan should start within one week post op of your joint replacement. Maintain the same interval or time between taking each dose and first decrease the dose.  Cut the total daily intake of opioids by one tablet each day Next start to increase the time between doses. The last dose that should be eliminated is the evening dose.          Follow-up Information     Redmond School, MD Follow up.   Specialty: Internal Medicine Contact information: [address] [place] [address] [phone]         [name], MD Follow up in 1 week(s).   Specialty: Orthopedic Surgery Contact information: [address] [place] [address] [phone]                  Signed: Bevely Palmer Lion Fernandez 01/31/2022, 8:20 AM

## 2022-01-31 NOTE — Telephone Encounter (Signed)
Patient aware of the below message  

## 2022-01-31 NOTE — Telephone Encounter (Signed)
Nurse called from the hospital stating patient is saying she is to wear the knee immobilizer all the time. Nurse told her she dies not think that is the case since she just had a replacement she needs to move her knee but told her she would call you and let you let her know what she needs to do

## 2022-01-31 NOTE — Progress Notes (Signed)
Physical Therapy Treatment Patient Details Name: Becky [name] MRN: [id number] DOB: 1960-12-20 Today's Date: 01/31/2022   History of Present Illness 62 y.o. female presents to [facility] hospital on 01/09/2022 for elective R TKA. PMH includes asthma, OA, GERD, HTN, HLD, migraines, OSA.    PT Comments    Pt is progressing well towards goals. Decreased nausea and dizziness this session; did not proceed with vestibular evaluation. Pt is Min A to CGA for all functional mobility. Due to pt current functional status, home set up and available assistance at home recommending skilled physical therapy services per MD recommendation on discharge from acute care hospital setting. No signs/symptoms of distress during session. From PT standpoint pt is cleared for discharge home pending medical stability.    Recommendations for follow up therapy are one component of a multi-disciplinary discharge planning process, led by the attending physician.  Recommendations may be updated based on patient status, additional functional criteria and insurance authorization.  Follow Up Recommendations  Follow physician's recommendations for discharge plan and follow up therapies     Assistance Recommended at Discharge PRN  Patient can return home with the following Assistance with cooking/housework;Assist for transportation;Help with stairs or ramp for entrance;A little help with walking and/or transfers   Equipment Recommendations  None recommended by PT    Recommendations for Other Services       Precautions / Restrictions Precautions Precautions: Fall;Knee Required Braces or Orthoses: Knee Immobilizer - Right Knee Immobilizer - Right: On when out of bed or walking;Other (comment) (no orders noted contacted MD office for clarification for pt. LVM) Restrictions Weight Bearing Restrictions: Yes RLE Weight Bearing: Weight bearing as tolerated     Mobility  Bed Mobility Overal bed mobility: Needs Assistance Bed  Mobility: Supine to Sit, Sit to Supine     Supine to sit: Supervision Sit to supine: Min guard   General bed mobility comments: Pt required increased time to perform supine to sitting and is able to perform sitting to supine without physical assistance but has more difficulty getting the RLE up into the bed. Patient Response: Cooperative  Transfers Overall transfer level: Needs assistance Equipment used: Rolling walker (2 wheels) Transfers: Sit to/from Stand Sit to Stand: Min guard, From elevated surface           General transfer comment: No cues required for hand placement. Pt requires increased time to get into full trunk extension    Ambulation/Gait Ambulation/Gait assistance: Min guard Gait Distance (Feet): 60 Feet Assistive device: Rolling walker (2 wheels) Gait Pattern/deviations: Step-to pattern, Decreased stance time - right, Antalgic Gait velocity: Decreased cadence Gait velocity interpretation: <1.8 ft/sec, indicate of risk for recurrent falls   General Gait Details: Pt is reporting less nausea and vomiting this session. Verbal cues for sequencing in order to decrease pain in the RLE.        Balance Overall balance assessment: Needs assistance Sitting-balance support: No upper extremity supported, Feet supported Sitting balance-Leahy Scale: Normal     Standing balance support: Bilateral upper extremity supported, Reliant on assistive device for balance Standing balance-Leahy Scale: Fair Standing balance comment: Mod dependence on RW, pt is unsure with steps and stops/starts occasionally. No overt LOB.        Cognition Arousal/Alertness: Awake/alert Behavior During Therapy: WFL for tasks assessed/performed Overall Cognitive Status: Within Functional Limits for tasks assessed            Exercises Total Joint Exercises Goniometric ROM: R knee flexion 52, R knee extension -13  General Comments General comments (skin integrity, edema, etc.):  Discussed nausea/dizziness with pt which does not seem to be related to the inner ear but most likely related to pain medication. Pt states that it has improved significantly with less pain medication. No dizziness/nausea limiting session today.      Pertinent Vitals/Pain Pain Assessment Pain Assessment: 0-10 Pain Score: 6  Pain Location: R knee Pain Descriptors / Indicators: Aching Pain Intervention(s): Limited activity within patient's tolerance, Monitored during session, Premedicated before session     PT Goals (current goals can now be found in the care plan section) Acute Rehab PT Goals Patient Stated Goal: to return to independence PT Goal Formulation: With patient Time For Goal Achievement: 02/03/22 Potential to Achieve Goals: Good Progress towards PT goals: Progressing toward goals    Frequency    7X/week      PT Plan Current plan remains appropriate       AM-PAC PT "6 Clicks" Mobility   Outcome Measure  Help needed turning from your back to your side while in a flat bed without using bedrails?: A Little Help needed moving from lying on your back to sitting on the side of a flat bed without using bedrails?: A Little Help needed moving to and from a bed to a chair (including a wheelchair)?: A Little Help needed standing up from a chair using your arms (e.g., wheelchair or bedside chair)?: A Little Help needed to walk in hospital room?: A Little Help needed climbing 3-5 steps with a railing? : A Lot 6 Click Score: 17    End of Session Equipment Utilized During Treatment: Right knee immobilizer Activity Tolerance: Patient tolerated treatment well Patient left: with call bell/phone within reach;in bed Nurse Communication: Mobility status PT Visit Diagnosis: Other abnormalities of gait and mobility (R26.89);Muscle weakness (generalized) (M62.81);Pain Pain - Right/Left: Right Pain - part of body: Knee     Time: 3570-1779 PT Time Calculation (min) (ACUTE ONLY):  28 min  Charges:  $Gait Training: 8-22 mins $Therapeutic Activity: 8-22 mins                     Tomma Rakers, DPT, CLT  Acute Rehabilitation Services Office: [phone] (Secure chat preferred)    Ander Purpura 01/31/2022, 10:46 AM

## 2022-01-31 NOTE — Progress Notes (Signed)
Subjective: POD 2 R TKA. Working with PT ready to [place]. Some Nausea with pain meds. Denies fever chills or calf pain   Objective: Vital signs in last 24 hours: Temp:  [98.6 F (37 C)-98.8 F (37.1 C)] 98.7 F (37.1 C) (01/26 0402) Pulse Rate:  [58-72] 58 (01/26 0402) Resp:  [18-20] 18 (01/26 0402) BP: (120-138)/(56-74) 122/56 (01/26 0402) SpO2:  [98 %-100 %] 98 % (01/26 0402)  Intake/Output from previous day: 01/25 0701 - 01/26 0700 In: 1128.6 [I.V.:1128.6] Out: -  Intake/Output this shift: No intake/output data recorded.  Recent Labs    01/30/22 0239 01/31/22 0455  HGB 9.8* 9.4*   Recent Labs    01/30/22 0239 01/31/22 0455  WBC 10.2 10.2  RBC 3.14* 3.02*  HCT 28.8* 27.7*  PLT 256 224   Recent Labs    01/30/22 0239  NA 134*  K 3.9  CL 104  CO2 25  BUN 12  CREATININE 0.93  GLUCOSE 118*  CALCIUM 8.4*   No results for input(s): "LABPT", "INR" in the last 72 hours.  Neurovascular intact Sensation intact distally Intact pulses distally Dorsiflexion/Plantar flexion intact Incision: dressing C/D/I No cellulitis present Compartment soft    Assessment/Plan: Postop day #2 status post right total knee arthroplasty she is overall doing well.  She will be discharged to the care of her family later today.  She understands strict precautions including if she has any fever chills calf pain increasing pain redness around the incision.  Will follow-up with Dr. Inda Merlin in 1 week   Patient's anticipated LOS is less than 2 midnights, meeting these requirements: - Younger than 29 - Lives within 1 hour of care - Has a competent adult at home to recover with post-op recover - NO history of  - Chronic pain requiring opiods  - Diabetes  - Coronary Artery Disease  - Heart failure  - Heart attack  - Stroke  - DVT/VTE  - Cardiac arrhythmia  - Respiratory Failure/COPD  - Renal failure  - Anemia  - Advanced Liver disease     Bevely Palmer Bradyn Vassey 01/31/2022, 8:07  AM

## 2022-01-31 NOTE — Progress Notes (Cosign Needed)
Durable Medical Equipment  (From admission, onward)           Start     Ordered   01/31/22 0950  For home use only DME Bedside commode  Once       Comments: Drop arm . Confined to one room  Question:  Patient needs a bedside commode to treat with the following condition  Answer:  S/P TKR (total knee replacement)   01/31/22 1572

## 2022-01-31 NOTE — TOC Transition Note (Addendum)
Transition of Care [facility]) - CM/SW Discharge Note   Patient Details  Name: Becky [name] MRN: [id number] Date of Birth: 1960-06-01  Transition of Care [facility]) CM/SW Contact:  Sharin Mons, RN Phone Number: 01/31/2022, 8:44 AM   Clinical Narrative:    Patient will DC to: home Anticipated DC date: 01/31/2022 Family notified: yes Transport by: car            S/p R TKA, 1/4 Per MD patient ready for DC today. RN, patient, patient's family, and facility notified of DC. Order noted for home health services. Pt agreeable. Pt with provider preference. Referral made with [facility] and accepted. Pt without DME needs. Has @ home RW, BSC. States daughter to assist with care once d/c to home. Pt without RX med concerns. Post hospital f/u noted in AVS. Daughter to provide transportation to home .  RNCM will sign off for now as intervention is no longer needed. Please consult [place] again if new needs arise.    1/26 -  DME need noted for BSC. Referral made and accepted with [facility] . Equipment will be delivered to pt's home by Monday, NCM made pt aware.   Final next level of care: [place] Barriers to Discharge: No Barriers Identified   Patient Goals and CMS Choice   Choice offered to / list presented to : Patient  Discharge Placement                         Discharge Plan and Services Additional resources added to the After Visit Summary for                            [facility] Arranged: PT HH Agency: [place] Date [place]: 01/31/22 Time [place]: [phone] Representative spoke with at [place]: Christine Determinants of Health ([place]) Interventions [place]: Food Insecurity Present (01/22/2022)  Housing: Low Risk  (12/06/2021)  Transportation Needs: No Transportation Needs (01/21/2022)  Utilities: Not At Risk (01/21/2022)  Alcohol Screen: Low Risk  (07/30/2021)  Depression  (PHQ2-9): Low Risk  (11/21/2021)  Financial Resource Strain: Medium Risk (01/22/2022)  Physical Activity: Inactive (01/21/2022)  Social Connections: Socially Integrated (07/30/2021)  Stress: No Stress Concern Present (07/30/2021)  Tobacco Use: Medium Risk (01/30/2022)     Readmission Risk Interventions     No data to display

## 2022-02-02 DIAGNOSIS — M722 Plantar fascial fibromatosis: Secondary | ICD-10-CM | POA: Diagnosis not present

## 2022-02-02 DIAGNOSIS — I8391 Asymptomatic varicose veins of right lower extremity: Secondary | ICD-10-CM | POA: Diagnosis not present

## 2022-02-02 DIAGNOSIS — E119 Type 2 diabetes mellitus without complications: Secondary | ICD-10-CM | POA: Diagnosis not present

## 2022-02-02 DIAGNOSIS — G43909 Migraine, unspecified, not intractable, without status migrainosus: Secondary | ICD-10-CM | POA: Diagnosis not present

## 2022-02-02 DIAGNOSIS — D509 Iron deficiency anemia, unspecified: Secondary | ICD-10-CM | POA: Diagnosis not present

## 2022-02-02 DIAGNOSIS — M1712 Unilateral primary osteoarthritis, left knee: Secondary | ICD-10-CM | POA: Diagnosis not present

## 2022-02-02 DIAGNOSIS — I480 Paroxysmal atrial fibrillation: Secondary | ICD-10-CM | POA: Diagnosis not present

## 2022-02-02 DIAGNOSIS — E7800 Pure hypercholesterolemia, unspecified: Secondary | ICD-10-CM | POA: Diagnosis not present

## 2022-02-02 DIAGNOSIS — E041 Nontoxic single thyroid nodule: Secondary | ICD-10-CM | POA: Diagnosis not present

## 2022-02-02 DIAGNOSIS — I2510 Atherosclerotic heart disease of native coronary artery without angina pectoris: Secondary | ICD-10-CM | POA: Diagnosis not present

## 2022-02-02 DIAGNOSIS — I10 Essential (primary) hypertension: Secondary | ICD-10-CM | POA: Diagnosis not present

## 2022-02-02 DIAGNOSIS — E039 Hypothyroidism, unspecified: Secondary | ICD-10-CM | POA: Diagnosis not present

## 2022-02-02 DIAGNOSIS — M47816 Spondylosis without myelopathy or radiculopathy, lumbar region: Secondary | ICD-10-CM | POA: Diagnosis not present

## 2022-02-02 DIAGNOSIS — Z471 Aftercare following joint replacement surgery: Secondary | ICD-10-CM | POA: Diagnosis not present

## 2022-02-02 DIAGNOSIS — Z96651 Presence of right artificial knee joint: Secondary | ICD-10-CM | POA: Diagnosis not present

## 2022-02-02 DIAGNOSIS — J45909 Unspecified asthma, uncomplicated: Secondary | ICD-10-CM | POA: Diagnosis not present

## 2022-02-03 ENCOUNTER — Telehealth: Payer: Self-pay

## 2022-02-03 DIAGNOSIS — G4733 Obstructive sleep apnea (adult) (pediatric): Secondary | ICD-10-CM | POA: Diagnosis not present

## 2022-02-03 NOTE — Telephone Encounter (Signed)
Patient called [place] office this a.m. concerning her husband's FMLA paperwork.  He needs to have it changed because her daughter is working out of town and he is the only one to take care of her so it needs to be extended.  Her number is [phone].   Ok to extend?

## 2022-02-04 NOTE — Telephone Encounter (Signed)
Paperwork completed. Patient aware.

## 2022-02-05 ENCOUNTER — Telehealth: Payer: Self-pay | Admitting: Cardiology

## 2022-02-05 DIAGNOSIS — E039 Hypothyroidism, unspecified: Secondary | ICD-10-CM | POA: Diagnosis not present

## 2022-02-05 DIAGNOSIS — M722 Plantar fascial fibromatosis: Secondary | ICD-10-CM | POA: Diagnosis not present

## 2022-02-05 DIAGNOSIS — M1712 Unilateral primary osteoarthritis, left knee: Secondary | ICD-10-CM | POA: Diagnosis not present

## 2022-02-05 DIAGNOSIS — D509 Iron deficiency anemia, unspecified: Secondary | ICD-10-CM | POA: Diagnosis not present

## 2022-02-05 DIAGNOSIS — I2510 Atherosclerotic heart disease of native coronary artery without angina pectoris: Secondary | ICD-10-CM | POA: Diagnosis not present

## 2022-02-05 DIAGNOSIS — E119 Type 2 diabetes mellitus without complications: Secondary | ICD-10-CM | POA: Diagnosis not present

## 2022-02-05 DIAGNOSIS — Z471 Aftercare following joint replacement surgery: Secondary | ICD-10-CM | POA: Diagnosis not present

## 2022-02-05 DIAGNOSIS — I10 Essential (primary) hypertension: Secondary | ICD-10-CM | POA: Diagnosis not present

## 2022-02-05 DIAGNOSIS — E7800 Pure hypercholesterolemia, unspecified: Secondary | ICD-10-CM | POA: Diagnosis not present

## 2022-02-05 DIAGNOSIS — M47816 Spondylosis without myelopathy or radiculopathy, lumbar region: Secondary | ICD-10-CM | POA: Diagnosis not present

## 2022-02-05 DIAGNOSIS — I8391 Asymptomatic varicose veins of right lower extremity: Secondary | ICD-10-CM | POA: Diagnosis not present

## 2022-02-05 DIAGNOSIS — I480 Paroxysmal atrial fibrillation: Secondary | ICD-10-CM | POA: Diagnosis not present

## 2022-02-05 DIAGNOSIS — Z96651 Presence of right artificial knee joint: Secondary | ICD-10-CM | POA: Diagnosis not present

## 2022-02-05 DIAGNOSIS — G43909 Migraine, unspecified, not intractable, without status migrainosus: Secondary | ICD-10-CM | POA: Diagnosis not present

## 2022-02-05 DIAGNOSIS — E041 Nontoxic single thyroid nodule: Secondary | ICD-10-CM | POA: Diagnosis not present

## 2022-02-05 DIAGNOSIS — J45909 Unspecified asthma, uncomplicated: Secondary | ICD-10-CM | POA: Diagnosis not present

## 2022-02-05 MED ORDER — RIVAROXABAN 20 MG PO TABS: 20.0000 mg | ORAL_TABLET | Freq: Every day | ORAL | 1 refills | Status: DC

## 2022-02-05 NOTE — Telephone Encounter (Signed)
Prescription refill request for Xarelto received.  Indication: PAF Last office visit: 09/26/21  B Strader PA-C Weight: 122.9kg Age: 62 Scr: 0.93 on 01/30/22 CrCl: 125.03  Based on above findings Xarelto '20mg'$  daily is the appropriate dose.  Refill approved.

## 2022-02-05 NOTE — Telephone Encounter (Signed)
*  STAT* If patient is at the pharmacy, call can be transferred to refill team.   1. Which medications need to be refilled? (please list name of each medication and dose if known)   rivaroxaban (XARELTO) 20 MG TABS tablet    2. Which pharmacy/location (including street and city if local pharmacy) is medication to be sent to? [facility] [place] - [address]   3. Do they need a 30 day or 90 day supply? 90 days

## 2022-02-06 ENCOUNTER — Ambulatory Visit: Payer: Self-pay

## 2022-02-06 ENCOUNTER — Encounter: Payer: Self-pay | Admitting: Orthopaedic Surgery

## 2022-02-06 ENCOUNTER — Ambulatory Visit (INDEPENDENT_AMBULATORY_CARE_PROVIDER_SITE_OTHER): Payer: BC Managed Care – PPO | Admitting: Orthopaedic Surgery

## 2022-02-06 ENCOUNTER — Ambulatory Visit: Payer: BC Managed Care – PPO | Admitting: Gastroenterology

## 2022-02-06 VITALS — Ht 67.0 in | Wt 263.0 lb

## 2022-02-06 DIAGNOSIS — Z96651 Presence of right artificial knee joint: Secondary | ICD-10-CM

## 2022-02-06 DIAGNOSIS — E119 Type 2 diabetes mellitus without complications: Secondary | ICD-10-CM | POA: Diagnosis not present

## 2022-02-06 DIAGNOSIS — D509 Iron deficiency anemia, unspecified: Secondary | ICD-10-CM | POA: Diagnosis not present

## 2022-02-06 DIAGNOSIS — I8391 Asymptomatic varicose veins of right lower extremity: Secondary | ICD-10-CM | POA: Diagnosis not present

## 2022-02-06 DIAGNOSIS — Z471 Aftercare following joint replacement surgery: Secondary | ICD-10-CM | POA: Diagnosis not present

## 2022-02-06 DIAGNOSIS — E039 Hypothyroidism, unspecified: Secondary | ICD-10-CM | POA: Diagnosis not present

## 2022-02-06 DIAGNOSIS — M1712 Unilateral primary osteoarthritis, left knee: Secondary | ICD-10-CM | POA: Diagnosis not present

## 2022-02-06 DIAGNOSIS — J45909 Unspecified asthma, uncomplicated: Secondary | ICD-10-CM | POA: Diagnosis not present

## 2022-02-06 DIAGNOSIS — E041 Nontoxic single thyroid nodule: Secondary | ICD-10-CM | POA: Diagnosis not present

## 2022-02-06 DIAGNOSIS — I10 Essential (primary) hypertension: Secondary | ICD-10-CM | POA: Diagnosis not present

## 2022-02-06 DIAGNOSIS — M47816 Spondylosis without myelopathy or radiculopathy, lumbar region: Secondary | ICD-10-CM | POA: Diagnosis not present

## 2022-02-06 DIAGNOSIS — G43909 Migraine, unspecified, not intractable, without status migrainosus: Secondary | ICD-10-CM | POA: Diagnosis not present

## 2022-02-06 DIAGNOSIS — E7800 Pure hypercholesterolemia, unspecified: Secondary | ICD-10-CM | POA: Diagnosis not present

## 2022-02-06 DIAGNOSIS — I2510 Atherosclerotic heart disease of native coronary artery without angina pectoris: Secondary | ICD-10-CM | POA: Diagnosis not present

## 2022-02-06 DIAGNOSIS — M722 Plantar fascial fibromatosis: Secondary | ICD-10-CM | POA: Diagnosis not present

## 2022-02-06 DIAGNOSIS — I480 Paroxysmal atrial fibrillation: Secondary | ICD-10-CM | POA: Diagnosis not present

## 2022-02-06 MED ORDER — ONDANSETRON HCL 4 MG PO TABS: 4.0000 mg | ORAL_TABLET | Freq: Three times a day (TID) | ORAL | 1 refills | Status: DC | PRN

## 2022-02-06 MED ORDER — OXYCODONE-ACETAMINOPHEN 5-325 MG PO TABS: 1.0000 | ORAL_TABLET | ORAL | 0 refills | Status: DC | PRN

## 2022-02-06 NOTE — Addendum Note (Signed)
Addended by: Meyer Cory on: 02/06/2022 10:54 AM   Modules accepted: Orders
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
## 2022-02-07 ENCOUNTER — Encounter: Payer: Self-pay | Admitting: Gastroenterology

## 2022-02-07 DIAGNOSIS — G43909 Migraine, unspecified, not intractable, without status migrainosus: Secondary | ICD-10-CM | POA: Diagnosis not present

## 2022-02-07 DIAGNOSIS — E041 Nontoxic single thyroid nodule: Secondary | ICD-10-CM | POA: Diagnosis not present

## 2022-02-07 DIAGNOSIS — E039 Hypothyroidism, unspecified: Secondary | ICD-10-CM | POA: Diagnosis not present

## 2022-02-07 DIAGNOSIS — M47816 Spondylosis without myelopathy or radiculopathy, lumbar region: Secondary | ICD-10-CM | POA: Diagnosis not present

## 2022-02-07 DIAGNOSIS — E119 Type 2 diabetes mellitus without complications: Secondary | ICD-10-CM | POA: Diagnosis not present

## 2022-02-07 DIAGNOSIS — M722 Plantar fascial fibromatosis: Secondary | ICD-10-CM | POA: Diagnosis not present

## 2022-02-07 DIAGNOSIS — E7800 Pure hypercholesterolemia, unspecified: Secondary | ICD-10-CM | POA: Diagnosis not present

## 2022-02-07 DIAGNOSIS — M1712 Unilateral primary osteoarthritis, left knee: Secondary | ICD-10-CM | POA: Diagnosis not present

## 2022-02-07 DIAGNOSIS — I10 Essential (primary) hypertension: Secondary | ICD-10-CM | POA: Diagnosis not present

## 2022-02-07 DIAGNOSIS — D509 Iron deficiency anemia, unspecified: Secondary | ICD-10-CM | POA: Diagnosis not present

## 2022-02-07 DIAGNOSIS — I480 Paroxysmal atrial fibrillation: Secondary | ICD-10-CM | POA: Diagnosis not present

## 2022-02-07 DIAGNOSIS — Z96651 Presence of right artificial knee joint: Secondary | ICD-10-CM | POA: Diagnosis not present

## 2022-02-07 DIAGNOSIS — Z471 Aftercare following joint replacement surgery: Secondary | ICD-10-CM | POA: Diagnosis not present

## 2022-02-07 DIAGNOSIS — I2510 Atherosclerotic heart disease of native coronary artery without angina pectoris: Secondary | ICD-10-CM | POA: Diagnosis not present

## 2022-02-07 DIAGNOSIS — I8391 Asymptomatic varicose veins of right lower extremity: Secondary | ICD-10-CM | POA: Diagnosis not present

## 2022-02-07 DIAGNOSIS — J45909 Unspecified asthma, uncomplicated: Secondary | ICD-10-CM | POA: Diagnosis not present

## 2022-02-09 DIAGNOSIS — G4733 Obstructive sleep apnea (adult) (pediatric): Secondary | ICD-10-CM | POA: Diagnosis not present

## 2022-02-09 DIAGNOSIS — I2510 Atherosclerotic heart disease of native coronary artery without angina pectoris: Secondary | ICD-10-CM | POA: Diagnosis not present

## 2022-02-09 DIAGNOSIS — E119 Type 2 diabetes mellitus without complications: Secondary | ICD-10-CM | POA: Diagnosis not present

## 2022-02-09 DIAGNOSIS — M47816 Spondylosis without myelopathy or radiculopathy, lumbar region: Secondary | ICD-10-CM | POA: Diagnosis not present

## 2022-02-09 DIAGNOSIS — Z471 Aftercare following joint replacement surgery: Secondary | ICD-10-CM | POA: Diagnosis not present

## 2022-02-09 DIAGNOSIS — G43909 Migraine, unspecified, not intractable, without status migrainosus: Secondary | ICD-10-CM | POA: Diagnosis not present

## 2022-02-09 DIAGNOSIS — I8391 Asymptomatic varicose veins of right lower extremity: Secondary | ICD-10-CM | POA: Diagnosis not present

## 2022-02-09 DIAGNOSIS — I10 Essential (primary) hypertension: Secondary | ICD-10-CM | POA: Diagnosis not present

## 2022-02-09 DIAGNOSIS — E041 Nontoxic single thyroid nodule: Secondary | ICD-10-CM | POA: Diagnosis not present

## 2022-02-09 DIAGNOSIS — Z96651 Presence of right artificial knee joint: Secondary | ICD-10-CM | POA: Diagnosis not present

## 2022-02-09 DIAGNOSIS — M722 Plantar fascial fibromatosis: Secondary | ICD-10-CM | POA: Diagnosis not present

## 2022-02-09 DIAGNOSIS — E039 Hypothyroidism, unspecified: Secondary | ICD-10-CM | POA: Diagnosis not present

## 2022-02-09 DIAGNOSIS — J45909 Unspecified asthma, uncomplicated: Secondary | ICD-10-CM | POA: Diagnosis not present

## 2022-02-09 DIAGNOSIS — D509 Iron deficiency anemia, unspecified: Secondary | ICD-10-CM | POA: Diagnosis not present

## 2022-02-09 DIAGNOSIS — I480 Paroxysmal atrial fibrillation: Secondary | ICD-10-CM | POA: Diagnosis not present

## 2022-02-09 DIAGNOSIS — E7800 Pure hypercholesterolemia, unspecified: Secondary | ICD-10-CM | POA: Diagnosis not present

## 2022-02-09 DIAGNOSIS — M1712 Unilateral primary osteoarthritis, left knee: Secondary | ICD-10-CM | POA: Diagnosis not present

## 2022-02-10 ENCOUNTER — Telehealth: Payer: Self-pay

## 2022-02-10 DIAGNOSIS — M25561 Pain in right knee: Secondary | ICD-10-CM | POA: Diagnosis not present

## 2022-02-10 NOTE — Progress Notes (Signed)
[facility])  [place] Team    02/10/2022  Becky [name] 1960-11-04 [id number]  Reason for referral: Medication Assistance  Referral source:  [place] Current insurance: [employer] [employer]   Outreach:  Successful telephone call with patient.  HIPAA identifiers verified.   Subjective:  I reached out to the patient today to verify whether or not she still had [employer], so that I may assist her with medication assistance for pantoprazole and diltiazem. The patient confirmed that she still has her insurance, so she is not eligible for assistance at this time. She reports that she will reach out to me once she no longer has insurance so that we can apply for assistance through grant programs for uninsured.     Plan: The patient will follow up when she is eligible for medication assistance.   Thanks,  Reed Breech, [place] [phone]

## 2022-02-11 ENCOUNTER — Ambulatory Visit: Payer: Self-pay | Admitting: *Deleted

## 2022-02-11 ENCOUNTER — Encounter: Payer: Self-pay | Admitting: *Deleted

## 2022-02-11 NOTE — Patient Outreach (Signed)
  Care Coordination   Follow Up Visit Note   02/11/2022 Name: Taelyn Broecker MRN: [id number] DOB: 05-03-1960  Latiffany Harwick is a 62 y.o. year old female who sees Redmond School, MD for primary care. I spoke with  [name] by phone today.  What matters to the patients health and wellness today?  Resource assistance and recovering from knee surgery    Goals Addressed             This Visit's Progress    Care Coordination Services       Care Coordination Interventions: Evaluation of current treatment plan related to recent right total knee arthroplasty and patient's adherence to plan as established by provider Reviewed scheduled/upcoming provider appointments including Dr Lorin Mercy (ortho surgeon 03/06/22. Discussed plans with patient for ongoing care management follow up and provided patient with direct contact information for care management team Assessed social determinant of health barriers Discussed mobility and ability to perform ADLs . Currently using a cane for ambulation. Working with home health PT. Discussed resource needs. Verified patient received information on food resources and is utilizing a food bank. Verified receipt of information on utility resource assistance. Verified that she has talked with pharmacy team regarding medication cost.  Previously assessed family/social support Children help with food and transportation as much as possible. She has been out of work and her short-term disability will run out at the beginning of Feb. Her FMLA has ran out and she is taking a personal leave from work. Unsure of job security at this time due to points accumulated for personal leave.  Discussed that husband works and has insurance coverage on them as well, but that her insurance through her employer provides better coverage.  Provided with [facility] telephone number and encouraged to reach out as needed        SDOH assessments and interventions completed:   Yes  SDOH Interventions Today    Flowsheet Row Most Recent Value  SDOH Interventions   Food Insecurity Interventions Other (Comment)  [resources provided]  Financial Strain Interventions Other (Comment)  [pharmacy and utility resources provided]  Physical Activity Interventions Other (Comments)  [physical therapy s/p knee surgery]        Care Coordination Interventions:  Yes, provided  Interventions Today    Flowsheet Row Most Recent Value  Chronic Disease Discussed/Reviewed   Chronic disease discussed/reviewed during today's visit Other  [s/p R total knee arthroplasty]  General Interventions   General Interventions Discussed/Reviewed General Interventions Discussed, General Interventions Reviewed, [employer]  [provided information on utility assistance by care guides]  Pharmacy Interventions   Pharmacy Dicussed/Reviewed Affording Medications  [Patient has talked with pharmacy team about affordability once she loses her employer provided insurance]       Follow up plan: Follow up call scheduled for 03/12/22    Encounter Outcome:  Pt. Visit Completed   [name], BSN, RN-BC RN Care Coordinator [place]: [phone] Main #: [phone]

## 2022-02-13 DIAGNOSIS — M25561 Pain in right knee: Secondary | ICD-10-CM | POA: Diagnosis not present

## 2022-02-17 ENCOUNTER — Ambulatory Visit: Payer: BC Managed Care – PPO | Admitting: Nutrition

## 2022-02-17 DIAGNOSIS — M25561 Pain in right knee: Secondary | ICD-10-CM | POA: Diagnosis not present

## 2022-02-19 ENCOUNTER — Other Ambulatory Visit: Payer: Self-pay | Admitting: Orthopedic Surgery

## 2022-02-19 MED ORDER — OXYCODONE-ACETAMINOPHEN 5-325 MG PO TABS: 1.0000 | ORAL_TABLET | ORAL | 0 refills | Status: AC | PRN

## 2022-02-20 DIAGNOSIS — M25561 Pain in right knee: Secondary | ICD-10-CM | POA: Diagnosis not present

## 2022-02-21 ENCOUNTER — Other Ambulatory Visit: Payer: Self-pay | Admitting: Family Medicine

## 2022-02-24 DIAGNOSIS — M25561 Pain in right knee: Secondary | ICD-10-CM | POA: Diagnosis not present

## 2022-02-24 NOTE — Telephone Encounter (Signed)
Requested Prescriptions  Pending Prescriptions Disp Refills   VENTOLIN HFA 108 (90 Base) MCG/ACT inhaler [Pharmacy Med Name: VENTOLIN HFA 90 MCG INHALER] 18 each     Sig: INHALE 2 PUFFS INTO THE LUNGS EVERY 4 HOURS AS NEEDED FOR WHEEZING OR SHORTNESS OF BREATH.     There is no refill protocol information for this order

## 2022-02-26 ENCOUNTER — Telehealth: Payer: Self-pay | Admitting: Radiology

## 2022-02-26 NOTE — Telephone Encounter (Signed)
Patient called about husband's FMLA paperwork. He had to stay home with her last night due to her having trouble and he was told his FMLA did not cover this, only transportation. She is going to bring form to [place] office to complete adding patient will need help during flare. It is listed as such on already completed form, but they need it spelled out.  Plan to complete form in [place] office tomorrow and give back to patient.

## 2022-02-27 DIAGNOSIS — M25561 Pain in right knee: Secondary | ICD-10-CM | POA: Diagnosis not present

## 2022-03-03 DIAGNOSIS — M25561 Pain in right knee: Secondary | ICD-10-CM | POA: Diagnosis not present

## 2022-03-06 ENCOUNTER — Encounter: Payer: Self-pay | Admitting: Orthopaedic Surgery

## 2022-03-06 ENCOUNTER — Ambulatory Visit (INDEPENDENT_AMBULATORY_CARE_PROVIDER_SITE_OTHER): Payer: BC Managed Care – PPO | Admitting: Orthopaedic Surgery

## 2022-03-06 ENCOUNTER — Encounter: Payer: Self-pay | Admitting: *Deleted

## 2022-03-06 ENCOUNTER — Encounter: Payer: Self-pay | Admitting: Radiology

## 2022-03-06 VITALS — Ht 67.0 in | Wt 253.0 lb

## 2022-03-06 DIAGNOSIS — E6609 Other obesity due to excess calories: Secondary | ICD-10-CM | POA: Diagnosis not present

## 2022-03-06 DIAGNOSIS — E039 Hypothyroidism, unspecified: Secondary | ICD-10-CM | POA: Diagnosis not present

## 2022-03-06 DIAGNOSIS — M159 Polyosteoarthritis, unspecified: Secondary | ICD-10-CM | POA: Diagnosis not present

## 2022-03-06 DIAGNOSIS — Z96651 Presence of right artificial knee joint: Secondary | ICD-10-CM

## 2022-03-06 DIAGNOSIS — I10 Essential (primary) hypertension: Secondary | ICD-10-CM | POA: Diagnosis not present

## 2022-03-06 DIAGNOSIS — M25561 Pain in right knee: Secondary | ICD-10-CM | POA: Diagnosis not present

## 2022-03-06 DIAGNOSIS — Z6838 Body mass index (BMI) 38.0-38.9, adult: Secondary | ICD-10-CM | POA: Diagnosis not present

## 2022-03-06 DIAGNOSIS — F4322 Adjustment disorder with anxiety: Secondary | ICD-10-CM | POA: Diagnosis not present

## 2022-03-06 MED ORDER — TRAMADOL HCL 50 MG PO TABS: 50.0000 mg | ORAL_TABLET | Freq: Four times a day (QID) | ORAL | 1 refills | Status: DC | PRN
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
## 2022-03-10 ENCOUNTER — Telehealth: Payer: Self-pay | Admitting: Radiology

## 2022-03-10 ENCOUNTER — Other Ambulatory Visit: Payer: Self-pay | Admitting: Orthopaedic Surgery

## 2022-03-10 DIAGNOSIS — G4733 Obstructive sleep apnea (adult) (pediatric): Secondary | ICD-10-CM | POA: Diagnosis not present

## 2022-03-10 DIAGNOSIS — M25561 Pain in right knee: Secondary | ICD-10-CM | POA: Diagnosis not present

## 2022-03-10 MED ORDER — PREDNISONE 5 MG (21) PO TBPK: ORAL_TABLET | ORAL | 0 refills | Status: DC

## 2022-03-10 NOTE — Telephone Encounter (Signed)
Please see message from [place] office below and advise. Thanks.   [place], Carlon Karie Fetch, [place], RT She asked if Dr. Lorin Mercy can send her in a steroid pak for her hip pain?

## 2022-03-12 ENCOUNTER — Ambulatory Visit: Payer: Self-pay | Admitting: *Deleted

## 2022-03-13 DIAGNOSIS — M25561 Pain in right knee: Secondary | ICD-10-CM | POA: Diagnosis not present

## 2022-03-13 NOTE — Patient Outreach (Signed)
  Care Coordination   03/12/2022 Name: Becky [name] MRN: [id number] DOB: 21-Mar-1960   Care Coordination Outreach Attempts:  An unsuccessful telephone outreach was attempted for a scheduled appointment today.  Follow Up Plan:  No further outreach attempts will be made at this time. We have been unable to contact the patient to offer or enroll patient in care coordination services  Encounter Outcome:  No Answer. Left HIPAA compliant VM    Care Coordination Interventions:  No, not indicated    [name], BSN, RN-BC RN Care Coordinator [place]: [phone] Main #: [phone]

## 2022-03-17 DIAGNOSIS — M25561 Pain in right knee: Secondary | ICD-10-CM | POA: Diagnosis not present

## 2022-03-19 ENCOUNTER — Encounter: Payer: Self-pay | Admitting: Cardiology

## 2022-03-19 ENCOUNTER — Other Ambulatory Visit: Payer: Self-pay | Admitting: Cardiology

## 2022-03-19 ENCOUNTER — Ambulatory Visit: Payer: BC Managed Care – PPO | Attending: Cardiology | Admitting: Cardiology

## 2022-03-19 VITALS — BP 120/70 | HR 58 | Ht 67.0 in | Wt 251.4 lb

## 2022-03-19 DIAGNOSIS — I10 Essential (primary) hypertension: Secondary | ICD-10-CM | POA: Diagnosis not present

## 2022-03-19 DIAGNOSIS — I480 Paroxysmal atrial fibrillation: Secondary | ICD-10-CM

## 2022-03-19 DIAGNOSIS — R600 Localized edema: Secondary | ICD-10-CM | POA: Diagnosis not present
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
## 2022-03-19 NOTE — Telephone Encounter (Signed)
Xarelto '20mg'$  refill request received. Pt is 62 years old, weight-114kg, Crea-0.93 on 01/30/22, last seen by Dr. Harl Bowie on 03/19/22, Diagnosis-Afib, CrCl-115.77 mL/min; Dose is appropriate based on dosing criteria. Will send in refill to requested pharmacy.

## 2022-03-19 NOTE — Patient Instructions (Addendum)
Medication Instructions:  Your physician has recommended you make the following change in your medication:  Hold xarelto for 3 days, then resume at current dose if bleeding resolves. Please contact our office for ongoing bleeding Continue other medications the same  Labwork: none  Testing/Procedures: none  Follow-Up: Your physician recommends that you schedule a follow-up appointment in: 6 months  Any Other Special Instructions Will Be Listed Below (If Applicable).  If you need a refill on your cardiac medications before your next appointment, please call your pharmacy.

## 2022-03-20 ENCOUNTER — Telehealth: Payer: Self-pay | Admitting: *Deleted

## 2022-03-20 DIAGNOSIS — M25561 Pain in right knee: Secondary | ICD-10-CM | POA: Diagnosis not present

## 2022-03-20 NOTE — Progress Notes (Signed)
  Care Coordination Note  03/20/2022 Name: Becky [name] MRN: [id number] DOB: Jul 14, 1960  Becky [name] is a 62 y.o. year old female who is a primary care patient of [employer], MD and is actively engaged with the care management team. I reached out to [name] by phone today to assist with re-scheduling a follow up visit with the RN Case Manager  Follow up plan: Unsuccessful telephone outreach attempt made. A HIPAA compliant phone message was left for the patient providing contact information and requesting a return call.   [place]  Direct Dial: [phone]

## 2022-03-21 ENCOUNTER — Other Ambulatory Visit: Payer: Self-pay | Admitting: Cardiology

## 2022-03-21 NOTE — Progress Notes (Signed)
  Care Coordination Note  03/21/2022 Name: Becky [name] MRN: [id number] DOB: 11-25-1960  Becky [name] is a 62 y.o. year old female who is a primary care patient of [employer], MD and is actively engaged with the care management team. I reached out to [name] by phone today to assist with re-scheduling a follow up visit with the RN Case Manager  Follow up plan: Telephone appointment with care management team member scheduled for:04/02/22  [place]  Direct Dial: [phone]

## 2022-03-21 NOTE — Progress Notes (Signed)
  Care Coordination Note  03/21/2022 Name: Nou Lizalde MRN: [id number] DOB: 05/04/1960  Carmelle Mueth is a 62 y.o. year old female who is a primary care patient of [employer], MD and is actively engaged with the care management team. I reached out to [name] by phone today to assist with re-scheduling a follow up visit with the RN Case Manager  Follow up plan: Unsuccessful telephone outreach attempt made. A HIPAA compliant phone message was left for the patient providing contact information and requesting a return call.   [place]  Direct Dial: [phone]

## 2022-03-24 DIAGNOSIS — M25561 Pain in right knee: Secondary | ICD-10-CM | POA: Diagnosis not present

## 2022-03-26 ENCOUNTER — Encounter: Payer: BC Managed Care – PPO | Admitting: Gastroenterology

## 2022-03-27 DIAGNOSIS — M25561 Pain in right knee: Secondary | ICD-10-CM | POA: Diagnosis not present

## 2022-03-31 DIAGNOSIS — M25561 Pain in right knee: Secondary | ICD-10-CM | POA: Diagnosis not present

## 2022-04-02 ENCOUNTER — Ambulatory Visit: Payer: Self-pay | Admitting: *Deleted

## 2022-04-02 ENCOUNTER — Encounter: Payer: BC Managed Care – PPO | Admitting: Gastroenterology

## 2022-04-02 DIAGNOSIS — M25561 Pain in right knee: Secondary | ICD-10-CM | POA: Diagnosis not present

## 2022-04-02 NOTE — Patient Outreach (Signed)
  Care Coordination   04/02/2022 Name: Becky [name] MRN: [id number] DOB: 12-Nov-1960   Care Coordination Outreach Attempts:  An unsuccessful telephone outreach was attempted for a scheduled appointment today.  Follow Up Plan:  No further outreach attempts will be made at this time. We have been unable to contact the patient to offer or enroll patient in care coordination services  Encounter Outcome:  No Answer. Left HIPAA compliant VM.   Care Coordination Interventions:  No, not indicated    [name], BSN, RN-BC RN Care Coordinator [place] Direct Dial: [phone] Main #: [phone]
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
## 2022-04-03 ENCOUNTER — Encounter: Payer: Self-pay | Admitting: Orthopaedic Surgery

## 2022-04-03 ENCOUNTER — Ambulatory Visit (INDEPENDENT_AMBULATORY_CARE_PROVIDER_SITE_OTHER): Payer: BC Managed Care – PPO | Admitting: Orthopaedic Surgery

## 2022-04-03 ENCOUNTER — Encounter: Payer: BC Managed Care – PPO | Admitting: Gastroenterology

## 2022-04-03 ENCOUNTER — Encounter: Payer: Self-pay | Admitting: Gastroenterology

## 2022-04-03 VITALS — Ht 67.0 in | Wt 251.0 lb

## 2022-04-03 DIAGNOSIS — M7061 Trochanteric bursitis, right hip: Secondary | ICD-10-CM

## 2022-04-03 DIAGNOSIS — Z96651 Presence of right artificial knee joint: Secondary | ICD-10-CM

## 2022-04-03 MED ORDER — BUPIVACAINE HCL 0.25 % IJ SOLN
2.0000 mL | INTRAMUSCULAR | Status: AC | PRN
  Administered 2022-04-03: 2 mL via INTRA_ARTICULAR

## 2022-04-03 MED ORDER — METHYLPREDNISOLONE ACETATE 40 MG/ML IJ SUSP
40.0000 mg | INTRAMUSCULAR | Status: AC | PRN
  Administered 2022-04-03: 40 mg via INTRA_ARTICULAR

## 2022-04-03 MED ORDER — LIDOCAINE HCL 1 % IJ SOLN
1.0000 mL | INTRAMUSCULAR | Status: AC | PRN
  Administered 2022-04-03: 1 mL
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
## 2022-04-07 ENCOUNTER — Telehealth: Payer: Self-pay

## 2022-04-07 ENCOUNTER — Other Ambulatory Visit (HOSPITAL_COMMUNITY)
Admission: RE | Admit: 2022-04-07 | Discharge: 2022-04-07 | Disposition: A | Discharge status: Home / Self Care | Payer: BC Managed Care – PPO | Source: Ambulatory Visit | Attending: Adult Health | Admitting: Adult Health

## 2022-04-07 ENCOUNTER — Encounter: Payer: Self-pay | Admitting: Adult Health

## 2022-04-07 ENCOUNTER — Ambulatory Visit (INDEPENDENT_AMBULATORY_CARE_PROVIDER_SITE_OTHER): Payer: BC Managed Care – PPO | Admitting: Adult Health

## 2022-04-07 VITALS — BP 131/70 | HR 67 | Ht 67.0 in | Wt 256.0 lb

## 2022-04-07 DIAGNOSIS — Z01419 Encounter for gynecological examination (general) (routine) without abnormal findings: Secondary | ICD-10-CM | POA: Diagnosis not present

## 2022-04-07 DIAGNOSIS — Z1211 Encounter for screening for malignant neoplasm of colon: Secondary | ICD-10-CM | POA: Insufficient documentation

## 2022-04-07 DIAGNOSIS — R87612 Low grade squamous intraepithelial lesion on cytologic smear of cervix (LGSIL): Secondary | ICD-10-CM | POA: Insufficient documentation

## 2022-04-07 LAB — HEMOCCULT GUIAC POC 1CARD (OFFICE): Fecal Occult Blood, POC: NEGATIVE

## 2022-04-07 NOTE — Progress Notes (Signed)
Patient ID: Becky [name], female   DOB: Apr 13, 1960, 62 y.o.   MRN: [id number] History of Present Illness: Becky [name] is a 62 year old black female,married, PM in for a well woman gyn exam and pap. Her pap 02/26/21 was LSIL, +HPV 16 and HR other and colpo was performed 03/21/21 CIN 1. She had right knee replacement 01/29/22 and doing well, still going to PT and has cane. She still sees Dr Harl Bowie, too.  PCP is Dr Gerarda Fraction.   Current Medications, Allergies, Past Medical History, Past Surgical History, Family History and Social History were reviewed in [facility] record.     Review of Systems: Patient denies any headaches, hearing loss, fatigue, blurred vision, shortness of breath, chest pain, abdominal pain, problems with bowel movements, urination, or intercourse. No joint pain or mood swings.     Physical Exam:BP 131/70 (BP Location: Left Arm, Patient Position: Sitting, Cuff Size: Large)   Pulse 67   Ht 5\' 7"  (D34-534 m)   Wt 256 lb (116.1 kg)   BMI 40.10 kg/m   General:  Well developed, well nourished, no acute distress Skin:  Warm and dry Neck:  Midline trachea, normal thyroid, good ROM, no lymphadenopathy,no carotid bruits heard Lungs; Clear to auscultation bilaterally Breast:  No dominant palpable mass, retraction, or nipple discharge Cardiovascular: Regular rate and rhythm Abdomen:  Soft, non tender, no hepatosplenomegaly Pelvic:  External genitalia is normal in appearance, no lesions.  The vagina is normal in appearance. Urethra has no lesions or masses. The cervix is smooth, pap with HR HPV genotyping performed.  Uterus is felt to be normal size, shape, and contour.  No adnexal masses or tenderness noted.Bladder is non tender, no masses felt. Rectal: Good sphincter tone, no polyps, or hemorrhoids felt.  Hemoccult negative. Extremities/musculoskeletal:  No varicosities noted, no clubbing or cyanosis, right knee still swollen some Psych:  No mood changes, alert and  cooperative,seems happy AA is 0 Fall risk is low    04/07/2022   11:58 AM 11/21/2021    8:47 AM 07/30/2021    5:07 PM  Depression screen PHQ 2/9  Decreased Interest 0 0 0  Down, Depressed, Hopeless 0 0 0  PHQ - 2 Score 0 0 0  Altered sleeping 2    Tired, decreased energy 0    Change in appetite 0    Feeling bad or failure about yourself  0    Trouble concentrating 0    Moving slowly or fidgety/restless 0    Suicidal thoughts 0    PHQ-9 Score 2         04/07/2022   11:58 AM 02/26/2021   11:58 AM  GAD 7 : Generalized Anxiety Score  Nervous, Anxious, on Edge 0 0  Control/stop worrying 0 0  Worry too much - different things 0 0  Trouble relaxing 0 0  Restless 0 0  Easily annoyed or irritable 0 0  Afraid - awful might happen 0 0  Total GAD 7 Score 0 0      Upstream - 04/07/22 1152       Pregnancy Intention Screening   Does the patient want to become pregnant in the next year? N/A    Does the patient's partner want to become pregnant in the next year? N/A    Would the patient like to discuss contraceptive options today? N/A      Contraception Wrap Up   Current Method Female Sterilization    End Method Female Sterilization  Contraception Counseling Provided No             Examination chaperoned by Levy Pupa LPN  Impression and Plan: 1. Encounter for gynecological examination with Papanicolaou smear of cervix Pap sent Physical in 1 year Mammogram was negative 02/12/21 Colonoscopy per GI Labs with PCP - Cytology - PAP( [facility])  2. LGSIL on Pap smear of cervix Pap sent  3. Encounter for screening fecal occult blood testing Hemoccult was negative  - POCT occult blood stool

## 2022-04-07 NOTE — Progress Notes (Signed)
[facility])                                            [place] Team    04/07/2022  Becky [name] 03/11/1960 [id number]  [facility] due to the following reasons: the patient still has active insurance and is not needing financial assistance with medications at this time.   -I have provided my contact information if patient or family needs to reach out to me in the future.   Reason for referral: Medication Assistance due to loss of insurance   [facility] is happy to assist the patient/family in the future for clinical pharmacy needs, following a discussion from your team about [facility] outreach. Thank you for allowing [facility] to be a part of your patient's care.   Reed Breech, PharmD Clinical Pharmacist  [place] [phone]

## 2022-04-10 DIAGNOSIS — G4733 Obstructive sleep apnea (adult) (pediatric): Secondary | ICD-10-CM | POA: Diagnosis not present

## 2022-04-10 LAB — CYTOLOGY - PAP
Chlamydia: NEGATIVE
Comment: NEGATIVE
Comment: NEGATIVE
Comment: NORMAL
Diagnosis: UNDETERMINED — AB
High risk HPV: NEGATIVE
Neisseria Gonorrhea: NEGATIVE

## 2022-04-11 DIAGNOSIS — M25561 Pain in right knee: Secondary | ICD-10-CM | POA: Diagnosis not present

## 2022-04-14 ENCOUNTER — Ambulatory Visit: Payer: Self-pay | Admitting: *Deleted

## 2022-04-14 ENCOUNTER — Encounter: Payer: Self-pay | Admitting: Adult Health

## 2022-04-14 DIAGNOSIS — R87610 Atypical squamous cells of undetermined significance on cytologic smear of cervix (ASC-US): Secondary | ICD-10-CM | POA: Insufficient documentation

## 2022-04-14 NOTE — Patient Outreach (Signed)
  Care Coordination   04/14/2022 Name: Becky [name] MRN: [id number] DOB: 06/04/1960   Care Coordination Outreach Attempts:  An unsuccessful telephone outreach was attempted for a scheduled appointment today. Unsuccessful #3.  Follow Up Plan:  No further outreach attempts will be made at this time. We have been unable to contact the patient to offer or enroll patient in care coordination services  Encounter Outcome:  No Answer. Left HIPAA compliant VM.   Care Coordination Interventions:  No, not indicated    Demetrios Loll, BSN, RN-BC RN Care Coordinator [facility]  [facility] Direct Dial: [phone] Main #: [phone]

## 2022-04-18 ENCOUNTER — Encounter: Payer: Self-pay | Admitting: *Deleted

## 2022-04-18 NOTE — Patient Outreach (Signed)
  Care Coordination   Follow Up Visit Note   04/14/2022 Name: Becky [name] MRN: [id number] DOB: 27-Apr-1960  Becky [name] is a 62 y.o. year old female who sees Elfredia Nevins, MD for primary care. I spoke with  Becky [name] by phone today.  What matters to the patients health and wellness today?  Managing joint pain and obtaining insurance coverage if employer provided coverage is terminated    Goals Addressed             This Visit's Progress    Care Coordination Services       Care Coordination Goals: Patient will follow-up with PCP and/or specialist(s) as recommended Patient will take medications as prescribed Patient will reach out to RN Care Coordinator [phone] with any care coordination or resource needs Patient will utilize the [facility] 24 Hour Nurse/Concierge Line as needed [phone] Patient will talk with [facility] representative about insurance coverage if her employer coverage terminates       Manage Hypertension       Care Coordination Goals: Patient will take medications as directed and report any negative side effects to provider  Patient will use a pill box/organizer to help keep up with when to take medications Patient will monitor and record blood pressure 3 times per week and as needed and will call PCP or specialist with any readings outside of recommended range Patient will keep all recommended follow-up appointments with PCP and specialists (cardiology, nephrology, etc) Patient will take blood pressure log to PCP and specialty appointments for review Patient will follow a low sodium/DASH diet  Patient will reach out to RN Care Coordinator [phone] with any care coordination or resource needs       Manage Musckuloskeletal Pain       Care Coordination Goals: Patient will keep all medical appointments Patient will reach out to provider with any new or worsening symptoms Patient will take medications as prescribed Patient will increase  activity level as tolerated and rest as needed Patient will reach out to RN Care Coordinator [phone] with any care coordination or resource needs        SDOH assessments and interventions completed:  No     Care Coordination Interventions:  Yes, provided  Interventions Today    Flowsheet Row Most Recent Value  Chronic Disease   Chronic disease during today's visit Hypertension (HTN), Other  [Trochanteric bursitis, hx of knee arthroplasty]  General Interventions   General Interventions Discussed/Reviewed General Interventions Discussed, General Interventions Reviewed, Doctor Visits, Durable Medical Equipment (DME)  Doctor Visits Discussed/Reviewed Doctor Visits Discussed, Doctor Visits Reviewed, Specialist, PCP  Durable Medical Equipment (DME) BP Cuff  PCP/Specialist Visits Compliance with follow-up visit  Exercise Interventions   Exercise Discussed/Reviewed Physical Activity  Education Interventions   Education Provided Provided Education  Provided Verbal Education On [facility], When to see the doctor, Mental Health/Coping with Illness, Medication  [SHIIP]  Pharmacy Interventions   Pharmacy Dicussed/Reviewed Medications and their functions  Safety Interventions   Safety Discussed/Reviewed Safety Discussed, Fall Risk       Follow up plan: Follow up call scheduled for 05/20/22    Encounter Outcome:  Pt. Visit Completed   Demetrios Loll, BSN, RN-BC RN Care Coordinator [facility]  [facility] Direct Dial: [phone] Main #: [phone]

## 2022-04-24 DIAGNOSIS — M25561 Pain in right knee: Secondary | ICD-10-CM | POA: Diagnosis not present

## 2022-05-08 ENCOUNTER — Ambulatory Visit (INDEPENDENT_AMBULATORY_CARE_PROVIDER_SITE_OTHER): Payer: BC Managed Care – PPO | Admitting: Orthopaedic Surgery

## 2022-05-08 ENCOUNTER — Encounter: Payer: Self-pay | Admitting: Orthopaedic Surgery

## 2022-05-08 VITALS — Ht 67.0 in | Wt 256.0 lb

## 2022-05-08 DIAGNOSIS — Z96651 Presence of right artificial knee joint: Secondary | ICD-10-CM | POA: Diagnosis not present

## 2022-05-08 DIAGNOSIS — M542 Cervicalgia: Secondary | ICD-10-CM | POA: Diagnosis not present
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
## 2022-05-10 DIAGNOSIS — G4733 Obstructive sleep apnea (adult) (pediatric): Secondary | ICD-10-CM | POA: Diagnosis not present

## 2022-05-20 ENCOUNTER — Ambulatory Visit: Payer: Self-pay | Admitting: *Deleted

## 2022-05-20 ENCOUNTER — Encounter: Payer: Self-pay | Admitting: *Deleted

## 2022-05-20 NOTE — Patient Outreach (Signed)
Care Coordination   Follow Up Visit Note   05/20/2022 Name: Becky [name] MRN: [id number] DOB: 1960-12-27  Becky [name] is a 62 y.o. year old female who sees Elfredia Nevins, MD for primary care. I spoke with  [name] by phone today.  What matters to the patients health and wellness today?  Managing arthritis pain, increasing mobility, obtaining better insurance coverage    Goals Addressed             This Visit's Progress    Care Coordination Services   On track    Care Coordination Goals: Patient will follow-up with PCP and/or specialist(s) as recommended Patient will take medications as prescribed Patient will reach out to RN Care Coordinator [phone] with any care coordination or resource needs Patient will utilize the [facility] 24 Hour Nurse/Concierge Line as needed [phone] Patient will talk with Danford Bad, LCSW [phone]) regarding insurance and disability       Manage Hypertension   On track    Care Coordination Goals: Patient will take medications as directed and report any negative side effects to provider  Patient will use a pill box/organizer to help keep up with when to take medications Patient will monitor and record blood pressure 3 times per week and as needed and will call PCP or specialist with any readings outside of recommended range Patient will keep all recommended follow-up appointments with PCP and specialists (cardiology, nephrology, etc) Patient will take blood pressure log to PCP and specialty appointments for review Patient will follow a low sodium/DASH diet  Patient will reach out to RN Care Coordinator [phone] with any care coordination or resource needs       Manage Musckuloskeletal Pain   On track    Care Coordination Goals: Patient will keep all medical appointments Patient will reach out to provider with any new or worsening symptoms Patient will continue outpatient physical therapy Patient will take  medications as prescribed Patient will increase activity level as tolerated and rest as needed Patient will reach out to RN Care Coordinator [phone] with any care coordination or resource needs        SDOH assessments and interventions completed:  Yes  SDOH Interventions Today    Flowsheet Row Most Recent Value  SDOH Interventions   Transportation Interventions Intervention Not Indicated  Financial Strain Interventions Other (Comment)  [referral to [facility] LCSW]        Care Coordination Interventions:  Yes, provided  Interventions Today    Flowsheet Row Most Recent Value  Chronic Disease   Chronic disease during today's visit Other, Hypertension (HTN)  [osteoarthritis]  General Interventions   General Interventions Discussed/Reviewed General Interventions Discussed, General Interventions Reviewed, Durable Medical Equipment (DME), Doctor Visits  Doctor Visits Discussed/Reviewed Doctor Visits Discussed, Doctor Visits Reviewed, Specialist, PCP  Durable Medical Equipment (DME) Walker, BP Cuff  PCP/Specialist Visits Compliance with follow-up visit  Exercise Interventions   Exercise Discussed/Reviewed Physical Activity, Exercise Reviewed, Exercise Discussed  Physical Activity Discussed/Reviewed Physical Activity Discussed, Physical Activity Reviewed, Types of exercise  [physical therapy twice a week]  Education Interventions   Education Provided Provided Education  Provided Verbal Education On When to see the doctor, Exercise, Medication, [employer], Insurance Plans  [blood pressure monitoring]  Mental Health Interventions   Mental Health Discussed/Reviewed Refer to Social Work for resources  Refer to Social Work for resources regarding Other  [employer] coverage. Approved for SS disability. Told that she couldn't apply for Medicare for 2 years. Doesn't feel that she'll qualify  for Medicaid Currently covered by husband's insurance.]  Safety Interventions    Safety Discussed/Reviewed Safety Discussed, Safety Reviewed, Fall Risk, Home Safety  Home Safety Assistive Devices       Follow up plan: Follow up call scheduled for 06/20/22    Encounter Outcome:  Pt. Visit Completed   Demetrios Loll, BSN, RN-BC RN Care Coordinator [facility]  [facility] Direct Dial: [phone] Main #: [phone]

## 2022-05-21 ENCOUNTER — Other Ambulatory Visit: Payer: Self-pay | Admitting: Family Medicine

## 2022-05-21 ENCOUNTER — Other Ambulatory Visit: Payer: Self-pay | Admitting: Adult Health

## 2022-05-21 ENCOUNTER — Other Ambulatory Visit: Payer: Self-pay | Admitting: Orthopaedic Surgery

## 2022-05-21 DIAGNOSIS — M25561 Pain in right knee: Secondary | ICD-10-CM | POA: Diagnosis not present

## 2022-05-22 NOTE — Telephone Encounter (Signed)
Requested Prescriptions  Refused Prescriptions Disp Refills   VENTOLIN HFA 108 (90 Base) MCG/ACT inhaler [Pharmacy Med Name: VENTOLIN HFA 90 MCG INHALER] 18 each     Sig: INHALE 2 PUFFS INTO THE LUNGS EVERY 4 HOURS AS NEEDED FOR WHEEZING OR SHORTNESS OF BREATH.     There is no refill protocol information for this order

## 2022-05-23 DIAGNOSIS — M542 Cervicalgia: Secondary | ICD-10-CM | POA: Diagnosis not present

## 2022-05-27 DIAGNOSIS — M25561 Pain in right knee: Secondary | ICD-10-CM | POA: Diagnosis not present

## 2022-05-28 ENCOUNTER — Encounter: Payer: Self-pay | Admitting: *Deleted

## 2022-05-28 ENCOUNTER — Ambulatory Visit: Payer: Self-pay | Admitting: *Deleted
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
## 2022-05-28 NOTE — Patient Instructions (Signed)
Visit Information  Thank you for taking time to visit with me today. Please don't hesitate to contact me if I can be of assistance to you.   Following are the goals we discussed today:   Goals Addressed               This Visit's Progress     Receive Assistance Applying for Medicaid. (pt-stated)   On track     Care Coordination Interventions:  Interventions Today    Flowsheet Row Most Recent Value  Chronic Disease   Chronic disease during today's visit Diabetes, Hypertension (HTN), Atrial Fibrillation (AFib), Other  General Interventions   General Interventions Discussed/Reviewed General Interventions Discussed, Labs, General Interventions Reviewed, Annual Eye Exam, Durable Medical Equipment (DME), Annual Foot Exam, Community Resources, Level of Care, Communication with, Doctor Visits, Vaccines, Health Screening  [Encouraged]  Labs Hgb A1c every 3 months  [Encouraged]  Vaccines COVID-19, Flu, Pneumonia, RSV, Shingles, Tetanus/Pertussis/Diphtheria  [Encouraged]  Doctor Visits Discussed/Reviewed Doctor Visits Discussed, Specialist, Doctor Visits Reviewed, Annual Wellness Visits, PCP  [Encouraged]  Health Screening Bone Density, Colonoscopy, Mammogram  [Encouraged]  Durable Medical Equipment (DME) Walker, BP Cuff, Glucomoter  [Encouraged]  PCP/Specialist Visits Compliance with follow-up visit  [Encouraged]  Communication with PCP/Specialists, RN  [Encouraged]  Level of Care Applications  [Encouraged]  Applications Medicaid, Personal Care Services  [Encouraged]  Exercise Interventions   Exercise Discussed/Reviewed Exercise Discussed, Assistive device use and maintanence, Exercise Reviewed, Physical Activity, Weight Managment  [Encouraged]  Physical Activity Discussed/Reviewed Physical Activity Discussed, Home Exercise Program (HEP), Physical Activity Reviewed, Types of exercise  [Encouraged]  Weight Management Weight loss  [Encouraged]  Education Interventions   Education Provided  Provided Therapist, sports, Provided Web-based Education, Provided Education  [Encouraged]  Provided Engineer, petroleum On Development worker, community, [employer], When to see the doctor, Mental Health/Coping with Illness, Nutrition, Foot Care, Eye Care, Blood Sugar Monitoring, Applications, Exercise, Medication  [Encouraged]  Applications Medicaid, Personal Care Services  [Encouraged]  Mental Health Interventions   Mental Health Discussed/Reviewed Other, Crisis, Suicide, Coping Strategies, Substance Abuse, Grief and Loss, Mental Health Reviewed, Depression, Anxiety, Mental Health Discussed  [Domestic Violence]  Nutrition Interventions   Nutrition Discussed/Reviewed Nutrition Discussed, Nutrition Reviewed, Adding fruits and vegetables, Increaing proteins, Decreasing fats, Decreasing salt, Decreasing sugar intake, Portion sizes, Carbohydrate meal planning, Fluid intake  [Encouraged]  Pharmacy Interventions   Pharmacy Dicussed/Reviewed Pharmacy Topics Discussed, Pharmacy Topics Reviewed, Medication Adherence, Affording Medications  [Encouraged]  Safety Interventions   Safety Discussed/Reviewed Safety Discussed, Safety Reviewed, Fall Risk, Home Safety  [Encouraged]  Home Safety Assistive Devices, Need for home safety assessment, Refer for community resources  [Encouraged]  Advanced Directive Interventions   Advanced Directives Discussed/Reviewed Advanced Directives Discussed  [Encouraged]     Assessed Social Determinant of Health Barriers. Discussed Plans for Ongoing Care Management Follow Up. Provided Careers information officer Information for Care Management Team Members. Screened for Signs & Symptoms of Depression, Related to Chronic Disease State.  PHQ2 & PHQ9 Depression Screen Completed & Results Reviewed.  Suicidal Ideation & Homicidal Ideation Assessed - None Present.   Domestic Violence Assessed - None Present. Access to Weapons Assessed - None Present.   Active Listening & Reflection Utilized.   Verbalization of Feelings Encouraged.  Emotional Support Provided. Feelings of Stress Regarding Financial Insecurity Validated. Symptoms of Anxiety Acknowledged. Financial Resources Reviewed. Financial Resources Provided.   Insurance underwriter of Interest Emphasized. Crisis Support Information, Agencies, Services & Resources Discussed. Problem Solving Interventions Identified. Task-Centered Solutions Implemented.  Solution-Focused Strategies Developed. Acceptance & Commitment Therapy Introduced. Brief Cognitive Behavioral Therapy Initiated. Client-Centered Therapy Enacted. Reviewed Prescription Medications & Discussed Importance of Compliance. Quality of Sleep Assessed & Sleep Hygiene Techniques Promoted. Congratulations on Approval for [employer] Disability, through [employer], Effective 05/21/2022. Reviewed Eligibility Requirements & Confirmed Inability to Apply for [employer] Income, through [employer], Until [place] Have Received Social Security Disability for A Minimum of 2 Years. Discussed Importance of Applying for Medicaid, through [facility], at [employer], Due to Termination of [employer] (Blue Cross/Blue [employer]) through Associate Professor, Effective 06/06/2022. Encouraged Review of The Following List of Instructions, Tips & Applications, Emailed on 05/28/2022:   ~ Instructions on How to Apply for Supplemental Security Income   ~ Instructions on How to Apply for Medicaid Online   ~ 2023 Medicaid Tips   ~ Medicaid Application    ~ Making Medicaid Accessible Through [facility]   ~ [facility] Application Encouraged Direct Contact with [name] (# [phone]), If You Have Questions, Need Assistance, or If Additional Social Work Needs Are Identified Between Now & Our Next Scheduled Follow-Up Outreach Call.      Our next appointment is by  telephone on 06/05/2022 at 2:00 pm.  Please call the care guide team at [phone] if you need to cancel or reschedule your appointment.   If you are experiencing a Mental Health or Behavioral Health Crisis or need someone to talk to, please call the Suicide and Crisis Lifeline: 988 call the [place] National Suicide Prevention Lifeline: 905 671 8328 or TTY: 623-615-0127 TTY 828-712-8345) to talk to a trained counselor call 1-800-273-TALK (toll free, 24 hour hotline) go to [facility] [address], [place] [phone]) call the [facility] Crisis Line: [phone] call 911  Patient verbalizes understanding of instructions and care plan provided today and agrees to view in MyChart. Active MyChart status and patient understanding of how to access instructions and care plan via MyChart confirmed with patient.     Telephone follow up appointment with care management team member scheduled for:  06/05/2022 at 2:00 pm.  Danford Bad, BSW, MSW, LCSW  Licensed Clinical Social Worker  [facility]  Mailing [place]. [address] [place], [place] [address] Physical Address-[address], [place], [place] [address] Toll Free Main # [phone] Fax # [phone] Cell # [phone] [email or web address]

## 2022-05-29 DIAGNOSIS — M25561 Pain in right knee: Secondary | ICD-10-CM | POA: Diagnosis not present

## 2022-06-05 ENCOUNTER — Ambulatory Visit (INDEPENDENT_AMBULATORY_CARE_PROVIDER_SITE_OTHER): Payer: BC Managed Care – PPO | Admitting: Orthopaedic Surgery

## 2022-06-05 ENCOUNTER — Ambulatory Visit: Payer: Self-pay | Admitting: *Deleted

## 2022-06-05 ENCOUNTER — Encounter: Payer: Self-pay | Admitting: Orthopaedic Surgery

## 2022-06-05 ENCOUNTER — Encounter: Payer: Self-pay | Admitting: *Deleted

## 2022-06-05 VITALS — Ht 67.0 in | Wt 258.0 lb

## 2022-06-05 DIAGNOSIS — M6281 Muscle weakness (generalized): Secondary | ICD-10-CM | POA: Diagnosis not present

## 2022-06-05 DIAGNOSIS — Z96651 Presence of right artificial knee joint: Secondary | ICD-10-CM

## 2022-06-05 NOTE — Patient Instructions (Signed)
Visit Information  Thank you for taking time to visit with me today. Please don't hesitate to contact me if I can be of assistance to you.   Following are the goals we discussed today:   Goals Addressed               This Visit's Progress     Receive Assistance Applying for Medicaid. (pt-stated)   On track     Care Coordination Interventions:  Interventions Today    Flowsheet Row Most Recent Value  Chronic Disease   Chronic disease during today's visit Diabetes, Hypertension (HTN), Atrial Fibrillation (AFib), Other  General Interventions   General Interventions Discussed/Reviewed General Interventions Discussed, Labs, General Interventions Reviewed, Annual Eye Exam, Durable Medical Equipment (DME), Annual Foot Exam, Community Resources, Level of Care, Communication with, Doctor Visits, Vaccines, Health Screening  [Encouraged]  Labs Hgb A1c every 3 months  [Encouraged]  Vaccines COVID-19, Flu, Pneumonia, RSV, Shingles, Tetanus/Pertussis/Diphtheria  [Encouraged]  Doctor Visits Discussed/Reviewed Doctor Visits Discussed, Specialist, Doctor Visits Reviewed, Annual Wellness Visits, PCP  [Encouraged]  Health Screening Bone Density, Colonoscopy, Mammogram  [Encouraged]  Durable Medical Equipment (DME) Walker, BP Cuff, Glucomoter  [Encouraged]  PCP/Specialist Visits Compliance with follow-up visit  [Encouraged]  Communication with PCP/Specialists, RN  [Encouraged]  Level of Care Applications  [Encouraged]  Applications Medicaid, Personal Care Services  [Encouraged]  Exercise Interventions   Exercise Discussed/Reviewed Exercise Discussed, Assistive device use and maintanence, Exercise Reviewed, Physical Activity, Weight Managment  [Encouraged]  Physical Activity Discussed/Reviewed Physical Activity Discussed, Home Exercise Program (HEP), Physical Activity Reviewed, Types of exercise  [Encouraged]  Weight Management Weight loss  [Encouraged]  Education Interventions   Education Provided  Provided Therapist, sports, Provided Web-based Education, Provided Education  [Encouraged]  Provided Engineer, petroleum On Development worker, community, [employer], When to see the doctor, Mental Health/Coping with Illness, Nutrition, Foot Care, Eye Care, Blood Sugar Monitoring, Applications, Exercise, Medication  [Encouraged]  Applications Medicaid, Personal Care Services  [Encouraged]  Mental Health Interventions   Mental Health Discussed/Reviewed Other, Crisis, Suicide, Coping Strategies, Substance Abuse, Grief and Loss, Mental Health Reviewed, Depression, Anxiety, Mental Health Discussed  [Domestic Violence]  Nutrition Interventions   Nutrition Discussed/Reviewed Nutrition Discussed, Nutrition Reviewed, Adding fruits and vegetables, Increaing proteins, Decreasing fats, Decreasing salt, Decreasing sugar intake, Portion sizes, Carbohydrate meal planning, Fluid intake  [Encouraged]  Pharmacy Interventions   Pharmacy Dicussed/Reviewed Pharmacy Topics Discussed, Pharmacy Topics Reviewed, Medication Adherence, Affording Medications  [Encouraged]  Safety Interventions   Safety Discussed/Reviewed Safety Discussed, Safety Reviewed, Fall Risk, Home Safety  [Encouraged]  Home Safety Assistive Devices, Need for home safety assessment, Refer for community resources  [Encouraged]  Advanced Directive Interventions   Advanced Directives Discussed/Reviewed Advanced Directives Discussed  [Encouraged]       Active Listening & Reflection Utilized.  Verbalization of Feelings Encouraged.  Emotional Support Provided. Feelings of Stress Regarding Financial Insecurity Validated. Symptoms of Anxiety Acknowledged. Crisis Support Information, Agencies, Services & Resources Revisited. Problem Solving Interventions Activated. Task-Centered Solutions Implemented.   Solution-Focused Strategies Employed. Acceptance & Commitment Therapy Performed. Cognitive Behavioral Therapy Initiated. Client-Centered Therapy  Indicated. Confirmed Receipt & Thoroughly Reviewed Lobbyist, Services & Resources Provided, to Ensure Understanding & Entertain Questions.   Encouraged Animal nutritionist, Services & Resources of Interest, in An Effort to [employer]. Confirmed Receipt & Thoroughly Reviewed Medicaid Application Provided, to Ensure Understanding, Entertain Questions & Assist with Completion. Encouraged Submission of Completed & Signed Medicaid Application to [facility] Department of Social Services (#  [phone]), for Processing. Encouraged Re-Enrollment of Exercise Program, through [employer], at [employer]. Encouraged Careers information officer with [name] (# [phone]), If You Have Questions, Need Assistance, or If Additional Social Work Needs Are Identified Between Now & Our Next Scheduled Follow-Up Outreach Call.      Our next appointment is by telephone on 06/19/2022 at 9:30 am.  Please call the care guide team at [phone] if you need to cancel or reschedule your appointment.   If you are experiencing a Mental Health or Behavioral Health Crisis or need someone to talk to, please call the Suicide and Crisis Lifeline: 988 call the [place] National Suicide Prevention Lifeline: 226-790-6725 or TTY: 843-400-1560 TTY 813-534-1021) to talk to a trained counselor call 1-800-273-TALK (toll free, 24 hour hotline) go to [facility] [address] [place] [phone]) call the [facility] Crisis Line: [phone] call 911  Patient verbalizes understanding of instructions and care plan provided today and agrees to view in MyChart. Active MyChart status and patient understanding of how to access instructions and care plan via MyChart confirmed with patient.     Telephone follow up appointment with care management team member scheduled for:  06/19/2022 at 9:30 am.  Danford Bad, BSW, MSW, LCSW  Licensed  Clinical Social Worker  [facility]  Mailing [place]. [address] [place], [place] [address] Physical Address-[address] [place], [place] [address] Toll Free Main # [phone] Fax # [phone] Cell # [phone] [email or web address]
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
## 2022-06-05 NOTE — Patient Outreach (Signed)
Care Coordination   Follow Up Visit Note   06/05/2022  Name: Becky [name] MRN: [id number] DOB: [birth year]  Becky [name] is a 62 y.o. year old female who sees Elfredia Nevins, MD for primary care. I spoke with [name] by phone today.  What matters to the patients health and wellness today?  Receive Assistance Applying for Medicaid.   Goals Addressed               This Visit's Progress     Receive Assistance Applying for Medicaid. (pt-stated)   On track     Care Coordination Interventions:  Interventions Today    Flowsheet Row Most Recent Value  Chronic Disease   Chronic disease during today's visit Diabetes, Hypertension (HTN), Atrial Fibrillation (AFib), Other  General Interventions   General Interventions Discussed/Reviewed General Interventions Discussed, Labs, General Interventions Reviewed, Annual Eye Exam, Durable Medical Equipment (DME), Annual Foot Exam, Community Resources, Level of Care, Communication with, Doctor Visits, Vaccines, Health Screening  [Encouraged]  Labs Hgb A1c every 3 months  [Encouraged]  Vaccines COVID-19, Flu, Pneumonia, RSV, Shingles, Tetanus/Pertussis/Diphtheria  [Encouraged]  Doctor Visits Discussed/Reviewed Doctor Visits Discussed, Specialist, Doctor Visits Reviewed, Annual Wellness Visits, PCP  [Encouraged]  Health Screening Bone Density, Colonoscopy, Mammogram  [Encouraged]  Durable Medical Equipment (DME) Walker, BP Cuff, Glucomoter  [Encouraged]  PCP/Specialist Visits Compliance with follow-up visit  [Encouraged]  Communication with PCP/Specialists, RN  [Encouraged]  Level of Care Applications  [Encouraged]  Applications Medicaid, Personal Care Services  [Encouraged]  Exercise Interventions   Exercise Discussed/Reviewed Exercise Discussed, Assistive device use and maintanence, Exercise Reviewed, Physical Activity, Weight Managment  [Encouraged]  Physical Activity Discussed/Reviewed Physical Activity Discussed, Home  Exercise Program (HEP), Physical Activity Reviewed, Types of exercise  [Encouraged]  Weight Management Weight loss  [Encouraged]  Education Interventions   Education Provided Provided Therapist, sports, Provided Web-based Education, Provided Education  [Encouraged]  Provided Engineer, petroleum On Development worker, community, [employer], When to see the doctor, Mental Health/Coping with Illness, Nutrition, Foot Care, Eye Care, Blood Sugar Monitoring, Applications, Exercise, Medication  [Encouraged]  Applications Medicaid, Personal Care Services  [Encouraged]  Mental Health Interventions   Mental Health Discussed/Reviewed Other, Crisis, Suicide, Coping Strategies, Substance Abuse, Grief and Loss, Mental Health Reviewed, Depression, Anxiety, Mental Health Discussed  [Domestic Violence]  Nutrition Interventions   Nutrition Discussed/Reviewed Nutrition Discussed, Nutrition Reviewed, Adding fruits and vegetables, Increaing proteins, Decreasing fats, Decreasing salt, Decreasing sugar intake, Portion sizes, Carbohydrate meal planning, Fluid intake  [Encouraged]  Pharmacy Interventions   Pharmacy Dicussed/Reviewed Pharmacy Topics Discussed, Pharmacy Topics Reviewed, Medication Adherence, Affording Medications  [Encouraged]  Safety Interventions   Safety Discussed/Reviewed Safety Discussed, Safety Reviewed, Fall Risk, Home Safety  [Encouraged]  Home Safety Assistive Devices, Need for home safety assessment, Refer for community resources  [Encouraged]  Advanced Directive Interventions   Advanced Directives Discussed/Reviewed Advanced Directives Discussed  [Encouraged]       Active Listening & Reflection Utilized.  Verbalization of Feelings Encouraged.  Emotional Support Provided. Feelings of Stress Regarding Financial Insecurity Validated. Symptoms of Anxiety Acknowledged. Crisis Support Information, Agencies, Services & Resources Revisited. Problem Solving Interventions Activated. Task-Centered Solutions  Implemented.   Solution-Focused Strategies Employed. Acceptance & Commitment Therapy Performed. Cognitive Behavioral Therapy Initiated. [name]. Confirmed Receipt & Thoroughly Reviewed Lobbyist, Services & Resources Provided, to Ensure Understanding & Entertain Questions.   Encouraged Animal nutritionist, Services & Resources of Interest, in An Effort to [employer]. Confirmed Receipt & Thoroughly Reviewed Medicaid Application Provided,  to Ensure Understanding, Entertain Questions & Assist with Completion. Encouraged Submission of Completed & Signed Medicaid Application to [facility] of Social Services [phone]), for Processing. Encouraged Re-Enrollment of Exercise Program, through [employer], at [employer]. Encouraged Careers information officer with [name] (# [phone]), If You Have Questions, Need Assistance, or If Additional Social Work Needs Are Identified Between Now & Our Next Scheduled Follow-Up Outreach Call.      SDOH assessments and interventions completed:  Yes.  Care Coordination Interventions:  Yes, provided.   Follow up plan: Follow up call scheduled for 06/19/2022 at 9:30 am.   Encounter Outcome:  Pt. Visit Completed.   Danford Bad, BSW, MSW, LCSW  Licensed Restaurant manager, fast food [facility]  Mailing [place] [address], [place], [place] [address] Physical Address[address] [place], [place] [address] Toll Free Main # [phone] Fax # [phone] Cell # [phone] [email or web address]

## 2022-06-06 DIAGNOSIS — Z6838 Body mass index (BMI) 38.0-38.9, adult: Secondary | ICD-10-CM | POA: Diagnosis not present

## 2022-06-06 DIAGNOSIS — I4891 Unspecified atrial fibrillation: Secondary | ICD-10-CM | POA: Diagnosis not present

## 2022-06-06 DIAGNOSIS — M159 Polyosteoarthritis, unspecified: Secondary | ICD-10-CM | POA: Diagnosis not present

## 2022-06-06 DIAGNOSIS — M12811 Other specific arthropathies, not elsewhere classified, right shoulder: Secondary | ICD-10-CM | POA: Diagnosis not present

## 2022-06-06 DIAGNOSIS — E6609 Other obesity due to excess calories: Secondary | ICD-10-CM | POA: Diagnosis not present

## 2022-06-06 DIAGNOSIS — J0100 Acute maxillary sinusitis, unspecified: Secondary | ICD-10-CM | POA: Diagnosis not present

## 2022-06-06 DIAGNOSIS — I10 Essential (primary) hypertension: Secondary | ICD-10-CM | POA: Diagnosis not present

## 2022-06-10 DIAGNOSIS — G4733 Obstructive sleep apnea (adult) (pediatric): Secondary | ICD-10-CM | POA: Diagnosis not present

## 2022-06-19 ENCOUNTER — Encounter: Payer: Self-pay | Admitting: *Deleted

## 2022-06-19 ENCOUNTER — Ambulatory Visit: Payer: Self-pay | Admitting: *Deleted

## 2022-06-19 NOTE — Patient Instructions (Signed)
Visit Information  Thank you for taking time to visit with me today. Please don't hesitate to contact me if I can be of assistance to you.   Following are the goals we discussed today:   Goals Addressed               This Visit's Progress     Receive Assistance Applying for Medicaid. (pt-stated)   On track     Care Coordination Interventions:  Interventions Today    Flowsheet Row Most Recent Value  Chronic Disease   Chronic disease during today's visit Diabetes, Hypertension (HTN), Atrial Fibrillation (AFib), Other  General Interventions   General Interventions Discussed/Reviewed General Interventions Discussed, Labs, General Interventions Reviewed, Annual Eye Exam, Durable Medical Equipment (DME), Annual Foot Exam, Community Resources, Level of Care, Communication with, Doctor Visits, Vaccines, Health Screening  [Encouraged]  Labs Hgb A1c every 3 months  [Encouraged]  Vaccines COVID-19, Flu, Pneumonia, RSV, Shingles, Tetanus/Pertussis/Diphtheria  [Encouraged]  Doctor Visits Discussed/Reviewed Doctor Visits Discussed, Specialist, Doctor Visits Reviewed, Annual Wellness Visits, PCP  [Encouraged]  Health Screening Bone Density, Colonoscopy, Mammogram  [Encouraged]  Durable Medical Equipment (DME) Walker, BP Cuff, Glucomoter  [Encouraged]  PCP/Specialist Visits Compliance with follow-up visit  [Encouraged]  Communication with PCP/Specialists, RN  [Encouraged]  Level of Care Applications  [Encouraged]  Applications Medicaid, Personal Care Services  [Encouraged]  Exercise Interventions   Exercise Discussed/Reviewed Exercise Discussed, Assistive device use and maintanence, Exercise Reviewed, Physical Activity, Weight Managment  [Encouraged]  Physical Activity Discussed/Reviewed Physical Activity Discussed, Home Exercise Program (HEP), Physical Activity Reviewed, Types of exercise  [Encouraged]  Weight Management Weight loss  [Encouraged]  Education Interventions   Education Provided  Provided Therapist, sports, Provided Web-based Education, Provided Education  [Encouraged]  Provided Engineer, petroleum On Development worker, community, [employer], When to see the doctor, Mental Health/Coping with Illness, Nutrition, Foot Care, Eye Care, Blood Sugar Monitoring, Applications, Exercise, Medication  [Encouraged]  Applications Medicaid, Personal Care Services  [Encouraged]  Mental Health Interventions   Mental Health Discussed/Reviewed Other, Crisis, Suicide, Coping Strategies, Substance Abuse, Grief and Loss, Mental Health Reviewed, Depression, Anxiety, Mental Health Discussed  [Domestic Violence]  Nutrition Interventions   Nutrition Discussed/Reviewed Nutrition Discussed, Nutrition Reviewed, Adding fruits and vegetables, Increaing proteins, Decreasing fats, Decreasing salt, Decreasing sugar intake, Portion sizes, Carbohydrate meal planning, Fluid intake  [Encouraged]  Pharmacy Interventions   Pharmacy Dicussed/Reviewed Pharmacy Topics Discussed, Pharmacy Topics Reviewed, Medication Adherence, Affording Medications  [Encouraged]  Safety Interventions   Safety Discussed/Reviewed Safety Discussed, Safety Reviewed, Fall Risk, Home Safety  [Encouraged]  Home Safety Assistive Devices, Need for home safety assessment, Refer for community resources  [Encouraged]  Advanced Directive Interventions   Advanced Directives Discussed/Reviewed Advanced Directives Discussed  [Encouraged]       Active Listening & Reflection Utilized.  Verbalization of Feelings Encouraged.  Emotional Support Provided. Feelings of Stress Regarding Financial Insecurity Validated. Symptoms of Anxiety Acknowledged. Crisis Support Information, Agencies, Services & Resources Revisited. Problem Solving Interventions Activated. Task-Centered Solutions Employed.   Solution-Focused Strategies Implemented. Acceptance & Commitment Therapy Performed. Cognitive Behavioral Therapy Initiated. Client-Centered Therapy  Indicated. Condolences Offered Regarding Termination of Employment, for Both You & Husband. Deep Breathing Exercises, Relaxation Techniques & Mindfulness Meditation Strategies Taught & Implementation Encouraged Daily. [name] Collaboration with Tax adviser, Dr. Maxwell Caul at [facility] Provider, Dr. Elfredia Nevins with [facility] [phone]), to Inquire As to Whether or Not You Would Be Eligible to Receive Medication Assistance through The Extra Help/Low-Income Subsidy Program. Confirmed Termination  of [employer] through Employer [employer]) & Immunologist for [employer], through [employer], Due to Age Requirement of 75 Years of Age or Older, Unless You Have a Qualifying Disability or Medical Condition, In Which Case, You May Be Eligible for Medicare at Age 13. Encouraged to Visit Social Security Administration in [place] ([address], [place], [place] [address]; # [id number]) or Contact the Medicare HelpLine (# 800.MEDICARE; (339)863-8635) to Inquire About Benefits.  Encouraged Contact with Renda Rolls, Representative at [employer] [phone]) to Inquire About Other Insurance Options, Such As An Individual Health Plan, Medicaid or Short-Term Medical Insurance. [name] Collaboration with Connye Burkitt, Geophysical data processor with Previous Employer, [employer] [phone]), to [employer] Act, Short-Term Disability, Long-Term Disability & [employer].  ~ HIPAA Compliant Messages Left on Voicemail, As [name] Awaits a Return             Call. Confirmed Receipt & Thoroughly Reviewed the Following List of [employer], Services, Wellsite geologist, Emailed on 06/19/2022:    ~ Neurosurgeon in [place]  ~ Chief Technology Officer  ~ Low Income Energy Assistance Program  Application  ~ Albertson's Assistance Application   ~ Information You Need to Apply for Social Security Disability Benefits  ~ Social Security Disability Application  ~ Social Security Disability Form for Physicians  ~ 2023 Medicaid Tips  ~ [employer] Energy Application ~ Instructions for How to Apply for [employer] Brands ~ Making Medicaid Accessible through [employer] ~ Supplemental Nutrition Assistance Program Application ~ [employer] Support & Nutrition Program Referral Form ~ Meals on Wheels Brochure  Encouraged Animal nutritionist, Services & Resources of Interest, in An Effort to [employer]. Encouraged Submission of Completed & Signed Medicaid Application to [facility] of Social Services [phone]), for Processing. Encouraged Submission of Completed & Signed [place] Financial Assistance Program Application to [facility] Office Attention: Customer Service, [address] [place], [place] [address]. Encouraged Careers information officer with [name] (# [phone]), If You Have Questions, Need Assistance, or If Additional Social Work Needs Are Identified Between Now & Our Next Scheduled Follow-Up Outreach Call.      Our next appointment is by telephone on 07/03/2022 at 10:15 am.   Please call the care guide team at [phone] if you need to cancel or reschedule your appointment.   If you are experiencing a Mental Health or Behavioral Health Crisis or need someone to talk to, please call the Suicide and Crisis Lifeline: 988 call the [place] National Suicide Prevention Lifeline: (909)829-4081 or TTY: (864) 443-1786 TTY 901-224-0532) to talk to a trained counselor call 1-800-273-TALK (toll free, 24 hour hotline) go to [facility] [address] [place] [phone]) call the [facility] Crisis Line: [phone] call 911  Patient verbalizes understanding of  instructions and care plan provided today and agrees to view in MyChart. Active MyChart status and patient understanding of how to access instructions and care plan via MyChart confirmed with patient.     Telephone follow up appointment with care management team member scheduled for:  07/03/2022 at 10:15 am.   Danford Bad, BSW, MSW, LCSW  Licensed Clinical Social Worker  [facility]  Mailing [place] [place], [place] [address] Physical Address[address], [place], [place] [address] Toll Free Main # [phone] Fax # [phone] Cell #  [email or web address]

## 2022-06-19 NOTE — Patient Outreach (Signed)
Care Coordination   Follow Up Visit Note   06/19/2022  Name: Mahogony Harrier MRN: [id number] DOB: 1960-12-27  Magnolia Kowitz is a 62 y.o. year old female who sees Elfredia Nevins, MD for primary care. I spoke with [name] by phone today.  What matters to the patients health and wellness today?  Receive Assistance Applying for Medicaid.   Goals Addressed               This Visit's Progress     Receive Assistance Applying for Medicaid. (pt-stated)   On track     Care Coordination Interventions:  Interventions Today    Flowsheet Row Most Recent Value  Chronic Disease   Chronic disease during today's visit Diabetes, Hypertension (HTN), Atrial Fibrillation (AFib), Other  General Interventions   General Interventions Discussed/Reviewed General Interventions Discussed, Labs, General Interventions Reviewed, Annual Eye Exam, Durable Medical Equipment (DME), Annual Foot Exam, Community Resources, Level of Care, Communication with, Doctor Visits, Vaccines, Health Screening  [Encouraged]  Labs Hgb A1c every 3 months  [Encouraged]  Vaccines COVID-19, Flu, Pneumonia, RSV, Shingles, Tetanus/Pertussis/Diphtheria  [Encouraged]  Doctor Visits Discussed/Reviewed Doctor Visits Discussed, Specialist, Doctor Visits Reviewed, Annual Wellness Visits, PCP  [Encouraged]  Health Screening Bone Density, Colonoscopy, Mammogram  [Encouraged]  Durable Medical Equipment (DME) Walker, BP Cuff, Glucomoter  [Encouraged]  PCP/Specialist Visits Compliance with follow-up visit  [Encouraged]  Communication with PCP/Specialists, RN  [Encouraged]  Level of Care Applications  [Encouraged]  Applications Medicaid, Personal Care Services  [Encouraged]  Exercise Interventions   Exercise Discussed/Reviewed Exercise Discussed, Assistive device use and maintanence, Exercise Reviewed, Physical Activity, Weight Managment  [Encouraged]  Physical Activity Discussed/Reviewed Physical Activity Discussed, Home  Exercise Program (HEP), Physical Activity Reviewed, Types of exercise  [Encouraged]  Weight Management Weight loss  [Encouraged]  Education Interventions   Education Provided Provided Therapist, sports, Provided Web-based Education, Provided Education  [Encouraged]  Provided Engineer, petroleum On Development worker, community, [employer], When to see the doctor, Mental Health/Coping with Illness, Nutrition, Foot Care, Eye Care, Blood Sugar Monitoring, Applications, Exercise, Medication  [Encouraged]  Applications Medicaid, Personal Care Services  [Encouraged]  Mental Health Interventions   Mental Health Discussed/Reviewed Other, Crisis, Suicide, Coping Strategies, Substance Abuse, Grief and Loss, Mental Health Reviewed, Depression, Anxiety, Mental Health Discussed  [Domestic Violence]  Nutrition Interventions   Nutrition Discussed/Reviewed Nutrition Discussed, Nutrition Reviewed, Adding fruits and vegetables, Increaing proteins, Decreasing fats, Decreasing salt, Decreasing sugar intake, Portion sizes, Carbohydrate meal planning, Fluid intake  [Encouraged]  Pharmacy Interventions   Pharmacy Dicussed/Reviewed Pharmacy Topics Discussed, Pharmacy Topics Reviewed, Medication Adherence, Affording Medications  [Encouraged]  Safety Interventions   Safety Discussed/Reviewed Safety Discussed, Safety Reviewed, Fall Risk, Home Safety  [Encouraged]  Home Safety Assistive Devices, Need for home safety assessment, Refer for community resources  [Encouraged]  Advanced Directive Interventions   Advanced Directives Discussed/Reviewed Advanced Directives Discussed  [Encouraged]       Active Listening & Reflection Utilized.  Verbalization of Feelings Encouraged.  Emotional Support Provided. Feelings of Stress Regarding Financial Insecurity Validated. Symptoms of Anxiety Acknowledged. Crisis Support Information, Agencies, Services & Resources Revisited. Problem Solving Interventions Activated. Task-Centered Solutions  Employed.   Solution-Focused Strategies Implemented. Acceptance & Commitment Therapy Performed. Cognitive Behavioral Therapy Initiated. [name]. Condolences Offered Regarding Termination of Employment, for Both You & Husband. Deep Breathing Exercises, Relaxation Techniques & Mindfulness Meditation Strategies Taught & Implementation Encouraged Daily. [name] Collaboration with Tax adviser, Dr. Maxwell Caul at [facility] Provider, Dr. Elfredia Nevins with [facility] (#  [phone]), to Inquire As to Whether or Not You Would Be Eligible to Receive Medication Assistance through The Extra Help/Low-Income Subsidy Program. Confirmed Termination of [employer] through Employer [facility] [employer]) & Explained Ineligibility for [employer], through Hum[employer], Due to Age Requirement of 26 Years of Age or Older, Unless You Have a Qualifying Disability or Medical Condition, In Which Case, You May Be Eligible for Medicare at Age 50. Encouraged to Visit Social Security Administration in [place] ([address], [place], [place] [address]; # [id number]) or Contact the Medicare HelpLine (# 800.MEDICARE; 236-323-5882) to Inquire About Benefits.  Encouraged Contact with Renda Rolls, Representative at [employer] [phone]) to Inquire About Other Insurance Options, Such As An Individual Health Plan, Medicaid or Short-Term Medical Insurance. [name] Collaboration with Connye Burkitt, Geophysical data processor with Previous Employer, [employer] [phone]), to Est[employer] Act, Short-Term Disability, Long-Term Disability & [employer].  ~ HIPAA Compliant Messages Left on Voicemail, As [name] Awaits a Return             Call. Confirmed Receipt & Thoroughly Reviewed the Following List of [employer], Services, Wellsite geologist, Emailed on  06/19/2022:    ~ Neurosurgeon in [place]  ~ Chief Technology Officer  ~ Low Income Energy Assistance Program Application  ~ Albertson's Assistance Application   ~ Information You Need to Apply for Social Security Disability Benefits  ~ Social Security Disability Application  ~ Social Security Disability Form for Physicians  ~ 2023 Medicaid Tips  ~ O[employer] Energy Application ~ Instructions for How to Apply for Y[employer] Brands ~ Making Medicaid Accessible through [employer] ~ Supplemental Nutrition Assistance Program Application ~ [employer] Support & Nutrition Program Referral Form ~ Meals on Wheels Brochure  Encouraged Animal nutritionist, Services & Resources of Interest, in An Effort to [employer]. Encouraged Submission of Completed & Signed Medicaid Application to [facility] of Social Services [phone]), for Processing. Encouraged Submission of Completed & Signed [place] Financial Assistance Program Application to [facility] Office Attention: Customer Service, [address] [place], [place] [address]. Encouraged Careers information officer with [name] (# [phone]), If You Have Questions, Need Assistance, or If Additional Social Work Needs Are Identified Between Now & Our Next Scheduled Follow-Up Outreach Call.      SDOH assessments and interventions completed:  Yes.  Care Coordination Interventions:  Yes, provided.   Follow up plan: Follow up call scheduled for 07/03/2022 at 10:15 am.   Encounter Outcome:  Pt. Visit Completed.   Danford Bad, BSW, MSW, LCSW  Licensed Restaurant manager, fast food [facility]  Mailing [place] [address] [place], [place] [address] Physical Address[address], [place], [place] [address] Toll Free Main # [phone] Fax # [phone] Cell #  [phone] [email or web address]

## 2022-06-20 ENCOUNTER — Ambulatory Visit: Payer: Self-pay | Admitting: *Deleted

## 2022-06-20 NOTE — Patient Outreach (Signed)
  Care Coordination   06/20/2022 Name: Kaeya Powe MRN: [id number] DOB: 1960-09-04   Care Coordination Outreach Attempts:  An unsuccessful telephone outreach was attempted for a scheduled appointment today.  Follow Up Plan:  Additional outreach attempts will be made to offer the patient care coordination information and services.   Encounter Outcome:  No Answer. Left HIPAA compliant VM message.   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

## 2022-06-23 ENCOUNTER — Other Ambulatory Visit: Payer: Self-pay | Admitting: Orthopaedic Surgery

## 2022-06-25 ENCOUNTER — Telehealth: Payer: Self-pay | Admitting: *Deleted

## 2022-06-25 NOTE — Progress Notes (Signed)
  Care Coordination Note  06/25/2022 Name: Quinya Zaunbrecher MRN: [id number] DOB: Sep 26, 1960  Becky [name] is a 62 y.o. year old female who is a primary care patient of Elfredia Nevins, MD and is actively engaged with the care management team. I reached out to [name] by phone today to assist with re-scheduling a follow up visit with the RN Case Manager  Follow up plan: Unsuccessful telephone outreach attempt made. A HIPAA compliant phone message was left for the patient providing contact information and requesting a return call.   [facility]  Care Coordination Care Guide  Direct Dial: [phone]

## 2022-06-25 NOTE — Progress Notes (Signed)
  Care Coordination Note  06/25/2022 Name: Becky [name] MRN: [id number] DOB: 1960/07/15  Becky [name] is a 62 y.o. year old female who is a primary care patient of Elfredia Nevins, MD and is actively engaged with the care management team. I reached out to [name] by phone today to assist with re-scheduling a follow up visit with the RN Case Manager  Follow up plan: Telephone appointment with care management team member scheduled for:07/07/22 [facility] Coordination Care Guide  Direct Dial: [phone]

## 2022-06-30 ENCOUNTER — Other Ambulatory Visit: Payer: Self-pay | Admitting: *Deleted

## 2022-06-30 DIAGNOSIS — I480 Paroxysmal atrial fibrillation: Secondary | ICD-10-CM

## 2022-06-30 DIAGNOSIS — M7541 Impingement syndrome of right shoulder: Secondary | ICD-10-CM | POA: Diagnosis not present

## 2022-06-30 MED ORDER — RIVAROXABAN 20 MG PO TABS: 20.0000 mg | ORAL_TABLET | Freq: Every day | ORAL | 1 refills | Status: DC

## 2022-06-30 NOTE — Telephone Encounter (Signed)
Prescription refill request for Xarelto received.  Indication: PAF Last office visit: 03/19/22  Dominga Ferry MD Weight: 114kg Age: 62 Scr: 0.93 on 01/30/22  Epic CrCl: 114.32  Based on above findings Xarelto 20mg  daily is the appropriate dose.  Refill approved/

## 2022-07-01 ENCOUNTER — Other Ambulatory Visit: Payer: Self-pay | Admitting: Adult Health

## 2022-07-03 ENCOUNTER — Encounter: Payer: Self-pay | Admitting: *Deleted

## 2022-07-03 ENCOUNTER — Ambulatory Visit: Payer: Self-pay | Admitting: *Deleted

## 2022-07-03 NOTE — Patient Outreach (Signed)
Care Coordination   Follow Up Visit Note   07/03/2022  Name: Jackalynn Art MRN: [id number] DOB: 26-Jun-1960  Choua Ikner is a 62 y.o. year old female who sees Elfredia Nevins, MD for primary care. I spoke with [name] by phone today.  What matters to the patients health and wellness today?  Receive Assistance Applying for Medicaid.   Goals Addressed               This Visit's Progress     Receive Assistance Applying for Medicaid. (pt-stated)   On track     Care Coordination Interventions:  Interventions Today    Flowsheet Row Most Recent Value  Chronic Disease   Chronic disease during today's visit Diabetes, Hypertension (HTN), Atrial Fibrillation (AFib), Other  General Interventions   General Interventions Discussed/Reviewed General Interventions Discussed, Labs, General Interventions Reviewed, Annual Eye Exam, Durable Medical Equipment (DME), Annual Foot Exam, Community Resources, Level of Care, Communication with, Doctor Visits, Vaccines, Health Screening  [Encouraged]  Labs Hgb A1c every 3 months  [Encouraged]  Vaccines COVID-19, Flu, Pneumonia, RSV, Shingles, Tetanus/Pertussis/Diphtheria  [Encouraged]  Doctor Visits Discussed/Reviewed Doctor Visits Discussed, Specialist, Doctor Visits Reviewed, Annual Wellness Visits, PCP  [Encouraged]  Health Screening Bone Density, Colonoscopy, Mammogram  [Encouraged]  Durable Medical Equipment (DME) Walker, BP Cuff, Glucomoter  [Encouraged]  PCP/Specialist Visits Compliance with follow-up visit  [Encouraged]  Communication with PCP/Specialists, RN  [Encouraged]  Level of Care Applications  [Encouraged]  Applications Medicaid, Personal Care Services  [Encouraged]  Exercise Interventions   Exercise Discussed/Reviewed Exercise Discussed, Assistive device use and maintanence, Exercise Reviewed, Physical Activity, Weight Managment  [Encouraged]  Physical Activity Discussed/Reviewed Physical Activity Discussed, Home  Exercise Program (HEP), Physical Activity Reviewed, Types of exercise  [Encouraged]  Weight Management Weight loss  [Encouraged]  Education Interventions   Education Provided Provided Therapist, sports, Provided Web-based Education, Provided Education  [Encouraged]  Provided Engineer, petroleum On Development worker, community, [employer], When to see the doctor, Mental Health/Coping with Illness, Nutrition, Foot Care, Eye Care, Blood Sugar Monitoring, Applications, Exercise, Medication  [Encouraged]  Applications Medicaid, Personal Care Services  [Encouraged]  Mental Health Interventions   Mental Health Discussed/Reviewed Other, Crisis, Suicide, Coping Strategies, Substance Abuse, Grief and Loss, Mental Health Reviewed, Depression, Anxiety, Mental Health Discussed  [Domestic Violence]  Nutrition Interventions   Nutrition Discussed/Reviewed Nutrition Discussed, Nutrition Reviewed, Adding fruits and vegetables, Increaing proteins, Decreasing fats, Decreasing salt, Decreasing sugar intake, Portion sizes, Carbohydrate meal planning, Fluid intake  [Encouraged]  Pharmacy Interventions   Pharmacy Dicussed/Reviewed Pharmacy Topics Discussed, Pharmacy Topics Reviewed, Medication Adherence, Affording Medications  [Encouraged]  Safety Interventions   Safety Discussed/Reviewed Safety Discussed, Safety Reviewed, Fall Risk, Home Safety  [Encouraged]  Home Safety Assistive Devices, Need for home safety assessment, Refer for community resources  [Encouraged]  Advanced Directive Interventions   Advanced Directives Discussed/Reviewed Advanced Directives Discussed  [Encouraged]       Active Listening & Reflection Utilized.  Verbalization of Feelings Encouraged.  Emotional Support Provided. Feelings of Hopefulness & Relief Validated. Diminished Symptoms of Anxiety & Stress Acknowledged. Problem Solving Interventions Activated. Task-Centered Solutions Employed.   Solution-Focused Strategies Implemented. Acceptance &  Commitment Therapy Performed. Cognitive Behavioral Therapy Initiated. [name]. Deep Breathing Exercises, Relaxation Techniques & Mindfulness Meditation Strategies Reviewed & Implementation Encouraged Daily. Confirmed Submission of Medicaid Applications, for Both You & Husband, to [facility] of Social Services [phone]) for Processing, on 06/24/2022. [name] Collaboration with Kristin Bruins, Medicaid Case Worker with [facility] Department  of Social Services [phone]), to [employer] of Medicaid, for Both You & Husband, Effective 07/07/2022. Confirmed Submission of Medicare Applications, for Both You & Husband, to Social Security Administration in [place] (# [phone]) for Processing, on 06/23/2022. [name] Energy manager from [employer] in [place] (# [phone]), to Confirm Denial of Medicare, for Both You & Husband, Due to Not Meeting Eligibility Requirements & Exceeding Income Guidelines for The [place] of [place] for [employer] 2024.  Confirmed Submission of Research officer, political party, for Both You & Husband, to Social Security Administration in [place] (# [phone]) for Processing, on 06/23/2022. [name] Energy manager from [employer] in [place] (# [phone]), to Confirm Denial of Social Security Disability, for Both You & Husband, Due to Not Meeting Eligibility Requirements & Exceeding Income Guidelines for The [place] of [place] for [employer] 2024.  Confirmed Submission of Supplemental Nutrition Assistance Program Application, for Both You & Husband, to The [facility] of Social Services [phone]) for Processing, on 06/23/2022. [name] Collaboration with Representative from [facility] of Social Services [phone]), to Confirm Denial of  Supplemental Nutrition Assistance Program Services, for Both You & Husband, Due to Not Meeting Eligibility Requirements & Exceeding Income Guidelines for The [place] of [place] for [employer] 2024. Encouraged Contact with Connye Burkitt, Geophysical data processor with Husband's Previous Employer, [employer] [phone]), to Inquire About Him Taking Early Retirement. Continued Review of The Following List of [employer], Services, Resources, Occupational psychologist, to [employer] & Entertain Questions:     ~ Neurosurgeon in [place]   ~ Chief Technology Officer   ~ Low Income Energy Assistance Program Application   ~ [employer] Health Financial Assistance Application    ~ [employer] Support & Nutrition Program Referral Form     ~ Meals on Wheels Brochure  Encouraged Animal nutritionist, Services, & Resources of Interest, in An Effort to [employer]. Encouraged Barrister's clerk for [employer] Program & Low Income Energy Assistance Program, for Processing, in An Effort to [employer] with Utilities. Encouraged Submission of Application for [employer] Assistance Program to [facility], Attention: Customer Service, [address], [place], [place] [address], for Processing, in An Effort to [employer] Assistance with Medical Expenses. Confirmed Disinterest in Applying for [employer] & Nutrition Program, As Well As Meals on Wheels, in An Effort to Obtain Food & Nutritional Assistance. Encouraged Careers information officer with [name] (# [phone]), If You Have Questions, Need Assistance, or If Additional Social Work Needs Are Identified Between Now & Our Next Scheduled Follow-Up Outreach Call.      SDOH assessments and interventions completed:  Yes.  Care Coordination Interventions:  Yes, provided.   Follow up plan: Follow up call scheduled for  07/17/2022 at 10:45 am.  Encounter Outcome:  Pt. Visit Completed.   Danford Bad, BSW, MSW, LCSW  Licensed Restaurant manager, fast food [facility]  Mailing [place] [place], [place] [address] Physical Address[address], [place], [place] [address] Toll Free Main # [phone] Fax # [phone] Cell # [phone] [email or web address]

## 2022-07-03 NOTE — Patient Instructions (Signed)
Visit Information  Thank you for taking time to visit with me today. Please don't hesitate to contact me if I can be of assistance to you.   Following are the goals we discussed today:   Goals Addressed               This Visit's Progress     Receive Assistance Applying for Medicaid. (pt-stated)   On track     Care Coordination Interventions:  Interventions Today    Flowsheet Row Most Recent Value  Chronic Disease   Chronic disease during today's visit Diabetes, Hypertension (HTN), Atrial Fibrillation (AFib), Other  General Interventions   General Interventions Discussed/Reviewed General Interventions Discussed, Labs, General Interventions Reviewed, Annual Eye Exam, Durable Medical Equipment (DME), Annual Foot Exam, Community Resources, Level of Care, Communication with, Doctor Visits, Vaccines, Health Screening  [Encouraged]  Labs Hgb A1c every 3 months  [Encouraged]  Vaccines COVID-19, Flu, Pneumonia, RSV, Shingles, Tetanus/Pertussis/Diphtheria  [Encouraged]  Doctor Visits Discussed/Reviewed Doctor Visits Discussed, Specialist, Doctor Visits Reviewed, Annual Wellness Visits, PCP  [Encouraged]  Health Screening Bone Density, Colonoscopy, Mammogram  [Encouraged]  Durable Medical Equipment (DME) Walker, BP Cuff, Glucomoter  [Encouraged]  PCP/Specialist Visits Compliance with follow-up visit  [Encouraged]  Communication with PCP/Specialists, RN  [Encouraged]  Level of Care Applications  [Encouraged]  Applications Medicaid, Personal Care Services  [Encouraged]  Exercise Interventions   Exercise Discussed/Reviewed Exercise Discussed, Assistive device use and maintanence, Exercise Reviewed, Physical Activity, Weight Managment  [Encouraged]  Physical Activity Discussed/Reviewed Physical Activity Discussed, Home Exercise Program (HEP), Physical Activity Reviewed, Types of exercise  [Encouraged]  Weight Management Weight loss  [Encouraged]  Education Interventions   Education Provided  Provided Therapist, sports, Provided Web-based Education, Provided Education  [Encouraged]  Provided Engineer, petroleum On Development worker, community, [employer], When to see the doctor, Mental Health/Coping with Illness, Nutrition, Foot Care, Eye Care, Blood Sugar Monitoring, Applications, Exercise, Medication  [Encouraged]  Applications Medicaid, Personal Care Services  [Encouraged]  Mental Health Interventions   Mental Health Discussed/Reviewed Other, Crisis, Suicide, Coping Strategies, Substance Abuse, Grief and Loss, Mental Health Reviewed, Depression, Anxiety, Mental Health Discussed  [Domestic Violence]  Nutrition Interventions   Nutrition Discussed/Reviewed Nutrition Discussed, Nutrition Reviewed, Adding fruits and vegetables, Increaing proteins, Decreasing fats, Decreasing salt, Decreasing sugar intake, Portion sizes, Carbohydrate meal planning, Fluid intake  [Encouraged]  Pharmacy Interventions   Pharmacy Dicussed/Reviewed Pharmacy Topics Discussed, Pharmacy Topics Reviewed, Medication Adherence, Affording Medications  [Encouraged]  Safety Interventions   Safety Discussed/Reviewed Safety Discussed, Safety Reviewed, Fall Risk, Home Safety  [Encouraged]  Home Safety Assistive Devices, Need for home safety assessment, Refer for community resources  [Encouraged]  Advanced Directive Interventions   Advanced Directives Discussed/Reviewed Advanced Directives Discussed  [Encouraged]       Active Listening & Reflection Utilized.  Verbalization of Feelings Encouraged.  Emotional Support Provided. Feelings of Hopefulness & Relief Validated. Diminished Symptoms of Anxiety & Stress Acknowledged. Problem Solving Interventions Activated. Task-Centered Solutions Employed.   Solution-Focused Strategies Implemented. Acceptance & Commitment Therapy Performed. Cognitive Behavioral Therapy Initiated. [name]. Deep Breathing Exercises, Relaxation Techniques & Mindfulness  Meditation Strategies Reviewed & Implementation Encouraged Daily. Confirmed Submission of Medicaid Applications, for Both You & Husband, to [facility] of Social Services [phone]) for Processing, on 06/24/2022. [name] Collaboration with Kristin Bruins, Medicaid Case Worker with [facility] Department of Social Services [phone]), to Confirm Approval of Medicaid, for Both You & Husband, Effective 07/07/2022. Confirmed Submission of Medicare Applications, for Both You &  Husband, to [employer] in [place] [phone]) for Processing, on 06/23/2022. [name] Energy manager from [employer] in [place] (# [phone]), to Confirm Denial of Medicare, for Both You & Husband, Due to Not Meeting Eligibility Requirements & Exceeding Income Guidelines for The [place] of [place] for [employer] 2024.  Confirmed Submission of Research officer, political party, for Both You & Husband, to Social Security Administration in [place] (# [phone]) for Processing, on 06/23/2022. [name] Energy manager from [employer] in [place] (# [phone]), to Confirm Denial of Social Security Disability, for Both You & Husband, Due to Not Meeting Eligibility Requirements & Exceeding Income Guidelines for The [place] of [place] for [employer] 2024.  Confirmed Submission of Supplemental Nutrition Assistance Program Application, for Both You & Husband, to The [facility] of Social Services [phone]) for Processing, on 06/23/2022. [name] Collaboration with Representative from The [facility] of Social Services [phone]), to Confirm Denial of Supplemental Nutrition Assistance Program Services, for Both You & Husband, Due to Not Meeting Eligibility Requirements & Exceeding Income Guidelines for The [place] of [place] for [employer] 2024. Encouraged Contact with Connye Burkitt, Geophysical data processor with Husband's Previous Employer, [employer] [phone]), to Inquire About Him Taking Early Retirement. Continued Review of The Following List of [employer], Services, Resources, Occupational psychologist, to [employer] & Entertain Questions:     ~ Neurosurgeon in [place]   ~ Chief Technology Officer   ~ Low Income Energy Assistance Program Application   ~ [employer] Health Financial Assistance Application    ~ [employer] Support & Nutrition Program Referral Form     ~ Meals on Wheels Brochure  Encouraged Animal nutritionist, Services, & Resources of Interest, in An Effort to [employer]. Encouraged Barrister's clerk for [employer] Program & Low Income Energy Assistance Program, for Processing, in An Effort to [employer] with Utilities. Encouraged Submission of Application for [employer] Assistance Program to [facility], Attention: Customer Service, [address], [place], [place] [address], for Processing, in An Effort to [employer] Assistance with Medical Expenses. Confirmed Disinterest in Applying for [employer] & Nutrition Program, As Well As Meals on Wheels, in An Effort to Obtain Food & Nutritional Assistance. Encouraged Careers information officer with [name] (# [phone]), If You Have Questions, Need Assistance, or If Additional Social Work Needs Are Identified Between Now & Our Next Scheduled Follow-Up Outreach Call.      Our next appointment is by telephone on 07/17/2022 at 10:45 am.  Please call the care guide team at [phone] if you need to cancel or reschedule your appointment.   If you are experiencing a Mental Health or Behavioral Health Crisis or need someone to talk to, please call the Suicide and Crisis Lifeline: 988 call the  [place] National Suicide Prevention Lifeline: 2015234020 or TTY: (313)044-0940 TTY (818)373-1048) to talk to a trained counselor call 1-800-273-TALK (toll free, 24 hour hotline) go to [facility] [address], [place] [phone]) call the [facility] Crisis Line: [phone] call 911  Patient verbalizes understanding of instructions and care plan provided today and agrees to view in MyChart. Active MyChart status and patient understanding of how to access instructions and care plan via MyChart confirmed with patient.     Telephone follow up appointment with care management team member scheduled for:  07/17/2022 at 10:45 am.  Danford Bad, BSW, MSW, LCSW  Licensed Clinical Social Worker  [facility]  Mailing [place]. [address], [place], [place] [address] Physical Address[address], [place], [place] [address] Toll Free Main # [phone] Fax # [phone] Cell # [phone] [email or web address]

## 2022-07-07 ENCOUNTER — Ambulatory Visit: Payer: Self-pay | Admitting: *Deleted

## 2022-07-07 NOTE — Patient Outreach (Signed)
  Care Coordination   07/07/2022 Name: Aprill Ramus MRN: [id number] DOB: 04/11/1960   Care Coordination Outreach Attempts:  An unsuccessful telephone outreach was attempted for a scheduled appointment today.  Follow Up Plan:  Additional outreach attempts will be made to offer the patient care coordination information and services.   Encounter Outcome:  No Answer. Left HIPAA compliant VM.   Care Coordination Interventions:  No, not indicated    Demetrios Loll, BSN, RN-BC RN Care Coordinator [facility]  [facility] Direct Dial: [phone] Main #: [phone]

## 2022-07-11 ENCOUNTER — Telehealth: Payer: Self-pay | Admitting: Cardiology

## 2022-07-11 NOTE — Telephone Encounter (Signed)
Reached out to [facility] - they state cost is $21/month.   Returned call to patient, she states they did not run "coupon" and would have made her pay the full amount this am.  Assured her the problem had been fixed

## 2022-07-11 NOTE — Telephone Encounter (Signed)
Pt c/o medication issue:  1. Name of Medication: diltiazem (CARDIZEM CD) 360 MG 24 hr capsule   2. How are you currently taking this medication (dosage and times per day)?   TAKE 1 CAPSULE BY MOUTH EVERY DAYP    3. Are you having a reaction (difficulty breathing--STAT)?   4. What is your medication issue? Patient called stating it she can't afford this medication, it going to cost her $155 for a 30 day supply.  She is wondering if there something that can be done that can make if cheaper, if it can be called into [facility] pharmacy or if we have samples.  Patient states she has applied for medicaid.

## 2022-07-17 ENCOUNTER — Telehealth: Payer: Self-pay

## 2022-07-17 ENCOUNTER — Encounter: Payer: Self-pay | Admitting: *Deleted

## 2022-07-17 ENCOUNTER — Ambulatory Visit: Payer: Self-pay | Admitting: *Deleted

## 2022-07-17 NOTE — Telephone Encounter (Signed)
Patient called stating she has been having some right leg swelling and is on BT's she would like to know if it is ok for her to get massage cupping to break up scar tissue. Please advise. [phone]

## 2022-07-17 NOTE — Patient Instructions (Signed)
Visit Information  Thank you for taking time to visit with me today. Please don't hesitate to contact me if I can be of assistance to you.   Following are the goals we discussed today:   Goals Addressed               This Visit's Progress     Receive Assistance Applying for Medicaid. (pt-stated)   On track     Care Coordination Interventions:  Interventions Today    Flowsheet Row Most Recent Value  Chronic Disease   Chronic disease during today's visit Diabetes, Hypertension (HTN), Atrial Fibrillation (AFib), Other  General Interventions   General Interventions Discussed/Reviewed General Interventions Discussed, Labs, General Interventions Reviewed, Annual Eye Exam, Durable Medical Equipment (DME), Annual Foot Exam, Community Resources, Level of Care, Communication with, Doctor Visits, Vaccines, Health Screening  [Encouraged]  Labs Hgb A1c every 3 months  [Encouraged]  Vaccines COVID-19, Flu, Pneumonia, RSV, Shingles, Tetanus/Pertussis/Diphtheria  [Encouraged]  Doctor Visits Discussed/Reviewed Doctor Visits Discussed, Specialist, Doctor Visits Reviewed, Annual Wellness Visits, PCP  [Encouraged]  Health Screening Bone Density, Colonoscopy, Mammogram  [Encouraged]  Durable Medical Equipment (DME) Walker, BP Cuff, Glucomoter  [Encouraged]  PCP/Specialist Visits Compliance with follow-up visit  [Encouraged]  Communication with PCP/Specialists, RN  [Encouraged]  Level of Care Applications  [Encouraged]  Applications Medicaid, Personal Care Services  [Encouraged]  Exercise Interventions   Exercise Discussed/Reviewed Exercise Discussed, Assistive device use and maintanence, Exercise Reviewed, Physical Activity, Weight Managment  [Encouraged]  Physical Activity Discussed/Reviewed Physical Activity Discussed, Home Exercise Program (HEP), Physical Activity Reviewed, Types of exercise  [Encouraged]  Weight Management Weight loss  [Encouraged]  Education Interventions   Education Provided  Provided Therapist, sports, Provided Web-based Education, Provided Education  [Encouraged]  Provided Engineer, petroleum On Development worker, community, [employer], When to see the doctor, Mental Health/Coping with Illness, Nutrition, Foot Care, Eye Care, Blood Sugar Monitoring, Applications, Exercise, Medication  [Encouraged]  Applications Medicaid, Personal Care Services  [Encouraged]  Mental Health Interventions   Mental Health Discussed/Reviewed Other, Crisis, Suicide, Coping Strategies, Substance Abuse, Grief and Loss, Mental Health Reviewed, Depression, Anxiety, Mental Health Discussed  [Domestic Violence]  Nutrition Interventions   Nutrition Discussed/Reviewed Nutrition Discussed, Nutrition Reviewed, Adding fruits and vegetables, Increaing proteins, Decreasing fats, Decreasing salt, Decreasing sugar intake, Portion sizes, Carbohydrate meal planning, Fluid intake  [Encouraged]  Pharmacy Interventions   Pharmacy Dicussed/Reviewed Pharmacy Topics Discussed, Pharmacy Topics Reviewed, Medication Adherence, Affording Medications  [Encouraged]  Safety Interventions   Safety Discussed/Reviewed Safety Discussed, Safety Reviewed, Fall Risk, Home Safety  [Encouraged]  Home Safety Assistive Devices, Need for home safety assessment, Refer for community resources  [Encouraged]  Advanced Directive Interventions   Advanced Directives Discussed/Reviewed Advanced Directives Discussed  [Encouraged]       Active Listening & Reflection Utilized.  Verbalization of Feelings Encouraged.  Emotional Support Provided. Feelings of Hopefulness & Relief Validated. Diminished Symptoms of Anxiety & Stress Acknowledged. Problem Solving Interventions Activated. Task-Centered Solutions Employed.   Solution-Focused Strategies Implemented. Acceptance & Commitment Therapy Performed. Cognitive Behavioral Therapy Initiated. [name]. Implementation of Deep Breathing Exercises, Relaxation Techniques, &  Mindfulness Meditation Strategies Encouraged Daily. Assisted with Completion of Application for Low Income Energy Assistance Program & Encouraged Submission to The [facility] of Health & Human Services [phone]) for Processing, in An Effort to Obtain Financial Assistance with Utilities. Assisted with Completion of Application for [facility] Financial Assistance Program & Encouraged Submission to The [facility] Office [phone]), for Processing, in An  Effort to [employer] with Medical Expenses. Encouraged Careers information officer with [name] (# [phone]), If You Have Questions, Need Assistance, or If Additional Social Work Needs Are Identified Between Now & Our Next Scheduled Follow-Up Outreach Call.      Our next appointment is by telephone on 07/30/2022 at 9:45 am.   Please call the care guide team at [phone] if you need to cancel or reschedule your appointment.   If you are experiencing a Mental Health or Behavioral Health Crisis or need someone to talk to, please call the Suicide and Crisis Lifeline: 988 call the [place] National Suicide Prevention Lifeline: 224-147-1894 or TTY: (780)753-6119 TTY (269)279-2617) to talk to a trained counselor call 1-800-273-TALK (toll free, 24 hour hotline) go to [facility] [address], [place] [phone]) call the [facility] Crisis Line: [phone] call 911  Patient verbalizes understanding of instructions and care plan provided today and agrees to view in MyChart. Active MyChart status and patient understanding of how to access instructions and care plan via MyChart confirmed with patient.     Telephone follow up appointment with care management team member scheduled for:  07/30/2022 at 9:45 am.   Danford Bad, BSW, MSW, LCSW  Licensed Clinical Social Worker  [facility]  Mailing [place]. [address] [place], [place] [address] Physical Address[address], [place], [place] [address] Toll Free Main # [phone] Fax # [phone] Cell # [phone] [email or web address]

## 2022-07-17 NOTE — Patient Outreach (Signed)
Care Coordination   Follow Up Visit Note   07/17/2022  Name: Becky [name] MRN: [id number] DOB: 10-22-1960  Becky [name] is a 62 y.o. year old female who sees Elfredia Nevins, MD for primary care. I spoke with Becky [name] by phone today.  What matters to the patients health and wellness today?  Receive Assistance Applying for Medicaid.   Goals Addressed               This Visit's Progress     Receive Assistance Applying for Medicaid. (pt-stated)   On track     Care Coordination Interventions:  Interventions Today    Flowsheet Row Most Recent Value  Chronic Disease   Chronic disease during today's visit Diabetes, Hypertension (HTN), Atrial Fibrillation (AFib), Other  General Interventions   General Interventions Discussed/Reviewed General Interventions Discussed, Labs, General Interventions Reviewed, Annual Eye Exam, Durable Medical Equipment (DME), Annual Foot Exam, Community Resources, Level of Care, Communication with, Doctor Visits, Vaccines, Health Screening  [Encouraged]  Labs Hgb A1c every 3 months  [Encouraged]  Vaccines COVID-19, Flu, Pneumonia, RSV, Shingles, Tetanus/Pertussis/Diphtheria  [Encouraged]  Doctor Visits Discussed/Reviewed Doctor Visits Discussed, Specialist, Doctor Visits Reviewed, Annual Wellness Visits, PCP  [Encouraged]  Health Screening Bone Density, Colonoscopy, Mammogram  [Encouraged]  Durable Medical Equipment (DME) Walker, BP Cuff, Glucomoter  [Encouraged]  PCP/Specialist Visits Compliance with follow-up visit  [Encouraged]  Communication with PCP/Specialists, RN  [Encouraged]  Level of Care Applications  [Encouraged]  Applications Medicaid, Personal Care Services  [Encouraged]  Exercise Interventions   Exercise Discussed/Reviewed Exercise Discussed, Assistive device use and maintanence, Exercise Reviewed, Physical Activity, Weight Managment  [Encouraged]  Physical Activity Discussed/Reviewed Physical Activity Discussed, Home  Exercise Program (HEP), Physical Activity Reviewed, Types of exercise  [Encouraged]  Weight Management Weight loss  [Encouraged]  Education Interventions   Education Provided Provided Therapist, sports, Provided Web-based Education, Provided Education  [Encouraged]  Provided Engineer, petroleum On Development worker, community, [employer], When to see the doctor, Mental Health/Coping with Illness, Nutrition, Foot Care, Eye Care, Blood Sugar Monitoring, Applications, Exercise, Medication  [Encouraged]  Applications Medicaid, Personal Care Services  [Encouraged]  Mental Health Interventions   Mental Health Discussed/Reviewed Other, Crisis, Suicide, Coping Strategies, Substance Abuse, Grief and Loss, Mental Health Reviewed, Depression, Anxiety, Mental Health Discussed  [Domestic Violence]  Nutrition Interventions   Nutrition Discussed/Reviewed Nutrition Discussed, Nutrition Reviewed, Adding fruits and vegetables, Increaing proteins, Decreasing fats, Decreasing salt, Decreasing sugar intake, Portion sizes, Carbohydrate meal planning, Fluid intake  [Encouraged]  Pharmacy Interventions   Pharmacy Dicussed/Reviewed Pharmacy Topics Discussed, Pharmacy Topics Reviewed, Medication Adherence, Affording Medications  [Encouraged]  Safety Interventions   Safety Discussed/Reviewed Safety Discussed, Safety Reviewed, Fall Risk, Home Safety  [Encouraged]  Home Safety Assistive Devices, Need for home safety assessment, Refer for community resources  [Encouraged]  Advanced Directive Interventions   Advanced Directives Discussed/Reviewed Advanced Directives Discussed  [Encouraged]       Active Listening & Reflection Utilized.  Verbalization of Feelings Encouraged.  Emotional Support Provided. Feelings of Hopefulness & Relief Validated. Diminished Symptoms of Anxiety & Stress Acknowledged. Problem Solving Interventions Activated. Task-Centered Solutions Employed.   Solution-Focused Strategies Implemented. Acceptance &  Commitment Therapy Performed. Cognitive Behavioral Therapy Initiated. [name]. Implementation of Deep Breathing Exercises, Relaxation Techniques, & Mindfulness Meditation Strategies Encouraged Daily. Assisted with Completion of Application for Low Income Energy Assistance Program & Encouraged Submission to The [facility] of Health & Human Services [phone]) for Processing, in An Effort to Obtain Financial Assistance with Utilities. Assisted  with Completion of Application for [facility] Assistance Program & Encouraged Submission to The [facility] Office [phone]), for Processing, in An Effort to Obtain Financial Assistance with Medical Expenses. Encouraged Careers information officer with [name] (# [phone]), If You Have Questions, Need Assistance, or If Additional Social Work Needs Are Identified Between Now & Our Next Scheduled Follow-Up Outreach Call.      SDOH assessments and interventions completed:  Yes.  Care Coordination Interventions:  Yes, provided.   Follow up plan: Follow up call scheduled for 07/30/2022 at 9:45 am.   Encounter Outcome:  Pt. Visit Completed.   Danford Bad, BSW, MSW, LCSW  Licensed Restaurant manager, [facility]  Mailing [place] [address] [place], [place] [address] Physical Address-[address] [place], [place] [address] Toll Free Main # [phone] Fax # [phone] Cell # [phone] [email or web address]

## 2022-07-18 ENCOUNTER — Encounter: Payer: Self-pay | Admitting: *Deleted

## 2022-07-18 ENCOUNTER — Ambulatory Visit: Payer: Self-pay | Admitting: *Deleted

## 2022-07-18 NOTE — Patient Outreach (Signed)
Care Coordination   Follow Up Visit Note   07/18/2022 Name: Becky [name] MRN: [id number] DOB: 1960/02/07  Becky [name] is a 62 y.o. year old female who sees Elfredia Nevins, MD for primary care. I spoke with  [name] by phone today.  What matters to the patients health and wellness today?  Managing blood pressure and knee pain    Goals Addressed             This Visit's Progress    COMPLETED: Care Coordination Services       Care Coordination Goals: Patient will follow-up with PCP and/or specialist(s) as recommended Patient will take medications as prescribed Patient will reach out to RN Care Coordinator [phone] with any care coordination or resource needs Patient will utilize the [facility] 24 Hour Nurse/Concierge Line as needed [phone] Patient will talk with Danford Bad, LCSW [phone]) regarding insurance and disability       Manage Hypertension   On track    Care Coordination Goals: Patient will take medications as directed and report any negative side effects to provider  Patient will monitor and record blood pressure twice daily for next week and as needed for symptoms and will call PCP with any readings outside of recommended range Patient will schedule an appointment with PCP for blood pressure management Patient will take blood pressure log to PCP and specialty appointments for review Patient will reach out to RN Care Coordinator [phone] with any care coordination or resource needs       Manage Musckuloskeletal Pain   On track    Care Coordination Goals: Patient will keep all medical appointments Patient will reach out to provider with any new or worsening symptoms Patient will continue to exercise at the gym Per ortho, needs to work on building strength in quadriceps  Patient will increase activity level as tolerated and rest as needed Patient will reach out to RN Care Coordinator [phone] with any care coordination or  resource needs        SDOH assessments and interventions completed:  Yes SDOH Interventions Today    Flowsheet Row Most Recent Value  SDOH Interventions   Transportation Interventions Intervention Not Indicated  Physical Activity Interventions Intervention Not Indicated        Care Coordination Interventions:  Yes, provided  Interventions Today    Flowsheet Row Most Recent Value  Chronic Disease   Chronic disease during today's visit Hypertension (HTN), Other  [Arthritis]  General Interventions   General Interventions Discussed/Reviewed General Interventions Discussed, General Interventions Reviewed, Durable Medical Equipment (DME)  [BP was elevated last week at 148/90. Checked during visit today and was 83/60. 2nd check was 108/73 HR 55. HR averages around 65 on metoprolol. Taking lasix daily PRN lower extremity edema. Has taken it more recently due to swelling lower legs.]  Durable Medical Equipment (DME) BP Cuff  Exercise Interventions   Exercise Discussed/Reviewed Exercise Discussed, Exercise Reviewed, Physical Activity  Physical Activity Discussed/Reviewed Physical Activity Discussed, Physical Activity Reviewed, Gym  [exercising at the gym. Ortho recommended building up quad strength to support recent knee replacement. Physical therapy has ended.]  Education Interventions   Education Provided Provided Education  Provided Verbal Education On Exercise, When to see the doctor, Other, Insurance Plans  [facility] approval pending. Use Rx savings card & ask cardio for samples of Xarelto if needed. Recommended to consider personal trainer at gym if not too expensive. Follow-up with Ortho with new or worsening symptoms. Prop feet up when sitting.]  Nutrition Interventions  Nutrition Discussed/Reviewed Nutrition Discussed, Nutrition Reviewed  Pharmacy Interventions   Pharmacy Dicussed/Reviewed Pharmacy Topics Discussed, Medications and their functions, Pharmacy Topics Reviewed  [Taking  spirinolactone daily & lasix daily PRN LE edema. Taking in PM. Advised take in AM so as not to disrupt sleep with urination. Has noticed inc in urination w/ more regular lasix usage. Will hold tonight since swelling is minimal and BP is low.]       Follow up plan: Follow up call scheduled for 07/22/22    Encounter Outcome:  Pt. Visit Completed   Demetrios Loll, BSN, RN-BC RN Care Coordinator [facility]  [facility] Direct Dial: [phone] Main #: [phone]

## 2022-07-22 ENCOUNTER — Ambulatory Visit: Payer: Self-pay | Admitting: *Deleted

## 2022-07-22 NOTE — Patient Outreach (Signed)
  Care Coordination   07/22/2022 Name: Becky [name] MRN: [id number] DOB: 1960-08-05   Care Coordination Outreach Attempts:  An unsuccessful telephone outreach was attempted for a scheduled appointment today.  Follow Up Plan:  Additional outreach attempts will be made to offer the patient care coordination information and services.   Encounter Outcome:  No Answer. Left HIPAA compliant VM.   Care Coordination Interventions:  No, not indicated    Demetrios Loll, BSN, RN-BC RN Care Coordinator [facility]  [facility] Direct Dial: [phone] Main #: [phone]

## 2022-07-30 ENCOUNTER — Ambulatory Visit: Payer: Self-pay | Admitting: *Deleted

## 2022-07-30 ENCOUNTER — Encounter: Payer: Self-pay | Admitting: *Deleted

## 2022-07-30 ENCOUNTER — Encounter: Payer: BC Managed Care – PPO | Admitting: *Deleted

## 2022-07-30 NOTE — Patient Instructions (Signed)
Visit Information  Thank you for taking time to visit with me today. Please don't hesitate to contact me if I can be of assistance to you.   Following are the goals we discussed today:   Goals Addressed               This Visit's Progress     Receive Assistance Applying for Medicaid. (pt-stated)   On track     Care Coordination Interventions:  Interventions Today    Flowsheet Row Most Recent Value  Chronic Disease   Chronic disease during today's visit Diabetes, Hypertension (HTN), Atrial Fibrillation (AFib), Other  General Interventions   General Interventions Discussed/Reviewed General Interventions Discussed, Labs, General Interventions Reviewed, Annual Eye Exam, Durable Medical Equipment (DME), Annual Foot Exam, Community Resources, Level of Care, Communication with, Doctor Visits, Vaccines, Health Screening  [Encouraged]  Labs Hgb A1c every 3 months  [Encouraged]  Vaccines COVID-19, Flu, Pneumonia, RSV, Shingles, Tetanus/Pertussis/Diphtheria  [Encouraged]  Doctor Visits Discussed/Reviewed Doctor Visits Discussed, Specialist, Doctor Visits Reviewed, Annual Wellness Visits, PCP  [Encouraged]  Health Screening Bone Density, Colonoscopy, Mammogram  [Encouraged]  Durable Medical Equipment (DME) Walker, BP Cuff, Glucomoter  [Encouraged]  PCP/Specialist Visits Compliance with follow-up visit  [Encouraged]  Communication with PCP/Specialists, RN  [Encouraged]  Level of Care Applications  [Encouraged]  Applications Medicaid, Personal Care Services  [Encouraged]  Exercise Interventions   Exercise Discussed/Reviewed Exercise Discussed, Assistive device use and maintanence, Exercise Reviewed, Physical Activity, Weight Managment  [Encouraged]  Physical Activity Discussed/Reviewed Physical Activity Discussed, Home Exercise Program (HEP), Physical Activity Reviewed, Types of exercise  [Encouraged]  Weight Management Weight loss  [Encouraged]  Education Interventions   Education Provided  Provided Therapist, sports, Provided Web-based Education, Provided Education  [Encouraged]  Provided Engineer, petroleum On Development worker, community, [employer], When to see the doctor, Mental Health/Coping with Illness, Nutrition, Foot Care, Eye Care, Blood Sugar Monitoring, Applications, Exercise, Medication  [Encouraged]  Applications Medicaid, Personal Care Services  [Encouraged]  Mental Health Interventions   Mental Health Discussed/Reviewed Other, Crisis, Suicide, Coping Strategies, Substance Abuse, Grief and Loss, Mental Health Reviewed, Depression, Anxiety, Mental Health Discussed  [Domestic Violence]  Nutrition Interventions   Nutrition Discussed/Reviewed Nutrition Discussed, Nutrition Reviewed, Adding fruits and vegetables, Increaing proteins, Decreasing fats, Decreasing salt, Decreasing sugar intake, Portion sizes, Carbohydrate meal planning, Fluid intake  [Encouraged]  Pharmacy Interventions   Pharmacy Dicussed/Reviewed Pharmacy Topics Discussed, Pharmacy Topics Reviewed, Medication Adherence, Affording Medications  [Encouraged]  Safety Interventions   Safety Discussed/Reviewed Safety Discussed, Safety Reviewed, Fall Risk, Home Safety  [Encouraged]  Home Safety Assistive Devices, Need for home safety assessment, Refer for community resources  [Encouraged]  Advanced Directive Interventions   Advanced Directives Discussed/Reviewed Advanced Directives Discussed  [Encouraged]       Active Listening & Reflection Utilized.  Verbalization of Feelings Encouraged.  Emotional Support Provided. Feelings of Hopefulness & Relief Validated. Diminished Symptoms of Anxiety & Stress Acknowledged. Problem Solving Interventions Activated. Task-Centered Solutions Employed.   Solution-Focused Strategies Implemented. Acceptance & Commitment Therapy Performed. Cognitive Behavioral Therapy Initiated. [name]. [name] Collaboration with Dr. 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 [name] Collaboration with Receptionist at [employer], Via [employer], to Request Referral for Prep Program at [facility] from [employer], Dr. Elfredia Nevins. Encouraged Attendance at Follow-Up Appointment with Dr. Elfredia Nevins, Primary  Care Provider with [facility] [phone]), Scheduled on 08/28/2022 at 8:45 AM. Encouraged Nightly Use of CPAP Machine, for Overall Health & Wellbeing. Encouraged Administration of Medications, Exactly as Prescribed. Encouraged Increased Level of Activity & Exercise, as Tolerated. Encouraged Implementation of Deep Breathing Exercises, Relaxation Techniques, & Mindfulness Meditation Strategies Daily. Encouraged Contact with [name], Medicaid Case Worker with The [facility] Department of Social Services [phone]), to Periodically Check Status of Medicaid Application. Encouraged Submission of Low-Income Energy Assistance Program Application to The [facility] Department of Health & [employer] [phone]) for Processing, in An Effort to [employer] Assistance with Utilities. Encouraged Submission of [place] Financial Assistance Application to The [facility] Office [phone]) for Processing, in An Effort to Obtain Financial Assistance with Medical Expenses. Encouraged Contact with [name] (# [phone]), If You Have Questions, Need Assistance, or If Additional Social Work Needs Are Identified Between Now & Our Next Scheduled Follow-Up Outreach Call.      Our next appointment is by telephone on 08/27/2022 at 10:30 am.  Please call the care guide team at [phone] if you need to cancel or reschedule your appointment.   If you are experiencing a Mental Health or Behavioral  Health Crisis or need someone to talk to, please call the Suicide and Crisis Lifeline: 988 call the [place] National Suicide Prevention Lifeline: (289) 862-1256 or TTY: 650-770-8968 TTY 502-620-6572) to talk to a trained counselor call 1-800-273-TALK (toll free, 24 hour hotline) go to [facility] [address] [place] [phone]) call the [facility] Crisis Line: [phone] call 911  Patient verbalizes understanding of instructions and care plan provided today and agrees to view in MyChart. Active MyChart status and patient understanding of how to access instructions and care plan via MyChart confirmed with patient.     Telephone follow up appointment with care management team member scheduled for:  08/27/2022 at 10:30 am.  Danford Bad, BSW, MSW, LCSW  Licensed Clinical Social Worker  [facility]  Mailing [place]. [address], [place], [place] [address] Physical Address[address] [place], [place] [address] Toll Free Main # [phone] Fax # [phone] Cell # [phone] [email or web address]

## 2022-07-30 NOTE — Patient Outreach (Signed)
Care Coordination   Follow Up Visit Note   07/30/2022  Name: Becky [name] MRN: [id number] DOB: 10/31/1960  Becky [name] is a 62 y.o. year old female who sees Becky Nevins, MD for primary care. I spoke with Becky [name] by phone today.  What matters to the patients health and wellness today?  Receive Assistance Applying for Medicaid.   Goals Addressed               This Visit's Progress     Receive Assistance Applying for Medicaid. (pt-stated)   On track     Care Coordination Interventions:  Interventions Today    Flowsheet Row Most Recent Value  Chronic Disease   Chronic disease during today's visit Diabetes, Hypertension (HTN), Atrial Fibrillation (AFib), Other  General Interventions   General Interventions Discussed/Reviewed General Interventions Discussed, Labs, General Interventions Reviewed, Annual Eye Exam, Durable Medical Equipment (DME), Annual Foot Exam, Community Resources, Level of Care, Communication with, Doctor Visits, Vaccines, Health Screening  [Encouraged]  Labs Hgb A1c every 3 months  [Encouraged]  Vaccines COVID-19, Flu, Pneumonia, RSV, Shingles, Tetanus/Pertussis/Diphtheria  [Encouraged]  Doctor Visits Discussed/Reviewed Doctor Visits Discussed, Specialist, Doctor Visits Reviewed, Annual Wellness Visits, PCP  [Encouraged]  Health Screening Bone Density, Colonoscopy, Mammogram  [Encouraged]  Durable Medical Equipment (DME) Walker, BP Cuff, Glucomoter  [Encouraged]  PCP/Specialist Visits Compliance with follow-up visit  [Encouraged]  Communication with PCP/Specialists, RN  [Encouraged]  Level of Care Applications  [Encouraged]  Applications Medicaid, Personal Care Services  [Encouraged]  Exercise Interventions   Exercise Discussed/Reviewed Exercise Discussed, Assistive device use and maintanence, Exercise Reviewed, Physical Activity, Weight Managment  [Encouraged]  Physical Activity Discussed/Reviewed Physical Activity Discussed, Home  Exercise Program (HEP), Physical Activity Reviewed, Types of exercise  [Encouraged]  Weight Management Weight loss  [Encouraged]  Education Interventions   Education Provided Provided Therapist, sports, Provided Web-based Education, Provided Education  [Encouraged]  Provided Engineer, petroleum On Development worker, community, [employer], When to see the doctor, Mental Health/Coping with Illness, Nutrition, Foot Care, Eye Care, Blood Sugar Monitoring, Applications, Exercise, Medication  [Encouraged]  Applications Medicaid, Personal Care Services  [Encouraged]  Mental Health Interventions   Mental Health Discussed/Reviewed Other, Crisis, Suicide, Coping Strategies, Substance Abuse, Grief and Loss, Mental Health Reviewed, Depression, Anxiety, Mental Health Discussed  [Domestic Violence]  Nutrition Interventions   Nutrition Discussed/Reviewed Nutrition Discussed, Nutrition Reviewed, Adding fruits and vegetables, Increaing proteins, Decreasing fats, Decreasing salt, Decreasing sugar intake, Portion sizes, Carbohydrate meal planning, Fluid intake  [Encouraged]  Pharmacy Interventions   Pharmacy Dicussed/Reviewed Pharmacy Topics Discussed, Pharmacy Topics Reviewed, Medication Adherence, Affording Medications  [Encouraged]  Safety Interventions   Safety Discussed/Reviewed Safety Discussed, Safety Reviewed, Fall Risk, Home Safety  [Encouraged]  Home Safety Assistive Devices, Need for home safety assessment, Refer for community resources  [Encouraged]  Advanced Directive Interventions   Advanced Directives Discussed/Reviewed Advanced Directives Discussed  [Encouraged]       Active Listening & Reflection Utilized.  Verbalization of Feelings Encouraged.  Emotional Support Provided. Feelings of Hopefulness & Relief Validated. Diminished Symptoms of Anxiety & Stress Acknowledged. Problem Solving Interventions Activated. Task-Centered Solutions Employed.   Solution-Focused Strategies Implemented. Acceptance &  Commitment Therapy Performed. Cognitive Behavioral Therapy Initiated. [name]. [name] Collaboration with Dr. Elfredia [name], Primary Care Provider with [facility] [phone]), Via Routed Note in Epic, to Request Referral for Prep Program at [facility] 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 [name] Secretary/administrator with Receptionist at [employer], Via [employer],  to Request Referral for Prep Program at [facility] from Primary Care Provider, Dr. Elfredia [name]. Encouraged Attendance at Follow-Up Appointment with Dr. Elfredia [name], Primary Care Provider with [facility] [phone]), Scheduled on 08/28/2022 at 8:45 AM. Encouraged Nightly Use of CPAP Machine, for Overall Health & Wellbeing. Encouraged Administration of Medications, Exactly as Prescribed. Encouraged Increased Level of Activity & Exercise, as Tolerated. Encouraged Implementation of Deep Breathing Exercises, Relaxation Techniques, & Mindfulness Meditation Strategies Daily. Encouraged Contact with Becky [name], Medicaid Case Worker with [facility] Department of Social Services [phone]), to Periodically Check Status of Medicaid Application. Encouraged Submission of Low-Income Energy Assistance Program Application to [facility] Department of Health & [employer] [phone]) for Processing, in An Effort to [employer] Assistance with Utilities. Encouraged Submission of [place] Financial Assistance Application to [facility] Office [phone]) for Processing, in An Effort to Obtain Financial Assistance with Medical Expenses. Encouraged Contact with [name] (# [phone]), If You Have Questions, Need Assistance, or If Additional Social Work Needs Are Identified Between Now & Our Next Scheduled Follow-Up Outreach Call.      SDOH assessments and interventions completed:   Yes.  Care Coordination Interventions:  Yes, provided.   Follow up plan: Follow up call scheduled for 08/27/2022 at 10:30 am.   Encounter Outcome:  Pt. Visit Completed.   Danford Bad, BSW, MSW, LCSW  Licensed Restaurant manager, [employer] [facility]  Mailing [place] [address], [place], [place] [address] Physical Address-[address] [place], [place] [address] Toll Free Main # [phone] Fax # [phone] Cell # [phone] [email or web address]

## 2022-07-31 ENCOUNTER — Encounter: Payer: BC Managed Care – PPO | Attending: Family Medicine | Admitting: Nutrition

## 2022-07-31 VITALS — Ht 67.0 in | Wt 265.0 lb

## 2022-07-31 DIAGNOSIS — I2510 Atherosclerotic heart disease of native coronary artery without angina pectoris: Secondary | ICD-10-CM | POA: Diagnosis not present

## 2022-07-31 DIAGNOSIS — E782 Mixed hyperlipidemia: Secondary | ICD-10-CM | POA: Insufficient documentation

## 2022-07-31 DIAGNOSIS — E118 Type 2 diabetes mellitus with unspecified complications: Secondary | ICD-10-CM | POA: Diagnosis not present

## 2022-07-31 DIAGNOSIS — I10 Essential (primary) hypertension: Secondary | ICD-10-CM | POA: Diagnosis not present

## 2022-07-31 DIAGNOSIS — I2583 Coronary atherosclerosis due to lipid rich plaque: Secondary | ICD-10-CM | POA: Diagnosis not present

## 2022-07-31 NOTE — Progress Notes (Signed)
Medical Nutrition Therapy  Appointment Start time:  1530  Appointment End time:  1630  Primary concerns today: DM Type 2, Obesity  Referral diagnosis: E11.8, E66.01 Preferred learning style: No preference  Learning readiness: Ready   NUTRITION ASSESSMENT  62 yr old bfemale referred for Type 2 DM and Obesity. BMI 41. Diet controlled Type 2 . Hyperlipidemia.  She is wanting to reverse her DM, lose weight and get off of a lot of her medications. She is at high risk for a cardiac event with her co morbidities.  Is not testing her blood sugars. Needs to get a meter and testing supplies. PCP Dr. Sherwood Gambler office. She is willing to commit to Lifestyle Medicine to focus on more of a whole plant based diet and healthier lifestyle.  Anthropometrics  Wt Readings from Last 3 Encounters:  06/05/22 258 lb (117 kg)  05/08/22 256 lb (116.1 kg)  04/07/22 256 lb (116.1 kg)   Ht Readings from Last 3 Encounters:  06/05/22 5\' 7"  (1.702 m)  05/08/22 5\' 7"  (1.702 m)  04/07/22 5\' 7"  (1.702 m)   There is no height or weight on file to calculate BMI. @BMIFA @ Facility age limit for growth %iles is 20 years. Facility age limit for growth %iles is 20 years.    Clinical Medical Hx: HTN, Afib, CAD, DM Type 2, GERD,Hypothyroidism, Osteoarthritis. Hyperlipidemia. Medications: See chart Labs:     Latest Ref Rng & Units 01/30/2022    2:39 AM 01/27/2022    9:42 AM 11/09/2021    4:15 AM  CMP  Glucose 70 - 99 mg/dL 409  811  914   BUN 6 - 20 mg/dL 12  15  15    Creatinine 0.44 - 1.00 mg/dL 7.82  9.56  2.13   Sodium 135 - 145 mmol/L 134  140  134   Potassium 3.5 - 5.1 mmol/L 3.9  4.4  4.0   Chloride 98 - 111 mmol/L 104  108  106   CO2 22 - 32 mmol/L 25  26  22    Calcium 8.9 - 10.3 mg/dL 8.4  9.3  9.1    Lipid Panel     Component Value Date/Time   CHOL 253 (H) 09/13/2021 0925   CHOL 231 (H) 01/01/2017 1139   TRIG 93 09/13/2021 0925   HDL 60 09/13/2021 0925   HDL 57 01/01/2017 1139   CHOLHDL 4.2  09/13/2021 0925   VLDL 19 09/13/2021 0925   LDLCALC 174 (H) 09/13/2021 0925   LDLCALC 154 (H) 01/01/2017 1139   LABVLDL 20 01/01/2017 1139    Notable Signs/Symptoms: None  Lifestyle & Dietary Hx Lives with her husband. She cooks some and they eat out or get take out often.  Estimated daily fluid intake: 30 oz Supplements:  Sleep: poor Stress / self-care:  Current average weekly physical activity: ADL  Painful to walk and exercise due to knee issues.  24-Hr Dietary Recall Eats 2-3 meals per day   Estimated Energy Needs Calories: 1200 Carbohydrate: 135g Protein: 90g Fat: 33g   NUTRITION DIAGNOSIS  NI-1.7 Predicted excessive energy intake As related to Morbid Obesity and Type 2 DM.  As evidenced by A1C > 6.5% and BMI 41.Marland Kitchen   NUTRITION INTERVENTION  Nutrition education (E-1) on the following topics:  Nutrition and Diabetes education provided on My Plate, CHO counting, meal planning, portion sizes, timing of meals, avoiding snacks between meals unless having a low blood sugar, target ranges for A1C and blood sugars, signs/symptoms and treatment of hyper/hypoglycemia, monitoring  blood sugars, taking medications as prescribed, benefits of exercising 30 minutes per day and prevention of complications of DM.  Lifestyle Medicine  - Whole Food, Plant Predominant Nutrition is highly recommended: Eat Plenty of vegetables, Mushrooms, fruits, Legumes, Whole Grains, Nuts, seeds in lieu of processed meats, processed snacks/pastries red meat, poultry, eggs.    -It is better to avoid simple carbohydrates including: Cakes, Sweet Desserts, Ice Cream, Soda (diet and regular), Sweet Tea, Candies, Chips, Cookies, Store Bought Juices, Alcohol in Excess of  1-2 drinks a day, Lemonade,  Artificial Sweeteners, Doughnuts, Coffee Creamers, "Sugar-free" Products, etc, etc.  This is not a complete list.....  Exercise: If you are able: 30 -60 minutes a day ,4 days a week, or 150 minutes a week.  The longer  the better.  Combine stretch, strength, and aerobic activities.  If you were told in the past that you have high risk for cardiovascular diseases, you may seek evaluation by your heart doctor prior to initiating moderate to intense exercise programs.   Handouts Provided Include  LIfestyle Medicine Diabetes Handouts.  Learning Style & Readiness for Change Teaching method utilized: Visual & Auditory  Demonstrated degree of understanding via: Teach Back  Barriers to learning/adherence to lifestyle change: none  Goals Established by Pt Goals Focus on eating more foods from a garden. Eat three meals per day Eat meals on time. Cut out snack between meals and late at night Get 8 hrs of sleep. Talk to MD about a sleep study to rule out sleep apnea. Focus on more whole plant based foods and cut out fast foods and processed junk food. Lose 1 lb per week Drink only wat   MONITORING & EVALUATION Dietary intake, weekly physical activity, and blood sugars and weight  in 1 month.  Next Steps  Patient is to work on  eating better balanced meals.Marland Kitchen

## 2022-08-04 ENCOUNTER — Encounter: Payer: Self-pay | Admitting: Nutrition

## 2022-08-04 NOTE — Patient Instructions (Signed)
Goals Eat three meals per day Eat meals on time. Cut out snack between meals and late at night Get 8 hrs of sleep. Talk to MD about a sleep study to rule out sleep apnea. Focus on more whole plant based foods and cut out fast foods and processed junk food. Lose 1 lb per week Drink only water

## 2022-08-05 ENCOUNTER — Telehealth: Payer: Self-pay | Admitting: *Deleted

## 2022-08-05 NOTE — Progress Notes (Unsigned)
  Care Coordination Note  08/05/2022 Name: Ilamae Kishbaugh MRN: [id number] DOB: [birth year]  Josceline Galluccio is a 62 y.o. year old female who is a primary care patient of Elfredia Nevins, MD and is actively engaged with the care management team. I reached out to [name] by phone today to assist with re-scheduling a follow up visit with the RN Case Manager  Follow up plan: Unsuccessful telephone outreach attempt made. A HIPAA compliant phone message was left for the patient providing contact information and requesting a return call.   Burman Nieves, CCMA Care Coordination Care Guide Direct Dial: [phone]

## 2022-08-07 NOTE — Progress Notes (Signed)
  Care Coordination Note  08/07/2022 Name: Becky [name] MRN: [id number] DOB: 06-23-1960  Becky [name] is a 62 y.o. year old female who is a primary care patient of Elfredia Nevins, MD and is actively engaged with the care management team. I reached out to [name] by phone today to assist with re-scheduling a follow up visit with the RN Case Manager  Follow up plan: Telephone appointment with care management team member scheduled for: 08/25/2022  Burman Nieves, [facility] Care Coordination Care Guide Direct Dial: [phone]

## 2022-08-11 ENCOUNTER — Other Ambulatory Visit: Payer: Self-pay | Admitting: Cardiology

## 2022-08-25 ENCOUNTER — Encounter: Payer: Self-pay | Admitting: *Deleted

## 2022-08-25 ENCOUNTER — Ambulatory Visit: Payer: Self-pay | Admitting: *Deleted

## 2022-08-25 DIAGNOSIS — M7541 Impingement syndrome of right shoulder: Secondary | ICD-10-CM | POA: Diagnosis not present
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
## 2022-08-26 ENCOUNTER — Telehealth: Payer: Self-pay | Admitting: *Deleted

## 2022-08-26 NOTE — Telephone Encounter (Signed)
   Pre-operative Risk Assessment    Patient Name: Becky [name]  DOB: Feb 20, 1960 MRN: [id number]    DATE OF LAST VISIT: 03/20/22 WITH DR. BRANCH DATE OF NEXT VISIT: 09/24/22 WITH DR. BRANCH FOR 6 MONTH F/U  Request for Surgical Clearance    Procedure:   RIGHT SHOULDER SCOPE W/RCR  Date of Surgery:  Clearance 09/12/22                                 Surgeon:  DR. KVIN SUPPLE Surgeon's Group or Practice Name:  [facility] Phone number:  [phone] Fax number:  [phone] ATTN: [name]   Type of Clearance Requested:   - Medical  - Pharmacy:  Hold Rivaroxaban (Xarelto)     Type of Anesthesia:  General    Additional requests/questions:    [name]   08/26/2022, 10:24 AM

## 2022-08-26 NOTE — Telephone Encounter (Signed)
CORRECT SPELLING OF SURGEON'S NAME IS DR. Caryn Bee SUPPLE.

## 2022-08-27 ENCOUNTER — Telehealth: Payer: Self-pay

## 2022-08-27 ENCOUNTER — Encounter: Payer: Self-pay | Admitting: *Deleted

## 2022-08-27 ENCOUNTER — Ambulatory Visit: Payer: Self-pay | Admitting: *Deleted
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
## 2022-08-27 NOTE — Telephone Encounter (Signed)
Patient with diagnosis of afib on Xarelto for anticoagulation.    Procedure: right shoulder scope with RCR Date of procedure: 09/12/22  CHA2DS2-VASc Score = 4  This indicates a 4.8% annual risk of stroke. The patient's score is based upon: CHF History: 0 HTN History: 1 Diabetes History: 1 Stroke History: 0 Vascular Disease History: 1 Age Score: 0 Gender Score: 1   CrCl 78mL/min using adjusted body weight Platelet count 224K  Per office protocol, patient can hold Xarelto for 2-3 days prior to procedure.    **This guidance is not considered finalized until pre-operative APP has relayed final recommendations.**

## 2022-08-27 NOTE — Telephone Encounter (Signed)
  Patient Consent for Virtual Visit         Becky [name] has provided verbal consent on 08/27/2022 for a virtual visit (video or telephone).   CONSENT FOR VIRTUAL VISIT FOR:  Becky [name]  By participating in this virtual visit I agree to the following:  I hereby voluntarily request, consent and authorize [facility] and its employed or contracted physicians, physician assistants, nurse practitioners or other licensed health care professionals (the Practitioner), to provide me with telemedicine health care services (the "Services") as deemed necessary by the treating Practitioner. I acknowledge and consent to receive the Services by the Practitioner via telemedicine. I understand that the telemedicine visit will involve communicating with the Practitioner through live audiovisual communication technology and the disclosure of certain medical information by electronic transmission. I acknowledge that I have been given the opportunity to request an in-person assessment or other available alternative prior to the telemedicine visit and am voluntarily participating in the telemedicine visit.  I understand that I have the right to withhold or withdraw my consent to the use of telemedicine in the course of my care at any time, without affecting my right to future care or treatment, and that the Practitioner or I may terminate the telemedicine visit at any time. I understand that I have the right to inspect all information obtained and/or recorded in the course of the telemedicine visit and may receive copies of available information for a reasonable fee.  I understand that some of the potential risks of receiving the Services via telemedicine include:  Delay or interruption in medical evaluation due to technological equipment failure or disruption; Information transmitted may not be sufficient (e.g. poor resolution of images) to allow for appropriate medical decision making by the  Practitioner; and/or  In rare instances, security protocols could fail, causing a breach of personal health information.  Furthermore, I acknowledge that it is my responsibility to provide information about my medical history, conditions and care that is complete and accurate to the best of my ability. I acknowledge that Practitioner's advice, recommendations, and/or decision may be based on factors not within their control, such as incomplete or inaccurate data provided by me or distortions of diagnostic images or specimens that may result from electronic transmissions. I understand that the practice of medicine is not an exact science and that Practitioner makes no warranties or guarantees regarding treatment outcomes. I acknowledge that a copy of this consent can be made available to me via my patient portal [facility] MyChart), or I can request a printed copy by calling the office of [facility].    I understand that my insurance will be billed for this visit.   I have read or had this consent read to me. I understand the contents of this consent, which adequately explains the benefits and risks of the Services being provided via telemedicine.  I have been provided ample opportunity to ask questions regarding this consent and the Services and have had my questions answered to my satisfaction. I give my informed consent for the services to be provided through the use of telemedicine in my medical care

## 2022-08-27 NOTE — Telephone Encounter (Signed)
Spoke with the patient who is agreeable with telehealth appointment.   Med list reviewed and consent given.

## 2022-08-27 NOTE — Telephone Encounter (Signed)
   Name: Becky [name]  DOB: 07/31/1960  MRN: [id number]  Primary Cardiologist: Dina Rich, MD   Preoperative team, please contact this patient and set up a phone call appointment for further preoperative risk assessment. Please obtain consent and complete medication review. Thank you for your help.  I confirm that guidance regarding antiplatelet and oral anticoagulation therapy has been completed and, if necessary, noted below.  er office protocol, patient can hold Xarelto for 2-3 days prior to procedure.     Napoleon Form, Leodis Rains, NP 08/27/2022, 2:43 PM [facility]

## 2022-08-28 ENCOUNTER — Other Ambulatory Visit (HOSPITAL_COMMUNITY)
Admission: RE | Admit: 2022-08-28 | Discharge: 2022-08-28 | Disposition: A | Discharge status: Home / Self Care | Payer: BC Managed Care – PPO | Source: Ambulatory Visit | Attending: Internal Medicine | Admitting: Internal Medicine

## 2022-08-28 DIAGNOSIS — E6609 Other obesity due to excess calories: Secondary | ICD-10-CM | POA: Diagnosis not present

## 2022-08-28 DIAGNOSIS — I872 Venous insufficiency (chronic) (peripheral): Secondary | ICD-10-CM | POA: Diagnosis not present

## 2022-08-28 DIAGNOSIS — I700 Atherosclerosis of aorta: Secondary | ICD-10-CM | POA: Diagnosis not present

## 2022-08-28 DIAGNOSIS — Z6839 Body mass index (BMI) 39.0-39.9, adult: Secondary | ICD-10-CM | POA: Diagnosis not present

## 2022-08-28 DIAGNOSIS — Z1331 Encounter for screening for depression: Secondary | ICD-10-CM | POA: Diagnosis not present

## 2022-08-28 DIAGNOSIS — I4891 Unspecified atrial fibrillation: Secondary | ICD-10-CM | POA: Diagnosis not present

## 2022-08-28 DIAGNOSIS — I10 Essential (primary) hypertension: Secondary | ICD-10-CM | POA: Diagnosis not present

## 2022-08-28 DIAGNOSIS — R5383 Other fatigue: Secondary | ICD-10-CM | POA: Diagnosis not present

## 2022-08-28 DIAGNOSIS — Z0001 Encounter for general adult medical examination with abnormal findings: Secondary | ICD-10-CM | POA: Diagnosis not present

## 2022-08-28 DIAGNOSIS — T50905A Adverse effect of unspecified drugs, medicaments and biological substances, initial encounter: Secondary | ICD-10-CM | POA: Diagnosis not present

## 2022-08-28 DIAGNOSIS — E1165 Type 2 diabetes mellitus with hyperglycemia: Secondary | ICD-10-CM | POA: Diagnosis not present

## 2022-08-28 DIAGNOSIS — M12811 Other specific arthropathies, not elsewhere classified, right shoulder: Secondary | ICD-10-CM | POA: Diagnosis not present

## 2022-08-28 DIAGNOSIS — M159 Polyosteoarthritis, unspecified: Secondary | ICD-10-CM | POA: Diagnosis not present

## 2022-08-28 LAB — LIPID PANEL
Cholesterol: 162 mg/dL (ref 0–200)
HDL: 62 mg/dL (ref >40)
LDL Cholesterol: 84 mg/dL (ref 0–99)
Total CHOL/HDL Ratio: 2.6 RATIO
Triglycerides: 81 mg/dL (ref <150)
VLDL: 16 mg/dL (ref 0–40)

## 2022-08-28 LAB — COMPREHENSIVE METABOLIC PANEL
ALT: 19 U/L (ref 0–44)
AST: 17 U/L (ref 15–41)
Albumin: 4.1 g/dL (ref 3.5–5.0)
Alkaline Phosphatase: 63 U/L (ref 38–126)
Anion gap: 8 (ref 5–15)
BUN: 16 mg/dL (ref 8–23)
CO2: 23 mmol/L (ref 22–32)
Calcium: 9.1 mg/dL (ref 8.9–10.3)
Chloride: 106 mmol/L (ref 98–111)
Creatinine, Ser: 1.08 mg/dL — ABNORMAL HIGH (ref 0.44–1.00)
GFR, Estimated: 58 mL/min — ABNORMAL LOW (ref >60)
Glucose, Bld: 117 mg/dL — ABNORMAL HIGH (ref 70–99)
Potassium: 3.8 mmol/L (ref 3.5–5.1)
Sodium: 137 mmol/L (ref 135–145)
Total Bilirubin: 0.8 mg/dL (ref 0.3–1.2)
Total Protein: 6.6 g/dL (ref 6.5–8.1)

## 2022-08-28 LAB — CBC WITH DIFFERENTIAL/PLATELET
Abs Immature Granulocytes: 0.02 10*3/uL (ref 0.00–0.07)
Basophils Absolute: 0 10*3/uL (ref 0.0–0.1)
Basophils Relative: 0 %
Eosinophils Absolute: 0.2 10*3/uL (ref 0.0–0.5)
Eosinophils Relative: 2 %
HCT: 34.6 % — ABNORMAL LOW (ref 36.0–46.0)
Hemoglobin: 11.3 g/dL — ABNORMAL LOW (ref 12.0–15.0)
Immature Granulocytes: 0 %
Lymphocytes Relative: 39 %
Lymphs Abs: 2.9 10*3/uL (ref 0.7–4.0)
MCH: 31.2 pg (ref 26.0–34.0)
MCHC: 32.7 g/dL (ref 30.0–36.0)
MCV: 95.6 fL (ref 80.0–100.0)
Monocytes Absolute: 0.7 10*3/uL (ref 0.1–1.0)
Monocytes Relative: 9 %
Neutro Abs: 3.6 10*3/uL (ref 1.7–7.7)
Neutrophils Relative %: 50 %
Platelets: 277 10*3/uL (ref 150–400)
RBC: 3.62 MIL/uL — ABNORMAL LOW (ref 3.87–5.11)
RDW: 13.2 % (ref 11.5–15.5)
WBC: 7.3 10*3/uL (ref 4.0–10.5)
nRBC: 0 % (ref 0.0–0.2)

## 2022-08-28 LAB — VITAMIN B12: Vitamin B-12: 261 pg/mL (ref 180–914)

## 2022-08-28 LAB — TSH: TSH: 7.004 u[IU]/mL — ABNORMAL HIGH (ref 0.350–4.500)

## 2022-08-28 LAB — HEMOGLOBIN A1C
Hgb A1c MFr Bld: 7 % — ABNORMAL HIGH (ref 4.8–5.6)
Mean Plasma Glucose: 154.2 mg/dL

## 2022-08-28 LAB — VITAMIN D 25 HYDROXY (VIT D DEFICIENCY, FRACTURES): Vit D, 25-Hydroxy: 41.55 ng/mL (ref 30–100)

## 2022-09-04 ENCOUNTER — Ambulatory Visit: Payer: BC Managed Care – PPO | Attending: Cardiology | Admitting: Nurse Practitioner

## 2022-09-04 DIAGNOSIS — Z01810 Encounter for preprocedural cardiovascular examination: Secondary | ICD-10-CM
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
## 2022-09-06 DIAGNOSIS — G4733 Obstructive sleep apnea (adult) (pediatric): Secondary | ICD-10-CM | POA: Diagnosis not present

## 2022-09-10 ENCOUNTER — Telehealth: Payer: Self-pay | Admitting: *Deleted

## 2022-09-10 NOTE — Telephone Encounter (Signed)
Contacted to schedule intake assessment appointment for PREP class. Bree informed me she has to have shoulder surgery and will need to postpone class at this time. We will reach back out once she recovers and is able to exercise.

## 2022-09-12 DIAGNOSIS — S43431A Superior glenoid labrum lesion of right shoulder, initial encounter: Secondary | ICD-10-CM | POA: Diagnosis not present

## 2022-09-12 DIAGNOSIS — M94211 Chondromalacia, right shoulder: Secondary | ICD-10-CM | POA: Diagnosis not present

## 2022-09-12 DIAGNOSIS — M24611 Ankylosis, right shoulder: Secondary | ICD-10-CM | POA: Diagnosis not present

## 2022-09-12 DIAGNOSIS — M7521 Bicipital tendinitis, right shoulder: Secondary | ICD-10-CM | POA: Diagnosis not present

## 2022-09-12 DIAGNOSIS — M948X1 Other specified disorders of cartilage, shoulder: Secondary | ICD-10-CM | POA: Diagnosis not present

## 2022-09-12 DIAGNOSIS — M7551 Bursitis of right shoulder: Secondary | ICD-10-CM | POA: Diagnosis not present

## 2022-09-12 DIAGNOSIS — M7541 Impingement syndrome of right shoulder: Secondary | ICD-10-CM | POA: Diagnosis not present

## 2022-09-12 DIAGNOSIS — M75111 Incomplete rotator cuff tear or rupture of right shoulder, not specified as traumatic: Secondary | ICD-10-CM | POA: Diagnosis not present

## 2022-09-12 DIAGNOSIS — M65811 Other synovitis and tenosynovitis, right shoulder: Secondary | ICD-10-CM | POA: Diagnosis not present

## 2022-09-12 DIAGNOSIS — M19011 Primary osteoarthritis, right shoulder: Secondary | ICD-10-CM | POA: Diagnosis not present

## 2022-09-12 DIAGNOSIS — M24111 Other articular cartilage disorders, right shoulder: Secondary | ICD-10-CM | POA: Diagnosis not present

## 2022-09-12 DIAGNOSIS — M24011 Loose body in right shoulder: Secondary | ICD-10-CM | POA: Diagnosis not present

## 2022-09-12 DIAGNOSIS — M659 Synovitis and tenosynovitis, unspecified: Secondary | ICD-10-CM | POA: Diagnosis not present

## 2022-09-15 ENCOUNTER — Ambulatory Visit: Payer: Self-pay | Admitting: *Deleted

## 2022-09-15 ENCOUNTER — Encounter: Payer: Self-pay | Admitting: *Deleted

## 2022-09-15 NOTE — Patient Instructions (Signed)
Visit Information  Thank you for taking time to visit with me today. Please don't hesitate to contact me if I can be of assistance to you.   Following are the goals we discussed today:   Goals Addressed               This Visit's Progress     COMPLETED: Receive Assistance Applying for Medicaid. (pt-stated)   On track     Care Coordination Interventions:  Interventions Today    Flowsheet Row Most Recent Value  Chronic Disease   Chronic disease during today's visit Diabetes, Hypertension (HTN), Atrial Fibrillation (AFib), Other  General Interventions   General Interventions Discussed/Reviewed General Interventions Discussed, Labs, General Interventions Reviewed, Annual Eye Exam, Durable Medical Equipment (DME), Annual Foot Exam, Community Resources, Level of Care, Communication with, Doctor Visits, Vaccines, Health Screening  [Encouraged]  Labs Hgb A1c every 3 months  [Encouraged]  Vaccines COVID-19, Flu, Pneumonia, RSV, Shingles, Tetanus/Pertussis/Diphtheria  [Encouraged]  Doctor Visits Discussed/Reviewed Doctor Visits Discussed, Specialist, Doctor Visits Reviewed, Annual Wellness Visits, PCP  [Encouraged]  Health Screening Bone Density, Colonoscopy, Mammogram  [Encouraged]  Durable Medical Equipment (DME) Walker, BP Cuff, Glucomoter  [Encouraged]  PCP/Specialist Visits Compliance with follow-up visit  [Encouraged]  Communication with PCP/Specialists, RN  [Encouraged]  Level of Care Applications  [Encouraged]  Applications Medicaid, Personal Care Services  [Encouraged]  Exercise Interventions   Exercise Discussed/Reviewed Exercise Discussed, Assistive device use and maintanence, Exercise Reviewed, Physical Activity, Weight Managment  [Encouraged]  Physical Activity Discussed/Reviewed Physical Activity Discussed, Home Exercise Program (HEP), Physical Activity Reviewed, Types of exercise  [Encouraged]  Weight Management Weight loss  [Encouraged]  Education Interventions   Education  Provided Provided Therapist, sports, Provided Web-based Education, Provided Education  [Encouraged]  Provided Engineer, petroleum On Development worker, community, [employer], When to see the doctor, Mental Health/Coping with Illness, Nutrition, Foot Care, Eye Care, Blood Sugar Monitoring, Applications, Exercise, Medication  [Encouraged]  Applications Medicaid, Personal Care Services  [Encouraged]  Mental Health Interventions   Mental Health Discussed/Reviewed Other, Crisis, Suicide, Coping Strategies, Substance Abuse, Grief and Loss, Mental Health Reviewed, Depression, Anxiety, Mental Health Discussed  [Domestic Violence]  Nutrition Interventions   Nutrition Discussed/Reviewed Nutrition Discussed, Nutrition Reviewed, Adding fruits and vegetables, Increaing proteins, Decreasing fats, Decreasing salt, Decreasing sugar intake, Portion sizes, Carbohydrate meal planning, Fluid intake  [Encouraged]  Pharmacy Interventions   Pharmacy Dicussed/Reviewed Pharmacy Topics Discussed, Pharmacy Topics Reviewed, Medication Adherence, Affording Medications  [Encouraged]  Safety Interventions   Safety Discussed/Reviewed Safety Discussed, Safety Reviewed, Fall Risk, Home Safety  [Encouraged]  Home Safety Assistive Devices, Need for home safety assessment, Refer for community resources  [Encouraged]  Advanced Directive Interventions   Advanced Directives Discussed/Reviewed Advanced Directives Discussed  [Encouraged]       Active Listening & Reflection Utilized.  Verbalization of Feelings Encouraged.  Emotional Support Provided. Problem Solving Interventions Resolved. Task-Centered [employer].   Solution-Focused Strategies Performed. Cognitive Behavioral Therapy Initiated. [name] Collaboration with Dr. Elfredia Nevins, Primary Care Provider with [facility] [phone]), Via Routed Note in Epic, to Report Sudden Onset of Lower Back Pain, Per Your Request. [name] Collaboration with [name],  Medicaid Case Worker with [facility] Department of Social Services [phone]), to Confirm Approval for Full Adult Medicaid Coverage (# [id number] R). Confirmed Disinterest in Applying for Personal Care Services, through [facility] [phone]). Encouraged Attendance at Follow-Up Appointment with [name], Registered Dietician with [facility] Nutrition & Diabetes Education Services at [place] [phone]), Scheduled on  09/30/2022 at 2:00 PM. Encouraged Submission of Low-Income Energy Assistance Program Application to The [facility] Department of Health & [employer] [phone]) for Processing, in An Effort to [employer] Assistance with Utilities. Encouraged Submission of [place] Financial Assistance Application to The [facility] Office [phone]) for Processing, in An Effort to Obtain Financial Assistance with Medical Expenses. Encouraged Contact with [name] (# [phone]), If You Have Questions, Need Assistance, or If Additional Social Work Needs Are Identified in The Near Future.      Please call the care guide team at [phone] if you need to cancel or reschedule your appointment.   If you are experiencing a Mental Health or Behavioral Health Crisis or need someone to talk to, please call the Suicide and Crisis Lifeline: 988 call the [place] National Suicide Prevention Lifeline: (602)419-6444 or TTY: 850-326-6813 TTY (562) 820-1088) to talk to a trained counselor call 1-800-273-TALK (toll free, 24 hour hotline) go to [facility] [address] [place] [phone]) call the [facility] Crisis Line: [phone] call 911  Patient verbalizes understanding of instructions and care plan provided today and agrees to view in MyChart. Active MyChart status and patient understanding of how to access instructions and care plan via MyChart confirmed with patient.     No further  follow up required.  Danford Bad, BSW, MSW, Printmaker Social Work Case Set designer Health  [facility], Population Health Direct Dial: [phone]  Fax: [phone] Email: [email or web address]: [email or web address]

## 2022-09-15 NOTE — Patient Outreach (Signed)
Care Coordination   Follow Up Visit Note   09/15/2022  Name: Becky [name] MRN: [id number] DOB: 11-Dec-1960  Becky [name] is a 62 y.o. year old female who sees Elfredia Nevins, MD for primary care. I spoke with [name] by phone today.  What matters to the patients health and wellness today?  Receive Assistance Applying for Medicaid.   Goals Addressed               This Visit's Progress     COMPLETED: Receive Assistance Applying for Medicaid. (pt-stated)   On track     Care Coordination Interventions:  Interventions Today    Flowsheet Row Most Recent Value  Chronic Disease   Chronic disease during today's visit Diabetes, Hypertension (HTN), Atrial Fibrillation (AFib), Other  General Interventions   General Interventions Discussed/Reviewed General Interventions Discussed, Labs, General Interventions Reviewed, Annual Eye Exam, Durable Medical Equipment (DME), Annual Foot Exam, Community Resources, Level of Care, Communication with, Doctor Visits, Vaccines, Health Screening  [Encouraged]  Labs Hgb A1c every 3 months  [Encouraged]  Vaccines COVID-19, Flu, Pneumonia, RSV, Shingles, Tetanus/Pertussis/Diphtheria  [Encouraged]  Doctor Visits Discussed/Reviewed Doctor Visits Discussed, Specialist, Doctor Visits Reviewed, Annual Wellness Visits, PCP  [Encouraged]  Health Screening Bone Density, Colonoscopy, Mammogram  [Encouraged]  Durable Medical Equipment (DME) Walker, BP Cuff, Glucomoter  [Encouraged]  PCP/Specialist Visits Compliance with follow-up visit  [Encouraged]  Communication with PCP/Specialists, RN  [Encouraged]  Level of Care Applications  [Encouraged]  Applications Medicaid, Personal Care Services  [Encouraged]  Exercise Interventions   Exercise Discussed/Reviewed Exercise Discussed, Assistive device use and maintanence, Exercise Reviewed, Physical Activity, Weight Managment  [Encouraged]  Physical Activity Discussed/Reviewed Physical Activity Discussed,  Home Exercise Program (HEP), Physical Activity Reviewed, Types of exercise  [Encouraged]  Weight Management Weight loss  [Encouraged]  Education Interventions   Education Provided Provided Therapist, sports, Provided Web-based Education, Provided Education  [Encouraged]  Provided Engineer, petroleum On Development worker, community, [employer], When to see the doctor, Mental Health/Coping with Illness, Nutrition, Foot Care, Eye Care, Blood Sugar Monitoring, Applications, Exercise, Medication  [Encouraged]  Applications Medicaid, Personal Care Services  [Encouraged]  Mental Health Interventions   Mental Health Discussed/Reviewed Other, Crisis, Suicide, Coping Strategies, Substance Abuse, Grief and Loss, Mental Health Reviewed, Depression, Anxiety, Mental Health Discussed  [Domestic Violence]  Nutrition Interventions   Nutrition Discussed/Reviewed Nutrition Discussed, Nutrition Reviewed, Adding fruits and vegetables, Increaing proteins, Decreasing fats, Decreasing salt, Decreasing sugar intake, Portion sizes, Carbohydrate meal planning, Fluid intake  [Encouraged]  Pharmacy Interventions   Pharmacy Dicussed/Reviewed Pharmacy Topics Discussed, Pharmacy Topics Reviewed, Medication Adherence, Affording Medications  [Encouraged]  Safety Interventions   Safety Discussed/Reviewed Safety Discussed, Safety Reviewed, Fall Risk, Home Safety  [Encouraged]  Home Safety Assistive Devices, Need for home safety assessment, Refer for community resources  [Encouraged]  Advanced Directive Interventions   Advanced Directives Discussed/Reviewed Advanced Directives Discussed  [Encouraged]       Active Listening & Reflection Utilized.  Verbalization of Feelings Encouraged.  Emotional Support Provided. Problem Solving Interventions Resolved. Task-Centered [employer].   Solution-Focused Strategies Performed. Cognitive Behavioral Therapy Initiated. [name] Collaboration with Dr. Elfredia Nevins, Primary Care Provider  with [facility] [phone]), Via Routed Note in Epic, to Report Sudden Onset of Lower Back Pain, Per Your Request. [name] Collaboration with [name], Medicaid Case Worker with [facility] Department of Social Services [phone]), to Confirm Approval for Full Adult Medicaid Coverage (# [id number] R). Confirmed Disinterest in Applying for Personal Care Services, through [facility] (#  [phone]). Encouraged Attendance at Follow-Up Appointment with [name], Registered Dietician with [facility] Nutrition & Diabetes Education Services at [place] [phone]), Scheduled on 09/30/2022 at 2:00 PM. Encouraged Submission of Low-Income Energy Assistance Program Application to [facility] Department of Health & [employer] [phone]) for Processing, in An Effort to Obtain Financial Assistance with Utilities. Encouraged Submission of [place] Financial Assistance Application to The [facility] Office [phone]) for Processing, in An Effort to Obtain Financial Assistance with Medical Expenses. Encouraged Contact with [name] (# [phone]), If You Have Questions, Need Assistance, or If Additional Social Work Needs Are Identified in The Near Future.      SDOH assessments and interventions completed:  Yes.  Care Coordination Interventions:  Yes, provided.   Follow up plan: No further intervention required.   Encounter Outcome:  Patient Visit Completed.   Danford Bad, BSW, MSW, Printmaker Social Work Case Set designer Health  [facility], Population Health Direct Dial: [phone]  Fax: [phone] Email: [email or web address] Website: [email or web address]

## 2022-09-17 ENCOUNTER — Other Ambulatory Visit: Payer: Self-pay | Admitting: Student

## 2022-09-17 ENCOUNTER — Ambulatory Visit: Payer: Self-pay | Admitting: *Deleted

## 2022-09-17 ENCOUNTER — Other Ambulatory Visit: Payer: Self-pay | Admitting: Cardiology

## 2022-09-17 ENCOUNTER — Other Ambulatory Visit: Payer: Self-pay

## 2022-09-17 DIAGNOSIS — I480 Paroxysmal atrial fibrillation: Secondary | ICD-10-CM

## 2022-09-17 MED ORDER — RIVAROXABAN 20 MG PO TABS
20.0000 mg | ORAL_TABLET | Freq: Every day | ORAL | 1 refills | Status: DC
Start: 2022-09-17 — End: 2022-12-01

## 2022-09-17 NOTE — Telephone Encounter (Signed)
Prescription refill request for Xarelto received.  Indication:afib Last office visit:3/24 Weight:120.2  kg Age:62 Scr:1.08  8/24 CrCl:103.8  ml/min  Prescription refilled

## 2022-09-17 NOTE — Patient Outreach (Signed)
  Care Coordination   09/17/2022 Name: Becky [name] MRN: [id number] DOB: Sep 28, 1960   Care Coordination Outreach Attempts:  An unsuccessful telephone outreach was attempted for a scheduled appointment today.  Follow Up Plan:  Additional outreach attempts will be made to offer the patient care coordination information and services.   Encounter Outcome:  No Answer. Left HIPAA compliant VM.   Care Coordination Interventions:  No, not indicated. Staff message sent to scheduling care guide to reach out and reschedule.    Demetrios Loll, RN, BSN Care Management Coordinator [facility]  [facility] Direct Dial: [phone] Main #: [phone]

## 2022-09-17 NOTE — Telephone Encounter (Signed)
Refill Request.  

## 2022-09-23 DIAGNOSIS — M25611 Stiffness of right shoulder, not elsewhere classified: Secondary | ICD-10-CM | POA: Diagnosis not present

## 2022-09-23 DIAGNOSIS — M25511 Pain in right shoulder: Secondary | ICD-10-CM | POA: Diagnosis not present

## 2022-09-24 ENCOUNTER — Ambulatory Visit: Payer: BC Managed Care – PPO | Attending: Cardiology | Admitting: Cardiology

## 2022-09-24 ENCOUNTER — Encounter: Payer: Self-pay | Admitting: Cardiology

## 2022-09-24 VITALS — BP 110/62 | HR 73 | Ht 67.0 in | Wt 263.8 lb

## 2022-09-24 DIAGNOSIS — R600 Localized edema: Secondary | ICD-10-CM | POA: Diagnosis not present

## 2022-09-24 DIAGNOSIS — I2510 Atherosclerotic heart disease of native coronary artery without angina pectoris: Secondary | ICD-10-CM | POA: Diagnosis not present

## 2022-09-24 DIAGNOSIS — E782 Mixed hyperlipidemia: Secondary | ICD-10-CM

## 2022-09-24 DIAGNOSIS — I480 Paroxysmal atrial fibrillation: Secondary | ICD-10-CM

## 2022-09-24 DIAGNOSIS — I10 Essential (primary) hypertension: Secondary | ICD-10-CM | POA: Diagnosis not present

## 2022-09-24 NOTE — Patient Instructions (Addendum)
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
## 2022-09-26 DIAGNOSIS — M25611 Stiffness of right shoulder, not elsewhere classified: Secondary | ICD-10-CM | POA: Diagnosis not present

## 2022-09-26 DIAGNOSIS — M25511 Pain in right shoulder: Secondary | ICD-10-CM | POA: Diagnosis not present

## 2022-09-30 ENCOUNTER — Encounter: Payer: Self-pay | Admitting: Nutrition

## 2022-09-30 ENCOUNTER — Encounter: Payer: BC Managed Care – PPO | Attending: Family Medicine | Admitting: Nutrition

## 2022-09-30 VITALS — Ht 67.0 in | Wt 259.0 lb

## 2022-09-30 DIAGNOSIS — E118 Type 2 diabetes mellitus with unspecified complications: Secondary | ICD-10-CM | POA: Diagnosis not present

## 2022-09-30 DIAGNOSIS — I10 Essential (primary) hypertension: Secondary | ICD-10-CM

## 2022-09-30 DIAGNOSIS — I2510 Atherosclerotic heart disease of native coronary artery without angina pectoris: Secondary | ICD-10-CM

## 2022-09-30 DIAGNOSIS — E782 Mixed hyperlipidemia: Secondary | ICD-10-CM

## 2022-09-30 DIAGNOSIS — Z6841 Body Mass Index (BMI) 40.0 and over, adult: Secondary | ICD-10-CM | POA: Diagnosis not present

## 2022-09-30 NOTE — Patient Instructions (Addendum)
Goals  Get A1C down to 6%. Keep eating from the garden Get back to gym when you are able. Drink only water

## 2022-09-30 NOTE — Progress Notes (Signed)
Medical Nutrition Therapy 1400 1430  Primary concerns today: DM Type 2, Obesity  Referral diagnosis: E11.8, E66.01 Preferred learning style: No preference  Learning readiness: Ready   NUTRITION ASSESSMENT  Follow up Lost 6 lbs. Had shoulder surgery earlier this month.  Getting PT for her shoulder. Changes: Eating more fruits and vegetables meats. Drinking more water. Still has soda every now and then.  A1C was 7% right before her surgery. However, she had been on a few steriods for her shoulder and knees. FBS 100-120's Bedtime 113-140's. Now taking Glimepiride 2 g per day.   Goals set previously.  Focus on eating more foods from a garden.-done Eat three meals per day-done Eat meals on time.-improved. Cut out snack between meals and late at night- improve Get 8 hrs of sleep.improved Talk to MD about a sleep study to rule out sleep apnea. Focus on more whole plant based foods and cut out fast foods and processed junk food.-done Lose 1 lb per week- Lost 6 lbs Drink only water-95% there.  PCP Dr. Sherwood Gambler office. She is willing to commit to Lifestyle Medicine to focus on more of a whole plant based diet and healthier lifestyle.  Anthropometrics  Wt Readings from Last 3 Encounters:  09/24/22 263 lb 12.8 oz (119.7 kg)  07/31/22 265 lb (120.2 kg)  06/05/22 258 lb (117 kg)   Ht Readings from Last 3 Encounters:  09/24/22 5\' 7"  (1.702 m)  07/31/22 5\' 7"  (1.702 m)  06/05/22 5\' 7"  (1.702 m)   There is no height or weight on file to calculate BMI. @BMIFA @ Facility age limit for growth %iles is 20 years. Facility age limit for growth %iles is 20 years.    Clinical Medical Hx: HTN, Afib, CAD, DM Type 2, GERD,Hypothyroidism, Osteoarthritis. Hyperlipidemia. Medications: See chart Labs:     Latest Ref Rng & Units 08/28/2022   10:00 AM 01/30/2022    2:39 AM 01/27/2022    9:42 AM  CMP  Glucose 70 - 99 mg/dL 161  096  045   BUN 8 - 23 mg/dL 16  12  15    Creatinine 0.44 - 1.00 mg/dL  4.09  8.11  9.14   Sodium 135 - 145 mmol/L 137  134  140   Potassium 3.5 - 5.1 mmol/L 3.8  3.9  4.4   Chloride 98 - 111 mmol/L 106  104  108   CO2 22 - 32 mmol/L 23  25  26    Calcium 8.9 - 10.3 mg/dL 9.1  8.4  9.3   Total Protein 6.5 - 8.1 g/dL 6.6     Total Bilirubin 0.3 - 1.2 mg/dL 0.8     Alkaline Phos 38 - 126 U/L 63     AST 15 - 41 U/L 17     ALT 0 - 44 U/L 19      Lipid Panel     Component Value Date/Time   CHOL 162 08/28/2022 1000   CHOL 231 (H) 01/01/2017 1139   TRIG 81 08/28/2022 1000   HDL 62 08/28/2022 1000   HDL 57 01/01/2017 1139   CHOLHDL 2.6 08/28/2022 1000   VLDL 16 08/28/2022 1000   LDLCALC 84 08/28/2022 1000   LDLCALC 154 (H) 01/01/2017 1139   LABVLDL 20 01/01/2017 1139    Notable Signs/Symptoms: None  Lifestyle & Dietary Hx Lives with her husband. She cooks some and they eat out or get take out often.  Estimated daily fluid intake: 30 oz Supplements:  Sleep: poor Stress / self-care:  Current average weekly physical activity: ADL  Painful to walk and exercise due to knee issues.  24-Hr Dietary Recall Oatmeal or cereal  Lunch: pintos, sweet potato, cabbage, water Dinner same as lunch, water  Estimated Energy Needs Calories: 1200 Carbohydrate: 135g Protein: 90g Fat: 33g   NUTRITION DIAGNOSIS  NI-1.7 Predicted excessive energy intake As related to Morbid Obesity and Type 2 DM.  As evidenced by A1C > 6.5% and BMI 41.Marland Kitchen   NUTRITION INTERVENTION  Nutrition education (E-1) on the following topics:  Nutrition and Diabetes education provided on My Plate, CHO counting, meal planning, portion sizes, timing of meals, avoiding snacks between meals unless having a low blood sugar, target ranges for A1C and blood sugars, signs/symptoms and treatment of hyper/hypoglycemia, monitoring blood sugars, taking medications as prescribed, benefits of exercising 30 minutes per day and prevention of complications of DM.  Lifestyle Medicine  - Whole Food, Plant  Predominant Nutrition is highly recommended: Eat Plenty of vegetables, Mushrooms, fruits, Legumes, Whole Grains, Nuts, seeds in lieu of processed meats, processed snacks/pastries red meat, poultry, eggs.    -It is better to avoid simple carbohydrates including: Cakes, Sweet Desserts, Ice Cream, Soda (diet and regular), Sweet Tea, Candies, Chips, Cookies, Store Bought Juices, Alcohol in Excess of  1-2 drinks a day, Lemonade,  Artificial Sweeteners, Doughnuts, Coffee Creamers, "Sugar-free" Products, etc, etc.  This is not a complete list.....  Exercise: If you are able: 30 -60 minutes a day ,4 days a week, or 150 minutes a week.  The longer the better.  Combine stretch, strength, and aerobic activities.  If you were told in the past that you have high risk for cardiovascular diseases, you may seek evaluation by your heart doctor prior to initiating moderate to intense exercise programs.   Handouts Provided Include  LIfestyle Medicine Diabetes Handouts.  Learning Style & Readiness for Change Teaching method utilized: Visual & Auditory  Demonstrated degree of understanding via: Teach Back  Barriers to learning/adherence to lifestyle change: none  Goals Established by Pt  Goals  Get A1C down to 6%. Keep eating from the garden Get back to gym when you are able. Drink only water  MONITORING & EVALUATION Dietary intake, weekly physical activity, and blood sugars and weight  in 1 month.  Next Steps  Patient is to work on  eating better balanced meals.Marland Kitchen

## 2022-10-01 DIAGNOSIS — M25611 Stiffness of right shoulder, not elsewhere classified: Secondary | ICD-10-CM | POA: Diagnosis not present

## 2022-10-01 DIAGNOSIS — M25511 Pain in right shoulder: Secondary | ICD-10-CM | POA: Diagnosis not present

## 2022-10-06 DIAGNOSIS — G4733 Obstructive sleep apnea (adult) (pediatric): Secondary | ICD-10-CM | POA: Diagnosis not present

## 2022-10-08 DIAGNOSIS — M25611 Stiffness of right shoulder, not elsewhere classified: Secondary | ICD-10-CM | POA: Diagnosis not present

## 2022-10-08 DIAGNOSIS — M25511 Pain in right shoulder: Secondary | ICD-10-CM | POA: Diagnosis not present

## 2022-10-10 ENCOUNTER — Encounter (HOSPITAL_BASED_OUTPATIENT_CLINIC_OR_DEPARTMENT_OTHER): Payer: Self-pay

## 2022-10-10 ENCOUNTER — Other Ambulatory Visit: Payer: Self-pay | Admitting: Orthopaedic Surgery

## 2022-10-14 ENCOUNTER — Other Ambulatory Visit: Payer: Self-pay | Admitting: Adult Health

## 2022-10-14 MED ORDER — FLUCONAZOLE 150 MG PO TABS: ORAL_TABLET | ORAL | 0 refills | Status: DC

## 2022-10-15 DIAGNOSIS — M25511 Pain in right shoulder: Secondary | ICD-10-CM | POA: Diagnosis not present

## 2022-10-15 DIAGNOSIS — M25611 Stiffness of right shoulder, not elsewhere classified: Secondary | ICD-10-CM | POA: Diagnosis not present

## 2022-10-16 ENCOUNTER — Ambulatory Visit: Payer: Self-pay | Admitting: *Deleted

## 2022-10-16 ENCOUNTER — Encounter: Payer: Self-pay | Admitting: *Deleted

## 2022-10-16 NOTE — Patient Outreach (Signed)
Care Coordination   10/16/2022 Name: Becky [name] MRN: [id number] DOB: 1960-06-14   Care Coordination Outreach Attempts:  An unsuccessful telephone outreach was attempted for a scheduled appointment today.  Follow Up Plan:  Additional outreach attempts will be made to offer the patient care coordination information and services.   Encounter Outcome:  No Answer. Left HIPAA compliant VM.   Care Coordination Interventions:  No, not indicated. Staff message to scheduling care guide requesting outreach and rescheduling.     Demetrios Loll, RN, BSN Care Management Coordinator [facility]  [facility] Direct Dial: [phone] Main #: [phone]

## 2022-10-16 NOTE — Addendum Note (Signed)
Addended by: Gwenith Daily on: 10/16/2022 04:18 PM   Modules accepted: Orders

## 2022-10-23 DIAGNOSIS — H10013 Acute follicular conjunctivitis, bilateral: Secondary | ICD-10-CM | POA: Diagnosis not present

## 2022-10-24 DIAGNOSIS — M25511 Pain in right shoulder: Secondary | ICD-10-CM | POA: Diagnosis not present

## 2022-10-24 DIAGNOSIS — M25611 Stiffness of right shoulder, not elsewhere classified: Secondary | ICD-10-CM | POA: Diagnosis not present

## 2022-10-24 NOTE — Patient Outreach (Signed)
Care Coordination   Follow Up Visit Note   10/16/2022 Name: Becky [name] MRN: [id number] DOB: June 19, 1960  Becky [name] is a 62 y.o. year old female who sees Elfredia Nevins, MD for primary care. I spoke with  [name] by phone today.  What matters to the patients health and wellness today?  Managing blood sugar, improving from shoulder surgery, being able to have knee surgery    Goals Addressed             This Visit's Progress    Manage Blood Sugar   On track    Care Coordination Goals: Patient will follow-up with PCP every 3 months or as recommended Patient will take medication as prescribed and reach out to provider with any negative side effects Glimeperide 2mg  daily Flaxseed oil for elevated triglycerides Patient will continue to monitor and record blood sugar 2 times per day and as needed with glucometer, and will call PCP with any readings outside of recommended range Patient will take blood sugar log and meter to provider visits for review Patient will follow a modified carbohydrate diet and decrease simple carbohydrates and sugars 3 meals per day with 30 grams of carbohydrates and up to 2 snacks per day, if needed, with less than 15 grams of carbohydrates Patient will increase activity level as tolerated with an ultimate goal of at least 150 minutes of exercise per week Patient will reach out to RN Care Coordinator [phone] with any care coordination or resource needs      Manage Hypothyroidism       Care Management Goals: Patient will take thyroid medication as directed Patient will F/U with provider in 90 days as instructed Patient will repeat TSH in 6-8 weeks Patient reach out to RN Care Manager at [phone] with any resource or care coordination needs     Manage Musckuloskeletal Pain   On track    Care Coordination Goals: Patient will keep follow-up appointments with orthopedist regarding recovery from shoulder surgery Patient will  discuss management of knee pain with orthopedic surgeon once shoulder heals from surgery Patient will resume exercising after being cleared by orthopedist Patient will take medications as directed Patient will reach out to RN Care Coordinator [phone] with any care coordination or resource needs        SDOH assessments and interventions completed:  Yes  SDOH Interventions Today    Flowsheet Row Most Recent Value  SDOH Interventions   Transportation Interventions Intervention Not Indicated  Health Literacy Interventions Intervention Not Indicated        Care Coordination Interventions:  Yes, provided  Interventions Today    Flowsheet Row Most Recent Value  Chronic Disease   Chronic disease during today's visit Diabetes, Other, Hypertension (HTN)  [osteoarthritis, hypothyroidism]  General Interventions   General Interventions Discussed/Reviewed General Interventions Discussed, General Interventions Reviewed, Labs, Durable Medical Equipment (DME), Doctor Visits, Lipid Profile, Referral to Nurse  Labs Hgb A1c every 3 months, Kidney Function  [repeat TSH in 6-8 weeks, lipid panel]  Doctor Visits Discussed/Reviewed Doctor Visits Discussed, Doctor Visits Reviewed, Annual Wellness Visits, PCP, Specialist  Durable Medical Equipment (DME) Glucomoter  [blood sugar 109]  PCP/Specialist Visits Compliance with follow-up visit  [12/23/22 neurologist, 01/14/23 registered dietician, 03/11/23 cardiologist, PCP in 90 days]  Exercise Interventions   Exercise Discussed/Reviewed Exercise Discussed, Exercise Reviewed, Physical Activity  Physical Activity Discussed/Reviewed Physical Activity Discussed, Physical Activity Reviewed  Stann Main[place] resume exercising at [facility] when cleared by orthopedic surgeon since having shoulder surgery]  Education Interventions  Education Provided Provided Education  Provided Verbal Education On Nutrition, Labs, Blood Sugar Monitoring, When to see the doctor, Mental  Health/Coping with Illness, Exercise, Medication, Other  [effects of blood sugar on triglyceride levels, need to repeat TSH in at least 6-8 weeks since restarting thyroid medications]  Labs Reviewed Hgb A1c  [08/28/22 A1C 7.0%, TSH 7.004, potassium 3.8, triglycerides 81]  Mental Health Interventions   Mental Health Discussed/Reviewed Mental Health Discussed, Mental Health Reviewed  [no mental health concerns at this time]  Nutrition Interventions   Nutrition Discussed/Reviewed Nutrition Discussed, Nutrition Reviewed, Carbohydrate meal planning, Portion sizes, Decreasing sugar intake, Increasing proteins, Adding fruits and vegetables, Fluid intake  [eat 3 meals a day with 30 GM of CHO and up to 2 snacks, if needed, with less walking 15 GM CHO]  Pharmacy Interventions   Pharmacy Dicussed/Reviewed Pharmacy Topics Discussed, Pharmacy Topics Reviewed, Medications and their functions  [restarted on synthroid , flaxseed oil for triglycerides, potassium restarted, glimeperide 2mg ]  Safety Interventions   Safety Discussed/Reviewed Safety Discussed, Safety Reviewed, Fall Risk, Home Safety  Advanced Directive Interventions   Advanced Directives Discussed/Reviewed Advanced Directives Discussed, Advanced Care Planning  [encouraged to consider]       Follow up plan: Follow up call scheduled for 11/20/22    Encounter Outcome:  Patient Visit Completed   Demetrios Loll, RN, BSN Care Management Coordinator [facility]  [facility] Direct Dial: [phone] Main #: [phone]

## 2022-10-29 DIAGNOSIS — M25511 Pain in right shoulder: Secondary | ICD-10-CM | POA: Diagnosis not present

## 2022-10-29 DIAGNOSIS — M25611 Stiffness of right shoulder, not elsewhere classified: Secondary | ICD-10-CM | POA: Diagnosis not present

## 2022-11-04 ENCOUNTER — Encounter: Payer: Self-pay | Admitting: *Deleted

## 2022-11-04 NOTE — Patient Outreach (Signed)
   Documentation Note  Patient's PCP has elected to no longer participate with [facility] Care Management. PCP office will provide Care Management services. RN Care Management Coordinator removed from care team and goals have been closed. Last telephone note securely faxed to PCP for handoff.   Demetrios Loll, RN, BSN Care Management Coordinator [facility]  [facility] Direct Dial: [phone] Main #: [phone]

## 2022-11-06 DIAGNOSIS — G4733 Obstructive sleep apnea (adult) (pediatric): Secondary | ICD-10-CM | POA: Diagnosis not present

## 2022-11-07 ENCOUNTER — Telehealth: Payer: Self-pay | Admitting: Family Medicine

## 2022-11-07 ENCOUNTER — Ambulatory Visit
Admission: EM | Admit: 2022-11-07 | Discharge: 2022-11-07 | Disposition: A | Discharge status: Home / Self Care | Payer: BC Managed Care – PPO | Attending: Family Medicine | Admitting: Family Medicine

## 2022-11-07 DIAGNOSIS — S161XXA Strain of muscle, fascia and tendon at neck level, initial encounter: Secondary | ICD-10-CM | POA: Diagnosis not present

## 2022-11-07 MED ORDER — METHYLPREDNISOLONE 4 MG PO TBPK: ORAL_TABLET | ORAL | 0 refills | Status: DC

## 2022-11-07 MED ORDER — METHYLPREDNISOLONE 16 MG PO TABS: 16.0000 mg | ORAL_TABLET | Freq: Every day | ORAL | 0 refills | Status: DC

## 2022-11-07 MED ORDER — TIZANIDINE HCL 2 MG PO CAPS: 2.0000 mg | ORAL_CAPSULE | Freq: Three times a day (TID) | ORAL | 0 refills | Status: DC | PRN

## 2022-11-07 NOTE — Discharge Instructions (Addendum)
Continue heat, massage, muscle rubs, and try the steroid and muscle relaxer that I have sent.  Follow-up with orthopedics if not resolving.
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
## 2022-11-07 NOTE — ED Triage Notes (Signed)
Pt reports neck pain x 2 days .   Denies injury. Had a dry needling procedure done x 3 days ago at [facility]

## 2022-11-07 NOTE — Telephone Encounter (Signed)
Contacted Pharmacy for patient and to see why they could not fill her Methyl 16mg . Pharmacy stated they did not have it in stock, but they had Methyl 4mg  dose pack. Per provider patient could be switched to that. Script changed.

## 2022-11-07 NOTE — Telephone Encounter (Signed)
Dosing for medrol pack changed per pharmacy request due to availability

## 2022-11-13 ENCOUNTER — Telehealth: Payer: Self-pay | Admitting: *Deleted

## 2022-11-13 NOTE — Telephone Encounter (Signed)
Contacted to schedule intake assessment for PREP Class beginning 11/25/2022 at the [facility]. Left voicemail to return my call.

## 2022-11-14 ENCOUNTER — Telehealth: Payer: Self-pay | Admitting: *Deleted

## 2022-11-14 NOTE — Telephone Encounter (Signed)
Assessment appointment for PREP class scheduled for 11/20/2022 at 10:45 at the [facility].

## 2022-11-20 ENCOUNTER — Encounter: Payer: Self-pay | Admitting: *Deleted

## 2022-11-20 ENCOUNTER — Other Ambulatory Visit: Payer: Self-pay | Admitting: Adult Health

## 2022-11-20 ENCOUNTER — Ambulatory Visit: Payer: Self-pay | Admitting: *Deleted

## 2022-11-20 ENCOUNTER — Other Ambulatory Visit: Payer: Self-pay | Admitting: Family Medicine

## 2022-11-20 DIAGNOSIS — I10 Essential (primary) hypertension: Secondary | ICD-10-CM
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
## 2022-11-21 ENCOUNTER — Telehealth: Payer: Self-pay | Admitting: *Deleted

## 2022-11-21 NOTE — Progress Notes (Signed)
  Care Coordination Note  11/21/2022 Name: Jobi Brazel MRN: [id number] DOB: April 05, 1960  Yezenia Shew is a 63 y.o. year old female who is a primary care patient of Elfredia Nevins, MD and is actively engaged with the care management team. I reached out to [name] by phone today to assist with re-scheduling an initial visit with the BSW  Follow up plan: Telephone appointment with care management team member scheduled for:12/02/22  [facility] Coordination Care Guide  Direct Dial: [phone]

## 2022-11-21 NOTE — Progress Notes (Signed)
  Care Coordination Note  11/21/2022 Name: Jamari Waldron MRN: [id number] DOB: [birth year]  Becky [name] is a 63 y.o. year old female who is a primary care patient of Elfredia Nevins, MD and is actively engaged with the care management team. I reached out to [name] by phone today to assist with scheduling an initial visit with the BSW  Follow up plan: Unsuccessful telephone outreach attempt made. A HIPAA compliant phone message was left for the patient providing contact information and requesting a return call.   [facility]  Care Coordination Care Guide  Direct Dial: [phone]

## 2022-11-26 ENCOUNTER — Encounter: Payer: Self-pay | Admitting: *Deleted

## 2022-11-26 NOTE — Progress Notes (Signed)
[facility] PREP Weekly Session  Patient Details  Name: Zaryn Wheatcraft MRN: [id number] Date of Birth: Mar 23, 1960 Age: 62 y.o. PCP: Elfredia Nevins, MD  There were no vitals filed for this visit.   [facility] Weekly seesion - 11/25/22 1500       [facility] "PREP" Location   [facility] "PREP" Location [facility]      Weekly Session   Topic Discussed Goal setting and welcome to the program   Introductions, text book review, perceived exertion scale, tour facility, homework: not sitting longer than 30 minutes.   Classes attended to date 1             Remo Lipps 11/26/2022, 11:18 AM

## 2022-12-01 ENCOUNTER — Other Ambulatory Visit: Payer: Self-pay | Admitting: *Deleted

## 2022-12-01 DIAGNOSIS — I480 Paroxysmal atrial fibrillation: Secondary | ICD-10-CM

## 2022-12-01 MED ORDER — RIVAROXABAN 20 MG PO TABS
20.0000 mg | ORAL_TABLET | Freq: Every day | ORAL | 1 refills | Status: DC
Start: 2022-12-01 — End: 2022-12-08

## 2022-12-01 NOTE — Telephone Encounter (Signed)
Prescription refill request for Xarelto received.  Indication: PAF Last office visit: 09/24/22  Becky Ferry MD Weight: 119.7kg Age: 62 Scr: 1.08 on 08/28/22  Epic CrCl: 103.37  Based on above findings Xarelto 20mg  daily is the appropriate dose.  Refill approved.

## 2022-12-02 ENCOUNTER — Encounter: Payer: Self-pay | Admitting: *Deleted

## 2022-12-02 ENCOUNTER — Ambulatory Visit: Payer: Self-pay

## 2022-12-02 NOTE — Patient Outreach (Signed)
  Care Coordination   Initial Visit Note   12/02/2022 Name: Becky [name] MRN: [id number] DOB: Aug 21, 1960  Becky [name] is a 62 y.o. year old female who sees Elfredia Nevins, MD for primary care. I spoke with  [name] by phone today.  What matters to the patients health and wellness today?  Patient has a cracked tooth and needed dental services.  Patient is not in an emergency but is unable to find a dentist that will accept Medicaid.    Goals Addressed             This Visit's Progress    Care Coordination Activities       Interventions Today    Flowsheet Row Most Recent Value  Chronic Disease   Chronic disease during today's visit Diabetes, Atrial Fibrillation (AFib), Hypertension (HTN)  General Interventions   General Interventions Discussed/Reviewed General Interventions Discussed, General Interventions Reviewed, Communication with  [Pt needs dental work and has Medicaid. Many dentist aren't accepting Medicaid. Pt agreed to contact previous dentist. Sw will forward Medicaid link for dentist and a few that accepts Medicaid.]  Communication with --  [SW t/c over 10 dental practices. [facility] and [facility] are accepting but appointment are May and July. Message left for Dr. Harle Stanford for a call back. Pt also recommend to call [facility].]              SDOH assessments and interventions completed:  Yes  SDOH Interventions Today    Flowsheet Row Most Recent Value  SDOH Interventions   Food Insecurity Interventions Intervention Not Indicated  Housing Interventions Intervention Not Indicated  Transportation Interventions Intervention Not Indicated  Utilities Interventions Intervention Not Indicated        Care Coordination Interventions:  Yes, provided   Follow up plan: Follow up call scheduled for 12/11/22 at 11:30am    Encounter Outcome:  Patient Visit Completed

## 2022-12-02 NOTE — Patient Instructions (Signed)
Visit Information  Thank you for taking time to visit with me today. Please don't hesitate to contact me if I can be of assistance to you.   Following are the goals we discussed today:  Patient will contact her previous dentist to determine if Medicaid is accepted. Patient will contact dentist provided by SW for an appointment. Patient will review the link for Medicaid dental providers for any additional options.   Our next appointment is by telephone on 12/11/22 at 3:30pm  Please call the care guide team at [phone] if you need to cancel or reschedule your appointment.   If you are experiencing a Mental Health or Behavioral Health Crisis or need someone to talk to, please call 911  Patient verbalizes understanding of instructions and care plan provided today and agrees to view in MyChart. Active MyChart status and patient understanding of how to access instructions and care plan via MyChart confirmed with patient.     Telephone follow up appointment with care management team member scheduled for: 12/11/22 at 3:30pm   Lysle Morales, BSW Social Worker [phone]

## 2022-12-02 NOTE — Progress Notes (Signed)
[facility] PREP Weekly Session  Patient Details  Name: Becky [name] MRN: [id number] Date of Birth: [birth year] Age: 62 y.o. PCP: Elfredia Nevins, MD  Vitals:   12/01/22 1330  Weight: 269 lb 12.8 oz (122.4 kg)     [facility] Weekly seesion - 12/01/22 1500       [facility] "PREP" Location   [facility] "PREP" Location [facility]      Weekly Session   Topic Discussed Importance of resistance training;Other ways to be active   Discussed national standards:150 minutes of cardio/week, strength training 2-4 times/week for 20-40 minute sessions. Balance work and flexibility.   Minutes exercised this week 90 minutes    Classes attended to date 3             Remo Lipps 12/02/2022, 7:56 AM

## 2022-12-05 DIAGNOSIS — G4733 Obstructive sleep apnea (adult) (pediatric): Secondary | ICD-10-CM | POA: Diagnosis not present

## 2022-12-07 DIAGNOSIS — G4733 Obstructive sleep apnea (adult) (pediatric): Secondary | ICD-10-CM | POA: Diagnosis not present

## 2022-12-08 ENCOUNTER — Telehealth: Payer: Self-pay | Admitting: Cardiology

## 2022-12-08 ENCOUNTER — Ambulatory Visit: Payer: Self-pay | Admitting: *Deleted

## 2022-12-08 DIAGNOSIS — I480 Paroxysmal atrial fibrillation: Secondary | ICD-10-CM

## 2022-12-08 MED ORDER — RIVAROXABAN 20 MG PO TABS
20.0000 mg | ORAL_TABLET | Freq: Every day | ORAL | 1 refills | Status: DC
Start: 2022-12-08 — End: 2023-04-27

## 2022-12-08 NOTE — Telephone Encounter (Signed)
Resent Rx for Xarelto.  Pharmacy states they didn't receive it even though it was confirmed.

## 2022-12-08 NOTE — Telephone Encounter (Signed)
Pt c/o medication issue:  1. Name of Medication:   rivaroxaban (XARELTO) 20 MG TABS tablet    2. How are you currently taking this medication (dosage and times per day)?   3. Are you having a reaction (difficulty breathing--STAT)?   4. What is your medication issue? Pharmacy is requesting RX be sent again. States they did not receive.

## 2022-12-09 ENCOUNTER — Other Ambulatory Visit: Payer: Self-pay | Admitting: Adult Health

## 2022-12-09 ENCOUNTER — Encounter: Payer: Self-pay | Admitting: *Deleted

## 2022-12-09 ENCOUNTER — Other Ambulatory Visit: Payer: Self-pay | Admitting: Cardiology

## 2022-12-09 NOTE — Progress Notes (Signed)
[facility] PREP Weekly Session  Patient Details  Name: Becky [name] MRN: [id number] Date of Birth: [birth year] Age: 62 y.o. PCP: Elfredia Nevins, MD  Vitals:   12/09/22 1330  Weight: 267 lb 3.2 oz (121.2 kg)     [facility] Weekly seesion - 12/09/22 1500       [facility] "PREP" Location   [facility] "PREP" Location [facility]      Weekly Session   Topic Discussed Healthy eating tips   Discussed components of healthy diet and macronutrients; fats, carbohydrates, proteins. Added sugars 24gms/day for women, 36gms/day for men. Limit salt intake to 1500-2300mg  per day.   Minutes exercised this week 90 minutes    Classes attended to date 5             Remo Lipps 12/09/2022, 3:41 PM

## 2022-12-10 NOTE — Patient Outreach (Signed)
  Care Coordination   Follow Up Visit Note   12/08/2022 Name: Becky [name] MRN: [id number] DOB: 15-Mar-1960  Becky [name] is a 62 y.o. year old female who sees Elfredia Nevins, MD for primary care. I spoke with  [name] by phone today.  What matters to the patients health and wellness today?  Increasing physical activity level   SDOH assessments and interventions completed:  No   Care Coordination Interventions:  Yes, provided  Interventions Today    Flowsheet Row Most Recent Value  Chronic Disease   Chronic disease during today's visit Diabetes, Atrial Fibrillation (AFib), Hypertension (HTN), Other  [hypothyroidsim]  General Interventions   General Interventions Discussed/Reviewed General Interventions Discussed, General Interventions Reviewed, Doctor Visits, Durable Medical Equipment (DME), Labs, Annual Foot Exam, Annual Eye Exam  Labs Hgb A1c every 3 months  Doctor Visits Discussed/Reviewed Doctor Visits Discussed, Doctor Visits Reviewed, PCP, Specialist, Annual Wellness Visits  Durable Medical Equipment (DME) BP Cuff, Glucomoter  PCP/Specialist Visits Compliance with follow-up visit  [Follow-up with PCP as directed and for any Care Management needs. Neurologist 12/23/22, Dietician 01/14/23, Cardiologist 03/11/23]  Exercise Interventions   Exercise Discussed/Reviewed Exercise Discussed, Exercise Reviewed, Physical Activity  Physical Activity Discussed/Reviewed Physical Activity Discussed, Physical Activity Reviewed, PREP  [Working with PREP program in [place] twice a week]  Education Interventions   Education Provided Provided Education  Provided Verbal Education On Nutrition, Foot Care, Eye Care, Blood Sugar Monitoring, When to see the doctor, Medication, Exercise  Nutrition Interventions   Nutrition Discussed/Reviewed Nutrition Discussed, Nutrition Reviewed, Carbohydrate meal planning, Decreasing sugar intake, Portion sizes, Adding fruits and vegetables, Fluid  intake  Pharmacy Interventions   Pharmacy Dicussed/Reviewed Pharmacy Topics Discussed, Pharmacy Topics Reviewed, Medications and their functions  Safety Interventions   Safety Discussed/Reviewed Safety Discussed, Safety Reviewed       Follow up plan: No further intervention required. Patient's Primary Care office is not partnering with the [facility] for care management and will be providing Care Management Services themselves. This final Care Management note will be securely faxed to the PCP office for handoff. Patient has been encouraged to reach out to their PCP office with any resource or care management needs.   Encounter Outcome:  Patient Visit Completed   Demetrios Loll, RN, BSN Care Management Coordinator [facility]  [facility] Direct Dial: [phone] Main #: [phone]

## 2022-12-11 ENCOUNTER — Ambulatory Visit: Payer: Self-pay

## 2022-12-11 NOTE — Patient Outreach (Signed)
  Care Coordination   Follow Up Visit Note   12/11/2022 Name: Becky [name] MRN: [id number] DOB: March 30, 1960  Becky [name] is a 62 y.o. year old female who sees Elfredia Nevins, MD for primary care. I spoke with  [name] by phone today.  What matters to the patients health and wellness today?  Patient has a scheduled dental appointment.  Goal achieved.    Goals Addressed             This Visit's Progress    Care Coordination Activities       Interventions Today    Flowsheet Row Most Recent Value  General Interventions   General Interventions Discussed/Reviewed General Interventions Discussed, General Interventions Reviewed  [Patient was able to schedule a dental appointment for January 8.]              SDOH assessments and interventions completed:  No     Care Coordination Interventions:  Yes, provided   Follow up plan: No further intervention required.   Encounter Outcome:  Patient Visit Completed

## 2022-12-11 NOTE — Patient Instructions (Signed)
Visit Information  Thank you for taking time to visit with me today. Please don't hesitate to contact me if I can be of assistance to you.   Following are the goals we discussed today:  Patient will visit the dentist on January 14, 2023.   If you are experiencing a Mental Health or Behavioral Health Crisis or need someone to talk to, please call 911  Patient verbalizes understanding of instructions and care plan provided today and agrees to view in MyChart. Active MyChart status and patient understanding of how to access instructions and care plan via MyChart confirmed with patient.     No further follow up required: Patient does not request a follow up visit. Lysle Morales, BSW Social Worker [phone]

## 2022-12-16 ENCOUNTER — Encounter: Payer: Self-pay | Admitting: *Deleted

## 2022-12-16 NOTE — Progress Notes (Signed)
[facility] PREP Weekly Session  Patient Details  Name: Becky [name] MRN: [id number] Date of Birth: 1960-03-05 Age: 62 y.o. PCP: Elfredia Nevins, MD  Vitals:   12/16/22 1330  Weight: 270 lb 12.8 oz (122.8 kg)     [facility] Weekly seesion - 12/16/22 1500       [facility] "PREP" Location   [facility] "PREP" Location [facility]      Weekly Session   Topic Discussed Health habits;Water   Harmful effects of sugar, ways to reduce dietary sugars, sugar demo   Minutes exercised this week 185 minutes    Classes attended to date 7             Remo Lipps 12/16/2022, 3:43 PM
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
## 2022-12-22 NOTE — Patient Instructions (Addendum)
Please continue using your CPAP regularly. While your insurance requires that you use CPAP at least 4 hours each night on 70% of the nights, I recommend, that you not skip any nights and use it throughout the night if you can. Getting used to CPAP and staying with the treatment long term does take time and patience and discipline. Untreated obstructive sleep apnea when it is moderate to severe can have an adverse impact on cardiovascular health and raise her risk for heart disease, arrhythmias, hypertension, congestive heart failure, stroke and diabetes. Untreated obstructive sleep apnea causes sleep disruption, nonrestorative sleep, and sleep deprivation. This can have an impact on your day to day functioning and cause daytime sleepiness and impairment of cognitive function, memory loss, mood disturbance, and problems focussing. Using CPAP regularly can improve these symptoms.  We will update supply orders, today. I will ask DME to adjust your pressure from 11 to 12cmH20. Try adjusting your humidity. You can also use Biotene products. You can get these over the counter at any pharmacy. I will ask about the set up date. We will plan to replace your machine at 62 year old.   Follow up in 6 months

## 2022-12-23 ENCOUNTER — Encounter: Payer: Self-pay | Admitting: *Deleted

## 2022-12-23 ENCOUNTER — Ambulatory Visit (INDEPENDENT_AMBULATORY_CARE_PROVIDER_SITE_OTHER): Payer: BC Managed Care – PPO | Admitting: Family Medicine

## 2022-12-23 ENCOUNTER — Encounter: Payer: Self-pay | Admitting: Family Medicine

## 2022-12-23 VITALS — BP 116/74 | HR 58 | Ht 67.0 in | Wt 269.0 lb

## 2022-12-23 DIAGNOSIS — G4733 Obstructive sleep apnea (adult) (pediatric): Secondary | ICD-10-CM | POA: Diagnosis not present

## 2022-12-23 NOTE — Progress Notes (Signed)
[facility] PREP Weekly Session  Patient Details  Name: Becky [name] MRN: [id number] Date of Birth: 02-20-1960 Age: 62 y.o. PCP: Elfredia Nevins, MD  There were no vitals filed for this visit.   [facility] Weekly seesion - 12/23/22 1500       [facility] "PREP" Location   [facility] "PREP" Location [facility]      Weekly Session   Topic Discussed Eating for the season;Restaurant Eating   Daily salt intake 1500mg -2300mg /day. Seasonings to use in place of salt, salt demo, reading food labels.   Classes attended to date 68             Remo Lipps 12/23/2022, 5:55 PM

## 2022-12-25 ENCOUNTER — Other Ambulatory Visit: Payer: Self-pay | Admitting: Cardiology

## 2022-12-25 DIAGNOSIS — M159 Polyosteoarthritis, unspecified: Secondary | ICD-10-CM | POA: Diagnosis not present

## 2022-12-25 DIAGNOSIS — M5416 Radiculopathy, lumbar region: Secondary | ICD-10-CM | POA: Diagnosis not present

## 2022-12-25 DIAGNOSIS — I10 Essential (primary) hypertension: Secondary | ICD-10-CM | POA: Diagnosis not present

## 2022-12-25 DIAGNOSIS — M62830 Muscle spasm of back: Secondary | ICD-10-CM | POA: Diagnosis not present

## 2023-01-01 ENCOUNTER — Ambulatory Visit: Payer: BC Managed Care – PPO | Admitting: Orthopaedic Surgery

## 2023-01-04 DIAGNOSIS — G4733 Obstructive sleep apnea (adult) (pediatric): Secondary | ICD-10-CM | POA: Diagnosis not present

## 2023-01-05 DIAGNOSIS — M25511 Pain in right shoulder: Secondary | ICD-10-CM | POA: Diagnosis not present

## 2023-01-05 DIAGNOSIS — J0100 Acute maxillary sinusitis, unspecified: Secondary | ICD-10-CM | POA: Diagnosis not present

## 2023-01-05 DIAGNOSIS — Z6841 Body Mass Index (BMI) 40.0 and over, adult: Secondary | ICD-10-CM | POA: Diagnosis not present

## 2023-01-05 DIAGNOSIS — M25611 Stiffness of right shoulder, not elsewhere classified: Secondary | ICD-10-CM | POA: Diagnosis not present

## 2023-01-05 DIAGNOSIS — R6889 Other general symptoms and signs: Secondary | ICD-10-CM | POA: Diagnosis not present

## 2023-01-05 DIAGNOSIS — Z20828 Contact with and (suspected) exposure to other viral communicable diseases: Secondary | ICD-10-CM | POA: Diagnosis not present

## 2023-01-13 ENCOUNTER — Encounter: Payer: Self-pay | Admitting: *Deleted

## 2023-01-14 ENCOUNTER — Encounter: Payer: BC Managed Care – PPO | Attending: Internal Medicine | Admitting: Nutrition

## 2023-01-14 ENCOUNTER — Encounter: Payer: Self-pay | Admitting: Nutrition

## 2023-01-14 VITALS — Ht 67.0 in | Wt 274.0 lb

## 2023-01-14 DIAGNOSIS — E118 Type 2 diabetes mellitus with unspecified complications: Secondary | ICD-10-CM | POA: Diagnosis not present

## 2023-01-14 DIAGNOSIS — E782 Mixed hyperlipidemia: Secondary | ICD-10-CM | POA: Insufficient documentation

## 2023-01-14 DIAGNOSIS — I2510 Atherosclerotic heart disease of native coronary artery without angina pectoris: Secondary | ICD-10-CM | POA: Diagnosis not present

## 2023-01-14 DIAGNOSIS — I2583 Coronary atherosclerosis due to lipid rich plaque: Secondary | ICD-10-CM | POA: Insufficient documentation

## 2023-01-14 DIAGNOSIS — I10 Essential (primary) hypertension: Secondary | ICD-10-CM | POA: Insufficient documentation

## 2023-01-14 NOTE — Progress Notes (Signed)
 [facility] Weekly Session  Patient Details  Name: Becky [name] MRN: [id number] Date of Birth: [birth year] Age: 63 y.o. PCP: [name], MD  Vitals:   01/13/23 1330  Weight: 272 lb 12.8 oz (123.7 kg)     [facility] Weekly seesion - 01/13/23 1500       [facility] PREP Location   [facility] PREP Location [facility]      Weekly Session   Topic Discussed Stress management and problem solving;Other   Sleep and its importance to good health. Ways to improve sleep quality. Meditation exercise.   Minutes exercised this week 90 minutes    Classes attended to date 103             Gorge Line 01/14/2023, 6:56 PM

## 2023-01-14 NOTE — Progress Notes (Signed)
 Medical Nutrition Therapy 1115  1145  Primary concerns today: DM Type 2, Obesity  Referral diagnosis: E11.8, E66.01 Preferred learning style: No preference  Learning readiness: Ready   NUTRITION ASSESSMENT  Follow up  Occassionally checks blood sugars in am. 109-120's and night time in 130-135 mg/dl. Has been working on eating better food choices but still drinks sodas every now and then. No Recent A1C yet. Goes back to PCP in March 2025. Changing insurance and doesn't know what meter and strips will be covered in 2025. Taking Glimepiride  2 mg a day Gained back 5 lbs over the holidays. She thinks it's more fluid weight due to in fluctuating 3-5 lbs every few days. Trying to eat more vegetables and drinking more water .   PCP Dr. Bertell office. She is willing to commit to Lifestyle Medicine to focus on more of a whole plant based diet and healthier lifestyle.  Anthropometrics  Wt Readings from Last 3 Encounters:  01/14/23 274 lb (124.3 kg)  12/23/22 269 lb (122 kg)  12/16/22 270 lb 12.8 oz (122.8 kg)   Ht Readings from Last 3 Encounters:  01/14/23 5' 7 (1.702 m)  12/23/22 5' 7 (1.702 m)  09/30/22 5' 7 (1.702 m)   Body mass index is 42.91 kg/m. @BMIFA @ Facility age limit for growth %iles is 20 years. Facility age limit for growth %iles is 20 years.   Clinical Medical Hx: HTN, Afib, CAD, DM Type 2, GERD,Hypothyroidism, Osteoarthritis. Hyperlipidemia. Medications: See chart Labs:     Latest Ref Rng & Units 08/28/2022   10:00 AM 01/30/2022    2:39 AM 01/27/2022    9:42 AM  CMP  Glucose 70 - 99 mg/dL 882  881  872   BUN 8 - 23 mg/dL 16  12  15    Creatinine 0.44 - 1.00 mg/dL 8.91  9.06  8.87   Sodium 135 - 145 mmol/L 137  134  140   Potassium 3.5 - 5.1 mmol/L 3.8  3.9  4.4   Chloride 98 - 111 mmol/L 106  104  108   CO2 22 - 32 mmol/L 23  25  26    Calcium  8.9 - 10.3 mg/dL 9.1  8.4  9.3   Total Protein 6.5 - 8.1 g/dL 6.6     Total Bilirubin 0.3 - 1.2 mg/dL 0.8      Alkaline Phos 38 - 126 U/L 63     AST 15 - 41 U/L 17     ALT 0 - 44 U/L 19      Lipid Panel     Component Value Date/Time   CHOL 162 08/28/2022 1000   CHOL 231 (H) 01/01/2017 1139   TRIG 81 08/28/2022 1000   HDL 62 08/28/2022 1000   HDL 57 01/01/2017 1139   CHOLHDL 2.6 08/28/2022 1000   VLDL 16 08/28/2022 1000   LDLCALC 84 08/28/2022 1000   LDLCALC 154 (H) 01/01/2017 1139   LABVLDL 20 01/01/2017 1139    Notable Signs/Symptoms: None  Lifestyle & Dietary Hx Lives with her husband. She cooks some and they eat out or get take out often.  Estimated daily fluid intake: 30 oz Supplements:  Sleep: poor Stress / self-care:  Current average weekly physical activity: ADL  Painful to walk and exercise due to knee issues.  24-Hr Dietary Recall B) Cornflakes with whole milk L) Baked chicken, green beans, creamed potatoes, water  D) Toss salad with tomatoes, cukes,  water  Estimated Energy Needs Calories: 1200 Carbohydrate: 135g Protein: 90g Fat: 33g  NUTRITION DIAGNOSIS  NI-1.7 Predicted excessive energy intake As related to Morbid Obesity and Type 2 DM.  As evidenced by A1C > 6.5% and BMI 41.SABRA   NUTRITION INTERVENTION  Nutrition education (E-1) on the following topics:  Nutrition and Diabetes education provided on My Plate, CHO counting, meal planning, portion sizes, timing of meals, avoiding snacks between meals unless having a low blood sugar, target ranges for A1C and blood sugars, signs/symptoms and treatment of hyper/hypoglycemia, monitoring blood sugars, taking medications as prescribed, benefits of exercising 30 minutes per day and prevention of complications of DM.  Lifestyle Medicine  - Whole Food, Plant Predominant Nutrition is highly recommended: Eat Plenty of vegetables, Mushrooms, fruits, Legumes, Whole Grains, Nuts, seeds in lieu of processed meats, processed snacks/pastries red meat, poultry, eggs.    -It is better to avoid simple carbohydrates including:  Cakes, Sweet Desserts, Ice Cream, Soda (diet and regular), Sweet Tea, Candies, Chips, Cookies, Store Bought Juices, Alcohol in Excess of  1-2 drinks a day, Lemonade,  Artificial Sweeteners, Doughnuts, Coffee Creamers, Sugar-free Products, etc, etc.  This is not a complete list.....  Exercise: If you are able: 30 -60 minutes a day ,4 days a week, or 150 minutes a week.  The longer the better.  Combine stretch, strength, and aerobic activities.  If you were told in the past that you have high risk for cardiovascular diseases, you may seek evaluation by your heart doctor prior to initiating moderate to intense exercise programs.   Handouts Provided Include  LIfestyle Medicine Diabetes Handouts.  Learning Style & Readiness for Change Teaching method utilized: Visual & Auditory  Demonstrated degree of understanding via: Teach Back  Barriers to learning/adherence to lifestyle change: none  Goals Established by Pt  Goals Eat 2 vegetables with lunch and dinner daily Cut out sodas 100% Walk 15 minutes twice a day or more as tolerated. Cut out processed foods and fast foods Call heart MD if your weight goes up more than 3 lbs over night or 5 lbs within the week. Weigh daily Avoid salt and salty processed foods- don't use fat back, salt pork or salt seasonings. Get A1C to 6.5% or below.  MONITORING & EVALUATION Dietary intake, weekly physical activity, and blood sugars and weight  in 3-4 month.  Next Steps  Patient is to work on  eating better balanced meals.SABRA

## 2023-01-14 NOTE — Patient Instructions (Signed)
 Goals Eat 2 vegetables with lunch and dinner daily Cut out sodas 100% Walk 15 minutes twice a day or more as tolerated. Cut out processed foods and fast foods Call heart MD if your weight goes up more than 3 lbs over night or 5 lbs within the week. Weigh daily Avoid salt and salty processed foods- don't use fat back, salt pork or salt seasonings. Get A1C to 6.5% or below.

## 2023-01-15 DIAGNOSIS — M25611 Stiffness of right shoulder, not elsewhere classified: Secondary | ICD-10-CM | POA: Diagnosis not present

## 2023-01-15 DIAGNOSIS — M25511 Pain in right shoulder: Secondary | ICD-10-CM | POA: Diagnosis not present

## 2023-01-19 DIAGNOSIS — M25511 Pain in right shoulder: Secondary | ICD-10-CM | POA: Diagnosis not present

## 2023-01-19 DIAGNOSIS — M25611 Stiffness of right shoulder, not elsewhere classified: Secondary | ICD-10-CM | POA: Diagnosis not present

## 2023-01-20 ENCOUNTER — Encounter: Payer: Self-pay | Admitting: *Deleted

## 2023-01-20 NOTE — Progress Notes (Signed)
 [facility] Weekly Session  Patient Details  Name: Becky [name] MRN: [id number] Date of Birth: 1960-01-29 Age: 63 y.o. PCP: [name], MD  Vitals:   01/20/23 2037  Weight: 272 lb 8 oz (123.6 kg)     [facility] Weekly seesion - 01/20/23 2000       [facility] PREP Location   [facility] PREP Location [facility]      Weekly Session   Topic Discussed Expectations and non-scale victories   Evaluate progress towards healthier diet, label reading, independant exercise. Exercise motivation tips. Discuss Blue Zones.   Minutes exercised this week 130 minutes    Classes attended to date 51             Gorge Line 01/20/2023, 8:39 PM

## 2023-01-22 ENCOUNTER — Other Ambulatory Visit: Payer: Self-pay | Admitting: Gastroenterology

## 2023-01-22 ENCOUNTER — Other Ambulatory Visit: Payer: Self-pay | Admitting: Cardiology

## 2023-01-22 ENCOUNTER — Other Ambulatory Visit: Payer: Self-pay | Admitting: Adult Health

## 2023-01-22 DIAGNOSIS — M25511 Pain in right shoulder: Secondary | ICD-10-CM | POA: Diagnosis not present

## 2023-01-22 DIAGNOSIS — M25611 Stiffness of right shoulder, not elsewhere classified: Secondary | ICD-10-CM | POA: Diagnosis not present

## 2023-01-22 NOTE — Telephone Encounter (Signed)
 Pt needs ov before anymore refills

## 2023-01-22 NOTE — Telephone Encounter (Signed)
On file pt not taking

## 2023-01-23 ENCOUNTER — Emergency Department (HOSPITAL_COMMUNITY): Payer: BC Managed Care – PPO

## 2023-01-23 ENCOUNTER — Encounter (HOSPITAL_COMMUNITY): Payer: Self-pay | Admitting: *Deleted

## 2023-01-23 ENCOUNTER — Other Ambulatory Visit: Payer: Self-pay

## 2023-01-23 ENCOUNTER — Emergency Department (HOSPITAL_COMMUNITY)
Admission: EM | Admit: 2023-01-23 | Discharge: 2023-01-23 | Disposition: A | Discharge status: Home / Self Care | Payer: BC Managed Care – PPO

## 2023-01-23 DIAGNOSIS — Z7901 Long term (current) use of anticoagulants: Secondary | ICD-10-CM | POA: Insufficient documentation

## 2023-01-23 DIAGNOSIS — Z7984 Long term (current) use of oral hypoglycemic drugs: Secondary | ICD-10-CM | POA: Insufficient documentation

## 2023-01-23 DIAGNOSIS — I10 Essential (primary) hypertension: Secondary | ICD-10-CM | POA: Diagnosis not present

## 2023-01-23 DIAGNOSIS — E119 Type 2 diabetes mellitus without complications: Secondary | ICD-10-CM | POA: Insufficient documentation

## 2023-01-23 DIAGNOSIS — Z79899 Other long term (current) drug therapy: Secondary | ICD-10-CM | POA: Insufficient documentation

## 2023-01-23 DIAGNOSIS — R0789 Other chest pain: Secondary | ICD-10-CM | POA: Insufficient documentation

## 2023-01-23 DIAGNOSIS — R0781 Pleurodynia: Secondary | ICD-10-CM | POA: Diagnosis not present

## 2023-01-23 LAB — CBC WITH DIFFERENTIAL/PLATELET
Abs Immature Granulocytes: 0.01 10*3/uL (ref 0.00–0.07)
Basophils Absolute: 0 10*3/uL (ref 0.0–0.1)
Basophils Relative: 0 %
Eosinophils Absolute: 0.2 10*3/uL (ref 0.0–0.5)
Eosinophils Relative: 3 %
HCT: 34.7 % — ABNORMAL LOW (ref 36.0–46.0)
Hemoglobin: 11.4 g/dL — ABNORMAL LOW (ref 12.0–15.0)
Immature Granulocytes: 0 %
Lymphocytes Relative: 33 %
Lymphs Abs: 1.6 10*3/uL (ref 0.7–4.0)
MCH: 31.8 pg (ref 26.0–34.0)
MCHC: 32.9 g/dL (ref 30.0–36.0)
MCV: 96.9 fL (ref 80.0–100.0)
Monocytes Absolute: 0.4 10*3/uL (ref 0.1–1.0)
Monocytes Relative: 9 %
Neutro Abs: 2.6 10*3/uL (ref 1.7–7.7)
Neutrophils Relative %: 55 %
Platelets: 221 10*3/uL (ref 150–400)
RBC: 3.58 MIL/uL — ABNORMAL LOW (ref 3.87–5.11)
RDW: 13.2 % (ref 11.5–15.5)
WBC: 4.8 10*3/uL (ref 4.0–10.5)
nRBC: 0 % (ref 0.0–0.2)

## 2023-01-23 LAB — TROPONIN I (HIGH SENSITIVITY)
Troponin I (High Sensitivity): 27 ng/L — ABNORMAL HIGH (ref <18)
Troponin I (High Sensitivity): 30 ng/L — ABNORMAL HIGH (ref <18)

## 2023-01-23 LAB — BASIC METABOLIC PANEL
Anion gap: 7 (ref 5–15)
BUN: 13 mg/dL (ref 8–23)
CO2: 24 mmol/L (ref 22–32)
Calcium: 9.5 mg/dL (ref 8.9–10.3)
Chloride: 107 mmol/L (ref 98–111)
Creatinine, Ser: 1.1 mg/dL — ABNORMAL HIGH (ref 0.44–1.00)
GFR, Estimated: 57 mL/min — ABNORMAL LOW (ref >60)
Glucose, Bld: 112 mg/dL — ABNORMAL HIGH (ref 70–99)
Potassium: 4.1 mmol/L (ref 3.5–5.1)
Sodium: 138 mmol/L (ref 135–145)

## 2023-01-23 MED ORDER — IBUPROFEN 600 MG PO TABS: 600.0000 mg | ORAL_TABLET | Freq: Three times a day (TID) | ORAL | 0 refills | Status: DC

## 2023-01-23 MED ORDER — TRAMADOL HCL 50 MG PO TABS: 50.0000 mg | ORAL_TABLET | Freq: Four times a day (QID) | ORAL | 0 refills | Status: DC | PRN

## 2023-01-23 MED ORDER — ONDANSETRON HCL 4 MG PO TABS: 4.0000 mg | ORAL_TABLET | Freq: Four times a day (QID) | ORAL | 0 refills | Status: DC

## 2023-01-23 NOTE — ED Triage Notes (Signed)
Pt c/o pain under right rib cage area x 2 weeks. Pt reports yesterday she started having intermittent sharp bilateral arm pain and shoulder pain that lasts for about 2 minutes at a time.

## 2023-01-23 NOTE — ED Notes (Signed)
Patient requesting water at this time, PA made aware.

## 2023-01-23 NOTE — ED Provider Notes (Signed)
[place] EMERGENCY DEPARTMENT AT [facility] Provider Note   CSN: [id number] Arrival date & time: 01/23/23  2841     History  No chief complaint on file.   Becky [name] is a 63 y.o. female with a history significant for hypertension, hypercholesterolemia, diabetes, paroxysmal A-fib on Xarelto presents for a 2-week history of right sided chest pain after being treated for a URI with a course of steroids and Augmentin.  She states she had cough, congestion consistent with a viral URI last week, symptoms have improved but continues to have pain in her right chest just underneath her breast which is worse with movement, palpation and deep inspiration.  She denies shortness of breath or any continued fevers or chills.  She also has noted intermittent bilateral shoulder pain since yesterday, stating she had several episodes of sharp stabs of pain in the right shoulder, now today had a similar sharp pain in her left shoulder, fleeting, the symptoms are currently resolved.  She denies palpitations, shortness of breath, fevers or chills, diaphoresis.  She has applied Vicks and heat to the site of  pain in her right chest wall without improvement.   The history is provided by the patient.       Home Medications Prior to Admission medications   Medication Sig Start Date End Date Taking? Authorizing Provider  albuterol (VENTOLIN HFA) 108 (90 Base) MCG/ACT inhaler Inhale 2 puffs into the lungs every 4 (four) hours as needed for wheezing or shortness of breath. 12/07/21  Yes Particia Nearing, PA-C  carboxymethylcellulose (REFRESH PLUS) 0.5 % SOLN Place 1 drop into both eyes daily as needed (irritation).   Yes [provider]  Cholecalciferol (VITAMIN D-3 PO) Take 1 tablet by mouth 3 (three) times a week.   Yes [provider]  diltiazem (CARDIZEM CD) 360 MG 24 hr capsule TAKE 1 CAPSULE BY MOUTH EVERY DAY 12/10/22  Yes Branch, Dorothe Pea, MD  EPINEPHrine 0.3 mg/0.3  mL IJ SOAJ injection Inject 0.3 mg into the muscle once as needed for anaphylaxis. 04/20/18  Yes [provider]  fluticasone (FLONASE) 50 MCG/ACT nasal spray Place 2 sprays into both nostrils daily. Patient taking differently: Place 2 sprays into both nostrils daily as needed for allergies. 02/02/20  Yes Domenick Gong, MD  furosemide (LASIX) 40 MG tablet Take 1 tablet (40 mg total) by mouth daily as needed. Patient taking differently: Take 40 mg by mouth daily as needed for fluid. 09/13/21  Yes Branch, Dorothe Pea, MD  glimepiride (AMARYL) 2 MG tablet Take 2 mg by mouth daily with breakfast.   Yes [provider]  Lifitegrast (XIIDRA) 5 % SOLN Place 1 drop into both eyes 2 (two) times daily as needed (dry/irritated eyes.).   Yes [provider]  lubiprostone (AMITIZA) 24 MCG capsule TAKE 1 CAPSULE (24 MCG TOTAL) BY MOUTH 2 (TWO) TIMES DAILY WITH A MEAL. Patient taking differently: Take 24 mcg by mouth every other day. 06/12/21  Yes Gelene Mink, NP  metoprolol tartrate (LOPRESSOR) 50 MG tablet TAKE 1 TABLET BY MOUTH TWICE A DAY 12/25/22  Yes Branch, Dorothe Pea, MD  NON FORMULARY Pt uses a cpap nightly   Yes [provider]  olmesartan (BENICAR) 40 MG tablet Take 1 tablet (40 mg total) by mouth daily. 04/06/20  Yes Branch, Dorothe Pea, MD  ondansetron (ZOFRAN) 4 MG tablet Take 1 tablet (4 mg total) by mouth every 6 (six) hours. 01/23/23  Yes IdolRaynelle Fanning, PA-C  oxyCODONE-acetaminophen (PERCOCET/ROXICET)  5-325 MG tablet Take 1 tablet by mouth every 4 (four) hours as needed. 09/12/22  Yes [provider]  pantoprazole (PROTONIX) 40 MG tablet TAKE 1 TABLET (40 MG TOTAL) BY MOUTH DAILY TAKE 30 MINUTES BEFORE BREAKFAST 01/22/23  Yes Gelene Mink, NP  rivaroxaban (XARELTO) 20 MG TABS tablet Take 1 tablet (20 mg total) by mouth daily. 12/08/22  Yes Antoine Poche, MD  rosuvastatin (CRESTOR) 10 MG tablet TAKE 1 TABLET BY MOUTH EVERY DAY 09/17/22  Yes Antoine Poche,  MD  spironolactone (ALDACTONE) 25 MG tablet TAKE 1 TABLET BY MOUTH EVERY DAY 01/22/23  Yes Branch, Dorothe Pea, MD  SYNTHROID 25 MCG tablet Take 25 mcg by mouth daily. 09/25/22  Yes [provider]  tizanidine (ZANAFLEX) 2 MG capsule Take 1 capsule (2 mg total) by mouth 3 (three) times daily as needed for muscle spasms. Do not drink alcohol or drive while taking this medication.  May cause drowsiness. 11/07/22  Yes Particia Nearing, PA-C  traMADol (ULTRAM) 50 MG tablet Take 1 tablet (50 mg total) by mouth every 6 (six) hours as needed. 01/23/23  Yes Margan Elias, Raynelle Fanning, PA-C  ALPRAZolam Prudy Feeler) 0.5 MG tablet Take 0.5 mg by mouth 2 (two) times daily as needed. Patient not taking: Reported on 01/23/2023 03/06/22   [provider]  cyclobenzaprine (FLEXERIL) 10 MG tablet Take 10 mg by mouth every 6 (six) hours as needed. Patient not taking: Reported on 01/23/2023 09/12/22   [provider]  fluconazole (DIFLUCAN) 150 MG tablet TAKE 1 NOW AND 1 IN 3 DAYS Patient not taking: Reported on 01/23/2023 01/22/23   Adline Potter, NP  KLOR-CON M20 20 MEQ tablet Take 20 mEq by mouth daily. Patient not taking: Reported on 01/23/2023 08/28/22   [provider]  magnesium oxide (MAG-OX) 400 (240 Mg) MG tablet Take 400 mg by mouth daily. Patient not taking: Reported on 01/23/2023    [provider]  methylPREDNISolone (MEDROL DOSEPAK) 4 MG TBPK tablet Take 6 tabs day one, 5 tabs day two, 4 tabs day three, etc Patient not taking: Reported on 12/23/2022 11/07/22   Particia Nearing, PA-C  montelukast (SINGULAIR) 10 MG tablet Take 10 mg by mouth at bedtime. 08/25/16   [provider]  oxybutynin (DITROPAN-XL) 10 MG 24 hr tablet TAKE 1 TABLET BY MOUTH EVERY DAY Patient not taking: Reported on 01/23/2023 12/10/22   Cyril Mourning A, NP  zinc gluconate 50 MG tablet Take 50 mg by mouth daily. Patient not taking: Reported on 12/23/2022    [provider]       Allergies    Apresoline [hydralazine], Latex, Other, and Prednisone    Review of Systems   Review of Systems  Constitutional:  Negative for chills, diaphoresis and fever.  HENT:  Negative for congestion and sore throat.   Eyes: Negative.   Respiratory:  Negative for chest tightness and shortness of breath.   Cardiovascular:  Positive for chest pain. Negative for palpitations and leg swelling.       States she generally can tell when she is in A-fib as it affects her breathing, she does not experience palpitations  Gastrointestinal:  Negative for abdominal pain, nausea and vomiting.  Genitourinary: Negative.   Musculoskeletal:  Positive for arthralgias. Negative for joint swelling and neck pain.  Skin: Negative.  Negative for rash and wound.  Neurological:  Negative for dizziness, weakness, light-headedness, numbness and headaches.  Psychiatric/Behavioral: Negative.    All other systems reviewed and are  negative.   Physical Exam Updated Vital Signs BP (!) 150/82   Pulse 87   Temp 98.2 F (36.8 C) (Oral)   Resp 15   Ht 5\' 7"  (1.702 m)   Wt 123.4 kg   SpO2 94%   BMI 42.60 kg/m  Physical Exam Vitals and nursing note reviewed.  Constitutional:      Appearance: She is well-developed.  HENT:     Head: Normocephalic and atraumatic.  Eyes:     Conjunctiva/sclera: Conjunctivae normal.  Cardiovascular:     Rate and Rhythm: Normal rate and regular rhythm.     Heart sounds: Normal heart sounds.  Pulmonary:     Effort: Pulmonary effort is normal.     Breath sounds: Normal breath sounds. No wheezing.  Chest:     Chest wall: Tenderness present. No deformity, swelling or crepitus.    Abdominal:     General: Bowel sounds are normal.     Palpations: Abdomen is soft.     Tenderness: There is no abdominal tenderness. There is no guarding.  Musculoskeletal:        General: Normal range of motion.     Cervical back: Normal range of motion.  Skin:    General: Skin is warm and  dry.  Neurological:     Mental Status: She is alert.     ED Results / Procedures / Treatments   Labs (all labs ordered are listed, but only abnormal results are displayed) Labs Reviewed  CBC WITH DIFFERENTIAL/PLATELET - Abnormal; Notable for the following components:      Result Value   RBC 3.58 (*)    Hemoglobin 11.4 (*)    HCT 34.7 (*)    All other components within normal limits  BASIC METABOLIC PANEL - Abnormal; Notable for the following components:   Glucose, Bld 112 (*)    Creatinine, Ser 1.10 (*)    GFR, Estimated 57 (*)    All other components within normal limits  TROPONIN I (HIGH SENSITIVITY) - Abnormal; Notable for the following components:   Troponin I (High Sensitivity) 27 (*)    All other components within normal limits  TROPONIN I (HIGH SENSITIVITY) - Abnormal; Notable for the following components:   Troponin I (High Sensitivity) 30 (*)    All other components within normal limits    EKG EKG Interpretation Date/Time:  Friday January 23 2023 10:46:49 EST Ventricular Rate:  66 PR Interval:  226 QRS Duration:  116 QT Interval:  438 QTC Calculation: 459 R Axis:   84  Text Interpretation: Sinus rhythm Prolonged PR interval Nonspecific intraventricular conduction delay Nonspecific T abnrm, anterolateral leads Confirmed by Estanislado Pandy [phone]) on 01/23/2023 1:42:19 PM  Radiology DG Ribs Unilateral W/Chest Right Result Date: 01/23/2023 CLINICAL DATA:  Right-sided rib pain. EXAM: RIGHT RIBS AND CHEST - 3+ VIEW COMPARISON:  None Available. FINDINGS: Bilateral lung fields are clear. Bilateral costophrenic angles are clear. Normal cardio-mediastinal silhouette. No acute osseous abnormalities. No acute rib fracture or focal rib lesion. The soft tissues are within normal limits. IMPRESSION: *No acute cardiopulmonary abnormality. *No acute rib fracture or focal rib lesion. Electronically Signed   By: Jules Schick M.D.   On: 01/23/2023 12:02    Procedures Procedures     Medications Ordered in ED Medications - No data to display  ED Course/ Medical Decision Making/ A&P             HEART Score: 4  Medical Decision Making Pt with reproducible right sided chest wall pain present for 2 weeks, with intermittent fleeting shoulder pain for the past few days.  Reproducible,  occurring after uri illness.  No sob and vss without tachycardia or hypoxia - doubt PE, pneumonia, doubt ACS given reproducible nature of sx and chronicity, low flat troponins. Hx and exam most c/w  chest wall pain/costrochondritis.    Amount and/or Complexity of Data Reviewed Labs: ordered.    Details: Hgb 11.4 - stable,  flat delta trop 27 and 30.  Radiology: ordered.    Details: Cxr negative for acute cardiopulmonary disease.  Risk Prescription drug management.           Final Clinical Impression(s) / ED Diagnoses Final diagnoses:  Chest wall pain    Rx / DC Orders ED Discharge Orders          Ordered    ibuprofen (ADVIL) 600 MG tablet  3 times daily,   Status:  Discontinued        01/23/23 1534    traMADol (ULTRAM) 50 MG tablet  Every 6 hours PRN        01/23/23 1559    ondansetron (ZOFRAN) 4 MG tablet  Every 6 hours        01/23/23 1559              Burgess Amor, PA-C 01/23/23 1955    Coral Spikes, DO 01/26/23 1505

## 2023-01-23 NOTE — Discharge Instructions (Addendum)
Your lab tests, imaging, EKG and exam are reassuring today and suggests this may be inflammation of your chest wall causing your symptoms. I have prescribed some tramadol for you to use as needed for pain along with zofran since this makes you nauseated.   I also recommend you use a heating pad 20 minutes several times daily.  Plan seeing your MD for a recheck if your symptoms are not improving over the next week.

## 2023-01-23 NOTE — ED Notes (Signed)
Pt provided with ginger ale per request

## 2023-01-26 ENCOUNTER — Telehealth: Payer: Self-pay | Admitting: Cardiology

## 2023-01-26 NOTE — Telephone Encounter (Signed)
Pt c/o Shortness Of Breath: STAT if SOB developed within the last 24 hours or pt is noticeably SOB on the phone  1. Are you currently SOB (can you hear that pt is SOB on the phone)? no  2. How long have you been experiencing SOB? 3 weeks  3. Are you SOB when sitting or when up moving around? both  4. Are you currently experiencing any other symptoms? Sharp pain under right rib .Pt went to ER at [facility] on Friday.

## 2023-01-26 NOTE — Telephone Encounter (Signed)
Patient c/o SOB off/on x 2-3 weeks.  Recent ED visit on 01/23/23 did not show afib, HR of 66 at that time.  States that she notices the sob more when her afib is bothering her.

## 2023-01-27 ENCOUNTER — Other Ambulatory Visit (HOSPITAL_COMMUNITY): Payer: Self-pay | Admitting: Internal Medicine

## 2023-01-27 ENCOUNTER — Other Ambulatory Visit (HOSPITAL_COMMUNITY)
Admission: RE | Admit: 2023-01-27 | Discharge: 2023-01-27 | Disposition: A | Discharge status: Home / Self Care | Payer: BC Managed Care – PPO | Source: Ambulatory Visit | Attending: Internal Medicine | Admitting: Internal Medicine

## 2023-01-27 DIAGNOSIS — I10 Essential (primary) hypertension: Secondary | ICD-10-CM | POA: Diagnosis not present

## 2023-01-27 DIAGNOSIS — M159 Polyosteoarthritis, unspecified: Secondary | ICD-10-CM | POA: Diagnosis not present

## 2023-01-27 DIAGNOSIS — I4891 Unspecified atrial fibrillation: Secondary | ICD-10-CM | POA: Diagnosis not present

## 2023-01-27 DIAGNOSIS — A6922 Other neurologic disorders in Lyme disease: Secondary | ICD-10-CM | POA: Diagnosis not present

## 2023-01-27 DIAGNOSIS — R748 Abnormal levels of other serum enzymes: Secondary | ICD-10-CM | POA: Insufficient documentation

## 2023-01-27 DIAGNOSIS — E1165 Type 2 diabetes mellitus with hyperglycemia: Secondary | ICD-10-CM | POA: Diagnosis not present

## 2023-01-27 DIAGNOSIS — M940 Chondrocostal junction syndrome [Tietze]: Secondary | ICD-10-CM | POA: Diagnosis not present

## 2023-01-27 DIAGNOSIS — M5416 Radiculopathy, lumbar region: Secondary | ICD-10-CM | POA: Diagnosis not present

## 2023-01-27 DIAGNOSIS — Z6841 Body Mass Index (BMI) 40.0 and over, adult: Secondary | ICD-10-CM | POA: Diagnosis not present

## 2023-01-27 LAB — COMPREHENSIVE METABOLIC PANEL
ALT: 16 U/L (ref 0–44)
AST: 15 U/L (ref 15–41)
Albumin: 4.1 g/dL (ref 3.5–5.0)
Alkaline Phosphatase: 66 U/L (ref 38–126)
Anion gap: 9 (ref 5–15)
BUN: 18 mg/dL (ref 8–23)
CO2: 24 mmol/L (ref 22–32)
Calcium: 9.9 mg/dL (ref 8.9–10.3)
Chloride: 106 mmol/L (ref 98–111)
Creatinine, Ser: 1.38 mg/dL — ABNORMAL HIGH (ref 0.44–1.00)
GFR, Estimated: 44 mL/min — ABNORMAL LOW (ref >60)
Glucose, Bld: 105 mg/dL — ABNORMAL HIGH (ref 70–99)
Potassium: 4.5 mmol/L (ref 3.5–5.1)
Sodium: 139 mmol/L (ref 135–145)
Total Bilirubin: 0.7 mg/dL (ref 0.0–1.2)
Total Protein: 6.8 g/dL (ref 6.5–8.1)

## 2023-01-27 LAB — CBC WITH DIFFERENTIAL/PLATELET
Abs Immature Granulocytes: 0.01 10*3/uL (ref 0.00–0.07)
Basophils Absolute: 0 10*3/uL (ref 0.0–0.1)
Basophils Relative: 0 %
Eosinophils Absolute: 0.1 10*3/uL (ref 0.0–0.5)
Eosinophils Relative: 3 %
HCT: 35 % — ABNORMAL LOW (ref 36.0–46.0)
Hemoglobin: 11.6 g/dL — ABNORMAL LOW (ref 12.0–15.0)
Immature Granulocytes: 0 %
Lymphocytes Relative: 33 %
Lymphs Abs: 1.8 10*3/uL (ref 0.7–4.0)
MCH: 32.2 pg (ref 26.0–34.0)
MCHC: 33.1 g/dL (ref 30.0–36.0)
MCV: 97.2 fL (ref 80.0–100.0)
Monocytes Absolute: 0.6 10*3/uL (ref 0.1–1.0)
Monocytes Relative: 11 %
Neutro Abs: 2.9 10*3/uL (ref 1.7–7.7)
Neutrophils Relative %: 53 %
Platelets: 233 10*3/uL (ref 150–400)
RBC: 3.6 MIL/uL — ABNORMAL LOW (ref 3.87–5.11)
RDW: 13.1 % (ref 11.5–15.5)
WBC: 5.5 10*3/uL (ref 4.0–10.5)
nRBC: 0 % (ref 0.0–0.2)

## 2023-01-27 LAB — VITAMIN D 25 HYDROXY (VIT D DEFICIENCY, FRACTURES): Vit D, 25-Hydroxy: 33.08 ng/mL (ref 30–100)

## 2023-01-27 LAB — VITAMIN B12: Vitamin B-12: 240 pg/mL (ref 180–914)

## 2023-01-27 LAB — FOLATE: Folate: 9.2 ng/mL (ref >5.9)

## 2023-01-27 LAB — TSH: TSH: 2.499 u[IU]/mL (ref 0.350–4.500)

## 2023-01-27 NOTE — Telephone Encounter (Signed)
Appointment scheduled for 02/11/2023 with Randall An, PA in [place] office.  Will also place her on wait list.

## 2023-01-29 DIAGNOSIS — M25611 Stiffness of right shoulder, not elsewhere classified: Secondary | ICD-10-CM | POA: Diagnosis not present

## 2023-01-29 DIAGNOSIS — M25511 Pain in right shoulder: Secondary | ICD-10-CM | POA: Diagnosis not present

## 2023-02-02 ENCOUNTER — Ambulatory Visit (HOSPITAL_COMMUNITY): Payer: BC Managed Care – PPO

## 2023-02-02 ENCOUNTER — Encounter (HOSPITAL_COMMUNITY): Payer: Self-pay

## 2023-02-04 DIAGNOSIS — G4733 Obstructive sleep apnea (adult) (pediatric): Secondary | ICD-10-CM | POA: Diagnosis not present

## 2023-02-04 DIAGNOSIS — Z20828 Contact with and (suspected) exposure to other viral communicable diseases: Secondary | ICD-10-CM | POA: Diagnosis not present

## 2023-02-04 DIAGNOSIS — E6609 Other obesity due to excess calories: Secondary | ICD-10-CM | POA: Diagnosis not present

## 2023-02-04 DIAGNOSIS — R6889 Other general symptoms and signs: Secondary | ICD-10-CM | POA: Diagnosis not present

## 2023-02-04 DIAGNOSIS — A493 Mycoplasma infection, unspecified site: Secondary | ICD-10-CM | POA: Diagnosis not present

## 2023-02-04 DIAGNOSIS — Z6839 Body mass index (BMI) 39.0-39.9, adult: Secondary | ICD-10-CM | POA: Diagnosis not present

## 2023-02-09 DIAGNOSIS — A493 Mycoplasma infection, unspecified site: Secondary | ICD-10-CM | POA: Diagnosis not present

## 2023-02-11 ENCOUNTER — Ambulatory Visit (HOSPITAL_COMMUNITY)
Admission: RE | Admit: 2023-02-11 | Discharge: 2023-02-11 | Disposition: A | Discharge status: Home / Self Care | Payer: BC Managed Care – PPO | Source: Ambulatory Visit | Attending: Student | Admitting: Student

## 2023-02-11 ENCOUNTER — Encounter: Payer: Self-pay | Admitting: Student

## 2023-02-11 ENCOUNTER — Telehealth: Payer: Self-pay | Admitting: Family Medicine

## 2023-02-11 ENCOUNTER — Ambulatory Visit: Payer: BC Managed Care – PPO | Attending: Student | Admitting: Student

## 2023-02-11 VITALS — BP 98/48 | HR 80 | Ht 67.0 in | Wt 262.0 lb

## 2023-02-11 DIAGNOSIS — G4733 Obstructive sleep apnea (adult) (pediatric): Secondary | ICD-10-CM

## 2023-02-11 DIAGNOSIS — R0989 Other specified symptoms and signs involving the circulatory and respiratory systems: Secondary | ICD-10-CM | POA: Diagnosis not present

## 2023-02-11 DIAGNOSIS — I10 Essential (primary) hypertension: Secondary | ICD-10-CM

## 2023-02-11 DIAGNOSIS — I480 Paroxysmal atrial fibrillation: Secondary | ICD-10-CM | POA: Diagnosis not present

## 2023-02-11 DIAGNOSIS — Z79899 Other long term (current) drug therapy: Secondary | ICD-10-CM

## 2023-02-11 DIAGNOSIS — I2510 Atherosclerotic heart disease of native coronary artery without angina pectoris: Secondary | ICD-10-CM

## 2023-02-11 DIAGNOSIS — R0781 Pleurodynia: Secondary | ICD-10-CM | POA: Diagnosis not present

## 2023-02-11 DIAGNOSIS — R0602 Shortness of breath: Secondary | ICD-10-CM

## 2023-02-11 DIAGNOSIS — R059 Cough, unspecified: Secondary | ICD-10-CM | POA: Diagnosis not present

## 2023-02-11 DIAGNOSIS — R748 Abnormal levels of other serum enzymes: Secondary | ICD-10-CM | POA: Insufficient documentation

## 2023-02-11 DIAGNOSIS — R918 Other nonspecific abnormal finding of lung field: Secondary | ICD-10-CM | POA: Diagnosis not present

## 2023-02-11 NOTE — Telephone Encounter (Signed)
 Please attach 90 day report

## 2023-02-11 NOTE — Telephone Encounter (Signed)
 Aaron Aas

## 2023-02-11 NOTE — Patient Instructions (Signed)
 Medication Instructions:   Do NOT take Lasix , Olmesartan  or Spironolactone  tonight. Call our office with blood pressure readings tomorrow before taking.   *If you need a refill on your cardiac medications before your next appointment, please call your pharmacy*   Lab Work:  BMET, BNP and CBC  If you have labs (blood work) drawn today and your tests are completely normal, you will receive your results only by: MyChart Message (if you have MyChart) OR A paper copy in the mail If you have any lab test that is abnormal or we need to change your treatment, we will call you to review the results.   Testing/Procedures:  Chest X-ray today at [facility]   Follow-Up: At [facility], you and your health needs are our priority.  As part of our continuing mission to provide you with exceptional heart care, we have created designated Provider Care Teams.  These Care Teams include your primary Cardiologist (physician) and Advanced Practice Providers (APPs -  Physician Assistants and Nurse Practitioners) who all work together to provide you with the care you need, when you need it.  We recommend signing up for the patient portal called MyChart.  Sign up information is provided on this After Visit Summary.  MyChart is used to connect with patients for Virtual Visits (Telemedicine).  Patients are able to view lab/test results, encounter notes, upcoming appointments, etc.  Non-urgent messages can be sent to your provider as well.   To learn more about what you can do with MyChart, go to [email or web address].    Your next appointment:   Keep follow-up with Dr. Alvan for 03/11/2023.

## 2023-02-11 NOTE — Telephone Encounter (Signed)
 Community message sent to [employer] that order placed.

## 2023-02-11 NOTE — Addendum Note (Signed)
 Addended byOla Berger, Rashard Ryle L on: 02/11/2023 11:05 AM   Modules accepted: Orders

## 2023-02-11 NOTE — Progress Notes (Signed)
 Cardiology Office Note    Date:  02/11/2023  ID:  Becky [name], DOB Jul 02, 1960, MRN [id number] Cardiologist: Becky Carrier, MD    History of Present Illness:    Becky [name] is a 63 y.o. female with past medical history of CAD (s/p cath in 07/2019 showing mild nonobstructive CAD), paroxysmal atrial fibrillation, HTN, HLD, history of GIB and OSA who presents to the office today for evaluation of shortness of breath.  She was last examined by Dr. Alvan in 09/2022 and was overall doing well on Xarelto  at that time without any recurrent episodes of GI bleeding. Had previously met with Dr. Cindie to discuss Watchman device placement but this was on hold given no recurrent bleeding issues. No changes were made to her cardiac medications and she was informed to follow-up in 6 months.  In the interim, she did present to [facility] ED on 01/23/2023 for evaluation of right sided chest discomfort. Was being treated for a URI with steroids and Augmentin  at that time. Pain was worse with movement and inspiration, overall felt to be pleuritic. Hs troponin values were flat at 27 and 30 and she was discharged home and encouraged to take Tramadol  and Ibuprofen  as needed for chest wall pain/costochondritis. She did call the office for follow-up given shortness of breath and a visit was arranged.  In talking with the patient and her daughter today, she reports having a viral illness for the past 3 to 4 weeks. She has been evaluated by her PCP twice for this and was started on antibiotics and a steroid taper and then later received a steroid shot. Was also evaluated in the ED last month as discussed above. She continues to have intermittent dyspnea and a productive cough. Also notes generalized weakness. BP is soft at 98/48 during today's visit she has not yet taken her medications. She reports having chest discomfort underneath her right breast and this occurs with inspiration or coughing. No left-sided  pain and this has not been associated with exertion. Her pain has been constant now for over 2 weeks. No specific orthopnea, PND or pitting edema.  Studies Reviewed:   EKG: EKG is not ordered today. EKG from 01/23/2023 is reviewed and shows NSR, HR 66 with no acute ST changes when compared to prior tracings.   Cardiac Catheterization: 07/2019 Prox RCA to Mid RCA lesion is 25% stenosed. Prox LAD to Mid LAD lesion is 25% stenosed. The left ventricular systolic function is normal. The left ventricular ejection fraction is 55-65% by visual estimate. LV end diastolic pressure is normal. LVEDP 15 mm Hg. There is no aortic valve stenosis.   Continue medical therapy.     Of note, patient was in AFib during the cath.  Rate was controlled and she did not feel palpitations.   Echocardiogram: 03/2021 IMPRESSIONS     1. Left ventricular ejection fraction, by estimation, is 60 to 65%. Left  ventricular ejection fraction by 3D volume is 65 %. The left ventricle has  normal function. The left ventricle has no regional wall motion  abnormalities. There is mild concentric  left ventricular hypertrophy. Left ventricular diastolic parameters were  normal. The average left ventricular global longitudinal strain is -21.6  %. The global longitudinal strain is normal.   2. Right ventricular systolic function is normal. The right ventricular  size is normal.   3. Left atrial size was mildly dilated.   4. The mitral valve is normal in structure. Trivial mitral valve  regurgitation.  5. The aortic valve is tricuspid. There is mild thickening of the aortic  valve. Aortic valve regurgitation is not visualized. Aortic valve  sclerosis is present, with no evidence of aortic valve stenosis.   6. The inferior vena cava is normal in size with greater than 50%  respiratory variability, suggesting right atrial pressure of 3 mmHg.   Comparison(s): No significant change from prior study.    Risk  Assessment/Calculations:    CHA2DS2-VASc Score = 4   This indicates a 4.8% annual risk of stroke. The patient's score is based upon: CHF History: 0 HTN History: 1 Diabetes History: 1 Stroke History: 0 Vascular Disease History: 1 Age Score: 0 Gender Score: 1     Physical Exam:   VS:  BP (!) 98/48 (BP Location: Left Arm, Patient Position: Sitting, Cuff Size: Large)   Pulse 80   Ht 5' 7 (1.702 m)   Wt 262 lb (118.8 kg)   SpO2 96%   BMI 41.04 kg/m    Wt Readings from Last 3 Encounters:  02/11/23 262 lb (118.8 kg)  01/23/23 272 lb (123.4 kg)  01/20/23 272 lb 8 oz (123.6 kg)     GEN: Well nourished, well developed female appearing in no acute distress NECK: No JVD; No carotid bruits CARDIAC: RRR, no murmurs, rubs, gallops RESPIRATORY:  Scattered rhonchi. No wheezing or rales.  ABDOMEN: Appears non-distended. No obvious abdominal masses. EXTREMITIES: No clubbing or cyanosis. No pitting edema.  Distal pedal pulses are 2+ bilaterally.   Assessment and Plan:   1. Dyspnea/Pleuritic Chest Pain - She has been suffering from what sounds most consistent with a a viral URI for over 3 weeks. Was tested for Influenza and COVID by her PCP and negative per her report. Was treated with antibiotics and steroids but still having dyspnea and a productive cough. O2 saturations are appropriate at 96% on RA today. We discussed Urgent Care or Emergency Department evaluation as she was initially considering going after her visit today but wishes to hold off given the current wait times. Reviewed ED precautions and she will consider going tomorrow if symptoms do not improve. I do think her chest discomfort is more pleuritic and atypical for angina as symptoms are exacerbated with coughing and inspiration and have now been present for over 2 weeks. - Encouraged increased PO hydration. Will also temporarily hold diuretics as discussed below. Will recheck labs today to include CBC, BNP and BMET. Will also  obtain a repeat CXR.   2. Paroxysmal Atrial Fibrillation/Use of Long-term Anticoagulation - Maintaining NSR by examination today and was also in NSR by recent EKG last month. Does report occasional palpitations. Continue Cardizem  CD 360mg  daily and Lopressor  50mg  BID for rate-control.  -She remains on Xarelto  20 mg daily for anticoagulation and hemoglobin was stable at 11.4 when checked in 01/2023 and platelet count was at 221K.  3. CAD/HLD - Prior cardiac catheterization in 2021 showed mild, nonobstructive disease as outlined above. - LDL was 84 when checked in 08/2022 which is significantly improved from 174 in 2023. Continue current medical therapy with Crestor  10 mg daily. Not on ASA given the need for anticoagulation.   4. HTN - BP is soft at 98/48 during today's visit. I suspect a component of this could be dehydration given her decreased oral intake since being sick. I have asked her to hold Lasix , Spironolactone  and Olmesartan  tonight and to call with BP readings tomorrow before taking her medications.   5. OSA - She was previously  using her CPAP consistently but has been off this for the past few weeks given her URI.    Signed, Laymon CHRISTELLA Qua, PA-C

## 2023-02-12 ENCOUNTER — Other Ambulatory Visit (HOSPITAL_COMMUNITY)
Admission: RE | Admit: 2023-02-12 | Discharge: 2023-02-12 | Disposition: A | Discharge status: Home / Self Care | Payer: BC Managed Care – PPO | Source: Ambulatory Visit | Attending: Student | Admitting: Student

## 2023-02-12 ENCOUNTER — Ambulatory Visit (HOSPITAL_COMMUNITY)
Admission: RE | Admit: 2023-02-12 | Discharge: 2023-02-12 | Disposition: A | Discharge status: Home / Self Care | Payer: BC Managed Care – PPO | Source: Ambulatory Visit | Attending: Internal Medicine | Admitting: Internal Medicine

## 2023-02-12 DIAGNOSIS — R748 Abnormal levels of other serum enzymes: Secondary | ICD-10-CM

## 2023-02-12 DIAGNOSIS — N281 Cyst of kidney, acquired: Secondary | ICD-10-CM | POA: Diagnosis not present

## 2023-02-12 DIAGNOSIS — Z79899 Other long term (current) drug therapy: Secondary | ICD-10-CM

## 2023-02-12 DIAGNOSIS — R0602 Shortness of breath: Secondary | ICD-10-CM

## 2023-02-12 LAB — CBC
HCT: 36.8 % (ref 36.0–46.0)
Hemoglobin: 12.3 g/dL (ref 12.0–15.0)
MCH: 31.6 pg (ref 26.0–34.0)
MCHC: 33.4 g/dL (ref 30.0–36.0)
MCV: 94.6 fL (ref 80.0–100.0)
Platelets: 262 10*3/uL (ref 150–400)
RBC: 3.89 MIL/uL (ref 3.87–5.11)
RDW: 12.8 % (ref 11.5–15.5)
WBC: 6.6 10*3/uL (ref 4.0–10.5)
nRBC: 0 % (ref 0.0–0.2)

## 2023-02-12 LAB — BASIC METABOLIC PANEL
Anion gap: 8 (ref 5–15)
BUN: 17 mg/dL (ref 8–23)
CO2: 23 mmol/L (ref 22–32)
Calcium: 9.1 mg/dL (ref 8.9–10.3)
Chloride: 107 mmol/L (ref 98–111)
Creatinine, Ser: 1.24 mg/dL — ABNORMAL HIGH (ref 0.44–1.00)
GFR, Estimated: 50 mL/min — ABNORMAL LOW (ref >60)
Glucose, Bld: 127 mg/dL — ABNORMAL HIGH (ref 70–99)
Potassium: 4.4 mmol/L (ref 3.5–5.1)
Sodium: 138 mmol/L (ref 135–145)

## 2023-02-12 LAB — BRAIN NATRIURETIC PEPTIDE: B Natriuretic Peptide: 80 pg/mL (ref 0.0–100.0)

## 2023-02-17 ENCOUNTER — Encounter: Payer: Self-pay | Admitting: *Deleted

## 2023-02-17 NOTE — Progress Notes (Signed)
[facility] PREP Weekly Session  Patient Details  Name: Becky [name] MRN: [id number] Date of Birth: [birth year] Age: 63 y.o. PCP: Elfredia Nevins, MD  There were no vitals filed for this visit.   [facility] Weekly seesion - 02/17/23 1500       [facility] "PREP" Location   [facility] "PREP" Location [facility]      Weekly Session   Topic Discussed Hitting roadblocks   Tips to stay on track and remain motivated, Fit and Strong survey, [employer] talk by Irving Burton, Final FIT Testing   Classes attended to date 82             Remo Lipps 02/17/2023, 4:10 PM

## 2023-02-24 ENCOUNTER — Encounter: Payer: Self-pay | Admitting: *Deleted

## 2023-02-24 ENCOUNTER — Telehealth: Payer: Self-pay | Admitting: *Deleted

## 2023-02-24 DIAGNOSIS — M159 Polyosteoarthritis, unspecified: Secondary | ICD-10-CM | POA: Diagnosis not present

## 2023-02-24 DIAGNOSIS — U071 COVID-19: Secondary | ICD-10-CM | POA: Diagnosis not present

## 2023-02-24 DIAGNOSIS — R6889 Other general symptoms and signs: Secondary | ICD-10-CM | POA: Diagnosis not present

## 2023-02-24 DIAGNOSIS — I4891 Unspecified atrial fibrillation: Secondary | ICD-10-CM | POA: Diagnosis not present

## 2023-02-24 DIAGNOSIS — E1165 Type 2 diabetes mellitus with hyperglycemia: Secondary | ICD-10-CM | POA: Diagnosis not present

## 2023-02-24 DIAGNOSIS — Z6839 Body mass index (BMI) 39.0-39.9, adult: Secondary | ICD-10-CM | POA: Diagnosis not present

## 2023-02-24 DIAGNOSIS — A6922 Other neurologic disorders in Lyme disease: Secondary | ICD-10-CM | POA: Diagnosis not present

## 2023-02-24 DIAGNOSIS — I10 Essential (primary) hypertension: Secondary | ICD-10-CM | POA: Diagnosis not present

## 2023-02-24 NOTE — Patient Outreach (Signed)
  Care Coordination   Follow Up Visit Note   02/24/2023 Name: Becky [name] MRN: [id number] DOB: 28-Aug-1960  Becky [name] is a 63 y.o. year old female who sees Elfredia Nevins, MD for primary care. I spoke with  [name] by phone today.  What matters to the patients health and wellness today?  Incoming call from patient regarding medication for Covid. She was prescribed Legavrio but was unable to afford the $300 cost. Medication was changed to Paxlovid, which her insurance covered.    Goals Addressed             This Visit's Progress    Resolution of Covid Infection & Prevention of Complications       Care Management Goals: Patient will take Paxlovid as prescribed Patient will drink plenty of liquids to thin secretions Patient will monitor oxygen level with pulse oximeter and will seek medication attention for readings less than 93% that do not improve with rest Patient will seek medical attention for any new, persistent, or worsening symptoms Patient will practice deep breathing exercises Patient will take short walks in her home every hour or two and get plenty of rest Patient will reach out to RN Care Manager at [phone] with any care management needs        SDOH assessments and interventions completed:  Yes  SDOH Interventions Today    Flowsheet Row Most Recent Value  SDOH Interventions   Transportation Interventions Intervention Not Indicated        Care Coordination Interventions:  Yes, provided  Interventions Today    Flowsheet Row Most Recent Value  Chronic Disease   Chronic disease during today's visit Other, Hypertension (HTN), Diabetes  [current Covid 19 Infection]  General Interventions   General Interventions Discussed/Reviewed General Interventions Discussed, General Interventions Reviewed, Sick Day Rules, Labs, Horticulturist, commercial (DME), Doctor Visits  Labs --  [Per patient she had a positive Covid 19 test at PCP office  yesterday]  Doctor Visits Discussed/Reviewed Doctor Visits Discussed, Doctor Visits Reviewed, PCP  Durable Medical Equipment (DME) Glucomoter, Other  [pulse oximeter]  PCP/Specialist Visits Compliance with follow-up visit  [Follow-up with PCP as recommended]  Exercise Interventions   Exercise Discussed/Reviewed Physical Activity  Physical Activity Discussed/Reviewed Physical Activity Reviewed, Physical Activity Discussed  [walk around home for a few minutes every hour or two. Get plenty of rest.]  Education Interventions   Education Provided Provided Education  Provided Verbal Education On Exercise, Nutrition, When to see the doctor, Sick Day Rules, Medication, Blood Sugar Monitoring  [monitoring SPO2 level with pulse oximeter, deep breathing exercises, seek medical attention for new, persistent, or worsening symptoms]  Nutrition Interventions   Nutrition Discussed/Reviewed Nutrition Discussed, Nutrition Reviewed, Fluid intake  [increase fluid intake to thin secretions]  Pharmacy Interventions   Pharmacy Dicussed/Reviewed Pharmacy Topics Discussed, Pharmacy Topics Reviewed, Medications and their functions  [taking paxlovid as prescribed. Picked it up today.]       Follow up plan: Follow up call scheduled for 02/26/23    Encounter Outcome:  Patient Visit Completed   Demetrios Loll, RN, BSN [place]  [facility], [facility] Health RN Care Manager Direct Dial: [phone]

## 2023-02-25 ENCOUNTER — Encounter: Payer: Self-pay | Admitting: *Deleted
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
## 2023-02-26 ENCOUNTER — Encounter: Payer: Self-pay | Admitting: *Deleted

## 2023-02-26 ENCOUNTER — Telehealth: Payer: Self-pay | Admitting: *Deleted

## 2023-02-26 DIAGNOSIS — R0602 Shortness of breath: Secondary | ICD-10-CM

## 2023-02-26 DIAGNOSIS — R9389 Abnormal findings on diagnostic imaging of other specified body structures: Secondary | ICD-10-CM

## 2023-02-26 NOTE — Telephone Encounter (Signed)
-----   Message from [place] sent at 02/26/2023 11:19 AM EST ----- Please let the patient know her CXR was read today and shows a residual airspace opacity along the right upper lobe. Was recommended by the Radiologist to further evaluate with a CT Chest with contrast. Can be ordered by [place] or she can follow-up with her PCP to review further (as it appears she was on medication for COVID by review of phone notes from earlier this week).

## 2023-03-04 ENCOUNTER — Ambulatory Visit: Payer: Self-pay | Admitting: *Deleted

## 2023-03-04 ENCOUNTER — Encounter: Payer: Self-pay | Admitting: *Deleted

## 2023-03-04 NOTE — Patient Outreach (Signed)
 Care Coordination   03/04/2023 Name: Becky [name] MRN: [id number] DOB: [birth year]   Care Coordination Outreach Attempts:  An unsuccessful outreach was attempted for an appointment today.  Follow Up Plan:  Additional outreach attempts will be made to offer the patient complex care management information and services.   Encounter Outcome:  No Answer   Care Coordination Interventions:  No, not indicated    Chart Review: CT scan ordered by cardiology department and scheduled for 05/14/23 to follow-up on right upper lobe airspace opacity.  Demetrios Loll, RN, BSN [place]  [facility], [facility] RN Care Manager Direct Dial: [phone]

## 2023-03-06 DIAGNOSIS — G4733 Obstructive sleep apnea (adult) (pediatric): Secondary | ICD-10-CM | POA: Diagnosis not present

## 2023-03-11 ENCOUNTER — Ambulatory Visit: Payer: BC Managed Care – PPO | Admitting: Cardiology

## 2023-03-17 ENCOUNTER — Telehealth: Payer: Self-pay | Admitting: Family Medicine

## 2023-03-17 NOTE — Telephone Encounter (Signed)
 Called pt. Pressure setting was increased by AL,NP 02/11/23 to 13cm. She didn't start using CPAP again until 1-2 wk ago d/t having Covid/pneumonia prior. She feels its too high of pressure. Feels she has some air leakage. Has mask that goes over mouth, under nose. Mouth feels dry. Aware we typically need at least 7 days consecutive data for provider to review/make recommendations. However, I will send what we have for Amy to look at and call back tomorrow w/ recommendation. Pt will try to use machine again tonight.

## 2023-03-17 NOTE — Telephone Encounter (Signed)
 LVM and sent mychart msg informing pt of need to reschedule 07/08/23 appt - NP out

## 2023-03-17 NOTE — Telephone Encounter (Signed)
 Pt reports that her CPAP is now blowing out too much air, she'd like a call to discuss.

## 2023-03-18 NOTE — Telephone Encounter (Signed)
 Called and spoke w/ pt. Relayed AL,NP message. She is agreeable to try and use consecutively for 7 or more days at current pressure setting of 13cm. She will call back and update on how things go.

## 2023-03-19 ENCOUNTER — Other Ambulatory Visit: Payer: Self-pay | Admitting: Cardiology

## 2023-03-23 ENCOUNTER — Ambulatory Visit: Payer: Self-pay | Admitting: *Deleted

## 2023-03-23 NOTE — Patient Outreach (Signed)
 Care Coordination   03/23/2023 Name: Becky [name] MRN: [id number] DOB: 1960/02/10   Care Coordination Outreach Attempts:  An unsuccessful outreach was attempted for an appointment today.  Follow Up Plan:  Additional outreach attempts will be made to offer the patient complex care management information and services.   Encounter Outcome:  No Answer. Left HIPAA compliant VM.   Care Coordination Interventions:  No, not indicated. Staff message sent to care guide requesting outreach and rescheduling.    Demetrios Loll, RN, BSN [place]  [facility], [facility] Health RN Care Manager Direct Dial: [phone]

## 2023-03-24 ENCOUNTER — Encounter: Payer: Self-pay | Admitting: *Deleted

## 2023-03-24 ENCOUNTER — Telehealth: Payer: Self-pay | Admitting: Family Medicine

## 2023-03-24 ENCOUNTER — Telehealth: Payer: Self-pay | Admitting: *Deleted

## 2023-03-24 ENCOUNTER — Encounter: Payer: Self-pay | Admitting: Family Medicine

## 2023-03-24 NOTE — Telephone Encounter (Signed)
 Marland Kitchen

## 2023-03-24 NOTE — Patient Outreach (Signed)
 Care Coordination   Follow Up Visit Note   03/24/2023 Name: Becky [name] MRN: [id number] DOB: July 06, 1960  Becky [name] is a 63 y.o. year old female who sees Elfredia Nevins, MD for primary care. I spoke with  Becky [name] by phone today.  What matters to the patients health and wellness today?  Following up on leg pain and dizziness.    Goals Addressed             This Visit's Progress    Care Management Needs       Care Management Goals: Patient will reach out to [facility] or [facility] to schedule an appointment for nerve conduction testing Referrals were sent to both practices from PCP office last week. She is established with the [facility]. Patient has not received a call from either Patient will keep appointment with cardiologist for tomorrow, 03/25/23  Patient will discuss random episodes of dizziness, lightheadedness, and double vision with cardiologist at appointment tomorrow Patient will follow-up with PCP as recommended and sooner if needed Patient will reach out to RN Care Manager at [phone] as needed     COMPLETED: Resolution of Covid Infection & Prevention of Complications   On track    Care Management Goals: Patient will drink plenty of liquids to thin secretions Patient will monitor oxygen level with pulse oximeter and will seek medication attention for readings less than 93% that do not improve with rest Patient will seek medical attention for any new, persistent, or worsening symptoms Patient will practice deep breathing exercises Patient will take short walks in her home every hour or two and get plenty of rest Patient will reach out to RN Care Manager at [phone] with any care management needs        SDOH assessments and interventions completed:  Yes  SDOH Interventions Today    Flowsheet Row Most Recent Value  SDOH Interventions   Transportation Interventions Intervention Not  Indicated  Financial Strain Interventions Intervention Not Indicated        Care Coordination Interventions:  Yes, provided  Interventions Today    Flowsheet Row Most Recent Value  Chronic Disease   Chronic disease during today's visit Diabetes, Other  [Covid Infection last month]  General Interventions   General Interventions Discussed/Reviewed General Interventions Discussed, General Interventions Reviewed, Doctor Visits  [Cough and respiratory symptoms associated with Covid have resolved. Continues to have some fatigue. Complains of bilateral leg pain, burning, and numbness in toes. No discoloration or temperature change. Also has episodes of dizziness and double vision.]  Doctor Visits Discussed/Reviewed Doctor Visits Discussed, Doctor Visits Reviewed, Specialist, PCP  PCP/Specialist Visits Compliance with follow-up visit  [Dr Branch (cardiology) 03/25/23, Lewie Loron, NP (Gastroenterology) 03/31/23. Referral to neurologist for nerve conduction testing. Scheduling pending.]  Exercise Interventions   Exercise Discussed/Reviewed Exercise Discussed, Exercise Reviewed, Physical Activity  Physical Activity Discussed/Reviewed Physical Activity Discussed, Physical Activity Reviewed  [able to perform ADLs independently. Walking daily. Trying to increase activity level since having Covid.]  Education Interventions   Education Provided Provided Education  Provided Verbal Education On When to see the doctor, Medication, Exercise, Mental Health/Coping with Illness  [Referrals to [facility] & [facility] have been scanned into Media. Pt is established with [facility]. Patient can call to schedule since they have the referrals. Seek medical attention for new or worsening symptoms.]  Mental Health Interventions   Mental Health Discussed/Reviewed Mental Health Discussed, Mental Health Reviewed  Pharmacy Interventions   Pharmacy  Dicussed/Reviewed Pharmacy Topics Discussed,  Pharmacy Topics Reviewed, Medications and their functions  [taking medications as prescribed. No concerns at this time.]  Safety Interventions   Safety Discussed/Reviewed Safety Discussed, Safety Reviewed, Fall Risk, Home Safety  [discussed increased risk for falls due to dizziness, leg pain, and numbness in toes. Fall prevention discussed.]       Follow up plan: Follow up call scheduled for 04/07/23    Encounter Outcome:  Patient Visit Completed   Becky Loll, RN, BSN [place]  [facility], [facility] RN Care Manager Direct Dial: [phone]

## 2023-03-24 NOTE — Telephone Encounter (Signed)
 Please attach 90 day report for review.

## 2023-03-24 NOTE — Telephone Encounter (Signed)
-----   Message from Gorman Safi sent at 02/11/2023 11:52 AM EST ----- Check AHI, increased pressure to 13, AHI was 11.9.

## 2023-03-24 NOTE — Patient Outreach (Signed)
 Care Coordination   03/24/2023 Name: Becky [name] MRN: [id number] DOB: 09-01-1960   Care Coordination Outreach Attempts:  Unsuccessful attempt to return patient call.   Follow Up Plan:  Additional outreach attempts will be made to offer the patient complex care management information and services.   Encounter Outcome:  No Answer. Left HIPAA compliant VM.   Care Coordination Interventions:  No, not indicated    Demetrios Loll, RN, BSN [place]  [facility], [facility] RN Care Manager Direct Dial: [phone]

## 2023-03-25 ENCOUNTER — Ambulatory Visit: Attending: Cardiology | Admitting: Cardiology

## 2023-03-25 ENCOUNTER — Encounter: Payer: Self-pay | Admitting: Cardiology

## 2023-03-25 VITALS — BP 132/60 | HR 64 | Ht 67.0 in | Wt 256.4 lb

## 2023-03-25 DIAGNOSIS — I480 Paroxysmal atrial fibrillation: Secondary | ICD-10-CM | POA: Diagnosis not present

## 2023-03-25 DIAGNOSIS — M79606 Pain in leg, unspecified: Secondary | ICD-10-CM | POA: Diagnosis not present

## 2023-03-25 DIAGNOSIS — D6869 Other thrombophilia: Secondary | ICD-10-CM | POA: Diagnosis not present
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
## 2023-03-25 NOTE — Patient Instructions (Signed)
 Medication Instructions:  Your physician has recommended you make the following change in your medication:   -Hold Xarelto for 3 days. If your bleeding issue resolves, you may restart Xarelto.   *If you need a refill on your cardiac medications before your next appointment, please call your pharmacy*   Lab Work: None If you have labs (blood work) drawn today and your tests are completely normal, you will receive your results only by: MyChart Message (if you have MyChart) OR A paper copy in the mail If you have any lab test that is abnormal or we need to change your treatment, we will call you to review the results.   Testing/Procedures: Your physician has requested that you have an ankle brachial index (ABI). During this test an ultrasound and blood pressure cuff are used to evaluate the arteries that supply the arms and legs with blood. Allow thirty minutes for this exam. There are no restrictions or special instructions.  Please note: We ask at that you not bring children with you during ultrasound (echo/ vascular) testing. Due to room size and safety concerns, children are not allowed in the ultrasound rooms during exams. Our front office staff cannot provide observation of children in our lobby area while testing is being conducted. An adult accompanying a patient to their appointment will only be allowed in the ultrasound room at the discretion of the ultrasound technician under special circumstances. We apologize for any inconvenience.    Follow-Up: At [facility], you and your health needs are our priority.  As part of our continuing mission to provide you with exceptional heart care, we have created designated Provider Care Teams.  These Care Teams include your primary Cardiologist (physician) and Advanced Practice Providers (APPs -  Physician Assistants and Nurse Practitioners) who all work together to provide you with the care you need, when you need it.  We recommend  signing up for the patient portal called "MyChart".  Sign up information is provided on this After Visit Summary.  MyChart is used to connect with patients for Virtual Visits (Telemedicine).  Patients are able to view lab/test results, encounter notes, upcoming appointments, etc.  Non-urgent messages can be sent to your provider as well.   To learn more about what you can do with MyChart, go to [email or web address].    Your next appointment:   4 week(s)  Provider:   You may see Sharlene Dory, NP  Other Instructions

## 2023-03-31 ENCOUNTER — Ambulatory Visit: Admitting: Gastroenterology

## 2023-04-02 ENCOUNTER — Telehealth: Payer: Self-pay | Admitting: Cardiology

## 2023-04-02 NOTE — Telephone Encounter (Signed)
 Patient was to hold Xarelto for 3 days and follow up with GI.

## 2023-04-02 NOTE — Telephone Encounter (Signed)
 Pt c/o medication issue:  1. Name of Medication:   rivaroxaban (XARELTO) 20 MG TABS tablet    2. How are you currently taking this medication (dosage and times per day)?   Take 1 tablet (20 mg total) by mouth daily.    3. Are you having a reaction (difficulty breathing--STAT)? No  4. What is your medication issue? Pt states that she was taken off medication due to GI bleeding. She would ike to know when she is able to start back taking medication. Please advise

## 2023-04-03 NOTE — Telephone Encounter (Signed)
 Patient informed and verbalized understanding of plan. Patient is going to follow up with GI regarding restarting medication.

## 2023-04-05 ENCOUNTER — Encounter (HOSPITAL_COMMUNITY): Payer: Self-pay

## 2023-04-05 ENCOUNTER — Other Ambulatory Visit: Payer: Self-pay

## 2023-04-05 ENCOUNTER — Emergency Department (HOSPITAL_COMMUNITY)
Admission: EM | Admit: 2023-04-05 | Discharge: 2023-04-05 | Disposition: A | Discharge status: Home / Self Care | Source: Home / Self Care | Attending: Emergency Medicine | Admitting: Emergency Medicine

## 2023-04-05 DIAGNOSIS — Z7901 Long term (current) use of anticoagulants: Secondary | ICD-10-CM | POA: Insufficient documentation

## 2023-04-05 DIAGNOSIS — K625 Hemorrhage of anus and rectum: Secondary | ICD-10-CM | POA: Insufficient documentation

## 2023-04-05 DIAGNOSIS — Z7984 Long term (current) use of oral hypoglycemic drugs: Secondary | ICD-10-CM | POA: Insufficient documentation

## 2023-04-05 DIAGNOSIS — E119 Type 2 diabetes mellitus without complications: Secondary | ICD-10-CM | POA: Insufficient documentation

## 2023-04-05 LAB — COMPREHENSIVE METABOLIC PANEL WITH GFR
ALT: 16 U/L (ref 0–44)
AST: 17 U/L (ref 15–41)
Albumin: 4.1 g/dL (ref 3.5–5.0)
Alkaline Phosphatase: 64 U/L (ref 38–126)
Anion gap: 9 (ref 5–15)
BUN: 17 mg/dL (ref 8–23)
CO2: 23 mmol/L (ref 22–32)
Calcium: 9.5 mg/dL (ref 8.9–10.3)
Chloride: 107 mmol/L (ref 98–111)
Creatinine, Ser: 1.46 mg/dL — ABNORMAL HIGH (ref 0.44–1.00)
GFR, Estimated: 41 mL/min — ABNORMAL LOW (ref >60)
Glucose, Bld: 74 mg/dL (ref 70–99)
Potassium: 4.1 mmol/L (ref 3.5–5.1)
Sodium: 139 mmol/L (ref 135–145)
Total Bilirubin: 0.8 mg/dL (ref 0.0–1.2)
Total Protein: 6.9 g/dL (ref 6.5–8.1)

## 2023-04-05 LAB — CBC WITH DIFFERENTIAL/PLATELET
Abs Immature Granulocytes: 0.02 10*3/uL (ref 0.00–0.07)
Basophils Absolute: 0 10*3/uL (ref 0.0–0.1)
Basophils Relative: 0 %
Eosinophils Absolute: 0.3 10*3/uL (ref 0.0–0.5)
Eosinophils Relative: 4 %
HCT: 33.9 % — ABNORMAL LOW (ref 36.0–46.0)
Hemoglobin: 11.1 g/dL — ABNORMAL LOW (ref 12.0–15.0)
Immature Granulocytes: 0 %
Lymphocytes Relative: 37 %
Lymphs Abs: 2.7 10*3/uL (ref 0.7–4.0)
MCH: 31 pg (ref 26.0–34.0)
MCHC: 32.7 g/dL (ref 30.0–36.0)
MCV: 94.7 fL (ref 80.0–100.0)
Monocytes Absolute: 0.8 10*3/uL (ref 0.1–1.0)
Monocytes Relative: 12 %
Neutro Abs: 3.3 10*3/uL (ref 1.7–7.7)
Neutrophils Relative %: 47 %
Platelets: 283 10*3/uL (ref 150–400)
RBC: 3.58 MIL/uL — ABNORMAL LOW (ref 3.87–5.11)
RDW: 13.2 % (ref 11.5–15.5)
WBC: 7.2 10*3/uL (ref 4.0–10.5)
nRBC: 0 % (ref 0.0–0.2)

## 2023-04-05 LAB — TYPE AND SCREEN
ABO/RH(D): O POS
Antibody Screen: NEGATIVE

## 2023-04-05 LAB — POC OCCULT BLOOD, ED: Fecal Occult Bld: POSITIVE — AB

## 2023-04-05 MED ORDER — SODIUM CHLORIDE 0.9 % IV BOLUS
1000.0000 mL | Freq: Once | INTRAVENOUS | Status: AC
  Administered 2023-04-05: 1000 mL via INTRAVENOUS

## 2023-04-05 NOTE — ED Provider Notes (Signed)
 [place] [facility] AT [facility] Provider Note   CSN: [id number] Arrival date & time: 04/05/23  2015     History  Chief Complaint  Patient presents with   Rectal Bleeding    Becky [name] is a 63 y.o. female.  Patient is a 63 year old female with past medical history of GI bleed, diverticulosis, type 2 diabetes atrial fibrillation on Xarelto.  Patient presenting today for evaluation of rectal bleeding.  For the past 2 weeks, she has noted blood in her stool every time she has a bowel movement.  This usually happens 1-2 times per day.  She denies any she is having any abdominal pain.  No fevers or chills.  She denies any weakness, lightheadedness, chest pain, or shortness of breath.  Last colonoscopy/endoscopy was in 2022 showing diverticulosis, but no other obvious findings.  Patient is followed by Dr. Jena Gauss in the GI office.       Home Medications Prior to Admission medications   Medication Sig Start Date End Date Taking? Authorizing Provider  albuterol (VENTOLIN HFA) 108 (90 Base) MCG/ACT inhaler Inhale 2 puffs into the lungs every 4 (four) hours as needed for wheezing or shortness of breath. 12/07/21   Particia Nearing, PA-C  ALPRAZolam Prudy Feeler) 0.5 MG tablet Take 0.5 mg by mouth 2 (two) times daily as needed. Patient not taking: Reported on 01/23/2023 03/06/22   [provider]  carboxymethylcellulose (REFRESH PLUS) 0.5 % SOLN Place 1 drop into both eyes daily as needed (irritation).    [provider]  Cholecalciferol (VITAMIN D-3 PO) Take 1 tablet by mouth 3 (three) times a week.    [provider]  cyclobenzaprine (FLEXERIL) 10 MG tablet Take 10 mg by mouth every 6 (six) hours as needed. Patient not taking: Reported on 01/23/2023 09/12/22   [provider]  diltiazem (CARDIZEM CD) 360 MG 24 hr capsule TAKE 1 CAPSULE BY MOUTH EVERY DAY 12/10/22   Antoine Poche, MD  EPINEPHrine 0.3 mg/0.3 mL IJ SOAJ injection  Inject 0.3 mg into the muscle once as needed for anaphylaxis. 04/20/18   [provider]  fluticasone (FLONASE) 50 MCG/ACT nasal spray Place 2 sprays into both nostrils daily. Patient taking differently: Place 2 sprays into both nostrils daily as needed for allergies. 02/02/20   Domenick Gong, MD  furosemide (LASIX) 40 MG tablet Take 1 tablet (40 mg total) by mouth daily as needed. Patient taking differently: Take 40 mg by mouth daily as needed for fluid. 09/13/21   Antoine Poche, MD  glimepiride (AMARYL) 2 MG tablet Take 2 mg by mouth daily with breakfast.    [provider]  KLOR-CON M20 20 MEQ tablet Take 20 mEq by mouth daily. 08/28/22   [provider]  Lifitegrast Benay Spice) 5 % SOLN Place 1 drop into both eyes 2 (two) times daily as needed (dry/irritated eyes.).    [provider]  lubiprostone (AMITIZA) 24 MCG capsule TAKE 1 CAPSULE (24 MCG TOTAL) BY MOUTH 2 (TWO) TIMES DAILY WITH A MEAL. Patient taking differently: Take 24 mcg by mouth every other day. 06/12/21   Gelene Mink, NP  magnesium oxide (MAG-OX) 400 (240 Mg) MG tablet Take 400 mg by mouth daily.    [provider]  metoprolol tartrate (LOPRESSOR) 50 MG tablet TAKE 1 TABLET BY MOUTH TWICE A DAY 12/25/22   Antoine Poche, MD  montelukast (SINGULAIR) 10 MG tablet Take 10 mg by mouth at bedtime. 08/25/16   [provider]  NON FORMULARY Pt uses a cpap nightly    [provider]  olmesartan (BENICAR) 40 MG tablet Take 1 tablet (40 mg total) by mouth daily. 04/06/20   Antoine Poche, MD  ondansetron (ZOFRAN) 4 MG tablet Take 1 tablet (4 mg total) by mouth every 6 (six) hours. 01/23/23   Burgess Amor, PA-C  oxybutynin (DITROPAN-XL) 10 MG 24 hr tablet TAKE 1 TABLET BY MOUTH EVERY DAY Patient not taking: Reported on 01/23/2023 12/10/22   Adline Potter, NP  oxyCODONE-acetaminophen (PERCOCET/ROXICET) 5-325 MG tablet Take 1 tablet by mouth every 4 (four) hours as  needed. Patient not taking: Reported on 03/25/2023 09/12/22   [provider]  pantoprazole (PROTONIX) 40 MG tablet TAKE 1 TABLET (40 MG TOTAL) BY MOUTH DAILY TAKE 30 MINUTES BEFORE BREAKFAST 01/22/23   Gelene Mink, NP  rivaroxaban (XARELTO) 20 MG TABS tablet Take 1 tablet (20 mg total) by mouth daily. 12/08/22   Antoine Poche, MD  rosuvastatin (CRESTOR) 10 MG tablet TAKE 1 TABLET BY MOUTH EVERY DAY 03/19/23   Antoine Poche, MD  spironolactone (ALDACTONE) 25 MG tablet TAKE 1 TABLET BY MOUTH EVERY DAY 01/22/23   Antoine Poche, MD  SYNTHROID 25 MCG tablet Take 25 mcg by mouth daily. 09/25/22   [provider]  tizanidine (ZANAFLEX) 2 MG capsule Take 1 capsule (2 mg total) by mouth 3 (three) times daily as needed for muscle spasms. Do not drink alcohol or drive while taking this medication.  May cause drowsiness. Patient not taking: Reported on 03/25/2023 11/07/22   Particia Nearing, PA-C  traMADol (ULTRAM) 50 MG tablet Take 1 tablet (50 mg total) by mouth every 6 (six) hours as needed. Patient not taking: Reported on 03/25/2023 01/23/23   Burgess Amor, PA-C  zinc gluconate 50 MG tablet Take 50 mg by mouth daily. Patient not taking: Reported on 12/23/2022    [provider]      Allergies    Apresoline [hydralazine], Latex, Other, and Prednisone    Review of Systems   Review of Systems  All other systems reviewed and are negative.   Physical Exam Updated Vital Signs BP 130/60   Pulse 61   Temp 98.5 F (36.9 C) (Oral)   Resp (!) 21   SpO2 99%  Physical Exam Vitals and nursing note reviewed.  Constitutional:      General: She is not in acute distress.    Appearance: She is well-developed. She is not diaphoretic.  HENT:     Head: Normocephalic and atraumatic.  Cardiovascular:     Rate and Rhythm: Normal rate and regular rhythm.     Heart sounds: No murmur heard.    No friction rub. No gallop.  Pulmonary:     Effort: Pulmonary effort is normal.  No respiratory distress.     Breath sounds: Normal breath sounds. No wheezing.  Abdominal:     General: Bowel sounds are normal. There is no distension.     Palpations: Abdomen is soft.     Tenderness: There is no abdominal tenderness.  Genitourinary:    Rectum: Normal.     Comments: There is reddish colored stool that is heme positive.  Rectal exam otherwise unremarkable. Musculoskeletal:        General: Normal range of motion.     Cervical back: Normal range of motion and neck supple.  Skin:    General: Skin is warm and dry.  Neurological:     General: No focal deficit  present.     Mental Status: She is alert and oriented to person, place, and time.     ED Results / Procedures / Treatments   Labs (all labs ordered are listed, but only abnormal results are displayed) Labs Reviewed  CBC WITH DIFFERENTIAL/PLATELET - Abnormal; Notable for the following components:      Result Value   RBC 3.58 (*)    Hemoglobin 11.1 (*)    HCT 33.9 (*)    All other components within normal limits  COMPREHENSIVE METABOLIC PANEL WITH GFR - Abnormal; Notable for the following components:   Creatinine, Ser 1.46 (*)    GFR, Estimated 41 (*)    All other components within normal limits  POC OCCULT BLOOD, ED - Abnormal; Notable for the following components:   Fecal Occult Bld POSITIVE (*)    All other components within normal limits  TYPE AND SCREEN    EKG None  Radiology No results found.  Procedures Procedures    Medications Ordered in ED Medications  sodium chloride 0.9 % bolus 1,000 mL (0 mLs Intravenous Stopped 04/05/23 2149)    ED Course/ Medical Decision Making/ A&P  Patient with history of GI bleeding currently on Xarelto for A-fib.  Patient presenting for rectal bleeding for the past 2 weeks.  Patient arrives with stable vital signs and is afebrile.  Physical examination is basically unremarkable with the exception of guaiac positive stool.  Laboratory studies obtained  including CBC, CMP, both of which are basically unremarkable.  She does have a hemoglobin of 11.1 which is down from 12, but fairly consistent with her baseline.  She is guaiac stool positive.  At this point, patient is hemodynamically stable with hemoglobin of 11.  She has a benign abdomen, no abdominal pain, and white count of 7000.  I feel as though patient can safely be discharged with GI follow-up.  I will message her GI doctor and inform him of her situation so that expedited follow-up can be made.  I see no indication for admission or transfusion at this time.  Patient does not appear to be actively bleeding here in the ER.  She has also been off of her Xarelto for the past 3 days at the recommendation of her cardiologist due to the GI bleeding.  Final Clinical Impression(s) / ED Diagnoses Final diagnoses:  None    Rx / DC Orders ED Discharge Orders     None         Geoffery Lyons, MD 04/05/23 2321

## 2023-04-05 NOTE — Discharge Instructions (Signed)
 Return to the emergency department if you develop worsening bleeding, shortness of breath, lightheadedness, high fevers, or for other new and concerning symptoms.  Dr. Luvenia Starch office should contact you in the near future to arrange a follow-up appointment.

## 2023-04-05 NOTE — ED Triage Notes (Signed)
 Pt c/o rectal bleed x2 weeks. Pt states that it is dark red and fair amount. Pt is endorsing abdominal pain.

## 2023-04-05 NOTE — ED Notes (Signed)
 Pt ambulated to restroom independently.

## 2023-04-07 ENCOUNTER — Encounter: Payer: Self-pay | Admitting: Gastroenterology

## 2023-04-07 ENCOUNTER — Other Ambulatory Visit: Payer: Self-pay

## 2023-04-07 ENCOUNTER — Emergency Department (HOSPITAL_COMMUNITY)

## 2023-04-07 ENCOUNTER — Inpatient Hospital Stay (HOSPITAL_COMMUNITY)
Admission: EM | Admit: 2023-04-07 | Discharge: 2023-04-10 | DRG: 064 | Disposition: A | Discharge status: Home / Self Care | Attending: Internal Medicine | Admitting: Internal Medicine

## 2023-04-07 ENCOUNTER — Encounter (HOSPITAL_COMMUNITY): Payer: Self-pay

## 2023-04-07 ENCOUNTER — Ambulatory Visit: Payer: Self-pay | Admitting: *Deleted

## 2023-04-07 ENCOUNTER — Ambulatory Visit (INDEPENDENT_AMBULATORY_CARE_PROVIDER_SITE_OTHER): Admitting: Gastroenterology

## 2023-04-07 VITALS — BP 136/82 | HR 65 | Temp 97.7°F | Ht 67.0 in | Wt 256.6 lb

## 2023-04-07 DIAGNOSIS — K625 Hemorrhage of anus and rectum: Secondary | ICD-10-CM | POA: Diagnosis present

## 2023-04-07 DIAGNOSIS — Z860101 Personal history of adenomatous and serrated colon polyps: Secondary | ICD-10-CM | POA: Diagnosis not present

## 2023-04-07 DIAGNOSIS — Z79890 Hormone replacement therapy (postmenopausal): Secondary | ICD-10-CM | POA: Diagnosis not present

## 2023-04-07 DIAGNOSIS — I6389 Other cerebral infarction: Secondary | ICD-10-CM | POA: Diagnosis not present

## 2023-04-07 DIAGNOSIS — Z7901 Long term (current) use of anticoagulants: Secondary | ICD-10-CM

## 2023-04-07 DIAGNOSIS — J45909 Unspecified asthma, uncomplicated: Secondary | ICD-10-CM | POA: Diagnosis present

## 2023-04-07 DIAGNOSIS — Z8673 Personal history of transient ischemic attack (TIA), and cerebral infarction without residual deficits: Secondary | ICD-10-CM | POA: Diagnosis present

## 2023-04-07 DIAGNOSIS — I2510 Atherosclerotic heart disease of native coronary artery without angina pectoris: Secondary | ICD-10-CM | POA: Diagnosis present

## 2023-04-07 DIAGNOSIS — G459 Transient cerebral ischemic attack, unspecified: Secondary | ICD-10-CM | POA: Diagnosis not present

## 2023-04-07 DIAGNOSIS — Z7984 Long term (current) use of oral hypoglycemic drugs: Secondary | ICD-10-CM

## 2023-04-07 DIAGNOSIS — E1140 Type 2 diabetes mellitus with diabetic neuropathy, unspecified: Secondary | ICD-10-CM | POA: Diagnosis present

## 2023-04-07 DIAGNOSIS — Z833 Family history of diabetes mellitus: Secondary | ICD-10-CM

## 2023-04-07 DIAGNOSIS — I10 Essential (primary) hypertension: Secondary | ICD-10-CM | POA: Diagnosis present

## 2023-04-07 DIAGNOSIS — Z79899 Other long term (current) drug therapy: Secondary | ICD-10-CM | POA: Diagnosis not present

## 2023-04-07 DIAGNOSIS — I480 Paroxysmal atrial fibrillation: Secondary | ICD-10-CM | POA: Diagnosis present

## 2023-04-07 DIAGNOSIS — E1169 Type 2 diabetes mellitus with other specified complication: Secondary | ICD-10-CM | POA: Diagnosis present

## 2023-04-07 DIAGNOSIS — Z6839 Body mass index (BMI) 39.0-39.9, adult: Secondary | ICD-10-CM

## 2023-04-07 DIAGNOSIS — I63443 Cerebral infarction due to embolism of bilateral cerebellar arteries: Secondary | ICD-10-CM | POA: Diagnosis not present

## 2023-04-07 DIAGNOSIS — R42 Dizziness and giddiness: Secondary | ICD-10-CM | POA: Diagnosis not present

## 2023-04-07 DIAGNOSIS — D649 Anemia, unspecified: Secondary | ICD-10-CM | POA: Diagnosis not present

## 2023-04-07 DIAGNOSIS — E118 Type 2 diabetes mellitus with unspecified complications: Secondary | ICD-10-CM | POA: Diagnosis present

## 2023-04-07 DIAGNOSIS — K5900 Constipation, unspecified: Secondary | ICD-10-CM | POA: Diagnosis not present

## 2023-04-07 DIAGNOSIS — R209 Unspecified disturbances of skin sensation: Secondary | ICD-10-CM | POA: Diagnosis present

## 2023-04-07 DIAGNOSIS — G936 Cerebral edema: Secondary | ICD-10-CM | POA: Diagnosis present

## 2023-04-07 DIAGNOSIS — I6523 Occlusion and stenosis of bilateral carotid arteries: Secondary | ICD-10-CM | POA: Diagnosis not present

## 2023-04-07 DIAGNOSIS — K648 Other hemorrhoids: Secondary | ICD-10-CM | POA: Diagnosis present

## 2023-04-07 DIAGNOSIS — Z96651 Presence of right artificial knee joint: Secondary | ICD-10-CM | POA: Diagnosis present

## 2023-04-07 DIAGNOSIS — E039 Hypothyroidism, unspecified: Secondary | ICD-10-CM | POA: Diagnosis present

## 2023-04-07 DIAGNOSIS — Z91040 Latex allergy status: Secondary | ICD-10-CM

## 2023-04-07 DIAGNOSIS — I4891 Unspecified atrial fibrillation: Secondary | ICD-10-CM | POA: Diagnosis not present

## 2023-04-07 DIAGNOSIS — K5731 Diverticulosis of large intestine without perforation or abscess with bleeding: Secondary | ICD-10-CM | POA: Diagnosis present

## 2023-04-07 DIAGNOSIS — I6621 Occlusion and stenosis of right posterior cerebral artery: Secondary | ICD-10-CM | POA: Diagnosis not present

## 2023-04-07 DIAGNOSIS — R29701 NIHSS score 1: Secondary | ICD-10-CM | POA: Diagnosis present

## 2023-04-07 DIAGNOSIS — Z888 Allergy status to other drugs, medicaments and biological substances status: Secondary | ICD-10-CM

## 2023-04-07 DIAGNOSIS — Z87891 Personal history of nicotine dependence: Secondary | ICD-10-CM

## 2023-04-07 DIAGNOSIS — I482 Chronic atrial fibrillation, unspecified: Secondary | ICD-10-CM | POA: Diagnosis present

## 2023-04-07 DIAGNOSIS — E66813 Obesity, class 3: Secondary | ICD-10-CM | POA: Diagnosis present

## 2023-04-07 DIAGNOSIS — K5730 Diverticulosis of large intestine without perforation or abscess without bleeding: Secondary | ICD-10-CM | POA: Diagnosis not present

## 2023-04-07 DIAGNOSIS — Z8249 Family history of ischemic heart disease and other diseases of the circulatory system: Secondary | ICD-10-CM

## 2023-04-07 DIAGNOSIS — R2990 Unspecified symptoms and signs involving the nervous system: Secondary | ICD-10-CM

## 2023-04-07 DIAGNOSIS — I639 Cerebral infarction, unspecified: Principal | ICD-10-CM | POA: Diagnosis present

## 2023-04-07 DIAGNOSIS — K219 Gastro-esophageal reflux disease without esophagitis: Secondary | ICD-10-CM | POA: Diagnosis not present

## 2023-04-07 DIAGNOSIS — E7800 Pure hypercholesterolemia, unspecified: Secondary | ICD-10-CM | POA: Diagnosis present

## 2023-04-07 DIAGNOSIS — R519 Headache, unspecified: Secondary | ICD-10-CM | POA: Diagnosis not present

## 2023-04-07 DIAGNOSIS — I6782 Cerebral ischemia: Secondary | ICD-10-CM | POA: Diagnosis not present

## 2023-04-07 DIAGNOSIS — K921 Melena: Secondary | ICD-10-CM | POA: Diagnosis not present

## 2023-04-07 DIAGNOSIS — K5909 Other constipation: Secondary | ICD-10-CM | POA: Diagnosis present

## 2023-04-07 DIAGNOSIS — K922 Gastrointestinal hemorrhage, unspecified: Secondary | ICD-10-CM | POA: Diagnosis present

## 2023-04-07 DIAGNOSIS — R9082 White matter disease, unspecified: Secondary | ICD-10-CM | POA: Diagnosis not present

## 2023-04-07 LAB — CBC
HCT: 33.5 % — ABNORMAL LOW (ref 36.0–46.0)
Hemoglobin: 10.9 g/dL — ABNORMAL LOW (ref 12.0–15.0)
MCH: 30.8 pg (ref 26.0–34.0)
MCHC: 32.5 g/dL (ref 30.0–36.0)
MCV: 94.6 fL (ref 80.0–100.0)
Platelets: 296 10*3/uL (ref 150–400)
RBC: 3.54 MIL/uL — ABNORMAL LOW (ref 3.87–5.11)
RDW: 13 % (ref 11.5–15.5)
WBC: 5.9 10*3/uL (ref 4.0–10.5)
nRBC: 0 % (ref 0.0–0.2)

## 2023-04-07 LAB — DIFFERENTIAL
Abs Immature Granulocytes: 0.02 10*3/uL (ref 0.00–0.07)
Basophils Absolute: 0 10*3/uL (ref 0.0–0.1)
Basophils Relative: 0 %
Eosinophils Absolute: 0.2 10*3/uL (ref 0.0–0.5)
Eosinophils Relative: 3 %
Immature Granulocytes: 0 %
Lymphocytes Relative: 33 %
Lymphs Abs: 2 10*3/uL (ref 0.7–4.0)
Monocytes Absolute: 0.6 10*3/uL (ref 0.1–1.0)
Monocytes Relative: 11 %
Neutro Abs: 3.1 10*3/uL (ref 1.7–7.7)
Neutrophils Relative %: 53 %

## 2023-04-07 LAB — HEMOGLOBIN AND HEMATOCRIT, BLOOD
HCT: 34.8 % — ABNORMAL LOW (ref 36.0–46.0)
Hemoglobin: 11.5 g/dL — ABNORMAL LOW (ref 12.0–15.0)

## 2023-04-07 LAB — COMPREHENSIVE METABOLIC PANEL WITH GFR
ALT: 15 U/L (ref 0–44)
AST: 17 U/L (ref 15–41)
Albumin: 4 g/dL (ref 3.5–5.0)
Alkaline Phosphatase: 58 U/L (ref 38–126)
Anion gap: 9 (ref 5–15)
BUN: 11 mg/dL (ref 8–23)
CO2: 25 mmol/L (ref 22–32)
Calcium: 9.8 mg/dL (ref 8.9–10.3)
Chloride: 106 mmol/L (ref 98–111)
Creatinine, Ser: 1.08 mg/dL — ABNORMAL HIGH (ref 0.44–1.00)
GFR, Estimated: 58 mL/min — ABNORMAL LOW (ref >60)
Glucose, Bld: 96 mg/dL (ref 70–99)
Potassium: 3.8 mmol/L (ref 3.5–5.1)
Sodium: 140 mmol/L (ref 135–145)
Total Bilirubin: 0.8 mg/dL (ref 0.0–1.2)
Total Protein: 6.9 g/dL (ref 6.5–8.1)

## 2023-04-07 LAB — URINALYSIS, ROUTINE W REFLEX MICROSCOPIC
Bilirubin Urine: NEGATIVE
Glucose, UA: NEGATIVE mg/dL
Ketones, ur: NEGATIVE mg/dL
Leukocytes,Ua: NEGATIVE
Nitrite: NEGATIVE
Protein, ur: NEGATIVE mg/dL
Specific Gravity, Urine: 1.005 (ref 1.005–1.030)
pH: 6 (ref 5.0–8.0)

## 2023-04-07 LAB — PROTIME-INR
INR: 1.8 — ABNORMAL HIGH (ref 0.8–1.2)
Prothrombin Time: 21.4 s — ABNORMAL HIGH (ref 11.4–15.2)

## 2023-04-07 LAB — RAPID URINE DRUG SCREEN, HOSP PERFORMED
Amphetamines: NOT DETECTED
Barbiturates: NOT DETECTED
Benzodiazepines: NOT DETECTED
Cocaine: NOT DETECTED
Opiates: NOT DETECTED
Tetrahydrocannabinol: NOT DETECTED

## 2023-04-07 LAB — ETHANOL: Alcohol, Ethyl (B): <10 mg/dL (ref <10)

## 2023-04-07 LAB — CBG MONITORING, ED: Glucose-Capillary: 104 mg/dL — ABNORMAL HIGH (ref 70–99)

## 2023-04-07 LAB — GLUCOSE, CAPILLARY: Glucose-Capillary: 95 mg/dL (ref 70–99)

## 2023-04-07 MED ORDER — METOCLOPRAMIDE HCL 5 MG/ML IJ SOLN
10.0000 mg | Freq: Once | INTRAMUSCULAR | Status: AC
  Administered 2023-04-07: 10 mg via INTRAVENOUS
  Filled 2023-04-07: qty 2

## 2023-04-07 MED ORDER — IOHEXOL 350 MG/ML SOLN
75.0000 mL | Freq: Once | INTRAVENOUS | Status: AC | PRN
Start: 2023-04-07 — End: 2023-04-07
  Administered 2023-04-07: 75 mL via INTRAVENOUS

## 2023-04-07 MED ORDER — TIZANIDINE HCL 4 MG PO TABS: 4.0000 mg | ORAL_TABLET | Freq: Four times a day (QID) | ORAL | Status: DC | PRN

## 2023-04-07 MED ORDER — STROKE: EARLY STAGES OF RECOVERY BOOK: Freq: Once | Status: AC

## 2023-04-07 MED ORDER — LACTATED RINGERS IV SOLN
INTRAVENOUS | Status: DC
Start: 2023-04-07 — End: 2023-04-07

## 2023-04-07 MED ORDER — SENNOSIDES-DOCUSATE SODIUM 8.6-50 MG PO TABS: 1.0000 | ORAL_TABLET | Freq: Every evening | ORAL | Status: DC | PRN

## 2023-04-07 MED ORDER — GABAPENTIN 100 MG PO CAPS: 100.0000 mg | ORAL_CAPSULE | Freq: Three times a day (TID) | ORAL | Status: DC

## 2023-04-07 MED ORDER — LEVOTHYROXINE SODIUM 25 MCG PO TABS
25.0000 ug | ORAL_TABLET | Freq: Every day | ORAL | Status: DC
  Administered 2023-04-08 – 2023-04-10 (×3): 25 ug via ORAL
  Filled 2023-04-07 (×3): qty 1

## 2023-04-07 MED ORDER — DIPHENHYDRAMINE HCL 50 MG/ML IJ SOLN
12.5000 mg | Freq: Once | INTRAMUSCULAR | Status: AC
  Administered 2023-04-07: 12.5 mg via INTRAVENOUS
  Filled 2023-04-07: qty 1

## 2023-04-07 MED ORDER — INSULIN ASPART 100 UNIT/ML IJ SOLN: 0.0000 [IU] | Freq: Four times a day (QID) | INTRAMUSCULAR | Status: DC

## 2023-04-07 MED ORDER — ACETAMINOPHEN 160 MG/5ML PO SOLN: 650.0000 mg | ORAL | Status: DC | PRN

## 2023-04-07 MED ORDER — ACETAMINOPHEN 650 MG RE SUPP: 650.0000 mg | RECTAL | Status: DC | PRN

## 2023-04-07 MED ORDER — DEXLANSOPRAZOLE 60 MG PO CPDR: 60.0000 mg | DELAYED_RELEASE_CAPSULE | Freq: Every day | ORAL | 3 refills | Status: DC

## 2023-04-07 MED ORDER — LACTATED RINGERS IV SOLN: INTRAVENOUS | Status: AC

## 2023-04-07 MED ORDER — ACETAMINOPHEN 325 MG PO TABS
650.0000 mg | ORAL_TABLET | ORAL | Status: DC | PRN
  Administered 2023-04-07 – 2023-04-08 (×3): 650 mg via ORAL
  Filled 2023-04-07 (×3): qty 2

## 2023-04-07 MED ORDER — LINACLOTIDE 72 MCG PO CAPS
72.0000 ug | ORAL_CAPSULE | Freq: Every day | ORAL | 3 refills | Status: DC
Start: 2023-04-07 — End: 2023-05-05

## 2023-04-07 MED ORDER — LACTATED RINGERS IV BOLUS
500.0000 mL | Freq: Once | INTRAVENOUS | Status: AC
  Administered 2023-04-07: 500 mL via INTRAVENOUS

## 2023-04-07 MED ORDER — PANTOPRAZOLE SODIUM 40 MG IV SOLR
40.0000 mg | Freq: Every day | INTRAVENOUS | Status: DC
  Administered 2023-04-07 – 2023-04-09 (×3): 40 mg via INTRAVENOUS
  Filled 2023-04-07 (×3): qty 10

## 2023-04-07 NOTE — Assessment & Plan Note (Signed)
 Currently in sinus rhythm. -Holding diltiazem and metoprolol in setting of GI bleed, also considering possible watershed infarct -Hold Eliquis

## 2023-04-07 NOTE — Assessment & Plan Note (Addendum)
 Reports multiple episodes of bleeding per rectum over the past few weeks.  With intermittent episodes of dizziness.  Hemoglobin 10.9, baseline over the past year 11-12.  She denies melena or hematochezia. -Evaluated by GI, plan for colonoscopy pending neurology clearance tomorrow. -Clear Liquid diet -Eliquis currently on hold. - Lr 75cc/hr x 15hrs -trend H&H

## 2023-04-07 NOTE — Patient Instructions (Signed)
 I recommend going to the ED to be evaluated.  I will keep an eye on your chart there. Once we know nothing acutely is going on, we can arrange a colonoscopy in the future.  I would favor continuing Xarelto at this time. When it's time for the colonoscopy, we will stop it for 2 days prior.   I enjoyed seeing you again today! I value our relationship and want to provide genuine, compassionate, and quality care. You may receive a survey regarding your visit with me, and I welcome your feedback! Thanks so much for taking the time to complete this. I look forward to seeing you again.      Gelene Mink, PhD, ANP-BC [facility]

## 2023-04-07 NOTE — Patient Outreach (Signed)
 Care Coordination   04/07/2023 Name: Becky [name] MRN: [id number] DOB: 18-Sep-1960   Care Coordination Outreach Attempts:  No attempt as patient was in the ED.  Follow Up Plan: Forwarding note to Care Guide for outreach and rescheduling. At that visit RN Care Manager will determine if current goals have been completed. If so, patient will be closed for Care Management services through [facility] due to non-participation in the program by PCP office.   Care Coordination Interventions:  No, not indicated. Note forwarded to Care Guide to request outreach and rescheduling   Demetrios Loll, RN, BSN [place]  [facility] RN Care Manager Direct Dial: [phone]  Fax: [phone]

## 2023-04-07 NOTE — Assessment & Plan Note (Addendum)
 Presenting with multiple symptoms of right arm numbness, left leg numbness, blurry vision, intermittent dizziness.  Dizziness may be secondary to rectal bleeding.  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 No associated hemorrhage or mass effect. The pattern is suspicious for recent embolic event. - EDP to neurology, possible watershed infarcts from hypotension considering GI bleed recommends keeping blood pressure stable avoid hypotension. -CTA neck pending - HgbA1c, lipid panel -Echocardiogram -PT OT speech therapy eval -Neurology consult
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
## 2023-04-07 NOTE — ED Provider Notes (Signed)
 [facility] AT [facility] Provider Note   CSN: [id number] Arrival date & time: 04/07/23  1012     History  Chief Complaint  Patient presents with   Dizziness    Maralee Higuchi is a 63 y.o. female.  HPI Patient presents for headache and dizziness.  Medical history includes HLD, asthma, GERD, HTN, migraines, atrial fibrillation, prediabetes, PUD, hemorrhoids, CAD, anemia.  She is on Xarelto.  She presented to the emergency department 2 days ago for hematochezia over the past 2 weeks.  She reported 1-2 episodes per day.  At the time, she was hemodynamically stable with hemoglobin of 11.  She was discharged with plan for GI follow-up.  She has been off of her Xarelto for the past 11 days.  She was seen by GI today.  GI is planning on a colonoscopy.  While at GI office, patient reported recent symptoms which have included intermittent dizziness over the past 3 days, intermittent right arm numbness over the past 3 days, as well as onset of right-sided headache today.  She was sent to the ED for further evaluation.  Patient reports that she does have ongoing hematochezia but feels that it has diminished.  Currently, at rest, she denies any symptoms other than her right-sided headache.  She states that the dizziness has not been associated with positional changes.    Home Medications Prior to Admission medications   Medication Sig Start Date End Date Taking? Authorizing Provider  diltiazem (TIAZAC) 360 MG 24 hr capsule Take 360 mg by mouth daily. 02/24/23  Yes [provider]  tiZANidine (ZANAFLEX) 4 MG tablet Take 4 mg by mouth every 6 (six) hours as needed. 12/25/22  Yes [provider]  albuterol (VENTOLIN HFA) 108 (90 Base) MCG/ACT inhaler Inhale 2 puffs into the lungs every 4 (four) hours as needed for wheezing or shortness of breath. 12/07/21   Particia Nearing, PA-C  carboxymethylcellulose (REFRESH PLUS) 0.5 % SOLN Place 1 drop into  both eyes daily as needed (irritation).    [provider]  Cholecalciferol (VITAMIN D-3 PO) Take 1 tablet by mouth 3 (three) times a week.    [provider]  cromolyn (OPTICROM) 4 % ophthalmic solution Place 1 drop into both eyes in the morning and at bedtime.    [provider]  dexlansoprazole (DEXILANT) 60 MG capsule Take 1 capsule (60 mg total) by mouth daily. 04/07/23   Gelene Mink, NP  diltiazem (CARDIZEM CD) 360 MG 24 hr capsule TAKE 1 CAPSULE BY MOUTH EVERY DAY 12/10/22   Antoine Poche, MD  EPINEPHrine 0.3 mg/0.3 mL IJ SOAJ injection Inject 0.3 mg into the muscle once as needed for anaphylaxis. 04/20/18   [provider]  gabapentin (NEURONTIN) 100 MG capsule Take 100 mg by mouth 3 (three) times daily.    [provider]  glimepiride (AMARYL) 2 MG tablet Take 2 mg by mouth daily with breakfast.    [provider]  KLOR-CON M20 20 MEQ tablet Take 20 mEq by mouth daily. 08/28/22   [provider]  Lifitegrast Benay Spice) 5 % SOLN Place 1 drop into both eyes 2 (two) times daily as needed (dry/irritated eyes.).    [provider]  linaclotide Karlene Einstein) 72 MCG capsule Take 1 capsule (72 mcg total) by mouth daily before breakfast. 04/07/23   Gelene Mink, NP  lubiprostone (AMITIZA) 24 MCG capsule TAKE 1 CAPSULE (24 MCG TOTAL) BY MOUTH 2 (TWO) TIMES DAILY WITH A  MEAL. Patient taking differently: Take 24 mcg by mouth every other day. 06/12/21   Gelene Mink, NP  magnesium oxide (MAG-OX) 400 (240 Mg) MG tablet Take 400 mg by mouth daily.    [provider]  metoprolol tartrate (LOPRESSOR) 50 MG tablet TAKE 1 TABLET BY MOUTH TWICE A DAY 12/25/22   Antoine Poche, MD  montelukast (SINGULAIR) 10 MG tablet Take 10 mg by mouth at bedtime. 08/25/16   [provider]  NON FORMULARY Pt uses a cpap nightly    [provider]  olmesartan (BENICAR) 40 MG tablet Take 1 tablet (40 mg total) by mouth daily. 04/06/20    Antoine Poche, MD  ondansetron (ZOFRAN) 4 MG tablet Take 1 tablet (4 mg total) by mouth every 6 (six) hours. 01/23/23   Burgess Amor, PA-C  pantoprazole (PROTONIX) 40 MG tablet TAKE 1 TABLET (40 MG TOTAL) BY MOUTH DAILY TAKE 30 MINUTES BEFORE BREAKFAST 01/22/23   Gelene Mink, NP  rivaroxaban (XARELTO) 20 MG TABS tablet Take 1 tablet (20 mg total) by mouth daily. 12/08/22   Antoine Poche, MD  rosuvastatin (CRESTOR) 10 MG tablet TAKE 1 TABLET BY MOUTH EVERY DAY 03/19/23   Antoine Poche, MD  spironolactone (ALDACTONE) 25 MG tablet TAKE 1 TABLET BY MOUTH EVERY DAY 01/22/23   Antoine Poche, MD  SYNTHROID 25 MCG tablet Take 25 mcg by mouth daily. 09/25/22   [provider]  traMADol (ULTRAM) 50 MG tablet Take 1 tablet (50 mg total) by mouth every 6 (six) hours as needed. 01/23/23   Burgess Amor, PA-C  zinc gluconate 50 MG tablet Take 50 mg by mouth daily.    [provider]      Allergies    Apresoline [hydralazine], Latex, Other, and Prednisone    Review of Systems   Review of Systems  Gastrointestinal:  Positive for blood in stool.  Neurological:  Positive for dizziness, numbness and headaches.  All other systems reviewed and are negative.   Physical Exam Updated Vital Signs BP (!) 147/82 (BP Location: Left Arm)   Pulse 80   Temp 98.7 F (37.1 C)   Resp 18   Ht 5\' 7"  (1.702 m)   Wt 115.6 kg   SpO2 99%   BMI 39.92 kg/m  Physical Exam Vitals and nursing note reviewed.  Constitutional:      General: She is not in acute distress.    Appearance: Normal appearance. She is well-developed. She is not ill-appearing, toxic-appearing or diaphoretic.  HENT:     Head: Normocephalic and atraumatic.     Right Ear: External ear normal.     Left Ear: External ear normal.     Nose: Nose normal.     Mouth/Throat:     Mouth: Mucous membranes are moist.  Eyes:     Extraocular Movements: Extraocular movements intact.     Conjunctiva/sclera: Conjunctivae normal.   Cardiovascular:     Rate and Rhythm: Normal rate and regular rhythm.  Pulmonary:     Effort: Pulmonary effort is normal. No respiratory distress.  Abdominal:     General: There is no distension.     Palpations: Abdomen is soft.     Tenderness: There is no abdominal tenderness.  Musculoskeletal:        General: No swelling. Normal range of motion.     Cervical back: Normal range of motion and neck supple.  Skin:    General: Skin is warm and dry.  Coloration: Skin is not jaundiced or pale.  Neurological:     General: No focal deficit present.     Mental Status: She is alert and oriented to person, place, and time.     Cranial Nerves: No cranial nerve deficit, dysarthria or facial asymmetry.     Sensory: Sensation is intact. No sensory deficit.     Motor: Motor function is intact. No abnormal muscle tone or pronator drift.     Coordination: Coordination is intact. Finger-Nose-Finger Test normal.  Psychiatric:        Mood and Affect: Mood normal.        Behavior: Behavior normal.     ED Results / Procedures / Treatments   Labs (all labs ordered are listed, but only abnormal results are displayed) Labs Reviewed  PROTIME-INR - Abnormal; Notable for the following components:      Result Value   Prothrombin Time 21.4 (*)    INR 1.8 (*)    All other components within normal limits  CBC - Abnormal; Notable for the following components:   RBC 3.54 (*)    Hemoglobin 10.9 (*)    HCT 33.5 (*)    All other components within normal limits  COMPREHENSIVE METABOLIC PANEL WITH GFR - Abnormal; Notable for the following components:   Creatinine, Ser 1.08 (*)    GFR, Estimated 58 (*)    All other components within normal limits  URINALYSIS, ROUTINE W REFLEX MICROSCOPIC - Abnormal; Notable for the following components:   Color, Urine STRAW (*)    Hgb urine dipstick MODERATE (*)    Bacteria, UA RARE (*)    All other components within normal limits  CBG MONITORING, ED - Abnormal;  Notable for the following components:   Glucose-Capillary 104 (*)    All other components within normal limits  DIFFERENTIAL  ETHANOL  RAPID URINE DRUG SCREEN, HOSP PERFORMED  I-STAT CHEM 8, ED    EKG EKG Interpretation Date/Time:  Tuesday April 07 2023 10:26:35 EDT Ventricular Rate:  62 PR Interval:  222 QRS Duration:  115 QT Interval:  469 QTC Calculation: 477 R Axis:   68  Text Interpretation: Sinus rhythm Prolonged PR interval Nonspecific intraventricular conduction delay Borderline low voltage, extremity leads Confirmed by Gloris Manchester ([id number]) on 04/07/2023 10:41:17 AM  Radiology MR ANGIO HEAD WO CONTRAST Result Date: 04/07/2023 CLINICAL DATA:  63 year old female TIA. EXAM: MRA HEAD WITHOUT CONTRAST TECHNIQUE: Angiographic images of the Circle of Willis were acquired using MRA technique without intravenous contrast. COMPARISON:  Brain MRI today reported separately FINDINGS: Anterior circulation: Antegrade flow in the distal cervical ICAs and both ICA siphons with evidence of calcified plaque and irregularity, but no hemodynamically significant siphon stenosis. Patent carotid termini. Normal ophthalmic and right posterior communicating artery origins. Patent MCA and ACA origins. Visible bilateral MCA and ACA branches are within normal limits. Posterior circulation: Antegrade flow in the distal vertebral arteries and basilar which appear irregular but without significant stenosis. AICA, SCA, left PCA origins are patent. Fetal type right PCA origin is patent. Bilateral PCA branches are within normal limits. Anatomic variants: Fetal type right PCA origin. Other: Brain MRI today is reported separately. IMPRESSION: 1. Head MRA is positive for evidence of intracranial atherosclerosis, but negative for large vessel occlusion or hemodynamically significant stenosis. 2. MRI today is reported separately. Electronically Signed   By: Odessa Fleming M.D.   On: 04/07/2023 13:02   MR BRAIN WO CONTRAST Result Date:  04/07/2023 CLINICAL DATA:  63 year old female TIA. EXAM:  MRI HEAD WITHOUT CONTRAST TECHNIQUE: Multiplanar, multiecho pulse sequences of the brain and surrounding structures were obtained without intravenous contrast. COMPARISON:  Intracranial MRA today reported separately. FINDINGS: Brain: Normal cerebral volume for age. Cavum septum pellucidum, normal variant. Scattered small foci of restricted diffusion in both cerebral hemispheres including the bilateral MCA and PCA territories. Deep gray nuclei, posterior fossa appear relatively spared although questionable subtle cerebellar involvement such as on series 7, image 9. Associated faint cytotoxic edema of the larger lesions (such as left motor or pre motor strip series 11, image 20. No acute hemorrhage identified, but there is a superimposed chronic microhemorrhage at the junction of the posterior left temporal and occipital lobe white matter on SWI series 20, image 22. No other chronic cerebral blood products identified, although widely scattered small nonspecific white matter T2 and FLAIR hyperintense foci. No chronic cortical encephalomalacia identified. No midline shift, mass effect, evidence of mass lesion, ventriculomegaly, extra-axial collection or acute intracranial hemorrhage. Cervicomedullary junction and pituitary are within normal limits. Vascular: Major intracranial vascular flow voids are preserved. MRA is reported separately. Skull and upper cervical spine: Negative for age visible cervical spine. Visualized bone marrow signal is within normal limits. Sinuses/Orbits: Negative. Other: Grossly normal visible internal auditory structures. Negative visible scalp and face. IMPRESSION: 1. Positive for multiple small and widely scattered scattered acute or subacute infarcts in both cerebral hemispheres. No associated hemorrhage or mass effect. The pattern is suspicious for recent embolic event. 2. Underlying chronic white matter signal changes which are  nonspecific but due to small vessel disease, including a solitary chronic microhemorrhage in the left hemisphere. 3. Intracranial MRA today is reported separately. Electronically Signed   By: Odessa Fleming M.D.   On: 04/07/2023 12:59    Procedures Procedures    Medications Ordered in ED Medications  lactated ringers infusion ( Intravenous New Bag/Given 04/07/23 1606)  metoCLOPramide (REGLAN) injection 10 mg (10 mg Intravenous Given 04/07/23 1057)  diphenhydrAMINE (BENADRYL) injection 12.5 mg (12.5 mg Intravenous Given 04/07/23 1057)  lactated ringers bolus 500 mL (0 mLs Intravenous Stopped 04/07/23 1200)  iohexol (OMNIPAQUE) 350 MG/ML injection 75 mL (75 mLs Intravenous Contrast Given 04/07/23 1633)    ED Course/ Medical Decision Making/ A&P                                 Medical Decision Making Amount and/or Complexity of Data Reviewed Labs: ordered. Radiology: ordered.  Risk Prescription drug management. Decision regarding hospitalization.   This patient presents to the ED for concern of intermittent dizziness and right arm numbness, this involves an extensive number of treatment options, and is a complaint that carries with it a high risk of complications and morbidity.  The differential diagnosis includes CVA, TIA, polypharmacy, anemia, dehydration, metabolic derangements, radiculopathy   Co morbidities that complicate the patient evaluation  HLD, asthma, GERD, HTN, migraines, atrial fibrillation, prediabetes, PUD, hemorrhoids, CAD, anemia   Additional history obtained:  Additional history obtained from N/A External records from outside source obtained and reviewed including EMR   Lab Tests:  I Ordered, and personally interpreted labs.  The pertinent results include: Stable hemoglobin, no leukocytosis, normal electrolytes, creatinine improved from baseline   Imaging Studies ordered:  I ordered imaging studies including MRI brain, MRA head, CTA head and neck. I independently  visualized and interpreted imaging which showed MRI showed multiple small, scattered acute/subacute infarcts in bilateral hemispheres. I agree with the radiologist interpretation  Cardiac Monitoring: / EKG:  The patient was maintained on a cardiac monitor.  I personally viewed and interpreted the cardiac monitored which showed an underlying rhythm of: Sinus rhythm   Consultations Obtained:  I requested consultation with the neurologist, Dr. Otelia Limes,  and discussed lab and imaging findings as well as pertinent plan - they recommend: Pattern of infarct suggest hypotensive etiology.  Patient will need to be admitted for stroke workup, however, attention should be paid to avoid any further hypotensive episodes.  Maintenance IV fluids and colonoscopy to manage GI bleeding if possible. I requested consultation with the gastroenterologist, Dr. Levon Hedger,  and discussed lab and imaging findings as well as pertinent plan - they recommend: Clear liquid diet for now.  Will see in consult.   Problem List / ED Course / Critical interventions / Medication management  Patient presenting for recent intermittent symptoms of dizziness and right arm numbness in addition to onset of right-sided headache today.  She does have a history of migraine headaches.  On arrival in the ED, she is well-appearing.  She has no focal neurologic deficits on exam.  She denies any current symptoms other than the right-sided headache.  Headache cocktail was ordered.  Workup was initiated.  Patient's MRI showed multiple scattered acute/subacute infarcts.  I spoke with neurologist on-call, Dr. Otelia Limes about this.  He did review imaging and feels like findings may be secondary to hypotensive episode.  He does not recommend restarting Eliquis or antiplatelet medication at this time.  He recommends maintaining normal blood pressures and management of GI bleeding.  She will require admission for stroke workup.  Of note, patient's blood  pressures have been in the range of 140s SBP during her stay in the ED.  I spoke with gastroenterologist, Dr. Levon Hedger, who will see the patient in consult.  Patient was admitted for further management. I ordered medication including IV fluids for hydration; Benadryl and Reglan for headache Reevaluation of the patient after these medicines showed that the patient improved I have reviewed the patients home medicines and have made adjustments as needed   Social Determinants of Health:  Has access to outpatient care         Final Clinical Impression(s) / ED Diagnoses Final diagnoses:  Cerebrovascular accident (CVA), unspecified mechanism (HCC)  Hematochezia    Rx / DC Orders ED Discharge Orders     None         Gloris Manchester, MD 04/07/23 1723
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
## 2023-04-07 NOTE — ED Notes (Signed)
 Patient transported to CT

## 2023-04-07 NOTE — Assessment & Plan Note (Signed)
Resume Synthroid ?

## 2023-04-07 NOTE — Assessment & Plan Note (Signed)
-   HgbA1c - SSI- S -Hold glipizide.

## 2023-04-07 NOTE — ED Triage Notes (Signed)
 Pt sent by her GI provider for dizziness, rt arm "feels like it's sleeping", for the past couple of weeks. Pt states headache this morning that is new.

## 2023-04-07 NOTE — Progress Notes (Signed)
 Patient seen today in GI clinic due to petrsistent recurrent rectal bleeding episodes for the last 3 weeks.  She stopped her anticoagulation 10 days ago due to persistent symptoms.  However, she has presented weakness and numbness in her right arm, along with double vision and dizziness for the last 3 days, for which there was concern for possible TIA.  I discussed the case with Dr. Otelia Limes (neurology) to obtain clearance for possible colonoscopy.  Patient will require a formal evaluation by neurology tomorrow to obtain clearance.  If cleared, we will give her a bowel prep for colonoscopy.  Will need to continue checking H&H daily and transfuse as needed.  Will keep her on a clear liquid diet for now.

## 2023-04-07 NOTE — ED Notes (Signed)
 Patient up to the floor via stretcher with NT.

## 2023-04-07 NOTE — Assessment & Plan Note (Signed)
 Blood pressure systolic 130s to 096E. -Hold diltiazem, metoprolol and olmesartan in the setting of GI bleeding and possible watershed CVA

## 2023-04-07 NOTE — ED Notes (Signed)
 Patient transported to MRI

## 2023-04-08 ENCOUNTER — Inpatient Hospital Stay

## 2023-04-08 DIAGNOSIS — R2990 Unspecified symptoms and signs involving the nervous system: Secondary | ICD-10-CM | POA: Insufficient documentation

## 2023-04-08 DIAGNOSIS — I639 Cerebral infarction, unspecified: Secondary | ICD-10-CM | POA: Diagnosis not present

## 2023-04-08 LAB — GLUCOSE, CAPILLARY
Glucose-Capillary: 100 mg/dL — ABNORMAL HIGH (ref 70–99)
Glucose-Capillary: 105 mg/dL — ABNORMAL HIGH (ref 70–99)
Glucose-Capillary: 117 mg/dL — ABNORMAL HIGH (ref 70–99)
Glucose-Capillary: 72 mg/dL (ref 70–99)
Glucose-Capillary: 95 mg/dL (ref 70–99)

## 2023-04-08 LAB — BASIC METABOLIC PANEL WITH GFR
Anion gap: 10 (ref 5–15)
BUN: 9 mg/dL (ref 8–23)
CO2: 23 mmol/L (ref 22–32)
Calcium: 9.3 mg/dL (ref 8.9–10.3)
Chloride: 106 mmol/L (ref 98–111)
Creatinine, Ser: 1.04 mg/dL — ABNORMAL HIGH (ref 0.44–1.00)
GFR, Estimated: >60 mL/min (ref >60)
Glucose, Bld: 98 mg/dL (ref 70–99)
Potassium: 3.7 mmol/L (ref 3.5–5.1)
Sodium: 139 mmol/L (ref 135–145)

## 2023-04-08 LAB — CBC
HCT: 31.5 % — ABNORMAL LOW (ref 36.0–46.0)
Hemoglobin: 10.5 g/dL — ABNORMAL LOW (ref 12.0–15.0)
MCH: 31.1 pg (ref 26.0–34.0)
MCHC: 33.3 g/dL (ref 30.0–36.0)
MCV: 93.2 fL (ref 80.0–100.0)
Platelets: 280 10*3/uL (ref 150–400)
RBC: 3.38 MIL/uL — ABNORMAL LOW (ref 3.87–5.11)
RDW: 13 % (ref 11.5–15.5)
WBC: 5.7 10*3/uL (ref 4.0–10.5)
nRBC: 0 % (ref 0.0–0.2)

## 2023-04-08 LAB — LIPID PANEL
Cholesterol: 160 mg/dL (ref 0–200)
HDL: 47 mg/dL (ref >40)
LDL Cholesterol: 93 mg/dL (ref 0–99)
Total CHOL/HDL Ratio: 3.4 ratio
Triglycerides: 98 mg/dL (ref <150)
VLDL: 20 mg/dL (ref 0–40)

## 2023-04-08 LAB — HEMOGLOBIN A1C
Hgb A1c MFr Bld: 5.9 % — ABNORMAL HIGH (ref 4.8–5.6)
Mean Plasma Glucose: 122.63 mg/dL

## 2023-04-08 LAB — HIV ANTIBODY (ROUTINE TESTING W REFLEX): HIV Screen 4th Generation wRfx: NONREACTIVE

## 2023-04-08 LAB — MAGNESIUM: Magnesium: 1.8 mg/dL (ref 1.7–2.4)

## 2023-04-08 MED ORDER — DILTIAZEM HCL ER COATED BEADS 180 MG PO CP24
360.0000 mg | ORAL_CAPSULE | Freq: Every day | ORAL | Status: DC
Start: 2023-04-08 — End: 2023-04-10
  Administered 2023-04-08 – 2023-04-09 (×2): 360 mg via ORAL
  Filled 2023-04-08 (×2): qty 2

## 2023-04-08 MED ORDER — BOOST / RESOURCE BREEZE PO LIQD CUSTOM
1.0000 | Freq: Three times a day (TID) | ORAL | Status: DC
  Administered 2023-04-09 (×2): 1 via ORAL

## 2023-04-08 MED ORDER — METOPROLOL TARTRATE 50 MG PO TABS
50.0000 mg | ORAL_TABLET | Freq: Two times a day (BID) | ORAL | Status: DC
  Administered 2023-04-08 – 2023-04-10 (×3): 50 mg via ORAL
  Filled 2023-04-08 (×5): qty 1

## 2023-04-08 NOTE — Progress Notes (Signed)
 Nurse at bedside,patient alert and oriented times four. Patient c/o pain a headache she rated a 6 dull and constant this am. Capillary blood glucose collected this am was 100.Plan of care ongoing.

## 2023-04-08 NOTE — Progress Notes (Signed)
 Subjective: Patient reports no rectal bleeding between last colonoscopy in 2022 and now. Having bleeding over the past 2-3 weeks in setting of more constipation. Taking Amitiza every other day. She notes she would sit for a while to defecate, feeling pressure in her rectum and then would eventually go and that's when she would see blood. She reports some rectal itching, using some preparation H cream/anusol suppositories PRN. She notes bleeding be seen as drips into the toilet everytime she had a BM but no bleeding otherwise.   No bleeding this morning but no BMs today either. She denies abdominal pain this morning. Has had some dizziness, though she feels this is when her A fib flares up.   Objective: Vital signs in last 24 hours: Temp:  [97.6 F (36.4 C)-98.9 F (37.2 C)] 98.1 F (36.7 C) (04/02 0807) Pulse Rate:  [54-145] 91 (04/02 0807) Resp:  [17-20] 18 (04/02 0551) BP: (116-156)/(30-85) 145/85 (04/02 0807) SpO2:  [93 %-100 %] 99 % (04/02 0807) FiO2 (%):  [21 %] 21 % (04/01 2159) Weight:  [115.6 kg-116.4 kg] 115.6 kg (04/01 1710) Last BM Date : 04/07/23 General:   Alert and oriented, pleasant Head:  Normocephalic and atraumatic. Eyes:  No icterus, sclera clear. Conjuctiva pink.  Mouth:  Without lesions, mucosa pink and moist.  Heart:  S1, S2 present, no murmurs noted.  Lungs: Clear to auscultation bilaterally, without wheezing, rales, or rhonchi.  Abdomen:  Bowel sounds present, soft, Mild TTP of RLQ (she reports this is chronic) non-distended. No HSM or hernias noted. No rebound or guarding. No masses appreciated  Neurologic:  Alert and  oriented x4;  grossly normal neurologically. Skin:  Warm and dry, intact without significant lesions.  Psych:  Alert and cooperative. Normal mood and affect.  Intake/Output from previous day: 04/01 0701 - 04/02 0700 In: 905 [P.O.:240; I.V.:165; IV Piggyback:500] Out: -  Intake/Output this shift: No intake/output data recorded.  Lab  Results: Recent Labs    04/05/23 2041 04/07/23 1108 04/07/23 1915 04/08/23 0455  WBC 7.2 5.9  --  5.7  HGB 11.1* 10.9* 11.5* 10.5*  HCT 33.9* 33.5* 34.8* 31.5*  PLT 283 296  --  280   BMET Recent Labs    04/05/23 2041 04/07/23 1108 04/08/23 0455  NA 139 140 139  K 4.1 3.8 3.7  CL 107 106 106  CO2 23 25 23   GLUCOSE 74 96 98  BUN 17 11 9   CREATININE 1.46* 1.08* 1.04*  CALCIUM 9.5 9.8 9.3   LFT Recent Labs    04/05/23 2041 04/07/23 1108  PROT 6.9 6.9  ALBUMIN 4.1 4.0  AST 17 17  ALT 16 15  ALKPHOS 64 58  BILITOT 0.8 0.8   PT/INR Recent Labs    04/07/23 1108  LABPROT 21.4*  INR 1.8*    Studies/Results: CT ANGIO HEAD NECK W WO CM Result Date: 04/07/2023 CLINICAL DATA:  Transient ischemic attack, dizziness, right arm feels "asleep" for the past few weeks. Headache this morning. Scattered acute and subacute infarcts in the bilateral cerebral hemispheres on same day MRI brain. EXAM: CT ANGIOGRAPHY HEAD AND NECK WITH AND WITHOUT CONTRAST TECHNIQUE: Multidetector CT imaging of the head and neck was performed using the standard protocol during bolus administration of intravenous contrast. Multiplanar CT image reconstructions and MIPs were obtained to evaluate the vascular anatomy. Carotid stenosis measurements (when applicable) are obtained utilizing NASCET criteria, using the distal internal carotid diameter as the denominator. RADIATION DOSE REDUCTION: This exam was performed according  to the departmental dose-optimization program which includes automated exposure control, adjustment of the mA and/or kV according to patient size and/or use of iterative reconstruction technique. CONTRAST:  75mL OMNIPAQUE IOHEXOL 350 MG/ML SOLN COMPARISON:  Same day MRI brain. FINDINGS: CT HEAD FINDINGS Brain: No acute intracranial hemorrhage. Infarcts on same day MRI are not well visualized on the current study. Nonspecific hypoattenuation in the periventricular and subcortical white matter  favored to reflect chronic microvascular ischemic changes. No edema, mass effect, or midline shift. The basilar cisterns are patent. Ventricles: Ventricles are normal in size and configuration. Vascular: No hyperdense vessel. Intracranial atherosclerotic calcifications noted. Skull: No acute or aggressive finding. Sinuses/orbits: The visualized paranasal sinuses are clear. Orbits are symmetric. Other: Mastoid air cells are clear. CTA NECK FINDINGS Aortic arch: Standard configuration of the aortic arch. Imaged portion shows no evidence of aneurysm or dissection. No significant stenosis of the major arch vessel origins. Pulmonary arteries: As permitted by contrast timing, there are no filling defects in the visualized pulmonary arteries. Subclavian arteries: The subclavian arteries are patent bilaterally. Right carotid system: Patent. Mild atherosclerosis at the carotid bifurcation without hemodynamically significant stenosis. No evidence of dissection. Left carotid system: No evidence of dissection, stenosis (50% or greater), or occlusion. Vertebral arteries: The bilateral vertebral arteries are patent at their origins. There is slightly limited evaluation of the distal V1 and proximal V2 segments of the right vertebral artery due to artifact and adjacent dense venous contrast. Visualized portions of the vertebral arteries are patent to the vertebrobasilar confluence. No evidence of high-grade stenosis or dissection. Skeleton: No acute or aggressive finding noted. Other neck: The visualized airway is patent. No cervical lymphadenopathy. Upper chest: Rounded masslike consolidative opacity involving the right upper lobe measuring 3.0 x 2.7 cm with adjacent patchy ground-glass and consolidative opacities. Review of the MIP images confirms the above findings CTA HEAD FINDINGS ANTERIOR CIRCULATION: The intracranial ICAs are patent bilaterally. Atherosclerosis of the bilateral carotid siphons resulting in mild stenosis. No  high-grade stenosis, proximal occlusion, aneurysm, or vascular malformation. MCAs: The middle cerebral arteries are patent bilaterally. ACAs: The anterior cerebral arteries are patent bilaterally. POSTERIOR CIRCULATION: No significant stenosis, proximal occlusion, aneurysm, or vascular malformation. PCAs: Patent bilaterally. Moderate narrowing of the posterior P2 segment of the right PCA. Pcomm: Visualized bilaterally more pronounced on the right. SCAs: The superior cerebellar arteries are patent bilaterally. Basilar artery: Patent AICAs: Patent PICAs: Not well visualized. Vertebral arteries: The intracranial vertebral arteries are patent. Venous sinuses: As permitted by contrast timing, patent. Anatomic variants: None Review of the MIP images confirms the above findings IMPRESSION: No large vessel occlusion. Moderate stenosis of the posterior P2 segment right PCA. Otherwise no significant stenosis of the arteries in the head and neck. No acute intracranial hemorrhage. Infarcts on same day MRI brain are not well visualized on the current study. 3.0 cm rounded masslike consolidative opacity in the right upper lobe with surrounding patchy ground-glass and consolidative opacities. Findings correspond to radiographic abnormality from 02/11/2023. Reiterate recommendation for dedicated CT chest. Electronically Signed   By: Emily Filbert M.D.   On: 04/07/2023 19:17   MR ANGIO HEAD WO CONTRAST Result Date: 04/07/2023 CLINICAL DATA:  63 year old female TIA. EXAM: MRA HEAD WITHOUT CONTRAST TECHNIQUE: Angiographic images of the Circle of Willis were acquired using MRA technique without intravenous contrast. COMPARISON:  Brain MRI today reported separately FINDINGS: Anterior circulation: Antegrade flow in the distal cervical ICAs and both ICA siphons with evidence of calcified plaque and irregularity, but no  hemodynamically significant siphon stenosis. Patent carotid termini. Normal ophthalmic and right posterior  communicating artery origins. Patent MCA and ACA origins. Visible bilateral MCA and ACA branches are within normal limits. Posterior circulation: Antegrade flow in the distal vertebral arteries and basilar which appear irregular but without significant stenosis. AICA, SCA, left PCA origins are patent. Fetal type right PCA origin is patent. Bilateral PCA branches are within normal limits. Anatomic variants: Fetal type right PCA origin. Other: Brain MRI today is reported separately. IMPRESSION: 1. Head MRA is positive for evidence of intracranial atherosclerosis, but negative for large vessel occlusion or hemodynamically significant stenosis. 2. MRI today is reported separately. Electronically Signed   By: Odessa Fleming M.D.   On: 04/07/2023 13:02   MR BRAIN WO CONTRAST Result Date: 04/07/2023 CLINICAL DATA:  63 year old female TIA. EXAM: MRI HEAD WITHOUT CONTRAST TECHNIQUE: Multiplanar, multiecho pulse sequences of the brain and surrounding structures were obtained without intravenous contrast. COMPARISON:  Intracranial MRA today reported separately. FINDINGS: Brain: Normal cerebral volume for age. Cavum septum pellucidum, normal variant. Scattered small foci of restricted diffusion in both cerebral hemispheres including the bilateral MCA and PCA territories. Deep gray nuclei, posterior fossa appear relatively spared although questionable subtle cerebellar involvement such as on series 7, image 9. Associated faint cytotoxic edema of the larger lesions (such as left motor or pre motor strip series 11, image 20. No acute hemorrhage identified, but there is a superimposed chronic microhemorrhage at the junction of the posterior left temporal and occipital lobe white matter on SWI series 20, image 22. No other chronic cerebral blood products identified, although widely scattered small nonspecific white matter T2 and FLAIR hyperintense foci. No chronic cortical encephalomalacia identified. No midline shift, mass effect,  evidence of mass lesion, ventriculomegaly, extra-axial collection or acute intracranial hemorrhage. Cervicomedullary junction and pituitary are within normal limits. Vascular: Major intracranial vascular flow voids are preserved. MRA is reported separately. Skull and upper cervical spine: Negative for age visible cervical spine. Visualized bone marrow signal is within normal limits. Sinuses/Orbits: Negative. Other: Grossly normal visible internal auditory structures. Negative visible scalp and face. IMPRESSION: 1. Positive for multiple small and widely scattered scattered acute or subacute infarcts in both cerebral hemispheres. No associated hemorrhage or mass effect. The pattern is suspicious for recent embolic event. 2. Underlying chronic white matter signal changes which are nonspecific but due to small vessel disease, including a solitary chronic microhemorrhage in the left hemisphere. 3. Intracranial MRA today is reported separately. Electronically Signed   By: Odessa Fleming M.D.   On: 04/07/2023 12:59    Assessment: Becky [name] is a 63 year old female with history of GERD, dysphagia, chronic constipation, atrial fibrillation on Xarelto, hypertension, hyperlipidemia, hypothyroidism, rectal bleeding due to hemorrhoids with banding in 2023 (right anterior x 3, left lateral, neutral banding) who presented to the ED yesterday after being seen by our team in outpatient GI clinic with persistent rectal bleeding for the past few weeks as well as some neurological symptoms to include double vision right arm numbness and tingling in right hand.  She reported last Xarelto dose was 3/21 due to rectal bleeding.  Hemoglobin in the ED was 10.9.  GI was consulted for further evaluation.  Rectal bleeding/anemia: Persistent rectal bleeding with defecation over the past few weeks though notably this has been in the setting of straining secondary to constipation and known history of hemorrhoids.  She is taking Amitiza 24  mcg once every other day as twice daily was too much. Feels rectal  pressure when defecating, some rectal itching she is using hemorrhoid cream/suppositories for.  She reported last dose of Xarelto was 3/21 as she held her blood thinner due to rectal bleeding.  She does have history of hemorrhoids which have been banded in the past.  Last colonoscopy in 2022 with diverticulosis in the sigmoid and descending colon, 3 polyps in the sigmoid colon normal-appearing terminal ileum, bleeding at that time was thought secondary to hemorrhoids or possible diverticular bleed that was self limiting.   Hemoglobin remains mostly stable in regards to her baseline (9-11 range over the last year).  10.5 this morning, was 10.9 yesterday and 11.5 on repeat.  Unlikely this is a significant GI Bleed given HD stability. Would favor hemorrhoidal bleeding in setting of constipation/known hemorrhoids and chronic ACs, however, given last TCS was 3 years ago, would be beneficial to evaluate her bleeding further with endoscopic evaluation.    She was started on liquid diet yesterday and case was discussed yesterday By Dr. Levon Hedger with neurology (Dr. Otelia Limes) to obtain clearance for possible colonoscopy while inpatient.  neurology plans to have formal evaluation of the patient today and will give input on clearance for colonoscopy, timing will depend on neurology's evaluation/recommendations.    Plan: Trend h&h Transfuse for hgb <7 Appreciate neuro evaluation/clearance Colonoscopy timing to be determined  Continue to hold xarelto Monitor for overt GI bleeding Continue linzess daily  Clear liquid diet Protonix 40mg  daily (resume dexilant as outpatient)    LOS: 1 day    04/08/2023, 8:57 AM   Lynda Wanninger L. Jeanmarie Hubert, MSN, APRN, AGNP-C Adult-Gerontology Nurse Practitioner [facility]

## 2023-04-08 NOTE — Evaluation (Signed)
 Physical Therapy Brief Evaluation and Discharge Note Patient Details Name: Becky [name] MRN: [id number] DOB: [birth year] Today's Date: 04/08/2023   History of Present Illness  Becky [name] is a 63 y.o. female with medical history significant for diabetes, hypertension, atrial fibrillation, coronary artery disease, hypothyroidism.  Patient presented to the ED with complaints of numbness to her right hand, and left toes, she also reports intermittent episodes of dizziness, blurred vision, headache.  Numbness started about 2 days ago.  Reports intermittent dizziness over the past month.  Dizziness and blurry vision are not dependent on position.  No facial asymmetry, no change in vision, she reports some weakness of her extremities but is not specific.  She reports over the past few weeks, she has been having bloody stools.  She reports sometimes she does not see the stool just a lot of blood.  She denies black stools, no vomiting or vomiting of blood.  Her Xarelto was held, she last took it- 3/21. (per MD)  Clinical Impression  Pt demonstrating great return for all mobility. Ambulated 63ft with no LOB or dizziness. Pt showing minimal <> moderate BLE weakness but this baseline. Pt would be good candidate for OP PT referral. Patient is performing at functional baseline, no safety concerns noted throughout evaluation.  Patient is safe to DC home with appropriate services, DME, and any other needs determined by care team.  PT to sign off at this time. PT discharging pt to mobility technician and nursing staff for further mobility activities. Please reconsult acute PT services if functional changes occur.  Thank you.     PT Assessment    Assistance Needed at Discharge       Equipment Recommendations None recommended by PT  Recommendations for Other Services       Precautions/Restrictions Precautions Precautions: Fall Recall of Precautions/Restrictions: Intact Restrictions Weight Bearing  Restrictions Per Provider Order: No        Mobility  Bed Mobility          Transfers Overall transfer level: Independent                 General transfer comment: mild labored effort. Pt reports baseline for transfers, no use of AD    Ambulation/Gait Ambulation/Gait assistance: Modified independent (Device/Increase time) Gait Distance (Feet): 55 Feet Assistive device: None Gait Pattern/deviations: Step-through pattern, Decreased step length - right, Decreased step length - left, Decreased stance time - right, Decreased stance time - left, Wide base of support   General Gait Details: 65ft ambulation without AD, demonstrating increased trunk lean during stance phase indicating gluteal weakness. Safe, no LOB.  Home Activity Instructions    Stairs            Modified Rankin (Stroke Patients Only)        Balance                          Pertinent Vitals/Pain   Pain Assessment Pain Assessment: Faces Faces Pain Scale: Hurts a little bit Pain Location: head Pain Descriptors / Indicators: Headache Pain Intervention(s): Monitored during session     Home Living   Living Arrangements: Spouse/significant other       Home Equipment: Rolling Walker (2 wheels);Cane - single point;Rollator (4 wheels);BSC/3in1;Shower seat        Prior Function        UE/LE Assessment               Communication   Communication Communication:  No apparent difficulties     Cognition         General Comments      Exercises     Assessment/Plan    PT Problem List         PT Visit Diagnosis Muscle weakness (generalized) (M62.81)    No Skilled PT Patient is modified independent with all activity/mobility   Co-evaluation PT/OT/SLP Co-Evaluation/Treatment: Yes Reason for Co-Treatment: To address functional/ADL transfers PT goals addressed during session: Mobility/safety with mobility;Balance OT goals addressed during session: ADL's and  self-care        AMPAC 6 Clicks Help needed turning from your back to your side while in a flat bed without using bedrails?: None Help needed moving from lying on your back to sitting on the side of a flat bed without using bedrails?: None Help needed moving to and from a bed to a chair (including a wheelchair)?: None Help needed standing up from a chair using your arms (e.g., wheelchair or bedside chair)?: None Help needed to walk in hospital room?: None Help needed climbing 3-5 steps with a railing? : A Little 6 Click Score: 23      End of Session Equipment Utilized During Treatment: Gait belt   Patient left: in chair;with call bell/phone within reach Nurse Communication: Mobility status PT Visit Diagnosis: Muscle weakness (generalized) (M62.81)     Time: 2353-6144 PT Time Calculation (min) (ACUTE ONLY): 14 min  Charges:   PT Evaluation $PT Eval Low Complexity: 1 Low      Elie Goody, DPT [facility] [place] [phone] office  Nelida Meuse  04/08/2023, 11:10 AM

## 2023-04-08 NOTE — Plan of Care (Signed)
  Problem: Education: Goal: Knowledge of General Education information will improve Description: Including pain rating scale, medication(s)/side effects and non-pharmacologic comfort measures Outcome: Progressing   Problem: Activity: Goal: Risk for activity intolerance will decrease Outcome: Progressing   Problem: Coping: Goal: Level of anxiety will decrease Outcome: Progressing   Problem: Education: Goal: Knowledge of General Education information will improve Description: Including pain rating scale, medication(s)/side effects and non-pharmacologic comfort measures Outcome: Progressing   Problem: Activity: Goal: Risk for activity intolerance will decrease Outcome: Progressing   Problem: Coping: Goal: Level of anxiety will decrease Outcome: Progressing

## 2023-04-08 NOTE — Plan of Care (Signed)
  Problem: Education: Goal: Knowledge of General Education information will improve Description: Including pain rating scale, medication(s)/side effects and non-pharmacologic comfort measures Outcome: Progressing   Problem: Health Behavior/Discharge Planning: Goal: Ability to manage health-related needs will improve Outcome: Progressing   Problem: Clinical Measurements: Goal: Ability to maintain clinical measurements within normal limits will improve Outcome: Progressing Goal: Will remain free from infection Outcome: Progressing Goal: Diagnostic test results will improve Outcome: Progressing Goal: Respiratory complications will improve Outcome: Progressing Goal: Cardiovascular complication will be avoided Outcome: Progressing   Problem: Activity: Goal: Risk for activity intolerance will decrease Outcome: Progressing   Problem: Nutrition: Goal: Adequate nutrition will be maintained Outcome: Progressing   Problem: Coping: Goal: Level of anxiety will decrease Outcome: Progressing   Problem: Elimination: Goal: Will not experience complications related to bowel motility Outcome: Progressing Goal: Will not experience complications related to urinary retention Outcome: Progressing   Problem: Pain Managment: Goal: General experience of comfort will improve and/or be controlled Outcome: Progressing   Problem: Safety: Goal: Ability to remain free from injury will improve Outcome: Progressing   Problem: Skin Integrity: Goal: Risk for impaired skin integrity will decrease Outcome: Progressing   Problem: Education: Goal: Knowledge of disease or condition will improve Outcome: Progressing Goal: Knowledge of secondary prevention will improve (MUST DOCUMENT ALL) Outcome: Progressing Goal: Knowledge of patient specific risk factors will improve (DELETE if not current risk factor) Outcome: Progressing   Problem: Ischemic Stroke/TIA Tissue Perfusion: Goal: Complications of  ischemic stroke/TIA will be minimized Outcome: Progressing   Problem: Coping: Goal: Will verbalize positive feelings about self Outcome: Progressing Goal: Will identify appropriate support needs Outcome: Progressing   Problem: Health Behavior/Discharge Planning: Goal: Ability to manage health-related needs will improve Outcome: Progressing Goal: Goals will be collaboratively established with patient/family Outcome: Progressing   Problem: Self-Care: Goal: Ability to participate in self-care as condition permits will improve Outcome: Progressing Goal: Verbalization of feelings and concerns over difficulty with self-care will improve Outcome: Progressing Goal: Ability to communicate needs accurately will improve Outcome: Progressing   Problem: Nutrition: Goal: Risk of aspiration will decrease Outcome: Progressing Goal: Dietary intake will improve Outcome: Progressing   Problem: Education: Goal: Ability to describe self-care measures that may prevent or decrease complications (Diabetes Survival Skills Education) will improve Outcome: Progressing Goal: Individualized Educational Video(s) Outcome: Progressing   Problem: Coping: Goal: Ability to adjust to condition or change in health will improve Outcome: Progressing   Problem: Fluid Volume: Goal: Ability to maintain a balanced intake and output will improve Outcome: Progressing   Problem: Health Behavior/Discharge Planning: Goal: Ability to identify and utilize available resources and services will improve Outcome: Progressing Goal: Ability to manage health-related needs will improve Outcome: Progressing   Problem: Metabolic: Goal: Ability to maintain appropriate glucose levels will improve Outcome: Progressing   Problem: Nutritional: Goal: Maintenance of adequate nutrition will improve Outcome: Progressing Goal: Progress toward achieving an optimal weight will improve Outcome: Progressing   Problem: Skin  Integrity: Goal: Risk for impaired skin integrity will decrease Outcome: Progressing   Problem: Tissue Perfusion: Goal: Adequacy of tissue perfusion will improve Outcome: Progressing

## 2023-04-08 NOTE — Progress Notes (Signed)
 PROGRESS NOTE    Becky [name]  WUJ:[id number] DOB: 03-25-1960 DOA: 04/07/2023 PCP: Elfredia Nevins, MD   Brief Narrative:    Becky [name] is a 63 y.o. female with medical history significant for diabetes, hypertension, atrial fibrillation, coronary artery disease, hypothyroidism. Patient presented to the ED with complaints of numbness to her right hand, and left toes, she also reports intermittent episodes of dizziness, blurred vision, headache.  Numbness started about 2 days ago.  Reports intermittent dizziness over the past month.  Dizziness and blurry vision are not dependent on position.  No facial asymmetry, no change in vision, she reports some weakness of her extremities but is not specific. She reports over the past few weeks, she has been having bloody stools.  She reports sometimes she does not see the stool just a lot of blood.  She denies black stools, no vomiting or vomiting of blood. Her Xarelto was held, she last took it- 3/21.  Assessment & Plan:   Principal Problem:   Stroke [facility]) Active Problems:   Atrial fibrillation (HCC)   Hypothyroidism   Type II diabetes mellitus with complication (HCC)   Essential hypertension   Rectal bleeding  Assessment and Plan:  Stroke [facility]) Presenting with multiple symptoms of right arm numbness, left leg numbness, blurry vision, intermittent dizziness.  Dizziness may be secondary to rectal bleeding.  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 No associated hemorrhage or mass effect. The pattern is suspicious for recent embolic event. -Appreciate neurology evaluation -CTA neck pending - HgbA1c 5.9%, lipid panel with LDL at 93 -Echocardiogram pending -PT OT speech therapy eval   Atrial fibrillation (HCC) Currently in sinus rhythm. -Holding diltiazem and metoprolol in setting of GI bleed, also considering possible watershed infarct -Hold  Eliquis   Rectal bleeding Reports multiple episodes of bleeding per rectum over the past few weeks.  With intermittent episodes of dizziness.  Hemoglobin 10.9, baseline over the past year 11-12.  She denies melena or hematochezia. -Evaluated by GI, plan for colonoscopy pending neurology clearance  -IV PPI daily -Clear Liquid diet -Xarelto currently on hold - Lr 75cc/hr x 15hrs -trend H&H   Essential hypertension Blood pressure systolic 130s to 956O. -Hold diltiazem, metoprolol and olmesartan in the setting of GI bleeding and possible watershed CVA   Type II diabetes mellitus with complication (HCC) - HgbA1c 5.9% - SSI- S -Hold glipizide.   Hypothyroidism Resume Synthroid  Obesity, class II -BMI 39.92    DVT prophylaxis: SCDs Code Status: Full Family Communication: None at bedside Disposition Plan:  Status is: Inpatient Remains inpatient appropriate because: Need for IV medications and inpatient evaluation.   Consultants:  Neurology GI  Procedures:  None  Antimicrobials:  None   Subjective: Patient seen and evaluated today with no new acute complaints or concerns. No acute concerns or events noted overnight.  Noted to have bloody bowel movement overnight.  She states that her right arm symptoms have improved.  Objective: Vitals:   04/08/23 0551 04/08/23 0807 04/08/23 1104 04/08/23 1152  BP: (!) 154/70 (!) 145/85 (!) 143/73 137/80  Pulse: (!) 145 91 (!) 42 88  Resp: 18  16 17   Temp: 98.9 F (37.2 C) 98.1 F (36.7 C) 97.8 F (36.6 C) 97.9 F (36.6 C)  TempSrc: Oral Oral Oral Oral  SpO2: 96% 99% 96% 100%  Weight:      Height:        Intake/Output Summary (Last 24  hours) at 04/08/2023 1158 Last data filed at 04/08/2023 0500 Gross per 24 hour  Intake 904.97 ml  Output --  Net 904.97 ml   Filed Weights   04/07/23 1023 04/07/23 1710  Weight: 116.1 kg 115.6 kg    Examination:  General exam: Appears calm and comfortable  Respiratory system: Clear to  auscultation. Respiratory effort normal. Cardiovascular system: S1 & S2 heard, RRR.  Gastrointestinal system: Abdomen is soft Central nervous system: Alert and awake Extremities: No edema Skin: No significant lesions noted Psychiatry: Flat affect.    Data Reviewed: I have personally reviewed following labs and imaging studies  CBC: Recent Labs  Lab 04/05/23 2041 04/07/23 1108 04/07/23 1915 04/08/23 0455  WBC 7.2 5.9  --  5.7  NEUTROABS 3.3 3.1  --   --   HGB 11.1* 10.9* 11.5* 10.5*  HCT 33.9* 33.5* 34.8* 31.5*  MCV 94.7 94.6  --  93.2  PLT 283 296  --  280   Basic Metabolic Panel: Recent Labs  Lab 04/05/23 2041 04/07/23 1108 04/08/23 0455  NA 139 140 139  K 4.1 3.8 3.7  CL 107 106 106  CO2 23 25 23   GLUCOSE 74 96 98  BUN 17 11 9   CREATININE 1.46* 1.08* 1.04*  CALCIUM 9.5 9.8 9.3  MG  --   --  1.8   GFR: Estimated Creatinine Clearance: 74.6 mL/min (A) (by C-G formula based on SCr of 1.04 mg/dL (H)). Liver Function Tests: Recent Labs  Lab 04/05/23 2041 04/07/23 1108  AST 17 17  ALT 16 15  ALKPHOS 64 58  BILITOT 0.8 0.8  PROT 6.9 6.9  ALBUMIN 4.1 4.0   No results for input(s): "LIPASE", "AMYLASE" in the last 168 hours. No results for input(s): "AMMONIA" in the last 168 hours. Coagulation Profile: Recent Labs  Lab 04/07/23 1108  INR 1.8*   Cardiac Enzymes: No results for input(s): "CKTOTAL", "CKMB", "CKMBINDEX", "TROPONINI" in the last 168 hours. BNP (last 3 results) No results for input(s): "PROBNP" in the last 8760 hours. HbA1C: Recent Labs    04/07/23 1108  HGBA1C 5.9*   CBG: Recent Labs  Lab 04/07/23 2052 04/08/23 0023 04/08/23 0558 04/08/23 0810 04/08/23 1132  GLUCAP 95 95 105* 100* 117*   Lipid Profile: Recent Labs    04/08/23 0455  CHOL 160  HDL 47  LDLCALC 93  TRIG 98  CHOLHDL 3.4   Thyroid Function Tests: No results for input(s): "TSH", "T4TOTAL", "FREET4", "T3FREE", "THYROIDAB" in the last 72 hours. Anemia  Panel: No results for input(s): "VITAMINB12", "FOLATE", "FERRITIN", "TIBC", "IRON", "RETICCTPCT" in the last 72 hours. Sepsis Labs: No results for input(s): "PROCALCITON", "LATICACIDVEN" in the last 168 hours.  No results found for this or any previous visit (from the past 240 hours).       Radiology Studies: CT ANGIO HEAD NECK W WO CM Result Date: 04/07/2023 CLINICAL DATA:  Transient ischemic attack, dizziness, right arm feels "asleep" for the past few weeks. Headache this morning. Scattered acute and subacute infarcts in the bilateral cerebral hemispheres on same day MRI brain. EXAM: CT ANGIOGRAPHY HEAD AND NECK WITH AND WITHOUT CONTRAST TECHNIQUE: Multidetector CT imaging of the head and neck was performed using the standard protocol during bolus administration of intravenous contrast. Multiplanar CT image reconstructions and MIPs were obtained to evaluate the vascular anatomy. Carotid stenosis measurements (when applicable) are obtained utilizing NASCET criteria, using the distal internal carotid diameter as the denominator. RADIATION DOSE REDUCTION: This exam was performed according to the  departmental dose-optimization program which includes automated exposure control, adjustment of the mA and/or kV according to patient size and/or use of iterative reconstruction technique. CONTRAST:  75mL OMNIPAQUE IOHEXOL 350 MG/ML SOLN COMPARISON:  Same day MRI brain. FINDINGS: CT HEAD FINDINGS Brain: No acute intracranial hemorrhage. Infarcts on same day MRI are not well visualized on the current study. Nonspecific hypoattenuation in the periventricular and subcortical white matter favored to reflect chronic microvascular ischemic changes. No edema, mass effect, or midline shift. The basilar cisterns are patent. Ventricles: Ventricles are normal in size and configuration. Vascular: No hyperdense vessel. Intracranial atherosclerotic calcifications noted. Skull: No acute or aggressive finding. Sinuses/orbits: The  visualized paranasal sinuses are clear. Orbits are symmetric. Other: Mastoid air cells are clear. CTA NECK FINDINGS Aortic arch: Standard configuration of the aortic arch. Imaged portion shows no evidence of aneurysm or dissection. No significant stenosis of the major arch vessel origins. Pulmonary arteries: As permitted by contrast timing, there are no filling defects in the visualized pulmonary arteries. Subclavian arteries: The subclavian arteries are patent bilaterally. Right carotid system: Patent. Mild atherosclerosis at the carotid bifurcation without hemodynamically significant stenosis. No evidence of dissection. Left carotid system: No evidence of dissection, stenosis (50% or greater), or occlusion. Vertebral arteries: The bilateral vertebral arteries are patent at their origins. There is slightly limited evaluation of the distal V1 and proximal V2 segments of the right vertebral artery due to artifact and adjacent dense venous contrast. Visualized portions of the vertebral arteries are patent to the vertebrobasilar confluence. No evidence of high-grade stenosis or dissection. Skeleton: No acute or aggressive finding noted. Other neck: The visualized airway is patent. No cervical lymphadenopathy. Upper chest: Rounded masslike consolidative opacity involving the right upper lobe measuring 3.0 x 2.7 cm with adjacent patchy ground-glass and consolidative opacities. Review of the MIP images confirms the above findings CTA HEAD FINDINGS ANTERIOR CIRCULATION: The intracranial ICAs are patent bilaterally. Atherosclerosis of the bilateral carotid siphons resulting in mild stenosis. No high-grade stenosis, proximal occlusion, aneurysm, or vascular malformation. MCAs: The middle cerebral arteries are patent bilaterally. ACAs: The anterior cerebral arteries are patent bilaterally. POSTERIOR CIRCULATION: No significant stenosis, proximal occlusion, aneurysm, or vascular malformation. PCAs: Patent bilaterally. Moderate  narrowing of the posterior P2 segment of the right PCA. Pcomm: Visualized bilaterally more pronounced on the right. SCAs: The superior cerebellar arteries are patent bilaterally. Basilar artery: Patent AICAs: Patent PICAs: Not well visualized. Vertebral arteries: The intracranial vertebral arteries are patent. Venous sinuses: As permitted by contrast timing, patent. Anatomic variants: None Review of the MIP images confirms the above findings IMPRESSION: No large vessel occlusion. Moderate stenosis of the posterior P2 segment right PCA. Otherwise no significant stenosis of the arteries in the head and neck. No acute intracranial hemorrhage. Infarcts on same day MRI brain are not well visualized on the current study. 3.0 cm rounded masslike consolidative opacity in the right upper lobe with surrounding patchy ground-glass and consolidative opacities. Findings correspond to radiographic abnormality from 02/11/2023. Reiterate recommendation for dedicated CT chest. Electronically Signed   By: Emily Filbert M.D.   On: 04/07/2023 19:17   MR ANGIO HEAD WO CONTRAST Result Date: 04/07/2023 CLINICAL DATA:  63 year old female TIA. EXAM: MRA HEAD WITHOUT CONTRAST TECHNIQUE: Angiographic images of the Circle of Willis were acquired using MRA technique without intravenous contrast. COMPARISON:  Brain MRI today reported separately FINDINGS: Anterior circulation: Antegrade flow in the distal cervical ICAs and both ICA siphons with evidence of calcified plaque and irregularity, but no hemodynamically significant  siphon stenosis. Patent carotid termini. Normal ophthalmic and right posterior communicating artery origins. Patent MCA and ACA origins. Visible bilateral MCA and ACA branches are within normal limits. Posterior circulation: Antegrade flow in the distal vertebral arteries and basilar which appear irregular but without significant stenosis. AICA, SCA, left PCA origins are patent. Fetal type right PCA origin is patent.  Bilateral PCA branches are within normal limits. Anatomic variants: Fetal type right PCA origin. Other: Brain MRI today is reported separately. IMPRESSION: 1. Head MRA is positive for evidence of intracranial atherosclerosis, but negative for large vessel occlusion or hemodynamically significant stenosis. 2. MRI today is reported separately. Electronically Signed   By: Odessa Fleming M.D.   On: 04/07/2023 13:02   MR BRAIN WO CONTRAST Result Date: 04/07/2023 CLINICAL DATA:  63 year old female TIA. EXAM: MRI HEAD WITHOUT CONTRAST TECHNIQUE: Multiplanar, multiecho pulse sequences of the brain and surrounding structures were obtained without intravenous contrast. COMPARISON:  Intracranial MRA today reported separately. FINDINGS: Brain: Normal cerebral volume for age. Cavum septum pellucidum, normal variant. Scattered small foci of restricted diffusion in both cerebral hemispheres including the bilateral MCA and PCA territories. Deep gray nuclei, posterior fossa appear relatively spared although questionable subtle cerebellar involvement such as on series 7, image 9. Associated faint cytotoxic edema of the larger lesions (such as left motor or pre motor strip series 11, image 20. No acute hemorrhage identified, but there is a superimposed chronic microhemorrhage at the junction of the posterior left temporal and occipital lobe white matter on SWI series 20, image 22. No other chronic cerebral blood products identified, although widely scattered small nonspecific white matter T2 and FLAIR hyperintense foci. No chronic cortical encephalomalacia identified. No midline shift, mass effect, evidence of mass lesion, ventriculomegaly, extra-axial collection or acute intracranial hemorrhage. Cervicomedullary junction and pituitary are within normal limits. Vascular: Major intracranial vascular flow voids are preserved. MRA is reported separately. Skull and upper cervical spine: Negative for age visible cervical spine. Visualized bone  marrow signal is within normal limits. Sinuses/Orbits: Negative. Other: Grossly normal visible internal auditory structures. Negative visible scalp and face. IMPRESSION: 1. Positive for multiple small and widely scattered scattered acute or subacute infarcts in both cerebral hemispheres. No associated hemorrhage or mass effect. The pattern is suspicious for recent embolic event. 2. Underlying chronic white matter signal changes which are nonspecific but due to small vessel disease, including a solitary chronic microhemorrhage in the left hemisphere. 3. Intracranial MRA today is reported separately. Electronically Signed   By: Odessa Fleming M.D.   On: 04/07/2023 12:59     Scheduled Meds:   stroke: early stages of recovery book   Does not apply Once   insulin aspart  0-9 Units Subcutaneous Q6H   levothyroxine  25 mcg Oral Q0600   pantoprazole (PROTONIX) IV  40 mg Intravenous QHS     LOS: 1 day    Time spent: 55 minutes    Alnisa Hasley Hoover Brunette, DO [facility]  If 7PM-7AM, please contact night-coverage [email or web address] 04/08/2023, 11:58 AM

## 2023-04-08 NOTE — Progress Notes (Signed)
 SLP Cancellation Note  Patient Details Name: Becky [name] MRN: [id number] DOB: Nov 15, 1960   Cancelled treatment:       Reason Eval/Treat Not Completed: SLP screened, no needs identified, will sign off. Pt's speech, language and cognition are functioning at baseline. Our service will sign off. Thank you for this referral,  Bernita Beckstrom H. Romie Levee, CCC-SLP Speech Language Pathologist    Georgetta Haber 04/08/2023, 1:41 PM

## 2023-04-08 NOTE — Progress Notes (Signed)
 Nurse at bedside,patient's heart rate high as 140's,blood pressure 146/78,AFIB on telemetry. No c/o pain or discomfort noted.Dr Sherryll Burger notified.See new orders. Plan of care on going.

## 2023-04-08 NOTE — TOC Initial Note (Signed)
 Transition of Care [facility]) - Initial/Assessment Note    Patient Details  Name: Becky [name] MRN: [id number] Date of Birth: Jul 13, 1960  Transition of Care [facility]) CM/SW Contact:    Villa Herb, LCSWA Phone Number: 04/08/2023, 11:11 AM  Clinical Narrative:                 [name] updated that PT is recommending OP PT for pt when medically stable for D/C. [name] spoke with pt about this and she is agreeable to this referral being sent to the [facility] OP PT clinic. [name] to send referral to clinic at this time. TOC to follow.   Expected Discharge Plan: OP Rehab Barriers to Discharge: Continued Medical Work up   Patient Goals and CMS Choice Patient states their goals for this hospitalization and ongoing recovery are:: return home CMS Medicare.gov Compare Post Acute Care list provided to:: Patient Choice offered to / list presented to : Patient      Expected Discharge Plan and Services In-house Referral: Clinical Social Work Discharge Planning Services: CM Consult   Living arrangements for the past 2 months: Single Family Home                                      Prior Living Arrangements/Services Living arrangements for the past 2 months: Single Family Home Lives with:: Self Patient language and need for interpreter reviewed:: Yes Do you feel safe going back to the place where you live?: Yes      Need for Family Participation in Patient Care: Yes (Comment) Care giver support system in place?: Yes (comment)   Criminal Activity/Legal Involvement Pertinent to Current Situation/Hospitalization: No - Comment as needed  Activities of Daily Living   ADL Screening (condition at time of admission) Independently performs ADLs?: Yes (appropriate for developmental age) Is the patient deaf or have difficulty hearing?: No Does the patient have difficulty seeing, even when wearing glasses/contacts?: No Does the patient have difficulty concentrating, remembering, or making decisions?:  No  Permission Sought/Granted                  Emotional Assessment Appearance:: Appears stated age Attitude/Demeanor/Rapport: Engaged Affect (typically observed): Accepting Orientation: : Oriented to Self, Oriented to Place, Oriented to  Time, Oriented to Situation Alcohol / Substance Use: Not Applicable Psych Involvement: No (comment)  Admission diagnosis:  Hematochezia [K92.1] Stroke [facility]) [I63.9] Cerebrovascular accident (CVA), unspecified mechanism (HCC) [I63.9] Patient Active Problem List   Diagnosis Date Noted   Stroke (HCC) 04/07/2023   Weakness of right quadriceps muscle 06/05/2022   ASCUS of cervix with negative high risk HPV 04/14/2022   Encounter for screening fecal occult blood testing 04/07/2022   LGSIL on Pap smear of cervix 04/07/2022   Encounter for gynecological examination with Papanicolaou smear of cervix 04/07/2022   S/P total knee arthroplasty, right 01/29/2022   Pain from implanted hardware 11/08/2021   Painful orthopaedic hardware (HCC) 09/19/2021   Prolapsed internal hemorrhoids, grade 3 05/02/2021   LGSIL of cervix of undetermined significance 03/04/2021   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   Vulvar itching 12/07/2020   Prolapsed internal hemorrhoids, grade 2 10/11/2020   Trochanteric bursitis, right hip 10/04/2020   Trochanteric bursitis, left hip 08/02/2020   Unilateral primary osteoarthritis, left knee 08/02/2020   Hemorrhoids 08/01/2020   GI bleeding 07/18/2020   Anemia    Shortness of breath    Rectal bleeding 09/17/2018  Vaginal odor 09/24/2017   Abdominal pain 08/18/2017   Nausea without vomiting 11/17/2016   Current use of long term anticoagulation 11/12/2016   Atrial fibrillation (HCC) 11/07/2016   Coronary artery disease due to lipid rich plaque    RLQ abdominal pain 10/03/2016   Yeast infection 03/13/2016   Fibroids 03/13/2016   Screening for colorectal cancer 11/28/2015   Burning with urination 11/28/2015    Subacute frontal sinusitis 11/28/2015   Leg cramps 10/31/2015   Chest pain 10/31/2015   Varicose 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 07/23/2015   Body aches 11/21/2014   Constipation 11/21/2014   Vaginal itching 03/24/2014   Vaginal discharge 03/24/2014   Pelvic pain 11/02/2013   Elevated cholesterol 11/02/2013   Low back pain 10/20/2013   Stiffness of ankle joint 10/20/2013   Pain in joint, ankle and foot 10/20/2013   Vaginal irritation 05/23/2013   Hematuria 05/23/2013   BV (bacterial vaginosis) 05/23/2013   HEMATOCHEZIA 12/05/2009   Dysphagia 12/05/2009   CHEST WALL PAIN, ANTERIOR 06/23/2007   LEG PAIN 05/18/2007   VAGINAL PRURITUS 04/16/2007   GOITER 03/10/2007   Hypothyroidism 03/10/2007   Type II diabetes mellitus with complication (HCC) 03/10/2007   HYPERLIPIDEMIA 03/10/2007   ANEMIA-IRON DEFICIENCY 03/10/2007   Essential hypertension 03/10/2007   RHINITIS, CHRONIC 03/10/2007   ASTHMA, CHILDHOOD 03/10/2007   Gastroesophageal reflux disease 03/10/2007   PEPTIC ULCER DISEASE 03/10/2007   MENOPAUSAL SYNDROME 03/10/2007   PCP:  Elfredia Nevins, MD Pharmacy:   [phone] - [place], [place] - [address] AT [facility] [address] [place] [address] Phone: [phone] Fax: [phone]  [facility] and [address] [place], [place] - [address] [place] [address] Phone: [phone] Fax: [phone]  [facility] [address], [place] - [address] [place] [address] [place] [address] [place] [address] Phone: [phone] Fax: [phone]     Social Drivers of Health (SDOH) Social History: SDOH Screenings   Food Insecurity: No Food Insecurity (04/07/2023)  Housing: Low Risk  (04/08/2023)  Transportation Needs: No Transportation Needs (04/07/2023)  Utilities: Not At Risk (04/07/2023)  Alcohol Screen: Low Risk  (05/28/2022)  Depression (PHQ2-9): Low Risk  (07/31/2022)  Financial  Resource Strain: Low Risk  (03/24/2023)  Physical Activity: Sufficiently Active (08/25/2022)  Social Connections: Moderately Integrated (04/07/2023)  Stress: No Stress Concern Present (05/28/2022)  Tobacco Use: Medium Risk (04/07/2023)  Health Literacy: Adequate Health Literacy (10/16/2022)   SDOH Interventions:     Readmission Risk Interventions    04/08/2023   11:09 AM  Readmission Risk Prevention Plan  Transportation Screening Complete  Home Care Screening Complete  Medication Review (RN CM) Complete

## 2023-04-08 NOTE — Plan of Care (Signed)

## 2023-04-08 NOTE — Evaluation (Signed)
 Occupational Therapy Evaluation Patient Details Name: Becky [name] MRN: [id number] DOB: 1960/12/25 Today's Date: 04/08/2023   History of Present Illness   Becky [name] is a 63 y.o. female with medical history significant for diabetes, hypertension, atrial fibrillation, coronary artery disease, hypothyroidism.  Patient presented to the ED with complaints of numbness to her right hand, and left toes, she also reports intermittent episodes of dizziness, blurred vision, headache.  Numbness started about 2 days ago.  Reports intermittent dizziness over the past month.  Dizziness and blurry vision are not dependent on position.  No facial asymmetry, no change in vision, she reports some weakness of her extremities but is not specific.  She reports over the past few weeks, she has been having bloody stools.  She reports sometimes she does not see the stool just a lot of blood.  She denies black stools, no vomiting or vomiting of blood.  Her Xarelto was held, she last took it- 3/21. (per MD)     Clinical Impressions Pt agreeable to OT and PT co-evaluation. Pt appears to be at or near functional baseline. Reports recent shoulder surgery leading to R shoulder deficits. Generally weak without any significant asymmetries. Able to ambulate independently and complete ADL's with mild labored effort. Pt left in the chair with call bell within reach. Pt is not recommended for further acute OT services and will be discharged to care of nursing staff for remaining length of stay.                Functional Status Assessment   Patient has not had a recent decline in their functional status     Equipment Recommendations   None recommended by OT             Precautions/Restrictions   Precautions Precautions: Fall Recall of Precautions/Restrictions: Intact Restrictions Weight Bearing Restrictions Per Provider Order: No     Mobility Bed Mobility Overal bed mobility: Independent                   Transfers Overall transfer level: Independent                 General transfer comment: mild labored effort; pt reports baseline ambulation for transfers.      Balance Overall balance assessment: Mild deficits observed, not formally tested                                         ADL either performed or assessed with clinical judgement   ADL Overall ADL's : Independent                                             Vision Baseline Vision/History: 1 Wears glasses Ability to See in Adequate Light: 1 Impaired Patient Visual Report: Blurring of vision;Diplopia Vision Assessment?: No apparent visual deficits (Pt reports vision is back to normal right now.)     Perception Perception: Not tested       Praxis Praxis: Not tested       Pertinent Vitals/Pain Pain Assessment Pain Assessment: Faces Faces Pain Scale: Hurts a little bit Pain Location: head Pain Descriptors / Indicators: Headache Pain Intervention(s): Monitored during session, Repositioned     Extremity/Trunk Assessment Upper Extremity Assessment Upper Extremity Assessment: Generalized weakness;RUE deficits/detail RUE Deficits /  Details: R UE limited from previous shoulder surgery. 3-/5 shoulder flexion and abduction.   Lower Extremity Assessment Lower Extremity Assessment: Defer to PT evaluation   Cervical / Trunk Assessment Cervical / Trunk Assessment: Normal   Communication Communication Communication: No apparent difficulties   Cognition Arousal: Alert Behavior During Therapy: WFL for tasks assessed/performed Cognition: No apparent impairments                               Following commands: Intact       Cueing  General Comments   Cueing Techniques: Verbal cues                 Home Living Family/patient expects to be discharged to:: Private residence Living Arrangements: Spouse/significant other Available Help at  Discharge: Family;Available 24 hours/day Type of Home: Mobile home Home Access: Stairs to enter Entrance Stairs-Number of Steps: 4 to 5 Entrance Stairs-Rails: Can reach both;Right;Left Home Layout: One level     Bathroom Shower/Tub: Producer, television/film/video: Standard Bathroom Accessibility: Yes How Accessible: Accessible via walker Home Equipment: Rolling Walker (2 wheels);Cane - single point;Rollator (4 wheels);BSC/3in1;Shower seat          Prior Functioning/Environment Prior Level of Function : Independent/Modified Independent             Mobility Comments: Short distance community ambulator without AD ADLs Comments: Independent                            Co-evaluation PT/OT/SLP Co-Evaluation/Treatment: Yes Reason for Co-Treatment: To address functional/ADL transfers   OT goals addressed during session: ADL's and self-care      AM-PAC OT "6 Clicks" Daily Activity     Outcome Measure Help from another person eating meals?: None Help from another person taking care of personal grooming?: None Help from another person toileting, which includes using toliet, bedpan, or urinal?: None Help from another person bathing (including washing, rinsing, drying)?: None Help from another person to put on and taking off regular upper body clothing?: None Help from another person to put on and taking off regular lower body clothing?: None 6 Click Score: 24   End of Session    Activity Tolerance: Patient tolerated treatment well Patient left: in chair;with call bell/phone within reach  OT Visit Diagnosis: Other symptoms and signs involving the nervous system (R29.898);Low vision, both eyes (H54.2)                Time: 8295-6213 OT Time Calculation (min): 14 min Charges:  OT General Charges $OT Visit: 1 Visit OT Evaluation $OT Eval Low Complexity: 1 Low  Lanyla Costello OT, MOT  Danie Chandler 04/08/2023, 9:34 AM

## 2023-04-08 NOTE — Consult Note (Signed)
 I connected with  Becky [name] on 04/08/23 by a video enabled telemedicine application and verified that I am speaking with the correct person using two identifiers.   I discussed the limitations of evaluation and management by telemedicine. The patient expressed understanding and agreed to proceed.  Location of patient: [facility] Location of physician: [facility]   Neurology Consultation Reason for Consult: stroke Referring Physician: Dr Kennis Carina  CC: right hand numbness  History is obtained from:patient, daughter, chart review  HPI: Becky [name] is a 63 y.o. female with PMH of A fib on xarelto which was held few days ago due to rectal bleeding, HTN, , HLD who presented with right hand numbness for 2-3 days, feeling dizzy with blurred vision at times. MRI brain was done which showed acute strokes. Therefore neurology was consulted. Additionally GI planning colonoscopy and requested neuro clearance.  LKN: unclear, 2-3 days ago  Event happened at home No tpa as outside window No thrombectomy as no LVO mRS 0  ROS: All other systems reviewed and negative except as noted in the HPI.   Past Medical History:  Diagnosis Date   Arthritis    Asthma    Back pain    Body aches 11/21/2014   BV (bacterial vaginosis) 05/23/2013   Constipation 11/21/2014   Dysrhythmia    a fib   Elevated cholesterol 11/02/2013   Fibroids 03/13/2016   GERD (gastroesophageal reflux disease)    Hematuria 05/23/2013   Hyperlipidemia    Hypertension    Hypothyroidism    LGSIL of cervix of undetermined significance 03/04/2021   03/04/21 +HPV 16/other , will get colpo, per ASCCP guidelines, immediate CIN3+risjk is 5.65%   Migraines    PAF (paroxysmal atrial fibrillation) (HCC)    a. diagnosed in 11/2016 --> started on Xarelto for anticoagulation   Pelvic pain in female 11/02/2013   Plantar fasciitis of right foot    Pre-diabetes    Sleep apnea    dont use cpap says  causes sinus infection   Thyroid disease    Vaginal discharge 03/24/2014   Vaginal irritation 05/23/2013   Vaginal itching 03/24/2014   Vaginal Pap smear, abnormal     Family History  Problem Relation Age of Onset   Heart failure Mother    Hypertension Mother    Diabetes Mother    Heart attack Mother 40   Heart failure Father    Hypertension Father    Heart attack Father 57   Hypertension Sister    Other Sister        blocked artery in neck; knee replacement   Hypertension Sister    Diabetes Sister    Sudden Cardiac Death Brother 40   Heart disease Brother 16       triple bypass surgery   Colon cancer Neg Hx    Inflammatory bowel disease Neg Hx     Social History:  reports that she quit smoking about 20 years ago. Her smoking use included cigarettes. She started smoking about 48 years ago. She has been exposed to tobacco smoke. She has never used smokeless tobacco. She reports that she does not drink alcohol and does not use drugs.   Medications Prior to Admission  Medication Sig Dispense Refill Last Dose/Taking   acetaminophen (TYLENOL) 500 MG tablet Take 1,000 mg by mouth every 6 (six) hours as needed for mild pain (pain score 1-3).   04/05/2023   albuterol (VENTOLIN HFA) 108 (90 Base) MCG/ACT inhaler Inhale 2 puffs into the  lungs every 4 (four) hours as needed for wheezing or shortness of breath. 18 g 0 04/06/2023 Bedtime   Cholecalciferol (VITAMIN D-3 PO) Take 1 tablet by mouth 3 (three) times a week.   Past Month   dexlansoprazole (DEXILANT) 60 MG capsule Take 1 capsule (60 mg total) by mouth daily. 90 capsule 3 Taking   diltiazem (CARDIZEM CD) 360 MG 24 hr capsule TAKE 1 CAPSULE BY MOUTH EVERY DAY 90 capsule 2 04/06/2023 Bedtime   diltiazem (TIAZAC) 360 MG 24 hr capsule Take 360 mg by mouth daily.   Taking   EPINEPHrine 0.3 mg/0.3 mL IJ SOAJ injection Inject 0.3 mg into the muscle once as needed for anaphylaxis.   Unknown   glimepiride (AMARYL) 2 MG tablet Take 2 mg by  mouth daily with breakfast.   04/06/2023 Bedtime   KLOR-CON M20 20 MEQ tablet Take 20 mEq by mouth daily.   04/05/2023   linaclotide (LINZESS) 72 MCG capsule Take 1 capsule (72 mcg total) by mouth daily before breakfast. 90 capsule 3 Past Month   lubiprostone (AMITIZA) 24 MCG capsule TAKE 1 CAPSULE (24 MCG TOTAL) BY MOUTH 2 (TWO) TIMES DAILY WITH A MEAL. 180 capsule 3 Past Month   magnesium oxide (MAG-OX) 400 (240 Mg) MG tablet Take 400 mg by mouth daily.   Past Month   metoprolol tartrate (LOPRESSOR) 50 MG tablet TAKE 1 TABLET BY MOUTH TWICE A DAY 180 tablet 2 04/06/2023 Bedtime   montelukast (SINGULAIR) 10 MG tablet Take 10 mg by mouth at bedtime.   04/06/2023 Bedtime   NON FORMULARY Pt uses a cpap nightly   04/06/2023 Bedtime   olmesartan (BENICAR) 40 MG tablet Take 1 tablet (40 mg total) by mouth daily. 90 tablet 3 04/06/2023 Bedtime   pantoprazole (PROTONIX) 40 MG tablet TAKE 1 TABLET (40 MG TOTAL) BY MOUTH DAILY TAKE 30 MINUTES BEFORE BREAKFAST (Patient taking differently: Take 40 mg by mouth daily as needed (heartburn). Take 30 minutes before breakfast) 30 tablet 0 Past Week   rosuvastatin (CRESTOR) 10 MG tablet TAKE 1 TABLET BY MOUTH EVERY DAY 90 tablet 1 04/06/2023 Bedtime   spironolactone (ALDACTONE) 25 MG tablet TAKE 1 TABLET BY MOUTH EVERY DAY 90 tablet 1 04/06/2023 Bedtime   SYNTHROID 25 MCG tablet Take 25 mcg by mouth daily.   04/06/2023 Morning   tiZANidine (ZANAFLEX) 4 MG tablet Take 4 mg by mouth every 6 (six) hours as needed for muscle spasms.   Past Week   rivaroxaban (XARELTO) 20 MG TABS tablet Take 1 tablet (20 mg total) by mouth daily. (Patient not taking: Reported on 04/07/2023) 90 tablet 1 Not Taking     Exam: Current vital signs: BP (!) 146/78 (BP Location: Left Arm)   Pulse 84   Temp 98.1 F (36.7 C) (Oral)   Resp 18   Ht 5\' 7"  (1.702 m)   Wt 115.6 kg   SpO2 100%   BMI 39.92 kg/m  Vital signs in last 24 hours: Temp:  [97.8 F (36.6 C)-98.9 F (37.2 C)] 98.1 F (36.7 C)  (04/02 1401) Pulse Rate:  [42-145] 84 (04/02 1401) Resp:  [16-20] 18 (04/02 1401) BP: (116-154)/(30-85) 146/78 (04/02 1401) SpO2:  [93 %-100 %] 100 % (04/02 1401) FiO2 (%):  [21 %] 21 % (04/01 2159) Weight:  [115.6 kg] 115.6 kg (04/01 1710)   Physical Exam  Constitutional: Appears well-developed and well-nourished.  Psych: Affect appropriate to situation Neuro: Aox3, no aphasia, CN grossly intact, antigravity strength in all extremities, sensory decreased to  light touch in right hand, FTN intact  NIHSS 1  INPUTS: 1A: Level of consciousness --> 0 = Alert; keenly responsive 1B: Ask month and age --> 0 = Both questions right 1C: 'Blink eyes' & 'squeeze hands' --> 0 = Performs both tasks 2: Horizontal extraocular movements --> 0 = Normal 3: Visual fields --> 0 = No visual loss 4: Facial palsy --> 0 = Normal symmetry 5A: Left arm motor drift --> 0 = No drift for 10 seconds 5B: Right arm motor drift --> 0 = No drift for 10 seconds 6A: Left leg motor drift --> 0 = No drift for 5 seconds 6B: Right leg motor drift --> 0 = No drift for 5 seconds 7: Limb Ataxia --> 0 = No ataxia 8: Sensation --> 1 = Mild-moderate loss: less sharp/more dull  9: Language/aphasia --> 0 = Normal; no aphasia 10: Dysarthria --> 0 = Normal 11: Extinction/inattention --> 0 = No abnormality   I have reviewed labs in epic and the results pertinent to this consultation are: CBC:  Recent Labs  Lab 04/05/23 2041 04/07/23 1108 04/07/23 1915 04/08/23 0455  WBC 7.2 5.9  --  5.7  NEUTROABS 3.3 3.1  --   --   HGB 11.1* 10.9* 11.5* 10.5*  HCT 33.9* 33.5* 34.8* 31.5*  MCV 94.7 94.6  --  93.2  PLT 283 296  --  280    Basic Metabolic Panel:  Lab Results  Component Value Date   NA 139 04/08/2023   K 3.7 04/08/2023   CO2 23 04/08/2023   GLUCOSE 98 04/08/2023   BUN 9 04/08/2023   CREATININE 1.04 (H) 04/08/2023   CALCIUM 9.3 04/08/2023   GFRNONAA >60 04/08/2023   GFRAA 68 07/18/2019   Lipid Panel:  Lab  Results  Component Value Date   LDLCALC 93 04/08/2023   HgbA1c:  Lab Results  Component Value Date   HGBA1C 5.9 (H) 04/07/2023   Urine Drug Screen:     Component Value Date/Time   LABOPIA NONE DETECTED 04/07/2023 1518   COCAINSCRNUR NONE DETECTED 04/07/2023 1518   LABBENZ NONE DETECTED 04/07/2023 1518   AMPHETMU NONE DETECTED 04/07/2023 1518   THCU NONE DETECTED 04/07/2023 1518   LABBARB NONE DETECTED 04/07/2023 1518    Alcohol Level     Component Value Date/Time   ETH <10 04/07/2023 1108     I have reviewed the images obtained:  CT angio Head and Neck with contrast 04/07/2023: No large vessel occlusion. Moderate stenosis of the posterior P2 segment right PCA. Otherwise no significant stenosis of the arteries in the head and neck. No acute intracranial hemorrhage.   MRI Brain without contrast 04/07/2023:  Positive for multiple small and widely scattered scattered acute or subacute infarcts in both cerebral hemispheres. No associated hemorrhage or mass effect. The pattern is suspicious for recent embolic event. Underlying chronic white matter signal changes which are nonspecific but due to small vessel disease, including a solitary chronic microhemorrhage in the left hemisphere.  MRA head without contrast 04/07/2023:  Head MRA is positive for evidence of intracranial atherosclerosis, but negative for large vessel occlusion or hemodynamically significant stenosis.     ASSESSMENT/PLAN: 63 y.o. female with PMH of A fib on xarelto which was held few days ago due to rectal bleeding who presented with right hand numbness for 2-3 days, feeling dizzy with blurred vision at times. MRI brain was done which showed acute strokes.   Acute ischemic strokes - Etiology: embolic   Recommendations: - Size  of strokes is small. Therefore, agree with colonoscopy asap. - If its possible to resume anticoagulation after colonoscopy, please resume xarelto asap - If  not, please use asa 81 mg daily and  plavix 75mg  daily in the meantime.  If Plavix cannot be used due to pending procedure, okay to use aspirin 325 mg daily instead - If patient needs to stay off anticoagulation in the bear future, please refer to cardiology for watchman's device - Discussed with patient and daughter that being off anticoagulation significantly increases her risk of stroke recurrence and educated about signs of stroke. Recommend return to ER asap for any new signs of stroke if unable to resume anticoagulation - PT/OT - Goal BP: normotension - Discussed plan with Dr Sherryll Burger via secure chat   Thank you for allowing [place] to participate in the care of this patient. If you have any further questions, please contact  me or neurohospitalist.   Lindie Spruce Epile[facility] neuro[facility]ist

## 2023-04-09 ENCOUNTER — Inpatient Hospital Stay (HOSPITAL_COMMUNITY)

## 2023-04-09 DIAGNOSIS — I6389 Other cerebral infarction: Secondary | ICD-10-CM | POA: Diagnosis not present

## 2023-04-09 DIAGNOSIS — I639 Cerebral infarction, unspecified: Secondary | ICD-10-CM | POA: Diagnosis not present

## 2023-04-09 DIAGNOSIS — K921 Melena: Secondary | ICD-10-CM

## 2023-04-09 DIAGNOSIS — D649 Anemia, unspecified: Secondary | ICD-10-CM

## 2023-04-09 LAB — CBC
HCT: 30.8 % — ABNORMAL LOW (ref 36.0–46.0)
Hemoglobin: 9.9 g/dL — ABNORMAL LOW (ref 12.0–15.0)
MCH: 30.2 pg (ref 26.0–34.0)
MCHC: 32.1 g/dL (ref 30.0–36.0)
MCV: 93.9 fL (ref 80.0–100.0)
Platelets: 263 10*3/uL (ref 150–400)
RBC: 3.28 MIL/uL — ABNORMAL LOW (ref 3.87–5.11)
RDW: 13 % (ref 11.5–15.5)
WBC: 6.5 10*3/uL (ref 4.0–10.5)
nRBC: 0 % (ref 0.0–0.2)

## 2023-04-09 LAB — BASIC METABOLIC PANEL WITH GFR
Anion gap: 9 (ref 5–15)
BUN: 8 mg/dL (ref 8–23)
CO2: 24 mmol/L (ref 22–32)
Calcium: 9.2 mg/dL (ref 8.9–10.3)
Chloride: 104 mmol/L (ref 98–111)
Creatinine, Ser: 1.14 mg/dL — ABNORMAL HIGH (ref 0.44–1.00)
GFR, Estimated: 55 mL/min — ABNORMAL LOW (ref >60)
Glucose, Bld: 109 mg/dL — ABNORMAL HIGH (ref 70–99)
Potassium: 3.7 mmol/L (ref 3.5–5.1)
Sodium: 137 mmol/L (ref 135–145)

## 2023-04-09 LAB — GLUCOSE, CAPILLARY
Glucose-Capillary: 78 mg/dL (ref 70–99)
Glucose-Capillary: 86 mg/dL (ref 70–99)
Glucose-Capillary: 108 mg/dL — ABNORMAL HIGH (ref 70–99)

## 2023-04-09 LAB — ECHOCARDIOGRAM COMPLETE
AR max vel: 2.5 cm2
AV Area VTI: 2.24 cm2
AV Area mean vel: 2.46 cm2
AV Mean grad: 8 mmHg
AV Peak grad: 16.9 mmHg
Ao pk vel: 2.06 m/s
Area-P 1/2: 2.95 cm2
Height: 67 in
P 1/2 time: 591 ms
S' Lateral: 3.1 cm
Weight: 4078.4 [oz_av]

## 2023-04-09 LAB — MAGNESIUM: Magnesium: 1.8 mg/dL (ref 1.7–2.4)

## 2023-04-09 MED ORDER — BUTALBITAL-APAP-CAFFEINE 50-325-40 MG PO TABS
2.0000 | ORAL_TABLET | Freq: Once | ORAL | Status: AC
  Administered 2023-04-09: 2 via ORAL
  Filled 2023-04-09: qty 2

## 2023-04-09 MED ORDER — ASPIRIN 81 MG PO TBEC
81.0000 mg | DELAYED_RELEASE_TABLET | Freq: Every day | ORAL | Status: DC
  Administered 2023-04-09: 81 mg via ORAL
  Filled 2023-04-09: qty 1

## 2023-04-09 MED ORDER — ASPIRIN 325 MG PO TBEC
325.0000 mg | DELAYED_RELEASE_TABLET | Freq: Every day | ORAL | Status: DC
  Administered 2023-04-09: 325 mg via ORAL
  Filled 2023-04-09 (×2): qty 1

## 2023-04-09 MED ORDER — PEG 3350-KCL-NA BICARB-NACL 420 G PO SOLR
4000.0000 mL | Freq: Once | ORAL | Status: AC
  Administered 2023-04-09: 4000 mL via ORAL

## 2023-04-09 MED ORDER — SODIUM CHLORIDE 0.9 % IV SOLN: INTRAVENOUS | Status: DC

## 2023-04-09 MED ORDER — BISACODYL 5 MG PO TBEC
10.0000 mg | DELAYED_RELEASE_TABLET | Freq: Once | ORAL | Status: AC
  Administered 2023-04-09: 10 mg via ORAL
  Filled 2023-04-09: qty 2

## 2023-04-09 MED ORDER — LORATADINE 10 MG PO TABS
10.0000 mg | ORAL_TABLET | Freq: Every day | ORAL | Status: DC
Start: 2023-04-09 — End: 2023-04-10
  Administered 2023-04-09 – 2023-04-10 (×2): 10 mg via ORAL
  Filled 2023-04-09 (×2): qty 1

## 2023-04-09 NOTE — Progress Notes (Signed)
 PROGRESS NOTE    Becky [name]  UJW:[id number] DOB: 23-Aug-1960 DOA: 04/07/2023 PCP: Elfredia Nevins, MD   Brief Narrative:    Becky [name] is a 63 y.o. female with medical history significant for diabetes, hypertension, atrial fibrillation, coronary artery disease, hypothyroidism. Patient presented to the ED with complaints of numbness to her right hand, and left toes, she also reports intermittent episodes of dizziness, blurred vision, headache.  Numbness started about 2 days ago.  Reports intermittent dizziness over the past month.  Dizziness and blurry vision are not dependent on position.  No facial asymmetry, no change in vision, she reports some weakness of her extremities but is not specific. She reports over the past few weeks, she has been having bloody stools.  She reports sometimes she does not see the stool just a lot of blood.  She denies black stools, no vomiting or vomiting of blood. Her Xarelto was held, she last took it- 3/21.  Neurology has cleared patient for colonoscopy which will take place 4/4.  Assessment & Plan:   Principal Problem:   Stroke [facility]) Active Problems:   Atrial fibrillation (HCC)   Hypothyroidism   Type II diabetes mellitus with complication (HCC)   Essential hypertension   Rectal bleeding  Assessment and Plan:  Stroke [facility]) Presenting with multiple symptoms of right arm numbness, left leg numbness, blurry vision, intermittent dizziness.  Dizziness may be secondary to rectal bleeding.  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 No associated hemorrhage or mass effect. The pattern is suspicious for recent embolic event. -Appreciate neurology evaluation -Start aspirin 325 mg daily - HgbA1c 5.9%, lipid panel with LDL at 93 -Echocardiogram pending -PT recommending outpatient services   Atrial fibrillation (HCC) Currently in sinus rhythm. -Holding diltiazem  and metoprolol in setting of GI bleed, also considering possible watershed infarct -Hold Eliquis, on full dose aspirin for now   Rectal bleeding Reports multiple episodes of bleeding per rectum over the past few weeks.  With intermittent episodes of dizziness.  Hemoglobin 10.9, baseline over the past year 11-12.  She denies melena or hematochezia. -GI planning for colonoscopy 4/4 -IV PPI daily -Clear Liquid diet -Xarelto currently on hold - Lr 75cc/hr x 15hrs -trend H&H, currently stable   Essential hypertension Blood pressure systolic 130s to 562Z. -Resumed home diltiazem and metoprolol   Type II diabetes mellitus with complication (HCC) - HgbA1c 5.9% - SSI- S -Hold glipizide.   Hypothyroidism Resume Synthroid  Obesity, class II -BMI 39.92    DVT prophylaxis: SCDs Code Status: Full Family Communication: None at bedside Disposition Plan:  Status is: Inpatient Remains inpatient appropriate because: Need for IV medications and inpatient evaluation.   Consultants:  Neurology GI  Procedures:  None  Antimicrobials:  None   Subjective: Patient seen and evaluated today with no new acute complaints or concerns. No acute concerns or events noted overnight.  Noted to have bloody bowel movement overnight once again.  She states that her right arm symptoms have improved.  Objective: Vitals:   04/08/23 1551 04/08/23 2007 04/09/23 0404 04/09/23 0815  BP: (!) 155/88 (!) 151/59 (!) 121/54 (!) 109/50  Pulse: (!) 110 62 63 63  Resp:  18 20 19   Temp: 98.6 F (37 C) 98.4 F (36.9 C) 98.4 F (36.9 C) 98.7 F (37.1 C)  TempSrc: Oral Oral Oral Oral  SpO2: 100% 100% 100% 99%  Weight:      Height:  Intake/Output Summary (Last 24 hours) at 04/09/2023 1037 Last data filed at 04/09/2023 0500 Gross per 24 hour  Intake 240 ml  Output --  Net 240 ml   Filed Weights   04/07/23 1023 04/07/23 1710  Weight: 116.1 kg 115.6 kg    Examination:  General exam: Appears calm  and comfortable  Respiratory system: Clear to auscultation. Respiratory effort normal. Cardiovascular system: S1 & S2 heard, irregular heart rate.  Gastrointestinal system: Abdomen is soft Central nervous system: Alert and awake Extremities: No edema Skin: No significant lesions noted Psychiatry: Flat affect.    Data Reviewed: I have personally reviewed following labs and imaging studies  CBC: Recent Labs  Lab 04/05/23 2041 04/07/23 1108 04/07/23 1915 04/08/23 0455 04/09/23 0451  WBC 7.2 5.9  --  5.7 6.5  NEUTROABS 3.3 3.1  --   --   --   HGB 11.1* 10.9* 11.5* 10.5* 9.9*  HCT 33.9* 33.5* 34.8* 31.5* 30.8*  MCV 94.7 94.6  --  93.2 93.9  PLT 283 296  --  280 263   Basic Metabolic Panel: Recent Labs  Lab 04/05/23 2041 04/07/23 1108 04/08/23 0455 04/09/23 0451  NA 139 140 139 137  K 4.1 3.8 3.7 3.7  CL 107 106 106 104  CO2 23 25 23 24   GLUCOSE 74 96 98 109*  BUN 17 11 9 8   CREATININE 1.46* 1.08* 1.04* 1.14*  CALCIUM 9.5 9.8 9.3 9.2  MG  --   --  1.8 1.8   GFR: Estimated Creatinine Clearance: 68.1 mL/min (A) (by C-G formula based on SCr of 1.14 mg/dL (H)). Liver Function Tests: Recent Labs  Lab 04/05/23 2041 04/07/23 1108  AST 17 17  ALT 16 15  ALKPHOS 64 58  BILITOT 0.8 0.8  PROT 6.9 6.9  ALBUMIN 4.1 4.0   No results for input(s): "LIPASE", "AMYLASE" in the last 168 hours. No results for input(s): "AMMONIA" in the last 168 hours. Coagulation Profile: Recent Labs  Lab 04/07/23 1108  INR 1.8*   Cardiac Enzymes: No results for input(s): "CKTOTAL", "CKMB", "CKMBINDEX", "TROPONINI" in the last 168 hours. BNP (last 3 results) No results for input(s): "PROBNP" in the last 8760 hours. HbA1C: Recent Labs    04/07/23 1108  HGBA1C 5.9*   CBG: Recent Labs  Lab 04/08/23 0558 04/08/23 0810 04/08/23 1132 04/08/23 1659 04/09/23 0231  GLUCAP 105* 100* 117* 72 78   Lipid Profile: Recent Labs    04/08/23 0455  CHOL 160  HDL 47  LDLCALC 93  TRIG  98  CHOLHDL 3.4   Thyroid Function Tests: No results for input(s): "TSH", "T4TOTAL", "FREET4", "T3FREE", "THYROIDAB" in the last 72 hours. Anemia Panel: No results for input(s): "VITAMINB12", "FOLATE", "FERRITIN", "TIBC", "IRON", "RETICCTPCT" in the last 72 hours. Sepsis Labs: No results for input(s): "PROCALCITON", "LATICACIDVEN" in the last 168 hours.  No results found for this or any previous visit (from the past 240 hours).       Radiology Studies: ECHOCARDIOGRAM COMPLETE Result Date: 04/09/2023    ECHOCARDIOGRAM REPORT   Patient Name:   Becky [name] Date of Exam: 04/09/2023 Medical Rec #:  [id number]         Height:       67.0 in Accession #:    [id number]        Weight:       254.9 lb Date of Birth:  08-07-1960         BSA:          2.242  m Patient Age:    62 years          BP:           109/50 mmHg Patient Gender: F                 HR:           66 bpm. [facility] Procedure: 2D Echo, Cardiac Doppler and Color Doppler (Both Spectral and Color            Flow Doppler were utilized during procedure). Indications:    Stroke l63.9  History:        Patient has prior history of Echocardiogram examinations, most                 recent 04/01/2021. Arrythmias:Atrial Fibrillation; Risk                 Factors:Sleep Apnea, Dyslipidemia, Diabetes and Hypertension.  Sonographer:    Celesta Gentile RCS Referring Phys: [phone] Heloise Beecham EMOKPAE IMPRESSIONS  1. Left ventricular ejection fraction, by estimation, is 60 to 65%. The left ventricle has normal function. The left ventricle has no regional wall motion abnormalities. Left ventricular diastolic parameters are consistent with Grade II diastolic dysfunction (pseudonormalization). Elevated left atrial pressure.  2. Right ventricular systolic function is normal. The right ventricular size is normal. Tricuspid regurgitation signal is inadequate for assessing PA pressure.  3. Left atrial size was mildly dilated.  4. The mitral valve is normal  in structure. Trivial mitral valve regurgitation. No evidence of mitral stenosis.  5. The aortic valve is tricuspid. Aortic valve regurgitation is mild to moderate. No aortic stenosis is present.  6. The inferior vena cava is dilated in size with >50% respiratory variability, suggesting right atrial pressure of 8 mmHg. FINDINGS  Left Ventricle: Left ventricular ejection fraction, by estimation, is 60 to 65%. The left ventricle has normal function. The left ventricle has no regional wall motion abnormalities. The left ventricular internal cavity size was normal in size. There is  no left ventricular hypertrophy. Left ventricular diastolic parameters are consistent with Grade II diastolic dysfunction (pseudonormalization). Elevated left atrial pressure. Right Ventricle: The right ventricular size is normal. Right vetricular wall thickness was not well visualized. Right ventricular systolic function is normal. Tricuspid regurgitation signal is inadequate for assessing PA pressure. Left Atrium: Left atrial size was mildly dilated. Right Atrium: Right atrial size was normal in size. Pericardium: There is no evidence of pericardial effusion. Mitral Valve: The mitral valve is normal in structure. Trivial mitral valve regurgitation. No evidence of mitral valve stenosis. Tricuspid Valve: The tricuspid valve is normal in structure. Tricuspid valve regurgitation is trivial. No evidence of tricuspid stenosis. Aortic Valve: The aortic valve is tricuspid. Aortic valve regurgitation is mild to moderate. Aortic regurgitation PHT measures 591 msec. No aortic stenosis is present. Aortic valve mean gradient measures 8.0 mmHg. Aortic valve peak gradient measures 16.9  mmHg. Aortic valve area, by VTI measures 2.24 cm. Pulmonic Valve: The pulmonic valve was not well visualized. Pulmonic valve regurgitation is not visualized. No evidence of pulmonic stenosis. Aorta: The aortic root is normal in size and structure. Venous: The inferior  vena cava is dilated in size with greater than 50% respiratory variability, suggesting right atrial pressure of 8 mmHg. IAS/Shunts: No atrial level shunt detected by color flow Doppler.  LEFT VENTRICLE PLAX 2D LVIDd:         5.40 cm   Diastology LVIDs:  3.10 cm   LV e' medial:    6.64 cm/s LV PW:         1.00 cm   LV E/e' medial:  17.2 LV IVS:        1.00 cm   LV e' lateral:   6.96 cm/s LVOT diam:     1.90 cm   LV E/e' lateral: 16.4 LV SV:         107 LV SV Index:   48 LVOT Area:     2.84 cm  RIGHT VENTRICLE RV S prime:     11.70 cm/s TAPSE (M-mode): 2.2 cm LEFT ATRIUM             Index        RIGHT ATRIUM           Index LA diam:        4.60 cm 2.05 cm/m   RA Area:     14.70 cm LA Vol (A2C):   66.1 ml 29.48 ml/m  RA Volume:   33.90 ml  15.12 ml/m LA Vol (A4C):   91.9 ml 40.99 ml/m LA Biplane Vol: 81.2 ml 36.22 ml/m  AORTIC VALVE AV Area (Vmax):    2.50 cm AV Area (Vmean):   2.46 cm AV Area (VTI):     2.24 cm AV Vmax:           205.50 cm/s AV Vmean:          128.000 cm/s AV VTI:            0.478 m AV Peak Grad:      16.9 mmHg AV Mean Grad:      8.0 mmHg LVOT Vmax:         181.00 cm/s LVOT Vmean:        111.000 cm/s LVOT VTI:          0.378 m LVOT/AV VTI ratio: 0.79 AI PHT:            591 msec  AORTA Ao Root diam: 3.50 cm MITRAL VALVE MV Area (PHT): 2.95 cm     SHUNTS MV Decel Time: 257 msec     Systemic VTI:  0.38 m MV E velocity: 114.00 cm/s  Systemic Diam: 1.90 cm MV A velocity: 49.70 cm/s MV E/A ratio:  2.29 Dina Rich MD Electronically signed by Dina Rich MD Signature Date/Time: 04/09/2023/10:33:29 AM    Final    CT ANGIO HEAD NECK W WO CM Result Date: 04/07/2023 CLINICAL DATA:  Transient ischemic attack, dizziness, right arm feels "asleep" for the past few weeks. Headache this morning. Scattered acute and subacute infarcts in the bilateral cerebral hemispheres on same day MRI brain. EXAM: CT ANGIOGRAPHY HEAD AND NECK WITH AND WITHOUT CONTRAST TECHNIQUE: Multidetector CT imaging of  the head and neck was performed using the standard protocol during bolus administration of intravenous contrast. Multiplanar CT image reconstructions and MIPs were obtained to evaluate the vascular anatomy. Carotid stenosis measurements (when applicable) are obtained utilizing NASCET criteria, using the distal internal carotid diameter as the denominator. RADIATION DOSE REDUCTION: This exam was performed according to the departmental dose-optimization program which includes automated exposure control, adjustment of the mA and/or kV according to patient size and/or use of iterative reconstruction technique. CONTRAST:  75mL OMNIPAQUE IOHEXOL 350 MG/ML SOLN COMPARISON:  Same day MRI brain. FINDINGS: CT HEAD FINDINGS Brain: No acute intracranial hemorrhage. Infarcts on same day MRI are not well visualized on the current study. Nonspecific hypoattenuation in the periventricular and  subcortical white matter favored to reflect chronic microvascular ischemic changes. No edema, mass effect, or midline shift. The basilar cisterns are patent. Ventricles: Ventricles are normal in size and configuration. Vascular: No hyperdense vessel. Intracranial atherosclerotic calcifications noted. Skull: No acute or aggressive finding. Sinuses/orbits: The visualized paranasal sinuses are clear. Orbits are symmetric. Other: Mastoid air cells are clear. CTA NECK FINDINGS Aortic arch: Standard configuration of the aortic arch. Imaged portion shows no evidence of aneurysm or dissection. No significant stenosis of the major arch vessel origins. Pulmonary arteries: As permitted by contrast timing, there are no filling defects in the visualized pulmonary arteries. Subclavian arteries: The subclavian arteries are patent bilaterally. Right carotid system: Patent. Mild atherosclerosis at the carotid bifurcation without hemodynamically significant stenosis. No evidence of dissection. Left carotid system: No evidence of dissection, stenosis (50% or  greater), or occlusion. Vertebral arteries: The bilateral vertebral arteries are patent at their origins. There is slightly limited evaluation of the distal V1 and proximal V2 segments of the right vertebral artery due to artifact and adjacent dense venous contrast. Visualized portions of the vertebral arteries are patent to the vertebrobasilar confluence. No evidence of high-grade stenosis or dissection. Skeleton: No acute or aggressive finding noted. Other neck: The visualized airway is patent. No cervical lymphadenopathy. Upper chest: Rounded masslike consolidative opacity involving the right upper lobe measuring 3.0 x 2.7 cm with adjacent patchy ground-glass and consolidative opacities. Review of the MIP images confirms the above findings CTA HEAD FINDINGS ANTERIOR CIRCULATION: The intracranial ICAs are patent bilaterally. Atherosclerosis of the bilateral carotid siphons resulting in mild stenosis. No high-grade stenosis, proximal occlusion, aneurysm, or vascular malformation. MCAs: The middle cerebral arteries are patent bilaterally. ACAs: The anterior cerebral arteries are patent bilaterally. POSTERIOR CIRCULATION: No significant stenosis, proximal occlusion, aneurysm, or vascular malformation. PCAs: Patent bilaterally. Moderate narrowing of the posterior P2 segment of the right PCA. Pcomm: Visualized bilaterally more pronounced on the right. SCAs: The superior cerebellar arteries are patent bilaterally. Basilar artery: Patent AICAs: Patent PICAs: Not well visualized. Vertebral arteries: The intracranial vertebral arteries are patent. Venous sinuses: As permitted by contrast timing, patent. Anatomic variants: None Review of the MIP images confirms the above findings IMPRESSION: No large vessel occlusion. Moderate stenosis of the posterior P2 segment right PCA. Otherwise no significant stenosis of the arteries in the head and neck. No acute intracranial hemorrhage. Infarcts on same day MRI brain are not well  visualized on the current study. 3.0 cm rounded masslike consolidative opacity in the right upper lobe with surrounding patchy ground-glass and consolidative opacities. Findings correspond to radiographic abnormality from 02/11/2023. Reiterate recommendation for dedicated CT chest. Electronically Signed   By: Emily Filbert M.D.   On: 04/07/2023 19:17   MR ANGIO HEAD WO CONTRAST Result Date: 04/07/2023 CLINICAL DATA:  63 year old female TIA. EXAM: MRA HEAD WITHOUT CONTRAST TECHNIQUE: Angiographic images of the Circle of Willis were acquired using MRA technique without intravenous contrast. COMPARISON:  Brain MRI today reported separately FINDINGS: Anterior circulation: Antegrade flow in the distal cervical ICAs and both ICA siphons with evidence of calcified plaque and irregularity, but no hemodynamically significant siphon stenosis. Patent carotid termini. Normal ophthalmic and right posterior communicating artery origins. Patent MCA and ACA origins. Visible bilateral MCA and ACA branches are within normal limits. Posterior circulation: Antegrade flow in the distal vertebral arteries and basilar which appear irregular but without significant stenosis. AICA, SCA, left PCA origins are patent. Fetal type right PCA origin is patent. Bilateral PCA branches are within normal  limits. Anatomic variants: Fetal type right PCA origin. Other: Brain MRI today is reported separately. IMPRESSION: 1. Head MRA is positive for evidence of intracranial atherosclerosis, but negative for large vessel occlusion or hemodynamically significant stenosis. 2. MRI today is reported separately. Electronically Signed   By: Odessa Fleming M.D.   On: 04/07/2023 13:02   MR BRAIN WO CONTRAST Result Date: 04/07/2023 CLINICAL DATA:  63 year old female TIA. EXAM: MRI HEAD WITHOUT CONTRAST TECHNIQUE: Multiplanar, multiecho pulse sequences of the brain and surrounding structures were obtained without intravenous contrast. COMPARISON:  Intracranial MRA today  reported separately. FINDINGS: Brain: Normal cerebral volume for age. Cavum septum pellucidum, normal variant. Scattered small foci of restricted diffusion in both cerebral hemispheres including the bilateral MCA and PCA territories. Deep gray nuclei, posterior fossa appear relatively spared although questionable subtle cerebellar involvement such as on series 7, image 9. Associated faint cytotoxic edema of the larger lesions (such as left motor or pre motor strip series 11, image 20. No acute hemorrhage identified, but there is a superimposed chronic microhemorrhage at the junction of the posterior left temporal and occipital lobe white matter on SWI series 20, image 22. No other chronic cerebral blood products identified, although widely scattered small nonspecific white matter T2 and FLAIR hyperintense foci. No chronic cortical encephalomalacia identified. No midline shift, mass effect, evidence of mass lesion, ventriculomegaly, extra-axial collection or acute intracranial hemorrhage. Cervicomedullary junction and pituitary are within normal limits. Vascular: Major intracranial vascular flow voids are preserved. MRA is reported separately. Skull and upper cervical spine: Negative for age visible cervical spine. Visualized bone marrow signal is within normal limits. Sinuses/Orbits: Negative. Other: Grossly normal visible internal auditory structures. Negative visible scalp and face. IMPRESSION: 1. Positive for multiple small and widely scattered scattered acute or subacute infarcts in both cerebral hemispheres. No associated hemorrhage or mass effect. The pattern is suspicious for recent embolic event. 2. Underlying chronic white matter signal changes which are nonspecific but due to small vessel disease, including a solitary chronic microhemorrhage in the left hemisphere. 3. Intracranial MRA today is reported separately. Electronically Signed   By: Odessa Fleming M.D.   On: 04/07/2023 12:59     Scheduled Meds:   aspirin EC  325 mg Oral Daily   bisacodyl  10 mg Oral Once   diltiazem  360 mg Oral Daily   feeding supplement  1 Container Oral TID BM   insulin aspart  0-9 Units Subcutaneous Q6H   levothyroxine  25 mcg Oral Q0600   loratadine  10 mg Oral Daily   metoprolol tartrate  50 mg Oral BID   pantoprazole (PROTONIX) IV  40 mg Intravenous QHS   polyethylene glycol-electrolytes  4,000 mL Oral Once     LOS: 2 days    Time spent: 55 minutes    Kamiah Fite Hoover Brunette, DO [facility]  If 7PM-7AM, please contact night-coverage [email or web address] 04/09/2023, 10:37 AM

## 2023-04-09 NOTE — Progress Notes (Signed)
 Patient had complained of headaches to me during the shift and stated that the tylenol only helped but for so long. I ordered the patient Claritin because she also stated she felt like there was pressure in her head but also MD order one time dose Fioricet. Patient stated that after taking this medication her headache finally went completely away and she was able to get some sleep.

## 2023-04-09 NOTE — Progress Notes (Addendum)
 Nurse at bedside this am,patient  alert and oriented times four . Patient c/o pain having a headache this am rated a 3,dull pain off and on.Blood pressure 109/50,heart rate 63,Dr Sherryll Burger notified,holding the metoprolol this am.Plan of care on going.

## 2023-04-09 NOTE — Progress Notes (Signed)
 Gastroenterology Progress Note   Referring Provider: No ref. provider found Primary Care Physician:  Elfredia Nevins, MD Primary Gastroenterologist:  Hennie Duos. Marletta Lor, DO Patient ID: Becky [name]; [id number]; 1960-11-18   Subjective:    Headache better. Overall feeling better. Last episode of blood in stool last night. Fresh appearing. Seems to be less than before. At times feels pressure and just passes blood without stool. No melena.   Objective:   Vital signs in last 24 hours: Temp:  [97.8 F (36.6 C)-98.6 F (37 C)] 98.4 F (36.9 C) (04/03 0404) Pulse Rate:  [42-110] 63 (04/03 0404) Resp:  [16-20] 20 (04/03 0404) BP: (121-155)/(54-88) 121/54 (04/03 0404) SpO2:  [96 %-100 %] 100 % (04/03 0404) Last BM Date : 04/08/23 General:   Alert,  Well-developed, well-nourished, pleasant and cooperative in NAD Head:  Normocephalic and atraumatic. Eyes:  Sclera clear, no icterus.   Abdomen:  Soft, nontender and nondistended.   Extremities:  Without clubbing, deformity or edema. Neurologic:  Alert and  oriented x4;  grossly normal neurologically. Skin:  Intact without significant lesions or rashes. Psych:  Alert and cooperative. Normal mood and affect.  Intake/Output from previous day: 04/02 0701 - 04/03 0700 In: 240 [P.O.:240] Out: -  Intake/Output this shift: No intake/output data recorded.  Lab Results: CBC Recent Labs    04/07/23 1108 04/07/23 1915 04/08/23 0455 04/09/23 0451  WBC 5.9  --  5.7 6.5  HGB 10.9* 11.5* 10.5* 9.9*  HCT 33.5* 34.8* 31.5* 30.8*  MCV 94.6  --  93.2 93.9  PLT 296  --  280 263   BMET Recent Labs    04/07/23 1108 04/08/23 0455 04/09/23 0451  NA 140 139 137  K 3.8 3.7 3.7  CL 106 106 104  CO2 25 23 24   GLUCOSE 96 98 109*  BUN 11 9 8   CREATININE 1.08* 1.04* 1.14*  CALCIUM 9.8 9.3 9.2   LFTs Recent Labs    04/07/23 1108  BILITOT 0.8  ALKPHOS 58  AST 17  ALT 15  PROT 6.9  ALBUMIN 4.0   No results for input(s): "LIPASE"  in the last 72 hours. PT/INR Recent Labs    04/07/23 1108  LABPROT 21.4*  INR 1.8*         Imaging Studies: CT ANGIO HEAD NECK W WO CM Result Date: 04/07/2023 CLINICAL DATA:  Transient ischemic attack, dizziness, right arm feels "asleep" for the past few weeks. Headache this morning. Scattered acute and subacute infarcts in the bilateral cerebral hemispheres on same day MRI brain. EXAM: CT ANGIOGRAPHY HEAD AND NECK WITH AND WITHOUT CONTRAST TECHNIQUE: Multidetector CT imaging of the head and neck was performed using the standard protocol during bolus administration of intravenous contrast. Multiplanar CT image reconstructions and MIPs were obtained to evaluate the vascular anatomy. Carotid stenosis measurements (when applicable) are obtained utilizing NASCET criteria, using the distal internal carotid diameter as the denominator. RADIATION DOSE REDUCTION: This exam was performed according to the departmental dose-optimization program which includes automated exposure control, adjustment of the mA and/or kV according to patient size and/or use of iterative reconstruction technique. CONTRAST:  75mL OMNIPAQUE IOHEXOL 350 MG/ML SOLN COMPARISON:  Same day MRI brain. FINDINGS: CT HEAD FINDINGS Brain: No acute intracranial hemorrhage. Infarcts on same day MRI are not well visualized on the current study. Nonspecific hypoattenuation in the periventricular and subcortical white matter favored to reflect chronic microvascular ischemic changes. No edema, mass effect, or midline shift. The basilar cisterns are patent. Ventricles: Ventricles are  normal in size and configuration. Vascular: No hyperdense vessel. Intracranial atherosclerotic calcifications noted. Skull: No acute or aggressive finding. Sinuses/orbits: The visualized paranasal sinuses are clear. Orbits are symmetric. Other: Mastoid air cells are clear. CTA NECK FINDINGS Aortic arch: Standard configuration of the aortic arch. Imaged portion shows no  evidence of aneurysm or dissection. No significant stenosis of the major arch vessel origins. Pulmonary arteries: As permitted by contrast timing, there are no filling defects in the visualized pulmonary arteries. Subclavian arteries: The subclavian arteries are patent bilaterally. Right carotid system: Patent. Mild atherosclerosis at the carotid bifurcation without hemodynamically significant stenosis. No evidence of dissection. Left carotid system: No evidence of dissection, stenosis (50% or greater), or occlusion. Vertebral arteries: The bilateral vertebral arteries are patent at their origins. There is slightly limited evaluation of the distal V1 and proximal V2 segments of the right vertebral artery due to artifact and adjacent dense venous contrast. Visualized portions of the vertebral arteries are patent to the vertebrobasilar confluence. No evidence of high-grade stenosis or dissection. Skeleton: No acute or aggressive finding noted. Other neck: The visualized airway is patent. No cervical lymphadenopathy. Upper chest: Rounded masslike consolidative opacity involving the right upper lobe measuring 3.0 x 2.7 cm with adjacent patchy ground-glass and consolidative opacities. Review of the MIP images confirms the above findings CTA HEAD FINDINGS ANTERIOR CIRCULATION: The intracranial ICAs are patent bilaterally. Atherosclerosis of the bilateral carotid siphons resulting in mild stenosis. No high-grade stenosis, proximal occlusion, aneurysm, or vascular malformation. MCAs: The middle cerebral arteries are patent bilaterally. ACAs: The anterior cerebral arteries are patent bilaterally. POSTERIOR CIRCULATION: No significant stenosis, proximal occlusion, aneurysm, or vascular malformation. PCAs: Patent bilaterally. Moderate narrowing of the posterior P2 segment of the right PCA. Pcomm: Visualized bilaterally more pronounced on the right. SCAs: The superior cerebellar arteries are patent bilaterally. Basilar artery:  Patent AICAs: Patent PICAs: Not well visualized. Vertebral arteries: The intracranial vertebral arteries are patent. Venous sinuses: As permitted by contrast timing, patent. Anatomic variants: None Review of the MIP images confirms the above findings IMPRESSION: No large vessel occlusion. Moderate stenosis of the posterior P2 segment right PCA. Otherwise no significant stenosis of the arteries in the head and neck. No acute intracranial hemorrhage. Infarcts on same day MRI brain are not well visualized on the current study. 3.0 cm rounded masslike consolidative opacity in the right upper lobe with surrounding patchy ground-glass and consolidative opacities. Findings correspond to radiographic abnormality from 02/11/2023. Reiterate recommendation for dedicated CT chest. Electronically Signed   By: Emily Filbert M.D.   On: 04/07/2023 19:17   MR ANGIO HEAD WO CONTRAST Result Date: 04/07/2023 CLINICAL DATA:  63 year old female TIA. EXAM: MRA HEAD WITHOUT CONTRAST TECHNIQUE: Angiographic images of the Circle of Willis were acquired using MRA technique without intravenous contrast. COMPARISON:  Brain MRI today reported separately FINDINGS: Anterior circulation: Antegrade flow in the distal cervical ICAs and both ICA siphons with evidence of calcified plaque and irregularity, but no hemodynamically significant siphon stenosis. Patent carotid termini. Normal ophthalmic and right posterior communicating artery origins. Patent MCA and ACA origins. Visible bilateral MCA and ACA branches are within normal limits. Posterior circulation: Antegrade flow in the distal vertebral arteries and basilar which appear irregular but without significant stenosis. AICA, SCA, left PCA origins are patent. Fetal type right PCA origin is patent. Bilateral PCA branches are within normal limits. Anatomic variants: Fetal type right PCA origin. Other: Brain MRI today is reported separately. IMPRESSION: 1. Head MRA is positive for evidence of  intracranial atherosclerosis, but negative for large vessel occlusion or hemodynamically significant stenosis. 2. MRI today is reported separately. Electronically Signed   By: Odessa Fleming M.D.   On: 04/07/2023 13:02   MR BRAIN WO CONTRAST Result Date: 04/07/2023 CLINICAL DATA:  63 year old female TIA. EXAM: MRI HEAD WITHOUT CONTRAST TECHNIQUE: Multiplanar, multiecho pulse sequences of the brain and surrounding structures were obtained without intravenous contrast. COMPARISON:  Intracranial MRA today reported separately. FINDINGS: Brain: Normal cerebral volume for age. Cavum septum pellucidum, normal variant. Scattered small foci of restricted diffusion in both cerebral hemispheres including the bilateral MCA and PCA territories. Deep gray nuclei, posterior fossa appear relatively spared although questionable subtle cerebellar involvement such as on series 7, image 9. Associated faint cytotoxic edema of the larger lesions (such as left motor or pre motor strip series 11, image 20. No acute hemorrhage identified, but there is a superimposed chronic microhemorrhage at the junction of the posterior left temporal and occipital lobe white matter on SWI series 20, image 22. No other chronic cerebral blood products identified, although widely scattered small nonspecific white matter T2 and FLAIR hyperintense foci. No chronic cortical encephalomalacia identified. No midline shift, mass effect, evidence of mass lesion, ventriculomegaly, extra-axial collection or acute intracranial hemorrhage. Cervicomedullary junction and pituitary are within normal limits. Vascular: Major intracranial vascular flow voids are preserved. MRA is reported separately. Skull and upper cervical spine: Negative for age visible cervical spine. Visualized bone marrow signal is within normal limits. Sinuses/Orbits: Negative. Other: Grossly normal visible internal auditory structures. Negative visible scalp and face. IMPRESSION: 1. Positive for multiple  small and widely scattered scattered acute or subacute infarcts in both cerebral hemispheres. No associated hemorrhage or mass effect. The pattern is suspicious for recent embolic event. 2. Underlying chronic white matter signal changes which are nonspecific but due to small vessel disease, including a solitary chronic microhemorrhage in the left hemisphere. 3. Intracranial MRA today is reported separately. Electronically Signed   By: Odessa Fleming M.D.   On: 04/07/2023 12:59  [2 weeks]  Assessment:   Pleasant 63 year old female with history of GERD, dysphagia, chronic constipation, atrial fibrillation on Xarelto, hypertension, hyperlipidemia, hypothyroidism, history of prior rectal bleeding due to hemorrhoids with banding in 2023 who presented to the ED after being seen by our team in outpatient GI clinic with persistent rectal bleeding for few weeks as well as some neurological symptoms including double vision, right arm numbness and tingling in the right hand.  She reported last Xarelto dose of March 21 due to rectal bleeding.  Hemoglobin in the ED was 10.9.  Rectal bleeding/anemia: Persistent rectal bleeding over the past several weeks notably in the setting of straining secondary to constipation.  Symptoms associated with rectal pressure, rectal itching.  Last dose of Xarelto March 21 as she held medication due to rectal bleeding.  History of hemorrhoids status post banding in 2023.  Last colonoscopy 2022 with diverticulosis, 3 polyps in the sigmoid colon removed, normal-appearing terminal ileum.  Hemoglobin has remained fairly stable, around 1g drop since admission.  Given her last colonoscopy was 3 years ago, requirements for anticoagulation in the setting of AFib/emboic stroke,recommend updating colonoscopy.  Patient has been evaluated by neurology who is also requesting urgent colonoscopy as well as restarting Xarelto soon as possible and if not able to do so, then starting aspirin/Plavix. Cannot exclude  need for Watchman if cannot safely resume anticoagulation. GI team has requested that patient be started on aspirin 81 mg daily now pending colonoscopy.  Plan:   Plan for colonoscopy tomorrow.  I have discussed the risks, alternatives, benefits with regards to but not limited to the risk of reaction to medication, bleeding, infection, perforation and the patient is agreeable to proceed. Written consent to be obtained.    LOS: 2 days   Leanna Battles. Dixon Boos [facility] [phone] 4/3/20257:56 AM

## 2023-04-09 NOTE — Plan of Care (Signed)
  Problem: Education: Goal: Knowledge of General Education information will improve Description: Including pain rating scale, medication(s)/side effects and non-pharmacologic comfort measures Outcome: Progressing   Problem: Health Behavior/Discharge Planning: Goal: Ability to manage health-related needs will improve Outcome: Progressing   Problem: Clinical Measurements: Goal: Ability to maintain clinical measurements within normal limits will improve Outcome: Progressing Goal: Will remain free from infection Outcome: Progressing Goal: Diagnostic test results will improve Outcome: Progressing Goal: Respiratory complications will improve Outcome: Progressing Goal: Cardiovascular complication will be avoided Outcome: Progressing   Problem: Activity: Goal: Risk for activity intolerance will decrease Outcome: Progressing   Problem: Nutrition: Goal: Adequate nutrition will be maintained Outcome: Progressing   Problem: Coping: Goal: Level of anxiety will decrease Outcome: Progressing   Problem: Elimination: Goal: Will not experience complications related to bowel motility Outcome: Progressing Goal: Will not experience complications related to urinary retention Outcome: Progressing   Problem: Pain Managment: Goal: General experience of comfort will improve and/or be controlled Outcome: Progressing   Problem: Safety: Goal: Ability to remain free from injury will improve Outcome: Progressing   Problem: Skin Integrity: Goal: Risk for impaired skin integrity will decrease Outcome: Progressing   Problem: Education: Goal: Knowledge of disease or condition will improve Outcome: Progressing Goal: Knowledge of secondary prevention will improve (MUST DOCUMENT ALL) Outcome: Progressing Goal: Knowledge of patient specific risk factors will improve (DELETE if not current risk factor) Outcome: Progressing   Problem: Ischemic Stroke/TIA Tissue Perfusion: Goal: Complications of  ischemic stroke/TIA will be minimized Outcome: Progressing   Problem: Coping: Goal: Will verbalize positive feelings about self Outcome: Progressing Goal: Will identify appropriate support needs Outcome: Progressing   Problem: Health Behavior/Discharge Planning: Goal: Ability to manage health-related needs will improve Outcome: Progressing Goal: Goals will be collaboratively established with patient/family Outcome: Progressing   Problem: Self-Care: Goal: Ability to participate in self-care as condition permits will improve Outcome: Progressing Goal: Verbalization of feelings and concerns over difficulty with self-care will improve Outcome: Progressing Goal: Ability to communicate needs accurately will improve Outcome: Progressing   Problem: Nutrition: Goal: Risk of aspiration will decrease Outcome: Progressing Goal: Dietary intake will improve Outcome: Progressing   Problem: Education: Goal: Ability to describe self-care measures that may prevent or decrease complications (Diabetes Survival Skills Education) will improve Outcome: Progressing Goal: Individualized Educational Video(s) Outcome: Progressing   Problem: Coping: Goal: Ability to adjust to condition or change in health will improve Outcome: Progressing   Problem: Fluid Volume: Goal: Ability to maintain a balanced intake and output will improve Outcome: Progressing   Problem: Health Behavior/Discharge Planning: Goal: Ability to identify and utilize available resources and services will improve Outcome: Progressing Goal: Ability to manage health-related needs will improve Outcome: Progressing   Problem: Metabolic: Goal: Ability to maintain appropriate glucose levels will improve Outcome: Progressing   Problem: Nutritional: Goal: Maintenance of adequate nutrition will improve Outcome: Progressing Goal: Progress toward achieving an optimal weight will improve Outcome: Progressing   Problem: Skin  Integrity: Goal: Risk for impaired skin integrity will decrease Outcome: Progressing   Problem: Tissue Perfusion: Goal: Adequacy of tissue perfusion will improve Outcome: Progressing

## 2023-04-09 NOTE — Progress Notes (Signed)
   04/09/23 1313  Provider Notification  Provider Name/Title Dr Sherryll Burger  Date Provider Notified 04/09/23  Time Provider Notified 1313  Method of Notification Page  Notification Reason Critical Result (Hemoglobin 4.6)  Test performed and critical result LAB  Date Critical Result Received 04/09/23  Time Critical Result Received 1313  Provider response See new orders  Date of Provider Response 04/09/23

## 2023-04-09 NOTE — Progress Notes (Signed)
*  PRELIMINARY RESULTS* Echocardiogram 2D Echocardiogram has been performed.  Stacey Drain 04/09/2023, 10:36 AM

## 2023-04-10 ENCOUNTER — Telehealth: Payer: Self-pay | Admitting: Gastroenterology

## 2023-04-10 ENCOUNTER — Encounter (HOSPITAL_COMMUNITY): Payer: Self-pay | Admitting: Internal Medicine

## 2023-04-10 ENCOUNTER — Encounter (HOSPITAL_COMMUNITY)
Admission: EM | Disposition: A | Discharge status: Home / Self Care | Payer: Self-pay | Source: Home / Self Care | Attending: Internal Medicine

## 2023-04-10 ENCOUNTER — Inpatient Hospital Stay (HOSPITAL_COMMUNITY): Admitting: Certified Registered Nurse Anesthetist

## 2023-04-10 ENCOUNTER — Telehealth (INDEPENDENT_AMBULATORY_CARE_PROVIDER_SITE_OTHER): Payer: Self-pay | Admitting: Gastroenterology

## 2023-04-10 ENCOUNTER — Other Ambulatory Visit: Payer: Self-pay

## 2023-04-10 DIAGNOSIS — K648 Other hemorrhoids: Secondary | ICD-10-CM

## 2023-04-10 DIAGNOSIS — K5730 Diverticulosis of large intestine without perforation or abscess without bleeding: Secondary | ICD-10-CM

## 2023-04-10 DIAGNOSIS — I639 Cerebral infarction, unspecified: Secondary | ICD-10-CM | POA: Diagnosis not present

## 2023-04-10 HISTORY — PX: COLONOSCOPY: SHX5424

## 2023-04-10 LAB — GLUCOSE, CAPILLARY
Glucose-Capillary: 100 mg/dL — ABNORMAL HIGH (ref 70–99)
Glucose-Capillary: 109 mg/dL — ABNORMAL HIGH (ref 70–99)
Glucose-Capillary: 125 mg/dL — ABNORMAL HIGH (ref 70–99)
Glucose-Capillary: 75 mg/dL (ref 70–99)
Glucose-Capillary: 86 mg/dL (ref 70–99)

## 2023-04-10 LAB — BASIC METABOLIC PANEL WITH GFR
Anion gap: 7 (ref 5–15)
BUN: 9 mg/dL (ref 8–23)
CO2: 25 mmol/L (ref 22–32)
Calcium: 9.6 mg/dL (ref 8.9–10.3)
Chloride: 108 mmol/L (ref 98–111)
Creatinine, Ser: 1.18 mg/dL — ABNORMAL HIGH (ref 0.44–1.00)
GFR, Estimated: 53 mL/min — ABNORMAL LOW (ref >60)
Glucose, Bld: 107 mg/dL — ABNORMAL HIGH (ref 70–99)
Potassium: 4.1 mmol/L (ref 3.5–5.1)
Sodium: 140 mmol/L (ref 135–145)

## 2023-04-10 LAB — CBC
HCT: 32.6 % — ABNORMAL LOW (ref 36.0–46.0)
Hemoglobin: 10.7 g/dL — ABNORMAL LOW (ref 12.0–15.0)
MCH: 30.7 pg (ref 26.0–34.0)
MCHC: 32.8 g/dL (ref 30.0–36.0)
MCV: 93.4 fL (ref 80.0–100.0)
Platelets: 292 10*3/uL (ref 150–400)
RBC: 3.49 MIL/uL — ABNORMAL LOW (ref 3.87–5.11)
RDW: 13.1 % (ref 11.5–15.5)
WBC: 6.6 10*3/uL (ref 4.0–10.5)
nRBC: 0 % (ref 0.0–0.2)

## 2023-04-10 LAB — MAGNESIUM: Magnesium: 1.9 mg/dL (ref 1.7–2.4)

## 2023-04-10 SURGERY — COLONOSCOPY
Anesthesia: General

## 2023-04-10 MED ORDER — SENNOSIDES-DOCUSATE SODIUM 8.6-50 MG PO TABS: 1.0000 | ORAL_TABLET | Freq: Every evening | ORAL | 0 refills | Status: AC | PRN

## 2023-04-10 MED ORDER — DEXTROSE IN LACTATED RINGERS 5 % IV SOLN: INTRAVENOUS | Status: AC

## 2023-04-10 MED ORDER — ASPIRIN 81 MG PO CHEW
81.0000 mg | CHEWABLE_TABLET | Freq: Once | ORAL | Status: AC
  Administered 2023-04-10: 81 mg via ORAL
  Filled 2023-04-10: qty 1

## 2023-04-10 MED ORDER — HYDROCORTISONE ACETATE 25 MG RE SUPP: 25.0000 mg | Freq: Two times a day (BID) | RECTAL | 1 refills | Status: DC | PRN

## 2023-04-10 MED ORDER — POLYETHYLENE GLYCOL 3350 17 G PO PACK: 17.0000 g | PACK | Freq: Every day | ORAL | 1 refills | Status: DC

## 2023-04-10 MED ORDER — SODIUM CHLORIDE 0.9 % IV SOLN: INTRAVENOUS | Status: DC

## 2023-04-10 MED ORDER — PHENYLEPHRINE 80 MCG/ML (10ML) SYRINGE FOR IV PUSH (FOR BLOOD PRESSURE SUPPORT)
PREFILLED_SYRINGE | INTRAVENOUS | Status: DC | PRN
  Administered 2023-04-10: 160 ug via INTRAVENOUS

## 2023-04-10 MED ORDER — PROPOFOL 10 MG/ML IV BOLUS
INTRAVENOUS | Status: DC | PRN
  Administered 2023-04-10: 120 ug/kg/min via INTRAVENOUS
  Administered 2023-04-10: 50 mg via INTRAVENOUS

## 2023-04-10 MED ORDER — LIDOCAINE HCL (CARDIAC) PF 100 MG/5ML IV SOSY
PREFILLED_SYRINGE | INTRAVENOUS | Status: DC | PRN
  Administered 2023-04-10: 60 mg via INTRAVENOUS

## 2023-04-10 MED ORDER — LACTATED RINGERS IV SOLN: INTRAVENOUS | Status: DC | PRN

## 2023-04-10 NOTE — Progress Notes (Signed)
We will proceed with colonoscopy as scheduled.  I thoroughly discussed with the patient the procedure, including the risks involved. Patient understands what the procedure involves including the benefits and any risks. Patient understands alternatives to the proposed procedure. Risks including (but not limited to) bleeding, tearing of the lining (perforation), rupture of adjacent organs, problems with heart and lung function, infection, and medication reactions. A small percentage of complications may require surgery, hospitalization, repeat endoscopic procedure, and/or transfusion.  Patient understood and agreed.  Maylon Peppers, MD Gastroenterology and Hepatology [facility]

## 2023-04-10 NOTE — Discharge Summary (Signed)
 Physician Discharge Summary  Becky [name] UJW:[id number] DOB: 01/21/1960 DOA: 04/07/2023  PCP: Elfredia Nevins, MD  Admit date: 04/07/2023  Discharge date: 04/10/2023  Admitted From:Home  Disposition:  Home  Recommendations for Outpatient Follow-up:  Follow up with PCP in 1-2 weeks Resume home medications as prior to include Xarelto for anticoagulation Remain on stool softeners and MiraLAX daily to maintain soft bowel movements Anusol suppositories as needed twice daily for recurrent hemorrhoidal bleeding. Repeat colonoscopy recommended in 5 years  Home Health: None  Equipment/Devices: None  Discharge Condition:Stable  CODE STATUS: Full  Diet recommendation: Heart Healthy/carb modified  Brief/Interim Summary: Becky [name] is a 62 y.o. female with medical history significant for diabetes, hypertension, atrial fibrillation, coronary artery disease, hypothyroidism. Patient presented to the ED with complaints of numbness to her right hand, and left toes, she also reports intermittent episodes of dizziness, blurred vision, headache.  Numbness started about 2 days ago.  Reports intermittent dizziness over the past month.  Dizziness and blurry vision are not dependent on position.  No facial asymmetry, no change in vision, she reports some weakness of her extremities but is not specific. She reports over the past few weeks, she has been having bloody stools.  She reports sometimes she does not see the stool just a lot of blood.  She denies black stools, no vomiting or vomiting of blood. Her Xarelto was held, she last took it- 3/21.  Neurology had evaluated patient and she was noted to have some small embolic CVAs and was cleared for colonoscopy which took place 4/4.  She was noted to have nonbleeding internal hemorrhoids as well as some diverticulosis and was recommended to have repeat colonoscopy in 5 years and to remain on MiraLAX daily.  She is okay to resume anticoagulation per GI.   No further follow-up needed at this point.  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  Discharge Instructions  Discharge Instructions     Ambulatory referral to Physical Therapy   Complete by: As directed    Diet - low sodium heart healthy   Complete by: As directed    Increase activity slowly   Complete by: As directed       Allergies as of 04/10/2023       Reactions   Apresoline [hydralazine] Nausea Only   Latex    Other    Band aids discolor skin ekg pads irritate skin    Prednisone Swelling   Patient states that she can take methylprednisolone without complications        Medication List     STOP taking these medications    diltiazem 360 MG 24 hr capsule Commonly known as: CARDIZEM CD       TAKE these medications    acetaminophen 500 MG tablet Commonly known as: TYLENOL Take 1,000 mg by mouth every 6 (six) hours as needed for mild pain (pain score 1-3).   albuterol 108 (90 Base) MCG/ACT inhaler Commonly known as: VENTOLIN HFA Inhale 2 puffs into the lungs every 4 (four) hours as needed for wheezing or shortness of breath.   dexlansoprazole 60 MG capsule Commonly known as: DEXILANT Take 1 capsule (60 mg  total) by mouth daily.   diltiazem 360 MG 24 hr capsule Commonly known as: TIAZAC Take 360 mg by mouth daily.   EPINEPHrine 0.3 mg/0.3 mL Soaj injection Commonly known as: EPI-PEN Inject 0.3 mg into the muscle once as needed for anaphylaxis.   glimepiride 2 MG tablet Commonly known as: AMARYL Take 2 mg by mouth daily with breakfast.   hydrocortisone 25 MG suppository Commonly known  as: ANUSOL-HC Place 1 suppository (25 mg total) rectally 2 (two) times daily as needed for hemorrhoids or anal itching.   Klor-Con M20 20 MEQ tablet Generic drug: potassium chloride SA Take 20 mEq by mouth daily.   linaclotide 72 MCG capsule Commonly known as: Linzess Take 1 capsule (72 mcg total) by mouth daily before breakfast.   lubiprostone 24 MCG capsule Commonly known as: AMITIZA TAKE 1 CAPSULE (24 MCG TOTAL) BY MOUTH 2 (TWO) TIMES DAILY WITH A MEAL.   magnesium oxide 400 (240 Mg) MG tablet Commonly known as: MAG-OX Take 400 mg by mouth daily.   metoprolol tartrate 50 MG tablet Commonly known as: LOPRESSOR TAKE 1 TABLET BY MOUTH TWICE A DAY   montelukast 10 MG tablet Commonly known as: SINGULAIR Take 10 mg by mouth at bedtime.   NON FORMULARY Pt uses a cpap nightly   olmesartan 40 MG tablet Commonly known as: Benicar Take 1 tablet (40 mg total) by mouth daily.   pantoprazole 40 MG tablet Commonly known as: PROTONIX TAKE 1 TABLET (40 MG TOTAL) BY MOUTH DAILY TAKE 30 MINUTES BEFORE BREAKFAST What changed:  when to take this reasons to take this   polyethylene glycol 17 g packet Commonly known as: MiraLax Take 17 g by mouth daily.   rivaroxaban 20 MG Tabs tablet Commonly known as: Xarelto Take 1 tablet (20 mg total) by mouth daily.   rosuvastatin 10 MG tablet Commonly known as: CRESTOR TAKE 1 TABLET BY MOUTH EVERY DAY   senna-docusate 8.6-50 MG tablet Commonly known as: Senokot-S Take 1 tablet by mouth at bedtime as needed for mild constipation or moderate constipation.   spironolactone 25 MG tablet Commonly known as: ALDACTONE TAKE 1 TABLET BY MOUTH EVERY DAY   Synthroid 25 MCG tablet Generic drug: levothyroxine Take 25 mcg by mouth daily.   tiZANidine 4 MG tablet Commonly known as: ZANAFLEX Take 4 mg by mouth every 6 (six) hours as needed for muscle spasms.   VITAMIN D-3 PO Take 1 tablet by mouth 3 (three) times a week.        Follow-up  Information     Elfredia Nevins, MD. Schedule an appointment as soon as possible for a visit in 1 week(s).   Specialty: Internal Medicine Contact information: [address] [place] [address] [phone]                Allergies  Allergen Reactions   Apresoline [Hydralazine] Nausea Only   Latex    Other     Band aids discolor skin ekg pads irritate skin    Prednisone Swelling    Patient states that she can take methylprednisolone without complications    Consultations: Neurology GI   Procedures/Studies: ECHOCARDIOGRAM COMPLETE Result Date: 04/09/2023    ECHOCARDIOGRAM REPORT   Patient Name:   Becky [name] Date of Exam: 04/09/2023 Medical Rec #:  [id number]         Height:       67.0 in Accession #:    [id number]        Weight:  254.9 lb Date of Birth:  Mar 03, 1960         BSA:          2.242 m Patient Age:    62 years          BP:           109/50 mmHg Patient Gender: F                 HR:           66 bpm. [facility] Procedure: 2D Echo, Cardiac Doppler and Color Doppler (Both Spectral and Color            Flow Doppler were utilized during procedure). Indications:    Stroke l63.9  History:        Patient has prior history of Echocardiogram examinations, most                 recent 04/01/2021. Arrythmias:Atrial Fibrillation; Risk                 Factors:Sleep Apnea, Dyslipidemia, Diabetes and Hypertension.  Sonographer:    Celesta Gentile RCS Referring Phys: [phone] Heloise Beecham EMOKPAE IMPRESSIONS  1. Left ventricular ejection fraction, by estimation, is 60 to 65%. The left ventricle has normal function. The left ventricle has no regional wall motion abnormalities. Left ventricular diastolic parameters are consistent with Grade II diastolic dysfunction (pseudonormalization). Elevated left atrial pressure.  2. Right ventricular systolic function is normal. The right ventricular size is normal. Tricuspid regurgitation signal is inadequate for assessing PA  pressure.  3. Left atrial size was mildly dilated.  4. The mitral valve is normal in structure. Trivial mitral valve regurgitation. No evidence of mitral stenosis.  5. The aortic valve is tricuspid. Aortic valve regurgitation is mild to moderate. No aortic stenosis is present.  6. The inferior vena cava is dilated in size with >50% respiratory variability, suggesting right atrial pressure of 8 mmHg. FINDINGS  Left Ventricle: Left ventricular ejection fraction, by estimation, is 60 to 65%. The left ventricle has normal function. The left ventricle has no regional wall motion abnormalities. The left ventricular internal cavity size was normal in size. There is  no left ventricular hypertrophy. Left ventricular diastolic parameters are consistent with Grade II diastolic dysfunction (pseudonormalization). Elevated left atrial pressure. Right Ventricle: The right ventricular size is normal. Right vetricular wall thickness was not well visualized. Right ventricular systolic function is normal. Tricuspid regurgitation signal is inadequate for assessing PA pressure. Left Atrium: Left atrial size was mildly dilated. Right Atrium: Right atrial size was normal in size. Pericardium: There is no evidence of pericardial effusion. Mitral Valve: The mitral valve is normal in structure. Trivial mitral valve regurgitation. No evidence of mitral valve stenosis. Tricuspid Valve: The tricuspid valve is normal in structure. Tricuspid valve regurgitation is trivial. No evidence of tricuspid stenosis. Aortic Valve: The aortic valve is tricuspid. Aortic valve regurgitation is mild to moderate. Aortic regurgitation PHT measures 591 msec. No aortic stenosis is present. Aortic valve mean gradient measures 8.0 mmHg. Aortic valve peak gradient measures 16.9  mmHg. Aortic valve area, by VTI measures 2.24 cm. Pulmonic Valve: The pulmonic valve was not well visualized. Pulmonic valve regurgitation is not visualized. No evidence of pulmonic  stenosis. Aorta: The aortic root is normal in size and structure. Venous: The inferior vena cava is dilated in size with greater than 50% respiratory variability, suggesting right atrial pressure of 8 mmHg. IAS/Shunts: No atrial level shunt detected by  color flow Doppler.  LEFT VENTRICLE PLAX 2D LVIDd:         5.40 cm   Diastology LVIDs:         3.10 cm   LV e' medial:    6.64 cm/s LV PW:         1.00 cm   LV E/e' medial:  17.2 LV IVS:        1.00 cm   LV e' lateral:   6.96 cm/s LVOT diam:     1.90 cm   LV E/e' lateral: 16.4 LV SV:         107 LV SV Index:   48 LVOT Area:     2.84 cm  RIGHT VENTRICLE RV S prime:     11.70 cm/s TAPSE (M-mode): 2.2 cm LEFT ATRIUM             Index        RIGHT ATRIUM           Index LA diam:        4.60 cm 2.05 cm/m   RA Area:     14.70 cm LA Vol (A2C):   66.1 ml 29.48 ml/m  RA Volume:   33.90 ml  15.12 ml/m LA Vol (A4C):   91.9 ml 40.99 ml/m LA Biplane Vol: 81.2 ml 36.22 ml/m  AORTIC VALVE AV Area (Vmax):    2.50 cm AV Area (Vmean):   2.46 cm AV Area (VTI):     2.24 cm AV Vmax:           205.50 cm/s AV Vmean:          128.000 cm/s AV VTI:            0.478 m AV Peak Grad:      16.9 mmHg AV Mean Grad:      8.0 mmHg LVOT Vmax:         181.00 cm/s LVOT Vmean:        111.000 cm/s LVOT VTI:          0.378 m LVOT/AV VTI ratio: 0.79 AI PHT:            591 msec  AORTA Ao Root diam: 3.50 cm MITRAL VALVE MV Area (PHT): 2.95 cm     SHUNTS MV Decel Time: 257 msec     Systemic VTI:  0.38 m MV E velocity: 114.00 cm/s  Systemic Diam: 1.90 cm MV A velocity: 49.70 cm/s MV E/A ratio:  2.29 Dina Rich MD Electronically signed by Dina Rich MD Signature Date/Time: 04/09/2023/10:33:29 AM    Final    CT ANGIO HEAD NECK W WO CM Result Date: 04/07/2023 CLINICAL DATA:  Transient ischemic attack, dizziness, right arm feels "asleep" for the past few weeks. Headache this morning. Scattered acute and subacute infarcts in the bilateral cerebral hemispheres on same day MRI brain. EXAM: CT  ANGIOGRAPHY HEAD AND NECK WITH AND WITHOUT CONTRAST TECHNIQUE: Multidetector CT imaging of the head and neck was performed using the standard protocol during bolus administration of intravenous contrast. Multiplanar CT image reconstructions and MIPs were obtained to evaluate the vascular anatomy. Carotid stenosis measurements (when applicable) are obtained utilizing NASCET criteria, using the distal internal carotid diameter as the denominator. RADIATION DOSE REDUCTION: This exam was performed according to the departmental dose-optimization program which includes automated exposure control, adjustment of the mA and/or kV according to patient size and/or use of iterative reconstruction technique. CONTRAST:  75mL OMNIPAQUE IOHEXOL 350 MG/ML SOLN COMPARISON:  Same  day MRI brain. FINDINGS: CT HEAD FINDINGS Brain: No acute intracranial hemorrhage. Infarcts on same day MRI are not well visualized on the current study. Nonspecific hypoattenuation in the periventricular and subcortical white matter favored to reflect chronic microvascular ischemic changes. No edema, mass effect, or midline shift. The basilar cisterns are patent. Ventricles: Ventricles are normal in size and configuration. Vascular: No hyperdense vessel. Intracranial atherosclerotic calcifications noted. Skull: No acute or aggressive finding. Sinuses/orbits: The visualized paranasal sinuses are clear. Orbits are symmetric. Other: Mastoid air cells are clear. CTA NECK FINDINGS Aortic arch: Standard configuration of the aortic arch. Imaged portion shows no evidence of aneurysm or dissection. No significant stenosis of the major arch vessel origins. Pulmonary arteries: As permitted by contrast timing, there are no filling defects in the visualized pulmonary arteries. Subclavian arteries: The subclavian arteries are patent bilaterally. Right carotid system: Patent. Mild atherosclerosis at the carotid bifurcation without hemodynamically significant stenosis. No  evidence of dissection. Left carotid system: No evidence of dissection, stenosis (50% or greater), or occlusion. Vertebral arteries: The bilateral vertebral arteries are patent at their origins. There is slightly limited evaluation of the distal V1 and proximal V2 segments of the right vertebral artery due to artifact and adjacent dense venous contrast. Visualized portions of the vertebral arteries are patent to the vertebrobasilar confluence. No evidence of high-grade stenosis or dissection. Skeleton: No acute or aggressive finding noted. Other neck: The visualized airway is patent. No cervical lymphadenopathy. Upper chest: Rounded masslike consolidative opacity involving the right upper lobe measuring 3.0 x 2.7 cm with adjacent patchy ground-glass and consolidative opacities. Review of the MIP images confirms the above findings CTA HEAD FINDINGS ANTERIOR CIRCULATION: The intracranial ICAs are patent bilaterally. Atherosclerosis of the bilateral carotid siphons resulting in mild stenosis. No high-grade stenosis, proximal occlusion, aneurysm, or vascular malformation. MCAs: The middle cerebral arteries are patent bilaterally. ACAs: The anterior cerebral arteries are patent bilaterally. POSTERIOR CIRCULATION: No significant stenosis, proximal occlusion, aneurysm, or vascular malformation. PCAs: Patent bilaterally. Moderate narrowing of the posterior P2 segment of the right PCA. Pcomm: Visualized bilaterally more pronounced on the right. SCAs: The superior cerebellar arteries are patent bilaterally. Basilar artery: Patent AICAs: Patent PICAs: Not well visualized. Vertebral arteries: The intracranial vertebral arteries are patent. Venous sinuses: As permitted by contrast timing, patent. Anatomic variants: None Review of the MIP images confirms the above findings IMPRESSION: No large vessel occlusion. Moderate stenosis of the posterior P2 segment right PCA. Otherwise no significant stenosis of the arteries in the head  and neck. No acute intracranial hemorrhage. Infarcts on same day MRI brain are not well visualized on the current study. 3.0 cm rounded masslike consolidative opacity in the right upper lobe with surrounding patchy ground-glass and consolidative opacities. Findings correspond to radiographic abnormality from 02/11/2023. Reiterate recommendation for dedicated CT chest. Electronically Signed   By: Emily Filbert M.D.   On: 04/07/2023 19:17   MR ANGIO HEAD WO CONTRAST Result Date: 04/07/2023 CLINICAL DATA:  63 year old female TIA. EXAM: MRA HEAD WITHOUT CONTRAST TECHNIQUE: Angiographic images of the Circle of Willis were acquired using MRA technique without intravenous contrast. COMPARISON:  Brain MRI today reported separately FINDINGS: Anterior circulation: Antegrade flow in the distal cervical ICAs and both ICA siphons with evidence of calcified plaque and irregularity, but no hemodynamically significant siphon stenosis. Patent carotid termini. Normal ophthalmic and right posterior communicating artery origins. Patent MCA and ACA origins. Visible bilateral MCA and ACA branches are within normal limits. Posterior circulation: Antegrade flow in the distal  vertebral arteries and basilar which appear irregular but without significant stenosis. AICA, SCA, left PCA origins are patent. Fetal type right PCA origin is patent. Bilateral PCA branches are within normal limits. Anatomic variants: Fetal type right PCA origin. Other: Brain MRI today is reported separately. IMPRESSION: 1. Head MRA is positive for evidence of intracranial atherosclerosis, but negative for large vessel occlusion or hemodynamically significant stenosis. 2. MRI today is reported separately. Electronically Signed   By: Odessa Fleming M.D.   On: 04/07/2023 13:02   MR BRAIN WO CONTRAST Result Date: 04/07/2023 CLINICAL DATA:  63 year old female TIA. EXAM: MRI HEAD WITHOUT CONTRAST TECHNIQUE: Multiplanar, multiecho pulse sequences of the brain and surrounding  structures were obtained without intravenous contrast. COMPARISON:  Intracranial MRA today reported separately. FINDINGS: Brain: Normal cerebral volume for age. Cavum septum pellucidum, normal variant. Scattered small foci of restricted diffusion in both cerebral hemispheres including the bilateral MCA and PCA territories. Deep gray nuclei, posterior fossa appear relatively spared although questionable subtle cerebellar involvement such as on series 7, image 9. Associated faint cytotoxic edema of the larger lesions (such as left motor or pre motor strip series 11, image 20. No acute hemorrhage identified, but there is a superimposed chronic microhemorrhage at the junction of the posterior left temporal and occipital lobe white matter on SWI series 20, image 22. No other chronic cerebral blood products identified, although widely scattered small nonspecific white matter T2 and FLAIR hyperintense foci. No chronic cortical encephalomalacia identified. No midline shift, mass effect, evidence of mass lesion, ventriculomegaly, extra-axial collection or acute intracranial hemorrhage. Cervicomedullary junction and pituitary are within normal limits. Vascular: Major intracranial vascular flow voids are preserved. MRA is reported separately. Skull and upper cervical spine: Negative for age visible cervical spine. Visualized bone marrow signal is within normal limits. Sinuses/Orbits: Negative. Other: Grossly normal visible internal auditory structures. Negative visible scalp and face. IMPRESSION: 1. Positive for multiple small and widely scattered scattered acute or subacute infarcts in both cerebral hemispheres. No associated hemorrhage or mass effect. The pattern is suspicious for recent embolic event. 2. Underlying chronic white matter signal changes which are nonspecific but due to small vessel disease, including a solitary chronic microhemorrhage in the left hemisphere. 3. Intracranial MRA today is reported separately.  Electronically Signed   By: Odessa Fleming M.D.   On: 04/07/2023 12:59     Discharge Exam: Vitals:   04/10/23 1527 04/10/23 1542  BP: (!) 123/53 (!) 128/55  Pulse: (!) 49 (!) 51  Resp: 17 18  Temp: 98 F (36.7 C) 98 F (36.7 C)  SpO2: 100% 100%   Vitals:   04/10/23 1515 04/10/23 1518 04/10/23 1527 04/10/23 1542  BP: (!) 126/59  (!) 123/53 (!) 128/55  Pulse: (!) 49 (!) 51 (!) 49 (!) 51  Resp: 20 (!) 21 17 18   Temp: 97.8 F (36.6 C)  98 F (36.7 C) 98 F (36.7 C)  TempSrc:    Oral  SpO2: 97% 100% 100% 100%  Weight:      Height:        General: Pt is alert, awake, not in acute distress Cardiovascular: RRR, S1/S2 +, no rubs, no gallops Respiratory: CTA bilaterally, no wheezing, no rhonchi Abdominal: Soft, NT, ND, bowel sounds + Extremities: no edema, no cyanosis    The results of significant diagnostics from this hospitalization (including imaging, microbiology, ancillary and laboratory) are listed below for reference.     Microbiology: No results found for this or any previous visit (from the  past 240 hours).   Labs: BNP (last 3 results) Recent Labs    02/12/23 1017  BNP 80.0   Basic Metabolic Panel: Recent Labs  Lab 04/05/23 2041 04/07/23 1108 04/08/23 0455 04/09/23 0451 04/10/23 0450  NA 139 140 139 137 140  K 4.1 3.8 3.7 3.7 4.1  CL 107 106 106 104 108  CO2 23 25 23 24 25   GLUCOSE 74 96 98 109* 107*  BUN 17 11 9 8 9   CREATININE 1.46* 1.08* 1.04* 1.14* 1.18*  CALCIUM 9.5 9.8 9.3 9.2 9.6  MG  --   --  1.8 1.8 1.9   Liver Function Tests: Recent Labs  Lab 04/05/23 2041 04/07/23 1108  AST 17 17  ALT 16 15  ALKPHOS 64 58  BILITOT 0.8 0.8  PROT 6.9 6.9  ALBUMIN 4.1 4.0   No results for input(s): "LIPASE", "AMYLASE" in the last 168 hours. No results for input(s): "AMMONIA" in the last 168 hours. CBC: Recent Labs  Lab 04/05/23 2041 04/07/23 1108 04/07/23 1915 04/08/23 0455 04/09/23 0451 04/10/23 0450  WBC 7.2 5.9  --  5.7 6.5 6.6  NEUTROABS  3.3 3.1  --   --   --   --   HGB 11.1* 10.9* 11.5* 10.5* 9.9* 10.7*  HCT 33.9* 33.5* 34.8* 31.5* 30.8* 32.6*  MCV 94.7 94.6  --  93.2 93.9 93.4  PLT 283 296  --  280 263 292   Cardiac Enzymes: No results for input(s): "CKTOTAL", "CKMB", "CKMBINDEX", "TROPONINI" in the last 168 hours. BNP: Invalid input(s): "POCBNP" CBG: Recent Labs  Lab 04/10/23 0007 04/10/23 0522 04/10/23 1157 04/10/23 1438 04/10/23 1518  GLUCAP 100* 109* 125* 75 86   D-Dimer No results for input(s): "DDIMER" in the last 72 hours. Hgb A1c No results for input(s): "HGBA1C" in the last 72 hours. Lipid Profile Recent Labs    04/08/23 0455  CHOL 160  HDL 47  LDLCALC 93  TRIG 98  CHOLHDL 3.4   Thyroid function studies No results for input(s): "TSH", "T4TOTAL", "T3FREE", "THYROIDAB" in the last 72 hours.  Invalid input(s): "FREET3" Anemia work up No results for input(s): "VITAMINB12", "FOLATE", "FERRITIN", "TIBC", "IRON", "RETICCTPCT" in the last 72 hours. Urinalysis    Component Value Date/Time   COLORURINE STRAW (A) 04/07/2023 1518   APPEARANCEUR CLEAR 04/07/2023 1518   APPEARANCEUR Clear 01/01/2017 1400   LABSPEC 1.005 04/07/2023 1518   PHURINE 6.0 04/07/2023 1518   GLUCOSEU NEGATIVE 04/07/2023 1518   HGBUR MODERATE (A) 04/07/2023 1518   BILIRUBINUR NEGATIVE 04/07/2023 1518   BILIRUBINUR Negative 01/01/2017 1400   KETONESUR NEGATIVE 04/07/2023 1518   PROTEINUR NEGATIVE 04/07/2023 1518   UROBILINOGEN 0.2 01/16/2018 1308   NITRITE NEGATIVE 04/07/2023 1518   LEUKOCYTESUR NEGATIVE 04/07/2023 1518   Sepsis Labs Recent Labs  Lab 04/07/23 1108 04/08/23 0455 04/09/23 0451 04/10/23 0450  WBC 5.9 5.7 6.5 6.6   Microbiology No results found for this or any previous visit (from the past 240 hours).   Time coordinating discharge: 35 minutes  SIGNED:   Erick Blinks, DO [facility] 04/10/2023, 4:16 PM  If 7PM-7AM, please contact night-coverage [email or web address]

## 2023-04-10 NOTE — Transfer of Care (Signed)
 Immediate Anesthesia Transfer of Care Note  [name]  Procedure(s) Performed: COLONOSCOPY  Patient Location: PACU  Anesthesia Type:General  Level of Consciousness: awake, alert , and oriented  Airway & Oxygen Therapy: Patient Spontanous Breathing  Post-op Assessment: Report given to RN and Post -op Vital signs reviewed and stable  Post vital signs: Reviewed and stable  Last Vitals:  Vitals Value Taken Time  BP 126/59 04/10/23 1516  Temp    Pulse 51 04/10/23 1518  Resp 21 04/10/23 1518  SpO2 100 % 04/10/23 1518  Vitals shown include unfiled device data.  Last Pain:  Vitals:   04/10/23 1449  TempSrc:   PainSc: 0-No pain      Patients Stated Pain Goal: 1 (04/09/23 0815)  Complications: No notable events documented.

## 2023-04-10 NOTE — Plan of Care (Signed)
  Problem: Education: Goal: Knowledge of General Education information will improve Description: Including pain rating scale, medication(s)/side effects and non-pharmacologic comfort measures Outcome: Progressing   Problem: Health Behavior/Discharge Planning: Goal: Ability to manage health-related needs will improve Outcome: Progressing   Problem: Clinical Measurements: Goal: Ability to maintain clinical measurements within normal limits will improve Outcome: Progressing   Problem: Education: Goal: Knowledge of disease or condition will improve Outcome: Progressing Goal: Knowledge of secondary prevention will improve (MUST DOCUMENT ALL) Outcome: Progressing Goal: Knowledge of patient specific risk factors will improve (DELETE if not current risk factor) Outcome: Progressing   Problem: Ischemic Stroke/TIA Tissue Perfusion: Goal: Complications of ischemic stroke/TIA will be minimized Outcome: Progressing   Problem: Coping: Goal: Will verbalize positive feelings about self Outcome: Progressing Goal: Will identify appropriate support needs Outcome: Progressing   Problem: Health Behavior/Discharge Planning: Goal: Ability to manage health-related needs will improve Outcome: Progressing Goal: Goals will be collaboratively established with patient/family Outcome: Progressing   Problem: Self-Care: Goal: Ability to participate in self-care as condition permits will improve Outcome: Progressing Goal: Ability to communicate needs accurately will improve Outcome: Progressing   Problem: Nutrition: Goal: Risk of aspiration will decrease Outcome: Progressing Goal: Dietary intake will improve Outcome: Progressing

## 2023-04-10 NOTE — Op Note (Addendum)
 [facility] Patient Name: Becky [name] Procedure Date: 04/10/2023 2:28 PM MRN: [id number] Date of Birth: [birth year] Attending MD: Katrinka Blazing , , [id number] CSN: [id number] Age: 63 Admit Type: Outpatient Procedure:                Colonoscopy Indications:              Rectal bleeding Providers:                Katrinka Blazing, Sheran Fava, Lennice Sites                            Technician, Technician Referring MD:              Medicines:                Monitored Anesthesia Care Complications:            No immediate complications. Estimated Blood Loss:     Estimated blood loss: none. Procedure:                Pre-Anesthesia Assessment:                           - Prior to the procedure, a History and Physical                            was performed, and patient medications, allergies                            and sensitivities were reviewed. The patient's                            tolerance of previous anesthesia was reviewed.                           - The risks and benefits of the procedure and the                            sedation options and risks were discussed with the                            patient. All questions were answered and informed                            consent was obtained.                           - ASA Grade Assessment: III - A patient with severe                            systemic disease.                           After obtaining informed consent, the colonoscope                            was passed under direct vision. Throughout the  procedure, the patient's blood pressure, pulse, and                            oxygen saturations were monitored continuously. The                            [id number]-[id number] ([id number]) scope was introduced through                            the anus and advanced to the the terminal ileum.                            The colonoscopy was performed without difficulty.                             The patient tolerated the procedure well. The                            quality of the bowel preparation was good. Scope In: 2:55:15 PM Scope Out: 3:11:56 PM Scope Withdrawal Time: 0 hours 12 minutes 39 seconds  Total Procedure Duration: 0 hours 16 minutes 41 seconds  Findings:      The perianal and digital rectal examinations were normal.      The terminal ileum appeared normal.      Scattered small-mouthed diverticula were found in the sigmoid colon and       descending colon.      Non-bleeding internal hemorrhoids were found during retroflexion. The       hemorrhoids were medium-sized. Impression:               - The examined portion of the ileum was normal.                           - Diverticulosis in the sigmoid colon and in the                            descending colon.                           - Non-bleeding internal hemorrhoids.                           - No specimens collected. Moderate Sedation:      Per Anesthesia Care Recommendation:           - Return patient to hospital ward for ongoing care.                           - Resume previous diet.                           - Repeat colonoscopy in 5 years for screening                            purposes.                           -  Start taking one capful of Miralax every day,                            uptitrate up to 3 capfuls per day as needed to keep                            stools soft.                           - Can use Anusol suppositories BID if presenting                            recurrent hemorrhoidal bleeding.                           - OK to restart anticoagulation. Procedure Code(s):        --- Professional ---                           [phone], Colonoscopy, flexible; diagnostic, including                            collection of specimen(s) by brushing or washing,                            when performed (separate procedure) Diagnosis Code(s):        --- Professional ---                            K64.8, Other hemorrhoids                           K62.5, Hemorrhage of anus and rectum                           K57.30, Diverticulosis of large intestine without                            perforation or abscess without bleeding CPT copyright 2022 American Medical Association. All rights reserved. The codes documented in this report are preliminary and upon coder review may  be revised to meet current compliance requirements. Katrinka Blazing, MD Katrinka Blazing,  04/10/2023 3:18:30 PM This report has been signed electronically. Number of Addenda: 0

## 2023-04-10 NOTE — Progress Notes (Signed)
 Patient discharged home. All questions and concerns answered. Patient has all personal belongings, including discharge paperwork. Patient left floor via wheelchair to POV.

## 2023-04-10 NOTE — Anesthesia Preprocedure Evaluation (Addendum)
 Anesthesia Evaluation  Patient identified by MRN, date of birth, ID band Patient awake    Reviewed: Allergy & Precautions, H&P , NPO status , Patient's Chart, lab work & pertinent test results, reviewed documented beta blocker date and time   Airway Mallampati: II  TM Distance: >3 FB Neck ROM: full    Dental  (+) Dental Advisory Given, Missing, Poor Dentition,  Many missing teeth upper and lower:   Pulmonary shortness of breath, asthma , sleep apnea , former smoker   Pulmonary exam normal breath sounds clear to auscultation       Cardiovascular Exercise Tolerance: Good hypertension, + CAD  Normal cardiovascular exam+ dysrhythmias Atrial Fibrillation  Rhythm:irregular Rate:Normal     Neuro/Psych  Headaches  negative psych ROS   GI/Hepatic Neg liver ROS, PUD,GERD  Medicated,,  Endo/Other  diabetes, Well Controlled, Type 2Hypothyroidism  Class 3 obesity  Renal/GU negative Renal ROS  negative genitourinary   Musculoskeletal   Abdominal   Peds  Hematology  (+) Blood dyscrasia, anemia   Anesthesia Other Findings ? Prox RCA to Mid RCA lesion is 25% stenosed. ? Prox LAD to Mid LAD lesion is 25% stenosed. ? The left ventricular systolic function is normal. ? The left ventricular ejection fraction is 55-65% by visual estimate. ? LV end diastolic pressure is normal. LVEDP 15 mm Hg. ? There is no aortic valve stenosis.   Continue medical therapy.     Reproductive/Obstetrics negative OB ROS                             Anesthesia Physical Anesthesia Plan  ASA: 3  Anesthesia Plan: General   Post-op Pain Management: Minimal or no pain anticipated   Induction:   PONV Risk Score and Plan: Propofol infusion  Airway Management Planned: Natural Airway and Nasal Cannula  Additional Equipment: None  Intra-op Plan:   Post-operative Plan:   Informed Consent: I have reviewed the patients History  and Physical, chart, labs and discussed the procedure including the risks, benefits and alternatives for the proposed anesthesia with the patient or authorized representative who has indicated his/her understanding and acceptance.     Dental Advisory Given  Plan Discussed with: CRNA  Anesthesia Plan Comments:         Anesthesia Quick Evaluation

## 2023-04-10 NOTE — Brief Op Note (Signed)
 04/10/2023  3:14 PM  PATIENT:  Becky [name]  63 y.o. female  PRE-OPERATIVE DIAGNOSIS:  rectal bleeding  POST-OPERATIVE DIAGNOSIS:  diverticulosis, hemorrhoids  PROCEDURE:  Procedure(s): COLONOSCOPY (N/A)  SURGEON:  Surgeons and Role:    * Dolores Frame, MD - Primary  Patient underwent colonoscopy under propofol sedation.  Tolerated the procedure adequately.   FINDINGS: - The examined portion of the ileum was normal.  - Diverticulosis in the sigmoid colon and in the descending colon.  - Non-bleeding internal hemorrhoids.   RECOMMENDATIONS - Return patient to hospital ward for ongoing care.  - Resume previous diet.  - Repeat colonoscopy in 5 years for screening purposes.  - Start taking one capful of Miralax every day, uptitrate up to 3 capfuls per day as needed to keep stools soft. - Can use Anusol suppositories BID if presenting recurrent hemorrhoidal bleeding. - OK to restart anticoagulation.  Katrinka Blazing, MD Gastroenterology and Hepatology [facility] Gastroenterology

## 2023-04-10 NOTE — Anesthesia Postprocedure Evaluation (Signed)
 Anesthesia Post Note  [name]  Procedure(s) Performed: COLONOSCOPY  Patient location during evaluation: PACU Anesthesia Type: General Level of consciousness: awake and alert Pain management: pain level controlled Vital Signs Assessment: post-procedure vital signs reviewed and stable Respiratory status: spontaneous breathing, nonlabored ventilation, respiratory function stable and patient connected to nasal cannula oxygen Cardiovascular status: blood pressure returned to baseline and stable Postop Assessment: no apparent nausea or vomiting Anesthetic complications: no   No notable events documented.   Last Vitals:  Vitals:   04/10/23 1527 04/10/23 1542  BP: (!) 123/53 (!) 128/55  Pulse: (!) 49 (!) 51  Resp: 17 18  Temp: 36.7 C 36.7 C  SpO2: 100% 100%    Last Pain:  Vitals:   04/10/23 1542  TempSrc: Oral  PainSc:                  Gaetano Hawthorne

## 2023-04-10 NOTE — Telephone Encounter (Signed)
-----   Message from Nurse Tommy Rainwater sent at 04/07/2023  1:37 PM EDT ----- Patient was in the ED during their scheduled appointment with me today. Please reach out to reschedule. Thank you. Baxter Hire

## 2023-04-10 NOTE — Telephone Encounter (Signed)
 Patient has been following with Tobi Bastos, recently hospitalized for rectal bleeding - undergoing colonoscopy today.  She has appointment scheduled with Tobi Bastos 4/8, please reschedule this with Tobi Bastos for 2-3 weeks from now for hospital follow up.   Brooke Bonito, MSN, APRN, FNP-BC, AGACNP-BC [facility] Gastroenterology at [facility]

## 2023-04-10 NOTE — Telephone Encounter (Signed)
 Hi Amanda,  Can you please schedule a follow up appointment for this patient in 3-4 weeks with Dr. Marletta Lor or Anna/Kristen.  Thanks,  Katrinka Blazing, MD Gastroenterology and Hepatology [facility] Gastroenterology

## 2023-04-13 ENCOUNTER — Encounter (HOSPITAL_COMMUNITY): Payer: Self-pay | Admitting: Gastroenterology

## 2023-04-13 ENCOUNTER — Other Ambulatory Visit: Payer: Self-pay

## 2023-04-13 ENCOUNTER — Emergency Department (HOSPITAL_COMMUNITY)
Admission: EM | Admit: 2023-04-13 | Discharge: 2023-04-14 | Disposition: A | Discharge status: Home / Self Care | Attending: Emergency Medicine | Admitting: Emergency Medicine

## 2023-04-13 DIAGNOSIS — E119 Type 2 diabetes mellitus without complications: Secondary | ICD-10-CM | POA: Insufficient documentation

## 2023-04-13 DIAGNOSIS — Z8673 Personal history of transient ischemic attack (TIA), and cerebral infarction without residual deficits: Secondary | ICD-10-CM

## 2023-04-13 DIAGNOSIS — R42 Dizziness and giddiness: Secondary | ICD-10-CM | POA: Insufficient documentation

## 2023-04-13 DIAGNOSIS — R519 Headache, unspecified: Secondary | ICD-10-CM | POA: Diagnosis not present

## 2023-04-13 DIAGNOSIS — I6523 Occlusion and stenosis of bilateral carotid arteries: Secondary | ICD-10-CM | POA: Diagnosis not present

## 2023-04-13 DIAGNOSIS — R29818 Other symptoms and signs involving the nervous system: Secondary | ICD-10-CM | POA: Diagnosis not present

## 2023-04-13 DIAGNOSIS — Z91040 Latex allergy status: Secondary | ICD-10-CM | POA: Diagnosis not present

## 2023-04-13 DIAGNOSIS — I10 Essential (primary) hypertension: Secondary | ICD-10-CM | POA: Insufficient documentation

## 2023-04-13 NOTE — Telephone Encounter (Signed)
 Ok, thanks, that would work

## 2023-04-13 NOTE — ED Triage Notes (Addendum)
 Pt to ED from home with c/o dizziness, blurred vision that has subsided now, pt was discharged on 03/10/23 for CVA from this hospital, pt had same sx, pt says the symptoms come and go, reports feeling better for one day, then dizziness/blurred vision came back yesterday. Pt ambulatory to triage. No other neuro sx reported or observed at this time.

## 2023-04-14 ENCOUNTER — Emergency Department (HOSPITAL_COMMUNITY)

## 2023-04-14 ENCOUNTER — Telehealth: Payer: Self-pay

## 2023-04-14 ENCOUNTER — Ambulatory Visit: Admitting: Gastroenterology

## 2023-04-14 DIAGNOSIS — R29818 Other symptoms and signs involving the nervous system: Secondary | ICD-10-CM | POA: Diagnosis not present

## 2023-04-14 DIAGNOSIS — I6523 Occlusion and stenosis of bilateral carotid arteries: Secondary | ICD-10-CM | POA: Diagnosis not present

## 2023-04-14 DIAGNOSIS — R42 Dizziness and giddiness: Secondary | ICD-10-CM | POA: Diagnosis not present

## 2023-04-14 DIAGNOSIS — R519 Headache, unspecified: Secondary | ICD-10-CM | POA: Diagnosis not present

## 2023-04-14 LAB — CBC WITH DIFFERENTIAL/PLATELET
Abs Immature Granulocytes: 0.02 10*3/uL (ref 0.00–0.07)
Basophils Absolute: 0 10*3/uL (ref 0.0–0.1)
Basophils Relative: 0 %
Eosinophils Absolute: 0.3 10*3/uL (ref 0.0–0.5)
Eosinophils Relative: 4 %
HCT: 35.3 % — ABNORMAL LOW (ref 36.0–46.0)
Hemoglobin: 11.4 g/dL — ABNORMAL LOW (ref 12.0–15.0)
Immature Granulocytes: 0 %
Lymphocytes Relative: 38 %
Lymphs Abs: 2.7 10*3/uL (ref 0.7–4.0)
MCH: 30.4 pg (ref 26.0–34.0)
MCHC: 32.3 g/dL (ref 30.0–36.0)
MCV: 94.1 fL (ref 80.0–100.0)
Monocytes Absolute: 0.8 10*3/uL (ref 0.1–1.0)
Monocytes Relative: 12 %
Neutro Abs: 3.3 10*3/uL (ref 1.7–7.7)
Neutrophils Relative %: 46 %
Platelets: 318 10*3/uL (ref 150–400)
RBC: 3.75 MIL/uL — ABNORMAL LOW (ref 3.87–5.11)
RDW: 13 % (ref 11.5–15.5)
WBC: 7.1 10*3/uL (ref 4.0–10.5)
nRBC: 0 % (ref 0.0–0.2)

## 2023-04-14 LAB — BASIC METABOLIC PANEL WITH GFR
Anion gap: 8 (ref 5–15)
BUN: 18 mg/dL (ref 8–23)
CO2: 23 mmol/L (ref 22–32)
Calcium: 9.4 mg/dL (ref 8.9–10.3)
Chloride: 110 mmol/L (ref 98–111)
Creatinine, Ser: 1.11 mg/dL — ABNORMAL HIGH (ref 0.44–1.00)
GFR, Estimated: 57 mL/min — ABNORMAL LOW (ref >60)
Glucose, Bld: 104 mg/dL — ABNORMAL HIGH (ref 70–99)
Potassium: 3.7 mmol/L (ref 3.5–5.1)
Sodium: 141 mmol/L (ref 135–145)

## 2023-04-14 NOTE — Discharge Instructions (Signed)
 Continue medications as previously prescribed.  Follow-up with primary doctor later this week, and return to the ER if your symptoms significantly worsen or change.

## 2023-04-14 NOTE — Progress Notes (Signed)
 Complex Care Management Note  Care Guide Note 04/14/2023 Name: Epifania Littrell MRN: [id number] DOB: [birth year]  Chenille Toor is a 63 y.o. year old female who sees Elfredia Nevins, MD for primary care. I reached out to [name] by phone today to offer complex care management services.  Ms. Rathje was given information about Complex Care Management services today including:   The Complex Care Management services include support from the care team which includes your Nurse Care Manager, Clinical Social Worker, or Pharmacist.  The Complex Care Management team is here to help remove barriers to the health concerns and goals most important to you. Complex Care Management services are voluntary, and the patient may decline or stop services at any time by request to their care team member.   Complex Care Management Consent Status: Patient agreed to services and verbal consent obtained.   Follow up plan:  Telephone appointment with complex care management team member scheduled for:  04/15/23 at 11:00 a.m.   Encounter Outcome:  Patient Scheduled  [facility]  [facility], [facility] Health Care Management Assistant Direct Dial: [phone]  Fax: [phone]

## 2023-04-14 NOTE — ED Provider Notes (Signed)
 [place] EMERGENCY DEPARTMENT AT [facility] Provider Note   CSN: [id number] Arrival date & time: 04/13/23  2128     History  Chief Complaint  Patient presents with   Dizziness    Becky [name] is a 63 y.o. female.  Patient is a 63 year old female with past medical history of hypertension, type 2 diabetes, GI bleeding, and recent admission for stroke.  Patient presenting today with complaints of dizziness.  She has had episodes of lightheadedness and blurry vision since earlier today.  Patient discharged several days ago after being diagnosed with a stroke.  The symptoms feel similar.  She does report being compliant with her Xarelto.  It seems as though this blurry vision and dizziness is intermittent and at the time of my evaluation is symptom-free.       Home Medications Prior to Admission medications   Medication Sig Start Date End Date Taking? Authorizing Provider  acetaminophen (TYLENOL) 500 MG tablet Take 1,000 mg by mouth every 6 (six) hours as needed for mild pain (pain score 1-3).    [provider]  albuterol (VENTOLIN HFA) 108 (90 Base) MCG/ACT inhaler Inhale 2 puffs into the lungs every 4 (four) hours as needed for wheezing or shortness of breath. 12/07/21   Particia Nearing, PA-C  Cholecalciferol (VITAMIN D-3 PO) Take 1 tablet by mouth 3 (three) times a week.    [provider]  dexlansoprazole (DEXILANT) 60 MG capsule Take 1 capsule (60 mg total) by mouth daily. 04/07/23   Gelene Mink, NP  diltiazem (TIAZAC) 360 MG 24 hr capsule Take 360 mg by mouth daily. 02/24/23   [provider]  EPINEPHrine 0.3 mg/0.3 mL IJ SOAJ injection Inject 0.3 mg into the muscle once as needed for anaphylaxis. 04/20/18   [provider]  glimepiride (AMARYL) 2 MG tablet Take 2 mg by mouth daily with breakfast.    [provider]  hydrocortisone (ANUSOL-HC) 25 MG suppository Place 1 suppository (25 mg total) rectally 2 (two) times  daily as needed for hemorrhoids or anal itching. 04/10/23 04/09/24  Sherryll Burger, Pratik D, DO  KLOR-CON M20 20 MEQ tablet Take 20 mEq by mouth daily. 08/28/22   [provider]  linaclotide Karlene Einstein) 72 MCG capsule Take 1 capsule (72 mcg total) by mouth daily before breakfast. 04/07/23   Gelene Mink, NP  lubiprostone (AMITIZA) 24 MCG capsule TAKE 1 CAPSULE (24 MCG TOTAL) BY MOUTH 2 (TWO) TIMES DAILY WITH A MEAL. 06/12/21   Gelene Mink, NP  magnesium oxide (MAG-OX) 400 (240 Mg) MG tablet Take 400 mg by mouth daily.    [provider]  metoprolol tartrate (LOPRESSOR) 50 MG tablet TAKE 1 TABLET BY MOUTH TWICE A DAY 12/25/22   Antoine Poche, MD  montelukast (SINGULAIR) 10 MG tablet Take 10 mg by mouth at bedtime. 08/25/16   [provider]  NON FORMULARY Pt uses a cpap nightly    [provider]  olmesartan (BENICAR) 40 MG tablet Take 1 tablet (40 mg total) by mouth daily. 04/06/20   Antoine Poche, MD  pantoprazole (PROTONIX) 40 MG tablet TAKE 1 TABLET (40 MG TOTAL) BY MOUTH DAILY TAKE 30 MINUTES BEFORE BREAKFAST Patient taking differently: Take 40 mg by mouth daily as needed (heartburn). Take 30 minutes before breakfast 01/22/23   Gelene Mink, NP  polyethylene glycol (MIRALAX) 17 g packet Take 17 g by mouth daily. 04/10/23   Sherryll Burger, Pratik D, DO  rivaroxaban (XARELTO) 20 MG TABS  tablet Take 1 tablet (20 mg total) by mouth daily. Patient not taking: Reported on 04/07/2023 12/08/22   Antoine Poche, MD  rosuvastatin (CRESTOR) 10 MG tablet TAKE 1 TABLET BY MOUTH EVERY DAY 03/19/23   Antoine Poche, MD  senna-docusate (SENOKOT-S) 8.6-50 MG tablet Take 1 tablet by mouth at bedtime as needed for mild constipation or moderate constipation. 04/10/23 05/10/23  Sherryll Burger, Pratik D, DO  spironolactone (ALDACTONE) 25 MG tablet TAKE 1 TABLET BY MOUTH EVERY DAY 01/22/23   Antoine Poche, MD  SYNTHROID 25 MCG tablet Take 25 mcg by mouth daily. 09/25/22   [provider]  tiZANidine  (ZANAFLEX) 4 MG tablet Take 4 mg by mouth every 6 (six) hours as needed for muscle spasms. 12/25/22   [provider]      Allergies    Apresoline [hydralazine], Latex, Other, and Prednisone    Review of Systems   Review of Systems  All other systems reviewed and are negative.   Physical Exam Updated Vital Signs BP 125/61   Pulse 77   Temp 98.9 F (37.2 C) (Oral)   Resp 20   Ht 5\' 7"  (1.702 m)   Wt 115.6 kg   SpO2 97%   BMI 39.92 kg/m  Physical Exam Vitals and nursing note reviewed.  Constitutional:      General: She is not in acute distress.    Appearance: She is well-developed. She is not diaphoretic.  HENT:     Head: Normocephalic and atraumatic.  Eyes:     Extraocular Movements: Extraocular movements intact.     Pupils: Pupils are equal, round, and reactive to light.  Cardiovascular:     Rate and Rhythm: Normal rate and regular rhythm.     Heart sounds: No murmur heard.    No friction rub. No gallop.  Pulmonary:     Effort: Pulmonary effort is normal. No respiratory distress.     Breath sounds: Normal breath sounds. No wheezing.  Abdominal:     General: Bowel sounds are normal. There is no distension.     Palpations: Abdomen is soft.     Tenderness: There is no abdominal tenderness.  Musculoskeletal:        General: Normal range of motion.     Cervical back: Normal range of motion and neck supple.  Skin:    General: Skin is warm and dry.  Neurological:     General: No focal deficit present.     Mental Status: She is alert and oriented to person, place, and time.     Cranial Nerves: No cranial nerve deficit.     Motor: No weakness.     Coordination: Coordination normal.     ED Results / Procedures / Treatments   Labs (all labs ordered are listed, but only abnormal results are displayed) Labs Reviewed - No data to display  EKG None  Radiology No results found.  Procedures Procedures    Medications Ordered in ED Medications - No data  to display  ED Course/ Medical Decision Making/ A&P  Patient is a 63 year old female with history of hypertension type 2 diabetes, and recent admission for stroke.  She presents today with complaints of dizziness and blurry vision occurring intermittently for the past 2 days.  Patient arrives here with stable vital signs and is afebrile.  She is neurologically intact.  Laboratory studies obtained including CBC and basic metabolic panel, both of which are unremarkable.  CT scan of the head showing no acute process.  At this point, patient's workup is unremarkable and she is currently symptom-free.  I suspect her symptoms are the residual effects of her prior stroke and I doubt anything acute.  Her symptoms do seem to come and go, still doubt new stroke.  I feel as though patient can safely be discharged.  She is already on Xarelto and I will have her continue this.  Final Clinical Impression(s) / ED Diagnoses Final diagnoses:  None    Rx / DC Orders ED Discharge Orders     None         Geoffery Lyons, MD 04/14/23 [id number]

## 2023-04-15 ENCOUNTER — Other Ambulatory Visit: Payer: Self-pay | Admitting: *Deleted

## 2023-04-20 ENCOUNTER — Other Ambulatory Visit: Admitting: *Deleted

## 2023-04-20 ENCOUNTER — Telehealth: Payer: Self-pay | Admitting: *Deleted

## 2023-04-20 NOTE — Progress Notes (Signed)
 Complex Care Management Care Guide Note  04/20/2023 Name: Becky [name] MRN: [id number] DOB: 19-Jan-1960  Becky [name] is a 63 y.o. year old female who is a primary care patient of [name], MD and is actively engaged with the care management team. I reached out to [name] by phone today to assist with re-scheduling  with the RN Case Manager.  Follow up plan: Unsuccessful telephone outreach attempt made. A HIPAA compliant phone message was left for the patient providing contact information and requesting a return call.  Barnie Bora  [facility]  Value-Based Care Institute, [facility] Guide  Direct Dial: [phone]  Fax [phone]

## 2023-04-20 NOTE — Progress Notes (Signed)
 Complex Care Management Care Guide Note  04/20/2023 Name: [name] MRN: [id number] DOB: February 14, 1960  Becky [name] is a 63 y.o. year old female who is a primary care patient of [name], MD and is actively engaged with the care management team. [name] returned call by phone today to assist with re-scheduling  with the RN Case Manager.  Follow up plan: Telephone appointment with complex care management team member scheduled for:  4/21  Barnie Bora  [facility] Health  Value-Based Care Institute, [facility] Guide  Direct Dial: [phone]  Fax [phone]

## 2023-04-24 ENCOUNTER — Telehealth: Payer: Self-pay | Admitting: Cardiology

## 2023-04-24 DIAGNOSIS — I480 Paroxysmal atrial fibrillation: Secondary | ICD-10-CM

## 2023-04-24 MED ORDER — METOPROLOL TARTRATE 25 MG PO TABS: 12.5000 mg | ORAL_TABLET | Freq: Two times a day (BID) | ORAL | 1 refills | Status: DC

## 2023-04-24 MED ORDER — METOPROLOL TARTRATE 50 MG PO TABS: 25.0000 mg | ORAL_TABLET | Freq: Two times a day (BID) | ORAL | Status: DC

## 2023-04-24 NOTE — Telephone Encounter (Signed)
    Can further reduce Lopressor  to 12.5mg  BID (may ned new Rx if she only has 50mg  tablets from before). Please make sure she is taking Cardizem  CD 360mg  daily as this was listed as both being stopped and continued on her recent Hospital AVS. She has been on both given her history of atrial fibrillation but if she continues to have episodes of bradycardia, we may need to fully stop Lopressor .  Signed, Dorma Gash, PA-C 04/24/2023, 1:09 PM Pager: [phone]

## 2023-04-24 NOTE — Telephone Encounter (Signed)
 Pt c/o BP issue: STAT if pt c/o blurred vision, one-sided weakness or slurred speech.  STAT if BP is GREATER than 180/120 TODAY.  STAT if BP is LESS than 90/60 and SYMPTOMATIC TODAY  1. What is your BP concern? BP is dropping   2. Have you taken any BP medication today?no  3. What are your last 5 BP readings?148/81 66, 88/57 51, 131/75 82, 114/72 92, 111/66 54, 111/57 67, 119/76 75  4. Are you having any other symptoms (ex. Dizziness, headache, blurred vision, passed out)? Lightheaded, went back  to ER  on 4/7

## 2023-04-24 NOTE — Telephone Encounter (Signed)
 Patient informed and verbalized understanding of plan. Patient is taking all medications including cardizem .  New rx sent in for metoprolol 

## 2023-04-24 NOTE — Telephone Encounter (Signed)
 Patient states Lightheadedness off and on she states her HR drops and rises frequently  Patient states she self decreased metoprolol  to 25 mg BID and this has seemed to help her blood pressure and HR from dropping so much. Although patient is still concerned with her pressures and HR dropping. Please see provided readings below. Patient has not passed put. She is talking all medications except her decreasing her metoprolol .  She is worrie if she continues taking the metoprolol  that she will continue to have issues with low BP and HR

## 2023-04-27 ENCOUNTER — Telehealth: Payer: Self-pay | Admitting: Cardiology

## 2023-04-27 ENCOUNTER — Encounter: Payer: Self-pay | Admitting: *Deleted

## 2023-04-27 ENCOUNTER — Other Ambulatory Visit: Payer: Self-pay

## 2023-04-27 DIAGNOSIS — I480 Paroxysmal atrial fibrillation: Secondary | ICD-10-CM

## 2023-04-27 MED ORDER — RIVAROXABAN 20 MG PO TABS
20.0000 mg | ORAL_TABLET | Freq: Every day | ORAL | 1 refills | Status: DC
Start: 2023-04-27 — End: 2023-08-24

## 2023-04-27 NOTE — Patient Outreach (Signed)
 Complex Care Management   Visit Note  04/15/2023  Name:  Becky [name] MRN: [id number] DOB: 1960/12/16  Situation: Referral received for Complex Care Management related to Stroke and Atrial Fibrillation I obtained verbal consent from Patient.  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 diagnosed in 11/2016 --> started on Xarelto  for anticoagulation   Pelvic pain in female 11/02/2013   Plantar fasciitis of right foot    Pre-diabetes    Sleep apnea    dont use cpap says causes sinus infection   Thyroid  disease    Vaginal discharge 03/24/2014   Vaginal irritation 05/23/2013   Vaginal itching 03/24/2014   Vaginal Pap smear, abnormal     Assessment: Patient Reported Symptoms:  Cognitive Cognitive Status: Alert and oriented to person, place, and time, Normal speech and language skills Cognitive/Intellectual Conditions Management [RPT]: None reported or documented in medical history or problem list   Health Maintenance Behaviors: Annual physical exam Healing Pattern: Unsure Health Facilitated by: Rest  Neurological Neurological Review of Symptoms: Weakness, Dizziness (Right sided weakness and episodes of dizziness S/P CVA) Neurological Conditions: Stroke, ischemic Neurological Management Strategies: Medication therapy Neurological Self-Management Outcome: 4 (good)  HEENT HEENT  Symptoms Reported: No symptoms reported      Cardiovascular Cardiovascular Symptoms Reported: No symptoms reported Does patient have uncontrolled Hypertension?: No Cardiovascular Conditions: Dysrhythmia Cardiovascular Management Strategies: Medication therapy Cardiovascular Self-Management Outcome: 3 (uncertain)  Respiratory      Endocrine Patient reports the following symptoms related to hypoglycemia or hyperglycemia : No symptoms reported Is patient diabetic?: Yes Is patient checking blood sugars at home?: Yes Endocrine Conditions: Diabetes Endocrine Management Strategies: Diet modification, Medication therapy, Routine screening Endocrine Self-Management Outcome: 4 (good)  Gastrointestinal Gastrointestinal Symptoms Reported: Other Other Gastrointestinal Symptoms: Rectal bleeding. GI aware. Has upcoming appointment. Reports some trouble swallowing. Gastrointestinal Self-Management Outcome: 3 (uncertain) Nutrition Risk Screen (CP): Difficulty chewing/swallowing  Genitourinary Genitourinary Symptoms Reported: Not assessed    Integumentary Integumentary Symptoms Reported: Not assessed    Musculoskeletal Musculoskelatal Symptoms Reviewed: Weakness Additional Musculoskeletal Details: right sided weakness Musculoskeletal Self-Management Outcome: 3 (uncertain) Falls in the past year?: No Patient at Risk for Falls Due to: Impaired balance/gait Fall risk Follow up: Falls evaluation completed, Falls prevention discussed  Psychosocial Psychosocial Symptoms Reported: Not assessed   Major Change/Loss/Stressor/Fears (CP): Medical condition, self Techniques to Cope with Loss/Stress/Change: None        07/31/2022    3:35 PM  Depression screen PHQ 2/9  Decreased Interest 0  Down, Depressed, Hopeless 0  PHQ - 2 Score 0    There were no vitals filed for this visit.  Medications Reviewed Today   Medications were not reviewed in this encounter     Recommendation:   Specialty provider  follow-up cardiology and neurology  Follow Up Plan:   Telephone  follow up appointment date/time:  04/27/23 at 10:00   Michele Ahle, RN, BSN [place]  [facility] RN Care Manager Direct Dial: [phone]  Fax: [phone]

## 2023-04-27 NOTE — Telephone Encounter (Signed)
 Prescription refill request for Xarelto  received.  Indication: PAF Last office visit: 03/25/23  Letta Raw MD Weight: 116.3kg Age: 63 Scr: 1.11 on 04/14/23  Epic CrCl: 97.72  Based on above findings Xarelto  20mg  daily is the appropriate dose.  Refill approved.

## 2023-04-27 NOTE — Telephone Encounter (Signed)
*  STAT* If patient is at the pharmacy, call can be transferred to refill team.   1. Which medications need to be refilled? (please list name of each medication and dose if known)   rivaroxaban  (XARELTO ) 20 MG TABS tablet    2. Which pharmacy/location (including street and city if local pharmacy) is medication to be sent to? [facility] - [place], [place] - [address]   3. Do they need a 30 day or 90 day supply? 90  Patient is out of medication

## 2023-04-28 ENCOUNTER — Ambulatory Visit: Attending: Cardiology

## 2023-04-28 DIAGNOSIS — M79604 Pain in right leg: Secondary | ICD-10-CM | POA: Diagnosis not present

## 2023-04-28 DIAGNOSIS — M79605 Pain in left leg: Secondary | ICD-10-CM

## 2023-04-28 DIAGNOSIS — M79606 Pain in leg, unspecified: Secondary | ICD-10-CM

## 2023-04-28 LAB — VAS US ABI WITH/WO TBI
Left ABI: 0.55
Right ABI: 0.75
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
## 2023-04-29 NOTE — Patient Instructions (Incomplete)
 Please continue using your CPAP regularly. While your insurance requires that you use CPAP at least 4 hours each night on 70% of the nights, I recommend, that you not skip any nights and use it throughout the night if you can. Getting used to CPAP and staying with the treatment long term does take time and patience and discipline. Untreated obstructive sleep apnea when it is moderate to severe can have an adverse impact on cardiovascular health and raise her risk for heart disease, arrhythmias, hypertension, congestive heart failure, stroke and diabetes. Untreated obstructive sleep apnea causes sleep disruption, nonrestorative sleep, and sleep deprivation. This can have an impact on your day to day functioning and cause daytime sleepiness and impairment of cognitive function, memory loss, mood disturbance, and problems focussing. Using CPAP regularly can improve these symptoms.  We will update supply orders, today.   Follow up in 1 year

## 2023-04-29 NOTE — Progress Notes (Unsigned)
 Becky [name]

## 2023-04-30 ENCOUNTER — Telehealth: Payer: Self-pay | Admitting: Cardiology

## 2023-04-30 ENCOUNTER — Ambulatory Visit: Admitting: Family Medicine

## 2023-04-30 ENCOUNTER — Encounter: Payer: Self-pay | Admitting: Family Medicine

## 2023-04-30 VITALS — BP 120/70 | HR 65 | Ht 67.0 in | Wt 253.5 lb

## 2023-04-30 DIAGNOSIS — G4733 Obstructive sleep apnea (adult) (pediatric): Secondary | ICD-10-CM | POA: Diagnosis not present

## 2023-04-30 NOTE — Telephone Encounter (Signed)
 Pt c/o BP issue: STAT if pt c/o blurred vision, one-sided weakness or slurred speech.  STAT if BP is GREATER than 180/120 TODAY.  STAT if BP is LESS than 90/60 and SYMPTOMATIC TODAY  1. What is your BP concern?   Patient is concerned her BP has been trending low  2. Have you taken any BP medication today?  No  3. What are your last 5 BP readings?    4/21 PM   87/52  HR 72 4/22 - 92/68  HR 75      PM  117/65   HR 82      NIGHT 86/55   HR 47 4/23 - 118/80  HR 65      PM 117/67  HR 72      NIGHT  131/78  HR 78 4/24 - 121/78  HR 68       While on phone -  144/87  HR 77    4. Are you having any other symptoms (ex. Dizziness, headache, blurred vision, passed out)?   A little dizziness   Patient noted she had not taken any medication last night.

## 2023-04-30 NOTE — Telephone Encounter (Signed)
Agree with your assessment and plan  J Jailani Hogans MD 

## 2023-04-30 NOTE — Telephone Encounter (Signed)
 Reports holding olmesartan  40 mg, diltiazem  360 mg, and lopressor  12.5 mg last night and today due to lower BP readings. See below readings for details. Denies dizziness, chest pain or SOB. Reports staying well hydrated but not drinking as much water  as she should. Advised to break her olmesartan  in 1/2 daily to equal 20 mg. Advised to restart her other medications at normal dose and directions. Advised to continue monitoring her BP and symptoms. Advised to bring readings to her visit on 05/06/2023. Verbalized understanding.

## 2023-05-04 DIAGNOSIS — G4733 Obstructive sleep apnea (adult) (pediatric): Secondary | ICD-10-CM | POA: Diagnosis not present

## 2023-05-05 ENCOUNTER — Ambulatory Visit (HOSPITAL_COMMUNITY): Attending: Internal Medicine

## 2023-05-05 ENCOUNTER — Encounter: Payer: Self-pay | Admitting: Gastroenterology

## 2023-05-05 ENCOUNTER — Ambulatory Visit (INDEPENDENT_AMBULATORY_CARE_PROVIDER_SITE_OTHER): Admitting: Gastroenterology

## 2023-05-05 ENCOUNTER — Other Ambulatory Visit: Payer: Self-pay

## 2023-05-05 ENCOUNTER — Encounter (HOSPITAL_COMMUNITY): Payer: Self-pay

## 2023-05-05 VITALS — BP 125/78 | HR 62 | Temp 98.6°F | Ht 67.0 in | Wt 256.6 lb

## 2023-05-05 DIAGNOSIS — K5900 Constipation, unspecified: Secondary | ICD-10-CM

## 2023-05-05 DIAGNOSIS — R29898 Other symptoms and signs involving the musculoskeletal system: Secondary | ICD-10-CM | POA: Diagnosis present

## 2023-05-05 DIAGNOSIS — K219 Gastro-esophageal reflux disease without esophagitis: Secondary | ICD-10-CM

## 2023-05-05 DIAGNOSIS — Z860101 Personal history of adenomatous and serrated colon polyps: Secondary | ICD-10-CM | POA: Diagnosis not present

## 2023-05-05 DIAGNOSIS — K648 Other hemorrhoids: Secondary | ICD-10-CM | POA: Diagnosis not present

## 2023-05-05 DIAGNOSIS — Z7409 Other reduced mobility: Secondary | ICD-10-CM | POA: Diagnosis present

## 2023-05-05 MED ORDER — LINACLOTIDE 72 MCG PO CAPS: 72.0000 ug | ORAL_CAPSULE | Freq: Every day | ORAL | 3 refills | Status: DC

## 2023-05-05 MED ORDER — DEXLANSOPRAZOLE 60 MG PO CPDR: 60.0000 mg | DELAYED_RELEASE_CAPSULE | Freq: Every day | ORAL | 3 refills | Status: DC
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
## 2023-05-05 NOTE — Patient Instructions (Signed)
 I have given samples of Linzess  and sent Linzess  to the pharmacy. I have also sent in Dexilant .  Avoid straining, limit toilet time to 2-3 minutes, and avoid constipation.  We will hold off on any banding right now, but we can discuss the risks and benefits of banding in the future if you have significant rectal bleeding.  We will see you in 3 months!  I enjoyed seeing you again today! I value our relationship and want to provide genuine, compassionate, and quality care. You may receive a survey regarding your visit with me, and I welcome your feedback! Thanks so much for taking the time to complete this. I look forward to seeing you again.      Delman Ferns, PhD, ANP-BC [facility]

## 2023-05-06 ENCOUNTER — Encounter: Payer: Self-pay | Admitting: Nurse Practitioner

## 2023-05-06 ENCOUNTER — Ambulatory Visit: Attending: Nurse Practitioner | Admitting: Nurse Practitioner

## 2023-05-06 VITALS — BP 142/60 | HR 81 | Ht 67.0 in | Wt 257.4 lb

## 2023-05-06 DIAGNOSIS — M79605 Pain in left leg: Secondary | ICD-10-CM

## 2023-05-06 DIAGNOSIS — R42 Dizziness and giddiness: Secondary | ICD-10-CM | POA: Diagnosis present

## 2023-05-06 DIAGNOSIS — R6889 Other general symptoms and signs: Secondary | ICD-10-CM

## 2023-05-06 DIAGNOSIS — R001 Bradycardia, unspecified: Secondary | ICD-10-CM

## 2023-05-06 DIAGNOSIS — I739 Peripheral vascular disease, unspecified: Secondary | ICD-10-CM | POA: Diagnosis present

## 2023-05-06 DIAGNOSIS — I480 Paroxysmal atrial fibrillation: Secondary | ICD-10-CM | POA: Diagnosis present

## 2023-05-06 DIAGNOSIS — M79604 Pain in right leg: Secondary | ICD-10-CM | POA: Diagnosis present

## 2023-05-06 DIAGNOSIS — N1831 Chronic kidney disease, stage 3a: Secondary | ICD-10-CM | POA: Diagnosis present

## 2023-05-06 DIAGNOSIS — I10 Essential (primary) hypertension: Secondary | ICD-10-CM

## 2023-05-06 DIAGNOSIS — E785 Hyperlipidemia, unspecified: Secondary | ICD-10-CM | POA: Diagnosis present

## 2023-05-06 DIAGNOSIS — R600 Localized edema: Secondary | ICD-10-CM

## 2023-05-06 DIAGNOSIS — I38 Endocarditis, valve unspecified: Secondary | ICD-10-CM

## 2023-05-06 NOTE — Patient Instructions (Addendum)
 Medication Instructions:  Your physician has recommended you make the following change in your medication:  Please stop Lopressor    Labwork: None   Testing/Procedures: Your physician has requested that you have a lower or upper extremity arterial duplex. This test is an ultrasound of the arteries in the legs or arms. It looks at arterial blood flow in the legs and arms. Allow one hour for Lower and Upper Arterial scans. There are no restrictions or special instructions.  Please note: We ask at that you not bring children with you during ultrasound (echo/ vascular) testing. Due to room size and safety concerns, children are not allowed in the ultrasound rooms during exams. Our front office staff cannot provide observation of children in our lobby area while testing is being conducted. An adult accompanying a patient to their appointment will only be allowed in the ultrasound room at the discretion of the ultrasound technician under special circumstances. We apologize for any inconvenience.  Follow-Up: Your physician recommends that you schedule a follow-up appointment in: 4-6 weeks   Any Other Special Instructions Will Be Listed Below (If Applicable).  If you need a refill on your cardiac medications before your next appointment, please call your pharmacy.

## 2023-05-06 NOTE — Progress Notes (Signed)
 Cardiology Office Note:  .   Date: 05/06/2023 ID:  Becky [name], DOB [birth year], MRN [id number] PCP: [name], MD  [facility] Providers Cardiologist:  Armida Lander, MD Electrophysiologist:  Boyce Byes, MD    History of Present Illness: .   Becky [name] is a 63 y.o. female with a PMH of chest pain, PAF, HTN, HLD, shortness of breath/DOE, OSA, cough, dizziness, mild to moderate AI, CKD, and leg edema/leg pains, who presents today for 4 week follow-up.   Last seen by Dr. Armida Lander on March 25, 2022.  At that time, she noticed some increased symptoms of A-fib during some previous extended issues with respiratory tract infections, palpitations were resolving.  At that time, she noted some recurrent GI bleeding and was instructed to hold Xarelto  for 3 days and resume if her issues resolved.  At that time, she had a GI appointment the following week.  Recommended to that may need to reconsider Watchman, she has started evaluation.  ABIs were ordered due to her reported leg pains.  ED visit on April 05, 2023 for rectal bleeding. Fecal occult was positive.  H&H: 11.1/33.9.  Was discharged from ED with GI follow-up.  She was off her Xarelto  for 3 days as instructed.  Hospitalized early April 2025 due to complaints of numbness along right hand, numbness along left toes, also noted intermittent episodes of dizziness, headache, and blurred vision.  She also continues to note bloody stools.  No melena noted.  Xarelto  was held and was noted the last time she took medication was on March 27, 2023.  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  Noted to have nonbleeding internal hemorrhoids, some diverticulosis, recommended to have repeat colonoscopy in 5 years and remain on MiraLAX  daily.  Per GI, she was okay to resume OAC.  ED visit April 13, 2023  for dizziness.  Stated the symptoms felt similar before previous hospitalization when diagnosed with a stroke.  Reported being compliant with Xarelto .  Noted intermittent blurry vision and dizziness.  CT of the head was negative for anything acute.  Blood work was unremarkable.  Contacted our office on April 18 noting intermittent lightheadedness, noting labile heart rates, self decreased metoprolol  tartrate to 25 mg twice daily, seemed to help her blood pressure and heart rate from dropping so much.  Denied syncope. Woodfin Hays, PA-C recommended to further reduce Lopressor  to 12.5 mg BID.   05/06/2023 - Today she presents for scheduled follow-up.  She states she is doing better than previously.  Tells me she had 3 days of some bleeding noticed after her colonoscopy, then the bleeding stopped.  She denies any recent recurrent GI bleeding.  Admits to some lower SBP readings, confirms SBP ranging from 95- 118.  Notices heart rate dropping at nighttime, 40's at times even after lowering Lopressor  to 12.5 mg BID, HR on average is between 53-63 bpm.  She is compliant with her CPAP usage.  Admits to some lightheadedness and dizziness triggered by lights and stimuli from her phone.  Says she has been diagnosed with vertigo in the past, has tried medicine for vertigo previously.  Currently taking olmesartan  20 mg daily. Denies any chest pain, shortness of breath, palpitations, syncope, presyncope, orthopnea, PND, swelling or significant weight changes, or claudication.  ROS: Negative. See HPI.   Studies Reviewed: Becky [name]    EKG: EKG is not  ordered today. EKG from 04/14/2023 reviewed and revealed rate controlled A-flutter.   ABI's 04/28/2023:  Summary:  Right: Resting right ankle-brachial index indicates moderate right lower  extremity arterial disease. The right toe-brachial index is abnormal.   Left: Resting left ankle-brachial index indicates moderate left lower  extremity arterial disease. The left  toe-brachial index is abnormal.    *See table(s) above for measurements and observations.    Suggest follow up study in 12 months.  Echo 04/2023:  1. Left ventricular ejection fraction, by estimation, is 60 to 65%. The  left ventricle has normal function. The left ventricle has no regional  wall motion abnormalities. Left ventricular diastolic parameters are  consistent with Grade II diastolic  dysfunction (pseudonormalization). Elevated left atrial pressure.   2. Right ventricular systolic function is normal. The right ventricular  size is normal. Tricuspid regurgitation signal is inadequate for assessing  PA pressure.   3. Left atrial size was mildly dilated.   4. The mitral valve is normal in structure. Trivial mitral valve  regurgitation. No evidence of mitral stenosis.   5. The aortic valve is tricuspid. Aortic valve regurgitation is mild to  moderate. No aortic stenosis is present.   6. The inferior vena cava is dilated in size with >50% respiratory  variability, suggesting right atrial pressure of 8 mmHg.  CT cardiac 03/2021:  IMPRESSION: 1. The left atrial appendage is a large chicken wing morphology without thrombus.   2. A 27 mm Watchman FLX device is recommended based on the above landing zone measurements (20.0 mm maximum diameter; 26% compression).   3. There is no thrombus in the left atrial appendage.   4. A mid/mid IAS puncture site is recommended.   5. Optimal deployment angle: RAO 1 CRA 23   6. Normal coronary origin. Right dominance. CAC score of 148, which is 94th percentile for age-, sex-, and race-matched controls.  LHC 07/2019:    Prox RCA to Mid RCA lesion is 25% stenosed. Prox LAD to Mid LAD lesion is 25% stenosed. The left ventricular systolic function is normal. The left ventricular ejection fraction is 55-65% by visual estimate. LV end diastolic pressure is normal. LVEDP 15 mm Hg. There is no aortic valve stenosis.   Continue medical  therapy.     Of note, patient was in AFib during the cath.  Rate was controlled and she did not feel palpitations.    Physical Exam:   VS:  BP (!) 142/60 (BP Location: Left Arm, Patient Position: Sitting, Cuff Size: Large)   Pulse 81   Ht 5\' 7"  (1.702 m)   Wt 257 lb 6.4 oz (116.8 kg)   SpO2 97%   BMI 40.31 kg/m    Wt Readings from Last 3 Encounters:  05/06/23 257 lb 6.4 oz (116.8 kg)  05/05/23 256 lb 9.6 oz (116.4 kg)  04/30/23 253 lb 8 oz (115 kg)    Orthostatics:  Lying: 128/81, 66 bpm Sitting: 137/83, 65 bpm Standing: 133/78, 67 bpm Standing x 3 minutes: 132/82, 67 bpm   GEN: Morbidly obese, 63 y.o. female in no acute distress NECK: No JVD; No carotid bruits CARDIAC: S1/S2, RRR, no murmurs, rubs, gallops RESPIRATORY:  Clear to auscultation without rales, wheezing or rhonchi  ABDOMEN: Soft, non-tender, non-distended EXTREMITIES:  No edema; No deformity   ASSESSMENT AND PLAN: .   PAF, bradycardia Denies any tachycardia or palpitations. HR is well controlled today. No more GI bleeding per her report. Discussed tx options regarding recurrent GI bleeding and  OAC and risks vs benefits of OAC treatment. Came to shared medical decision that we will continue current tx plan for now. Did discuss Watchman, however pt declines further workup at this time. Continue diltiazem  dn will d/c Lopressor  to improve HR and BP reports. Continue Xarelto  for stroke prevention. On appropriate dosage. Denies any recent bleeding issues. Heart healthy diet and regular cardiovascular exercise encouraged.   HTN BP mildly elevated today. BP did improve before end of office visit, borderline elevated. Discussed to monitor BP at home at least 2 hours after medications and sitting for 5-10 minutes. D/C Lopressor  as mentioned above. Medical therapy limited d/t her current symptoms. Will continue to monitor. Heart healthy diet and regular cardiovascular exercise encouraged.   HLD Most recent LDL 93. Continue  rosuvastatin . Heart healthy diet and regular cardiovascular exercise encouraged.   Dizziness Most likely r/t vertigo based on patient's description. Orthostatics negative today. Care and ED precautions discussed. Continue to follow with PCP.  Valvular insufficiency Mild to mod AR noted on most recent Echo, 04/2023. Denies any symptoms. Plan to update Echo in 1-2 years or sooner if clinically indicated.   Leg edema/leg pains/PAD Most recent ABI's revealed moderate BLE arterial disease. Will arrange arterial duplex for further evaluation. Continue current medication regimen. Care and ED precautions discussed.   CKD stage 3a Most recent labs show stable kidney disease. Avoid nephrotoxic agents. Continue current medication regimen. Continue to follow with PCP.   Dispo: Follow-up with Dr. Armida Lander or APP in 4-6 weeks or sooner if anything changes.   Signed, Lasalle Pointer, NP

## 2023-05-07 ENCOUNTER — Ambulatory Visit (HOSPITAL_COMMUNITY): Attending: Internal Medicine

## 2023-05-07 ENCOUNTER — Encounter (HOSPITAL_COMMUNITY): Payer: Self-pay

## 2023-05-07 DIAGNOSIS — Z7409 Other reduced mobility: Secondary | ICD-10-CM | POA: Diagnosis present

## 2023-05-07 DIAGNOSIS — R29898 Other symptoms and signs involving the musculoskeletal system: Secondary | ICD-10-CM | POA: Diagnosis present

## 2023-05-07 HISTORY — PX: PORTA CATH INSERTION: CATH118285
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
## 2023-05-12 ENCOUNTER — Telehealth: Payer: Self-pay | Admitting: *Deleted

## 2023-05-12 ENCOUNTER — Encounter (HOSPITAL_COMMUNITY): Payer: Self-pay

## 2023-05-12 ENCOUNTER — Ambulatory Visit (HOSPITAL_COMMUNITY)

## 2023-05-12 ENCOUNTER — Telehealth: Payer: Self-pay

## 2023-05-12 DIAGNOSIS — R29898 Other symptoms and signs involving the musculoskeletal system: Secondary | ICD-10-CM

## 2023-05-12 DIAGNOSIS — Z7409 Other reduced mobility: Secondary | ICD-10-CM

## 2023-05-12 NOTE — Telephone Encounter (Signed)
 PA done for Dexlansoprazole  60mg  on Cover My Meds. Dx used: K21.9--GERD. Pt has tried/failed pantoprazole , nexium , and Omeprazole. Waiting on a response.
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
## 2023-05-13 NOTE — Telephone Encounter (Signed)
 PA denied by the pt's insurance company. Her insurance sent documentation to us  regarding pt can get name brand Dexilant  without PA that's why it was denied. Documentation on your desk in the yellow folder

## 2023-05-14 ENCOUNTER — Ambulatory Visit (HOSPITAL_COMMUNITY)
Admission: RE | Admit: 2023-05-14 | Discharge: 2023-05-14 | Disposition: A | Discharge status: Home / Self Care | Payer: BC Managed Care – PPO | Source: Ambulatory Visit | Attending: Student | Admitting: Student

## 2023-05-14 ENCOUNTER — Telehealth: Payer: Self-pay | Admitting: Cardiology

## 2023-05-14 DIAGNOSIS — R0602 Shortness of breath: Secondary | ICD-10-CM | POA: Insufficient documentation

## 2023-05-14 DIAGNOSIS — R9389 Abnormal findings on diagnostic imaging of other specified body structures: Secondary | ICD-10-CM | POA: Insufficient documentation

## 2023-05-14 MED ORDER — IOHEXOL 300 MG/ML  SOLN
80.0000 mL | Freq: Once | INTRAMUSCULAR | Status: AC | PRN
Start: 2023-05-14 — End: 2023-05-14
  Administered 2023-05-14: 80 mL via INTRAVENOUS

## 2023-05-14 NOTE — Telephone Encounter (Signed)
 Ammon Bales is calling with a call report for the patient's CT Scan. Loyce Ruffini stated the best call back number will be [phone].

## 2023-05-14 NOTE — Telephone Encounter (Signed)
 Chest CT done today, results in Epic

## 2023-05-15 ENCOUNTER — Other Ambulatory Visit: Payer: Self-pay | Admitting: Student

## 2023-05-15 ENCOUNTER — Encounter (HOSPITAL_COMMUNITY): Payer: Self-pay

## 2023-05-15 ENCOUNTER — Ambulatory Visit (HOSPITAL_COMMUNITY)

## 2023-05-15 DIAGNOSIS — R29898 Other symptoms and signs involving the musculoskeletal system: Secondary | ICD-10-CM

## 2023-05-15 DIAGNOSIS — Z7409 Other reduced mobility: Secondary | ICD-10-CM

## 2023-05-15 DIAGNOSIS — R918 Other nonspecific abnormal finding of lung field: Secondary | ICD-10-CM

## 2023-05-15 DIAGNOSIS — E278 Other specified disorders of adrenal gland: Secondary | ICD-10-CM

## 2023-05-15 DIAGNOSIS — R9389 Abnormal findings on diagnostic imaging of other specified body structures: Secondary | ICD-10-CM

## 2023-05-15 NOTE — Telephone Encounter (Signed)
    Pt is returning call to get CT result 

## 2023-05-15 NOTE — Telephone Encounter (Signed)
 NOTED
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
## 2023-05-15 NOTE — Telephone Encounter (Signed)
 Our PA, B.Strader, was attempting to reach patient. I will forward message to her.

## 2023-05-19 ENCOUNTER — Ambulatory Visit (HOSPITAL_COMMUNITY)

## 2023-05-21 ENCOUNTER — Encounter (HOSPITAL_COMMUNITY)
Admission: RE | Admit: 2023-05-21 | Discharge: 2023-05-21 | Disposition: A | Discharge status: Home / Self Care | Source: Ambulatory Visit | Attending: Student | Admitting: Student

## 2023-05-21 DIAGNOSIS — E278 Other specified disorders of adrenal gland: Secondary | ICD-10-CM | POA: Diagnosis present

## 2023-05-21 DIAGNOSIS — R9389 Abnormal findings on diagnostic imaging of other specified body structures: Secondary | ICD-10-CM | POA: Diagnosis present

## 2023-05-21 DIAGNOSIS — R918 Other nonspecific abnormal finding of lung field: Secondary | ICD-10-CM | POA: Insufficient documentation

## 2023-05-21 MED ORDER — FLUDEOXYGLUCOSE F - 18 (FDG) INJECTION
11.5300 | Freq: Once | INTRAVENOUS | Status: AC | PRN
  Administered 2023-05-21: 11.53 via INTRAVENOUS

## 2023-05-22 ENCOUNTER — Ambulatory Visit (HOSPITAL_COMMUNITY)

## 2023-05-22 ENCOUNTER — Ambulatory Visit: Payer: Self-pay | Admitting: Student

## 2023-05-22 ENCOUNTER — Other Ambulatory Visit: Payer: Self-pay

## 2023-05-22 DIAGNOSIS — Z7409 Other reduced mobility: Secondary | ICD-10-CM | POA: Diagnosis not present

## 2023-05-22 DIAGNOSIS — R29898 Other symptoms and signs involving the musculoskeletal system: Secondary | ICD-10-CM

## 2023-05-22 DIAGNOSIS — C7800 Secondary malignant neoplasm of unspecified lung: Secondary | ICD-10-CM
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
## 2023-05-26 ENCOUNTER — Ambulatory Visit (HOSPITAL_COMMUNITY)

## 2023-05-26 ENCOUNTER — Encounter

## 2023-05-26 DIAGNOSIS — R29898 Other symptoms and signs involving the musculoskeletal system: Secondary | ICD-10-CM

## 2023-05-26 DIAGNOSIS — Z7409 Other reduced mobility: Secondary | ICD-10-CM
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
## 2023-05-27 ENCOUNTER — Ambulatory Visit: Admitting: Student in an Organized Health Care Education/Training Program

## 2023-05-27 ENCOUNTER — Encounter: Payer: Self-pay | Admitting: Student in an Organized Health Care Education/Training Program

## 2023-05-27 ENCOUNTER — Telehealth: Payer: Self-pay

## 2023-05-27 VITALS — BP 138/66 | HR 80 | Ht 67.0 in | Wt 253.4 lb

## 2023-05-27 DIAGNOSIS — Z87891 Personal history of nicotine dependence: Secondary | ICD-10-CM

## 2023-05-27 DIAGNOSIS — R918 Other nonspecific abnormal finding of lung field: Secondary | ICD-10-CM | POA: Insufficient documentation

## 2023-05-27 MED ORDER — DEXLANSOPRAZOLE 60 MG PO CPDR: 60.0000 mg | DELAYED_RELEASE_CAPSULE | Freq: Every day | ORAL | 3 refills | Status: DC

## 2023-05-27 NOTE — Telephone Encounter (Signed)
 Noted. I sent in again and requested brand name only.

## 2023-05-27 NOTE — Addendum Note (Signed)
 Addended by: Delman Ferns on: 05/27/2023 02:08 PM   Modules accepted: Orders
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
## 2023-05-27 NOTE — Telephone Encounter (Signed)
 [facility] sent back documentation regarding missing / illegible information on Rx. Clarification needed please send as DAW:1 Brand Name Necessary for pt's Dexilant .

## 2023-05-27 NOTE — Telephone Encounter (Signed)
 Patient is aware of date and time. Bronch email has been sent.
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
## 2023-05-27 NOTE — Telephone Encounter (Signed)
 Robotic Bronch with EBUS 06/02/2023 at 10:00am Lung Nodule [id number], [id number], [id number]  Almira Armour please see Bronch info.

## 2023-05-28 ENCOUNTER — Ambulatory Visit (HOSPITAL_COMMUNITY)
Admission: RE | Admit: 2023-05-28 | Discharge: 2023-05-28 | Disposition: A | Discharge status: Home / Self Care | Source: Ambulatory Visit | Attending: Student in an Organized Health Care Education/Training Program | Admitting: Student in an Organized Health Care Education/Training Program

## 2023-05-28 DIAGNOSIS — R918 Other nonspecific abnormal finding of lung field: Secondary | ICD-10-CM | POA: Diagnosis present

## 2023-05-29 ENCOUNTER — Other Ambulatory Visit: Payer: Self-pay

## 2023-05-29 ENCOUNTER — Telehealth (HOSPITAL_COMMUNITY): Payer: Self-pay

## 2023-05-29 ENCOUNTER — Ambulatory Visit (INDEPENDENT_AMBULATORY_CARE_PROVIDER_SITE_OTHER): Admitting: Student in an Organized Health Care Education/Training Program

## 2023-05-29 ENCOUNTER — Ambulatory Visit (HOSPITAL_COMMUNITY)

## 2023-05-29 ENCOUNTER — Encounter
Admission: RE | Admit: 2023-05-29 | Discharge: 2023-05-29 | Disposition: A | Discharge status: Home / Self Care | Source: Ambulatory Visit | Attending: Pulmonary Disease | Admitting: Pulmonary Disease

## 2023-05-29 VITALS — Ht 67.0 in | Wt 253.0 lb

## 2023-05-29 DIAGNOSIS — Z01812 Encounter for preprocedural laboratory examination: Secondary | ICD-10-CM

## 2023-05-29 DIAGNOSIS — R918 Other nonspecific abnormal finding of lung field: Secondary | ICD-10-CM | POA: Diagnosis not present

## 2023-05-29 DIAGNOSIS — E118 Type 2 diabetes mellitus with unspecified complications: Secondary | ICD-10-CM

## 2023-05-29 HISTORY — DX: Atherosclerotic heart disease of native coronary artery without angina pectoris: I25.10

## 2023-05-29 HISTORY — DX: Anemia, unspecified: D64.9

## 2023-05-29 HISTORY — DX: Type 2 diabetes mellitus without complications: E11.9

## 2023-05-29 LAB — PULMONARY FUNCTION TEST
DL/VA % pred: 119 %
DL/VA: 4.92 ml/min/mmHg/L
DLCO unc % pred: 79 %
DLCO unc: 17.49 ml/min/mmHg
FEF 25-75 Post: 2.56 L/s
FEF 25-75 Pre: 1.88 L/s
FEF2575-%Change-Post: 35 %
FEF2575-%Pred-Post: 103 %
FEF2575-%Pred-Pre: 75 %
FEV1-%Change-Post: 6 %
FEV1-%Pred-Post: 73 %
FEV1-%Pred-Pre: 69 %
FEV1-Post: 2.08 L
FEV1-Pre: 1.95 L
FEV1FVC-%Change-Post: 6 %
FEV1FVC-%Pred-Pre: 100 %
FEV6-%Change-Post: 1 %
FEV6-%Pred-Post: 70 %
FEV6-%Pred-Pre: 69 %
FEV6-Post: 2.48 L
FEV6-Pre: 2.45 L
FEV6FVC-%Change-Post: 1 %
FEV6FVC-%Pred-Post: 103 %
FEV6FVC-%Pred-Pre: 102 %
FVC-%Change-Post: 0 %
FVC-%Pred-Post: 67 %
FVC-%Pred-Pre: 67 %
FVC-Post: 2.48 L
FVC-Pre: 2.48 L
Post FEV1/FVC ratio: 84 %
Post FEV6/FVC ratio: 100 %
Pre FEV1/FVC ratio: 79 %
Pre FEV6/FVC Ratio: 99 %
RV % pred: 71 %
RV: 1.55 L
TLC % pred: 73 %
TLC: 4.05 L

## 2023-05-29 NOTE — Patient Instructions (Signed)
 Full PFT completed today ? ?

## 2023-05-29 NOTE — Patient Instructions (Addendum)
 Your procedure is scheduled on:    TUESDAY MAY 27  Report to the Registration Desk on the 1st floor of the [facility]. To find out your arrival time, please call [phone] between 1PM - 3PM on:   MONDAY MAY 26  If your arrival time is 6:00 am, do not arrive before that time as the [facility] entrance doors do not open until 6:00 am.  REMEMBER: Instructions that are not followed completely may result in serious medical risk, up to and including death; or upon the discretion of your surgeon and anesthesiologist your surgery may need to be rescheduled.  Do not eat food after midnight the night before surgery.  No gum chewing or hard candies.  You may however, drink WATER   up to 2 hours before you are scheduled to arrive for your surgery. Do not drink anything within 2 hours of your scheduled arrival time.   One week prior to surgery: STARTING FRIDAY MAY 23  Stop Anti-inflammatories (NSAIDS) such as Advil , Aleve , Ibuprofen , Motrin , Naproxen , Naprosyn  and Aspirin  based products such as Excedrin, Goody's Powder, BC Powder. Stop ANY OVER THE COUNTER supplements until after surgery. Cholecalciferol  (VITAMIN D -3 )  You may however, continue to take Tylenol  if needed for pain up until the day of surgery.  **Follow guidelines for insulin  and diabetes medications.** glimepiride (AMARYL) hold the morning of surgery, last dose MONDAY MAY 26  **Follow recommendations regarding stopping blood thinners.** rivaroxaban  (XARELTO ) ( patient staes she was instructed to hold medication for 2 days) Last dose SATURDAY MAY 24  Continue taking all of your other prescription medications up until the day of surgery.  ON THE DAY OF SURGERY ONLY TAKE THESE MEDICATIONS WITH SIPS OF WATER :  SYNTHROID   dexlansoprazole  (DEXILANT )   Use inhalers on the day of surgery and bring to the hospital. albuterol  (VENTOLIN  HFA)   No Alcohol for 24 hours before or after surgery.  No Smoking including e-cigarettes for  24 hours before surgery.  No chewable tobacco products for at least 6 hours before surgery.  No nicotine patches on the day of surgery.  Do not use any "recreational" drugs for at least a week (preferably 2 weeks) before your surgery.  Please be advised that the combination of cocaine and anesthesia may have negative outcomes, up to and including death. If you test positive for cocaine, your surgery will be cancelled.  On the morning of surgery brush your teeth with toothpaste and water , you may rinse your mouth with mouthwash if you wish. Do not swallow any toothpaste or mouthwash.  Do not wear jewelry, make-up, hairpins, clips or nail polish.  For welded (permanent) jewelry: bracelets, anklets, waist bands, etc.  Please have this removed prior to surgery.  If it is not removed, there is a chance that hospital personnel will need to cut it off on the day of surgery.  Do not wear lotions, powders, or perfumes.   Do not shave body hair from the neck down 48 hours before surgery.  Contact lenses, hearing aids and dentures may not be worn into surgery.  Do not bring valuables to the hospital. [facility] is not responsible for any missing/lost belongings or valuables.   Notify your doctor if there is any change in your medical condition (cold, fever, infection).  Wear comfortable clothing (specific to your surgery type) to the hospital.  After surgery, you can help prevent lung complications by doing breathing exercises.  Take deep breaths and cough every 1-2 hours.  If you are being discharged the day of surgery, you will not be allowed to drive home. You will need a responsible individual to drive you home and stay with you for 24 hours after surgery.   If you are taking public transportation, you will need to have a responsible individual with you.  Please call the Pre-admissions Testing Dept. at [phone] if you have any questions about these instructions.  Surgery  Visitation Policy:  Patients having surgery or a procedure may have two visitors.  Children under the age of 24 must have an adult with them who is not the patient.

## 2023-05-29 NOTE — Telephone Encounter (Signed)
 No show, called and spoke to pt who stated she forgot to cancel. Had apt earlier today to discuss procedure for her lungs next Tuesday. Reviewed future apts and cancelled next apt as well. Reminded apt following and assured she has contact number if needs to cancel/reschedule in the future.   Becky [name], LPTA/CLT; Johnye Napoleon [phone]

## 2023-05-29 NOTE — Progress Notes (Signed)
 Full PFT completed today ? ?

## 2023-06-02 ENCOUNTER — Ambulatory Visit

## 2023-06-02 ENCOUNTER — Ambulatory Visit: Admitting: Anesthesiology

## 2023-06-02 ENCOUNTER — Other Ambulatory Visit: Payer: Self-pay

## 2023-06-02 ENCOUNTER — Encounter
Admission: RE | Disposition: A | Discharge status: Home / Self Care | Payer: Self-pay | Source: Home / Self Care | Attending: Pulmonary Disease

## 2023-06-02 ENCOUNTER — Encounter: Payer: Self-pay | Admitting: Pulmonary Disease

## 2023-06-02 ENCOUNTER — Ambulatory Visit
Admission: RE | Admit: 2023-06-02 | Discharge: 2023-06-02 | Disposition: A | Discharge status: Home / Self Care | Attending: Pulmonary Disease | Admitting: Pulmonary Disease

## 2023-06-02 ENCOUNTER — Ambulatory Visit (HOSPITAL_COMMUNITY)

## 2023-06-02 DIAGNOSIS — E118 Type 2 diabetes mellitus with unspecified complications: Secondary | ICD-10-CM

## 2023-06-02 DIAGNOSIS — K279 Peptic ulcer, site unspecified, unspecified as acute or chronic, without hemorrhage or perforation: Secondary | ICD-10-CM | POA: Diagnosis not present

## 2023-06-02 DIAGNOSIS — E119 Type 2 diabetes mellitus without complications: Secondary | ICD-10-CM | POA: Diagnosis not present

## 2023-06-02 DIAGNOSIS — R918 Other nonspecific abnormal finding of lung field: Secondary | ICD-10-CM | POA: Insufficient documentation

## 2023-06-02 DIAGNOSIS — Z87891 Personal history of nicotine dependence: Secondary | ICD-10-CM | POA: Insufficient documentation

## 2023-06-02 DIAGNOSIS — Z7984 Long term (current) use of oral hypoglycemic drugs: Secondary | ICD-10-CM | POA: Insufficient documentation

## 2023-06-02 DIAGNOSIS — Z7901 Long term (current) use of anticoagulants: Secondary | ICD-10-CM | POA: Insufficient documentation

## 2023-06-02 DIAGNOSIS — Z860101 Personal history of adenomatous and serrated colon polyps: Secondary | ICD-10-CM | POA: Diagnosis not present

## 2023-06-02 DIAGNOSIS — R519 Headache, unspecified: Secondary | ICD-10-CM | POA: Insufficient documentation

## 2023-06-02 DIAGNOSIS — Z8673 Personal history of transient ischemic attack (TIA), and cerebral infarction without residual deficits: Secondary | ICD-10-CM | POA: Diagnosis not present

## 2023-06-02 DIAGNOSIS — K219 Gastro-esophageal reflux disease without esophagitis: Secondary | ICD-10-CM | POA: Insufficient documentation

## 2023-06-02 DIAGNOSIS — Z79890 Hormone replacement therapy (postmenopausal): Secondary | ICD-10-CM | POA: Insufficient documentation

## 2023-06-02 DIAGNOSIS — R0602 Shortness of breath: Secondary | ICD-10-CM | POA: Diagnosis not present

## 2023-06-02 DIAGNOSIS — Z79899 Other long term (current) drug therapy: Secondary | ICD-10-CM | POA: Insufficient documentation

## 2023-06-02 DIAGNOSIS — Z01812 Encounter for preprocedural laboratory examination: Secondary | ICD-10-CM

## 2023-06-02 DIAGNOSIS — I10 Essential (primary) hypertension: Secondary | ICD-10-CM | POA: Insufficient documentation

## 2023-06-02 DIAGNOSIS — I480 Paroxysmal atrial fibrillation: Secondary | ICD-10-CM | POA: Diagnosis not present

## 2023-06-02 DIAGNOSIS — C3411 Malignant neoplasm of upper lobe, right bronchus or lung: Secondary | ICD-10-CM | POA: Diagnosis not present

## 2023-06-02 DIAGNOSIS — C771 Secondary and unspecified malignant neoplasm of intrathoracic lymph nodes: Secondary | ICD-10-CM | POA: Diagnosis not present

## 2023-06-02 DIAGNOSIS — J45909 Unspecified asthma, uncomplicated: Secondary | ICD-10-CM | POA: Diagnosis not present

## 2023-06-02 DIAGNOSIS — G4733 Obstructive sleep apnea (adult) (pediatric): Secondary | ICD-10-CM | POA: Diagnosis not present

## 2023-06-02 DIAGNOSIS — I2510 Atherosclerotic heart disease of native coronary artery without angina pectoris: Secondary | ICD-10-CM | POA: Insufficient documentation

## 2023-06-02 DIAGNOSIS — E039 Hypothyroidism, unspecified: Secondary | ICD-10-CM | POA: Diagnosis not present

## 2023-06-02 DIAGNOSIS — Z833 Family history of diabetes mellitus: Secondary | ICD-10-CM | POA: Diagnosis not present

## 2023-06-02 HISTORY — PX: BRONCHOSCOPY, WITH BIOPSY USING ELECTROMAGNETIC NAVIGATION: SHX7536

## 2023-06-02 HISTORY — PX: ENDOBRONCHIAL ULTRASOUND: SHX5096

## 2023-06-02 LAB — GLUCOSE, CAPILLARY
Glucose-Capillary: 108 mg/dL — ABNORMAL HIGH (ref 70–99)
Glucose-Capillary: 110 mg/dL — ABNORMAL HIGH (ref 70–99)

## 2023-06-02 SURGERY — BRONCHOSCOPY, WITH BIOPSY USING ELECTROMAGNETIC NAVIGATION
Anesthesia: General | Laterality: Right

## 2023-06-02 MED ORDER — DEXILANT 60 MG PO CPDR: 60.0000 mg | DELAYED_RELEASE_CAPSULE | Freq: Every day | ORAL | 3 refills | Status: DC

## 2023-06-02 MED ORDER — DROPERIDOL 2.5 MG/ML IJ SOLN
0.6250 mg | Freq: Once | INTRAMUSCULAR | Status: AC
  Administered 2023-06-02: 0.625 mg via INTRAVENOUS

## 2023-06-02 MED ORDER — OXYCODONE HCL 5 MG/5ML PO SOLN: 5.0000 mg | Freq: Once | ORAL | Status: DC | PRN

## 2023-06-02 MED ORDER — FENTANYL CITRATE (PF) 100 MCG/2ML IJ SOLN
INTRAMUSCULAR | Status: DC | PRN
  Administered 2023-06-02: 75 ug via INTRAVENOUS
  Administered 2023-06-02: 25 ug via INTRAVENOUS

## 2023-06-02 MED ORDER — SODIUM CHLORIDE 0.9 % IV SOLN: INTRAVENOUS | Status: DC

## 2023-06-02 MED ORDER — ROCURONIUM BROMIDE 100 MG/10ML IV SOLN
INTRAVENOUS | Status: DC | PRN
  Administered 2023-06-02: 80 mg via INTRAVENOUS

## 2023-06-02 MED ORDER — PHENYLEPHRINE 80 MCG/ML (10ML) SYRINGE FOR IV PUSH (FOR BLOOD PRESSURE SUPPORT)
PREFILLED_SYRINGE | INTRAVENOUS | Status: DC | PRN
  Administered 2023-06-02 (×3): 80 ug via INTRAVENOUS

## 2023-06-02 MED ORDER — LIDOCAINE HCL (PF) 2 % IJ SOLN
INTRAMUSCULAR | Status: AC
  Filled 2023-06-02: qty 5

## 2023-06-02 MED ORDER — ORAL CARE MOUTH RINSE: 15.0000 mL | Freq: Once | OROMUCOSAL | Status: AC

## 2023-06-02 MED ORDER — CHLORHEXIDINE GLUCONATE 0.12 % MT SOLN
OROMUCOSAL | Status: AC
  Filled 2023-06-02: qty 15

## 2023-06-02 MED ORDER — PROPOFOL 10 MG/ML IV BOLUS
INTRAVENOUS | Status: AC
  Filled 2023-06-02: qty 20

## 2023-06-02 MED ORDER — PHENYLEPHRINE HCL-NACL 20-0.9 MG/250ML-% IV SOLN
INTRAVENOUS | Status: AC
  Filled 2023-06-02: qty 250

## 2023-06-02 MED ORDER — EPHEDRINE SULFATE-NACL 50-0.9 MG/10ML-% IV SOSY
PREFILLED_SYRINGE | INTRAVENOUS | Status: DC | PRN
  Administered 2023-06-02: 10 mg via INTRAVENOUS
  Administered 2023-06-02: 5 mg via INTRAVENOUS

## 2023-06-02 MED ORDER — DEXAMETHASONE SODIUM PHOSPHATE 10 MG/ML IJ SOLN
INTRAMUSCULAR | Status: DC | PRN
  Administered 2023-06-02: 8 mg via INTRAVENOUS

## 2023-06-02 MED ORDER — PHENYLEPHRINE HCL-NACL 20-0.9 MG/250ML-% IV SOLN
INTRAVENOUS | Status: DC | PRN
  Administered 2023-06-02: 20 ug/min via INTRAVENOUS

## 2023-06-02 MED ORDER — PROPOFOL 10 MG/ML IV BOLUS
INTRAVENOUS | Status: DC | PRN
  Administered 2023-06-02: 100 mg via INTRAVENOUS
  Administered 2023-06-02: 120 ug/kg/min via INTRAVENOUS
  Administered 2023-06-02: 100 mg via INTRAVENOUS

## 2023-06-02 MED ORDER — DEXMEDETOMIDINE HCL IN NACL 80 MCG/20ML IV SOLN
INTRAVENOUS | Status: DC | PRN
  Administered 2023-06-02: 8 ug via INTRAVENOUS

## 2023-06-02 MED ORDER — SUGAMMADEX SODIUM 200 MG/2ML IV SOLN
INTRAVENOUS | Status: DC | PRN
  Administered 2023-06-02: 200 mg via INTRAVENOUS

## 2023-06-02 MED ORDER — FENTANYL CITRATE (PF) 100 MCG/2ML IJ SOLN: 25.0000 ug | INTRAMUSCULAR | Status: DC | PRN

## 2023-06-02 MED ORDER — PHENYLEPHRINE 80 MCG/ML (10ML) SYRINGE FOR IV PUSH (FOR BLOOD PRESSURE SUPPORT)
PREFILLED_SYRINGE | INTRAVENOUS | Status: AC
  Filled 2023-06-02: qty 10

## 2023-06-02 MED ORDER — OXYCODONE HCL 5 MG PO TABS: 5.0000 mg | ORAL_TABLET | Freq: Once | ORAL | Status: DC | PRN

## 2023-06-02 MED ORDER — EPHEDRINE 5 MG/ML INJ
INTRAVENOUS | Status: AC
  Filled 2023-06-02: qty 5

## 2023-06-02 MED ORDER — ESMOLOL HCL 100 MG/10ML IV SOLN
INTRAVENOUS | Status: AC
  Filled 2023-06-02: qty 10

## 2023-06-02 MED ORDER — FENTANYL CITRATE (PF) 100 MCG/2ML IJ SOLN
INTRAMUSCULAR | Status: AC
  Filled 2023-06-02: qty 2

## 2023-06-02 MED ORDER — ESMOLOL HCL 100 MG/10ML IV SOLN
INTRAVENOUS | Status: DC | PRN
  Administered 2023-06-02 (×4): 10 mg via INTRAVENOUS

## 2023-06-02 MED ORDER — DROPERIDOL 2.5 MG/ML IJ SOLN
INTRAMUSCULAR | Status: AC
  Filled 2023-06-02: qty 2

## 2023-06-02 MED ORDER — CHLORHEXIDINE GLUCONATE 0.12 % MT SOLN
15.0000 mL | Freq: Once | OROMUCOSAL | Status: AC
  Administered 2023-06-02: 15 mL via OROMUCOSAL

## 2023-06-02 MED ORDER — PROPOFOL 1000 MG/100ML IV EMUL
INTRAVENOUS | Status: AC
  Filled 2023-06-02: qty 100

## 2023-06-02 MED ORDER — ROCURONIUM BROMIDE 10 MG/ML (PF) SYRINGE
PREFILLED_SYRINGE | INTRAVENOUS | Status: AC
  Filled 2023-06-02: qty 10

## 2023-06-02 MED ORDER — LIDOCAINE HCL (CARDIAC) PF 100 MG/5ML IV SOSY
PREFILLED_SYRINGE | INTRAVENOUS | Status: DC | PRN
  Administered 2023-06-02: 100 mg via INTRAVENOUS

## 2023-06-02 MED ORDER — ONDANSETRON HCL 4 MG/2ML IJ SOLN
INTRAMUSCULAR | Status: DC | PRN
  Administered 2023-06-02: 4 mg via INTRAVENOUS

## 2023-06-02 MED ORDER — ONDANSETRON HCL 4 MG/2ML IJ SOLN
INTRAMUSCULAR | Status: AC
  Filled 2023-06-02: qty 2

## 2023-06-02 MED ORDER — MIDAZOLAM HCL 2 MG/2ML IJ SOLN
INTRAMUSCULAR | Status: AC
  Filled 2023-06-02: qty 2

## 2023-06-02 MED ORDER — DEXAMETHASONE SODIUM PHOSPHATE 10 MG/ML IJ SOLN
INTRAMUSCULAR | Status: AC
  Filled 2023-06-02: qty 1

## 2023-06-02 NOTE — Interval H&P Note (Signed)
 History and Physical Interval Note:  06/02/2023 11:31 AM  Becky [name]  has presented today for surgery, with the diagnosis of lung mass.  The various methods of treatment have been discussed with the patient and family. After consideration of risks, benefits and other options for treatment, the patient has consented to  Procedure(s): ROBOTIC ASSISTED NAVIGATIONAL BRONCHOSCOPY (Right) ENDOBRONCHIAL ULTRASOUND (EBUS) (Bilateral) as a surgical intervention.  The patient's history has been reviewed, patient examined, no change in status, stable for surgery.  I have reviewed the patient's chart and labs.  Questions were answered to the patient's satisfaction.     [name]

## 2023-06-02 NOTE — Progress Notes (Signed)
 On-call neurology note  I was called by the pulmonologist regarding this patient.  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 Anesthesia was concerned due to the recent strokes further risk of complications. Although risk of complications is higher in someone who has had strokes in the recent 3 to 6 months, the most recent guidelines are to hold for elective procedures but in her case, the embolic strokes are likely from malignancy and the diagnosis of malignancy will aid the treatment going forward of not just the lung mass but also help in managing or mitigating further thromboembolic events including strokes. She will have higher than the risk of periprocedural stroke but at this point the benefits of the procedure definitely outweigh the risk in my mind and it should be okay to proceed from a neuro logical standpoint.  -- Tona Francis, MD Neurologist [facility]

## 2023-06-02 NOTE — Anesthesia Postprocedure Evaluation (Signed)
 Anesthesia Post Note  [name]  Procedure(s) Performed: ROBOTIC ASSISTED NAVIGATIONAL BRONCHOSCOPY (Right) ENDOBRONCHIAL ULTRASOUND (EBUS) (Bilateral)  Patient location during evaluation: PACU Anesthesia Type: General Level of consciousness: awake and alert Pain management: pain level controlled Vital Signs Assessment: post-procedure vital signs reviewed and stable Respiratory status: spontaneous breathing, nonlabored ventilation, respiratory function stable and patient connected to nasal cannula oxygen Cardiovascular status: blood pressure returned to baseline and stable Postop Assessment: no apparent nausea or vomiting Anesthetic complications: no   No notable events documented.   Last Vitals:  Vitals:   06/02/23 1238 06/02/23 1240  BP:  135/75  Pulse:  70  Resp:  (!) 22  Temp: (!) 36.1 C (!) 36.1 C  SpO2:  100%    Last Pain:  Vitals:   06/02/23 1240  TempSrc: Temporal  PainSc: 0-No pain                 Portia Brittle Shelsey Rieth

## 2023-06-02 NOTE — Telephone Encounter (Signed)
Noted. Pharmacy made aware.  

## 2023-06-02 NOTE — Transfer of Care (Signed)
 Immediate Anesthesia Transfer of Care Note  [name]  Procedure(s) Performed: ROBOTIC ASSISTED NAVIGATIONAL BRONCHOSCOPY (Right) ENDOBRONCHIAL ULTRASOUND (EBUS) (Bilateral)  Patient Location: PACU  Anesthesia Type:General  Level of Consciousness: awake, alert , oriented, and patient cooperative  Airway & Oxygen Therapy: Patient Spontanous Breathing and Patient connected to face mask oxygen  Post-op Assessment: Report given to RN and Post -op Vital signs reviewed and stable  Post vital signs: Reviewed and stable  Last Vitals:  Vitals Value Taken Time  BP 135/75 06/02/23 1240  Temp 36.1 C 06/02/23 1240  Pulse 70 06/02/23 1240  Resp 22 06/02/23 1240  SpO2 100 % 06/02/23 1240    Last Pain:  Vitals:   06/02/23 1240  TempSrc: Temporal  PainSc: 0-No pain         Complications: No notable events documented.

## 2023-06-02 NOTE — Op Note (Signed)
 Video Bronchoscopy with Robotic Assisted Bronchoscopic Navigation   Date of Operation: 06/02/2023   Pre-op Diagnosis: RUL Mass  Post-op Diagnosis: Same as pre  Surgeon: Philo Kurtz  Anesthesia: General endotracheal anesthesia  Operation: Flexible video fiberoptic bronchoscopy with robotic assistance and biopsies.  Estimated Blood Loss: Minimal  Complications: None  Indications and History: Becky [name] is a 63 y.o. female with history of RUL Mass.  Recommendation made to achieve a tissue diagnosis via robotic assisted navigational bronchoscopy.  The risks, benefits, complications, treatment options and expected outcomes were discussed with the patient.  The possibilities of pneumothorax, pneumonia, reaction to medication, pulmonary aspiration, perforation of a viscus, bleeding, failure to diagnose a condition and creating a complication requiring transfusion or operation were discussed with the patient who freely signed the consent.    Description of Procedure: The patient was seen in the Preoperative Area, was examined and was deemed appropriate to proceed.  The patient was taken to [facility] Endoscopy room 3, identified as Becky [name] and the procedure verified as Flexible Video Fiberoptic Bronchoscopy.  A Time Out was held and the above information confirmed.   Prior to the date of the procedure a high-resolution CT scan of the chest was performed. Utilizing ION software program a virtual tracheobronchial tree was generated to allow the creation of distinct navigation pathways to the patient's parenchymal abnormalities. After being taken to the operating room general anesthesia was initiated and the patient  was orally intubated. The video fiberoptic bronchoscope was introduced via the endotracheal tube and a general inspection was performed which showed normal right and left lung anatomy. Aspiration of the bilateral mainstems was completed to remove any remaining secretions. Robotic  catheter inserted into patient's endotracheal tube.   Target #1 RUL Mass: The distinct navigation pathways prepared prior to this procedure were then utilized to navigate to patient's lesion identified on CT scan. The robotic catheter was secured into place and the vision probe was withdrawn.  Lesion location was approximated using fluoroscopy.  Local registration and targeting was performed using GE mobile C-arm three-dimensional imaging and radial EBUS. Under fluoroscopic guidance transbronchial needle brushings, transbronchial needle biopsies, and transbronchial forceps biopsies were performed to be sent for cytology and pathology.  Needle-in-lesion was confirmed using GE mobile C-arm.  A bronchioalveolar lavage was performed in the RUL and sent for cytology and microbiology.  Under fluoroscopic guidance a single fiducial marker was placed adjacent to the nodule.  EBUS survey revealed enlarged 7 and 11ri which were sampled.  At the end of the procedure a general airway inspection was performed and there was no evidence of active bleeding. The bronchoscope was removed.  The patient tolerated the procedure well. There was no significant blood loss and there were no obvious complications. A post-procedural chest x-ray is pending.  Samples Target #1: 1. Transbronchial needle brushings from RUL Mass 2. Transbronchial needle biopsies from RUL Mass 3. Transbronchial forceps biopsies from RUL Mass 4. Bronchoalveolar lavage from RUL  EBUS-TBNA Station 7 and 11Ri.  Plans:  The patient will be discharged from the PACU to home when recovered from anesthesia and after chest x-ray is reviewed. We will review the cytology, pathology and microbiology results with the patient when they become available. Outpatient followup will be with Dr. Darnelle Elders.    Becky Kindler, MD [facility] 06/02/2023 11:35 AM    Tool-in-lesion RUL Mass.
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
## 2023-06-02 NOTE — Addendum Note (Signed)
 Addended by: Delman Ferns on: 06/02/2023 10:47 AM   Modules accepted: Orders

## 2023-06-02 NOTE — Telephone Encounter (Signed)
 Completed.

## 2023-06-03 ENCOUNTER — Encounter: Payer: Self-pay | Admitting: Pulmonary Disease

## 2023-06-03 NOTE — Telephone Encounter (Signed)
 Noted. Nothing further needed.

## 2023-06-03 NOTE — Telephone Encounter (Signed)
 For the codes 47829, [id number], [id number] Auth # [id number] valid 06/02/23 to 06/06/2023

## 2023-06-04 ENCOUNTER — Ambulatory Visit: Payer: Self-pay

## 2023-06-04 ENCOUNTER — Encounter: Payer: Self-pay | Admitting: Internal Medicine

## 2023-06-04 ENCOUNTER — Inpatient Hospital Stay

## 2023-06-04 ENCOUNTER — Ambulatory Visit (INDEPENDENT_AMBULATORY_CARE_PROVIDER_SITE_OTHER): Admitting: Internal Medicine

## 2023-06-04 ENCOUNTER — Inpatient Hospital Stay: Attending: Oncology | Admitting: Oncology

## 2023-06-04 ENCOUNTER — Ambulatory Visit (HOSPITAL_COMMUNITY)
Admission: RE | Admit: 2023-06-04 | Discharge: 2023-06-04 | Disposition: A | Discharge status: Home / Self Care | Source: Ambulatory Visit | Attending: Internal Medicine | Admitting: Internal Medicine

## 2023-06-04 VITALS — BP 115/61 | HR 64 | Temp 97.9°F | Resp 18 | Ht 67.0 in | Wt 257.0 lb

## 2023-06-04 VITALS — BP 142/84 | HR 73 | Ht 67.0 in | Wt 254.8 lb

## 2023-06-04 DIAGNOSIS — D649 Anemia, unspecified: Secondary | ICD-10-CM

## 2023-06-04 DIAGNOSIS — C7951 Secondary malignant neoplasm of bone: Secondary | ICD-10-CM | POA: Insufficient documentation

## 2023-06-04 DIAGNOSIS — F419 Anxiety disorder, unspecified: Secondary | ICD-10-CM | POA: Insufficient documentation

## 2023-06-04 DIAGNOSIS — R042 Hemoptysis: Secondary | ICD-10-CM

## 2023-06-04 DIAGNOSIS — D3502 Benign neoplasm of left adrenal gland: Secondary | ICD-10-CM | POA: Diagnosis not present

## 2023-06-04 DIAGNOSIS — C3491 Malignant neoplasm of unspecified part of right bronchus or lung: Secondary | ICD-10-CM

## 2023-06-04 DIAGNOSIS — I10 Essential (primary) hypertension: Secondary | ICD-10-CM | POA: Diagnosis not present

## 2023-06-04 DIAGNOSIS — I4891 Unspecified atrial fibrillation: Secondary | ICD-10-CM | POA: Diagnosis not present

## 2023-06-04 DIAGNOSIS — C7972 Secondary malignant neoplasm of left adrenal gland: Secondary | ICD-10-CM

## 2023-06-04 DIAGNOSIS — C7801 Secondary malignant neoplasm of right lung: Secondary | ICD-10-CM

## 2023-06-04 DIAGNOSIS — C3411 Malignant neoplasm of upper lobe, right bronchus or lung: Secondary | ICD-10-CM | POA: Insufficient documentation

## 2023-06-04 DIAGNOSIS — Z8673 Personal history of transient ischemic attack (TIA), and cerebral infarction without residual deficits: Secondary | ICD-10-CM | POA: Insufficient documentation

## 2023-06-04 DIAGNOSIS — Z87891 Personal history of nicotine dependence: Secondary | ICD-10-CM

## 2023-06-04 DIAGNOSIS — C3490 Malignant neoplasm of unspecified part of unspecified bronchus or lung: Secondary | ICD-10-CM | POA: Insufficient documentation

## 2023-06-04 DIAGNOSIS — C7970 Secondary malignant neoplasm of unspecified adrenal gland: Secondary | ICD-10-CM | POA: Insufficient documentation

## 2023-06-04 LAB — COMPREHENSIVE METABOLIC PANEL WITH GFR
ALT: 16 U/L (ref 0–44)
AST: 15 U/L (ref 15–41)
Albumin: 4.4 g/dL (ref 3.5–5.0)
Alkaline Phosphatase: 76 U/L (ref 38–126)
Anion gap: 9 (ref 5–15)
BUN: 22 mg/dL (ref 8–23)
CO2: 22 mmol/L (ref 22–32)
Calcium: 10 mg/dL (ref 8.9–10.3)
Chloride: 107 mmol/L (ref 98–111)
Creatinine, Ser: 1.16 mg/dL — ABNORMAL HIGH (ref 0.44–1.00)
GFR, Estimated: 54 mL/min — ABNORMAL LOW (ref >60)
Glucose, Bld: 90 mg/dL (ref 70–99)
Potassium: 4.3 mmol/L (ref 3.5–5.1)
Sodium: 138 mmol/L (ref 135–145)
Total Bilirubin: 0.7 mg/dL (ref 0.0–1.2)
Total Protein: 7.3 g/dL (ref 6.5–8.1)

## 2023-06-04 LAB — CBC WITH DIFFERENTIAL/PLATELET
Abs Immature Granulocytes: 0.09 10*3/uL — ABNORMAL HIGH (ref 0.00–0.07)
Basophils Absolute: 0 10*3/uL (ref 0.0–0.1)
Basophils Relative: 0 %
Eosinophils Absolute: 0 10*3/uL (ref 0.0–0.5)
Eosinophils Relative: 0 %
HCT: 36.6 % (ref 36.0–46.0)
Hemoglobin: 12.2 g/dL (ref 12.0–15.0)
Immature Granulocytes: 1 %
Lymphocytes Relative: 19 %
Lymphs Abs: 2.6 10*3/uL (ref 0.7–4.0)
MCH: 30.8 pg (ref 26.0–34.0)
MCHC: 33.3 g/dL (ref 30.0–36.0)
MCV: 92.4 fL (ref 80.0–100.0)
Monocytes Absolute: 1.1 10*3/uL — ABNORMAL HIGH (ref 0.1–1.0)
Monocytes Relative: 8 %
Neutro Abs: 9.8 10*3/uL — ABNORMAL HIGH (ref 1.7–7.7)
Neutrophils Relative %: 72 %
Platelets: 271 10*3/uL (ref 150–400)
RBC: 3.96 MIL/uL (ref 3.87–5.11)
RDW: 13.2 % (ref 11.5–15.5)
WBC: 13.6 10*3/uL — ABNORMAL HIGH (ref 4.0–10.5)
nRBC: 0 % (ref 0.0–0.2)

## 2023-06-04 LAB — IRON AND TIBC
Iron: 84 ug/dL (ref 28–170)
Saturation Ratios: 22 % (ref 10.4–31.8)
TIBC: 382 ug/dL (ref 250–450)
UIBC: 298 ug/dL

## 2023-06-04 LAB — CYTOLOGY - NON PAP

## 2023-06-04 LAB — FOLATE: Folate: 5.6 ng/mL — ABNORMAL LOW (ref >5.9)

## 2023-06-04 LAB — SURGICAL PATHOLOGY

## 2023-06-04 LAB — FERRITIN: Ferritin: 42 ng/mL (ref 11–307)

## 2023-06-04 LAB — VITAMIN B12: Vitamin B-12: 234 pg/mL (ref 180–914)

## 2023-06-04 NOTE — Assessment & Plan Note (Addendum)
 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  - Will order Caris testing on tissue and Guardant360 today - Will discuss at tumor board to determine treatment options including chemoradiation followed by immunotherapy and radiation to oligometastasis versus targeted therapy/chemoimmunotherapy based off of Caris testing. - Will arrange for chemo port placement - Will refer to radiation oncology for possible palliative radiation to rib lesion and may be chemoradiation to the lung lesion -Patient had MRI brain done on 04/07/2023 after the discovery of mass on initial chest x-ray from 02/2023.  Patient has no new symptoms and no indication for a new MRI. - Discussed and emphasized that treatment is with palliative intent and not curative intent.  Discussed maintaining quality of life and functionality during treatment.  Return to clinic in 2 weeks to discuss further management

## 2023-06-04 NOTE — Patient Instructions (Addendum)
 Please remember to go to the  x-ray department  @  [facility] for your tests - we will call you with the results when they are available     Continue to hold the xarelto  as already instructed   Stop the incentive spirometry and the cpap until not bleeding

## 2023-06-04 NOTE — Telephone Encounter (Addendum)
 Chief Complaint: hemoptysis Symptoms: blood in phlegm Frequency: x 2 days Pertinent Negatives: Patient denies fever, SOB, CP Disposition: [] ED /[] Urgent Care (no appt availability in office) / [x] Appointment(In office/virtual)/ []  [facility]/ [] Home Care/ [] Refused Recommended Disposition /[] [facility]/ []  Follow-up with PCP Additional Notes: Pt daughter, Burdette Carolin, calling on pt behalf reporting blood in phlegm when coughing. Burdette Carolin reports pt is s/p lung biopsy on 5/27, and started using Incentive Spirometry which triggered pt coughing that evolved to intermittent worsening hemoptysis. Kim also endorsed that pt resumed taking blood thinner yesterday and has concerns if starting CPAP caused hemoptysis as well. Scheduled patient per protocol on Jun 04, 2023 with alternate provider.       Copied from CRM [id number]. Topic: Clinical - Red Word Triage >> Jun 04, 2023  9:44 AM Crist Dominion wrote: Red Word that prompted transfer to Nurse Triage: Coughing up blood with phlegm - patient had a lung biopsy done on 5/27 Reason for Disposition  Taking Coumadin (warfarin) or other strong blood thinner, or known bleeding disorder (e.g., thrombocytopenia)  Answer Assessment - Initial Assessment Questions E2C2 Pulmonary Triage - Initial Assessment Questions "Chief Complaint (e.g., cough, sob, wheezing, fever, chills, sweat or additional symptoms) *Go to specific symptom protocol after initial questions. Recent lung biopsy 5/27 and now intermittently coughing up blood with phlegm s/p using IS Daughter also has concerns with CPAP s/p biopsy  "How long have symptoms been present?" X 2 days  Have you tested for COVID or Flu? Note: If not, ask patient if a home test can be taken. If so, instruct patient to call back for positive results. No  MEDICINES:     If inhaler, ask "How many puffs and how often?" Note: Review instructions on medication in the chart. Albuterol  PRN  OXYGEN: "Do  you wear supplemental oxygen?" No If yes, "How many liters are you supposed to use?" N/a  "Do you monitor your oxygen levels?" Yes If yes, "What is your reading (oxygen level) today?" unknown  "What is your usual oxygen saturation reading?"  (Note: Pulmonary O2 sats should be 90% or greater) unknown    3. SPUTUM: "Describe the color of your sputum" (none, dry cough; clear, white, yellow, green)     Clear with a "pale pink" that progressively got darker 4. HEMOPTYSIS: "How much blood?" (flecks, streaks, tablespoons, etc.)     Yes, Enough to measure 5. DIFFICULTY BREATHING: "Are you having difficulty breathing?" If Yes, ask: "How bad is it?" (e.g., mild, moderate, severe)    - MILD: No SOB at rest, mild SOB with walking, speaks normally in sentences, can lie down, no retractions, pulse < 100.    - MODERATE: SOB at rest, SOB with minimal exertion and prefers to sit, cannot lie down flat, speaks in phrases, mild retractions, audible wheezing, pulse 100-120.    - SEVERE: Very SOB at rest, speaks in single words, struggling to breathe, sitting hunched forward, retractions, pulse > 120      denies 6. FEVER: "Do you have a fever?" If Yes, ask: "What is your temperature, how was it measured, and when did it start?"     denies 7. CARDIAC HISTORY: "Do you have any history of heart disease?" (e.g., heart attack, congestive heart failure)      A fib 8. LUNG HISTORY: "Do you have any history of lung disease?"  (e.g., pulmonary embolus, asthma, emphysema)     asthma 9. PE RISK FACTORS: "Do you have a history of blood clots?" (  or: recent major surgery, recent prolonged travel, bedridden)     Hx of stroke 10. OTHER SYMPTOMS: "Do you have any other symptoms?" (e.g., runny nose, wheezing, chest pain)       denies 11. PREGNANCY: "Is there any chance you are pregnant?" "When was your last menstrual period?"       N/a 12. TRAVEL: "Have you traveled out of the country in the last month?" (e.g., travel  history, exposures)       N/a  Protocols used: Coughing Up Blood-A-AH
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
## 2023-06-04 NOTE — Patient Instructions (Signed)
 VISIT SUMMARY:  During your visit today, we discussed your concerns about possible lung cancer. We reviewed your recent biopsy and imaging results, which confirmed a diagnosis of stage 4 non-small cell lung cancer with metastasis to your ribs and adrenal gland. We also addressed your symptoms of coughing up blood . We talked about the next steps in your treatment plan and the importance of maintaining your quality of life during this time.  YOUR PLAN:  -NON-SMALL CELL LUNG CANCER, STAGE 4: You have been diagnosed with stage 4 non-small cell lung cancer, which means the cancer has spread to other parts of your body, including your ribs and adrenal gland. We will conduct mutation testing on your tissue and blood to help determine the best treatment approach. Your case will be discussed at a tumor board to decide whether chemotherapy with radiation or chemo- immunotherapy is the best option. We will arrange for a chemo port placement at [facility] and refer you to radiation oncology at [facility] in [place].   -COUGHING UP BLOOD: Your intermittent coughing up blood is likely related to your lung cancer. We will hold your Xarelto  for 24 hours to manage this symptom.  -GOALS OF CARE: We discussed the importance of treatment to slow the progression of your cancer and improve your survival. We also talked about maintaining your quality of life and functionality during treatment. You are currently functional and eligible for chemotherapy.  INSTRUCTIONS:  Please schedule a follow-up appointment in two weeks to discuss the results of your mutation testing and your treatment plan. We will also coordinate your care with radiation oncology and other specialists as needed.

## 2023-06-04 NOTE — Telephone Encounter (Signed)
 Per secure chat with Dr. Darnelle Elders- if it's only a few episodes of coughing phlegm mixed with some blood then no need for an acute visit. it is expected   Lm x1 for the patient.

## 2023-06-04 NOTE — Telephone Encounter (Signed)
 Patient was see by Dr. Waymond Hailey today.  Nothing further needed.

## 2023-06-04 NOTE — Assessment & Plan Note (Signed)
 S/p navigational bx 06/02/23 RUL with bldding p restarting xarelto  20 mg pm 5/28 / mostly streaky - no def change on cxr 06/04/2023 > rec hold xarelto  until no fresh blood x 24 h  (or 48 h max per Dr Darnelle Elders)   Advised not to use IS or cpap on anthing else that makes her cough hard for next 69 y and stay off her feet as much as possible with no f/u needed for this problem unless crescendo's as this much bleeding is expected on or off DOAC from TBbx  Discussed in detail all the  indications, usual  risks and alternatives  relative to the benefits with patient who agrees to proceed with Rx as outlined.             Each maintenance medication was reviewed in detail including emphasizing most importantly the difference between maintenance and prns and under what circumstances the prns are to be triggered using an action plan format where appropriate.  Total time for H and P, chart review, counseling, reviewing IS/cpap device(s) and generating customized AVS unique to this acute office visit / same day charting = 30 min with pt new to me

## 2023-06-04 NOTE — Progress Notes (Signed)
 Becky [name], female    DOB: [birth year]    MRN: [id number]   Brief patient profile:  9  yobf  quit smokoing 2005  referred as an ACUTE to pulmonary clinic in [place]  06/04/2023 by Becky [name]  for hemoptysis p TBBX 05/13/23 RUL  by Becky [name]    History of Present Illness  06/04/2023  Pulmonary/ 1st office eval/ Becky [name] / [name] on xarelto  pm 5/28  Chief Complaint  Patient presents with   Hemoptysis    Lung Biopsy 06/02/23  Dyspnea:  baseline/ not limited by doe with adls Cough: streaky hemoptysis  x 3-4x per day / no cp/ worse with use of IS  Sleep: flat bed 2 pillows  holding cpap  for now  SABA use: ventolin  one puff today  02: none Nasal congestion but no espistaxis     No obvious day to day or daytime pattern/variability or assoc excess/ purulent sputum or mucus plugs or cp or chest tightness, subjective wheeze or overt sinus or hb symptoms.    Also denies any obvious fluctuation of symptoms with weather or environmental changes or other aggravating or alleviating factors except as outlined above   No unusual exposure hx or h/o childhood pna/ asthma or knowledge of premature birth.  Current Allergies, Complete Past Medical History, Past Surgical History, Family History, and Social History were reviewed in [employer] record.  ROS  The following are not active complaints unless bolded Hoarseness, sore throat, dysphagia, dental problems, itching, sneezing,  nasal congestion or discharge of excess mucus or purulent secretions, ear ache,   fever, chills, sweats, unintended wt loss or wt gain, classically pleuritic or exertional cp,  orthopnea pnd or arm/hand swelling  or leg swelling, presyncope, palpitations, abdominal pain, anorexia, nausea, vomiting, diarrhea  or change in bowel habits or change in bladder habits, change in stools or change in urine, dysuria, hematuria,  rash, arthralgias, visual complaints, headache, numbness, weakness or  ataxia or problems with walking or coordination,  change in mood or  memory.            Outpatient Medications Prior to Visit  Medication Sig Dispense Refill   acetaminophen  (TYLENOL ) 500 MG tablet Take 1,000 mg by mouth every 6 (six) hours as needed for mild pain (pain score 1-3).     albuterol  (VENTOLIN  HFA) 108 (90 Base) MCG/ACT inhaler Inhale 2 puffs into the lungs every 4 (four) hours as needed for wheezing or shortness of breath. 18 g 0   cefdinir (OMNICEF) 300 MG capsule Take 300 mg by mouth 2 (two) times daily.     Cholecalciferol  (VITAMIN D -3 PO) Take 1 tablet by mouth 3 (three) times a week.     DEXILANT  60 MG capsule Take 1 capsule (60 mg total) by mouth daily. 90 capsule 3   dexlansoprazole  (DEXILANT ) 60 MG capsule Take 1 capsule (60 mg total) by mouth daily. 90 capsule 3   diltiazem  (TIAZAC ) 360 MG 24 hr capsule Take 360 mg by mouth daily.     EPINEPHrine  0.3 mg/0.3 mL IJ SOAJ injection Inject 0.3 mg into the muscle once as needed for anaphylaxis.     glimepiride (AMARYL) 2 MG tablet Take 2 mg by mouth daily with breakfast.     KLOR-CON  M20 20 MEQ tablet Take 20 mEq by mouth daily. Becky [name] takes as needed when she has cramps     linaclotide  (LINZESS ) 72 MCG capsule Take 1 capsule (72 mcg total) by mouth daily before breakfast. 90  capsule 3   montelukast  (SINGULAIR ) 10 MG tablet Take 10 mg by mouth at bedtime.     NON FORMULARY Pt uses a cpap nightly     olmesartan  (BENICAR ) 40 MG tablet Take 1 tablet (40 mg total) by mouth daily. (Patient taking differently: Take 20 mg by mouth daily.) 90 tablet 3   polyethylene glycol (MIRALAX ) 17 g packet Take 17 g by mouth daily. 30 each 1   RESTASIS 0.05 % ophthalmic emulsion Place 1 drop into both eyes 2 (two) times daily.     rivaroxaban  (XARELTO ) 20 MG TABS tablet Take 1 tablet (20 mg total) by mouth daily. 90 tablet 1   rosuvastatin  (CRESTOR ) 10 MG tablet TAKE 1 TABLET BY MOUTH EVERY DAY 90 tablet 1   spironolactone  (ALDACTONE ) 25 MG tablet  TAKE 1 TABLET BY MOUTH EVERY DAY 90 tablet 1   SYNTHROID  25 MCG tablet Take 25 mcg by mouth daily.     pantoprazole  (PROTONIX ) 40 MG tablet TAKE 1 TABLET (40 MG TOTAL) BY MOUTH DAILY TAKE 30 MINUTES BEFORE BREAKFAST (Patient taking differently: Take 40 mg by mouth daily as needed (heartburn). Take 30 minutes before breakfast) 30 tablet 0   No facility-administered medications prior to visit.    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 diagnosed in 11/2016 --> started on Xarelto  for anticoagulation   Pelvic pain in female 11/02/2013   Plantar fasciitis of right foot    Pre-diabetes    Sleep apnea    dont use cpap says causes sinus infection      Objective:     BP (!) 142/84 (BP Location: Left Arm)   Pulse 73   Ht 5\' 7"  (1.702 m)   Wt 254 lb 12.8 oz (115.6 kg)   SpO2 96% Comment: RA  BMI 39.91 kg/m   SpO2: 96 % (RA)  Distant bs /no wheeze   Amb Mod obese (by BMI ) pleasant bf nad    HEENT : Oropharynx  clear      Nasal turbinates nl    NECK :  without  apparent JVD/ palpable Nodes/TM    LUNGS: no acc muscle use,  Nl contour chest which is clear to A and P bilaterally without cough on insp or exp maneuvers   CV:  RRR  no s3 or murmur or increase in P2, and no edema   ABD:  soft and nontender    MS:  Gait nl   ext warm without deformities Or obvious joint restrictions  calf tenderness, cyanosis or clubbing    SKIN: warm and dry without lesions    NEURO:  alert, approp, nl sensorium with  no motor or cerebellar deficits apparent.      CXR  PA and Lateral:   06/04/2023 :    I personally reviewed images and impression is as follows:    Relatively small lung volumes/ no change RUL mass  or alv opacities to suggest active bleeding   Assessment   Hemoptysis S/p navigational bx 06/02/23 RUL with bldding p restarting xarelto  20 mg pm 5/28 / mostly streaky - no def change on cxr 06/04/2023 > rec hold xarelto  until no fresh blood x 24 h  (or 48 h max per Becky [name])   Advised not to use IS or cpap on anthing else that makes her cough hard for next 54 y and stay off her feet as much as possible with no f/u needed for this problem unless crescendo's as this much bleeding is expected on or off DOAC from TBbx  Discussed in detail all the  indications, usual  risks and alternatives  relative to the benefits with patient who agrees to proceed with Rx as outlined.             Each maintenance medication was reviewed in detail including emphasizing most importantly the difference between maintenance and prns and under what circumstances the prns are to be triggered using an action plan format where appropriate.  Total time for H and P, chart review, counseling, reviewing IS/cpap device(s) and generating customized AVS unique to this acute office visit / same day charting = 30 min with pt new to me           Vernestine Gondola, MD 06/04/2023

## 2023-06-05 ENCOUNTER — Ambulatory Visit (HOSPITAL_COMMUNITY)

## 2023-06-05 ENCOUNTER — Telehealth: Payer: Self-pay | Admitting: Internal Medicine

## 2023-06-05 NOTE — Telephone Encounter (Signed)
 Per chart no documentation of phone call to patient from pulmonary. Called patient, said she spoke to Dr. Waymond Hailey. Nothing further needed

## 2023-06-05 NOTE — Telephone Encounter (Signed)
 Copied from CRM [id number]. Topic: General - Call Back - No Documentation >> Jun 05, 2023  9:40 AM Tyronne Galloway wrote: Reason for CRM: Pt called back after missing a call. Pt does not know what the call was for. I called [facility] and was told to put back a CRM. Pt stated before the call ended that it may have been Dr. Waymond Hailey calling her for instructions going forward after coughing up blood before being seen. Pt's phone number is [phone].

## 2023-06-08 ENCOUNTER — Inpatient Hospital Stay: Payer: Self-pay | Attending: Oncology

## 2023-06-08 ENCOUNTER — Encounter (HOSPITAL_COMMUNITY): Payer: Self-pay

## 2023-06-08 ENCOUNTER — Ambulatory Visit (HOSPITAL_COMMUNITY): Attending: Internal Medicine

## 2023-06-08 DIAGNOSIS — I10 Essential (primary) hypertension: Secondary | ICD-10-CM | POA: Insufficient documentation

## 2023-06-08 DIAGNOSIS — C7951 Secondary malignant neoplasm of bone: Secondary | ICD-10-CM | POA: Insufficient documentation

## 2023-06-08 DIAGNOSIS — Z8673 Personal history of transient ischemic attack (TIA), and cerebral infarction without residual deficits: Secondary | ICD-10-CM | POA: Insufficient documentation

## 2023-06-08 DIAGNOSIS — Z7409 Other reduced mobility: Secondary | ICD-10-CM | POA: Insufficient documentation

## 2023-06-08 DIAGNOSIS — C7972 Secondary malignant neoplasm of left adrenal gland: Secondary | ICD-10-CM | POA: Insufficient documentation

## 2023-06-08 DIAGNOSIS — I2510 Atherosclerotic heart disease of native coronary artery without angina pectoris: Secondary | ICD-10-CM | POA: Insufficient documentation

## 2023-06-08 DIAGNOSIS — C3411 Malignant neoplasm of upper lobe, right bronchus or lung: Secondary | ICD-10-CM | POA: Insufficient documentation

## 2023-06-08 DIAGNOSIS — R29898 Other symptoms and signs involving the musculoskeletal system: Secondary | ICD-10-CM | POA: Diagnosis present

## 2023-06-08 DIAGNOSIS — E119 Type 2 diabetes mellitus without complications: Secondary | ICD-10-CM | POA: Insufficient documentation
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
## 2023-06-09 ENCOUNTER — Other Ambulatory Visit (HOSPITAL_COMMUNITY): Payer: Self-pay | Admitting: Radiology

## 2023-06-09 DIAGNOSIS — C7972 Secondary malignant neoplasm of left adrenal gland: Secondary | ICD-10-CM

## 2023-06-10 ENCOUNTER — Ambulatory Visit (HOSPITAL_COMMUNITY)
Admission: RE | Admit: 2023-06-10 | Discharge: 2023-06-10 | Disposition: A | Discharge status: Home / Self Care | Payer: Self-pay | Source: Ambulatory Visit | Attending: Oncology | Admitting: Oncology

## 2023-06-10 ENCOUNTER — Ambulatory Visit: Payer: Self-pay | Admitting: Internal Medicine

## 2023-06-11 ENCOUNTER — Ambulatory Visit (HOSPITAL_COMMUNITY)

## 2023-06-11 ENCOUNTER — Telehealth: Payer: Self-pay | Admitting: Family Medicine

## 2023-06-11 ENCOUNTER — Encounter (HOSPITAL_COMMUNITY): Payer: Self-pay

## 2023-06-11 ENCOUNTER — Other Ambulatory Visit: Payer: Self-pay

## 2023-06-11 ENCOUNTER — Telehealth (HOSPITAL_COMMUNITY): Payer: Self-pay | Admitting: Oncology

## 2023-06-11 DIAGNOSIS — G4733 Obstructive sleep apnea (adult) (pediatric): Secondary | ICD-10-CM

## 2023-06-11 NOTE — Telephone Encounter (Signed)
 Pt called in regards to needing a new CPAP  machine . Pt states that CPAP machine is  not working  anymore. Pt reached out to Manufacturing company([employer] ) to tell them  what was going on ,  [employer] inform pt to call  and let Provider know . Pt is unsure what to do next . Pt states Insurance might not pay for machines and she is not sure how much  Machine will cost out of pocket .

## 2023-06-11 NOTE — Telephone Encounter (Signed)
 Patient informed with below, pt states she has new insurance and is not sure if this office is in network. Pt machine is still working for her to use. She will check and let us  know.

## 2023-06-11 NOTE — Addendum Note (Signed)
 Addended by: Terrilyn Fick L on: 06/11/2023 02:18 PM   Modules accepted: Orders

## 2023-06-11 NOTE — Telephone Encounter (Signed)
 I called to speak with patient about the below message from phone room.Pt said her machine cuts off at night sometimes,she is still able to use machine. Her set up date was 06/21/1998 and will be due for a new machine 06/21/2023. Pt said she called [employer] and they told her to see if provider will give a order for new machine now as it will take time with insurance. [employer] told her they did notice some leaks on compliance report. I suggested she wait until appointment with Dr.Athar scheduled on 07/01/23. But pt asked to still check with you.   I have attached reports also.

## 2023-06-11 NOTE — Telephone Encounter (Signed)
 Pt was scheduled for a port placement on 6/4. She missed her appointment and I called to reschedule her. She is a bit confused about when she needs to have this port placed. She thinks that she is supposed to see Dr. [name] again before she gets the port. Dr. [name] will call the patient to explain to her that she needs the port now and I will call her back to set this back up after they speak. JM

## 2023-06-11 NOTE — Therapy (Signed)
 [facility] [place] [address] [place], [place], [address] Phone: [phone]   Fax:  [phone]  Patient Details  Name: Becky [name] MRN: [id number] Date of Birth: 1960/08/27 Referring Provider:  No ref. provider found  Encounter Date: 06/11/2023  Pt was called concerning her missed appointment this afternoon. Pt states she is sorry just had too much going on with other doctor appointments. Pt states she will be here at the next appointment on Monday.   Armond Bertin, PT, DPT [facility] Office: [phone] 2:49 PM, 06/11/23   [facility] at [facility] [address] [place], [place], [address] Phone: [phone]   Fax:  [phone]

## 2023-06-11 NOTE — Progress Notes (Signed)
 The proposed treatment discussed in conference is for discussion purpose only and is not a binding recommendation.  The patients have not been physically examined, or presented with their treatment options.  Therefore, final treatment plans cannot be decided.

## 2023-06-11 NOTE — Telephone Encounter (Signed)
 Am: you can go ahead and order a home sleep test for reevaluation of her OSA and to qualify for a new machine.  Or if she would like to wait till her appointment to discuss getting a new machine then and ordering a home sleep test at the visit, this is also an option for her.

## 2023-06-15 ENCOUNTER — Encounter: Payer: BC Managed Care – PPO | Attending: Internal Medicine | Admitting: Nutrition

## 2023-06-15 ENCOUNTER — Ambulatory Visit (HOSPITAL_COMMUNITY)

## 2023-06-15 VITALS — Ht 67.0 in | Wt 250.0 lb

## 2023-06-15 DIAGNOSIS — Z7409 Other reduced mobility: Secondary | ICD-10-CM | POA: Diagnosis not present

## 2023-06-15 DIAGNOSIS — E782 Mixed hyperlipidemia: Secondary | ICD-10-CM | POA: Diagnosis present

## 2023-06-15 DIAGNOSIS — E118 Type 2 diabetes mellitus with unspecified complications: Secondary | ICD-10-CM | POA: Diagnosis present

## 2023-06-15 DIAGNOSIS — I2510 Atherosclerotic heart disease of native coronary artery without angina pectoris: Secondary | ICD-10-CM | POA: Diagnosis present

## 2023-06-15 DIAGNOSIS — I10 Essential (primary) hypertension: Secondary | ICD-10-CM | POA: Insufficient documentation

## 2023-06-15 DIAGNOSIS — R29898 Other symptoms and signs involving the musculoskeletal system: Secondary | ICD-10-CM

## 2023-06-15 DIAGNOSIS — I2583 Coronary atherosclerosis due to lipid rich plaque: Secondary | ICD-10-CM | POA: Diagnosis present

## 2023-06-15 NOTE — Progress Notes (Unsigned)
 Medical Nutrition Therapy 1115  1145  Primary concerns today: DM Type 2, Obesity  Referral diagnosis: E11.8, E66.01 Preferred learning style: No preference  Learning readiness: Ready   NUTRITION ASSESSMENT  Follow up   Testing blood sugars 3 times Glucose 153 mg/dl before. A1C 5.9%.  Glimiperode   Occassionally checks blood sugars in am. 109-120's and night time in 130-135 mg/dl. Has been working on eating better food choices but still drinks sodas every now and then. No Recent A1C yet. Goes back to PCP in March 2025. Changing insurance and doesn't know what meter and strips will be covered in 2025. Taking Glimepiride 2 mg a day Gained back 5 lbs over the holidays. She thinks it's more fluid weight due to in fluctuating 3-5 lbs every few days. Trying to eat more vegetables and drinking more water .   PCP Dr. Lewayne Records office. She is willing to commit to Lifestyle Medicine to focus on more of a whole plant based diet and healthier lifestyle.  Anthropometrics  Wt Readings from Last 3 Encounters:  06/04/23 254 lb 12.8 oz (115.6 kg)  06/04/23 257 lb (116.6 kg)  06/02/23 250 lb 9.6 oz (113.7 kg)   Ht Readings from Last 3 Encounters:  06/04/23 5\' 7"  (1.702 m)  06/04/23 5\' 7"  (1.702 m)  06/02/23 5\' 7"  (1.702 m)   There is no height or weight on file to calculate BMI. @BMIFA @ Facility age limit for growth %iles is 20 years. Facility age limit for growth %iles is 20 years.   Clinical Medical Hx: HTN, Afib, CAD, DM Type 2, GERD,Hypothyroidism, Osteoarthritis. Hyperlipidemia. Medications: See chart Labs:     Latest Ref Rng & Units 06/04/2023   11:29 AM 04/14/2023    2:43 AM 04/10/2023    4:50 AM  CMP  Glucose 70 - 99 mg/dL 90  595  638   BUN 8 - 23 mg/dL 22  18  9    Creatinine 0.44 - 1.00 mg/dL 7.56  4.33  2.95   Sodium 135 - 145 mmol/L 138  141  140   Potassium 3.5 - 5.1 mmol/L 4.3  3.7  4.1   Chloride 98 - 111 mmol/L 107  110  108   CO2 22 - 32 mmol/L 22  23  25    Calcium  8.9  - 10.3 mg/dL 18.8  9.4  9.6   Total Protein 6.5 - 8.1 g/dL 7.3     Total Bilirubin 0.0 - 1.2 mg/dL 0.7     Alkaline Phos 38 - 126 U/L 76     AST 15 - 41 U/L 15     ALT 0 - 44 U/L 16      Lipid Panel     Component Value Date/Time   CHOL 160 04/08/2023 0455   CHOL 231 (H) 01/01/2017 1139   TRIG 98 04/08/2023 0455   HDL 47 04/08/2023 0455   HDL 57 01/01/2017 1139   CHOLHDL 3.4 04/08/2023 0455   VLDL 20 04/08/2023 0455   LDLCALC 93 04/08/2023 0455   LDLCALC 154 (H) 01/01/2017 1139   LABVLDL 20 01/01/2017 1139    Notable Signs/Symptoms: None  Lifestyle & Dietary Hx Lives with her husband. She cooks some and they eat out or get take out often.  Estimated daily fluid intake: 30 oz Supplements:  Sleep: poor Stress / self-care:  Current average weekly physical activity: ADL  Painful to walk and exercise due to knee issues.  24-Hr Dietary Recall B) Cornflakes with whole milk L) Baked chicken, green beans,  creamed potatoes, water  D) Toss salad with tomatoes, cukes,  water  Estimated Energy Needs Calories: 1200 Carbohydrate: 135g Protein: 90g Fat: 33g   NUTRITION DIAGNOSIS  NI-1.7 Predicted excessive energy intake As related to Morbid Obesity and Type 2 DM.  As evidenced by A1C > 6.5% and BMI 41.Aaron Aas   NUTRITION INTERVENTION  Nutrition education (E-1) on the following topics:  Nutrition and Diabetes education provided on My Plate, CHO counting, meal planning, portion sizes, timing of meals, avoiding snacks between meals unless having a low blood sugar, target ranges for A1C and blood sugars, signs/symptoms and treatment of hyper/hypoglycemia, monitoring blood sugars, taking medications as prescribed, benefits of exercising 30 minutes per day and prevention of complications of DM.  Lifestyle Medicine  - Whole Food, Plant Predominant Nutrition is highly recommended: Eat Plenty of vegetables, Mushrooms, fruits, Legumes, Whole Grains, Nuts, seeds in lieu of processed meats,  processed snacks/pastries red meat, poultry, eggs.    -It is better to avoid simple carbohydrates including: Cakes, Sweet Desserts, Ice Cream, Soda (diet and regular), Sweet Tea, Candies, Chips, Cookies, Store Bought Juices, Alcohol in Excess of  1-2 drinks a day, Lemonade,  Artificial Sweeteners, Doughnuts, Coffee Creamers, "Sugar-free" Products, etc, etc.  This is not a complete list.....  Exercise: If you are able: 30 -60 minutes a day ,4 days a week, or 150 minutes a week.  The longer the better.  Combine stretch, strength, and aerobic activities.  If you were told in the past that you have high risk for cardiovascular diseases, you may seek evaluation by your heart doctor prior to initiating moderate to intense exercise programs.   Handouts Provided Include  LIfestyle Medicine Diabetes Handouts.  Learning Style & Readiness for Change Teaching method utilized: Visual & Auditory  Demonstrated degree of understanding via: Teach Back  Barriers to learning/adherence to lifestyle change: none  Goals Established by Pt  Goals Eat 2 vegetables with lunch and dinner daily Cut out sodas 100% Walk 15 minutes twice a day or more as tolerated. Cut out processed foods and fast foods Call heart MD if your weight goes up more than 3 lbs over night or 5 lbs within the week. Weigh daily Avoid salt and salty processed foods- don't use fat back, salt pork or salt seasonings. Get A1C to 6.5% or below.  MONITORING & EVALUATION Dietary intake, weekly physical activity, and blood sugars and weight  in 3-4 month.  Next Steps  Patient is to work on  eating better balanced meals.Aaron Aas
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
## 2023-06-16 ENCOUNTER — Ambulatory Visit: Attending: Nurse Practitioner

## 2023-06-16 DIAGNOSIS — I739 Peripheral vascular disease, unspecified: Secondary | ICD-10-CM

## 2023-06-17 ENCOUNTER — Telehealth: Payer: Self-pay | Admitting: Family Medicine

## 2023-06-17 DIAGNOSIS — G4733 Obstructive sleep apnea (adult) (pediatric): Secondary | ICD-10-CM

## 2023-06-17 NOTE — Telephone Encounter (Signed)
 HST Oscar pending.

## 2023-06-18 ENCOUNTER — Encounter (HOSPITAL_COMMUNITY): Payer: Self-pay

## 2023-06-18 ENCOUNTER — Inpatient Hospital Stay: Payer: Self-pay | Admitting: Oncology

## 2023-06-18 ENCOUNTER — Ambulatory Visit (HOSPITAL_COMMUNITY)

## 2023-06-18 VITALS — BP 130/71 | HR 90 | Temp 97.9°F | Resp 18 | Wt 249.0 lb

## 2023-06-18 DIAGNOSIS — E538 Deficiency of other specified B group vitamins: Secondary | ICD-10-CM

## 2023-06-18 DIAGNOSIS — C7951 Secondary malignant neoplasm of bone: Secondary | ICD-10-CM | POA: Diagnosis not present

## 2023-06-18 DIAGNOSIS — Z8673 Personal history of transient ischemic attack (TIA), and cerebral infarction without residual deficits: Secondary | ICD-10-CM | POA: Diagnosis not present

## 2023-06-18 DIAGNOSIS — C7972 Secondary malignant neoplasm of left adrenal gland: Secondary | ICD-10-CM | POA: Diagnosis not present

## 2023-06-18 DIAGNOSIS — C3491 Malignant neoplasm of unspecified part of right bronchus or lung: Secondary | ICD-10-CM

## 2023-06-18 DIAGNOSIS — I2510 Atherosclerotic heart disease of native coronary artery without angina pectoris: Secondary | ICD-10-CM | POA: Diagnosis not present

## 2023-06-18 DIAGNOSIS — I10 Essential (primary) hypertension: Secondary | ICD-10-CM | POA: Diagnosis not present

## 2023-06-18 DIAGNOSIS — E119 Type 2 diabetes mellitus without complications: Secondary | ICD-10-CM | POA: Diagnosis not present

## 2023-06-18 DIAGNOSIS — C3411 Malignant neoplasm of upper lobe, right bronchus or lung: Secondary | ICD-10-CM | POA: Diagnosis present

## 2023-06-18 MED ORDER — FOLIC ACID 1 MG PO TABS: 1.0000 mg | ORAL_TABLET | Freq: Every day | ORAL | 2 refills | Status: DC

## 2023-06-18 NOTE — Addendum Note (Signed)
 Addended by: Harlo Fabela on: 06/18/2023 03:41 PM   Modules accepted: Orders

## 2023-06-18 NOTE — Assessment & Plan Note (Signed)
 Patient has mild folate deficiency  - Start folic acid  1 mg daily

## 2023-06-18 NOTE — Progress Notes (Signed)
 Patient Care Team: [name], MD as PCP - General (Internal Medicine) Amanda Jungling Joyceann No, MD as PCP - Cardiology (Cardiology) Boyce Byes, MD as PCP - Electrophysiology (Cardiology) Lorenso Romance, PTA as Physical Therapy Assistant (Physical Therapy) Vinetta Greening, DO as Consulting Physician (Internal Medicine) Ethelene Herald, RN as [facility] Ethelene Herald, RN  Clinic Day:  06/18/2023  Referring physician: [name], MD   CHIEF COMPLAINT:  CC: Metastatic non-small cell lung carcinoma  ASSESSMENT & PLAN:   Assessment & Plan: Becky [name]  is a 63 y.o. female with metastatic non-small cell lung carcinoma  Non-small cell carcinoma of lung (HCC) Patient with stage IV non-small cell lung cancer with metastasis to ribs and left adrenal gland confirmed by PET scan Patient is of good functional status, former smoker who quit smoking 27 years ago. MRI brain 04/25: Negative for intra cranial metastasis [email or web address]: No actionable mutation, Caris: Pending Discussed at TB: Consensus to treat as limited stage disease with chemo RT to the lung and palliative radiation to rib and adrenal  - Refer to Rad/onc for chemo RT.  Patient has an appointment on 06/24/2023 in [place]  - Will have port placed for chemotherapy - Transition care to Dr. Marguerita Shih for convenience to get chemotherapy while getting radiation in [place] - Discussed with patient that the plan to include chemo RT to the lung and palliative radiation to the rib lesion with or without including the adrenal.  - Discussed with patient that plan is to do chemo RT as per PACIFIC trial followed by consolidation durvalumab if she has good response to chemo RT  -The PACIFIC trial demonstrated that consolidation therapy with durvalumab after chemoradiotherapy in unresectable stage III non-small-cell lung cancer (NSCLC) significantly improved both progression-free survival (PFS) and overall survival  (OS). At 5 years, the estimated OS rate was 42.9% for the durvalumab group compared to 33.4% for the placebo group, and the estimated PFS rate was 33.1% versus 19.0%, respectively.  - Discussed most [name] expected side effects with chemoradiotherapy with carboplatin and paclitaxel commonly causes hematologic toxicities, including grade 3/4 neutropenia, anemia, and thrombocytopenia, with neutropenic fever occurring in a minority of cases. Non-hematologic side effects may include nausea, vomiting, fatigue, and peripheral neuropathy, which can be dose-limiting. Serious adverse events are rare but may involve allergic reactions, renal toxicity, or mucositis.  Recommended patient to reach out back to us  after completing chemoRT for transition of of care back to [facility]   Folate deficiency Patient has mild folate deficiency  - Start folic acid  1 mg daily    The patient understands the plans discussed today and is in agreement with them.  She knows to contact our office if she develops concerns prior to her next appointment.  I provided 30 minutes of face-to-face time during this encounter and > 50% was spent counseling as documented under my assessment and plan.    [name], MD  [facility]  - A DEPT OF [facility] [address]  [place] [address] Dept: [phone] Dept Fax: [phone]   No orders of the defined types were placed in this encounter.    ONCOLOGY HISTORY:   Oncology History  Lung mass  02/11/2023 Imaging   Chest Xray:  IMPRESSION: Similar-appearing right upper lobe airspace opacity. Recommend CT chest with intravenous contrast for further evaluation (not CT PA)   04/07/2023 Imaging   MR brain without contrast:  IMPRESSION: 1.  Positive for multiple small and widely scattered scattered acute or subacute infarcts in both cerebral hemispheres. No associated hemorrhage or mass effect. The  pattern is suspicious for recent embolic event. 2. Underlying chronic white matter signal changes which are nonspecific but due to small vessel disease, including a solitary chronic microhemorrhage in the left hemisphere. 3. Intracranial MRA today is reported separately   05/14/2023 Imaging   CT chest W contrast:  IMPRESSION: Spiculated solid-appearing mass with ground-glass and bronchial wall thickening in the posterior right upper lobe abutting the fissure with retraction. Appearance is worrisome for intrinsic lung lesion or neoplasm and recommend further evaluation. There is a bronchus extends into the lesion best seen on coronal imaging. PET-CT scan may be of some use.   05/21/2023 PET scan   IMPRESSION:  Right upper lobe mass is hypermetabolic worrisome for lung neoplasm. There are additional abnormal hypermetabolic nodes identified in the right lung hilum as well as right side subcarinal in the mediastinum.   The small left adrenal nodule seen previous adjacent to the separate left adrenal adenoma does show significant abnormal uptake for size and worrisome for an adrenal metastasis.   There is also a area of irregularity and thickening along the anterior aspect of the right sixth rib with significant abnormal uptake. Worrisome for an isolated bony metastasis.   05/27/2023 Initial Diagnosis   Lung mass   06/02/2023 Pathology Results   1. Lung, biopsy, right upper lobe mass :       - NON-SMALL CELL CARCINOMA, FAVOR ADENOCARCINOMA.        Diagnosis Note : Per [facility] the patient has a hypermetabolic right upper lobe lung       mass. The neoplastic cells are positive for TTF-1 and negative for p40.  This       pattern of immunoreactivity supports the above diagnosis.    06/10/2023 Pathology Results   Guardant360: Detected alterations: BR 1P1 splice site SNV, NF1 K2456, NF1 R461, T p53, MSH 2 TMB: 14.24 mutations/Mb, MSI high: Not detected ALK, BRAF, EGFR, ERV B2, KRAS, met, NRG 1, NTRK,  RET, ROS1: Negative  Caris: Pending   Non-small cell carcinoma of lung (HCC)  06/04/2023 Initial Diagnosis   Non-small cell carcinoma of lung (HCC)   06/04/2023 Cancer Staging   Staging form: Lung, AJCC V9 - Clinical stage from 06/04/2023: Stage IVB Becky [name], cNX, cM1c2) - Signed by [name], MD on 06/04/2023 Stage prefix: Initial diagnosis   06/10/2023 Pathology Results   [email or web address]: Detected alterations: BR 1P1 splice site SNV, NF1 K2456, NF1 R461, T p53, MSH 2 TMB: 14.24 mutations/Mb, MSI high: Not detected ALK, BRAF, EGFR, ERV B2, KRAS, met, NRG 1, NTRK, RET, ROS1: Negative  Caris: Pending       Current Treatment: Plan for chemo RT with carboplatin and paclitaxel followed by consolidation durvalumab  INTERVAL HISTORY:  Becky [name] is here today for follow up. Patient is accompanied by her 2 daughters and son today.  She has a past medical history of CVA on Xarelto , OSA, CAD, asthma, hypertension and diabetes mellitus.  She has no complaints today and is excited about her birthday tomorrow.  She reported that her rib pain has vanished after bronchoscopy.  We discussed that consensus was to treat her with chemoradiation and palliative radiation to metastatic sites.  For convenience of chemo RT we would like to transition her care to [facility] for chemotherapy and patient is in agreement with that.  She is willing to have a port placed  and will follow-up with radiation oncology on the 18th.   I have reviewed the past medical history, past surgical history, social history and family history with the patient and they are unchanged from previous note.  ALLERGIES:  is allergic to apresoline  [hydralazine ], latex, other, and prednisone .  MEDICATIONS:  Current Outpatient Medications  Medication Sig Dispense Refill   acetaminophen  (TYLENOL ) 500 MG tablet Take 1,000 mg by mouth every 6 (six) hours as needed for mild pain (pain score 1-3).     albuterol  (VENTOLIN  HFA) 108 (90  Base) MCG/ACT inhaler Inhale 2 puffs into the lungs every 4 (four) hours as needed for wheezing or shortness of breath. 18 g 0   cefdinir (OMNICEF) 300 MG capsule Take 300 mg by mouth 2 (two) times daily.     Cholecalciferol  (VITAMIN D -3 PO) Take 1 tablet by mouth 3 (three) times a week.     DEXILANT  60 MG capsule Take 1 capsule (60 mg total) by mouth daily. 90 capsule 3   dexlansoprazole  (DEXILANT ) 60 MG capsule Take 1 capsule (60 mg total) by mouth daily. 90 capsule 3   diltiazem  (TIAZAC ) 360 MG 24 hr capsule Take 360 mg by mouth daily.     EPINEPHrine  0.3 mg/0.3 mL IJ SOAJ injection Inject 0.3 mg into the muscle once as needed for anaphylaxis.     folic acid  (FOLVITE ) 1 MG tablet Take 1 tablet (1 mg total) by mouth daily. 90 tablet 2   glimepiride (AMARYL) 2 MG tablet Take 2 mg by mouth daily with breakfast. (Patient taking differently: Take 1 mg by mouth daily with breakfast.)     KLOR-CON  M20 20 MEQ tablet Take 20 mEq by mouth daily. [name] takes as needed when she has cramps     linaclotide  (LINZESS ) 72 MCG capsule Take 1 capsule (72 mcg total) by mouth daily before breakfast. 90 capsule 3   montelukast  (SINGULAIR ) 10 MG tablet Take 10 mg by mouth at bedtime.     NON FORMULARY Pt uses a cpap nightly     olmesartan  (BENICAR ) 40 MG tablet Take 1 tablet (40 mg total) by mouth daily. (Patient taking differently: Take 20 mg by mouth daily.) 90 tablet 3   polyethylene glycol (MIRALAX ) 17 g packet Take 17 g by mouth daily. 30 each 1   RESTASIS 0.05 % ophthalmic emulsion Place 1 drop into both eyes 2 (two) times daily.     rivaroxaban  (XARELTO ) 20 MG TABS tablet Take 1 tablet (20 mg total) by mouth daily. 90 tablet 1   rosuvastatin  (CRESTOR ) 10 MG tablet TAKE 1 TABLET BY MOUTH EVERY DAY 90 tablet 1   spironolactone  (ALDACTONE ) 25 MG tablet TAKE 1 TABLET BY MOUTH EVERY DAY 90 tablet 1   SYNTHROID  25 MCG tablet Take 25 mcg by mouth daily.     No current facility-administered medications for this visit.      REVIEW OF SYSTEMS:   Constitutional: Denies fevers, chills or abnormal weight loss Eyes: Denies blurriness of vision Ears, nose, mouth, throat, and face: Denies mucositis or sore throat Respiratory: Denies cough, dyspnea or wheezes Cardiovascular: Denies palpitation, chest discomfort or lower extremity swelling Gastrointestinal:  Denies nausea, heartburn or change in bowel habits Skin: Denies abnormal skin rashes Lymphatics: Denies new lymphadenopathy or easy bruising Neurological:Denies numbness, tingling or new weaknesses Behavioral/Psych: Mood is stable, no new changes  All other systems were reviewed with the patient and are negative.   VITALS:  Blood pressure 130/71, pulse 90, temperature 97.9 F (36.6 C), temperature  source Oral, resp. rate 18, weight 249 lb (112.9 kg), SpO2 96%.  Wt Readings from Last 3 Encounters:  06/18/23 249 lb (112.9 kg)  06/15/23 250 lb (113.4 kg)  06/04/23 254 lb 12.8 oz (115.6 kg)    Body mass index is 39 kg/m.  Performance status (ECOG): 1 - Symptomatic but completely ambulatory  PHYSICAL EXAM:   GENERAL:alert, no distress and comfortable SKIN: skin color, texture, turgor are normal, no rashes or significant lesions LUNGS: clear to auscultation and percussion with normal breathing effort HEART: regular rate & rhythm and no murmurs and no lower extremity edema ABDOMEN:abdomen soft, non-tender and normal bowel sounds Musculoskeletal:no cyanosis of digits and no clubbing  NEURO: alert & oriented x 3 with fluent speech  LABORATORY DATA:  I have reviewed the data as listed    Component Value Date/Time   NA 138 06/04/2023 1129   NA 143 03/12/2021 1319   K 4.3 06/04/2023 1129   CL 107 06/04/2023 1129   CO2 22 06/04/2023 1129   GLUCOSE 90 06/04/2023 1129   BUN 22 06/04/2023 1129   BUN 14 03/12/2021 1319   CREATININE 1.16 (H) 06/04/2023 1129   CREATININE 0.77 08/01/2015 1229   CALCIUM  10.0 06/04/2023 1129   PROT 7.3 06/04/2023 1129    PROT 6.5 01/01/2017 1139   ALBUMIN 4.4 06/04/2023 1129   ALBUMIN 4.4 01/01/2017 1139   AST 15 06/04/2023 1129   ALT 16 06/04/2023 1129   ALKPHOS 76 06/04/2023 1129   BILITOT 0.7 06/04/2023 1129   BILITOT 0.5 01/01/2017 1139   GFRNONAA 54 (L) 06/04/2023 1129   GFRNONAA 82 02/12/2015 1344   GFRAA 68 07/18/2019 1105   GFRAA >89 02/12/2015 1344    Lab Results  Component Value Date   WBC 13.6 (H) 06/04/2023   NEUTROABS 9.8 (H) 06/04/2023   HGB 12.2 06/04/2023   HCT 36.6 06/04/2023   MCV 92.4 06/04/2023   PLT 271 06/04/2023      Chemistry      Component Value Date/Time   NA 138 06/04/2023 1129   NA 143 03/12/2021 1319   K 4.3 06/04/2023 1129   CL 107 06/04/2023 1129   CO2 22 06/04/2023 1129   BUN 22 06/04/2023 1129   BUN 14 03/12/2021 1319   CREATININE 1.16 (H) 06/04/2023 1129   CREATININE 0.77 08/01/2015 1229      Component Value Date/Time   CALCIUM  10.0 06/04/2023 1129   ALKPHOS 76 06/04/2023 1129   AST 15 06/04/2023 1129   ALT 16 06/04/2023 1129   BILITOT 0.7 06/04/2023 1129   BILITOT 0.5 01/01/2017 1139       RADIOGRAPHIC STUDIES: I have personally reviewed the radiological images as listed and agreed with the findings in the report.  None new to review

## 2023-06-18 NOTE — Assessment & Plan Note (Addendum)
 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 MRI brain 04/25: Negative for intra cranial metastasis Guardant360: No actionable mutation, Caris: Pending Discussed at TB: Consensus to treat as limited stage disease with chemo RT to the lung and palliative radiation to rib and adrenal  - Refer to Rad/onc for chemo RT.  Patient has an appointment on 06/24/2023 in [place]  - Will have port placed for chemotherapy - Transition care to Dr. Marguerita Shih for convenience to get chemotherapy while getting radiation in [place] - Discussed with patient that the plan to include chemo RT to the lung and palliative radiation to the rib lesion with or without including the adrenal.  - Discussed with patient that plan is to do chemo RT as per PACIFIC trial followed by consolidation durvalumab if she has good response to chemo RT  -The PACIFIC trial demonstrated that consolidation therapy with durvalumab after chemoradiotherapy in unresectable stage III non-small-cell lung cancer (NSCLC) significantly improved both progression-free survival (PFS) and overall survival (OS). At 5 years, the estimated OS rate was 42.9% for the durvalumab group compared to 33.4% for the placebo group, and the estimated PFS rate was 33.1% versus 19.0%, respectively.  - Discussed most common expected side effects with chemoradiotherapy with carboplatin and paclitaxel commonly causes hematologic toxicities, including grade 3/4 neutropenia, anemia, and thrombocytopenia, with neutropenic fever occurring in a minority of cases. Non-hematologic side effects may include nausea, vomiting, fatigue, and peripheral neuropathy, which can be dose-limiting. Serious adverse events are rare but may involve allergic reactions, renal toxicity, or mucositis.  Recommended patient to reach out back to us   after completing chemoRT for transition of of care back to [facility]

## 2023-06-18 NOTE — Patient Instructions (Signed)
 VISIT SUMMARY:  Today, you had a consultation to discuss your stage IV cancer treatment options, including chemotherapy and radiation. We also reviewed your recent stroke, folic acid  deficiency, and the necessary steps to proceed with your treatment plan.  YOUR PLAN:  -STAGE 4 LUNG CANCER: Stage 4 lung cancer means the cancer has spread to other parts of your body, including your ribs and possibly your adrenal gland. We will manage it as stage 3 to keep more treatment options available. You will undergo low-dose chemotherapy and radiation together to make the radiation more effective. After the treatment, a CT scan will be done to see how well the treatment worked. If the results are good, you will start immunotherapy for one year. You will need a port for chemotherapy, and we will need clearance from your neurologist due to your recent stroke. The port placement has risks like bleeding, infection, and blood clots. We will also discuss the radiation plan with your radiation oncologist, especially regarding the adrenal gland involvement.  -FOLIC ACID  DEFICIENCY: You have a folic acid  deficiency, which means your folic acid  levels are slightly below normal. Folic acid  is important for making new cells in your body. We recommend taking a folic acid  supplement to bring your levels back to normal.  INSTRUCTIONS:  1. Coordinate your chemotherapy and radiation treatments with Dr. Selinda Dales at [facility]. 2. Schedule the port placement procedure and obtain clearance from your neurologist before the procedure. 3. After completing chemotherapy and radiation, have a CT scan four weeks later to assess the treatment response. 4. If the CT scan shows a favorable response, you will start one year of immunotherapy. 5. Discuss the radiation plan with your radiation oncologist, including the potential involvement of your adrenal gland.

## 2023-06-18 NOTE — Patient Instructions (Signed)
 Goals Great job on getting A1C down to 5.9%. Increase whole plant based foods from local farmer markets, farm stands and grocery stores. Increase exercise as tolerated.  No sodas and water  only. Aim for 25 grams of fiber per day. Lose 2 lbs per month. Goal weight is 225 lbs over the next 3-6 lbs.

## 2023-06-22 ENCOUNTER — Ambulatory Visit: Attending: Nurse Practitioner | Admitting: Nurse Practitioner

## 2023-06-22 ENCOUNTER — Other Ambulatory Visit: Payer: Self-pay | Admitting: Nurse Practitioner

## 2023-06-22 ENCOUNTER — Encounter: Payer: Self-pay | Admitting: Nurse Practitioner

## 2023-06-22 VITALS — BP 140/82 | HR 97 | Ht 67.0 in | Wt 251.6 lb

## 2023-06-22 DIAGNOSIS — E785 Hyperlipidemia, unspecified: Secondary | ICD-10-CM | POA: Diagnosis not present

## 2023-06-22 DIAGNOSIS — Z87898 Personal history of other specified conditions: Secondary | ICD-10-CM | POA: Diagnosis not present

## 2023-06-22 DIAGNOSIS — I10 Essential (primary) hypertension: Secondary | ICD-10-CM | POA: Diagnosis not present

## 2023-06-22 DIAGNOSIS — I480 Paroxysmal atrial fibrillation: Secondary | ICD-10-CM | POA: Diagnosis not present

## 2023-06-22 DIAGNOSIS — R42 Dizziness and giddiness: Secondary | ICD-10-CM

## 2023-06-22 DIAGNOSIS — R600 Localized edema: Secondary | ICD-10-CM

## 2023-06-22 DIAGNOSIS — M79605 Pain in left leg: Secondary | ICD-10-CM

## 2023-06-22 DIAGNOSIS — I38 Endocarditis, valve unspecified: Secondary | ICD-10-CM

## 2023-06-22 DIAGNOSIS — M79604 Pain in right leg: Secondary | ICD-10-CM

## 2023-06-22 DIAGNOSIS — N1831 Chronic kidney disease, stage 3a: Secondary | ICD-10-CM

## 2023-06-22 DIAGNOSIS — I739 Peripheral vascular disease, unspecified: Secondary | ICD-10-CM

## 2023-06-22 NOTE — Addendum Note (Signed)
 Addended by: Lasalle Pointer on: 06/22/2023 03:06 PM   Modules accepted: Orders

## 2023-06-22 NOTE — Progress Notes (Signed)
 Referral fixed. Referring to Dr. Berry Bristol d/t abnormal ABI's.   Lasalle Pointer, NP

## 2023-06-22 NOTE — Patient Instructions (Signed)
 Medication Instructions:  Your physician recommends that you continue on your current medications as directed. Please refer to the Current Medication list given to you today.  Labwork: None   Testing/Procedures: None   Follow-Up: Your physician recommends that you schedule a follow-up appointment in: 3 Months   Any Other Special Instructions Will Be Listed Below (If Applicable).  If you need a refill on your cardiac medications before your next appointment, please call your pharmacy.  (Patient Name)-_____________________________  (MRN)-_________________________     (Physician)-_________________________________   (DATE) -_________ (Blood Pressure)-_________  (Heart Rate)-_________    (DATE) -_________ (Blood Pressure)-_________  (Heart Rate)-_________    (DATE) -_________ (Blood Pressure)-_________  (Heart Rate)-_________   (DATE) -_________ (Blood Pressure)-_________  (Heart Rate)-_________    (DATE) -_________ (Blood Pressure)-_________  (Heart Rate)-_________    (DATE) -_________ (Blood Pressure)-_________  (Heart Rate)-_________   (DATE) -_________ (Blood Pressure)-_________  (Heart Rate)-_________    (DATE) -_________ (Blood Pressure)-_________  (Heart Rate)-_________    (DATE) -_________ (Blood Pressure)-_________  (Heart Rate)-_________    (DATE) -_________ (Blood Pressure)-_________  (Heart Rate)-_________    (DATE) -_________ (Blood Pressure)-_________  (Heart Rate)-_________    (DATE) -_________ (Blood Pressure)-_________  (Heart Rate)-_________    (DATE) -_________ (Blood Pressure)-_________  (Heart Rate)-_________    (DATE) -_________ (Blood Pressure)-_________  (Heart Rate)-_________

## 2023-06-22 NOTE — Progress Notes (Unsigned)
 Cardiology Office Note:  .   Date: 06/22/2023 ID:  Becky [name], DOB 14-Nov-1960, MRN [id number] PCP: [name], MD  [facility] Providers Cardiologist:  Armida Lander, MD Electrophysiologist:  Boyce Byes, MD    History of Present Illness: .   Becky [name] is a 63 y.o. female with a PMH of chest pain, PAF, HTN, HLD, shortness of breath/DOE, OSA, cough, dizziness, mild to moderate AI, CKD, and leg edema/leg pains, who presents today for 4 week follow-up.   Last seen by Dr. Armida Lander on March 25, 2022.  At that time, she noticed some increased symptoms of A-fib during some previous extended issues with respiratory tract infections, palpitations were resolving.  At that time, she noted some recurrent GI bleeding and was instructed to hold Xarelto  for 3 days and resume if her issues resolved.  At that time, she had a GI appointment the following week.  Recommended to that may need to reconsider Watchman, she has started evaluation.  ABIs were ordered due to her reported leg pains.  ED visit on April 05, 2023 for rectal bleeding. Fecal occult was positive.  H&H: 11.1/33.9.  Was discharged from ED with GI follow-up.  She was off her Xarelto  for 3 days as instructed.  Hospitalized early April 2025 due to complaints of numbness along right hand, numbness along left toes, also noted intermittent episodes of dizziness, headache, and blurred vision.  She also continues to note bloody stools.  No melena noted.  Xarelto  was held and was noted the last time she took medication was on March 27, 2023.  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  Noted to have nonbleeding internal hemorrhoids, some diverticulosis, recommended to have repeat colonoscopy in 5 years and remain on MiraLAX  daily.  Per GI, she was okay to resume OAC.  ED visit April 13, 2023  for dizziness.  Stated the symptoms felt similar before previous hospitalization when diagnosed with a stroke.  Reported being compliant with Xarelto .  Noted intermittent blurry vision and dizziness.  CT of the head was negative for anything acute.  Blood work was unremarkable.  Contacted our office on April 18 noting intermittent lightheadedness, noting labile heart rates, self decreased metoprolol  tartrate to 25 mg twice daily, seemed to help her blood pressure and heart rate from dropping so much.  Denied syncope. Woodfin Hays, PA-C recommended to further reduce Lopressor  to 12.5 mg BID.   05/06/2023 - Today she presents for scheduled follow-up.  She states she is doing better than previously.  Tells me she had 3 days of some bleeding noticed after her colonoscopy, then the bleeding stopped.  She denies any recent recurrent GI bleeding.  Admits to some lower SBP readings, confirms SBP ranging from 95- 118.  Notices heart rate dropping at nighttime, 40's at times even after lowering Lopressor  to 12.5 mg BID, HR on average is between 53-63 bpm.  She is compliant with her CPAP usage.  Admits to some lightheadedness and dizziness triggered by lights and stimuli from her phone.  Says she has been diagnosed with vertigo in the past, has tried medicine for vertigo previously.  Currently taking olmesartan  20 mg daily. Denies any chest pain, shortness of breath, palpitations, syncope, presyncope, orthopnea, PND, swelling or significant weight changes, or claudication.  06/22/2023 -  Here for follow-up. BP readings are labile per her report. She tells me  of her new diagnosis of lung cancer, coping well. Will be seeing her oncologist on June 30, 2023. Does admit to some nausea with her chemotherapy. Does admit to intermittent dizziness, some improved compared to last office visit. Continues to note lightheadedness. Denies any chest pain, shortness of breath, palpitations, syncope, presyncope, orthopnea, PND,  swelling or significant weight changes, acute bleeding, or claudication.  ROS: Negative. See HPI.   Studies Reviewed: Aaron Aas    EKG: EKG is not ordered today.   Arterial duplex 06/2023:  Summary:  Right: Atherosclerotic plaque noted throughout the left lower extremity  with dampened monphasic doppler waveforms starting at the level of the  popliteal artery. May suggest hemodynamically significant stenosis at the  popliteal level.  Right ABI mildly reduced at 0.75   Left: Left: Atherosclerotic plaque noted throughout the left lower  extremity with dampened monphasic doppler waveforms starting at the level  of the popliteal artery. May suggest hemodynamically significant stenosis  at the popliteal artery level.  Left ABI moderately reduced at 0.55.     See table(s) above for measurements and observations.   Suggest follow up study in 12 months.  Suggest Peripheral Vascular Consult.   ABI's 06/2023:  Summary:  Right: Resting right ankle-brachial index indicates moderate right lower  extremity arterial disease. The right toe-brachial index is abnormal.   Left: Resting left ankle-brachial index indicates moderate left lower  extremity arterial disease. The left toe-brachial index is abnormal.    *See table(s) above for measurements and observations.    Suggest follow up study in 12 months.   ABI's 04/28/2023:  Summary:  Right: Resting right ankle-brachial index indicates moderate right lower  extremity arterial disease. The right toe-brachial index is abnormal.   Left: Resting left ankle-brachial index indicates moderate left lower  extremity arterial disease. The left toe-brachial index is abnormal.    *See table(s) above for measurements and observations.    Suggest follow up study in 12 months.  Echo 04/2023:  1. Left ventricular ejection fraction, by estimation, is 60 to 65%. The  left ventricle has normal function. The left ventricle has no regional  wall motion  abnormalities. Left ventricular diastolic parameters are  consistent with Grade II diastolic  dysfunction (pseudonormalization). Elevated left atrial pressure.   2. Right ventricular systolic function is normal. The right ventricular  size is normal. Tricuspid regurgitation signal is inadequate for assessing  PA pressure.   3. Left atrial size was mildly dilated.   4. The mitral valve is normal in structure. Trivial mitral valve  regurgitation. No evidence of mitral stenosis.   5. The aortic valve is tricuspid. Aortic valve regurgitation is mild to  moderate. No aortic stenosis is present.   6. The inferior vena cava is dilated in size with >50% respiratory  variability, suggesting right atrial pressure of 8 mmHg.  CT cardiac 03/2021:  IMPRESSION: 1. The left atrial appendage is a large chicken wing morphology without thrombus.   2. A 27 mm Watchman FLX device is recommended based on the above landing zone measurements (20.0 mm maximum diameter; 26% compression).   3. There is no thrombus in the left atrial appendage.   4. A mid/mid IAS puncture site is recommended.   5. Optimal deployment angle: RAO 1 CRA 23   6. Normal coronary origin. Right dominance. CAC score of 148, which is 94th percentile for age-, sex-, and race-matched controls.  LHC 07/2019:    Prox RCA to Mid RCA lesion is 25% stenosed. Prox LAD  to Mid LAD lesion is 25% stenosed. The left ventricular systolic function is normal. The left ventricular ejection fraction is 55-65% by visual estimate. LV end diastolic pressure is normal. LVEDP 15 mm Hg. There is no aortic valve stenosis.   Continue medical therapy.     Of note, patient was in AFib during the cath.  Rate was controlled and she did not feel palpitations.    Physical Exam:   VS:  BP (!) 140/82   Pulse 97   Ht 5' 7 (1.702 m)   Wt 251 lb 9.6 oz (114.1 kg)   SpO2 98%   BMI 39.41 kg/m    Wt Readings from Last 3 Encounters:  06/24/23 250 lb (113.4  kg)  06/22/23 251 lb 9.6 oz (114.1 kg)  06/18/23 249 lb (112.9 kg)    GEN: Morbidly obese, 63 y.o. female in no acute distress NECK: No JVD; No carotid bruits CARDIAC: S1/S2, RRR, no murmurs, rubs, gallops RESPIRATORY:  Clear to auscultation without rales, wheezing or rhonchi  ABDOMEN: Soft, non-tender, non-distended EXTREMITIES:  No edema; No deformity   ASSESSMENT AND PLAN: .   PAF, hx of bradycardia Denies any tachycardia or palpitations. HR is well controlled today. No more GI bleeding per her report. Continue diltiazem . Continue Xarelto  for stroke prevention. On appropriate dosage. Denies any recent bleeding issues. Heart healthy diet and regular cardiovascular exercise encouraged.   HTN BP mildly elevated today.  Discussed to monitor BP at home at least 2 hours after medications and sitting for 5-10 minutes. Medical therapy limited d/t her current symptoms. Will continue to monitor. Heart healthy diet and regular cardiovascular exercise encouraged.   HLD Most recent LDL 93. Continue rosuvastatin . Heart healthy diet and regular cardiovascular exercise encouraged.   Dizziness Most likely r/t vertigo based on patient's description. Orthostatics negative in the past. Care and ED precautions discussed. Continue to follow with PCP.  Valvular insufficiency Mild to mod AR noted on most recent Echo, 04/2023. Denies any symptoms. Plan to update Echo in 1-2 years or sooner if clinically indicated.   Leg edema/leg pains/PAD See most recent vascular studies noted above. Peripheral Vascular consult was recommended, have already placed referral for PAD evaluation. Continue current medication regimen. Care and ED precautions discussed.   CKD stage 3a Most recent labs show stable kidney disease. Avoid nephrotoxic agents. Continue current medication regimen. Continue to follow with PCP.   Dispo: Follow-up with Dr. Armida Lander or APP in 3 months or sooner if anything changes.    Signed, Lasalle Pointer, NP

## 2023-06-22 NOTE — Progress Notes (Signed)
 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 {:18581} Pacemaker/ICD? {:18581} Possible current pregnancy? no Is the patient on methotrexate? {:18581}  Current Complaints / other details:  ***     ***

## 2023-06-22 NOTE — Telephone Encounter (Signed)
Checked status still pending.  
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
## 2023-06-24 ENCOUNTER — Encounter: Payer: Self-pay | Admitting: Radiation Oncology

## 2023-06-24 ENCOUNTER — Ambulatory Visit (HOSPITAL_COMMUNITY)

## 2023-06-24 ENCOUNTER — Ambulatory Visit
Admission: RE | Admit: 2023-06-24 | Discharge: 2023-06-24 | Disposition: A | Discharge status: Home / Self Care | Payer: Self-pay | Source: Ambulatory Visit | Attending: Radiation Oncology | Admitting: Radiation Oncology

## 2023-06-24 ENCOUNTER — Encounter (HOSPITAL_COMMUNITY): Payer: Self-pay

## 2023-06-24 VITALS — BP 155/69 | HR 81 | Temp 97.8°F | Resp 20 | Ht 66.0 in | Wt 250.0 lb

## 2023-06-24 DIAGNOSIS — C3411 Malignant neoplasm of upper lobe, right bronchus or lung: Secondary | ICD-10-CM

## 2023-06-24 DIAGNOSIS — Z7984 Long term (current) use of oral hypoglycemic drugs: Secondary | ICD-10-CM | POA: Insufficient documentation

## 2023-06-24 DIAGNOSIS — E119 Type 2 diabetes mellitus without complications: Secondary | ICD-10-CM | POA: Diagnosis not present

## 2023-06-24 DIAGNOSIS — E039 Hypothyroidism, unspecified: Secondary | ICD-10-CM | POA: Insufficient documentation

## 2023-06-24 DIAGNOSIS — G4730 Sleep apnea, unspecified: Secondary | ICD-10-CM | POA: Insufficient documentation

## 2023-06-24 DIAGNOSIS — I119 Hypertensive heart disease without heart failure: Secondary | ICD-10-CM | POA: Insufficient documentation

## 2023-06-24 DIAGNOSIS — C7951 Secondary malignant neoplasm of bone: Secondary | ICD-10-CM | POA: Insufficient documentation

## 2023-06-24 DIAGNOSIS — I2510 Atherosclerotic heart disease of native coronary artery without angina pectoris: Secondary | ICD-10-CM | POA: Diagnosis not present

## 2023-06-24 DIAGNOSIS — I480 Paroxysmal atrial fibrillation: Secondary | ICD-10-CM | POA: Insufficient documentation

## 2023-06-24 DIAGNOSIS — Z79890 Hormone replacement therapy (postmenopausal): Secondary | ICD-10-CM | POA: Diagnosis not present

## 2023-06-24 DIAGNOSIS — Z79899 Other long term (current) drug therapy: Secondary | ICD-10-CM | POA: Diagnosis not present

## 2023-06-24 DIAGNOSIS — Z8673 Personal history of transient ischemic attack (TIA), and cerebral infarction without residual deficits: Secondary | ICD-10-CM | POA: Insufficient documentation

## 2023-06-24 DIAGNOSIS — Z87891 Personal history of nicotine dependence: Secondary | ICD-10-CM | POA: Diagnosis not present

## 2023-06-24 DIAGNOSIS — C7972 Secondary malignant neoplasm of left adrenal gland: Secondary | ICD-10-CM | POA: Insufficient documentation

## 2023-06-24 DIAGNOSIS — Z7409 Other reduced mobility: Secondary | ICD-10-CM

## 2023-06-24 DIAGNOSIS — E785 Hyperlipidemia, unspecified: Secondary | ICD-10-CM | POA: Insufficient documentation

## 2023-06-24 DIAGNOSIS — R29898 Other symptoms and signs involving the musculoskeletal system: Secondary | ICD-10-CM
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
## 2023-06-25 ENCOUNTER — Telehealth: Payer: Self-pay | Admitting: Medical Oncology

## 2023-06-25 ENCOUNTER — Telehealth (HOSPITAL_COMMUNITY): Payer: Self-pay

## 2023-06-25 NOTE — Telephone Encounter (Signed)
 Becky [name] tried to schedule her port a cath placement  from Dr Orvis Blare order in [place]. Pt was confused because she is seeing Dr Marguerita Shih as a new pt and wants to see him first before deciding on the port a cath .

## 2023-06-25 NOTE — Telephone Encounter (Signed)
 Called pt to schedule port placement. She was a little confused on what to do. She is now going to be seeing Dr. Marguerita Shih on 6/24. So I called and spoke to Diane Banker) from Dr. Bing Buff office to check and we will hold off on port for now until pt see's Dr. Marguerita Shih. AB   I did call the pt to explain but no answer, left vm for her to call back. AB

## 2023-06-29 ENCOUNTER — Other Ambulatory Visit: Payer: Self-pay | Admitting: Medical Oncology

## 2023-06-29 ENCOUNTER — Ambulatory Visit: Payer: Self-pay | Admitting: Nurse Practitioner

## 2023-06-29 DIAGNOSIS — C3491 Malignant neoplasm of unspecified part of right bronchus or lung: Secondary | ICD-10-CM

## 2023-06-29 NOTE — Telephone Encounter (Signed)
Checked status it is still pending.  

## 2023-06-30 ENCOUNTER — Inpatient Hospital Stay

## 2023-06-30 ENCOUNTER — Telehealth: Payer: Self-pay | Admitting: *Deleted

## 2023-06-30 ENCOUNTER — Ambulatory Visit: Admitting: Radiation Oncology

## 2023-06-30 ENCOUNTER — Encounter: Payer: Self-pay | Admitting: Internal Medicine

## 2023-06-30 ENCOUNTER — Inpatient Hospital Stay: Admitting: Internal Medicine

## 2023-06-30 ENCOUNTER — Ambulatory Visit
Admission: RE | Admit: 2023-06-30 | Discharge: 2023-06-30 | Disposition: A | Discharge status: Home / Self Care | Source: Ambulatory Visit | Attending: Radiation Oncology | Admitting: Radiation Oncology

## 2023-06-30 VITALS — BP 132/54 | HR 68 | Temp 98.3°F | Resp 17 | Ht 66.0 in | Wt 250.0 lb

## 2023-06-30 DIAGNOSIS — C3411 Malignant neoplasm of upper lobe, right bronchus or lung: Secondary | ICD-10-CM | POA: Diagnosis not present

## 2023-06-30 DIAGNOSIS — Z87891 Personal history of nicotine dependence: Secondary | ICD-10-CM | POA: Insufficient documentation

## 2023-06-30 DIAGNOSIS — C7951 Secondary malignant neoplasm of bone: Secondary | ICD-10-CM | POA: Insufficient documentation

## 2023-06-30 DIAGNOSIS — C3491 Malignant neoplasm of unspecified part of right bronchus or lung: Secondary | ICD-10-CM | POA: Diagnosis not present

## 2023-06-30 LAB — CMP (CANCER CENTER ONLY)
ALT: 14 U/L (ref 0–44)
AST: 17 U/L (ref 15–41)
Albumin: 4.4 g/dL (ref 3.5–5.0)
Alkaline Phosphatase: 80 U/L (ref 38–126)
Anion gap: 6 (ref 5–15)
BUN: 19 mg/dL (ref 8–23)
CO2: 26 mmol/L (ref 22–32)
Calcium: 10.2 mg/dL (ref 8.9–10.3)
Chloride: 104 mmol/L (ref 98–111)
Creatinine: 1.3 mg/dL — ABNORMAL HIGH (ref 0.44–1.00)
GFR, Estimated: 46 mL/min — ABNORMAL LOW (ref >60)
Glucose, Bld: 152 mg/dL — ABNORMAL HIGH (ref 70–99)
Potassium: 4.1 mmol/L (ref 3.5–5.1)
Sodium: 136 mmol/L (ref 135–145)
Total Bilirubin: 0.4 mg/dL (ref 0.0–1.2)
Total Protein: 6.9 g/dL (ref 6.5–8.1)

## 2023-06-30 LAB — CBC WITH DIFFERENTIAL (CANCER CENTER ONLY)
Abs Immature Granulocytes: 0.03 10*3/uL (ref 0.00–0.07)
Basophils Absolute: 0 10*3/uL (ref 0.0–0.1)
Basophils Relative: 0 %
Eosinophils Absolute: 0.1 10*3/uL (ref 0.0–0.5)
Eosinophils Relative: 2 %
HCT: 36.1 % (ref 36.0–46.0)
Hemoglobin: 12.2 g/dL (ref 12.0–15.0)
Immature Granulocytes: 0 %
Lymphocytes Relative: 29 %
Lymphs Abs: 2.2 10*3/uL (ref 0.7–4.0)
MCH: 29.6 pg (ref 26.0–34.0)
MCHC: 33.8 g/dL (ref 30.0–36.0)
MCV: 87.6 fL (ref 80.0–100.0)
Monocytes Absolute: 0.7 10*3/uL (ref 0.1–1.0)
Monocytes Relative: 9 %
Neutro Abs: 4.5 10*3/uL (ref 1.7–7.7)
Neutrophils Relative %: 60 %
Platelet Count: 223 10*3/uL (ref 150–400)
RBC: 4.12 MIL/uL (ref 3.87–5.11)
RDW: 12.8 % (ref 11.5–15.5)
WBC Count: 7.6 10*3/uL (ref 4.0–10.5)
nRBC: 0 % (ref 0.0–0.2)

## 2023-06-30 MED ORDER — ONDANSETRON HCL 8 MG PO TABS: 8.0000 mg | ORAL_TABLET | Freq: Three times a day (TID) | ORAL | 1 refills | Status: DC | PRN

## 2023-06-30 MED ORDER — PROCHLORPERAZINE MALEATE 10 MG PO TABS: 10.0000 mg | ORAL_TABLET | Freq: Four times a day (QID) | ORAL | 1 refills | Status: DC | PRN

## 2023-06-30 MED ORDER — LIDOCAINE-PRILOCAINE 2.5-2.5 % EX CREA: TOPICAL_CREAM | CUTANEOUS | 3 refills | Status: DC

## 2023-06-30 NOTE — Telephone Encounter (Signed)
 [employer] denied the HST. Please see below for the reason's. Do you want to do an appeal?

## 2023-06-30 NOTE — Telephone Encounter (Signed)
 06/29/2022:[employer] form has been completed for Becky [name] daughter [name] awaiting provider review and signature.   Today Sonya and sister Luke in office for Rad/Onc and Med/Onc appointments to check status of form.  Kim reports I didn't bring mine in today but will bring it in later.  It is the general [id number] form.   Obtained a second ROI, PDFof 3/80-F form.  Completred form and notified provider.   Provider wants to see patient, determine Med/Onc treatment plan before signing FMLA forms. Informed Sonya Brewer [phone]) of above.  Asked to return to office [phone] to sign employee section of form after provider visit completed.SABRA
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
## 2023-06-30 NOTE — Telephone Encounter (Signed)
 Amy- do you want to appeal?

## 2023-06-30 NOTE — Telephone Encounter (Signed)
 Amy, I wonder if we can order a nocturnal polysomnogram as opposed to a home sleep test on the basis of PAP intolerance, I can try to order it tomorrow during her appointment if she is willing to come in for a sleep study.

## 2023-07-01 ENCOUNTER — Ambulatory Visit (HOSPITAL_COMMUNITY)

## 2023-07-01 ENCOUNTER — Telehealth (HOSPITAL_COMMUNITY): Payer: Self-pay

## 2023-07-01 ENCOUNTER — Encounter: Payer: Self-pay | Admitting: Internal Medicine

## 2023-07-01 ENCOUNTER — Encounter (HOSPITAL_COMMUNITY): Payer: Self-pay

## 2023-07-01 ENCOUNTER — Ambulatory Visit: Admitting: Neurology

## 2023-07-01 ENCOUNTER — Encounter: Payer: Self-pay | Admitting: Neurology

## 2023-07-01 ENCOUNTER — Other Ambulatory Visit: Payer: Self-pay

## 2023-07-01 VITALS — BP 130/65 | HR 71 | Ht 67.0 in | Wt 249.8 lb

## 2023-07-01 DIAGNOSIS — C3491 Malignant neoplasm of unspecified part of right bronchus or lung: Secondary | ICD-10-CM

## 2023-07-01 DIAGNOSIS — R200 Anesthesia of skin: Secondary | ICD-10-CM | POA: Diagnosis not present

## 2023-07-01 DIAGNOSIS — G4733 Obstructive sleep apnea (adult) (pediatric): Secondary | ICD-10-CM | POA: Diagnosis not present

## 2023-07-01 DIAGNOSIS — Z8673 Personal history of transient ischemic attack (TIA), and cerebral infarction without residual deficits: Secondary | ICD-10-CM

## 2023-07-01 DIAGNOSIS — R202 Paresthesia of skin: Secondary | ICD-10-CM

## 2023-07-01 NOTE — Progress Notes (Unsigned)
 I met the pt for the first time on 6/24 at her medical oncology consult with Dr. Sherrod. Pt was accompanied by her son, Sergio, and her two dtrs, Grayce and Kim. The pt's primary oncologist is Dr Davonna at [facility], however because the pt also needs radiation, she will be under Dr. Blas care while she is on chemotherapy.  The plan for the pt is for 6.5wks of chemoradiation with weekly carbo/taxol.  Pt is interested in a port. Would like to have placed at [facility] or [facility]. Navigator will arrange.  Pt and family escorted to scheduling to schedule chemo education class and then FMLA nurses to assist with those arrangements. I provided my direct contact information to all family members and encouraged them to call me as needed.

## 2023-07-01 NOTE — Telephone Encounter (Signed)
 3rd no show, called and spoke to pt who had previous apts earlier today and had forgotten about today's apt. Pt educated on no show policy. No one educated no show policy previously, cancelled all remaining apt except for next apt and educated she will need to schedule one at a time following with verbalized understanding.   Augustin Mclean, LPTA/CLT; [facility] [phone]

## 2023-07-01 NOTE — Progress Notes (Signed)
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 bilaterally, no telltale difference between sensation.   Gait, station and balance: She stands easily. No veering to one side is noted. No leaning to one side is noted. Posture is age-appropriate and stance is slightly wider base and she walks with a limp on the right, no walking aid.     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  She does not have much in the way of pain and previously tried gabapentin  and did not end up taking it consistently, she has not taken it in years.   Thankfully, she does not have much in the way of pain.  In fact, her sensory exam is quite benign today and we discussed several diagnostic options and mutually agreed to forego any electrophysiological testing of her nerves and muscles with EMG/nerve conduction velocity test at this time but we can consider it later on.  For now, I would like to do some blood work and I would like for her to get started on some oral vitamin B12 and get it rechecked through your office in the next 3 to 6 months.  She is going to start chemo therapy and radiation soon.  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  We talked about all of these issues at length today including her challenge with CPAP therapy.  I would not favor doing another sleep study quite yet, her machine is working okay currently and she is willing to continue to try using it consistently.  We may consider a sleep study at her next visit in about 6 months but may not need to at the time depending on how things are going.  I think her focus should be on getting through chemoradiation.  She is reminded to stay well rested and well-hydrated and advised to follow-up in this clinic to see Greig Forbes, NP in about 6 months.  I answered all their questions today and the patient and her daughter were in agreement.   He will keep them posted as to her blood test results by phone call in the interim.  This was an extended visit of over 60 minutes with copious record  review involved and addressing multiple issues.  Thank you very much for allowing me to participate in the care of this nice patient. If I can be of any further assistance to you please do not hesitate to call me at [phone].   Sincerely,     True Mar, MD, PhD

## 2023-07-01 NOTE — Telephone Encounter (Signed)
Called to schedule port placement, no answer, left vm. AB

## 2023-07-01 NOTE — Telephone Encounter (Signed)
 Treatment plan established. Concurrent chemo-radiation expected.  Carboplatin/Taxol expected to begin 07/07/2023 through 08/17/2023.  Updated both FMLA forms with medical oncology treatment plan and appointments thus far for patient's daughters.  To collaborative for MD review and signature.

## 2023-07-02 ENCOUNTER — Ambulatory Visit (HOSPITAL_COMMUNITY): Admitting: Physical Therapy

## 2023-07-02 ENCOUNTER — Encounter: Payer: Self-pay | Admitting: Internal Medicine

## 2023-07-02 ENCOUNTER — Other Ambulatory Visit: Payer: Self-pay

## 2023-07-02 LAB — MULTIPLE MYELOMA PANEL, SERUM

## 2023-07-02 NOTE — Telephone Encounter (Signed)
 Pt saw Dr. Buck 07/01/23 for a visit.

## 2023-07-03 ENCOUNTER — Other Ambulatory Visit (HOSPITAL_COMMUNITY): Payer: Self-pay | Admitting: Student

## 2023-07-03 ENCOUNTER — Encounter (HOSPITAL_COMMUNITY): Payer: Self-pay

## 2023-07-03 NOTE — Therapy (Signed)
 [facility] Out[facility] Rehabilitation at [facility] [address] [place], [place], [address] Phone: [phone]   Fax:  [phone]  Patient Details  Name: Becky [name] MRN: [id number] Date of Birth: 1960-10-05 Referring Provider:  No ref. provider found  Encounter Date: 07/03/2023  PHYSICAL THERAPY DISCHARGE SUMMARY  Visits from Start of Care: 10  Current functional level related to goals / functional outcomes: Same as last encounter.   Remaining deficits: Same as last encounter.   Education / Equipment: Same as last encounter.   Patient agrees to discharge. Patient goals were partially met. Patient is being discharged due to not returning since the last visit.   Lang Ada, PT, DPT [facility] Office: [phone] 11:36 AM, 07/03/23   [facility] at [facility] [address] [place], [place], [address] Phone: [phone]   Fax:  [phone]

## 2023-07-06 ENCOUNTER — Ambulatory Visit: Payer: Self-pay | Admitting: Neurology

## 2023-07-06 ENCOUNTER — Ambulatory Visit (HOSPITAL_COMMUNITY)
Admission: RE | Admit: 2023-07-06 | Discharge: 2023-07-06 | Disposition: A | Discharge status: Home / Self Care | Source: Ambulatory Visit | Attending: Internal Medicine | Admitting: Internal Medicine

## 2023-07-06 ENCOUNTER — Inpatient Hospital Stay

## 2023-07-06 ENCOUNTER — Encounter (HOSPITAL_COMMUNITY): Payer: Self-pay

## 2023-07-06 ENCOUNTER — Other Ambulatory Visit: Payer: Self-pay | Admitting: Internal Medicine

## 2023-07-06 ENCOUNTER — Other Ambulatory Visit: Payer: Self-pay

## 2023-07-06 DIAGNOSIS — C7951 Secondary malignant neoplasm of bone: Secondary | ICD-10-CM | POA: Diagnosis present

## 2023-07-06 DIAGNOSIS — C7972 Secondary malignant neoplasm of left adrenal gland: Secondary | ICD-10-CM

## 2023-07-06 DIAGNOSIS — C3491 Malignant neoplasm of unspecified part of right bronchus or lung: Secondary | ICD-10-CM

## 2023-07-06 DIAGNOSIS — Z87891 Personal history of nicotine dependence: Secondary | ICD-10-CM | POA: Insufficient documentation

## 2023-07-06 DIAGNOSIS — C3411 Malignant neoplasm of upper lobe, right bronchus or lung: Secondary | ICD-10-CM | POA: Diagnosis present

## 2023-07-06 HISTORY — PX: IR IMAGING GUIDED PORT INSERTION: IMG5740

## 2023-07-06 LAB — MULTIPLE MYELOMA PANEL, SERUM
Albumin SerPl Elph-Mcnc: 4.1 g/dL (ref 2.9–4.4)
Albumin/Glob SerPl: 1.5 (ref 0.7–1.7)
Alpha2 Glob SerPl Elph-Mcnc: 0.7 g/dL (ref 0.4–1.0)
B-Globulin SerPl Elph-Mcnc: 1.1 g/dL (ref 0.7–1.3)
Gamma Glob SerPl Elph-Mcnc: 0.7 g/dL (ref 0.4–1.8)
Globulin, Total: 2.8 g/dL (ref 2.2–3.9)
IgA/Immunoglobulin A, Serum: 171 mg/dL (ref 87–352)
IgM (Immunoglobulin M), Srm: 136 mg/dL (ref 26–217)
Total Protein: 6.9 g/dL (ref 6.0–8.5)

## 2023-07-06 LAB — GLUCOSE, CAPILLARY
Glucose-Capillary: 112 mg/dL — ABNORMAL HIGH (ref 70–99)
Glucose-Capillary: 98 mg/dL (ref 70–99)

## 2023-07-06 LAB — VITAMIN B6: Vitamin B6: 10.6 ug/L (ref 3.4–65.2)

## 2023-07-06 LAB — ANA W/REFLEX: Anti Nuclear Antibody (ANA): NEGATIVE

## 2023-07-06 LAB — VITAMIN B1: Thiamine: 94.4 nmol/L (ref 66.5–200.0)

## 2023-07-06 MED ORDER — FENTANYL CITRATE (PF) 100 MCG/2ML IJ SOLN
INTRAMUSCULAR | Status: AC
  Filled 2023-07-06: qty 2

## 2023-07-06 MED ORDER — FENTANYL CITRATE (PF) 100 MCG/2ML IJ SOLN
INTRAMUSCULAR | Status: AC | PRN
  Administered 2023-07-06 (×2): 25 ug via INTRAVENOUS
  Administered 2023-07-06: 50 ug via INTRAVENOUS

## 2023-07-06 MED ORDER — HEPARIN SOD (PORK) LOCK FLUSH 100 UNIT/ML IV SOLN
INTRAVENOUS | Status: AC
  Filled 2023-07-06: qty 5

## 2023-07-06 MED ORDER — MIDAZOLAM HCL 2 MG/2ML IJ SOLN
INTRAMUSCULAR | Status: AC
  Filled 2023-07-06: qty 2

## 2023-07-06 MED ORDER — LIDOCAINE HCL 1 % IJ SOLN
INTRAMUSCULAR | Status: AC
  Filled 2023-07-06: qty 20

## 2023-07-06 MED ORDER — LIDOCAINE HCL 1 % IJ SOLN
20.0000 mL | Freq: Once | INTRAMUSCULAR | Status: AC
  Administered 2023-07-06: 20 mL

## 2023-07-06 MED ORDER — HEPARIN SOD (PORK) LOCK FLUSH 100 UNIT/ML IV SOLN
500.0000 [IU] | Freq: Once | INTRAVENOUS | Status: AC
  Administered 2023-07-06: 500 [IU] via INTRAVENOUS

## 2023-07-06 MED ORDER — MIDAZOLAM HCL 2 MG/2ML IJ SOLN
INTRAMUSCULAR | Status: AC | PRN
Start: 2023-07-06 — End: 2023-07-06
  Administered 2023-07-06: .5 mg via INTRAVENOUS
  Administered 2023-07-06: 1 mg via INTRAVENOUS
  Administered 2023-07-06: .5 mg via INTRAVENOUS

## 2023-07-06 MED ORDER — SODIUM CHLORIDE 0.9% FLUSH: 3.0000 mL | Freq: Two times a day (BID) | INTRAVENOUS | Status: DC

## 2023-07-06 MED ORDER — SODIUM CHLORIDE 0.9% FLUSH: 3.0000 mL | INTRAVENOUS | Status: DC | PRN

## 2023-07-06 NOTE — Procedures (Signed)
 Interventional Radiology Procedure Note  Procedure: Single Lumen Power Port Placement    Access:  Left IJ vein.  Findings: Catheter tip positioned at SVC/RA junction. Port is ready for immediate use.   Complications: None  EBL: < 10 mL  Recommendations:  - Ok to shower in 24 hours - Do not submerge for 7 days - Routine line care   [name] T. Fredia Sorrow, M.D Pager:  [phone]

## 2023-07-06 NOTE — Telephone Encounter (Addendum)
 Spoke to daughter (checked DPR) patient is currently in procedure  gave daughter lab results  Daughter states will inform patient and if she has any questions she will call us  back .
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
## 2023-07-07 ENCOUNTER — Ambulatory Visit (HOSPITAL_COMMUNITY): Admitting: Physical Therapy

## 2023-07-07 ENCOUNTER — Telehealth: Payer: Self-pay

## 2023-07-07 ENCOUNTER — Inpatient Hospital Stay: Admitting: Licensed Clinical Social Worker

## 2023-07-07 DIAGNOSIS — G4733 Obstructive sleep apnea (adult) (pediatric): Secondary | ICD-10-CM | POA: Insufficient documentation

## 2023-07-07 DIAGNOSIS — C7931 Secondary malignant neoplasm of brain: Secondary | ICD-10-CM | POA: Insufficient documentation

## 2023-07-07 DIAGNOSIS — C7951 Secondary malignant neoplasm of bone: Secondary | ICD-10-CM | POA: Insufficient documentation

## 2023-07-07 DIAGNOSIS — R110 Nausea: Secondary | ICD-10-CM | POA: Insufficient documentation

## 2023-07-07 DIAGNOSIS — K5900 Constipation, unspecified: Secondary | ICD-10-CM | POA: Insufficient documentation

## 2023-07-07 DIAGNOSIS — C7972 Secondary malignant neoplasm of left adrenal gland: Secondary | ICD-10-CM | POA: Insufficient documentation

## 2023-07-07 DIAGNOSIS — Z8673 Personal history of transient ischemic attack (TIA), and cerebral infarction without residual deficits: Secondary | ICD-10-CM | POA: Insufficient documentation

## 2023-07-07 DIAGNOSIS — C3491 Malignant neoplasm of unspecified part of right bronchus or lung: Secondary | ICD-10-CM

## 2023-07-07 DIAGNOSIS — Z7901 Long term (current) use of anticoagulants: Secondary | ICD-10-CM | POA: Insufficient documentation

## 2023-07-07 DIAGNOSIS — I4891 Unspecified atrial fibrillation: Secondary | ICD-10-CM | POA: Insufficient documentation

## 2023-07-07 DIAGNOSIS — C3411 Malignant neoplasm of upper lobe, right bronchus or lung: Secondary | ICD-10-CM | POA: Insufficient documentation

## 2023-07-07 NOTE — Telephone Encounter (Signed)
 Pt called for the results to be relayed to her by CMA

## 2023-07-07 NOTE — Telephone Encounter (Signed)
 Spoke to patient gave lab result . Pt expressed understanding

## 2023-07-07 NOTE — Telephone Encounter (Signed)
 Received voicemail from patient stating that her daughter has tested positive for COVID. Patient reported she plans to get tested as well, although she does not feel symptomatic.  Left voicemail for patient acknowledging that I saw she had spoken with Dr. Colton nurse, [place], and asked her to return the call if she has any additional concerns or questions.

## 2023-07-07 NOTE — Progress Notes (Signed)
 [facility] Clinical Social Work  Initial Assessment   Becky [name] is a 63 y.o. year old female contacted by phone. Clinical Social Work was referred by medical provider for assessment of psychosocial needs.   SDOH (Social Determinants of Health) assessments performed: Yes SDOH Interventions    Flowsheet Row Patient Outreach Telephone from 04/27/2023 in  POPULATION HEALTH DEPARTMENT Telephone from 03/24/2023 in [facility] Care Coordination Telephone from 02/24/2023 in [facility] Care Coordination from 12/02/2022 in [facility] Care Coordination from 11/20/2022 in [facility] Care Coordination from 10/16/2022 in [facility]  SDOH Interventions        Food Insecurity Interventions Intervention Not Indicated -- -- Intervention Not Indicated -- --  Housing Interventions -- -- -- Intervention Not Indicated -- --  Transportation Interventions Intervention Not Indicated Intervention Not Indicated Intervention Not Indicated Intervention Not Indicated Intervention Not Indicated Intervention Not Indicated  Utilities Interventions -- -- -- Intervention Not Indicated -- --  Financial Strain Interventions -- Intervention Not Indicated -- -- -- --  Health Literacy Interventions -- -- -- -- -- Intervention Not Indicated    SDOH Screenings   Food Insecurity: No Food Insecurity (06/30/2023)  Housing: Unknown (06/30/2023)  Transportation Needs: No Transportation Needs (06/30/2023)  Utilities: Not At Risk (06/30/2023)  Alcohol Screen: Low Risk  (05/28/2022)  Depression (PHQ2-9): Low Risk  (06/30/2023)  Financial Resource Strain: Low Risk  (03/24/2023)  Physical Activity: Sufficiently Active (08/25/2022)  Social Connections: Moderately Integrated (04/07/2023)  Stress: No Stress Concern Present (05/28/2022)  Tobacco Use:  Medium Risk (07/06/2023)  Health Literacy: Adequate Health Literacy (10/16/2022)     Distress Screen completed: Yes    07/06/2023   11:27 AM  [facility] DISTRESS SCREENING  Screening Type Initial Screening  How much distress have you been experiencing in the past week? (0-10) 7  Practical concerns type Taking care of myself  Social concerns type Relationship with spouse or partner  Emotional concerns type Worry or anxiety;Fear;Loneliness  Physical Concerns Type  Pain;Sleep;Fatigue;Changes in eating;Loss or change of physical abilities      Family/Social Information:  Housing Arrangement: patient lives with her husband and 1 of her daughters. Family members/support persons in your life? Pt has another daughter and son who reside locally.  All family members are supportive and will provide assistance as they are able. Transportation concerns: no  Employment: Disabled pt has been receiving SSDI for approximately 1 year.  Income source: Secretary/administrator concerns: Yes, current concerns Type of concern: Utilities and Medical bills Food access concerns: no Religious or spiritual practice: Not known Advanced directives: Not known Services Currently in place:  none  Coping/ Adjustment to diagnosis: Patient understands treatment plan and what happens next? yes Concerns about diagnosis and/or treatment: Overwhelmed by information, Afraid of cancer, and Quality of life Patient reported stressors: Finances, Anxiety/ nervousness, and Adjusting to my illness Hopes and/or priorities: pt's priority is to start treatment w/ the hope of positive results Patient enjoys time with family/ friends Current coping skills/ strengths: Capable of independent living , Motivation for treatment/growth , Physical Health , and Supportive family/friends     SUMMARY: Current SDOH Barriers:  Financial constraints related to fixed income  Clinical Social Work Clinical Goal(s):  Explore  community resource options for unmet needs related to:  Financial Strain   Interventions: Discussed common feeling and emotions when being diagnosed with cancer, and the importance of  support during treatment Informed patient of the support team roles and support services at [facility] Provided [name] contact information and encouraged patient to call with any questions or concerns Referred patient to community resources: [facility], pt informed of the [employer] and will apply for food stamps to determine eligibility.  Pt experiencing a great deal of anxiety presently.  Emotional support provided.  Pt informed of support group available through [facility] as well as individual counseling w/ [name].    Follow Up Plan: Patient will contact [name] with any support or resource needs Patient verbalizes understanding of plan: Yes    Devere JONELLE Manna, LCSW Clinical Social Worker [facility]

## 2023-07-07 NOTE — Telephone Encounter (Signed)
 Patient called in to report her daughter testing positive for Covid.Daughter does live with patient. Patient is not experiencing any symptoms at this time. Patient asking if she can come in on 07/08/23 to start radiation treatments. Per rad-onc lead  patient to test for Covid and call back with results, if test is negative patient can come in as scheduled. If test is positive patients treatment times will be moved to the end of the day. Patient was strongly encouraged to wear a face mask with positive or negative Covid test. Patient voiced understanding.

## 2023-07-07 NOTE — Telephone Encounter (Signed)
 Patient called back to report negative Covid results. Patient to wear face mask and retest if she becomes symptomatic.

## 2023-07-08 ENCOUNTER — Other Ambulatory Visit: Payer: Self-pay

## 2023-07-08 ENCOUNTER — Ambulatory Visit: Payer: BC Managed Care – PPO | Admitting: Family Medicine

## 2023-07-08 ENCOUNTER — Telehealth: Payer: Self-pay | Admitting: *Deleted

## 2023-07-08 ENCOUNTER — Ambulatory Visit
Admission: RE | Admit: 2023-07-08 | Discharge: 2023-07-08 | Disposition: A | Discharge status: Home / Self Care | Source: Ambulatory Visit | Attending: Radiation Oncology | Admitting: Radiation Oncology

## 2023-07-08 DIAGNOSIS — R918 Other nonspecific abnormal finding of lung field: Secondary | ICD-10-CM | POA: Diagnosis present

## 2023-07-08 DIAGNOSIS — C3491 Malignant neoplasm of unspecified part of right bronchus or lung: Secondary | ICD-10-CM | POA: Diagnosis present

## 2023-07-08 DIAGNOSIS — C7931 Secondary malignant neoplasm of brain: Secondary | ICD-10-CM | POA: Diagnosis present

## 2023-07-08 DIAGNOSIS — C3490 Malignant neoplasm of unspecified part of unspecified bronchus or lung: Secondary | ICD-10-CM | POA: Diagnosis present

## 2023-07-08 DIAGNOSIS — C7951 Secondary malignant neoplasm of bone: Secondary | ICD-10-CM | POA: Insufficient documentation

## 2023-07-08 DIAGNOSIS — F419 Anxiety disorder, unspecified: Secondary | ICD-10-CM | POA: Diagnosis present

## 2023-07-08 LAB — RAD ONC ARIA SESSION SUMMARY
Course Elapsed Days: 0
Plan Fractions Treated to Date: 1
Plan Prescribed Dose Per Fraction: 2 Gy
Plan Total Fractions Prescribed: 30
Plan Total Prescribed Dose: 60 Gy
Reference Point Dosage Given to Date: 2 Gy
Reference Point Session Dosage Given: 2 Gy
Session Number: 1

## 2023-07-08 NOTE — Telephone Encounter (Signed)
 Patient called reported some mild constipation, active hemorrhoids and some scant spotting of blood when wiping.  Last labs show Plts/Hgb stable.  Instructed to take her Linzess  to avoid straining, increase water  intake and report if the bleeding gets worse.  States understanding.

## 2023-07-09 ENCOUNTER — Ambulatory Visit (HOSPITAL_COMMUNITY): Admitting: Physical Therapy

## 2023-07-09 ENCOUNTER — Ambulatory Visit
Admission: RE | Admit: 2023-07-09 | Discharge: 2023-07-09 | Disposition: A | Discharge status: Home / Self Care | Source: Ambulatory Visit | Attending: Radiation Oncology | Admitting: Radiation Oncology

## 2023-07-09 ENCOUNTER — Other Ambulatory Visit: Payer: Self-pay

## 2023-07-09 DIAGNOSIS — C7931 Secondary malignant neoplasm of brain: Secondary | ICD-10-CM | POA: Diagnosis not present

## 2023-07-09 DIAGNOSIS — I63442 Cerebral infarction due to embolism of left cerebellar artery: Secondary | ICD-10-CM | POA: Diagnosis not present

## 2023-07-09 LAB — RAD ONC ARIA SESSION SUMMARY
Course Elapsed Days: 1
Plan Fractions Treated to Date: 2
Plan Prescribed Dose Per Fraction: 2 Gy
Plan Total Fractions Prescribed: 30
Plan Total Prescribed Dose: 60 Gy
Reference Point Dosage Given to Date: 4 Gy
Reference Point Session Dosage Given: 2 Gy
Session Number: 2

## 2023-07-12 ENCOUNTER — Ambulatory Visit
Admission: EM | Admit: 2023-07-12 | Discharge: 2023-07-12 | Disposition: A | Discharge status: Home / Self Care | Source: Ambulatory Visit | Attending: Nurse Practitioner | Admitting: Nurse Practitioner

## 2023-07-12 ENCOUNTER — Emergency Department (HOSPITAL_COMMUNITY)

## 2023-07-12 ENCOUNTER — Other Ambulatory Visit: Payer: Self-pay

## 2023-07-12 ENCOUNTER — Encounter (HOSPITAL_COMMUNITY): Payer: Self-pay | Admitting: *Deleted

## 2023-07-12 ENCOUNTER — Encounter: Payer: Self-pay | Admitting: Emergency Medicine

## 2023-07-12 ENCOUNTER — Inpatient Hospital Stay (HOSPITAL_COMMUNITY)
Admission: EM | Admit: 2023-07-12 | Discharge: 2023-07-14 | DRG: 065 | Disposition: A | Discharge status: Home / Self Care | Attending: Internal Medicine | Admitting: Internal Medicine

## 2023-07-12 ENCOUNTER — Encounter: Payer: Self-pay | Admitting: Internal Medicine

## 2023-07-12 DIAGNOSIS — I63442 Cerebral infarction due to embolism of left cerebellar artery: Principal | ICD-10-CM | POA: Diagnosis present

## 2023-07-12 DIAGNOSIS — Z833 Family history of diabetes mellitus: Secondary | ICD-10-CM

## 2023-07-12 DIAGNOSIS — C3490 Malignant neoplasm of unspecified part of unspecified bronchus or lung: Secondary | ICD-10-CM | POA: Diagnosis present

## 2023-07-12 DIAGNOSIS — Z96651 Presence of right artificial knee joint: Secondary | ICD-10-CM | POA: Diagnosis present

## 2023-07-12 DIAGNOSIS — R9089 Other abnormal findings on diagnostic imaging of central nervous system: Secondary | ICD-10-CM | POA: Diagnosis not present

## 2023-07-12 DIAGNOSIS — R0789 Other chest pain: Secondary | ICD-10-CM | POA: Diagnosis not present

## 2023-07-12 DIAGNOSIS — E039 Hypothyroidism, unspecified: Secondary | ICD-10-CM | POA: Diagnosis present

## 2023-07-12 DIAGNOSIS — Z8249 Family history of ischemic heart disease and other diseases of the circulatory system: Secondary | ICD-10-CM | POA: Diagnosis not present

## 2023-07-12 DIAGNOSIS — Z91040 Latex allergy status: Secondary | ICD-10-CM

## 2023-07-12 DIAGNOSIS — I10 Essential (primary) hypertension: Secondary | ICD-10-CM | POA: Diagnosis present

## 2023-07-12 DIAGNOSIS — I639 Cerebral infarction, unspecified: Secondary | ICD-10-CM | POA: Diagnosis not present

## 2023-07-12 DIAGNOSIS — J069 Acute upper respiratory infection, unspecified: Secondary | ICD-10-CM | POA: Insufficient documentation

## 2023-07-12 DIAGNOSIS — I482 Chronic atrial fibrillation, unspecified: Secondary | ICD-10-CM | POA: Diagnosis present

## 2023-07-12 DIAGNOSIS — C3491 Malignant neoplasm of unspecified part of right bronchus or lung: Secondary | ICD-10-CM | POA: Diagnosis not present

## 2023-07-12 DIAGNOSIS — Z8673 Personal history of transient ischemic attack (TIA), and cerebral infarction without residual deficits: Secondary | ICD-10-CM | POA: Diagnosis not present

## 2023-07-12 DIAGNOSIS — Z7984 Long term (current) use of oral hypoglycemic drugs: Secondary | ICD-10-CM

## 2023-07-12 DIAGNOSIS — D6869 Other thrombophilia: Secondary | ICD-10-CM | POA: Diagnosis present

## 2023-07-12 DIAGNOSIS — Z87891 Personal history of nicotine dependence: Secondary | ICD-10-CM

## 2023-07-12 DIAGNOSIS — E785 Hyperlipidemia, unspecified: Secondary | ICD-10-CM | POA: Diagnosis present

## 2023-07-12 DIAGNOSIS — R29700 NIHSS score 0: Secondary | ICD-10-CM | POA: Diagnosis present

## 2023-07-12 DIAGNOSIS — N179 Acute kidney failure, unspecified: Secondary | ICD-10-CM | POA: Diagnosis not present

## 2023-07-12 DIAGNOSIS — Z79890 Hormone replacement therapy (postmenopausal): Secondary | ICD-10-CM

## 2023-07-12 DIAGNOSIS — I6340 Cerebral infarction due to embolism of unspecified cerebral artery: Secondary | ICD-10-CM | POA: Diagnosis not present

## 2023-07-12 DIAGNOSIS — Z7901 Long term (current) use of anticoagulants: Secondary | ICD-10-CM

## 2023-07-12 DIAGNOSIS — E66812 Obesity, class 2: Secondary | ICD-10-CM | POA: Diagnosis present

## 2023-07-12 DIAGNOSIS — J029 Acute pharyngitis, unspecified: Secondary | ICD-10-CM | POA: Diagnosis not present

## 2023-07-12 DIAGNOSIS — G4733 Obstructive sleep apnea (adult) (pediatric): Secondary | ICD-10-CM | POA: Diagnosis present

## 2023-07-12 DIAGNOSIS — Z79899 Other long term (current) drug therapy: Secondary | ICD-10-CM

## 2023-07-12 DIAGNOSIS — I4891 Unspecified atrial fibrillation: Secondary | ICD-10-CM | POA: Diagnosis not present

## 2023-07-12 DIAGNOSIS — E1169 Type 2 diabetes mellitus with other specified complication: Secondary | ICD-10-CM | POA: Diagnosis present

## 2023-07-12 DIAGNOSIS — I4892 Unspecified atrial flutter: Secondary | ICD-10-CM | POA: Diagnosis present

## 2023-07-12 DIAGNOSIS — R29701 NIHSS score 1: Secondary | ICD-10-CM | POA: Diagnosis not present

## 2023-07-12 DIAGNOSIS — I2510 Atherosclerotic heart disease of native coronary artery without angina pectoris: Secondary | ICD-10-CM | POA: Diagnosis present

## 2023-07-12 DIAGNOSIS — C7931 Secondary malignant neoplasm of brain: Secondary | ICD-10-CM | POA: Diagnosis present

## 2023-07-12 DIAGNOSIS — I6389 Other cerebral infarction: Secondary | ICD-10-CM | POA: Diagnosis not present

## 2023-07-12 DIAGNOSIS — Z6838 Body mass index (BMI) 38.0-38.9, adult: Secondary | ICD-10-CM

## 2023-07-12 DIAGNOSIS — I6349 Cerebral infarction due to embolism of other cerebral artery: Secondary | ICD-10-CM | POA: Diagnosis present

## 2023-07-12 LAB — CBC WITH DIFFERENTIAL/PLATELET
Abs Immature Granulocytes: 0.02 K/uL (ref 0.00–0.07)
Basophils Absolute: 0 K/uL (ref 0.0–0.1)
Basophils Relative: 0 %
Eosinophils Absolute: 0.2 K/uL (ref 0.0–0.5)
Eosinophils Relative: 3 %
HCT: 37.9 % (ref 36.0–46.0)
Hemoglobin: 12.5 g/dL (ref 12.0–15.0)
Immature Granulocytes: 0 %
Lymphocytes Relative: 28 %
Lymphs Abs: 1.9 K/uL (ref 0.7–4.0)
MCH: 30 pg (ref 26.0–34.0)
MCHC: 33 g/dL (ref 30.0–36.0)
MCV: 90.9 fL (ref 80.0–100.0)
Monocytes Absolute: 0.8 K/uL (ref 0.1–1.0)
Monocytes Relative: 11 %
Neutro Abs: 4 K/uL (ref 1.7–7.7)
Neutrophils Relative %: 58 %
Platelets: 191 K/uL (ref 150–400)
RBC: 4.17 MIL/uL (ref 3.87–5.11)
RDW: 12.6 % (ref 11.5–15.5)
WBC: 6.9 K/uL (ref 4.0–10.5)
nRBC: 0 % (ref 0.0–0.2)

## 2023-07-12 LAB — BASIC METABOLIC PANEL WITH GFR
Anion gap: 12 (ref 5–15)
BUN: 18 mg/dL (ref 8–23)
CO2: 25 mmol/L (ref 22–32)
Calcium: 9.8 mg/dL (ref 8.9–10.3)
Chloride: 103 mmol/L (ref 98–111)
Creatinine, Ser: 1.27 mg/dL — ABNORMAL HIGH (ref 0.44–1.00)
GFR, Estimated: 48 mL/min — ABNORMAL LOW (ref >60)
Glucose, Bld: 107 mg/dL — ABNORMAL HIGH (ref 70–99)
Potassium: 4 mmol/L (ref 3.5–5.1)
Sodium: 140 mmol/L (ref 135–145)

## 2023-07-12 LAB — POC SARS CORONAVIRUS 2 AG -  ED: SARS Coronavirus 2 Ag: NEGATIVE

## 2023-07-12 LAB — POCT RAPID STREP A (OFFICE): Rapid Strep A Screen: NEGATIVE

## 2023-07-12 MED ORDER — DILTIAZEM HCL ER BEADS 240 MG PO CP24
360.0000 mg | ORAL_CAPSULE | Freq: Every day | ORAL | Status: DC
  Administered 2023-07-13 – 2023-07-14 (×2): 360 mg via ORAL
  Filled 2023-07-12 (×4): qty 1

## 2023-07-12 MED ORDER — ONDANSETRON HCL 4 MG PO TABS: 4.0000 mg | ORAL_TABLET | Freq: Four times a day (QID) | ORAL | Status: DC | PRN

## 2023-07-12 MED ORDER — LEVOTHYROXINE SODIUM 25 MCG PO TABS
25.0000 ug | ORAL_TABLET | Freq: Every day | ORAL | Status: DC
  Administered 2023-07-13 – 2023-07-14 (×2): 25 ug via ORAL
  Filled 2023-07-12 (×2): qty 1

## 2023-07-12 MED ORDER — ACETAMINOPHEN 650 MG RE SUPP: 650.0000 mg | Freq: Four times a day (QID) | RECTAL | Status: DC | PRN

## 2023-07-12 MED ORDER — SPIRONOLACTONE 25 MG PO TABS
25.0000 mg | ORAL_TABLET | Freq: Every day | ORAL | Status: DC
  Administered 2023-07-13 – 2023-07-14 (×2): 25 mg via ORAL
  Filled 2023-07-12 (×2): qty 1

## 2023-07-12 MED ORDER — ACETAMINOPHEN 325 MG PO TABS
650.0000 mg | ORAL_TABLET | Freq: Four times a day (QID) | ORAL | Status: DC | PRN
  Administered 2023-07-13 – 2023-07-14 (×2): 650 mg via ORAL
  Filled 2023-07-12 (×2): qty 2

## 2023-07-12 MED ORDER — GADOBUTROL 1 MMOL/ML IV SOLN
10.0000 mL | Freq: Once | INTRAVENOUS | Status: AC | PRN
  Administered 2023-07-12: 10 mL via INTRAVENOUS

## 2023-07-12 MED ORDER — IRBESARTAN 150 MG PO TABS
150.0000 mg | ORAL_TABLET | Freq: Every day | ORAL | Status: DC
  Administered 2023-07-13: 150 mg via ORAL
  Filled 2023-07-12: qty 1

## 2023-07-12 MED ORDER — ALBUTEROL SULFATE (2.5 MG/3ML) 0.083% IN NEBU: 2.5000 mg | INHALATION_SOLUTION | RESPIRATORY_TRACT | Status: DC | PRN

## 2023-07-12 MED ORDER — INSULIN ASPART 100 UNIT/ML IJ SOLN
0.0000 [IU] | Freq: Three times a day (TID) | INTRAMUSCULAR | Status: DC
  Administered 2023-07-13 (×2): 2 [IU] via SUBCUTANEOUS

## 2023-07-12 MED ORDER — ONDANSETRON HCL 4 MG/2ML IJ SOLN: 4.0000 mg | Freq: Four times a day (QID) | INTRAMUSCULAR | Status: DC | PRN

## 2023-07-12 MED ORDER — POLYETHYLENE GLYCOL 3350 17 G PO PACK
17.0000 g | PACK | Freq: Every day | ORAL | Status: DC
  Administered 2023-07-13 – 2023-07-14 (×2): 17 g via ORAL
  Filled 2023-07-12 (×2): qty 1

## 2023-07-12 MED ORDER — HEPARIN (PORCINE) 25000 UT/250ML-% IV SOLN
1300.0000 [IU]/h | INTRAVENOUS | Status: AC
  Administered 2023-07-13: 1000 [IU]/h via INTRAVENOUS
  Filled 2023-07-12: qty 250

## 2023-07-12 MED ORDER — PROMETHAZINE-DM 6.25-15 MG/5ML PO SYRP: 5.0000 mL | ORAL_SOLUTION | Freq: Four times a day (QID) | ORAL | 0 refills | Status: DC | PRN

## 2023-07-12 MED ORDER — MONTELUKAST SODIUM 10 MG PO TABS
10.0000 mg | ORAL_TABLET | Freq: Every day | ORAL | Status: DC
  Administered 2023-07-13: 10 mg via ORAL
  Filled 2023-07-12 (×2): qty 1

## 2023-07-12 MED ORDER — STROKE: EARLY STAGES OF RECOVERY BOOK
Freq: Once | Status: AC
  Filled 2023-07-12 (×2): qty 1

## 2023-07-12 MED ORDER — ROSUVASTATIN CALCIUM 5 MG PO TABS
10.0000 mg | ORAL_TABLET | Freq: Every day | ORAL | Status: DC
  Administered 2023-07-13: 10 mg via ORAL
  Filled 2023-07-12: qty 2

## 2023-07-12 MED ORDER — LORAZEPAM 2 MG/ML IJ SOLN
1.0000 mg | Freq: Once | INTRAMUSCULAR | Status: AC
  Administered 2023-07-12: 1 mg via INTRAVENOUS
  Filled 2023-07-12: qty 1

## 2023-07-12 MED ORDER — CYCLOSPORINE 0.05 % OP EMUL
1.0000 [drp] | Freq: Two times a day (BID) | OPHTHALMIC | Status: DC
  Administered 2023-07-13 – 2023-07-14 (×4): 1 [drp] via OPHTHALMIC
  Filled 2023-07-12 (×5): qty 30

## 2023-07-12 MED ORDER — ALBUTEROL SULFATE HFA 108 (90 BASE) MCG/ACT IN AERS: 2.0000 | INHALATION_SPRAY | RESPIRATORY_TRACT | Status: DC | PRN

## 2023-07-12 MED ORDER — AZELASTINE HCL 0.1 % NA SOLN
2.0000 | Freq: Two times a day (BID) | NASAL | 0 refills | Status: DC
Start: 2023-07-12 — End: 2023-09-21

## 2023-07-12 MED ORDER — AZELASTINE HCL 0.1 % NA SOLN
2.0000 | Freq: Two times a day (BID) | NASAL | Status: DC
  Administered 2023-07-13 – 2023-07-14 (×2): 2 via NASAL
  Filled 2023-07-12 (×2): qty 30

## 2023-07-12 NOTE — ED Notes (Signed)
 Pt denies any pain, numbness or tingling sensation in the left arm at this time. Pt was notified to let this nurse know if there are any changes.
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
## 2023-07-12 NOTE — Progress Notes (Signed)
 PHARMACY - ANTICOAGULATION CONSULT NOTE  Pharmacy Consult for heparin  Indication: atrial fibrillation  Allergies  Allergen Reactions   Apresoline  [Hydralazine ] Nausea Only   Latex    Other     Band aids discolor skin ekg pads irritate skin    Prednisone  Swelling    Patient states that she can take methylprednisolone  without complications    Patient Measurements: Height: 5' 7 (170.2 cm) Weight: 110.2 kg (243 lb) IBW/kg (Calculated) : 61.6 HEPARIN  DW (KG): 87  Vital Signs: Temp: 98.2 F (36.8 C) (07/06 1742) Temp Source: Oral (07/06 1742) BP: 116/60 (07/06 1845) Pulse Rate: 86 (07/06 1845)  Labs: Recent Labs    07/12/23 1919  HGB 12.5  HCT 37.9  PLT 191  CREATININE 1.27*    Estimated Creatinine Clearance: 58.7 mL/min (A) (by C-G formula based on SCr of 1.27 mg/dL (H)).   Medical History: Past Medical History:  Diagnosis Date   Anemia    Arthritis    Asthma    Atrial fibrillation (HCC) 2018   Back pain    BV (bacterial vaginosis) 05/23/2013   Constipation 11/21/2014   Coronary artery disease    Current use of long term anticoagulation 2018   Diabetes mellitus without complication (HCC)    Dysphagia 2011   Fibroids 03/13/2016   GERD (gastroesophageal reflux disease)    Goiter 2009   Hematochezia 2011   Hematuria 05/23/2013   Hyperlipidemia    Hypertension    Hypothyroidism    LGSIL of cervix of undetermined significance 03/04/2021   03/04/21 +HPV 16/other , will get colpo, per ASCCP guidelines, immediate CIN3+risjk is 5.65%   Migraines    PAF (paroxysmal atrial fibrillation) (HCC)    a. diagnosed in 11/2016 --> started on Xarelto  for anticoagulation   Pelvic pain in female 11/02/2013   Plantar fasciitis of right foot    Pre-diabetes    Sleep apnea    dont use cpap says causes sinus infection    Medications:  -Xarelto  20mg  PO every day (last dose 7/6 @ 18:30)  Assessment: 89 yoF presented to ED with episode of tingling in right arm found to  have multiple embolic strokes on MRI as well as brain metastasis not previously diagnosed. Pharmacy consulted to dose heparin .  -CBC stable -Given last dose of Xarelto  was 7/6 1800-1900 will start heparin  at time of next dose of Xarelto   Goal of Therapy:  Heparin  level 0.3-0.5 units/mL aPTT 66-85 seconds  Monitor platelets by anticoagulation protocol: Yes   Plan:  No bolus given Xarelto  and indication Start heparin  infusion at 1000 units/hr on 7/7 @1800  Check anti-Xa level and aPTT in 8 hours and daily while on heparin  Monitor with aPTTs until correlates with heparin  level Continue to monitor H&H and platelets  Lynwood Poplar, PharmD, BCPS Clinical Pharmacist 07/12/2023 11:18 PM

## 2023-07-12 NOTE — Assessment & Plan Note (Signed)
Calculated BMI is 38,0
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
## 2023-07-12 NOTE — Assessment & Plan Note (Addendum)
 Currently under radiation and chemotherapy.  Possible new Brain metastasis, will need Oncology consult once patient in [facility].  No mass effect, will hold on steroids for now.

## 2023-07-12 NOTE — Assessment & Plan Note (Signed)
 Continue with levothyroxine

## 2023-07-12 NOTE — ED Triage Notes (Addendum)
 Pt with pain that radiates down left arm with tingling earlier about 35 minutes ago, denies any tingling currently. Pt states with hx of tingling to right arm due to ministroke. Denies pain to left arm as well currently. Pt was seen at [facility] earlier today due to a sore throat. Pt with recent PAC placed to left chest due to lung CA.

## 2023-07-12 NOTE — Assessment & Plan Note (Signed)
Continue glucose cover and monitoring with insulin sliding scale. Continue statin therapy.  

## 2023-07-12 NOTE — Discharge Instructions (Signed)
 The COVID test and rapid strep test were negative.  A throat culture has been ordered.  You will be contacted if the pending test result is abnormal.  Your EKG also shows that you are in atrial fibrillation. Take medication as prescribed. Increase fluids and allow for plenty of rest. You may continue over-the-counter Tylenol  as needed for pain, fever, or general discomfort. Recommend warm salt water  gargles 3-4 times daily as needed for throat pain or discomfort.  You may also use over-the-counter Chloraseptic throat spray or throat lozenges while symptoms persist. For your cough, recommend continued use of your CPAP as this will provide the most humidified air for you.  Also recommend sleeping elevated on pillows while symptoms persist. Symptoms should improve over the next 5 to 7 days.  If symptoms fail to improve, or appear to be worsening, recommend follow-up with your primary care physician. Follow-up as needed.

## 2023-07-12 NOTE — Assessment & Plan Note (Addendum)
 Atrial flutter with variable block.  Continue rate control with diltiazem  and anticoagulation with IV heparin .  Continue telemetry monitoring
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
## 2023-07-12 NOTE — ED Triage Notes (Signed)
 Cough, sore throat off and on since July 2nd.  Took a home covid test 2 days ago and it was negative.  States has been having headaches off and on.

## 2023-07-12 NOTE — Assessment & Plan Note (Addendum)
 Continue blood pressure control with olmesartan  and spironolactone .

## 2023-07-12 NOTE — Assessment & Plan Note (Addendum)
 Patient with multiple embolic ischemic infarcts, likely due to atrial fibrillation in the setting of hypercoagulable state related to lung cancer.   Plan to continue neuro checks q 4 hrs Anticoagulation with IV heparin  per Neurology recommendations Will check echocardiogram with bubble study Check lower extremity US  to rule out deep vein thrombosis Further imaging studies per Neurology

## 2023-07-12 NOTE — ED Notes (Signed)
 Port is NOT accessed at this time. Pt currently has an IV 20 Right AC

## 2023-07-13 ENCOUNTER — Encounter: Payer: Self-pay | Admitting: Oncology

## 2023-07-13 ENCOUNTER — Ambulatory Visit

## 2023-07-13 ENCOUNTER — Inpatient Hospital Stay (HOSPITAL_COMMUNITY)

## 2023-07-13 ENCOUNTER — Other Ambulatory Visit

## 2023-07-13 ENCOUNTER — Inpatient Hospital Stay (HOSPITAL_BASED_OUTPATIENT_CLINIC_OR_DEPARTMENT_OTHER)

## 2023-07-13 ENCOUNTER — Ambulatory Visit (HOSPITAL_COMMUNITY)

## 2023-07-13 DIAGNOSIS — C3491 Malignant neoplasm of unspecified part of right bronchus or lung: Secondary | ICD-10-CM

## 2023-07-13 DIAGNOSIS — I6389 Other cerebral infarction: Secondary | ICD-10-CM | POA: Diagnosis not present

## 2023-07-13 DIAGNOSIS — I6340 Cerebral infarction due to embolism of unspecified cerebral artery: Secondary | ICD-10-CM

## 2023-07-13 DIAGNOSIS — I4891 Unspecified atrial fibrillation: Secondary | ICD-10-CM | POA: Diagnosis not present

## 2023-07-13 DIAGNOSIS — C3490 Malignant neoplasm of unspecified part of unspecified bronchus or lung: Secondary | ICD-10-CM

## 2023-07-13 DIAGNOSIS — R29701 NIHSS score 1: Secondary | ICD-10-CM

## 2023-07-13 DIAGNOSIS — I639 Cerebral infarction, unspecified: Secondary | ICD-10-CM

## 2023-07-13 LAB — ECHOCARDIOGRAM COMPLETE BUBBLE STUDY
Area-P 1/2: 3.39 cm2
Calc EF: 67.8 %
Height: 67 in
S' Lateral: 3.6 cm
Single Plane A2C EF: 71.2 %
Single Plane A4C EF: 61 %
Weight: 3888 [oz_av]

## 2023-07-13 LAB — BASIC METABOLIC PANEL WITH GFR
Anion gap: 12 (ref 5–15)
BUN: 20 mg/dL (ref 8–23)
CO2: 23 mmol/L (ref 22–32)
Calcium: 9.7 mg/dL (ref 8.9–10.3)
Chloride: 105 mmol/L (ref 98–111)
Creatinine, Ser: 1.24 mg/dL — ABNORMAL HIGH (ref 0.44–1.00)
GFR, Estimated: 49 mL/min — ABNORMAL LOW (ref >60)
Glucose, Bld: 137 mg/dL — ABNORMAL HIGH (ref 70–99)
Potassium: 4.2 mmol/L (ref 3.5–5.1)
Sodium: 140 mmol/L (ref 135–145)

## 2023-07-13 LAB — GLUCOSE, CAPILLARY
Glucose-Capillary: 106 mg/dL — ABNORMAL HIGH (ref 70–99)
Glucose-Capillary: 140 mg/dL — ABNORMAL HIGH (ref 70–99)
Glucose-Capillary: 126 mg/dL — ABNORMAL HIGH (ref 70–99)

## 2023-07-13 LAB — LIPID PANEL
Cholesterol: 161 mg/dL (ref 0–200)
HDL: 49 mg/dL (ref >40)
LDL Cholesterol: 94 mg/dL (ref 0–99)
Total CHOL/HDL Ratio: 3.3 ratio
Triglycerides: 89 mg/dL (ref <150)
VLDL: 18 mg/dL (ref 0–40)

## 2023-07-13 LAB — CBC
HCT: 34.7 % — ABNORMAL LOW (ref 36.0–46.0)
Hemoglobin: 11.7 g/dL — ABNORMAL LOW (ref 12.0–15.0)
MCH: 30.8 pg (ref 26.0–34.0)
MCHC: 33.7 g/dL (ref 30.0–36.0)
MCV: 91.3 fL (ref 80.0–100.0)
Platelets: 182 K/uL (ref 150–400)
RBC: 3.8 MIL/uL — ABNORMAL LOW (ref 3.87–5.11)
RDW: 12.7 % (ref 11.5–15.5)
WBC: 6.5 K/uL (ref 4.0–10.5)
nRBC: 0 % (ref 0.0–0.2)

## 2023-07-13 MED ORDER — METHOCARBAMOL 500 MG PO TABS
500.0000 mg | ORAL_TABLET | Freq: Three times a day (TID) | ORAL | Status: DC | PRN
  Administered 2023-07-13: 500 mg via ORAL
  Filled 2023-07-13: qty 1

## 2023-07-13 MED ORDER — IOHEXOL 350 MG/ML SOLN
75.0000 mL | Freq: Once | INTRAVENOUS | Status: AC | PRN
  Administered 2023-07-13: 75 mL via INTRAVENOUS

## 2023-07-13 MED ORDER — LORAZEPAM 2 MG/ML IJ SOLN
1.0000 mg | Freq: Once | INTRAMUSCULAR | Status: AC | PRN
  Administered 2023-07-13: 1 mg via INTRAVENOUS
  Filled 2023-07-13: qty 1

## 2023-07-13 MED ORDER — GADOBUTROL 1 MMOL/ML IV SOLN
10.0000 mL | Freq: Once | INTRAVENOUS | Status: AC | PRN
  Administered 2023-07-13: 10 mL via INTRAVENOUS
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
## 2023-07-13 NOTE — Progress Notes (Addendum)
 PROGRESS NOTE  Becky [name] FMW:[id number] DOB: [birth year] DOA: 07/12/2023 PCP: [name], MD   LOS: 1 day   Brief Narrative / Interim history: 63 yo F with A-fib, prior CVA, HTN, hypothyroidism, recently diagnosed non-small cell lung cancer comes into the hospital with intermittent headaches, also left upper extremity paresthesias coming and going.  No other significant weakness or issues.  She is on Xarelto  for her A-fib, but held that for 2 days prior to the port implantation 07/06/2023.  She initially presented to [facility], MRI was positive for CVA, neurology was consulted and she was transferred to [facility]  Subjective / 24h Interval events: She is doing well this morning, daughter is at bedside.  She denies any chest pain, shortness of breath.  No current weakness/numbness, tingling.  She is feeling back to baseline  Assesement and Plan: Principal Problem:   Acute CVA (cerebrovascular accident) [facility]) Active Problems:   Atrial fibrillation, chronic (HCC)   Essential hypertension   Hypothyroidism   Type 2 diabetes mellitus with hyperlipidemia (HCC)   Non-small cell carcinoma of lung (HCC)   Obesity, class 2  Principal problem Acute CVA -MRI of the brain showed acute/subacute infarct likely of embolic etiology throughout bilateral hemispheres and left cerebellum.  Neurology consulted, appreciate input - She was off Xarelto  for 2 days about a week ago, it appears that has been having symptoms since. Discussed with Dr Jerrie with neurology this morning. - Case discussed with Dr Odean oncology on call, would not consider this failure of Xarelto  since she has been off of it for little while and plan is to resume Xarelto  at discharge  Active problems Non-small cell lung cancer -just diagnosed earlier this year, establish care with Dr. Gatha.  She had port placed about a week ago, was scheduled to receive chemotherapy starting today. - MRI of the brain, in addition to CVA  also showed numerous enhancing lesions concerning for intracranial metastatic disease.  I have let Dr. Gatha and Dr. Shannon know  A-fib, chronic -continue rate control with diltiazem , currently on heparin  drip  Hypothyroidism-continue Synthroid   Hyperlipidemia-continue statin  Obesity, class II-she would benefit from weight loss  Scheduled Meds:  azelastine   2 spray Each Nare BID   cycloSPORINE   1 drop Both Eyes BID   diltiazem   360 mg Oral Daily   insulin  aspart  0-15 Units Subcutaneous TID WC   irbesartan   150 mg Oral Daily   levothyroxine   25 mcg Oral Daily   montelukast   10 mg Oral QHS   polyethylene glycol  17 g Oral Daily   rosuvastatin   10 mg Oral Daily   spironolactone   25 mg Oral Daily   Continuous Infusions:  heparin      PRN Meds:.acetaminophen  **OR** acetaminophen , albuterol , ondansetron  **OR** ondansetron  (ZOFRAN ) IV  Current Outpatient Medications  Medication Instructions   acetaminophen  (TYLENOL ) 1,000 mg, Every 6 hours PRN   albuterol  (VENTOLIN  HFA) 108 (90 Base) MCG/ACT inhaler 2 puffs, Inhalation, Every 4 hours PRN   azelastine  (ASTELIN ) 0.1 % nasal spray 2 sprays, Each Nare, 2 times daily, Use in each nostril as directed   dexlansoprazole  (DEXILANT ) 60 mg, Oral, Daily   diltiazem  (TIAZAC ) 360 mg, Daily   EPINEPHrine  (EPI-PEN) 0.3 mg, Once PRN   folic acid  (FOLVITE ) 1 mg, Oral, Daily   glimepiride (AMARYL) 2 mg, Daily with breakfast   KLOR-CON  M20 20 MEQ tablet 20 mEq, Daily   lidocaine -prilocaine  (EMLA ) cream Apply to affected area once   linaclotide  (LINZESS ) 72 mcg,  Oral, Daily before breakfast   montelukast  (SINGULAIR ) 10 mg, Oral, Daily at bedtime   NON FORMULARY Pt uses a cpap nightly   olmesartan  (BENICAR ) 40 mg, Oral, Daily   ondansetron  (ZOFRAN ) 8 mg, Oral, Every 8 hours PRN, Start on the third day after chemotherapy.   ondansetron  (ZOFRAN -ODT) 4 mg, Every 6 hours PRN   polyethylene glycol (MIRALAX ) 17 g, Oral, Daily   prochlorperazine   (COMPAZINE ) 10 mg, Oral, Every 6 hours PRN   promethazine -dextromethorphan (PROMETHAZINE -DM) 6.25-15 MG/5ML syrup 5 mLs, Oral, 4 times daily PRN   RESTASIS  0.05 % ophthalmic emulsion 1 drop, 2 times daily   rivaroxaban  (XARELTO ) 20 mg, Oral, Daily   rosuvastatin  (CRESTOR ) 10 mg, Oral, Daily   spironolactone  (ALDACTONE ) 25 mg, Oral, Daily   Synthroid  25 mcg, Daily   traMADol  (ULTRAM ) 25 mg, Every 6 hours PRN    Diet Orders (From admission, onward)     Start     Ordered   07/13/23 0040  Diet heart healthy/carb modified Room service appropriate? Yes; Fluid consistency: Thin  Diet effective now       Question Answer Comment  Diet-HS Snack? Nothing   Room service appropriate? Yes   Fluid consistency: Thin      07/13/23 0039            DVT prophylaxis: SCDs Start: 07/12/23 2341   Lab Results  Component Value Date   PLT 182 07/13/2023      Code Status: Full Code  Family Communication: Daughter at bedside  Status is: Inpatient Remains inpatient appropriate because: Severity of illness  Level of care: Telemetry Medical  Consultants:  Neurology  Objective: Vitals:   07/13/23 0700 07/13/23 0750 07/13/23 0920 07/13/23 1045  BP: (!) 112/59  (!) 158/84 (!) 165/78  Pulse: 82  95 75  Resp: (!) 21  18 19   Temp:  98 F (36.7 C) 98.3 F (36.8 C) 98.2 F (36.8 C)  TempSrc:  Axillary Oral Oral  SpO2: 98%  99% 99%  Weight:      Height:       No intake or output data in the 24 hours ending 07/13/23 1110 Wt Readings from Last 3 Encounters:  07/12/23 110.2 kg  07/06/23 111.1 kg  07/01/23 113.3 kg    Examination:  Constitutional: NAD Eyes: no scleral icterus ENMT: Mucous membranes are moist.  Neck: normal, supple Respiratory: clear to auscultation bilaterally, no wheezing, no crackles. Cardiovascular: Regular rate and rhythm, no murmurs / rubs / gallops. No LE edema.  Abdomen: non distended, no tenderness. Bowel sounds positive.  Musculoskeletal: no clubbing /  cyanosis.    Data Reviewed: I have independently reviewed following labs and imaging studies   CBC Recent Labs  Lab 07/12/23 1919 07/13/23 0400  WBC 6.9 6.5  HGB 12.5 11.7*  HCT 37.9 34.7*  PLT 191 182  MCV 90.9 91.3  MCH 30.0 30.8  MCHC 33.0 33.7  RDW 12.6 12.7  LYMPHSABS 1.9  --   MONOABS 0.8  --   EOSABS 0.2  --   BASOSABS 0.0  --     Recent Labs  Lab 07/12/23 1919 07/13/23 0400  NA 140 140  K 4.0 4.2  CL 103 105  CO2 25 23  GLUCOSE 107* 137*  BUN 18 20  CREATININE 1.27* 1.24*  CALCIUM  9.8 9.7    ------------------------------------------------------------------------------------------------------------------ Recent Labs    07/13/23 0400  CHOL 161  HDL 49  LDLCALC 94  TRIG 89  CHOLHDL 3.3    Lab  Results  Component Value Date   HGBA1C 5.9 (H) 04/07/2023   ------------------------------------------------------------------------------------------------------------------ No results for input(s): TSH, T4TOTAL, T3FREE, THYROIDAB in the last 72 hours.  Invalid input(s): FREET3  Cardiac Enzymes No results for input(s): CKMB, TROPONINI, MYOGLOBIN in the last 168 hours.  Invalid input(s): CK ------------------------------------------------------------------------------------------------------------------    Component Value Date/Time   BNP 80.0 02/12/2023 1017    CBG: Recent Labs  Lab 07/06/23 1228 07/06/23 1506 07/13/23 0947  GLUCAP 112* 98 106*    No results found for this or any previous visit (from the past 240 hours).   Radiology Studies: MR BRAIN W WO CONTRAST Result Date: 07/12/2023 CLINICAL DATA:  Neuro deficit, concern for stroke. Pain radiating down left arm with tingling. EXAM: MRI HEAD WITHOUT AND WITH CONTRAST TECHNIQUE: Multiplanar, multiecho pulse sequences of the brain and surrounding structures were obtained without and with intravenous contrast. CONTRAST:  10mL GADAVIST  GADOBUTROL  1 MMOL/ML IV SOLN COMPARISON:   CT head 04/14/2023.  MRI head 04/07/2023. FINDINGS: Brain: There are numerous peripherally enhancing lesions throughout the cerebral hemispheres and cerebellum. Reference lesions described as follows: 9 mm left cerebellar lesion (series 18, image 53) 5 mm lesion involving the superior aspect of the right middle cerebellar peduncle (image 65). 5 mm anterior right frontal lesion (image 94). 6 mm left frontal operculum (image 96) 5 mm parasagittal left frontal lesion near the vertex (image 146) Multiple lesions demonstrate diffusion signal abnormality. There is edema surrounding the left cerebellar lesion with mild local mass effect. Additional mild focus of edema within the inferior right cerebellum without associated enhancing lesion. Numerous additional foci of diffusion signal abnormality in the cerebral hemispheres and left cerebellum without definite ADC signal abnormality which may reflect late acute versus subacute infarcts. 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 No midline shift. Small chronic microhemorrhage in the lateral left occipital lobe. Scattered T2/FLAIR hyperintensity in the supratentorial white matter. The basilar cisterns are patent. No extra-axial fluid collections. Ventricles: Normal size and configuration of the ventricles. Vascular: Skull base flow voids are visualized. Skull and upper cervical spine: No focal abnormality. Sinuses/Orbits: Orbits are symmetric. Paranasal sinuses are clear. Other: Mastoid air cells are clear. IMPRESSION: Numerous enhancing lesions in the cerebral hemispheres and cerebellum concerning for intracranial metastatic disease. Dominant lesion in the left cerebellum demonstrates surrounding enhancement and mild local mass effect. No midline shift. Scattered foci of diffusion signal abnormality throughout the cerebral hemispheres and left cerebellum some of which correspond to enhancing lesions. However the majority of these foci likely reflect acute/subacute  infarct likely of embolic etiology. Mild chronic microvascular ischemic changes. 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 Electronically Signed   By: Donnice Mania M.D.   On: 07/12/2023 21:02   DG Chest Portable 1 View Result Date: 07/12/2023 CLINICAL DATA:  Chest pain EXAM: PORTABLE CHEST 1 VIEW COMPARISON:  06/04/2023, CT 05/28/2023 FINDINGS: Left-sided central venous port tip over the SVC. Low lung volumes. Borderline cardiac enlargement. Vague right upper lobe opacity, presumably corresponds to history of CT demonstrated lung mass. IMPRESSION: Low lung volumes. Vague right upper lobe opacity, presumably corresponds to history of CT demonstrated lung mass. Electronically Signed   By: Luke Bun M.D.   On: 07/12/2023 19:14     Nilda Fendt, MD, PhD [facility]  Between 7 am - 7 pm I am available, please contact me via Amion (for emergencies) or Securechat (non urgent messages)  Between 7 pm - 7 am I am not available, please contact night coverage MD/APP via  Amion

## 2023-07-13 NOTE — Evaluation (Signed)
 Occupational Therapy Evaluation and DC Summary   Patient Details Name: Becky [name] MRN: [id number] DOB: 1960-07-15 Today's Date: 07/13/2023   History of Present Illness   63 yo female presents to [facility] on 07/12/23 from [facility] for further CVA workup c/o LUE numbness. MRI demonstrated acute/subacute infarct likely of embolic etiology throughout bilateral hemispheres and left cerebellum . PMH includes asthma, OA, GERD, HTN, HLD, R TKA, migraines, OSA.     Clinical Impressions Pt admitted for above, PTA pt reports being ind with her ADLs/iADLs and has good family support at home. Pt currently presenting close to functional baseline, she is without any particular coordination deficits and her strength seems to be baseline with her L>R. Pt vision/perception intact, completing ADLs with supervision/setup A for safety and ambulating with supervision + rollator. Educated pt on BeFAST CVA education, she has no further acute skilled OT needs. No post acute OT recommended.      If plan is discharge home, recommend the following:   Other (comment) (prn)     Functional Status Assessment   Patient has not had a recent decline in their functional status     Equipment Recommendations   None recommended by OT     Recommendations for Other Services         Precautions/Restrictions   Precautions Precautions: Fall (low fall risk) Recall of Precautions/Restrictions: Intact Restrictions Weight Bearing Restrictions Per Provider Order: No     Mobility Bed Mobility Overal bed mobility: Modified Independent                  Transfers Overall transfer level: Needs assistance   Transfers: Sit to/from Stand Sit to Stand: Supervision           General transfer comment: supervision for safety, no AD. pt ambulating hallway with CGA no AD, has a limp from her report of L knee needing an replacement. Supervision for gait with rollator.      Balance Overall balance assessment:  Mild deficits observed, not formally tested                                         ADL either performed or assessed with clinical judgement   ADL Overall ADL's : Needs assistance/impaired Eating/Feeding: Independent;Sitting   Grooming: Standing;Supervision/safety   Upper Body Bathing: Supervision/ safety;Standing   Lower Body Bathing: Supervison/ safety;Sitting/lateral leans   Upper Body Dressing : Set up;Sitting Upper Body Dressing Details (indicate cue type and reason): doff/don don gown like jacket Lower Body Dressing: Sitting/lateral leans;Set up   Toilet Transfer: Rollator (4 wheels);Ambulation;Supervision/safety   Toileting- Clothing Manipulation and Hygiene: Supervision/safety;Sit to/from stand       Functional mobility during ADLs: Supervision/safety;Rollator (4 wheels)       Vision Baseline Vision/History: 1 Wears glasses Ability to See in Adequate Light: 0 Adequate Patient Visual Report: No change from baseline Vision Assessment?: Yes Ocular Range of Motion: Within Functional Limits Alignment/Gaze Preference: Within Defined Limits Tracking/Visual Pursuits: Able to track stimulus in all quads without difficulty Saccades: Within functional limits Convergence: Within functional limits Visual Fields: No apparent deficits Diplopia Assessment:  (n/a) Depth Perception:  (n/a)     Perception Perception: Within Functional Limits       Praxis Praxis: WFL       Pertinent Vitals/Pain Pain Assessment Pain Assessment: No/denies pain     Extremity/Trunk Assessment Upper Extremity Assessment Upper Extremity Assessment: Overall [facility]  for tasks assessed;Right hand dominant (RUE with weaker grip and 3+/5 MMT. She reports hx of rotator injury but never to got to finish sx. Mild incoordination with LUE FTN, shoots to the R of target)   Lower Extremity Assessment Lower Extremity Assessment: Overall WFL for tasks assessed   Cervical / Trunk  Assessment Cervical / Trunk Assessment: Normal   Communication Communication Communication: No apparent difficulties   Cognition Arousal: Alert Behavior During Therapy: WFL for tasks assessed/performed Cognition: No apparent impairments                               Following commands: Intact       Cueing  General Comments   Cueing Techniques: Verbal cues  VSS, HR 105 bpm with activity. Pt in constant afib. Sp02 98% throughout session despite pt wheezing   Exercises     Shoulder Instructions      Home Living Family/patient expects to be discharged to:: Private residence Living Arrangements: Spouse/significant other;Children (daughter and spouse) Available Help at Discharge: Family;Available 24 hours/day Type of Home: Mobile home Home Access: Stairs to enter Entrance Stairs-Number of Steps: 4 to 5 Entrance Stairs-Rails: Can reach both;Right;Left Home Layout: One level     Bathroom Shower/Tub: Producer, television/film/video: Standard Bathroom Accessibility: Yes How Accessible: Accessible via walker Home Equipment: Rolling Walker (2 wheels);Cane - single point;Rollator (4 wheels);BSC/3in1;Shower seat   Additional Comments: pt denies any falls  Lives With: Spouse;Daughter    Prior Functioning/Environment Prior Level of Function : Independent/Modified Independent;Driving             Mobility Comments: ind in home, Rollator in the community. ADLs Comments: Independent    OT Problem List: Other (comment) (resolved deficits.)   OT Treatment/Interventions:        OT Goals(Current goals can be found in the care plan section)   Acute Rehab OT Goals Patient Stated Goal: Get to cause of CVAs OT Goal Formulation: All assessment and education complete, DC therapy Time For Goal Achievement: 07/27/23 Potential to Achieve Goals: Good   OT Frequency:       Co-evaluation              AM-PAC OT 6 Clicks Daily Activity     Outcome  Measure Help from another person eating meals?: None Help from another person taking care of personal grooming?: A Little Help from another person toileting, which includes using toliet, bedpan, or urinal?: A Little Help from another person bathing (including washing, rinsing, drying)?: A Little Help from another person to put on and taking off regular upper body clothing?: A Little Help from another person to put on and taking off regular lower body clothing?: A Little 6 Click Score: 19   End of Session Equipment Utilized During Treatment: Gait belt;Rollator (4 wheels) Nurse Communication: Mobility status  Activity Tolerance: Patient tolerated treatment well Patient left: in bed;with call bell/phone within reach;with family/visitor present  OT Visit Diagnosis: Other symptoms and signs involving the nervous system (R29.898)                Time: 1520-1600 OT Time Calculation (min): 40 min Charges:  OT General Charges $OT Visit: 1 Visit OT Evaluation $OT Eval Low Complexity: 1 Low OT Treatments $Therapeutic Activity: 8-22 mins  07/13/2023  AB, OTR/L  Acute Rehabilitation Services  Office: [phone]   Curtistine JONETTA Das 07/13/2023, 4:24 PM

## 2023-07-13 NOTE — Progress Notes (Signed)
  Echocardiogram 2D Echocardiogram has been performed.  Becky [name] 07/13/2023, 3:05 PM

## 2023-07-13 NOTE — Progress Notes (Addendum)
 ASSUMPTION OF CARE NOTE   07/13/2023 9:43 AM  Becky [name] was seen and examined.  The H&P by the admitting provider, orders, imaging was reviewed.  Please see new orders.  Will continue to follow.  Pt seen briefly while being transported out of building to [facility].  Fortunately her neurological symptoms are nearly completely resolved. Further work up when she arrives at [facility].  She is transferring out in stable condition.   Vitals:   07/13/23 0750 07/13/23 0920  BP:  (!) 158/84  Pulse:  95  Resp:  18  Temp: 98 F (36.7 C) 98.3 F (36.8 C)  SpO2:  99%   Physical Exam Vitals and nursing note reviewed. Exam conducted with a chaperone present.  Constitutional:      Appearance: Normal appearance.  HENT:     Head: Normocephalic and atraumatic.     Nose: Nose normal.  Eyes:     Extraocular Movements: Extraocular movements intact.     Pupils: Pupils are equal, round, and reactive to light.  Cardiovascular:     Rate and Rhythm: Normal rate.  Pulmonary:     Effort: Pulmonary effort is normal.  Musculoskeletal:     Cervical back: Normal range of motion.  Skin:    General: Skin is warm.  Neurological:     Mental Status: She is alert and oriented to person, place, and time.     Cranial Nerves: No cranial nerve deficit.     Sensory: No sensory deficit.     Results for orders placed or performed during the hospital encounter of 07/12/23  Basic metabolic panel   Collection Time: 07/12/23  7:19 PM  Result Value Ref Range   Sodium 140 135 - 145 mmol/L   Potassium 4.0 3.5 - 5.1 mmol/L   Chloride 103 98 - 111 mmol/L   CO2 25 22 - 32 mmol/L   Glucose, Bld 107 (H) 70 - 99 mg/dL   BUN 18 8 - 23 mg/dL   Creatinine, Ser 8.72 (H) 0.44 - 1.00 mg/dL   Calcium  9.8 8.9 - 10.3 mg/dL   GFR, Estimated 48 (L) >60 mL/min   Anion gap 12 5 - 15  CBC with Differential   Collection Time: 07/12/23  7:19 PM  Result Value Ref Range   WBC 6.9 4.0 - 10.5 K/uL   RBC 4.17 3.87 - 5.11 MIL/uL   Hemoglobin  12.5 12.0 - 15.0 g/dL   HCT 62.0 63.9 - 53.9 %   MCV 90.9 80.0 - 100.0 fL   MCH 30.0 26.0 - 34.0 pg   MCHC 33.0 30.0 - 36.0 g/dL   RDW 87.3 88.4 - 84.4 %   Platelets 191 150 - 400 K/uL   nRBC 0.0 0.0 - 0.2 %   Neutrophils Relative % 58 %   Neutro Abs 4.0 1.7 - 7.7 K/uL   Lymphocytes Relative 28 %   Lymphs Abs 1.9 0.7 - 4.0 K/uL   Monocytes Relative 11 %   Monocytes Absolute 0.8 0.1 - 1.0 K/uL   Eosinophils Relative 3 %   Eosinophils Absolute 0.2 0.0 - 0.5 K/uL   Basophils Relative 0 %   Basophils Absolute 0.0 0.0 - 0.1 K/uL   Immature Granulocytes 0 %   Abs Immature Granulocytes 0.02 0.00 - 0.07 K/uL  Basic metabolic panel   Collection Time: 07/13/23  4:00 AM  Result Value Ref Range   Sodium 140 135 - 145 mmol/L   Potassium 4.2 3.5 - 5.1 mmol/L   Chloride 105 98 -  111 mmol/L   CO2 23 22 - 32 mmol/L   Glucose, Bld 137 (H) 70 - 99 mg/dL   BUN 20 8 - 23 mg/dL   Creatinine, Ser 8.75 (H) 0.44 - 1.00 mg/dL   Calcium  9.7 8.9 - 10.3 mg/dL   GFR, Estimated 49 (L) >60 mL/min   Anion gap 12 5 - 15  CBC   Collection Time: 07/13/23  4:00 AM  Result Value Ref Range   WBC 6.5 4.0 - 10.5 K/uL   RBC 3.80 (L) 3.87 - 5.11 MIL/uL   Hemoglobin 11.7 (L) 12.0 - 15.0 g/dL   HCT 65.2 (L) 63.9 - 53.9 %   MCV 91.3 80.0 - 100.0 fL   MCH 30.8 26.0 - 34.0 pg   MCHC 33.7 30.0 - 36.0 g/dL   RDW 87.2 88.4 - 84.4 %   Platelets 182 150 - 400 K/uL   nRBC 0.0 0.0 - 0.2 %  Lipid panel   Collection Time: 07/13/23  4:00 AM  Result Value Ref Range   Cholesterol 161 0 - 200 mg/dL   Triglycerides 89 <849 mg/dL   HDL 49 >59 mg/dL   Total CHOL/HDL Ratio 3.3 RATIO   VLDL 18 0 - 40 mg/dL   LDL Cholesterol 94 0 - 99 mg/dL   *Note: Due to a large number of results and/or encounters for the requested time period, some results have not been displayed. A complete set of results can be found in Results Review.   Principal Problem:   Acute CVA (cerebrovascular accident) [facility]) Active Problems:    Hypothyroidism   Type 2 diabetes mellitus with hyperlipidemia (HCC)   Essential hypertension   Atrial fibrillation, chronic (HCC)   Non-small cell carcinoma of lung (HCC)   Obesity, class 2  Assessment and Plan:  Acute CVA (cerebrovascular accident) Brain Metastases  Patient with multiple embolic ischemic infarcts, likely due to atrial fibrillation in the setting of hypercoagulable state related to lung cancer.  Agree with plan to continue neuro checks q 4 hrs Anticoagulation with IV heparin  per Neurology recommendations Neuro on-call requested patient transfer to [facility] Follow up 2D echocardiogram with bubble study Follow up lower extremity US  to rule out deep vein thrombosis Further imaging studies per Neurology   Obesity, class 2 Calculated BMI is 38.0   Non-small cell carcinoma of lung Currently under radiation and chemotherapy.  Possible new Brain metastasis, will need Oncology consult once patient in [facility].  No mass effect, steroids held on admission   Atrial fibrillation, chronic Atrial flutter with variable block.  Continue rate control with diltiazem  and anticoagulation with IV heparin .  Continue telemetry monitoring   Essential hypertension Continue blood pressure control with olmesartan  and spironolactone .   Type 2 diabetes mellitus with hyperlipidemia Continue glucose cover and monitoring with insulin  sliding scale.  Continue statin therapy  CBG (last 3)  Recent Labs    07/13/23 0947 07/13/23 1243  GLUCAP 106* 140*   Hypothyroidism Continue with levothyroxine    PATIENT IS TRANSFERRING TO [facility] FOR CARE - ARRANGED BY ADMITTING MD DISCUSSED WITH DR. GHERGHE VIA SECURE CHAT WHO WILL SEE AT [facility] KYM Louder, MD [facility]   07/12/2023  5:42 PM How to contact the [facility] Attending or Consulting provider 7A - 7P or covering provider during after hours 7P -7A, for this patient?  Check the care team in [facility] and look for a) attending/consulting [facility] provider listed and  b) the [facility] team listed Log into [email or web address] and use [place]'s universal password  to access. If you do not have the password, please contact the hospital operator. Locate the [facility] provider you are looking for under [facility] and page to a number that you can be directly reached. If you still have difficulty reaching the provider, please page the [facility] (Director on Call) for the Hospitalists listed on amion for assistance.

## 2023-07-13 NOTE — Progress Notes (Signed)
 PT Cancellation Note  Patient Details Name: Becky [name] MRN: [id number] DOB: 1960/05/21   Cancelled Treatment:    Reason Eval/Treat Not Completed: PT screened, no needs identified, will sign off (Per OT, Pt is mobilizing well and has no acute PT needs.)   Shota Kohrs 07/13/2023, 4:52 PM
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
## 2023-07-13 NOTE — Hospital Course (Signed)
 63 y.o. female with medical history significant of non small cell lung cancer, asthma, atrial fibrillation, GERD, T2DM, CVA, hypertension, hypothyroidism, and obesity who presented with left upper extremity pain and paresthesias.  For the last week she has been experiencing headaches and left upper extremity paresthesias, intermittent in nature, moderate in intensity, with no triggering factors, no improving or worsening factors. Today her left upper extremity paresthesias were accompanied of pain, prompting her to come to the ED.  She denies any focal weakness, any difficulty coordinating her upper/ lower extremities, no changes in her speech or her ability to communicate.  Her balance has been off for several years since her past stroke.  She has been taking rivaroxaban  every day, except 2 days prior the implantation of left internal jugular vein port 07/06/23.    Currently she is under chemo radiation therapy under the care of Dr Sherrod.

## 2023-07-14 ENCOUNTER — Ambulatory Visit

## 2023-07-14 ENCOUNTER — Encounter: Payer: Self-pay | Admitting: Internal Medicine

## 2023-07-14 DIAGNOSIS — R9089 Other abnormal findings on diagnostic imaging of central nervous system: Secondary | ICD-10-CM

## 2023-07-14 DIAGNOSIS — I6340 Cerebral infarction due to embolism of unspecified cerebral artery: Secondary | ICD-10-CM | POA: Diagnosis not present

## 2023-07-14 DIAGNOSIS — I639 Cerebral infarction, unspecified: Secondary | ICD-10-CM | POA: Diagnosis not present

## 2023-07-14 DIAGNOSIS — C3490 Malignant neoplasm of unspecified part of unspecified bronchus or lung: Secondary | ICD-10-CM | POA: Diagnosis not present

## 2023-07-14 LAB — GLUCOSE, CAPILLARY
Glucose-Capillary: 107 mg/dL — ABNORMAL HIGH (ref 70–99)
Glucose-Capillary: 102 mg/dL — ABNORMAL HIGH (ref 70–99)

## 2023-07-14 LAB — CBC
HCT: 33.4 % — ABNORMAL LOW (ref 36.0–46.0)
Hemoglobin: 11.2 g/dL — ABNORMAL LOW (ref 12.0–15.0)
MCH: 30.2 pg (ref 26.0–34.0)
MCHC: 33.5 g/dL (ref 30.0–36.0)
MCV: 90 fL (ref 80.0–100.0)
Platelets: 187 K/uL (ref 150–400)
RBC: 3.71 MIL/uL — ABNORMAL LOW (ref 3.87–5.11)
RDW: 12.7 % (ref 11.5–15.5)
WBC: 5.3 K/uL (ref 4.0–10.5)
nRBC: 0 % (ref 0.0–0.2)

## 2023-07-14 LAB — HEPARIN LEVEL (UNFRACTIONATED)
Heparin Unfractionated: >1.1 [IU]/mL — ABNORMAL HIGH (ref 0.30–0.70)
Heparin Unfractionated: >1.1 [IU]/mL — ABNORMAL HIGH (ref 0.30–0.70)

## 2023-07-14 LAB — APTT
aPTT: 35 s (ref 24–36)
aPTT: 57 s — ABNORMAL HIGH (ref 24–36)

## 2023-07-14 MED ORDER — RIVAROXABAN 20 MG PO TABS: 20.0000 mg | ORAL_TABLET | Freq: Every day | ORAL | Status: DC

## 2023-07-14 MED ORDER — ROSUVASTATIN CALCIUM 20 MG PO TABS: 20.0000 mg | ORAL_TABLET | Freq: Every day | ORAL | 0 refills | Status: DC

## 2023-07-14 MED ORDER — ROSUVASTATIN CALCIUM 20 MG PO TABS: 20.0000 mg | ORAL_TABLET | Freq: Every day | ORAL | Status: DC

## 2023-07-14 NOTE — Progress Notes (Signed)
 STROKE TEAM PROGRESS NOTE   SUBJECTIVE (INTERVAL HISTORY) Her son is at the bedside.  Overall her condition is nearly resolved. Still complaining mild numbness of LUE but no more pain.    OBJECTIVE Temp:  [97.4 F (36.3 C)-98.3 F (36.8 C)] 97.6 F (36.4 C) (07/08 1200) Pulse Rate:  [61-94] 94 (07/08 1200) Cardiac Rhythm: Atrial fibrillation (07/08 0700) Resp:  [18-19] 19 (07/08 1200) BP: (93-146)/(55-96) 137/76 (07/08 1200) SpO2:  [96 %-100 %] 99 % (07/08 1200)  Recent Labs  Lab 07/13/23 0947 07/13/23 1243 07/13/23 1628 07/14/23 0630 07/14/23 1158  GLUCAP 106* 140* 126* 107* 102*   Recent Labs  Lab 07/12/23 1919 07/13/23 0400  NA 140 140  K 4.0 4.2  CL 103 105  CO2 25 23  GLUCOSE 107* 137*  BUN 18 20  CREATININE 1.27* 1.24*  CALCIUM  9.8 9.7   No results for input(s): AST, ALT, ALKPHOS, BILITOT, PROT, ALBUMIN in the last 168 hours. Recent Labs  Lab 07/12/23 1919 07/13/23 0400 07/14/23 0833  WBC 6.9 6.5 5.3  NEUTROABS 4.0  --   --   HGB 12.5 11.7* 11.2*  HCT 37.9 34.7* 33.4*  MCV 90.9 91.3 90.0  PLT 191 182 187   No results for input(s): CKTOTAL, CKMB, CKMBINDEX, TROPONINI in the last 168 hours. No results for input(s): LABPROT, INR in the last 72 hours. No results for input(s): COLORURINE, LABSPEC, PHURINE, GLUCOSEU, HGBUR, BILIRUBINUR, KETONESUR, PROTEINUR, UROBILINOGEN, NITRITE, LEUKOCYTESUR in the last 72 hours.  Invalid input(s): APPERANCEUR     Component Value Date/Time   CHOL 161 07/13/2023 0400   CHOL 231 (H) 01/01/2017 1139   TRIG 89 07/13/2023 0400   HDL 49 07/13/2023 0400   HDL 57 01/01/2017 1139   CHOLHDL 3.3 07/13/2023 0400   VLDL 18 07/13/2023 0400   LDLCALC 94 07/13/2023 0400   LDLCALC 154 (H) 01/01/2017 1139   Lab Results  Component Value Date   HGBA1C 5.9 (H) 04/07/2023      Component Value Date/Time   LABOPIA NONE DETECTED 04/07/2023 1518   COCAINSCRNUR NONE DETECTED 04/07/2023  1518   LABBENZ NONE DETECTED 04/07/2023 1518   AMPHETMU NONE DETECTED 04/07/2023 1518   THCU NONE DETECTED 04/07/2023 1518   LABBARB NONE DETECTED 04/07/2023 1518    No results for input(s): ETH in the last 168 hours.  I have personally reviewed the radiological images below and agree with the radiology interpretations.  MR CERVICAL SPINE W WO CONTRAST Result Date: 07/14/2023 EXAM: MRI CERVICAL SPINE WITH AND WITHOUT CONTRAST 07/13/2023 10:05:00 PM TECHNIQUE: Multiplanar multisequence MRI of the cervical spine was performed without and with the administration of intravenous contrast. COMPARISON: MRI of the cervical spine 06/18/2011. CLINICAL HISTORY: Metastatic disease evaluation. Intracranial metastases. FINDINGS: BONES AND ALIGNMENT: Normal alignment. Normal vertebral body heights. Mild edematous endplate marrow changes are present at C5-6. No other marrow signal change or enhancement is present to suggest metastatic disease of the cervical spine. SPINAL CORD: Normal spinal cord size. Normal spinal cord signal. No pathologic enhancement is present to suggest metastatic disease of the spinal cord. SOFT TISSUES: No paraspinal mass. C2-C3: No significant disc herniation. No spinal canal stenosis or neural foraminal narrowing. C3-C4: Mild disc bulging without significant stenosis. C4-C5: A leftward disc osteophyte complex partially effaces the ventral CSF. Uncovertebral spurring contributes to moderate left foraminal stenosis. C5-C6: A rightward disc osteophyte complex partially effaces the ventral CSF. Uncovertebral spurring contributes to moderate right foraminal stenosis. C6-C7: No significant disc herniation. No spinal canal stenosis or  neural foraminal narrowing. C7-T1: Mild disc bulging without significant stenosis. IMPRESSION: 1. No evidence of metastatic disease in the cervical spine. 2. Moderate left foraminal stenosis at C4-C5 and moderate right foraminal stenosis at C5-C6 due to uncovertebral  spurring and disc osteophyte complexes. Electronically signed by: Lonni Necessary MD 07/14/2023 05:39 AM EDT RP Workstation: [id number]   CT ANGIO HEAD NECK W WO CM Result Date: 07/13/2023 CLINICAL DATA:  Stroke/TIA, determine embolic source EXAM: CT ANGIOGRAPHY HEAD AND NECK WITH AND WITHOUT CONTRAST TECHNIQUE: Multidetector CT imaging of the head and neck was performed using the standard protocol during bolus administration of intravenous contrast. Multiplanar CT image reconstructions and MIPs were obtained to evaluate the vascular anatomy. Carotid stenosis measurements (when applicable) are obtained utilizing NASCET criteria, using the distal internal carotid diameter as the denominator. RADIATION DOSE REDUCTION: This exam was performed according to the departmental dose-optimization program which includes automated exposure control, adjustment of the mA and/or kV according to patient size and/or use of iterative reconstruction technique. CONTRAST:  75mL OMNIPAQUE  IOHEXOL  350 MG/ML SOLN COMPARISON:  None Available. FINDINGS: CT HEAD FINDINGS Brain: No evidence of acute infarction, hemorrhage, hydrocephalus, extra-axial collection or mass lesion/mass effect. Vascular: See below. Skull: Normal. Negative for fracture or focal lesion. Sinuses/Orbits: Clear sinuses.  No acute orbital findings. Review of the MIP images confirms the above findings CTA NECK FINDINGS Aortic arch: Great vessel origins are patent. Right carotid system: No evidence of dissection, stenosis (50% or greater), or occlusion. Left carotid system: No evidence of dissection, stenosis (50% or greater), or occlusion. Vertebral arteries: Left dominant. Poor visualization of the proximal right vertebral artery due to streak artifact. No evidence of dissection, stenosis (50% or greater), or occlusion. Skeleton: No acute abnormality on limited assessment. Other neck: No acute abnormality on limited assessment. Upper chest: Increased size of a known  right upper lobe pulmonary. New surrounding consolidation. Review of the MIP images confirms the above findings CTA HEAD FINDINGS Anterior circulation: Bilateral intracranial ICAs, MCAs and ACAs are patent without proximal hemodynamically significant stenosis. Posterior circulation: Bilateral intradural vertebral arteries, basilar artery and bilateral posterior cerebral arteries are patent without proximal hemodynamically significant stenosis. Venous sinuses: As permitted by contrast timing, patent. Review of the MIP images confirms the above findings IMPRESSION: 1. No emergent large vessel occlusion or proximal hemodynamically significant stenosis. 2. Increased size of a known right upper lobe pulmonary. New surrounding consolidation, concerning for postobstructive pneumonia. Electronically Signed   By: Gilmore GORMAN Molt M.D.   On: 07/13/2023 21:21   ECHOCARDIOGRAM COMPLETE BUBBLE STUDY Result Date: 07/13/2023    ECHOCARDIOGRAM REPORT   Patient Name:   SHALETTA HINOSTROZA Date of Exam: 07/13/2023 Medical Rec #:  [id number]         Height:       67.0 in Accession #:    [id number]        Weight:       243.0 lb Date of Birth:  Jan 11, 1960         BSA:          2.197 m Patient Age:    63 years          BP:           165/78 mmHg Patient Gender: F                 HR:           82 bpm. Exam Location:  Inpatient Procedure: 2D Echo, Cardiac Doppler, Color Doppler and Saline Contrast  Bubble            Study (Both Spectral and Color Flow Doppler were utilized during            procedure). Indications:    TIA  History:        Patient has prior history of Echocardiogram examinations, most                 recent 04/09/2023. Abnormal ECG, Stroke, Arrythmias:Atrial                 Fibrillation, Signs/Symptoms:Chest Pain, Shortness of Breath and                 Dyspnea; Risk Factors:Hypertension, Diabetes, Dyslipidemia and                 Sleep Apnea. Metastatic lung cancer.  Sonographer:    Ellouise Mose RDCS Referring Phys: [id number]  [facility]  Sonographer Comments: Technically difficult study due to poor echo windows. Image acquisition challenging due to patient body habitus. IMPRESSIONS  1. Left ventricular ejection fraction, by estimation, is 55 to 60%. The left ventricle has normal function. The left ventricle has no regional wall motion abnormalities. There is mild left ventricular hypertrophy. Left ventricular diastolic parameters are indeterminate.  2. Right ventricular systolic function was not well visualized. The right ventricular size is not well visualized.  3. The mitral valve is normal in structure. No evidence of mitral valve regurgitation. No evidence of mitral stenosis.  4. Cannot rule out mobile echodensity on the non coronary cusp. The aortic valve is tricuspid. There is moderate calcification of the aortic valve. Aortic valve regurgitation is moderate. Aortic valve sclerosis is present, with no evidence of aortic valve stenosis.  5. The inferior vena cava is dilated in size with >50% respiratory variability, suggesting right atrial pressure of 8 mmHg.  6. Agitated saline contrast bubble study was negative, with no evidence of any interatrial shunt. FINDINGS  Left Ventricle: Left ventricular ejection fraction, by estimation, is 55 to 60%. The left ventricle has normal function. The left ventricle has no regional wall motion abnormalities. The left ventricular internal cavity size was normal in size. There is  mild left ventricular hypertrophy. Left ventricular diastolic parameters are indeterminate. Right Ventricle: The right ventricular size is not well visualized. Right vetricular wall thickness was not well visualized. Right ventricular systolic function was not well visualized. Left Atrium: Left atrial size was normal in size. Right Atrium: Right atrial size was normal in size. Pericardium: Trivial pericardial effusion is present. Mitral Valve: The mitral valve is normal in structure. No evidence of mitral  valve regurgitation. No evidence of mitral valve stenosis. Tricuspid Valve: The tricuspid valve is normal in structure. Tricuspid valve regurgitation is not demonstrated. No evidence of tricuspid stenosis. Aortic Valve: Cannot rule out mobile echodensity on the non coronary cusp. The aortic valve is tricuspid. There is moderate calcification of the aortic valve. Aortic valve regurgitation is moderate. Aortic valve sclerosis is present, with no evidence of aortic valve stenosis. Pulmonic Valve: The pulmonic valve was normal in structure. Pulmonic valve regurgitation is not visualized. No evidence of pulmonic stenosis. Aorta: The aortic root and ascending aorta are structurally normal, with no evidence of dilitation. Venous: The inferior vena cava is dilated in size with greater than 50% respiratory variability, suggesting right atrial pressure of 8 mmHg. IAS/Shunts: No atrial level shunt detected by color flow Doppler. Agitated saline contrast was given intravenously to evaluate for intracardiac shunting. Agitated  saline contrast bubble study was negative, with no evidence of any interatrial shunt. Additional Comments: 3D was performed not requiring image post processing on an independent workstation and was indeterminate.  LEFT VENTRICLE PLAX 2D LVIDd:         5.10 cm LVIDs:         3.60 cm LV PW:         1.10 cm LV IVS:        1.10 cm LVOT diam:     2.30 cm LV SV:         93 LV SV Index:   43 LVOT Area:     4.15 cm  LV Volumes (MOD) LV vol d, MOD A2C: 74.2 ml LV vol d, MOD A4C: 74.1 ml LV vol s, MOD A2C: 21.4 ml LV vol s, MOD A4C: 28.9 ml LV SV MOD A2C:     52.8 ml LV SV MOD A4C:     74.1 ml LV SV MOD BP:      51.9 ml IVC IVC diam: 2.20 cm LEFT ATRIUM             Index        RIGHT ATRIUM           Index LA Vol (A2C):   14.5 ml 6.60 ml/m   RA Area:     17.60 cm LA Vol (A4C):   47.8 ml 21.76 ml/m  RA Volume:   43.80 ml  19.94 ml/m LA Biplane Vol: 29.3 ml 13.34 ml/m  AORTIC VALVE LVOT Vmax:   144.00 cm/s LVOT  Vmean:  84.600 cm/s LVOT VTI:    0.225 m  AORTA Ao Root diam: 3.70 cm Ao Asc diam:  3.00 cm MITRAL VALVE MV Area (PHT): 3.39 cm     SHUNTS MV Decel Time: 224 msec     Systemic VTI:  0.22 m MV E velocity: 106.60 cm/s  Systemic Diam: 2.30 cm Maude Emmer MD Electronically signed by Maude Emmer MD Signature Date/Time: 07/13/2023/3:10:44 PM    Final    MR BRAIN W WO CONTRAST Result Date: 07/12/2023 CLINICAL DATA:  Neuro deficit, concern for stroke. Pain radiating down left arm with tingling. EXAM: MRI HEAD WITHOUT AND WITH CONTRAST TECHNIQUE: Multiplanar, multiecho pulse sequences of the brain and surrounding structures were obtained without and with intravenous contrast. CONTRAST:  10mL GADAVIST  GADOBUTROL  1 MMOL/ML IV SOLN COMPARISON:  CT head 04/14/2023.  MRI head 04/07/2023. FINDINGS: Brain: There are numerous peripherally enhancing lesions throughout the cerebral hemispheres and cerebellum. Reference lesions described as follows: 9 mm left cerebellar lesion (series 18, image 53) 5 mm lesion involving the superior aspect of the right middle cerebellar peduncle (image 65). 5 mm anterior right frontal lesion (image 94). 6 mm left frontal operculum (image 96) 5 mm parasagittal left frontal lesion near the vertex (image 146) Multiple lesions demonstrate diffusion signal abnormality. There is edema surrounding the left cerebellar lesion with mild local mass effect. Additional mild focus of edema within the inferior right cerebellum without associated enhancing lesion. Numerous additional foci of diffusion signal abnormality in the cerebral hemispheres and left cerebellum without definite ADC signal abnormality which may reflect late acute versus subacute infarcts. 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 No midline shift. Small chronic microhemorrhage in the lateral left occipital lobe. Scattered T2/FLAIR hyperintensity in the supratentorial white matter. The basilar cisterns are patent. No extra-axial fluid  collections. Ventricles: Normal size and configuration of the ventricles. Vascular: Skull base flow voids are visualized. Skull and  upper cervical spine: No focal abnormality. Sinuses/Orbits: Orbits are symmetric. Paranasal sinuses are clear. Other: Mastoid air cells are clear. IMPRESSION: Numerous enhancing lesions in the cerebral hemispheres and cerebellum concerning for intracranial metastatic disease. Dominant lesion in the left cerebellum demonstrates surrounding enhancement and mild local mass effect. No midline shift. Scattered foci of diffusion signal abnormality throughout the cerebral hemispheres and left cerebellum some of which correspond to enhancing lesions. However the majority of these foci likely reflect acute/subacute infarct likely of embolic etiology. Mild chronic microvascular ischemic changes. 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 Electronically Signed   By: Donnice Mania M.D.   On: 07/12/2023 21:02   DG Chest Portable 1 View Result Date: 07/12/2023 CLINICAL DATA:  Chest pain EXAM: PORTABLE CHEST 1 VIEW COMPARISON:  06/04/2023, CT 05/28/2023 FINDINGS: Left-sided central venous port tip over the SVC. Low lung volumes. Borderline cardiac enlargement. Vague right upper lobe opacity, presumably corresponds to history of CT demonstrated lung mass. IMPRESSION: Low lung volumes. Vague right upper lobe opacity, presumably corresponds to history of CT demonstrated lung mass. Electronically Signed   By: Luke Bun M.D.   On: 07/12/2023 19:14   IR IMAGING GUIDED PORT INSERTION Result Date: 07/06/2023 CLINICAL DATA:  Right upper lobe lung carcinoma and need for porta cath for chemotherapy. EXAM: IMPLANTED PORT A CATH PLACEMENT WITH ULTRASOUND AND FLUOROSCOPIC GUIDANCE ANESTHESIA/SEDATION: Moderate (conscious) sedation was employed during this procedure. A total of Versed  2.0 mg and Fentanyl  100 mcg was administered intravenously. Moderate Sedation Time: 40 minutes. The  patient's level of consciousness and vital signs were monitored continuously by radiology nursing throughout the procedure under my direct supervision. FLUOROSCOPY: Radiation Exposure Index: 7.1 mGy Kerma PROCEDURE: The procedure, risks, benefits, and alternatives were explained to the patient. Questions regarding the procedure were encouraged and answered. The patient understands and consents to the procedure. A time-out was performed prior to initiating the procedure. Ultrasound was utilized to confirm patency of the left internal jugular vein. An ultrasound image was saved and recorded. The left neck and chest were prepped with chlorhexidine  in a sterile fashion, and a sterile drape was applied covering the operative field. Maximum barrier sterile technique with sterile gowns and gloves were used for the procedure. Local anesthesia was provided with 1% lidocaine . After creating a small venotomy incision, a 21 gauge needle was advanced into the left internal jugular vein under direct, real-time ultrasound guidance. Ultrasound image documentation was performed. After securing guidewire access, an 8 Fr dilator was placed. A J-wire was kinked to measure appropriate catheter length. A subcutaneous port pocket was then created along the upper chest wall utilizing sharp and blunt dissection. Portable cautery was utilized. The pocket was irrigated with sterile saline. A single lumen power injectable port was chosen for placement. The 8 Fr catheter was tunneled from the port pocket site to the venotomy incision. The port was placed in the pocket. External catheter was trimmed to appropriate length based on guidewire measurement. At the venotomy, an 8 Fr peel-away sheath was placed over a guidewire. The catheter was then placed through the sheath and the sheath removed. Final catheter positioning was confirmed and documented with a fluoroscopic spot image. The port was accessed with a needle and aspirated and flushed with  heparinized saline. The access needle was removed. The venotomy and port pocket incisions were closed with subcutaneous 3-0 Monocryl and subcuticular 4-0 Vicryl. Dermabond was applied to both incisions. COMPLICATIONS: COMPLICATIONS None FINDINGS: After catheter placement, the tip lies at the  cavo-atrial junction. The catheter aspirates normally and is ready for immediate use. IMPRESSION: Placement of single lumen port a cath via left internal jugular vein. The catheter tip lies at the cavo-atrial junction. A power injectable port a cath was placed and is ready for immediate use. Electronically Signed   By: Marcey Moan M.D.   On: 07/06/2023 15:22   VAS US  LOWER EXTREMITY ARTERIAL DUPLEX Result Date: 06/16/2023 LOWER EXTREMITY ARTERIAL DUPLEX STUDY Patient Name:  HAYSLEE CASEBOLT  Date of Exam:   06/16/2023 Medical Rec #: [id number]          Accession #:    [id number] Date of Birth: Jun 01, 1960          Patient Gender: F Patient Age:   6 years Exam Location:  [place] Procedure:      VAS US  LOWER EXTREMITY ARTERIAL DUPLEX Referring Phys: ALMARIE PECK --------------------------------------------------------------------------------  Indications: Peripheral artery disease. High Risk Factors: Hypertension, hyperlipidemia, Diabetes, past history of                    smoking, coronary artery disease, prior CVA. Other Factors: Bilateral leg pain when walking, that relieves with rest.  Current ABI: Rt: 0.75/0.50              Lt: 0.55/0.37 Comparison Study: None. Performing Technologist: Bascom Burows RCS, RVS  Examination Guidelines: A complete evaluation includes B-mode imaging, spectral Doppler, color Doppler, and power Doppler as needed of all accessible portions of each vessel. Bilateral testing is considered an integral part of a complete examination. Limited examinations for reoccurring indications may be performed as noted.  +----------+--------+-----+--------+----------+--------+ RIGHT     PSV  cm/sRatioStenosisWaveform  Comments +----------+--------+-----+--------+----------+--------+ CFA Prox  300                  triphasic          +----------+--------+-----+--------+----------+--------+ DFA       162                  biphasic           +----------+--------+-----+--------+----------+--------+ SFA Prox  216                  triphasic          +----------+--------+-----+--------+----------+--------+ SFA Mid   128                  triphasic          +----------+--------+-----+--------+----------+--------+ SFA Distal115                  triphasic          +----------+--------+-----+--------+----------+--------+ POP Prox  83                   biphasic           +----------+--------+-----+--------+----------+--------+ POP Distal68                   monophasic         +----------+--------+-----+--------+----------+--------+ TP Trunk  71                   biphasic           +----------+--------+-----+--------+----------+--------+ ATA Prox  57                   biphasic           +----------+--------+-----+--------+----------+--------+ PTA Prox  32  monophasic         +----------+--------+-----+--------+----------+--------+ PTA Mid   39                   monophasic         +----------+--------+-----+--------+----------+--------+ PTA Distal37                   monophasic         +----------+--------+-----+--------+----------+--------+ PERO Mid  30                   monophasic         +----------+--------+-----+--------+----------+--------+ DP        69                   biphasic           +----------+--------+-----+--------+----------+--------+  +----------+--------+-----+--------+----------+--------+ LEFT      PSV cm/sRatioStenosisWaveform  Comments +----------+--------+-----+--------+----------+--------+ CFA Prox  239                  triphasic           +----------+--------+-----+--------+----------+--------+ DFA       199                  triphasic          +----------+--------+-----+--------+----------+--------+ SFA Prox  198                  triphasic          +----------+--------+-----+--------+----------+--------+ SFA Mid   141                  triphasic          +----------+--------+-----+--------+----------+--------+ SFA Distal96                   biphasic           +----------+--------+-----+--------+----------+--------+ POP Prox  44                   monophasic         +----------+--------+-----+--------+----------+--------+ POP Distal49                   monophasic         +----------+--------+-----+--------+----------+--------+ TP Trunk  48                   monophasic         +----------+--------+-----+--------+----------+--------+ ATA Prox  125                                     +----------+--------+-----+--------+----------+--------+ PTA Prox  61                   monophasic         +----------+--------+-----+--------+----------+--------+ PTA Mid   46                   monophasic         +----------+--------+-----+--------+----------+--------+ PTA Distal65                   monophasic         +----------+--------+-----+--------+----------+--------+ PERO Mid  39                   monophasic         +----------+--------+-----+--------+----------+--------+ DP        39  monophasic         +----------+--------+-----+--------+----------+--------+  Summary: Right: Atherosclerotic plaque noted throughout the left lower extremity with dampened monphasic doppler waveforms starting at the level of the popliteal artery. May suggest hemodynamically significant stenosis at the popliteal level. Right ABI mildly reduced at 0.75 Left: Left: Atherosclerotic plaque noted throughout the left lower extremity with dampened monphasic doppler waveforms starting at  the level of the popliteal artery. May suggest hemodynamically significant stenosis at the popliteal artery level. Left ABI moderately reduced at 0.55.  See table(s) above for measurements and observations. Suggest follow up study in 12 months. Suggest Peripheral Vascular Consult. Electronically signed by Gordy Bergamo MD on 06/16/2023 at 9:49:05 PM.    Final      PHYSICAL EXAM  Temp:  [97.4 F (36.3 C)-98.3 F (36.8 C)] 97.6 F (36.4 C) (07/08 1200) Pulse Rate:  [61-94] 94 (07/08 1200) Resp:  [18-19] 19 (07/08 1200) BP: (93-146)/(55-96) 137/76 (07/08 1200) SpO2:  [96 %-100 %] 99 % (07/08 1200)  General - Well nourished, well developed, in no apparent distress.  Ophthalmologic - fundi not visualized due to noncooperation.  Cardiovascular - Regular rhythm and rate.  Mental Status -  Level of arousal and orientation to time, place, and person were intact. Language including expression, naming, repetition, comprehension was assessed and found intact. Attention span and concentration were normal. Fund of Knowledge was assessed and was intact.  Cranial Nerves II - XII - II - Visual field intact OU. III, IV, VI - Extraocular movements intact. V - Facial sensation intact bilaterally. VII - Facial movement intact bilaterally. VIII - Hearing & vestibular intact bilaterally. X - Palate elevates symmetrically. XI - Chin turning & shoulder shrug intact bilaterally. XII - Tongue protrusion intact.  Motor Strength - The patient's strength was normal in all extremities and pronator drift was absent.  Bulk was normal and fasciculations were absent.   Motor Tone - Muscle tone was assessed at the neck and appendages and was normal.  Reflexes - The patient's reflexes were symmetrical in all extremities and she had no pathological reflexes.  Sensory - Light touch, temperature/pinprick were assessed and were symmetrical.    Coordination - The patient had normal movements in the hands with no ataxia  or dysmetria.  Tremor was absent.  Gait and Station - deferred.   ASSESSMENT/PLAN Ms. Deniz Eskridge is a 63 y.o. female with history of A-fib on Xarelto , hypertension, non-small cell lung cancer diagnosed in 04/2023, stroke in 04/2023, sleep apnea admitted for headache, left arm numbness and pain with low BP. No TNK given due to on Xarelto .    Stroke: Embolic shower secondary to hypercoagulable state with advanced malignancy Stroke in the setting of Xarelto  on hold for infusion port MRI Scattered foci of diffusion signal abnormality throughout the cerebral hemispheres and left cerebellum some of which correspond to enhancing lesions. However the majority of these foci likely reflect acute/subacute infarct likely of embolic etiology. CTA head and neck unremarkable 2D Echo EF 55 to 60%, no PFO LDL 94 HgbA1c 5.9 in 04/2023 Xarelto  for VTE prophylaxis Xarelto  (rivaroxaban ) daily prior to admission, now continue Xarelto  (rivaroxaban ) daily per hematology.  Patient counseled to be compliant with her antithrombotic medications Ongoing aggressive stroke risk factor management Therapy recommendations: None Disposition: Home  Non-small cell lung cancer Diagnosed in 04/2023 MRI brain Numerous enhancing lesions in the cerebral hemispheres and cerebellum concerning for intracranial metastatic disease. Dominant lesion in the left cerebellum demonstrates surrounding enhancement and mild local mass  effect.  MRI C-spine no metastasis Hematology on board, considering whole brain radiation Continue Xarelto   History of stroke 04/2023 admitted for right hand numbness and dizziness with blurry vision.  MRI showed bilateral small embolic infarcts.  Patient Xarelto  was on hold at that time for rectal bleeding and colonoscopy.  Xarelto  was later resumed.  Hypertension Stable Low on presentation Fluctuation, now stabilized Long term BP goal normotensive  Hyperlipidemia Home meds: Crestor  10 LDL 94, goal  < 70 Now on Crestor  20 Continue statin at discharge  Other Stroke Risk Factors Advanced age Obesity, Body mass index is 38.06 kg/m.  OSA  Other Active Problems AKI, creatinine 1.27--1.24  Hospital day # 2  Neurology will sign off. Please call with questions. Pt will follow up with Dr. Buck at [facility] in about 4 weeks. Thanks for the consult.   Ary Cummins, MD PhD Stroke Neurology 07/14/2023 3:45 PM    To contact Stroke Continuity provider, please refer to [email or web address]. After hours, contact General Neurology

## 2023-07-14 NOTE — Progress Notes (Addendum)
 PHARMACY - ANTICOAGULATION CONSULT NOTE  Pharmacy Consult for heparin  Indication: atrial fibrillation  Allergies  Allergen Reactions   Apresoline  [Hydralazine ] Nausea Only   Latex    Other     Band aids discolor skin ekg pads irritate skin    Prednisone  Swelling    Patient states that she can take methylprednisolone  without complications    Patient Measurements: Height: 5' 7 (170.2 cm) Weight: 110.2 kg (243 lb) IBW/kg (Calculated) : 61.6 HEPARIN  DW (KG): 87  Vital Signs: Temp: 98.3 F (36.8 C) (07/08 0752) Temp Source: Oral (07/08 0356) BP: 103/63 (07/08 0752) Pulse Rate: 74 (07/08 0752)  Labs: Recent Labs    07/12/23 1919 07/13/23 0400 07/13/23 2343 07/14/23 0833  HGB 12.5 11.7*  --  11.2*  HCT 37.9 34.7*  --  33.4*  PLT 191 182  --  187  APTT  --   --  35 57*  HEPARINUNFRC  --   --  >1.10* >1.10*  CREATININE 1.27* 1.24*  --   --     Estimated Creatinine Clearance: 60.2 mL/min (A) (by C-G formula based on SCr of 1.24 mg/dL (H)).   Medical History: Past Medical History:  Diagnosis Date   Anemia    Arthritis    Asthma    Atrial fibrillation (HCC) 2018   Back pain    BV (bacterial vaginosis) 05/23/2013   Constipation 11/21/2014   Coronary artery disease    Current use of long term anticoagulation 2018   Diabetes mellitus without complication (HCC)    Dysphagia 2011   Fibroids 03/13/2016   GERD (gastroesophageal reflux disease)    Goiter 2009   Hematochezia 2011   Hematuria 05/23/2013   Hyperlipidemia    Hypertension    Hypothyroidism    LGSIL of cervix of undetermined significance 03/04/2021   03/04/21 +HPV 16/other , will get colpo, per ASCCP guidelines, immediate CIN3+risjk is 5.65%   Migraines    PAF (paroxysmal atrial fibrillation) (HCC)    a. diagnosed in 11/2016 --> started on Xarelto  for anticoagulation   Pelvic pain in female 11/02/2013   Plantar fasciitis of right foot    Pre-diabetes    Sleep apnea    dont use cpap says causes  sinus infection    Medications:  -Xarelto  20mg  PO every day (last dose 7/6 @ 18:30)  Assessment: 75 yoF presented to ED with episode of tingling in right arm found to have multiple embolic strokes on MRI as well as brain metastasis not previously diagnosed. Pharmacy consulted to dose heparin .  -CBC stable -Given last dose of Xarelto  was 7/6 1800-1900 will start heparin  at time of next dose of Xarelto   AM: heparin  level falsely elevated given recent DOAC use and aPTT subtherapeutic at  57s on heparin  1200 units/hr. Per RN, no issues with the heparin  infusion running continuously or signs/symptoms of bleeding.  Goal of Therapy:  Heparin  level 0.3-0.5 units/mL aPTT 66-85 seconds  Monitor platelets by anticoagulation protocol: Yes   Plan:  Increase heparin  infusion to 1300 units/hr  Check aPTT in 8 hours and daily while on heparin  Monitor with aPTTs until correlates with heparin  level Continue to monitor H&H and platelets   ADDENDUM:  Cont heparin  infusion at 1300 U/H  until 1700 Resume Xarelto  20 mg Qsupper- administer first dose at the same time the heparin  infusion is stopped   Massie Fila, PharmD Clinical Pharmacist  07/14/2023 11:18 AM

## 2023-07-14 NOTE — Progress Notes (Signed)
 Pt off to unit for MRI, Ativan  med given prior to transport.

## 2023-07-14 NOTE — Discharge Summary (Signed)
 Physician Discharge Summary  Catina Nuss FMW:[id number] DOB: 1960/04/13 DOA: 07/12/2023  PCP: [name], MD  Admit date: 07/12/2023 Discharge date: 07/14/2023  Admitted From: home Disposition:  home  Recommendations for Outpatient Follow-up:  Follow up with PCP in 1-2 weeks Follow-up with radiation oncology tomorrow as scheduled Follow-up with oncology next week  Home Health: none Equipment/Devices: none  Discharge Condition: stable CODE STATUS: Full code Diet Orders (From admission, onward)     Start     Ordered   07/13/23 0040  Diet heart healthy/carb modified Room service appropriate? Yes; Fluid consistency: Thin  Diet effective now       Question Answer Comment  Diet-HS Snack? Nothing   Room service appropriate? Yes   Fluid consistency: Thin      07/13/23 0039           Brief Narrative / Interim history: 63 yo F with A-fib, prior CVA, HTN, hypothyroidism, recently diagnosed non-small cell lung cancer comes into the hospital with intermittent headaches, also left upper extremity paresthesias coming and going.  No other significant weakness or issues.  She is on Xarelto  for her A-fib, but held that for 2 days prior to the port implantation 07/06/2023.  She initially presented to [facility], MRI was positive for CVA, neurology was consulted and she was transferred to [facility] Course / Discharge diagnoses: Principal Problem:   Acute CVA (cerebrovascular accident) [facility]) Active Problems:   Atrial fibrillation, chronic (HCC)   Essential hypertension   Hypothyroidism   Type 2 diabetes mellitus with hyperlipidemia (HCC)   Non-small cell carcinoma of lung (HCC)   Obesity, class 2   Principal problem Acute CVA -MRI of the brain showed acute/subacute infarct likely of embolic etiology throughout bilateral hemispheres and left cerebellum.  Neurology consulted and followed patient while hospitalized.  Unfortunately she was off the Xarelto  for 2 to 3 days  prior to her port insertion, which might have contributed. Case discussed with Dr Odean oncology on call, would not consider this failure of Xarelto  since she has been off of it for little while and plan is to resume Xarelto  at discharge   Active problems Non-small cell lung cancer -just diagnosed earlier this year, establish care with Dr. Gatha.  She had port placed about a week ago, was scheduled to receive chemotherapy starting 7/7 but she was hospitalized.  Case MRI of the brain, in addition to CVA also showed numerous enhancing lesions concerning for intracranial metastatic disease.  I discussed with radiation oncology, they will plan further treatment changes based on this information, defer to outpatient setting  A-fib, chronic -continue rate control with diltiazem , anticoagulation with Xarelto  Hypothyroidism-continue Synthroid  Hyperlipidemia-continue statin Obesity, class II-she would benefit from weight loss  Sepsis ruled out   Discharge Instructions   Allergies as of 07/14/2023       Reactions   Apresoline  [hydralazine ] Nausea Only   Latex    Other    Band aids discolor skin ekg pads irritate skin    Prednisone  Swelling   Patient states that she can take methylprednisolone  without complications        Medication List     TAKE these medications    acetaminophen  500 MG tablet Commonly known as: TYLENOL  Take 1,000 mg by mouth every 6 (six) hours as needed for mild pain (pain score 1-3).   albuterol  108 (90 Base) MCG/ACT inhaler Commonly known as: VENTOLIN  HFA Inhale 2 puffs into the lungs every 4 (four) hours as needed for  wheezing or shortness of breath.   azelastine  0.1 % nasal spray Commonly known as: ASTELIN  Place 2 sprays into both nostrils 2 (two) times daily. Use in each nostril as directed   dexlansoprazole  60 MG capsule Commonly known as: DEXILANT  Take 1 capsule (60 mg total) by mouth daily.   diltiazem  360 MG 24 hr capsule Commonly known as:  TIAZAC  Take 360 mg by mouth daily.   EPINEPHrine  0.3 mg/0.3 mL Soaj injection Commonly known as: EPI-PEN Inject 0.3 mg into the muscle once as needed for anaphylaxis.   folic acid  1 MG tablet Commonly known as: FOLVITE  Take 1 tablet (1 mg total) by mouth daily.   glimepiride 2 MG tablet Commonly known as: AMARYL Take 2 mg by mouth daily with breakfast.   Klor-Con  M20 20 MEQ tablet Generic drug: potassium chloride  SA Take 20 mEq by mouth daily. [name] takes as needed when she has cramps What changed:  when to take this reasons to take this   lidocaine -prilocaine  cream Commonly known as: EMLA  Apply to affected area once   linaclotide  72 MCG capsule Commonly known as: Linzess  Take 1 capsule (72 mcg total) by mouth daily before breakfast.   montelukast  10 MG tablet Commonly known as: SINGULAIR  Take 10 mg by mouth at bedtime.   NON FORMULARY Pt uses a cpap nightly   olmesartan  40 MG tablet Commonly known as: Benicar  Take 1 tablet (40 mg total) by mouth daily. What changed: how much to take   ondansetron  4 MG disintegrating tablet Commonly known as: ZOFRAN -ODT Take 4 mg by mouth every 6 (six) hours as needed.   ondansetron  8 MG tablet Commonly known as: Zofran  Take 1 tablet (8 mg total) by mouth every 8 (eight) hours as needed for nausea or vomiting. Start on the third day after chemotherapy.   polyethylene glycol 17 g packet Commonly known as: MiraLax  Take 17 g by mouth daily.   prochlorperazine  10 MG tablet Commonly known as: COMPAZINE  Take 1 tablet (10 mg total) by mouth every 6 (six) hours as needed for nausea or vomiting.   promethazine -dextromethorphan 6.25-15 MG/5ML syrup Commonly known as: PROMETHAZINE -DM Take 5 mLs by mouth 4 (four) times daily as needed.   Restasis  0.05 % ophthalmic emulsion Generic drug: cycloSPORINE  Place 1 drop into both eyes 2 (two) times daily.   rivaroxaban  20 MG Tabs tablet Commonly known as: Xarelto  Take 1 tablet (20 mg  total) by mouth daily.   rosuvastatin  20 MG tablet Commonly known as: CRESTOR  Take 1 tablet (20 mg total) by mouth daily. What changed:  medication strength how much to take   spironolactone  25 MG tablet Commonly known as: ALDACTONE  TAKE 1 TABLET BY MOUTH EVERY DAY   Synthroid  25 MCG tablet Generic drug: levothyroxine  Take 25 mcg by mouth daily.   traMADol  50 MG tablet Commonly known as: ULTRAM  Take 25 mg by mouth every 6 (six) hours as needed.       Consultations: Neurology   Procedures/Studies:  MR CERVICAL SPINE W WO CONTRAST Result Date: 07/14/2023 EXAM: MRI CERVICAL SPINE WITH AND WITHOUT CONTRAST 07/13/2023 10:05:00 PM TECHNIQUE: Multiplanar multisequence MRI of the cervical spine was performed without and with the administration of intravenous contrast. COMPARISON: MRI of the cervical spine 06/18/2011. CLINICAL HISTORY: Metastatic disease evaluation. Intracranial metastases. FINDINGS: BONES AND ALIGNMENT: Normal alignment. Normal vertebral body heights. Mild edematous endplate marrow changes are present at C5-6. No other marrow signal change or enhancement is present to suggest metastatic disease of the cervical spine. SPINAL CORD:  Normal spinal cord size. Normal spinal cord signal. No pathologic enhancement is present to suggest metastatic disease of the spinal cord. SOFT TISSUES: No paraspinal mass. C2-C3: No significant disc herniation. No spinal canal stenosis or neural foraminal narrowing. C3-C4: Mild disc bulging without significant stenosis. C4-C5: A leftward disc osteophyte complex partially effaces the ventral CSF. Uncovertebral spurring contributes to moderate left foraminal stenosis. C5-C6: A rightward disc osteophyte complex partially effaces the ventral CSF. Uncovertebral spurring contributes to moderate right foraminal stenosis. C6-C7: No significant disc herniation. No spinal canal stenosis or neural foraminal narrowing. C7-T1: Mild disc bulging without significant  stenosis. IMPRESSION: 1. No evidence of metastatic disease in the cervical spine. 2. Moderate left foraminal stenosis at C4-C5 and moderate right foraminal stenosis at C5-C6 due to uncovertebral spurring and disc osteophyte complexes. Electronically signed by: Lonni Necessary MD 07/14/2023 05:39 AM EDT RP Workstation: [id number]   CT ANGIO HEAD NECK W WO CM Result Date: 07/13/2023 CLINICAL DATA:  Stroke/TIA, determine embolic source EXAM: CT ANGIOGRAPHY HEAD AND NECK WITH AND WITHOUT CONTRAST TECHNIQUE: Multidetector CT imaging of the head and neck was performed using the standard protocol during bolus administration of intravenous contrast. Multiplanar CT image reconstructions and MIPs were obtained to evaluate the vascular anatomy. Carotid stenosis measurements (when applicable) are obtained utilizing NASCET criteria, using the distal internal carotid diameter as the denominator. RADIATION DOSE REDUCTION: This exam was performed according to the departmental dose-optimization program which includes automated exposure control, adjustment of the mA and/or kV according to patient size and/or use of iterative reconstruction technique. CONTRAST:  75mL OMNIPAQUE  IOHEXOL  350 MG/ML SOLN COMPARISON:  None Available. FINDINGS: CT HEAD FINDINGS Brain: No evidence of acute infarction, hemorrhage, hydrocephalus, extra-axial collection or mass lesion/mass effect. Vascular: See below. Skull: Normal. Negative for fracture or focal lesion. Sinuses/Orbits: Clear sinuses.  No acute orbital findings. Review of the MIP images confirms the above findings CTA NECK FINDINGS Aortic arch: Great vessel origins are patent. Right carotid system: No evidence of dissection, stenosis (50% or greater), or occlusion. Left carotid system: No evidence of dissection, stenosis (50% or greater), or occlusion. Vertebral arteries: Left dominant. Poor visualization of the proximal right vertebral artery due to streak artifact. No evidence of  dissection, stenosis (50% or greater), or occlusion. Skeleton: No acute abnormality on limited assessment. Other neck: No acute abnormality on limited assessment. Upper chest: Increased size of a known right upper lobe pulmonary. New surrounding consolidation. Review of the MIP images confirms the above findings CTA HEAD FINDINGS Anterior circulation: Bilateral intracranial ICAs, MCAs and ACAs are patent without proximal hemodynamically significant stenosis. Posterior circulation: Bilateral intradural vertebral arteries, basilar artery and bilateral posterior cerebral arteries are patent without proximal hemodynamically significant stenosis. Venous sinuses: As permitted by contrast timing, patent. Review of the MIP images confirms the above findings IMPRESSION: 1. No emergent large vessel occlusion or proximal hemodynamically significant stenosis. 2. Increased size of a known right upper lobe pulmonary. New surrounding consolidation, concerning for postobstructive pneumonia. Electronically Signed   By: Gilmore GORMAN Molt M.D.   On: 07/13/2023 21:21   ECHOCARDIOGRAM COMPLETE BUBBLE STUDY Result Date: 07/13/2023    ECHOCARDIOGRAM REPORT   Patient Name:   LACHELE LIEVANOS Date of Exam: 07/13/2023 Medical Rec #:  [id number]         Height:       67.0 in Accession #:    [id number]        Weight:       243.0 lb Date of Birth:  Apr 01, 1960  BSA:          2.197 m Patient Age:    63 years          BP:           165/78 mmHg Patient Gender: F                 HR:           82 bpm. Exam Location:  Inpatient Procedure: 2D Echo, Cardiac Doppler, Color Doppler and Saline Contrast Bubble            Study (Both Spectral and Color Flow Doppler were utilized during            procedure). Indications:    TIA  History:        Patient has prior history of Echocardiogram examinations, most                 recent 04/09/2023. Abnormal ECG, Stroke, Arrythmias:Atrial                 Fibrillation, Signs/Symptoms:Chest Pain, Shortness of  Breath and                 Dyspnea; Risk Factors:Hypertension, Diabetes, Dyslipidemia and                 Sleep Apnea. Metastatic lung cancer.  Sonographer:    Ellouise Mose RDCS Referring Phys: [id number] [facility] ARRIEN  Sonographer Comments: Technically difficult study due to poor echo windows. Image acquisition challenging due to patient body habitus. IMPRESSIONS  1. Left ventricular ejection fraction, by estimation, is 55 to 60%. The left ventricle has normal function. The left ventricle has no regional wall motion abnormalities. There is mild left ventricular hypertrophy. Left ventricular diastolic parameters are indeterminate.  2. Right ventricular systolic function was not well visualized. The right ventricular size is not well visualized.  3. The mitral valve is normal in structure. No evidence of mitral valve regurgitation. No evidence of mitral stenosis.  4. Cannot rule out mobile echodensity on the non coronary cusp. The aortic valve is tricuspid. There is moderate calcification of the aortic valve. Aortic valve regurgitation is moderate. Aortic valve sclerosis is present, with no evidence of aortic valve stenosis.  5. The inferior vena cava is dilated in size with >50% respiratory variability, suggesting right atrial pressure of 8 mmHg.  6. Agitated saline contrast bubble study was negative, with no evidence of any interatrial shunt. FINDINGS  Left Ventricle: Left ventricular ejection fraction, by estimation, is 55 to 60%. The left ventricle has normal function. The left ventricle has no regional wall motion abnormalities. The left ventricular internal cavity size was normal in size. There is  mild left ventricular hypertrophy. Left ventricular diastolic parameters are indeterminate. Right Ventricle: The right ventricular size is not well visualized. Right vetricular wall thickness was not well visualized. Right ventricular systolic function was not well visualized. Left Atrium: Left atrial size was  normal in size. Right Atrium: Right atrial size was normal in size. Pericardium: Trivial pericardial effusion is present. Mitral Valve: The mitral valve is normal in structure. No evidence of mitral valve regurgitation. No evidence of mitral valve stenosis. Tricuspid Valve: The tricuspid valve is normal in structure. Tricuspid valve regurgitation is not demonstrated. No evidence of tricuspid stenosis. Aortic Valve: Cannot rule out mobile echodensity on the non coronary cusp. The aortic valve is tricuspid. There is moderate calcification of the aortic valve. Aortic valve regurgitation is moderate. Aortic valve sclerosis is  present, with no evidence of aortic valve stenosis. Pulmonic Valve: The pulmonic valve was normal in structure. Pulmonic valve regurgitation is not visualized. No evidence of pulmonic stenosis. Aorta: The aortic root and ascending aorta are structurally normal, with no evidence of dilitation. Venous: The inferior vena cava is dilated in size with greater than 50% respiratory variability, suggesting right atrial pressure of 8 mmHg. IAS/Shunts: No atrial level shunt detected by color flow Doppler. Agitated saline contrast was given intravenously to evaluate for intracardiac shunting. Agitated saline contrast bubble study was negative, with no evidence of any interatrial shunt. Additional Comments: 3D was performed not requiring image post processing on an independent workstation and was indeterminate.  LEFT VENTRICLE PLAX 2D LVIDd:         5.10 cm LVIDs:         3.60 cm LV PW:         1.10 cm LV IVS:        1.10 cm LVOT diam:     2.30 cm LV SV:         93 LV SV Index:   43 LVOT Area:     4.15 cm  LV Volumes (MOD) LV vol d, MOD A2C: 74.2 ml LV vol d, MOD A4C: 74.1 ml LV vol s, MOD A2C: 21.4 ml LV vol s, MOD A4C: 28.9 ml LV SV MOD A2C:     52.8 ml LV SV MOD A4C:     74.1 ml LV SV MOD BP:      51.9 ml IVC IVC diam: 2.20 cm LEFT ATRIUM             Index        RIGHT ATRIUM           Index LA Vol (A2C):    14.5 ml 6.60 ml/m   RA Area:     17.60 cm LA Vol (A4C):   47.8 ml 21.76 ml/m  RA Volume:   43.80 ml  19.94 ml/m LA Biplane Vol: 29.3 ml 13.34 ml/m  AORTIC VALVE LVOT Vmax:   144.00 cm/s LVOT Vmean:  84.600 cm/s LVOT VTI:    0.225 m  AORTA Ao Root diam: 3.70 cm Ao Asc diam:  3.00 cm MITRAL VALVE MV Area (PHT): 3.39 cm     SHUNTS MV Decel Time: 224 msec     Systemic VTI:  0.22 m MV E velocity: 106.60 cm/s  Systemic Diam: 2.30 cm Maude Emmer MD Electronically signed by Maude Emmer MD Signature Date/Time: 07/13/2023/3:10:44 PM    Final    MR BRAIN W WO CONTRAST Result Date: 07/12/2023 CLINICAL DATA:  Neuro deficit, concern for stroke. Pain radiating down left arm with tingling. EXAM: MRI HEAD WITHOUT AND WITH CONTRAST TECHNIQUE: Multiplanar, multiecho pulse sequences of the brain and surrounding structures were obtained without and with intravenous contrast. CONTRAST:  10mL GADAVIST  GADOBUTROL  1 MMOL/ML IV SOLN COMPARISON:  CT head 04/14/2023.  MRI head 04/07/2023. FINDINGS: Brain: There are numerous peripherally enhancing lesions throughout the cerebral hemispheres and cerebellum. Reference lesions described as follows: 9 mm left cerebellar lesion (series 18, image 53) 5 mm lesion involving the superior aspect of the right middle cerebellar peduncle (image 65). 5 mm anterior right frontal lesion (image 94). 6 mm left frontal operculum (image 96) 5 mm parasagittal left frontal lesion near the vertex (image 146) Multiple lesions demonstrate diffusion signal abnormality. There is edema surrounding the left cerebellar lesion with mild local mass effect. Additional mild focus of edema within the  inferior right cerebellum without associated enhancing lesion. Numerous additional foci of diffusion signal abnormality in the cerebral hemispheres and left cerebellum without definite ADC signal abnormality which may reflect late acute versus subacute infarcts. 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 No midline shift.  Small chronic microhemorrhage in the lateral left occipital lobe. Scattered T2/FLAIR hyperintensity in the supratentorial white matter. The basilar cisterns are patent. No extra-axial fluid collections. Ventricles: Normal size and configuration of the ventricles. Vascular: Skull base flow voids are visualized. Skull and upper cervical spine: No focal abnormality. Sinuses/Orbits: Orbits are symmetric. Paranasal sinuses are clear. Other: Mastoid air cells are clear. IMPRESSION: Numerous enhancing lesions in the cerebral hemispheres and cerebellum concerning for intracranial metastatic disease. Dominant lesion in the left cerebellum demonstrates surrounding enhancement and mild local mass effect. No midline shift. Scattered foci of diffusion signal abnormality throughout the cerebral hemispheres and left cerebellum some of which correspond to enhancing lesions. However the majority of these foci likely reflect acute/subacute infarct likely of embolic etiology. Mild chronic microvascular ischemic changes. 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 Electronically Signed   By: Donnice Mania M.D.   On: 07/12/2023 21:02   DG Chest Portable 1 View Result Date: 07/12/2023 CLINICAL DATA:  Chest pain EXAM: PORTABLE CHEST 1 VIEW COMPARISON:  06/04/2023, CT 05/28/2023 FINDINGS: Left-sided central venous port tip over the SVC. Low lung volumes. Borderline cardiac enlargement. Vague right upper lobe opacity, presumably corresponds to history of CT demonstrated lung mass. IMPRESSION: Low lung volumes. Vague right upper lobe opacity, presumably corresponds to history of CT demonstrated lung mass. Electronically Signed   By: Luke Bun M.D.   On: 07/12/2023 19:14   IR IMAGING GUIDED PORT INSERTION Result Date: 07/06/2023 CLINICAL DATA:  Right upper lobe lung carcinoma and need for porta cath for chemotherapy. EXAM: IMPLANTED PORT A CATH PLACEMENT WITH ULTRASOUND AND FLUOROSCOPIC GUIDANCE ANESTHESIA/SEDATION:  Moderate (conscious) sedation was employed during this procedure. A total of Versed  2.0 mg and Fentanyl  100 mcg was administered intravenously. Moderate Sedation Time: 40 minutes. The patient's level of consciousness and vital signs were monitored continuously by radiology nursing throughout the procedure under my direct supervision. FLUOROSCOPY: Radiation Exposure Index: 7.1 mGy Kerma PROCEDURE: The procedure, risks, benefits, and alternatives were explained to the patient. Questions regarding the procedure were encouraged and answered. The patient understands and consents to the procedure. A time-out was performed prior to initiating the procedure. Ultrasound was utilized to confirm patency of the left internal jugular vein. An ultrasound image was saved and recorded. The left neck and chest were prepped with chlorhexidine  in a sterile fashion, and a sterile drape was applied covering the operative field. Maximum barrier sterile technique with sterile gowns and gloves were used for the procedure. Local anesthesia was provided with 1% lidocaine . After creating a small venotomy incision, a 21 gauge needle was advanced into the left internal jugular vein under direct, real-time ultrasound guidance. Ultrasound image documentation was performed. After securing guidewire access, an 8 Fr dilator was placed. A J-wire was kinked to measure appropriate catheter length. A subcutaneous port pocket was then created along the upper chest wall utilizing sharp and blunt dissection. Portable cautery was utilized. The pocket was irrigated with sterile saline. A single lumen power injectable port was chosen for placement. The 8 Fr catheter was tunneled from the port pocket site to the venotomy incision. The port was placed in the pocket. External catheter was trimmed to appropriate length based on guidewire measurement. At the venotomy, an  8 Fr peel-away sheath was placed over a guidewire. The catheter was then placed through the  sheath and the sheath removed. Final catheter positioning was confirmed and documented with a fluoroscopic spot image. The port was accessed with a needle and aspirated and flushed with heparinized saline. The access needle was removed. The venotomy and port pocket incisions were closed with subcutaneous 3-0 Monocryl and subcuticular 4-0 Vicryl. Dermabond was applied to both incisions. COMPLICATIONS: COMPLICATIONS None FINDINGS: After catheter placement, the tip lies at the cavo-atrial junction. The catheter aspirates normally and is ready for immediate use. IMPRESSION: Placement of single lumen port a cath via left internal jugular vein. The catheter tip lies at the cavo-atrial junction. A power injectable port a cath was placed and is ready for immediate use. Electronically Signed   By: Marcey Moan M.D.   On: 07/06/2023 15:22   VAS US  LOWER EXTREMITY ARTERIAL DUPLEX Result Date: 06/16/2023 LOWER EXTREMITY ARTERIAL DUPLEX STUDY Patient Name:  BRIENNA BASS  Date of Exam:   06/16/2023 Medical Rec #: [id number]          Accession #:    [id number] Date of Birth: 09/29/1960          Patient Gender: F Patient Age:   63 years Exam Location:  [place] Procedure:      VAS US  LOWER EXTREMITY ARTERIAL DUPLEX Referring Phys: ALMARIE PECK --------------------------------------------------------------------------------  Indications: Peripheral artery disease. High Risk Factors: Hypertension, hyperlipidemia, Diabetes, past history of                    smoking, coronary artery disease, prior CVA. Other Factors: Bilateral leg pain when walking, that relieves with rest.  Current ABI: Rt: 0.75/0.50              Lt: 0.55/0.37 Comparison Study: None. Performing Technologist: Bascom Burows RCS, RVS  Examination Guidelines: A complete evaluation includes B-mode imaging, spectral Doppler, color Doppler, and power Doppler as needed of all accessible portions of each vessel. Bilateral testing is considered an integral part of a  complete examination. Limited examinations for reoccurring indications may be performed as noted.  +----------+--------+-----+--------+----------+--------+ RIGHT     PSV cm/sRatioStenosisWaveform  Comments +----------+--------+-----+--------+----------+--------+ CFA Prox  300                  triphasic          +----------+--------+-----+--------+----------+--------+ DFA       162                  biphasic           +----------+--------+-----+--------+----------+--------+ SFA Prox  216                  triphasic          +----------+--------+-----+--------+----------+--------+ SFA Mid   128                  triphasic          +----------+--------+-----+--------+----------+--------+ SFA Distal115                  triphasic          +----------+--------+-----+--------+----------+--------+ POP Prox  83                   biphasic           +----------+--------+-----+--------+----------+--------+ POP Distal68                   monophasic         +----------+--------+-----+--------+----------+--------+  TP Trunk  71                   biphasic           +----------+--------+-----+--------+----------+--------+ ATA Prox  57                   biphasic           +----------+--------+-----+--------+----------+--------+ PTA Prox  32                   monophasic         +----------+--------+-----+--------+----------+--------+ PTA Mid   39                   monophasic         +----------+--------+-----+--------+----------+--------+ PTA Distal37                   monophasic         +----------+--------+-----+--------+----------+--------+ PERO Mid  30                   monophasic         +----------+--------+-----+--------+----------+--------+ DP        69                   biphasic           +----------+--------+-----+--------+----------+--------+  +----------+--------+-----+--------+----------+--------+ LEFT      PSV  cm/sRatioStenosisWaveform  Comments +----------+--------+-----+--------+----------+--------+ CFA Prox  239                  triphasic          +----------+--------+-----+--------+----------+--------+ DFA       199                  triphasic          +----------+--------+-----+--------+----------+--------+ SFA Prox  198                  triphasic          +----------+--------+-----+--------+----------+--------+ SFA Mid   141                  triphasic          +----------+--------+-----+--------+----------+--------+ SFA Distal96                   biphasic           +----------+--------+-----+--------+----------+--------+ POP Prox  44                   monophasic         +----------+--------+-----+--------+----------+--------+ POP Distal49                   monophasic         +----------+--------+-----+--------+----------+--------+ TP Trunk  48                   monophasic         +----------+--------+-----+--------+----------+--------+ ATA Prox  125                                     +----------+--------+-----+--------+----------+--------+ PTA Prox  61                   monophasic         +----------+--------+-----+--------+----------+--------+ PTA Mid   46  monophasic         +----------+--------+-----+--------+----------+--------+ PTA Distal65                   monophasic         +----------+--------+-----+--------+----------+--------+ PERO Mid  39                   monophasic         +----------+--------+-----+--------+----------+--------+ DP        39                   monophasic         +----------+--------+-----+--------+----------+--------+  Summary: Right: Atherosclerotic plaque noted throughout the left lower extremity with dampened monphasic doppler waveforms starting at the level of the popliteal artery. May suggest hemodynamically significant stenosis at the popliteal level. Right ABI  mildly reduced at 0.75 Left: Left: Atherosclerotic plaque noted throughout the left lower extremity with dampened monphasic doppler waveforms starting at the level of the popliteal artery. May suggest hemodynamically significant stenosis at the popliteal artery level. Left ABI moderately reduced at 0.55.  See table(s) above for measurements and observations. Suggest follow up study in 12 months. Suggest Peripheral Vascular Consult. Electronically signed by Gordy Bergamo MD on 06/16/2023 at 9:49:05 PM.    Final      Subjective: - no chest pain, shortness of breath, no abdominal pain, nausea or vomiting.   Discharge Exam: BP 137/76 (BP Location: Left Arm)   Pulse 94   Temp 97.6 F (36.4 C)   Resp 19   Ht 5' 7 (1.702 m)   Wt 110.2 kg   SpO2 99%   BMI 38.06 kg/m   General: Pt is alert, awake, not in acute distress Cardiovascular: RRR, S1/S2 +, no rubs, no gallops Respiratory: CTA bilaterally, no wheezing, no rhonchi Abdominal: Soft, NT, ND, bowel sounds + Extremities: no edema, no cyanosis    The results of significant diagnostics from this hospitalization (including imaging, microbiology, ancillary and laboratory) are listed below for reference.     Microbiology: Recent Results (from the past 240 hours)  Culture, group A strep     Status: None (Preliminary result)   Collection Time: 07/12/23 12:26 PM   Specimen: Throat  Result Value Ref Range Status   Specimen Description   Final    THROAT Performed at [facility], [address] [place], [place] [address]    Special Requests   Final    NONE Performed at [facility], [address] [place], [place] [address]    Culture   Final    TOO YOUNG TO READ Performed at [facility], [address], [place], [place] [address]    Report Status PENDING  Incomplete     Labs: Basic Metabolic Panel: Recent Labs  Lab 07/12/23 1919 07/13/23 0400  NA 140 140  K 4.0 4.2  CL 103 105  CO2 25 23  GLUCOSE 107* 137*  BUN 18 20   CREATININE 1.27* 1.24*  CALCIUM  9.8 9.7   Liver Function Tests: No results for input(s): AST, ALT, ALKPHOS, BILITOT, PROT, ALBUMIN in the last 168 hours. CBC: Recent Labs  Lab 07/12/23 1919 07/13/23 0400 07/14/23 0833  WBC 6.9 6.5 5.3  NEUTROABS 4.0  --   --   HGB 12.5 11.7* 11.2*  HCT 37.9 34.7* 33.4*  MCV 90.9 91.3 90.0  PLT 191 182 187   CBG: Recent Labs  Lab 07/13/23 0947 07/13/23 1243 07/13/23 1628 07/14/23 0630 07/14/23 1158  GLUCAP 106* 140* 126* 107* 102*   Hgb A1c No results for input(s): HGBA1C in the last 72 hours. Lipid Profile Recent Labs    07/13/23 0400  CHOL 161  HDL 49  LDLCALC 94  TRIG 89  CHOLHDL 3.3   Thyroid  function studies No results for input(s): TSH, T4TOTAL, T3FREE, THYROIDAB in the last 72 hours.  Invalid input(s): FREET3 Urinalysis    Component Value Date/Time   COLORURINE STRAW (A) 04/07/2023 1518   APPEARANCEUR CLEAR 04/07/2023 1518   APPEARANCEUR Clear 01/01/2017 1400   LABSPEC 1.005 04/07/2023 1518   PHURINE 6.0 04/07/2023 1518   GLUCOSEU NEGATIVE 04/07/2023 1518   HGBUR MODERATE (A) 04/07/2023 1518   BILIRUBINUR NEGATIVE 04/07/2023 1518   BILIRUBINUR Negative 01/01/2017 1400   KETONESUR NEGATIVE 04/07/2023 1518   PROTEINUR NEGATIVE 04/07/2023 1518   UROBILINOGEN 0.2 01/16/2018 1308   NITRITE NEGATIVE 04/07/2023 1518   LEUKOCYTESUR NEGATIVE 04/07/2023 1518    FURTHER DISCHARGE INSTRUCTIONS:   Get Medicines reviewed and adjusted: Please take all your medications with you for your next visit with your Primary MD   Laboratory/radiological data: Please request your Primary MD to go over all hospital tests and procedure/radiological results at the follow up, please ask your Primary MD to get all Hospital records sent to his/her office.   In some cases, they will be blood work, cultures and biopsy results pending at the time of your discharge. Please request that your primary care M.D. goes  through all the records of your hospital data and follows up on these results.   Also Note the following: If you experience worsening of your admission symptoms, develop shortness of breath, life threatening emergency, suicidal or homicidal thoughts you must seek medical attention immediately by calling 911 or calling your MD immediately  if symptoms less severe.   You must read complete instructions/literature along with all the possible adverse reactions/side effects for all the Medicines you take and that have been prescribed to you. Take any new Medicines after you have completely understood and accpet all the possible adverse reactions/side effects.    Do not drive when taking Pain medications or sleeping medications (Benzodaizepines)   Do not take more than prescribed Pain, Sleep and Anxiety Medications. It is not advisable to combine anxiety,sleep and pain medications without talking with your primary care practitioner   Special Instructions: If you have smoked or chewed Tobacco  in the last 2 yrs please stop smoking, stop any regular Alcohol  and or any Recreational drug use.   Wear Seat belts while driving.   Please note: You were cared for by a hospitalist during your hospital stay. Once you are discharged, your primary care physician will handle any further medical issues. Please note that NO REFILLS for any discharge medications will be authorized once you are discharged, as it is imperative that you return to your primary care physician (or establish a relationship with a primary care physician if you do not have one) for your post hospital discharge needs so that they can reassess your need for medications and monitor your lab values.  Time coordinating discharge: 35 minutes  SIGNED:  Nilda Fendt, MD, PhD 07/14/2023, 1:08 PM

## 2023-07-14 NOTE — Progress Notes (Signed)
   07/14/23 0208  BiPAP/CPAP/SIPAP  $ Non-Invasive Ventilator  Non-Invasive Vent Set Up;Non-Invasive Vent Initial  $ Face Mask Medium Yes  BiPAP/CPAP/SIPAP Pt Type Adult  BiPAP/CPAP/SIPAP Resmed  Mask Type Full face mask  Dentures removed? Not applicable  Mask Size Medium  Respiratory Rate 18 breaths/min  Patient Home Machine No  Patient Home Mask No  Patient Home Tubing No  Auto Titrate Yes  Minimum cmH2O 6 cmH2O  Maximum cmH2O 13 cmH2O  Device Plugged into RED Power Outlet Yes  BiPAP/CPAP /SiPAP Vitals  Bilateral Breath Sounds Clear

## 2023-07-14 NOTE — TOC Transition Note (Signed)
 Transition of Care [facility]) - Discharge Note   Patient Details  Name: Becky [name] MRN: [id number] Date of Birth: Oct 29, 1960  Transition of [facility]) CM/SW Contact:  Andrez JULIANNA George, RN Phone Number: 07/14/2023, 2:02 PM   Clinical Narrative:     Pt is discharging home with self care. No follow up per therapies.  Pt has transport home.  Final next level of care: Home/Self Care Barriers to Discharge: No Barriers Identified   Patient Goals and CMS Choice            Discharge Placement                       Discharge Plan and Services Additional resources added to the After Visit Summary for                                       Social Drivers of Health (SDOH) Interventions SDOH Screenings   Food Insecurity: No Food Insecurity (07/14/2023)  Housing: Low Risk  (07/14/2023)  Transportation Needs: No Transportation Needs (07/14/2023)  Utilities: Not At Risk (07/14/2023)  Alcohol Screen: Low Risk  (05/28/2022)  Depression (PHQ2-9): Low Risk  (06/30/2023)  Financial Resource Strain: Low Risk  (03/24/2023)  Physical Activity: Sufficiently Active (08/25/2022)  Social Connections: Moderately Integrated (04/07/2023)  Stress: No Stress Concern Present (05/28/2022)  Tobacco Use: Medium Risk (07/12/2023)  Health Literacy: Adequate Health Literacy (10/16/2022)     Readmission Risk Interventions    04/08/2023   11:09 AM  Readmission Risk Prevention Plan  Transportation Screening Complete  Home Care Screening Complete  Medication Review (RN CM) Complete

## 2023-07-14 NOTE — Progress Notes (Signed)
 PHARMACY - ANTICOAGULATION CONSULT NOTE  Pharmacy Consult for heparin  Indication: atrial fibrillation  Allergies  Allergen Reactions   Apresoline  [Hydralazine ] Nausea Only   Latex    Other     Band aids discolor skin ekg pads irritate skin    Prednisone  Swelling    Patient states that she can take methylprednisolone  without complications    Patient Measurements: Height: 5' 7 (170.2 cm) Weight: 110.2 kg (243 lb) IBW/kg (Calculated) : 61.6 HEPARIN  DW (KG): 87  Vital Signs: Temp: 98.1 F (36.7 C) (07/08 0016) Temp Source: Oral (07/08 0016) BP: 93/55 (07/08 0016) Pulse Rate: 72 (07/08 0016)  Labs: Recent Labs    07/12/23 1919 07/13/23 0400 07/13/23 2343  HGB 12.5 11.7*  --   HCT 37.9 34.7*  --   PLT 191 182  --   APTT  --   --  35  HEPARINUNFRC  --   --  >1.10*  CREATININE 1.27* 1.24*  --     Estimated Creatinine Clearance: 60.2 mL/min (A) (by C-G formula based on SCr of 1.24 mg/dL (H)).   Medical History: Past Medical History:  Diagnosis Date   Anemia    Arthritis    Asthma    Atrial fibrillation (HCC) 2018   Back pain    BV (bacterial vaginosis) 05/23/2013   Constipation 11/21/2014   Coronary artery disease    Current use of long term anticoagulation 2018   Diabetes mellitus without complication (HCC)    Dysphagia 2011   Fibroids 03/13/2016   GERD (gastroesophageal reflux disease)    Goiter 2009   Hematochezia 2011   Hematuria 05/23/2013   Hyperlipidemia    Hypertension    Hypothyroidism    LGSIL of cervix of undetermined significance 03/04/2021   03/04/21 +HPV 16/other , will get colpo, per ASCCP guidelines, immediate CIN3+risjk is 5.65%   Migraines    PAF (paroxysmal atrial fibrillation) (HCC)    a. diagnosed in 11/2016 --> started on Xarelto  for anticoagulation   Pelvic pain in female 11/02/2013   Plantar fasciitis of right foot    Pre-diabetes    Sleep apnea    dont use cpap says causes sinus infection    Medications:  -Xarelto  20mg   PO every day (last dose 7/6 @ 18:30)  Assessment: 32 yoF presented to ED with episode of tingling in right arm found to have multiple embolic strokes on MRI as well as brain metastasis not previously diagnosed. Pharmacy consulted to dose heparin .  -CBC stable -Given last dose of Xarelto  was 7/6 1800-1900 will start heparin  at time of next dose of Xarelto   AM: heparin  level falsely elevated given recent DOAC use and aPTT subtherapeutic on 1000 units/hr. Per RN, no issues with the heparin  infusion running continuously or signs/symptoms of bleeding.  Goal of Therapy:  Heparin  level 0.3-0.5 units/mL aPTT 66-85 seconds  Monitor platelets by anticoagulation protocol: Yes   Plan:  Increase heparin  infusion to 1200 units/hr  Check anti-Xa level and aPTT in 8 hours and daily while on heparin  Monitor with aPTTs until correlates with heparin  level Continue to monitor H&H and platelets F/u plans to transition back to DOAC or lovenox  on 7/8  Lynwood Poplar, PharmD, BCPS Clinical Pharmacist 07/14/2023 12:58 AM

## 2023-07-14 NOTE — Plan of Care (Signed)
 Maintained on Heparin  drip, monitored for bleeding. No significant changes happen as of this time. Problem: Education: Goal: Ability to describe self-care measures that may prevent or decrease complications (Diabetes Survival Skills Education) will improve Outcome: Progressing   Problem: Health Behavior/Discharge Planning: Goal: Ability to manage health-related needs will improve Outcome: Progressing   Problem: Nutritional: Goal: Maintenance of adequate nutrition will improve Outcome: Progressing   Problem: Ischemic Stroke/TIA Tissue Perfusion: Goal: Complications of ischemic stroke/TIA will be minimized Outcome: Progressing   Problem: Coping: Goal: Will verbalize positive feelings about self Outcome: Progressing   Problem: Self-Care: Goal: Ability to participate in self-care as condition permits will improve Outcome: Progressing   Problem: Clinical Measurements: Goal: Will remain free from infection Outcome: Progressing   Problem: Safety: Goal: Ability to remain free from injury will improve Outcome: Progressing   Problem: Skin Integrity: Goal: Risk for impaired skin integrity will decrease Outcome: Progressing

## 2023-07-14 NOTE — Progress Notes (Signed)
 Back to unit, hooked back to tele monitoring.

## 2023-07-14 NOTE — Progress Notes (Signed)
 PT Cancellation Note  Patient Details Name: Becky [name] MRN: [id number] DOB: 02-05-1960   Cancelled Treatment:    Reason Eval/Treat Not Completed: PT screened, no needs identified, will sign off (pt evaluated by OT yesterday who reports pt is mobilizing well and has no acute PT needs)  Becky [name], PT, DPT Acute Rehabilitation Services Office [phone]    Becky [name] 07/14/2023, 1:21 PM

## 2023-07-14 NOTE — TOC Initial Note (Signed)
 Transition of Care [facility]) - Initial/Assessment Note    Patient Details  Name: Becky [name] MRN: [id number] Date of Birth: 1960-10-18  Transition of Care [facility]) CM/SW Contact:    Andrez JULIANNA George, RN Phone Number: 07/14/2023, 12:48 PM  Clinical Narrative:                  PCP: [name], NP Pt is from home with her spouse and daughter. Someone is with her most of the time.  Pt uses CPAP at home but states it is having air leaks. CM will update [employer].  Family provides needed transportation. Pt manages her own medications and denies any issues.  No follow up needs per therapies.  TOC following.  Expected Discharge Plan: Home/Self Care Barriers to Discharge: Continued Medical Work up   Patient Goals and CMS Choice            Expected Discharge Plan and Services       Living arrangements for the past 2 months: Single Family Home                                      Prior Living Arrangements/Services Living arrangements for the past 2 months: Single Family Home Lives with:: Adult Children, Spouse Patient language and need for interpreter reviewed:: Yes Do you feel safe going back to the place where you live?: Yes        Care giver support system in place?: Yes (comment) Current home services: DME (CPAP/ cane/ walker/) Criminal Activity/Legal Involvement Pertinent to Current Situation/Hospitalization: No - Comment as needed  Activities of Daily Living      Permission Sought/Granted                  Emotional Assessment Appearance:: Appears stated age Attitude/Demeanor/Rapport: Engaged Affect (typically observed): Accepting Orientation: : Oriented to Self, Oriented to Place, Oriented to  Time, Oriented to Situation   Psych Involvement: No (comment)  Admission diagnosis:  Metastasis to brain [facility]) [C79.31] CVA (cerebral vascular accident) (HCC) [I63.9] Ischemic embolic stroke [facility]) [I63.9] Patient Active Problem List   Diagnosis  Date Noted   Acute CVA (cerebrovascular accident) (HCC) 07/12/2023   Obesity, class 2 07/12/2023   Folate deficiency 06/18/2023   Hemoptysis 06/04/2023   Non-small cell carcinoma of lung (HCC) 06/04/2023   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) 06/04/2023   Metastasis to bone (HCC) 06/04/2023   Lung mass 05/27/2023   Hematochezia 04/09/2023   Stroke-like symptoms 04/08/2023   Stroke (HCC) 04/07/2023   Weakness of right quadriceps muscle 06/05/2022   ASCUS of cervix with negative high risk HPV 04/14/2022   Encounter for screening fecal occult blood testing 04/07/2022   LGSIL on Pap smear of cervix 04/07/2022   Encounter for gynecological examination with Papanicolaou smear of cervix 04/07/2022   S/P total knee arthroplasty, right 01/29/2022   Pain from implanted hardware 11/08/2021   Painful orthopaedic hardware (HCC) 09/19/2021   Prolapsed internal hemorrhoids, grade 3 05/02/2021   LGSIL of cervix of undetermined significance 03/04/2021   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   Vulvar itching 12/07/2020   Prolapsed internal hemorrhoids, grade 2 10/11/2020   Trochanteric bursitis, right hip 10/04/2020   Trochanteric bursitis, left hip 08/02/2020   Unilateral primary osteoarthritis, left knee 08/02/2020   Hemorrhoids 08/01/2020   GI bleeding 07/18/2020   Anemia    Shortness of breath    Rectal bleeding 09/17/2018  Vaginal odor 09/24/2017   Abdominal pain 08/18/2017   Nausea without vomiting 11/17/2016   Current use of long term anticoagulation 11/12/2016   Atrial fibrillation, chronic (HCC) 11/07/2016   Coronary artery disease due to lipid rich plaque    RLQ abdominal pain 10/03/2016   Yeast infection 03/13/2016   Fibroids 03/13/2016   Screening for colorectal cancer 11/28/2015   Burning with urination 11/28/2015   Subacute frontal sinusitis 11/28/2015   Leg cramps 10/31/2015   Chest pain 10/31/2015   Varicose 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  07/23/2015   Body aches 11/21/2014   Constipation 11/21/2014   Vaginal itching 03/24/2014   Vaginal discharge 03/24/2014   Pelvic pain 11/02/2013   Elevated cholesterol 11/02/2013   Low back pain 10/20/2013   Stiffness of ankle joint 10/20/2013   Pain in joint, ankle and foot 10/20/2013   Vaginal irritation 05/23/2013   Hematuria 05/23/2013   BV (bacterial vaginosis) 05/23/2013   HEMATOCHEZIA 12/05/2009   Dysphagia 12/05/2009   CHEST WALL PAIN, ANTERIOR 06/23/2007   LEG PAIN 05/18/2007   VAGINAL PRURITUS 04/16/2007   GOITER 03/10/2007   Hypothyroidism 03/10/2007   Type 2 diabetes mellitus with hyperlipidemia (HCC) 03/10/2007   HYPERLIPIDEMIA 03/10/2007   ANEMIA-IRON DEFICIENCY 03/10/2007   Essential hypertension 03/10/2007   RHINITIS, CHRONIC 03/10/2007   ASTHMA, CHILDHOOD 03/10/2007   Gastroesophageal reflux disease 03/10/2007   PEPTIC ULCER DISEASE 03/10/2007   MENOPAUSAL SYNDROME 03/10/2007   PCP:  [name], MD Pharmacy:   [facility] [address], [place] [address] [place] [address] [place] [address] Phone: [phone] Fax: [phone]     Social Drivers of Health (SDOH) Social History: SDOH Screenings   Food Insecurity: No Food Insecurity (06/30/2023)  Housing: Unknown (06/30/2023)  Transportation Needs: No Transportation Needs (06/30/2023)  Utilities: Not At Risk (06/30/2023)  Alcohol Screen: Low Risk  (05/28/2022)  Depression (PHQ2-9): Low Risk  (06/30/2023)  Financial Resource Strain: Low Risk  (03/24/2023)  Physical Activity: Sufficiently Active (08/25/2022)  Social Connections: Moderately Integrated (04/07/2023)  Stress: No Stress Concern Present (05/28/2022)  Tobacco Use: Medium Risk (07/12/2023)  Health Literacy: Adequate Health Literacy (10/16/2022)   SDOH Interventions:     Readmission Risk Interventions    04/08/2023   11:09 AM  Readmission Risk Prevention Plan  Transportation Screening Complete  Home Care Screening  Complete  Medication Review (RN CM) Complete

## 2023-07-15 ENCOUNTER — Ambulatory Visit
Admission: RE | Admit: 2023-07-15 | Discharge: 2023-07-15 | Disposition: A | Discharge status: Home / Self Care | Source: Ambulatory Visit | Attending: Radiation Oncology | Admitting: Radiation Oncology

## 2023-07-15 ENCOUNTER — Telehealth: Payer: Self-pay

## 2023-07-15 ENCOUNTER — Ambulatory Visit
Admission: RE | Admit: 2023-07-15 | Discharge: 2023-07-15 | Discharge status: Home / Self Care | Source: Ambulatory Visit | Attending: Radiation Oncology | Admitting: Radiation Oncology

## 2023-07-15 ENCOUNTER — Other Ambulatory Visit: Payer: Self-pay

## 2023-07-15 ENCOUNTER — Ambulatory Visit (HOSPITAL_COMMUNITY)

## 2023-07-15 ENCOUNTER — Encounter: Payer: Self-pay | Admitting: Licensed Clinical Social Worker

## 2023-07-15 ENCOUNTER — Other Ambulatory Visit: Payer: Self-pay | Admitting: Radiation Oncology

## 2023-07-15 DIAGNOSIS — C3491 Malignant neoplasm of unspecified part of right bronchus or lung: Secondary | ICD-10-CM

## 2023-07-15 DIAGNOSIS — R918 Other nonspecific abnormal finding of lung field: Secondary | ICD-10-CM | POA: Diagnosis present

## 2023-07-15 DIAGNOSIS — C3490 Malignant neoplasm of unspecified part of unspecified bronchus or lung: Secondary | ICD-10-CM

## 2023-07-15 DIAGNOSIS — C7931 Secondary malignant neoplasm of brain: Secondary | ICD-10-CM | POA: Diagnosis present

## 2023-07-15 DIAGNOSIS — F419 Anxiety disorder, unspecified: Secondary | ICD-10-CM | POA: Insufficient documentation

## 2023-07-15 DIAGNOSIS — C7951 Secondary malignant neoplasm of bone: Secondary | ICD-10-CM | POA: Diagnosis present

## 2023-07-15 LAB — CULTURE, GROUP A STREP (THRC)

## 2023-07-15 LAB — RAD ONC ARIA SESSION SUMMARY
Course Elapsed Days: 7
Plan Fractions Treated to Date: 3
Plan Prescribed Dose Per Fraction: 2 Gy
Plan Total Fractions Prescribed: 30
Plan Total Prescribed Dose: 60 Gy
Reference Point Dosage Given to Date: 6 Gy
Reference Point Session Dosage Given: 2 Gy
Session Number: 3

## 2023-07-15 MED ORDER — LORAZEPAM 1 MG PO TABS
ORAL_TABLET | ORAL | Status: AC
Start: 2023-07-15 — End: 2023-07-15
  Filled 2023-07-15: qty 1

## 2023-07-15 MED ORDER — LORAZEPAM 1 MG PO TABS
0.5000 mg | ORAL_TABLET | Freq: Once | ORAL | Status: AC
  Administered 2023-07-15: 0.5 mg via ORAL

## 2023-07-15 MED ORDER — LORAZEPAM 0.5 MG PO TABS: 0.5000 mg | ORAL_TABLET | Freq: Three times a day (TID) | ORAL | 0 refills | Status: DC | PRN

## 2023-07-15 MED ORDER — SONAFINE EX EMUL
1.0000 | Freq: Once | CUTANEOUS | Status: AC
  Administered 2023-07-15: 1 via TOPICAL

## 2023-07-15 NOTE — Progress Notes (Signed)
 [facility] [name] Progress Note  Clinical Child psychotherapist received a referral for pt following initial consult with oncologist to assess which was completed 7/2.  [name] received another referral from radiation regarding depression.  [name] addressed depression and provided pt w/ information for emotional support during initial consult.  Pt has contact information for [name] for additional support as needed.          Becky JONELLE Manna, LCSW Clinical Social Worker [facility]

## 2023-07-15 NOTE — Transitions of Care (Post Inpatient/ED Visit) (Signed)
   07/15/2023  Name: Becky [name] MRN: [id number] DOB: [birth year]  Today's TOC FU Call Status: Today's TOC FU Call Status:: Unsuccessful Call (1st Attempt) Unsuccessful Call (1st Attempt) Date: 07/15/23  Attempted to reach the patient regarding the most recent Inpatient/ED visit.  Follow Up Plan: Additional outreach attempts will be made to reach the patient to complete the Transitions of Care (Post Inpatient/ED visit) call.   Medford Balboa, BSN, RN [place]  VBCI - [employer] [facility] RN Care Manager [phone]

## 2023-07-16 ENCOUNTER — Encounter: Payer: Self-pay | Admitting: Cardiology

## 2023-07-16 ENCOUNTER — Other Ambulatory Visit: Payer: Self-pay

## 2023-07-16 ENCOUNTER — Ambulatory Visit: Attending: Cardiology | Admitting: Cardiology

## 2023-07-16 ENCOUNTER — Telehealth: Payer: Self-pay

## 2023-07-16 ENCOUNTER — Ambulatory Visit
Admission: RE | Admit: 2023-07-16 | Discharge: 2023-07-16 | Disposition: A | Discharge status: Home / Self Care | Source: Ambulatory Visit | Attending: Radiation Oncology | Admitting: Radiation Oncology

## 2023-07-16 ENCOUNTER — Encounter: Payer: Self-pay | Admitting: Internal Medicine

## 2023-07-16 ENCOUNTER — Ambulatory Visit (HOSPITAL_COMMUNITY): Payer: Self-pay

## 2023-07-16 VITALS — BP 102/67 | HR 112 | Ht 67.0 in | Wt 245.0 lb

## 2023-07-16 DIAGNOSIS — I4819 Other persistent atrial fibrillation: Secondary | ICD-10-CM | POA: Diagnosis not present

## 2023-07-16 DIAGNOSIS — C3490 Malignant neoplasm of unspecified part of unspecified bronchus or lung: Secondary | ICD-10-CM

## 2023-07-16 DIAGNOSIS — C3491 Malignant neoplasm of unspecified part of right bronchus or lung: Secondary | ICD-10-CM

## 2023-07-16 DIAGNOSIS — I739 Peripheral vascular disease, unspecified: Secondary | ICD-10-CM

## 2023-07-16 DIAGNOSIS — Z8719 Personal history of other diseases of the digestive system: Secondary | ICD-10-CM

## 2023-07-16 LAB — RAD ONC ARIA SESSION SUMMARY
Course Elapsed Days: 8
Plan Fractions Treated to Date: 1
Plan Fractions Treated to Date: 4
Plan Prescribed Dose Per Fraction: 2 Gy
Plan Prescribed Dose Per Fraction: 3 Gy
Plan Total Fractions Prescribed: 10
Plan Total Fractions Prescribed: 30
Plan Total Prescribed Dose: 30 Gy
Plan Total Prescribed Dose: 60 Gy
Reference Point Dosage Given to Date: 3 Gy
Reference Point Dosage Given to Date: 8 Gy
Reference Point Session Dosage Given: 2 Gy
Reference Point Session Dosage Given: 3 Gy
Session Number: 4

## 2023-07-16 NOTE — Transitions of Care (Post Inpatient/ED Visit) (Signed)
   07/16/2023  Name: Becky [name] MRN: [id number] DOB: 1960-07-12  Today's TOC FU Call Status: Today's TOC FU Call Status:: Unsuccessful Call (2nd Attempt) Unsuccessful Call (1st Attempt) Date: 07/15/23 Unsuccessful Call (2nd Attempt) Date: 07/16/23  Attempted to reach the patient regarding the most recent Inpatient/ED visit.  Follow Up Plan: Additional outreach attempts will be made to reach the patient to complete the Transitions of Care (Post Inpatient/ED visit) call.   Medford Balboa, BSN, RN [place]  VBCI - [employer] [facility] RN Care Manager [phone]

## 2023-07-16 NOTE — Progress Notes (Signed)
 Cardiology Office Note:  .   Date:  07/16/2023  ID:  Becky [name], DOB 1960-03-29, MRN [id number] PCP: [name], MD  [facility] Providers Cardiologist:  Alvan Carrier, MD Electrophysiologist:  OLE ONEIDA HOLTS, MD  PV Cardiologist:  Gordy Bergamo, MD   History of Present Illness: .   Becky [name] is a 63 y.o. 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  She has remote history of smoking otherwise no other illicit drug use.  Now referred to me for evaluation of peripheral arterial disease in view of symptoms suggestive of claudication underwent lower extremity arterial duplex and ABI on 06/16/2023.  Unfortunately she presented to the emergency room on 07/12/2023 and discharged 2 days later with left upper extremity paresthesia and diagnosed with acute CVA and bilateral hemispheres and left cerebellum after Xarelto  was held for 2 days a week before for PICC line placement.  Discussed the use of AI scribe software for clinical note transcription with the patient, who gave verbal consent to proceed.  History of Present Illness Becky [name] is a 63 year old female with peripheral arterial disease and a history of strokes who presents with leg pain and claudication. She is accompanied by her son and cousin. She was referred by Almarie Crate, NP, for evaluation of peripheral arterial  disease.  Becky [name] has experienced leg pain and claudication for five to six months. The pain is localized to her calves, occurring during ambulation and alleviating with rest. Initially more pronounced in the left leg, the pain has progressed to the right leg, which is now more problematic.  However since her visit previously with Ms. Crate, her activity has increased with less claudication symptoms and has coincided with a weight reduction from 276 pounds to 245 pounds.  She has diabetes and previously smoked but has since quit. he has a history of gastrointestinal bleeding and is currently on blood thinners. Approximately four to six days ago, she experienced a stroke after temporarily discontinuing blood thinners for a port procedure.  Labs   Lab Results  Component Value Date   CHOL 161 07/13/2023   HDL 49 07/13/2023   LDLCALC 94 07/13/2023   TRIG 89 07/13/2023   CHOLHDL 3.3 07/13/2023   No results found for: LIPOA  Lab Results  Component Value Date   NA 140 07/13/2023   K 4.2 07/13/2023   CO2 23 07/13/2023   GLUCOSE 137 (H) 07/13/2023   BUN 20 07/13/2023   CREATININE 1.24 (H) 07/13/2023   CALCIUM  9.7 07/13/2023   EGFR 63 03/12/2021   GFRNONAA 49 (L) 07/13/2023      Latest Ref Rng & Units 07/13/2023    4:00 AM 07/12/2023    7:19 PM 06/30/2023    1:29 PM  BMP  Glucose 70 - 99 mg/dL 862  892  847   BUN 8 - 23 mg/dL 20  18  19    Creatinine 0.44 - 1.00 mg/dL 8.75  8.72  8.69  Sodium 135 - 145 mmol/L 140  140  136   Potassium 3.5 - 5.1 mmol/L 4.2  4.0  4.1   Chloride 98 - 111 mmol/L 105  103  104   CO2 22 - 32 mmol/L 23  25  26    Calcium  8.9 - 10.3 mg/dL 9.7  9.8  89.7       Latest Ref Rng & Units 07/14/2023    8:33 AM 07/13/2023    4:00 AM 07/12/2023    7:19 PM  CBC  WBC 4.0 - 10.5 K/uL 5.3  6.5  6.9   Hemoglobin 12.0 - 15.0 g/dL 88.7  88.2  87.4   Hematocrit 36.0 - 46.0 % 33.4  34.7  37.9   Platelets 150 - 400 K/uL 187  182  191    Lab Results  Component Value  Date   HGBA1C 5.9 (H) 04/07/2023    Lab Results  Component Value Date   TSH 2.499 01/27/2023    ROS  Review of Systems  Cardiovascular:  Positive for claudication (bilateral calves). Negative for chest pain, dyspnea on exertion and leg swelling.   Physical Exam:   VS:  BP 102/67 (BP Location: Left Arm, Patient Position: Sitting)   Pulse (!) 112   Ht 5' 7 (1.702 m)   Wt 245 lb (111.1 kg)   SpO2 97%   BMI 38.37 kg/m    Wt Readings from Last 3 Encounters:  07/16/23 245 lb (111.1 kg)  07/12/23 243 lb (110.2 kg)  07/06/23 245 lb (111.1 kg)    Physical Exam Constitutional:      Appearance: She is obese.  Neck:     Vascular: No carotid bruit or JVD.  Cardiovascular:     Rate and Rhythm: Normal rate and regular rhythm.     Pulses: Intact distal pulses.          Popliteal pulses are 0 on the right side and 0 on the left side.       Dorsalis pedis pulses are 1+ on the right side and 0 on the left side.       Posterior tibial pulses are 0 on the right side and 0 on the left side.     Heart sounds: Normal heart sounds. No murmur heard.    No gallop.  Pulmonary:     Effort: Pulmonary effort is normal.     Breath sounds: Normal breath sounds.  Abdominal:     General: Bowel sounds are normal.     Palpations: Abdomen is soft.  Musculoskeletal:     Right lower leg: No edema.     Left lower leg: No edema.    Studies Reviewed: SABRA    Lower Extremity Arterial Duplex 06/16/2023: Right: Atherosclerotic plaque noted throughout the left lower extremity with dampened monphasic doppler waveforms starting at the level of the popliteal artery. May suggest hemodynamically significant stenosis at the popliteal level. Right ABI mildly reduced at 0.75  Left: Atherosclerotic plaque noted throughout the left lower extremity with dampened monphasic doppler waveforms starting at the level of the popliteal artery. May suggest hemodynamically significant stenosis at the popliteal artery level. Left ABI  moderately reduced at 0.55.  Suggest follow up study in 12 months. Suggest Peripheral Vascular Consult.   EKG:         Medications ordered    No orders of the defined types were placed in this encounter.    ASSESSMENT AND PLAN: .      ICD-10-CM   1.  Claudication in peripheral vascular disease (HCC)  I73.9     2. PAF (paroxysmal atrial fibrillation) (HCC)  I48.0     3. H/O: GI bleed  Z87.19     4. Non-small cell cancer of right lung [facility])  C34.91      Assessment & Plan Peripheral Arterial Disease Significant blockages in the popliteal arteries of both legs, with more severe reduction in the left leg. Symptoms include claudication in the calves, improving with rest. Present for 5-6 months. Weight loss and increased physical activity have led to some improvement. Condition not severe enough for intervention like stenting; focus on conservative management. - Encourage weight loss and regular walking to improve circulation. - Monitor for worsening symptoms such as inability to do activities of daily living or legs turning purple and cold, which would require immediate intervention. - Presently on anticoagulation with Xarelto  20 mg daily, no indication for adding aspirin  as it would certainly increase her risk of GI bleed.  Also intervening will need use of antiplatelet therapy on top of anticoagulant therapy making management extremely complex and difficult.  Recent Stroke Recent stroke occurred approximately 4-6 days ago. Currently on Xarelto  to prevent further strokes.  Stroke followed holding Xarelto  for placing PICC line for chemotherapy.  History of GI bleeding complicates anticoagulation management. - Continue blood thinners as prescribed.  Atrial Fibrillation Chronic atrial fibrillation managed conservatively without cardioversion. Discussion of Watchman device deferred due to recent stroke and anticoagulation needs. Candidate for Watchman device, but not advisable immediately  post-stroke.  Also consideration regarding stage IV lung cancer, non-small cell needs to be kept in mind and overall long-term prognosis. -Anticoagulation also becomes an issue as she has brain mets and can potentially be detrimental with increased risk of intracranial bleed as well.  However mets felt to be small and no contraindication for anticoagulation. - Reassess candidacy for Watchman device as we go along.  Stage 4 Lung Cancer with Brain Metastases Stage 4 lung cancer with metastases to the brain and sixth rib. Prognosis is poor, with multiple small brain metastases identified. Radiation therapy to the brain is planned. Emphasis on realistic goals and quality of life considerations. - Proceed with planned radiation therapy to the brain. - Discuss and consider quality of life and treatment goals.  Diabetes Mellitus Diabetes management is crucial in the context of peripheral arterial disease and overall health. Weight loss and lifestyle modifications are beneficial. - Continue diabetes management with a focus on weight loss and lifestyle modifications.   Patient's son is present at the bedside and all questions answered.  I will see her back on a as needed basis.  Signed,  Gordy Bergamo, MD, [facility] 07/16/2023, 2:02 PM [facility] [address] [place], [place] [address] Phone: [phone]. Fax:  [phone]

## 2023-07-16 NOTE — Progress Notes (Signed)
 Returned call to patient from voicemail left while on PAL.  Introduced myself as Dance movement psychotherapist and to follow up on concern wanting to apply for financial assistance. Patient is insured and does not qualify to apply for [employer] at this time. Asked if she has applied for SNAP benefits and she states she has not and asked does she need to come in to apply. Advised to contact Department of Social Services to apply and to follow back up with me if she is approved. She verbalized understanding.  She has my card for any additional financial questions or concerns.

## 2023-07-16 NOTE — Patient Instructions (Signed)
 Medication Instructions:  Your physician recommends that you continue on your current medications as directed. Please refer to the Current Medication list given to you today.  *If you need a refill on your cardiac medications before your next appointment, please call your pharmacy*  Lab Work: none If you have labs (blood work) drawn today and your tests are completely normal, you will receive your results only by: MyChart Message (if you have MyChart) OR A paper copy in the mail If you have any lab test that is abnormal or we need to change your treatment, we will call you to review the results.  Testing/Procedures: none  Follow-Up: At [facility], you and your health needs are our priority.  As part of our continuing mission to provide you with exceptional heart care, our providers are all part of one team.  This team includes your primary Cardiologist (physician) and Advanced Practice Providers or APPs (Physician Assistants and Nurse Practitioners) who all work together to provide you with the care you need, when you need it.  Your next appointment:   As needed  Provider:   Dr Berry Bristol    We recommend signing up for the patient portal called "MyChart".  Sign up information is provided on this After Visit Summary.  MyChart is used to connect with patients for Virtual Visits (Telemedicine).  Patients are able to view lab/test results, encounter notes, upcoming appointments, etc.  Non-urgent messages can be sent to your provider as well.   To learn more about what you can do with MyChart, go to [email or web address].   Other Instructions

## 2023-07-17 ENCOUNTER — Telehealth: Payer: Self-pay

## 2023-07-17 ENCOUNTER — Ambulatory Visit
Admission: RE | Admit: 2023-07-17 | Discharge: 2023-07-17 | Disposition: A | Discharge status: Home / Self Care | Source: Ambulatory Visit | Attending: Radiation Oncology | Admitting: Radiation Oncology

## 2023-07-17 ENCOUNTER — Other Ambulatory Visit: Payer: Self-pay

## 2023-07-17 ENCOUNTER — Ambulatory Visit

## 2023-07-17 DIAGNOSIS — C3491 Malignant neoplasm of unspecified part of right bronchus or lung: Secondary | ICD-10-CM | POA: Diagnosis not present

## 2023-07-17 LAB — RAD ONC ARIA SESSION SUMMARY
Course Elapsed Days: 9
Plan Fractions Treated to Date: 2
Plan Fractions Treated to Date: 5
Plan Prescribed Dose Per Fraction: 2 Gy
Plan Prescribed Dose Per Fraction: 3 Gy
Plan Total Fractions Prescribed: 10
Plan Total Fractions Prescribed: 30
Plan Total Prescribed Dose: 30 Gy
Plan Total Prescribed Dose: 60 Gy
Reference Point Dosage Given to Date: 10 Gy
Reference Point Dosage Given to Date: 6 Gy
Reference Point Session Dosage Given: 2 Gy
Reference Point Session Dosage Given: 3 Gy
Session Number: 5

## 2023-07-17 NOTE — Transitions of Care (Post Inpatient/ED Visit) (Signed)
   07/17/2023  Name: Becky [name] MRN: [id number] DOB: [birth year]  Today's TOC FU Call Status: Today's TOC FU Call Status:: Unsuccessful Call (3rd Attempt) Unsuccessful Call (1st Attempt) Date: 07/15/23 Unsuccessful Call (2nd Attempt) Date: 07/16/23 Unsuccessful Call (3rd Attempt) Date: 07/17/23  Attempted to reach the patient regarding the most recent Inpatient/ED visit.  Follow Up Plan: No further outreach attempts will be made at this time. We have been unable to contact the patient.  Medford Balboa, BSN, RN [place]  [facility] - [employer] [facility] RN Care Manager [phone]

## 2023-07-17 NOTE — Telephone Encounter (Signed)
 07/08/2023 Becky [name] daughter Becky [name] in to see this nurse.  Provided copies of FMLA forms as requested.   We did not know her treatment schedule when she was seen.  Radiation is five days a week.  It is just two daughters caring for her so two days is not enough.  My leave has been approved but both occurrences need to be changed.  Becky works at [employer] in [employer].  07/13/2023: This nurse updated [employer] form Part B number 4 to read inpatient care admission 07/12/2023 to current/Ongoing and Part C question number 13c to read 1-3 occurrences lasting 1-2 days each.   Also updated Becky [name]'s  Form [id number], Part A number 5 to read patient has been admitted and Part B, number 10 to read episodes of incapacity up to 3-days per week lasting 1-2 days.  Obtained fax number for [employer] in [place].     Voicemail requesting return call about my FMLA.  Returned today was a status check only.  Shared above information.  Denies further questions or needs

## 2023-07-19 NOTE — Progress Notes (Unsigned)
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 clear. IMPRESSION: Numerous enhancing lesions in the cerebral hemispheres and cerebellum concerning for intracranial metastatic disease. Dominant lesion in the left cerebellum demonstrates surrounding enhancement and mild local mass effect. No midline shift. Scattered foci of diffusion signal abnormality throughout the cerebral hemispheres and left cerebellum some of which correspond to enhancing lesions. However the majority of these foci  likely reflect acute/subacute infarct likely of embolic etiology. Mild chronic microvascular ischemic changes. 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 Electronically Signed   By: Donnice Mania M.D.   On: 07/12/2023 21:02   DG Chest Portable 1 View Result Date: 07/12/2023 CLINICAL DATA:  Chest pain EXAM: PORTABLE CHEST 1 VIEW COMPARISON:  06/04/2023, CT 05/28/2023 FINDINGS: Left-sided central venous port tip over the SVC. Low lung volumes. Borderline cardiac enlargement. Vague right upper lobe opacity, presumably corresponds to history of CT demonstrated lung mass. IMPRESSION: Low lung volumes. Vague right upper lobe opacity, presumably corresponds to history of CT demonstrated lung mass. Electronically Signed   By: Luke Bun M.D.   On: 07/12/2023 19:14   IR IMAGING GUIDED PORT INSERTION Result Date: 07/06/2023 CLINICAL DATA:  Right upper lobe lung carcinoma and need for porta cath for chemotherapy. EXAM: IMPLANTED PORT A CATH PLACEMENT WITH ULTRASOUND AND FLUOROSCOPIC GUIDANCE ANESTHESIA/SEDATION: Moderate (conscious) sedation was employed during this procedure. A total of Versed  2.0 mg and Fentanyl  100 mcg was administered intravenously. Moderate Sedation Time: 40 minutes. The patient's level of consciousness and vital signs were monitored continuously by radiology nursing throughout the procedure under my direct supervision. FLUOROSCOPY: Radiation Exposure Index: 7.1 mGy Kerma PROCEDURE: The procedure, risks, benefits, and alternatives were explained to the patient. Questions regarding the procedure were encouraged and answered. The patient understands and consents to the procedure. A time-out was performed prior to initiating the procedure. Ultrasound was utilized to confirm patency of the left internal jugular vein. An ultrasound image was saved and recorded. The left neck and chest were prepped with chlorhexidine  in a sterile fashion, and a sterile drape was applied covering the operative field. Maximum barrier sterile technique with sterile gowns and gloves were used for the procedure. Local anesthesia was provided with 1% lidocaine . After creating a small venotomy incision, a 21 gauge needle was advanced into the left internal jugular vein under direct, real-time ultrasound guidance. Ultrasound image documentation was performed. After securing guidewire access, an 8 Fr dilator was placed. A J-wire was kinked to measure appropriate catheter length. A subcutaneous port pocket was then created along the upper chest wall utilizing sharp and blunt dissection. Portable cautery was utilized. The pocket was irrigated with sterile saline. A single lumen power injectable port was chosen for placement. The 8 Fr catheter was  tunneled from the port pocket site to the venotomy incision. The port was placed in the pocket. External catheter was trimmed to appropriate length based on guidewire measurement. At the venotomy, an 8 Fr peel-away sheath was placed over a guidewire. The catheter was then placed through the sheath and the sheath removed. Final catheter positioning was confirmed and documented with a fluoroscopic spot image. The port was accessed with a needle and aspirated and flushed with heparinized saline. The access needle was removed. The venotomy and port pocket incisions were closed with subcutaneous 3-0 Monocryl and subcuticular 4-0 Vicryl. Dermabond was applied to both incisions. COMPLICATIONS: COMPLICATIONS None FINDINGS: After catheter placement, the tip lies at the cavo-atrial junction. The catheter aspirates normally and is ready for immediate use. IMPRESSION: Placement of single lumen port a cath via left internal jugular vein. The catheter tip lies at the cavo-atrial junction. A power injectable port a cath was placed and is ready for immediate use. Electronically Signed   By: Marcey Moan M.D.   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 This was diagnosed and May 2025.  Molecular studies: High TMB and met  The original plan was to undergo concurrent chemoradiation with weekly carboplatin for an AUC of 2 and paclitaxel 45 mg/m the first dose was expected on 07/13/2023 but the patient presented to the emergency room with a CVA and was found to have metastatic disease to the brain.  She is currently undergoing radiation to the right rib, right lung bronchus, and  brain.  The patient was seen with Dr. Sherrod today.  Dr. Sherrod had a lengthy discussion with the patient today about her current condition and modifications in her treatment.  Considering she is stage IV, Dr. Sherrod recommends discontinuing her weekly chemotherapy and modifying her treatment plan to include chemotherapy and immunotherapy with carboplatin for AUC of 5, Alimta 500 mg/m, Keytruda 200 mg IV every 3 weeks.  Dr. Sherrod discussed that her condition is treatable but not curable.  She is interested in this option and she is expected to undergo her first cycle of treatment on 07/27/2023.  I will reach out to radiation oncology to discuss the modification in the treatment plan.  She will continue with her radiation.  We will see her back for follow-up visit in 2 weeks for a 1 week follow-up visit and to manage any adverse side effects of treatment.  Steroids?  Pain?  She will continue her xarelto  for her atrial fibrillation and Hx CVA  The patient was advised to call immediately if she has any concerning symptoms in the interval. The patient voices understanding of current disease status and treatment options and is in agreement with the current care plan. All questions were answered. The patient knows to call the clinic with any problems, questions or concerns. We can certainly see the patient much sooner if necessary   No orders of the defined types were placed in this encounter.    I spent {CHL ONC TIME VISIT - DTPQU:[id number]} counseling the patient face to face. The total time spent in the appointment was {CHL ONC TIME VISIT - DTPQU:[id number]}.  Jermayne Sweeney L Margean Korell, PA-C 07/19/23

## 2023-07-20 ENCOUNTER — Telehealth: Payer: Self-pay

## 2023-07-20 ENCOUNTER — Telehealth: Payer: Self-pay | Admitting: *Deleted

## 2023-07-20 ENCOUNTER — Encounter: Payer: Self-pay | Admitting: Neurology

## 2023-07-20 ENCOUNTER — Inpatient Hospital Stay

## 2023-07-20 ENCOUNTER — Ambulatory Visit
Admission: RE | Admit: 2023-07-20 | Discharge: 2023-07-20 | Disposition: A | Discharge status: Home / Self Care | Source: Ambulatory Visit | Attending: Radiation Oncology | Admitting: Radiation Oncology

## 2023-07-20 ENCOUNTER — Ambulatory Visit

## 2023-07-20 ENCOUNTER — Other Ambulatory Visit: Payer: Self-pay

## 2023-07-20 ENCOUNTER — Inpatient Hospital Stay (HOSPITAL_BASED_OUTPATIENT_CLINIC_OR_DEPARTMENT_OTHER): Admitting: Physician Assistant

## 2023-07-20 VITALS — BP 128/59 | HR 83 | Temp 97.8°F | Resp 18 | Ht 67.0 in | Wt 242.9 lb

## 2023-07-20 DIAGNOSIS — Z7189 Other specified counseling: Secondary | ICD-10-CM | POA: Diagnosis not present

## 2023-07-20 DIAGNOSIS — C3491 Malignant neoplasm of unspecified part of right bronchus or lung: Secondary | ICD-10-CM

## 2023-07-20 DIAGNOSIS — Z95828 Presence of other vascular implants and grafts: Secondary | ICD-10-CM | POA: Insufficient documentation

## 2023-07-20 DIAGNOSIS — I639 Cerebral infarction, unspecified: Secondary | ICD-10-CM

## 2023-07-20 LAB — RAD ONC ARIA SESSION SUMMARY
Course Elapsed Days: 12
Plan Fractions Treated to Date: 3
Plan Fractions Treated to Date: 6
Plan Prescribed Dose Per Fraction: 2 Gy
Plan Prescribed Dose Per Fraction: 3 Gy
Plan Total Fractions Prescribed: 10
Plan Total Fractions Prescribed: 30
Plan Total Prescribed Dose: 30 Gy
Plan Total Prescribed Dose: 60 Gy
Reference Point Dosage Given to Date: 12 Gy
Reference Point Dosage Given to Date: 9 Gy
Reference Point Session Dosage Given: 2 Gy
Reference Point Session Dosage Given: 3 Gy
Session Number: 6

## 2023-07-20 LAB — CMP (CANCER CENTER ONLY)
ALT: 12 U/L (ref 0–44)
AST: 15 U/L (ref 15–41)
Albumin: 4.3 g/dL (ref 3.5–5.0)
Alkaline Phosphatase: 79 U/L (ref 38–126)
Anion gap: 6 (ref 5–15)
BUN: 19 mg/dL (ref 8–23)
CO2: 27 mmol/L (ref 22–32)
Calcium: 10.3 mg/dL (ref 8.9–10.3)
Chloride: 105 mmol/L (ref 98–111)
Creatinine: 1.23 mg/dL — ABNORMAL HIGH (ref 0.44–1.00)
GFR, Estimated: 50 mL/min — ABNORMAL LOW (ref >60)
Glucose, Bld: 125 mg/dL — ABNORMAL HIGH (ref 70–99)
Potassium: 4.1 mmol/L (ref 3.5–5.1)
Sodium: 138 mmol/L (ref 135–145)
Total Bilirubin: 0.6 mg/dL (ref 0.0–1.2)
Total Protein: 7 g/dL (ref 6.5–8.1)

## 2023-07-20 LAB — CBC WITH DIFFERENTIAL (CANCER CENTER ONLY)
Abs Immature Granulocytes: 0.01 K/uL (ref 0.00–0.07)
Basophils Absolute: 0 K/uL (ref 0.0–0.1)
Basophils Relative: 0 %
Eosinophils Absolute: 0.1 K/uL (ref 0.0–0.5)
Eosinophils Relative: 2 %
HCT: 37.4 % (ref 36.0–46.0)
Hemoglobin: 12.7 g/dL (ref 12.0–15.0)
Immature Granulocytes: 0 %
Lymphocytes Relative: 23 %
Lymphs Abs: 1.3 K/uL (ref 0.7–4.0)
MCH: 29.5 pg (ref 26.0–34.0)
MCHC: 34 g/dL (ref 30.0–36.0)
MCV: 87 fL (ref 80.0–100.0)
Monocytes Absolute: 0.6 K/uL (ref 0.1–1.0)
Monocytes Relative: 10 %
Neutro Abs: 3.8 K/uL (ref 1.7–7.7)
Neutrophils Relative %: 65 %
Platelet Count: 219 K/uL (ref 150–400)
RBC: 4.3 MIL/uL (ref 3.87–5.11)
RDW: 12.7 % (ref 11.5–15.5)
WBC Count: 5.8 K/uL (ref 4.0–10.5)
nRBC: 0 % (ref 0.0–0.2)

## 2023-07-20 MED ORDER — SODIUM CHLORIDE 0.9% FLUSH
10.0000 mL | Freq: Once | INTRAVENOUS | Status: AC
Start: 2023-07-20 — End: 2023-07-20
  Administered 2023-07-20: 10 mL

## 2023-07-20 MED ORDER — CYANOCOBALAMIN 1000 MCG/ML IJ SOLN: 1000.0000 ug | Freq: Once | INTRAMUSCULAR | Status: DC

## 2023-07-20 MED ORDER — CYANOCOBALAMIN 1000 MCG/ML IJ SOLN
1000.0000 ug | Freq: Once | INTRAMUSCULAR | Status: AC
  Administered 2023-07-20: 1000 ug via INTRAMUSCULAR
  Filled 2023-07-20: qty 1

## 2023-07-20 NOTE — Patient Instructions (Addendum)
Summary:  -There are two main categories of lung cancer, they are named based on the size of the cancer cell. One is called Non-Small cell lung cancer. The other type is Small Cell Lung Cancer -The sample (biopsy) that they took of your tumor was consistent with a subtype of Non-small cell lung cancer called Adenocarcinoma. This is the most common type of lung cancer.  -We covered a lot of important information at your appointment today regarding what the treatment plan is moving forward. Here are the the main points that were discussed at your office visit with [place] today:  -The treatment that you will receive consists of two chemotherapy drugs, called Carboplatin and Alimta (also called Pemetrexed) and one immunotherapy drug called Keytruda (pembrolizumab).  -We are planning on starting your treatment next week on _/_/_ but before your start your treatment, I would like you to attend a Chemotherapy Education Class. This involves having you sit down with one of our nurse educators. She will discuss with your one-on-one more details about your treatment as well as general information about resources here at the cancer center.  -Your treatment will be given once every 3 weeks. We will check your labs once a week for the first ~5 treatments just to make sure that important components of your blood are in an acceptable range -We will get a CT scan after 3 treatments to check on the progress of treatment  Medications:  -I have sent a few important medication prescriptions to your pharmacy.  -Compazine was sent to your pharmacy. This medication is for nausea. You may take this every 6 hours as needed if you feel nauseous.  -I have also sent a prescription for 1 mg of folic acid to your pharmacy. We need you to take 1 tablet every day.  -We will administer vitamin B12 every 9 weeks while you are here in the clinic. You have received your first dose today.   Referrals or Imaging:   Follow up:  -We will see  you back for a follow up visit 1 week after your first treatment to see how it went and help manage any side effects of treatment that you may have   -If you need to reach [place] at any time, the main office number to the cancer center is [phone], when you call, ask to speak to either Cassie's or Dr. Worthy Flank nurse.

## 2023-07-20 NOTE — Patient Outreach (Signed)
 Stroke Discharge Follow-up   07/20/2023 Name:  Becky [name] MRN:  [id number] DOB:  Oct 11, 1960  Subjective: Becky [name] is a 63 y.o. year old female who is a primary care patient of [name], MD An Emmi alert was received indicating patient responded to questions: Scheduled a follow-up appointment?. I reached out by phone to follow up on the alert and spoke to Patient.  Care Management Interventions: RN CM offered assistance with making post hospital follow up appointment, pt declined, states she is able and will call today and schedule.  Follow up plan: No further intervention required.   [name] [facility], BSN RN Care Manager/ Transition of Care [place]/ [facility] [phone]

## 2023-07-21 ENCOUNTER — Other Ambulatory Visit: Payer: Self-pay

## 2023-07-21 ENCOUNTER — Ambulatory Visit
Admission: RE | Admit: 2023-07-21 | Discharge: 2023-07-21 | Disposition: A | Discharge status: Home / Self Care | Source: Ambulatory Visit | Attending: Radiation Oncology | Admitting: Radiation Oncology

## 2023-07-21 DIAGNOSIS — C3491 Malignant neoplasm of unspecified part of right bronchus or lung: Secondary | ICD-10-CM | POA: Diagnosis not present

## 2023-07-21 LAB — RAD ONC ARIA SESSION SUMMARY
Course Elapsed Days: 13
Plan Fractions Treated to Date: 4
Plan Fractions Treated to Date: 7
Plan Prescribed Dose Per Fraction: 2 Gy
Plan Prescribed Dose Per Fraction: 3 Gy
Plan Total Fractions Prescribed: 10
Plan Total Fractions Prescribed: 30
Plan Total Prescribed Dose: 30 Gy
Plan Total Prescribed Dose: 60 Gy
Reference Point Dosage Given to Date: 12 Gy
Reference Point Dosage Given to Date: 14 Gy
Reference Point Session Dosage Given: 2 Gy
Reference Point Session Dosage Given: 3 Gy
Session Number: 7

## 2023-07-22 ENCOUNTER — Ambulatory Visit
Admission: RE | Admit: 2023-07-22 | Discharge: 2023-07-22 | Disposition: A | Discharge status: Home / Self Care | Source: Ambulatory Visit | Attending: Radiation Oncology | Admitting: Radiation Oncology

## 2023-07-22 ENCOUNTER — Other Ambulatory Visit: Payer: Self-pay

## 2023-07-22 ENCOUNTER — Ambulatory Visit: Admitting: Internal Medicine

## 2023-07-22 ENCOUNTER — Encounter: Payer: Self-pay | Admitting: Internal Medicine

## 2023-07-22 ENCOUNTER — Other Ambulatory Visit

## 2023-07-22 DIAGNOSIS — C3491 Malignant neoplasm of unspecified part of right bronchus or lung: Secondary | ICD-10-CM | POA: Diagnosis not present

## 2023-07-22 LAB — RAD ONC ARIA SESSION SUMMARY
Course Elapsed Days: 14
Plan Fractions Treated to Date: 1
Plan Fractions Treated to Date: 5
Plan Prescribed Dose Per Fraction: 2 Gy
Plan Prescribed Dose Per Fraction: 3 Gy
Plan Total Fractions Prescribed: 10
Plan Total Fractions Prescribed: 6
Plan Total Prescribed Dose: 12 Gy
Plan Total Prescribed Dose: 30 Gy
Reference Point Dosage Given to Date: 15 Gy
Reference Point Dosage Given to Date: 16 Gy
Reference Point Session Dosage Given: 2 Gy
Reference Point Session Dosage Given: 3 Gy
Session Number: 8

## 2023-07-23 ENCOUNTER — Encounter (HOSPITAL_COMMUNITY): Payer: Self-pay

## 2023-07-23 ENCOUNTER — Ambulatory Visit
Admission: RE | Admit: 2023-07-23 | Discharge: 2023-07-23 | Discharge status: Home / Self Care | Source: Ambulatory Visit | Attending: Radiation Oncology | Admitting: Radiation Oncology

## 2023-07-23 ENCOUNTER — Other Ambulatory Visit: Payer: Self-pay | Admitting: Radiation Oncology

## 2023-07-23 ENCOUNTER — Ambulatory Visit

## 2023-07-23 ENCOUNTER — Emergency Department (HOSPITAL_COMMUNITY)

## 2023-07-23 ENCOUNTER — Observation Stay (HOSPITAL_COMMUNITY)
Admission: EM | Admit: 2023-07-23 | Discharge: 2023-07-25 | Disposition: A | Discharge status: Home / Self Care | Attending: Internal Medicine | Admitting: Internal Medicine

## 2023-07-23 ENCOUNTER — Other Ambulatory Visit: Payer: Self-pay

## 2023-07-23 DIAGNOSIS — E785 Hyperlipidemia, unspecified: Secondary | ICD-10-CM | POA: Diagnosis not present

## 2023-07-23 DIAGNOSIS — E66812 Obesity, class 2: Secondary | ICD-10-CM | POA: Diagnosis not present

## 2023-07-23 DIAGNOSIS — R202 Paresthesia of skin: Secondary | ICD-10-CM | POA: Diagnosis present

## 2023-07-23 DIAGNOSIS — Z6838 Body mass index (BMI) 38.0-38.9, adult: Secondary | ICD-10-CM | POA: Insufficient documentation

## 2023-07-23 DIAGNOSIS — C3411 Malignant neoplasm of upper lobe, right bronchus or lung: Secondary | ICD-10-CM | POA: Diagnosis not present

## 2023-07-23 DIAGNOSIS — C7931 Secondary malignant neoplasm of brain: Secondary | ICD-10-CM | POA: Diagnosis not present

## 2023-07-23 DIAGNOSIS — K219 Gastro-esophageal reflux disease without esophagitis: Secondary | ICD-10-CM | POA: Insufficient documentation

## 2023-07-23 DIAGNOSIS — G4733 Obstructive sleep apnea (adult) (pediatric): Secondary | ICD-10-CM | POA: Insufficient documentation

## 2023-07-23 DIAGNOSIS — G459 Transient cerebral ischemic attack, unspecified: Principal | ICD-10-CM | POA: Insufficient documentation

## 2023-07-23 DIAGNOSIS — I4891 Unspecified atrial fibrillation: Principal | ICD-10-CM | POA: Diagnosis present

## 2023-07-23 DIAGNOSIS — R7989 Other specified abnormal findings of blood chemistry: Secondary | ICD-10-CM | POA: Diagnosis present

## 2023-07-23 DIAGNOSIS — R29818 Other symptoms and signs involving the nervous system: Secondary | ICD-10-CM | POA: Diagnosis present

## 2023-07-23 DIAGNOSIS — C3490 Malignant neoplasm of unspecified part of unspecified bronchus or lung: Secondary | ICD-10-CM | POA: Diagnosis present

## 2023-07-23 DIAGNOSIS — E1169 Type 2 diabetes mellitus with other specified complication: Secondary | ICD-10-CM | POA: Diagnosis present

## 2023-07-23 DIAGNOSIS — C7951 Secondary malignant neoplasm of bone: Secondary | ICD-10-CM

## 2023-07-23 DIAGNOSIS — Z8673 Personal history of transient ischemic attack (TIA), and cerebral infarction without residual deficits: Secondary | ICD-10-CM

## 2023-07-23 DIAGNOSIS — I10 Essential (primary) hypertension: Secondary | ICD-10-CM | POA: Insufficient documentation

## 2023-07-23 DIAGNOSIS — E119 Type 2 diabetes mellitus without complications: Secondary | ICD-10-CM | POA: Insufficient documentation

## 2023-07-23 HISTORY — DX: Cerebral infarction, unspecified: I63.9

## 2023-07-23 LAB — RAD ONC ARIA SESSION SUMMARY
Course Elapsed Days: 15
Plan Fractions Treated to Date: 1
Plan Fractions Treated to Date: 2
Plan Fractions Treated to Date: 6
Plan Prescribed Dose Per Fraction: 2 Gy
Plan Prescribed Dose Per Fraction: 3 Gy
Plan Prescribed Dose Per Fraction: 4 Gy
Plan Total Fractions Prescribed: 10
Plan Total Fractions Prescribed: 5
Plan Total Fractions Prescribed: 6
Plan Total Prescribed Dose: 12 Gy
Plan Total Prescribed Dose: 20 Gy
Plan Total Prescribed Dose: 30 Gy
Reference Point Dosage Given to Date: 18 Gy
Reference Point Dosage Given to Date: 18 Gy
Reference Point Dosage Given to Date: 4 Gy
Reference Point Session Dosage Given: 2 Gy
Reference Point Session Dosage Given: 3 Gy
Reference Point Session Dosage Given: 4 Gy
Session Number: 9

## 2023-07-23 LAB — URINALYSIS, ROUTINE W REFLEX MICROSCOPIC
Bilirubin Urine: NEGATIVE
Glucose, UA: NEGATIVE mg/dL
Ketones, ur: NEGATIVE mg/dL
Leukocytes,Ua: NEGATIVE
Nitrite: NEGATIVE
Protein, ur: NEGATIVE mg/dL
Specific Gravity, Urine: 1.005 (ref 1.005–1.030)
pH: 5 (ref 5.0–8.0)

## 2023-07-23 LAB — CBC WITH DIFFERENTIAL/PLATELET
Abs Immature Granulocytes: 0.02 K/uL (ref 0.00–0.07)
Basophils Absolute: 0 K/uL (ref 0.0–0.1)
Basophils Relative: 0 %
Eosinophils Absolute: 0.2 K/uL (ref 0.0–0.5)
Eosinophils Relative: 4 %
HCT: 36.9 % (ref 36.0–46.0)
Hemoglobin: 12.4 g/dL (ref 12.0–15.0)
Immature Granulocytes: 0 %
Lymphocytes Relative: 21 %
Lymphs Abs: 1.1 K/uL (ref 0.7–4.0)
MCH: 30.3 pg (ref 26.0–34.0)
MCHC: 33.6 g/dL (ref 30.0–36.0)
MCV: 90.2 fL (ref 80.0–100.0)
Monocytes Absolute: 0.6 K/uL (ref 0.1–1.0)
Monocytes Relative: 11 %
Neutro Abs: 3.3 K/uL (ref 1.7–7.7)
Neutrophils Relative %: 64 %
Platelets: 190 K/uL (ref 150–400)
RBC: 4.09 MIL/uL (ref 3.87–5.11)
RDW: 12.7 % (ref 11.5–15.5)
WBC: 5.2 K/uL (ref 4.0–10.5)
nRBC: 0 % (ref 0.0–0.2)

## 2023-07-23 LAB — COMPREHENSIVE METABOLIC PANEL WITH GFR
ALT: 15 U/L (ref 0–44)
AST: 17 U/L (ref 15–41)
Albumin: 4 g/dL (ref 3.5–5.0)
Alkaline Phosphatase: 77 U/L (ref 38–126)
Anion gap: 11 (ref 5–15)
BUN: 16 mg/dL (ref 8–23)
CO2: 23 mmol/L (ref 22–32)
Calcium: 9.9 mg/dL (ref 8.9–10.3)
Chloride: 104 mmol/L (ref 98–111)
Creatinine, Ser: 0.97 mg/dL (ref 0.44–1.00)
GFR, Estimated: >60 mL/min (ref >60)
Glucose, Bld: 86 mg/dL (ref 70–99)
Potassium: 3.8 mmol/L (ref 3.5–5.1)
Sodium: 138 mmol/L (ref 135–145)
Total Bilirubin: 0.8 mg/dL (ref 0.0–1.2)
Total Protein: 6.9 g/dL (ref 6.5–8.1)

## 2023-07-23 LAB — MAGNESIUM: Magnesium: 2 mg/dL (ref 1.7–2.4)

## 2023-07-23 LAB — TROPONIN I (HIGH SENSITIVITY)
Troponin I (High Sensitivity): 98 ng/L — ABNORMAL HIGH (ref <18)
Troponin I (High Sensitivity): 87 ng/L — ABNORMAL HIGH (ref <18)

## 2023-07-23 MED ORDER — IOHEXOL 350 MG/ML SOLN
75.0000 mL | Freq: Once | INTRAVENOUS | Status: AC | PRN
Start: 2023-07-23 — End: 2023-07-23
  Administered 2023-07-23: 75 mL via INTRAVENOUS

## 2023-07-23 MED ORDER — DILTIAZEM HCL 25 MG/5ML IV SOLN
10.0000 mg | Freq: Once | INTRAVENOUS | Status: AC
  Administered 2023-07-23: 10 mg via INTRAVENOUS
  Filled 2023-07-23: qty 5

## 2023-07-23 MED ORDER — SODIUM CHLORIDE 0.9 % IV BOLUS
1000.0000 mL | Freq: Once | INTRAVENOUS | Status: AC
Start: 2023-07-23 — End: 2023-07-24
  Administered 2023-07-23: 1000 mL via INTRAVENOUS

## 2023-07-23 MED ORDER — DEXAMETHASONE 4 MG PO TABS: 4.0000 mg | ORAL_TABLET | Freq: Two times a day (BID) | ORAL | 0 refills | Status: DC

## 2023-07-23 MED ORDER — METOCLOPRAMIDE HCL 5 MG/ML IJ SOLN
5.0000 mg | Freq: Once | INTRAMUSCULAR | Status: AC
  Administered 2023-07-23: 5 mg via INTRAVENOUS
  Filled 2023-07-23: qty 2

## 2023-07-23 MED ORDER — LIDOCAINE-EPINEPHRINE-TETRACAINE (LET) TOPICAL GEL
3.0000 mL | Freq: Once | TOPICAL | Status: AC
  Administered 2023-07-23: 3 mL via TOPICAL
  Filled 2023-07-23: qty 3

## 2023-07-23 MED ORDER — METOPROLOL TARTRATE 50 MG PO TABS
50.0000 mg | ORAL_TABLET | Freq: Once | ORAL | Status: AC
  Administered 2023-07-24: 50 mg via ORAL
  Filled 2023-07-23: qty 1

## 2023-07-23 MED ORDER — DIPHENHYDRAMINE HCL 50 MG/ML IJ SOLN
25.0000 mg | Freq: Once | INTRAMUSCULAR | Status: AC
  Administered 2023-07-23: 25 mg via INTRAVENOUS
  Filled 2023-07-23: qty 1

## 2023-07-23 NOTE — ED Triage Notes (Signed)
 Pt arrived via POV from [facility] after she began to experience left arm tingling and numbness while shopping. Pt reports upon arrival to ER, her symptoms have resolved. Pt reports recent Stroke this month. Pt reports she also received radiation therapy at [facility] earlier today as well. Pt presents in NAD and denies any neurologic changes at this time.

## 2023-07-23 NOTE — ED Triage Notes (Addendum)
 Documented in error.

## 2023-07-23 NOTE — ED Notes (Signed)
 Patient transported to CT

## 2023-07-23 NOTE — ED Provider Notes (Signed)
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 care; findings noted above I agree with the radiologist interpretation If any imaging was obtained with contrast I closely monitored patient for any possible adverse reaction a/w contrast administration in the emergency department   Medicines ordered and prescription drug management: Meds ordered this encounter  Medications   lidocaine -EPINEPHrine -tetracaine  (LET) topical gel   diltiazem  (CARDIZEM ) injection 10 mg   sodium chloride  0.9 % bolus 1,000 mL   iohexol  (OMNIPAQUE ) 350 MG/ML injection 75 mL   metoCLOPramide  (REGLAN ) injection 5 mg   diphenhydrAMINE  (BENADRYL ) injection 25 mg   metoprolol  tartrate (LOPRESSOR ) tablet 50 mg    -I have reviewed the patients home medicines and have made adjustments as needed   Consultations Obtained: I requested consultation with the teleneuro   Cardiac Monitoring: The patient was maintained on a cardiac monitor.  I personally  viewed and interpreted the cardiac monitored which showed an underlying rhythm of: afib rvr > rate controlled  Continuous pulse oximetry interpreted by myself, 98% on ra.    Social Determinants of Health:  Diagnosis or treatment significantly limited by social determinants of health: former smoker   Reevaluation: After the interventions noted above, I reevaluated the patient and found that they have resolved  Co morbidities that complicate the patient evaluation  Past Medical History:  Diagnosis Date   Anemia    Arthritis  Asthma    Atrial fibrillation (HCC) 2018   Back pain    BV (bacterial vaginosis) 05/23/2013   Constipation 11/21/2014   Coronary artery disease    Current use of long term anticoagulation 2018   Diabetes mellitus without complication (HCC)    Dysphagia 2011   Fibroids 03/13/2016   GERD (gastroesophageal reflux disease)    Goiter 2009   Hematochezia 2011   Hematuria 05/23/2013   Hyperlipidemia    Hypertension    Hypothyroidism    LGSIL of cervix of undetermined significance 03/04/2021   03/04/21 +HPV 16/other , will get colpo, per ASCCP guidelines, immediate CIN3+risjk is 5.65%   Migraines    PAF (paroxysmal atrial fibrillation) (HCC)    a. diagnosed in 11/2016 --> started on Xarelto  for anticoagulation   Pelvic pain in female 11/02/2013   Plantar fasciitis of right foot    Pre-diabetes    Sleep apnea    dont use cpap says causes sinus infection   Stroke [facility])       Dispostion: Disposition decision including need for hospitalization was considered, and patient disposition pending at time of sign out.    Final Clinical Impression(s) / ED Diagnoses Final diagnoses:  Atrial fibrillation with RVR (HCC)  Paresthesia of arm        Elnor Jayson LABOR, DO 07/24/23 0021

## 2023-07-23 NOTE — ED Triage Notes (Signed)
 Disregard previous note. Documented in error on incorrect patient

## 2023-07-24 ENCOUNTER — Telehealth: Payer: Self-pay | Admitting: Internal Medicine

## 2023-07-24 ENCOUNTER — Encounter (HOSPITAL_COMMUNITY): Payer: Self-pay | Admitting: Family Medicine

## 2023-07-24 ENCOUNTER — Observation Stay (HOSPITAL_COMMUNITY)

## 2023-07-24 ENCOUNTER — Encounter: Payer: Self-pay | Admitting: Internal Medicine

## 2023-07-24 ENCOUNTER — Telehealth: Payer: Self-pay | Admitting: Neurology

## 2023-07-24 ENCOUNTER — Other Ambulatory Visit: Payer: Self-pay

## 2023-07-24 ENCOUNTER — Ambulatory Visit
Admission: RE | Admit: 2023-07-24 | Discharge: 2023-07-24 | Disposition: A | Discharge status: Home / Self Care | Source: Ambulatory Visit | Attending: Radiation Oncology | Admitting: Radiation Oncology

## 2023-07-24 DIAGNOSIS — E1169 Type 2 diabetes mellitus with other specified complication: Secondary | ICD-10-CM

## 2023-07-24 DIAGNOSIS — G459 Transient cerebral ischemic attack, unspecified: Secondary | ICD-10-CM | POA: Diagnosis not present

## 2023-07-24 DIAGNOSIS — E785 Hyperlipidemia, unspecified: Secondary | ICD-10-CM

## 2023-07-24 DIAGNOSIS — I4891 Unspecified atrial fibrillation: Secondary | ICD-10-CM | POA: Diagnosis not present

## 2023-07-24 DIAGNOSIS — C3490 Malignant neoplasm of unspecified part of unspecified bronchus or lung: Secondary | ICD-10-CM

## 2023-07-24 DIAGNOSIS — C7931 Secondary malignant neoplasm of brain: Secondary | ICD-10-CM

## 2023-07-24 DIAGNOSIS — R29818 Other symptoms and signs involving the nervous system: Secondary | ICD-10-CM | POA: Diagnosis not present

## 2023-07-24 DIAGNOSIS — Z8673 Personal history of transient ischemic attack (TIA), and cerebral infarction without residual deficits: Secondary | ICD-10-CM

## 2023-07-24 DIAGNOSIS — R7989 Other specified abnormal findings of blood chemistry: Secondary | ICD-10-CM | POA: Diagnosis present

## 2023-07-24 LAB — BASIC METABOLIC PANEL WITH GFR
Anion gap: 8 (ref 5–15)
BUN: 14 mg/dL (ref 8–23)
CO2: 22 mmol/L (ref 22–32)
Calcium: 9.4 mg/dL (ref 8.9–10.3)
Chloride: 109 mmol/L (ref 98–111)
Creatinine, Ser: 1.11 mg/dL — ABNORMAL HIGH (ref 0.44–1.00)
GFR, Estimated: 56 mL/min — ABNORMAL LOW (ref >60)
Glucose, Bld: 141 mg/dL — ABNORMAL HIGH (ref 70–99)
Potassium: 3.5 mmol/L (ref 3.5–5.1)
Sodium: 139 mmol/L (ref 135–145)

## 2023-07-24 LAB — RAD ONC ARIA SESSION SUMMARY
Course Elapsed Days: 16
Plan Fractions Treated to Date: 2
Plan Fractions Treated to Date: 3
Plan Fractions Treated to Date: 7
Plan Prescribed Dose Per Fraction: 2 Gy
Plan Prescribed Dose Per Fraction: 3 Gy
Plan Prescribed Dose Per Fraction: 4 Gy
Plan Total Fractions Prescribed: 10
Plan Total Fractions Prescribed: 5
Plan Total Fractions Prescribed: 6
Plan Total Prescribed Dose: 12 Gy
Plan Total Prescribed Dose: 20 Gy
Plan Total Prescribed Dose: 30 Gy
Reference Point Dosage Given to Date: 20 Gy
Reference Point Dosage Given to Date: 21 Gy
Reference Point Dosage Given to Date: 8 Gy
Reference Point Session Dosage Given: 2 Gy
Reference Point Session Dosage Given: 3 Gy
Reference Point Session Dosage Given: 4 Gy
Session Number: 10

## 2023-07-24 LAB — CBC
HCT: 34.6 % — ABNORMAL LOW (ref 36.0–46.0)
Hemoglobin: 11.4 g/dL — ABNORMAL LOW (ref 12.0–15.0)
MCH: 30 pg (ref 26.0–34.0)
MCHC: 32.9 g/dL (ref 30.0–36.0)
MCV: 91.1 fL (ref 80.0–100.0)
Platelets: 170 K/uL (ref 150–400)
RBC: 3.8 MIL/uL — ABNORMAL LOW (ref 3.87–5.11)
RDW: 12.7 % (ref 11.5–15.5)
WBC: 4.8 K/uL (ref 4.0–10.5)
nRBC: 0 % (ref 0.0–0.2)

## 2023-07-24 LAB — ECHOCARDIOGRAM LIMITED BUBBLE STUDY
Calc EF: 56 %
Height: 67 in
S' Lateral: 3.3 cm
Single Plane A2C EF: 51.6 %
Single Plane A4C EF: 59.7 %
Weight: 3886.41 [oz_av]

## 2023-07-24 LAB — HEMOGLOBIN A1C
Hgb A1c MFr Bld: 6 % — ABNORMAL HIGH (ref 4.8–5.6)
Mean Plasma Glucose: 125.5 mg/dL

## 2023-07-24 MED ORDER — ROSUVASTATIN CALCIUM 20 MG PO TABS
20.0000 mg | ORAL_TABLET | Freq: Every day | ORAL | Status: DC
  Administered 2023-07-24 – 2023-07-25 (×2): 20 mg via ORAL
  Filled 2023-07-24: qty 2
  Filled 2023-07-24: qty 1

## 2023-07-24 MED ORDER — LORAZEPAM 1 MG PO TABS
1.0000 mg | ORAL_TABLET | Freq: Once | ORAL | Status: AC | PRN
  Administered 2023-07-25: 1 mg via ORAL
  Filled 2023-07-24: qty 1

## 2023-07-24 MED ORDER — DEXTROMETHORPHAN POLISTIREX ER 30 MG/5ML PO SUER
15.0000 mg | Freq: Four times a day (QID) | ORAL | Status: DC | PRN
  Administered 2023-07-24: 15 mg via ORAL
  Filled 2023-07-24: qty 5

## 2023-07-24 MED ORDER — SPIRONOLACTONE 25 MG PO TABS
25.0000 mg | ORAL_TABLET | Freq: Every day | ORAL | Status: DC
  Administered 2023-07-24 – 2023-07-25 (×2): 25 mg via ORAL
  Filled 2023-07-24 (×2): qty 1

## 2023-07-24 MED ORDER — ACETAMINOPHEN 650 MG RE SUPP: 650.0000 mg | RECTAL | Status: DC | PRN

## 2023-07-24 MED ORDER — ACETAMINOPHEN 325 MG PO TABS
650.0000 mg | ORAL_TABLET | ORAL | Status: DC | PRN
  Administered 2023-07-24: 650 mg via ORAL
  Filled 2023-07-24: qty 2

## 2023-07-24 MED ORDER — SODIUM CHLORIDE 0.9% FLUSH: 10.0000 mL | INTRAVENOUS | Status: DC | PRN

## 2023-07-24 MED ORDER — STROKE: EARLY STAGES OF RECOVERY BOOK: Freq: Once | Status: AC

## 2023-07-24 MED ORDER — INSULIN ASPART 100 UNIT/ML IJ SOLN: 0.0000 [IU] | Freq: Every day | INTRAMUSCULAR | Status: DC

## 2023-07-24 MED ORDER — ACETAMINOPHEN 160 MG/5ML PO SOLN: 650.0000 mg | ORAL | Status: DC | PRN

## 2023-07-24 MED ORDER — SENNOSIDES-DOCUSATE SODIUM 8.6-50 MG PO TABS: 1.0000 | ORAL_TABLET | Freq: Every evening | ORAL | Status: DC | PRN

## 2023-07-24 MED ORDER — PANTOPRAZOLE SODIUM 40 MG PO TBEC
40.0000 mg | DELAYED_RELEASE_TABLET | Freq: Every day | ORAL | Status: DC
  Administered 2023-07-24 – 2023-07-25 (×2): 40 mg via ORAL
  Filled 2023-07-24 (×2): qty 1

## 2023-07-24 MED ORDER — CHLORHEXIDINE GLUCONATE CLOTH 2 % EX PADS
6.0000 | MEDICATED_PAD | Freq: Every day | CUTANEOUS | Status: DC
  Administered 2023-07-24 – 2023-07-25 (×2): 6 via TOPICAL

## 2023-07-24 MED ORDER — LORAZEPAM 0.5 MG PO TABS
0.5000 mg | ORAL_TABLET | Freq: Three times a day (TID) | ORAL | Status: DC | PRN
  Administered 2023-07-24 – 2023-07-25 (×3): 0.5 mg via ORAL
  Filled 2023-07-24 (×3): qty 1

## 2023-07-24 MED ORDER — INSULIN ASPART 100 UNIT/ML IJ SOLN: 0.0000 [IU] | Freq: Three times a day (TID) | INTRAMUSCULAR | Status: DC

## 2023-07-24 MED ORDER — SODIUM CHLORIDE 0.9 % IV SOLN: INTRAVENOUS | Status: AC

## 2023-07-24 MED ORDER — LEVOTHYROXINE SODIUM 25 MCG PO TABS
25.0000 ug | ORAL_TABLET | Freq: Every day | ORAL | Status: DC
  Administered 2023-07-24 – 2023-07-25 (×2): 25 ug via ORAL
  Filled 2023-07-24 (×2): qty 1

## 2023-07-24 MED ORDER — MONTELUKAST SODIUM 10 MG PO TABS
10.0000 mg | ORAL_TABLET | Freq: Every day | ORAL | Status: DC
  Administered 2023-07-24: 10 mg via ORAL
  Filled 2023-07-24: qty 1

## 2023-07-24 MED ORDER — CYCLOSPORINE 0.05 % OP EMUL
1.0000 [drp] | Freq: Two times a day (BID) | OPHTHALMIC | Status: DC
  Administered 2023-07-24 (×2): 1 [drp] via OPHTHALMIC
  Filled 2023-07-24 (×2): qty 30

## 2023-07-24 MED ORDER — PROMETHAZINE-DM 6.25-15 MG/5ML PO SYRP: 5.0000 mL | ORAL_SOLUTION | Freq: Four times a day (QID) | ORAL | Status: DC | PRN

## 2023-07-24 MED ORDER — PROMETHAZINE HCL 6.25 MG/5ML PO SOLN: 6.2500 mg | ORAL | Status: DC | PRN

## 2023-07-24 MED ORDER — SODIUM CHLORIDE 0.9% FLUSH
10.0000 mL | Freq: Two times a day (BID) | INTRAVENOUS | Status: DC
  Administered 2023-07-24 – 2023-07-25 (×2): 10 mL

## 2023-07-24 MED ORDER — RIVAROXABAN 20 MG PO TABS
20.0000 mg | ORAL_TABLET | Freq: Every day | ORAL | Status: DC
  Administered 2023-07-24 – 2023-07-25 (×2): 20 mg via ORAL
  Filled 2023-07-24 (×2): qty 1

## 2023-07-24 MED ORDER — IRBESARTAN 150 MG PO TABS
150.0000 mg | ORAL_TABLET | Freq: Every day | ORAL | Status: DC
  Administered 2023-07-24 – 2023-07-25 (×2): 150 mg via ORAL
  Filled 2023-07-24 (×2): qty 1

## 2023-07-24 MED ORDER — DILTIAZEM HCL ER COATED BEADS 240 MG PO CP24
360.0000 mg | ORAL_CAPSULE | Freq: Every day | ORAL | Status: DC
  Administered 2023-07-24 – 2023-07-25 (×2): 360 mg via ORAL
  Filled 2023-07-24 (×2): qty 1

## 2023-07-24 MED ORDER — DEXAMETHASONE 4 MG PO TABS
4.0000 mg | ORAL_TABLET | Freq: Two times a day (BID) | ORAL | Status: DC
  Administered 2023-07-24 – 2023-07-25 (×3): 4 mg via ORAL
  Filled 2023-07-24 (×3): qty 1

## 2023-07-24 NOTE — Progress Notes (Signed)
 OT Cancellation Note  Patient Details Name: Becky [name] MRN: [id number] DOB: [birth year]   Cancelled Treatment:    Reason Eval/Treat Not Completed: OT screened, no needs identified, will sign off. Pt is not in need of OT services based on discussion with evaluating PT. Pt will be removed from the OT list. Thank you.   Artyom Stencel OT, MOT   Jayson Person 07/24/2023, 12:18 PM

## 2023-07-24 NOTE — Telephone Encounter (Signed)
 Scheduled appointment with the patient per staff message.

## 2023-07-24 NOTE — Progress Notes (Signed)
  Echocardiogram 2D Echocardiogram has been performed.  Devora Ellouise SAUNDERS 07/24/2023, 11:25 AM
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
## 2023-07-24 NOTE — Evaluation (Signed)
 Physical Therapy Evaluation Patient Details Name: Becky [name] MRN: [id number] DOB: 09-07-1960 Today's Date: 07/24/2023  History of Present Illness  Becky [name] is a 63 y.o. female with medical history significant for hypertension, hyperlipidemia, atrial fibrillation on Xarelto , non-small cell lung cancer with metastases, hypothyroidism, OSA on CPAP, and recent ischemic CVA who presents after an episode of left arm numbness.     Patient states that she had undergone radiation therapy earlier in the day and was shopping at [employer] when she developed acute onset of left arm numbness.  This resolved after approximately 3 or 4 minutes.  There was no focal weakness, change in vision, or other symptoms associated with this.  She denies any chest pain or dyspnea.     She had been admitted to the hospital earlier this month with acute CVA that appeared embolic and occurred in the setting of being off of Xarelto  for port placement.   Clinical Impression  Patient agreeable to and tolerate PT evaluation well. On this date, patient is at/near baseline, requiring supervision/mod independent with all bed mobility, functional transfers, and ambulation without AD. Patient sensation WNL, reporting LUE sensation changes are coming and going. Finger to nose coordination WNL.  No evident difference between R/L UE or LE strength but pt demo general weakness and is limited most in session due to fatigue. Mild SOB/dyspnea during. Patient does not present with urgent need for skilled acute physical therapy at this time but may benefit from PT in the recommended venue, possibly progressing to Outpatient PT, to address strength, endurance, and balance deficits.        If plan is discharge home, recommend the following: A little help with walking and/or transfers;A little help with bathing/dressing/bathroom;Assist for transportation   Can travel by private vehicle        Equipment Recommendations None  recommended by PT  Recommendations for Other Services       Functional Status Assessment Patient has had a recent decline in their functional status and demonstrates the ability to make significant improvements in function in a reasonable and predictable amount of time.     Precautions / Restrictions Precautions Precautions: None Recall of Precautions/Restrictions: Intact Restrictions Weight Bearing Restrictions Per Provider Order: No      Mobility  Bed Mobility Overal bed mobility: Needs Assistance Bed Mobility: Supine to Sit, Sit to Supine     Supine to sit: Modified independent (Device/Increase time) Sit to supine: Modified independent (Device/Increase time)   General bed mobility comments: HOB flat, inc time needed due to fatigue    Transfers Overall transfer level: Needs assistance Equipment used: None Transfers: Sit to/from Stand Sit to Stand: Modified independent (Device/Increase time)           General transfer comment: CGA initially for safety. prog to Mod Independ, slow labored movement due to fatigue    Ambulation/Gait Ambulation/Gait assistance: Supervision, Modified independent (Device/Increase time) Gait Distance (Feet): 75 Feet Assistive device: None Gait Pattern/deviations: Decreased step length - left, Decreased step length - right, Decreased stride length, Decreased weight shift to left Gait velocity: Dec     General Gait Details: Slow labored movement, dec stance on L knee due to chronic pain, limited most due to fatigue  Stairs            Wheelchair Mobility     Tilt Bed    Modified Rankin (Stroke Patients Only)       Balance Overall balance assessment: Mild deficits observed, not formally tested  Pertinent Vitals/Pain Pain Assessment Pain Assessment: No/denies pain    Home Living Family/patient expects to be discharged to:: Private residence Living Arrangements: Children;Spouse/significant other Available Help  at Discharge: Family;Available 24 hours/day Type of Home: Mobile home Home Access: Stairs to enter Entrance Stairs-Rails: Can reach both;Right;Left Entrance Stairs-Number of Steps: 5   Home Layout: One level Home Equipment: Cane - single point;Rollator (4 wheels);BSC/3in1;Shower seat      Prior Function Prior Level of Function : Independent/Modified Independent   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 ADLs Comments: Independent     Extremity/Trunk Assessment   Upper Extremity Assessment Upper Extremity Assessment: Overall WFL for tasks assessed;Generalized weakness (UE sensation WNL, finger to nose WNL, shoulder flexion MMT 4/5, shoulder ROM WFL, all bilaterally)    Lower Extremity Assessment Lower Extremity Assessment: Overall WFL for tasks assessed;Generalized weakness (LE sensation WNL, ankle DF and hip flex MMT 4/5, LE strength WFL for functional activity, all bilaterally)    Cervical / Trunk Assessment Cervical / Trunk Assessment: Normal  Communication   Communication Communication: No apparent difficulties    Cognition Arousal: Alert Behavior During Therapy: WFL for tasks assessed/performed   PT - Cognitive impairments: No apparent impairments       Following commands: Intact       Cueing Cueing Techniques: Verbal cues     General Comments      Exercises     Assessment/Plan    PT Assessment All further PT needs can be met in the next venue of care  PT Problem List Decreased strength;Decreased range of motion;Decreased activity tolerance;Decreased balance       PT Treatment Interventions      PT Goals (Current goals can be found in the Care Plan section)  Acute Rehab PT Goals Patient Stated Goal: return home PT Goal Formulation: With patient/family Time For Goal Achievement: 07/27/23 Potential to Achieve Goals: Good    Frequency       Co-evaluation               AM-PAC PT 6 Clicks Mobility  Outcome Measure Help  needed turning from your back to your side while in a flat bed without using bedrails?: None Help needed moving from lying on your back to sitting on the side of a flat bed without using bedrails?: None Help needed moving to and from a bed to a chair (including a wheelchair)?: None Help needed standing up from a chair using your arms (e.g., wheelchair or bedside chair)?: None Help needed to walk in hospital room?: A Little Help needed climbing 3-5 steps with a railing? : A Little 6 Click Score: 22    End of Session   Activity Tolerance: Patient tolerated treatment well;Patient limited by fatigue Patient left: in bed;with family/visitor present;with call bell/phone within reach   PT Visit Diagnosis: Other abnormalities of gait and mobility (R26.89);Muscle weakness (generalized) (M62.81);Other symptoms and signs involving the nervous system (R29.898)    Time: 9088-9077 PT Time Calculation (min) (ACUTE ONLY): 11 min   Charges:   PT Evaluation $PT Eval Low Complexity: 1 Low PT Treatments $Therapeutic Activity: 8-22 mins PT General Charges $$ ACUTE PT VISIT: 1 Visit        12:07 PM, 07/24/23 Shaunda Tipping Powell-Butler, PT, DPT [place] with [facility]

## 2023-07-24 NOTE — Progress Notes (Signed)
 Patient seen and examined; admitted after midnight secondary to left upper extremity weakness/numbness and concerns for TIA.  Patient with recent stroke at the beginning of the month and underlying risk factors including atrial fibrillation and chronic use of Xarelto .  CT scan of the head demonstrated no acute intracranial normalities; case discussed with neurology service with recommendation for MRI and repeat echo.  Continue Xarelto  for secondary prevention at the moment.  Complete workup as recommended.  Please refer to H&P written by Dr. Charlton on 07/24/2023 for further info/details on admission.  Eric Nunnery MD [phone]
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
## 2023-07-24 NOTE — Plan of Care (Signed)
   Problem: Education: Goal: Knowledge of General Education information will improve Description: Including pain rating scale, medication(s)/side effects and non-pharmacologic comfort measures Outcome: Progressing   Problem: Activity: Goal: Risk for activity intolerance will decrease Outcome: Progressing   Problem: Nutrition: Goal: Adequate nutrition will be maintained Outcome: Progressing   Problem: Coping: Goal: Level of anxiety will decrease Outcome: Progressing

## 2023-07-24 NOTE — Consult Note (Signed)
 [facility] Consult Services  Stat Consult  Patient Name:   Becky [name], Becky [name] Date of Birth:   1960-05-28 Identification Number:   MRN - [id number] Date of Service:   07/23/2023 23:53:48  Diagnosis:       I69.398 - Other sequelae of cerebral infarction  Impression Transient left arm numbness and tingling: Recrudescence v TIA Admit patient for further workup and observation. BC, cMP, UA for mimic No need to repeat LDL , A1c if completed < 3 month Order MRI brain. Obtain echocardiogram to evaluate for intracardiac thrombus. Continue current medications, including Xarelto  20 mg daily for atrial fibrillation. Monitor vital signs, particularly blood pressure and heart rate. Perform neurological checks. Discuss risks, benefits, and alternatives of admission with patient; patient agreed to admission.  Atrial fibrillation: Continue Xarelto  20 mg daily for anticoagulation. Monitor heart rate and rhythm. Evaluate effectiveness of current anticoagulation therapy in preventing thromboembolic events.  History of stroke: Review recent MRI findings. Compare new imaging studies (CT head, potential MRI) to previous studies to evaluate for any new changes. Optimize secondary stroke prevention measures.  Metastatic cancer: Evaluate for any cancer-related complications or progression that may be contributing to current symptoms. Consult with oncology team regarding current treatment plan and potential interactions with neurological symptoms.   Recommendations: Our recommendations are outlined below.  Laboratory Studies : Lipid panel I ordered Hemoglobin A1c  Nursing Recommendations : IV Fluids, avoid dextrose  containing fluids, Maintain euglycemia Neuro checks q4 hrs x 24 hrs and then per shift Head of bed 30 degrees Continue with Telemetry  Consultations : Recommend Speech therapy if failed dysphagia screen Physical therapy/Occupational  therapy    ----------------------------------------------------------------------------------------------------    Metrics: Dispatch Time: 07/23/2023 23:52:41 Callback Response Time: 07/23/2023 23:56:09  Primary Provider Notified of Diagnostic Impression and Management Plan on: 07/24/2023 01:21:46    Imaging IMPRESSION: 1. No of emergent large vessel occlusion or proximal hemodynamically significant stenosis. 2. Numerous metastases are not well evaluated by CT and were characterized on recent MRI. No progressive mass effect or superimposed acute abnormality identified on CT head 3. Right upper lobe mass, which measures mildly increased in size compared to May 2025 CT chest (now 3.5 x 2.5 cm and previously 3.5 x 2.2 cm).    ----------------------------------------------------------------------------------------------------  Chief Complaint: numbness  History of Present Illness: Patient is a 63 year old Female. Becky [name], a 63 year old woman, presents with recurrent left arm numbness and tingling that started today and has since resolved. The patient reports these symptoms are similar to those experienced during her previous stroke. She denies any numbness or tingling in her lower extremities.  The patient has a significant medical history including stroke, atrial fibrillation, coronary artery disease, hyperlipidemia, hypertension, hypothyroidism, diabetes, and metastatic cancer. She is currently undergoing radiation and chemo therapy for her metastatic cancer. Becky [name] reports a recent hospital visit on July 6th at [facility] where an MRI was performed, though the reason for this visit is not clearly stated.  As of note, the patient is currently taking Xarelto  for atrial fibrillation and has a history of being on heparin  in the past. Her current vital signs show a blood pressure of 124/72 and a pulse ranging from 90 to 120, consistent with her known atrial  fibrillation. Her EKG shows intermittent P waves and occasional skipped beats.   Past Medical History:      Hypertension      Diabetes Mellitus      Hyperlipidemia      Atrial Fibrillation  Stroke  Medications:  Anticoagulant use:  Yes xarelto  No Antiplatelet use Reviewed EMR for current medications  Allergies:  Reviewed  Social History: Smoking: Yes  Family History:  There is no family history of premature cerebrovascular disease pertinent to this consultation  ROS : 14 Points Review of Systems was performed and was negative except mentioned in HPI.  Past Surgical History: There Is No Surgical History Contributory To Today's Visit   Examination: BP(124/72), Pulse(113), 1A: Level of Consciousness - Alert; keenly responsive + 0 1B: Ask Month and Age - Both Questions Right + 0 1C: Blink Eyes & Squeeze Hands - Performs Both Tasks + 0 2: Test Horizontal Extraocular Movements - Normal + 0 3: Test Visual Fields - No Visual Loss + 0 4: Test Facial Palsy (Use Grimace if Obtunded) - Normal symmetry + 0 5A: Test Left Arm Motor Drift - No Drift for 10 Seconds + 0 5B: Test Right Arm Motor Drift - No Drift for 10 Seconds + 0 6A: Test Left Leg Motor Drift - No Drift for 5 Seconds + 0 6B: Test Right Leg Motor Drift - No Drift for 5 Seconds + 0 7: Test Limb Ataxia (FNF/Heel-Shin) - No Ataxia + 0 8: Test Sensation - Normal; No sensory loss + 0 9: Test Language/Aphasia - Normal; No aphasia + 0 10: Test Dysarthria - Normal + 0 11: Test Extinction/Inattention - No abnormality + 0  NIHSS Score: 0  Spoke with : Dr. Geroldine    This consult was conducted in real time using interactive audio and Immunologist. Patient was informed of the technology being used for this visit and agreed to proceed. Patient located in hospital and provider located at home/office setting.  Patient is being evaluated for possible acute neurologic impairment and high probability of imminent or life -  threatening deterioration.I spent total of 35 minutes providing care to this patient, including time for face to face visit via telemedicine, review of medical records, imaging studies and discussion of findings with providers, the patient and / or family.   Dr Kemiah Booz   [facility] For Inpatient follow-up with [facility] physician please call [facility] at [phone]. As we are not an outpatient service for any post hospital discharge needs please contact the hospital for assistance.  If you have any questions for the [facility] physicians or need to reconsult for clinical or diagnostic changes please contact us  via [facility] at [phone].

## 2023-07-24 NOTE — TOC Initial Note (Addendum)
 Transition of Care [facility]) - Initial/Assessment Note    Patient Details  Name: Becky [name] MRN: [id number] Date of Birth: September 10, 1960  [facility] CM/SW Contact:    Sharlyne Stabs, RN Phone Number: 07/24/2023, 11:56 AM  Clinical Narrative:       Patient admitted with transient neurologic deficit. Last admission TOC set up patient with Out Patient PT. Patient stated it was very hard to make the appointments alone with her radiation appointments. She  will start Chemo at [facility] next week.  She can drive, he family does not like her to drive. She has a walker to use as needed. TOC following.       PT said Home health. CM explained to patient that we have no accepting agencies. She will decide if she can go to Outpatient. Orders placed, Out Patient PT will call to schedule.       Expected Discharge Plan: Home/Self Care Barriers to Discharge: Continued Medical Work up   Patient Goals and CMS Choice Patient states their goals for this hospitalization and ongoing recovery are:: Return home CMS Medicare.gov Compare Post Acute Care list provided to:: Patient        Expected Discharge Plan and Services       Living arrangements for the past 2 months: Single Family Home                    Prior Living Arrangements/Services Living arrangements for the past 2 months: Single Family Home Lives with:: Adult Children Patient language and need for interpreter reviewed:: Yes Do you feel safe going back to the place where you live?: Yes      Need for Family Participation in Patient Care: Yes (Comment) Care giver support system in place?: Yes (comment) Current home services: DME Criminal Activity/Legal Involvement Pertinent to Current Situation/Hospitalization: No - Comment as needed  Activities of Daily Living   ADL Screening (condition at time of admission) Independently performs ADLs?: Yes (appropriate for developmental age) Is the patient deaf or have difficulty hearing?:  No Does the patient have difficulty seeing, even when wearing glasses/contacts?: No Does the patient have difficulty concentrating, remembering, or making decisions?: No  Permission Sought/Granted                  Emotional Assessment     Affect (typically observed): Accepting Orientation: : Oriented to Self, Oriented to Place, Oriented to  Time, Oriented to Situation Alcohol / Substance Use: Not Applicable Psych Involvement: No (comment)  Admission diagnosis:  Transient neurologic deficit [R29.818] Atrial fibrillation with RVR (HCC) [I48.91] Paresthesia of arm [R20.2] Patient Active Problem List   Diagnosis Date Noted   Transient neurologic deficit 07/24/2023   Atrial fibrillation with RVR (HCC) 07/24/2023   Elevated troponin 07/24/2023   History of recent stroke 07/24/2023   Port-A-Cath in place 07/20/2023   Goals of care, counseling/discussion 07/20/2023   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) 07/16/2023   Anxiety 07/15/2023   Acute CVA (cerebrovascular accident) (HCC) 07/12/2023   Obesity, class 2 07/12/2023   Folate deficiency 06/18/2023   Hemoptysis 06/04/2023   Non-small cell carcinoma of lung (HCC) 06/04/2023   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) 06/04/2023   Metastasis to bone (HCC) 06/04/2023   Lung mass 05/27/2023   Hematochezia 04/09/2023   Stroke-like symptoms 04/08/2023   Stroke (HCC) 04/07/2023   Weakness of right quadriceps muscle 06/05/2022   ASCUS of cervix with negative high risk HPV 04/14/2022   Encounter  for screening fecal occult blood testing 04/07/2022   LGSIL on Pap smear of cervix 04/07/2022   Encounter for gynecological examination with Papanicolaou smear of cervix 04/07/2022   S/P total knee arthroplasty, right 01/29/2022   Pain from implanted hardware 11/08/2021   Painful orthopaedic hardware (HCC) 09/19/2021   Prolapsed internal hemorrhoids, grade 3 05/02/2021   LGSIL of cervix of undetermined significance  03/04/2021   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   Vulvar itching 12/07/2020   Prolapsed internal hemorrhoids, grade 2 10/11/2020   Trochanteric bursitis, right hip 10/04/2020   Trochanteric bursitis, left hip 08/02/2020   Unilateral primary osteoarthritis, left knee 08/02/2020   Hemorrhoids 08/01/2020   GI bleeding 07/18/2020   Anemia    Shortness of breath    Rectal bleeding 09/17/2018   Vaginal odor 09/24/2017   Abdominal pain 08/18/2017   Nausea without vomiting 11/17/2016   Current use of long term anticoagulation 11/12/2016   Atrial fibrillation, chronic (HCC) 11/07/2016   Coronary artery disease due to lipid rich plaque    RLQ abdominal pain 10/03/2016   Yeast infection 03/13/2016   Fibroids 03/13/2016   Screening for colorectal cancer 11/28/2015   Burning with urination 11/28/2015   Subacute frontal sinusitis 11/28/2015   Leg cramps 10/31/2015   Chest pain 10/31/2015   Varicose 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 07/23/2015   Body aches 11/21/2014   Constipation 11/21/2014   Vaginal itching 03/24/2014   Vaginal discharge 03/24/2014   Pelvic pain 11/02/2013   Elevated cholesterol 11/02/2013   Low back pain 10/20/2013   Stiffness of ankle joint 10/20/2013   Pain in joint, ankle and foot 10/20/2013   Vaginal irritation 05/23/2013   Hematuria 05/23/2013   BV (bacterial vaginosis) 05/23/2013   HEMATOCHEZIA 12/05/2009   Dysphagia 12/05/2009   CHEST WALL PAIN, ANTERIOR 06/23/2007   LEG PAIN 05/18/2007   VAGINAL PRURITUS 04/16/2007   GOITER 03/10/2007   Hypothyroidism 03/10/2007   Type 2 diabetes mellitus with hyperlipidemia (HCC) 03/10/2007   HYPERLIPIDEMIA 03/10/2007   ANEMIA-IRON DEFICIENCY 03/10/2007   Essential hypertension 03/10/2007   RHINITIS, CHRONIC 03/10/2007   ASTHMA, CHILDHOOD 03/10/2007   Gastroesophageal reflux disease 03/10/2007   PEPTIC ULCER DISEASE 03/10/2007   MENOPAUSAL SYNDROME 03/10/2007   PCP:  [name],  MD Pharmacy:   [facility] [address], [place] - [address] [place] [address] Phone: [phone] Fax: [phone]  [id number] [place],  - [address] [place] [address] Phone: [phone] Fax: [phone]     Social Drivers of Health (SDOH) Social History: SDOH Screenings   Food Insecurity: No Food Insecurity (07/24/2023)  Housing: Low Risk  (07/24/2023)  Transportation Needs: No Transportation Needs (07/24/2023)  Utilities: Not At Risk (07/24/2023)  Alcohol Screen: Low Risk  (05/28/2022)  Depression (PHQ2-9): Low Risk  (06/30/2023)  Financial Resource Strain: Low Risk  (03/24/2023)  Physical Activity: Sufficiently Active (08/25/2022)  Social Connections: Moderately Integrated (04/07/2023)  Stress: No Stress Concern Present (05/28/2022)  Tobacco Use: Medium Risk (07/24/2023)  Health Literacy: Adequate Health Literacy (10/16/2022)   SDOH Interventions:    Readmission Risk Interventions    04/08/2023   11:09 AM  Readmission Risk Prevention Plan  Transportation Screening Complete  Home Care Screening Complete  Medication Review (RN CM) Complete

## 2023-07-24 NOTE — Telephone Encounter (Signed)
  I received a phone call after office hours through the cell phone requesting to call back Becky [name] her CPAP machine is not working and she cannot wait for office hours  quote of texted message.  The patient is followed by Dr. Buck, she insisted on a new CPAP machine to be provided for her immediately.  I explained to her that I cannot order a CPAP urgently for her and that her neurologist will call her during regular office hours.   Dedra Gores, MD

## 2023-07-25 ENCOUNTER — Observation Stay (HOSPITAL_COMMUNITY)

## 2023-07-25 DIAGNOSIS — R29818 Other symptoms and signs involving the nervous system: Secondary | ICD-10-CM | POA: Diagnosis not present

## 2023-07-25 DIAGNOSIS — I4891 Unspecified atrial fibrillation: Secondary | ICD-10-CM | POA: Diagnosis not present

## 2023-07-25 DIAGNOSIS — C3490 Malignant neoplasm of unspecified part of unspecified bronchus or lung: Secondary | ICD-10-CM | POA: Diagnosis not present

## 2023-07-25 DIAGNOSIS — E1169 Type 2 diabetes mellitus with other specified complication: Secondary | ICD-10-CM | POA: Diagnosis not present

## 2023-07-25 LAB — CBC
HCT: 33.4 % — ABNORMAL LOW (ref 36.0–46.0)
Hemoglobin: 11.4 g/dL — ABNORMAL LOW (ref 12.0–15.0)
MCH: 30.7 pg (ref 26.0–34.0)
MCHC: 34.1 g/dL (ref 30.0–36.0)
MCV: 90 fL (ref 80.0–100.0)
Platelets: 159 K/uL (ref 150–400)
RBC: 3.71 MIL/uL — ABNORMAL LOW (ref 3.87–5.11)
RDW: 12.5 % (ref 11.5–15.5)
WBC: 6.7 K/uL (ref 4.0–10.5)
nRBC: 0 % (ref 0.0–0.2)

## 2023-07-25 LAB — BASIC METABOLIC PANEL WITH GFR
Anion gap: 9 (ref 5–15)
BUN: 18 mg/dL (ref 8–23)
CO2: 21 mmol/L — ABNORMAL LOW (ref 22–32)
Calcium: 9.5 mg/dL (ref 8.9–10.3)
Chloride: 107 mmol/L (ref 98–111)
Creatinine, Ser: 1.02 mg/dL — ABNORMAL HIGH (ref 0.44–1.00)
GFR, Estimated: >60 mL/min (ref >60)
Glucose, Bld: 181 mg/dL — ABNORMAL HIGH (ref 70–99)
Potassium: 3.9 mmol/L (ref 3.5–5.1)
Sodium: 137 mmol/L (ref 135–145)

## 2023-07-25 MED ORDER — ONDANSETRON 8 MG PO TBDP: 8.0000 mg | ORAL_TABLET | Freq: Four times a day (QID) | ORAL | 1 refills | Status: DC | PRN

## 2023-07-25 MED ORDER — GADOBUTROL 1 MMOL/ML IV SOLN
10.0000 mL | Freq: Once | INTRAVENOUS | Status: AC | PRN
  Administered 2023-07-25: 10 mL via INTRAVENOUS

## 2023-07-25 MED ORDER — ASPIRIN 81 MG PO TBEC: 81.0000 mg | DELAYED_RELEASE_TABLET | Freq: Every day | ORAL | 12 refills | Status: AC

## 2023-07-25 MED ORDER — HEPARIN SOD (PORK) LOCK FLUSH 100 UNIT/ML IV SOLN
500.0000 [IU] | Freq: Once | INTRAVENOUS | Status: AC
  Administered 2023-07-25: 500 [IU] via INTRAVENOUS
  Filled 2023-07-25: qty 5

## 2023-07-25 NOTE — Discharge Summary (Signed)
 Physician Discharge Summary   [name] MRN: [id number] DOB: 02/28/1960  Admit date:     07/23/2023  Discharge date: 07/25/23  Discharge Physician: Eric Nunnery   PCP: [name], MD   Recommendations at discharge:  Repeat CBC to follow hemoglobin trend/stability Repeat basic metabolic panel to follow electrolytes and renal function Make sure patient follow-up with neurology service as instructed.  Discharge Diagnoses: Principal Problem:   Transient neurologic deficit Active Problems:   Type 2 diabetes mellitus with hyperlipidemia (HCC)   Primary malignant neoplasm of lung with metastasis to brain [facility])   Atrial fibrillation with RVR (HCC)   Elevated troponin   History of recent stroke  Brief Hospital admission narrative: As per H&P written by Dr. Charlton on 07/24/2023 Becky [name] is a 63 y.o. female with medical history significant for hypertension, hyperlipidemia, atrial fibrillation on Xarelto , non-small cell lung cancer with metastases, hypothyroidism, OSA on CPAP, and recent ischemic CVA who presents after an episode of left arm numbness.   Patient states that she had undergone radiation therapy earlier in the day and was shopping at [facility] when she developed acute onset of left arm numbness.  This resolved after approximately 3 or 4 minutes.  There was no focal weakness, change in vision, or other symptoms associated with this.  She denies any chest pain or dyspnea.   She had been admitted to the hospital earlier this month with acute CVA that appeared embolic and occurred in the setting of being off of Xarelto  for port placement.   ED Course: Upon arrival to the ED, patient is found to be afebrile and saturating well on room air with elevated heart rate and stable blood pressure.  Labs are most notable for normal creatinine, normal WBC, normal hemoglobin, normal platelets, and troponin 98 which decreased to 87 when repeated.  Head CT is negative for  acute intracranial abnormality and there is no emergent large vessel occlusion or hemodynamically significant stenosis on CTA of the head and neck.   Patient was evaluated by teleneurology in the emergency department, and she was treated with IV diltiazem , oral metoprolol , Reglan , Benadryl , and IV fluids.  She passed the swallow screen in the ED.  Assessment and Plan: 1-TIA/left upper extremity numbness -MRI demonstrated small ischemic changes mainly affecting cerebellum - Patient with recent stroke workup at the beginning of the month and findings suggestive of atrial fibrillation - Patient has been discharged on Xarelto  and reports good compliance - 2D echo with bubble study demonstrating preserved ejection fraction, no wall motion abnormalities, no valvular disorder and no thrombi - Patient's MRI with extensive metastatic lesions due to non-small cell lung cancer - Case discussed with neurology service who recommended continued use of Xarelto , daily baby aspirin  and risk factor modifications - Continue statin.  2-A-fib with RVR - Rate remain controlled and is stable - Continue diltiazem , Xarelto  and metoprolol . - Continue patient follow-up with cardiology service.  3-non-small cell lung cancer with metastasis - Continue patient follow-up with oncology and radiation oncology service - Actively receiving radiation therapy at the moment. - Continue treatment with Decadron   4-elevated troponin - No chest pain - Most likely demand ischemia - Continue treatment with already prescribed cardiac heart regimen and continue outpatient follow-up with cardiology service.  5-hypertension - Continue treatment with current antihypertensive agents - Heart healthy/low-sodium diet discussed with patient.  6-hyperlipidemia - Continue statin.  7-obstructive sleep apnea - Continue treatment with CPAP nightly  8-class II obesity -Body mass index is 38.04 kg/m.  -  Low-calorie diet and portion  control discussed with patient.  9-GERD - Continue PPI.  Consultants: Teleneurology service consulted at time of admission. Procedures performed: See below for x-ray reports. Disposition: Home Diet recommendation: Heart healthy/low-sodium diet.  DISCHARGE MEDICATION: Allergies as of 07/25/2023       Reactions   Apresoline  [hydralazine ] Nausea Only   Latex Other (See Comments)   Unknown    Other Other (See Comments)   Band aids discolor skin EKG pads irritate skin    Prednisone  Swelling   Patient states that she can take methylprednisolone  without complications        Medication List     STOP taking these medications    ondansetron  8 MG tablet Commonly known as: Zofran        TAKE these medications    acetaminophen  500 MG tablet Commonly known as: TYLENOL  Take 1,000 mg by mouth every 6 (six) hours as needed for mild pain (pain score 1-3).   albuterol  108 (90 Base) MCG/ACT inhaler Commonly known as: VENTOLIN  HFA Inhale 2 puffs into the lungs every 4 (four) hours as needed for wheezing or shortness of breath.   aspirin  EC 81 MG tablet Take 1 tablet (81 mg total) by mouth daily. Swallow whole.   azelastine  0.1 % nasal spray Commonly known as: ASTELIN  Place 2 sprays into both nostrils 2 (two) times daily. Use in each nostril as directed   dexamethasone  4 MG tablet Commonly known as: DECADRON  Take 1 tablet (4 mg total) by mouth 2 (two) times daily with a meal.   dexlansoprazole  60 MG capsule Commonly known as: DEXILANT  Take 1 capsule (60 mg total) by mouth daily.   diltiazem  360 MG 24 hr capsule Commonly known as: TIAZAC  Take 360 mg by mouth daily.   EPINEPHrine  0.3 mg/0.3 mL Soaj injection Commonly known as: EPI-PEN Inject 0.3 mg into the muscle once as needed for anaphylaxis.   folic acid  1 MG tablet Commonly known as: FOLVITE  Take 1 tablet (1 mg total) by mouth daily.   glimepiride 2 MG tablet Commonly known as: AMARYL Take 2 mg by mouth daily  with breakfast.   Klor-Con  M20 20 MEQ tablet Generic drug: potassium chloride  SA Take 20 mEq by mouth daily. Becky [name] takes as needed when she has cramps   lidocaine -prilocaine  cream Commonly known as: EMLA  Apply to affected area once What changed:  how much to take how to take this when to take this   linaclotide  72 MCG capsule Commonly known as: Linzess  Take 1 capsule (72 mcg total) by mouth daily before breakfast.   LORazepam  0.5 MG tablet Commonly known as: ATIVAN  Take 1 tablet (0.5 mg total) by mouth every 8 (eight) hours as needed for anxiety. Take 1 hour prior to radiation treatment   montelukast  10 MG tablet Commonly known as: SINGULAIR  Take 10 mg by mouth at bedtime.   NON FORMULARY Pt uses a cpap nightly   olmesartan  40 MG tablet Commonly known as: Benicar  Take 1 tablet (40 mg total) by mouth daily. What changed: how much to take   ondansetron  8 MG disintegrating tablet Commonly known as: ZOFRAN -ODT Take 1 tablet (8 mg total) by mouth every 6 (six) hours as needed for nausea or vomiting. What changed:  medication strength how much to take   polyethylene glycol 17 g packet Commonly known as: MiraLax  Take 17 g by mouth daily.   prochlorperazine  10 MG tablet Commonly known as: COMPAZINE  Take 1 tablet (10 mg total) by mouth every 6 (six) hours  as needed for nausea or vomiting.   promethazine -dextromethorphan  6.25-15 MG/5ML syrup Commonly known as: PROMETHAZINE -DM Take 5 mLs by mouth 4 (four) times daily as needed. What changed: reasons to take this   Restasis  0.05 % ophthalmic emulsion Generic drug: cycloSPORINE  Place 1 drop into both eyes 2 (two) times daily.   rivaroxaban  20 MG Tabs tablet Commonly known as: Xarelto  Take 1 tablet (20 mg total) by mouth daily.   rosuvastatin  20 MG tablet Commonly known as: CRESTOR  Take 1 tablet (20 mg total) by mouth daily.   spironolactone  25 MG tablet Commonly known as: ALDACTONE  TAKE 1 TABLET BY MOUTH EVERY  DAY   Synthroid  25 MCG tablet Generic drug: levothyroxine  Take 25 mcg by mouth daily.   traMADol  50 MG tablet Commonly known as: ULTRAM  Take 25 mg by mouth every 6 (six) hours as needed for moderate pain (pain score 4-6).        Follow-up Information     [name], MD. Schedule an appointment as soon as possible for a visit in 10 day(s).   Specialty: Internal Medicine Contact information: [address] [place] [address] [phone]                Discharge Exam: Becky [name]   07/23/23 1840  Weight: 110.2 kg   General exam: Alert, awake, oriented x 3; in no major distress reported no current neurologic complaints. Respiratory system: Good saturation on room air. Cardiovascular system: Rate controlled; no rubs, no gallops, no JVD on exam. Gastrointestinal system: Abdomen is obese, nondistended, soft and nontender. No organomegaly or masses felt. Normal bowel sounds heard. Central nervous system: o focal neurological deficits. Extremities: No cyanosis or clubbing; trace edema appreciated bilaterally. Skin: No petechiae. Psychiatry: Judgement and insight appear normal.  Light affect appreciated on exam.   Condition at discharge: Stable and improved.  The results of significant diagnostics from this hospitalization (including imaging, microbiology, ancillary and laboratory) are listed below for reference.   Imaging Studies: MR BRAIN W WO CONTRAST Result Date: 07/25/2023 CLINICAL DATA:  63 year old female with left upper extremity neurologic deficit, TIA. Numerous brain metastases suspected on recent MRI, known lung cancer. EXAM: MRI HEAD WITHOUT AND WITH CONTRAST TECHNIQUE: Multiplanar, multiecho pulse sequences of the brain and surrounding structures were obtained without and with intravenous contrast. CONTRAST:  10mL GADAVIST  GADOBUTROL  1 MMOL/ML IV SOLN COMPARISON:  CT head, CTA head and neck yesterday. Brain MRI without and with contrast  07/12/2023. FINDINGS: Brain: Dozens of punctate solid and subcentimeter rim enhancing brain metastases (greater than 19 individual lesions in the posterior fossa alone) are redemonstrated and do not appear significantly changed from the MRI 07/12/2023. The largest cerebellar lesion is on the left and 9 mm. The largest supratentorial lesion is in the left frontal operculum, 6-7 mm. Early leptomeningeal involvement in the posterior fossa is difficult to exclude. Many of these lesions are visible on DWI due to tumor hypercellularity. But there is only mild associated vasogenic edema, which is most pronounced in the cerebellum (on the left series 11, image 13). Some of the lesions demonstrate mild hemosiderin on SWI. However, there is a new since 07/12/2023 more cortically based 8-10 mm diffusion abnormality in the right parietal lobe on series 5, image 36 which seems to be hypoenhancing (series 18, image 112) with mild cortical T2 and FLAIR hyperintensity. And there is a similar small focus of new diffusion restriction in the right cerebellar tonsil (series 5, image 11) which also seems to be hypoenhancing,  with faint linear T2 and FLAIR hyperintensity. No midline shift, mass effect, ventriculomegaly, extra-axial collection or acute intracranial hemorrhage. Cervicomedullary junction and pituitary are within normal limits. Vascular: Major intracranial vascular flow voids are stable. Following contrast the major dural venous sinuses are enhancing and appear to be patent. Skull and upper cervical spine: Negative visible cervical spinal cord. There is abnormal bone marrow signal along the superior dorsal clivus including the posterior floor of the sella turcica. This is low T1 signal and enhancing (series 9, image 14) and subtle osteolysis of the bone is noted there on CTA. Otehr visualized bone marrow signal is within normal limits. Sinuses/Orbits: Stable and negative. Other: Mastoids are well aerated. Visible internal  auditory structures appear normal. IMPRESSION: 1. Dozens of subcentimeter Brain Metastases (greater than 19 in the posterior fossa alone). The largest lesions are 9 mm in the left cerebellum and 7 mm at the left frontal operculum. Many are visible on DWI, have mild hemosiderin, and are not significantly changed from the MRI on 07/12/2023. Only mild associated vasogenic edema, most pronounced in the cerebellum. 2. But Two new areas of abnormal diffusion more are suggestive of small acute ischemic infarcts than tumor: In the right parietal lobe and right central cerebellum. No associated hemorrhage or mass effect. 3. Additionally, a small lytic bone metastasis is suspected at the superior clivus and posterior floor of the sella turcica. Electronically Signed   By: VEAR Hurst M.D.   On: 07/25/2023 07:53   ECHOCARDIOGRAM LIMITED BUBBLE STUDY Result Date: 07/24/2023    ECHOCARDIOGRAM LIMITED REPORT   Patient Name:   Becky [name] Date of Exam: 07/24/2023 Medical Rec #:  [id number]         Height:       67.0 in Accession #:    [id number]        Weight:       242.9 lb Date of Birth:  1960-12-03         BSA:          2.196 m Patient Age:    63 years          BP:           123/84 mmHg Patient Gender: F                 HR:           94 bpm. Exam Location:  Inpatient Procedure: Limited Echo, Cardiac Doppler, Color Doppler and Saline Contrast            Bubble Study (Both Spectral and Color Flow Doppler were utilized            during procedure). Indications:    TIA  History:        Patient has prior history of Echocardiogram examinations, most                 recent 07/13/2023. Abnormal ECG, Stroke, Arrythmias:Atrial                 Fibrillation, Signs/Symptoms:Chest Pain; Risk Factors:Diabetes,                 Hypertension and Dyslipidemia. Lung cancer. History of negative                 bubble study on 7/7.  Sonographer:    Ellouise Mose RDCS Referring Phys: [id number] TIMOTHY S OPYD  Sonographer Comments: Technically difficult  study due to poor echo windows and patient is obese. Image acquisition challenging  due to patient body habitus. IMPRESSIONS  1. Left ventricular ejection fraction, by estimation, is 55 to 60%. The left ventricle has normal function. The left ventricle has no regional wall motion abnormalities.  2. Right ventricular systolic function is normal. The right ventricular size is normal.  3. The mitral valve is normal in structure. No evidence of mitral valve regurgitation. No evidence of mitral stenosis.  4. The aortic valve was not well visualized. Aortic valve regurgitation is moderate. No aortic stenosis is present.  5. Agitated saline contrast bubble study was negative, with no evidence of any interatrial shunt.  6. Limited echo FINDINGS  Left Ventricle: Left ventricular ejection fraction, by estimation, is 55 to 60%. The left ventricle has normal function. The left ventricle has no regional wall motion abnormalities. Right Ventricle: The right ventricular size is normal. Right vetricular wall thickness was not well visualized. Right ventricular systolic function is normal. Pericardium: There is no evidence of pericardial effusion. Mitral Valve: The mitral valve is normal in structure. No evidence of mitral valve stenosis. Aortic Valve: The aortic valve was not well visualized. Aortic valve regurgitation is moderate. No aortic stenosis is present. Pulmonic Valve: The pulmonic valve was normal in structure. Pulmonic valve regurgitation is not visualized. No evidence of pulmonic stenosis. Aorta: The aortic root and ascending aorta are structurally normal, with no evidence of dilitation. IAS/Shunts: No atrial level shunt detected by color flow Doppler. Agitated saline contrast was given intravenously to evaluate for intracardiac shunting. Agitated saline contrast bubble study was negative, with no evidence of any interatrial shunt. Additional Comments: Spectral Doppler performed. Color Doppler performed.  LEFT VENTRICLE  PLAX 2D LVIDd:         4.90 cm LVIDs:         3.30 cm LV PW:         1.10 cm LV IVS:        1.00 cm  LV Volumes (MOD) LV vol d, MOD A2C: 70.3 ml LV vol d, MOD A4C: 97.2 ml LV vol s, MOD A2C: 34.0 ml LV vol s, MOD A4C: 39.2 ml LV SV MOD A2C:     36.3 ml LV SV MOD A4C:     97.2 ml LV SV MOD BP:      47.5 ml IVC IVC diam: 2.20 cm  AORTA Ao Root diam: 2.60 cm Ao Asc diam:  3.40 cm Dorn Ross MD Electronically signed by Dorn Ross MD Signature Date/Time: 07/24/2023/11:48:15 AM    Final    CT ANGIO HEAD NECK W WO CM Result Date: 07/23/2023 CLINICAL DATA:  Transient ischemic attack (TIA) EXAM: CT ANGIOGRAPHY HEAD AND NECK WITH AND WITHOUT CONTRAST TECHNIQUE: Multidetector CT imaging of the head and neck was performed using the standard protocol during bolus administration of intravenous contrast. Multiplanar CT image reconstructions and MIPs were obtained to evaluate the vascular anatomy. Carotid stenosis measurements (when applicable) are obtained utilizing NASCET criteria, using the distal internal carotid diameter as the denominator. RADIATION DOSE REDUCTION: This exam was performed according to the departmental dose-optimization program which includes automated exposure control, adjustment of the mA and/or kV according to patient size and/or use of iterative reconstruction technique. CONTRAST:  75mL OMNIPAQUE  IOHEXOL  350 MG/ML SOLN COMPARISON:  None Available. FINDINGS: CT HEAD FINDINGS Brain: No evidence of acute infarction, hemorrhage, hydrocephalus, extra-axial collection or mass effect. Numerous metastases are not well evaluated by CT and were characterized on recent MRI. Vascular: See below. Skull: No acute fracture. Sinuses/Orbits: Clear sinuses.  No acute orbital  findings. Other: No mastoid effusions. Review of the MIP images confirms the above findings CTA NECK FINDINGS Aortic arch: Great vessel origins are patent. No significant stenosis. Right carotid system: No evidence of dissection, stenosis  (50% or greater), or occlusion. Left carotid system: No evidence of dissection, stenosis (50% or greater), or occlusion. Vertebral arteries: No evidence of dissection, stenosis (50% or greater), or occlusion. Skeleton: No acute abnormality on limited assessment. Other neck: No acute abnormality on limited assessment. Upper chest: Right upper lobe mass, which measures mildly increased in size compared to May 2025 CT chest (now 3.5 x 2.5 cm and previously 3.5 x 2.2 cm). Review of the MIP images confirms the above findings CTA HEAD FINDINGS Anterior circulation: Bilateral intracranial ICAs, MCAs and ACAs are patent without proximal high-grade stenosis. Bilateral intracranial ICA calcific atherosclerosis. No aneurysm identified. Posterior circulation: Bilateral intradural vertebral arteries, basilar artery and bilateral posterior cerebral arteries are patent without proximal 8 mm of the significant stenosis. Venous sinuses: As permitted by contrast timing, patent. Review of the MIP images confirms the above findings IMPRESSION: 1. No of emergent large vessel occlusion or proximal hemodynamically significant stenosis. 2. Numerous metastases are not well evaluated by CT and were characterized on recent MRI. No progressive mass effect or superimposed acute abnormality identified on CT head 3. Right upper lobe mass, which measures mildly increased in size compared to May 2025 CT chest (now 3.5 x 2.5 cm and previously 3.5 x 2.2 cm). Electronically Signed   By: Gilmore GORMAN Molt M.D.   On: 07/23/2023 23:37   MR CERVICAL SPINE W WO CONTRAST Result Date: 07/14/2023 EXAM: MRI CERVICAL SPINE WITH AND WITHOUT CONTRAST 07/13/2023 10:05:00 PM TECHNIQUE: Multiplanar multisequence MRI of the cervical spine was performed without and with the administration of intravenous contrast. COMPARISON: MRI of the cervical spine 06/18/2011. CLINICAL HISTORY: Metastatic disease evaluation. Intracranial metastases. FINDINGS: BONES AND ALIGNMENT:  Normal alignment. Normal vertebral body heights. Mild edematous endplate marrow changes are present at C5-6. No other marrow signal change or enhancement is present to suggest metastatic disease of the cervical spine. SPINAL CORD: Normal spinal cord size. Normal spinal cord signal. No pathologic enhancement is present to suggest metastatic disease of the spinal cord. SOFT TISSUES: No paraspinal mass. C2-C3: No significant disc herniation. No spinal canal stenosis or neural foraminal narrowing. C3-C4: Mild disc bulging without significant stenosis. C4-C5: A leftward disc osteophyte complex partially effaces the ventral CSF. Uncovertebral spurring contributes to moderate left foraminal stenosis. C5-C6: A rightward disc osteophyte complex partially effaces the ventral CSF. Uncovertebral spurring contributes to moderate right foraminal stenosis. C6-C7: No significant disc herniation. No spinal canal stenosis or neural foraminal narrowing. C7-T1: Mild disc bulging without significant stenosis. IMPRESSION: 1. No evidence of metastatic disease in the cervical spine. 2. Moderate left foraminal stenosis at C4-C5 and moderate right foraminal stenosis at C5-C6 due to uncovertebral spurring and disc osteophyte complexes. Electronically signed by: Lonni Necessary MD 07/14/2023 05:39 AM EDT RP Workstation: [id number]   CT ANGIO HEAD NECK W WO CM Result Date: 07/13/2023 CLINICAL DATA:  Stroke/TIA, determine embolic source EXAM: CT ANGIOGRAPHY HEAD AND NECK WITH AND WITHOUT CONTRAST TECHNIQUE: Multidetector CT imaging of the head and neck was performed using the standard protocol during bolus administration of intravenous contrast. Multiplanar CT image reconstructions and MIPs were obtained to evaluate the vascular anatomy. Carotid stenosis measurements (when applicable) are obtained utilizing NASCET criteria, using the distal internal carotid diameter as the denominator. RADIATION DOSE REDUCTION: This exam was performed  according  to the departmental dose-optimization program which includes automated exposure control, adjustment of the mA and/or kV according to patient size and/or use of iterative reconstruction technique. CONTRAST:  75mL OMNIPAQUE  IOHEXOL  350 MG/ML SOLN COMPARISON:  None Available. FINDINGS: CT HEAD FINDINGS Brain: No evidence of acute infarction, hemorrhage, hydrocephalus, extra-axial collection or mass lesion/mass effect. Vascular: See below. Skull: Normal. Negative for fracture or focal lesion. Sinuses/Orbits: Clear sinuses.  No acute orbital findings. Review of the MIP images confirms the above findings CTA NECK FINDINGS Aortic arch: Great vessel origins are patent. Right carotid system: No evidence of dissection, stenosis (50% or greater), or occlusion. Left carotid system: No evidence of dissection, stenosis (50% or greater), or occlusion. Vertebral arteries: Left dominant. Poor visualization of the proximal right vertebral artery due to streak artifact. No evidence of dissection, stenosis (50% or greater), or occlusion. Skeleton: No acute abnormality on limited assessment. Other neck: No acute abnormality on limited assessment. Upper chest: Increased size of a known right upper lobe pulmonary. New surrounding consolidation. Review of the MIP images confirms the above findings CTA HEAD FINDINGS Anterior circulation: Bilateral intracranial ICAs, MCAs and ACAs are patent without proximal hemodynamically significant stenosis. Posterior circulation: Bilateral intradural vertebral arteries, basilar artery and bilateral posterior cerebral arteries are patent without proximal hemodynamically significant stenosis. Venous sinuses: As permitted by contrast timing, patent. Review of the MIP images confirms the above findings IMPRESSION: 1. No emergent large vessel occlusion or proximal hemodynamically significant stenosis. 2. Increased size of a known right upper lobe pulmonary. New surrounding consolidation, concerning  for postobstructive pneumonia. Electronically Signed   By: Gilmore GORMAN Molt M.D.   On: 07/13/2023 21:21   ECHOCARDIOGRAM COMPLETE BUBBLE STUDY Result Date: 07/13/2023    ECHOCARDIOGRAM REPORT   Patient Name:   Becky [name] Date of Exam: 07/13/2023 Medical Rec #:  [id number]         Height:       67.0 in Accession #:    [id number]        Weight:       243.0 lb Date of Birth:  [birth year]         BSA:          2.197 m Patient Age:    63 years          BP:           165/78 mmHg Patient Gender: F                 HR:           82 bpm. Exam Location:  Inpatient Procedure: 2D Echo, Cardiac Doppler, Color Doppler and Saline Contrast Bubble            Study (Both Spectral and Color Flow Doppler were utilized during            procedure). Indications:    TIA  History:        Patient has prior history of Echocardiogram examinations, most                 recent 04/09/2023. Abnormal ECG, Stroke, Arrythmias:Atrial                 Fibrillation, Signs/Symptoms:Chest Pain, Shortness of Breath and                 Dyspnea; Risk Factors:Hypertension, Diabetes, Dyslipidemia and                 Sleep Apnea. Metastatic lung cancer.  Sonographer:  Ellouise Mose RDCS Referring Phys: [id number] [facility] ARRIEN  Sonographer Comments: Technically difficult study due to poor echo windows. Image acquisition challenging due to patient body habitus. IMPRESSIONS  1. Left ventricular ejection fraction, by estimation, is 55 to 60%. The left ventricle has normal function. The left ventricle has no regional wall motion abnormalities. There is mild left ventricular hypertrophy. Left ventricular diastolic parameters are indeterminate.  2. Right ventricular systolic function was not well visualized. The right ventricular size is not well visualized.  3. The mitral valve is normal in structure. No evidence of mitral valve regurgitation. No evidence of mitral stenosis.  4. Cannot rule out mobile echodensity on the non coronary cusp. The aortic valve is  tricuspid. There is moderate calcification of the aortic valve. Aortic valve regurgitation is moderate. Aortic valve sclerosis is present, with no evidence of aortic valve stenosis.  5. The inferior vena cava is dilated in size with >50% respiratory variability, suggesting right atrial pressure of 8 mmHg.  6. Agitated saline contrast bubble study was negative, with no evidence of any interatrial shunt. FINDINGS  Left Ventricle: Left ventricular ejection fraction, by estimation, is 55 to 60%. The left ventricle has normal function. The left ventricle has no regional wall motion abnormalities. The left ventricular internal cavity size was normal in size. There is  mild left ventricular hypertrophy. Left ventricular diastolic parameters are indeterminate. Right Ventricle: The right ventricular size is not well visualized. Right vetricular wall thickness was not well visualized. Right ventricular systolic function was not well visualized. Left Atrium: Left atrial size was normal in size. Right Atrium: Right atrial size was normal in size. Pericardium: Trivial pericardial effusion is present. Mitral Valve: The mitral valve is normal in structure. No evidence of mitral valve regurgitation. No evidence of mitral valve stenosis. Tricuspid Valve: The tricuspid valve is normal in structure. Tricuspid valve regurgitation is not demonstrated. No evidence of tricuspid stenosis. Aortic Valve: Cannot rule out mobile echodensity on the non coronary cusp. The aortic valve is tricuspid. There is moderate calcification of the aortic valve. Aortic valve regurgitation is moderate. Aortic valve sclerosis is present, with no evidence of aortic valve stenosis. Pulmonic Valve: The pulmonic valve was normal in structure. Pulmonic valve regurgitation is not visualized. No evidence of pulmonic stenosis. Aorta: The aortic root and ascending aorta are structurally normal, with no evidence of dilitation. Venous: The inferior vena cava is dilated  in size with greater than 50% respiratory variability, suggesting right atrial pressure of 8 mmHg. IAS/Shunts: No atrial level shunt detected by color flow Doppler. Agitated saline contrast was given intravenously to evaluate for intracardiac shunting. Agitated saline contrast bubble study was negative, with no evidence of any interatrial shunt. Additional Comments: 3D was performed not requiring image post processing on an independent workstation and was indeterminate.  LEFT VENTRICLE PLAX 2D LVIDd:         5.10 cm LVIDs:         3.60 cm LV PW:         1.10 cm LV IVS:        1.10 cm LVOT diam:     2.30 cm LV SV:         93 LV SV Index:   43 LVOT Area:     4.15 cm  LV Volumes (MOD) LV vol d, MOD A2C: 74.2 ml LV vol d, MOD A4C: 74.1 ml LV vol s, MOD A2C: 21.4 ml LV vol s, MOD A4C: 28.9 ml LV SV MOD A2C:  52.8 ml LV SV MOD A4C:     74.1 ml LV SV MOD BP:      51.9 ml IVC IVC diam: 2.20 cm LEFT ATRIUM             Index        RIGHT ATRIUM           Index LA Vol (A2C):   14.5 ml 6.60 ml/m   RA Area:     17.60 cm LA Vol (A4C):   47.8 ml 21.76 ml/m  RA Volume:   43.80 ml  19.94 ml/m LA Biplane Vol: 29.3 ml 13.34 ml/m  AORTIC VALVE LVOT Vmax:   144.00 cm/s LVOT Vmean:  84.600 cm/s LVOT VTI:    0.225 m  AORTA Ao Root diam: 3.70 cm Ao Asc diam:  3.00 cm MITRAL VALVE MV Area (PHT): 3.39 cm     SHUNTS MV Decel Time: 224 msec     Systemic VTI:  0.22 m MV E velocity: 106.60 cm/s  Systemic Diam: 2.30 cm Maude Emmer MD Electronically signed by Maude Emmer MD Signature Date/Time: 07/13/2023/3:10:44 PM    Final    MR BRAIN W WO CONTRAST Result Date: 07/12/2023 CLINICAL DATA:  Neuro deficit, concern for stroke. Pain radiating down left arm with tingling. EXAM: MRI HEAD WITHOUT AND WITH CONTRAST TECHNIQUE: Multiplanar, multiecho pulse sequences of the brain and surrounding structures were obtained without and with intravenous contrast. CONTRAST:  10mL GADAVIST  GADOBUTROL  1 MMOL/ML IV SOLN COMPARISON:  CT head 04/14/2023.   MRI head 04/07/2023. FINDINGS: Brain: There are numerous peripherally enhancing lesions throughout the cerebral hemispheres and cerebellum. Reference lesions described as follows: 9 mm left cerebellar lesion (series 18, image 53) 5 mm lesion involving the superior aspect of the right middle cerebellar peduncle (image 65). 5 mm anterior right frontal lesion (image 94). 6 mm left frontal operculum (image 96) 5 mm parasagittal left frontal lesion near the vertex (image 146) Multiple lesions demonstrate diffusion signal abnormality. There is edema surrounding the left cerebellar lesion with mild local mass effect. Additional mild focus of edema within the inferior right cerebellum without associated enhancing lesion. Numerous additional foci of diffusion signal abnormality in the cerebral hemispheres and left cerebellum without definite ADC signal abnormality which may reflect late acute versus subacute infarcts. 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 No midline shift. Small chronic microhemorrhage in the lateral left occipital lobe. Scattered T2/FLAIR hyperintensity in the supratentorial white matter. The basilar cisterns are patent. No extra-axial fluid collections. Ventricles: Normal size and configuration of the ventricles. Vascular: Skull base flow voids are visualized. Skull and upper cervical spine: No focal abnormality. Sinuses/Orbits: Orbits are symmetric. Paranasal sinuses are clear. Other: Mastoid air cells are clear. IMPRESSION: Numerous enhancing lesions in the cerebral hemispheres and cerebellum concerning for intracranial metastatic disease. Dominant lesion in the left cerebellum demonstrates surrounding enhancement and mild local mass effect. No midline shift. Scattered foci of diffusion signal abnormality throughout the cerebral hemispheres and left cerebellum some of which correspond to enhancing lesions. However the majority of these foci likely reflect acute/subacute infarct likely of embolic  etiology. Mild chronic microvascular ischemic changes. 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 Electronically Signed   By: Donnice Mania M.D.   On: 07/12/2023 21:02   DG Chest Portable 1 View Result Date: 07/12/2023 CLINICAL DATA:  Chest pain EXAM: PORTABLE CHEST 1 VIEW COMPARISON:  06/04/2023, CT 05/28/2023 FINDINGS: Left-sided central venous port tip over the SVC. Low lung volumes. Borderline cardiac enlargement.  Vague right upper lobe opacity, presumably corresponds to history of CT demonstrated lung mass. IMPRESSION: Low lung volumes. Vague right upper lobe opacity, presumably corresponds to history of CT demonstrated lung mass. Electronically Signed   By: Luke Bun M.D.   On: 07/12/2023 19:14   IR IMAGING GUIDED PORT INSERTION Result Date: 07/06/2023 CLINICAL DATA:  Right upper lobe lung carcinoma and need for porta cath for chemotherapy. EXAM: IMPLANTED PORT A CATH PLACEMENT WITH ULTRASOUND AND FLUOROSCOPIC GUIDANCE ANESTHESIA/SEDATION: Moderate (conscious) sedation was employed during this procedure. A total of Versed  2.0 mg and Fentanyl  100 mcg was administered intravenously. Moderate Sedation Time: 40 minutes. The patient's level of consciousness and vital signs were monitored continuously by radiology nursing throughout the procedure under my direct supervision. FLUOROSCOPY: Radiation Exposure Index: 7.1 mGy Kerma PROCEDURE: The procedure, risks, benefits, and alternatives were explained to the patient. Questions regarding the procedure were encouraged and answered. The patient understands and consents to the procedure. A time-out was performed prior to initiating the procedure. Ultrasound was utilized to confirm patency of the left internal jugular vein. An ultrasound image was saved and recorded. The left neck and chest were prepped with chlorhexidine  in a sterile fashion, and a sterile drape was applied covering the operative field. Maximum barrier sterile technique with  sterile gowns and gloves were used for the procedure. Local anesthesia was provided with 1% lidocaine . After creating a small venotomy incision, a 21 gauge needle was advanced into the left internal jugular vein under direct, real-time ultrasound guidance. Ultrasound image documentation was performed. After securing guidewire access, an 8 Fr dilator was placed. A J-wire was kinked to measure appropriate catheter length. A subcutaneous port pocket was then created along the upper chest wall utilizing sharp and blunt dissection. Portable cautery was utilized. The pocket was irrigated with sterile saline. A single lumen power injectable port was chosen for placement. The 8 Fr catheter was tunneled from the port pocket site to the venotomy incision. The port was placed in the pocket. External catheter was trimmed to appropriate length based on guidewire measurement. At the venotomy, an 8 Fr peel-away sheath was placed over a guidewire. The catheter was then placed through the sheath and the sheath removed. Final catheter positioning was confirmed and documented with a fluoroscopic spot image. The port was accessed with a needle and aspirated and flushed with heparinized saline. The access needle was removed. The venotomy and port pocket incisions were closed with subcutaneous 3-0 Monocryl and subcuticular 4-0 Vicryl. Dermabond was applied to both incisions. COMPLICATIONS: COMPLICATIONS None FINDINGS: After catheter placement, the tip lies at the cavo-atrial junction. The catheter aspirates normally and is ready for immediate use. IMPRESSION: Placement of single lumen port a cath via left internal jugular vein. The catheter tip lies at the cavo-atrial junction. A power injectable port a cath was placed and is ready for immediate use. Electronically Signed   By: Marcey Moan M.D.   On: 07/06/2023 15:22    Microbiology: Results for orders placed or performed during the hospital encounter of 07/12/23  Culture, group  A strep     Status: None   Collection Time: 07/12/23 12:26 PM   Specimen: Throat  Result Value Ref Range Status   Specimen Description   Final    THROAT Performed at [facility], [address] [place], [place] [address]    Special Requests   Final    NONE Performed at [facility], [address] [place], [place] [address]  Culture   Final    NO GROUP A STREP (S.PYOGENES) ISOLATED Performed at [facility], [address] [place], [place] [address]    Report Status 07/15/2023 FINAL  Final   *Note: Due to a large number of results and/or encounters for the requested time period, some results have not been displayed. A complete set of results can be found in Results Review.    Labs: CBC: Recent Labs  Lab 07/20/23 1041 07/23/23 2042 07/24/23 0505 07/25/23 0350  WBC 5.8 5.2 4.8 6.7  NEUTROABS 3.8 3.3  --   --   HGB 12.7 12.4 11.4* 11.4*  HCT 37.4 36.9 34.6* 33.4*  MCV 87.0 90.2 91.1 90.0  PLT 219 190 170 159   Basic Metabolic Panel: Recent Labs  Lab 07/20/23 1041 07/23/23 2042 07/24/23 0505 07/25/23 0350  NA 138 138 139 137  K 4.1 3.8 3.5 3.9  CL 105 104 109 107  CO2 27 23 22  21*  GLUCOSE 125* 86 141* 181*  BUN 19 16 14 18   CREATININE 1.23* 0.97 1.11* 1.02*  CALCIUM  10.3 9.9 9.4 9.5  MG  --  2.0  --   --    Liver Function Tests: Recent Labs  Lab 07/20/23 1041 07/23/23 2042  AST 15 17  ALT 12 15  ALKPHOS 79 77  BILITOT 0.6 0.8  PROT 7.0 6.9  ALBUMIN 4.3 4.0   CBG: No results for input(s): GLUCAP in the last 168 hours.  Discharge time spent:  28 minutes.  Signed: Eric Nunnery, MD [facility] 07/25/2023

## 2023-07-25 NOTE — Plan of Care (Signed)
  Problem: Safety: Goal: Ability to remain free from injury will improve Outcome: Progressing   Problem: Coping: Goal: Will verbalize positive feelings about self Outcome: Progressing   Problem: Self-Care: Goal: Ability to participate in self-care as condition permits will improve Outcome: Progressing   Problem: Nutrition: Goal: Dietary intake will improve Outcome: Progressing

## 2023-07-27 ENCOUNTER — Other Ambulatory Visit: Payer: Self-pay

## 2023-07-27 ENCOUNTER — Ambulatory Visit
Admission: RE | Admit: 2023-07-27 | Discharge: 2023-07-27 | Disposition: A | Discharge status: Home / Self Care | Source: Ambulatory Visit | Attending: Radiation Oncology | Admitting: Radiation Oncology

## 2023-07-27 ENCOUNTER — Inpatient Hospital Stay

## 2023-07-27 DIAGNOSIS — C3491 Malignant neoplasm of unspecified part of right bronchus or lung: Secondary | ICD-10-CM | POA: Diagnosis not present

## 2023-07-27 LAB — RAD ONC ARIA SESSION SUMMARY
Course Elapsed Days: 19
Plan Fractions Treated to Date: 3
Plan Fractions Treated to Date: 4
Plan Fractions Treated to Date: 8
Plan Prescribed Dose Per Fraction: 2 Gy
Plan Prescribed Dose Per Fraction: 3 Gy
Plan Prescribed Dose Per Fraction: 4 Gy
Plan Total Fractions Prescribed: 10
Plan Total Fractions Prescribed: 5
Plan Total Fractions Prescribed: 6
Plan Total Prescribed Dose: 12 Gy
Plan Total Prescribed Dose: 20 Gy
Plan Total Prescribed Dose: 30 Gy
Reference Point Dosage Given to Date: 12 Gy
Reference Point Dosage Given to Date: 22 Gy
Reference Point Dosage Given to Date: 24 Gy
Reference Point Session Dosage Given: 2 Gy
Reference Point Session Dosage Given: 3 Gy
Reference Point Session Dosage Given: 4 Gy
Session Number: 11

## 2023-07-27 NOTE — Telephone Encounter (Signed)
 Amy, you had ordered a HST and a new machine in June?

## 2023-07-28 ENCOUNTER — Other Ambulatory Visit: Payer: Self-pay

## 2023-07-28 ENCOUNTER — Ambulatory Visit
Admission: RE | Admit: 2023-07-28 | Discharge: 2023-07-28 | Disposition: A | Discharge status: Home / Self Care | Source: Ambulatory Visit | Attending: Radiation Oncology | Admitting: Radiation Oncology

## 2023-07-28 DIAGNOSIS — C3491 Malignant neoplasm of unspecified part of right bronchus or lung: Secondary | ICD-10-CM | POA: Diagnosis not present

## 2023-07-28 LAB — RAD ONC ARIA SESSION SUMMARY
Course Elapsed Days: 20
Plan Fractions Treated to Date: 4
Plan Fractions Treated to Date: 5
Plan Fractions Treated to Date: 9
Plan Prescribed Dose Per Fraction: 2 Gy
Plan Prescribed Dose Per Fraction: 3 Gy
Plan Prescribed Dose Per Fraction: 4 Gy
Plan Total Fractions Prescribed: 10
Plan Total Fractions Prescribed: 5
Plan Total Fractions Prescribed: 6
Plan Total Prescribed Dose: 12 Gy
Plan Total Prescribed Dose: 20 Gy
Plan Total Prescribed Dose: 30 Gy
Reference Point Dosage Given to Date: 16 Gy
Reference Point Dosage Given to Date: 24 Gy
Reference Point Dosage Given to Date: 27 Gy
Reference Point Session Dosage Given: 2 Gy
Reference Point Session Dosage Given: 3 Gy
Reference Point Session Dosage Given: 4 Gy
Session Number: 12

## 2023-07-28 NOTE — Telephone Encounter (Signed)
 Pt states Kimber is asking for pt's office notes and last sleep study so pt is able to get new CPAP, their # according to pt is [phone]

## 2023-07-28 NOTE — Telephone Encounter (Signed)
 Order placed for nocturnal polysomnogram due to PAP intolerance.

## 2023-07-28 NOTE — Progress Notes (Signed)
 Called in prescription for albuterol  inhaler to [facility] in [place] per Dr. Shannon   Message left on voicemail.

## 2023-07-28 NOTE — Addendum Note (Signed)
 Addended by: NEYSA NENA RAMAN on: 07/28/2023 08:17 AM   Modules accepted: Orders

## 2023-07-29 ENCOUNTER — Other Ambulatory Visit: Payer: Self-pay

## 2023-07-29 ENCOUNTER — Ambulatory Visit
Admission: RE | Admit: 2023-07-29 | Discharge: 2023-07-29 | Disposition: A | Discharge status: Home / Self Care | Source: Ambulatory Visit | Attending: Radiation Oncology | Admitting: Radiation Oncology

## 2023-07-29 ENCOUNTER — Other Ambulatory Visit: Payer: Self-pay | Admitting: Internal Medicine

## 2023-07-29 ENCOUNTER — Encounter: Payer: Self-pay | Admitting: Internal Medicine

## 2023-07-29 DIAGNOSIS — C3491 Malignant neoplasm of unspecified part of right bronchus or lung: Secondary | ICD-10-CM

## 2023-07-29 LAB — RAD ONC ARIA SESSION SUMMARY
Course Elapsed Days: 21
Plan Fractions Treated to Date: 10
Plan Fractions Treated to Date: 5
Plan Fractions Treated to Date: 6
Plan Prescribed Dose Per Fraction: 2 Gy
Plan Prescribed Dose Per Fraction: 3 Gy
Plan Prescribed Dose Per Fraction: 4 Gy
Plan Total Fractions Prescribed: 10
Plan Total Fractions Prescribed: 5
Plan Total Fractions Prescribed: 6
Plan Total Prescribed Dose: 12 Gy
Plan Total Prescribed Dose: 20 Gy
Plan Total Prescribed Dose: 30 Gy
Reference Point Dosage Given to Date: 20 Gy
Reference Point Dosage Given to Date: 26 Gy
Reference Point Dosage Given to Date: 30 Gy
Reference Point Session Dosage Given: 2 Gy
Reference Point Session Dosage Given: 3 Gy
Reference Point Session Dosage Given: 4 Gy
Session Number: 13

## 2023-07-29 NOTE — Progress Notes (Signed)
  Radiation Oncology         [phone] ________________________________  Name: Becky [name]  FMW:[id number]   DOB: Dec 02, 1960   Steroid Taper Instructions Per Dr. Lynwood Nasuti    You currently have a prescription for Dexamethasone  4 mg Tablets.   Beginning 07/29/23  Take a 4 mg table once daily x 5 days   Beginning 08/03/23 Take a 4mg  tablet every other day x 5 days  Beginning 08/08/23: Take 1/2 of a tablet (which is 2 mg) once daily x 5 days  Beginning 08/13/23: Take 1/2 of a tablet (which is 2 mg) every other day x 5 days then stop    Please call our office if you have any headaches, visual changes, uncontrolled movements, extremity weakness, nausea or vomiting.

## 2023-07-29 NOTE — Telephone Encounter (Signed)
 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  She said that can order new machine with her settings as before.  She does need HST. Check with sleep lab concerning this.  She has new insurance  [employer].  Pt did relay that she is not using -machine due to not working. (Cuts off 7-8 x).  Will touch base with Damien in Sleep lab to see about her HST order in 06-11-2023.

## 2023-07-29 NOTE — Telephone Encounter (Signed)
 I called and spoke to Becky [name].  I also was in 3/way call about Becky [name] getting her cpap machine which she is not able to use the one that she has as is broken.  I told her that since her last SS done 2021, is recommended by MD to have one to see if any changes in therapy, but she is needing machine now.  I told her that a sleep study is ordered and she is willing to have one done, but due to her machine (not usable) and she needs one soon, that I will proceed with sending the clinical notes and ss 2021 that is on file to hopefully get the process started.

## 2023-07-29 NOTE — Telephone Encounter (Signed)
 In-lab SS was ordered and is pending approval. We will need that before patient can get a new machine.

## 2023-07-29 NOTE — Telephone Encounter (Signed)
 Pt last saw Dr. Buck

## 2023-07-29 NOTE — Progress Notes (Signed)
 START ON PATHWAY REGIMEN - Non-Small Cell Lung     A cycle is every 21 days:     Pembrolizumab      Pemetrexed      Carboplatin   **Always confirm dose/schedule in your pharmacy ordering system**  Patient Characteristics: Stage IV Metastatic, Nonsquamous, Molecular Analysis Completed, Molecular Alteration Present and Targeted Therapy Exhausted OR KRAS G12C+ or HER2+ or NRG1+ or c-Met Present and No Prior Chemo/Immunotherapy OR No Alteration Present, Initial  Chemotherapy/Immunotherapy, PS = 0, 1, No Alteration Present, No Alteration Present, Candidate for Immunotherapy, PD-L1 Expression Positive 1-49% (TPS) / Negative / Not Tested / Awaiting Test Results and Immunotherapy Candidate Therapeutic Status: Stage IV Metastatic Histology: Nonsquamous Cell Broad Molecular Profiling Status: Molecular Analysis Completed Molecular Analysis Results: No Alteration Present ECOG Performance Status: 1 Chemotherapy/Immunotherapy Line of Therapy: Initial Chemotherapy/Immunotherapy EGFR Exons 18-21 Mutation Testing Status: Completed and Negative c-Met Overexpression (EGFR Wildtype) Testing Status: Completed and Negative ALK Fusion/Rearrangement Testing Status: Completed and Negative BRAF V600 Mutation Testing Status: Completed and Negative KRAS G12C Mutation Testing Status: Completed and Negative MET Exon 14 Mutation Testing Status: Completed and Negative RET Fusion/Rearrangement Testing Status: Completed and Negative NRG1 Fusion/Rearrangement Testing Status: Completed and Negative HER2 Mutation Testing Status: Completed and Negative NTRK Fusion/Rearrangement Testing Status: Completed and Negative ROS1 Fusion/Rearrangement Testing Status: Completed and Negative Immunotherapy Candidate Status: Candidate for Immunotherapy PD-L1 Expression Status: PD-L1 Negative Intent of Therapy: Non-Curative / Palliative Intent, Discussed with Patient

## 2023-07-30 ENCOUNTER — Ambulatory Visit
Admission: RE | Admit: 2023-07-30 | Discharge: 2023-07-30 | Disposition: A | Discharge status: Home / Self Care | Source: Ambulatory Visit | Attending: Radiation Oncology | Admitting: Radiation Oncology

## 2023-07-30 ENCOUNTER — Telehealth: Payer: Self-pay

## 2023-07-30 ENCOUNTER — Telehealth: Payer: Self-pay | Admitting: *Deleted

## 2023-07-30 ENCOUNTER — Telehealth: Payer: Self-pay | Admitting: Physician Assistant

## 2023-07-30 ENCOUNTER — Ambulatory Visit

## 2023-07-30 DIAGNOSIS — I639 Cerebral infarction, unspecified: Secondary | ICD-10-CM

## 2023-07-30 NOTE — Patient Outreach (Signed)
 Stroke Discharge Follow-up   07/30/2023 Name:  Becky [name] MRN:  [id number] DOB:  August 11, 1960  Subjective: Becky [name] is a 63 y.o. year old female who is a primary care patient of [name], MD An Emmi alert was received indicating patient responded to questions: Went to follow-up appointment?- No. Patient returned RN CM phone call and requested RN CM call her back. I reached out by phone to follow up on the alert and unable to speak with anyone.  Care Management Interventions:  Follow up plan: will attempt to reach pt on 07/31/23

## 2023-07-30 NOTE — Telephone Encounter (Signed)
 Spoke with Damien, in sleep lab this am.  She stated that the HST ordered back in 06-11-2023 by Amy NP was denied by [facility]  stating pt was eligible for new machine and did not need to have a sleep study done (see denial letter 06-30-2023) by Sleep lab. Will approve if (BMI dropped or gained by 10%, or not tolerate cpap).  Pts DL per wyvonne

## 2023-07-30 NOTE — Telephone Encounter (Signed)
 Order faxed to [facility] [phone] pgs (added order to attached p/w).  Sleep study, orders, demog, insurance. TOC to [facility] due to new insurance ([employer]).

## 2023-07-30 NOTE — Patient Outreach (Signed)
 Stroke Discharge Follow-up   07/30/2023 Name:  Becky [name] MRN:  [id number] DOB:  [birth year]  Subjective: Becky [name] is a 63 y.o. year old female who is a primary care patient of [name], MD An Emmi alert was received indicating patient responded to questions: Went to follow-up appointment?- No. I reached out by phone to follow up on the alert and was unable to reach patient.   Follow up plan: will attempt to reach pt on 07/31/23  [name] [facility], BSN RN Care Manager/ Transition of Care [place]/ [facility] [phone]

## 2023-07-30 NOTE — Addendum Note (Signed)
 Addended by: NEYSA NENA RAMAN on: 07/30/2023 02:33 PM   Modules accepted: Orders

## 2023-07-30 NOTE — Telephone Encounter (Signed)
 Left the patient a voicemail with the scheduled appointment details and to call back with any questions or to reschedule.

## 2023-07-31 ENCOUNTER — Telehealth: Payer: Self-pay | Admitting: *Deleted

## 2023-07-31 ENCOUNTER — Ambulatory Visit

## 2023-07-31 ENCOUNTER — Encounter: Payer: Self-pay | Admitting: Internal Medicine

## 2023-07-31 NOTE — Telephone Encounter (Signed)
 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  She was ok to order new machine, proceed with sleep study afterwards. HST was denied due to  pts [employer] insurance states if 10% weight gain or loss or pap intolerance.  Ordered NPSG (due to PAP intolerance).  From her last DL she was using last 5 days.  Faxed to [employer] the order and SS to them with fax confirmation.  Will f/u on community message (sent to them this am).

## 2023-07-31 NOTE — Telephone Encounter (Signed)
 RE: new cpap machine Received: Today Shoffner, Asberry Salt, Nena RAMAN, RN; [place], Mazurika; Rumalda Raring; Shoffner, Asberry Harari,  Order has been pulled and will have processed.  Thank you     Previous Messages    ----- Message ----- From: Salt Nena RAMAN, RN Sent: 07/31/2023   9:11 AM EDT To: Raring Rumalda; Mazurika Massenburg; Asberry Bunting* Subject: new cpap machine                              New order in EPIC.  I did fax to you all as well yesterday for this pt.  New cpap machine.  Becky [name] Female, 63 y.o., 15-Feb-1960 Pronouns: she/her/hers MRN: [id number] Phone: [phone] Thank you,  Particia

## 2023-07-31 NOTE — Patient Outreach (Signed)
 Stroke Discharge Follow-up   07/31/2023 Name:  Becky [name] MRN:  [id number] DOB:  03-06-1960  Subjective: Becky [name] is a 63 y.o. year old female who is a primary care patient of [name], MD An Emmi alert was received indicating patient responded to questions: Went to follow-up appointment?. I reached out by phone to follow up on the alert and spoke to Patient.  Care Management Interventions: Patient reports she went to primary care provider follow up appointment on 07/30/23 with Dr. Bertell SDOH screenings completed Medication review completed  Follow up plan: No further intervention required.   [name] [facility], BSN RN Care Manager/ Transition of Care [place]/ [facility] [phone]

## 2023-08-03 ENCOUNTER — Inpatient Hospital Stay

## 2023-08-03 ENCOUNTER — Inpatient Hospital Stay: Admitting: Internal Medicine

## 2023-08-03 ENCOUNTER — Ambulatory Visit

## 2023-08-04 ENCOUNTER — Ambulatory Visit

## 2023-08-04 ENCOUNTER — Other Ambulatory Visit: Payer: Self-pay | Admitting: Physician Assistant

## 2023-08-04 ENCOUNTER — Telehealth: Payer: Self-pay

## 2023-08-04 ENCOUNTER — Ambulatory Visit: Admitting: Gastroenterology

## 2023-08-04 MED ORDER — PROCHLORPERAZINE MALEATE 10 MG PO TABS: 10.0000 mg | ORAL_TABLET | Freq: Four times a day (QID) | ORAL | 2 refills | Status: DC | PRN

## 2023-08-04 NOTE — Progress Notes (Signed)
 Pharmacist Chemotherapy Monitoring - Initial Assessment    Anticipated start date: 08/11/23   The following has been reviewed per standard work regarding the patient's treatment regimen: The patient's diagnosis, treatment plan and drug doses, and organ/hematologic function Lab orders and baseline tests specific to treatment regimen  The treatment plan start date, drug sequencing, and pre-medications Prior authorization status  Patient's documented medication list, including drug-drug interaction screen and prescriptions for anti-emetics and supportive care specific to the treatment regimen The drug concentrations, fluid compatibility, administration routes, and timing of the medications to be used The patient's access for treatment and lifetime cumulative dose history, if applicable  The patient's medication allergies and previous infusion related reactions, if applicable   Changes made to treatment plan:  N/A  Follow up needed:  Pending authorization for treatment  Documented allergy to Prednisone .  Tolerates methylpred.  Will check w/ provider re: pref for Dexamethasone  IV.   Wilma Dollar, Pharm.D., CPP 08/04/2023@3 :53 PM

## 2023-08-04 NOTE — Telephone Encounter (Signed)
 Tried to reach patient regarding a message sent from Radiation about the patient inquiring about a shampoo that assists with hair growth. LVM with some examples and asked for a return call with any questions. Examples provided include:  [employer] Rosemary Mint Strengthening Shampoo  Shea[employer]isture Jamaican Black Castor Oil Strengthen & Restore Shampoo  Camille Rose Sweet Ginger Cleansing Rinse  TGIN Moisture Rich Sulfate Free Shampoo

## 2023-08-05 ENCOUNTER — Ambulatory Visit

## 2023-08-05 ENCOUNTER — Encounter: Payer: Self-pay | Admitting: Gastroenterology

## 2023-08-05 ENCOUNTER — Encounter: Payer: Self-pay | Admitting: Pharmacist

## 2023-08-05 ENCOUNTER — Other Ambulatory Visit: Payer: Self-pay

## 2023-08-05 ENCOUNTER — Encounter: Payer: Self-pay | Admitting: Internal Medicine

## 2023-08-05 ENCOUNTER — Other Ambulatory Visit: Payer: Self-pay | Admitting: Cardiology

## 2023-08-05 MED ORDER — OLMESARTAN MEDOXOMIL 40 MG PO TABS: 40.0000 mg | ORAL_TABLET | Freq: Every day | ORAL | 3 refills | Status: DC

## 2023-08-05 MED ORDER — ROSUVASTATIN CALCIUM 20 MG PO TABS: 20.0000 mg | ORAL_TABLET | Freq: Every day | ORAL | 3 refills | Status: DC

## 2023-08-05 MED ORDER — SPIRONOLACTONE 25 MG PO TABS: 25.0000 mg | ORAL_TABLET | Freq: Every day | ORAL | 3 refills | Status: DC

## 2023-08-05 NOTE — Progress Notes (Signed)
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 Uncovertebral spurring contributes to moderate right foraminal stenosis. C6-C7: No significant disc herniation. No spinal canal stenosis or neural foraminal narrowing. C7-T1: Mild disc bulging without significant stenosis. IMPRESSION:  1. No evidence of metastatic disease in the cervical spine. 2. Moderate left foraminal stenosis at C4-C5 and moderate right foraminal stenosis at C5-C6 due to uncovertebral spurring and disc osteophyte complexes. Electronically signed by: Lonni Necessary MD 07/14/2023 05:39 AM EDT RP Workstation: [id number]   CT ANGIO HEAD NECK W WO CM Result Date: 07/13/2023 CLINICAL DATA:  Stroke/TIA, determine embolic source EXAM: CT ANGIOGRAPHY HEAD AND NECK WITH AND WITHOUT CONTRAST TECHNIQUE: Multidetector CT imaging of the head and neck was performed using the standard protocol during bolus administration of intravenous contrast. Multiplanar CT image reconstructions and MIPs were obtained to evaluate the vascular anatomy. Carotid stenosis measurements (when applicable) are obtained utilizing NASCET criteria, using the distal internal carotid diameter as the denominator. RADIATION DOSE REDUCTION: This exam was performed according to the departmental  dose-optimization program which includes automated exposure control, adjustment of the mA and/or kV according to patient size and/or use of iterative reconstruction technique. CONTRAST:  75mL OMNIPAQUE  IOHEXOL  350 MG/ML SOLN COMPARISON:  None Available. FINDINGS: CT HEAD FINDINGS Brain: No evidence of acute infarction, hemorrhage, hydrocephalus, extra-axial collection or mass lesion/mass effect. Vascular: See below. Skull: Normal. Negative for fracture or focal lesion. Sinuses/Orbits: Clear sinuses.  No acute orbital findings. Review of the MIP images confirms the above findings CTA NECK FINDINGS Aortic arch: Great vessel origins are patent. Right carotid system: No evidence of dissection, stenosis (50% or greater), or occlusion. Left carotid system: No evidence of dissection, stenosis (50% or greater), or occlusion. Vertebral arteries: Left dominant. Poor visualization of the proximal right vertebral artery due to streak artifact. No evidence of dissection, stenosis (50% or greater), or occlusion. Skeleton: No acute abnormality on limited assessment. Other neck: No acute abnormality on limited assessment. Upper chest: Increased size of a known right upper lobe pulmonary. New surrounding consolidation. Review of the MIP images confirms the above findings CTA HEAD FINDINGS Anterior circulation: Bilateral intracranial ICAs, MCAs and ACAs are patent without proximal hemodynamically significant stenosis. Posterior circulation: Bilateral intradural vertebral arteries, basilar artery and bilateral posterior cerebral arteries are patent without proximal hemodynamically significant stenosis. Venous sinuses: As permitted by contrast timing, patent. Review of the MIP images confirms the above findings IMPRESSION: 1. No emergent large vessel occlusion or proximal hemodynamically significant stenosis. 2. Increased size of a known right upper lobe pulmonary. New surrounding consolidation, concerning for postobstructive  pneumonia. Electronically Signed   By: Gilmore GORMAN Molt M.D.   On: 07/13/2023 21:21   ECHOCARDIOGRAM COMPLETE BUBBLE STUDY Result Date: 07/13/2023    ECHOCARDIOGRAM REPORT   Patient Name:   Becky [name] Date of Exam: 07/13/2023 Medical Rec #:  [id number]         Height:       67.0 in Accession #:    [id number]        Weight:       243.0 lb Date of Birth:  1960-06-29         BSA:          2.197 m Patient Age:    63 years          BP:           165/78 mmHg Patient Gender: F                 HR:           82 bpm. Exam Location:  Inpatient Procedure: 2D Echo, Cardiac Doppler, Color Doppler and Saline Contrast Bubble  Study (Both Spectral and Color Flow Doppler were utilized during            procedure). Indications:    TIA  History:        Patient has prior history of Echocardiogram examinations, most                 recent 04/09/2023. Abnormal ECG, Stroke, Arrythmias:Atrial                 Fibrillation, Signs/Symptoms:Chest Pain, Shortness of Breath and                 Dyspnea; Risk Factors:Hypertension, Diabetes, Dyslipidemia and                 Sleep Apnea. Metastatic lung cancer.  Sonographer:    Ellouise Mose RDCS Referring Phys: [id number] [facility] ARRIEN  Sonographer Comments: Technically difficult study due to poor echo windows. Image acquisition challenging due to patient body habitus. IMPRESSIONS  1. Left ventricular ejection fraction, by estimation, is 55 to 60%. The left ventricle has normal function. The left ventricle has no regional wall motion abnormalities. There is mild left ventricular hypertrophy. Left ventricular diastolic parameters are indeterminate.  2. Right ventricular systolic function was not well visualized. The right ventricular size is not well visualized.  3. The mitral valve is normal in structure. No evidence of mitral valve regurgitation. No evidence of mitral stenosis.  4. Cannot rule out mobile echodensity on the non coronary cusp. The aortic valve is tricuspid. There is  moderate calcification of the aortic valve. Aortic valve regurgitation is moderate. Aortic valve sclerosis is present, with no evidence of aortic valve stenosis.  5. The inferior vena cava is dilated in size with >50% respiratory variability, suggesting right atrial pressure of 8 mmHg.  6. Agitated saline contrast bubble study was negative, with no evidence of any interatrial shunt. FINDINGS  Left Ventricle: Left ventricular ejection fraction, by estimation, is 55 to 60%. The left ventricle has normal function. The left ventricle has no regional wall motion abnormalities. The left ventricular internal cavity size was normal in size. There is  mild left ventricular hypertrophy. Left ventricular diastolic parameters are indeterminate. Right Ventricle: The right ventricular size is not well visualized. Right vetricular wall thickness was not well visualized. Right ventricular systolic function was not well visualized. Left Atrium: Left atrial size was normal in size. Right Atrium: Right atrial size was normal in size. Pericardium: Trivial pericardial effusion is present. Mitral Valve: The mitral valve is normal in structure. No evidence of mitral valve regurgitation. No evidence of mitral valve stenosis. Tricuspid Valve: The tricuspid valve is normal in structure. Tricuspid valve regurgitation is not demonstrated. No evidence of tricuspid stenosis. Aortic Valve: Cannot rule out mobile echodensity on the non coronary cusp. The aortic valve is tricuspid. There is moderate calcification of the aortic valve. Aortic valve regurgitation is moderate. Aortic valve sclerosis is present, with no evidence of aortic valve stenosis. Pulmonic Valve: The pulmonic valve was normal in structure. Pulmonic valve regurgitation is not visualized. No evidence of pulmonic stenosis. Aorta: The aortic root and ascending aorta are structurally normal, with no evidence of dilitation. Venous: The inferior vena cava is dilated in size with greater  than 50% respiratory variability, suggesting right atrial pressure of 8 mmHg. IAS/Shunts: No atrial level shunt detected by color flow Doppler. Agitated saline contrast was given intravenously to evaluate for intracardiac shunting. Agitated saline contrast bubble study was negative, with no evidence of any interatrial  shunt. Additional Comments: 3D was performed not requiring image post processing on an independent workstation and was indeterminate.  LEFT VENTRICLE PLAX 2D LVIDd:         5.10 cm LVIDs:         3.60 cm LV PW:         1.10 cm LV IVS:        1.10 cm LVOT diam:     2.30 cm LV SV:         93 LV SV Index:   43 LVOT Area:     4.15 cm  LV Volumes (MOD) LV vol d, MOD A2C: 74.2 ml LV vol d, MOD A4C: 74.1 ml LV vol s, MOD A2C: 21.4 ml LV vol s, MOD A4C: 28.9 ml LV SV MOD A2C:     52.8 ml LV SV MOD A4C:     74.1 ml LV SV MOD BP:      51.9 ml IVC IVC diam: 2.20 cm LEFT ATRIUM             Index        RIGHT ATRIUM           Index LA Vol (A2C):   14.5 ml 6.60 ml/m   RA Area:     17.60 cm LA Vol (A4C):   47.8 ml 21.76 ml/m  RA Volume:   43.80 ml  19.94 ml/m LA Biplane Vol: 29.3 ml 13.34 ml/m  AORTIC VALVE LVOT Vmax:   144.00 cm/s LVOT Vmean:  84.600 cm/s LVOT VTI:    0.225 m  AORTA Ao Root diam: 3.70 cm Ao Asc diam:  3.00 cm MITRAL VALVE MV Area (PHT): 3.39 cm     SHUNTS MV Decel Time: 224 msec     Systemic VTI:  0.22 m MV E velocity: 106.60 cm/s  Systemic Diam: 2.30 cm Maude Emmer MD Electronically signed by Maude Emmer MD Signature Date/Time: 07/13/2023/3:10:44 PM    Final    MR BRAIN W WO CONTRAST Result Date: 07/12/2023 CLINICAL DATA:  Neuro deficit, concern for stroke. Pain radiating down left arm with tingling. EXAM: MRI HEAD WITHOUT AND WITH CONTRAST TECHNIQUE: Multiplanar, multiecho pulse sequences of the brain and surrounding structures were obtained without and with intravenous contrast. CONTRAST:  10mL GADAVIST  GADOBUTROL  1 MMOL/ML IV SOLN COMPARISON:  CT head 04/14/2023.  MRI head 04/07/2023.  FINDINGS: Brain: There are numerous peripherally enhancing lesions throughout the cerebral hemispheres and cerebellum. Reference lesions described as follows: 9 mm left cerebellar lesion (series 18, image 53) 5 mm lesion involving the superior aspect of the right middle cerebellar peduncle (image 65). 5 mm anterior right frontal lesion (image 94). 6 mm left frontal operculum (image 96) 5 mm parasagittal left frontal lesion near the vertex (image 146) Multiple lesions demonstrate diffusion signal abnormality. There is edema surrounding the left cerebellar lesion with mild local mass effect. Additional mild focus of edema within the inferior right cerebellum without associated enhancing lesion. Numerous additional foci of diffusion signal abnormality in the cerebral hemispheres and left cerebellum without definite ADC signal abnormality which may reflect late acute versus subacute infarcts. 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 No midline shift. Small chronic microhemorrhage in the lateral left occipital lobe. Scattered T2/FLAIR hyperintensity in the supratentorial white matter. The basilar cisterns are patent. No extra-axial fluid collections. Ventricles: Normal size and configuration of the ventricles. Vascular: Skull base flow voids are visualized. Skull and upper cervical spine: No focal abnormality. Sinuses/Orbits: Orbits are symmetric. Paranasal sinuses are  clear. Other: Mastoid air cells are clear. IMPRESSION: Numerous enhancing lesions in the cerebral hemispheres and cerebellum concerning for intracranial metastatic disease. Dominant lesion in the left cerebellum demonstrates surrounding enhancement and mild local mass effect. No midline shift. Scattered foci of diffusion signal abnormality throughout the cerebral hemispheres and left cerebellum some of which correspond to enhancing lesions. However the majority of these foci likely reflect acute/subacute infarct likely of embolic etiology. Mild chronic  microvascular ischemic changes. 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 Electronically Signed   By: Donnice Mania M.D.   On: 07/12/2023 21:02   DG Chest Portable 1 View Result Date: 07/12/2023 CLINICAL DATA:  Chest pain EXAM: PORTABLE CHEST 1 VIEW COMPARISON:  06/04/2023, CT 05/28/2023 FINDINGS: Left-sided central venous port tip over the SVC. Low lung volumes. Borderline cardiac enlargement. Vague right upper lobe opacity, presumably corresponds to history of CT demonstrated lung mass. IMPRESSION: Low lung volumes. Vague right upper lobe opacity, presumably corresponds to history of CT demonstrated lung mass. Electronically Signed   By: Luke Bun M.D.   On: 07/12/2023 19:14   IR IMAGING GUIDED PORT INSERTION Result Date: 07/06/2023 CLINICAL DATA:  Right upper lobe lung carcinoma and need for porta cath for chemotherapy. EXAM: IMPLANTED PORT A CATH PLACEMENT WITH ULTRASOUND AND FLUOROSCOPIC GUIDANCE ANESTHESIA/SEDATION: Moderate (conscious) sedation was employed during this procedure. A total of Versed  2.0 mg and Fentanyl  100 mcg was administered intravenously. Moderate Sedation Time: 40 minutes. The patient's level of consciousness and vital signs were monitored continuously by radiology nursing throughout the procedure under my direct supervision. FLUOROSCOPY: Radiation Exposure Index: 7.1 mGy Kerma PROCEDURE: The procedure, risks, benefits, and alternatives were explained to the patient. Questions regarding the procedure were encouraged and answered. The patient understands and consents to the procedure. A time-out was performed prior to initiating the procedure. Ultrasound was utilized to confirm patency of the left internal jugular vein. An ultrasound image was saved and recorded. The left neck and chest were prepped with chlorhexidine  in a sterile fashion, and a sterile drape was applied covering the operative field. Maximum barrier sterile technique with sterile gowns and gloves  were used for the procedure. Local anesthesia was provided with 1% lidocaine . After creating a small venotomy incision, a 21 gauge needle was advanced into the left internal jugular vein under direct, real-time ultrasound guidance. Ultrasound image documentation was performed. After securing guidewire access, an 8 Fr dilator was placed. A J-wire was kinked to measure appropriate catheter length. A subcutaneous port pocket was then created along the upper chest wall utilizing sharp and blunt dissection. Portable cautery was utilized. The pocket was irrigated with sterile saline. A single lumen power injectable port was chosen for placement. The 8 Fr catheter was tunneled from the port pocket site to the venotomy incision. The port was placed in the pocket. External catheter was trimmed to appropriate length based on guidewire measurement. At the venotomy, an 8 Fr peel-away sheath was placed over a guidewire. The catheter was then placed through the sheath and the sheath removed. Final catheter positioning was confirmed and documented with a fluoroscopic spot image. The port was accessed with a needle and aspirated and flushed with heparinized saline. The access needle was removed. The venotomy and port pocket incisions were closed with subcutaneous 3-0 Monocryl and subcuticular 4-0 Vicryl. Dermabond was applied to both incisions. COMPLICATIONS: COMPLICATIONS None FINDINGS: After catheter placement, the tip lies at the cavo-atrial junction. The catheter aspirates normally and is ready for immediate use. IMPRESSION:  Placement of single lumen port a cath via left internal jugular vein. The catheter tip lies at the cavo-atrial junction. A power injectable port a cath was placed and is ready for immediate use. Electronically Signed   By: Marcey Moan M.D.   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 This was diagnosed and May 2025. Her molecular studies show TMB high and MET over-expression which can be used in the second line setting.      The original plan was to undergo concurrent chemoradiation with weekly carboplatin for an AUC of 2 and paclitaxel 45 mg/m the first dose was expected on 07/13/2023 but the patient presented to the emergency room with a CVA and was found to have metastatic disease to the brain.  She completed radiation to the right rib, right lung bronchus, and brain under the care of Dr. Shannon on 07/29/23.   She is scheduled to start palliative systemic chemotherapy with carboplatin for an AUC of 5, alimta 500 mg/m2, and keytruda 200 mg IV every 3 weeks. First dose expected today 08/10/23.   Labs were reviewed. Her initial labs show her platelet count of 25k which would be unusual since she has not started any treatment. We will repeat this today also with platelet by citrate. As long as her plt is >100k, she will proceed with cycle #1 tomorrow as scheduled.   Update: We rechecked the patient's labs today which continue to show new unexplained thrombocytopenia which was manually rechecked and she had platelet by citrate.  The patient denies any new medications except taking beet root last week.  I reviewed with Dr. Sherrod.  We will give her a Medrol  Dosepak and recheck her labs next week.  She is not able to proceed with treatment tomorrow as planned.  I have deferred her care plan for 1 week.  She will take the Medrol  Dosepak.  When she finishes this she will resume taking her Decadron  taper.  Bleeding precautions will be reviewed with the patient.  We will recheck her labs next week.  No platelet transfusion indicated at this time per Dr.  Sherrod.  We will see her back for labs and follow up visit in 1 week for toxicity check and repeat labs.   She will continue her xarelto  for her atrial fibrillation and Hx CVA   Use Glycerna nutritional drinks to supplement diet and maintain weight.   Obstructive sleep apnea Obstructive sleep apnea managed with CPAP, which is malfunctioning. - Contact [facility] to service or replace CPAP machine.   Nausea -We reviewed her anti-emetics today should she need them. She does not have nausea at this time.   Peripheral edema Experiences peripheral edema, likely due to Decadron  and atrial fibrillation. Swelling improves overnight. Using compression stockings and pedal exerciser. - Use compression stockings during the day. - Elevate legs when seated. - Use pedal exerciser to improve circulation. - Monitor weight and use diuretics as needed as instructed by cardiology to ensure no significant fluid retention, ensuring kidney function is stable and blood pressure permits, which it does today.    The patient was advised to call immediately if she has any concerning symptoms  in the interval. The patient voices understanding of current disease status and treatment options and is in agreement with the current care plan. All questions were answered. The patient knows to call the clinic with any problems, questions or concerns. We can certainly see the patient much sooner if necessary         No orders of the defined types were placed in this encounter.    The total time spent in the appointment was 30-39 minutes  Vallarie Fei L Khiry Pasquariello, PA-C 08/05/23

## 2023-08-06 ENCOUNTER — Telehealth: Payer: Self-pay

## 2023-08-06 ENCOUNTER — Ambulatory Visit

## 2023-08-06 ENCOUNTER — Telehealth: Payer: Self-pay | Admitting: Medical Oncology

## 2023-08-06 ENCOUNTER — Encounter: Payer: Self-pay | Admitting: Internal Medicine

## 2023-08-06 ENCOUNTER — Encounter: Payer: Self-pay | Admitting: Medical Oncology

## 2023-08-06 NOTE — Telephone Encounter (Signed)
 Pt gave me the names of her over the counter supplements/spices/herbs/plant ,vitamin E, oregano oil, , saffron, Dandelion root, ashwaganda, Beet root. Added to [facility].

## 2023-08-06 NOTE — Progress Notes (Signed)
 Per Asberry Macintosh RPH, I called [facility] and left this VM::   Vitamin E could decrease effectiveness of alkylating agents (I.e. carboplatin) - would recommend holding off Vitamin E while on carbo. - Vitamin E could decrease efficacy of diltiazem , and beetroot could in theory increase levels of diltiazem , thus more side effects. Would recommend avoiding both   - Dandelion, oregano, beetroot and vitamin e could increase risk of bleed/bruising with her aspirin  and Xarelto  - would recommend avoiding, especially if she starts having cytopenias on therapy. - Ashwagandha may increase side effects of levothyroxine     Overall, I would not recommend any of them - there's not robust evidence supporting these interactions, but at least while she's on treatment, I would recommend holding the vitamin E, dandelion and saffron. Would also recommend holding the beetroot due to interactions with diltiazem .

## 2023-08-06 NOTE — Telephone Encounter (Signed)
 Received a VM from [place] at [facility] in regards to patients Lorazepam .  She asked for a return call at [phone]. Message routed to Radiation team for review.

## 2023-08-07 ENCOUNTER — Other Ambulatory Visit: Payer: Self-pay | Admitting: Student

## 2023-08-07 ENCOUNTER — Other Ambulatory Visit: Payer: Self-pay | Admitting: Internal Medicine

## 2023-08-07 ENCOUNTER — Other Ambulatory Visit: Payer: Self-pay

## 2023-08-07 ENCOUNTER — Ambulatory Visit: Payer: Self-pay

## 2023-08-08 ENCOUNTER — Other Ambulatory Visit (HOSPITAL_COMMUNITY): Payer: Self-pay

## 2023-08-08 ENCOUNTER — Encounter: Payer: Self-pay | Admitting: Internal Medicine

## 2023-08-10 ENCOUNTER — Other Ambulatory Visit: Payer: Self-pay

## 2023-08-10 ENCOUNTER — Ambulatory Visit: Payer: Self-pay

## 2023-08-10 ENCOUNTER — Inpatient Hospital Stay

## 2023-08-10 ENCOUNTER — Telehealth: Payer: Self-pay

## 2023-08-10 ENCOUNTER — Other Ambulatory Visit: Payer: Self-pay | Admitting: *Deleted

## 2023-08-10 ENCOUNTER — Telehealth: Payer: Self-pay | Admitting: Medical Oncology

## 2023-08-10 ENCOUNTER — Other Ambulatory Visit

## 2023-08-10 ENCOUNTER — Ambulatory Visit: Admitting: Physician Assistant

## 2023-08-10 ENCOUNTER — Inpatient Hospital Stay: Attending: Physician Assistant | Admitting: Physician Assistant

## 2023-08-10 ENCOUNTER — Ambulatory Visit

## 2023-08-10 ENCOUNTER — Telehealth: Payer: Self-pay | Admitting: Cardiology

## 2023-08-10 VITALS — BP 159/62 | HR 93 | Temp 97.5°F | Resp 18 | Ht 67.0 in | Wt 261.5 lb

## 2023-08-10 DIAGNOSIS — C3411 Malignant neoplasm of upper lobe, right bronchus or lung: Secondary | ICD-10-CM | POA: Diagnosis present

## 2023-08-10 DIAGNOSIS — D696 Thrombocytopenia, unspecified: Secondary | ICD-10-CM

## 2023-08-10 DIAGNOSIS — Z79890 Hormone replacement therapy (postmenopausal): Secondary | ICD-10-CM | POA: Insufficient documentation

## 2023-08-10 DIAGNOSIS — G4733 Obstructive sleep apnea (adult) (pediatric): Secondary | ICD-10-CM | POA: Insufficient documentation

## 2023-08-10 DIAGNOSIS — Z79899 Other long term (current) drug therapy: Secondary | ICD-10-CM | POA: Insufficient documentation

## 2023-08-10 DIAGNOSIS — C3491 Malignant neoplasm of unspecified part of right bronchus or lung: Secondary | ICD-10-CM

## 2023-08-10 DIAGNOSIS — Z7952 Long term (current) use of systemic steroids: Secondary | ICD-10-CM | POA: Diagnosis not present

## 2023-08-10 DIAGNOSIS — Z7901 Long term (current) use of anticoagulants: Secondary | ICD-10-CM | POA: Diagnosis not present

## 2023-08-10 DIAGNOSIS — Z7982 Long term (current) use of aspirin: Secondary | ICD-10-CM | POA: Insufficient documentation

## 2023-08-10 DIAGNOSIS — I4891 Unspecified atrial fibrillation: Secondary | ICD-10-CM | POA: Diagnosis not present

## 2023-08-10 DIAGNOSIS — R600 Localized edema: Secondary | ICD-10-CM | POA: Diagnosis not present

## 2023-08-10 DIAGNOSIS — Z923 Personal history of irradiation: Secondary | ICD-10-CM | POA: Insufficient documentation

## 2023-08-10 DIAGNOSIS — Z7984 Long term (current) use of oral hypoglycemic drugs: Secondary | ICD-10-CM | POA: Diagnosis not present

## 2023-08-10 DIAGNOSIS — Z5111 Encounter for antineoplastic chemotherapy: Secondary | ICD-10-CM | POA: Insufficient documentation

## 2023-08-10 DIAGNOSIS — Z95828 Presence of other vascular implants and grafts: Secondary | ICD-10-CM

## 2023-08-10 DIAGNOSIS — C7951 Secondary malignant neoplasm of bone: Secondary | ICD-10-CM | POA: Diagnosis not present

## 2023-08-10 DIAGNOSIS — C7931 Secondary malignant neoplasm of brain: Secondary | ICD-10-CM | POA: Insufficient documentation

## 2023-08-10 DIAGNOSIS — Z79620 Long term (current) use of immunosuppressive biologic: Secondary | ICD-10-CM | POA: Insufficient documentation

## 2023-08-10 DIAGNOSIS — Z452 Encounter for adjustment and management of vascular access device: Secondary | ICD-10-CM | POA: Diagnosis not present

## 2023-08-10 DIAGNOSIS — C7972 Secondary malignant neoplasm of left adrenal gland: Secondary | ICD-10-CM | POA: Diagnosis not present

## 2023-08-10 DIAGNOSIS — Z5112 Encounter for antineoplastic immunotherapy: Secondary | ICD-10-CM | POA: Insufficient documentation

## 2023-08-10 LAB — CBC WITH DIFFERENTIAL (CANCER CENTER ONLY)
Abs Immature Granulocytes: 0.13 K/uL — ABNORMAL HIGH (ref 0.00–0.07)
Basophils Absolute: 0 K/uL (ref 0.0–0.1)
Basophils Relative: 0 %
Eosinophils Absolute: 0 K/uL (ref 0.0–0.5)
Eosinophils Relative: 0 %
HCT: 35 % — ABNORMAL LOW (ref 36.0–46.0)
Hemoglobin: 11.9 g/dL — ABNORMAL LOW (ref 12.0–15.0)
Immature Granulocytes: 1 %
Lymphocytes Relative: 3 %
Lymphs Abs: 0.3 K/uL — ABNORMAL LOW (ref 0.7–4.0)
MCH: 30.6 pg (ref 26.0–34.0)
MCHC: 34 g/dL (ref 30.0–36.0)
MCV: 90 fL (ref 80.0–100.0)
Monocytes Absolute: 0.6 K/uL (ref 0.1–1.0)
Monocytes Relative: 6 %
Neutro Abs: 9.6 K/uL — ABNORMAL HIGH (ref 1.7–7.7)
Neutrophils Relative %: 90 %
Platelet Count: 25 K/uL — ABNORMAL LOW (ref 150–400)
RBC: 3.89 MIL/uL (ref 3.87–5.11)
RDW: 15.1 % (ref 11.5–15.5)
Smear Review: DECREASED
WBC Count: 10.6 K/uL — ABNORMAL HIGH (ref 4.0–10.5)
nRBC: 0 % (ref 0.0–0.2)

## 2023-08-10 LAB — CMP (CANCER CENTER ONLY)
ALT: 30 U/L (ref 0–44)
AST: 14 U/L — ABNORMAL LOW (ref 15–41)
Albumin: 3.9 g/dL (ref 3.5–5.0)
Alkaline Phosphatase: 72 U/L (ref 38–126)
Anion gap: 6 (ref 5–15)
BUN: 22 mg/dL (ref 8–23)
CO2: 26 mmol/L (ref 22–32)
Calcium: 9.3 mg/dL (ref 8.9–10.3)
Chloride: 105 mmol/L (ref 98–111)
Creatinine: 0.94 mg/dL (ref 0.44–1.00)
GFR, Estimated: >60 mL/min (ref >60)
Glucose, Bld: 171 mg/dL — ABNORMAL HIGH (ref 70–99)
Potassium: 4.1 mmol/L (ref 3.5–5.1)
Sodium: 137 mmol/L (ref 135–145)
Total Bilirubin: 0.6 mg/dL (ref 0.0–1.2)
Total Protein: 6 g/dL — ABNORMAL LOW (ref 6.5–8.1)

## 2023-08-10 LAB — WBC/PLT IN CITRATE

## 2023-08-10 LAB — TSH: TSH: 0.976 u[IU]/mL (ref 0.350–4.500)

## 2023-08-10 MED ORDER — METHYLPREDNISOLONE 4 MG PO TBPK: ORAL_TABLET | ORAL | 0 refills | Status: DC

## 2023-08-10 MED ORDER — SODIUM CHLORIDE 0.9% FLUSH
10.0000 mL | Freq: Once | INTRAVENOUS | Status: AC
Start: 2023-08-10 — End: 2023-08-10
  Administered 2023-08-10: 10 mL

## 2023-08-10 NOTE — Telephone Encounter (Signed)
 Patient was seen in our office today for a scheduled appointment. Labs were reviewed, and platelet levels were found to be low. A repeat platelet count was performed and confirmed low platelets.  Spoke with the patient and her children to review the plan of care. Per provider instructions, the patient is to begin a Medrol  dose pack and take it as prescribed. She was instructed to hold Decadron  until she completes the Medrol  dose pack, after which she may resume Decadron  from where she left off.  Labs will be rechecked next week. If within acceptable range, the patient will proceed with her treatment next week.  Reviewed bleeding precautions thoroughly with the patient and her children. Patient reported that she is currently taking aspirin  and Xarelto . Contacted Dr. Godfrey office (cardiology) to request guidance on whether the patient should continue aspirin  and Xarelto  while on the Medrol  dose pack. Awaiting a call back from their office. Once recommendations are received, patient and family will be notified accordingly.

## 2023-08-10 NOTE — Telephone Encounter (Signed)
 Caller Edwardo) stated patient had labs drawn today and noticed that patient's white blood cell count was 25,000 and wants a recommendation on if patient should continue on aspirin  and rivaroxaban  (XARELTO ) 20 MG TABS tablet if she is having Medrol  steriod pack.

## 2023-08-10 NOTE — Telephone Encounter (Signed)
 Spoke with patient and informed her that, per Carley at Dr. Godfrey office, she needs to continue taking aspirin  81 mg and Xarelto  despite a platelet count of 25,000. Instructed patient to call immediately if she experiences any signs or symptoms of bleeding.

## 2023-08-10 NOTE — Progress Notes (Signed)
 error

## 2023-08-10 NOTE — Telephone Encounter (Signed)
 Spoke with [facility] center and advised for patient to continue both xarelto  and aspirin 

## 2023-08-11 ENCOUNTER — Inpatient Hospital Stay

## 2023-08-11 ENCOUNTER — Other Ambulatory Visit

## 2023-08-11 ENCOUNTER — Inpatient Hospital Stay: Admitting: Internal Medicine

## 2023-08-11 ENCOUNTER — Inpatient Hospital Stay: Admitting: Dietician

## 2023-08-11 ENCOUNTER — Encounter: Payer: Self-pay | Admitting: Internal Medicine

## 2023-08-11 ENCOUNTER — Ambulatory Visit: Payer: Self-pay

## 2023-08-11 LAB — T4: T4, Total: 6 ug/dL (ref 4.5–12.0)

## 2023-08-11 NOTE — Radiation Completion Notes (Signed)
  Radiation Oncology         [phone] ________________________________  Name: Becky [name] MRN: [id number]  Date of Service: 07/30/2023  DOB: 20-May-1960  End of Treatment Note   Diagnosis: Stage IVB (cT2a, cNX, cM1c2) NSCLC with metastases to adrenal gland, bone, and brain. Intent: Palliative   ==========DELIVERED PLANS==========  First Treatment Date: 2023-07-08 Last Treatment Date: 2023-07-29   Plan Name: Lung_R Site: Bronchus, Right Technique: 3D Mode: Photon Dose Per Fraction: 2 Gy Prescribed Dose (Delivered / Prescribed): 14 Gy / 14 Gy Prescribed Fxs (Delivered / Prescribed): 7 / 7   Plan Name: Lung_R:1 Site: Bronchus, Right Technique: 3D Mode: Photon Dose Per Fraction: 2 Gy Prescribed Dose (Delivered / Prescribed): 12 Gy / 12 Gy Prescribed Fxs (Delivered / Prescribed): 6 / 6   Plan Name: Chest_R_Rib Site: Ribs, Right Technique: 3D Mode: Photon Dose Per Fraction: 4 Gy Prescribed Dose (Delivered / Prescribed): 20 Gy / 20 Gy Prescribed Fxs (Delivered / Prescribed): 5 / 5   Plan Name: Brain Site: Brain Technique: Isodose Plan Mode: Photon Dose Per Fraction: 3 Gy Prescribed Dose (Delivered / Prescribed): 30 Gy / 30 Gy Prescribed Fxs (Delivered / Prescribed): 10 / 10     ====================================   The patient tolerated radiation. She noted resolution of her headaches with Decadron  and felt much better in terms of energy while on it. She was given instructions for Decadron  taper. Patient noted mild worsening shortness of breath for which her Albuterol  was refilled.   The patient will return in one month and will continue follow up with Dr. Sherrod as well.      Ronita Due, PA-C

## 2023-08-12 ENCOUNTER — Ambulatory Visit: Payer: Self-pay

## 2023-08-12 ENCOUNTER — Telehealth: Payer: Self-pay

## 2023-08-12 ENCOUNTER — Telehealth: Payer: Self-pay | Admitting: Internal Medicine

## 2023-08-12 ENCOUNTER — Other Ambulatory Visit: Payer: Self-pay

## 2023-08-12 ENCOUNTER — Encounter: Payer: Self-pay | Admitting: Internal Medicine

## 2023-08-12 DIAGNOSIS — K219 Gastro-esophageal reflux disease without esophagitis: Secondary | ICD-10-CM

## 2023-08-12 MED ORDER — DEXLANSOPRAZOLE 60 MG PO CPDR: 60.0000 mg | DELAYED_RELEASE_CAPSULE | Freq: Every day | ORAL | 0 refills | Status: DC

## 2023-08-12 NOTE — Progress Notes (Signed)
 Phoned the pt and asked was she able to get her Dexilant  she replied yes but needed a refill. One refill given and the pt was advised she needed a follow up appt which she agreed too. Pt to schedule appt

## 2023-08-12 NOTE — Telephone Encounter (Signed)
 Scheduled and rescheduled appointments with the patient. She is aware of the appointment changes.

## 2023-08-12 NOTE — Telephone Encounter (Signed)
 PA done on [employer] for Dexilant  60mg . Dx used: K21.9. pt has tried / failed: Pantoprazole , Nexium  and Omeprazole. Generic does not work for the pt and has been documented. Waiting on a response from [employer].

## 2023-08-13 ENCOUNTER — Ambulatory Visit: Payer: Self-pay

## 2023-08-13 ENCOUNTER — Ambulatory Visit: Admitting: Neurology

## 2023-08-13 ENCOUNTER — Encounter: Payer: Self-pay | Admitting: Internal Medicine

## 2023-08-13 ENCOUNTER — Inpatient Hospital Stay: Admitting: Dietician

## 2023-08-13 ENCOUNTER — Encounter: Payer: Self-pay | Admitting: Neurology

## 2023-08-13 VITALS — BP 135/76 | HR 89 | Ht 67.0 in | Wt 259.0 lb

## 2023-08-13 DIAGNOSIS — G4733 Obstructive sleep apnea (adult) (pediatric): Secondary | ICD-10-CM | POA: Diagnosis not present

## 2023-08-13 DIAGNOSIS — C3491 Malignant neoplasm of unspecified part of right bronchus or lung: Secondary | ICD-10-CM

## 2023-08-13 DIAGNOSIS — Z8673 Personal history of transient ischemic attack (TIA), and cerebral infarction without residual deficits: Secondary | ICD-10-CM | POA: Diagnosis not present

## 2023-08-13 NOTE — Patient Instructions (Addendum)
 It was nice to see you again today.  I do not believe we need to add any more testing from my end of things.  Please use your CPAP machine consistently and take all your medications as prescribed. Follow-up with Amy Lomax, NP as scheduled for January 2025.

## 2023-08-13 NOTE — Progress Notes (Signed)
 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 S/p RT to right lung, rib, and brain under the care of Dr. Shannon (final RT 7/23). Patient is planning palliative systemic chemoimmunotherapy with carboplatin/alimta + keytruda q21d.   Past medical history includes HTN, chronic a-fib, CAD, hemorrhoids, CVA, GERD, PUD, GIB, hypothyroidism, DM2, HLD, anxiety  8/4 - chemo start held secondary to thrombocytopenia   Met with patient and son in office. Patient reports appetite is too good. Endorses 30 lb wt loss after learning of diagnosis secondary to worry/depression. Patient states she has gained all of that back. She was drinking glucerna when appetite was low. Patient asking if she should restart this. Patient with lower extremity edema which is improving since restarting diuretics. Patient is drinking 64 ounces of water . She denies nausea, vomiting, diarrhea. Patient takes linzess  for chronic constipation. Having 2-3 bowel movements daily.    Nutrition Focused Physical Exam: deferred    Medications: decadron , dexilant , diadylt er, folic acid , glimepiride, klor-con , linzess , ativan , methylprednisolone , beet root, benicar , zofran , zofran -odt, compazine , miralax , xarelto , crestor , aldactone , synthroid , tramadol    Labs: 8/4 - glucose 171   Anthropometrics:   Height: 5'7 Weight: 259 lb (8/7) - BLE edema present on aldactone   UBW: 276 lb (per pt prior to diagnosis) BMI: 40.57   NUTRITION DIAGNOSIS: Food and nutrition related knowledge deficit related to cancer and associated treatment as evidenced by no prior need for associated nutrition information    INTERVENTION:  Educated on importance of adequate calorie and protein energy intake to maintain strength/weights during treatment Encourage small frequent meals with good sources of protein  - handout with ideas provided Discussed ONS as a tool to aid with wt maintenance as needed - samples of Ensure, CIB powder + coupons provided  Discussed sugar and cancer - handout provided  Contact information given    MONITORING, EVALUATION, GOAL: Pt will tolerate adequate calories and protein to preserve LBM   Next Visit: Wednesday September 3 during infusion

## 2023-08-13 NOTE — Progress Notes (Signed)
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 Oil-Flaxseed Oil (OREGANO OIL IMMUNE SUPPORT) 50-25 MG CAPS Take 1 Dose by mouth daily. (Patient not taking: Reported on 08/13/2023)   OVER THE COUNTER MEDICATION Take 1 capsule by mouth daily. SAFFRON (Patient not taking: Reported on 08/13/2023)   prochlorperazine  (COMPAZINE ) 10 MG tablet Take 1 tablet (10 mg total) by mouth every 6 (six) hours as needed. (Patient not taking: Reported on 08/13/2023)   promethazine -dextromethorphan  (PROMETHAZINE -DM) 6.25-15 MG/5ML syrup Take 5 mLs by mouth 4 (four) times daily as needed. (Patient not taking: Reported on 08/13/2023)   traMADol  (ULTRAM ) 50 MG tablet Take 25 mg by mouth every 6 (six) hours as needed for moderate pain (pain score 4-6). (Patient not taking: Reported on 08/13/2023)   vitamin E 180 MG (400 UNITS) capsule Take 400 Units by mouth daily. (Patient not taking: Reported on 08/13/2023)   No facility-administered encounter medications on file as of 08/13/2023.  :  Review of Systems:  Out of a complete 14 point review of systems, all are reviewed and negative with the exception of these symptoms as listed below:  Review of Systems  Neurological:        Patient is here with her daughter Luke for stroke  follow-up. She states she is doing ok but the stroke has slowed her down. She has noticed lingering symptoms with trouble writing, shaking of hand, some difficulty with walking. They report she can still do everything she needs to do but at a slower rate. Pt states it takes her a little while to remember things sometimes. ESS 4    Objective:  Neurological Exam  Physical Exam Physical Examination:   Vitals:   08/13/23 1018  BP: 135/76  Pulse: 89    General Examination: The patient is a very pleasant 63 y.o. female in no acute distress. She appears deconditioned.    HEENT: Normocephalic, atraumatic, pupils are equal, round and reactive to light, extraocular tracking is fair.    Corrective eyeglasses in place.  Hearing is grossly intact. Face is symmetric with normal facial animation and normal facial sensation to light touch. Speech is slightly slow with no dysarthria noted. There is no hypophonia. There is no lip, neck/head, jaw or voice tremor. Neck is supple with full range of passive and active motion. There are no carotid bruits on auscultation. Oropharynx exam reveals: Moderate mouth dryness, adequate dental hygiene and moderate airway crowding, tongue protrudes centrally and palate elevates symmetrically.   Chest: Clear to auscultation without wheezing, rhonchi or crackles noted. Unremarkable port area.   Heart: S1+S2+0, irreg. irregular.     Abdomen: Soft, non-tender and  non-distended with normal bowel sounds appreciated on auscultation.   Extremities: There is trace pitting edema in the lower extremities bilaterally.    Skin: Warm and dry without trophic changes noted.    Musculoskeletal: exam reveals status post knee surgeries.  Right knee swelling noted.      Neurologically:  Mental status: The patient is awake, alert and oriented in all 4 spheres. Her immediate and remote memory, attention, language skills and fund of knowledge are appropriate. There is no evidence of  aphasia, agnosia, apraxia or anomia. Speech is clear with normal prosody and enunciation. Thought process is linear. Mood is normal and affect is normal.  Cranial nerves II - XII are as described above under HEENT exam.  Motor exam: Normal bulk, global strength at least 4 out of 5, slightly weaker left elbow extension.  Tone normal.  There is no resting, postural or action tremor.  Romberg is not tested for safety concerns.  Fine motor skills and coordination: grossly intact.  Cerebellar testing: No dysmetria or intention tremor. There is no truncal or gait ataxia.  Sensory exam: intact to light touch.    Gait, station and balance: She stands slowly and walks by holding onto her daughter.  She walks slowly and cautiously.  No walking aid.   Assessment and Plan:    In summary, Becky [name] is a very pleasant 63 year old female with an underlying complex medical history of coronary artery disease, atrial fibrillation, embolic strokes in April 2025, recent diagnosis of stage IV lung cancer, with status post XRT, planned chemotherapy, history of lower GI bleed, back pain, arthritis, status post right total knee replacement, diabetes, reflux disease, hypertension, hyperlipidemia, hypothyroidism, anemia, sleep apnea, and obesity, who presents for follow-up after her recent hospitalization for embolic strokes.  She had full stroke workup in the hospital.  She is back on Xarelto  and is also on a baby aspirin .  She is currently on a steroid Dosepak and has been on Decadron  as well before.  She is going to have chemotherapy soon as soon as her platelets are higher.  She has received her new CPAP machine recently and has been compliant with it.  I saw the latest compliance data on her remote download.  She is commended for her treatment adherence.  We talked about her recent hospitalization.  We talked about the importance of secondary stroke prevention and maintaining a healthy lifestyle, following up with all  appointments which are obviously primarily surrounded around her cancer treatment at this time.  She is advised to follow-up routinely to see Greig Forbes, NP in January for her already scheduled appointment.  I answered all the questions today and the patient and her daughter were in agreement.   I spent 40 minutes in total face-to-face time and in reviewing records during pre-charting, more than 50% of which was spent in counseling and coordination of care, reviewing test results, reviewing medications and treatment regimen and/or in discussing or reviewing the diagnosis of embolic stroke, OSA, the prognosis and treatment options. Pertinent laboratory and imaging test results that were available during this visit with the patient were reviewed by me and considered in my medical decision making (see chart for details).
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
## 2023-08-14 ENCOUNTER — Other Ambulatory Visit: Payer: Self-pay

## 2023-08-14 ENCOUNTER — Ambulatory Visit: Payer: Self-pay

## 2023-08-14 ENCOUNTER — Telehealth: Payer: Self-pay

## 2023-08-14 NOTE — Telephone Encounter (Signed)
 Received telephone call from the patient c/o of small pain-less bumps inside of her mouth in the jaw. Patient denies having any white patches on her tongue or the roof of her mouth. Patient denies any difficulty breathing, chewing, or swallowing. Let patient know her provider's are not in the office today. Spoke with Anders, RN. The call dropped. I attempted to contact the patient, twice. Unable to reach the patient. Left a detailed message on the patient's voicemail stating, per the RN she can try to gargle warm salt water  or warm water  w/ baking soda 3-4x daily for relief, and to contact Dr. Clifm office on Monday If the sx worsen or no improvement.

## 2023-08-17 ENCOUNTER — Ambulatory Visit: Payer: Self-pay

## 2023-08-17 ENCOUNTER — Other Ambulatory Visit

## 2023-08-17 ENCOUNTER — Ambulatory Visit

## 2023-08-17 ENCOUNTER — Inpatient Hospital Stay

## 2023-08-17 ENCOUNTER — Inpatient Hospital Stay: Admitting: Nurse Practitioner

## 2023-08-17 ENCOUNTER — Telehealth: Payer: Self-pay

## 2023-08-17 VITALS — BP 140/78 | HR 91 | Temp 97.7°F | Resp 17 | Wt 255.7 lb

## 2023-08-17 DIAGNOSIS — Z95828 Presence of other vascular implants and grafts: Secondary | ICD-10-CM

## 2023-08-17 DIAGNOSIS — C3491 Malignant neoplasm of unspecified part of right bronchus or lung: Secondary | ICD-10-CM

## 2023-08-17 DIAGNOSIS — Z5112 Encounter for antineoplastic immunotherapy: Secondary | ICD-10-CM | POA: Diagnosis not present

## 2023-08-17 LAB — CMP (CANCER CENTER ONLY)
ALT: 28 U/L (ref 0–44)
AST: 14 U/L — ABNORMAL LOW (ref 15–41)
Albumin: 3.9 g/dL (ref 3.5–5.0)
Alkaline Phosphatase: 73 U/L (ref 38–126)
Anion gap: 6 (ref 5–15)
BUN: 20 mg/dL (ref 8–23)
CO2: 27 mmol/L (ref 22–32)
Calcium: 9.3 mg/dL (ref 8.9–10.3)
Chloride: 104 mmol/L (ref 98–111)
Creatinine: 0.95 mg/dL (ref 0.44–1.00)
GFR, Estimated: >60 mL/min (ref >60)
Glucose, Bld: 227 mg/dL — ABNORMAL HIGH (ref 70–99)
Potassium: 4.2 mmol/L (ref 3.5–5.1)
Sodium: 137 mmol/L (ref 135–145)
Total Bilirubin: 0.7 mg/dL (ref 0.0–1.2)
Total Protein: 6 g/dL — ABNORMAL LOW (ref 6.5–8.1)

## 2023-08-17 LAB — CBC WITH DIFFERENTIAL (CANCER CENTER ONLY)
Abs Immature Granulocytes: 0.14 K/uL — ABNORMAL HIGH (ref 0.00–0.07)
Basophils Absolute: 0 K/uL (ref 0.0–0.1)
Basophils Relative: 0 %
Eosinophils Absolute: 0 K/uL (ref 0.0–0.5)
Eosinophils Relative: 1 %
HCT: 34.7 % — ABNORMAL LOW (ref 36.0–46.0)
Hemoglobin: 11.7 g/dL — ABNORMAL LOW (ref 12.0–15.0)
Immature Granulocytes: 2 %
Lymphocytes Relative: 6 %
Lymphs Abs: 0.4 K/uL — ABNORMAL LOW (ref 0.7–4.0)
MCH: 30.8 pg (ref 26.0–34.0)
MCHC: 33.7 g/dL (ref 30.0–36.0)
MCV: 91.3 fL (ref 80.0–100.0)
Monocytes Absolute: 0.5 K/uL (ref 0.1–1.0)
Monocytes Relative: 8 %
Neutro Abs: 5.6 K/uL (ref 1.7–7.7)
Neutrophils Relative %: 83 %
Platelet Count: 47 K/uL — ABNORMAL LOW (ref 150–400)
RBC: 3.8 MIL/uL — ABNORMAL LOW (ref 3.87–5.11)
RDW: 15.6 % — ABNORMAL HIGH (ref 11.5–15.5)
WBC Count: 6.7 K/uL (ref 4.0–10.5)
nRBC: 0 % (ref 0.0–0.2)

## 2023-08-17 LAB — TSH: TSH: 1.13 u[IU]/mL (ref 0.350–4.500)

## 2023-08-17 MED ORDER — SODIUM CHLORIDE 0.9% FLUSH
10.0000 mL | Freq: Once | INTRAVENOUS | Status: AC
Start: 2023-08-17 — End: 2023-08-17
  Administered 2023-08-17 (×2): 10 mL

## 2023-08-17 NOTE — Telephone Encounter (Signed)
 Called patient to make her aware of her appointment tomorrow at [facility] at 8 am 8/12 and Wednesday 8/13 at 8 am as well. Patient voiced full understanding and had no further questions.

## 2023-08-18 ENCOUNTER — Inpatient Hospital Stay

## 2023-08-18 ENCOUNTER — Inpatient Hospital Stay: Attending: Nurse Practitioner

## 2023-08-18 ENCOUNTER — Ambulatory Visit: Payer: Self-pay

## 2023-08-18 VITALS — BP 94/51 | HR 78 | Temp 98.2°F | Resp 16 | Wt 255.3 lb

## 2023-08-18 DIAGNOSIS — D693 Immune thrombocytopenic purpura: Secondary | ICD-10-CM | POA: Insufficient documentation

## 2023-08-18 DIAGNOSIS — Z95828 Presence of other vascular implants and grafts: Secondary | ICD-10-CM

## 2023-08-18 LAB — T4: T4, Total: 6.3 ug/dL (ref 4.5–12.0)

## 2023-08-18 MED ORDER — SODIUM CHLORIDE 0.9% FLUSH
10.0000 mL | Freq: Once | INTRAVENOUS | Status: AC | PRN
  Administered 2023-08-18 (×2): 10 mL

## 2023-08-18 MED ORDER — DIPHENHYDRAMINE HCL 25 MG PO CAPS
25.0000 mg | ORAL_CAPSULE | Freq: Once | ORAL | Status: AC
  Administered 2023-08-18 (×2): 25 mg via ORAL
  Filled 2023-08-18: qty 1

## 2023-08-18 MED ORDER — IMMUNE GLOBULIN (HUMAN) 10 GM/100ML IV SOLN
1.0000 g/kg | Freq: Once | INTRAVENOUS | Status: AC
  Administered 2023-08-18 (×2): 120 g via INTRAVENOUS
  Filled 2023-08-18: qty 1200

## 2023-08-18 MED ORDER — DEXTROSE 5 % IV SOLN: INTRAVENOUS | Status: DC

## 2023-08-18 MED ORDER — ACETAMINOPHEN 325 MG PO TABS
650.0000 mg | ORAL_TABLET | Freq: Once | ORAL | Status: AC
  Administered 2023-08-18 (×2): 650 mg via ORAL
  Filled 2023-08-18: qty 2

## 2023-08-18 NOTE — Patient Instructions (Signed)
 IMMUNE GLOBULIN  (im MUNE GLOB yoo lin) treats many immune system conditions. It works by Designer, multimedia extra antibodies. Antibodies are proteins made by the immune system that help protect the body. This medicine may be used for other purposes; ask your health care provider or pharmacist if you have questions. COMMON BRAND NAME(S): ASCENIV, Baygam, BIVIGAM, Carimune, Carimune NF, cutaquig, Cuvitru, Flebogamma, Flebogamma DIF, GamaSTAN, GamaSTAN S/D, Gamimune N, Gammagard, Gammagard S/D, Gammaked, Gammaplex, Gammar-P IV, Gamunex, Gamunex-C, Hizentra, Iveegam, Iveegam EN, Octagam, Panglobulin, Panglobulin NF, panzyga, Polygam S/D, Privigen , Sandoglobulin, Venoglobulin-S, Vigam, Vivaglobulin, Xembify What should I tell my care team before I take this medication? They need to know if you have any of these conditions: Blood clotting disorder Condition where you have excess fluid in your body, such as heart failure or edema Dehydration Diabetes Have had blood clots Heart disease Immune system conditions Kidney disease Low levels of IgA Recent or upcoming vaccine An unusual or allergic reaction to immune globulin , other medications, foods, dyes, or preservatives Pregnant or trying to get pregnant Breastfeeding How should I use this medication? This medication is infused into a vein or under the skin. It is usually given by your care team in a hospital or clinic setting. It may also be given at home. If you get this medication at home, you will be taught how to prepare and give it. Use exactly as directed. Take it as directed on the prescription label at the same time every day. Keep taking it unless your care team tells you to stop. It is important that you put your used needles and syringes in a special sharps container. Do not put them in a trash can. If you do not have a sharps container, call your pharmacist or care team to get one. Talk to your care team about the use of this medication in  children. While it may be given to children for selected conditions, precautions do apply. Overdosage: If you think you have taken too much of this medicine contact a poison control center or emergency room at once. NOTE: This medicine is only for you. Do not share this medicine with others. What if I miss a dose? If you get this medication at the hospital or clinic: It is important not to miss your dose. Call your care team if you are unable to keep an appointment. If you give yourself this medication at home: If you miss a dose, take it as soon as you can. Then continue your normal schedule. If it is almost time for your next dose, take only that dose. Do not take double or extra doses. Call your care team with questions. What may interact with this medication? Live virus vaccines This list may not describe all possible interactions. Give your health care provider a list of all the medicines, herbs, non-prescription drugs, or dietary supplements you use. Also tell them if you smoke, drink alcohol, or use illegal drugs. Some items may interact with your medicine. What should I watch for while using this medication? Your condition will be monitored carefully while you are receiving this medication. Tell your care team if your symptoms do not start to get better or if they get worse. You may need blood work done while you are taking this medication. This medication increases the risk of blood clots. People with heart, blood vessel, or blood clotting conditions are more likely to develop a blood clot. Other risk factors include advanced age, estrogen use, tobacco use, lack of movement, and  being overweight. This medication can decrease the response to a vaccine. If you need to get vaccinated, tell your care team if you have received this medication within the last year. Extra booster doses may be needed. Talk to your care team to see if a different vaccination schedule is needed. If you have diabetes,  you may get a falsely elevated blood sugar reading. Talk to your care team about how to check your blood sugar while taking this medication. What side effects may I notice from receiving this medication? Side effects that you should report to your care team as soon as possible: Allergic reactions--skin rash, itching, hives, swelling of the face, lips, tongue, or throat Blood clot--pain, swelling, or warmth in the leg, shortness of breath, chest pain Fever, neck pain or stiffness, sensitivity to light, headache, nausea, vomiting, confusion, which may be signs of meningitis Hemolytic anemia--unusual weakness or fatigue, dizziness, headache, trouble breathing, dark urine, yellowing skin or eyes Kidney injury--decrease in the amount of urine, swelling of the ankles, hands, or feet Low sodium level--muscle weakness, fatigue, dizziness, headache, confusion Shortness of breath or trouble breathing, cough, unusual weakness or fatigue, blue skin or lips Side effects that usually do not require medical attention (report these to your care team if they continue or are bothersome): Chills Diarrhea Fever Headache Nausea This list may not describe all possible side effects. Call your doctor for medical advice about side effects. You may report side effects to FDA at 1-800-FDA-1088. Where should I keep my medication? Keep out of the reach of children and pets. You will be instructed on how to store this medication. Get rid of any unused medication after the expiration date. To get rid of medications that are no longer needed or have expired: Take the medication to a medication take-back program. Check with your pharmacy or law enforcement to find a location. If you cannot return the medication, ask your pharmacist or care team how to get rid of this medication safely. NOTE: This sheet is a summary. It may not cover all possible information. If you have questions about this medicine, talk to your doctor,  pharmacist, or health care provider.  2024 Elsevier/Gold Standard (2022-12-05 00:00:00)  Implanted [facility] Guide An implanted port is a device that is placed under the skin. It is usually placed in the chest. The device may vary based on the need. Implanted ports can be used to give IV medicine, to take blood, or to give fluids. You may have an implanted port if: You need IV medicine that would be irritating to the small veins in your hands or arms. You need IV medicines, such as chemotherapy, for a long period of time. You need IV nutrition for a long period of time. You may have fewer limitations when using a port than you would if you used other types of long-term IVs. You will also likely be able to return to normal activities after your incision heals. An implanted port has two main parts: Reservoir. The reservoir is the part where a needle is inserted to give medicines or draw blood. The reservoir is round. After the port is placed, it appears as a small, raised area under your skin. Catheter. The catheter is a small, thin tube that connects the reservoir to a vein. Medicine that is inserted into the reservoir goes into the catheter and then into the vein. How is my port accessed? To access your port: A numbing cream may be placed on the skin over  the port site. Your health care provider will put on a mask and sterile gloves. The skin over your port will be cleaned carefully with a germ-killing soap and allowed to dry. Your health care provider will gently pinch the port and insert a needle into it. Your health care provider will check for a blood return to make sure the port is in the vein and is still working (patent). If your port needs to remain accessed to get medicine continuously (constant infusion), your health care provider will place a clear bandage (dressing) over the needle site. The dressing and needle will need to be changed every week, or as told by your health care  provider. What is flushing? Flushing helps keep the port working. Follow instructions from your health care provider about how and when to flush the port. Ports are usually flushed with saline solution or a medicine called heparin . The need for flushing will depend on how the port is used: If the port is only used from time to time to give medicines or draw blood, the port may need to be flushed: Before and after medicines have been given. Before and after blood has been drawn. As part of routine maintenance. Flushing may be recommended every 4-6 weeks. If a constant infusion is running, the port may not need to be flushed. Throw away any syringes in a disposal container that is meant for sharp items (sharps container). You can buy a sharps container from a pharmacy, or you can make one by using an empty hard plastic bottle with a cover. How long will my port stay implanted? The port can stay in for as long as your health care provider thinks it is needed. When it is time for the port to come out, a surgery will be done to remove it. The surgery will be similar to the procedure that was done to put the port in. Follow these instructions at home: Caring for your port and port site Flush your port as told by your health care provider. If you need an infusion over several days, follow instructions from your health care provider about how to take care of your port site. Make sure you: Change your dressing as told by your health care provider. Wash your hands with soap and water  for at least 20 seconds before and after you change your dressing. If soap and water  are not available, use alcohol-based hand sanitizer. Place any used dressings or infusion bags into a plastic bag. Throw that bag in the trash. Keep the dressing that covers the needle clean and dry. Do not get it wet. Do not use scissors or sharp objects near the infusion tubing. Keep any external tubes clamped, unless they are being  used. Check your port site every day for signs of infection. Check for: Redness, swelling, or pain. Fluid or blood. Warmth. Pus or a bad smell. Protect the skin around the port site. Avoid wearing bra straps that rub or irritate the site. Protect the skin around your port from seat belts. Place a soft pad over your chest if needed. Bathe or shower as told by your health care provider. The site may get wet as long as you are not actively receiving an infusion. General instructions  Return to your normal activities as told by your health care provider. Ask your health care provider what activities are safe for you. Carry a medical alert card or wear a medical alert bracelet at all times. This will let health care  providers know that you have an implanted port in case of an emergency. Where to find more information American Cancer Society: www.cancer.org American Society of Clinical Oncology: www.cancer.net Contact a health care provider if: You have a fever or chills. You have redness, swelling, or pain at the port site. You have fluid or blood coming from your port site. Your incision feels warm to the touch. You have pus or a bad smell coming from the port site. Summary Implanted ports are usually placed in the chest for long-term IV access. Follow instructions from your health care provider about flushing the port and changing bandages (dressings). Take care of the area around your port by avoiding clothing that puts pressure on the area, and by watching for signs of infection. Protect the skin around your port from seat belts. Place a soft pad over your chest if needed. Contact a health care provider if you have a fever or you have redness, swelling, pain, fluid, or a bad smell at the port site. This information is not intended to replace advice given to you by your health care provider. Make sure you discuss any questions you have with your health care provider. Document Revised:  06/26/2020 Document Reviewed: 06/26/2020 Elsevier Patient Education  2024 [employer].

## 2023-08-18 NOTE — Progress Notes (Signed)
 At 1040 a.m., pt's B/P  96/34 (machine) and 96/50 (manual, starting B/P was 103/59. She is supine and resting. Easily arousable and denies any symptoms. Notified provider (Lacie B, NP) and ok to continue titration as scheduled.

## 2023-08-19 ENCOUNTER — Ambulatory Visit: Payer: Self-pay

## 2023-08-19 ENCOUNTER — Inpatient Hospital Stay

## 2023-08-19 VITALS — BP 146/75 | HR 96 | Temp 98.7°F | Resp 18 | Wt 247.2 lb

## 2023-08-19 DIAGNOSIS — R519 Headache, unspecified: Secondary | ICD-10-CM

## 2023-08-19 DIAGNOSIS — Z95828 Presence of other vascular implants and grafts: Secondary | ICD-10-CM

## 2023-08-19 DIAGNOSIS — D693 Immune thrombocytopenic purpura: Secondary | ICD-10-CM | POA: Diagnosis not present

## 2023-08-19 MED ORDER — ACETAMINOPHEN 325 MG PO TABS
650.0000 mg | ORAL_TABLET | Freq: Once | ORAL | Status: AC
  Administered 2023-08-19 (×2): 325 mg via ORAL
  Filled 2023-08-19: qty 2

## 2023-08-19 MED ORDER — ACETAMINOPHEN 325 MG PO TABS
650.0000 mg | ORAL_TABLET | Freq: Once | ORAL | Status: AC
  Administered 2023-08-19 (×2): 650 mg via ORAL
  Filled 2023-08-19: qty 2

## 2023-08-19 MED ORDER — DEXTROSE 5 % IV SOLN
INTRAVENOUS | Status: DC
Start: 2023-08-19 — End: 2023-08-19

## 2023-08-19 MED ORDER — IMMUNE GLOBULIN (HUMAN) 10 GM/100ML IV SOLN
1.0000 g/kg | Freq: Once | INTRAVENOUS | Status: AC
  Administered 2023-08-19 (×2): 120 g via INTRAVENOUS
  Filled 2023-08-19 (×3): qty 1200

## 2023-08-19 MED ORDER — DIPHENHYDRAMINE HCL 25 MG PO CAPS
25.0000 mg | ORAL_CAPSULE | Freq: Once | ORAL | Status: AC
  Administered 2023-08-19 (×2): 25 mg via ORAL
  Filled 2023-08-19: qty 1

## 2023-08-19 NOTE — Progress Notes (Signed)
 Called to infusion to see Becky [name]. S/p D1 IVIG 1g/kg on 8/12. Tolerated infusion well. At home she developed a headache and mild nausea. This morning she returns for D2, feeling weak/tired. Had fever and chills at home Tmax 101, took tylenol  and is now afebrile. Eating/drinking and urinating well. Denies bowel changes. Denies cough/chest pain or changes from her baseline respiratory status.   Exam is benign. Port covered with cream but no surrounding edema/erythema.   I suspect these are all side effects of IVIG rather than reaction or acute infection. I recommend to proceed with D2 IVIG with tylenol /benadryl  premeds. She will be monitored closely. She knows to alert nursing to any changes during the infusion and I will check on her later. Pt and family agree with the plan.   1418: went back to re-assess patient, sleeping. Has had calf cramps and intermittent headaches today. Given tylenol  with partial relief. Takes K at home for cramps which she can do later today, no need to give in clinic. Can take regular headache meds at home and can also take additional tylenol  if she has recurrent fever from IVIG. Asked RN to relay info and to have pt call clinic with any other needs/concerns.   Becky Reidinger, NP

## 2023-08-19 NOTE — Patient Instructions (Signed)
 Immune Globulin  Injection What is this medication? IMMUNE GLOBULIN  (im MUNE GLOB yoo lin) treats many immune system conditions. It works by Designer, multimedia extra antibodies. Antibodies are proteins made by the immune system that help protect the body. This medicine may be used for other purposes; ask your health care provider or pharmacist if you have questions. COMMON BRAND NAME(S): ASCENIV, Baygam, BIVIGAM, Carimune, Carimune NF, cutaquig, Cuvitru, Flebogamma, Flebogamma DIF, GamaSTAN, GamaSTAN S/D, Gamimune N, Gammagard, Gammagard S/D, Gammaked, Gammaplex, Gammar-P IV, Gamunex, Gamunex-C, Hizentra, Iveegam, Iveegam EN, Octagam, Panglobulin, Panglobulin NF, panzyga, Polygam S/D, Privigen , Sandoglobulin, Venoglobulin-S, Vigam, Vivaglobulin, Xembify What should I tell my care team before I take this medication? They need to know if you have any of these conditions: Blood clotting disorder Condition where you have excess fluid in your body, such as heart failure or edema Dehydration Diabetes Have had blood clots Heart disease Immune system conditions Kidney disease Low levels of IgA Recent or upcoming vaccine An unusual or allergic reaction to immune globulin , other medications, foods, dyes, or preservatives Pregnant or trying to get pregnant Breastfeeding How should I use this medication? This medication is infused into a vein or under the skin. It is usually given by your care team in a hospital or clinic setting. It may also be given at home. If you get this medication at home, you will be taught how to prepare and give it. Use exactly as directed. Take it as directed on the prescription label at the same time every day. Keep taking it unless your care team tells you to stop. It is important that you put your used needles and syringes in a special sharps container. Do not put them in a trash can. If you do not have a sharps container, call your pharmacist or care team to get one. Talk to  your care team about the use of this medication in children. While it may be given to children for selected conditions, precautions do apply. Overdosage: If you think you have taken too much of this medicine contact a poison control center or emergency room at once. NOTE: This medicine is only for you. Do not share this medicine with others. What if I miss a dose? If you get this medication at the hospital or clinic: It is important not to miss your dose. Call your care team if you are unable to keep an appointment. If you give yourself this medication at home: If you miss a dose, take it as soon as you can. Then continue your normal schedule. If it is almost time for your next dose, take only that dose. Do not take double or extra doses. Call your care team with questions. What may interact with this medication? Live virus vaccines This list may not describe all possible interactions. Give your health care provider a list of all the medicines, herbs, non-prescription drugs, or dietary supplements you use. Also tell them if you smoke, drink alcohol, or use illegal drugs. Some items may interact with your medicine. What should I watch for while using this medication? Your condition will be monitored carefully while you are receiving this medication. Tell your care team if your symptoms do not start to get better or if they get worse. You may need blood work done while you are taking this medication. This medication increases the risk of blood clots. People with heart, blood vessel, or blood clotting conditions are more likely to develop a blood clot. Other risk factors include advanced age, estrogen  use, tobacco use, lack of movement, and being overweight. This medication can decrease the response to a vaccine. If you need to get vaccinated, tell your care team if you have received this medication within the last year. Extra booster doses may be needed. Talk to your care team to see if a different  vaccination schedule is needed. If you have diabetes, you may get a falsely elevated blood sugar reading. Talk to your care team about how to check your blood sugar while taking this medication. What side effects may I notice from receiving this medication? Side effects that you should report to your care team as soon as possible: Allergic reactions--skin rash, itching, hives, swelling of the face, lips, tongue, or throat Blood clot--pain, swelling, or warmth in the leg, shortness of breath, chest pain Fever, neck pain or stiffness, sensitivity to light, headache, nausea, vomiting, confusion, which may be signs of meningitis Hemolytic anemia--unusual weakness or fatigue, dizziness, headache, trouble breathing, dark urine, yellowing skin or eyes Kidney injury--decrease in the amount of urine, swelling of the ankles, hands, or feet Low sodium level--muscle weakness, fatigue, dizziness, headache, confusion Shortness of breath or trouble breathing, cough, unusual weakness or fatigue, blue skin or lips Side effects that usually do not require medical attention (report these to your care team if they continue or are bothersome): Chills Diarrhea Fever Headache Nausea This list may not describe all possible side effects. Call your doctor for medical advice about side effects. You may report side effects to FDA at 1-800-FDA-1088. Where should I keep my medication? Keep out of the reach of children and pets. You will be instructed on how to store this medication. Get rid of any unused medication after the expiration date. To get rid of medications that are no longer needed or have expired: Take the medication to a medication take-back program. Check with your pharmacy or law enforcement to find a location. If you cannot return the medication, ask your pharmacist or care team how to get rid of this medication safely. NOTE: This sheet is a summary. It may not cover all possible information. If you have  questions about this medicine, talk to your doctor, pharmacist, or health care provider.  2024 Elsevier/Gold Standard (2022-12-05 00:00:00)

## 2023-08-19 NOTE — Progress Notes (Signed)
 Pt was discharged by another RN while I was attending to another patient.

## 2023-08-19 NOTE — Telephone Encounter (Signed)
 Phoned the pt and left a message for her to return call regarding her Dexilant .

## 2023-08-19 NOTE — Progress Notes (Signed)
 Pt presented for her second IVIG infusion day. She tolerated 08/18/23 infusion (while here) without any adverse effects. She reports fever of 101 degrees orally last night at home accompanied by chills and a headache.Took Tylenol  early this morning with some improvement. Lacie Burton, NP notified and she is coming to see the pt in infusion center. See her note. Pt may proceed with IVIG today.  1305: Pt up to restroom, minor leg cramps from sitting for a prolonged period of time, better with walking. C/O headache again and requests Tylenol . Order obtained from Lacie Burton, NP for 325mg  po to start with.  Updated provider that pt's headache slightly better. 1400: Pt still rating headache as an 8/10.  1415- Pt sleeping. Lacie Burton in to see patient. She recommends pt take her home meds when she gets home today and can use Tylenol  or whatever she normally takes for a headache at home.   The 325mg  of Tylenol  that was not given was returned to the [facility].

## 2023-08-19 NOTE — Telephone Encounter (Signed)
 Pt approved for Dexlansoprazole  DR 60 mg cap from 08/17/2023-08/16/2024. The box was checked for brand name only but this is what came back. I will call the pt and the pt's insurance company today.

## 2023-08-20 ENCOUNTER — Ambulatory Visit: Payer: Self-pay

## 2023-08-20 ENCOUNTER — Ambulatory Visit: Admitting: Internal Medicine

## 2023-08-20 ENCOUNTER — Other Ambulatory Visit

## 2023-08-21 ENCOUNTER — Ambulatory Visit: Payer: Self-pay

## 2023-08-21 ENCOUNTER — Telehealth: Payer: Self-pay | Admitting: Medical Oncology

## 2023-08-21 NOTE — Telephone Encounter (Signed)
 Nurse's Note: Patient reports nosebleeds occurring once yesterday and once today; both episodes were brief in duration. Patient noted the presence of what appeared to be a small clot in the tissue during today's episode.  Patient also reports constipation; last bowel movement was 4 days ago. Plan in place for patient to obtain Magnesium  Citrate and begin with half a bottle.  Patient was advised to monitor for further nosebleeds and to contact provider if any episode lasts longer than 5 minutes. Bleeding precautions and emergency department precautions were reviewed with the patient. Patient voiced understanding.

## 2023-08-22 NOTE — Progress Notes (Unsigned)
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 old  African-American female with stage IV (T2a, N2, M1 c) non-small cell lung cancer, adenocarcinoma presented with right upper lobe lung mass in addition to right hilar and mediastinal lymphadenopathy as well as metastatic disease to the sixth rib, metastatic disease to the brain, and left adrenal gland metastasis. This was diagnosed and May 2025. Her molecular studies show TMB high and MET over-expression which can be used in the second line setting.      The original plan was to undergo concurrent chemoradiation with weekly carboplatin for an AUC of 2 and paclitaxel 45 mg/m the first dose was expected on 07/13/2023 but the patient presented to the emergency room with a CVA and was found to have metastatic disease to the brain.   She completed radiation to the right rib, right lung bronchus, and brain under the care of Dr. Shannon on 07/29/23.   She was supposed to undergo cycle #1 on 08/10/23 but was found to have thrombocytopenia. 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 The most recent dose on 08/19/23   She is expected to start treatment with systemic chemotherapy with carboplatin for an AUC of 5, alimta 500 mg/m2, and keytruda 200 mg IV  every 3 weeks.   Her infusion is not scheduled until 9/2. I reviewed her labs with Dr. Sherrod. Her platelet count has recovered. I reviewed her doses of her chemo with Dr. Sherrod to see if dose reduction is needed. He would like to keep her doses the same (carbo AUC 5 and Alimta 500 mg/m2).    We will try to schedule her first chemo early next week on 08/31/23. Would recommend labs before to closely monitor the platelet count. We will monitor her labs on a weekly basis. I did let the patient know should she ever develop any concerning bleeding or bruising to contact us  for lab appointment.   We will see her back in 1 week for toxicity chek and repeat labs.   She will continue her decadron  tapers.   If she ever needs IVIG in the future, she tolerated this better with premedications with benadryl .   The patient was advised to call immediately if she has any concerning symptoms in the interval. The patient voices understanding of current disease status and treatment options and is in agreement with the current care plan. All questions were answered. The patient knows to call the clinic with any problems, questions or concerns. We can certainly see the patient much sooner if necessary        No orders of the defined types were placed in this encounter.    The total time spent in the appointment was 30-39 minutes  Emmanuelle Hibbitts L Daniyla Pfahler, PA-C 08/22/23

## 2023-08-24 ENCOUNTER — Ambulatory Visit: Payer: Self-pay

## 2023-08-24 ENCOUNTER — Other Ambulatory Visit: Payer: Self-pay | Admitting: Cardiology

## 2023-08-24 DIAGNOSIS — I480 Paroxysmal atrial fibrillation: Secondary | ICD-10-CM

## 2023-08-24 MED ORDER — RIVAROXABAN 20 MG PO TABS
20.0000 mg | ORAL_TABLET | Freq: Every day | ORAL | 1 refills | Status: AC
Start: 2023-08-24 — End: ?

## 2023-08-24 NOTE — Telephone Encounter (Signed)
 Prescription refill request for Xarelto  received.  Indication:afib Last office visit:7/25 Weight:112.1  kg Age:63 Scr:0.95  8/25 CrCl:108.66  ml/min  Prescription refilled

## 2023-08-24 NOTE — Telephone Encounter (Signed)
 Refill Request.

## 2023-08-25 ENCOUNTER — Inpatient Hospital Stay

## 2023-08-25 ENCOUNTER — Ambulatory Visit: Payer: Self-pay

## 2023-08-26 ENCOUNTER — Inpatient Hospital Stay

## 2023-08-26 ENCOUNTER — Inpatient Hospital Stay (HOSPITAL_BASED_OUTPATIENT_CLINIC_OR_DEPARTMENT_OTHER): Admitting: Physician Assistant

## 2023-08-26 ENCOUNTER — Telehealth: Payer: Self-pay | Admitting: Internal Medicine

## 2023-08-26 VITALS — BP 124/85 | HR 87 | Temp 97.4°F | Resp 14 | Wt 251.7 lb

## 2023-08-26 DIAGNOSIS — C3490 Malignant neoplasm of unspecified part of unspecified bronchus or lung: Secondary | ICD-10-CM

## 2023-08-26 DIAGNOSIS — C7931 Secondary malignant neoplasm of brain: Secondary | ICD-10-CM | POA: Diagnosis not present

## 2023-08-26 DIAGNOSIS — C3491 Malignant neoplasm of unspecified part of right bronchus or lung: Secondary | ICD-10-CM

## 2023-08-26 DIAGNOSIS — Z95828 Presence of other vascular implants and grafts: Secondary | ICD-10-CM

## 2023-08-26 DIAGNOSIS — D61818 Other pancytopenia: Secondary | ICD-10-CM | POA: Insufficient documentation

## 2023-08-26 DIAGNOSIS — Z5112 Encounter for antineoplastic immunotherapy: Secondary | ICD-10-CM | POA: Diagnosis not present

## 2023-08-26 DIAGNOSIS — D693 Immune thrombocytopenic purpura: Secondary | ICD-10-CM | POA: Diagnosis not present

## 2023-08-26 LAB — CMP (CANCER CENTER ONLY)
ALT: 20 U/L (ref 0–44)
AST: 19 U/L (ref 15–41)
Albumin: 3.6 g/dL (ref 3.5–5.0)
Alkaline Phosphatase: 75 U/L (ref 38–126)
Anion gap: 5 (ref 5–15)
BUN: 20 mg/dL (ref 8–23)
CO2: 24 mmol/L (ref 22–32)
Calcium: 9.4 mg/dL (ref 8.9–10.3)
Chloride: 104 mmol/L (ref 98–111)
Creatinine: 0.88 mg/dL (ref 0.44–1.00)
GFR, Estimated: >60 mL/min (ref >60)
Glucose, Bld: 147 mg/dL — ABNORMAL HIGH (ref 70–99)
Potassium: 3.6 mmol/L (ref 3.5–5.1)
Sodium: 133 mmol/L — ABNORMAL LOW (ref 135–145)
Total Bilirubin: 0.4 mg/dL (ref 0.0–1.2)
Total Protein: 8.4 g/dL — ABNORMAL HIGH (ref 6.5–8.1)

## 2023-08-26 LAB — CBC WITH DIFFERENTIAL (CANCER CENTER ONLY)
Abs Immature Granulocytes: 0.13 K/uL — ABNORMAL HIGH (ref 0.00–0.07)
Basophils Absolute: 0 K/uL (ref 0.0–0.1)
Basophils Relative: 0 %
Eosinophils Absolute: 0 K/uL (ref 0.0–0.5)
Eosinophils Relative: 0 %
HCT: 34.4 % — ABNORMAL LOW (ref 36.0–46.0)
Hemoglobin: 11.8 g/dL — ABNORMAL LOW (ref 12.0–15.0)
Immature Granulocytes: 2 %
Lymphocytes Relative: 8 %
Lymphs Abs: 0.7 K/uL (ref 0.7–4.0)
MCH: 30.8 pg (ref 26.0–34.0)
MCHC: 34.3 g/dL (ref 30.0–36.0)
MCV: 89.8 fL (ref 80.0–100.0)
Monocytes Absolute: 0.7 K/uL (ref 0.1–1.0)
Monocytes Relative: 8 %
Neutro Abs: 7.1 K/uL (ref 1.7–7.7)
Neutrophils Relative %: 82 %
Platelet Count: 235 K/uL (ref 150–400)
RBC: 3.83 MIL/uL — ABNORMAL LOW (ref 3.87–5.11)
RDW: 14 % (ref 11.5–15.5)
WBC Count: 8.6 K/uL (ref 4.0–10.5)
nRBC: 0 % (ref 0.0–0.2)

## 2023-08-26 LAB — TSH: TSH: 1.18 u[IU]/mL (ref 0.350–4.500)

## 2023-08-26 MED ORDER — SODIUM CHLORIDE 0.9% FLUSH
10.0000 mL | Freq: Once | INTRAVENOUS | Status: AC | PRN
  Administered 2023-08-26: 10 mL

## 2023-08-26 NOTE — Telephone Encounter (Signed)
 Called the patient an rescheduled all appointments due to treatment changes.

## 2023-08-27 ENCOUNTER — Encounter: Payer: Self-pay | Admitting: Radiation Oncology

## 2023-08-28 LAB — T4: T4, Total: 5.8 ug/dL (ref 4.5–12.0)

## 2023-08-31 ENCOUNTER — Encounter: Payer: Self-pay | Admitting: Radiation Oncology

## 2023-08-31 ENCOUNTER — Inpatient Hospital Stay

## 2023-08-31 ENCOUNTER — Ambulatory Visit
Admission: RE | Admit: 2023-08-31 | Discharge: 2023-08-31 | Disposition: A | Discharge status: Home / Self Care | Source: Ambulatory Visit | Attending: Radiation Oncology | Admitting: Radiation Oncology

## 2023-08-31 VITALS — BP 136/72 | HR 86 | Temp 98.0°F | Resp 16 | Wt 249.5 lb

## 2023-08-31 VITALS — BP 136/72 | HR 86 | Temp 98.0°F | Resp 18 | Ht 67.0 in | Wt 249.0 lb

## 2023-08-31 DIAGNOSIS — Z7982 Long term (current) use of aspirin: Secondary | ICD-10-CM | POA: Insufficient documentation

## 2023-08-31 DIAGNOSIS — Z7984 Long term (current) use of oral hypoglycemic drugs: Secondary | ICD-10-CM | POA: Insufficient documentation

## 2023-08-31 DIAGNOSIS — C3411 Malignant neoplasm of upper lobe, right bronchus or lung: Secondary | ICD-10-CM | POA: Insufficient documentation

## 2023-08-31 DIAGNOSIS — C7951 Secondary malignant neoplasm of bone: Secondary | ICD-10-CM | POA: Insufficient documentation

## 2023-08-31 DIAGNOSIS — C3491 Malignant neoplasm of unspecified part of right bronchus or lung: Secondary | ICD-10-CM

## 2023-08-31 DIAGNOSIS — C7931 Secondary malignant neoplasm of brain: Secondary | ICD-10-CM | POA: Insufficient documentation

## 2023-08-31 DIAGNOSIS — Z5112 Encounter for antineoplastic immunotherapy: Secondary | ICD-10-CM | POA: Diagnosis not present

## 2023-08-31 DIAGNOSIS — Z7901 Long term (current) use of anticoagulants: Secondary | ICD-10-CM | POA: Insufficient documentation

## 2023-08-31 DIAGNOSIS — Z79899 Other long term (current) drug therapy: Secondary | ICD-10-CM | POA: Insufficient documentation

## 2023-08-31 DIAGNOSIS — Z7952 Long term (current) use of systemic steroids: Secondary | ICD-10-CM | POA: Insufficient documentation

## 2023-08-31 DIAGNOSIS — D696 Thrombocytopenia, unspecified: Secondary | ICD-10-CM | POA: Insufficient documentation

## 2023-08-31 DIAGNOSIS — C7970 Secondary malignant neoplasm of unspecified adrenal gland: Secondary | ICD-10-CM | POA: Insufficient documentation

## 2023-08-31 DIAGNOSIS — C3490 Malignant neoplasm of unspecified part of unspecified bronchus or lung: Secondary | ICD-10-CM

## 2023-08-31 DIAGNOSIS — Z79890 Hormone replacement therapy (postmenopausal): Secondary | ICD-10-CM | POA: Insufficient documentation

## 2023-08-31 DIAGNOSIS — Z923 Personal history of irradiation: Secondary | ICD-10-CM | POA: Insufficient documentation

## 2023-08-31 HISTORY — DX: Personal history of irradiation: Z92.3

## 2023-08-31 LAB — CBC WITH DIFFERENTIAL (CANCER CENTER ONLY)
Abs Immature Granulocytes: 0.1 K/uL — ABNORMAL HIGH (ref 0.00–0.07)
Basophils Absolute: 0 K/uL (ref 0.0–0.1)
Basophils Relative: 0 %
Eosinophils Absolute: 0 K/uL (ref 0.0–0.5)
Eosinophils Relative: 0 %
HCT: 34.7 % — ABNORMAL LOW (ref 36.0–46.0)
Hemoglobin: 11.8 g/dL — ABNORMAL LOW (ref 12.0–15.0)
Immature Granulocytes: 2 %
Lymphocytes Relative: 9 %
Lymphs Abs: 0.5 K/uL — ABNORMAL LOW (ref 0.7–4.0)
MCH: 30.8 pg (ref 26.0–34.0)
MCHC: 34 g/dL (ref 30.0–36.0)
MCV: 90.6 fL (ref 80.0–100.0)
Monocytes Absolute: 0.6 K/uL (ref 0.1–1.0)
Monocytes Relative: 9 %
Neutro Abs: 5.1 K/uL (ref 1.7–7.7)
Neutrophils Relative %: 80 %
Platelet Count: 158 K/uL (ref 150–400)
RBC: 3.83 MIL/uL — ABNORMAL LOW (ref 3.87–5.11)
RDW: 14.5 % (ref 11.5–15.5)
WBC Count: 6.3 K/uL (ref 4.0–10.5)
nRBC: 0 % (ref 0.0–0.2)

## 2023-08-31 LAB — CMP (CANCER CENTER ONLY)
ALT: 26 U/L (ref 0–44)
AST: 24 U/L (ref 15–41)
Albumin: 3.5 g/dL (ref 3.5–5.0)
Alkaline Phosphatase: 66 U/L (ref 38–126)
Anion gap: 6 (ref 5–15)
BUN: 15 mg/dL (ref 8–23)
CO2: 27 mmol/L (ref 22–32)
Calcium: 9.5 mg/dL (ref 8.9–10.3)
Chloride: 102 mmol/L (ref 98–111)
Creatinine: 0.89 mg/dL (ref 0.44–1.00)
GFR, Estimated: >60 mL/min (ref >60)
Glucose, Bld: 144 mg/dL — ABNORMAL HIGH (ref 70–99)
Potassium: 3.5 mmol/L (ref 3.5–5.1)
Sodium: 135 mmol/L (ref 135–145)
Total Bilirubin: 0.4 mg/dL (ref 0.0–1.2)
Total Protein: 7.5 g/dL (ref 6.5–8.1)

## 2023-08-31 LAB — TSH: TSH: 1.79 u[IU]/mL (ref 0.350–4.500)

## 2023-08-31 MED ORDER — APREPITANT 130 MG/18ML IV EMUL
130.0000 mg | Freq: Once | INTRAVENOUS | Status: AC
  Administered 2023-08-31: 130 mg via INTRAVENOUS
  Filled 2023-08-31: qty 18

## 2023-08-31 MED ORDER — DEXAMETHASONE SODIUM PHOSPHATE 10 MG/ML IJ SOLN
10.0000 mg | Freq: Once | INTRAMUSCULAR | Status: AC
  Administered 2023-08-31: 10 mg via INTRAVENOUS
  Filled 2023-08-31: qty 1

## 2023-08-31 MED ORDER — SODIUM CHLORIDE 0.9 % IV SOLN
622.5000 mg | Freq: Once | INTRAVENOUS | Status: AC
  Administered 2023-08-31: 620 mg via INTRAVENOUS
  Filled 2023-08-31: qty 62

## 2023-08-31 MED ORDER — SODIUM CHLORIDE 0.9 % IV SOLN
500.0000 mg/m2 | Freq: Once | INTRAVENOUS | Status: AC
  Administered 2023-08-31: 1100 mg via INTRAVENOUS
  Filled 2023-08-31: qty 40

## 2023-08-31 MED ORDER — SODIUM CHLORIDE 0.9 % IV SOLN
200.0000 mg | Freq: Once | INTRAVENOUS | Status: AC
  Administered 2023-08-31: 200 mg via INTRAVENOUS
  Filled 2023-08-31: qty 200

## 2023-08-31 MED ORDER — PALONOSETRON HCL INJECTION 0.25 MG/5ML
0.2500 mg | Freq: Once | INTRAVENOUS | Status: AC
  Administered 2023-08-31: 0.25 mg via INTRAVENOUS
  Filled 2023-08-31: qty 5

## 2023-08-31 MED ORDER — SODIUM CHLORIDE 0.9 % IV SOLN
INTRAVENOUS | Status: DC
Start: 2023-08-31 — End: 2023-08-31

## 2023-08-31 NOTE — Progress Notes (Addendum)
 Ok to proceed with Carbo 620mg  today despite renal improvement and ok to proceed with Keytruda  while pt is taking dexamethasone  2mg  (~12.5mg  prednisone ) daily per Cassie H., PA-C.  Lynnsie Linders, PharmD, MBA

## 2023-08-31 NOTE — Progress Notes (Signed)
 Becky [name] is here today for follow up post radiation to the  right lung,ribs and brain.  Patient completed treatment on 07/28/20.    Does the patient complain of any of the following: Pain/headaches: light headaches 2-3 times per week.  Visual/auditory:Blurry vision.  Cognitive changes: No Speech: Difficulty getting words out.  Shortness of breath w/wo exertion: Yes, at all times.  Cough: Yes, productive Hemoptysis: No Pain with swallowing: No Swallowing/choking concerns: No Appetite: Good Energy Level: low Post radiation skin Changes: Forehead, behind ears hyperpigmented and peeling.     Additional comments if applicable:  BP 136/72 (BP Location: Left Arm)   Pulse 86   Temp 98 F (36.7 C) (Temporal)   Resp 18   Ht 5' 7 (1.702 m)   Wt 249 lb (112.9 kg)   SpO2 100%   BMI 39.00 kg/m

## 2023-08-31 NOTE — Progress Notes (Signed)
 Radiation Oncology         [phone] ________________________________  Name: Becky [name] MRN: [id number]  Date: 08/31/2023  DOB: Jul 18, 1960  Follow-Up Visit Note  CC: [name], MD  [name], MD    ICD-10-CM   1. Primary malignant neoplasm of lung with metastasis to brain [facility])  C34.90    C79.31     She comes in today for routine follow-up.  Earlier today she received chemotherapy and tolerated this well.  Diagnosis: Stage IVB (cT2a, cNX, cM1c2) NSCLC with metastases to adrenal gland, bone, and brain.   Interval Since Last Radiation: 1 month and 2 days   Intent: Palliative  Radiation Treatment Dates: First Treatment Date: 2023-07-08 -- Last Treatment Date: 2023-07-29 Site/Dose/Technique/Mode:  Plan Name: Lung_R Site: Bronchus, Right Technique: 3D Mode: Photon Dose Per Fraction: 2 Gy Prescribed Dose (Delivered / Prescribed): 14 Gy / 14 Gy Prescribed Fxs (Delivered / Prescribed): 7 / 7   Plan Name: Lung_R:1 Site: Bronchus, Right Technique: 3D Mode: Photon Dose Per Fraction: 2 Gy Prescribed Dose (Delivered / Prescribed): 12 Gy / 12 Gy Prescribed Fxs (Delivered / Prescribed): 6 / 6   Plan Name: Chest_R_Rib Site: Ribs, Right Technique: 3D Mode: Photon Dose Per Fraction: 4 Gy Prescribed Dose (Delivered / Prescribed): 20 Gy / 20 Gy Prescribed Fxs (Delivered / Prescribed): 5 / 5   Plan Name: Brain Site: Brain Technique: Isodose Plan Mode: Photon Dose Per Fraction: 3 Gy Prescribed Dose (Delivered / Prescribed): 30 Gy / 30 Gy Prescribed Fxs (Delivered / Prescribed): 10 / 10  Narrative:  The patient returns today for routine follow-up. She tolerated radiation therapy relatively other than mild worsening SOB which she managed with her albuterol  inhaler.  She also noted resolution of her headaches with Decadron  and felt much better in terms of energy while on it (she was then accordingly started on a Decadron  taper).      To review, her original  plan was to treated with concurrent chemotherapy with weekly cisplatin and paclitaxel starting on 07/12/33. She however presented to the ED and was admitted on 07/12/23 with a CVA and was found to have metastatic disease to the brain. Imaging of the brain included an MRI of the brain on 07/06 which showed numerous enhancing lesions in the cerebral hemispheres and cerebellum concerning for intracranial metastatic disease, including a dominant lesion in the left cerebellum with surrounding enhancement and mild local mass effect. No evidence of midline shift was demonstrated.   Imaging performed while inpatient (prior to discharge on 07/08) also included:  -- CTA of the head and neck on 07/07 that showed: an increase in size of the known RUL pulmonary nodule with a new surrounding consolidation in the area concerning for postobstructive PNA. Imaging otherwise showed no evidence of emergent large vessel occlusion or proximal hemodynamically significant stenosis. -- MRI of the cervical spine on 07/07 showed: no evidence of metastatic disease in the cervical spine, moderate left foraminal stenosis at C4-C5, and moderate right foraminal stenosis at C5-C6 due to uncovertebral spurring and disc osteophyte complexes.  She was again hospitalized from 07/17 through 07/19 after presenting to the ED with left arm numbness. Head CT performed at that time was negative for acute intracranial abnormality. A CTA of the head and neck was also performed which showed no emergent large vessel occlusion or hemodynamically significant stenosis. She was admitted for further management and underwent an MRI of the brain which showed small ischemic changes mainly affecting the cerebellum and she was  discharged on xarelto . MRI also redemonstrated the dozens of sub-centimeter brain metastases (measuring greater than 19 in the posterior fossa alone). The largest lesions noted included a 9 mm lesion in the left cerebellum and a 7 mm lesion at  the left frontal operculum. Only mild associated vasogenic edema was demonstrated, most pronounced in the cerebellum. A suspected small lytic bone metastasis was also demonstrated at the superior clivus and posterior floor of the sella turcica. Hospital work-up was also notable for a-fib which was managed accordingly.   Given her metastatic disease to the brain, Dr. Sherrod has discontinued her weekly chemotherapy and modified her treatment plan to include chemotherapy and immunotherapy with carboplatin , Alimta , and Keytruda  every 3 weeks. She was initially scheduled to receive her first dose of this regimen on 08/04, however her labs that day showed significant thrombocytopenia. She was accordingly started on Medrol  Dosepak with mild improvement of her platelet count. She subsequently required further management consisting of two IVIG infusions in this setting.   Her platelet count has now improved and she is scheduled to receive her first dose of chemotherapy today.   On evaluation patient reports very mild occasional headaches.  She has minimal visual changes at this time.  She denies any double vision.  She denies any pain within the chest region significant cough or hemoptysis.  She is in good spirits today.  Allergies:  is allergic to apresoline  [hydralazine ], latex, other, and prednisone .  Meds: Current Outpatient Medications  Medication Sig Dispense Refill   acetaminophen  (TYLENOL ) 500 MG tablet Take 1,000 mg by mouth every 6 (six) hours as needed for mild pain (pain score 1-3).     albuterol  (VENTOLIN  HFA) 108 (90 Base) MCG/ACT inhaler Inhale 2 puffs into the lungs every 4 (four) hours as needed for wheezing or shortness of breath. 18 g 0   Ashwagandha 500 MG CHEW Chew 1 Dose by mouth daily. (Patient not taking: Reported on 08/13/2023)     aspirin  EC 81 MG tablet Take 1 tablet (81 mg total) by mouth daily. Swallow whole. 30 tablet 12   azelastine  (ASTELIN ) 0.1 % nasal spray Place 2 sprays  into both nostrils 2 (two) times daily. Use in each nostril as directed 30 mL 0   Dandelion Root 525 MG CAPS Take 1 capsule by mouth daily. (Patient not taking: Reported on 08/13/2023)     dexamethasone  (DECADRON ) 4 MG tablet Take 1 tablet (4 mg total) by mouth 2 (two) times daily with a meal. 30 tablet 0   dexlansoprazole  (DEXILANT ) 60 MG capsule Take 1 capsule (60 mg total) by mouth daily. 90 capsule 0   diltiazem  (TIADYLT  ER) 360 MG 24 hr capsule TAKE ONE (1) CAPSULE BY MOUTH ONCE DAILY *NEW PRESCRIPTION REQUEST* 90 capsule 3   EPINEPHrine  0.3 mg/0.3 mL IJ SOAJ injection Inject 0.3 mg into the muscle once as needed for anaphylaxis.     folic acid  (FOLVITE ) 1 MG tablet Take 1 tablet (1 mg total) by mouth daily. 90 tablet 2   glimepiride (AMARYL) 2 MG tablet Take 2 mg by mouth daily with breakfast.     KLOR-CON  M20 20 MEQ tablet Take 20 mEq by mouth daily. [name] takes as needed when she has cramps     linaclotide  (LINZESS ) 72 MCG capsule Take 1 capsule (72 mcg total) by mouth daily before breakfast. 90 capsule 3   LORazepam  (ATIVAN ) 0.5 MG tablet Take 1 tablet (0.5 mg total) by mouth every 8 (eight) hours as needed for anxiety. Take  1 hour prior to radiation treatment 30 tablet 0   methylPREDNISolone  (MEDROL  DOSEPAK) 4 MG TBPK tablet Use as instructed (Patient not taking: Reported on 08/13/2023) 21 tablet 0   Misc Natural Products (BEET ROOT) 500 MG CAPS Take 1 capsule by mouth daily. (Patient not taking: Reported on 08/13/2023)     montelukast  (SINGULAIR ) 10 MG tablet Take 10 mg by mouth at bedtime.     NON FORMULARY Pt uses a cpap nightly     olmesartan  (BENICAR ) 40 MG tablet Take 1 tablet (40 mg total) by mouth daily. (Patient taking differently: Take 20 mg by mouth daily.) 90 tablet 3   ondansetron  (ZOFRAN ) 8 MG tablet TAKE 1 TABLET BY MOUTH EVERY 8 HOURS FOR NAUSEA & VOMITING, START ON THE THIRD DAY AFTER CHEMOTHERAPY 30 tablet 11   ondansetron  (ZOFRAN -ODT) 8 MG disintegrating tablet Take 1 tablet (8  mg total) by mouth every 6 (six) hours as needed for nausea or vomiting. 30 tablet 1   Oregano Oil-Flaxseed Oil (OREGANO OIL IMMUNE SUPPORT) 50-25 MG CAPS Take 1 Dose by mouth daily. (Patient not taking: Reported on 08/13/2023)     OVER THE COUNTER MEDICATION Take 1 capsule by mouth daily. SAFFRON (Patient not taking: Reported on 08/13/2023)     polyethylene glycol (MIRALAX ) 17 g packet Take 17 g by mouth daily. (Patient taking differently: Take 17 g by mouth daily. As needed) 30 each 1   prochlorperazine  (COMPAZINE ) 10 MG tablet Take 1 tablet (10 mg total) by mouth every 6 (six) hours as needed. (Patient not taking: Reported on 08/13/2023) 30 tablet 2   promethazine -dextromethorphan  (PROMETHAZINE -DM) 6.25-15 MG/5ML syrup Take 5 mLs by mouth 4 (four) times daily as needed. (Patient not taking: Reported on 08/13/2023) 118 mL 0   RESTASIS  0.05 % ophthalmic emulsion Place 1 drop into both eyes 2 (two) times daily.     rivaroxaban  (XARELTO ) 20 MG TABS tablet Take 1 tablet (20 mg total) by mouth daily. 90 tablet 1   rosuvastatin  (CRESTOR ) 20 MG tablet Take 1 tablet (20 mg total) by mouth daily. 90 tablet 3   spironolactone  (ALDACTONE ) 25 MG tablet Take 1 tablet (25 mg total) by mouth daily. 90 tablet 3   SYNTHROID  25 MCG tablet Take 25 mcg by mouth daily.     traMADol  (ULTRAM ) 50 MG tablet Take 25 mg by mouth every 6 (six) hours as needed for moderate pain (pain score 4-6). (Patient not taking: Reported on 08/13/2023)     vitamin E 180 MG (400 UNITS) capsule Take 400 Units by mouth daily. (Patient not taking: Reported on 08/13/2023)     No current facility-administered medications for this encounter.    Physical Findings: The patient is in no acute distress. Patient is alert and oriented.  height is 5' 7 (1.702 m) and weight is 249 lb (112.9 kg). Her temporal temperature is 98 F (36.7 C). Her blood pressure is 136/72 and her pulse is 86. Her respiration is 18 and oxygen saturation is 100%. .   Lungs are clear  to auscultation bilaterally. Heart has regular rate and rhythm. No palpable cervical, supraclavicular, or axillary adenopathy. Abdomen soft, non-tender, normal bowel sounds.   Lab Findings: Lab Results  Component Value Date   WBC 6.3 08/31/2023   HGB 11.8 (L) 08/31/2023   HCT 34.7 (L) 08/31/2023   MCV 90.6 08/31/2023   PLT 158 08/31/2023    Radiographic Findings: No results found.  Impression: Stage IVB (cT2a, cNX, cM1c2) NSCLC with metastases to adrenal gland,  bone, and brain; s/p palliative radiation completed on 07/29/2023   She has recovered well from the effects of radiation therapy.  She is about to complete her Decadron  taper.  She will remain under the care of Dr. Sherrod. Radiation follow-up PRN. We appreciate the opportunity to take part in this patient's care.     ____________________________________   Leeroy Due, PA-C   Lynwood CHARM Nasuti, PhD, MD   [facility]  Radiation Oncology Direct Dial: [phone]  Fax: [phone] [email or web address]    This document serves as a record of services personally performed by Lynwood Nasuti, MD. It was created on his behalf by Dorthy Fuse, a trained medical scribe. The creation of this record is based on the scribe's personal observations and the provider's statements to them. This document has been checked and approved by the attending provider. \

## 2023-08-31 NOTE — Patient Instructions (Signed)
 [facility] - A DEPT OF [facility]  Discharge Instructions: Thank you for choosing [facility] to provide your oncology and hematology care.   If you have a lab appointment with the Cancer Center, please go directly to the Cancer Center and check in at the registration area.   Wear comfortable clothing and clothing appropriate for easy access to any Portacath or PICC line.   We strive to give you quality time with your provider. You may need to reschedule your appointment if you arrive late (15 or more minutes).  Arriving late affects you and other patients whose appointments are after yours.  Also, if you miss three or more appointments without notifying the office, you may be dismissed from the clinic at the provider's discretion.      For prescription refill requests, have your pharmacy contact our office and allow 72 hours for refills to be completed.    Today you received the following chemotherapy and/or immunotherapy agents: Keytruda, Alimta, Carboplatin      To help prevent nausea and vomiting after your treatment, we encourage you to take your nausea medication as directed.  BELOW ARE SYMPTOMS THAT SHOULD BE REPORTED IMMEDIATELY: *FEVER GREATER THAN 100.4 F (38 C) OR HIGHER *CHILLS OR SWEATING *NAUSEA AND VOMITING THAT IS NOT CONTROLLED WITH YOUR NAUSEA MEDICATION *UNUSUAL SHORTNESS OF BREATH *UNUSUAL BRUISING OR BLEEDING *URINARY PROBLEMS (pain or burning when urinating, or frequent urination) *BOWEL PROBLEMS (unusual diarrhea, constipation, pain near the anus) TENDERNESS IN MOUTH AND THROAT WITH OR WITHOUT PRESENCE OF ULCERS (sore throat, sores in mouth, or a toothache) UNUSUAL RASH, SWELLING OR PAIN  UNUSUAL VAGINAL DISCHARGE OR ITCHING   Items with * indicate a potential emergency and should be followed up as soon as possible or go to the Emergency Department if any problems should occur.  Please show the CHEMOTHERAPY ALERT  CARD or IMMUNOTHERAPY ALERT CARD at check-in to the Emergency Department and triage nurse.  Should you have questions after your visit or need to cancel or reschedule your appointment, please contact [facility] - A DEPT OF [facility]  Dept: [phone]  and follow the prompts.  Office hours are 8:00 a.m. to 4:30 p.m. Monday - Friday. Please note that voicemails left after 4:00 p.m. may not be returned until the following business day.  We are closed weekends and major holidays. You have access to a nurse at all times for urgent questions. Please call the main number to the clinic Dept: [phone] and follow the prompts.   For any non-urgent questions, you may also contact your provider using MyChart. We now offer e-Visits for anyone 35 and older to request care online for non-urgent symptoms. For details visit [email or web address].   Also download the MyChart app! Go to the app store, search "MyChart", open the app, select [place], and log in with your MyChart username and password.

## 2023-09-01 ENCOUNTER — Other Ambulatory Visit

## 2023-09-01 ENCOUNTER — Encounter: Payer: Self-pay | Admitting: Internal Medicine

## 2023-09-01 ENCOUNTER — Ambulatory Visit

## 2023-09-01 ENCOUNTER — Ambulatory Visit: Admitting: Internal Medicine

## 2023-09-01 LAB — T4: T4, Total: 7.4 ug/dL (ref 4.5–12.0)

## 2023-09-01 NOTE — Telephone Encounter (Signed)
-----   Message from Nurse Cortney P sent at 08/31/2023  4:47 PM EDT ----- Regarding: Dr Sherrod first time patient Dr Sherrod patient, first time Keytruda /Alimta Dennice. Patient tolerated treatment well. No complaints

## 2023-09-01 NOTE — Telephone Encounter (Signed)
Called & left message for pt to return call to discuss how she did with her recent treatment.

## 2023-09-03 ENCOUNTER — Telehealth: Payer: Self-pay | Admitting: Medical Oncology

## 2023-09-03 NOTE — Telephone Encounter (Signed)
 F/u treatment call-Pt stated She is feeling fine after her treatment on 08/25 with Carboplatin ,Alimta  and Keytruda . She is engaged in her normal daily routine, eating and drinking well. She has prn meds at home . Information given to pt for how to contact Cancer Center.
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
## 2023-09-07 ENCOUNTER — Other Ambulatory Visit: Payer: Self-pay

## 2023-09-07 ENCOUNTER — Inpatient Hospital Stay (HOSPITAL_COMMUNITY)
Admission: EM | Admit: 2023-09-07 | Discharge: 2023-09-10 | DRG: 391 | Disposition: A | Discharge status: Home / Self Care | Attending: Internal Medicine | Admitting: Internal Medicine

## 2023-09-07 ENCOUNTER — Emergency Department (HOSPITAL_COMMUNITY)

## 2023-09-07 ENCOUNTER — Encounter (HOSPITAL_COMMUNITY): Payer: Self-pay

## 2023-09-07 DIAGNOSIS — C7970 Secondary malignant neoplasm of unspecified adrenal gland: Secondary | ICD-10-CM | POA: Diagnosis present

## 2023-09-07 DIAGNOSIS — E1169 Type 2 diabetes mellitus with other specified complication: Secondary | ICD-10-CM | POA: Diagnosis present

## 2023-09-07 DIAGNOSIS — E785 Hyperlipidemia, unspecified: Secondary | ICD-10-CM | POA: Diagnosis present

## 2023-09-07 DIAGNOSIS — K279 Peptic ulcer, site unspecified, unspecified as acute or chronic, without hemorrhage or perforation: Secondary | ICD-10-CM | POA: Diagnosis present

## 2023-09-07 DIAGNOSIS — E538 Deficiency of other specified B group vitamins: Secondary | ICD-10-CM | POA: Diagnosis present

## 2023-09-07 DIAGNOSIS — Z91048 Other nonmedicinal substance allergy status: Secondary | ICD-10-CM

## 2023-09-07 DIAGNOSIS — Z96651 Presence of right artificial knee joint: Secondary | ICD-10-CM | POA: Diagnosis present

## 2023-09-07 DIAGNOSIS — Z888 Allergy status to other drugs, medicaments and biological substances status: Secondary | ICD-10-CM

## 2023-09-07 DIAGNOSIS — E222 Syndrome of inappropriate secretion of antidiuretic hormone: Secondary | ICD-10-CM | POA: Diagnosis present

## 2023-09-07 DIAGNOSIS — J45909 Unspecified asthma, uncomplicated: Secondary | ICD-10-CM | POA: Diagnosis present

## 2023-09-07 DIAGNOSIS — D849 Immunodeficiency, unspecified: Secondary | ICD-10-CM | POA: Diagnosis present

## 2023-09-07 DIAGNOSIS — E871 Hypo-osmolality and hyponatremia: Secondary | ICD-10-CM | POA: Diagnosis present

## 2023-09-07 DIAGNOSIS — D61810 Antineoplastic chemotherapy induced pancytopenia: Secondary | ICD-10-CM | POA: Diagnosis present

## 2023-09-07 DIAGNOSIS — K5909 Other constipation: Secondary | ICD-10-CM | POA: Diagnosis present

## 2023-09-07 DIAGNOSIS — C7951 Secondary malignant neoplasm of bone: Secondary | ICD-10-CM | POA: Diagnosis present

## 2023-09-07 DIAGNOSIS — E8809 Other disorders of plasma-protein metabolism, not elsewhere classified: Secondary | ICD-10-CM | POA: Diagnosis present

## 2023-09-07 DIAGNOSIS — Z95828 Presence of other vascular implants and grafts: Secondary | ICD-10-CM

## 2023-09-07 DIAGNOSIS — I10 Essential (primary) hypertension: Secondary | ICD-10-CM | POA: Diagnosis present

## 2023-09-07 DIAGNOSIS — Z87891 Personal history of nicotine dependence: Secondary | ICD-10-CM

## 2023-09-07 DIAGNOSIS — I482 Chronic atrial fibrillation, unspecified: Secondary | ICD-10-CM | POA: Diagnosis present

## 2023-09-07 DIAGNOSIS — Z923 Personal history of irradiation: Secondary | ICD-10-CM

## 2023-09-07 DIAGNOSIS — Z91040 Latex allergy status: Secondary | ICD-10-CM

## 2023-09-07 DIAGNOSIS — E039 Hypothyroidism, unspecified: Secondary | ICD-10-CM | POA: Diagnosis present

## 2023-09-07 DIAGNOSIS — K5900 Constipation, unspecified: Secondary | ICD-10-CM | POA: Diagnosis not present

## 2023-09-07 DIAGNOSIS — C7972 Secondary malignant neoplasm of left adrenal gland: Secondary | ICD-10-CM | POA: Diagnosis not present

## 2023-09-07 DIAGNOSIS — D72819 Decreased white blood cell count, unspecified: Secondary | ICD-10-CM | POA: Diagnosis not present

## 2023-09-07 DIAGNOSIS — D693 Immune thrombocytopenic purpura: Secondary | ICD-10-CM | POA: Diagnosis present

## 2023-09-07 DIAGNOSIS — Z8249 Family history of ischemic heart disease and other diseases of the circulatory system: Secondary | ICD-10-CM

## 2023-09-07 DIAGNOSIS — Z7901 Long term (current) use of anticoagulants: Secondary | ICD-10-CM

## 2023-09-07 DIAGNOSIS — K219 Gastro-esophageal reflux disease without esophagitis: Secondary | ICD-10-CM | POA: Diagnosis present

## 2023-09-07 DIAGNOSIS — T451X5A Adverse effect of antineoplastic and immunosuppressive drugs, initial encounter: Secondary | ICD-10-CM | POA: Diagnosis present

## 2023-09-07 DIAGNOSIS — C7931 Secondary malignant neoplasm of brain: Secondary | ICD-10-CM | POA: Diagnosis present

## 2023-09-07 DIAGNOSIS — E869 Volume depletion, unspecified: Secondary | ICD-10-CM | POA: Diagnosis present

## 2023-09-07 DIAGNOSIS — Z79899 Other long term (current) drug therapy: Secondary | ICD-10-CM

## 2023-09-07 DIAGNOSIS — Z8673 Personal history of transient ischemic attack (TIA), and cerebral infarction without residual deficits: Secondary | ICD-10-CM

## 2023-09-07 DIAGNOSIS — R1032 Left lower quadrant pain: Secondary | ICD-10-CM | POA: Diagnosis not present

## 2023-09-07 DIAGNOSIS — D649 Anemia, unspecified: Secondary | ICD-10-CM | POA: Diagnosis present

## 2023-09-07 DIAGNOSIS — D696 Thrombocytopenia, unspecified: Secondary | ICD-10-CM | POA: Diagnosis present

## 2023-09-07 DIAGNOSIS — I2510 Atherosclerotic heart disease of native coronary artery without angina pectoris: Secondary | ICD-10-CM | POA: Diagnosis present

## 2023-09-07 DIAGNOSIS — I2583 Coronary atherosclerosis due to lipid rich plaque: Secondary | ICD-10-CM | POA: Diagnosis present

## 2023-09-07 DIAGNOSIS — C3491 Malignant neoplasm of unspecified part of right bronchus or lung: Secondary | ICD-10-CM | POA: Diagnosis present

## 2023-09-07 DIAGNOSIS — Z79631 Long term (current) use of antimetabolite agent: Secondary | ICD-10-CM

## 2023-09-07 DIAGNOSIS — D6869 Other thrombophilia: Secondary | ICD-10-CM | POA: Diagnosis present

## 2023-09-07 DIAGNOSIS — Z7984 Long term (current) use of oral hypoglycemic drugs: Secondary | ICD-10-CM

## 2023-09-07 DIAGNOSIS — Z7969 Long term (current) use of other immunomodulators and immunosuppressants: Secondary | ICD-10-CM

## 2023-09-07 DIAGNOSIS — K5720 Diverticulitis of large intestine with perforation and abscess without bleeding: Principal | ICD-10-CM | POA: Diagnosis present

## 2023-09-07 DIAGNOSIS — C3490 Malignant neoplasm of unspecified part of unspecified bronchus or lung: Secondary | ICD-10-CM | POA: Diagnosis not present

## 2023-09-07 DIAGNOSIS — Z833 Family history of diabetes mellitus: Secondary | ICD-10-CM

## 2023-09-07 DIAGNOSIS — Z7982 Long term (current) use of aspirin: Secondary | ICD-10-CM

## 2023-09-07 DIAGNOSIS — Z79890 Hormone replacement therapy (postmenopausal): Secondary | ICD-10-CM

## 2023-09-07 DIAGNOSIS — Z7952 Long term (current) use of systemic steroids: Secondary | ICD-10-CM

## 2023-09-07 DIAGNOSIS — Z79630 Long term (current) use of alkylating agent: Secondary | ICD-10-CM

## 2023-09-07 LAB — COMPREHENSIVE METABOLIC PANEL WITH GFR
ALT: 34 U/L (ref 0–44)
AST: 27 U/L (ref 15–41)
Albumin: 3 g/dL — ABNORMAL LOW (ref 3.5–5.0)
Alkaline Phosphatase: 54 U/L (ref 38–126)
Anion gap: 8 (ref 5–15)
BUN: 16 mg/dL (ref 8–23)
CO2: 24 mmol/L (ref 22–32)
Calcium: 9.2 mg/dL (ref 8.9–10.3)
Chloride: 101 mmol/L (ref 98–111)
Creatinine, Ser: 0.88 mg/dL (ref 0.44–1.00)
GFR, Estimated: >60 mL/min (ref >60)
Glucose, Bld: 119 mg/dL — ABNORMAL HIGH (ref 70–99)
Potassium: 4.1 mmol/L (ref 3.5–5.1)
Sodium: 133 mmol/L — ABNORMAL LOW (ref 135–145)
Total Bilirubin: 0.9 mg/dL (ref 0.0–1.2)
Total Protein: 6.9 g/dL (ref 6.5–8.1)

## 2023-09-07 LAB — URINALYSIS, W/ REFLEX TO CULTURE (INFECTION SUSPECTED)
Bacteria, UA: NONE SEEN
Bilirubin Urine: NEGATIVE
Glucose, UA: NEGATIVE mg/dL
Ketones, ur: NEGATIVE mg/dL
Leukocytes,Ua: NEGATIVE
Nitrite: NEGATIVE
Protein, ur: NEGATIVE mg/dL
Specific Gravity, Urine: 1.021 (ref 1.005–1.030)
pH: 7 (ref 5.0–8.0)

## 2023-09-07 LAB — CBC WITH DIFFERENTIAL/PLATELET
Abs Immature Granulocytes: 0.03 K/uL (ref 0.00–0.07)
Basophils Absolute: 0 K/uL (ref 0.0–0.1)
Basophils Relative: 0 %
Eosinophils Absolute: 0 K/uL (ref 0.0–0.5)
Eosinophils Relative: 0 %
HCT: 32.6 % — ABNORMAL LOW (ref 36.0–46.0)
Hemoglobin: 11.2 g/dL — ABNORMAL LOW (ref 12.0–15.0)
Immature Granulocytes: 1 %
Lymphocytes Relative: 10 %
Lymphs Abs: 0.3 K/uL — ABNORMAL LOW (ref 0.7–4.0)
MCH: 30.9 pg (ref 26.0–34.0)
MCHC: 34.4 g/dL (ref 30.0–36.0)
MCV: 89.8 fL (ref 80.0–100.0)
Monocytes Absolute: 0.2 K/uL (ref 0.1–1.0)
Monocytes Relative: 8 %
Neutro Abs: 2.4 K/uL (ref 1.7–7.7)
Neutrophils Relative %: 81 %
Platelets: 95 K/uL — ABNORMAL LOW (ref 150–400)
RBC: 3.63 MIL/uL — ABNORMAL LOW (ref 3.87–5.11)
RDW: 13.6 % (ref 11.5–15.5)
WBC: 2.9 K/uL — ABNORMAL LOW (ref 4.0–10.5)
nRBC: 0 % (ref 0.0–0.2)

## 2023-09-07 LAB — GLUCOSE, CAPILLARY
Glucose-Capillary: 114 mg/dL — ABNORMAL HIGH (ref 70–99)
Glucose-Capillary: 120 mg/dL — ABNORMAL HIGH (ref 70–99)

## 2023-09-07 LAB — LACTIC ACID, PLASMA: Lactic Acid, Venous: 1.3 mmol/L (ref 0.5–1.9)

## 2023-09-07 LAB — PROTIME-INR
INR: 1.1 (ref 0.8–1.2)
Prothrombin Time: 15.3 s — ABNORMAL HIGH (ref 11.4–15.2)

## 2023-09-07 LAB — LIPASE, BLOOD: Lipase: 33 U/L (ref 11–51)

## 2023-09-07 MED ORDER — ACETAMINOPHEN 325 MG PO TABS: 650.0000 mg | ORAL_TABLET | Freq: Four times a day (QID) | ORAL | Status: DC | PRN

## 2023-09-07 MED ORDER — CHLORHEXIDINE GLUCONATE CLOTH 2 % EX PADS
6.0000 | MEDICATED_PAD | Freq: Every day | CUTANEOUS | Status: DC
  Administered 2023-09-07 – 2023-09-10 (×4): 6 via TOPICAL

## 2023-09-07 MED ORDER — METOPROLOL TARTRATE 5 MG/5ML IV SOLN: 2.5000 mg | Freq: Four times a day (QID) | INTRAVENOUS | Status: DC

## 2023-09-07 MED ORDER — PIPERACILLIN-TAZOBACTAM 3.375 G IVPB
3.3750 g | Freq: Three times a day (TID) | INTRAVENOUS | Status: DC
  Administered 2023-09-07 – 2023-09-10 (×8): 3.375 g via INTRAVENOUS
  Filled 2023-09-07 (×8): qty 50

## 2023-09-07 MED ORDER — FENTANYL CITRATE (PF) 100 MCG/2ML IJ SOLN: 12.5000 ug | INTRAMUSCULAR | Status: DC | PRN

## 2023-09-07 MED ORDER — PANTOPRAZOLE SODIUM 40 MG IV SOLR
40.0000 mg | INTRAVENOUS | Status: DC
  Administered 2023-09-07 – 2023-09-09 (×3): 40 mg via INTRAVENOUS
  Filled 2023-09-07 (×3): qty 10

## 2023-09-07 MED ORDER — PIPERACILLIN-TAZOBACTAM 3.375 G IVPB 30 MIN
3.3750 g | Freq: Once | INTRAVENOUS | Status: AC
  Administered 2023-09-07: 3.375 g via INTRAVENOUS
  Filled 2023-09-07: qty 50

## 2023-09-07 MED ORDER — LACTATED RINGERS IV SOLN
INTRAVENOUS | Status: AC
  Administered 2023-09-07: 900 mL via INTRAVENOUS

## 2023-09-07 MED ORDER — POLYETHYLENE GLYCOL 3350 17 G PO PACK: 17.0000 g | PACK | Freq: Every day | ORAL | Status: DC | PRN

## 2023-09-07 MED ORDER — METOPROLOL TARTRATE 5 MG/5ML IV SOLN
2.5000 mg | Freq: Four times a day (QID) | INTRAVENOUS | Status: DC
  Administered 2023-09-08 – 2023-09-09 (×6): 2.5 mg via INTRAVENOUS
  Filled 2023-09-07 (×7): qty 5

## 2023-09-07 MED ORDER — ONDANSETRON HCL 4 MG/2ML IJ SOLN
4.0000 mg | Freq: Once | INTRAMUSCULAR | Status: AC
  Administered 2023-09-07: 4 mg via INTRAVENOUS
  Filled 2023-09-07: qty 2

## 2023-09-07 MED ORDER — ACETAMINOPHEN 325 MG PO TABS
650.0000 mg | ORAL_TABLET | Freq: Once | ORAL | Status: AC
  Administered 2023-09-07: 650 mg via ORAL
  Filled 2023-09-07: qty 2

## 2023-09-07 MED ORDER — DILTIAZEM HCL ER BEADS 120 MG PO CP24
360.0000 mg | ORAL_CAPSULE | Freq: Every day | ORAL | Status: DC
  Administered 2023-09-07 – 2023-09-08 (×2): 360 mg via ORAL
  Filled 2023-09-07 (×5): qty 3

## 2023-09-07 MED ORDER — AZELASTINE HCL 0.1 % NA SOLN: 2.0000 | Freq: Two times a day (BID) | NASAL | Status: DC | PRN

## 2023-09-07 MED ORDER — INSULIN ASPART 100 UNIT/ML IJ SOLN
0.0000 [IU] | Freq: Four times a day (QID) | INTRAMUSCULAR | Status: DC
  Administered 2023-09-08: 1 [IU] via SUBCUTANEOUS
  Administered 2023-09-08: 2 [IU] via SUBCUTANEOUS

## 2023-09-07 MED ORDER — ACETAMINOPHEN 650 MG RE SUPP: 650.0000 mg | Freq: Four times a day (QID) | RECTAL | Status: DC | PRN

## 2023-09-07 MED ORDER — CYCLOSPORINE 0.05 % OP EMUL: 1.0000 [drp] | Freq: Every day | OPHTHALMIC | Status: DC | PRN

## 2023-09-07 MED ORDER — ASPIRIN 81 MG PO TBEC
81.0000 mg | DELAYED_RELEASE_TABLET | Freq: Every day | ORAL | Status: DC
  Administered 2023-09-08 – 2023-09-10 (×3): 81 mg via ORAL
  Filled 2023-09-07 (×3): qty 1

## 2023-09-07 MED ORDER — IPRATROPIUM-ALBUTEROL 0.5-2.5 (3) MG/3ML IN SOLN: 3.0000 mL | RESPIRATORY_TRACT | Status: DC | PRN

## 2023-09-07 MED ORDER — DEXAMETHASONE SODIUM PHOSPHATE 4 MG/ML IJ SOLN
4.0000 mg | Freq: Two times a day (BID) | INTRAMUSCULAR | Status: DC
  Administered 2023-09-07 – 2023-09-09 (×4): 4 mg via INTRAVENOUS
  Filled 2023-09-07 (×5): qty 1

## 2023-09-07 MED ORDER — LIDOCAINE-PRILOCAINE 2.5-2.5 % EX CREA
TOPICAL_CREAM | Freq: Once | CUTANEOUS | Status: AC
  Administered 2023-09-07: 1 via TOPICAL
  Filled 2023-09-07: qty 5

## 2023-09-07 MED ORDER — LACTATED RINGERS IV BOLUS
500.0000 mL | Freq: Once | INTRAVENOUS | Status: AC
  Administered 2023-09-07: 500 mL via INTRAVENOUS

## 2023-09-07 MED ORDER — ONDANSETRON HCL 4 MG PO TABS
4.0000 mg | ORAL_TABLET | Freq: Four times a day (QID) | ORAL | Status: DC | PRN
Start: 2023-09-07 — End: 2023-09-10

## 2023-09-07 MED ORDER — HEPARIN SODIUM (PORCINE) 5000 UNIT/ML IJ SOLN
5000.0000 [IU] | Freq: Three times a day (TID) | INTRAMUSCULAR | Status: DC
  Administered 2023-09-07 – 2023-09-10 (×9): 5000 [IU] via SUBCUTANEOUS
  Filled 2023-09-07 (×9): qty 1

## 2023-09-07 MED ORDER — ONDANSETRON HCL 4 MG/2ML IJ SOLN: 4.0000 mg | Freq: Four times a day (QID) | INTRAMUSCULAR | Status: DC | PRN

## 2023-09-07 MED ORDER — IOHEXOL 300 MG/ML  SOLN
100.0000 mL | Freq: Once | INTRAMUSCULAR | Status: AC | PRN
  Administered 2023-09-07: 100 mL via INTRAVENOUS
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
## 2023-09-07 NOTE — ED Provider Notes (Signed)
 [place] EMERGENCY DEPARTMENT AT [facility] Provider Note   CSN: [id number] Arrival date & time: 09/07/23  1022     Patient presents with: Abdominal Pain   Becky [name] is a 63 y.o. female.    Abdominal Pain Associated symptoms: constipation and nausea   Associated symptoms: no chest pain, no chills, no dysuria, no fever, no shortness of breath and no vomiting       Rukiya Hodgkins is a 63 y.o. 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  Diagnosed in May 2025 has underwent radiation therapy and had initial chemotherapy treatment last week.  She presents to the Emergency Department complaining of abdominal cramping and pain of her lower abdomen.  She states that she feels like she is constipated, she took Linzess  and another medication along with magnesium  citrate over the weekend produced a small bowel movement.  Pain did not improve.  Still feels as though she is constipated.  Pain has been associated with nausea but no vomiting, fever or chills.  She also endorses generalized weakness that began 3 to 4 days after her initial chemotherapy treatment and she complains of generalized headache of gradual onset which she states is typical for her.  Chemotherapy is scheduled for every 3 weeks and she is followed at cancer center at [facility].    Prior to Admission medications   Medication Sig Start Date End Date Taking? Authorizing Provider  acetaminophen  (TYLENOL ) 500 MG tablet Take 1,000 mg by mouth every 6 (six) hours as needed for mild pain (pain score 1-3).    [provider]  albuterol  (VENTOLIN  HFA) 108 (90 Base) MCG/ACT inhaler Inhale 2 puffs into the lungs every 4 (four) hours as needed for wheezing or shortness of breath. 12/07/21   Stuart Vernell Norris, PA-C  Ashwagandha  500 MG CHEW Chew 1 Dose by mouth daily. Patient not taking: Reported on 08/13/2023    [provider]  aspirin  EC 81 MG tablet Take 1 tablet (81 mg total) by mouth daily. Swallow whole. 07/25/23   Ricky Fines, MD  azelastine  (ASTELIN ) 0.1 % nasal spray Place 2 sprays into both nostrils 2 (two) times daily. Use in each nostril as directed 07/12/23   Leath-Warren, Etta PARAS, NP  Dandelion Root 525 MG CAPS Take 1 capsule by mouth daily. Patient not taking: Reported on 08/13/2023    [provider]  dexamethasone  (DECADRON ) 4 MG tablet Take 1 tablet (4 mg total) by mouth 2 (two) times daily with a meal. 07/23/23   Shannon Agent, MD  dexlansoprazole  (DEXILANT ) 60 MG capsule Take 1 capsule (60 mg total) by mouth daily. 08/12/23   Shirlean Therisa ORN, NP  diltiazem  (TIADYLT  ER) 360 MG 24 hr capsule TAKE ONE (1) CAPSULE BY MOUTH ONCE DAILY *NEW PRESCRIPTION REQUEST* 08/05/23   Alvan Dorn FALCON, MD  EPINEPHrine  0.3 mg/0.3 mL IJ SOAJ injection Inject 0.3 mg into the muscle once as needed for anaphylaxis. 04/20/18   [provider]  folic acid  (FOLVITE ) 1 MG tablet Take 1 tablet (1 mg total) by mouth daily. 06/18/23   Kandala, Hyndavi, MD  glimepiride (AMARYL) 2 MG tablet Take 2 mg by mouth daily with breakfast.    [provider]  KLOR-CON  M20 20 MEQ tablet Take 20 mEq by mouth daily. [name] takes as needed when she has cramps 08/28/22   [provider]  linaclotide  (LINZESS ) 72 MCG capsule Take 1 capsule (72 mcg total) by mouth daily before breakfast. 05/05/23   Shirlean Therisa ORN, NP  LORazepam  (ATIVAN ) 0.5 MG tablet Take 1 tablet (0.5 mg total) by mouth every 8 (eight) hours as needed for anxiety. Take 1 hour prior to radiation treatment 07/15/23   Shannon Agent, MD  methylPREDNISolone  (MEDROL  DOSEPAK) 4 MG TBPK tablet Use as instructed Patient not taking: Reported on 08/13/2023 08/10/23   Heilingoetter, Cassandra L, PA-C  Misc Natural Products (BEET ROOT) 500 MG CAPS Take 1 capsule by  mouth daily. Patient not taking: Reported on 08/13/2023    [provider]  montelukast  (SINGULAIR ) 10 MG tablet Take 10 mg by mouth at bedtime. 08/25/16   [provider]  NON FORMULARY Pt uses a cpap nightly    [provider]  olmesartan  (BENICAR ) 40 MG tablet Take 1 tablet (40 mg total) by mouth daily. Patient taking differently: Take 20 mg by mouth daily. 08/05/23   Ladona Heinz, MD  ondansetron  (ZOFRAN ) 8 MG tablet TAKE 1 TABLET BY MOUTH EVERY 8 HOURS FOR NAUSEA & VOMITING, START ON THE THIRD DAY AFTER CHEMOTHERAPY 08/08/23   Sherrod Sherrod, MD  ondansetron  (ZOFRAN -ODT) 8 MG disintegrating tablet Take 1 tablet (8 mg total) by mouth every 6 (six) hours as needed for nausea or vomiting. 07/25/23   Ricky Fines, MD  Oregano Oil-Flaxseed Oil (OREGANO OIL IMMUNE SUPPORT) 50-25 MG CAPS Take 1 Dose by mouth daily. Patient not taking: Reported on 08/13/2023    [provider]  OVER THE COUNTER MEDICATION Take 1 capsule by mouth daily. SAFFRON Patient not taking: Reported on 08/13/2023    [provider]  polyethylene glycol (MIRALAX ) 17 g packet Take 17 g by mouth daily. Patient taking differently: Take 17 g by mouth daily. As needed 04/10/23   Maree, Pratik D, DO  prochlorperazine  (COMPAZINE ) 10 MG tablet Take 1 tablet (10 mg total) by mouth every 6 (six) hours as needed. Patient not taking: Reported on 08/13/2023 08/04/23   Heilingoetter, Cassandra L, PA-C  promethazine -dextromethorphan  (PROMETHAZINE -DM) 6.25-15 MG/5ML syrup Take 5 mLs by mouth 4 (four) times daily as needed. Patient not taking: Reported on 08/13/2023 07/12/23   Leath-Warren, Etta PARAS, NP  RESTASIS  0.05 % ophthalmic emulsion Place 1 drop into both eyes 2 (two) times daily. 04/24/23   [provider]  rivaroxaban  (XARELTO ) 20 MG TABS tablet Take 1 tablet (20 mg total) by mouth daily. 08/24/23   Alvan Dorn FALCON, MD  rosuvastatin  (CRESTOR ) 20 MG tablet Take 1 tablet (20 mg total) by mouth daily.  08/05/23   Ladona Heinz, MD  spironolactone  (ALDACTONE ) 25 MG tablet Take 1 tablet (25 mg total) by mouth daily. 08/05/23   Ladona Heinz, MD  SYNTHROID  25 MCG tablet Take 25 mcg by mouth daily. 09/25/22   [provider]  traMADol  (ULTRAM ) 50 MG tablet Take 25 mg by mouth every 6 (six) hours as needed for moderate pain (pain score 4-6). Patient not taking: Reported on 08/13/2023    [provider]  vitamin E 180 MG (400 UNITS) capsule Take 400 Units by mouth daily. Patient not taking: Reported on 08/13/2023    [provider]    Allergies: Apresoline  [hydralazine ], Latex, Other, and Prednisone     Review of Systems  Constitutional:  Positive for appetite change. Negative for chills and fever.  Respiratory:  Negative for shortness of breath.   Cardiovascular:  Negative for chest pain.  Gastrointestinal:  Positive for abdominal  pain, constipation and nausea. Negative for vomiting.  Genitourinary:  Negative for dysuria.  Skin:  Negative for rash.  Neurological:  Positive for weakness and headaches. Negative for dizziness and syncope.    Updated Vital Signs BP 130/77   Pulse 95   Temp 98.6 F (37 C) (Oral)   Resp 18   Ht 5' 7 (1.702 m)   Wt 112 kg   SpO2 100%   BMI 38.69 kg/m   Physical Exam Vitals and nursing note reviewed.  Constitutional:      Appearance: She is not toxic-appearing.  Cardiovascular:     Rate and Rhythm: Normal rate. Rhythm irregular.     Pulses: Normal pulses.  Pulmonary:     Effort: Pulmonary effort is normal. No respiratory distress.  Chest:     Chest wall: No tenderness.  Abdominal:     Palpations: Abdomen is soft.     Tenderness: There is abdominal tenderness.     Comments: Diffuse tenderness lower abdomen.  Abdomen is soft without guarding or rebound tenderness.  Musculoskeletal:        General: Normal range of motion.  Skin:    General: Skin is warm.     Capillary Refill: Capillary refill takes less than 2 seconds.   Neurological:     General: No focal deficit present.     Mental Status: She is alert.     Sensory: No sensory deficit.     Motor: No weakness.     (all labs ordered are listed, but only abnormal results are displayed) Labs Reviewed  COMPREHENSIVE METABOLIC PANEL WITH GFR - Abnormal; Notable for the following components:      Result Value   Sodium 133 (*)    Glucose, Bld 119 (*)    Albumin 3.0 (*)    All other components within normal limits  CBC WITH DIFFERENTIAL/PLATELET - Abnormal; Notable for the following components:   WBC 2.9 (*)    RBC 3.63 (*)    Hemoglobin 11.2 (*)    HCT 32.6 (*)    Platelets 95 (*)    Lymphs Abs 0.3 (*)    All other components within normal limits  PROTIME-INR - Abnormal; Notable for the following components:   Prothrombin Time 15.3 (*)    All other components within normal limits  CULTURE, BLOOD (ROUTINE X 2)  CULTURE, BLOOD (ROUTINE X 2)  LIPASE, BLOOD  LACTIC ACID, PLASMA  LACTIC ACID, PLASMA  URINALYSIS, W/ REFLEX TO CULTURE (INFECTION SUSPECTED)    EKG: EKG Interpretation Date/Time:  Monday September 07 2023 11:37:49 EDT Ventricular Rate:  95 PR Interval:    QRS Duration:  102 QT Interval:  370 QTC Calculation: 430 R Axis:   66  Text Interpretation: Atrial fibrillation Nonspecific T abnormalities, lateral leads Confirmed by Francesca Fallow ([id number]) on 09/07/2023 12:23:05 PM  Radiology: CT ABDOMEN PELVIS W CONTRAST Result Date: 09/07/2023 EXAM: CT ABDOMEN AND PELVIS WITH CONTRAST 09/07/2023 01:04:41 PM TECHNIQUE: CT of the abdomen and pelvis was performed with the administration of intravenous contrast. Multiplanar reformatted images are provided for review. Automated exposure control, iterative reconstruction, and/or weight-based adjustment of the mA/kV was utilized to reduce the radiation dose to as low as reasonably achievable. COMPARISON: CT of the abdomen and pelvis 01/29/2021. CLINICAL HISTORY: Abdominal pain, acute, nonlocalized.  Pt c/o lower abdominal cramping starting on Friday. Pt states she took something to have a BM with success. Pt denies being around anyone sick that she knows of. Pt states nausea no vomiting. Pt  states putting warm compresses on her abdomen yesterday; that helped ease pain some. Pt is stage 4 lung cancer pt that has spread to kidneys and ribs. FINDINGS: LOWER CHEST: Visualized lung bases are clear. LIVER: Diffuse fatty infiltration of the liver is again noted. GALLBLADDER AND BILE DUCTS: No wall thickening. No cholelithiasis. No biliary ductal dilatation. SPLEEN: Normal size. No focal lesion. PANCREAS: No mass. No ductal dilatation. ADRENAL GLANDS: A 15 mm left adrenal lesion is stable. Venous phase imaging average measurement is 53 Hounsfield units. The mean density is 37 Hounsfield units on delayed images. KIDNEYS, URETERS AND BLADDER: A simple cyst at the lower pole of the right kidney measures 2.5 cm. Per consensus, no follow-up is needed for simple Bosniak type 1 and 2 renal cysts, unless the patient has a malignancy history or risk factors. No stones in the kidneys or ureters. No hydronephrosis. No perinephric or periureteral stranding. Urinary bladder is unremarkable. GI AND BOWEL: Diverticular changes are present in the ascending colon without focal inflammation. Diverticular changes are present in the descending and sigmoid colon. Diffuse inflammatory changes are present in the distal sigmoid colon. Likely also free air may be extraluminal. No discrete fluid collection is present. PERITONEUM AND RETROPERITONEUM: No ascites. VASCULATURE: Atherosclerotic calcifications are present in the aorta and branch vessels. No aneurysm is present. LYMPH NODES: No lymphadenopathy. REPRODUCTIVE ORGANS: Fibroid uterus is noted. BONES AND SOFT TISSUES: No acute osseous abnormality. No focal soft tissue abnormality. IMPRESSION: 1. Diverticular changes in the ascending, descending, and sigmoid colon with diffuse inflammatory  changes in the distal sigmoid colon consistent with focal colitis, I will likely related to diverticular disease. Locules of free air may be extraluminal, suggesting a contained perforation. 2. Stable 15 mm left adrenal lesion with venous phase average measurement of 53 Hounsfield units and mean density of 37 Hounsfield units on delayed images. 30% washout is indeterminate. This is a probable adenoma. Given stability of greater than 1 year, no further follow-up imaging is recommended Electronically signed by: Lonni Necessary MD 09/07/2023 01:23 PM EDT RP Workstation: [id number]     Procedures   Medications Ordered in the ED  ondansetron  (ZOFRAN ) injection 4 mg (4 mg Intravenous Given 09/07/23 1232)  acetaminophen  (TYLENOL ) tablet 650 mg (650 mg Oral Given 09/07/23 1230)  lactated ringers  bolus 500 mL (0 mLs Intravenous Stopped 09/07/23 1322)  lidocaine -prilocaine  (EMLA ) cream (1 Application Topical Given 09/07/23 1138)  iohexol  (OMNIPAQUE ) 300 MG/ML solution 100 mL (100 mLs Intravenous Contrast Given 09/07/23 1252)                                    Medical Decision Making Patient with history of non-small cell lung cancer and underwent first chemo treatment last week, here with lower abdominal pain.  Feels as though she is constipated.  Tried Linzess  and magnesium  citrate without relief.  Abdominal pain is associated with nausea without vomiting and she denies any fever or chills.   Lower abdominal pain on exam, differential would include but not limited to worsening of her metastatic disease, SBO, diverticulitis, colitis constipation  Amount and/or Complexity of Data Reviewed Labs: ordered.    Details: Labs show leukopenia and patient undergoing chemotherapy, labs otherwise unremarkable Radiology: ordered.    Details: CT abdomen and pelvis shows diverticular changes in the ascending descending and sigmoid colon.  Locules of free air may be extraluminal suggesting a contained  perforation ECG/medicine tests: ordered.  Details: EKG shows atrial fibrillation with nonspecific T wave abnormalities Discussion of management or test interpretation with external provider(s): Patient with likely diverticulitis possible contained perforation   Consulted with general surgery, Dr. Evonnie who reviewed CT findings and request patient be admitted to medicine service, n.p.o., IV antibiotics and she will see in consultation.  I spoke with pharmacy regarding antibiotic choice, IV Zosyn  recommended.  Discussed with [facility]  hospitalist, Dr. Vicci who agrees to admit  Risk OTC drugs. Prescription drug management. Decision regarding hospitalization.        Final diagnoses:  Diverticulitis of colon with perforation    ED Discharge Orders     None          Herlinda Milling, PA-C 09/07/23 1414    Francesca Elsie CROME, MD 09/10/23 1059

## 2023-09-07 NOTE — ED Triage Notes (Signed)
 Pt c/o lower abdominal cramping starting on Friday. Pt states she took something to have a BM with success. Pt denies being around anyone sick that she knows of. Pt states nausea no vomiting. Pt states putting warm compresses on her abdomen yesterday that helped ease pain some. Pt is stage 4 lung cancer pt that has spread to kidneys and ribs.

## 2023-09-07 NOTE — Plan of Care (Signed)
   Problem: Education: Goal: Knowledge of General Education information will improve Description Including pain rating scale, medication(s)/side effects and non-pharmacologic comfort measures Outcome: Progressing

## 2023-09-07 NOTE — Hospital Course (Signed)
 63 year old female with atrial fibrillation on rivaroxaban , acquired thrombophilia, asthma, coronary artery disease, history of CVA, GERD, goiter, hyperlipidemia, hypertension, hypothyroidism, diabetes mellitus, recently diagnosed with stage IV non-small cell lung cancer adenocarcinoma with metastatic disease including mediastinal adenopathy, metastatic disease to the 6 rib, brain, left adrenal gland.  She completed radiation to the right rib, right lung bronchus on 07/29/2023.  Systemic chemotherapy was postponed due to acute CVA which occurred in early July 2025 and then on 08/10/2023 she was found to have severe thrombocytopenia and subsequently treated for ITP with IVIG.  Her platelets finally recovered and she received her first systemic chemotherapy treatment with carboplatin , Alimta , and Keytruda  08/31/2023.  Her next chemo treatment is scheduled in 3 weeks.  She presented to the emergency department today complaining of lower abdominal cramping that started 4 days ago.  She had no nausea or vomiting.  She had been placing some warm compresses on her abdomen for relief.  She felt like she was constipated and she tried taking Linzess  and mag citrate for relief.  She had a bowel movement but her pain did not improve.  In the ED she was evaluated and a CT abdomen and pelvis showed diverticular changes in the ascending and descending and sigmoid colon.  There were locules of free air suggesting possible contained perforation.  She has been made n.p.o. and her rivaroxaban  is on hold.  Surgery was consulted and recommended starting IV antibiotics IV fluids and keeping n.p.o.  Patient is being admitted into the hospital for further management.

## 2023-09-07 NOTE — Progress Notes (Addendum)
 [facility]  Called by ED provider regarding patient.  She has a past medical history significant for stage IV lung cancer for which she just started receiving chemotherapy this past week.  She initially did well after her chemotherapy, but started having lower abdominal pain over the last day or 2.  She is hemodynamically stable in the ED.  She is leukopenic with a WBC of 2.9.  She underwent a CT of the abdomen and pelvis which is demonstrating diverticular changes in the ascending, descending, and sigmoid colon with inflammatory changes in the distal sigmoid colon, consistent with focal colitis likely related to diverticular disease.  There are locules of possible extraluminal air in this region suggesting a possible contained perforation.  Upon my evaluation of the imaging study, I see a couple of small locules of air, though I am not 100% sure they are extraluminal.  There is no other free air anywhere else in her abdomen.  Would recommend treating the patient conservatively for her diverticulitis.  Admission to the hospitalist service, IV antibiotics, IV fluids, and NPO.  Would hold her Xarelto  at this time in the event she needs some type of surgical intervention during her hospital admission.  Will plan to see the patient in consultation tomorrow.  Please call with any questions or concerns.  Dorothyann Brittle, DO [facility] [address] [place] [address], [place] [address] [phone] (office)

## 2023-09-07 NOTE — ED Notes (Signed)
 Pt requests that phlebotomy draw her labs peripherally and does not want her port accessed until EMLA  cream has taken effect. See MAR for EMLA  application record.

## 2023-09-07 NOTE — Plan of Care (Signed)
  Problem: Education: Goal: Knowledge of General Education information will improve Description: Including pain rating scale, medication(s)/side effects and non-pharmacologic comfort measures 09/07/2023 1752 by Lynnette Cena CROME, RN Outcome: Progressing 09/07/2023 1749 by Lynnette Cena CROME, RN Outcome: Progressing

## 2023-09-07 NOTE — Plan of Care (Signed)

## 2023-09-08 ENCOUNTER — Encounter: Payer: Self-pay | Admitting: Internal Medicine

## 2023-09-08 ENCOUNTER — Other Ambulatory Visit: Payer: Self-pay | Admitting: Oncology

## 2023-09-08 ENCOUNTER — Inpatient Hospital Stay

## 2023-09-08 DIAGNOSIS — I2583 Coronary atherosclerosis due to lipid rich plaque: Secondary | ICD-10-CM

## 2023-09-08 DIAGNOSIS — K5909 Other constipation: Secondary | ICD-10-CM | POA: Diagnosis not present

## 2023-09-08 DIAGNOSIS — E039 Hypothyroidism, unspecified: Secondary | ICD-10-CM

## 2023-09-08 DIAGNOSIS — K5720 Diverticulitis of large intestine with perforation and abscess without bleeding: Secondary | ICD-10-CM | POA: Diagnosis not present

## 2023-09-08 DIAGNOSIS — R1032 Left lower quadrant pain: Secondary | ICD-10-CM

## 2023-09-08 DIAGNOSIS — C3491 Malignant neoplasm of unspecified part of right bronchus or lung: Secondary | ICD-10-CM | POA: Diagnosis not present

## 2023-09-08 DIAGNOSIS — I2510 Atherosclerotic heart disease of native coronary artery without angina pectoris: Secondary | ICD-10-CM | POA: Diagnosis not present

## 2023-09-08 DIAGNOSIS — K279 Peptic ulcer, site unspecified, unspecified as acute or chronic, without hemorrhage or perforation: Secondary | ICD-10-CM

## 2023-09-08 DIAGNOSIS — I482 Chronic atrial fibrillation, unspecified: Secondary | ICD-10-CM | POA: Diagnosis not present

## 2023-09-08 LAB — BASIC METABOLIC PANEL WITH GFR
Anion gap: 8 (ref 5–15)
BUN: 14 mg/dL (ref 8–23)
CO2: 24 mmol/L (ref 22–32)
Calcium: 9.2 mg/dL (ref 8.9–10.3)
Chloride: 100 mmol/L (ref 98–111)
Creatinine, Ser: 0.94 mg/dL (ref 0.44–1.00)
GFR, Estimated: >60 mL/min (ref >60)
Glucose, Bld: 179 mg/dL — ABNORMAL HIGH (ref 70–99)
Potassium: 4.2 mmol/L (ref 3.5–5.1)
Sodium: 132 mmol/L — ABNORMAL LOW (ref 135–145)

## 2023-09-08 LAB — GLUCOSE, CAPILLARY
Glucose-Capillary: 145 mg/dL — ABNORMAL HIGH (ref 70–99)
Glucose-Capillary: 164 mg/dL — ABNORMAL HIGH (ref 70–99)
Glucose-Capillary: 181 mg/dL — ABNORMAL HIGH (ref 70–99)
Glucose-Capillary: 218 mg/dL — ABNORMAL HIGH (ref 70–99)
Glucose-Capillary: 238 mg/dL — ABNORMAL HIGH (ref 70–99)

## 2023-09-08 LAB — CBC
HCT: 28.8 % — ABNORMAL LOW (ref 36.0–46.0)
Hemoglobin: 10.1 g/dL — ABNORMAL LOW (ref 12.0–15.0)
MCH: 31.9 pg (ref 26.0–34.0)
MCHC: 35.1 g/dL (ref 30.0–36.0)
MCV: 90.9 fL (ref 80.0–100.0)
Platelets: 81 K/uL — ABNORMAL LOW (ref 150–400)
RBC: 3.17 MIL/uL — ABNORMAL LOW (ref 3.87–5.11)
RDW: 13.5 % (ref 11.5–15.5)
WBC: 2.3 K/uL — ABNORMAL LOW (ref 4.0–10.5)
nRBC: 0 % (ref 0.0–0.2)

## 2023-09-08 LAB — MAGNESIUM: Magnesium: 1.8 mg/dL (ref 1.7–2.4)

## 2023-09-08 MED ORDER — INSULIN ASPART 100 UNIT/ML IJ SOLN
3.0000 [IU] | Freq: Once | INTRAMUSCULAR | Status: AC
  Administered 2023-09-08: 3 [IU] via SUBCUTANEOUS

## 2023-09-08 MED ORDER — MAGNESIUM SULFATE 2 GM/50ML IV SOLN
2.0000 g | Freq: Once | INTRAVENOUS | Status: AC
  Administered 2023-09-08: 2 g via INTRAVENOUS
  Filled 2023-09-08: qty 50

## 2023-09-08 MED ORDER — INSULIN ASPART 100 UNIT/ML IJ SOLN
0.0000 [IU] | Freq: Three times a day (TID) | INTRAMUSCULAR | Status: DC
  Administered 2023-09-09: 3 [IU] via SUBCUTANEOUS
  Administered 2023-09-09: 2 [IU] via SUBCUTANEOUS
  Administered 2023-09-09 – 2023-09-10 (×2): 3 [IU] via SUBCUTANEOUS
  Administered 2023-09-10: 5 [IU] via SUBCUTANEOUS

## 2023-09-08 MED ORDER — DILTIAZEM HCL ER COATED BEADS 180 MG PO CP24
360.0000 mg | ORAL_CAPSULE | Freq: Every day | ORAL | Status: DC
  Administered 2023-09-09 – 2023-09-10 (×2): 360 mg via ORAL
  Filled 2023-09-08 (×2): qty 2

## 2023-09-08 NOTE — Plan of Care (Signed)
   Problem: Education: Goal: Knowledge of General Education information will improve Description Including pain rating scale, medication(s)/side effects and non-pharmacologic comfort measures Outcome: Progressing   Problem: Health Behavior/Discharge Planning: Goal: Ability to manage health-related needs will improve Outcome: Progressing

## 2023-09-08 NOTE — Consult Note (Signed)
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  carries the risk of check point inhibitor colitis, her presentation doesn't really fit this.   Would recommend treating with IV antibiotics and bowel rest considering question of contained perforation while waiting for general surgery to officially consult on patient. Encouragingly she looks good and has minimal mid low abdominal pain with palpation.   Considering question of contained perforation, we would not scope her at this time. Notably, her colonoscopy is up-to-date as of April 2025.     Plan: Continue IV Zosyn  for now.  Continue bowel rest for now.  Continue supportive measures.  Agree with general surgery consultation which is pending.     LOS: 1 day    09/08/2023, 10:21 AM   Josette Centers, PA-C [facility]

## 2023-09-08 NOTE — Progress Notes (Signed)
 PROGRESS NOTE   Becky [name]  FMW:[id number] DOB: 09/30/1960 DOA: 09/07/2023 PCP: [name], MD   Chief Complaint  Patient presents with   Abdominal Pain   Level of care: Med-Surg  Brief Admission History:  63 year old female with atrial fibrillation on rivaroxaban , acquired thrombophilia, asthma, coronary artery disease, history of CVA, GERD, goiter, hyperlipidemia, hypertension, hypothyroidism, diabetes mellitus, recently diagnosed with stage IV non-small cell lung cancer adenocarcinoma with metastatic disease including mediastinal adenopathy, metastatic disease to the 6 rib, brain, left adrenal gland.  She completed radiation to the right rib, right lung bronchus on 07/29/2023.  Systemic chemotherapy was postponed due to acute CVA which occurred in early July 2025 and then on 08/10/2023 she was found to have severe thrombocytopenia and subsequently treated for ITP with IVIG.  Her platelets finally recovered and she received her first systemic chemotherapy treatment with carboplatin , Alimta , and Keytruda  08/31/2023.  Her next chemo treatment is scheduled in 3 weeks.  She presented to the emergency department today complaining of lower abdominal cramping that started 4 days ago.  She had no nausea or vomiting.  She had been placing some warm compresses on her abdomen for relief.  She felt like she was constipated and she tried taking Linzess  and mag citrate for relief.  She had a bowel movement but her pain did not improve.  In the ED she was evaluated and a CT abdomen and pelvis showed diverticular changes in the ascending and descending and sigmoid colon.  There were locules of free air suggesting possible contained perforation.  She has been made n.p.o. and her rivaroxaban  is on hold.  Surgery was consulted and recommended starting IV antibiotics IV fluids and keeping n.p.o.  Patient is being admitted into the hospital for further management.   Assessment and Plan:  Diverticulitis of  colon with possible contained perforation -- continue IV Zosyn  per pharmacy as ordered -- appreciate surgery team recommendations -- NPO, continue IV fluid hydration, IV pain and nausea medication -- follow blood cultures taken in ED   Pancytopenia -- secondary to recent systemic chemotherapy treatment given 08/31/23.  -- follow CBC daily  -- consulted to hematology   Thrombocytopenia -- possibly from recent chemo but has recent history of ITP requiring IVIG -- monitor daily CBC -- consulted hematology 9/2, notified Dr. 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 -- mild, will follow for now -- hydrating with IV fluid as ordered -- daily BMP ordered   Stage IV non-small cell lung cancer with mets to brain, 6th rib and left adrenal gland -- pt completed radiation treatment on 07/29/23 -- pt recently started systemic chemo on 8/25 -- pt scheduled for more chemo in 3 weeks  -- continue supportive measures -- treating infection aggressively given immunocompromised state   Chronic atrial fibrillation -- resume home diltiazem  CD 360 mg daily  -- holding rivaroxaban  for now in case surgery needed   Essential hypertension  -- resume home medication when home meds reconciled   Type 2 diabetes mellitus -- follow up A1c testing -- while NPO will order SSI coverage every 6 hours with CBG testing   Hypothyroidism -- resume home levothyroxine  when able    DVT prophylaxis: sq heparin  Code Status: full  Family Communication: bedside update  Disposition: anticipate home    Consultants:  Oncology  Procedures:   Antimicrobials:    Subjective: Pt reports that she is feeling better with less abdominal pain today, no fever or chills.  Objective: Vitals:   09/07/23 2104 09/07/23 2327 09/08/23 0031 09/08/23 0326  BP: 118/65 (!) 97/59 110/65 98/60  Pulse: 90 76 (!) 104 87  Resp: 18 18  18   Temp: 99.2 F (37.3 C) 98.7 F (37.1 C)  98.3 F (36.8 C)   TempSrc: Oral Oral  Oral  SpO2: 100% 94%  93%  Weight:      Height:        Intake/Output Summary (Last 24 hours) at 09/08/2023 1103 Last data filed at 09/08/2023 0525 Gross per 24 hour  Intake 713.72 ml  Output 400 ml  Net 313.72 ml   Filed Weights   09/07/23 1031 09/07/23 1456  Weight: 112 kg 110.9 kg   Examination:  General exam: Appears calm and comfortable  Respiratory system: Clear to auscultation. Respiratory effort normal. Cardiovascular system: normal S1 & S2 heard. No JVD, murmurs, rubs, gallops or clicks. No pedal edema. Gastrointestinal system: Abdomen is nondistended, soft and mild LLQ tenderness. No organomegaly or masses felt. Normal bowel sounds heard. Central nervous system: Alert and oriented. No focal neurological deficits. Extremities: Symmetric 5 x 5 power. Skin: No rashes, lesions or ulcers. Psychiatry: Judgement and insight appear normal. Mood & affect appropriate.   Data Reviewed: I have personally reviewed following labs and imaging studies  CBC: Recent Labs  Lab 09/07/23 1155 09/08/23 0407  WBC 2.9* 2.3*  NEUTROABS 2.4  --   HGB 11.2* 10.1*  HCT 32.6* 28.8*  MCV 89.8 90.9  PLT 95* 81*    Basic Metabolic Panel: Recent Labs  Lab 09/07/23 1155 09/08/23 0407  NA 133* 132*  K 4.1 4.2  CL 101 100  CO2 24 24  GLUCOSE 119* 179*  BUN 16 14  CREATININE 0.88 0.94  CALCIUM  9.2 9.2  MG  --  1.8    CBG: Recent Labs  Lab 09/07/23 1812 09/07/23 2327 09/08/23 0025 09/08/23 0504  GLUCAP 114* 120* 145* 181*    Recent Results (from the past 240 hours)  Culture, blood (routine x 2)     Status: None (Preliminary result)   Collection Time: 09/07/23 11:55 AM   Specimen: BLOOD  Result Value Ref Range Status   Specimen Description BLOOD BLOOD RIGHT HAND  Final   Special Requests   Final    BOTTLES DRAWN AEROBIC AND ANAEROBIC Blood Culture adequate volume   Culture   Final    NO GROWTH < 24 HOURS Performed at [facility], [address], [place], [place] [address]    Report Status PENDING  Incomplete  Culture, blood (routine x 2)     Status: None (Preliminary result)   Collection Time: 09/07/23 11:55 AM   Specimen: BLOOD  Result Value Ref Range Status   Specimen Description BLOOD RIGHT ANTECUBITAL  Final   Special Requests   Final    BOTTLES DRAWN AEROBIC AND ANAEROBIC Blood Culture adequate volume   Culture   Final    NO GROWTH < 24 HOURS Performed at [facility], [address], [place], [place] [address]    Report Status PENDING  Incomplete     Radiology Studies: CT ABDOMEN PELVIS W CONTRAST Result Date: 09/07/2023 EXAM: CT ABDOMEN AND PELVIS WITH CONTRAST 09/07/2023 01:04:41 PM TECHNIQUE: CT of the abdomen and pelvis was performed with the administration of intravenous contrast. Multiplanar reformatted images are provided for review. Automated exposure control, iterative reconstruction, and/or weight-based adjustment of the mA/kV was utilized to reduce the radiation dose to as low as reasonably achievable. COMPARISON: CT of  the abdomen and pelvis 01/29/2021. CLINICAL HISTORY: Abdominal pain, acute, nonlocalized. Pt c/o lower abdominal cramping starting on Friday. Pt states she took something to have a BM with success. Pt denies being around anyone sick that she knows of. Pt states nausea no vomiting. Pt states putting warm compresses on her abdomen yesterday; that helped ease pain some. Pt is stage 4 lung cancer pt that has spread to kidneys and ribs. FINDINGS: LOWER CHEST: Visualized lung bases are clear. LIVER: Diffuse fatty infiltration of the liver is again noted. GALLBLADDER AND BILE DUCTS: No wall thickening. No cholelithiasis. No biliary ductal dilatation. SPLEEN: Normal size. No focal lesion. PANCREAS: No mass. No ductal dilatation. ADRENAL GLANDS: A 15 mm left adrenal lesion is stable. Venous phase imaging average measurement is 53 Hounsfield units. The mean density is 37 Hounsfield units on delayed images. KIDNEYS,  URETERS AND BLADDER: A simple cyst at the lower pole of the right kidney measures 2.5 cm. Per consensus, no follow-up is needed for simple Bosniak type 1 and 2 renal cysts, unless the patient has a malignancy history or risk factors. No stones in the kidneys or ureters. No hydronephrosis. No perinephric or periureteral stranding. Urinary bladder is unremarkable. GI AND BOWEL: Diverticular changes are present in the ascending colon without focal inflammation. Diverticular changes are present in the descending and sigmoid colon. Diffuse inflammatory changes are present in the distal sigmoid colon. Likely also free air may be extraluminal. No discrete fluid collection is present. PERITONEUM AND RETROPERITONEUM: No ascites. VASCULATURE: Atherosclerotic calcifications are present in the aorta and branch vessels. No aneurysm is present. LYMPH NODES: No lymphadenopathy. REPRODUCTIVE ORGANS: Fibroid uterus is noted. BONES AND SOFT TISSUES: No acute osseous abnormality. No focal soft tissue abnormality. IMPRESSION: 1. Diverticular changes in the ascending, descending, and sigmoid colon with diffuse inflammatory changes in the distal sigmoid colon consistent with focal colitis, I will likely related to diverticular disease. Locules of free air may be extraluminal, suggesting a contained perforation. 2. Stable 15 mm left adrenal lesion with venous phase average measurement of 53 Hounsfield units and mean density of 37 Hounsfield units on delayed images. 30% washout is indeterminate. This is a probable adenoma. Given stability of greater than 1 year, no further follow-up imaging is recommended Electronically signed by: Lonni Necessary MD 09/07/2023 01:23 PM EDT RP Workstation: [id number]    Scheduled Meds:  aspirin  EC  81 mg Oral Daily   Chlorhexidine  Gluconate Cloth  6 each Topical Daily   dexamethasone  (DECADRON ) injection  4 mg Intravenous Q12H   diltiazem   360 mg Oral Daily   heparin   5,000 Units Subcutaneous  Q8H   insulin  aspart  0-9 Units Subcutaneous Q6H   metoprolol  tartrate  2.5 mg Intravenous Q6H   pantoprazole  (PROTONIX ) IV  40 mg Intravenous Q24H   Continuous Infusions:  lactated ringers  125 mL/hr at 09/08/23 0330   piperacillin -tazobactam (ZOSYN )  IV 3.375 g (09/08/23 0502)     LOS: 1 day   Time spent: 55 mins  Talesha Ellithorpe Vicci, MD How to contact the [facility] Attending or Consulting provider 7A - 7P or covering provider during after hours 7P -7A, for this patient?  Check the care team in [facility] and look for a) attending/consulting [facility] provider listed and b) the [facility] team listed Log into [email or web address] to find provider on call.  Locate the [facility] provider you are looking for under [facility] and page to a number that you can be directly reached. If you still have difficulty reaching the provider,  please page the [facility] (Director on Call) for the Hospitalists listed on amion for assistance.  09/08/2023, 11:03 AM

## 2023-09-08 NOTE — Consult Note (Addendum)
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 temperature 97.9 F (36.6 C), temperature source Oral, resp. rate 19, height 5' 7 (1.702 m), weight 110.9 kg, SpO2 95%. Physical Exam Vitals reviewed.  Constitutional:      General: She is not in acute distress.    Appearance: She is obese. She is not ill-appearing.  HENT:     Head: Normocephalic and atraumatic.     Mouth/Throat:     Mouth: Mucous membranes are moist.     Pharynx: Oropharynx is clear.  Eyes:     Extraocular Movements: Extraocular movements intact.     Pupils: Pupils are equal, round, and reactive to light.  Cardiovascular:     Rate and Rhythm: Normal rate. Rhythm irregular.  Pulmonary:     Effort: Pulmonary effort is normal.     Breath sounds: Normal breath sounds.  Abdominal:     General: Bowel sounds are normal. There is no distension.     Palpations: There is no shifting dullness, fluid wave, hepatomegaly or mass.     Tenderness: There is abdominal  tenderness in the right lower quadrant and left lower quadrant. There is no guarding or rebound.     Hernia: No hernia is present.  Neurological:     Mental Status: She is alert.     Assessment/Plan: Becky [name] is a 63 y/o female with metastatic NSCLC on recent chemo (carboplatin , pemetrexed , pembrolizumab ) presenting with abdominal pain and CT findings of colonic diverticulosis with free air, concerning for contained perforation. Suspect diverticulitis vs. colitis; unclear etiology at this time. Keytruda /pemetrexed -associated colitis also on differential, though oncologic input is warranted.  Plan: - Continue bowel rest (NPO ? cleared liquids today) - IV fluids, IV antibiotics - Hold anticoagulation (rivaroxaban ) - Monitor for clinical stability, signs of worsening or peritonitis - Follow-up with oncology re: possible immunotherapy-related colitis  Ommar Khawaja 09/08/2023, 2:34 PM

## 2023-09-08 NOTE — Plan of Care (Signed)
  Problem: Education: Goal: Knowledge of General Education information will improve Description: Including pain rating scale, medication(s)/side effects and non-pharmacologic comfort measures 09/08/2023 1712 by Lynnette Cena CROME, RN Outcome: Progressing 09/08/2023 1116 by Lynnette Cena CROME, RN Outcome: Progressing   Problem: Health Behavior/Discharge Planning: Goal: Ability to manage health-related needs will improve Outcome: Progressing
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
## 2023-09-08 NOTE — TOC Initial Note (Signed)
 Transition of Care [facility]) - Initial/Assessment Note    Patient Details  Name: Becky [name] MRN: [id number] Date of Birth: [birth year]  Transition of Care [facility]) CM/SW Contact:    Mcarthur Saddie Kim, LCSW Phone Number: 09/08/2023, 7:31 AM  Clinical Narrative: Pt admitted for diverticulitis. Assessment completed due to high risk readmission score. Pt reports she lives with her husband and daughter. She is fairly independent with ADLs. Pt sometimes uses a cane. She is scheduled to start outpatient PT at [facility] on September 8. Her children provide transportation to appointments. No needs reported at this time. TOC will follow.                   Expected Discharge Plan: Home/Self Care Barriers to Discharge: Continued Medical Work up   Patient Goals and CMS Choice Patient states their goals for this hospitalization and ongoing recovery are:: return home   Choice offered to / list presented to : Patient [place] ownership interest in [facility].provided to::  (n/a)    Expected Discharge Plan and Services In-house Referral: Clinical Social Work     Living arrangements for the past 2 months: Single Family Home                                      Prior Living Arrangements/Services Living arrangements for the past 2 months: Single Family Home Lives with:: Spouse, Adult Children Patient language and need for interpreter reviewed:: Yes Do you feel safe going back to the place where you live?: Yes      Need for Family Participation in Patient Care: No (Comment)   Current home services: DME (cane) Criminal Activity/Legal Involvement Pertinent to Current Situation/Hospitalization: No - Comment as needed  Activities of Daily Living   ADL Screening (condition at time of admission) Independently performs ADLs?: Yes (appropriate for developmental age) Is the patient deaf or have difficulty hearing?: No Does the patient have difficulty  seeing, even when wearing glasses/contacts?: No Does the patient have difficulty concentrating, remembering, or making decisions?: No  Permission Sought/Granted                  Emotional Assessment     Affect (typically observed): Appropriate Orientation: : Oriented to Self, Oriented to Place, Oriented to  Time, Oriented to Situation Alcohol / Substance Use: Not Applicable Psych Involvement: No (comment)  Admission diagnosis:  Diverticulitis of colon with perforation [K57.20] Patient Active Problem List   Diagnosis Date Noted   Diverticulitis of colon with perforation 09/07/2023   Leukopenia 09/07/2023   Hyponatremia 09/07/2023   Hypoalbuminemia 09/07/2023   Thrombocytopenia (HCC) 09/07/2023   Idiopathic thrombocytopenic purpura (ITP) (HCC) 08/26/2023   Transient neurologic deficit 07/24/2023   Atrial fibrillation with RVR (HCC) 07/24/2023   Elevated troponin 07/24/2023   History of recent stroke 07/24/2023   Port-A-Cath in place 07/20/2023   Goals of care, counseling/discussion 07/20/2023   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) 07/16/2023   Anxiety 07/15/2023   Acute CVA (cerebrovascular accident) (HCC) 07/12/2023   Obesity, class 2 07/12/2023   Folate deficiency 06/18/2023   Hemoptysis 06/04/2023   Non-small cell carcinoma of lung (HCC) 06/04/2023   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) 06/04/2023   Metastasis to bone (HCC) 06/04/2023   Lung mass 05/27/2023   Hematochezia 04/09/2023   Stroke-like symptoms 04/08/2023   Stroke (HCC) 04/07/2023   Weakness  of right quadriceps muscle 06/05/2022   ASCUS of cervix with negative high risk HPV 04/14/2022   Encounter for screening fecal occult blood testing 04/07/2022   LGSIL on Pap smear of cervix 04/07/2022   Encounter for gynecological examination with Papanicolaou smear of cervix 04/07/2022   S/P total knee arthroplasty, right 01/29/2022   Pain from implanted hardware 11/08/2021   Painful  orthopaedic hardware (HCC) 09/19/2021   Prolapsed internal hemorrhoids, grade 3 05/02/2021   LGSIL of cervix of undetermined significance 03/04/2021   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   Vulvar itching 12/07/2020   Prolapsed internal hemorrhoids, grade 2 10/11/2020   Trochanteric bursitis, right hip 10/04/2020   Trochanteric bursitis, left hip 08/02/2020   Unilateral primary osteoarthritis, left knee 08/02/2020   Hemorrhoids 08/01/2020   GI bleeding 07/18/2020   Normocytic anemia    Shortness of breath    Rectal bleeding 09/17/2018   Vaginal odor 09/24/2017   Abdominal pain 08/18/2017   Nausea without vomiting 11/17/2016   Current use of long term anticoagulation 11/12/2016   Atrial fibrillation, chronic (HCC) 11/07/2016   Coronary artery disease due to lipid rich plaque    RLQ abdominal pain 10/03/2016   Yeast infection 03/13/2016   Fibroids 03/13/2016   Screening for colorectal cancer 11/28/2015   Burning with urination 11/28/2015   Subacute frontal sinusitis 11/28/2015   Leg cramps 10/31/2015   Chest pain 10/31/2015   Varicose 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 07/23/2015   Body aches 11/21/2014   Constipation 11/21/2014   Vaginal itching 03/24/2014   Vaginal discharge 03/24/2014   Pelvic pain 11/02/2013   Elevated cholesterol 11/02/2013   Low back pain 10/20/2013   Stiffness of ankle joint 10/20/2013   Pain in joint, ankle and foot 10/20/2013   Vaginal irritation 05/23/2013   Hematuria 05/23/2013   BV (bacterial vaginosis) 05/23/2013   HEMATOCHEZIA 12/05/2009   Dysphagia 12/05/2009   CHEST WALL PAIN, ANTERIOR 06/23/2007   LEG PAIN 05/18/2007   VAGINAL PRURITUS 04/16/2007   GOITER 03/10/2007   Hypothyroidism 03/10/2007   Type 2 diabetes mellitus with hyperlipidemia (HCC) 03/10/2007   Hyperlipidemia 03/10/2007   ANEMIA-IRON DEFICIENCY 03/10/2007   Essential hypertension 03/10/2007   RHINITIS, CHRONIC 03/10/2007   ASTHMA, CHILDHOOD  03/10/2007   Gastroesophageal reflux disease 03/10/2007   PEPTIC ULCER DISEASE 03/10/2007   MENOPAUSAL SYNDROME 03/10/2007   PCP:  [name], MD Pharmacy:   [facility] [address], [place] - [address] [place] [address] Phone: [phone] Fax: [phone]  [id number] - [place], [place] - [address] [place] [address] Phone: [phone] Fax: [phone]  [facility] - [address] [place] - [address] [place] [address] Phone: [phone] Fax: [phone]  [facility] [place]  - [place], [place] - [address] [place] [address] Phone: [phone] Fax: [phone]  [facility] [address] [place] [address] Phone: [phone] Fax: [phone]     Social Drivers of Health (SDOH) Social History: SDOH Screenings   Food Insecurity: No Food Insecurity (09/07/2023)  Housing: Low Risk  (09/07/2023)  Transportation Needs: No Transportation Needs (09/07/2023)  Utilities: Not At Risk (09/07/2023)  Alcohol Screen: Low Risk  (05/28/2022)  Depression (PHQ2-9): Low Risk  (08/31/2023)  Financial Resource Strain: Low Risk  (03/24/2023)  Physical Activity: Sufficiently Active (08/25/2022)  Social  Connections: Moderately Integrated (04/07/2023)  Stress: No Stress Concern Present (05/28/2022)  Tobacco Use: Medium Risk (09/07/2023)  Health Literacy: Adequate Health Literacy (10/16/2022)   SDOH Interventions:     Readmission Risk Interventions    09/08/2023    7:29 AM 04/08/2023   11:09 AM  Readmission Risk Prevention Plan  Transportation Screening Complete Complete  Home Care Screening  Complete  Medication Review (RN CM)  Complete  Medication Review Oceanographer) Complete   HRI or Home Care Consult Complete   SW Recovery Care/Counseling Consult Complete    Palliative Care Screening Not Applicable   Skilled Nursing Facility Not Applicable

## 2023-09-09 ENCOUNTER — Inpatient Hospital Stay: Admitting: Dietician

## 2023-09-09 ENCOUNTER — Encounter: Payer: Self-pay | Admitting: Internal Medicine

## 2023-09-09 ENCOUNTER — Encounter: Admitting: Dietician

## 2023-09-09 ENCOUNTER — Ambulatory Visit: Admitting: Physician Assistant

## 2023-09-09 ENCOUNTER — Inpatient Hospital Stay

## 2023-09-09 ENCOUNTER — Ambulatory Visit

## 2023-09-09 ENCOUNTER — Telehealth: Payer: Self-pay | Admitting: Gastroenterology

## 2023-09-09 DIAGNOSIS — C3490 Malignant neoplasm of unspecified part of unspecified bronchus or lung: Secondary | ICD-10-CM | POA: Diagnosis not present

## 2023-09-09 DIAGNOSIS — K5900 Constipation, unspecified: Secondary | ICD-10-CM | POA: Diagnosis not present

## 2023-09-09 DIAGNOSIS — K5720 Diverticulitis of large intestine with perforation and abscess without bleeding: Secondary | ICD-10-CM | POA: Diagnosis not present

## 2023-09-09 DIAGNOSIS — C7931 Secondary malignant neoplasm of brain: Secondary | ICD-10-CM | POA: Diagnosis not present

## 2023-09-09 LAB — BASIC METABOLIC PANEL WITH GFR
Anion gap: 11 (ref 5–15)
BUN: 17 mg/dL (ref 8–23)
CO2: 23 mmol/L (ref 22–32)
Calcium: 9.1 mg/dL (ref 8.9–10.3)
Chloride: 98 mmol/L (ref 98–111)
Creatinine, Ser: 1.01 mg/dL — ABNORMAL HIGH (ref 0.44–1.00)
GFR, Estimated: >60 mL/min (ref >60)
Glucose, Bld: 232 mg/dL — ABNORMAL HIGH (ref 70–99)
Potassium: 4.1 mmol/L (ref 3.5–5.1)
Sodium: 132 mmol/L — ABNORMAL LOW (ref 135–145)

## 2023-09-09 LAB — CBC
HCT: 27.4 % — ABNORMAL LOW (ref 36.0–46.0)
Hemoglobin: 9.6 g/dL — ABNORMAL LOW (ref 12.0–15.0)
MCH: 31.2 pg (ref 26.0–34.0)
MCHC: 35 g/dL (ref 30.0–36.0)
MCV: 89 fL (ref 80.0–100.0)
Platelets: 85 K/uL — ABNORMAL LOW (ref 150–400)
RBC: 3.08 MIL/uL — ABNORMAL LOW (ref 3.87–5.11)
RDW: 13.1 % (ref 11.5–15.5)
WBC: 2.5 K/uL — ABNORMAL LOW (ref 4.0–10.5)
nRBC: 0 % (ref 0.0–0.2)

## 2023-09-09 LAB — GLUCOSE, CAPILLARY
Glucose-Capillary: 183 mg/dL — ABNORMAL HIGH (ref 70–99)
Glucose-Capillary: 243 mg/dL — ABNORMAL HIGH (ref 70–99)
Glucose-Capillary: 241 mg/dL — ABNORMAL HIGH (ref 70–99)
Glucose-Capillary: 299 mg/dL — ABNORMAL HIGH (ref 70–99)

## 2023-09-09 LAB — MAGNESIUM: Magnesium: 2.2 mg/dL (ref 1.7–2.4)

## 2023-09-09 MED ORDER — METOPROLOL TARTRATE 25 MG PO TABS
12.5000 mg | ORAL_TABLET | Freq: Two times a day (BID) | ORAL | Status: DC
  Administered 2023-09-09 – 2023-09-10 (×2): 12.5 mg via ORAL
  Filled 2023-09-09 (×2): qty 1

## 2023-09-09 MED ORDER — DEXAMETHASONE 4 MG PO TABS
4.0000 mg | ORAL_TABLET | Freq: Two times a day (BID) | ORAL | Status: DC
  Administered 2023-09-09 – 2023-09-10 (×2): 4 mg via ORAL
  Filled 2023-09-09 (×2): qty 1

## 2023-09-09 MED ORDER — DOCUSATE SODIUM 100 MG PO CAPS
100.0000 mg | ORAL_CAPSULE | Freq: Two times a day (BID) | ORAL | Status: DC
  Administered 2023-09-09 – 2023-09-10 (×3): 100 mg via ORAL
  Filled 2023-09-09 (×3): qty 1

## 2023-09-09 NOTE — Progress Notes (Signed)
 Planning to see patient today for nutrition follow-up. Chart reviewed. Patient currently hospitalized. Nutrition appointment rescheduled for 9/15 during infusion.

## 2023-09-09 NOTE — Plan of Care (Signed)

## 2023-09-09 NOTE — Inpatient Diabetes Management (Signed)
 Inpatient Diabetes Program Recommendations  AACE/ADA: New Consensus Statement on Inpatient Glycemic Control (2015)  Target Ranges:  Prepandial:   less than 140 mg/dL      Peak postprandial:   less than 180 mg/dL (1-2 hours)      Critically ill patients:  140 - 180 mg/dL   Lab Results  Component Value Date   GLUCAP 183 (H) 09/09/2023   HGBA1C 6.0 (H) 07/24/2023    Review of Glycemic Control  Latest Reference Range & Units 09/08/23 05:04 09/08/23 12:04 09/08/23 18:14 09/08/23 21:55 09/09/23 07:36  Glucose-Capillary 70 - 99 mg/dL 818 (H) 835 (H) 761 (H) 218 (H) 183 (H)   Diabetes history: DM 2 Outpatient Diabetes medications: Decadron  4 mg bid, Amaryl 2 mg Daily Current orders for Inpatient glycemic control:  Novolog  0-9 units tid Decadron  4 mg Q12 hours  Inpatient Diabetes Program Recommendations:    -   consider increasing Novolog  Correction to Novolog  0-15 units tid + and add hs scale  Thanks,  Clotilda Bull RN, MSN, BC-ADM Inpatient Diabetes Coordinator Team Pager [phone] (8a-5p)

## 2023-09-09 NOTE — Progress Notes (Addendum)
 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  She completed radiation to the right rib, right lung bronchus on 07/29/2023.  Systemic chemotherapy was postponed due to acute CVA which occurred in early July 2025 and then on 08/10/2023 she was found to have severe thrombocytopenia and subsequently treated for ITP with IVIG.  Her platelets finally recovered and she received her first systemic chemotherapy treatment with carboplatin , Alimta , and Keytruda  08/31/2023.  Her next chemo treatment is scheduled in 3 weeks.  She presented to the emergency department complaining of lower abdominal cramping that started 4 days PTA.  She had no nausea or vomiting.  She had been placing some warm compresses on her abdomen for relief.  She felt like she was constipated and she tried taking Linzess  and mag citrate for relief.  She had a bowel movement but her pain did not improve.  In the ED she was evaluated and a CT abdomen and pelvis showed diverticular changes in the ascending and descending and sigmoid colon.  There were locules of free air suggesting possible contained perforation.  She has been made n.p.o. and her rivaroxaban  is on hold.  Surgery was consulted and recommended starting IV antibiotics IV fluids and keeping n.p.o.  Patient is being admitted into the hospital for further management.   Assessment/Plan: Diverticulitis of colon with perforation -- continue IV Zosyn  -- appreciate surgery team recommendations -- NPO and IVF  initially -- follow blood cultures--neg to date - 09/09/23 advanced to soft diet - low suspicion of immune mediate colitis--appreciate Dr. Davonna   Pancytopenia -- secondary to recent systemic chemotherapy treatment given 08/31/23.  -- drop in platelets due to infection and recent chemo -- appreciate Dr. Davonna   Thrombocytopenia -- possibly from recent chemo but has recent history of ITP requiring IVIG last dose 08/19/23 -- appreciate Dr. Davonna   Hyponatremia -- secondary to volume depletion and poor solute intake -- certainly SIADH can be entertained -- hydrating with IV fluid as ordered -- daily BMP   Stage IV non-small cell lung cancer with mets to brain, 6th rib and left adrenal gland -- pt completed radiation treatment on 07/29/23 -- pt recently started systemic chemo on 08/31/23--carboplatin , pemetrexed  and Keytruda   -- pt scheduled for more chemo in 3 weeks but this will likely be delayed   Chronic atrial fibrillation -- resume home diltiazem  CD 360 mg and metoprolol  -- holding rivaroxaban  for now in case surgery needed -from her last visit with Dr. Ladona on 07/16/23--mets felt to be small and no contraindication for anticoagulation. Reassess candidacy for Watchman device   Essential hypertension  -- resume cardizem  CD and metoprolol    Type 2 diabetes mellitus --07/24/23 A1C--6.0 -novolog  sliding scale   Hypothyroidism -- resume home levothyroxine           Family Communication:   Family at bedside  Consultants:  med onc, GI, general surgery  Code Status:  FULL   DVT Prophylaxis:  Xarelto  on hold   Procedures: As Listed in Progress Note Above  Antibiotics: Zosyn  9/1>>       Subjective: Pt feeling better.  Denies  f/c, cp, sob, n/v/d.  Had one BM yesterday  Objective: Vitals:   09/09/23 0437 09/09/23 0844 09/09/23 1237 09/09/23 1321  BP: 103/67 123/73 134/67 122/74  Pulse: 63 77 72 66  Resp: 20   18  Temp: (!) 97.5 F (36.4 C)      TempSrc: Oral     SpO2: 100%   98%  Weight:      Height:        Intake/Output Summary (Last 24 hours) at 09/09/2023 1440 Last data filed at 09/08/2023 1600 Gross per 24 hour  Intake 290 ml  Output --  Net 290 ml   Weight change:  Exam:  General:  Pt is alert, follows commands appropriately, not in acute distress HEENT: No icterus, No thrush, No neck mass, [place]/AT Cardiovascular: RRR, S1/S2, no rubs, no gallops Respiratory: CTA bilaterally, no wheezing, no crackles, no rhonchi Abdomen: Soft/+BS, RLQ tender, non distended, no guarding Extremities: No edema, No lymphangitis, No petechiae, No rashes, no synovitis   Data Reviewed: I have personally reviewed following labs and imaging studies Basic Metabolic Panel: Recent Labs  Lab 09/07/23 1155 09/08/23 0407 09/09/23 0511  NA 133* 132* 132*  K 4.1 4.2 4.1  CL 101 100 98  CO2 24 24 23   GLUCOSE 119* 179* 232*  BUN 16 14 17   CREATININE 0.88 0.94 1.01*  CALCIUM  9.2 9.2 9.1  MG  --  1.8 2.2   Liver Function Tests: Recent Labs  Lab 09/07/23 1155  AST 27  ALT 34  ALKPHOS 54  BILITOT 0.9  PROT 6.9  ALBUMIN 3.0*   Recent Labs  Lab 09/07/23 1155  LIPASE 33   No results for input(s): AMMONIA in the last 168 hours. Coagulation Profile: Recent Labs  Lab 09/07/23 1155  INR 1.1   CBC: Recent Labs  Lab 09/07/23 1155 09/08/23 0407 09/09/23 0511  WBC 2.9* 2.3* 2.5*  NEUTROABS 2.4  --   --   HGB 11.2* 10.1* 9.6*  HCT 32.6* 28.8* 27.4*  MCV 89.8 90.9 89.0  PLT 95* 81* 85*   Cardiac Enzymes: No results for input(s): CKTOTAL, CKMB, CKMBINDEX, TROPONINI in the last 168 hours. BNP: Invalid input(s): POCBNP CBG: Recent Labs  Lab 09/08/23 1204 09/08/23 1814 09/08/23 2155 09/09/23 0736 09/09/23 1113  GLUCAP 164* 238* 218* 183* 243*   HbA1C: No results for input(s): HGBA1C in the last 72 hours. Urine analysis:    Component Value Date/Time   COLORURINE STRAW (A) 09/07/2023 1726   APPEARANCEUR  CLEAR 09/07/2023 1726   APPEARANCEUR Clear 01/01/2017 1400   LABSPEC 1.021 09/07/2023 1726   PHURINE 7.0 09/07/2023 1726   GLUCOSEU NEGATIVE 09/07/2023 1726   HGBUR MODERATE (A) 09/07/2023 1726   BILIRUBINUR NEGATIVE 09/07/2023 1726   BILIRUBINUR Negative 01/01/2017 1400   KETONESUR NEGATIVE 09/07/2023 1726   PROTEINUR NEGATIVE 09/07/2023 1726   UROBILINOGEN 0.2 01/16/2018 1308   NITRITE NEGATIVE 09/07/2023 1726   LEUKOCYTESUR NEGATIVE 09/07/2023 1726   Sepsis Labs: @LABRCNTIP (procalcitonin:4,lacticidven:4) ) Recent Results (from the past 240 hours)  Culture, blood (routine x 2)     Status: None (Preliminary result)   Collection Time: 09/07/23 11:55 AM   Specimen: BLOOD  Result Value Ref Range Status   Specimen Description   Final    BLOOD BLOOD RIGHT HAND Performed at [facility], [address], [place], [place] [address]    Special Requests   Final    BOTTLES DRAWN AEROBIC AND ANAEROBIC Blood Culture adequate volume Performed at [facility], [address] [place], [place] [address]    Culture   Final    NO GROWTH 2 DAYS Performed at [facility], [address], [place], [place] [address]    Report Status PENDING  Incomplete  Culture, blood (routine x 2)     Status: None (Preliminary result)   Collection Time: 09/07/23 11:55 AM   Specimen: BLOOD  Result Value Ref Range Status   Specimen Description   Final    BLOOD RIGHT ANTECUBITAL Performed at [facility], [address] [place], [place] [address]    Special Requests   Final    BOTTLES DRAWN AEROBIC AND ANAEROBIC Blood Culture adequate volume Performed at [facility], [address], [place], [place] [address]    Culture   Final    NO GROWTH 2 DAYS Performed at [facility], [address] [place], [place] [address]    Report Status PENDING  Incomplete     Scheduled Meds:  aspirin  EC  81 mg Oral Daily   Chlorhexidine  Gluconate Cloth  6 each Topical Daily   dexamethasone  (DECADRON )  injection  4 mg Intravenous Q12H   diltiazem   360 mg Oral Daily   docusate sodium   100 mg Oral BID   heparin   5,000 Units Subcutaneous Q8H   insulin  aspart  0-9 Units Subcutaneous TID WC   metoprolol  tartrate  2.5 mg Intravenous Q6H   pantoprazole  (PROTONIX ) IV  40 mg Intravenous Q24H   Continuous Infusions:  piperacillin -tazobactam (ZOSYN )  IV 3.375 g (09/09/23 1438)    Procedures/Studies: CT ABDOMEN PELVIS W CONTRAST Result Date: 09/07/2023 EXAM: CT ABDOMEN AND PELVIS WITH CONTRAST 09/07/2023 01:04:41 PM TECHNIQUE: CT of the abdomen and pelvis was performed with the administration of intravenous contrast. Multiplanar reformatted images are provided for review. Automated exposure control, iterative reconstruction, and/or weight-based adjustment of the mA/kV was utilized to reduce the radiation dose to as low as reasonably achievable. COMPARISON: CT of the abdomen and pelvis 01/29/2021. CLINICAL HISTORY: Abdominal pain, acute, nonlocalized. Pt c/o lower abdominal cramping starting on Friday. Pt states she took something to have a BM with success. Pt denies being around anyone sick that she knows of. Pt states nausea no vomiting. Pt states putting warm compresses on her abdomen yesterday; that helped ease pain some. Pt is stage 4 lung cancer pt that has spread to kidneys and ribs. FINDINGS: LOWER CHEST: Visualized lung bases are clear. LIVER: Diffuse fatty infiltration of the liver is again noted. GALLBLADDER AND BILE DUCTS: No wall thickening. No cholelithiasis. No biliary ductal dilatation. SPLEEN: Normal size. No focal lesion. PANCREAS: No mass. No ductal dilatation. ADRENAL GLANDS: A 15 mm left adrenal lesion is stable. Venous phase imaging average measurement is 53 Hounsfield units. The mean density is 37 Hounsfield units on delayed images. KIDNEYS, URETERS AND BLADDER: A simple cyst at the lower pole of the right kidney measures 2.5 cm. Per consensus, no follow-up is needed for simple Bosniak type  1 and 2 renal cysts, unless the patient has a malignancy history or risk factors. No stones in the kidneys or ureters. No hydronephrosis. No perinephric or periureteral stranding. Urinary bladder is unremarkable. GI AND BOWEL: Diverticular changes are present in the ascending colon without focal inflammation. Diverticular changes are present in the descending and sigmoid colon. Diffuse inflammatory changes are present in the distal sigmoid colon. Likely also free air may be extraluminal. No discrete fluid collection is present. PERITONEUM AND RETROPERITONEUM: No ascites. VASCULATURE: Atherosclerotic calcifications are present in the aorta  and branch vessels. No aneurysm is present. LYMPH NODES: No lymphadenopathy. REPRODUCTIVE ORGANS: Fibroid uterus is noted. BONES AND SOFT TISSUES: No acute osseous abnormality. No focal soft tissue abnormality. IMPRESSION: 1. Diverticular changes in the ascending, descending, and sigmoid colon with diffuse inflammatory changes in the distal sigmoid colon consistent with focal colitis, I will likely related to diverticular disease. Locules of free air may be extraluminal, suggesting a contained perforation. 2. Stable 15 mm left adrenal lesion with venous phase average measurement of 53 Hounsfield units and mean density of 37 Hounsfield units on delayed images. 30% washout is indeterminate. This is a probable adenoma. Given stability of greater than 1 year, no further follow-up imaging is recommended Electronically signed by: Lonni Necessary MD 09/07/2023 01:23 PM EDT RP Workstation: [id number]    Alm Schneider, DO  [facility]  If 7PM-7AM, please contact night-coverage [email or web address] Password [email or web address] 09/09/2023, 2:40 PM   LOS: 2 days

## 2023-09-09 NOTE — Progress Notes (Addendum)
 I was present with the medical student for this service. I personally verified the history of present illness, performed the physical exam, and made the plan for this encounter. I have verified the medical student's documentation and made modifications where appropriately. I have personally documented in my own words a brief history, physical, and plan below.     Doing well. Less pain. Had Bm.   Zosyn  home with augmentin  tomorrow Soft diet today Colace added  Outpatient CT in 6-8 weeks and likely avoid repeat Colonoscopy unless more symptomatic or recurrence. GI ok with restarting Keytruda  after the CT. She already follows with GI as an outpatient, so will see if they want to order the CT scan or if my office needs to order it.    Manuelita Pander, MD [facility] [address] [place], [place] [address] [phone] (office)     Subjective: Patient doing well, no overnight events.  Today she is feeling much better, abdominal pain improved significantly. Patient had a BM this morning. Patient has been able to keep down clear liquids with no N/V.   Objective: Vital signs in last 24 hours: Temp:  [97.5 F (36.4 C)-97.9 F (36.6 C)] 97.5 F (36.4 C) (09/03 0437) Pulse Rate:  [62-84] 63 (09/03 0437) Resp:  [19-21] 20 (09/03 0437) BP: (81-111)/(67-70) 103/67 (09/03 0437) SpO2:  [95 %-100 %] 100 % (09/03 0437) Last BM Date : 09/07/23  Intake/Output from previous day: 09/02 0701 - 09/03 0700 In: 290 [P.O.:240; IV Piggyback:50] Out: -  Intake/Output this shift: No intake/output data recorded.   Physical Exam Constitutional:      General: She is not in acute distress.    Appearance: She is not ill-appearing.  Cardiovascular:     Rate and Rhythm: Normal rate and regular rhythm.  Pulmonary:     Effort: Pulmonary effort is normal.     Breath sounds: Normal breath sounds.  Abdominal:     General: Bowel sounds are normal. There is no distension.      Palpations: Abdomen is soft. There is no hepatomegaly or mass.     Tenderness: There is abdominal tenderness in the right lower quadrant. There is no guarding or rebound.  Skin:    General: Skin is warm and dry.  Neurological:     Mental Status: She is alert.     Lab Results:  Recent Labs    09/08/23 0407 09/09/23 0511  WBC 2.3* 2.5*  HGB 10.1* 9.6*  HCT 28.8* 27.4*  PLT 81* 85*   BMET Recent Labs    09/08/23 0407 09/09/23 0511  NA 132* 132*  K 4.2 4.1  CL 100 98  CO2 24 23  GLUCOSE 179* 232*  BUN 14 17  CREATININE 0.94 1.01*  CALCIUM  9.2 9.1   PT/INR Recent Labs    09/07/23 1155  LABPROT 15.3*  INR 1.1    Studies/Results: CT ABDOMEN PELVIS W CONTRAST Result Date: 09/07/2023 EXAM: CT ABDOMEN AND PELVIS WITH CONTRAST 09/07/2023 01:04:41 PM TECHNIQUE: CT of the abdomen and pelvis was performed with the administration of intravenous contrast. Multiplanar reformatted images are provided for review. Automated exposure control, iterative reconstruction, and/or weight-based adjustment of the mA/kV was utilized to reduce the radiation dose to as low as reasonably achievable. COMPARISON: CT of the abdomen and pelvis 01/29/2021. CLINICAL HISTORY: Abdominal pain, acute, nonlocalized. Pt c/o lower abdominal cramping starting on Friday. Pt states she took something to have a BM with success. Pt denies being around anyone sick that  she knows of. Pt states nausea no vomiting. Pt states putting warm compresses on her abdomen yesterday; that helped ease pain some. Pt is stage 4 lung cancer pt that has spread to kidneys and ribs. FINDINGS: LOWER CHEST: Visualized lung bases are clear. LIVER: Diffuse fatty infiltration of the liver is again noted. GALLBLADDER AND BILE DUCTS: No wall thickening. No cholelithiasis. No biliary ductal dilatation. SPLEEN: Normal size. No focal lesion. PANCREAS: No mass. No ductal dilatation. ADRENAL GLANDS: A 15 mm left adrenal lesion is stable. Venous phase imaging  average measurement is 53 Hounsfield units. The mean density is 37 Hounsfield units on delayed images. KIDNEYS, URETERS AND BLADDER: A simple cyst at the lower pole of the right kidney measures 2.5 cm. Per consensus, no follow-up is needed for simple Bosniak type 1 and 2 renal cysts, unless the patient has a malignancy history or risk factors. No stones in the kidneys or ureters. No hydronephrosis. No perinephric or periureteral stranding. Urinary bladder is unremarkable. GI AND BOWEL: Diverticular changes are present in the ascending colon without focal inflammation. Diverticular changes are present in the descending and sigmoid colon. Diffuse inflammatory changes are present in the distal sigmoid colon. Likely also free air may be extraluminal. No discrete fluid collection is present. PERITONEUM AND RETROPERITONEUM: No ascites. VASCULATURE: Atherosclerotic calcifications are present in the aorta and branch vessels. No aneurysm is present. LYMPH NODES: No lymphadenopathy. REPRODUCTIVE ORGANS: Fibroid uterus is noted. BONES AND SOFT TISSUES: No acute osseous abnormality. No focal soft tissue abnormality. IMPRESSION: 1. Diverticular changes in the ascending, descending, and sigmoid colon with diffuse inflammatory changes in the distal sigmoid colon consistent with focal colitis, I will likely related to diverticular disease. Locules of free air may be extraluminal, suggesting a contained perforation. 2. Stable 15 mm left adrenal lesion with venous phase average measurement of 53 Hounsfield units and mean density of 37 Hounsfield units on delayed images. 30% washout is indeterminate. This is a probable adenoma. Given stability of greater than 1 year, no further follow-up imaging is recommended Electronically signed by: Lonni Necessary MD 09/07/2023 01:23 PM EDT RP Workstation: [id number]    Anti-infectives: Anti-infectives (From admission, onward)    Start     Dose/Rate Route Frequency Ordered Stop    09/07/23 2200  piperacillin -tazobactam (ZOSYN ) IVPB 3.375 g        3.375 g 12.5 mL/hr over 240 Minutes Intravenous Every 8 hours 09/07/23 1453     09/07/23 1400  piperacillin -tazobactam (ZOSYN ) IVPB 3.375 g        3.375 g 100 mL/hr over 30 Minutes Intravenous  Once 09/07/23 1359 09/07/23 1446       Assessment/Plan: Colitis/diverticulitis with possible contained perforation 55F with metastatic NSCLC admitted with LLQ abdominal pain. CT A/P shows sigmoid diverticulitis with possible contained perforation (locules of free air), no abscess or fluid collection. Pain improving with IV antibiotics. Tolerating clear liquids diet, no N/V.  Afebrile, hemodynamically stable. WBC low (likely chemo-related), but clinically stable.  Plan: - Continue IV antibiotics; advance diet as tolerated. - If stable and tolerates solid foods, plan to transition to oral antibiotics and discharge. - No role for surgery at this time. - Monitor for clinical deterioration (fever, worsening pain, peritonitis). - Outpatient colonoscopy deferred until recovery unless recurrent symptoms.   LOS: 2 days    Ommar [name] 09/09/2023

## 2023-09-09 NOTE — Telephone Encounter (Signed)
 Patient currently admitted to the hospital with diverticulitis versus segmental colitis with contained perforation.  GI signing off today as patient is clinically doing well and will likely be discharged tomorrow.  She needs an office visit in a couple of weeks for follow-up.    Ladonna: Please arrange hospital follow-up with Dr. Cindie, Therisa Stager, or other available APP in 2 weeks.

## 2023-09-09 NOTE — Progress Notes (Addendum)
 Subjective: Reports she is feeling well. No abdominal pain unless someone presses in the RLQ, but still it is very mild. Had a small soft BM this morning without brbpr or melena. No nausea or vomiting. Tolerated clear liquids well. Reports general surgery had advanced her to a soft diet and she is hopeful she can go home tomorrow.   Objective: Vital signs in last 24 hours: Temp:  [97.5 F (36.4 C)-97.9 F (36.6 C)] 97.5 F (36.4 C) (09/03 0437) Pulse Rate:  [62-84] 77 (09/03 0844) Resp:  [19-21] 20 (09/03 0437) BP: (81-123)/(67-73) 123/73 (09/03 0844) SpO2:  [95 %-100 %] 100 % (09/03 0437) Last BM Date : 09/07/23 General:   Alert and oriented, pleasant, NAD.  Head:  Normocephalic and atraumatic. Eyes:  No icterus, sclera clear. Conjuctiva pink.  Abdomen:  Bowel sounds present, soft, non-distended. Very mild TTP in RLQ. No rebound or guarding.  Msk:  Symmetrical without gross deformities. Normal posture. Neurologic:  Alert and  oriented x4;  grossly normal neurologically. Skin:  Warm and dry, intact without significant lesions.  Psych: Normal mood and affect.  Intake/Output from previous day: 09/02 0701 - 09/03 0700 In: 290 [P.O.:240; IV Piggyback:50] Out: -  Intake/Output this shift: No intake/output data recorded.  Lab Results: Recent Labs    09/07/23 1155 09/08/23 0407 09/09/23 0511  WBC 2.9* 2.3* 2.5*  HGB 11.2* 10.1* 9.6*  HCT 32.6* 28.8* 27.4*  PLT 95* 81* 85*   BMET Recent Labs    09/07/23 1155 09/08/23 0407 09/09/23 0511  NA 133* 132* 132*  K 4.1 4.2 4.1  CL 101 100 98  CO2 24 24 23   GLUCOSE 119* 179* 232*  BUN 16 14 17   CREATININE 0.88 0.94 1.01*  CALCIUM  9.2 9.2 9.1   LFT Recent Labs    09/07/23 1155  PROT 6.9  ALBUMIN 3.0*  AST 27  ALT 34  ALKPHOS 54  BILITOT 0.9   PT/INR Recent Labs    09/07/23 1155  LABPROT 15.3*  INR 1.1    Studies/Results: CT ABDOMEN PELVIS W CONTRAST Result Date: 09/07/2023 EXAM: CT ABDOMEN AND PELVIS  WITH CONTRAST 09/07/2023 01:04:41 PM TECHNIQUE: CT of the abdomen and pelvis was performed with the administration of intravenous contrast. Multiplanar reformatted images are provided for review. Automated exposure control, iterative reconstruction, and/or weight-based adjustment of the mA/kV was utilized to reduce the radiation dose to as low as reasonably achievable. COMPARISON: CT of the abdomen and pelvis 01/29/2021. CLINICAL HISTORY: Abdominal pain, acute, nonlocalized. Pt c/o lower abdominal cramping starting on Friday. Pt states she took something to have a BM with success. Pt denies being around anyone sick that she knows of. Pt states nausea no vomiting. Pt states putting warm compresses on her abdomen yesterday; that helped ease pain some. Pt is stage 4 lung cancer pt that has spread to kidneys and ribs. FINDINGS: LOWER CHEST: Visualized lung bases are clear. LIVER: Diffuse fatty infiltration of the liver is again noted. GALLBLADDER AND BILE DUCTS: No wall thickening. No cholelithiasis. No biliary ductal dilatation. SPLEEN: Normal size. No focal lesion. PANCREAS: No mass. No ductal dilatation. ADRENAL GLANDS: A 15 mm left adrenal lesion is stable. Venous phase imaging average measurement is 53 Hounsfield units. The mean density is 37 Hounsfield units on delayed images. KIDNEYS, URETERS AND BLADDER: A simple cyst at the lower pole of the right kidney measures 2.5 cm. Per consensus, no follow-up is needed for simple Bosniak type 1 and 2 renal cysts, unless the  patient has a malignancy history or risk factors. No stones in the kidneys or ureters. No hydronephrosis. No perinephric or periureteral stranding. Urinary bladder is unremarkable. GI AND BOWEL: Diverticular changes are present in the ascending colon without focal inflammation. Diverticular changes are present in the descending and sigmoid colon. Diffuse inflammatory changes are present in the distal sigmoid colon. Likely also free air may be  extraluminal. No discrete fluid collection is present. PERITONEUM AND RETROPERITONEUM: No ascites. VASCULATURE: Atherosclerotic calcifications are present in the aorta and branch vessels. No aneurysm is present. LYMPH NODES: No lymphadenopathy. REPRODUCTIVE ORGANS: Fibroid uterus is noted. BONES AND SOFT TISSUES: No acute osseous abnormality. No focal soft tissue abnormality. IMPRESSION: 1. Diverticular changes in the ascending, descending, and sigmoid colon with diffuse inflammatory changes in the distal sigmoid colon consistent with focal colitis, I will likely related to diverticular disease. Locules of free air may be extraluminal, suggesting a contained perforation. 2. Stable 15 mm left adrenal lesion with venous phase average measurement of 53 Hounsfield units and mean density of 37 Hounsfield units on delayed images. 30% washout is indeterminate. This is a probable adenoma. Given stability of greater than 1 year, no further follow-up imaging is recommended Electronically signed by: Lonni Necessary MD 09/07/2023 01:23 PM EDT RP Workstation: [id number]    Assessment: 63 y.o. year old female  with a history of GERD, dysphagia, chronic constipation, atrial fibrillation on Xarelto , HTN, HLD, hypothyroidism, rectal bleeding due to hemorrhoids with banding in 2023 (right anterior X 3, left lateral, neutral banding), stroke, stage IV right non-small cell lung cancer with metastatic disease including 6 rib, brain, left adrenal gland s/p radiation ending 07/29/2023 and started on palliative systemic chemotherapy and immunotherapy with  Keytruda , Alimta , Carboplatin  with first infusion on 08/31/2023 who presented to the ED 09/07/2023 with complaint of lower abdominal cramping since 8/29.  CT A/P with contrast with concerns for diverticulitis versus colitis with possible contained perforation.  GI and general surgery consulted.   Diverticulitis versus colitis with possible contained perforation:  Presentation  seems more consistent with acute diverticulitis with contained perforation that happened to coincide with recently starting chemotherapy/immunotherapy, but unable to rule out presentation secondary to Keytruda .  However, I feel that patient's presentation is less consistent with this as she never had diarrhea to suggest true checkpoint inhibitor colitis.   Due to contained perforation, unable to proceed with colonoscopy at this time, and it is unlikely that this would provide significant benefit as she had a colonoscopy in April 2025 unless she has persistent symptoms.  Clinically, she is doing very well with minimal lower abdominal pain only with palpation of the right lower quadrant.  She has been receiving IV Zosyn  since admission and is tolerating clear liquids just fine.  General surgery has advance her to a soft diet today with plans for discharge tomorrow on Augmentin  as long as she tolerates this well.  Recommendation at this time would be to repeat a CT of the abdomen and pelvis with IV contrast in 6 to 8 weeks.  If contained perforation has resolved, patient can restart Keytruda .  However, if persistent symptoms, would recommend proceeding with outpatient colonoscopy at that point.  Chronic constipation:  Linzess  on hold in the setting of contained perforation. Colace added today per general surgery.   Plan: Diet advancement per general surgery- soft diet today. Continue IV Zosyn .  Agree with colace to keep bowls soft in the setting of chronic constipation as Linzess  is currently on hold.  If patient  tolerates soft diet, surgery plans to discharge tomorrow with Augmentin .  Outpatient CT A/P with contrast in 6-8 weeks. If perforation has healed, she can resume Keytruda . If persistent symptoms, will need colonoscopy.  Continue following closely with oncology outpatient.  GI will sign off and arrange outpatient follow-up in about 2 weeks.    LOS: 2 days    09/09/2023, 11:44 AM   Josette Centers, PA-C [facility]

## 2023-09-10 ENCOUNTER — Telehealth: Payer: Self-pay | Admitting: Medical Oncology

## 2023-09-10 ENCOUNTER — Ambulatory Visit: Admitting: Family Medicine

## 2023-09-10 DIAGNOSIS — D696 Thrombocytopenia, unspecified: Secondary | ICD-10-CM | POA: Diagnosis not present

## 2023-09-10 DIAGNOSIS — K5720 Diverticulitis of large intestine with perforation and abscess without bleeding: Secondary | ICD-10-CM | POA: Diagnosis not present

## 2023-09-10 DIAGNOSIS — C3490 Malignant neoplasm of unspecified part of unspecified bronchus or lung: Secondary | ICD-10-CM | POA: Diagnosis not present

## 2023-09-10 DIAGNOSIS — E871 Hypo-osmolality and hyponatremia: Secondary | ICD-10-CM | POA: Diagnosis not present

## 2023-09-10 LAB — BASIC METABOLIC PANEL WITH GFR
Anion gap: 8 (ref 5–15)
BUN: 21 mg/dL (ref 8–23)
CO2: 24 mmol/L (ref 22–32)
Calcium: 8.8 mg/dL — ABNORMAL LOW (ref 8.9–10.3)
Chloride: 98 mmol/L (ref 98–111)
Creatinine, Ser: 0.93 mg/dL (ref 0.44–1.00)
GFR, Estimated: >60 mL/min (ref >60)
Glucose, Bld: 303 mg/dL — ABNORMAL HIGH (ref 70–99)
Potassium: 4 mmol/L (ref 3.5–5.1)
Sodium: 130 mmol/L — ABNORMAL LOW (ref 135–145)

## 2023-09-10 LAB — GLUCOSE, CAPILLARY
Glucose-Capillary: 278 mg/dL — ABNORMAL HIGH (ref 70–99)
Glucose-Capillary: 228 mg/dL — ABNORMAL HIGH (ref 70–99)

## 2023-09-10 LAB — CBC WITH DIFFERENTIAL/PLATELET
Abs Immature Granulocytes: 0.02 K/uL (ref 0.00–0.07)
Basophils Absolute: 0 K/uL (ref 0.0–0.1)
Basophils Relative: 0 %
Eosinophils Absolute: 0 K/uL (ref 0.0–0.5)
Eosinophils Relative: 0 %
HCT: 26.6 % — ABNORMAL LOW (ref 36.0–46.0)
Hemoglobin: 9.2 g/dL — ABNORMAL LOW (ref 12.0–15.0)
Immature Granulocytes: 1 %
Lymphocytes Relative: 7 %
Lymphs Abs: 0.2 K/uL — ABNORMAL LOW (ref 0.7–4.0)
MCH: 31.3 pg (ref 26.0–34.0)
MCHC: 34.6 g/dL (ref 30.0–36.0)
MCV: 90.5 fL (ref 80.0–100.0)
Monocytes Absolute: 0.3 K/uL (ref 0.1–1.0)
Monocytes Relative: 13 %
Neutro Abs: 2.1 K/uL (ref 1.7–7.7)
Neutrophils Relative %: 79 %
Platelets: 85 K/uL — ABNORMAL LOW (ref 150–400)
RBC: 2.94 MIL/uL — ABNORMAL LOW (ref 3.87–5.11)
RDW: 13.1 % (ref 11.5–15.5)
WBC: 2.7 K/uL — ABNORMAL LOW (ref 4.0–10.5)
nRBC: 0 % (ref 0.0–0.2)

## 2023-09-10 LAB — MAGNESIUM: Magnesium: 2 mg/dL (ref 1.7–2.4)

## 2023-09-10 MED ORDER — AMOXICILLIN-POT CLAVULANATE 875-125 MG PO TABS: 1.0000 | ORAL_TABLET | Freq: Two times a day (BID) | ORAL | 0 refills | Status: DC

## 2023-09-10 MED ORDER — DEXAMETHASONE 4 MG PO TABS: 4.0000 mg | ORAL_TABLET | Freq: Two times a day (BID) | ORAL | Status: DC

## 2023-09-10 MED ORDER — HEPARIN SOD (PORK) LOCK FLUSH 100 UNIT/ML IV SOLN
500.0000 [IU] | INTRAVENOUS | Status: AC | PRN
  Administered 2023-09-10: 500 [IU]
  Filled 2023-09-10: qty 5

## 2023-09-10 MED ORDER — PANTOPRAZOLE SODIUM 40 MG PO TBEC: 40.0000 mg | DELAYED_RELEASE_TABLET | Freq: Every day | ORAL | Status: DC

## 2023-09-10 MED ORDER — AMOXICILLIN-POT CLAVULANATE 875-125 MG PO TABS
1.0000 | ORAL_TABLET | Freq: Two times a day (BID) | ORAL | Status: DC
  Administered 2023-09-10: 1 via ORAL
  Filled 2023-09-10: qty 1

## 2023-09-10 NOTE — Progress Notes (Signed)
 Patient discharged home with instructions given on medications and follow up visits,patient and family verbalized understanding.Prescriptions sent to Pharmacy of choice documented on AVS. Port-a-cath de-accessed,no bleeding,redness or swelling noted.Patient tolerated port-a-cath removal,dressing appiled to site.Accompanied by staff to an awaiting vehicle.

## 2023-09-10 NOTE — Progress Notes (Signed)
  Subjective: Patient doing well, no overnight events. Pain improving from initial presentation but stable from yesterday. Pain still localized to the RLQ only on palpation, no rebound or guarding. Patient tolerating soft diet well, no N/V, no constipation/diarrhea, no hematochezia. She had 2 soft BMs yesterday.   Objective: Vital signs in last 24 hours: Temp:  [98.3 F (36.8 C)] 98.3 F (36.8 C) (09/04 0302) Pulse Rate:  [62-80] 62 (09/04 0302) Resp:  [18] 18 (09/04 0302) BP: (121-134)/(67-81) 121/81 (09/04 0302) SpO2:  [98 %-100 %] 100 % (09/04 0302) Last BM Date : 09/07/23  Intake/Output from previous day: 09/03 0701 - 09/04 0700 In: 1320 [P.O.:1320] Out: -  Intake/Output this shift: No intake/output data recorded.   Physical Exam HENT:     Head: Normocephalic and atraumatic.     Mouth/Throat:     Mouth: Mucous membranes are moist.     Pharynx: Oropharynx is clear.  Eyes:     Extraocular Movements: Extraocular movements intact.     Pupils: Pupils are equal, round, and reactive to light.  Cardiovascular:     Rate and Rhythm: Normal rate and regular rhythm.  Pulmonary:     Effort: Pulmonary effort is normal.     Breath sounds: Normal breath sounds.  Abdominal:     General: Bowel sounds are normal. There is no distension.     Tenderness: There is abdominal tenderness in the right lower quadrant. There is no guarding or rebound.  Skin:    General: Skin is warm and dry.  Neurological:     Mental Status: She is alert.     Lab Results:  Recent Labs    09/09/23 0511 09/10/23 0413  WBC 2.5* 2.7*  HGB 9.6* 9.2*  HCT 27.4* 26.6*  PLT 85* 85*   BMET Recent Labs    09/09/23 0511 09/10/23 0413  NA 132* 130*  K 4.1 4.0  CL 98 98  CO2 23 24  GLUCOSE 232* 303*  BUN 17 21  CREATININE 1.01* 0.93  CALCIUM  9.1 8.8*   PT/INR Recent Labs    09/07/23 1155  LABPROT 15.3*  INR 1.1    Studies/Results: No results found.  Anti-infectives: Anti-infectives  (From admission, onward)    Start     Dose/Rate Route Frequency Ordered Stop   09/07/23 2200  piperacillin -tazobactam (ZOSYN ) IVPB 3.375 g        3.375 g 12.5 mL/hr over 240 Minutes Intravenous Every 8 hours 09/07/23 1453     09/07/23 1400  piperacillin -tazobactam (ZOSYN ) IVPB 3.375 g        3.375 g 100 mL/hr over 30 Minutes Intravenous  Once 09/07/23 1359 09/07/23 1446       Assessment/Plan: - Continue soft diet - Continue Colace - Continue IV Zosyn , discharge on augmentin  - Repeat CT abdomen/pelvis with IV contrast in 6 to 8 weeks - If contained perforation has resolved, patient can restart Keytruda .If symptoms persist then proceed with outpatient colonoscopy.  - Follow up with Oncology outpatient - Discharge today and follow up with GI outpatient    LOS: 3 days    Janis Cuffe [facility] 09/10/2023

## 2023-09-10 NOTE — Telephone Encounter (Signed)
 Pt t hinks she may be discharged home today . Pt confirmed next appts.

## 2023-09-10 NOTE — Discharge Summary (Addendum)
 Physician Discharge Summary   [name] MRN: [id number] DOB: 08-30-1960  Admit date:     09/07/2023  Discharge date: 09/10/23  Discharge Physician: Alm Noeli Lavery   PCP: [name], MD   Recommendations at discharge:   Please follow up with primary care provider within 1-2 weeks  Please repeat BMP and CBC in one week    Hospital Course: 63 year old female with atrial fibrillation on rivaroxaban , acquired thrombophilia, asthma, coronary artery disease, history of CVA, GERD, goiter, hyperlipidemia, hypertension, hypothyroidism, diabetes mellitus, recently diagnosed with stage IV non-small cell lung cancer adenocarcinoma with metastatic disease including mediastinal adenopathy, metastatic disease to the 6 rib, brain, left adrenal gland.  She completed radiation to the right rib, right lung bronchus on 07/29/2023.  Systemic chemotherapy was postponed due to acute CVA which occurred in early July 2025 and then on 08/10/2023 she was found to have severe thrombocytopenia and subsequently treated for ITP with IVIG.  Her platelets finally recovered and she received her first systemic chemotherapy treatment with carboplatin , Alimta , and Keytruda  08/31/2023.  Her next chemo treatment is scheduled in 3 weeks.  She presented to the emergency department complaining of lower abdominal cramping that started 4 days PTA.  She had no nausea or vomiting.  She had been placing some warm compresses on her abdomen for relief.  She felt like she was constipated and she tried taking Linzess  and mag citrate for relief.  She had a bowel movement but her pain did not improve.  In the ED she was evaluated and a CT abdomen and pelvis showed diverticular changes in the ascending and descending and sigmoid colon.  There were locules of free air suggesting possible contained perforation.  She has been made n.p.o. and her rivaroxaban  is on hold.  Surgery was consulted and recommended starting IV antibiotics IV fluids  and keeping n.p.o.  Patient is being admitted into the hospital for further management.  Assessment and Plan: Diverticulitis of colon with perforation -- continue IV Zosyn  -- appreciate surgery team recommendations -- NPO and IVF initially -- follow blood cultures--neg to date - 09/09/23 advanced to soft diet - low suspicion of immune mediate colitis--appreciate Dr. Davonna - diet gradually advanced to soft diet which pt tolerated on 9/4 and cleared for d/c by general surgery - d/c home with amox/clav x 7 more days to complete 10 days   Pancytopenia -- secondary to recent systemic chemotherapy treatment given 08/31/23.  -- drop in platelets due to infection and recent chemo -- appreciate Dr. Davonna - platelets recovering with treatment of infection - Hgb stable, WBC improving   Thrombocytopenia -- possibly from recent chemo but has recent history of ITP requiring IVIG last dose 08/19/23 -- appreciate Dr. Davonna - platelets recovering with treatment of infection   Hyponatremia -- secondary to volume depletion and poor solute intake -- certainly SIADH can be entertained -- hydrating with IV fluid as ordered -- Na remains stable - repeat BMP one week after d/c   Stage IV non-small cell lung cancer with mets to brain, 6th rib and left adrenal gland -- pt completed radiation treatment on 07/29/23 -- pt recently started systemic chemo on 08/31/23--carboplatin , pemetrexed  and Keytruda   -- pt scheduled for more chemo in 3 weeks but this will likely be delayed - follow up with Dr. Sherrod after dc   Chronic atrial fibrillation -- resume home diltiazem  CD 360 mg and metoprolol  -- holding rivaroxaban  for now in case surgery needed -from her last visit with Dr. Ladona  on 07/16/23--mets felt to be small and no contraindication for anticoagulation. Reassess candidacy for Watchman device   Essential hypertension  -- resume cardizem  CD and metoprolol    Type 2 diabetes mellitus --07/24/23  A1C--6.0 -novolog  sliding scale   Hypothyroidism -- resume home levothyroxine        Consultants: general surgery, GI Procedures performed: none  Disposition: Home Diet recommendation:  Soft diet DISCHARGE MEDICATION: Allergies as of 09/10/2023       Reactions   Apresoline  [hydralazine ] Nausea Only   Latex Other (See Comments)   Unknown    Other Other (See Comments)   Band aids discolor skin EKG pads irritate skin    Prednisone  Swelling   Patient states that she can take methylprednisolone  without complications She has tolerated PO dexamethasone  well. No complaints of swelling voiced from patient.        Medication List     TAKE these medications    acetaminophen  500 MG tablet Commonly known as: TYLENOL  Take 1,000 mg by mouth every 6 (six) hours as needed for mild pain (pain score 1-3).   albuterol  108 (90 Base) MCG/ACT inhaler Commonly known as: VENTOLIN  HFA Inhale 2 puffs into the lungs every 4 (four) hours as needed for wheezing or shortness of breath.   amoxicillin -clavulanate 875-125 MG tablet Commonly known as: AUGMENTIN  Take 1 tablet by mouth every 12 (twelve) hours.   aspirin  EC 81 MG tablet Take 1 tablet (81 mg total) by mouth daily. Swallow whole.   azelastine  0.1 % nasal spray Commonly known as: ASTELIN  Place 2 sprays into both nostrils 2 (two) times daily. Use in each nostril as directed What changed:  when to take this reasons to take this   dexamethasone  4 MG tablet Commonly known as: DECADRON  Take 1 tablet (4 mg total) by mouth 2 (two) times daily with a meal. Follow weaning instructions as directed by Dr. Shannon What changed: additional instructions   dexlansoprazole  60 MG capsule Commonly known as: DEXILANT  Take 1 capsule (60 mg total) by mouth daily.   EPINEPHrine  0.3 mg/0.3 mL Soaj injection Commonly known as: EPI-PEN Inject 0.3 mg into the muscle once as needed for anaphylaxis.   folic acid  1 MG tablet Commonly known as:  FOLVITE  Take 1 tablet (1 mg total) by mouth daily.   glimepiride 2 MG tablet Commonly known as: AMARYL Take 2 mg by mouth daily with breakfast.   Klor-Con  M20 20 MEQ tablet Generic drug: potassium chloride  SA Take 20 mEq by mouth daily. [name] takes as needed when she has cramps What changed:  when to take this reasons to take this additional instructions   lidocaine -prilocaine  cream Commonly known as: EMLA  Apply 1 Application topically as needed (port access).   linaclotide  72 MCG capsule Commonly known as: Linzess  Take 1 capsule (72 mcg total) by mouth daily before breakfast.   LORazepam  0.5 MG tablet Commonly known as: ATIVAN  Take 1 tablet (0.5 mg total) by mouth every 8 (eight) hours as needed for anxiety. Take 1 hour prior to radiation treatment   metoprolol  tartrate 25 MG tablet Commonly known as: LOPRESSOR  Take 12.5 mg by mouth 2 (two) times daily.   montelukast  10 MG tablet Commonly known as: SINGULAIR  Take 10 mg by mouth at bedtime.   NON FORMULARY Pt uses a cpap nightly   olmesartan  40 MG tablet Commonly known as: Benicar  Take 1 tablet (40 mg total) by mouth daily. What changed: how much to take   ondansetron  8 MG tablet Commonly known as: ZOFRAN  TAKE  1 TABLET BY MOUTH EVERY 8 HOURS FOR NAUSEA & VOMITING, START ON THE THIRD DAY AFTER CHEMOTHERAPY What changed: See the new instructions.   polyethylene glycol 17 g packet Commonly known as: MiraLax  Take 17 g by mouth daily. What changed:  when to take this reasons to take this additional instructions   prochlorperazine  10 MG tablet Commonly known as: COMPAZINE  Take 1 tablet (10 mg total) by mouth every 6 (six) hours as needed. What changed: reasons to take this   promethazine -dextromethorphan  6.25-15 MG/5ML syrup Commonly known as: PROMETHAZINE -DM Take 5 mLs by mouth 4 (four) times daily as needed. What changed: reasons to take this   Restasis  0.05 % ophthalmic emulsion Generic drug:  cycloSPORINE  Place 1 drop into both eyes daily as needed (dry eyes).   rivaroxaban  20 MG Tabs tablet Commonly known as: Xarelto  Take 1 tablet (20 mg total) by mouth daily.   rosuvastatin  20 MG tablet Commonly known as: CRESTOR  Take 1 tablet (20 mg total) by mouth daily.   Senexon-S 8.6-50 MG tablet Generic drug: senna-docusate Take 1 tablet by mouth at bedtime as needed for mild constipation.   Synthroid  25 MCG tablet Generic drug: levothyroxine  Take 25 mcg by mouth daily before breakfast.   Tiadylt  ER 360 MG 24 hr capsule Generic drug: diltiazem  TAKE ONE (1) CAPSULE BY MOUTH ONCE DAILY *NEW PRESCRIPTION REQUEST*        Discharge Exam: Filed Weights   09/07/23 1031 09/07/23 1456  Weight: 112 kg 110.9 kg   HEENT:  [place]/AT, No thrush, no icterus CV:  RRR, no rub, no S3, no S4 Lung:  CTA, no wheeze, no rhonchi Abd:  soft/+BS, mild RLQ tenderness Ext:  No edema, no lymphangitis, no synovitis, no rash Port-a-cath site--no edema, no erythema, no tenderness to palpation, no drainage   Condition at discharge: stable  The results of significant diagnostics from this hospitalization (including imaging, microbiology, ancillary and laboratory) are listed below for reference.   Imaging Studies: CT ABDOMEN PELVIS W CONTRAST Result Date: 09/07/2023 EXAM: CT ABDOMEN AND PELVIS WITH CONTRAST 09/07/2023 01:04:41 PM TECHNIQUE: CT of the abdomen and pelvis was performed with the administration of intravenous contrast. Multiplanar reformatted images are provided for review. Automated exposure control, iterative reconstruction, and/or weight-based adjustment of the mA/kV was utilized to reduce the radiation dose to as low as reasonably achievable. COMPARISON: CT of the abdomen and pelvis 01/29/2021. CLINICAL HISTORY: Abdominal pain, acute, nonlocalized. Pt c/o lower abdominal cramping starting on Friday. Pt states she took something to have a BM with success. Pt denies being around anyone sick  that she knows of. Pt states nausea no vomiting. Pt states putting warm compresses on her abdomen yesterday; that helped ease pain some. Pt is stage 4 lung cancer pt that has spread to kidneys and ribs. FINDINGS: LOWER CHEST: Visualized lung bases are clear. LIVER: Diffuse fatty infiltration of the liver is again noted. GALLBLADDER AND BILE DUCTS: No wall thickening. No cholelithiasis. No biliary ductal dilatation. SPLEEN: Normal size. No focal lesion. PANCREAS: No mass. No ductal dilatation. ADRENAL GLANDS: A 15 mm left adrenal lesion is stable. Venous phase imaging average measurement is 53 Hounsfield units. The mean density is 37 Hounsfield units on delayed images. KIDNEYS, URETERS AND BLADDER: A simple cyst at the lower pole of the right kidney measures 2.5 cm. Per consensus, no follow-up is needed for simple Bosniak type 1 and 2 renal cysts, unless the patient has a malignancy history or risk factors. No stones in the kidneys or  ureters. No hydronephrosis. No perinephric or periureteral stranding. Urinary bladder is unremarkable. GI AND BOWEL: Diverticular changes are present in the ascending colon without focal inflammation. Diverticular changes are present in the descending and sigmoid colon. Diffuse inflammatory changes are present in the distal sigmoid colon. Likely also free air may be extraluminal. No discrete fluid collection is present. PERITONEUM AND RETROPERITONEUM: No ascites. VASCULATURE: Atherosclerotic calcifications are present in the aorta and branch vessels. No aneurysm is present. LYMPH NODES: No lymphadenopathy. REPRODUCTIVE ORGANS: Fibroid uterus is noted. BONES AND SOFT TISSUES: No acute osseous abnormality. No focal soft tissue abnormality. IMPRESSION: 1. Diverticular changes in the ascending, descending, and sigmoid colon with diffuse inflammatory changes in the distal sigmoid colon consistent with focal colitis, I will likely related to diverticular disease. Locules of free air may be  extraluminal, suggesting a contained perforation. 2. Stable 15 mm left adrenal lesion with venous phase average measurement of 53 Hounsfield units and mean density of 37 Hounsfield units on delayed images. 30% washout is indeterminate. This is a probable adenoma. Given stability of greater than 1 year, no further follow-up imaging is recommended Electronically signed by: Lonni Necessary MD 09/07/2023 01:23 PM EDT RP Workstation: [id number]    Microbiology: Results for orders placed or performed during the hospital encounter of 09/07/23  Culture, blood (routine x 2)     Status: None (Preliminary result)   Collection Time: 09/07/23 11:55 AM   Specimen: BLOOD  Result Value Ref Range Status   Specimen Description BLOOD BLOOD RIGHT HAND  Final   Special Requests   Final    BOTTLES DRAWN AEROBIC AND ANAEROBIC Blood Culture adequate volume   Culture   Final    NO GROWTH 3 DAYS Performed at [facility], [address] [place], [place] [address]    Report Status PENDING  Incomplete  Culture, blood (routine x 2)     Status: None (Preliminary result)   Collection Time: 09/07/23 11:55 AM   Specimen: BLOOD  Result Value Ref Range Status   Specimen Description BLOOD RIGHT ANTECUBITAL  Final   Special Requests   Final    BOTTLES DRAWN AEROBIC AND ANAEROBIC Blood Culture adequate volume   Culture   Final    NO GROWTH 3 DAYS Performed at [facility], [address], [place], [place] [address]    Report Status PENDING  Incomplete   *Note: Due to a large number of results and/or encounters for the requested time period, some results have not been displayed. A complete set of results can be found in Results Review.    Labs: CBC: Recent Labs  Lab 09/07/23 1155 09/08/23 0407 09/09/23 0511 09/10/23 0413  WBC 2.9* 2.3* 2.5* 2.7*  NEUTROABS 2.4  --   --  2.1  HGB 11.2* 10.1* 9.6* 9.2*  HCT 32.6* 28.8* 27.4* 26.6*  MCV 89.8 90.9 89.0 90.5  PLT 95* 81* 85* 85*   Basic Metabolic  Panel: Recent Labs  Lab 09/07/23 1155 09/08/23 0407 09/09/23 0511 09/10/23 0413  NA 133* 132* 132* 130*  K 4.1 4.2 4.1 4.0  CL 101 100 98 98  CO2 24 24 23 24   GLUCOSE 119* 179* 232* 303*  BUN 16 14 17 21   CREATININE 0.88 0.94 1.01* 0.93  CALCIUM  9.2 9.2 9.1 8.8*  MG  --  1.8 2.2 2.0   Liver Function Tests: Recent Labs  Lab 09/07/23 1155  AST 27  ALT 34  ALKPHOS 54  BILITOT 0.9  PROT 6.9  ALBUMIN 3.0*  CBG: Recent Labs  Lab 09/09/23 1113 09/09/23 1607 09/09/23 2108 09/10/23 0725 09/10/23 1130  GLUCAP 243* 241* 299* 278* 228*    Discharge time spent: greater than 30 minutes.  Signed: Alm Schneider, MD [facility] 09/10/2023

## 2023-09-11 NOTE — Progress Notes (Signed)
 Hi Diane,  Ok to change coding for [id number]. Thanks

## 2023-09-12 LAB — CULTURE, BLOOD (ROUTINE X 2)
Culture: NO GROWTH
Culture: NO GROWTH
Special Requests: ADEQUATE
Special Requests: ADEQUATE

## 2023-09-13 ENCOUNTER — Other Ambulatory Visit: Payer: Self-pay | Admitting: Internal Medicine

## 2023-09-14 ENCOUNTER — Ambulatory Visit (HOSPITAL_COMMUNITY)

## 2023-09-14 ENCOUNTER — Inpatient Hospital Stay: Attending: Physician Assistant

## 2023-09-14 ENCOUNTER — Encounter: Payer: Self-pay | Admitting: Internal Medicine

## 2023-09-14 ENCOUNTER — Encounter: Payer: Self-pay | Admitting: Medical Oncology

## 2023-09-14 DIAGNOSIS — Z79899 Other long term (current) drug therapy: Secondary | ICD-10-CM | POA: Diagnosis not present

## 2023-09-14 DIAGNOSIS — Z923 Personal history of irradiation: Secondary | ICD-10-CM | POA: Diagnosis not present

## 2023-09-14 DIAGNOSIS — I480 Paroxysmal atrial fibrillation: Secondary | ICD-10-CM | POA: Insufficient documentation

## 2023-09-14 DIAGNOSIS — C3411 Malignant neoplasm of upper lobe, right bronchus or lung: Secondary | ICD-10-CM | POA: Insufficient documentation

## 2023-09-14 DIAGNOSIS — C7972 Secondary malignant neoplasm of left adrenal gland: Secondary | ICD-10-CM | POA: Insufficient documentation

## 2023-09-14 DIAGNOSIS — Z7982 Long term (current) use of aspirin: Secondary | ICD-10-CM | POA: Diagnosis not present

## 2023-09-14 DIAGNOSIS — I10 Essential (primary) hypertension: Secondary | ICD-10-CM | POA: Diagnosis not present

## 2023-09-14 DIAGNOSIS — E039 Hypothyroidism, unspecified: Secondary | ICD-10-CM | POA: Diagnosis not present

## 2023-09-14 DIAGNOSIS — E785 Hyperlipidemia, unspecified: Secondary | ICD-10-CM | POA: Insufficient documentation

## 2023-09-14 DIAGNOSIS — D693 Immune thrombocytopenic purpura: Secondary | ICD-10-CM | POA: Diagnosis not present

## 2023-09-14 DIAGNOSIS — C7951 Secondary malignant neoplasm of bone: Secondary | ICD-10-CM | POA: Insufficient documentation

## 2023-09-14 DIAGNOSIS — C7931 Secondary malignant neoplasm of brain: Secondary | ICD-10-CM | POA: Insufficient documentation

## 2023-09-14 DIAGNOSIS — I2510 Atherosclerotic heart disease of native coronary artery without angina pectoris: Secondary | ICD-10-CM | POA: Diagnosis not present

## 2023-09-14 DIAGNOSIS — E119 Type 2 diabetes mellitus without complications: Secondary | ICD-10-CM | POA: Diagnosis not present

## 2023-09-14 DIAGNOSIS — Z7984 Long term (current) use of oral hypoglycemic drugs: Secondary | ICD-10-CM | POA: Diagnosis not present

## 2023-09-14 DIAGNOSIS — Z7901 Long term (current) use of anticoagulants: Secondary | ICD-10-CM | POA: Insufficient documentation

## 2023-09-14 DIAGNOSIS — C3491 Malignant neoplasm of unspecified part of right bronchus or lung: Secondary | ICD-10-CM

## 2023-09-14 LAB — CBC WITH DIFFERENTIAL (CANCER CENTER ONLY)
Abs Immature Granulocytes: 0.45 K/uL — ABNORMAL HIGH (ref 0.00–0.07)
Basophils Absolute: 0.1 K/uL (ref 0.0–0.1)
Basophils Relative: 1 %
Eosinophils Absolute: 0 K/uL (ref 0.0–0.5)
Eosinophils Relative: 0 %
HCT: 29.3 % — ABNORMAL LOW (ref 36.0–46.0)
Hemoglobin: 10.2 g/dL — ABNORMAL LOW (ref 12.0–15.0)
Immature Granulocytes: 10 %
Lymphocytes Relative: 14 %
Lymphs Abs: 0.6 K/uL — ABNORMAL LOW (ref 0.7–4.0)
MCH: 31.3 pg (ref 26.0–34.0)
MCHC: 34.8 g/dL (ref 30.0–36.0)
MCV: 89.9 fL (ref 80.0–100.0)
Monocytes Absolute: 0.5 K/uL (ref 0.1–1.0)
Monocytes Relative: 11 %
Neutro Abs: 2.8 K/uL (ref 1.7–7.7)
Neutrophils Relative %: 64 %
Platelet Count: 134 K/uL — ABNORMAL LOW (ref 150–400)
RBC: 3.26 MIL/uL — ABNORMAL LOW (ref 3.87–5.11)
RDW: 14.4 % (ref 11.5–15.5)
Smear Review: NORMAL
WBC Count: 4.5 K/uL (ref 4.0–10.5)
nRBC: 1.8 % — ABNORMAL HIGH (ref 0.0–0.2)

## 2023-09-14 LAB — CMP (CANCER CENTER ONLY)
ALT: 39 U/L (ref 0–44)
AST: 22 U/L (ref 15–41)
Albumin: 3.3 g/dL — ABNORMAL LOW (ref 3.5–5.0)
Alkaline Phosphatase: 74 U/L (ref 38–126)
Anion gap: 5 (ref 5–15)
BUN: 13 mg/dL (ref 8–23)
CO2: 29 mmol/L (ref 22–32)
Calcium: 8.8 mg/dL — ABNORMAL LOW (ref 8.9–10.3)
Chloride: 104 mmol/L (ref 98–111)
Creatinine: 0.76 mg/dL (ref 0.44–1.00)
GFR, Estimated: >60 mL/min (ref >60)
Glucose, Bld: 173 mg/dL — ABNORMAL HIGH (ref 70–99)
Potassium: 3.4 mmol/L — ABNORMAL LOW (ref 3.5–5.1)
Sodium: 138 mmol/L (ref 135–145)
Total Bilirubin: 0.3 mg/dL (ref 0.0–1.2)
Total Protein: 6.4 g/dL — ABNORMAL LOW (ref 6.5–8.1)
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
## 2023-09-15 ENCOUNTER — Inpatient Hospital Stay

## 2023-09-17 ENCOUNTER — Telehealth: Payer: Self-pay | Admitting: Medical Oncology

## 2023-09-17 NOTE — Telephone Encounter (Signed)
 LVM to return my call re symptoms she reported on 09/16/2023 including lightheaded, pain in R eye, BP 162/101, nasal drainage and blurry vision.

## 2023-09-18 ENCOUNTER — Emergency Department (HOSPITAL_COMMUNITY)

## 2023-09-18 ENCOUNTER — Other Ambulatory Visit: Payer: Self-pay

## 2023-09-18 ENCOUNTER — Emergency Department (HOSPITAL_COMMUNITY)
Admission: EM | Admit: 2023-09-18 | Discharge: 2023-09-18 | Disposition: A | Discharge status: Home / Self Care | Source: Home / Self Care | Attending: Emergency Medicine | Admitting: Emergency Medicine

## 2023-09-18 ENCOUNTER — Encounter (HOSPITAL_COMMUNITY): Payer: Self-pay

## 2023-09-18 ENCOUNTER — Observation Stay (HOSPITAL_COMMUNITY)
Admission: EM | Admit: 2023-09-18 | Discharge: 2023-09-21 | Disposition: A | Discharge status: Home / Self Care | Source: Ambulatory Visit | Attending: Internal Medicine | Admitting: Internal Medicine

## 2023-09-18 DIAGNOSIS — Z79899 Other long term (current) drug therapy: Secondary | ICD-10-CM | POA: Insufficient documentation

## 2023-09-18 DIAGNOSIS — E119 Type 2 diabetes mellitus without complications: Secondary | ICD-10-CM | POA: Insufficient documentation

## 2023-09-18 DIAGNOSIS — I129 Hypertensive chronic kidney disease with stage 1 through stage 4 chronic kidney disease, or unspecified chronic kidney disease: Secondary | ICD-10-CM | POA: Diagnosis not present

## 2023-09-18 DIAGNOSIS — J45909 Unspecified asthma, uncomplicated: Secondary | ICD-10-CM | POA: Insufficient documentation

## 2023-09-18 DIAGNOSIS — I2510 Atherosclerotic heart disease of native coronary artery without angina pectoris: Secondary | ICD-10-CM | POA: Insufficient documentation

## 2023-09-18 DIAGNOSIS — K219 Gastro-esophageal reflux disease without esophagitis: Secondary | ICD-10-CM | POA: Diagnosis not present

## 2023-09-18 DIAGNOSIS — Z6834 Body mass index (BMI) 34.0-34.9, adult: Secondary | ICD-10-CM | POA: Diagnosis not present

## 2023-09-18 DIAGNOSIS — E1140 Type 2 diabetes mellitus with diabetic neuropathy, unspecified: Secondary | ICD-10-CM | POA: Diagnosis not present

## 2023-09-18 DIAGNOSIS — I4891 Unspecified atrial fibrillation: Secondary | ICD-10-CM | POA: Insufficient documentation

## 2023-09-18 DIAGNOSIS — Z7982 Long term (current) use of aspirin: Secondary | ICD-10-CM | POA: Insufficient documentation

## 2023-09-18 DIAGNOSIS — E785 Hyperlipidemia, unspecified: Secondary | ICD-10-CM | POA: Insufficient documentation

## 2023-09-18 DIAGNOSIS — K5730 Diverticulosis of large intestine without perforation or abscess without bleeding: Secondary | ICD-10-CM | POA: Insufficient documentation

## 2023-09-18 DIAGNOSIS — E66811 Obesity, class 1: Secondary | ICD-10-CM | POA: Insufficient documentation

## 2023-09-18 DIAGNOSIS — Z91040 Latex allergy status: Secondary | ICD-10-CM | POA: Insufficient documentation

## 2023-09-18 DIAGNOSIS — E039 Hypothyroidism, unspecified: Secondary | ICD-10-CM | POA: Insufficient documentation

## 2023-09-18 DIAGNOSIS — J40 Bronchitis, not specified as acute or chronic: Secondary | ICD-10-CM

## 2023-09-18 DIAGNOSIS — A419 Sepsis, unspecified organism: Principal | ICD-10-CM

## 2023-09-18 DIAGNOSIS — I10 Essential (primary) hypertension: Secondary | ICD-10-CM | POA: Insufficient documentation

## 2023-09-18 DIAGNOSIS — R509 Fever, unspecified: Secondary | ICD-10-CM | POA: Diagnosis present

## 2023-09-18 DIAGNOSIS — J069 Acute upper respiratory infection, unspecified: Secondary | ICD-10-CM | POA: Insufficient documentation

## 2023-09-18 DIAGNOSIS — E1122 Type 2 diabetes mellitus with diabetic chronic kidney disease: Secondary | ICD-10-CM | POA: Insufficient documentation

## 2023-09-18 DIAGNOSIS — Z7901 Long term (current) use of anticoagulants: Secondary | ICD-10-CM | POA: Insufficient documentation

## 2023-09-18 DIAGNOSIS — E871 Hypo-osmolality and hyponatremia: Secondary | ICD-10-CM | POA: Insufficient documentation

## 2023-09-18 DIAGNOSIS — N1831 Chronic kidney disease, stage 3a: Secondary | ICD-10-CM | POA: Diagnosis not present

## 2023-09-18 DIAGNOSIS — J209 Acute bronchitis, unspecified: Secondary | ICD-10-CM | POA: Diagnosis not present

## 2023-09-18 LAB — CBC WITH DIFFERENTIAL/PLATELET
Abs Immature Granulocytes: 0.12 K/uL — ABNORMAL HIGH (ref 0.00–0.07)
Abs Immature Granulocytes: 0.12 K/uL — ABNORMAL HIGH (ref 0.00–0.07)
Basophils Absolute: 0 K/uL (ref 0.0–0.1)
Basophils Absolute: 0 K/uL (ref 0.0–0.1)
Basophils Relative: 0 %
Basophils Relative: 0 %
Eosinophils Absolute: 0 K/uL (ref 0.0–0.5)
Eosinophils Absolute: 0 K/uL (ref 0.0–0.5)
Eosinophils Relative: 0 %
Eosinophils Relative: 0 %
HCT: 29.2 % — ABNORMAL LOW (ref 36.0–46.0)
HCT: 31.4 % — ABNORMAL LOW (ref 36.0–46.0)
Hemoglobin: 9.9 g/dL — ABNORMAL LOW (ref 12.0–15.0)
Hemoglobin: 10.7 g/dL — ABNORMAL LOW (ref 12.0–15.0)
Immature Granulocytes: 2 %
Immature Granulocytes: 2 %
Lymphocytes Relative: 21 %
Lymphocytes Relative: 21 %
Lymphs Abs: 1.1 K/uL (ref 0.7–4.0)
Lymphs Abs: 1.2 K/uL (ref 0.7–4.0)
MCH: 31.6 pg (ref 26.0–34.0)
MCH: 31.8 pg (ref 26.0–34.0)
MCHC: 33.9 g/dL (ref 30.0–36.0)
MCHC: 34.1 g/dL (ref 30.0–36.0)
MCV: 93.3 fL (ref 80.0–100.0)
MCV: 93.5 fL (ref 80.0–100.0)
Monocytes Absolute: 0.8 K/uL (ref 0.1–1.0)
Monocytes Absolute: 0.9 K/uL (ref 0.1–1.0)
Monocytes Relative: 15 %
Monocytes Relative: 15 %
Neutro Abs: 3.4 K/uL (ref 1.7–7.7)
Neutro Abs: 3.7 K/uL (ref 1.7–7.7)
Neutrophils Relative %: 62 %
Neutrophils Relative %: 62 %
Platelets: 235 K/uL (ref 150–400)
Platelets: 267 K/uL (ref 150–400)
RBC: 3.13 MIL/uL — ABNORMAL LOW (ref 3.87–5.11)
RBC: 3.36 MIL/uL — ABNORMAL LOW (ref 3.87–5.11)
RDW: 15 % (ref 11.5–15.5)
RDW: 15 % (ref 11.5–15.5)
WBC: 5.5 K/uL (ref 4.0–10.5)
WBC: 6 K/uL (ref 4.0–10.5)
nRBC: 0 % (ref 0.0–0.2)
nRBC: 0 % (ref 0.0–0.2)

## 2023-09-18 LAB — URINALYSIS, W/ REFLEX TO CULTURE (INFECTION SUSPECTED)
Bacteria, UA: NONE SEEN
Bilirubin Urine: NEGATIVE
Glucose, UA: NEGATIVE mg/dL
Ketones, ur: NEGATIVE mg/dL
Leukocytes,Ua: NEGATIVE
Nitrite: NEGATIVE
Protein, ur: NEGATIVE mg/dL
Specific Gravity, Urine: 1.005 (ref 1.005–1.030)
pH: 7 (ref 5.0–8.0)

## 2023-09-18 LAB — COMPREHENSIVE METABOLIC PANEL WITH GFR
ALT: 33 U/L (ref 0–44)
ALT: 34 U/L (ref 0–44)
AST: 26 U/L (ref 15–41)
AST: 26 U/L (ref 15–41)
Albumin: 3 g/dL — ABNORMAL LOW (ref 3.5–5.0)
Albumin: 3 g/dL — ABNORMAL LOW (ref 3.5–5.0)
Alkaline Phosphatase: 54 U/L (ref 38–126)
Alkaline Phosphatase: 51 U/L (ref 38–126)
Anion gap: 10 (ref 5–15)
Anion gap: 11 (ref 5–15)
BUN: 10 mg/dL (ref 8–23)
BUN: 11 mg/dL (ref 8–23)
CO2: 23 mmol/L (ref 22–32)
CO2: 23 mmol/L (ref 22–32)
Calcium: 8.6 mg/dL — ABNORMAL LOW (ref 8.9–10.3)
Calcium: 9 mg/dL (ref 8.9–10.3)
Chloride: 100 mmol/L (ref 98–111)
Chloride: 97 mmol/L — ABNORMAL LOW (ref 98–111)
Creatinine, Ser: 0.86 mg/dL (ref 0.44–1.00)
Creatinine, Ser: 1.03 mg/dL — ABNORMAL HIGH (ref 0.44–1.00)
GFR, Estimated: >60 mL/min (ref >60)
GFR, Estimated: >60 mL/min (ref >60)
Glucose, Bld: 121 mg/dL — ABNORMAL HIGH (ref 70–99)
Glucose, Bld: 120 mg/dL — ABNORMAL HIGH (ref 70–99)
Potassium: 3.8 mmol/L (ref 3.5–5.1)
Potassium: 3.9 mmol/L (ref 3.5–5.1)
Sodium: 133 mmol/L — ABNORMAL LOW (ref 135–145)
Sodium: 131 mmol/L — ABNORMAL LOW (ref 135–145)
Total Bilirubin: 0.5 mg/dL (ref 0.0–1.2)
Total Bilirubin: 0.8 mg/dL (ref 0.0–1.2)
Total Protein: 6.5 g/dL (ref 6.5–8.1)
Total Protein: 6.9 g/dL (ref 6.5–8.1)

## 2023-09-18 LAB — URINALYSIS, ROUTINE W REFLEX MICROSCOPIC
Bacteria, UA: NONE SEEN
Bilirubin Urine: NEGATIVE
Glucose, UA: NEGATIVE mg/dL
Ketones, ur: NEGATIVE mg/dL
Leukocytes,Ua: NEGATIVE
Nitrite: NEGATIVE
Protein, ur: NEGATIVE mg/dL
Specific Gravity, Urine: 1.004 — ABNORMAL LOW (ref 1.005–1.030)
pH: 7 (ref 5.0–8.0)

## 2023-09-18 LAB — LACTIC ACID, PLASMA
Lactic Acid, Venous: 1.2 mmol/L (ref 0.5–1.9)
Lactic Acid, Venous: 1.1 mmol/L (ref 0.5–1.9)
Lactic Acid, Venous: 1.1 mmol/L (ref 0.5–1.9)

## 2023-09-18 LAB — RESP PANEL BY RT-PCR (RSV, FLU A&B, COVID)  RVPGX2
Influenza A by PCR: NEGATIVE
Influenza B by PCR: NEGATIVE
Resp Syncytial Virus by PCR: NEGATIVE
SARS Coronavirus 2 by RT PCR: NEGATIVE

## 2023-09-18 LAB — PROTIME-INR
INR: 2.3 — ABNORMAL HIGH (ref 0.8–1.2)
Prothrombin Time: 26.3 s — ABNORMAL HIGH (ref 11.4–15.2)

## 2023-09-18 MED ORDER — LIDOCAINE HCL (PF) 1 % IJ SOLN
INTRAMUSCULAR | Status: AC
  Administered 2023-09-18: 5 mL
  Filled 2023-09-18: qty 5

## 2023-09-18 MED ORDER — ONDANSETRON HCL 4 MG/2ML IJ SOLN
4.0000 mg | Freq: Once | INTRAMUSCULAR | Status: AC
  Administered 2023-09-18: 4 mg via INTRAVENOUS
  Filled 2023-09-18: qty 2

## 2023-09-18 MED ORDER — SODIUM CHLORIDE 0.9 % IV BOLUS
2000.0000 mL | Freq: Once | INTRAVENOUS | Status: AC
Start: 2023-09-18 — End: 2023-09-19
  Administered 2023-09-18: 2000 mL via INTRAVENOUS

## 2023-09-18 MED ORDER — ACETAMINOPHEN 325 MG PO TABS
650.0000 mg | ORAL_TABLET | Freq: Once | ORAL | Status: AC
  Administered 2023-09-18: 650 mg via ORAL
  Filled 2023-09-18: qty 2

## 2023-09-18 MED ORDER — IOHEXOL 300 MG/ML  SOLN
100.0000 mL | Freq: Once | INTRAMUSCULAR | Status: AC | PRN
  Administered 2023-09-18: 100 mL via INTRAVENOUS

## 2023-09-18 MED ORDER — CEFTRIAXONE SODIUM 500 MG IJ SOLR
500.0000 mg | Freq: Once | INTRAMUSCULAR | Status: AC
  Administered 2023-09-18: 500 mg via INTRAMUSCULAR
  Filled 2023-09-18: qty 500

## 2023-09-18 MED ORDER — VANCOMYCIN HCL IN DEXTROSE 1-5 GM/200ML-% IV SOLN
1000.0000 mg | Freq: Once | INTRAVENOUS | Status: AC
  Administered 2023-09-18: 1000 mg via INTRAVENOUS
  Filled 2023-09-18: qty 200

## 2023-09-18 NOTE — ED Triage Notes (Signed)
 Pt comes in for fever of 101. Pt was dx with URI. Pt was told come back if fever comes back. Pt did not take anything b/c she was told not to.      Pt has had port for chem. Pt was inpt last Friday and the port was became inflamed. Pt has not used it since.

## 2023-09-18 NOTE — ED Triage Notes (Signed)
 Pov from home with family. Cc of fever since yesterday. Said it was 100.7 at home. Took tylenol  at 1:40pm. Called the on call cancer nurse at [facility] and they said to come to the ED and not not take anything for the fever. C/o cough+ congestion. Denies pain.

## 2023-09-18 NOTE — Progress Notes (Signed)
 Elink monitoring for the code sepsis protocol.

## 2023-09-18 NOTE — ED Provider Notes (Signed)
 [place] EMERGENCY DEPARTMENT AT [facility] Provider Note   CSN: [id number] Arrival date & time: 09/18/23  0309     History Chief Complaint  Patient presents with   Fever    HPI Becky [name] is a 63 y.o. female presenting for chief complaint of fever.  Recently admitted for diverticulitis complicated by perforation. 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   Review of Systems   Review of Systems  Constitutional:  Positive for fever. Negative for chills.  HENT:  Positive for congestion. Negative for ear pain and sore throat.   Eyes:  Negative for pain and visual disturbance.  Respiratory:  Positive for cough. Negative for shortness of breath.   Cardiovascular:  Negative for chest pain and palpitations.  Gastrointestinal:  Negative for abdominal pain and vomiting.  Genitourinary:  Negative for dysuria and hematuria.  Musculoskeletal:  Negative for arthralgias and back pain.  Skin:  Negative for color change and rash.  Neurological:  Negative for seizures and syncope.  All other systems reviewed and are negative.   Physical Exam Updated Vital Signs BP 137/89   Pulse (!) 103   Temp 99.3 F (37.4 C) (Oral)   Resp (!) 22   Ht 5' 7 (1.702 m)   Wt 100.9 kg   SpO2 94%   BMI 34.84 kg/m  Physical Exam Vitals and nursing note reviewed.  Constitutional:      General: She is not in acute distress.    Appearance: She is well-developed.  HENT:     Head:  Normocephalic and atraumatic.  Eyes:     Conjunctiva/sclera: Conjunctivae normal.  Cardiovascular:     Rate and Rhythm: Normal rate and regular rhythm.     Heart sounds: No murmur heard. Pulmonary:     Effort: Pulmonary effort is normal. No respiratory distress.     Breath sounds: Normal breath sounds.  Abdominal:     General: There is no distension.     Palpations: Abdomen is soft.     Tenderness: There is no abdominal tenderness. There is no right CVA tenderness or left CVA tenderness.  Musculoskeletal:        General: No swelling or tenderness. Normal range of motion.     Cervical back: Neck supple.  Skin:    General: Skin is warm and dry.  Neurological:     General: No focal deficit present.     Mental Status: She is alert and oriented to person, place, and time. Mental status is at baseline.     Cranial Nerves: No cranial nerve deficit.      ED Course/ Medical Decision Making/ A&P    Procedures Procedures   Medications Ordered in ED Medications  cefTRIAXone  (ROCEPHIN ) injection 500 mg (500 mg Intramuscular Given 09/18/23 0516)  lidocaine  (PF) (XYLOCAINE ) 1 % injection (5 mLs  Given 09/18/23 0520)    Medical Decision Making:   This is a 63 year old oncology patient presenting with a chief complaint of fever.  Reported a temperature of 100.7 last night.  She does have upper respiratory symptoms including cough/congestion.  She denies any shortness of breath or  sputum production. Vital signs here are benign, temperature below 100, heart rate rate at 100.  Patient feels quite well.  Her abdominal exam is benign.  She endorses urinary frequency but denies dysuria. Lab work was performed, her neutrophils have recovered since her last chemotherapy infusion.  Patient was treated per standard protocol for oncology fever with immediate IM Rocephin , collection of urine culture, lactic acid, blood cultures.  Rocephin  should provide antimicrobial coverage until blood cultures result.   I also messaged patient's primary oncologist using secure messaging in the EMR (not chat) listed at the bottom of this note. Overall her history of present illness and physical exam findings are more consistent with a viral upper respiratory infection rather than serious bacterial illness.  Her abdominal exam is completely benign at this time.  She has been ambulatory tolerating p.o. intake and has no tenderness.  Blood and urine cultures may help differentiate but patient is currently immunocompetent with stable vital signs not indicative of sepsis or bacteremia.  Patient has been covered for antimicrobials at this time and has been recommended follow-up with her PCP/oncologist within 48 hours with an already scheduled visit at that time.  She was also instructed to return to the emergency department if she has any further fevers or development of localizing signs or symptoms.  Clinical Impression:  1. Upper respiratory tract infection, unspecified type   2. Febrile illness      Data Unavailable   Final Clinical Impression(s) / ED Diagnoses Final diagnoses:  Upper respiratory tract infection, unspecified type  Febrile illness    Rx / DC Orders ED Discharge Orders     None     Message sent to patient's primary oncologist:  Hey Dr. Gatha, Sorry to bother you on a Friday morning.  Just wanted to message you to let you know that Becky [name] came to the emergency department last night with a reported upper respiratory syndrome and fever.  She just finished her Augmentin  course for diverticulitis.  She got the protocol for oncology fever including a blood culture draw for peripheral line, lactic acid check and lab work check.  She currently feels quite well and on lab work appears immunocompetent having had recovery of her white count/neutrophils since her last treatment.  She wanted me to let you know that she is scheduled for her next treatment on Monday but did have a temperature of 100.7  on her home thermometer.  She only peaked at 99.3 here.  She had blood cultures drawn and was loaded with IM Rocephin  as part of the protocol but when lab work resulted benignly I do not think she needs any further antibiotics at this point. Figured I would message you early so that you could add on more antibiotics over the next 24 hours if you feel the need to be continued beyond the initial Rocephin  load or want any changes in her care plan. Hope you have a good day,  Cyndee Dada MD     Dada Cyndee, MD 09/18/23 [phone]

## 2023-09-18 NOTE — ED Provider Notes (Signed)
 [facility]PARTMENT AT [facility] Provider Note   CSN: [id number] Arrival date & time: 09/18/23  2015     Patient presents with: No chief complaint on file.   Becky [name] is a 63 y.o. female.  {Add pertinent medical, surgical, social history, OB history to YEP:67052} Patient has a history of metastatic lung disease, atrial fibs, and recent admission for diverticulitis.  She was seen earlier this morning with fever and was cultured and given a shot of Rocephin .  Patient returns today for worsening fever and chills.  The history is provided by the patient and medical records. No language interpreter was used.  Fever Temp source:  Oral Severity:  Moderate Onset quality:  Sudden Timing:  Constant Progression:  Worsening Chronicity:  Recurrent Relieved by:  Nothing Worsened by:  Nothing Ineffective treatments:  None tried Associated symptoms: chills   Associated symptoms: no chest pain, no congestion, no cough, no diarrhea, no headaches and no rash        Prior to Admission medications   Medication Sig Start Date End Date Taking? Authorizing Provider  acetaminophen  (TYLENOL ) 500 MG tablet Take 1,000 mg by mouth every 6 (six) hours as needed for mild pain (pain score 1-3).    [provider]  albuterol  (VENTOLIN  HFA) 108 (90 Base) MCG/ACT inhaler Inhale 2 puffs into the lungs every 4 (four) hours as needed for wheezing or shortness of breath. 12/07/21   Stuart Vernell Norris, PA-C  amoxicillin -clavulanate (AUGMENTIN ) 875-125 MG tablet Take 1 tablet by mouth every 12 (twelve) hours. 09/10/23   Evonnie Lenis, MD  aspirin  EC 81 MG tablet Take 1 tablet (81 mg total) by mouth daily. Swallow whole. 07/25/23   Ricky Fines, MD  azelastine  (ASTELIN ) 0.1 % nasal spray Place 2 sprays into both nostrils 2 (two) times daily. Use in each nostril as directed Patient taking differently: Place 2 sprays into both nostrils 2 (two) times daily as needed for rhinitis  or allergies. Use in each nostril as directed 07/12/23   Leath-Warren, Etta PARAS, NP  dexamethasone  (DECADRON ) 4 MG tablet Take 1 tablet (4 mg total) by mouth 2 (two) times daily with a meal. Follow weaning instructions as directed by Dr. Shannon 09/10/23   Evonnie Lenis, MD  dexlansoprazole  (DEXILANT ) 60 MG capsule Take 1 capsule (60 mg total) by mouth daily. 08/12/23   Shirlean Therisa ORN, NP  diltiazem  (TIADYLT  ER) 360 MG 24 hr capsule TAKE ONE (1) CAPSULE BY MOUTH ONCE DAILY *NEW PRESCRIPTION REQUEST* 08/05/23   Alvan Dorn FALCON, MD  EPINEPHrine  0.3 mg/0.3 mL IJ SOAJ injection Inject 0.3 mg into the muscle once as needed for anaphylaxis. 04/20/18   [provider]  folic acid  (FOLVITE ) 1 MG tablet Take 1 tablet (1 mg total) by mouth daily. 06/18/23   Kandala, Hyndavi, MD  glimepiride (AMARYL) 2 MG tablet Take 2 mg by mouth daily with breakfast.    [provider]  KLOR-CON  M20 20 MEQ tablet Take 20 mEq by mouth daily. [name] takes as needed when she has cramps Patient taking differently: Take 20 mEq by mouth daily as needed. Pt takes as needed when she has cramps 08/28/22   [provider]  lidocaine -prilocaine  (EMLA ) cream APPLY TOPICALLY TO AFFECTED AREAS ONCE DAILY 09/14/23   Sherrod Sherrod, MD  linaclotide  (LINZESS ) 72 MCG capsule Take 1 capsule (72 mcg total) by mouth daily before breakfast. 05/05/23   Shirlean Therisa ORN, NP  LORazepam  (ATIVAN ) 0.5 MG tablet Take 1 tablet (0.5 mg  total) by mouth every 8 (eight) hours as needed for anxiety. Take 1 hour prior to radiation treatment 07/15/23   Shannon Agent, MD  metoprolol  tartrate (LOPRESSOR ) 25 MG tablet Take 12.5 mg by mouth 2 (two) times daily. 08/06/23   [provider]  montelukast  (SINGULAIR ) 10 MG tablet Take 10 mg by mouth at bedtime. 08/25/16   [provider]  NON FORMULARY Pt uses a cpap nightly    [provider]  olmesartan  (BENICAR ) 40 MG tablet Take 1 tablet (40 mg total) by mouth daily. Patient taking  differently: Take 20 mg by mouth daily. 08/05/23   Ladona Heinz, MD  ondansetron  (ZOFRAN ) 8 MG tablet TAKE 1 TABLET BY MOUTH EVERY 8 HOURS FOR NAUSEA & VOMITING, START ON THE THIRD DAY AFTER CHEMOTHERAPY Patient taking differently: Take 8 mg by mouth every 8 (eight) hours as needed for nausea or vomiting. 08/08/23   Sherrod Sherrod, MD  polyethylene glycol (MIRALAX ) 17 g packet Take 17 g by mouth daily. Patient taking differently: Take 17 g by mouth daily as needed for mild constipation. As needed 04/10/23   Maree, Pratik D, DO  prochlorperazine  (COMPAZINE ) 10 MG tablet Take 1 tablet (10 mg total) by mouth every 6 (six) hours as needed. Patient taking differently: Take 10 mg by mouth every 6 (six) hours as needed for nausea or vomiting. 08/04/23   Heilingoetter, Cassandra L, PA-C  promethazine -dextromethorphan  (PROMETHAZINE -DM) 6.25-15 MG/5ML syrup Take 5 mLs by mouth 4 (four) times daily as needed. Patient taking differently: Take 5 mLs by mouth 4 (four) times daily as needed for cough. 07/12/23   Leath-Warren, Etta PARAS, NP  RESTASIS  0.05 % ophthalmic emulsion Place 1 drop into both eyes daily as needed (dry eyes). 04/24/23   [provider]  rivaroxaban  (XARELTO ) 20 MG TABS tablet Take 1 tablet (20 mg total) by mouth daily. 08/24/23   Alvan Dorn FALCON, MD  rosuvastatin  (CRESTOR ) 20 MG tablet Take 1 tablet (20 mg total) by mouth daily. 08/05/23   Ladona Heinz, MD  SENEXON-S 8.6-50 MG tablet Take 1 tablet by mouth at bedtime as needed for mild constipation. 08/14/23   [provider]  SYNTHROID  25 MCG tablet Take 25 mcg by mouth daily before breakfast. 09/25/22   [provider]    Allergies: Apresoline  [hydralazine ], Latex, Other, and Prednisone     Review of Systems  Constitutional:  Positive for chills and fever. Negative for appetite change and fatigue.  HENT:  Negative for congestion, ear discharge and sinus pressure.   Eyes:  Negative for discharge.  Respiratory:  Negative for  cough.   Cardiovascular:  Negative for chest pain.  Gastrointestinal:  Negative for abdominal pain and diarrhea.  Genitourinary:  Negative for frequency and hematuria.  Musculoskeletal:  Negative for back pain.  Skin:  Negative for rash.  Neurological:  Negative for seizures and headaches.  Psychiatric/Behavioral:  Negative for hallucinations.     Updated Vital Signs BP 116/69   Pulse 80   Temp (!) 101.9 F (38.8 C) (Oral)   Resp (!) 7   Ht 5' 7 (1.702 m)   Wt 100.9 kg   SpO2 100%   BMI 34.84 kg/m   Physical Exam Vitals and nursing note reviewed.  Constitutional:      Appearance: She is well-developed.  HENT:     Head: Normocephalic.     Nose: Nose normal.  Eyes:     General: No scleral icterus.    Conjunctiva/sclera: Conjunctivae normal.  Neck:  Thyroid : No thyromegaly.  Cardiovascular:     Rate and Rhythm: Normal rate and regular rhythm.     Heart sounds: No murmur heard.    No friction rub. No gallop.  Pulmonary:     Breath sounds: No stridor. No wheezing or rales.  Chest:     Chest wall: No tenderness.  Abdominal:     General: There is no distension.     Tenderness: There is no abdominal tenderness. There is no rebound.  Musculoskeletal:        General: Normal range of motion.     Cervical back: Neck supple.  Lymphadenopathy:     Cervical: No cervical adenopathy.  Skin:    Findings: No erythema or rash.  Neurological:     Mental Status: She is oriented to person, place, and time.     Motor: No abnormal muscle tone.     Coordination: Coordination normal.  Psychiatric:        Behavior: Behavior normal.     (all labs ordered are listed, but only abnormal results are displayed) Labs Reviewed  CBC WITH DIFFERENTIAL/PLATELET - Abnormal; Notable for the following components:      Result Value   RBC 3.36 (*)    Hemoglobin 10.7 (*)    HCT 31.4 (*)    Abs Immature Granulocytes 0.12 (*)    All other components within normal limits  PROTIME-INR -  Abnormal; Notable for the following components:   Prothrombin Time 26.3 (*)    INR 2.3 (*)    All other components within normal limits  CULTURE, BLOOD (ROUTINE X 2)  CULTURE, BLOOD (ROUTINE X 2)  LACTIC ACID, PLASMA  LACTIC ACID, PLASMA  URINALYSIS, ROUTINE W REFLEX MICROSCOPIC  URINALYSIS, W/ REFLEX TO CULTURE (INFECTION SUSPECTED)  COMPREHENSIVE METABOLIC PANEL WITH GFR    EKG: None  Radiology: DG Chest 2 View Result Date: 09/18/2023 CLINICAL DATA:  sob EXAM: CHEST - 2 VIEW COMPARISON:  Chest x-ray 09/18/2023 FINDINGS: Left chest wall Port-A-Cath in similar position with tip overlying the right atrium. The heart and mediastinal contours are within normal limits. Persistent right upper lobe architectural distortion with underlying lesion not well visualized on this study. No pulmonary edema. No pleural effusion. No pneumothorax. No acute osseous abnormality. IMPRESSION: 1. No active cardiopulmonary disease. 2. Persistent right upper lobe architectural distortion with underlying lesion not well visualized on this study. Electronically Signed   By: Morgane  Naveau M.D.   On: 09/18/2023 21:14   DG Chest Portable 1 View Result Date: 09/18/2023 EXAM: 1 VIEW XRAY OF THE CHEST 09/18/2023 04:18:00 AM COMPARISON: 07/12/2023 CLINICAL HISTORY: Fever cough. Fever, cough FINDINGS: LUNGS AND PLEURA: Right upper lobe lung nodule measures 2.1 x 1.5 cm with adjacent scarring and architectural distortion. This corresponds to the patient's known tracer avid lung mass. No pleural effusion, pneumothorax or acute airspace consolidation. No signs of pulmonary edema. HEART AND MEDIASTINUM: No acute abnormality of the cardiac and mediastinal silhouettes. BONES AND SOFT TISSUES: Left chest wall port-a-catheter is noted with tip in the distal SVC. No acute osseous abnormality. IMPRESSION: 1. No acute findings. 2. Right upper lobe lung nodule measuring 2.1 x 1.5 cm with adjacent scarring and architectural distortion,  corresponding to the patient's known FDG avid lung mass. Electronically signed by: Waddell Calk MD 09/18/2023 05:35 AM EDT RP Workstation: [id number]    {Document cardiac monitor, telemetry assessment procedure when appropriate:32947} Procedures   Medications Ordered in the ED  acetaminophen  (TYLENOL ) tablet 650 mg (650 mg Oral Given  09/18/23 2141)  sodium chloride  0.9 % bolus 2,000 mL (2,000 mLs Intravenous New Bag/Given 09/18/23 2145)  vancomycin  (VANCOCIN ) IVPB 1000 mg/200 mL premix (0 mg Intravenous Stopped 09/18/23 2242)  ondansetron  (ZOFRAN ) injection 4 mg (4 mg Intravenous Given 09/18/23 2142)   I spoke with oncology and they are recommending CT chest abdomen and pelvis with medical admission and broad-spectrum antibiotics   {Click here for ABCD2, HEART and other calculators REFRESH Note before signing:1}                              Medical Decision Making Amount and/or Complexity of Data Reviewed Labs: ordered. Radiology: ordered.  Risk OTC drugs. Prescription drug management.   Patient with febrile illness.  She will be admitted to medicine and started on antibiotics  {Document critical care time when appropriate  Document review of labs and clinical decision tools ie CHADS2VASC2, etc  Document your independent review of radiology images and any outside records  Document your discussion with family members, caretakers and with consultants  Document social determinants of health affecting pt's care  Document your decision making why or why not admission, treatments were needed:32947:::1}   Final diagnoses:  None    ED Discharge Orders     None

## 2023-09-19 DIAGNOSIS — J189 Pneumonia, unspecified organism: Secondary | ICD-10-CM

## 2023-09-19 DIAGNOSIS — R509 Fever, unspecified: Secondary | ICD-10-CM | POA: Diagnosis not present

## 2023-09-19 LAB — CBC
HCT: 28.9 % — ABNORMAL LOW (ref 36.0–46.0)
Hemoglobin: 9.6 g/dL — ABNORMAL LOW (ref 12.0–15.0)
MCH: 31.4 pg (ref 26.0–34.0)
MCHC: 33.2 g/dL (ref 30.0–36.0)
MCV: 94.4 fL (ref 80.0–100.0)
Platelets: 256 K/uL (ref 150–400)
RBC: 3.06 MIL/uL — ABNORMAL LOW (ref 3.87–5.11)
RDW: 15.1 % (ref 11.5–15.5)
WBC: 4.6 K/uL (ref 4.0–10.5)
nRBC: 0 % (ref 0.0–0.2)

## 2023-09-19 LAB — BASIC METABOLIC PANEL WITH GFR
Anion gap: 10 (ref 5–15)
BUN: 10 mg/dL (ref 8–23)
CO2: 24 mmol/L (ref 22–32)
Calcium: 8.8 mg/dL — ABNORMAL LOW (ref 8.9–10.3)
Chloride: 102 mmol/L (ref 98–111)
Creatinine, Ser: 1.05 mg/dL — ABNORMAL HIGH (ref 0.44–1.00)
GFR, Estimated: >60 mL/min (ref >60)
Glucose, Bld: 162 mg/dL — ABNORMAL HIGH (ref 70–99)
Potassium: 3.9 mmol/L (ref 3.5–5.1)
Sodium: 136 mmol/L (ref 135–145)

## 2023-09-19 LAB — GLUCOSE, CAPILLARY
Glucose-Capillary: 122 mg/dL — ABNORMAL HIGH (ref 70–99)
Glucose-Capillary: 118 mg/dL — ABNORMAL HIGH (ref 70–99)
Glucose-Capillary: 142 mg/dL — ABNORMAL HIGH (ref 70–99)

## 2023-09-19 LAB — URINE CULTURE: Culture: <10000 — AB

## 2023-09-19 LAB — MAGNESIUM: Magnesium: 1.7 mg/dL (ref 1.7–2.4)

## 2023-09-19 LAB — CBG MONITORING, ED
Glucose-Capillary: 146 mg/dL — ABNORMAL HIGH (ref 70–99)
Glucose-Capillary: 134 mg/dL — ABNORMAL HIGH (ref 70–99)

## 2023-09-19 LAB — PHOSPHORUS: Phosphorus: 2.8 mg/dL (ref 2.5–4.6)

## 2023-09-19 LAB — RESP PANEL BY RT-PCR (RSV, FLU A&B, COVID)  RVPGX2
Influenza A by PCR: NEGATIVE
Influenza B by PCR: NEGATIVE
Resp Syncytial Virus by PCR: NEGATIVE
SARS Coronavirus 2 by RT PCR: NEGATIVE

## 2023-09-19 LAB — TSH: TSH: 1.828 u[IU]/mL (ref 0.350–4.500)

## 2023-09-19 LAB — PROCALCITONIN: Procalcitonin: 0.2 ng/mL

## 2023-09-19 MED ORDER — METOPROLOL TARTRATE 25 MG PO TABS
12.5000 mg | ORAL_TABLET | Freq: Two times a day (BID) | ORAL | Status: DC
  Administered 2023-09-19 – 2023-09-21 (×5): 12.5 mg via ORAL
  Filled 2023-09-19 (×5): qty 1

## 2023-09-19 MED ORDER — INSULIN ASPART 100 UNIT/ML IJ SOLN
0.0000 [IU] | Freq: Three times a day (TID) | INTRAMUSCULAR | Status: DC
  Administered 2023-09-20: 1 [IU] via SUBCUTANEOUS

## 2023-09-19 MED ORDER — ALBUTEROL SULFATE (2.5 MG/3ML) 0.083% IN NEBU: 2.5000 mg | INHALATION_SOLUTION | RESPIRATORY_TRACT | Status: DC | PRN

## 2023-09-19 MED ORDER — SODIUM CHLORIDE 0.9 % IV SOLN
1.0000 g | INTRAVENOUS | Status: DC
  Administered 2023-09-19 – 2023-09-20 (×2): 1 g via INTRAVENOUS
  Filled 2023-09-19 (×2): qty 10

## 2023-09-19 MED ORDER — MELATONIN 3 MG PO TABS
6.0000 mg | ORAL_TABLET | Freq: Every evening | ORAL | Status: DC | PRN
  Administered 2023-09-20: 6 mg via ORAL
  Filled 2023-09-19: qty 2

## 2023-09-19 MED ORDER — METHOCARBAMOL 500 MG PO TABS
500.0000 mg | ORAL_TABLET | Freq: Three times a day (TID) | ORAL | Status: DC | PRN
  Administered 2023-09-19: 500 mg via ORAL
  Filled 2023-09-19 (×2): qty 1

## 2023-09-19 MED ORDER — ROSUVASTATIN CALCIUM 20 MG PO TABS
20.0000 mg | ORAL_TABLET | Freq: Every day | ORAL | Status: DC
  Administered 2023-09-19 – 2023-09-21 (×3): 20 mg via ORAL
  Filled 2023-09-19 (×3): qty 1

## 2023-09-19 MED ORDER — RIVAROXABAN 20 MG PO TABS
20.0000 mg | ORAL_TABLET | Freq: Every day | ORAL | Status: DC
  Administered 2023-09-19 – 2023-09-20 (×2): 20 mg via ORAL
  Filled 2023-09-19 (×2): qty 1

## 2023-09-19 MED ORDER — FOLIC ACID 1 MG PO TABS
1.0000 mg | ORAL_TABLET | Freq: Every day | ORAL | Status: DC
Start: 2023-09-19 — End: 2023-09-21
  Administered 2023-09-19 – 2023-09-21 (×3): 1 mg via ORAL
  Filled 2023-09-19 (×3): qty 1

## 2023-09-19 MED ORDER — AZITHROMYCIN 250 MG PO TABS
500.0000 mg | ORAL_TABLET | Freq: Every day | ORAL | Status: DC
Start: 2023-09-19 — End: 2023-09-22
  Administered 2023-09-19 – 2023-09-20 (×2): 500 mg via ORAL
  Filled 2023-09-19 (×2): qty 2

## 2023-09-19 MED ORDER — SODIUM CHLORIDE 0.9% FLUSH
3.0000 mL | Freq: Two times a day (BID) | INTRAVENOUS | Status: DC
Start: 2023-09-19 — End: 2023-09-21
  Administered 2023-09-19 – 2023-09-21 (×5): 3 mL via INTRAVENOUS

## 2023-09-19 MED ORDER — LINACLOTIDE 72 MCG PO CAPS
72.0000 ug | ORAL_CAPSULE | Freq: Every day | ORAL | Status: DC
  Administered 2023-09-20 – 2023-09-21 (×2): 72 ug via ORAL
  Filled 2023-09-19 (×3): qty 1

## 2023-09-19 MED ORDER — LORAZEPAM 0.5 MG PO TABS
0.5000 mg | ORAL_TABLET | Freq: Three times a day (TID) | ORAL | Status: DC | PRN
  Administered 2023-09-20: 0.5 mg via ORAL
  Filled 2023-09-19: qty 1

## 2023-09-19 MED ORDER — MONTELUKAST SODIUM 10 MG PO TABS
10.0000 mg | ORAL_TABLET | Freq: Every day | ORAL | Status: DC
  Administered 2023-09-19 – 2023-09-20 (×2): 10 mg via ORAL
  Filled 2023-09-19 (×2): qty 1

## 2023-09-19 MED ORDER — LEVOTHYROXINE SODIUM 25 MCG PO TABS
25.0000 ug | ORAL_TABLET | Freq: Every day | ORAL | Status: DC
  Administered 2023-09-20 – 2023-09-21 (×2): 25 ug via ORAL
  Filled 2023-09-19 (×2): qty 1

## 2023-09-19 MED ORDER — ACETAMINOPHEN 500 MG PO TABS
1000.0000 mg | ORAL_TABLET | Freq: Four times a day (QID) | ORAL | Status: DC | PRN
  Administered 2023-09-19 – 2023-09-20 (×3): 1000 mg via ORAL
  Filled 2023-09-19 (×3): qty 2

## 2023-09-19 MED ORDER — ONDANSETRON HCL 4 MG/2ML IJ SOLN
4.0000 mg | Freq: Four times a day (QID) | INTRAMUSCULAR | Status: DC | PRN
  Administered 2023-09-19 – 2023-09-20 (×2): 4 mg via INTRAVENOUS
  Filled 2023-09-19 (×2): qty 2

## 2023-09-19 MED ORDER — PANTOPRAZOLE SODIUM 40 MG PO TBEC
40.0000 mg | DELAYED_RELEASE_TABLET | Freq: Every day | ORAL | Status: DC
Start: 2023-09-19 — End: 2023-09-21
  Administered 2023-09-19 – 2023-09-21 (×3): 40 mg via ORAL
  Filled 2023-09-19 (×3): qty 1

## 2023-09-19 MED ORDER — DILTIAZEM HCL ER COATED BEADS 240 MG PO CP24
360.0000 mg | ORAL_CAPSULE | Freq: Every day | ORAL | Status: DC
  Administered 2023-09-19 – 2023-09-21 (×3): 360 mg via ORAL
  Filled 2023-09-19 (×3): qty 1

## 2023-09-19 MED ORDER — POLYETHYLENE GLYCOL 3350 17 G PO PACK
17.0000 g | PACK | Freq: Every day | ORAL | Status: DC | PRN
  Filled 2023-09-19: qty 1
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
## 2023-09-19 NOTE — Plan of Care (Signed)
   Problem: Education: Goal: Ability to describe self-care measures that may prevent or decrease complications (Diabetes Survival Skills Education) will improve Outcome: Progressing Goal: Individualized Educational Video(s) Outcome: Progressing

## 2023-09-19 NOTE — Care Management Obs Status (Signed)
 MEDICARE OBSERVATION STATUS NOTIFICATION   Patient Details  Name: Becky [name] MRN: [id number] Date of Birth: 05/15/1960   Medicare Observation Status Notification Given:  Yes    Nena LITTIE Coffee, RN 09/19/2023, 7:08 PM

## 2023-09-19 NOTE — Progress Notes (Signed)
 Patient seen and examined; admitted after midnight secondary to fever.  Underlying history of lung cancer status post chemoradiation and ongoing immunotherapy management.  Reports no chest pain, no nausea, no vomiting, no abdominal pain.  Complaining of lower extremity cramps.  Patient is hemodynamically stable.  Please refer to H&P written by Dr. Segars on 09/19/2023 for further info/details on admission.  Eric Nunnery MD [phone]

## 2023-09-19 NOTE — ED Notes (Signed)
Pt called out complaining of nausea

## 2023-09-19 NOTE — Plan of Care (Signed)
  Problem: Coping: Goal: Ability to adjust to condition or change in health will improve Outcome: Progressing   Problem: Education: Goal: Ability to describe self-care measures that may prevent or decrease complications (Diabetes Survival Skills Education) will improve Outcome: Progressing Goal: Individualized Educational Video(s) 09/19/2023 1552 by Lynnette Cena CROME, RN Outcome: Progressing 09/19/2023 1547 by Lynnette Cena CROME, RN Outcome: Progressing

## 2023-09-20 ENCOUNTER — Other Ambulatory Visit: Payer: Self-pay

## 2023-09-20 DIAGNOSIS — E871 Hypo-osmolality and hyponatremia: Secondary | ICD-10-CM | POA: Diagnosis not present

## 2023-09-20 DIAGNOSIS — J209 Acute bronchitis, unspecified: Secondary | ICD-10-CM | POA: Diagnosis not present

## 2023-09-20 DIAGNOSIS — I4891 Unspecified atrial fibrillation: Secondary | ICD-10-CM | POA: Diagnosis not present

## 2023-09-20 DIAGNOSIS — R509 Fever, unspecified: Secondary | ICD-10-CM | POA: Diagnosis not present

## 2023-09-20 DIAGNOSIS — E785 Hyperlipidemia, unspecified: Secondary | ICD-10-CM | POA: Diagnosis not present

## 2023-09-20 LAB — CBC
HCT: 29.1 % — ABNORMAL LOW (ref 36.0–46.0)
Hemoglobin: 9.8 g/dL — ABNORMAL LOW (ref 12.0–15.0)
MCH: 31.1 pg (ref 26.0–34.0)
MCHC: 33.7 g/dL (ref 30.0–36.0)
MCV: 92.4 fL (ref 80.0–100.0)
Platelets: 274 K/uL (ref 150–400)
RBC: 3.15 MIL/uL — ABNORMAL LOW (ref 3.87–5.11)
RDW: 14.8 % (ref 11.5–15.5)
WBC: 5.5 K/uL (ref 4.0–10.5)
nRBC: 0 % (ref 0.0–0.2)

## 2023-09-20 LAB — BASIC METABOLIC PANEL WITH GFR
Anion gap: 10 (ref 5–15)
BUN: 10 mg/dL (ref 8–23)
CO2: 22 mmol/L (ref 22–32)
Calcium: 8.8 mg/dL — ABNORMAL LOW (ref 8.9–10.3)
Chloride: 98 mmol/L (ref 98–111)
Creatinine, Ser: 1.14 mg/dL — ABNORMAL HIGH (ref 0.44–1.00)
GFR, Estimated: 54 mL/min — ABNORMAL LOW (ref >60)
Glucose, Bld: 121 mg/dL — ABNORMAL HIGH (ref 70–99)
Potassium: 3.9 mmol/L (ref 3.5–5.1)
Sodium: 130 mmol/L — ABNORMAL LOW (ref 135–145)

## 2023-09-20 LAB — GLUCOSE, CAPILLARY
Glucose-Capillary: 116 mg/dL — ABNORMAL HIGH (ref 70–99)
Glucose-Capillary: 166 mg/dL — ABNORMAL HIGH (ref 70–99)
Glucose-Capillary: 117 mg/dL — ABNORMAL HIGH (ref 70–99)
Glucose-Capillary: 149 mg/dL — ABNORMAL HIGH (ref 70–99)

## 2023-09-20 LAB — MAGNESIUM: Magnesium: 1.8 mg/dL (ref 1.7–2.4)

## 2023-09-20 NOTE — Progress Notes (Signed)
 Progress Note   [name] FMW:[id number] DOB: 1960-02-23 DOA: 09/18/2023     0 DOS: the patient was seen and examined on 09/20/2023   Brief hospital admission narrative course: As per H&P written by Dr. Keturah on 09/19/2023 [name] is a 63 y.o. 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 Returned with worsening fever.    Reports symptoms began 9/11 evening with fever, Seen in ED per Onc rec. + treatment per above., Was monitoring fever at home and noted recurrent fever at home so sought care. Associated chills. Otherwise reports that she has cough which comes and goes; this has recurred recently and associated with yellow sputum. Denies other URI symptoms. No sick contacts. No abd pain, N/V/D, blood in stool, rashes. Does note some bumps in her mouth, which are white. She denies any odynophagia     Assessment and plan 1-febrile illness -So far negative workup identifying source of infection - Patient with low-grade temperature overnight but overall feeling significantly better - At this moment we will stop antibiotics and follow clinical response - Continue as needed antipyretics - Patient's febrile illness at the moment most likely associated with underlying malignancy and ongoing chemotherapy. - Continue supportive care and maintain adequate hydration.  2-hyponatremia  - Stabilized after fluid resuscitation -  Continue to follow trend.  3-atrial fibrillation - Continue metoprolol  and diltiazem  for rate control - Continue Xarelto  for secondary prevention.  4-hypertension stable overall - Follow-up vital sign - Continue current antihypertensive agents.  5-hyperlipidemia - Continue statin.  6-history of asthma - Currently no wheezing or exacerbation appreciated - Continue the use of Singulair  and as needed bronchodilator management.  7-GERD - Continue PPI.  8-hypothyroidism - Continue Synthroid   9-type 2 diabetes mellitus - Holding oral hypoglycemic agents while inpatient - Continue sliding scale insulin . - Follow CBG fluctuation.  10-class I obesity Body mass index is 34.84 kg/m. - Low-calorie diet and portion control discussed with patient.  Subjective:  Low-grade temperature overnight; no chest pain, no nausea, no vomiting overall feeling better.  Good saturation on room air.  Physical Exam: Vitals:   09/19/23 2254 09/20/23 0412 09/20/23 0650 09/20/23 1448  BP: 120/76 123/83  107/61  Pulse: (!) 101 (!) 120  84  Resp: 20 20  18   Temp: 99.7 F (37.6 C) (!) 100.9 F (38.3 C) 99.1 F (37.3 C) 98.6 F (37 C)  TempSrc: Oral Oral Oral Oral  SpO2: 100% 100%  98%  Weight:      Height:       General exam: Alert, awake, oriented x 3 Respiratory system: Good saturation on room air. Cardiovascular system: Rate controlled. No murmurs, rubs, gallops. Gastrointestinal system: Abdomen is obese, nondistended, soft and nontender. No organomegaly or masses felt. Normal bowel sounds heard. Central nervous system:  No focal neurological deficits. Extremities: No cyanosis or clubbing. Skin: No petechiae. Psychiatry: Judgement and insight appear normal. Mood & affect appropriate.    Data Reviewed: Basic metabolic panel: Sodium 136, potassium 3.9, chloride 98, bicarb 22, BUN  10, creatinine 1.14 and GFR 54 CBC: WBCs 5.5, hemoglobin 9.8 and platelet count 274K Magnesium : 1.8  Family  Communication: Daughter is at bedside.  Disposition: Status is: Observation The patient remains OBS appropriate and will d/c before 2 midnights.  Anticipating discharge back home once medically stable.  Time spent: 35 minutes  Author: Eric Nunnery, MD 09/20/2023 6:03 PM  For on call review [email or web address].

## 2023-09-20 NOTE — Progress Notes (Signed)
   09/20/23 0851  TOC Brief Assessment  Insurance and Status Reviewed  Patient has primary care physician Yes  Home environment has been reviewed From home  Prior level of function: Independent  Prior/Current Home Services No current home services  Social Drivers of Health Review SDOH reviewed no interventions necessary  Readmission risk has been reviewed Yes  Transition of care needs no transition of care needs at this time   Transition of Care Department [facility]) has reviewed patient and no TOC needs have been identified at this time. We will continue to monitor patient advancement through interdisciplinary progression rounds. If new patient transition needs arise, please place a TOC consult.

## 2023-09-20 NOTE — Plan of Care (Addendum)
 Patient remains in Afib with periodic episode of heart rate in the 140's non-sustained. EKG showed Afib.  Problem: Education: Goal: Ability to describe self-care measures that may prevent or decrease complications (Diabetes Survival Skills Education) will improve Outcome: Progressing Goal: Individualized Educational Video(s) Outcome: Progressing   Problem: Coping: Goal: Ability to adjust to condition or change in health will improve Outcome: Progressing   Problem: Fluid Volume: Goal: Ability to maintain a balanced intake and output will improve Outcome: Progressing   Problem: Health Behavior/Discharge Planning: Goal: Ability to identify and utilize available resources and services will improve Outcome: Progressing Goal: Ability to manage health-related needs will improve Outcome: Progressing   Problem: Metabolic: Goal: Ability to maintain appropriate glucose levels will improve Outcome: Progressing   Problem: Nutritional: Goal: Maintenance of adequate nutrition will improve Outcome: Progressing Goal: Progress toward achieving an optimal weight will improve Outcome: Progressing   Problem: Skin Integrity: Goal: Risk for impaired skin integrity will decrease Outcome: Progressing   Problem: Tissue Perfusion: Goal: Adequacy of tissue perfusion will improve Outcome: Progressing   Problem: Education: Goal: Knowledge of General Education information will improve Description: Including pain rating scale, medication(s)/side effects and non-pharmacologic comfort measures Outcome: Progressing   Problem: Health Behavior/Discharge Planning: Goal: Ability to manage health-related needs will improve Outcome: Progressing   Problem: Clinical Measurements: Goal: Ability to maintain clinical measurements within normal limits will improve Outcome: Progressing Goal: Will remain free from infection Outcome: Progressing Goal: Diagnostic test results will improve Outcome:  Progressing Goal: Respiratory complications will improve Outcome: Progressing Goal: Cardiovascular complication will be avoided Outcome: Progressing   Problem: Activity: Goal: Risk for activity intolerance will decrease Outcome: Progressing   Problem: Nutrition: Goal: Adequate nutrition will be maintained Outcome: Progressing   Problem: Coping: Goal: Level of anxiety will decrease Outcome: Progressing   Problem: Elimination: Goal: Will not experience complications related to bowel motility Outcome: Progressing Goal: Will not experience complications related to urinary retention Outcome: Progressing   Problem: Pain Managment: Goal: General experience of comfort will improve and/or be controlled Outcome: Progressing   Problem: Safety: Goal: Ability to remain free from injury will improve Outcome: Progressing   Problem: Skin Integrity: Goal: Risk for impaired skin integrity will decrease Outcome: Progressing

## 2023-09-20 NOTE — Plan of Care (Signed)
   Problem: Education: Goal: Ability to describe self-care measures that may prevent or decrease complications (Diabetes Survival Skills Education) will improve Outcome: Progressing

## 2023-09-21 ENCOUNTER — Other Ambulatory Visit

## 2023-09-21 ENCOUNTER — Ambulatory Visit: Admitting: Physician Assistant

## 2023-09-21 ENCOUNTER — Inpatient Hospital Stay: Admitting: Internal Medicine

## 2023-09-21 ENCOUNTER — Inpatient Hospital Stay

## 2023-09-21 ENCOUNTER — Telehealth: Payer: Self-pay | Admitting: Gastroenterology

## 2023-09-21 ENCOUNTER — Ambulatory Visit

## 2023-09-21 DIAGNOSIS — J40 Bronchitis, not specified as acute or chronic: Secondary | ICD-10-CM

## 2023-09-21 DIAGNOSIS — R509 Fever, unspecified: Secondary | ICD-10-CM | POA: Diagnosis not present

## 2023-09-21 LAB — GLUCOSE, CAPILLARY
Glucose-Capillary: 125 mg/dL — ABNORMAL HIGH (ref 70–99)
Glucose-Capillary: 149 mg/dL — ABNORMAL HIGH (ref 70–99)

## 2023-09-21 MED ORDER — OLMESARTAN MEDOXOMIL 40 MG PO TABS: 20.0000 mg | ORAL_TABLET | Freq: Every day | ORAL | Status: DC

## 2023-09-21 MED ORDER — KLOR-CON M20 20 MEQ PO TBCR: 20.0000 meq | EXTENDED_RELEASE_TABLET | Freq: Every day | ORAL | Status: DC | PRN

## 2023-09-21 MED ORDER — CEFDINIR 300 MG PO CAPS: 300.0000 mg | ORAL_CAPSULE | Freq: Two times a day (BID) | ORAL | 0 refills | Status: AC

## 2023-09-21 NOTE — Discharge Summary (Signed)
 Physician Discharge Summary   [name] MRN: [id number] DOB: [birth year]  Admit date:     09/18/2023  Discharge date: 09/21/23  Discharge Physician: Becky [name]   PCP: Becky Satterfield, MD   Recommendations at discharge:  {Tip this will not be part of the note when signed- Example include specific recommendations for outpatient follow-up, pending tests to follow-up on. (Optional):26781}  ***  Discharge Diagnoses: Principal Problem:   Fever Active Problems:   Bronchitis  Resolved Problems:   * No resolved hospital problems. [facility] Course: No notes on file  Assessment and Plan: No notes have been filed under this hospital service. Service: Hospitalist     {Tip this will not be part of the note when signed Body mass index is 34.84 kg/m. , ,  (Optional):26781}  {(NOTE) Pain control PDMP Statment (Optional):26782} Consultants: *** Procedures performed: ***  Disposition: {Plan; Disposition:26390} Diet recommendation:  Discharge Diet Orders (From admission, onward)     Start     Ordered   09/21/23 0000  Diet - low sodium heart healthy        09/21/23 1321           {Diet_Plan:26776} DISCHARGE MEDICATION: Allergies as of 09/21/2023       Reactions   Apresoline  [hydralazine ] Nausea Only   Latex Other (See Comments)   Unknown    Other Other (See Comments)   Band aids discolor skin EKG pads irritate skin    Prednisone  Swelling   Patient states that she can take methylprednisolone  without complications She has tolerated PO dexamethasone  well. No complaints of swelling voiced from patient.        Medication List     STOP taking these medications    azelastine  0.1 % nasal spray Commonly known as: ASTELIN    promethazine -dextromethorphan  6.25-15 MG/5ML syrup Commonly known as: PROMETHAZINE -DM   spironolactone  25 MG tablet Commonly known as: ALDACTONE        TAKE these medications    acetaminophen  500 MG tablet Commonly known  as: TYLENOL  Take 1,000 mg by mouth every 6 (six) hours as needed for mild pain (pain score 1-3).   albuterol  108 (90 Base) MCG/ACT inhaler Commonly known as: VENTOLIN  HFA Inhale 2 puffs into the lungs every 4 (four) hours as needed for wheezing or shortness of breath.   aspirin  EC 81 MG tablet Take 1 tablet (81 mg total) by mouth daily. Swallow whole.   cefdinir  300 MG capsule Commonly known as: OMNICEF  Take 1 capsule (300 mg total) by mouth 2 (two) times daily for 5 days.   dexlansoprazole  60 MG capsule Commonly known as: DEXILANT  Take 1 capsule (60 mg total) by mouth daily.   EPINEPHrine  0.3 mg/0.3 mL Soaj injection Commonly known as: EPI-PEN Inject 0.3 mg into the muscle once as needed for anaphylaxis.   folic acid  1 MG tablet Commonly known as: FOLVITE  Take 1 tablet (1 mg total) by mouth daily.   glimepiride 2 MG tablet Commonly known as: AMARYL Take 2 mg by mouth daily with breakfast.   Klor-Con  M20 20 MEQ tablet Generic drug: potassium chloride  SA Take 1 tablet (20 mEq total) by mouth daily as needed. Pt takes as needed when she has cramps   lidocaine -prilocaine  cream Commonly known as: EMLA  APPLY TOPICALLY TO AFFECTED AREAS ONCE DAILY What changed: See the new instructions.   linaclotide  72 MCG capsule Commonly known as: Linzess  Take 1 capsule (72 mcg total) by mouth daily before breakfast.   LORazepam  0.5 MG tablet Commonly known  as: ATIVAN  Take 1 tablet (0.5 mg total) by mouth every 8 (eight) hours as needed for anxiety. Take 1 hour prior to radiation treatment   metoprolol  tartrate 25 MG tablet Commonly known as: LOPRESSOR  Take 12.5 mg by mouth 2 (two) times daily.   montelukast  10 MG tablet Commonly known as: SINGULAIR  Take 10 mg by mouth at bedtime.   neomycin-polymyxin b-dexamethasone  3.5-10000-0.1 Susp Commonly known as: MAXITROL Place 4 drops into the right eye daily.   NON FORMULARY Pt uses a cpap nightly   olmesartan  40 MG  tablet Commonly known as: Benicar  Take 0.5 tablets (20 mg total) by mouth daily.   ondansetron  8 MG tablet Commonly known as: ZOFRAN  TAKE 1 TABLET BY MOUTH EVERY 8 HOURS FOR NAUSEA & VOMITING, START ON THE THIRD DAY AFTER CHEMOTHERAPY What changed: See the new instructions.   polyethylene glycol 17 g packet Commonly known as: MiraLax  Take 17 g by mouth daily. What changed: additional instructions   prochlorperazine  10 MG tablet Commonly known as: COMPAZINE  Take 1 tablet (10 mg total) by mouth every 6 (six) hours as needed. What changed: reasons to take this   Restasis  0.05 % ophthalmic emulsion Generic drug: cycloSPORINE  Place 1 drop into both eyes daily as needed (dry eyes).   rivaroxaban  20 MG Tabs tablet Commonly known as: Xarelto  Take 1 tablet (20 mg total) by mouth daily.   rosuvastatin  20 MG tablet Commonly known as: CRESTOR  Take 1 tablet (20 mg total) by mouth daily.   Senexon-S 8.6-50 MG tablet Generic drug: senna-docusate Take 1 tablet by mouth at bedtime.   Synthroid  25 MCG tablet Generic drug: levothyroxine  Take 25 mcg by mouth daily before breakfast.   Tiadylt  ER 360 MG 24 hr capsule Generic drug: diltiazem  TAKE ONE (1) CAPSULE BY MOUTH ONCE DAILY *NEW PRESCRIPTION REQUEST* What changed: See the new instructions.        Follow-up Information     Becky Satterfield, MD. Schedule an appointment as soon as possible for a visit in 10 day(s).   Specialty: Internal Medicine Contact information: [address] [facility] [place] [address] [phone]                Discharge Exam: Becky [name]   09/18/23 2022  Weight: 100.9 kg   ***  Condition at discharge: {DC Condition:26389}  The results of significant diagnostics from this hospitalization (including imaging, microbiology, ancillary and laboratory) are listed below for reference.   Imaging Studies: CT CHEST ABDOMEN PELVIS W CONTRAST Result Date: 09/19/2023 CLINICAL DATA:  Known  lung mass and recent history of diverticulitis EXAM: CT CHEST, ABDOMEN, AND PELVIS WITH CONTRAST TECHNIQUE: Multidetector CT imaging of the chest, abdomen and pelvis was performed following the standard protocol during bolus administration of intravenous contrast. RADIATION DOSE REDUCTION: This exam was performed according to the departmental dose-optimization program which includes automated exposure control, adjustment of the mA and/or kV according to patient size and/or use of iterative reconstruction technique. CONTRAST:  OMNIPAQUE  IOHEXOL  300 MG/ML  SOLN COMPARISON:  05/28/2023, 09/07/2023 FINDINGS: CT CHEST FINDINGS Cardiovascular: Thoracic aorta shows atherosclerotic calcifications without aneurysmal dilatation or dissection. Heart is at the upper limits of normal in size. Left chest wall port is seen in satisfactory position. The pulmonary artery as visualized is within normal limits. Mediastinum/Nodes: Thoracic inlet is within normal limits. No hilar or mediastinal adenopathy is noted. The esophagus as visualized is within normal limits. Lungs/Pleura: Lungs are well aerated bilaterally. No sizable effusion is seen. Mass lesion is noted  in the posterior aspect of the right upper lobe measuring 2.6 x 1.7 cm somewhat decreased in size from the prior CT at which time it measured 3.6 by 2.3 cm. Some associated perihilar opacity is seen similar to prior exams. No associated nodules are noted. Musculoskeletal: No acute rib abnormality is noted. No acute bony abnormality is seen. CT ABDOMEN PELVIS FINDINGS Hepatobiliary: Mild fatty infiltration of the liver is noted. The gallbladder is within normal limits. Pancreas: Unremarkable. No pancreatic ductal dilatation or surrounding inflammatory changes. Spleen: Normal in size without focal abnormality. Adrenals/Urinary Tract: Right adrenal gland is within normal limits. Left adrenal gland shows a 17 mm nodule similar to that seen on the recent exam and with  greater than 1 year stability. Kidneys demonstrate no renal calculi. Simple cyst is noted within the right kidney. No follow-up is recommended. The bladder is well distended. Stomach/Bowel: Scattered diverticular change of the colon is noted. The previously seen diverticulitis has resolved. Prominent air-filled diverticulum is seen along the sigmoid although no inflammatory changes are noted. The more proximal colon is within normal limits. The appendix is unremarkable. Small bowel and stomach are within normal limits. Vascular/Lymphatic: Aortic atherosclerosis. No enlarged abdominal or pelvic lymph nodes. Reproductive: Fibroid change is noted within the uterus. No adnexal mass is seen. Other: No abdominal wall hernia or abnormality. No abdominopelvic ascites. Musculoskeletal: Degenerative changes of lumbar spine are noted. IMPRESSION: CT of the chest: Persistent right upper lobe mass lesion with associated perihilar opacity. The mass has decreased in size when compared with the prior exam of 05/28/2023. No other focal abnormality is noted. 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 No follow-up is recommended. Diverticulosis without diverticulitis. The previously seen inflammatory change has resolved completely. No extraluminal air is noted. Electronically Signed   By: Oneil Devonshire M.D.   On: 09/19/2023 00:15   DG Chest 2 View Result Date: 09/18/2023 CLINICAL DATA:  sob EXAM: CHEST - 2 VIEW COMPARISON:  Chest x-ray 09/18/2023 FINDINGS: Left chest wall Port-A-Cath in similar position with tip overlying the right atrium. The heart and mediastinal contours are within normal limits. Persistent right upper lobe architectural distortion with underlying lesion not well visualized on this study. No pulmonary edema. No pleural effusion. No pneumothorax. No acute osseous abnormality. IMPRESSION: 1. No active cardiopulmonary disease. 2. Persistent right upper lobe architectural  distortion with underlying lesion not well visualized on this study. Electronically Signed   By: Morgane  Naveau M.D.   On: 09/18/2023 21:14   DG Chest Portable 1 View Result Date: 09/18/2023 EXAM: 1 VIEW XRAY OF THE CHEST 09/18/2023 04:18:00 AM COMPARISON: 07/12/2023 CLINICAL HISTORY: Fever cough. Fever, cough FINDINGS: LUNGS AND PLEURA: Right upper lobe lung nodule measures 2.1 x 1.5 cm with adjacent scarring and architectural distortion. This corresponds to the patient's known tracer avid lung mass. No pleural effusion, pneumothorax or acute airspace consolidation. No signs of pulmonary edema. HEART AND MEDIASTINUM: No acute abnormality of the cardiac and mediastinal silhouettes. BONES AND SOFT TISSUES: Left chest wall port-a-catheter is noted with tip in the distal SVC. No acute osseous abnormality. IMPRESSION: 1. No acute findings. 2. Right upper lobe lung nodule measuring 2.1 x 1.5 cm with adjacent scarring and architectural distortion, corresponding to the patient's known FDG avid lung mass. Electronically signed by: Waddell Calk MD 09/18/2023 05:35 AM EDT RP Workstation: [id number]   CT ABDOMEN PELVIS W CONTRAST Result Date: 09/07/2023 EXAM: CT ABDOMEN AND PELVIS WITH CONTRAST 09/07/2023 01:04:41 PM TECHNIQUE:  CT of the abdomen and pelvis was performed with the administration of intravenous contrast. Multiplanar reformatted images are provided for review. Automated exposure control, iterative reconstruction, and/or weight-based adjustment of the mA/kV was utilized to reduce the radiation dose to as low as reasonably achievable. COMPARISON: CT of the abdomen and pelvis 01/29/2021. CLINICAL HISTORY: Abdominal pain, acute, nonlocalized. Pt c/o lower abdominal cramping starting on Friday. Pt states she took something to have a BM with success. Pt denies being around anyone sick that she knows of. Pt states nausea no vomiting. Pt states putting warm compresses on her abdomen yesterday; that helped ease pain  some. Pt is stage 4 lung cancer pt that has spread to kidneys and ribs. FINDINGS: LOWER CHEST: Visualized lung bases are clear. LIVER: Diffuse fatty infiltration of the liver is again noted. GALLBLADDER AND BILE DUCTS: No wall thickening. No cholelithiasis. No biliary ductal dilatation. SPLEEN: Normal size. No focal lesion. PANCREAS: No mass. No ductal dilatation. ADRENAL GLANDS: A 15 mm left adrenal lesion is stable. Venous phase imaging average measurement is 53 Hounsfield units. The mean density is 37 Hounsfield units on delayed images. KIDNEYS, URETERS AND BLADDER: A simple cyst at the lower pole of the right kidney measures 2.5 cm. Per consensus, no follow-up is needed for simple Bosniak type 1 and 2 renal cysts, unless the patient has a malignancy history or risk factors. No stones in the kidneys or ureters. No hydronephrosis. No perinephric or periureteral stranding. Urinary bladder is unremarkable. GI AND BOWEL: Diverticular changes are present in the ascending colon without focal inflammation. Diverticular changes are present in the descending and sigmoid colon. Diffuse inflammatory changes are present in the distal sigmoid colon. Likely also free air may be extraluminal. No discrete fluid collection is present. PERITONEUM AND RETROPERITONEUM: No ascites. VASCULATURE: Atherosclerotic calcifications are present in the aorta and branch vessels. No aneurysm is present. LYMPH NODES: No lymphadenopathy. REPRODUCTIVE ORGANS: Fibroid uterus is noted. BONES AND SOFT TISSUES: No acute osseous abnormality. No focal soft tissue abnormality. IMPRESSION: 1. Diverticular changes in the ascending, descending, and sigmoid colon with diffuse inflammatory changes in the distal sigmoid colon consistent with focal colitis, I will likely related to diverticular disease. Locules of free air may be extraluminal, suggesting a contained perforation. 2. Stable 15 mm left adrenal lesion with venous phase average measurement of 53  Hounsfield units and mean density of 37 Hounsfield units on delayed images. 30% washout is indeterminate. This is a probable adenoma. Given stability of greater than 1 year, no further follow-up imaging is recommended Electronically signed by: Lonni Necessary MD 09/07/2023 01:23 PM EDT RP Workstation: [id number]    Microbiology: Results for orders placed or performed during the hospital encounter of 09/18/23  Blood culture (routine x 2)     Status: None (Preliminary result)   Collection Time: 09/18/23  9:32 PM   Specimen: Right Antecubital; Blood  Result Value Ref Range Status   Specimen Description RIGHT ANTECUBITAL  Final   Special Requests   Final    BOTTLES DRAWN AEROBIC AND ANAEROBIC Blood Culture adequate volume   Culture   Final    NO GROWTH 3 DAYS Performed at [facility], [address] [place], [place] [address]    Report Status PENDING  Incomplete  Blood culture (routine x 2)     Status: None (Preliminary result)   Collection Time: 09/18/23  9:42 PM   Specimen: Left Antecubital; Blood  Result Value Ref Range Status   Specimen Description LEFT ANTECUBITAL  Final  Special Requests   Final    BOTTLES DRAWN AEROBIC AND ANAEROBIC Blood Culture adequate volume   Culture   Final    NO GROWTH 3 DAYS Performed at [facility], [address], [place], [place] [address]    Report Status PENDING  Incomplete  Resp panel by RT-PCR (RSV, Flu A&B, Covid) Anterior Nasal Swab     Status: None   Collection Time: 09/19/23  7:18 AM   Specimen: Anterior Nasal Swab  Result Value Ref Range Status   SARS Coronavirus 2 by RT PCR NEGATIVE NEGATIVE Final    Comment: (NOTE) SARS-CoV-2 target nucleic acids are NOT DETECTED.  The SARS-CoV-2 RNA is generally detectable in upper respiratory specimens during the acute phase of infection. The lowest concentration of SARS-CoV-2 viral copies this assay can detect is 138 copies/mL. A negative result does not preclude SARS-Cov-2 infection and  should not be used as the sole basis for treatment or other patient management decisions. A negative result may occur with  improper specimen collection/handling, submission of specimen other than nasopharyngeal swab, presence of viral mutation(s) within the areas targeted by this assay, and inadequate number of viral copies(<138 copies/mL). A negative result must be combined with clinical observations, patient history, and epidemiological information. The expected result is Negative.  Fact Sheet for Patients:  BloggerCourse.com  Fact Sheet for Healthcare Providers:  SeriousBroker.it  This test is no t yet approved or cleared by the [place]  FDA and  has been authorized for detection and/or diagnosis of SARS-CoV-2 by FDA under an Emergency Use Authorization (EUA). This EUA will remain  in effect (meaning this test can be used) for the duration of the COVID-19 declaration under Section 564(b)(1) of the Act, 21 U.S.C.section 360bbb-3(b)(1), unless the authorization is terminated  or revoked sooner.       Influenza A by PCR NEGATIVE NEGATIVE Final   Influenza B by PCR NEGATIVE NEGATIVE Final    Comment: (NOTE) The Xpert Xpress SARS-CoV-2/FLU/RSV plus assay is intended as an aid in the diagnosis of influenza from Nasopharyngeal swab specimens and should not be used as a sole basis for treatment. Nasal washings and aspirates are unacceptable for Xpert Xpress SARS-CoV-2/FLU/RSV testing.  Fact Sheet for Patients: BloggerCourse.com  Fact Sheet for Healthcare Providers: SeriousBroker.it  This test is not yet approved or cleared by the [place]  FDA and has been authorized for detection and/or diagnosis of SARS-CoV-2 by FDA under an Emergency Use Authorization (EUA). This EUA will remain in effect (meaning this test can be used) for the duration of the COVID-19 declaration  under Section 564(b)(1) of the Act, 21 U.S.C. section 360bbb-3(b)(1), unless the authorization is terminated or revoked.     Resp Syncytial Virus by PCR NEGATIVE NEGATIVE Final    Comment: (NOTE) Fact Sheet for Patients: BloggerCourse.com  Fact Sheet for Healthcare Providers: SeriousBroker.it  This test is not yet approved or cleared by the [place]  FDA and has been authorized for detection and/or diagnosis of SARS-CoV-2 by FDA under an Emergency Use Authorization (EUA). This EUA will remain in effect (meaning this test can be used) for the duration of the COVID-19 declaration under Section 564(b)(1) of the Act, 21 U.S.C. section 360bbb-3(b)(1), unless the authorization is terminated or revoked.  Performed at [facility], [address], [place], [place] [address]    *Note: Due to a large number of results and/or encounters for the requested time period, some results have not been displayed. A complete set of results can be found in  Results Review.    Labs: CBC: Recent Labs  Lab 09/14/23 1444 09/18/23 0410 09/18/23 2132 09/19/23 0556 09/20/23 0505  WBC 4.5 5.5 6.0 4.6 5.5  NEUTROABS 2.8 3.4 3.7  --   --   HGB 10.2* 9.9* 10.7* 9.6* 9.8*  HCT 29.3* 29.2* 31.4* 28.9* 29.1*  MCV 89.9 93.3 93.5 94.4 92.4  PLT 134* 235 267 256 274   Basic Metabolic Panel: Recent Labs  Lab 09/14/23 1444 09/18/23 0410 09/18/23 2314 09/19/23 0556 09/20/23 0505  NA 138 133* 131* 136 130*  K 3.4* 3.8 3.9 3.9 3.9  CL 104 100 97* 102 98  CO2 29 23 23 24 22   GLUCOSE 173* 121* 120* 162* 121*  BUN 13 10 11 10 10   CREATININE 0.76 0.86 1.03* 1.05* 1.14*  CALCIUM  8.8* 8.6* 9.0 8.8* 8.8*  MG  --   --   --  1.7 1.8  PHOS  --   --   --  2.8  --    Liver Function Tests: Recent Labs  Lab 09/14/23 1444 09/18/23 0410 09/18/23 2314  AST 22 26 26   ALT 39 33 34  ALKPHOS 74 54 51  BILITOT 0.3 0.5 0.8  PROT 6.4* 6.5 6.9  ALBUMIN 3.3* 3.0*  3.0*   CBG: Recent Labs  Lab 09/20/23 1124 09/20/23 1642 09/20/23 2101 09/21/23 0724 09/21/23 1148  GLUCAP 166* 117* 149* 125* 149*    Discharge time spent: {LESS THAN/GREATER THAN:26388} 30 minutes.  Signed: Eric Nunnery, MD [facility] 09/21/2023

## 2023-09-22 ENCOUNTER — Encounter: Payer: Self-pay | Admitting: Nurse Practitioner

## 2023-09-22 ENCOUNTER — Other Ambulatory Visit: Payer: Self-pay | Admitting: Physician Assistant

## 2023-09-22 ENCOUNTER — Ambulatory Visit (INDEPENDENT_AMBULATORY_CARE_PROVIDER_SITE_OTHER): Admitting: Nurse Practitioner

## 2023-09-22 VITALS — BP 100/66 | HR 86 | Ht 67.0 in | Wt 247.8 lb

## 2023-09-22 DIAGNOSIS — R29898 Other symptoms and signs involving the musculoskeletal system: Secondary | ICD-10-CM | POA: Insufficient documentation

## 2023-09-22 DIAGNOSIS — I38 Endocarditis, valve unspecified: Secondary | ICD-10-CM | POA: Insufficient documentation

## 2023-09-22 DIAGNOSIS — Z87898 Personal history of other specified conditions: Secondary | ICD-10-CM | POA: Insufficient documentation

## 2023-09-22 DIAGNOSIS — E785 Hyperlipidemia, unspecified: Secondary | ICD-10-CM | POA: Insufficient documentation

## 2023-09-22 DIAGNOSIS — I10 Essential (primary) hypertension: Secondary | ICD-10-CM | POA: Insufficient documentation

## 2023-09-22 DIAGNOSIS — N1831 Chronic kidney disease, stage 3a: Secondary | ICD-10-CM | POA: Insufficient documentation

## 2023-09-22 DIAGNOSIS — I480 Paroxysmal atrial fibrillation: Secondary | ICD-10-CM | POA: Insufficient documentation

## 2023-09-22 DIAGNOSIS — Z7409 Other reduced mobility: Secondary | ICD-10-CM | POA: Insufficient documentation

## 2023-09-22 DIAGNOSIS — M6281 Muscle weakness (generalized): Secondary | ICD-10-CM | POA: Insufficient documentation

## 2023-09-22 DIAGNOSIS — C3491 Malignant neoplasm of unspecified part of right bronchus or lung: Secondary | ICD-10-CM

## 2023-09-22 MED ORDER — OLMESARTAN MEDOXOMIL 20 MG PO TABS: 20.0000 mg | ORAL_TABLET | Freq: Every day | ORAL | 1 refills | Status: DC

## 2023-09-22 NOTE — Progress Notes (Signed)
 Cardiology Office Note:  .   Date: 09/22/2023 ID:  Becky [name], DOB [birth year], MRN [id number] PCP: Zarwolo, Gloria, FNP  [facility] Providers Cardiologist:  Alvan Carrier, MD Electrophysiologist:  OLE ONEIDA HOLTS, MD  PV Cardiologist:  Gordy Bergamo, MD    History of Present Illness: .   Becky [name] is a 63 y.o. female with a PMH of chest pain, PAF, HTN, HLD, shortness of breath/DOE, OSA, cough, dizziness, mild to moderate AI, CKD, and leg edema/leg pains, who presents today for 4 week follow-up.   Last seen by Dr. Carrier Alvan on March 25, 2022.  At that time, she noticed some increased symptoms of A-fib during some previous extended issues with respiratory tract infections, palpitations were resolving.  At that time, she noted some recurrent GI bleeding and was instructed to hold Xarelto  for 3 days and resume if her issues resolved.  At that time, she had a GI appointment the following week.  Recommended to that may need to reconsider Watchman, she has started evaluation.  ABIs were ordered due to her reported leg pains.  ED visit on April 05, 2023 for rectal bleeding. Fecal occult was positive.  H&H: 11.1/33.9.  Was discharged from ED with GI follow-up.  She was off her Xarelto  for 3 days as instructed.  Hospitalized early April 2025 due to complaints of numbness along right hand, numbness along left toes, also noted intermittent episodes of dizziness, headache, and blurred vision.  She also continues to note bloody stools.  No melena noted.  Xarelto  was held and was noted the last time she took medication was on March 27, 2023.  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  Noted to have nonbleeding internal hemorrhoids, some diverticulosis, recommended to have repeat colonoscopy in 5 years and remain on MiraLAX  daily.  Per GI, she was okay to  resume OAC.  ED visit April 13, 2023 for dizziness.  Stated the symptoms felt similar before previous hospitalization when diagnosed with a stroke.  Reported being compliant with Xarelto .  Noted intermittent blurry vision and dizziness.  CT of the head was negative for anything acute.  Blood work was unremarkable.  Contacted our office on April 18 noting intermittent lightheadedness, noting labile heart rates, self decreased metoprolol  tartrate to 25 mg twice daily, seemed to help her blood pressure and heart rate from dropping so much.  Denied syncope. Laymon Qua, PA-C recommended to further reduce Lopressor  to 12.5 mg BID.   05/06/2023 - Today she presents for scheduled follow-up.  She states she is doing better than previously.  Tells me she had 3 days of some bleeding noticed after her colonoscopy, then the bleeding stopped.  She denies any recent recurrent GI bleeding.  Admits to some lower SBP readings, confirms SBP ranging from 95- 118.  Notices heart rate dropping at nighttime, 40's at times even after lowering Lopressor  to 12.5 mg BID, HR on average is between 53-63 bpm.  She is compliant with her CPAP usage.  Admits to some lightheadedness and dizziness triggered by lights and stimuli from her phone.  Says she has been diagnosed with vertigo in the past, has tried medicine for vertigo previously.  Currently taking olmesartan  20 mg daily. Denies any chest pain, shortness of breath, palpitations, syncope, presyncope, orthopnea, PND, swelling or significant weight changes, or claudication.  06/22/2023 -  Here for follow-up. BP readings are  labile per her report. She tells me of her new diagnosis of lung cancer, coping well. Will be seeing her oncologist on June 30, 2023. Does admit to some nausea with her chemotherapy. Does admit to intermittent dizziness, some improved compared to last office visit. Continues to note lightheadedness. Denies any chest pain, shortness of breath, palpitations,  syncope, presyncope, orthopnea, PND, swelling or significant weight changes, acute bleeding, or claudication.  Recently hospitalized d/t acute sepsis r/t diverticulits with contained performatin, tx with ABX and d/c on ABX. Recent suspected viral URI. Returned with recurrent fever without source. EKG showed rate controlled A-fib.   09/22/2023 - Here for follow-up. She admits to feeling tired and intermittent lightheadedness, now taking 40 mg of olmesartan  daily when she was previously taking 20 mg of olmesartan  daily. Denies any chest pain, shortness of breath, palpitations, syncope, presyncope, dizziness, orthopnea, PND, swelling or significant weight changes, acute bleeding, or claudication.  ROS: Negative. See HPI.   Studies Reviewed: SABRA    EKG: EKG is not ordered today.   Echo bubble study limited 07/2023:   1. Left ventricular ejection fraction, by estimation, is 55 to 60%. The  left ventricle has normal function. The left ventricle has no regional  wall motion abnormalities.   2. Right ventricular systolic function is normal. The right ventricular  size is normal.   3. The mitral valve is normal in structure. No evidence of mitral valve  regurgitation. No evidence of mitral stenosis.   4. The aortic valve was not well visualized. Aortic valve regurgitation  is moderate. No aortic stenosis is present.   5. Agitated saline contrast bubble study was negative, with no evidence  of any interatrial shunt.   6. Limited echo   Echo complete bubble study 07/2023:   1. Left ventricular ejection fraction, by estimation, is 55 to 60%. The  left ventricle has normal function. The left ventricle has no regional  wall motion abnormalities. There is mild left ventricular hypertrophy.  Left ventricular diastolic parameters  are indeterminate.   2. Right ventricular systolic function was not well visualized. The right  ventricular size is not well visualized.   3. The mitral valve is normal in  structure. No evidence of mitral valve  regurgitation. No evidence of mitral stenosis.   4. Cannot rule out mobile echodensity on the non coronary cusp. The  aortic valve is tricuspid. There is moderate calcification of the aortic  valve. Aortic valve regurgitation is moderate. Aortic valve sclerosis is  present, with no evidence of aortic  valve stenosis.   5. The inferior vena cava is dilated in size with >50% respiratory  variability, suggesting right atrial pressure of 8 mmHg.   6. Agitated saline contrast bubble study was negative, with no evidence  of any interatrial shunt.   Arterial duplex 06/2023:  Summary:  Right: Atherosclerotic plaque noted throughout the left lower extremity  with dampened monphasic doppler waveforms starting at the level of the  popliteal artery. May suggest hemodynamically significant stenosis at the  popliteal level.  Right ABI mildly reduced at 0.75   Left: Left: Atherosclerotic plaque noted throughout the left lower  extremity with dampened monphasic doppler waveforms starting at the level  of the popliteal artery. May suggest hemodynamically significant stenosis  at the popliteal artery level.  Left ABI moderately reduced at 0.55.     See table(s) above for measurements and observations.   Suggest follow up study in 12 months.  Suggest Peripheral Vascular Consult.   ABI's  06/2023:  Summary:  Right: Resting right ankle-brachial index indicates moderate right lower  extremity arterial disease. The right toe-brachial index is abnormal.   Left: Resting left ankle-brachial index indicates moderate left lower  extremity arterial disease. The left toe-brachial index is abnormal.    *See table(s) above for measurements and observations.    Suggest follow up study in 12 months.   ABI's 04/28/2023:  Summary:  Right: Resting right ankle-brachial index indicates moderate right lower  extremity arterial disease. The right toe-brachial index is  abnormal.   Left: Resting left ankle-brachial index indicates moderate left lower  extremity arterial disease. The left toe-brachial index is abnormal.    *See table(s) above for measurements and observations.    Suggest follow up study in 12 months.  Echo 04/2023:  1. Left ventricular ejection fraction, by estimation, is 60 to 65%. The  left ventricle has normal function. The left ventricle has no regional  wall motion abnormalities. Left ventricular diastolic parameters are  consistent with Grade II diastolic  dysfunction (pseudonormalization). Elevated left atrial pressure.   2. Right ventricular systolic function is normal. The right ventricular  size is normal. Tricuspid regurgitation signal is inadequate for assessing  PA pressure.   3. Left atrial size was mildly dilated.   4. The mitral valve is normal in structure. Trivial mitral valve  regurgitation. No evidence of mitral stenosis.   5. The aortic valve is tricuspid. Aortic valve regurgitation is mild to  moderate. No aortic stenosis is present.   6. The inferior vena cava is dilated in size with >50% respiratory  variability, suggesting right atrial pressure of 8 mmHg.  CT cardiac 03/2021:  IMPRESSION: 1. The left atrial appendage is a large chicken wing morphology without thrombus.   2. A 27 mm Watchman FLX device is recommended based on the above landing zone measurements (20.0 mm maximum diameter; 26% compression).   3. There is no thrombus in the left atrial appendage.   4. A mid/mid IAS puncture site is recommended.   5. Optimal deployment angle: RAO 1 CRA 23   6. Normal coronary origin. Right dominance. CAC score of 148, which is 94th percentile for age-, sex-, and race-matched controls.  LHC 07/2019:    Prox RCA to Mid RCA lesion is 25% stenosed. Prox LAD to Mid LAD lesion is 25% stenosed. The left ventricular systolic function is normal. The left ventricular ejection fraction is 55-65% by visual  estimate. LV end diastolic pressure is normal. LVEDP 15 mm Hg. There is no aortic valve stenosis.   Continue medical therapy.     Of note, patient was in AFib during the cath.  Rate was controlled and she did not feel palpitations.    Physical Exam:   VS:  BP 100/66 (BP Location: Left Arm)   Pulse 86   Ht 5' 7 (1.702 m)   Wt 247 lb 12.8 oz (112.4 kg)   SpO2 99%   BMI 38.81 kg/m    Wt Readings from Last 3 Encounters:  09/22/23 247 lb 12.8 oz (112.4 kg)  09/18/23 222 lb 7.1 oz (100.9 kg)  09/18/23 222 lb 7.1 oz (100.9 kg)    GEN: Obese, 63 y.o. female in no acute distress NECK: No JVD; No carotid bruits CARDIAC: S1/S2, RRR, no murmurs, rubs, gallops RESPIRATORY:  Clear to auscultation without rales, wheezing or rhonchi  ABDOMEN: Soft, non-tender, non-distended EXTREMITIES:  No edema; No deformity   ASSESSMENT AND PLAN: .   PAF, hx of bradycardia Denies  any tachycardia or palpitations. HR is well controlled today. No more GI bleeding per her report. Continue diltiazem . Continue Xarelto  for stroke prevention. On appropriate dosage. Denies any recent bleeding issues. Heart healthy diet and regular cardiovascular exercise encouraged.   HTN BP soft, intermittent lightheadedness with this.  Discussed to monitor BP at home at least 2 hours after medications and sitting for 5-10 minutes. Will reduce olmesartan  to 20 mg daily. Heart healthy diet and regular cardiovascular exercise encouraged.   HLD Most recent LDL 93. Continue rosuvastatin . Heart healthy diet and regular cardiovascular exercise encouraged.   Valvular insufficiency Mod AR noted on most recent Echo, no interatrial shunting noted on bubble study. Denies any symptoms. Plan to update Echo in 2 years or sooner if clinically indicated.   CKD stage 3a Most recent labs show stable kidney disease. Avoid nephrotoxic agents. Continue current medication regimen. Continue to follow with PCP.   Dispo: Follow-up with Dr. Dorn Ross or APP in 3 months or sooner if anything changes.   Signed, Almarie Crate, NP

## 2023-09-22 NOTE — Patient Instructions (Signed)
 Medication Instructions:  Your physician has recommended you make the following change in your medication:  Please decrease Olmesartan  to 20 Mg daily   Labwork: None   Testing/Procedures: None   Follow-Up: Your physician recommends that you schedule a follow-up appointment in: 4-6 weeks   Any Other Special Instructions Will Be Listed Below (If Applicable).  If you need a refill on your cardiac medications before your next appointment, please call your pharmacy.

## 2023-09-23 ENCOUNTER — Telehealth: Payer: Self-pay | Admitting: Medical Oncology

## 2023-09-23 ENCOUNTER — Other Ambulatory Visit (HOSPITAL_COMMUNITY)
Admission: RE | Admit: 2023-09-23 | Discharge: 2023-09-23 | Disposition: A | Discharge status: Home / Self Care | Source: Ambulatory Visit | Attending: Adult Health | Admitting: Adult Health

## 2023-09-23 ENCOUNTER — Encounter: Payer: Self-pay | Admitting: Adult Health

## 2023-09-23 ENCOUNTER — Ambulatory Visit (INDEPENDENT_AMBULATORY_CARE_PROVIDER_SITE_OTHER): Admitting: Adult Health

## 2023-09-23 VITALS — BP 96/67 | HR 93 | Temp 98.7°F | Ht 67.0 in | Wt 243.5 lb

## 2023-09-23 DIAGNOSIS — Z8673 Personal history of transient ischemic attack (TIA), and cerebral infarction without residual deficits: Secondary | ICD-10-CM

## 2023-09-23 DIAGNOSIS — C3491 Malignant neoplasm of unspecified part of right bronchus or lung: Secondary | ICD-10-CM

## 2023-09-23 DIAGNOSIS — Z1151 Encounter for screening for human papillomavirus (HPV): Secondary | ICD-10-CM

## 2023-09-23 DIAGNOSIS — R319 Hematuria, unspecified: Secondary | ICD-10-CM | POA: Diagnosis not present

## 2023-09-23 DIAGNOSIS — Z01419 Encounter for gynecological examination (general) (routine) without abnormal findings: Secondary | ICD-10-CM | POA: Insufficient documentation

## 2023-09-23 DIAGNOSIS — I639 Cerebral infarction, unspecified: Secondary | ICD-10-CM

## 2023-09-23 DIAGNOSIS — Z87891 Personal history of nicotine dependence: Secondary | ICD-10-CM

## 2023-09-23 DIAGNOSIS — Z1231 Encounter for screening mammogram for malignant neoplasm of breast: Secondary | ICD-10-CM | POA: Insufficient documentation

## 2023-09-23 LAB — POCT URINALYSIS DIPSTICK
Glucose, UA: NEGATIVE
Ketones, UA: NEGATIVE
Nitrite, UA: NEGATIVE
Protein, UA: POSITIVE — AB

## 2023-09-23 LAB — CULTURE, BLOOD (ROUTINE X 2)
Culture: NO GROWTH
Culture: NO GROWTH
Culture: NO GROWTH
Special Requests: ADEQUATE
Special Requests: ADEQUATE
Special Requests: ADEQUATE

## 2023-09-23 NOTE — Progress Notes (Signed)
 Patient ID: Becky [name], female   DOB: April 29, 1960, 63 y.o.   MRN: [id number] History of Present Illness: Becky [name] is a 63 year old black female,married, PM in for a well woman gyn exam and pap. She has been diagnosed with stage 4 lung cancer and is getting chemo and radiation and has had 3 strokes this year, she had stopped xarelto  due to rectal bleeding.  Last pap was ASCUS , negative HPV 04/07/22.  PCP was Dr Bertell to start seeing Becky Zarwolo NP, has appt tomorrow she says   Current Medications, Allergies, Past Medical History, Past Surgical History, Family History and Social History were reviewed in [employer] record.     Review of Systems: Patient denies any headaches, hearing loss, fatigue, blurred vision, shortness of breath, chest pain, abdominal pain, problems with bowel movements, urination, or intercourse(not active). No joint pain or mood swings.  She has some right side weakness Changed soap and felt irritated   Physical Exam:BP 96/67 (BP Location: Left Arm, Patient Position: Sitting, Cuff Size: Large)   Pulse 93   Temp 98.7 F (37.1 C)   Ht 5' 7 (1.702 m)   Wt 243 lb 8 oz (110.5 kg)   BMI 38.14 kg/m  urine dipstick 2+blood, trace leuks and 1+protein  General:  Well developed, well nourished, no acute distress Skin:  Warm and dry Neck:  Midline trachea, normal thyroid , good ROM, no lymphadenopathy,no carotid bruits heard Lungs; Clear to auscultation bilaterally Breast:  No dominant palpable mass, retraction, or nipple discharge Cardiovascular: Regular rate and rhythm Abdomen:  Soft, non tender, no hepatosplenomegaly Pelvic:  External genitalia is normal in appearance, no lesions.  The vagina is normal in appearance. Urethra has no lesions or masses. The cervix is smooth, pap with HR HPV genotyping performed.  Uterus is felt to be normal size, shape, and contour.  No adnexal masses or tenderness noted.Bladder is non tender, no masses  felt. Rectal: Deferred  Extremities/musculoskeletal:  No swelling or varicosities noted, no clubbing or cyanosis Psych:  No mood changes, alert and cooperative,seems happy AA is 0 Fall risk is low    09/23/2023   11:07 AM 08/31/2023    1:25 PM 08/18/2023    8:00 AM  Depression screen PHQ 2/9  Decreased Interest 1 0 0  Down, Depressed, Hopeless 0 0 0  PHQ - 2 Score 1 0 0  Altered sleeping 1    Tired, decreased energy 2    Change in appetite 2    Feeling bad or failure about yourself  0    Trouble concentrating 0    Moving slowly or fidgety/restless 0    Suicidal thoughts 0    PHQ-9 Score 6         09/23/2023   11:08 AM 04/07/2022   11:58 AM 02/26/2021   11:58 AM  GAD 7 : Generalized Anxiety Score  Nervous, Anxious, on Edge 1 0 0  Control/stop worrying 0 0 0  Worry too much - different things 0 0 0  Trouble relaxing 1 0 0  Restless 1 0 0  Easily annoyed or irritable 1 0 0  Afraid - awful might happen 0 0 0  Total GAD 7 Score 4 0 0      Upstream - 09/23/23 1101       Pregnancy Intention Screening   Does the patient want to become pregnant in the next year? N/A    Does the patient's partner want to become pregnant in the next  year? N/A    Would the patient like to discuss contraceptive options today? N/A      Contraception Wrap Up   Current Method Abstinence;Female Sterilization    End Method Abstinence;Female Sterilization    Contraception Counseling Provided No         Examination chaperoned by Clarita Salt LPN  Impression and Plan: 1. Encounter for gynecological examination with Papanicolaou smear of cervix (Primary) Pap sent Physical in 1 year Labs with PCP and oncology Colonoscopy per GI Last mammogram was negative 2023 - Cytology - PAP( [place])  2. Screening mammogram for breast cancer Pt to call for appt when she can   3. Hematuria, unspecified type UA C&S sent   4. Cerebrovascular accident (CVA), unspecified mechanism (HCC)  5. Non-small cell  cancer of right lung [facility]) Getting chemo and radiation

## 2023-09-23 NOTE — Telephone Encounter (Signed)
 D/C from hospital Monday 09/15.  Pt reports weakness. She is eating toast , bland food and drinking  water  , Gatorade,pedialyte and drinks one can glucerna/day.  Nausea this am .She  took Zofran  with relief. I reviewed her recent labs and told her to keep appt Monday for labs [facility] and possible infusion.   I instructed her to rest ,maintain fluid and food intake.

## 2023-09-23 NOTE — Addendum Note (Signed)
 Addended by: NEYSA HOOSE S on: 09/23/2023 11:40 AM   Modules accepted: Orders

## 2023-09-24 ENCOUNTER — Ambulatory Visit: Payer: Self-pay | Admitting: Family Medicine

## 2023-09-24 LAB — URINALYSIS, ROUTINE W REFLEX MICROSCOPIC
Bilirubin, UA: NEGATIVE
Glucose, UA: NEGATIVE
Ketones, UA: NEGATIVE
Nitrite, UA: NEGATIVE
Specific Gravity, UA: 1.019 (ref 1.005–1.030)
Urobilinogen, Ur: 1 mg/dL (ref 0.2–1.0)
pH, UA: 6 (ref 5.0–7.5)

## 2023-09-24 LAB — MICROSCOPIC EXAMINATION
Bacteria, UA: NONE SEEN
Casts: NONE SEEN /LPF
Epithelial Cells (non renal): >10 /HPF — AB (ref 0–10)

## 2023-09-25 ENCOUNTER — Ambulatory Visit: Payer: Self-pay | Admitting: Adult Health

## 2023-09-25 ENCOUNTER — Ambulatory Visit: Admitting: Family Medicine

## 2023-09-25 LAB — URINE CULTURE: Organism ID, Bacteria: NO GROWTH

## 2023-09-28 ENCOUNTER — Encounter: Payer: Self-pay | Admitting: Internal Medicine

## 2023-09-28 ENCOUNTER — Inpatient Hospital Stay

## 2023-09-28 ENCOUNTER — Encounter: Payer: Self-pay | Admitting: Nurse Practitioner

## 2023-09-28 ENCOUNTER — Inpatient Hospital Stay (HOSPITAL_BASED_OUTPATIENT_CLINIC_OR_DEPARTMENT_OTHER): Admitting: Internal Medicine

## 2023-09-28 ENCOUNTER — Ambulatory Visit: Admitting: Nurse Practitioner

## 2023-09-28 VITALS — BP 106/73 | HR 111 | Temp 97.1°F | Ht 67.0 in | Wt 241.0 lb

## 2023-09-28 VITALS — BP 128/80 | HR 97 | Temp 97.2°F | Resp 17 | Ht 67.0 in | Wt 241.6 lb

## 2023-09-28 DIAGNOSIS — C3491 Malignant neoplasm of unspecified part of right bronchus or lung: Secondary | ICD-10-CM

## 2023-09-28 DIAGNOSIS — R6520 Severe sepsis without septic shock: Secondary | ICD-10-CM

## 2023-09-28 DIAGNOSIS — E785 Hyperlipidemia, unspecified: Secondary | ICD-10-CM

## 2023-09-28 DIAGNOSIS — C3411 Malignant neoplasm of upper lobe, right bronchus or lung: Secondary | ICD-10-CM | POA: Diagnosis not present

## 2023-09-28 DIAGNOSIS — E66812 Obesity, class 2: Secondary | ICD-10-CM

## 2023-09-28 DIAGNOSIS — I10 Essential (primary) hypertension: Secondary | ICD-10-CM | POA: Diagnosis not present

## 2023-09-28 DIAGNOSIS — E1169 Type 2 diabetes mellitus with other specified complication: Secondary | ICD-10-CM

## 2023-09-28 DIAGNOSIS — Z09 Encounter for follow-up examination after completed treatment for conditions other than malignant neoplasm: Secondary | ICD-10-CM

## 2023-09-28 DIAGNOSIS — I482 Chronic atrial fibrillation, unspecified: Secondary | ICD-10-CM

## 2023-09-28 DIAGNOSIS — E782 Mixed hyperlipidemia: Secondary | ICD-10-CM

## 2023-09-28 DIAGNOSIS — A419 Sepsis, unspecified organism: Secondary | ICD-10-CM | POA: Diagnosis not present

## 2023-09-28 DIAGNOSIS — Z7984 Long term (current) use of oral hypoglycemic drugs: Secondary | ICD-10-CM

## 2023-09-28 DIAGNOSIS — E039 Hypothyroidism, unspecified: Secondary | ICD-10-CM

## 2023-09-28 LAB — CMP (CANCER CENTER ONLY)
ALT: 21 U/L (ref 0–44)
AST: 22 U/L (ref 15–41)
Albumin: 4 g/dL (ref 3.5–5.0)
Alkaline Phosphatase: 56 U/L (ref 38–126)
Anion gap: 9 (ref 5–15)
BUN: 15 mg/dL (ref 8–23)
CO2: 23 mmol/L (ref 22–32)
Calcium: 10 mg/dL (ref 8.9–10.3)
Chloride: 102 mmol/L (ref 98–111)
Creatinine: 1.4 mg/dL — ABNORMAL HIGH (ref 0.44–1.00)
GFR, Estimated: 43 mL/min — ABNORMAL LOW (ref >60)
Glucose, Bld: 84 mg/dL (ref 70–99)
Potassium: 3.9 mmol/L (ref 3.5–5.1)
Sodium: 134 mmol/L — ABNORMAL LOW (ref 135–145)
Total Bilirubin: 0.5 mg/dL (ref 0.0–1.2)
Total Protein: 7.4 g/dL (ref 6.5–8.1)

## 2023-09-28 LAB — CBC WITH DIFFERENTIAL (CANCER CENTER ONLY)
Abs Immature Granulocytes: 0.08 K/uL — ABNORMAL HIGH (ref 0.00–0.07)
Basophils Absolute: 0 K/uL (ref 0.0–0.1)
Basophils Relative: 0 %
Eosinophils Absolute: 0 K/uL (ref 0.0–0.5)
Eosinophils Relative: 0 %
HCT: 32.4 % — ABNORMAL LOW (ref 36.0–46.0)
Hemoglobin: 11.1 g/dL — ABNORMAL LOW (ref 12.0–15.0)
Immature Granulocytes: 1 %
Lymphocytes Relative: 23 %
Lymphs Abs: 1.5 K/uL (ref 0.7–4.0)
MCH: 31.2 pg (ref 26.0–34.0)
MCHC: 34.3 g/dL (ref 30.0–36.0)
MCV: 91 fL (ref 80.0–100.0)
Monocytes Absolute: 0.8 K/uL (ref 0.1–1.0)
Monocytes Relative: 12 %
Neutro Abs: 4.2 K/uL (ref 1.7–7.7)
Neutrophils Relative %: 64 %
Platelet Count: 259 K/uL (ref 150–400)
RBC: 3.56 MIL/uL — ABNORMAL LOW (ref 3.87–5.11)
RDW: 15.8 % — ABNORMAL HIGH (ref 11.5–15.5)
WBC Count: 6.7 K/uL (ref 4.0–10.5)
nRBC: 0 % (ref 0.0–0.2)

## 2023-09-28 LAB — CYTOLOGY - PAP
Adequacy: ABSENT
Comment: NEGATIVE
Diagnosis: NEGATIVE
High risk HPV: NEGATIVE

## 2023-09-28 MED ORDER — SYNTHROID 25 MCG PO TABS: 25.0000 ug | ORAL_TABLET | Freq: Every day | ORAL | 1 refills | Status: AC

## 2023-09-28 MED ORDER — SODIUM CHLORIDE 0.9% FLUSH
10.0000 mL | Freq: Once | INTRAVENOUS | Status: AC
  Administered 2023-09-28: 10 mL

## 2023-09-28 MED ORDER — CYANOCOBALAMIN 1000 MCG/ML IJ SOLN
1000.0000 ug | Freq: Once | INTRAMUSCULAR | Status: AC
  Administered 2023-09-28: 1000 ug via INTRAMUSCULAR
  Filled 2023-09-28: qty 1

## 2023-09-28 MED ORDER — SODIUM CHLORIDE 0.9 % IV SOLN: INTRAVENOUS | Status: DC

## 2023-09-28 MED ORDER — SODIUM CHLORIDE 0.9 % IV SOLN
400.0000 mg/m2 | Freq: Once | INTRAVENOUS | Status: DC
  Filled 2023-09-28: qty 36

## 2023-09-28 MED ORDER — METOPROLOL TARTRATE 25 MG PO TABS: 12.5000 mg | ORAL_TABLET | Freq: Two times a day (BID) | ORAL | 1 refills | Status: DC

## 2023-09-28 MED ORDER — KLOR-CON M20 20 MEQ PO TBCR: 20.0000 meq | EXTENDED_RELEASE_TABLET | Freq: Every day | ORAL | Status: DC | PRN

## 2023-09-28 MED ORDER — APREPITANT 130 MG/18ML IV EMUL
130.0000 mg | Freq: Once | INTRAVENOUS | Status: DC
  Filled 2023-09-28: qty 18

## 2023-09-28 MED ORDER — SODIUM CHLORIDE FLUSH 0.9 % IV SOLN: 10.0000 mL | INTRAVENOUS | Status: DC

## 2023-09-28 MED ORDER — SODIUM CHLORIDE 0.9 % IV SOLN
390.0000 mg | Freq: Once | INTRAVENOUS | Status: DC
  Filled 2023-09-28: qty 39

## 2023-09-28 MED ORDER — PALONOSETRON HCL INJECTION 0.25 MG/5ML
0.2500 mg | Freq: Once | INTRAVENOUS | Status: DC
  Filled 2023-09-28: qty 5

## 2023-09-28 MED ORDER — SODIUM CHLORIDE 0.9 % IV SOLN
200.0000 mg | Freq: Once | INTRAVENOUS | Status: DC
  Filled 2023-09-28: qty 8

## 2023-09-28 MED ORDER — OLMESARTAN MEDOXOMIL 20 MG PO TABS
20.0000 mg | ORAL_TABLET | Freq: Every day | ORAL | Status: DC
Start: 2023-09-28 — End: 2023-10-14

## 2023-09-28 MED ORDER — ROSUVASTATIN CALCIUM 20 MG PO TABS: 20.0000 mg | ORAL_TABLET | Freq: Every day | ORAL | 3 refills | Status: AC

## 2023-09-28 MED ORDER — SODIUM CHLORIDE 0.9 % IV SOLN: 622.5000 mg | Freq: Once | INTRAVENOUS | Status: DC

## 2023-09-28 MED ORDER — DEXAMETHASONE SODIUM PHOSPHATE 10 MG/ML IJ SOLN
10.0000 mg | Freq: Once | INTRAMUSCULAR | Status: DC
  Filled 2023-09-28: qty 1

## 2023-09-28 MED ORDER — GLIMEPIRIDE 2 MG PO TABS
2.0000 mg | ORAL_TABLET | Freq: Every day | ORAL | 1 refills | Status: DC
Start: 2023-09-28 — End: 2023-11-22
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
## 2023-09-28 NOTE — Progress Notes (Signed)
 Reduce Alimta  dose to 400mg /m2 and carbo AUC to 4 for today and all subsequent cycles per Dr. Sherrod.  Lurlean Kernen, PharmD, MBA

## 2023-09-28 NOTE — Progress Notes (Addendum)
 Subjective:    Patient ID: Becky [name], female    DOB: 1960/11/08, 63 y.o.   MRN: [id number]   Chief Complaint: Hospitalization Follow-up   HPI Patient went to the ED on 09/18/23 and was dx with sepsis. She has been getting on going treatment for cancer. She is transferring care to this office. 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 - Afib- is on xeralto and is doing well. No bleeding -hypertension- no chest pain or SOB BP Readings from Last 3 Encounters:  09/28/23 106/73  09/23/23 96/67  09/22/23 100/66   -hyperlipidemia- does not really watch diet very closely Lab Results  Component Value Date   CHOL 161 07/13/2023   HDL 49 07/13/2023   LDLCALC 94 07/13/2023   TRIG 89 07/13/2023   CHOLHDL 3.3 07/13/2023    - diabetes- has been on steroids and her sugars have been up Lab Results  Component Value Date   HGBA1C 6.0 (H) 07/24/2023   - hypothyroidism- no c/o fatigue Lab Results  Component Value Date   TSH 1.828 09/19/2023   She could not get in to see her PCP until January so they made hr an appointment here. Patient Active Problem List   Diagnosis Date Noted   Screening mammogram for breast cancer 09/23/2023   Bronchitis 09/21/2023   Fever 09/19/2023   Diverticulitis of colon with perforation 09/07/2023   Leukopenia 09/07/2023   Hyponatremia 09/07/2023   Hypoalbuminemia 09/07/2023   Thrombocytopenia (HCC) 09/07/2023   Idiopathic thrombocytopenic purpura (ITP) (HCC) 08/26/2023   Transient neurologic deficit 07/24/2023   Atrial fibrillation with RVR (HCC) 07/24/2023   Elevated troponin 07/24/2023   History of recent stroke 07/24/2023   Port-A-Cath in place 07/20/2023   Goals of care, counseling/discussion 07/20/2023   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) 07/16/2023   Anxiety 07/15/2023   Acute CVA (cerebrovascular accident) (HCC) 07/12/2023   Obesity, class  2 07/12/2023   Folate deficiency 06/18/2023   Hemoptysis 06/04/2023   Non-small cell carcinoma of lung (HCC) 06/04/2023   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) 06/04/2023   Metastasis to bone (HCC) 06/04/2023   Lung mass 05/27/2023   Hematochezia 04/09/2023   Stroke-like symptoms 04/08/2023   Stroke (HCC) 04/07/2023   Weakness of right quadriceps muscle 06/05/2022   ASCUS of cervix with negative high risk HPV 04/14/2022   Encounter for screening fecal occult blood testing 04/07/2022   LGSIL on Pap smear of cervix 04/07/2022   Encounter for gynecological examination with Papanicolaou smear of cervix 04/07/2022   S/P total knee arthroplasty, right 01/29/2022   Pain from implanted hardware 11/08/2021   Painful orthopaedic hardware (HCC) 09/19/2021   Prolapsed internal hemorrhoids, grade 3 05/02/2021   LGSIL of cervix of undetermined significance 03/04/2021   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   Vulvar itching 12/07/2020   Prolapsed internal hemorrhoids, grade 2 10/11/2020   Trochanteric bursitis, right hip 10/04/2020   Trochanteric bursitis, left hip 08/02/2020   Unilateral primary osteoarthritis, left knee 08/02/2020   Hemorrhoids 08/01/2020   GI bleeding 07/18/2020   Normocytic anemia    Shortness of breath    Rectal bleeding 09/17/2018   Vaginal odor 09/24/2017   Abdominal pain 08/18/2017   Nausea without vomiting 11/17/2016   Current use of long term anticoagulation 11/12/2016   Atrial fibrillation, chronic (HCC) 11/07/2016   Coronary artery disease due to lipid rich plaque    RLQ abdominal pain 10/03/2016  Yeast infection 03/13/2016   Fibroids 03/13/2016   Screening for colorectal cancer 11/28/2015   Burning with urination 11/28/2015   Subacute frontal sinusitis 11/28/2015   Leg cramps 10/31/2015   Chest pain 10/31/2015   Varicose 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 07/23/2015   Body aches 11/21/2014   Constipation 11/21/2014   Vaginal itching  03/24/2014   Vaginal discharge 03/24/2014   Pelvic pain 11/02/2013   Elevated cholesterol 11/02/2013   Low back pain 10/20/2013   Stiffness of ankle joint 10/20/2013   Pain in joint, ankle and foot 10/20/2013   Vaginal irritation 05/23/2013   Hematuria 05/23/2013   BV (bacterial vaginosis) 05/23/2013   HEMATOCHEZIA 12/05/2009   Dysphagia 12/05/2009   CHEST WALL PAIN, ANTERIOR 06/23/2007   LEG PAIN 05/18/2007   VAGINAL PRURITUS 04/16/2007   GOITER 03/10/2007   Hypothyroidism 03/10/2007   Type 2 diabetes mellitus with hyperlipidemia (HCC) 03/10/2007   Hyperlipidemia 03/10/2007   ANEMIA-IRON DEFICIENCY 03/10/2007   Essential hypertension 03/10/2007   RHINITIS, CHRONIC 03/10/2007   ASTHMA, CHILDHOOD 03/10/2007   Gastroesophageal reflux disease 03/10/2007   PEPTIC ULCER DISEASE 03/10/2007   MENOPAUSAL SYNDROME 03/10/2007       Review of Systems  Constitutional:  Negative for diaphoresis.  Eyes:  Negative for pain.  Respiratory:  Negative for shortness of breath.   Cardiovascular:  Negative for chest pain, palpitations and leg swelling.  Gastrointestinal:  Negative for abdominal pain.  Endocrine: Negative for polydipsia.  Skin:  Negative for rash.  Neurological:  Negative for dizziness, weakness and headaches.  Hematological:  Does not bruise/bleed easily.  All other systems reviewed and are negative.      Objective:   Physical Exam Vitals and nursing note reviewed.  Constitutional:      General: She is not in acute distress.    Appearance: Normal appearance. She is well-developed.  Neck:     Vascular: No carotid bruit or JVD.  Cardiovascular:     Rate and Rhythm: Rhythm irregular.     Heart sounds: Normal heart sounds.  Pulmonary:     Effort: Pulmonary effort is normal. No respiratory distress.     Breath sounds: Normal breath sounds. No wheezing or rales.  Chest:     Chest wall: No tenderness.  Abdominal:     General: Bowel sounds are normal. There is no  distension or abdominal bruit.     Palpations: Abdomen is soft. There is no hepatomegaly, splenomegaly, mass or pulsatile mass.     Tenderness: There is no abdominal tenderness.  Musculoskeletal:        General: Normal range of motion.     Cervical back: Normal range of motion and neck supple.  Lymphadenopathy:     Cervical: No cervical adenopathy.  Skin:    General: Skin is warm and dry.  Neurological:     Mental Status: She is alert and oriented to person, place, and time.     Deep Tendon Reflexes: Reflexes are normal and symmetric.  Psychiatric:        Behavior: Behavior normal.        Thought Content: Thought content normal.        Judgment: Judgment normal.    BP 106/73   Pulse (!) 111   Temp (!) 97.1 F (36.2 C) (Temporal)   Ht 5' 7 (1.702 m)   Wt 241 lb (109.3 kg)   SpO2 93%   BMI 37.75 kg/m         Assessment & Plan:  Becky [name] comes in today with chief complaint of Hospitalization Follow-up   Diagnosis and orders addressed:  1. Sepsis with acute organ dysfunction, due to unspecified organism, unspecified organ dysfunction type, unspecified whether septic shock present (HCC) (Primary) Finish antibiotics  2. Hospital discharge follow-up Hospital records reviewed  3. Atrial fibrillation, chronic (HCC) Continue eliquis  4. Essential hypertension Low sodium diet - metoprolol  tartrate (LOPRESSOR ) 25 MG tablet; Take 0.5 tablets (12.5 mg total) by mouth 2 (two) times daily.  Dispense: 180 tablet; Refill: 1 - olmesartan  (BENICAR ) 20 MG tablet; Take 1 tablet (20 mg total) by mouth daily.  5. Non-small cell cancer of right lung (HCC) Continue cancer treatments  6. Acquired hypothyroidism - SYNTHROID  25 MCG tablet; Take 1 tablet (25 mcg total) by mouth daily before breakfast.  Dispense: 90 tablet; Refill: 1  7. Mixed hyperlipidemia Low fat diet - rosuvastatin  (CRESTOR ) 20 MG tablet; Take 1 tablet (20 mg total) by mouth daily.  Dispense: 90 tablet;  Refill: 3  8. Obesity, class 2 Discussed diet and exercise for person with BMI >25 Will recheck weight in 3-6 months   9. Type 2 diabetes mellitus with hyperlipidemia (HCC) Watch carbs in diet - glimepiride  (AMARYL ) 2 MG tablet; Take 1 tablet (2 mg total) by mouth daily with breakfast.  Dispense: 90 tablet; Refill: 1   Labs pending Health Maintenance reviewed Diet and exercise encouraged  Follow up plan: 2 month   Mary-Margaret Gladis, FNP

## 2023-09-28 NOTE — Patient Instructions (Signed)
 [facility] - A DEPT OF [facility]  Discharge Instructions: Thank you for choosing [facility] to provide your oncology and hematology care.   If you have a lab appointment with the Cancer Center, please go directly to the Cancer Center and check in at the registration area.   Wear comfortable clothing and clothing appropriate for easy access to any Portacath or PICC line.   We strive to give you quality time with your provider. You may need to reschedule your appointment if you arrive late (15 or more minutes).  Arriving late affects you and other patients whose appointments are after yours.  Also, if you miss three or more appointments without notifying the office, you may be dismissed from the clinic at the provider's discretion.      For prescription refill requests, have your pharmacy contact our office and allow 72 hours for refills to be completed.    Today you received the following chemotherapy and/or immunotherapy agents: Keytruda, Alimta, Carboplatin      To help prevent nausea and vomiting after your treatment, we encourage you to take your nausea medication as directed.  BELOW ARE SYMPTOMS THAT SHOULD BE REPORTED IMMEDIATELY: *FEVER GREATER THAN 100.4 F (38 C) OR HIGHER *CHILLS OR SWEATING *NAUSEA AND VOMITING THAT IS NOT CONTROLLED WITH YOUR NAUSEA MEDICATION *UNUSUAL SHORTNESS OF BREATH *UNUSUAL BRUISING OR BLEEDING *URINARY PROBLEMS (pain or burning when urinating, or frequent urination) *BOWEL PROBLEMS (unusual diarrhea, constipation, pain near the anus) TENDERNESS IN MOUTH AND THROAT WITH OR WITHOUT PRESENCE OF ULCERS (sore throat, sores in mouth, or a toothache) UNUSUAL RASH, SWELLING OR PAIN  UNUSUAL VAGINAL DISCHARGE OR ITCHING   Items with * indicate a potential emergency and should be followed up as soon as possible or go to the Emergency Department if any problems should occur.  Please show the CHEMOTHERAPY ALERT  CARD or IMMUNOTHERAPY ALERT CARD at check-in to the Emergency Department and triage nurse.  Should you have questions after your visit or need to cancel or reschedule your appointment, please contact [facility] - A DEPT OF [facility]  Dept: [phone]  and follow the prompts.  Office hours are 8:00 a.m. to 4:30 p.m. Monday - Friday. Please note that voicemails left after 4:00 p.m. may not be returned until the following business day.  We are closed weekends and major holidays. You have access to a nurse at all times for urgent questions. Please call the main number to the clinic Dept: [phone] and follow the prompts.   For any non-urgent questions, you may also contact your provider using MyChart. We now offer e-Visits for anyone 35 and older to request care online for non-urgent symptoms. For details visit [email or web address].   Also download the MyChart app! Go to the app store, search "MyChart", open the app, select [place], and log in with your MyChart username and password.
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
## 2023-09-29 ENCOUNTER — Ambulatory Visit (HOSPITAL_COMMUNITY): Attending: Internal Medicine

## 2023-09-29 ENCOUNTER — Other Ambulatory Visit: Payer: Self-pay

## 2023-09-29 ENCOUNTER — Ambulatory Visit: Payer: Self-pay | Admitting: Nurse Practitioner

## 2023-09-29 ENCOUNTER — Encounter (HOSPITAL_COMMUNITY): Payer: Self-pay

## 2023-09-29 ENCOUNTER — Encounter: Payer: Self-pay | Admitting: Family Medicine

## 2023-09-29 ENCOUNTER — Encounter: Payer: Self-pay | Admitting: Internal Medicine

## 2023-09-29 ENCOUNTER — Inpatient Hospital Stay

## 2023-09-29 DIAGNOSIS — M6281 Muscle weakness (generalized): Secondary | ICD-10-CM

## 2023-09-29 DIAGNOSIS — Z7409 Other reduced mobility: Secondary | ICD-10-CM

## 2023-09-29 DIAGNOSIS — N1831 Chronic kidney disease, stage 3a: Secondary | ICD-10-CM | POA: Diagnosis present

## 2023-09-29 DIAGNOSIS — Z87898 Personal history of other specified conditions: Secondary | ICD-10-CM | POA: Diagnosis present

## 2023-09-29 DIAGNOSIS — I38 Endocarditis, valve unspecified: Secondary | ICD-10-CM | POA: Diagnosis present

## 2023-09-29 DIAGNOSIS — E785 Hyperlipidemia, unspecified: Secondary | ICD-10-CM | POA: Diagnosis present

## 2023-09-29 DIAGNOSIS — R29898 Other symptoms and signs involving the musculoskeletal system: Secondary | ICD-10-CM | POA: Diagnosis present

## 2023-09-29 DIAGNOSIS — I10 Essential (primary) hypertension: Secondary | ICD-10-CM | POA: Diagnosis present

## 2023-09-29 DIAGNOSIS — I480 Paroxysmal atrial fibrillation: Secondary | ICD-10-CM | POA: Diagnosis present

## 2023-09-29 LAB — CMP14+EGFR
ALT: 25 IU/L (ref 0–32)
AST: 27 IU/L (ref 0–40)
Albumin: 4.1 g/dL (ref 3.9–4.9)
Alkaline Phosphatase: 72 IU/L (ref 49–135)
BUN/Creatinine Ratio: 8 — AB (ref 12–28)
BUN: 12 mg/dL (ref 8–27)
Bilirubin Total: 0.4 mg/dL (ref 0.0–1.2)
CO2: 20 mmol/L (ref 20–29)
Calcium: 10.1 mg/dL (ref 8.7–10.3)
Chloride: 101 mmol/L (ref 96–106)
Creatinine, Ser: 1.48 mg/dL — AB (ref 0.57–1.00)
Globulin, Total: 2.8 g/dL (ref 1.5–4.5)
Glucose: 107 mg/dL — AB (ref 70–99)
Potassium: 3.9 mmol/L (ref 3.5–5.2)
Sodium: 135 mmol/L (ref 134–144)
Total Protein: 6.9 g/dL (ref 6.0–8.5)
eGFR: 40 mL/min/1.73 — AB (ref >59)

## 2023-09-29 LAB — CBC WITH DIFFERENTIAL/PLATELET
Basophils Absolute: 0 x10E3/uL (ref 0.0–0.2)
Basos: 0 %
EOS (ABSOLUTE): 0 x10E3/uL (ref 0.0–0.4)
Eos: 0 %
Hematocrit: 35.2 % (ref 34.0–46.6)
Hemoglobin: 11.5 g/dL (ref 11.1–15.9)
Immature Grans (Abs): 0.1 x10E3/uL (ref 0.0–0.1)
Immature Granulocytes: 1 %
Lymphocytes Absolute: 1.7 x10E3/uL (ref 0.7–3.1)
Lymphs: 23 %
MCH: 31.3 pg (ref 26.6–33.0)
MCHC: 32.7 g/dL (ref 31.5–35.7)
MCV: 96 fL (ref 79–97)
Monocytes Absolute: 0.8 x10E3/uL (ref 0.1–0.9)
Monocytes: 11 %
Neutrophils Absolute: 4.8 x10E3/uL (ref 1.4–7.0)
Neutrophils: 65 %
Platelets: 276 x10E3/uL (ref 150–450)
RBC: 3.68 x10E6/uL — ABNORMAL LOW (ref 3.77–5.28)
RDW: 16 % — ABNORMAL HIGH (ref 11.7–15.4)
WBC: 7.4 x10E3/uL (ref 3.4–10.8)

## 2023-09-30 ENCOUNTER — Other Ambulatory Visit

## 2023-09-30 ENCOUNTER — Ambulatory Visit

## 2023-09-30 ENCOUNTER — Ambulatory Visit: Admitting: Internal Medicine

## 2023-10-02 ENCOUNTER — Ambulatory Visit (HOSPITAL_COMMUNITY)

## 2023-10-02 ENCOUNTER — Encounter (HOSPITAL_COMMUNITY): Payer: Self-pay

## 2023-10-02 DIAGNOSIS — R29898 Other symptoms and signs involving the musculoskeletal system: Secondary | ICD-10-CM

## 2023-10-02 DIAGNOSIS — I480 Paroxysmal atrial fibrillation: Secondary | ICD-10-CM | POA: Diagnosis not present

## 2023-10-02 DIAGNOSIS — Z7409 Other reduced mobility: Secondary | ICD-10-CM

## 2023-10-02 DIAGNOSIS — M6281 Muscle weakness (generalized): Secondary | ICD-10-CM
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
## 2023-10-07 ENCOUNTER — Ambulatory Visit (HOSPITAL_COMMUNITY): Attending: Internal Medicine

## 2023-10-07 DIAGNOSIS — R29898 Other symptoms and signs involving the musculoskeletal system: Secondary | ICD-10-CM | POA: Diagnosis present

## 2023-10-07 DIAGNOSIS — M6281 Muscle weakness (generalized): Secondary | ICD-10-CM | POA: Insufficient documentation

## 2023-10-07 DIAGNOSIS — Z7409 Other reduced mobility: Secondary | ICD-10-CM | POA: Diagnosis present
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
## 2023-10-12 ENCOUNTER — Ambulatory Visit

## 2023-10-12 ENCOUNTER — Encounter

## 2023-10-12 ENCOUNTER — Ambulatory Visit: Admitting: Physician Assistant

## 2023-10-12 ENCOUNTER — Other Ambulatory Visit

## 2023-10-14 ENCOUNTER — Encounter (HOSPITAL_COMMUNITY): Payer: Self-pay

## 2023-10-14 ENCOUNTER — Other Ambulatory Visit: Payer: Self-pay

## 2023-10-14 ENCOUNTER — Emergency Department (HOSPITAL_COMMUNITY)

## 2023-10-14 ENCOUNTER — Encounter (HOSPITAL_COMMUNITY)

## 2023-10-14 ENCOUNTER — Emergency Department (HOSPITAL_COMMUNITY)
Admission: EM | Admit: 2023-10-14 | Discharge: 2023-10-14 | Disposition: A | Discharge status: Home / Self Care | Attending: Emergency Medicine | Admitting: Emergency Medicine

## 2023-10-14 DIAGNOSIS — R110 Nausea: Secondary | ICD-10-CM | POA: Insufficient documentation

## 2023-10-14 DIAGNOSIS — C3490 Malignant neoplasm of unspecified part of unspecified bronchus or lung: Secondary | ICD-10-CM | POA: Insufficient documentation

## 2023-10-14 DIAGNOSIS — Z7982 Long term (current) use of aspirin: Secondary | ICD-10-CM | POA: Diagnosis not present

## 2023-10-14 DIAGNOSIS — Z79899 Other long term (current) drug therapy: Secondary | ICD-10-CM | POA: Insufficient documentation

## 2023-10-14 DIAGNOSIS — Z7901 Long term (current) use of anticoagulants: Secondary | ICD-10-CM | POA: Insufficient documentation

## 2023-10-14 DIAGNOSIS — E119 Type 2 diabetes mellitus without complications: Secondary | ICD-10-CM | POA: Diagnosis not present

## 2023-10-14 DIAGNOSIS — Z91040 Latex allergy status: Secondary | ICD-10-CM | POA: Insufficient documentation

## 2023-10-14 DIAGNOSIS — Z7984 Long term (current) use of oral hypoglycemic drugs: Secondary | ICD-10-CM | POA: Insufficient documentation

## 2023-10-14 DIAGNOSIS — R002 Palpitations: Secondary | ICD-10-CM | POA: Insufficient documentation

## 2023-10-14 DIAGNOSIS — I10 Essential (primary) hypertension: Secondary | ICD-10-CM | POA: Insufficient documentation

## 2023-10-14 DIAGNOSIS — R531 Weakness: Secondary | ICD-10-CM

## 2023-10-14 LAB — CBC WITH DIFFERENTIAL/PLATELET
Abs Immature Granulocytes: 0.03 K/uL (ref 0.00–0.07)
Basophils Absolute: 0 K/uL (ref 0.0–0.1)
Basophils Relative: 0 %
Eosinophils Absolute: 0.1 K/uL (ref 0.0–0.5)
Eosinophils Relative: 2 %
HCT: 35.3 % — ABNORMAL LOW (ref 36.0–46.0)
Hemoglobin: 11.7 g/dL — ABNORMAL LOW (ref 12.0–15.0)
Immature Granulocytes: 0 %
Lymphocytes Relative: 22 %
Lymphs Abs: 1.5 K/uL (ref 0.7–4.0)
MCH: 31.5 pg (ref 26.0–34.0)
MCHC: 33.1 g/dL (ref 30.0–36.0)
MCV: 94.9 fL (ref 80.0–100.0)
Monocytes Absolute: 1 K/uL (ref 0.1–1.0)
Monocytes Relative: 14 %
Neutro Abs: 4.1 K/uL (ref 1.7–7.7)
Neutrophils Relative %: 62 %
Platelets: 229 K/uL (ref 150–400)
RBC: 3.72 MIL/uL — ABNORMAL LOW (ref 3.87–5.11)
RDW: 14.5 % (ref 11.5–15.5)
WBC: 6.8 K/uL (ref 4.0–10.5)
nRBC: 0 % (ref 0.0–0.2)

## 2023-10-14 LAB — URINALYSIS, W/ REFLEX TO CULTURE (INFECTION SUSPECTED)
Bilirubin Urine: NEGATIVE
Glucose, UA: NEGATIVE mg/dL
Hgb urine dipstick: NEGATIVE
Ketones, ur: NEGATIVE mg/dL
Leukocytes,Ua: NEGATIVE
Nitrite: NEGATIVE
Protein, ur: NEGATIVE mg/dL
Specific Gravity, Urine: 1.011 (ref 1.005–1.030)
pH: 5 (ref 5.0–8.0)

## 2023-10-14 LAB — COMPREHENSIVE METABOLIC PANEL WITH GFR
ALT: 23 U/L (ref 0–44)
AST: 31 U/L (ref 15–41)
Albumin: 4.1 g/dL (ref 3.5–5.0)
Alkaline Phosphatase: 70 U/L (ref 38–126)
Anion gap: 15 (ref 5–15)
BUN: 8 mg/dL (ref 8–23)
CO2: 23 mmol/L (ref 22–32)
Calcium: 10.5 mg/dL — ABNORMAL HIGH (ref 8.9–10.3)
Chloride: 103 mmol/L (ref 98–111)
Creatinine, Ser: 1.26 mg/dL — ABNORMAL HIGH (ref 0.44–1.00)
GFR, Estimated: 48 mL/min — ABNORMAL LOW (ref >60)
Glucose, Bld: 101 mg/dL — ABNORMAL HIGH (ref 70–99)
Potassium: 3.7 mmol/L (ref 3.5–5.1)
Sodium: 141 mmol/L (ref 135–145)
Total Bilirubin: 0.5 mg/dL (ref 0.0–1.2)
Total Protein: 7.1 g/dL (ref 6.5–8.1)

## 2023-10-14 LAB — PROTIME-INR
INR: 2.2 — ABNORMAL HIGH (ref 0.8–1.2)
Prothrombin Time: 25.8 s — ABNORMAL HIGH (ref 11.4–15.2)

## 2023-10-14 LAB — LACTIC ACID, PLASMA: Lactic Acid, Venous: 1.3 mmol/L (ref 0.5–1.9)

## 2023-10-14 LAB — RESP PANEL BY RT-PCR (RSV, FLU A&B, COVID)  RVPGX2
Influenza A by PCR: NEGATIVE
Influenza B by PCR: NEGATIVE
Resp Syncytial Virus by PCR: NEGATIVE
SARS Coronavirus 2 by RT PCR: NEGATIVE

## 2023-10-14 MED ORDER — HEPARIN SOD (PORK) LOCK FLUSH 100 UNIT/ML IV SOLN
INTRAVENOUS | Status: AC
  Administered 2023-10-14: 500 [IU]
  Filled 2023-10-14: qty 5

## 2023-10-14 MED ORDER — ONDANSETRON HCL 4 MG/2ML IJ SOLN
4.0000 mg | Freq: Once | INTRAMUSCULAR | Status: AC
  Administered 2023-10-14: 4 mg via INTRAVENOUS
  Filled 2023-10-14: qty 2

## 2023-10-14 NOTE — Discharge Instructions (Addendum)
 Please follow up with your oncology office and your primary care office this week.

## 2023-10-14 NOTE — ED Triage Notes (Signed)
 Pt to ED from home with c/o palpitations with hx of A-fib, denies chest pain, just intermittent headache pain, pt reports taking meds as prescribed and has taken evening dose.

## 2023-10-14 NOTE — ED Notes (Signed)
Lab at bedside to draw blood and cultures

## 2023-10-14 NOTE — ED Provider Notes (Signed)
 [place] EMERGENCY DEPARTMENT AT [facility] Provider Note   CSN: [id number] Arrival date & time: 10/14/23  1911     Patient presents with: Palpitations   Becky [name] is a 63 y.o. 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  Couldn't get last chemo due to weakness.  Daughter concerned about pt's declining function  Last hospitalized 09/21/23 for diverticulitis and recurring fevers,   {Add pertinent medical, surgical, social history, OB history to HPI:32947} HPI     Prior to Admission medications   Medication Sig Start Date End Date Taking? Authorizing Provider  acetaminophen  (TYLENOL ) 500 MG tablet Take 1,000 mg by mouth every 6 (six) hours as needed for mild pain (pain score 1-3).    [provider]  albuterol  (VENTOLIN  HFA) 108 (90 Base) MCG/ACT inhaler Inhale 2 puffs into the lungs every 4 (four) hours as needed for wheezing or shortness of breath. 12/07/21   Stuart Vernell Norris, PA-C  aspirin  EC 81 MG tablet Take 1 tablet (81 mg total) by mouth daily. Swallow whole. 07/25/23   Ricky Fines, MD  Azelastine  HCl 137 MCG/SPRAY SOLN Place 1 spray into both nostrils daily. 10/02/23   [provider]  dexlansoprazole  (DEXILANT ) 60 MG capsule Take 1 capsule (60 mg total) by mouth daily. 08/12/23   Shirlean Therisa ORN, NP  diltiazem  (TIADYLT  ER) 360 MG 24 hr capsule TAKE ONE (1) CAPSULE BY MOUTH ONCE DAILY *NEW PRESCRIPTION REQUEST* 08/05/23   Alvan Dorn FALCON, MD  EPINEPHrine  0.3 mg/0.3 mL IJ SOAJ injection Inject 0.3 mg into the muscle once as needed for anaphylaxis. 04/20/18   [provider]  fluconazole  (DIFLUCAN ) 100 MG tablet Take 100 mg by mouth daily. 10/06/23   [provider]  folic acid  (FOLVITE ) 1 MG tablet Take 1 tablet (1 mg total) by mouth daily. 06/18/23    Davonna Siad, MD  glimepiride  (AMARYL ) 2 MG tablet Take 1 tablet (2 mg total) by mouth daily with breakfast. 09/28/23   Gladis, Mary-Margaret, FNP  KLOR-CON  M20 20 MEQ tablet Take 1 tablet (20 mEq total) by mouth daily as needed. Pt takes as needed when she has cramps 09/28/23   [name], FNP  lidocaine -prilocaine  (EMLA ) cream APPLY TOPICALLY TO AFFECTED AREAS ONCE DAILY 09/14/23   Sherrod Sherrod, MD  linaclotide  (LINZESS ) 72 MCG capsule Take 1 capsule (72 mcg total) by mouth daily before breakfast. 05/05/23   Shirlean Therisa ORN, NP  LORazepam  (ATIVAN ) 0.5 MG tablet Take 1 tablet (0.5 mg total) by mouth every 8 (eight) hours as needed for anxiety. Take 1 hour prior to radiation treatment 07/15/23   Shannon Agent, MD  metoprolol  tartrate (LOPRESSOR ) 25 MG tablet Take 0.5 tablets (12.5 mg total) by mouth 2 (two) times daily. 09/28/23   Gladis Mary-Margaret, FNP  montelukast  (SINGULAIR ) 10 MG tablet Take 10 mg by mouth at bedtime. 08/25/16   [provider]  neomycin-polymyxin b-dexamethasone  (MAXITROL) 3.5-10000-0.1 SUSP Place 4 drops into the right eye daily.    [provider]  NON FORMULARY Pt uses a cpap nightly    [provider]  olmesartan  (BENICAR ) 40 MG tablet Take 40 mg by mouth daily. 10/02/23   [provider]  ondansetron  (ZOFRAN ) 8 MG tablet TAKE 1 TABLET BY MOUTH EVERY 8 HOURS FOR NAUSEA & VOMITING, START ON THE THIRD DAY AFTER CHEMOTHERAPY  08/08/23   Sherrod Sherrod, MD  polyethylene glycol (MIRALAX ) 17 g packet Take 17 g by mouth daily. 04/10/23   Maree, Pratik D, DO  prochlorperazine  (COMPAZINE ) 10 MG tablet Take 1 tablet (10 mg total) by mouth every 6 (six) hours as needed. 08/04/23   Heilingoetter, Cassandra L, PA-C  RESTASIS  0.05 % ophthalmic emulsion Place 1 drop into both eyes daily as needed (dry eyes). 04/24/23   [provider]  rivaroxaban  (XARELTO ) 20 MG TABS tablet Take 1 tablet (20 mg total) by mouth daily. 08/24/23   Alvan Dorn FALCON, MD  rosuvastatin  (CRESTOR ) 20 MG tablet Take 1 tablet (20 mg total) by mouth daily. 09/28/23   Gladis, Mary-Margaret, FNP  SENEXON-S 8.6-50 MG tablet Take 1 tablet by mouth at bedtime. 08/14/23   [provider]  spironolactone  (ALDACTONE ) 25 MG tablet Take 25 mg by mouth daily. 10/02/23   [provider]  SYNTHROID  25 MCG tablet Take 1 tablet (25 mcg total) by mouth daily before breakfast. 09/28/23   [name], FNP    Allergies: Apresoline  [hydralazine ], Latex, Other, and Prednisone     Review of Systems  Updated Vital Signs BP (!) 140/75   Pulse 94   Temp 98.4 F (36.9 C) (Oral)   Resp 20   Ht 5' 7 (1.702 m)   Wt 109.6 kg   SpO2 99%   BMI 37.84 kg/m   Physical Exam  (all labs ordered are listed, but only abnormal results are displayed) Labs Reviewed - No data to display  EKG: None  Radiology: No results found.  {Document cardiac monitor, telemetry assessment procedure when appropriate:32947} Procedures   Medications Ordered in the ED - No data to display    {Click here for ABCD2, HEART and other calculators REFRESH Note before signing:1}                              Medical Decision Making  ***  {Document critical care time when appropriate  Document review of labs and clinical decision tools ie CHADS2VASC2, etc  Document your independent review of radiology images and any outside records  Document your discussion with family members, caretakers and with consultants  Document social determinants of health affecting pt's care  Document your decision making why or why not admission, treatments were needed:32947:::1}   Final diagnoses:  None    ED Discharge Orders     None

## 2023-10-15 ENCOUNTER — Telehealth: Payer: Self-pay | Admitting: *Deleted

## 2023-10-15 ENCOUNTER — Ambulatory Visit (INDEPENDENT_AMBULATORY_CARE_PROVIDER_SITE_OTHER): Admitting: Internal Medicine

## 2023-10-15 VITALS — BP 112/76 | HR 111 | Temp 98.5°F | Ht 67.0 in | Wt 233.4 lb

## 2023-10-15 DIAGNOSIS — K219 Gastro-esophageal reflux disease without esophagitis: Secondary | ICD-10-CM

## 2023-10-15 DIAGNOSIS — K5909 Other constipation: Secondary | ICD-10-CM

## 2023-10-15 DIAGNOSIS — Z8719 Personal history of other diseases of the digestive system: Secondary | ICD-10-CM

## 2023-10-15 DIAGNOSIS — K5904 Chronic idiopathic constipation: Secondary | ICD-10-CM

## 2023-10-15 DIAGNOSIS — R110 Nausea: Secondary | ICD-10-CM

## 2023-10-15 MED ORDER — DEXLANSOPRAZOLE 60 MG PO CPDR
60.0000 mg | DELAYED_RELEASE_CAPSULE | Freq: Every day | ORAL | 3 refills | Status: AC
Start: 2023-10-15 — End: 2024-10-14

## 2023-10-15 MED ORDER — LINACLOTIDE 145 MCG PO CAPS: 145.0000 ug | ORAL_CAPSULE | Freq: Every day | ORAL | Status: DC

## 2023-10-15 MED ORDER — PROCHLORPERAZINE MALEATE 10 MG PO TABS: 10.0000 mg | ORAL_TABLET | Freq: Four times a day (QID) | ORAL | 2 refills | Status: DC | PRN

## 2023-10-15 NOTE — Telephone Encounter (Signed)
 Medication Samples have been provided to the patient.  Drug name: Linzess        Strength: 145mcg        Qty: 3  LOT: [id number]  Exp.Date: 03/2024  Dosing instructions: Take one 30 mins before breakfast.   The patient has been instructed regarding the correct time, dose, and frequency of taking this medication, including desired effects and most common side effects.   Becky [name] 10:39 AM 10/15/2023
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
## 2023-10-15 NOTE — Patient Instructions (Signed)
 For your constipation, I am going give you samples of Linzess  145 mcg daily.  Let me know if you like this dosage better than the 72 and I will send in formal prescription.  Continue on Dexilant  for your chronic acid reflux.  I refilled this today.  Continue ondansetron  as needed for your nausea.  I will refill your prochlorperazine  as well.  Follow-up in GI office in 8 weeks.  It was very nice seeing both you today.  Dr. Cindie

## 2023-10-16 ENCOUNTER — Telehealth (HOSPITAL_COMMUNITY): Payer: Self-pay

## 2023-10-16 ENCOUNTER — Encounter (HOSPITAL_COMMUNITY)

## 2023-10-16 NOTE — Telephone Encounter (Signed)
 No show, called and left message concerning missed apt today. Included next apt date and time wiht contact number included if needs to cancel or reschedule apts in the future.   Augustin Mclean, LPTA/CLT; [facility] [phone]

## 2023-10-19 ENCOUNTER — Inpatient Hospital Stay: Attending: Internal Medicine

## 2023-10-19 ENCOUNTER — Inpatient Hospital Stay: Admitting: Internal Medicine

## 2023-10-19 ENCOUNTER — Inpatient Hospital Stay

## 2023-10-19 ENCOUNTER — Encounter: Payer: Self-pay | Admitting: Internal Medicine

## 2023-10-19 VITALS — BP 122/76 | HR 98 | Temp 97.3°F | Resp 17 | Ht 67.0 in | Wt 232.0 lb

## 2023-10-19 DIAGNOSIS — C7972 Secondary malignant neoplasm of left adrenal gland: Secondary | ICD-10-CM | POA: Insufficient documentation

## 2023-10-19 DIAGNOSIS — E876 Hypokalemia: Secondary | ICD-10-CM | POA: Insufficient documentation

## 2023-10-19 DIAGNOSIS — R530 Neoplastic (malignant) related fatigue: Secondary | ICD-10-CM | POA: Diagnosis not present

## 2023-10-19 DIAGNOSIS — D693 Immune thrombocytopenic purpura: Secondary | ICD-10-CM | POA: Insufficient documentation

## 2023-10-19 DIAGNOSIS — Z5112 Encounter for antineoplastic immunotherapy: Secondary | ICD-10-CM | POA: Diagnosis present

## 2023-10-19 DIAGNOSIS — Z5111 Encounter for antineoplastic chemotherapy: Secondary | ICD-10-CM | POA: Insufficient documentation

## 2023-10-19 DIAGNOSIS — E878 Other disorders of electrolyte and fluid balance, not elsewhere classified: Secondary | ICD-10-CM | POA: Diagnosis not present

## 2023-10-19 DIAGNOSIS — C3491 Malignant neoplasm of unspecified part of right bronchus or lung: Secondary | ICD-10-CM | POA: Diagnosis not present

## 2023-10-19 DIAGNOSIS — C7931 Secondary malignant neoplasm of brain: Secondary | ICD-10-CM | POA: Insufficient documentation

## 2023-10-19 DIAGNOSIS — R112 Nausea with vomiting, unspecified: Secondary | ICD-10-CM | POA: Diagnosis not present

## 2023-10-19 DIAGNOSIS — R634 Abnormal weight loss: Secondary | ICD-10-CM | POA: Insufficient documentation

## 2023-10-19 DIAGNOSIS — C7951 Secondary malignant neoplasm of bone: Secondary | ICD-10-CM | POA: Diagnosis not present

## 2023-10-19 DIAGNOSIS — H6123 Impacted cerumen, bilateral: Secondary | ICD-10-CM | POA: Diagnosis not present

## 2023-10-19 DIAGNOSIS — C3411 Malignant neoplasm of upper lobe, right bronchus or lung: Secondary | ICD-10-CM | POA: Insufficient documentation

## 2023-10-19 DIAGNOSIS — D61810 Antineoplastic chemotherapy induced pancytopenia: Secondary | ICD-10-CM | POA: Diagnosis not present

## 2023-10-19 LAB — CULTURE, BLOOD (ROUTINE X 2)
Culture: NO GROWTH
Culture: NO GROWTH
Special Requests: ADEQUATE

## 2023-10-19 LAB — CBC WITH DIFFERENTIAL (CANCER CENTER ONLY)
Abs Immature Granulocytes: 0.04 K/uL (ref 0.00–0.07)
Basophils Absolute: 0 K/uL (ref 0.0–0.1)
Basophils Relative: 1 %
Eosinophils Absolute: 0.1 K/uL (ref 0.0–0.5)
Eosinophils Relative: 2 %
HCT: 32.2 % — ABNORMAL LOW (ref 36.0–46.0)
Hemoglobin: 11 g/dL — ABNORMAL LOW (ref 12.0–15.0)
Immature Granulocytes: 1 %
Lymphocytes Relative: 17 %
Lymphs Abs: 1.1 K/uL (ref 0.7–4.0)
MCH: 31.4 pg (ref 26.0–34.0)
MCHC: 34.2 g/dL (ref 30.0–36.0)
MCV: 92 fL (ref 80.0–100.0)
Monocytes Absolute: 0.8 K/uL (ref 0.1–1.0)
Monocytes Relative: 12 %
Neutro Abs: 4.4 K/uL (ref 1.7–7.7)
Neutrophils Relative %: 67 %
Platelet Count: 231 K/uL (ref 150–400)
RBC: 3.5 MIL/uL — ABNORMAL LOW (ref 3.87–5.11)
RDW: 14.3 % (ref 11.5–15.5)
WBC Count: 6.5 K/uL (ref 4.0–10.5)
nRBC: 0 % (ref 0.0–0.2)

## 2023-10-19 LAB — CMP (CANCER CENTER ONLY)
ALT: 16 U/L (ref 0–44)
AST: 21 U/L (ref 15–41)
Albumin: 4 g/dL (ref 3.5–5.0)
Alkaline Phosphatase: 57 U/L (ref 38–126)
Anion gap: 9 (ref 5–15)
BUN: 9 mg/dL (ref 8–23)
CO2: 26 mmol/L (ref 22–32)
Calcium: 10.3 mg/dL (ref 8.9–10.3)
Chloride: 105 mmol/L (ref 98–111)
Creatinine: 1.02 mg/dL — ABNORMAL HIGH (ref 0.44–1.00)
GFR, Estimated: >60 mL/min (ref >60)
Glucose, Bld: 116 mg/dL — ABNORMAL HIGH (ref 70–99)
Potassium: 3.5 mmol/L (ref 3.5–5.1)
Sodium: 140 mmol/L (ref 135–145)
Total Bilirubin: 0.6 mg/dL (ref 0.0–1.2)
Total Protein: 6.8 g/dL (ref 6.5–8.1)

## 2023-10-19 MED ORDER — DEXAMETHASONE SOD PHOSPHATE PF 10 MG/ML IJ SOLN
10.0000 mg | Freq: Once | INTRAMUSCULAR | Status: AC
  Administered 2023-10-19: 10 mg via INTRAVENOUS

## 2023-10-19 MED ORDER — SODIUM CHLORIDE 0.9 % IV SOLN
400.0000 mg/m2 | Freq: Once | INTRAVENOUS | Status: AC
  Administered 2023-10-19: 900 mg via INTRAVENOUS
  Filled 2023-10-19: qty 20

## 2023-10-19 MED ORDER — PALONOSETRON HCL INJECTION 0.25 MG/5ML
0.2500 mg | Freq: Once | INTRAVENOUS | Status: AC
  Administered 2023-10-19: 0.25 mg via INTRAVENOUS
  Filled 2023-10-19: qty 5

## 2023-10-19 MED ORDER — SODIUM CHLORIDE 0.9 % IV SOLN
498.0000 mg | Freq: Once | INTRAVENOUS | Status: AC
  Administered 2023-10-19: 500 mg via INTRAVENOUS
  Filled 2023-10-19: qty 50

## 2023-10-19 MED ORDER — APREPITANT 130 MG/18ML IV EMUL
130.0000 mg | Freq: Once | INTRAVENOUS | Status: AC
  Administered 2023-10-19: 130 mg via INTRAVENOUS
  Filled 2023-10-19: qty 18

## 2023-10-19 MED ORDER — SODIUM CHLORIDE 0.9 % IV SOLN
200.0000 mg | Freq: Once | INTRAVENOUS | Status: AC
  Administered 2023-10-19: 200 mg via INTRAVENOUS
  Filled 2023-10-19: qty 200

## 2023-10-19 MED ORDER — SODIUM CHLORIDE 0.9% FLUSH: 10.0000 mL | INTRAVENOUS | Status: DC | PRN

## 2023-10-19 MED ORDER — SODIUM CHLORIDE 0.9 % IV SOLN: INTRAVENOUS | Status: DC
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
## 2023-10-19 NOTE — Progress Notes (Signed)
 Nutrition Follow-up:  Patient with stage IV non-small cell lung cancer. Hospitalized times 2 for nausea and fever, diverticulitis.  No chemo in 2 months (08/31/23).    Met with patient and son during infusion.  Reports poor po intake since hospitalized with diverticulitis.  Reports nausea as well.  Chemo being dose reduced today. Reports foods taste overly sweet or have no taste at all.    Medications: dexilant , zofran , compazine , amaryl , linzess   Labs: reviewed  Anthropometrics:   Weight 232 lb today  259 lb on 8/7 (BLE edema)  10% weight loss in the last 2 months   NUTRITION DIAGNOSIS: Inadequate oral intake related to nausea, taste alterations as evidenced by 10% weight loss in the last 2 months and decreased intake for > 5 days    INTERVENTION:  Discussed strategies to help with taste change.  Provided information on metaqil and where to purchase Encouraged taking antinausea medications Discussed foods to choose with nausea. Handout provided Samples of ensure clear and boost breeze provided.  Does not like milky shakes.  Discussed higher sugar content of these shakes and ways to dilute    MONITORING, EVALUATION, GOAL: weight trends, intake   NEXT VISIT: Monday, Oct 27 during infusion  Ileana Chalupa B. Dasie SOLON, CSO, LDN Registered Dietitian [phone]

## 2023-10-19 NOTE — Patient Instructions (Signed)
 [facility] - A DEPT OF [place]  Discharge Instructions: Thank you for choosing [facility] to provide your oncology and hematology care.   If you have a lab appointment with the Cancer Center, please go directly to the Cancer Center and check in at the registration area.   Wear comfortable clothing and clothing appropriate for easy access to any Portacath or PICC line.   We strive to give you quality time with your provider. You may need to reschedule your appointment if you arrive late (15 or more minutes).  Arriving late affects you and other patients whose appointments are after yours.  Also, if you miss three or more appointments without notifying the office, you may be dismissed from the clinic at the provider's discretion.      For prescription refill requests, have your pharmacy contact our office and allow 72 hours for refills to be completed.    Today you received the following chemotherapy and/or immunotherapy agents keytruda , Alimta , carboplatin       To help prevent nausea and vomiting after your treatment, we encourage you to take your nausea medication as directed.  BELOW ARE SYMPTOMS THAT SHOULD BE REPORTED IMMEDIATELY: *FEVER GREATER THAN 100.4 F (38 C) OR HIGHER *CHILLS OR SWEATING *NAUSEA AND VOMITING THAT IS NOT CONTROLLED WITH YOUR NAUSEA MEDICATION *UNUSUAL SHORTNESS OF BREATH *UNUSUAL BRUISING OR BLEEDING *URINARY PROBLEMS (pain or burning when urinating, or frequent urination) *BOWEL PROBLEMS (unusual diarrhea, constipation, pain near the anus) TENDERNESS IN MOUTH AND THROAT WITH OR WITHOUT PRESENCE OF ULCERS (sore throat, sores in mouth, or a toothache) UNUSUAL RASH, SWELLING OR PAIN  UNUSUAL VAGINAL DISCHARGE OR ITCHING   Items with * indicate a potential emergency and should be followed up as soon as possible or go to the Emergency Department if any problems should occur.  Please show the CHEMOTHERAPY ALERT  CARD or IMMUNOTHERAPY ALERT CARD at check-in to the Emergency Department and triage nurse.  Should you have questions after your visit or need to cancel or reschedule your appointment, please contact [facility] - A DEPT [facility]  Dept: [phone]  and follow the prompts.  Office hours are 8:00 a.m. to 4:30 p.m. Monday - Friday. Please note that voicemails left after 4:00 p.m. may not be returned until the following business day.  We are closed weekends and major holidays. You have access to a nurse at all times for urgent questions. Please call the main number to the clinic Dept: [phone] and follow the prompts.   For any non-urgent questions, you may also contact your provider using MyChart. We now offer e-Visits for anyone 52 and older to request care online for non-urgent symptoms. For details visit [email or web address].   Also download the MyChart app! Go to the app store, search MyChart, open the app, select [facility] [place], and log in with your MyChart username and password.

## 2023-10-21 ENCOUNTER — Encounter (INDEPENDENT_AMBULATORY_CARE_PROVIDER_SITE_OTHER): Payer: Self-pay | Admitting: Gastroenterology

## 2023-10-21 ENCOUNTER — Encounter (HOSPITAL_COMMUNITY): Payer: Self-pay

## 2023-10-21 ENCOUNTER — Ambulatory Visit (HOSPITAL_COMMUNITY)

## 2023-10-21 DIAGNOSIS — M6281 Muscle weakness (generalized): Secondary | ICD-10-CM

## 2023-10-21 DIAGNOSIS — Z7409 Other reduced mobility: Secondary | ICD-10-CM | POA: Diagnosis not present

## 2023-10-21 DIAGNOSIS — R29898 Other symptoms and signs involving the musculoskeletal system: Secondary | ICD-10-CM
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
## 2023-10-26 ENCOUNTER — Ambulatory Visit (HOSPITAL_COMMUNITY): Admitting: Physical Therapy

## 2023-10-26 DIAGNOSIS — Z7409 Other reduced mobility: Secondary | ICD-10-CM

## 2023-10-26 DIAGNOSIS — R29898 Other symptoms and signs involving the musculoskeletal system: Secondary | ICD-10-CM

## 2023-10-26 DIAGNOSIS — M6281 Muscle weakness (generalized): Secondary | ICD-10-CM
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
## 2023-10-27 ENCOUNTER — Ambulatory Visit: Attending: Nurse Practitioner | Admitting: Nurse Practitioner

## 2023-10-27 ENCOUNTER — Encounter: Payer: Self-pay | Admitting: Nurse Practitioner

## 2023-10-27 VITALS — BP 106/68 | HR 84 | Ht 67.0 in | Wt 231.0 lb

## 2023-10-27 DIAGNOSIS — E785 Hyperlipidemia, unspecified: Secondary | ICD-10-CM

## 2023-10-27 DIAGNOSIS — N1831 Chronic kidney disease, stage 3a: Secondary | ICD-10-CM

## 2023-10-27 DIAGNOSIS — I10 Essential (primary) hypertension: Secondary | ICD-10-CM | POA: Diagnosis not present

## 2023-10-27 DIAGNOSIS — I480 Paroxysmal atrial fibrillation: Secondary | ICD-10-CM

## 2023-10-27 DIAGNOSIS — Z87898 Personal history of other specified conditions: Secondary | ICD-10-CM | POA: Diagnosis not present

## 2023-10-27 DIAGNOSIS — I38 Endocarditis, valve unspecified: Secondary | ICD-10-CM

## 2023-10-27 MED ORDER — OLMESARTAN MEDOXOMIL 20 MG PO TABS: 10.0000 mg | ORAL_TABLET | Freq: Every day | ORAL | 0 refills | Status: DC

## 2023-10-27 NOTE — Patient Instructions (Addendum)
 Medication Instructions:  Your physician has recommended you make the following change in your medication:  Please STOP Spironolactone  Please HOLD Metoprolol   Please DECREASE Olmesartan  to 10 Mg daily   Labwork: None   Testing/Procedures: None   Follow-Up: Your physician recommends that you schedule a follow-up appointment in: 4-6 weeks   Any Other Special Instructions Will Be Listed Below (If Applicable).  Please Update us  with your Blood Pressure Readings VIA MyChart in 1-2 weeks   If you need a refill on your cardiac medications before your next appointment, please call your pharmacy.

## 2023-10-27 NOTE — Progress Notes (Unsigned)
 Cardiology Office Note:  .   Date: 09/22/2023 ID:  Becky [name], DOB 08/16/1960, MRN [id number] PCP: Edman Meade PEDLAR, FNP  [facility] Providers Cardiologist:  Alvan Carrier, MD Electrophysiologist:  OLE ONEIDA HOLTS, MD  PV Cardiologist:  Gordy Bergamo, MD    History of Present Illness: .   Becky [name] is a 63 y.o. female with a PMH of chest pain, PAF, HTN, HLD, shortness of breath/DOE, OSA, cough, dizziness, mild to moderate AI, CKD, and leg edema/leg pains, who presents today for 4 week follow-up.   Last seen by Dr. Carrier Alvan on March 25, 2022.  At that time, she noticed some increased symptoms of A-fib during some previous extended issues with respiratory tract infections, palpitations were resolving.  At that time, she noted some recurrent GI bleeding and was instructed to hold Xarelto  for 3 days and resume if her issues resolved.  At that time, she had a GI appointment the following week.  Recommended to that may need to reconsider Watchman, she has started evaluation.  ABIs were ordered due to her reported leg pains.  ED visit on April 05, 2023 for rectal bleeding. Fecal occult was positive.  H&H: 11.1/33.9.  Was discharged from ED with GI follow-up.  She was off her Xarelto  for 3 days as instructed.  Hospitalized early April 2025 due to complaints of numbness along right hand, numbness along left toes, also noted intermittent episodes of dizziness, headache, and blurred vision.  She also continues to note bloody stools.  No melena noted.  Xarelto  was held and was noted the last time she took medication was on March 27, 2023.  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  Noted to have nonbleeding internal hemorrhoids, some diverticulosis, recommended to have repeat colonoscopy in 5 years and remain on MiraLAX  daily.  Per GI, she was okay to  resume OAC.  ED visit April 13, 2023 for dizziness.  Stated the symptoms felt similar before previous hospitalization when diagnosed with a stroke.  Reported being compliant with Xarelto .  Noted intermittent blurry vision and dizziness.  CT of the head was negative for anything acute.  Blood work was unremarkable.  Contacted our office on April 18 noting intermittent lightheadedness, noting labile heart rates, self decreased metoprolol  tartrate to 25 mg twice daily, seemed to help her blood pressure and heart rate from dropping so much.  Denied syncope. Laymon Qua, PA-C recommended to further reduce Lopressor  to 12.5 mg BID.   05/06/2023 - Today she presents for scheduled follow-up.  She states she is doing better than previously.  Tells me she had 3 days of some bleeding noticed after her colonoscopy, then the bleeding stopped.  She denies any recent recurrent GI bleeding.  Admits to some lower SBP readings, confirms SBP ranging from 95- 118.  Notices heart rate dropping at nighttime, 40's at times even after lowering Lopressor  to 12.5 mg BID, HR on average is between 53-63 bpm.  She is compliant with her CPAP usage.  Admits to some lightheadedness and dizziness triggered by lights and stimuli from her phone.  Says she has been diagnosed with vertigo in the past, has tried medicine for vertigo previously.  Currently taking olmesartan  20 mg daily. Denies any chest pain, shortness of breath, palpitations, syncope, presyncope, orthopnea, PND, swelling or significant weight changes, or claudication.  06/22/2023 -  Here for follow-up. BP readings  are labile per her report. She tells me of her new diagnosis of lung cancer, coping well. Will be seeing her oncologist on June 30, 2023. Does admit to some nausea with her chemotherapy. Does admit to intermittent dizziness, some improved compared to last office visit. Continues to note lightheadedness. Denies any chest pain, shortness of breath, palpitations,  syncope, presyncope, orthopnea, PND, swelling or significant weight changes, acute bleeding, or claudication.  Recently hospitalized d/t acute sepsis r/t diverticulits with contained performatin, tx with ABX and d/c on ABX. Recent suspected viral URI. Returned with recurrent fever without source. EKG showed rate controlled A-fib.   09/22/2023 - Here for follow-up. She admits to feeling tired and intermittent lightheadedness, now taking 40 mg of olmesartan  daily when she was previously taking 20 mg of olmesartan  daily. Denies any chest pain, shortness of breath, palpitations, syncope, presyncope, dizziness, orthopnea, PND, swelling or significant weight changes, acute bleeding, or claudication.  ROS: Negative. See HPI.   Studies Reviewed: SABRA    EKG: EKG is not ordered today.   Echo bubble study limited 07/2023:   1. Left ventricular ejection fraction, by estimation, is 55 to 60%. The  left ventricle has normal function. The left ventricle has no regional  wall motion abnormalities.   2. Right ventricular systolic function is normal. The right ventricular  size is normal.   3. The mitral valve is normal in structure. No evidence of mitral valve  regurgitation. No evidence of mitral stenosis.   4. The aortic valve was not well visualized. Aortic valve regurgitation  is moderate. No aortic stenosis is present.   5. Agitated saline contrast bubble study was negative, with no evidence  of any interatrial shunt.   6. Limited echo   Echo complete bubble study 07/2023:   1. Left ventricular ejection fraction, by estimation, is 55 to 60%. The  left ventricle has normal function. The left ventricle has no regional  wall motion abnormalities. There is mild left ventricular hypertrophy.  Left ventricular diastolic parameters  are indeterminate.   2. Right ventricular systolic function was not well visualized. The right  ventricular size is not well visualized.   3. The mitral valve is normal in  structure. No evidence of mitral valve  regurgitation. No evidence of mitral stenosis.   4. Cannot rule out mobile echodensity on the non coronary cusp. The  aortic valve is tricuspid. There is moderate calcification of the aortic  valve. Aortic valve regurgitation is moderate. Aortic valve sclerosis is  present, with no evidence of aortic  valve stenosis.   5. The inferior vena cava is dilated in size with >50% respiratory  variability, suggesting right atrial pressure of 8 mmHg.   6. Agitated saline contrast bubble study was negative, with no evidence  of any interatrial shunt.   Arterial duplex 06/2023:  Summary:  Right: Atherosclerotic plaque noted throughout the left lower extremity  with dampened monphasic doppler waveforms starting at the level of the  popliteal artery. May suggest hemodynamically significant stenosis at the  popliteal level.  Right ABI mildly reduced at 0.75   Left: Left: Atherosclerotic plaque noted throughout the left lower  extremity with dampened monphasic doppler waveforms starting at the level  of the popliteal artery. May suggest hemodynamically significant stenosis  at the popliteal artery level.  Left ABI moderately reduced at 0.55.     See table(s) above for measurements and observations.   Suggest follow up study in 12 months.  Suggest Peripheral Vascular Consult.  ABI's 06/2023:  Summary:  Right: Resting right ankle-brachial index indicates moderate right lower  extremity arterial disease. The right toe-brachial index is abnormal.   Left: Resting left ankle-brachial index indicates moderate left lower  extremity arterial disease. The left toe-brachial index is abnormal.    *See table(s) above for measurements and observations.    Suggest follow up study in 12 months.   ABI's 04/28/2023:  Summary:  Right: Resting right ankle-brachial index indicates moderate right lower  extremity arterial disease. The right toe-brachial index is  abnormal.   Left: Resting left ankle-brachial index indicates moderate left lower  extremity arterial disease. The left toe-brachial index is abnormal.    *See table(s) above for measurements and observations.    Suggest follow up study in 12 months.  Echo 04/2023:  1. Left ventricular ejection fraction, by estimation, is 60 to 65%. The  left ventricle has normal function. The left ventricle has no regional  wall motion abnormalities. Left ventricular diastolic parameters are  consistent with Grade II diastolic  dysfunction (pseudonormalization). Elevated left atrial pressure.   2. Right ventricular systolic function is normal. The right ventricular  size is normal. Tricuspid regurgitation signal is inadequate for assessing  PA pressure.   3. Left atrial size was mildly dilated.   4. The mitral valve is normal in structure. Trivial mitral valve  regurgitation. No evidence of mitral stenosis.   5. The aortic valve is tricuspid. Aortic valve regurgitation is mild to  moderate. No aortic stenosis is present.   6. The inferior vena cava is dilated in size with >50% respiratory  variability, suggesting right atrial pressure of 8 mmHg.  CT cardiac 03/2021:  IMPRESSION: 1. The left atrial appendage is a large chicken wing morphology without thrombus.   2. A 27 mm Watchman FLX device is recommended based on the above landing zone measurements (20.0 mm maximum diameter; 26% compression).   3. There is no thrombus in the left atrial appendage.   4. A mid/mid IAS puncture site is recommended.   5. Optimal deployment angle: RAO 1 CRA 23   6. Normal coronary origin. Right dominance. CAC score of 148, which is 94th percentile for age-, sex-, and race-matched controls.  LHC 07/2019:    Prox RCA to Mid RCA lesion is 25% stenosed. Prox LAD to Mid LAD lesion is 25% stenosed. The left ventricular systolic function is normal. The left ventricular ejection fraction is 55-65% by visual  estimate. LV end diastolic pressure is normal. LVEDP 15 mm Hg. There is no aortic valve stenosis.   Continue medical therapy.     Of note, patient was in AFib during the cath.  Rate was controlled and she did not feel palpitations.    Physical Exam:   VS:  There were no vitals taken for this visit.   Wt Readings from Last 3 Encounters:  10/19/23 232 lb (105.2 kg)  10/15/23 233 lb 6.4 oz (105.9 kg)  10/14/23 241 lb 9.6 oz (109.6 kg)    GEN: Obese, 63 y.o. female in no acute distress NECK: No JVD; No carotid bruits CARDIAC: S1/S2, RRR, no murmurs, rubs, gallops RESPIRATORY:  Clear to auscultation without rales, wheezing or rhonchi  ABDOMEN: Soft, non-tender, non-distended EXTREMITIES:  No edema; No deformity   ASSESSMENT AND PLAN: .   PAF, hx of bradycardia Denies any tachycardia or palpitations. HR is well controlled today. No more GI bleeding per her report. Continue diltiazem . Continue Xarelto  for stroke prevention. On appropriate dosage. Denies any recent  bleeding issues. Heart healthy diet and regular cardiovascular exercise encouraged.   HTN BP soft, intermittent lightheadedness with this.  Discussed to monitor BP at home at least 2 hours after medications and sitting for 5-10 minutes. Will reduce olmesartan  to 20 mg daily. Heart healthy diet and regular cardiovascular exercise encouraged.   HLD Most recent LDL 93. Continue rosuvastatin . Heart healthy diet and regular cardiovascular exercise encouraged.   Valvular insufficiency Mod AR noted on most recent Echo, no interatrial shunting noted on bubble study. Denies any symptoms. Plan to update Echo in 2 years or sooner if clinically indicated.   CKD stage 3a Most recent labs show stable kidney disease. Avoid nephrotoxic agents. Continue current medication regimen. Continue to follow with PCP.   Dispo: Follow-up with Dr. Dorn Ross or APP in 3 months or sooner if anything changes.   Signed, Almarie Crate, NP

## 2023-10-28 ENCOUNTER — Inpatient Hospital Stay: Admitting: Physician Assistant

## 2023-10-28 ENCOUNTER — Telehealth: Payer: Self-pay | Admitting: *Deleted

## 2023-10-28 ENCOUNTER — Inpatient Hospital Stay

## 2023-10-28 ENCOUNTER — Other Ambulatory Visit: Payer: Self-pay

## 2023-10-28 ENCOUNTER — Other Ambulatory Visit: Payer: Self-pay | Admitting: Physician Assistant

## 2023-10-28 VITALS — BP 105/61 | HR 78 | Temp 98.1°F | Resp 18

## 2023-10-28 VITALS — BP 115/56 | HR 79 | Temp 98.3°F | Resp 17 | Wt 232.9 lb

## 2023-10-28 DIAGNOSIS — C7801 Secondary malignant neoplasm of right lung: Secondary | ICD-10-CM

## 2023-10-28 DIAGNOSIS — E876 Hypokalemia: Secondary | ICD-10-CM

## 2023-10-28 DIAGNOSIS — D61818 Other pancytopenia: Secondary | ICD-10-CM | POA: Diagnosis not present

## 2023-10-28 DIAGNOSIS — R5383 Other fatigue: Secondary | ICD-10-CM

## 2023-10-28 DIAGNOSIS — R110 Nausea: Secondary | ICD-10-CM

## 2023-10-28 DIAGNOSIS — C3491 Malignant neoplasm of unspecified part of right bronchus or lung: Secondary | ICD-10-CM

## 2023-10-28 DIAGNOSIS — Z5112 Encounter for antineoplastic immunotherapy: Secondary | ICD-10-CM | POA: Diagnosis not present

## 2023-10-28 LAB — CBC WITH DIFFERENTIAL (CANCER CENTER ONLY)
Abs Immature Granulocytes: 0.01 K/uL (ref 0.00–0.07)
Basophils Absolute: 0 K/uL (ref 0.0–0.1)
Basophils Relative: 0 %
Eosinophils Absolute: 0.1 K/uL (ref 0.0–0.5)
Eosinophils Relative: 6 %
HCT: 26.7 % — ABNORMAL LOW (ref 36.0–46.0)
Hemoglobin: 9.5 g/dL — ABNORMAL LOW (ref 12.0–15.0)
Immature Granulocytes: 1 %
Lymphocytes Relative: 37 %
Lymphs Abs: 0.7 K/uL (ref 0.7–4.0)
MCH: 31.5 pg (ref 26.0–34.0)
MCHC: 35.6 g/dL (ref 30.0–36.0)
MCV: 88.4 fL (ref 80.0–100.0)
Monocytes Absolute: 0.3 K/uL (ref 0.1–1.0)
Monocytes Relative: 15 %
Neutro Abs: 0.8 K/uL — ABNORMAL LOW (ref 1.7–7.7)
Neutrophils Relative %: 41 %
Platelet Count: 123 K/uL — ABNORMAL LOW (ref 150–400)
RBC: 3.02 MIL/uL — ABNORMAL LOW (ref 3.87–5.11)
RDW: 13.3 % (ref 11.5–15.5)
WBC Count: 2 K/uL — ABNORMAL LOW (ref 4.0–10.5)
nRBC: 0 % (ref 0.0–0.2)

## 2023-10-28 LAB — CMP (CANCER CENTER ONLY)
ALT: 32 U/L (ref 0–44)
AST: 22 U/L (ref 15–41)
Albumin: 3.8 g/dL (ref 3.5–5.0)
Alkaline Phosphatase: 63 U/L (ref 38–126)
Anion gap: 8 (ref 5–15)
BUN: 15 mg/dL (ref 8–23)
CO2: 27 mmol/L (ref 22–32)
Calcium: 9.9 mg/dL (ref 8.9–10.3)
Chloride: 104 mmol/L (ref 98–111)
Creatinine: 1.86 mg/dL — ABNORMAL HIGH (ref 0.44–1.00)
GFR, Estimated: 30 mL/min — ABNORMAL LOW (ref >60)
Glucose, Bld: 132 mg/dL — ABNORMAL HIGH (ref 70–99)
Potassium: 3.1 mmol/L — ABNORMAL LOW (ref 3.5–5.1)
Sodium: 139 mmol/L (ref 135–145)
Total Bilirubin: 0.6 mg/dL (ref 0.0–1.2)
Total Protein: 6.3 g/dL — ABNORMAL LOW (ref 6.5–8.1)

## 2023-10-28 LAB — MAGNESIUM: Magnesium: 1.4 mg/dL — ABNORMAL LOW (ref 1.7–2.4)

## 2023-10-28 MED ORDER — MAGNESIUM SULFATE 2 GM/50ML IV SOLN
2.0000 g | Freq: Once | INTRAVENOUS | Status: AC
  Administered 2023-10-28: 2 g via INTRAVENOUS
  Filled 2023-10-28: qty 50

## 2023-10-28 MED ORDER — SODIUM CHLORIDE 0.9 % IV SOLN: Freq: Once | INTRAVENOUS | Status: AC

## 2023-10-28 MED ORDER — POTASSIUM CHLORIDE 10 MEQ/100ML IV SOLN
10.0000 meq | INTRAVENOUS | Status: AC
  Administered 2023-10-28 (×2): 10 meq via INTRAVENOUS
  Filled 2023-10-28 (×2): qty 100

## 2023-10-28 MED ORDER — FAMOTIDINE IN NACL 20-0.9 MG/50ML-% IV SOLN
20.0000 mg | Freq: Once | INTRAVENOUS | Status: AC
  Administered 2023-10-28: 20 mg via INTRAVENOUS
  Filled 2023-10-28: qty 50

## 2023-10-28 MED ORDER — ONDANSETRON HCL 4 MG/2ML IJ SOLN
4.0000 mg | Freq: Once | INTRAMUSCULAR | Status: AC
  Administered 2023-10-28: 4 mg via INTRAVENOUS
  Filled 2023-10-28: qty 2

## 2023-10-28 NOTE — Progress Notes (Signed)
 Per Mallie RIGGERS OK to infuse an extra 500 cc's of NS today d/t BP 94/60.

## 2023-10-28 NOTE — Progress Notes (Signed)
 Orders placed for [facility] appt today.

## 2023-10-28 NOTE — Telephone Encounter (Signed)
 Returned PC to patient, she called earlier C/O nausea (relieved with zofran ), weakness, HA's, low BP - symptoms have been going on 2-3 days. She denies fever, vomiting or diarrhea.  She saw her cardiologist yesterday & her BP meds were adjusted for hypotension.  Dr Sherrod informed, he requested patient be seen in [facility].  Informed patient, she has appointment in [facility] today at 2:00, she verbalizes understanding.

## 2023-10-28 NOTE — Patient Instructions (Signed)
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
## 2023-10-29 ENCOUNTER — Ambulatory Visit (HOSPITAL_COMMUNITY)

## 2023-10-29 ENCOUNTER — Encounter (HOSPITAL_COMMUNITY): Payer: Self-pay

## 2023-10-29 ENCOUNTER — Encounter: Payer: Self-pay | Admitting: Internal Medicine

## 2023-10-29 DIAGNOSIS — Z7409 Other reduced mobility: Secondary | ICD-10-CM

## 2023-10-29 DIAGNOSIS — M6281 Muscle weakness (generalized): Secondary | ICD-10-CM

## 2023-10-29 DIAGNOSIS — R29898 Other symptoms and signs involving the musculoskeletal system: Secondary | ICD-10-CM
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
## 2023-11-02 ENCOUNTER — Inpatient Hospital Stay

## 2023-11-02 ENCOUNTER — Inpatient Hospital Stay: Admitting: Internal Medicine

## 2023-11-03 ENCOUNTER — Other Ambulatory Visit: Payer: Self-pay | Admitting: Physician Assistant

## 2023-11-03 ENCOUNTER — Ambulatory Visit (HOSPITAL_COMMUNITY)

## 2023-11-03 DIAGNOSIS — M6281 Muscle weakness (generalized): Secondary | ICD-10-CM

## 2023-11-03 DIAGNOSIS — R29898 Other symptoms and signs involving the musculoskeletal system: Secondary | ICD-10-CM

## 2023-11-03 DIAGNOSIS — Z7409 Other reduced mobility: Secondary | ICD-10-CM | POA: Diagnosis not present
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
## 2023-11-04 ENCOUNTER — Inpatient Hospital Stay (HOSPITAL_BASED_OUTPATIENT_CLINIC_OR_DEPARTMENT_OTHER): Admitting: Physician Assistant

## 2023-11-04 ENCOUNTER — Inpatient Hospital Stay

## 2023-11-04 ENCOUNTER — Other Ambulatory Visit: Payer: Self-pay

## 2023-11-04 VITALS — BP 149/86 | HR 116 | Resp 20 | Wt 229.6 lb

## 2023-11-04 DIAGNOSIS — H6123 Impacted cerumen, bilateral: Secondary | ICD-10-CM

## 2023-11-04 DIAGNOSIS — E876 Hypokalemia: Secondary | ICD-10-CM

## 2023-11-04 DIAGNOSIS — C7972 Secondary malignant neoplasm of left adrenal gland: Secondary | ICD-10-CM

## 2023-11-04 DIAGNOSIS — Z5112 Encounter for antineoplastic immunotherapy: Secondary | ICD-10-CM | POA: Diagnosis not present

## 2023-11-04 DIAGNOSIS — R110 Nausea: Secondary | ICD-10-CM

## 2023-11-04 LAB — CBC WITH DIFFERENTIAL (CANCER CENTER ONLY)
Abs Immature Granulocytes: 0.03 K/uL (ref 0.00–0.07)
Basophils Absolute: 0 K/uL (ref 0.0–0.1)
Basophils Relative: 1 %
Eosinophils Absolute: 0.1 K/uL (ref 0.0–0.5)
Eosinophils Relative: 3 %
HCT: 27 % — ABNORMAL LOW (ref 36.0–46.0)
Hemoglobin: 9.3 g/dL — ABNORMAL LOW (ref 12.0–15.0)
Immature Granulocytes: 1 %
Lymphocytes Relative: 23 %
Lymphs Abs: 0.7 K/uL (ref 0.7–4.0)
MCH: 31 pg (ref 26.0–34.0)
MCHC: 34.4 g/dL (ref 30.0–36.0)
MCV: 90 fL (ref 80.0–100.0)
Monocytes Absolute: 0.6 K/uL (ref 0.1–1.0)
Monocytes Relative: 22 %
Neutro Abs: 1.4 K/uL — ABNORMAL LOW (ref 1.7–7.7)
Neutrophils Relative %: 50 %
Platelet Count: 171 K/uL (ref 150–400)
RBC: 3 MIL/uL — ABNORMAL LOW (ref 3.87–5.11)
RDW: 13.2 % (ref 11.5–15.5)
WBC Count: 2.8 K/uL — ABNORMAL LOW (ref 4.0–10.5)
nRBC: 0 % (ref 0.0–0.2)

## 2023-11-04 LAB — CMP (CANCER CENTER ONLY)
ALT: 24 U/L (ref 0–44)
AST: 26 U/L (ref 15–41)
Albumin: 4.1 g/dL (ref 3.5–5.0)
Alkaline Phosphatase: 64 U/L (ref 38–126)
Anion gap: 9 (ref 5–15)
BUN: 7 mg/dL — ABNORMAL LOW (ref 8–23)
CO2: 28 mmol/L (ref 22–32)
Calcium: 9.8 mg/dL (ref 8.9–10.3)
Chloride: 104 mmol/L (ref 98–111)
Creatinine: 1.21 mg/dL — ABNORMAL HIGH (ref 0.44–1.00)
GFR, Estimated: 51 mL/min — ABNORMAL LOW (ref >60)
Glucose, Bld: 107 mg/dL — ABNORMAL HIGH (ref 70–99)
Potassium: 3.2 mmol/L — ABNORMAL LOW (ref 3.5–5.1)
Sodium: 141 mmol/L (ref 135–145)
Total Bilirubin: 0.6 mg/dL (ref 0.0–1.2)
Total Protein: 6.8 g/dL (ref 6.5–8.1)

## 2023-11-04 LAB — MAGNESIUM: Magnesium: 1.3 mg/dL — ABNORMAL LOW (ref 1.7–2.4)

## 2023-11-04 MED ORDER — SODIUM CHLORIDE 0.9 % IV SOLN: Freq: Once | INTRAVENOUS | Status: AC

## 2023-11-04 MED ORDER — POTASSIUM CHLORIDE 10 MEQ/100ML IV SOLN
10.0000 meq | INTRAVENOUS | Status: AC
  Administered 2023-11-04 (×2): 10 meq via INTRAVENOUS
  Filled 2023-11-04 (×2): qty 100

## 2023-11-04 MED ORDER — MAGNESIUM OXIDE -MG SUPPLEMENT 400 (240 MG) MG PO TABS: 400.0000 mg | ORAL_TABLET | Freq: Every day | ORAL | 0 refills | Status: DC

## 2023-11-04 MED ORDER — FAMOTIDINE IN NACL 20-0.9 MG/50ML-% IV SOLN
20.0000 mg | Freq: Once | INTRAVENOUS | Status: AC
  Administered 2023-11-04: 20 mg via INTRAVENOUS
  Filled 2023-11-04: qty 50

## 2023-11-04 MED ORDER — PROCHLORPERAZINE EDISYLATE 10 MG/2ML IJ SOLN
10.0000 mg | Freq: Once | INTRAMUSCULAR | Status: AC
  Administered 2023-11-04: 10 mg via INTRAVENOUS
  Filled 2023-11-04: qty 2

## 2023-11-04 MED ORDER — MAGNESIUM SULFATE 4 GM/100ML IV SOLN
4.0000 g | Freq: Once | INTRAVENOUS | Status: AC
  Administered 2023-11-04: 4 g via INTRAVENOUS
  Filled 2023-11-04: qty 100
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
## 2023-11-04 NOTE — Patient Instructions (Signed)

## 2023-11-04 NOTE — Progress Notes (Signed)
 Orders placed for [facility] appt today.

## 2023-11-04 NOTE — Patient Instructions (Addendum)
 To help with ear wax removal- try over the counter Debrox. Use as directed on the package.  Try taking your nausea medication more consistently. For example if you take Zofran  at 8 am and your nausea is still bothersome at 10 am you can take the Compazine . Just make sure you are still taking the Zofran  doses 8 hours apart and the compazine  6 hours apart.  Please try different electrolyte solutions like Gatorade or Pedialyte to stay hydrated.    If you acid reflux is severe you can take over the counter Pepcid  occasionally.   Prescription sent to the pharmacy for magnesium  pills. You should take them for 1 week. You should also continue to take potassium pills once daily until your next clinic appointment when your potassium will be rechecked.
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
## 2023-11-06 ENCOUNTER — Ambulatory Visit (HOSPITAL_COMMUNITY)

## 2023-11-06 ENCOUNTER — Encounter (HOSPITAL_COMMUNITY): Payer: Self-pay

## 2023-11-06 DIAGNOSIS — Z7409 Other reduced mobility: Secondary | ICD-10-CM

## 2023-11-06 DIAGNOSIS — M6281 Muscle weakness (generalized): Secondary | ICD-10-CM

## 2023-11-06 DIAGNOSIS — R29898 Other symptoms and signs involving the musculoskeletal system: Secondary | ICD-10-CM
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
## 2023-11-09 ENCOUNTER — Inpatient Hospital Stay

## 2023-11-09 ENCOUNTER — Inpatient Hospital Stay: Admitting: Physician Assistant

## 2023-11-09 ENCOUNTER — Other Ambulatory Visit: Payer: Self-pay | Admitting: Physician Assistant

## 2023-11-09 ENCOUNTER — Encounter: Payer: Self-pay | Admitting: Radiology

## 2023-11-09 VITALS — BP 139/96 | HR 66 | Temp 98.0°F | Resp 18 | Ht 67.0 in | Wt 226.0 lb

## 2023-11-09 VITALS — BP 133/76 | HR 84 | Temp 98.0°F | Resp 18

## 2023-11-09 DIAGNOSIS — Z5112 Encounter for antineoplastic immunotherapy: Secondary | ICD-10-CM | POA: Insufficient documentation

## 2023-11-09 DIAGNOSIS — C7931 Secondary malignant neoplasm of brain: Secondary | ICD-10-CM | POA: Insufficient documentation

## 2023-11-09 DIAGNOSIS — C7972 Secondary malignant neoplasm of left adrenal gland: Secondary | ICD-10-CM | POA: Insufficient documentation

## 2023-11-09 DIAGNOSIS — E860 Dehydration: Secondary | ICD-10-CM | POA: Insufficient documentation

## 2023-11-09 DIAGNOSIS — D693 Immune thrombocytopenic purpura: Secondary | ICD-10-CM | POA: Insufficient documentation

## 2023-11-09 DIAGNOSIS — C3411 Malignant neoplasm of upper lobe, right bronchus or lung: Secondary | ICD-10-CM | POA: Insufficient documentation

## 2023-11-09 DIAGNOSIS — Z79620 Long term (current) use of immunosuppressive biologic: Secondary | ICD-10-CM | POA: Insufficient documentation

## 2023-11-09 DIAGNOSIS — Z5111 Encounter for antineoplastic chemotherapy: Secondary | ICD-10-CM | POA: Insufficient documentation

## 2023-11-09 DIAGNOSIS — C3491 Malignant neoplasm of unspecified part of right bronchus or lung: Secondary | ICD-10-CM

## 2023-11-09 DIAGNOSIS — C7951 Secondary malignant neoplasm of bone: Secondary | ICD-10-CM | POA: Insufficient documentation

## 2023-11-09 DIAGNOSIS — Z7409 Other reduced mobility: Secondary | ICD-10-CM | POA: Diagnosis present

## 2023-11-09 DIAGNOSIS — R530 Neoplastic (malignant) related fatigue: Secondary | ICD-10-CM | POA: Insufficient documentation

## 2023-11-09 DIAGNOSIS — M6281 Muscle weakness (generalized): Secondary | ICD-10-CM | POA: Diagnosis present

## 2023-11-09 DIAGNOSIS — D649 Anemia, unspecified: Secondary | ICD-10-CM

## 2023-11-09 DIAGNOSIS — R29898 Other symptoms and signs involving the musculoskeletal system: Secondary | ICD-10-CM | POA: Diagnosis present

## 2023-11-09 LAB — CBC WITH DIFFERENTIAL (CANCER CENTER ONLY)
Abs Immature Granulocytes: 0.05 K/uL (ref 0.00–0.07)
Basophils Absolute: 0 K/uL (ref 0.0–0.1)
Basophils Relative: 0 %
Eosinophils Absolute: 0.1 K/uL (ref 0.0–0.5)
Eosinophils Relative: 2 %
HCT: 27.6 % — ABNORMAL LOW (ref 36.0–46.0)
Hemoglobin: 9.6 g/dL — ABNORMAL LOW (ref 12.0–15.0)
Immature Granulocytes: 1 %
Lymphocytes Relative: 18 %
Lymphs Abs: 0.8 K/uL (ref 0.7–4.0)
MCH: 31.6 pg (ref 26.0–34.0)
MCHC: 34.8 g/dL (ref 30.0–36.0)
MCV: 90.8 fL (ref 80.0–100.0)
Monocytes Absolute: 0.9 K/uL (ref 0.1–1.0)
Monocytes Relative: 21 %
Neutro Abs: 2.5 K/uL (ref 1.7–7.7)
Neutrophils Relative %: 58 %
Platelet Count: 372 K/uL (ref 150–400)
RBC: 3.04 MIL/uL — ABNORMAL LOW (ref 3.87–5.11)
RDW: 14.1 % (ref 11.5–15.5)
WBC Count: 4.3 K/uL (ref 4.0–10.5)
nRBC: 0 % (ref 0.0–0.2)

## 2023-11-09 LAB — CMP (CANCER CENTER ONLY)
ALT: 30 U/L (ref 0–44)
AST: 34 U/L (ref 15–41)
Albumin: 4.1 g/dL (ref 3.5–5.0)
Alkaline Phosphatase: 71 U/L (ref 38–126)
Anion gap: 10 (ref 5–15)
BUN: <5 mg/dL — ABNORMAL LOW (ref 8–23)
CO2: 25 mmol/L (ref 22–32)
Calcium: 9.9 mg/dL (ref 8.9–10.3)
Chloride: 104 mmol/L (ref 98–111)
Creatinine: 1.26 mg/dL — ABNORMAL HIGH (ref 0.44–1.00)
GFR, Estimated: 48 mL/min — ABNORMAL LOW (ref >60)
Glucose, Bld: 116 mg/dL — ABNORMAL HIGH (ref 70–99)
Potassium: 3.6 mmol/L (ref 3.5–5.1)
Sodium: 139 mmol/L (ref 135–145)
Total Bilirubin: 0.5 mg/dL (ref 0.0–1.2)
Total Protein: 6.8 g/dL (ref 6.5–8.1)

## 2023-11-09 LAB — TSH: TSH: 1.78 u[IU]/mL (ref 0.350–4.500)

## 2023-11-09 LAB — MAGNESIUM: Magnesium: 1.5 mg/dL — ABNORMAL LOW (ref 1.7–2.4)

## 2023-11-09 MED ORDER — SODIUM CHLORIDE 0.9 % IV SOLN
400.0000 mg/m2 | Freq: Once | INTRAVENOUS | Status: AC
  Administered 2023-11-09: 900 mg via INTRAVENOUS
  Filled 2023-11-09: qty 20

## 2023-11-09 MED ORDER — SODIUM CHLORIDE 0.9% FLUSH: 10.0000 mL | INTRAVENOUS | Status: DC | PRN

## 2023-11-09 MED ORDER — MAGNESIUM OXIDE -MG SUPPLEMENT 400 (240 MG) MG PO TABS: 400.0000 mg | ORAL_TABLET | Freq: Two times a day (BID) | ORAL | 1 refills | Status: DC

## 2023-11-09 MED ORDER — APREPITANT 130 MG/18ML IV EMUL
130.0000 mg | Freq: Once | INTRAVENOUS | Status: AC
  Administered 2023-11-09: 130 mg via INTRAVENOUS
  Filled 2023-11-09: qty 18

## 2023-11-09 MED ORDER — SODIUM CHLORIDE 0.9 % IV SOLN
200.0000 mg | Freq: Once | INTRAVENOUS | Status: AC
  Administered 2023-11-09: 200 mg via INTRAVENOUS
  Filled 2023-11-09: qty 200

## 2023-11-09 MED ORDER — SODIUM CHLORIDE 0.9 % IV SOLN: INTRAVENOUS | Status: DC

## 2023-11-09 MED ORDER — ONDANSETRON HCL 8 MG PO TABS: 8.0000 mg | ORAL_TABLET | Freq: Three times a day (TID) | ORAL | 11 refills | Status: DC | PRN

## 2023-11-09 MED ORDER — PALONOSETRON HCL INJECTION 0.25 MG/5ML
0.2500 mg | Freq: Once | INTRAVENOUS | Status: AC
  Administered 2023-11-09: 0.25 mg via INTRAVENOUS
  Filled 2023-11-09: qty 5

## 2023-11-09 MED ORDER — PROCHLORPERAZINE MALEATE 10 MG PO TABS: 10.0000 mg | ORAL_TABLET | Freq: Four times a day (QID) | ORAL | 11 refills | Status: AC | PRN

## 2023-11-09 MED ORDER — CYANOCOBALAMIN 1000 MCG/ML IJ SOLN
1000.0000 ug | Freq: Once | INTRAMUSCULAR | Status: AC
  Administered 2023-11-09: 1000 ug via INTRAMUSCULAR
  Filled 2023-11-09: qty 1

## 2023-11-09 MED ORDER — SODIUM CHLORIDE 0.9 % IV SOLN
422.0000 mg | Freq: Once | INTRAVENOUS | Status: AC
  Administered 2023-11-09: 420 mg via INTRAVENOUS
  Filled 2023-11-09: qty 42

## 2023-11-09 MED ORDER — DEXAMETHASONE SOD PHOSPHATE PF 10 MG/ML IJ SOLN
10.0000 mg | Freq: Once | INTRAMUSCULAR | Status: AC
  Administered 2023-11-09: 10 mg via INTRAVENOUS

## 2023-11-09 NOTE — Progress Notes (Signed)
 Nutrition Follow-up:  Patient with stage Iv non-small cell lung cancer.  Receiving chemotherapy and keytruda .   Met with patient and son during infusion.  Reports that she is still having issues with nausea and taste alterations.  Is making her on smoothies with fruit, kale, unflavored pedialyte.  Eating applesauce, oatmeal.  Boost breeze/ensure clear is sweet for her and she has tried diluting with water .  Has been trying to take the zofran  and compazine  on a rotating basis.      Medications: compazine , amaryl , zofran , miralax   Labs: glucose 116, creatinine 1.26, Mag 1.5  Anthropometrics:   Weight 226 lb today 232 lb on 10/13 257 lb on 8/7 (BLE edema)   NUTRITION DIAGNOSIS: Inadequate oral intake continues    INTERVENTION:  Samples of unjury protein powder given to try added to smoothie.   Discussed diluting juice shakes with unsweet tea.     MONITORING, EVALUATION, GOAL: weight trends, intake   NEXT VISIT: Tuesday, Nov 25 during infusion  Karstyn Birkey B. Dasie SOLON, CSO, LDN Registered Dietitian [phone]

## 2023-11-09 NOTE — Patient Instructions (Signed)
 [facility] - A DEPT OF [facility]  Discharge Instructions: Thank you for choosing [facility] to provide your oncology and hematology care.   If you have a lab appointment with the Cancer Center, please go directly to the Cancer Center and check in at the registration area.   Wear comfortable clothing and clothing appropriate for easy access to any Portacath or PICC line.   We strive to give you quality time with your provider. You may need to reschedule your appointment if you arrive late (15 or more minutes).  Arriving late affects you and other patients whose appointments are after yours.  Also, if you miss three or more appointments without notifying the office, you may be dismissed from the clinic at the provider's discretion.      For prescription refill requests, have your pharmacy contact our office and allow 72 hours for refills to be completed.    Today you received the following chemotherapy and/or immunotherapy agents: Keytruda, Alimta, Carboplatin      To help prevent nausea and vomiting after your treatment, we encourage you to take your nausea medication as directed.  BELOW ARE SYMPTOMS THAT SHOULD BE REPORTED IMMEDIATELY: *FEVER GREATER THAN 100.4 F (38 C) OR HIGHER *CHILLS OR SWEATING *NAUSEA AND VOMITING THAT IS NOT CONTROLLED WITH YOUR NAUSEA MEDICATION *UNUSUAL SHORTNESS OF BREATH *UNUSUAL BRUISING OR BLEEDING *URINARY PROBLEMS (pain or burning when urinating, or frequent urination) *BOWEL PROBLEMS (unusual diarrhea, constipation, pain near the anus) TENDERNESS IN MOUTH AND THROAT WITH OR WITHOUT PRESENCE OF ULCERS (sore throat, sores in mouth, or a toothache) UNUSUAL RASH, SWELLING OR PAIN  UNUSUAL VAGINAL DISCHARGE OR ITCHING   Items with * indicate a potential emergency and should be followed up as soon as possible or go to the Emergency Department if any problems should occur.  Please show the CHEMOTHERAPY ALERT  CARD or IMMUNOTHERAPY ALERT CARD at check-in to the Emergency Department and triage nurse.  Should you have questions after your visit or need to cancel or reschedule your appointment, please contact [facility] - A DEPT OF [facility]  Dept: [phone]  and follow the prompts.  Office hours are 8:00 a.m. to 4:30 p.m. Monday - Friday. Please note that voicemails left after 4:00 p.m. may not be returned until the following business day.  We are closed weekends and major holidays. You have access to a nurse at all times for urgent questions. Please call the main number to the clinic Dept: [phone] and follow the prompts.   For any non-urgent questions, you may also contact your provider using MyChart. We now offer e-Visits for anyone 35 and older to request care online for non-urgent symptoms. For details visit [email or web address].   Also download the MyChart app! Go to the app store, search "MyChart", open the app, select [place], and log in with your MyChart username and password.

## 2023-11-10 ENCOUNTER — Ambulatory Visit (HOSPITAL_COMMUNITY): Attending: Internal Medicine

## 2023-11-10 DIAGNOSIS — Z7409 Other reduced mobility: Secondary | ICD-10-CM | POA: Insufficient documentation

## 2023-11-10 DIAGNOSIS — R29898 Other symptoms and signs involving the musculoskeletal system: Secondary | ICD-10-CM | POA: Insufficient documentation

## 2023-11-10 DIAGNOSIS — M6281 Muscle weakness (generalized): Secondary | ICD-10-CM | POA: Insufficient documentation

## 2023-11-10 LAB — T4: T4, Total: 8.6 ug/dL (ref 4.5–12.0)
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
## 2023-11-13 ENCOUNTER — Encounter (HOSPITAL_COMMUNITY): Payer: Self-pay

## 2023-11-13 ENCOUNTER — Ambulatory Visit (HOSPITAL_COMMUNITY)

## 2023-11-13 ENCOUNTER — Telehealth: Payer: Self-pay

## 2023-11-13 DIAGNOSIS — Z7409 Other reduced mobility: Secondary | ICD-10-CM

## 2023-11-13 DIAGNOSIS — R29898 Other symptoms and signs involving the musculoskeletal system: Secondary | ICD-10-CM

## 2023-11-13 DIAGNOSIS — M6281 Muscle weakness (generalized): Secondary | ICD-10-CM

## 2023-11-13 NOTE — Telephone Encounter (Signed)
 Copied from CRM [id number]. Topic: Referral - Request for Referral >> Nov 13, 2023  8:27 AM Rosaria BRAVO wrote: Precious from [facility] says that the patient needs an updated referral for her diabetes  Best contact: [phone]
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
## 2023-11-16 ENCOUNTER — Telehealth: Payer: Self-pay | Admitting: *Deleted

## 2023-11-16 ENCOUNTER — Other Ambulatory Visit: Payer: Self-pay

## 2023-11-16 ENCOUNTER — Encounter: Attending: Family Medicine | Admitting: Nutrition

## 2023-11-16 ENCOUNTER — Encounter: Payer: Self-pay | Admitting: Nutrition

## 2023-11-16 VITALS — Ht 67.0 in | Wt 223.0 lb

## 2023-11-16 DIAGNOSIS — E118 Type 2 diabetes mellitus with unspecified complications: Secondary | ICD-10-CM | POA: Insufficient documentation

## 2023-11-16 DIAGNOSIS — E876 Hypokalemia: Secondary | ICD-10-CM | POA: Diagnosis not present

## 2023-11-16 DIAGNOSIS — K529 Noninfective gastroenteritis and colitis, unspecified: Secondary | ICD-10-CM | POA: Diagnosis not present

## 2023-11-16 DIAGNOSIS — E1169 Type 2 diabetes mellitus with other specified complication: Secondary | ICD-10-CM

## 2023-11-16 NOTE — Progress Notes (Signed)
 Medical Nutrition Therapy 1115         1145  Primary concerns today: DM Type 2, Obesity  Referral diagnosis: E11.8, E66.01 Preferred learning style: No preference  Learning readiness: Ready   NUTRITION ASSESSMENT  Follow up   Checking blood sugars in am; 118-130's.. Runs low sometimes 77, 69, 72, 80-90's Has reduced Glipizide to 1 mg a day and sometimes doesn't take it at all due to blood sugars. Has poor appetite. Gets nausated with smells and some foods. Is going to order unjury to needed protein. Her daughter is here and has been trying to make some smoothies. Has drank some ensure or glucerna at times. Lost another 3 lbs.  Says she is weak and no energy. Getting Chemo for lung cancer. Willing to work on trying to eat small frequent meals with supplements. Suggest she stop taking Glipizide since A1C was below 6% last time to prevent hypoglycemia and risk of falls. Has seen RD at Oncology clinic and will see her weekly.  Anthropometrics  Wt Readings from Last 3 Encounters:  11/09/23 226 lb (102.5 kg)  11/04/23 229 lb 9 oz (104.1 kg)  10/28/23 232 lb 14.4 oz (105.6 kg)   Ht Readings from Last 3 Encounters:  11/09/23 5' 7 (1.702 m)  10/27/23 5' 7 (1.702 m)  10/19/23 5' 7 (1.702 m)   There is no height or weight on file to calculate BMI. @BMIFA @ Facility age limit for growth %iles is 20 years. Facility age limit for growth %iles is 20 years.   Clinical Medical Hx: HTN, Afib, CAD, DM Type 2, GERD,Hypothyroidism, Osteoarthritis. Hyperlipidemia. Medications: See chart Labs:     Latest Ref Rng & Units 11/09/2023    9:58 AM 11/04/2023   10:36 AM 10/28/2023    2:14 PM  CMP  Glucose 70 - 99 mg/dL 883  892  867   BUN 8 - 23 mg/dL 5  7  15    Creatinine 0.44 - 1.00 mg/dL 8.73  8.78  8.13   Sodium 135 - 145 mmol/L 139  141  139   Potassium 3.5 - 5.1 mmol/L 3.6  3.2  3.1   Chloride 98 - 111 mmol/L 104  104  104   CO2 22 - 32 mmol/L 25  28  27    Calcium  8.9 - 10.3 mg/dL 9.9   9.8  9.9   Total Protein 6.5 - 8.1 g/dL 6.8  6.8  6.3   Total Bilirubin 0.0 - 1.2 mg/dL 0.5  0.6  0.6   Alkaline Phos 38 - 126 U/L 71  64  63   AST 15 - 41 U/L 34  26  22   ALT 0 - 44 U/L 30  24  32    Lipid Panel     Component Value Date/Time   CHOL 161 07/13/2023 0400   CHOL 231 (H) 01/01/2017 1139   TRIG 89 07/13/2023 0400   HDL 49 07/13/2023 0400   HDL 57 01/01/2017 1139   CHOLHDL 3.3 07/13/2023 0400   VLDL 18 07/13/2023 0400   LDLCALC 94 07/13/2023 0400   LDLCALC 154 (H) 01/01/2017 1139   LABVLDL 20 01/01/2017 1139    Notable Signs/Symptoms: None  Lifestyle & Dietary Hx Lives with her husband. She cooks some and they eat out or get take out often.  Estimated daily fluid intake: 30 oz Supplements:  Sleep: poor Stress / self-care:  Current average weekly physical activity: ADL  Painful to walk and exercise due to knee issues.  24-Hr Dietary Recall B) Cornflakes with whole milk L) Baked chicken, green beans, creamed potatoes, water  D) Toss salad with tomatoes, cukes,  water  Estimated Energy Needs Calories: 1200 Carbohydrate: 135g Protein: 90g Fat: 33g   NUTRITION DIAGNOSIS  NI-1.7 Predicted excessive energy intake As related to Morbid Obesity and Type 2 DM.  As evidenced by A1C > 6.5% and BMI 41.SABRA   NUTRITION INTERVENTION  Nutrition education (E-1) on the following topics:  Nutrition during chemo treatments Nausea and GI side effe   Handouts Provided Include    Learning Style & Readiness for Change Teaching method utilized: Visual & Auditory  Demonstrated degree of understanding via: Teach Back  Barriers to learning/adherence to lifestyle change: none  Goals Established by Pt  Try small frequent meals Eat foods cold temperature Try UNJURY mixed with smoothies Try soups Use walker or wheelchair for safety when going to appointments due to weakness and fatigue.  MONITORING & EVALUATION Dietary intake, weekly physical activity, and blood sugars  PRN since she is working with RD at Oncology.  Next Steps  Patient is to work on  eating better balanced meals.SABRA

## 2023-11-16 NOTE — Telephone Encounter (Signed)
 Spoke with pt. and informed her appt. at [facility] 11/17/23 at 1300 and encouraged her to keep her Ortho appt. Pt. states that works for her.

## 2023-11-16 NOTE — Telephone Encounter (Signed)
 Referral placed.

## 2023-11-16 NOTE — Telephone Encounter (Signed)
 Kindly place referral

## 2023-11-16 NOTE — Patient Instructions (Signed)
  Try small frequent meals Eat foods cold temperature Try UNJURY mixed with smoothies Try soups Use walker or wheelchair for safety when going to appointments due to weakness and fatigue.

## 2023-11-16 NOTE — Telephone Encounter (Signed)
 Received call from daughter Luke requesting an appt. States pt. is having weakness and not able to get up much at all. Denies chest pain, dizziness, and shortness of breath. She has an Orthopedic appt. 11/17/23 and I request she keep it and also has a treatment appt. 11/19/23. Daughter still request she be seen earlier. Message sent to [facility] for availability.

## 2023-11-17 ENCOUNTER — Other Ambulatory Visit: Payer: Self-pay

## 2023-11-17 ENCOUNTER — Encounter (HOSPITAL_COMMUNITY): Payer: Self-pay

## 2023-11-17 ENCOUNTER — Inpatient Hospital Stay

## 2023-11-17 ENCOUNTER — Emergency Department (HOSPITAL_COMMUNITY)

## 2023-11-17 ENCOUNTER — Ambulatory Visit (HOSPITAL_COMMUNITY)

## 2023-11-17 ENCOUNTER — Other Ambulatory Visit

## 2023-11-17 ENCOUNTER — Encounter (HOSPITAL_COMMUNITY): Payer: Self-pay | Admitting: Internal Medicine

## 2023-11-17 ENCOUNTER — Ambulatory Visit

## 2023-11-17 ENCOUNTER — Other Ambulatory Visit: Payer: Self-pay | Admitting: Physician Assistant

## 2023-11-17 ENCOUNTER — Ambulatory Visit: Admitting: Internal Medicine

## 2023-11-17 ENCOUNTER — Inpatient Hospital Stay (HOSPITAL_COMMUNITY)
Admission: EM | Admit: 2023-11-17 | Discharge: 2023-11-22 | DRG: 391 | Disposition: A | Discharge status: Home / Self Care | Attending: Emergency Medicine | Admitting: Emergency Medicine

## 2023-11-17 VITALS — BP 124/78 | HR 104 | Temp 97.9°F | Resp 24 | Wt 224.6 lb

## 2023-11-17 DIAGNOSIS — I480 Paroxysmal atrial fibrillation: Secondary | ICD-10-CM | POA: Diagnosis present

## 2023-11-17 DIAGNOSIS — Z8249 Family history of ischemic heart disease and other diseases of the circulatory system: Secondary | ICD-10-CM

## 2023-11-17 DIAGNOSIS — Z91040 Latex allergy status: Secondary | ICD-10-CM

## 2023-11-17 DIAGNOSIS — M6281 Muscle weakness (generalized): Secondary | ICD-10-CM

## 2023-11-17 DIAGNOSIS — D649 Anemia, unspecified: Secondary | ICD-10-CM | POA: Diagnosis present

## 2023-11-17 DIAGNOSIS — K529 Noninfective gastroenteritis and colitis, unspecified: Secondary | ICD-10-CM | POA: Diagnosis not present

## 2023-11-17 DIAGNOSIS — Z96651 Presence of right artificial knee joint: Secondary | ICD-10-CM | POA: Diagnosis present

## 2023-11-17 DIAGNOSIS — Z7982 Long term (current) use of aspirin: Secondary | ICD-10-CM

## 2023-11-17 DIAGNOSIS — D61818 Other pancytopenia: Secondary | ICD-10-CM | POA: Diagnosis present

## 2023-11-17 DIAGNOSIS — T451X5A Adverse effect of antineoplastic and immunosuppressive drugs, initial encounter: Secondary | ICD-10-CM | POA: Diagnosis present

## 2023-11-17 DIAGNOSIS — E7849 Other hyperlipidemia: Secondary | ICD-10-CM | POA: Diagnosis present

## 2023-11-17 DIAGNOSIS — Z7409 Other reduced mobility: Secondary | ICD-10-CM

## 2023-11-17 DIAGNOSIS — E1169 Type 2 diabetes mellitus with other specified complication: Secondary | ICD-10-CM | POA: Diagnosis present

## 2023-11-17 DIAGNOSIS — C7931 Secondary malignant neoplasm of brain: Secondary | ICD-10-CM | POA: Diagnosis present

## 2023-11-17 DIAGNOSIS — Z87891 Personal history of nicotine dependence: Secondary | ICD-10-CM

## 2023-11-17 DIAGNOSIS — Z7984 Long term (current) use of oral hypoglycemic drugs: Secondary | ICD-10-CM

## 2023-11-17 DIAGNOSIS — E039 Hypothyroidism, unspecified: Secondary | ICD-10-CM | POA: Diagnosis present

## 2023-11-17 DIAGNOSIS — I10 Essential (primary) hypertension: Secondary | ICD-10-CM | POA: Diagnosis present

## 2023-11-17 DIAGNOSIS — Z6834 Body mass index (BMI) 34.0-34.9, adult: Secondary | ICD-10-CM

## 2023-11-17 DIAGNOSIS — I2510 Atherosclerotic heart disease of native coronary artery without angina pectoris: Secondary | ICD-10-CM | POA: Diagnosis present

## 2023-11-17 DIAGNOSIS — N1831 Chronic kidney disease, stage 3a: Secondary | ICD-10-CM | POA: Diagnosis present

## 2023-11-17 DIAGNOSIS — C3490 Malignant neoplasm of unspecified part of unspecified bronchus or lung: Secondary | ICD-10-CM | POA: Diagnosis present

## 2023-11-17 DIAGNOSIS — C3491 Malignant neoplasm of unspecified part of right bronchus or lung: Secondary | ICD-10-CM

## 2023-11-17 DIAGNOSIS — R531 Weakness: Secondary | ICD-10-CM

## 2023-11-17 DIAGNOSIS — Z923 Personal history of irradiation: Secondary | ICD-10-CM

## 2023-11-17 DIAGNOSIS — I482 Chronic atrial fibrillation, unspecified: Secondary | ICD-10-CM | POA: Diagnosis not present

## 2023-11-17 DIAGNOSIS — E66812 Obesity, class 2: Secondary | ICD-10-CM | POA: Diagnosis present

## 2023-11-17 DIAGNOSIS — E876 Hypokalemia: Principal | ICD-10-CM | POA: Diagnosis present

## 2023-11-17 DIAGNOSIS — E785 Hyperlipidemia, unspecified: Secondary | ICD-10-CM | POA: Diagnosis present

## 2023-11-17 DIAGNOSIS — Z79890 Hormone replacement therapy (postmenopausal): Secondary | ICD-10-CM

## 2023-11-17 DIAGNOSIS — C3401 Malignant neoplasm of right main bronchus: Secondary | ICD-10-CM | POA: Diagnosis present

## 2023-11-17 DIAGNOSIS — K219 Gastro-esophageal reflux disease without esophagitis: Secondary | ICD-10-CM | POA: Diagnosis present

## 2023-11-17 DIAGNOSIS — R29898 Other symptoms and signs involving the musculoskeletal system: Secondary | ICD-10-CM

## 2023-11-17 DIAGNOSIS — I129 Hypertensive chronic kidney disease with stage 1 through stage 4 chronic kidney disease, or unspecified chronic kidney disease: Secondary | ICD-10-CM | POA: Diagnosis present

## 2023-11-17 DIAGNOSIS — Z8673 Personal history of transient ischemic attack (TIA), and cerebral infarction without residual deficits: Secondary | ICD-10-CM

## 2023-11-17 DIAGNOSIS — Z79899 Other long term (current) drug therapy: Secondary | ICD-10-CM

## 2023-11-17 DIAGNOSIS — Z833 Family history of diabetes mellitus: Secondary | ICD-10-CM

## 2023-11-17 DIAGNOSIS — G4730 Sleep apnea, unspecified: Secondary | ICD-10-CM | POA: Diagnosis present

## 2023-11-17 DIAGNOSIS — Z1152 Encounter for screening for COVID-19: Secondary | ICD-10-CM

## 2023-11-17 DIAGNOSIS — Z7901 Long term (current) use of anticoagulants: Secondary | ICD-10-CM

## 2023-11-17 DIAGNOSIS — E1122 Type 2 diabetes mellitus with diabetic chronic kidney disease: Secondary | ICD-10-CM | POA: Diagnosis present

## 2023-11-17 DIAGNOSIS — Z888 Allergy status to other drugs, medicaments and biological substances status: Secondary | ICD-10-CM

## 2023-11-17 DIAGNOSIS — D61810 Antineoplastic chemotherapy induced pancytopenia: Secondary | ICD-10-CM | POA: Diagnosis present

## 2023-11-17 LAB — CBC WITH DIFFERENTIAL (CANCER CENTER ONLY)
Basophils Absolute: 0 K/uL (ref 0.0–0.1)
Basophils Relative: 0 %
Eosinophils Absolute: 0 K/uL (ref 0.0–0.5)
Eosinophils Relative: 0 %
HCT: 22.5 % — ABNORMAL LOW (ref 36.0–46.0)
Hemoglobin: 7.9 g/dL — ABNORMAL LOW (ref 12.0–15.0)
Lymphocytes Relative: 17 %
Lymphs Abs: 0.4 K/uL — ABNORMAL LOW (ref 0.7–4.0)
MCH: 31.5 pg (ref 26.0–34.0)
MCHC: 35.1 g/dL (ref 30.0–36.0)
MCV: 89.6 fL (ref 80.0–100.0)
Monocytes Absolute: 0.1 K/uL (ref 0.1–1.0)
Monocytes Relative: 6 %
Neutro Abs: 1.7 K/uL (ref 1.7–7.7)
Neutrophils Relative %: 77 %
Platelet Count: 138 K/uL — ABNORMAL LOW (ref 150–400)
RBC: 2.51 MIL/uL — ABNORMAL LOW (ref 3.87–5.11)
RDW: 13.7 % (ref 11.5–15.5)
Smear Review: NORMAL
WBC Count: 2.2 K/uL — ABNORMAL LOW (ref 4.0–10.5)
nRBC: 0 % (ref 0.0–0.2)

## 2023-11-17 LAB — RESP PANEL BY RT-PCR (RSV, FLU A&B, COVID)  RVPGX2
Influenza A by PCR: NEGATIVE
Influenza B by PCR: NEGATIVE
Resp Syncytial Virus by PCR: NEGATIVE
SARS Coronavirus 2 by RT PCR: NEGATIVE

## 2023-11-17 LAB — CBC WITH DIFFERENTIAL/PLATELET
Abs Immature Granulocytes: 0.03 K/uL (ref 0.00–0.07)
Basophils Absolute: 0 K/uL (ref 0.0–0.1)
Basophils Relative: 0 %
Eosinophils Absolute: 0 K/uL (ref 0.0–0.5)
Eosinophils Relative: 1 %
HCT: 23 % — ABNORMAL LOW (ref 36.0–46.0)
Hemoglobin: 7.7 g/dL — ABNORMAL LOW (ref 12.0–15.0)
Immature Granulocytes: 1 %
Lymphocytes Relative: 19 %
Lymphs Abs: 0.4 K/uL — ABNORMAL LOW (ref 0.7–4.0)
MCH: 31.2 pg (ref 26.0–34.0)
MCHC: 33.5 g/dL (ref 30.0–36.0)
MCV: 93.1 fL (ref 80.0–100.0)
Monocytes Absolute: 0.3 K/uL (ref 0.1–1.0)
Monocytes Relative: 12 %
Neutro Abs: 1.4 K/uL — ABNORMAL LOW (ref 1.7–7.7)
Neutrophils Relative %: 67 %
Platelets: 134 K/uL — ABNORMAL LOW (ref 150–400)
RBC: 2.47 MIL/uL — ABNORMAL LOW (ref 3.87–5.11)
RDW: 13.8 % (ref 11.5–15.5)
WBC: 2.2 K/uL — ABNORMAL LOW (ref 4.0–10.5)
nRBC: 0 % (ref 0.0–0.2)

## 2023-11-17 LAB — SAMPLE TO BLOOD BANK

## 2023-11-17 LAB — MAGNESIUM: Magnesium: 1.1 mg/dL — ABNORMAL LOW (ref 1.7–2.4)

## 2023-11-17 LAB — CMP (CANCER CENTER ONLY)
ALT: 29 U/L (ref 0–44)
AST: 21 U/L (ref 15–41)
Albumin: 3.7 g/dL (ref 3.5–5.0)
Alkaline Phosphatase: 57 U/L (ref 38–126)
Anion gap: 8 (ref 5–15)
BUN: 12 mg/dL (ref 8–23)
CO2: 27 mmol/L (ref 22–32)
Calcium: 8.9 mg/dL (ref 8.9–10.3)
Chloride: 101 mmol/L (ref 98–111)
Creatinine: 1.23 mg/dL — ABNORMAL HIGH (ref 0.44–1.00)
GFR, Estimated: 50 mL/min — ABNORMAL LOW (ref >60)
Glucose, Bld: 121 mg/dL — ABNORMAL HIGH (ref 70–99)
Potassium: 2.9 mmol/L — ABNORMAL LOW (ref 3.5–5.1)
Sodium: 136 mmol/L (ref 135–145)
Total Bilirubin: 0.8 mg/dL (ref 0.0–1.2)
Total Protein: 6.3 g/dL — ABNORMAL LOW (ref 6.5–8.1)

## 2023-11-17 LAB — COMPREHENSIVE METABOLIC PANEL WITH GFR
ALT: 35 U/L (ref 0–44)
AST: 26 U/L (ref 15–41)
Albumin: 3.7 g/dL (ref 3.5–5.0)
Alkaline Phosphatase: 61 U/L (ref 38–126)
Anion gap: 14 (ref 5–15)
BUN: 10 mg/dL (ref 8–23)
CO2: 23 mmol/L (ref 22–32)
Calcium: 9.1 mg/dL (ref 8.9–10.3)
Chloride: 100 mmol/L (ref 98–111)
Creatinine, Ser: 1.18 mg/dL — ABNORMAL HIGH (ref 0.44–1.00)
GFR, Estimated: 52 mL/min — ABNORMAL LOW (ref >60)
Glucose, Bld: 118 mg/dL — ABNORMAL HIGH (ref 70–99)
Potassium: 2.7 mmol/L — CL (ref 3.5–5.1)
Sodium: 136 mmol/L (ref 135–145)
Total Bilirubin: 0.8 mg/dL (ref 0.0–1.2)
Total Protein: 6.2 g/dL — ABNORMAL LOW (ref 6.5–8.1)

## 2023-11-17 MED ORDER — PANTOPRAZOLE SODIUM 40 MG IV SOLR
40.0000 mg | Freq: Once | INTRAVENOUS | Status: AC
  Administered 2023-11-17: 40 mg via INTRAVENOUS
  Filled 2023-11-17: qty 10

## 2023-11-17 MED ORDER — DILTIAZEM HCL ER BEADS 240 MG PO CP24: 360.0000 mg | ORAL_CAPSULE | Freq: Once | ORAL | Status: DC

## 2023-11-17 MED ORDER — PIPERACILLIN-TAZOBACTAM 3.375 G IVPB 30 MIN
3.3750 g | Freq: Once | INTRAVENOUS | Status: AC
Start: 2023-11-17 — End: 2023-11-17
  Administered 2023-11-17: 3.375 g via INTRAVENOUS
  Filled 2023-11-17: qty 50

## 2023-11-17 MED ORDER — ROSUVASTATIN CALCIUM 20 MG PO TABS
20.0000 mg | ORAL_TABLET | Freq: Every day | ORAL | Status: DC
  Administered 2023-11-18 – 2023-11-22 (×5): 20 mg via ORAL
  Filled 2023-11-17 (×5): qty 1

## 2023-11-17 MED ORDER — PIPERACILLIN-TAZOBACTAM 3.375 G IVPB
3.3750 g | Freq: Three times a day (TID) | INTRAVENOUS | Status: DC
  Administered 2023-11-18 – 2023-11-22 (×13): 3.375 g via INTRAVENOUS
  Filled 2023-11-17 (×13): qty 50

## 2023-11-17 MED ORDER — LACTATED RINGERS IV SOLN: INTRAVENOUS | Status: AC

## 2023-11-17 MED ORDER — LACTATED RINGERS IV BOLUS
1000.0000 mL | Freq: Once | INTRAVENOUS | Status: AC
  Administered 2023-11-17: 1000 mL via INTRAVENOUS

## 2023-11-17 MED ORDER — FOLIC ACID 1 MG PO TABS
1.0000 mg | ORAL_TABLET | Freq: Every day | ORAL | Status: DC
  Administered 2023-11-18 – 2023-11-22 (×5): 1 mg via ORAL
  Filled 2023-11-17 (×5): qty 1

## 2023-11-17 MED ORDER — FAMOTIDINE IN NACL 20-0.9 MG/50ML-% IV SOLN
20.0000 mg | Freq: Once | INTRAVENOUS | Status: AC
  Administered 2023-11-17: 20 mg via INTRAVENOUS
  Filled 2023-11-17: qty 50

## 2023-11-17 MED ORDER — INSULIN ASPART 100 UNIT/ML IJ SOLN
0.0000 [IU] | Freq: Three times a day (TID) | INTRAMUSCULAR | Status: DC
  Administered 2023-11-21: 1 [IU] via SUBCUTANEOUS
  Filled 2023-11-17: qty 2

## 2023-11-17 MED ORDER — DILTIAZEM HCL ER COATED BEADS 120 MG PO CP24
360.0000 mg | ORAL_CAPSULE | Freq: Once | ORAL | Status: AC
  Administered 2023-11-17: 360 mg via ORAL
  Filled 2023-11-17: qty 3

## 2023-11-17 MED ORDER — MAGNESIUM SULFATE 2 GM/50ML IV SOLN
2.0000 g | Freq: Once | INTRAVENOUS | Status: AC
  Administered 2023-11-17: 2 g via INTRAVENOUS
  Filled 2023-11-17: qty 50

## 2023-11-17 MED ORDER — RIVAROXABAN 20 MG PO TABS
20.0000 mg | ORAL_TABLET | Freq: Every day | ORAL | Status: DC
  Administered 2023-11-17 – 2023-11-22 (×6): 20 mg via ORAL
  Filled 2023-11-17 (×6): qty 1

## 2023-11-17 MED ORDER — POTASSIUM CHLORIDE 10 MEQ/100ML IV SOLN
10.0000 meq | INTRAVENOUS | Status: AC
  Administered 2023-11-17 (×3): 10 meq via INTRAVENOUS
  Filled 2023-11-17 (×3): qty 100

## 2023-11-17 MED ORDER — LEVOTHYROXINE SODIUM 25 MCG PO TABS
25.0000 ug | ORAL_TABLET | Freq: Every day | ORAL | Status: DC
  Administered 2023-11-18 – 2023-11-22 (×5): 25 ug via ORAL
  Filled 2023-11-17 (×5): qty 1

## 2023-11-17 MED ORDER — ALUM & MAG HYDROXIDE-SIMETH 200-200-20 MG/5ML PO SUSP
15.0000 mL | Freq: Once | ORAL | Status: AC
  Administered 2023-11-17: 15 mL via ORAL
  Filled 2023-11-17: qty 30

## 2023-11-17 MED ORDER — SODIUM CHLORIDE 0.9 % IV SOLN: Freq: Once | INTRAVENOUS | Status: AC

## 2023-11-17 MED ORDER — ASPIRIN 81 MG PO TBEC
81.0000 mg | DELAYED_RELEASE_TABLET | Freq: Every day | ORAL | Status: DC
  Administered 2023-11-17 – 2023-11-22 (×6): 81 mg via ORAL
  Filled 2023-11-17 (×6): qty 1

## 2023-11-17 MED ORDER — ONDANSETRON HCL 4 MG/2ML IJ SOLN
4.0000 mg | Freq: Once | INTRAMUSCULAR | Status: AC
  Administered 2023-11-17: 4 mg via INTRAVENOUS
  Filled 2023-11-17: qty 2
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
## 2023-11-17 NOTE — ED Notes (Signed)
 Pt was assisted to the bathroom via wheelchair by family member

## 2023-11-17 NOTE — ED Notes (Signed)
 Patient transported to CT

## 2023-11-17 NOTE — ED Triage Notes (Signed)
 Pt arrives via wheelchair from the cancer center. Pt has Stage 4 NSC lung cancer. Electrolytes were noted to be abnormal today.   Last chemo 11/3 STANDARD PORT in place, and accessed.  Mag 1.1 K+ 2.9  Pt was given 1L NS and 20mg  Famotidine .  Oncologist is Dr. Gatha

## 2023-11-17 NOTE — Addendum Note (Signed)
 Addended by: LUDGER HEART E on: 11/17/2023 03:27 PM   Modules accepted: Orders

## 2023-11-17 NOTE — ED Provider Notes (Signed)
 [place] EMERGENCY DEPARTMENT AT [facility] Provider Note   CSN: [id number] Arrival date & time: 11/17/23  1617     Patient presents with: Abnormal Lab and Abdominal Pain   Becky [name] is a 63 y.o. female.   This is a 63 year old female with a history of non-small cell lung cancer who is here today for nausea, fatigue.  Patient had blood work done today which showed hypokalemia, hypomagnesia.  Patient last received chemotherapy on the third of this month.  She follows with Dr. Gatha.  She reports that over the last few days she has been increasingly fatigued, has at times had emesis, has not been able to keep much down.  She has felt increasingly weak, even having trouble rolling over in bed.   Abnormal Lab Abdominal Pain      Prior to Admission medications   Medication Sig Start Date End Date Taking? Authorizing Provider  acetaminophen  (TYLENOL ) 500 MG tablet Take 1,000 mg by mouth every 6 (six) hours as needed for mild pain (pain score 1-3).    [provider]  albuterol  (VENTOLIN  HFA) 108 (90 Base) MCG/ACT inhaler Inhale 2 puffs into the lungs every 4 (four) hours as needed for wheezing or shortness of breath. 12/07/21   Stuart Vernell Norris, PA-C  aspirin  EC 81 MG tablet Take 1 tablet (81 mg total) by mouth daily. Swallow whole. 07/25/23   Ricky Fines, MD  Azelastine  HCl 137 MCG/SPRAY SOLN Place 1 spray into both nostrils daily. 10/02/23   [provider]  dexlansoprazole  (DEXILANT ) 60 MG capsule Take 1 capsule (60 mg total) by mouth daily. 10/15/23 10/14/24  Cindie Carlin POUR, DO  diltiazem  (TIADYLT  ER) 360 MG 24 hr capsule TAKE ONE (1) CAPSULE BY MOUTH ONCE DAILY *NEW PRESCRIPTION REQUEST* 08/05/23   Alvan Dorn FALCON, MD  EPINEPHrine  0.3 mg/0.3 mL IJ SOAJ injection Inject 0.3 mg into the muscle once as needed for anaphylaxis. 04/20/18   [provider]  fluconazole  (DIFLUCAN ) 100 MG tablet Take 100 mg by mouth daily. Patient  not taking: Reported on 11/16/2023 10/06/23   [provider]  folic acid  (FOLVITE ) 1 MG tablet Take 1 tablet (1 mg total) by mouth daily. 06/18/23   Kandala, Hyndavi, MD  glimepiride  (AMARYL ) 2 MG tablet Take 1 tablet (2 mg total) by mouth daily with breakfast. 09/28/23   Gladis, Mary-Margaret, FNP  KLOR-CON  M20 20 MEQ tablet Take 1 tablet (20 mEq total) by mouth daily as needed. Pt takes as needed when she has cramps 09/28/23   [name], FNP  lidocaine -prilocaine  (EMLA ) cream APPLY TOPICALLY TO AFFECTED AREAS ONCE DAILY 09/14/23   Sherrod Sherrod, MD  linaclotide  (LINZESS ) 145 MCG CAPS capsule Take 1 capsule (145 mcg total) by mouth daily before breakfast. 10/15/23   Cindie Carlin POUR, DO  linaclotide  (LINZESS ) 72 MCG capsule Take 1 capsule (72 mcg total) by mouth daily before breakfast. 05/05/23   Shirlean Therisa ORN, NP  LORazepam  (ATIVAN ) 0.5 MG tablet Take 1 tablet (0.5 mg total) by mouth every 8 (eight) hours as needed for anxiety. Take 1 hour prior to radiation treatment 07/15/23   Shannon Agent, MD  magnesium  oxide (MAG-OX) 400 (240 Mg) MG tablet Take 1 tablet (400 mg total) by mouth 2 (two) times daily. 11/09/23 12/09/23  Heilingoetter, Cassandra L, PA-C  metoprolol  tartrate (LOPRESSOR ) 25 MG tablet Take 0.5 tablets (12.5 mg total) by mouth 2 (two) times daily. Patient not taking: Reported on 11/16/2023 09/28/23   [name], FNP  montelukast  (  SINGULAIR ) 10 MG tablet Take 10 mg by mouth at bedtime. 08/25/16   [provider]  neomycin-polymyxin b-dexamethasone  (MAXITROL) 3.5-10000-0.1 SUSP Place 4 drops into the right eye daily.    [provider]  NON FORMULARY Pt uses a cpap nightly    [provider]  olmesartan  (BENICAR ) 20 MG tablet Take 0.5 tablets (10 mg total) by mouth daily. 10/27/23   Miriam Norris, NP  ondansetron  (ZOFRAN ) 8 MG tablet Take 1 tablet (8 mg total) by mouth every 8 (eight) hours as needed for nausea or vomiting. 11/09/23    Heilingoetter, Cassandra L, PA-C  polyethylene glycol (MIRALAX ) 17 g packet Take 17 g by mouth daily. 04/10/23   Maree, Pratik D, DO  prochlorperazine  (COMPAZINE ) 10 MG tablet Take 1 tablet (10 mg total) by mouth every 6 (six) hours as needed. 11/09/23   Heilingoetter, Cassandra L, PA-C  RESTASIS  0.05 % ophthalmic emulsion Place 1 drop into both eyes daily as needed (dry eyes). 04/24/23   [provider]  rivaroxaban  (XARELTO ) 20 MG TABS tablet Take 1 tablet (20 mg total) by mouth daily. 08/24/23   Alvan Dorn FALCON, MD  rosuvastatin  (CRESTOR ) 20 MG tablet Take 1 tablet (20 mg total) by mouth daily. 09/28/23   Gladis, Mary-Margaret, FNP  SENEXON-S 8.6-50 MG tablet Take 1 tablet by mouth at bedtime. 08/14/23   [provider]  SYNTHROID  25 MCG tablet Take 1 tablet (25 mcg total) by mouth daily before breakfast. 09/28/23   [name], FNP    Allergies: Apresoline  [hydralazine ], Latex, Other, and Prednisone     Review of Systems  Gastrointestinal:  Positive for abdominal pain.    Updated Vital Signs BP 114/79   Pulse (!) 102   Temp 98.6 F (37 C) (Oral)   Resp (!) 26   SpO2 99%   Physical Exam Vitals and nursing note reviewed.  Constitutional:      Appearance: She is not toxic-appearing.  HENT:     Head: Normocephalic and atraumatic.  Cardiovascular:     Rate and Rhythm: Tachycardia present.  Pulmonary:     Effort: Pulmonary effort is normal.  Abdominal:     General: Abdomen is flat. There is no distension.     Palpations: Abdomen is soft.     Tenderness: There is no abdominal tenderness.  Skin:    General: Skin is warm and dry.     Findings: No rash.  Neurological:     Mental Status: She is alert.     (all labs ordered are listed, but only abnormal results are displayed) Labs Reviewed  CBC WITH DIFFERENTIAL/PLATELET - Abnormal; Notable for the following components:      Result Value   WBC 2.2 (*)    RBC 2.47 (*)    Hemoglobin 7.7 (*)    HCT 23.0  (*)    Platelets 134 (*)    Neutro Abs 1.4 (*)    Lymphs Abs 0.4 (*)    All other components within normal limits  COMPREHENSIVE METABOLIC PANEL WITH GFR - Abnormal; Notable for the following components:   Potassium 2.7 (*)    Glucose, Bld 118 (*)    Creatinine, Ser 1.18 (*)    Total Protein 6.2 (*)    GFR, Estimated 52 (*)    All other components within normal limits  RESP PANEL BY RT-PCR (RSV, FLU A&B, COVID)  [id number]    EKG: EKG Interpretation Date/Time:  Tuesday November 17 2023 17:01:21 EST Ventricular Rate:  114 PR Interval:  QRS Duration:  84 QT Interval:  443 QTC Calculation: 605 R Axis:   69  Text Interpretation: Atrial fibrillation Borderline low voltage, extremity leads Minimal ST depression, anterolateral leads Prolonged QT interval Baseline wander in lead(s) I III aVL V1 Confirmed by Mannie Pac [phone]) on 11/17/2023 6:28:27 PM  Radiology: CT ABDOMEN PELVIS WO CONTRAST Result Date: 11/17/2023 EXAM: CT ABDOMEN AND PELVIS WITHOUT CONTRAST 11/17/2023 07:45:00 PM TECHNIQUE: CT of the abdomen and pelvis was performed without the administration of intravenous contrast. Multiplanar reformatted images are provided for review. Automated exposure control, iterative reconstruction, and/or weight-based adjustment of the mA/kV was utilized to reduce the radiation dose to as low as reasonably achievable. COMPARISON: 09/18/2023 CLINICAL HISTORY: Bowel obstruction suspected. FINDINGS: LOWER CHEST: No acute abnormality. LIVER: 3.5 cm low density lesion inferiorly along the gallbladder fossa (image 20), possibly reflecting focal fat, but new from recent prior, metastasis not excluded. GALLBLADDER AND BILE DUCTS: Layering tiny gallstones, without associated inflammatory changes. No biliary ductal dilatation. SPLEEN: No acute abnormality. PANCREAS: No acute abnormality. ADRENAL GLANDS: 16 mm left adrenal nodule (image 24) unchanged from multiple priors, likely reflecting a benign adrenal  adenoma. KIDNEYS, URETERS AND BLADDER: No stones in the kidneys or ureters. No hydronephrosis. No perinephric or periureteral stranding. Urinary bladder is unremarkable. GI AND BOWEL: Stomach demonstrates no acute abnormality. Short segment wall thickening with pericolonic inflammatory changes along the proximal transverse colon (image 36), suggesting infectious/inflammatory colitis. Normal appendix (image 55). Sigmoid diverticulosis, without evidence of diverticulitis. There is no bowel obstruction. PERITONEUM AND RETROPERITONEUM: No ascites. No free air. VASCULATURE: Aorta is normal in caliber. Atherosclerotic calcifications of the abdominal aorta and branch vessels. LYMPH NODES: No lymphadenopathy. REPRODUCTIVE ORGANS: The uterus is within normal limits. BONES AND SOFT TISSUES: Mild degenerative changes of the lower thoracic spine. No acute osseous abnormality. No focal soft tissue abnormality. IMPRESSION: 1. Suspected infectious/inflammatory colitis involving the proximal transverse colon. 2. New 3.5 cm low-density lesion along the gallbladder fossa, possibly reflecting focal fat, although metastasis is not excluded. Consider follow-up MR abdomen with/without contrast in 4-6 weeks. Electronically signed by: Pinkie Pebbles MD 11/17/2023 07:52 PM EST RP Workstation: [id number]   DG Chest Port 1 View Result Date: 11/17/2023 CLINICAL DATA:  Cough. EXAM: PORTABLE CHEST 1 VIEW COMPARISON:  Chest radiograph dated 10/15/2023. FINDINGS: Port-A-Cath in similar position. Right suprahilar streaky density progressed since the prior radiograph. No new consolidation. No pleural effusion pneumothorax. Stable cardiac silhouette no acute osseous pathology. IMPRESSION: No acute cardiopulmonary process. Electronically Signed   By: Vanetta Chou M.D.   On: 11/17/2023 17:50     .Critical Care  Performed by: Mannie Pac DASEN, DO Authorized by: Mannie Pac DASEN, DO   Critical care provider statement:    Critical  care time (minutes):  33   Critical care was necessary to treat or prevent imminent or life-threatening deterioration of the following conditions:  Metabolic crisis   Critical care was time spent personally by me on the following activities:  Development of treatment plan with patient or surrogate, discussions with consultants, evaluation of patient's response to treatment, examination of patient, ordering and review of laboratory studies, ordering and review of radiographic studies, ordering and performing treatments and interventions, pulse oximetry, re-evaluation of patient's condition and review of old charts    Medications Ordered in the ED  potassium chloride  10 mEq in 100 mL IVPB (10 mEq Intravenous New Bag/Given 11/17/23 2022)  aspirin  EC tablet 81 mg (has no administration in time range)  diltiazem  (TIAZAC ) 24  hr capsule 360 mg (has no administration in time range)  magnesium  sulfate IVPB 2 g 50 mL (0 g Intravenous Stopped 11/17/23 1835)  ondansetron  (ZOFRAN ) injection 4 mg (4 mg Intravenous Given 11/17/23 1729)  lactated ringers  bolus 1,000 mL (0 mLs Intravenous Stopped 11/17/23 1927)  alum & mag hydroxide-simeth (MAALOX/MYLANTA) 200-200-20 MG/5ML suspension 15 mL (15 mLs Oral Given 11/17/23 1902)  pantoprazole  (PROTONIX ) injection 40 mg (40 mg Intravenous Given 11/17/23 1902)                                    Medical Decision Making 63 year old female with a history of non-small lung cancer, currently receiving chemotherapy here today for fatigue and malaise.  Differential diagnoses include dehydration, electrolyte abnormalities, current chemotherapy use, less likely sepsis.  Plan-on exam, patient mildly tachycardic, tachypneic, normotensive.  Will obtain a chest x-ray to assess for underlying pneumonia.  Will breath patient was on IV fluids, magnesium  and potassium.  Her abdomen is soft.  Family at bedside.  Reviewed the most recent oncology office note.  Reassessment 9  PM-patient's potassium 3.7, have been replacing, magnesium  is 1 earlier.  She is receiving magnesium  and potassium boluses.  Her CT imaging shows colitis..  She has been able to tolerate a bit of p.o., but is continuing to have some pain.  Discussed admission versus discharge home, patient prefers admission.  Family is at bedside.  Think this is reasonable given her significant electrolyte depletion which will require some time to improve, and I have concerns about the patient's ability to tolerate p.o. well enough at home.  Amount and/or Complexity of Data Reviewed Labs: ordered. Radiology: ordered.  Risk OTC drugs. Prescription drug management.        Final diagnoses:  Hypokalemia  Colitis    ED Discharge Orders     None          Mannie Fairy DASEN, DO 11/17/23 2104

## 2023-11-17 NOTE — Patient Instructions (Signed)

## 2023-11-17 NOTE — Progress Notes (Signed)
 Pt reported to [facility] today for IVFs. Pt had c/o weakness and indigestion. Pepcid  IV 20 mg administered. Per Cassie PA-C labs obtained. Hgb 7.9, Potassium 2.9, and Mag 1.1. Per Cassie PA-C if Pt agreeable transfer Pt to ED. This RN educated Pt on Cassie PA-C's recommendation. Pt and family agreeable with plan.   This RN and Mallie RIGGERS transferred Pt via w/c to ED. Report given to Emeline ORN RN.

## 2023-11-18 DIAGNOSIS — C7931 Secondary malignant neoplasm of brain: Secondary | ICD-10-CM | POA: Diagnosis present

## 2023-11-18 DIAGNOSIS — I482 Chronic atrial fibrillation, unspecified: Secondary | ICD-10-CM | POA: Diagnosis present

## 2023-11-18 DIAGNOSIS — D649 Anemia, unspecified: Secondary | ICD-10-CM

## 2023-11-18 DIAGNOSIS — Z79890 Hormone replacement therapy (postmenopausal): Secondary | ICD-10-CM | POA: Diagnosis not present

## 2023-11-18 DIAGNOSIS — Z923 Personal history of irradiation: Secondary | ICD-10-CM | POA: Diagnosis not present

## 2023-11-18 DIAGNOSIS — Z87891 Personal history of nicotine dependence: Secondary | ICD-10-CM | POA: Diagnosis not present

## 2023-11-18 DIAGNOSIS — I2510 Atherosclerotic heart disease of native coronary artery without angina pectoris: Secondary | ICD-10-CM | POA: Diagnosis present

## 2023-11-18 DIAGNOSIS — E1122 Type 2 diabetes mellitus with diabetic chronic kidney disease: Secondary | ICD-10-CM | POA: Diagnosis present

## 2023-11-18 DIAGNOSIS — E782 Mixed hyperlipidemia: Secondary | ICD-10-CM

## 2023-11-18 DIAGNOSIS — Z8249 Family history of ischemic heart disease and other diseases of the circulatory system: Secondary | ICD-10-CM | POA: Diagnosis not present

## 2023-11-18 DIAGNOSIS — C3401 Malignant neoplasm of right main bronchus: Secondary | ICD-10-CM | POA: Diagnosis present

## 2023-11-18 DIAGNOSIS — K219 Gastro-esophageal reflux disease without esophagitis: Secondary | ICD-10-CM | POA: Diagnosis present

## 2023-11-18 DIAGNOSIS — D61810 Antineoplastic chemotherapy induced pancytopenia: Secondary | ICD-10-CM | POA: Diagnosis present

## 2023-11-18 DIAGNOSIS — Z7982 Long term (current) use of aspirin: Secondary | ICD-10-CM | POA: Diagnosis not present

## 2023-11-18 DIAGNOSIS — N1831 Chronic kidney disease, stage 3a: Secondary | ICD-10-CM | POA: Diagnosis present

## 2023-11-18 DIAGNOSIS — Z1152 Encounter for screening for COVID-19: Secondary | ICD-10-CM | POA: Diagnosis not present

## 2023-11-18 DIAGNOSIS — D61818 Other pancytopenia: Secondary | ICD-10-CM | POA: Diagnosis not present

## 2023-11-18 DIAGNOSIS — E876 Hypokalemia: Secondary | ICD-10-CM | POA: Diagnosis present

## 2023-11-18 DIAGNOSIS — Z6834 Body mass index (BMI) 34.0-34.9, adult: Secondary | ICD-10-CM | POA: Diagnosis not present

## 2023-11-18 DIAGNOSIS — E66812 Obesity, class 2: Secondary | ICD-10-CM | POA: Diagnosis present

## 2023-11-18 DIAGNOSIS — C3491 Malignant neoplasm of unspecified part of right bronchus or lung: Secondary | ICD-10-CM | POA: Diagnosis not present

## 2023-11-18 DIAGNOSIS — E039 Hypothyroidism, unspecified: Secondary | ICD-10-CM | POA: Diagnosis present

## 2023-11-18 DIAGNOSIS — I480 Paroxysmal atrial fibrillation: Secondary | ICD-10-CM | POA: Diagnosis present

## 2023-11-18 DIAGNOSIS — Z7901 Long term (current) use of anticoagulants: Secondary | ICD-10-CM | POA: Diagnosis not present

## 2023-11-18 DIAGNOSIS — K529 Noninfective gastroenteritis and colitis, unspecified: Secondary | ICD-10-CM | POA: Diagnosis present

## 2023-11-18 DIAGNOSIS — I129 Hypertensive chronic kidney disease with stage 1 through stage 4 chronic kidney disease, or unspecified chronic kidney disease: Secondary | ICD-10-CM | POA: Diagnosis present

## 2023-11-18 DIAGNOSIS — E1169 Type 2 diabetes mellitus with other specified complication: Secondary | ICD-10-CM | POA: Diagnosis present

## 2023-11-18 DIAGNOSIS — Z7984 Long term (current) use of oral hypoglycemic drugs: Secondary | ICD-10-CM | POA: Diagnosis not present

## 2023-11-18 LAB — COMPREHENSIVE METABOLIC PANEL WITH GFR
ALT: 29 U/L (ref 0–44)
AST: 24 U/L (ref 15–41)
Albumin: 3.4 g/dL — ABNORMAL LOW (ref 3.5–5.0)
Alkaline Phosphatase: 58 U/L (ref 38–126)
Anion gap: 10 (ref 5–15)
BUN: 9 mg/dL (ref 8–23)
CO2: 25 mmol/L (ref 22–32)
Calcium: 9 mg/dL (ref 8.9–10.3)
Chloride: 102 mmol/L (ref 98–111)
Creatinine, Ser: 1.23 mg/dL — ABNORMAL HIGH (ref 0.44–1.00)
GFR, Estimated: 49 mL/min — ABNORMAL LOW (ref >60)
Glucose, Bld: 110 mg/dL — ABNORMAL HIGH (ref 70–99)
Potassium: 3 mmol/L — ABNORMAL LOW (ref 3.5–5.1)
Sodium: 137 mmol/L (ref 135–145)
Total Bilirubin: 0.7 mg/dL (ref 0.0–1.2)
Total Protein: 5.6 g/dL — ABNORMAL LOW (ref 6.5–8.1)

## 2023-11-18 LAB — CBC
HCT: 20.1 % — ABNORMAL LOW (ref 36.0–46.0)
Hemoglobin: 6.9 g/dL — CL (ref 12.0–15.0)
MCH: 31.9 pg (ref 26.0–34.0)
MCHC: 34.3 g/dL (ref 30.0–36.0)
MCV: 93.1 fL (ref 80.0–100.0)
Platelets: 117 K/uL — ABNORMAL LOW (ref 150–400)
RBC: 2.16 MIL/uL — ABNORMAL LOW (ref 3.87–5.11)
RDW: 13.8 % (ref 11.5–15.5)
WBC: 1.7 K/uL — ABNORMAL LOW (ref 4.0–10.5)
nRBC: 0 % (ref 0.0–0.2)

## 2023-11-18 LAB — GLUCOSE, CAPILLARY
Glucose-Capillary: 100 mg/dL — ABNORMAL HIGH (ref 70–99)
Glucose-Capillary: 138 mg/dL — ABNORMAL HIGH (ref 70–99)
Glucose-Capillary: 93 mg/dL (ref 70–99)
Glucose-Capillary: 130 mg/dL — ABNORMAL HIGH (ref 70–99)

## 2023-11-18 LAB — TSH: TSH: 1.99 u[IU]/mL (ref 0.350–4.500)

## 2023-11-18 LAB — ABO/RH: ABO/RH(D): O POS

## 2023-11-18 LAB — HEMOGLOBIN AND HEMATOCRIT, BLOOD
HCT: 24.2 % — ABNORMAL LOW (ref 36.0–46.0)
Hemoglobin: 8.1 g/dL — ABNORMAL LOW (ref 12.0–15.0)

## 2023-11-18 LAB — PREPARE RBC (CROSSMATCH)

## 2023-11-18 MED ORDER — CHLORHEXIDINE GLUCONATE CLOTH 2 % EX PADS
6.0000 | MEDICATED_PAD | Freq: Every day | CUTANEOUS | Status: DC
  Administered 2023-11-18 – 2023-11-21 (×3): 6 via TOPICAL

## 2023-11-18 MED ORDER — POTASSIUM CHLORIDE 20 MEQ PO PACK
40.0000 meq | PACK | ORAL | Status: AC
  Administered 2023-11-18 (×2): 40 meq via ORAL
  Filled 2023-11-18 (×2): qty 2

## 2023-11-18 MED ORDER — TRAMADOL HCL 50 MG PO TABS
50.0000 mg | ORAL_TABLET | Freq: Three times a day (TID) | ORAL | Status: DC | PRN
  Administered 2023-11-18 – 2023-11-19 (×2): 50 mg via ORAL
  Filled 2023-11-18 (×2): qty 1

## 2023-11-18 MED ORDER — ENSURE PLUS HIGH PROTEIN PO LIQD
237.0000 mL | Freq: Two times a day (BID) | ORAL | Status: DC
  Administered 2023-11-18 – 2023-11-22 (×8): 237 mL via ORAL

## 2023-11-18 MED ORDER — SODIUM CHLORIDE 0.9% IV SOLUTION: Freq: Once | INTRAVENOUS | Status: DC

## 2023-11-18 MED ORDER — SODIUM CHLORIDE 0.9% FLUSH: 10.0000 mL | INTRAVENOUS | Status: DC | PRN

## 2023-11-18 MED ORDER — SODIUM CHLORIDE 0.9% FLUSH
10.0000 mL | Freq: Two times a day (BID) | INTRAVENOUS | Status: DC
  Administered 2023-11-18 – 2023-11-20 (×5): 10 mL

## 2023-11-18 MED ORDER — FENTANYL CITRATE (PF) 50 MCG/ML IJ SOSY
25.0000 ug | PREFILLED_SYRINGE | INTRAMUSCULAR | Status: AC | PRN
Start: 2023-11-18 — End: 2023-11-20
  Administered 2023-11-18 – 2023-11-20 (×9): 25 ug via INTRAVENOUS
  Filled 2023-11-18 (×10): qty 1

## 2023-11-18 NOTE — Progress Notes (Signed)
   11/18/23 1148  GU Assessment  Genitourinary (WDL) X  Genitourinary Symptoms (S)  Pain with urination

## 2023-11-18 NOTE — Plan of Care (Signed)

## 2023-11-18 NOTE — Progress Notes (Signed)
 Patient stated she is not having diarrhea; unable to get stool sample. Patient reported BM early this morning and stated is was solid.

## 2023-11-18 NOTE — Telephone Encounter (Signed)
 Letter mailed to patient to call.

## 2023-11-18 NOTE — Progress Notes (Signed)
 Becky [name]   DOB:January 14, 1960   FM#:[id number]      ASSESSMENT & PLAN:  Becky [name] is a 63 year old female patient who came to hospital on 11/07/23 with complains of abdominal discomfort and nausea.  Oncologic history significant for NSCLC, and has been on chemo/immunotherapy.  Medical Oncology following.  Non-small cell lung adenocarcinoma, Stage IV (T2a, N2, M1c) with right hilar and mediastinal lymphadenopathy.   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  -- Diagnosed May 2025 -- Status post radiation therapy.  -- On palliative chemo/immunotherapy with Carbo/Alimta /Keytruda  every 3 weeks.  Due to multiple hospital admissions for fever and nausea, she chemo was dose-reduced.  Completed 3 cycles --Medical Oncology/Dr. Sherrod following and will make further recommendations.   Abdominal discomfort Nausea History of colitis/diverticulitis -- Concern raised for immune mediated colitis.  However at this time patient has no diarrhea and states she has been constipated.  -- Continue PPI and anti-emetics -- Continue supportive care   Anemia -- Hemoglobin low 6.9.   -- Recommend PRBC transfusion for hemoglobin <7.0.   -- Noted order for PRBC transfusion today.  Please repeat labs in am. -- Monitor CBC with differential closely  Thrombocytopenia -- Mild -- Platelets 117K -- Continue to monitor CBC with differential  Renal dysfunction -- Creatinine elevated 1.23 with GFR 49 today -- Avoid nephrotoxic agents -- Continue to monitor renal function    Code Status Full   Subjective:  Patient seen awake and alert laying in bed. States that she has had abdominal pain and increasing weakness.  Denies diarrhea, admits to constipation however had bowel movement earlier today.  No other acute complaints offered.  Objective:   Intake/Output Summary (Last 24 hours) at 11/18/2023 9071 Last data filed at 11/18/2023 0548 Gross per 24 hour  Intake 430.93 ml  Output --  Net  430.93 ml     PHYSICAL EXAMINATION: ECOG PERFORMANCE STATUS: 2 - Symptomatic, <50% confined to bed  Vitals:   11/18/23 0423 11/18/23 0748  BP: 106/61 118/64  Pulse: 88 93  Resp: 18 20  Temp: 98.7 F (37.1 C) 98.5 F (36.9 C)  SpO2: 100% 100%   Filed Weights   11/17/23 2342  Weight: 222 lb 14.2 oz (101.1 kg)    GENERAL: alert, no distress and comfortable SKIN: skin color, texture, turgor are normal, no rashes or significant lesions EYES: normal, conjunctiva are pink and non-injected, sclera clear OROPHARYNX: no exudate, no erythema and lips, buccal mucosa, and tongue normal  NECK: supple, thyroid  normal size, non-tender, without nodularity LYMPH: no palpable lymphadenopathy in the cervical, axillary or inguinal LUNGS: clear to auscultation and percussion with normal breathing effort HEART: regular rate & rhythm and no murmurs and no lower extremity edema ABDOMEN: abdomen soft, non-tender and normal bowel sounds MUSCULOSKELETAL: no cyanosis of digits and no clubbing  PSYCH: alert & oriented x 3 with fluent speech NEURO: no focal motor/sensory deficits   All questions were answered. The patient knows to call the clinic with any problems, questions or concerns.   The total time spent in the appointment was 40 minutes encounter with patient including review of chart and various tests results, discussions about plan of care and coordination of care plan  Olam JINNY Brunner, NP 11/18/2023 9:28 AM    Labs Reviewed:  Lab Results  Component Value Date   WBC 1.7 (L) 11/18/2023   HGB 6.9 (LL) 11/18/2023   HCT 20.1 (L) 11/18/2023   MCV 93.1 11/18/2023   PLT 117 (  L) 11/18/2023   Recent Labs    11/17/23 1422 11/17/23 1727 11/18/23 0115  NA 136 136 137  K 2.9* 2.7* 3.0*  CL 101 100 102  CO2 27 23 25   GLUCOSE 121* 118* 110*  BUN 12 10 9   CREATININE 1.23* 1.18* 1.23*  CALCIUM  8.9 9.1 9.0  GFRNONAA 50* 52* 49*  PROT 6.3* 6.2* 5.6*  ALBUMIN 3.7 3.7 3.4*  AST 21 26 24   ALT  29 35 29  ALKPHOS 57 61 58  BILITOT 0.8 0.8 0.7    Studies Reviewed:  CT ABDOMEN PELVIS WO CONTRAST Result Date: 11/17/2023 EXAM: CT ABDOMEN AND PELVIS WITHOUT CONTRAST 11/17/2023 07:45:00 PM TECHNIQUE: CT of the abdomen and pelvis was performed without the administration of intravenous contrast. Multiplanar reformatted images are provided for review. Automated exposure control, iterative reconstruction, and/or weight-based adjustment of the mA/kV was utilized to reduce the radiation dose to as low as reasonably achievable. COMPARISON: 09/18/2023 CLINICAL HISTORY: Bowel obstruction suspected. FINDINGS: LOWER CHEST: No acute abnormality. LIVER: 3.5 cm low density lesion inferiorly along the gallbladder fossa (image 20), possibly reflecting focal fat, but new from recent prior, metastasis not excluded. GALLBLADDER AND BILE DUCTS: Layering tiny gallstones, without associated inflammatory changes. No biliary ductal dilatation. SPLEEN: No acute abnormality. PANCREAS: No acute abnormality. ADRENAL GLANDS: 16 mm left adrenal nodule (image 24) unchanged from multiple priors, likely reflecting a benign adrenal adenoma. KIDNEYS, URETERS AND BLADDER: No stones in the kidneys or ureters. No hydronephrosis. No perinephric or periureteral stranding. Urinary bladder is unremarkable. GI AND BOWEL: Stomach demonstrates no acute abnormality. Short segment wall thickening with pericolonic inflammatory changes along the proximal transverse colon (image 36), suggesting infectious/inflammatory colitis. Normal appendix (image 55). Sigmoid diverticulosis, without evidence of diverticulitis. There is no bowel obstruction. PERITONEUM AND RETROPERITONEUM: No ascites. No free air. VASCULATURE: Aorta is normal in caliber. Atherosclerotic calcifications of the abdominal aorta and branch vessels. LYMPH NODES: No lymphadenopathy. REPRODUCTIVE ORGANS: The uterus is within normal limits. BONES AND SOFT TISSUES: Mild degenerative changes of  the lower thoracic spine. No acute osseous abnormality. No focal soft tissue abnormality. IMPRESSION: 1. Suspected infectious/inflammatory colitis involving the proximal transverse colon. 2. New 3.5 cm low-density lesion along the gallbladder fossa, possibly reflecting focal fat, although metastasis is not excluded. Consider follow-up MR abdomen with/without contrast in 4-6 weeks. Electronically signed by: Pinkie Pebbles MD 11/17/2023 07:52 PM EST RP Workstation: [id number]   DG Chest Port 1 View Result Date: 11/17/2023 CLINICAL DATA:  Cough. EXAM: PORTABLE CHEST 1 VIEW COMPARISON:  Chest radiograph dated 10/15/2023. FINDINGS: Port-A-Cath in similar position. Right suprahilar streaky density progressed since the prior radiograph. No new consolidation. No pleural effusion pneumothorax. Stable cardiac silhouette no acute osseous pathology. IMPRESSION: No acute cardiopulmonary process. Electronically Signed   By: Vanetta Chou M.D.   On: 11/17/2023 17:50

## 2023-11-18 NOTE — Hospital Course (Addendum)
 Becky [name] is a 64 y.o. 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, atrial fibrillation, hypertension, chronic anemia, hypothyroidism was referred to the ER by oncology office after patient's labs showed hypokalemia.  Patient states she has been having nausea for the last one month and over the last 3 days she has been having increasing epigastric discomfort.  Denies any diarrhea.  Her blood pressure medications were recently decreased by cardiology because of her blood pressure being in the low normal.   ED Course: In the ER patient's labs show potassium of 2.7 creatinine 1.1 hemoglobin 7.7 platelets 134 WBC 2.2.  Patient was given potassium replacement.  CT scan shows features concerning for proximal transverse colitis.  Empiric antibiotics started.  Gentle hydration.  Admitted for further observation.   Subjective:  Today patient continues to report having some symptoms of abdominal discomfort, has not had a bowel movement yet.  Does not report any fever.  Denies any vomiting.  Assessment and plan:  Abdominal pain with possible colitis Recent nausea and epigastric discomfort: CT abdomen shows suspected infectious/inflammatory colitis of proximal transverse colon, new 3.4 cm density GB fossa Not sure if colitis is secondary to immunotherapy. Not having diarrhea.  Consulted patient's oncologist.Cont empiric antibiotics.  If infection ruled out-oncology wants to consider systemic steroid therapy for colitis. GI pathogen panel, C. difficile ordered -pending however patient has not had any bowel movement. She had a hospitalization a couple of months ago for diverticulitis. Will try to consult GI.  Hypokalemia: Replacing, appears to be improving.  Atrial fibrillation with RVR: HR improved after a dose of Cardizem .  Cont Xarelto .TSH normal.  So far heart rate appears to be controlled.  Hypertension: Cont Cardizem .   meds recently decreased by  cardiology due to low normal blood pressure. Follow blood pressure trends.  Metastatic non-small lung cancer : being followed by oncologist.  On chemo and immunotherapy. CT scan does show some density in the gallbladder fossa could be metastatic-Oncology consulted.    Pancytopenia: likely related to chemotherapy.  Labs and transfusing prbc this am        Recent Labs  Lab 11/17/23 1727 11/18/23 0115 11/18/23 1752 11/19/23 0305  HGB 7.7* 6.9* 8.1* 7.7*  HCT 23.0* 20.1* 24.2* 22.6*  WBC 2.2* 1.7*  --  1.5*  PLT 134* 117*  --  100*    Hyperlipidemia: Cont statins.  Hypothyroidism  Cont Synthroid  TSH normal.  Sleep apnea Cont CPAP at bedtime.  Diabetes mellitus type 2: Not on any oral hypoglycemics since blood sugars on lower range recently. Last hemoglobin A1c in July 2025 was 6.  On sliding scale coverage.  Chronic kidney disease stage IIIa: creatinine around baseline. Monitor.  Class II Obesity w/ Body mass index is 34.91 kg/m.: Will benefit with PCP follow-up, weight loss,healthy lifestyle and outpatient sleep eval if not done.  Mobility: PT Orders:  PT Follow up Rec:    DVT prophylaxis:  Code Status:   Code Status: Full Code Family Communication: plan of care discussed with patient at bedside. Patient status is: Remains hospitalized because of severity of illness Level of care: Progressive   Dispo: The patient is from: home            Anticipated disposition: TBD Objective: Vitals last 24 hrs: Vitals:   11/18/23 1408 11/18/23 2055 11/19/23 0511 11/19/23 1353  BP: 107/62 (!) 114/97 120/73 108/86  Pulse: 79 82 98 70  Resp: 18 16 20  (!) 22  Temp: 98.1  F (36.7 C) 98.1 F (36.7 C) 98.4 F (36.9 C) (!) 97.4 F (36.3 C)  TempSrc: Oral Oral Oral Oral  SpO2: 100% 99% 99% 100%  Weight:      Height:       Physical Examination: General exam: alert awake, oriented, obese, laying in bed HEENT:Oral mucosa moist, Ear/Nose WNL grossly Respiratory system:  Bilaterally clear BS, Cardiovascular system: S1 & S2 +, No JVD. Gastrointestinal system: Abdomen soft, tenderness in the mid abdomen,ND, BS+ Nervous System: Alert, awake, moving all extremities,and following commands. Extremities: extremities warm, leg edema neg Skin: Warm, no rashes MSK: Normal muscle bulk,tone, power   Medications reviewed:  Scheduled Meds:  sodium chloride    Intravenous Once   aspirin  EC  81 mg Oral Daily   Chlorhexidine  Gluconate Cloth  6 each Topical Daily   feeding supplement  237 mL Oral BID BM   folic acid   1 mg Oral Daily   insulin  aspart  0-6 Units Subcutaneous TID WC   levothyroxine   25 mcg Oral Q0600   rivaroxaban   20 mg Oral Daily   rosuvastatin   20 mg Oral Daily   sodium chloride  flush  10-40 mL Intracatheter Q12H   Continuous Infusions:  piperacillin -tazobactam (ZOSYN )  IV 3.375 g (11/19/23 1306)   Diet: Diet Order             Diet heart healthy/carb modified Room service appropriate? Yes; Fluid consistency: Thin  Diet effective now

## 2023-11-18 NOTE — Progress Notes (Signed)
 Patient hemoglobin is 6.9 and potassium is 3.0. MD Franky notified and orders placed for potassium. Waiting on orders for transfusion.

## 2023-11-18 NOTE — Progress Notes (Signed)
 PROGRESS NOTE Becky [name]  FMW:[id number] DOB: 1960-04-13 DOA: 11/17/2023 PCP: Edman Meade PEDLAR, FNP  Brief Narrative/Hospital Course: Becky [name] is a 63 y.o. 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, atrial fibrillation, hypertension, chronic anemia, hypothyroidism was referred to the ER by oncology office after patient's labs showed hypokalemia.  Patient states she has been having nausea for the last one month and over the last 3 days she has been having increasing epigastric discomfort.  Denies any diarrhea.  Her blood pressure medications were recently decreased by cardiology because of her blood pressure being in the low normal.   ED Course: In the ER patient's labs show potassium of 2.7 creatinine 1.1 hemoglobin 7.7 platelets 134 WBC 2.2.  Patient was given potassium replacement.  CT scan shows features concerning for proximal transverse colitis.  Empiric antibiotics started.  Gentle hydration.  Admitted for further observation.   Subjective: Seen and examined today Had bowel movement this morning No chest pain nausea or vomiting. Overnight on room air, afebrile, VSS, Labs potassium 3.0 hemoglobin 6.91 unit PRBC ordered additional potassium chloride  ordered  Assessment and plan:  Abdominal pain with possible colitis Recent nausea and epigastric discomfort: CT abdomen shows suspected infectious/inflammatory colitis of proximal transverse colon, new 3.4 cm density GB fossa Not sure if colitis is secondary to immunotherapy. Not having diarrhea.  Consulted patient's oncologist.Cont empiric antibiotics.  If infection ruled out-oncology wants to consider systemic steroid therapy for colitis. GI pathogen panel, C. difficile ordered -pending  Hypokalemia: Replacing  Atrial fibrillation with RVR: HR improved after a dose of Cardizem .  Cont Xarelto .TSH normal.  Hypertension: Cont Cardizem .   meds recently decreased by cardiology  due to low normal blood pressure. Follow blood pressure trends.  Metastatic non-small lung cancer : being followed by oncologist.  On chemo and immunotherapy. CT scan does show some density in the gallbladder fossa could be metastatic-Oncology consulted.  Pancytopenia: likely related to chemotherapy.  Labs and transfusing prbc this am        Recent Labs  Lab 11/17/23 1422 11/17/23 1727 11/18/23 0115  HGB 7.9* 7.7* 6.9*  HCT 22.5* 23.0* 20.1*  WBC 2.2* 2.2* 1.7*  PLT 138* 134* 117*    Hyperlipidemia: Cont statins.  Hypothyroidism  Cont Synthroid  TSH normal.  Sleep apnea Cont CPAP at bedtime.  Diabetes mellitus type 2: Not on any oral hypoglycemics since blood sugars on lower range recently. Last hemoglobin A1c in July 2025 was 6.  On sliding scale coverage.  Chronic kidney disease stage IIIa: creatinine around baseline. Monitor.  Class II Obesity w/ Body mass index is 34.91 kg/m.: Will benefit with PCP follow-up, weight loss,healthy lifestyle and outpatient sleep eval if not done.  Mobility: PT Orders:  PT Follow up Rec:    DVT prophylaxis:  Code Status:   Code Status: Full Code Family Communication: plan of care discussed with patient at bedside. Patient status is: Remains hospitalized because of severity of illness Level of care: Progressive   Dispo: The patient is from: home            Anticipated disposition: TBD Objective: Vitals last 24 hrs: Vitals:   11/17/23 2341 11/17/23 2342 11/18/23 0423 11/18/23 0748  BP: (!) 109/56  106/61 118/64  Pulse: (!) 110 (!) 105 88 93  Resp: 18  18 20   Temp: 99.1 F (37.3 C)  98.7 F (37.1 C) 98.5 F (36.9 C)  TempSrc: Oral  Oral Oral  SpO2: 100% 100% 100% 100%  Weight:  101.1 kg    Height:  5' 7 (1.702 m)     Physical Examination: General exam: alert awake, oriented, older than stated age HEENT:Oral mucosa moist, Ear/Nose WNL grossly Respiratory system: Bilaterally clear BS,no use of accessory  muscle Cardiovascular system: S1 & S2 +, No JVD. Gastrointestinal system: Abdomen soft, tenderness in the mid abdomen,ND, BS+ Nervous System: Alert, awake, moving all extremities,and following commands. Extremities: extremities warm, leg edema neg Skin: Warm, no rashes MSK: Normal muscle bulk,tone, power   Medications reviewed:  Scheduled Meds:  sodium chloride    Intravenous Once   aspirin  EC  81 mg Oral Daily   Chlorhexidine  Gluconate Cloth  6 each Topical Daily   feeding supplement  237 mL Oral BID BM   folic acid   1 mg Oral Daily   insulin  aspart  0-6 Units Subcutaneous TID WC   levothyroxine   25 mcg Oral Q0600   potassium chloride   40 mEq Oral Q4H   rivaroxaban   20 mg Oral Daily   rosuvastatin   20 mg Oral Daily   sodium chloride  flush  10-40 mL Intracatheter Q12H   Continuous Infusions:  lactated ringers  Stopped (11/18/23 0530)   piperacillin -tazobactam (ZOSYN )  IV 12.5 mL/hr at 11/18/23 0548   Diet: Diet Order             Diet heart healthy/carb modified Room service appropriate? Yes; Fluid consistency: Thin  Diet effective now                    Data Reviewed: I have personally reviewed following labs and imaging studies ( see epic result tab) CBC: Recent Labs  Lab 11/17/23 1422 11/17/23 1727 11/18/23 0115  WBC 2.2* 2.2* 1.7*  NEUTROABS 1.7 1.4*  --   HGB 7.9* 7.7* 6.9*  HCT 22.5* 23.0* 20.1*  MCV 89.6 93.1 93.1  PLT 138* 134* 117*   CMP: Recent Labs  Lab 11/17/23 1422 11/17/23 1727 11/18/23 0115  NA 136 136 137  K 2.9* 2.7* 3.0*  CL 101 100 102  CO2 27 23 25   GLUCOSE 121* 118* 110*  BUN 12 10 9   CREATININE 1.23* 1.18* 1.23*  CALCIUM  8.9 9.1 9.0  MG 1.1*  --   --    GFR: Estimated Creatinine Clearance: 57.9 mL/min (A) (by C-G formula based on SCr of 1.23 mg/dL (H)). Recent Labs  Lab 11/17/23 1422 11/17/23 1727 11/18/23 0115  AST 21 26 24   ALT 29 35 29  ALKPHOS 57 61 58  BILITOT 0.8 0.8 0.7  PROT 6.3* 6.2* 5.6*  ALBUMIN 3.7 3.7 3.4*    No results for input(s): LIPASE, AMYLASE in the last 168 hours. No results for input(s): AMMONIA in the last 168 hours. Coagulation Profile: No results for input(s): INR, PROTIME in the last 168 hours. Unresulted Labs (From admission, onward)     Start     Ordered   11/19/23 0500  Basic metabolic panel with GFR  Daily,   R     Question:  Specimen collection method  Answer:  IV Team=IV Team collect   11/18/23 0919   11/19/23 0500  CBC  Daily,   R     Question:  Specimen collection method  Answer:  IV Team=IV Team collect   11/18/23 0919   11/18/23 0949  Gastrointestinal Panel by PCR , Stool  (Gastrointestinal Panel by PCR, Stool                                                                                                                                                     **  Does Not include CLOSTRIDIUM DIFFICILE testing. **If CDIFF testing is needed, place order from the C Difficile Testing order set.**)  Once,   R        11/18/23 0948   11/18/23 0949  C Difficile Quick Screen w PCR reflex  (C Difficile quick screen w PCR reflex panel )  Once, for 24 hours,   TIMED       References:    CDiff Information Tool   11/18/23 0948           Antimicrobials/Microbiology: Anti-infectives (From admission, onward)    Start     Dose/Rate Route Frequency Ordered Stop   11/18/23 0600  piperacillin -tazobactam (ZOSYN ) IVPB 3.375 g        3.375 g 12.5 mL/hr over 240 Minutes Intravenous Every 8 hours 11/17/23 2320     11/17/23 2245  piperacillin -tazobactam (ZOSYN ) IVPB 3.375 g        3.375 g 100 mL/hr over 30 Minutes Intravenous  Once 11/17/23 2239 11/17/23 2329         Component Value Date/Time   SDES BLOOD BLOOD RIGHT HAND 10/14/2023 2156   SPECREQUEST  10/14/2023 2156    BOTTLES DRAWN AEROBIC AND ANAEROBIC Blood Culture adequate volume   CULT  10/14/2023 2156    NO GROWTH 5 DAYS Performed at [facility], [address], [place], [place] [address]    REPTSTATUS 10/19/2023  FINAL 10/14/2023 2156    Procedures:   Mennie LAMY, MD [facility] 11/18/2023, 11:18 AM

## 2023-11-18 NOTE — Progress Notes (Signed)
   11/18/23 1815  Implanted Port 07/06/23 Left Chest Single Power  Placement Date/Time: 07/06/23 1410   Serial / Lot #: [id number]  Expiration Date: 05/05/24  Time Out: Correct patient;Correct site;Correct procedure  Maximum sterile barrier precautions: Hand hygiene;Cap;Mask;Sterile gown;Sterile gloves;Large sterile sh...  Dressing Intervention (S)  Dressing changed (patient had allergic reaction to PORT dressing tape; had to do new dressing with tegaderm and paper tape.)  Needle Change Due 11/25/23

## 2023-11-19 ENCOUNTER — Inpatient Hospital Stay

## 2023-11-19 DIAGNOSIS — K529 Noninfective gastroenteritis and colitis, unspecified: Secondary | ICD-10-CM | POA: Diagnosis not present

## 2023-11-19 LAB — TYPE AND SCREEN
ABO/RH(D): O POS
Antibody Screen: NEGATIVE
Unit division: 0

## 2023-11-19 LAB — BASIC METABOLIC PANEL WITH GFR
Anion gap: 11 (ref 5–15)
BUN: 8 mg/dL (ref 8–23)
CO2: 25 mmol/L (ref 22–32)
Calcium: 9.4 mg/dL (ref 8.9–10.3)
Chloride: 102 mmol/L (ref 98–111)
Creatinine, Ser: 1.22 mg/dL — ABNORMAL HIGH (ref 0.44–1.00)
GFR, Estimated: 50 mL/min — ABNORMAL LOW (ref >60)
Glucose, Bld: 117 mg/dL — ABNORMAL HIGH (ref 70–99)
Potassium: 3.4 mmol/L — ABNORMAL LOW (ref 3.5–5.1)
Sodium: 138 mmol/L (ref 135–145)

## 2023-11-19 LAB — CBC
HCT: 22.6 % — ABNORMAL LOW (ref 36.0–46.0)
Hemoglobin: 7.7 g/dL — ABNORMAL LOW (ref 12.0–15.0)
MCH: 31.4 pg (ref 26.0–34.0)
MCHC: 34.1 g/dL (ref 30.0–36.0)
MCV: 92.2 fL (ref 80.0–100.0)
Platelets: 100 K/uL — ABNORMAL LOW (ref 150–400)
RBC: 2.45 MIL/uL — ABNORMAL LOW (ref 3.87–5.11)
RDW: 14.1 % (ref 11.5–15.5)
WBC: 1.5 K/uL — ABNORMAL LOW (ref 4.0–10.5)
nRBC: 0 % (ref 0.0–0.2)

## 2023-11-19 LAB — BPAM RBC
Blood Product Expiration Date: 202512122359
ISSUE DATE / TIME: 202511121133
Unit Type and Rh: 5100

## 2023-11-19 LAB — GLUCOSE, CAPILLARY
Glucose-Capillary: 107 mg/dL — ABNORMAL HIGH (ref 70–99)
Glucose-Capillary: 131 mg/dL — ABNORMAL HIGH (ref 70–99)
Glucose-Capillary: 129 mg/dL — ABNORMAL HIGH (ref 70–99)
Glucose-Capillary: 128 mg/dL — ABNORMAL HIGH (ref 70–99)

## 2023-11-19 MED ORDER — ONDANSETRON HCL 4 MG/2ML IJ SOLN
4.0000 mg | Freq: Four times a day (QID) | INTRAMUSCULAR | Status: DC | PRN
  Administered 2023-11-19 – 2023-11-22 (×8): 4 mg via INTRAVENOUS
  Filled 2023-11-19 (×9): qty 2

## 2023-11-19 MED ORDER — HYDROCODONE-ACETAMINOPHEN 5-325 MG PO TABS
1.0000 | ORAL_TABLET | Freq: Four times a day (QID) | ORAL | Status: DC | PRN
  Administered 2023-11-19 – 2023-11-21 (×5): 1 via ORAL
  Filled 2023-11-19 (×6): qty 1

## 2023-11-19 MED ORDER — DILTIAZEM HCL ER COATED BEADS 180 MG PO CP24
360.0000 mg | ORAL_CAPSULE | Freq: Every day | ORAL | Status: DC
  Administered 2023-11-20 – 2023-11-21 (×3): 360 mg via ORAL
  Filled 2023-11-19 (×3): qty 2

## 2023-11-19 MED ORDER — POTASSIUM CHLORIDE 20 MEQ PO PACK
40.0000 meq | PACK | Freq: Once | ORAL | Status: AC
  Administered 2023-11-19: 40 meq via ORAL
  Filled 2023-11-19: qty 2

## 2023-11-19 NOTE — Progress Notes (Signed)
 PROGRESS NOTE Becky [name]  FMW:[id number] DOB: 1960/12/28 DOA: 11/17/2023 PCP: Edman Meade PEDLAR, FNP  Brief Narrative/Hospital Course: Becky [name] is a 63 y.o. 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, atrial fibrillation, hypertension, chronic anemia, hypothyroidism was referred to the ER by oncology office after patient's labs showed hypokalemia.  Patient states she has been having nausea for the last one month and over the last 3 days she has been having increasing epigastric discomfort.  Denies any diarrhea.  Her blood pressure medications were recently decreased by cardiology because of her blood pressure being in the low normal.   ED Course: In the ER patient's labs show potassium of 2.7 creatinine 1.1 hemoglobin 7.7 platelets 134 WBC 2.2.  Patient was given potassium replacement.  CT scan shows features concerning for proximal transverse colitis.  Empiric antibiotics started.  Gentle hydration.  Admitted for further observation.   Subjective:  Today patient continues to report having some symptoms of abdominal discomfort, has not had a bowel movement yet.  Does not report any fever.  Denies any vomiting.  Assessment and plan:  Abdominal pain with possible colitis Recent nausea and epigastric discomfort: CT abdomen shows suspected infectious/inflammatory colitis of proximal transverse colon, new 3.4 cm density GB fossa Not sure if colitis is secondary to immunotherapy. Not having diarrhea.  Consulted patient's oncologist.Cont empiric antibiotics.  If infection ruled out-oncology wants to consider systemic steroid therapy for colitis. GI pathogen panel, C. difficile ordered -pending however patient has not had any bowel movement. She had a hospitalization a couple of months ago for diverticulitis. Will try to consult GI.  Hypokalemia: Replacing, appears to be improving.  Atrial fibrillation with RVR: HR improved after a dose  of Cardizem .  Cont Xarelto .TSH normal.  So far heart rate appears to be controlled.  Hypertension: Cont Cardizem .   meds recently decreased by cardiology due to low normal blood pressure. Follow blood pressure trends.  Metastatic non-small lung cancer : being followed by oncologist.  On chemo and immunotherapy. CT scan does show some density in the gallbladder fossa could be metastatic-Oncology consulted.    Pancytopenia: likely related to chemotherapy.  Labs and transfusing prbc this am        Recent Labs  Lab 11/17/23 1727 11/18/23 0115 11/18/23 1752 11/19/23 0305  HGB 7.7* 6.9* 8.1* 7.7*  HCT 23.0* 20.1* 24.2* 22.6*  WBC 2.2* 1.7*  --  1.5*  PLT 134* 117*  --  100*    Hyperlipidemia: Cont statins.  Hypothyroidism  Cont Synthroid  TSH normal.  Sleep apnea Cont CPAP at bedtime.  Diabetes mellitus type 2: Not on any oral hypoglycemics since blood sugars on lower range recently. Last hemoglobin A1c in July 2025 was 6.  On sliding scale coverage.  Chronic kidney disease stage IIIa: creatinine around baseline. Monitor.  Class II Obesity w/ Body mass index is 34.91 kg/m.: Will benefit with PCP follow-up, weight loss,healthy lifestyle and outpatient sleep eval if not done.  Mobility: PT Orders:  PT Follow up Rec:    DVT prophylaxis:  Code Status:   Code Status: Full Code Family Communication: plan of care discussed with patient at bedside. Patient status is: Remains hospitalized because of severity of illness Level of care: Progressive   Dispo: The patient is from: home            Anticipated disposition: TBD Objective: Vitals last 24 hrs: Vitals:   11/18/23 1408 11/18/23 2055 11/19/23 0511 11/19/23 1353  BP: 107/62 ROLLEN)  114/97 120/73 108/86  Pulse: 79 82 98 70  Resp: 18 16 20  (!) 22  Temp: 98.1 F (36.7 C) 98.1 F (36.7 C) 98.4 F (36.9 C) (!) 97.4 F (36.3 C)  TempSrc: Oral Oral Oral Oral  SpO2: 100% 99% 99% 100%  Weight:      Height:       Physical  Examination: General exam: alert awake, oriented, obese, laying in bed HEENT:Oral mucosa moist, Ear/Nose WNL grossly Respiratory system: Bilaterally clear BS, Cardiovascular system: S1 & S2 +, No JVD. Gastrointestinal system: Abdomen soft, tenderness in the mid abdomen,ND, BS+ Nervous System: Alert, awake, moving all extremities,and following commands. Extremities: extremities warm, leg edema neg Skin: Warm, no rashes MSK: Normal muscle bulk,tone, power   Medications reviewed:  Scheduled Meds:  sodium chloride    Intravenous Once   aspirin  EC  81 mg Oral Daily   Chlorhexidine  Gluconate Cloth  6 each Topical Daily   feeding supplement  237 mL Oral BID BM   folic acid   1 mg Oral Daily   insulin  aspart  0-6 Units Subcutaneous TID WC   levothyroxine   25 mcg Oral Q0600   rivaroxaban   20 mg Oral Daily   rosuvastatin   20 mg Oral Daily   sodium chloride  flush  10-40 mL Intracatheter Q12H   Continuous Infusions:  piperacillin -tazobactam (ZOSYN )  IV 3.375 g (11/19/23 1306)   Diet: Diet Order             Diet heart healthy/carb modified Room service appropriate? Yes; Fluid consistency: Thin  Diet effective now                    Data Reviewed: I have personally reviewed following labs and imaging studies ( see epic result tab) CBC: Recent Labs  Lab 11/17/23 1422 11/17/23 1727 11/18/23 0115 11/18/23 1752 11/19/23 0305  WBC 2.2* 2.2* 1.7*  --  1.5*  NEUTROABS 1.7 1.4*  --   --   --   HGB 7.9* 7.7* 6.9* 8.1* 7.7*  HCT 22.5* 23.0* 20.1* 24.2* 22.6*  MCV 89.6 93.1 93.1  --  92.2  PLT 138* 134* 117*  --  100*   CMP: Recent Labs  Lab 11/17/23 1422 11/17/23 1727 11/18/23 0115 11/19/23 0305  NA 136 136 137 138  K 2.9* 2.7* 3.0* 3.4*  CL 101 100 102 102  CO2 27 23 25 25   GLUCOSE 121* 118* 110* 117*  BUN 12 10 9 8   CREATININE 1.23* 1.18* 1.23* 1.22*  CALCIUM  8.9 9.1 9.0 9.4  MG 1.1*  --   --   --    GFR: Estimated Creatinine Clearance: 58.4 mL/min (A) (by C-G formula  based on SCr of 1.22 mg/dL (H)). Recent Labs  Lab 11/17/23 1422 11/17/23 1727 11/18/23 0115  AST 21 26 24   ALT 29 35 29  ALKPHOS 57 61 58  BILITOT 0.8 0.8 0.7  PROT 6.3* 6.2* 5.6*  ALBUMIN 3.7 3.7 3.4*   No results for input(s): LIPASE, AMYLASE in the last 168 hours. No results for input(s): AMMONIA in the last 168 hours. Coagulation Profile: No results for input(s): INR, PROTIME in the last 168 hours. Unresulted Labs (From admission, onward)     Start     Ordered   11/19/23 0500  Basic metabolic panel with GFR  Daily,   R     Question:  Specimen collection method  Answer:  IV Team=IV Team collect   11/18/23 0919   11/19/23 0500  CBC  Daily,  R     Question:  Specimen collection method  Answer:  IV Team=IV Team collect   11/18/23 0919   11/18/23 0949  Gastrointestinal Panel by PCR , Stool  (Gastrointestinal Panel by PCR, Stool                                                                                                                                                     **Does Not include CLOSTRIDIUM DIFFICILE testing. **If CDIFF testing is needed, place order from the C Difficile Testing order set.**)  Once,   R        11/18/23 0948   11/18/23 0949  C Difficile Quick Screen w PCR reflex  (C Difficile quick screen w PCR reflex panel )  Once, for 24 hours,   TIMED       References:    CDiff Information Tool   11/18/23 0948           Antimicrobials/Microbiology: Anti-infectives (From admission, onward)    Start     Dose/Rate Route Frequency Ordered Stop   11/18/23 0600  piperacillin -tazobactam (ZOSYN ) IVPB 3.375 g        3.375 g 12.5 mL/hr over 240 Minutes Intravenous Every 8 hours 11/17/23 2320     11/17/23 2245  piperacillin -tazobactam (ZOSYN ) IVPB 3.375 g        3.375 g 100 mL/hr over 30 Minutes Intravenous  Once 11/17/23 2239 11/17/23 2329         Component Value Date/Time   SDES BLOOD BLOOD RIGHT HAND 10/14/2023 2156   SPECREQUEST  10/14/2023 2156     BOTTLES DRAWN AEROBIC AND ANAEROBIC Blood Culture adequate volume   CULT  10/14/2023 2156    NO GROWTH 5 DAYS Performed at [facility], [address] [place], [place] [address]    REPTSTATUS 10/19/2023 FINAL 10/14/2023 2156    Procedures:   MARGARETMARY DELENA HALEY, MD [facility] 11/19/2023, 4:51 PM

## 2023-11-19 NOTE — Progress Notes (Signed)
 Mobility Specialist - Progress Note  During mobility: 132 bpm HR,  Post-mobility: 115 bpm HR,    11/19/23 1311  Mobility  Activity Ambulated with assistance  Level of Assistance Standby assist, set-up cues, supervision of patient - no hands on  Assistive Device Front wheel walker  Distance Ambulated (ft) 10 ft  Range of Motion/Exercises Active  Activity Response Tolerated fair  Mobility visit 1 Mobility  Mobility Specialist Start Time (ACUTE ONLY) 1300  Mobility Specialist Stop Time (ACUTE ONLY) 1311  Mobility Specialist Time Calculation (min) (ACUTE ONLY) 11 min   Pt was found in bathroom and assisted to recliner chair. Deffered further ambulation due to increased HR of 130s. At EOS was left with all needs met. Call bell in reach. RN notified.   Erminio Leos,  Mobility Specialist Can be reached via Secure Chat

## 2023-11-19 NOTE — Progress Notes (Signed)
 Chaplains received a consult to assist Becky [name] with creating advance directives for her medical care.  I brought her the paperwork and she asked if we could review it tomorrow.  I will attempt follow up tomorrow, but please also page as needs arise.

## 2023-11-20 ENCOUNTER — Telehealth: Payer: Self-pay

## 2023-11-20 ENCOUNTER — Telehealth (HOSPITAL_COMMUNITY): Payer: Self-pay

## 2023-11-20 ENCOUNTER — Encounter (HOSPITAL_COMMUNITY)

## 2023-11-20 DIAGNOSIS — K529 Noninfective gastroenteritis and colitis, unspecified: Secondary | ICD-10-CM | POA: Diagnosis not present

## 2023-11-20 LAB — BASIC METABOLIC PANEL WITH GFR
Anion gap: 11 (ref 5–15)
BUN: 9 mg/dL (ref 8–23)
CO2: 27 mmol/L (ref 22–32)
Calcium: 9.5 mg/dL (ref 8.9–10.3)
Chloride: 100 mmol/L (ref 98–111)
Creatinine, Ser: 1.2 mg/dL — ABNORMAL HIGH (ref 0.44–1.00)
GFR, Estimated: 51 mL/min — ABNORMAL LOW (ref >60)
Glucose, Bld: 104 mg/dL — ABNORMAL HIGH (ref 70–99)
Potassium: 3.3 mmol/L — ABNORMAL LOW (ref 3.5–5.1)
Sodium: 137 mmol/L (ref 135–145)

## 2023-11-20 LAB — CBC
HCT: 23.6 % — ABNORMAL LOW (ref 36.0–46.0)
Hemoglobin: 8 g/dL — ABNORMAL LOW (ref 12.0–15.0)
MCH: 31 pg (ref 26.0–34.0)
MCHC: 33.9 g/dL (ref 30.0–36.0)
MCV: 91.5 fL (ref 80.0–100.0)
Platelets: 79 K/uL — ABNORMAL LOW (ref 150–400)
RBC: 2.58 MIL/uL — ABNORMAL LOW (ref 3.87–5.11)
RDW: 13.8 % (ref 11.5–15.5)
WBC: 1.8 K/uL — ABNORMAL LOW (ref 4.0–10.5)
nRBC: 0 % (ref 0.0–0.2)

## 2023-11-20 LAB — GLUCOSE, CAPILLARY
Glucose-Capillary: 105 mg/dL — ABNORMAL HIGH (ref 70–99)
Glucose-Capillary: 135 mg/dL — ABNORMAL HIGH (ref 70–99)
Glucose-Capillary: 134 mg/dL — ABNORMAL HIGH (ref 70–99)
Glucose-Capillary: 124 mg/dL — ABNORMAL HIGH (ref 70–99)

## 2023-11-20 MED ORDER — FENTANYL CITRATE (PF) 50 MCG/ML IJ SOSY: 12.5000 ug | PREFILLED_SYRINGE | Freq: Once | INTRAMUSCULAR | Status: AC | PRN

## 2023-11-20 MED ORDER — ALUM & MAG HYDROXIDE-SIMETH 200-200-20 MG/5ML PO SUSP
30.0000 mL | ORAL | Status: DC | PRN
  Administered 2023-11-20 – 2023-11-21 (×2): 30 mL via ORAL
  Filled 2023-11-20 (×2): qty 30

## 2023-11-20 MED ORDER — POTASSIUM CHLORIDE CRYS ER 20 MEQ PO TBCR
40.0000 meq | EXTENDED_RELEASE_TABLET | Freq: Every day | ORAL | Status: AC
  Administered 2023-11-20: 40 meq via ORAL
  Filled 2023-11-20: qty 2

## 2023-11-20 NOTE — Progress Notes (Signed)
 PROGRESS NOTE Becky [name]  FMW:[id number] DOB: [birth year] DOA: 11/17/2023 PCP: Edman Meade PEDLAR, FNP  Brief Narrative/Hospital Course: Becky [name] is a 63 y.o. 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, atrial fibrillation, hypertension, chronic anemia, hypothyroidism was referred to the ER by oncology office after patient's labs showed hypokalemia.  Patient states she has been having nausea for the last one month and over the last 3 days she has been having increasing epigastric discomfort.  Denies any diarrhea.  Her blood pressure medications were recently decreased by cardiology because of her blood pressure being in the low normal.   ED Course: In the ER patient's labs show potassium of 2.7 creatinine 1.1 hemoglobin 7.7 platelets 134 WBC 2.2.  Patient was given potassium replacement.  CT scan shows features concerning for proximal transverse colitis.  Empiric antibiotics started.  Gentle hydration.  Admitted for further observation.   Subjective:  Today patient continues to report having some symptoms of abdominal discomfort She feels better compared to time of admission, she tells me she will like to be discharged tomorrow as she is not feel comfortable enough going home yet. Assessment and plan:  Abdominal pain with possible colitis Recent nausea and epigastric discomfort: CT abdomen shows suspected infectious/inflammatory colitis of proximal transverse colon, new 3.4 cm density GB fossa Not sure if colitis is secondary to immunotherapy. Not having diarrhea.  Consulted patient's oncologist.Cont empiric antibiotics.  If infection ruled out-oncology wants to consider systemic steroid therapy for colitis. GI pathogen panel, C. difficile ordered -cancelled stools were formed She had a hospitalization a couple of months ago for diverticulitis. Dr Saintclair  recommends 5 day course of Augmentin  BID  at DC    Hypokalemia: Replacing,  appears to be improving.  Atrial fibrillation with RVR: HR improved after a dose of Cardizem .  Cont Xarelto .TSH normal.  So far heart rate appears to be controlled.  Hypertension: Cont Cardizem .   meds recently decreased by cardiology due to low normal blood pressure. Follow blood pressure trends.  Metastatic non-small lung cancer : being followed by oncologist.  On chemo and immunotherapy. CT scan does show some density in the gallbladder fossa could be metastatic-Oncology consulted.    Pancytopenia: likely related to chemotherapy.  Labs and transfusing prbc this am        Recent Labs  Lab 11/17/23 1727 11/18/23 0115 11/18/23 1752 11/19/23 0305  HGB 7.7* 6.9* 8.1* 7.7*  HCT 23.0* 20.1* 24.2* 22.6*  WBC 2.2* 1.7*  --  1.5*  PLT 134* 117*  --  100*    Hyperlipidemia: Cont statins.  Hypothyroidism  Cont Synthroid  TSH normal.  Sleep apnea Cont CPAP at bedtime.  Diabetes mellitus type 2: Not on any oral hypoglycemics since blood sugars on lower range recently. Last hemoglobin A1c in July 2025 was 6.  On sliding scale coverage.  Chronic kidney disease stage IIIa: creatinine around baseline. Monitor.  Class II Obesity w/ Body mass index is 34.91 kg/m.: Will benefit with PCP follow-up, weight loss,healthy lifestyle and outpatient sleep eval if not done.  Mobility: PT Orders:  PT Follow up Rec:    DVT prophylaxis:  Code Status:   Code Status: Full Code Family Communication: plan of care discussed with patient at bedside. Patient status is: Remains hospitalized because of severity of illness Level of care: Progressive   Dispo: The patient is from: home            Anticipated disposition: TBD Objective: Vitals last 24 hrs:  Vitals:   11/18/23 1408 11/18/23 2055 11/19/23 0511 11/19/23 1353  BP: 107/62 (!) 114/97 120/73 108/86  Pulse: 79 82 98 70  Resp: 18 16 20  (!) 22  Temp: 98.1 F (36.7 C) 98.1 F (36.7 C) 98.4 F (36.9 C) (!) 97.4 F (36.3 C)  TempSrc: Oral  Oral Oral Oral  SpO2: 100% 99% 99% 100%  Weight:      Height:       Physical Examination: General exam: alert awake, oriented, obese, laying in bed HEENT:Oral mucosa moist, Ear/Nose WNL grossly Respiratory system: Bilaterally clear BS, Cardiovascular system: S1 & S2 +, No JVD. Gastrointestinal system: Abdomen soft, tenderness in the mid abdomen,ND, BS+ Nervous System: Alert, awake, moving all extremities,and following commands. Extremities: extremities warm, leg edema neg Skin: Warm, no rashes MSK: Normal muscle bulk,tone, power   Medications reviewed:  Scheduled Meds:  sodium chloride    Intravenous Once   aspirin  EC  81 mg Oral Daily   Chlorhexidine  Gluconate Cloth  6 each Topical Daily   feeding supplement  237 mL Oral BID BM   folic acid   1 mg Oral Daily   insulin  aspart  0-6 Units Subcutaneous TID WC   levothyroxine   25 mcg Oral Q0600   rivaroxaban   20 mg Oral Daily   rosuvastatin   20 mg Oral Daily   sodium chloride  flush  10-40 mL Intracatheter Q12H   Continuous Infusions:  piperacillin -tazobactam (ZOSYN )  IV 3.375 g (11/19/23 1306)   Diet: Diet Order             Diet heart healthy/carb modified Room service appropriate? Yes; Fluid consistency: Thin  Diet effective now                    Data Reviewed: I have personally reviewed following labs and imaging studies ( see epic result tab) CBC: Recent Labs  Lab 11/17/23 1422 11/17/23 1727 11/18/23 0115 11/18/23 1752 11/19/23 0305 11/20/23 0303  WBC 2.2* 2.2* 1.7*  --  1.5* 1.8*  NEUTROABS 1.7 1.4*  --   --   --   --   HGB 7.9* 7.7* 6.9* 8.1* 7.7* 8.0*  HCT 22.5* 23.0* 20.1* 24.2* 22.6* 23.6*  MCV 89.6 93.1 93.1  --  92.2 91.5  PLT 138* 134* 117*  --  100* 79*   CMP: Recent Labs  Lab 11/17/23 1422 11/17/23 1727 11/18/23 0115 11/19/23 0305 11/20/23 0303  NA 136 136 137 138 137  K 2.9* 2.7* 3.0* 3.4* 3.3*  CL 101 100 102 102 100  CO2 27 23 25 25 27   GLUCOSE 121* 118* 110* 117* 104*  BUN 12 10 9  8 9   CREATININE 1.23* 1.18* 1.23* 1.22* 1.20*  CALCIUM  8.9 9.1 9.0 9.4 9.5  MG 1.1*  --   --   --   --    GFR: Estimated Creatinine Clearance: 59.4 mL/min (A) (by C-G formula based on SCr of 1.2 mg/dL (H)). Recent Labs  Lab 11/17/23 1422 11/17/23 1727 11/18/23 0115  AST 21 26 24   ALT 29 35 29  ALKPHOS 57 61 58  BILITOT 0.8 0.8 0.7  PROT 6.3* 6.2* 5.6*  ALBUMIN 3.7 3.7 3.4*   No results for input(s): LIPASE, AMYLASE in the last 168 hours. No results for input(s): AMMONIA in the last 168 hours. Coagulation Profile: No results for input(s): INR, PROTIME in the last 168 hours. Unresulted Labs (From admission, onward)     Start     Ordered   11/19/23 0500  Basic metabolic panel with GFR  Daily,   R     Question:  Specimen collection method  Answer:  IV Team=IV Team collect   11/18/23 0919   11/19/23 0500  CBC  Daily,   R     Question:  Specimen collection method  Answer:  IV Team=IV Team collect   11/18/23 0919   11/18/23 0949  Gastrointestinal Panel by PCR , Stool  (Gastrointestinal Panel by PCR, Stool                                                                                                                                                     **Does Not include CLOSTRIDIUM DIFFICILE testing. **If CDIFF testing is needed, place order from the C Difficile Testing order set.**)  Once,   R        11/18/23 0948   11/18/23 0949  C Difficile Quick Screen w PCR reflex  (C Difficile quick screen w PCR reflex panel )  Once, for 24 hours,   TIMED       References:    CDiff Information Tool   11/18/23 0948           Antimicrobials/Microbiology: Anti-infectives (From admission, onward)    Start     Dose/Rate Route Frequency Ordered Stop   11/18/23 0600  piperacillin -tazobactam (ZOSYN ) IVPB 3.375 g        3.375 g 12.5 mL/hr over 240 Minutes Intravenous Every 8 hours 11/17/23 2320     11/17/23 2245  piperacillin -tazobactam (ZOSYN ) IVPB 3.375 g        3.375 g 100 mL/hr  over 30 Minutes Intravenous  Once 11/17/23 2239 11/17/23 2329         Component Value Date/Time   SDES BLOOD BLOOD RIGHT HAND 10/14/2023 2156   SPECREQUEST  10/14/2023 2156    BOTTLES DRAWN AEROBIC AND ANAEROBIC Blood Culture adequate volume   CULT  10/14/2023 2156    NO GROWTH 5 DAYS Performed at [facility], [address] [place], [place] [address]    REPTSTATUS 10/19/2023 FINAL 10/14/2023 2156    Procedures:   Landon FORBES Baller, MD [facility] 11/20/2023, 9:23 AM

## 2023-11-20 NOTE — Telephone Encounter (Signed)
 Notified the pt daughter(Kim) regarding her FMLA forms being completed and ready for pick up. Daughter Rick) verbalized understanding. Picking up her copy today 11/20/2023.

## 2023-11-20 NOTE — Telephone Encounter (Signed)
 Pt was called concerning missed therapy appointment time this morning. Pts daughter answers and informs me pt is currently in the hospital and that she meant to call and cancel this appointment. Pt would like to keep appointments, plan to call early next week for update on pt condition and ask about ability to make it to therapy appointments. No show #1.  Lang Ada, PT, DPT [facility] Office: [phone] 10:17 AM, 11/20/23
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
## 2023-11-21 DIAGNOSIS — K529 Noninfective gastroenteritis and colitis, unspecified: Secondary | ICD-10-CM | POA: Diagnosis not present

## 2023-11-21 LAB — CBC
HCT: 22.2 % — ABNORMAL LOW (ref 36.0–46.0)
Hemoglobin: 7.7 g/dL — ABNORMAL LOW (ref 12.0–15.0)
MCH: 31.7 pg (ref 26.0–34.0)
MCHC: 34.7 g/dL (ref 30.0–36.0)
MCV: 91.4 fL (ref 80.0–100.0)
Platelets: 60 K/uL — ABNORMAL LOW (ref 150–400)
RBC: 2.43 MIL/uL — ABNORMAL LOW (ref 3.87–5.11)
RDW: 13.5 % (ref 11.5–15.5)
WBC: 1.7 K/uL — ABNORMAL LOW (ref 4.0–10.5)
nRBC: 0 % (ref 0.0–0.2)

## 2023-11-21 LAB — GLUCOSE, CAPILLARY
Glucose-Capillary: 106 mg/dL — ABNORMAL HIGH (ref 70–99)
Glucose-Capillary: 173 mg/dL — ABNORMAL HIGH (ref 70–99)
Glucose-Capillary: 116 mg/dL — ABNORMAL HIGH (ref 70–99)
Glucose-Capillary: 122 mg/dL — ABNORMAL HIGH (ref 70–99)

## 2023-11-21 LAB — BASIC METABOLIC PANEL WITH GFR
Anion gap: 11 (ref 5–15)
BUN: 9 mg/dL (ref 8–23)
CO2: 26 mmol/L (ref 22–32)
Calcium: 9.6 mg/dL (ref 8.9–10.3)
Chloride: 101 mmol/L (ref 98–111)
Creatinine, Ser: 1.24 mg/dL — ABNORMAL HIGH (ref 0.44–1.00)
GFR, Estimated: 49 mL/min — ABNORMAL LOW (ref >60)
Glucose, Bld: 108 mg/dL — ABNORMAL HIGH (ref 70–99)
Potassium: 3.7 mmol/L (ref 3.5–5.1)
Sodium: 139 mmol/L (ref 135–145)

## 2023-11-21 MED ORDER — PROCHLORPERAZINE EDISYLATE 10 MG/2ML IJ SOLN
10.0000 mg | Freq: Four times a day (QID) | INTRAMUSCULAR | Status: AC | PRN
  Administered 2023-11-21: 10 mg via INTRAVENOUS
  Filled 2023-11-21: qty 2

## 2023-11-21 NOTE — Progress Notes (Signed)
 PROGRESS NOTE    Becky [name]  FMW:[id number] DOB: Jul 05, 1960 DOA: 11/17/2023 PCP: Edman Meade PEDLAR, FNP   Brief Narrative:  Becky [name] is a 63 y.o. 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  Assessment & Plan:   Principal Problem:   Colitis Active Problems:   Atrial fibrillation, chronic (HCC)   Essential hypertension   Hypothyroidism   Type 2 diabetes mellitus with hyperlipidemia (HCC)   Non-small cell carcinoma of lung (HCC)   Hyperlipidemia   Normocytic anemia   Pancytopenia (HCC)   Hypokalemia  Acute intractable abdominal pain with possible colitis, POA Prolonged constipation, intractable nausea without vomiting, POA -Patient reports prolonged constipation in the setting of recent and ongoing chemotherapy, known to be a side effect per her discussion with oncology - Patient is been evaluated by GI multiple times with endoscopy earlier this year with no acute findings - Patient continues to have difficulty with bowel movements, constipation and subsequently results of abdominal pain distention and nausea - Lengthy discussion in regards to pretreating her constipation in anticipation of her current chemotherapy dosing if this continues to be an issue - Unlikely infectious at this time but will continue zosyn  per oncology recommendations - If no infection oncology recommending systemic steroids - Will initiate in 24h if no fevers/leukocytosis or other symptoms - GI pathogen panel, C. difficile ordered -cancelled stools were formed - GI recommending augmentin  at discharge to complete 5 day course    Hypokalemia: improving   Atrial fibrillation with RVR: -Rate controlled, continue Xarelto  - Likely secondary to above - TSH within normal  limits   Hypertension: -Currently well-controlled off antihypertensives other than diltiazem    Metastatic non-small lung cancer : On chemo and immunotherapy with oncology Abnormal gallbladder fossa on imaging, oncology aware   Pancytopenia: Related to chemotherapy. Hemoglobin stabilizing -1 unit PRBC given on 11/12  Hyperlipidemia:Cont statins. Hypothyroidism  Cont Synthroid  TSH normal. Sleep apnea Cont CPAP at bedtime. Diet controlled diabetes mellitus type 2: -Most recent A1c 6.0 Chronic kidney disease stage IIIa: -Creatinine at baseline  Class II Obesity w/ Body mass index is 34.91 kg/m.: Discussed weight loss,healthy lifestyle  DVT prophylaxis: rivaroxaban  (XARELTO ) tablet 20 mg  Code Status:   Code Status: Full Code Family Communication: At bedside  Status is: Inpatient  Dispo: The patient is from: Home              Anticipated d/c is to: Home              Anticipated d/c date is: 24 to 48 hours              Patient currently not medically stable for discharge  Consultants:  Oncology, GI  Procedures:  None  Antimicrobials:  Zosyn   Subjective: No issues or events overnight, denies nausea vomiting diarrhea headache fever chills chest pain shortness of breath.  Constipation appears to be ongoing but abdominal pain has resided overnight  Objective: Vitals:   11/20/23 0513 11/20/23 1340 11/20/23 2159 11/21/23 0659  BP: 111/66 119/70 109/73 108/61  Pulse: 86 (!) 102 97 76  Resp: 20 18 17 16   Temp: 98.5 F (36.9 C) 98.8 F (37.1 C) 99.2 F (37.3 C) 98.3 F (36.8 C)  TempSrc: Oral Oral Oral Oral  SpO2: 94% 100% 100% 100%  Weight:      Height:  Intake/Output Summary (Last 24 hours) at 11/21/2023 0759 Last data filed at 11/21/2023 0333 Gross per 24 hour  Intake 368.9 ml  Output --  Net 368.9 ml   Filed Weights   11/17/23 2342  Weight: 101.1 kg    Examination:  General:  Pleasantly resting in bed, No acute distress. HEENT:  Normocephalic  atraumatic.  Sclerae nonicteric, noninjected.  Extraocular movements intact bilaterally. Neck:  Without mass or deformity.  Trachea is midline. Lungs:  Clear to auscultate bilaterally without rhonchi, wheeze, or rales. Heart:  Regular rate and rhythm.  Without murmurs, rubs, or gallops. Abdomen:  Soft, nontender, nondistended.  Without guarding or rebound. Extremities: Without cyanosis, clubbing, edema, or obvious deformity. Skin:  Warm and dry, no erythema.   Data Reviewed: I have personally reviewed following labs and imaging studies  CBC: Recent Labs  Lab 11/17/23 1422 11/17/23 1422 11/17/23 1727 11/18/23 0115 11/18/23 1752 11/19/23 0305 11/20/23 0303 11/21/23 0139  WBC 2.2*  --  2.2* 1.7*  --  1.5* 1.8* 1.7*  NEUTROABS 1.7  --  1.4*  --   --   --   --   --   HGB 7.9*   < > 7.7* 6.9* 8.1* 7.7* 8.0* 7.7*  HCT 22.5*  --  23.0* 20.1* 24.2* 22.6* 23.6* 22.2*  MCV 89.6  --  93.1 93.1  --  92.2 91.5 91.4  PLT 138*  --  134* 117*  --  100* 79* 60*   < > = values in this interval not displayed.   Basic Metabolic Panel: Recent Labs  Lab 11/17/23 1422 11/17/23 1727 11/18/23 0115 11/19/23 0305 11/20/23 0303 11/21/23 0139  NA 136 136 137 138 137 139  K 2.9* 2.7* 3.0* 3.4* 3.3* 3.7  CL 101 100 102 102 100 101  CO2 27 23 25 25 27 26   GLUCOSE 121* 118* 110* 117* 104* 108*  BUN 12 10 9 8 9 9   CREATININE 1.23* 1.18* 1.23* 1.22* 1.20* 1.24*  CALCIUM  8.9 9.1 9.0 9.4 9.5 9.6  MG 1.1*  --   --   --   --   --    GFR: Estimated Creatinine Clearance: 57.5 mL/min (A) (by C-G formula based on SCr of 1.24 mg/dL (H)).  Liver Function Tests: Recent Labs  Lab 11/17/23 1422 11/17/23 1727 11/18/23 0115  AST 21 26 24   ALT 29 35 29  ALKPHOS 57 61 58  BILITOT 0.8 0.8 0.7  PROT 6.3* 6.2* 5.6*  ALBUMIN 3.7 3.7 3.4*   CBG: Recent Labs  Lab 11/20/23 0748 11/20/23 1126 11/20/23 1626 11/20/23 2156 11/21/23 0745  GLUCAP 105* 135* 134* 124* 106*    Recent Results (from the past  240 hours)  Resp panel by RT-PCR (RSV, Flu A&B, Covid) Anterior Nasal Swab     Status: None   Collection Time: 11/17/23  6:12 PM   Specimen: Anterior Nasal Swab  Result Value Ref Range Status   SARS Coronavirus 2 by RT PCR NEGATIVE NEGATIVE Final    Comment: (NOTE) SARS-CoV-2 target nucleic acids are NOT DETECTED.  The SARS-CoV-2 RNA is generally detectable in upper respiratory specimens during the acute phase of infection. The lowest concentration of SARS-CoV-2 viral copies this assay can detect is 138 copies/mL. A negative result does not preclude SARS-Cov-2 infection and should not be used as the sole basis for treatment or other patient management decisions. A negative result may occur with  improper specimen collection/handling, submission of specimen other than nasopharyngeal swab, presence of viral mutation(s)  within the areas targeted by this assay, and inadequate number of viral copies(<138 copies/mL). A negative result must be combined with clinical observations, patient history, and epidemiological information. The expected result is Negative.  Fact Sheet for Patients:  bloggercourse.com  Fact Sheet for Healthcare Providers:  seriousbroker.it  This test is no t yet approved or cleared by the [place]  FDA and  has been authorized for detection and/or diagnosis of SARS-CoV-2 by FDA under an Emergency Use Authorization (EUA). This EUA will remain  in effect (meaning this test can be used) for the duration of the COVID-19 declaration under Section 564(b)(1) of the Act, 21 U.S.C.section 360bbb-3(b)(1), unless the authorization is terminated  or revoked sooner.       Influenza A by PCR NEGATIVE NEGATIVE Final   Influenza B by PCR NEGATIVE NEGATIVE Final    Comment: (NOTE) The Xpert Xpress SARS-CoV-2/FLU/RSV plus assay is intended as an aid in the diagnosis of influenza from Nasopharyngeal swab specimens and should  not be used as a sole basis for treatment. Nasal washings and aspirates are unacceptable for Xpert Xpress SARS-CoV-2/FLU/RSV testing.  Fact Sheet for Patients: bloggercourse.com  Fact Sheet for Healthcare Providers: seriousbroker.it  This test is not yet approved or cleared by the [place]  FDA and has been authorized for detection and/or diagnosis of SARS-CoV-2 by FDA under an Emergency Use Authorization (EUA). This EUA will remain in effect (meaning this test can be used) for the duration of the COVID-19 declaration under Section 564(b)(1) of the Act, 21 U.S.C. section 360bbb-3(b)(1), unless the authorization is terminated or revoked.     Resp Syncytial Virus by PCR NEGATIVE NEGATIVE Final    Comment: (NOTE) Fact Sheet for Patients: bloggercourse.com  Fact Sheet for Healthcare Providers: seriousbroker.it  This test is not yet approved or cleared by the [place]  FDA and has been authorized for detection and/or diagnosis of SARS-CoV-2 by FDA under an Emergency Use Authorization (EUA). This EUA will remain in effect (meaning this test can be used) for the duration of the COVID-19 declaration under Section 564(b)(1) of the Act, 21 U.S.C. section 360bbb-3(b)(1), unless the authorization is terminated or revoked.  Performed at [facility], [address], [place], [place] [address]    Radiology Studies: No results found.  Scheduled Meds:  sodium chloride    Intravenous Once   aspirin  EC  81 mg Oral Daily   Chlorhexidine  Gluconate Cloth  6 each Topical Daily   diltiazem   360 mg Oral QHS   feeding supplement  237 mL Oral BID BM   folic acid   1 mg Oral Daily   insulin  aspart  0-6 Units Subcutaneous TID WC   levothyroxine   25 mcg Oral Q0600   rivaroxaban   20 mg Oral Daily   rosuvastatin   20 mg Oral Daily   sodium chloride  flush  10-40 mL  Intracatheter Q12H   Continuous Infusions:  piperacillin -tazobactam (ZOSYN )  IV 3.375 g (11/21/23 0527)     LOS: 3 days   Time spent:  Elsie JAYSON Montclair, DO [facility]  If 7PM-7AM, please contact night-coverage [email or web address]  11/21/2023, 7:59 AM

## 2023-11-21 NOTE — Progress Notes (Signed)
   11/21/23 2259  BiPAP/CPAP/SIPAP  BiPAP/CPAP/SIPAP Pt Type Adult  BiPAP/CPAP/SIPAP Resmed (home unit)  Mask Type Full face mask  Dentures removed? Not applicable  Mask Size Medium  EPAP 13 cmH2O  FiO2 (%) 21 %  Patient Home Machine Yes  Safety Check Completed by RT for Home Unit Yes, no issues noted  Patient Home Mask Yes  Patient Home Tubing Yes  CPAP/SIPAP surface wiped down Yes  Device Plugged into RED Power Outlet Yes

## 2023-11-22 DIAGNOSIS — K529 Noninfective gastroenteritis and colitis, unspecified: Secondary | ICD-10-CM | POA: Diagnosis not present

## 2023-11-22 LAB — BASIC METABOLIC PANEL WITH GFR
Anion gap: 10 (ref 5–15)
BUN: 11 mg/dL (ref 8–23)
CO2: 30 mmol/L (ref 22–32)
Calcium: 10.2 mg/dL (ref 8.9–10.3)
Chloride: 99 mmol/L (ref 98–111)
Creatinine, Ser: 1.35 mg/dL — ABNORMAL HIGH (ref 0.44–1.00)
GFR, Estimated: 44 mL/min — ABNORMAL LOW (ref >60)
Glucose, Bld: 116 mg/dL — ABNORMAL HIGH (ref 70–99)
Potassium: 3.5 mmol/L (ref 3.5–5.1)
Sodium: 139 mmol/L (ref 135–145)

## 2023-11-22 LAB — CBC
HCT: 24.3 % — ABNORMAL LOW (ref 36.0–46.0)
Hemoglobin: 8.3 g/dL — ABNORMAL LOW (ref 12.0–15.0)
MCH: 30.9 pg (ref 26.0–34.0)
MCHC: 34.2 g/dL (ref 30.0–36.0)
MCV: 90.3 fL (ref 80.0–100.0)
Platelets: 57 K/uL — ABNORMAL LOW (ref 150–400)
RBC: 2.69 MIL/uL — ABNORMAL LOW (ref 3.87–5.11)
RDW: 13.4 % (ref 11.5–15.5)
WBC: 2.2 K/uL — ABNORMAL LOW (ref 4.0–10.5)
nRBC: 0 % (ref 0.0–0.2)

## 2023-11-22 LAB — GLUCOSE, CAPILLARY
Glucose-Capillary: 126 mg/dL — ABNORMAL HIGH (ref 70–99)
Glucose-Capillary: 123 mg/dL — ABNORMAL HIGH (ref 70–99)

## 2023-11-22 MED ORDER — AMOXICILLIN-POT CLAVULANATE 875-125 MG PO TABS: 1.0000 | ORAL_TABLET | Freq: Two times a day (BID) | ORAL | 0 refills | Status: AC

## 2023-11-22 MED ORDER — SENEXON-S 8.6-50 MG PO TABS: 1.0000 | ORAL_TABLET | Freq: Two times a day (BID) | ORAL | 0 refills | Status: AC

## 2023-11-22 MED ORDER — POLYETHYLENE GLYCOL 3350 17 G PO PACK: 17.0000 g | PACK | Freq: Two times a day (BID) | ORAL | 1 refills | Status: DC

## 2023-11-22 MED ORDER — HEPARIN SOD (PORK) LOCK FLUSH 100 UNIT/ML IV SOLN
500.0000 [IU] | Freq: Once | INTRAVENOUS | Status: AC
  Administered 2023-11-22: 500 [IU] via INTRAVENOUS
  Filled 2023-11-22: qty 5

## 2023-11-22 MED ORDER — ONDANSETRON HCL 8 MG PO TABS: 8.0000 mg | ORAL_TABLET | Freq: Three times a day (TID) | ORAL | 1 refills | Status: AC | PRN

## 2023-11-22 NOTE — Progress Notes (Signed)
 Patient discharged: Home with family  Via: Wheelchair   Discharge paperwork given: to patient and family  Reviewed with teach back  Port de accessed  and telemetry disconnected  Belongings given to patient

## 2023-11-22 NOTE — Discharge Summary (Signed)
 Physician Discharge Summary  Becky [name] FMW:[id number] DOB: 08/26/1960 DOA: 11/17/2023  PCP: Edman Meade PEDLAR, FNP  Admit date: 11/17/2023 Discharge date: 11/22/2023  Admitted From: Home Disposition: Home  Recommendations for Outpatient Follow-up:  Follow up with PCP in 1-2 weeks Follow-up with GI as scheduled Follow-up with oncology as scheduled  Home Health: None Equipment/Devices: None  Discharge Condition: Stable CODE STATUS: Full Diet recommendation: Low-salt low-fat diet  Brief/Interim Summary: Becky [name] is a 63 y.o. 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  Patient admitted as above with acute on chronic intractable abdominal pain, this appears to be recurrent after episodes of chemotherapy.  Patient denies any diarrhea but notably constipation abdominal pain/pressure and nausea.  Patient improved with supportive care, fluids, increased MiraLAX  and Senokot now having multiple bowel movements per day with improving symptoms.  P.o. intake appears to continue to be poor but improving.  Recommend advancing diet as tolerated, follow-up outpatient with PCP oncology and GI as scheduled.  If worsening symptoms or unable to tolerate p.o. safely would recommend return to the hospital for further evaluation and treatment.  Discharge Diagnoses:  Principal Problem:   Colitis Active Problems:   Atrial fibrillation, chronic (HCC)   Essential hypertension   Hypothyroidism   Type 2 diabetes mellitus with hyperlipidemia (HCC)   Non-small cell carcinoma of lung (HCC)   Hyperlipidemia   Normocytic anemia   Pancytopenia (HCC)   Hypokalemia  Acute on chronic intractable abdominal pain with possible colitis, POA Prolonged constipation, intractable nausea  without vomiting, POA -Patient reports prolonged constipation in the setting of recent and ongoing chemotherapy, known to be a side effect per her discussion with oncology - Patient is been evaluated by GI multiple times with endoscopy earlier this year with no acute findings - Patient now having multiple bowel movements with increased supportive care - Lengthy discussion in regards to pretreating her constipation in anticipation of her current chemotherapy dosing if this continues to be an issue - Unlikely infectious at this time but will continue antibiotics per recommendations for an additional 5 days at discharge.  Hypokalemia: Improving   Atrial fibrillation with RVR: -Rate controlled, continue Xarelto  - Likely secondary to above - TSH within normal limits   Hypertension: -Currently well-controlled off antihypertensives other than diltiazem    Metastatic non-small lung cancer : On chemo and immunotherapy with oncology Abnormal gallbladder fossa on imaging, oncology aware   Pancytopenia: Related to chemotherapy. Hemoglobin stabilizing -1 unit PRBC given on 11/12   Hyperlipidemia:Cont statins. Hypothyroidism  Cont Synthroid  TSH normal. Sleep apnea Cont CPAP at bedtime. Diet controlled diabetes mellitus type 2: -Most recent A1c 6.0 Chronic kidney disease stage IIIa: -Creatinine at baseline  Class II Obesity w/ Body mass index is 34.91 kg/m.: Discussed weight loss,healthy lifestyle  Discharge Instructions  Discharge Instructions     Call MD for:  difficulty breathing, headache or visual disturbances   Complete by: As directed    Call MD for:  extreme fatigue   Complete by: As directed    Call MD for:  hives   Complete by: As directed    Call MD for:  persistant dizziness or light-headedness   Complete by: As directed    Call MD for:  persistant nausea and vomiting   Complete by: As directed    Call MD for:  severe uncontrolled pain  Complete by: As directed    Call  MD for:  temperature >100.4   Complete by: As directed    Diet - low sodium heart healthy   Complete by: As directed    Increase activity slowly   Complete by: As directed       Allergies as of 11/22/2023       Reactions   Apresoline  [hydralazine ] Nausea Only   Latex Other (See Comments)   Blisters/burns/welts    Other Other (See Comments)   Band aids discolor skin EKG pads irritate skin    Prednisone  Swelling   Patient states that she can take methylprednisolone  without complications She has tolerated PO dexamethasone  well. No complaints of swelling voiced from patient.        Medication List     STOP taking these medications    glimepiride  2 MG tablet Commonly known as: AMARYL    LORazepam  0.5 MG tablet Commonly known as: ATIVAN    metoprolol  tartrate 25 MG tablet Commonly known as: LOPRESSOR    neomycin-polymyxin b-dexamethasone  3.5-10000-0.1 Susp Commonly known as: MAXITROL   olmesartan  20 MG tablet Commonly known as: BENICAR        TAKE these medications    acetaminophen  500 MG tablet Commonly known as: TYLENOL  Take 1,000 mg by mouth every 6 (six) hours as needed for mild pain (pain score 1-3).   albuterol  108 (90 Base) MCG/ACT inhaler Commonly known as: VENTOLIN  HFA Inhale 2 puffs into the lungs every 4 (four) hours as needed for wheezing or shortness of breath.   amoxicillin -clavulanate 875-125 MG tablet Commonly known as: AUGMENTIN  Take 1 tablet by mouth 2 (two) times daily for 5 days.   aspirin  EC 81 MG tablet Take 1 tablet (81 mg total) by mouth daily. Swallow whole.   Azelastine  HCl 137 MCG/SPRAY Soln Place 1 spray into both nostrils daily.   dexlansoprazole  60 MG capsule Commonly known as: DEXILANT  Take 1 capsule (60 mg total) by mouth daily.   EPINEPHrine  0.3 mg/0.3 mL Soaj injection Commonly known as: EPI-PEN Inject 0.3 mg into the muscle once as needed for anaphylaxis.   fluconazole  100 MG tablet Commonly known as: DIFLUCAN  Take  100 mg by mouth daily.   folic acid  1 MG tablet Commonly known as: FOLVITE  Take 1 tablet (1 mg total) by mouth daily.   Klor-Con  M20 20 MEQ tablet Generic drug: potassium chloride  SA Take 1 tablet (20 mEq total) by mouth daily as needed. Pt takes as needed when she has cramps   lidocaine -prilocaine  cream Commonly known as: EMLA  APPLY TOPICALLY TO AFFECTED AREAS ONCE DAILY What changed: See the new instructions.   linaclotide  72 MCG capsule Commonly known as: Linzess  Take 1 capsule (72 mcg total) by mouth daily before breakfast. What changed: Another medication with the same name was removed. Continue taking this medication, and follow the directions you see here.   magnesium  oxide 400 (240 Mg) MG tablet Commonly known as: MAG-OX Take 1 tablet (400 mg total) by mouth 2 (two) times daily.   montelukast  10 MG tablet Commonly known as: SINGULAIR  Take 10 mg by mouth at bedtime.   NON FORMULARY Pt uses a cpap nightly   ondansetron  8 MG tablet Commonly known as: ZOFRAN  Take 1 tablet (8 mg total) by mouth every 8 (eight) hours as needed for nausea or vomiting.   polyethylene glycol 17 g packet Commonly known as: MiraLax  Take 17 g by mouth 2 (two) times daily. What changed: when to take this   prochlorperazine  10 MG tablet  Commonly known as: COMPAZINE  Take 1 tablet (10 mg total) by mouth every 6 (six) hours as needed.   Restasis  0.05 % ophthalmic emulsion Generic drug: cycloSPORINE  Place 1 drop into both eyes daily as needed (dry eyes).   rivaroxaban  20 MG Tabs tablet Commonly known as: Xarelto  Take 1 tablet (20 mg total) by mouth daily.   rosuvastatin  20 MG tablet Commonly known as: CRESTOR  Take 1 tablet (20 mg total) by mouth daily.   Senexon-S 8.6-50 MG tablet Generic drug: senna-docusate Take 1 tablet by mouth 2 (two) times daily. What changed: when to take this   Synthroid  25 MCG tablet Generic drug: levothyroxine  Take 1 tablet (25 mcg total) by mouth daily  before breakfast.   Tiadylt  ER 360 MG 24 hr capsule Generic drug: diltiazem  TAKE ONE (1) CAPSULE BY MOUTH ONCE DAILY *NEW PRESCRIPTION REQUEST* What changed: See the new instructions.        Allergies  Allergen Reactions   Apresoline  [Hydralazine ] Nausea Only   Latex Other (See Comments)    Blisters/burns/welts    Other Other (See Comments)    Band aids discolor skin EKG pads irritate skin    Prednisone  Swelling    Patient states that she can take methylprednisolone  without complications She has tolerated PO dexamethasone  well. No complaints of swelling voiced from patient.    Consultations: Oncology, GI  Procedures/Studies: CT ABDOMEN PELVIS WO CONTRAST Result Date: 11/17/2023 EXAM: CT ABDOMEN AND PELVIS WITHOUT CONTRAST 11/17/2023 07:45:00 PM TECHNIQUE: CT of the abdomen and pelvis was performed without the administration of intravenous contrast. Multiplanar reformatted images are provided for review. Automated exposure control, iterative reconstruction, and/or weight-based adjustment of the mA/kV was utilized to reduce the radiation dose to as low as reasonably achievable. COMPARISON: 09/18/2023 CLINICAL HISTORY: Bowel obstruction suspected. FINDINGS: LOWER CHEST: No acute abnormality. LIVER: 3.5 cm low density lesion inferiorly along the gallbladder fossa (image 20), possibly reflecting focal fat, but new from recent prior, metastasis not excluded. GALLBLADDER AND BILE DUCTS: Layering tiny gallstones, without associated inflammatory changes. No biliary ductal dilatation. SPLEEN: No acute abnormality. PANCREAS: No acute abnormality. ADRENAL GLANDS: 16 mm left adrenal nodule (image 24) unchanged from multiple priors, likely reflecting a benign adrenal adenoma. KIDNEYS, URETERS AND BLADDER: No stones in the kidneys or ureters. No hydronephrosis. No perinephric or periureteral stranding. Urinary bladder is unremarkable. GI AND BOWEL: Stomach demonstrates no acute abnormality. Short  segment wall thickening with pericolonic inflammatory changes along the proximal transverse colon (image 36), suggesting infectious/inflammatory colitis. Normal appendix (image 55). Sigmoid diverticulosis, without evidence of diverticulitis. There is no bowel obstruction. PERITONEUM AND RETROPERITONEUM: No ascites. No free air. VASCULATURE: Aorta is normal in caliber. Atherosclerotic calcifications of the abdominal aorta and branch vessels. LYMPH NODES: No lymphadenopathy. REPRODUCTIVE ORGANS: The uterus is within normal limits. BONES AND SOFT TISSUES: Mild degenerative changes of the lower thoracic spine. No acute osseous abnormality. No focal soft tissue abnormality. IMPRESSION: 1. Suspected infectious/inflammatory colitis involving the proximal transverse colon. 2. New 3.5 cm low-density lesion along the gallbladder fossa, possibly reflecting focal fat, although metastasis is not excluded. Consider follow-up MR abdomen with/without contrast in 4-6 weeks. Electronically signed by: Pinkie Pebbles MD 11/17/2023 07:52 PM EST RP Workstation: [id number]   DG Chest Port 1 View Result Date: 11/17/2023 CLINICAL DATA:  Cough. EXAM: PORTABLE CHEST 1 VIEW COMPARISON:  Chest radiograph dated 10/15/2023. FINDINGS: Port-A-Cath in similar position. Right suprahilar streaky density progressed since the prior radiograph. No new consolidation. No pleural effusion pneumothorax. Stable cardiac silhouette no  acute osseous pathology. IMPRESSION: No acute cardiopulmonary process. Electronically Signed   By: Vanetta Chou M.D.   On: 11/17/2023 17:50     Subjective: No acute issues or events overnight denies nausea vomiting diarrhea headache fevers chills or chest pain   Discharge Exam: Vitals:   11/21/23 2134 11/22/23 0528  BP: 131/74 128/66  Pulse: 82 97  Resp: 20 18  Temp: 98.3 F (36.8 C) 98.5 F (36.9 C)  SpO2: 99% 100%   Vitals:   11/21/23 0659 11/21/23 1316 11/21/23 2134 11/22/23 0528  BP: 108/61 111/64  131/74 128/66  Pulse: 76 80 82 97  Resp: 16 16 20 18   Temp: 98.3 F (36.8 C) 98 F (36.7 C) 98.3 F (36.8 C) 98.5 F (36.9 C)  TempSrc: Oral Oral Oral   SpO2: 100% 100% 99% 100%  Weight:      Height:        General: Pt is alert, awake, not in acute distress Cardiovascular: RRR, S1/S2 +, no rubs, no gallops Respiratory: CTA bilaterally, no wheezing, no rhonchi Abdominal: Soft, NT, ND, bowel sounds + Extremities: no edema, no cyanosis    The results of significant diagnostics from this hospitalization (including imaging, microbiology, ancillary and laboratory) are listed below for reference.     Microbiology: Recent Results (from the past 240 hours)  Resp panel by RT-PCR (RSV, Flu A&B, Covid) Anterior Nasal Swab     Status: None   Collection Time: 11/17/23  6:12 PM   Specimen: Anterior Nasal Swab  Result Value Ref Range Status   SARS Coronavirus 2 by RT PCR NEGATIVE NEGATIVE Final    Comment: (NOTE) SARS-CoV-2 target nucleic acids are NOT DETECTED.  The SARS-CoV-2 RNA is generally detectable in upper respiratory specimens during the acute phase of infection. The lowest concentration of SARS-CoV-2 viral copies this assay can detect is 138 copies/mL. A negative result does not preclude SARS-Cov-2 infection and should not be used as the sole basis for treatment or other patient management decisions. A negative result may occur with  improper specimen collection/handling, submission of specimen other than nasopharyngeal swab, presence of viral mutation(s) within the areas targeted by this assay, and inadequate number of viral copies(<138 copies/mL). A negative result must be combined with clinical observations, patient history, and epidemiological information. The expected result is Negative.  Fact Sheet for Patients:  bloggercourse.com  Fact Sheet for Healthcare Providers:  seriousbroker.it  This test is no t yet  approved or cleared by the [place]  FDA and  has been authorized for detection and/or diagnosis of SARS-CoV-2 by FDA under an Emergency Use Authorization (EUA). This EUA will remain  in effect (meaning this test can be used) for the duration of the COVID-19 declaration under Section 564(b)(1) of the Act, 21 U.S.C.section 360bbb-3(b)(1), unless the authorization is terminated  or revoked sooner.       Influenza A by PCR NEGATIVE NEGATIVE Final   Influenza B by PCR NEGATIVE NEGATIVE Final    Comment: (NOTE) The Xpert Xpress SARS-CoV-2/FLU/RSV plus assay is intended as an aid in the diagnosis of influenza from Nasopharyngeal swab specimens and should not be used as a sole basis for treatment. Nasal washings and aspirates are unacceptable for Xpert Xpress SARS-CoV-2/FLU/RSV testing.  Fact Sheet for Patients: bloggercourse.com  Fact Sheet for Healthcare Providers: seriousbroker.it  This test is not yet approved or cleared by the [place]  FDA and has been authorized for detection and/or diagnosis of SARS-CoV-2 by FDA under an Emergency Use Authorization (EUA). This  EUA will remain in effect (meaning this test can be used) for the duration of the COVID-19 declaration under Section 564(b)(1) of the Act, 21 U.S.C. section 360bbb-3(b)(1), unless the authorization is terminated or revoked.     Resp Syncytial Virus by PCR NEGATIVE NEGATIVE Final    Comment: (NOTE) Fact Sheet for Patients: bloggercourse.com  Fact Sheet for Healthcare Providers: seriousbroker.it  This test is not yet approved or cleared by the [place]  FDA and has been authorized for detection and/or diagnosis of SARS-CoV-2 by FDA under an Emergency Use Authorization (EUA). This EUA will remain in effect (meaning this test can be used) for the duration of the COVID-19 declaration under Section 564(b)(1) of  the Act, 21 U.S.C. section 360bbb-3(b)(1), unless the authorization is terminated or revoked.  Performed at [facility], [address], [place], [place] [address]      Labs: BNP (last 3 results) Recent Labs    02/12/23 1017  BNP 80.0   Basic Metabolic Panel: Recent Labs  Lab 11/17/23 1422 11/17/23 1727 11/18/23 0115 11/19/23 0305 11/20/23 0303 11/21/23 0139 11/22/23 0517  NA 136   < > 137 138 137 139 139  K 2.9*   < > 3.0* 3.4* 3.3* 3.7 3.5  CL 101   < > 102 102 100 101 99  CO2 27   < > 25 25 27 26 30   GLUCOSE 121*   < > 110* 117* 104* 108* 116*  BUN 12   < > 9 8 9 9 11   CREATININE 1.23*   < > 1.23* 1.22* 1.20* 1.24* 1.35*  CALCIUM  8.9   < > 9.0 9.4 9.5 9.6 10.2  MG 1.1*  --   --   --   --   --   --    < > = values in this interval not displayed.   Liver Function Tests: Recent Labs  Lab 11/17/23 1422 11/17/23 1727 11/18/23 0115  AST 21 26 24   ALT 29 35 29  ALKPHOS 57 61 58  BILITOT 0.8 0.8 0.7  PROT 6.3* 6.2* 5.6*  ALBUMIN 3.7 3.7 3.4*   No results for input(s): LIPASE, AMYLASE in the last 168 hours. No results for input(s): AMMONIA in the last 168 hours. CBC: Recent Labs  Lab 11/17/23 1422 11/17/23 1422 11/17/23 1727 11/18/23 0115 11/18/23 1752 11/19/23 0305 11/20/23 0303 11/21/23 0139 11/22/23 0517  WBC 2.2*   < > 2.2* 1.7*  --  1.5* 1.8* 1.7* 2.2*  NEUTROABS 1.7  --  1.4*  --   --   --   --   --   --   HGB 7.9*   < > 7.7* 6.9* 8.1* 7.7* 8.0* 7.7* 8.3*  HCT 22.5*  --  23.0* 20.1* 24.2* 22.6* 23.6* 22.2* 24.3*  MCV 89.6  --  93.1 93.1  --  92.2 91.5 91.4 90.3  PLT 138*   < > 134* 117*  --  100* 79* 60* 57*   < > = values in this interval not displayed.   Cardiac Enzymes: No results for input(s): CKTOTAL, CKMB, CKMBINDEX, TROPONINI in the last 168 hours. BNP: Invalid input(s): POCBNP CBG: Recent Labs  Lab 11/21/23 1154 11/21/23 1701 11/21/23 2130 11/22/23 0750 11/22/23 1124  GLUCAP 173* 116* 122*  126* 123*   D-Dimer No results for input(s): DDIMER in the last 72 hours. Hgb A1c No results for input(s): HGBA1C in the last 72 hours. Lipid Profile No results for input(s): CHOL, HDL, LDLCALC, TRIG, CHOLHDL, LDLDIRECT  in the last 72 hours. Thyroid  function studies No results for input(s): TSH, T4TOTAL, T3FREE, THYROIDAB in the last 72 hours.  Invalid input(s): FREET3 Anemia work up No results for input(s): VITAMINB12, FOLATE, FERRITIN, TIBC, IRON, RETICCTPCT in the last 72 hours. Urinalysis    Component Value Date/Time   COLORURINE YELLOW 10/14/2023 2113   APPEARANCEUR HAZY (A) 10/14/2023 2113   APPEARANCEUR Clear 09/23/2023 1432   LABSPEC 1.011 10/14/2023 2113   PHURINE 5.0 10/14/2023 2113   GLUCOSEU NEGATIVE 10/14/2023 2113   HGBUR NEGATIVE 10/14/2023 2113   BILIRUBINUR NEGATIVE 10/14/2023 2113   BILIRUBINUR Negative 09/23/2023 1432   KETONESUR NEGATIVE 10/14/2023 2113   PROTEINUR NEGATIVE 10/14/2023 2113   UROBILINOGEN 0.2 01/16/2018 1308   NITRITE NEGATIVE 10/14/2023 2113   LEUKOCYTESUR NEGATIVE 10/14/2023 2113   Sepsis Labs Recent Labs  Lab 11/19/23 0305 11/20/23 0303 11/21/23 0139 11/22/23 0517  WBC 1.5* 1.8* 1.7* 2.2*   Microbiology Recent Results (from the past 240 hours)  Resp panel by RT-PCR (RSV, Flu A&B, Covid) Anterior Nasal Swab     Status: None   Collection Time: 11/17/23  6:12 PM   Specimen: Anterior Nasal Swab  Result Value Ref Range Status   SARS Coronavirus 2 by RT PCR NEGATIVE NEGATIVE Final    Comment: (NOTE) SARS-CoV-2 target nucleic acids are NOT DETECTED.  The SARS-CoV-2 RNA is generally detectable in upper respiratory specimens during the acute phase of infection. The lowest concentration of SARS-CoV-2 viral copies this assay can detect is 138 copies/mL. A negative result does not preclude SARS-Cov-2 infection and should not be used as the sole basis for treatment or other patient management  decisions. A negative result may occur with  improper specimen collection/handling, submission of specimen other than nasopharyngeal swab, presence of viral mutation(s) within the areas targeted by this assay, and inadequate number of viral copies(<138 copies/mL). A negative result must be combined with clinical observations, patient history, and epidemiological information. The expected result is Negative.  Fact Sheet for Patients:  bloggercourse.com  Fact Sheet for Healthcare Providers:  seriousbroker.it  This test is no t yet approved or cleared by the [place]  FDA and  has been authorized for detection and/or diagnosis of SARS-CoV-2 by FDA under an Emergency Use Authorization (EUA). This EUA will remain  in effect (meaning this test can be used) for the duration of the COVID-19 declaration under Section 564(b)(1) of the Act, 21 U.S.C.section 360bbb-3(b)(1), unless the authorization is terminated  or revoked sooner.       Influenza A by PCR NEGATIVE NEGATIVE Final   Influenza B by PCR NEGATIVE NEGATIVE Final    Comment: (NOTE) The Xpert Xpress SARS-CoV-2/FLU/RSV plus assay is intended as an aid in the diagnosis of influenza from Nasopharyngeal swab specimens and should not be used as a sole basis for treatment. Nasal washings and aspirates are unacceptable for Xpert Xpress SARS-CoV-2/FLU/RSV testing.  Fact Sheet for Patients: bloggercourse.com  Fact Sheet for Healthcare Providers: seriousbroker.it  This test is not yet approved or cleared by the [place]  FDA and has been authorized for detection and/or diagnosis of SARS-CoV-2 by FDA under an Emergency Use Authorization (EUA). This EUA will remain in effect (meaning this test can be used) for the duration of the COVID-19 declaration under Section 564(b)(1) of the Act, 21 U.S.C. section 360bbb-3(b)(1), unless the  authorization is terminated or revoked.     Resp Syncytial Virus by PCR NEGATIVE NEGATIVE Final    Comment: (NOTE) Fact Sheet for Patients: bloggercourse.com  Fact  Sheet for Healthcare Providers: seriousbroker.it  This test is not yet approved or cleared by the [place]  FDA and has been authorized for detection and/or diagnosis of SARS-CoV-2 by FDA under an Emergency Use Authorization (EUA). This EUA will remain in effect (meaning this test can be used) for the duration of the COVID-19 declaration under Section 564(b)(1) of the Act, 21 U.S.C. section 360bbb-3(b)(1), unless the authorization is terminated or revoked.  Performed at [facility], [address] [place], [place] [address]      Time coordinating discharge: Over 30 minutes  SIGNED:   Elsie JAYSON Montclair, DO [facility] 11/22/2023, 12:43 PM Pager   If 7PM-7AM, please contact night-coverage [email or web address]

## 2023-11-23 ENCOUNTER — Ambulatory Visit

## 2023-11-23 ENCOUNTER — Other Ambulatory Visit

## 2023-11-23 ENCOUNTER — Telehealth: Payer: Self-pay

## 2023-11-23 ENCOUNTER — Ambulatory Visit: Admitting: Internal Medicine

## 2023-11-23 ENCOUNTER — Ambulatory Visit (HOSPITAL_COMMUNITY)
Admission: RE | Admit: 2023-11-23 | Discharge: 2023-11-23 | Disposition: A | Discharge status: Home / Self Care | Source: Ambulatory Visit | Attending: Physician Assistant | Admitting: Physician Assistant

## 2023-11-23 DIAGNOSIS — C3491 Malignant neoplasm of unspecified part of right bronchus or lung: Secondary | ICD-10-CM | POA: Insufficient documentation

## 2023-11-23 NOTE — Transitions of Care (Post Inpatient/ED Visit) (Signed)
   11/23/2023  Name: Nastasia Kage MRN: [id number] DOB: August 16, 1960  Today's TOC FU Call Status: Today's TOC FU Call Status:: Unsuccessful Call (1st Attempt) Unsuccessful Call (1st Attempt) Date: 11/23/23  Attempted to reach the patient regarding the most recent Inpatient/ED visit.  Spoke with daughter Lakeia Bradshaw who stats patient can not talk as she is at an appointment.   Follow Up Plan: Additional outreach attempts will be made to reach the patient to complete the Transitions of Care (Post Inpatient/ED visit) call.   Richerd Fish, RN, BSN, CCM [facility], [facility] Management Coordinator Direct Dial: [phone]

## 2023-11-24 ENCOUNTER — Encounter (HOSPITAL_COMMUNITY)

## 2023-11-24 ENCOUNTER — Ambulatory Visit: Attending: Nurse Practitioner | Admitting: Nurse Practitioner

## 2023-11-24 ENCOUNTER — Other Ambulatory Visit: Payer: Self-pay | Admitting: Nurse Practitioner

## 2023-11-24 ENCOUNTER — Telehealth: Payer: Self-pay

## 2023-11-24 ENCOUNTER — Telehealth: Payer: Self-pay | Admitting: Nurse Practitioner

## 2023-11-24 ENCOUNTER — Encounter: Payer: Self-pay | Admitting: Nurse Practitioner

## 2023-11-24 ENCOUNTER — Other Ambulatory Visit: Payer: Self-pay

## 2023-11-24 ENCOUNTER — Ambulatory Visit: Attending: Nurse Practitioner

## 2023-11-24 VITALS — BP 162/98 | HR 78 | Ht 67.0 in | Wt 217.8 lb

## 2023-11-24 DIAGNOSIS — I10 Essential (primary) hypertension: Secondary | ICD-10-CM

## 2023-11-24 DIAGNOSIS — R002 Palpitations: Secondary | ICD-10-CM | POA: Diagnosis not present

## 2023-11-24 DIAGNOSIS — R000 Tachycardia, unspecified: Secondary | ICD-10-CM

## 2023-11-24 DIAGNOSIS — I480 Paroxysmal atrial fibrillation: Secondary | ICD-10-CM | POA: Diagnosis not present

## 2023-11-24 DIAGNOSIS — N1831 Chronic kidney disease, stage 3a: Secondary | ICD-10-CM

## 2023-11-24 DIAGNOSIS — R531 Weakness: Secondary | ICD-10-CM

## 2023-11-24 DIAGNOSIS — I38 Endocarditis, valve unspecified: Secondary | ICD-10-CM

## 2023-11-24 DIAGNOSIS — Z87898 Personal history of other specified conditions: Secondary | ICD-10-CM

## 2023-11-24 DIAGNOSIS — E785 Hyperlipidemia, unspecified: Secondary | ICD-10-CM

## 2023-11-24 MED ORDER — METOPROLOL TARTRATE 25 MG PO TABS: 12.5000 mg | ORAL_TABLET | Freq: Two times a day (BID) | ORAL | Status: DC | PRN

## 2023-11-24 NOTE — Transitions of Care (Post Inpatient/ED Visit) (Signed)
 11/24/2023  Name: Becky [name] MRN: [id number] DOB: 1960-12-21  Today's TOC FU Call Status: Today's TOC FU Call Status:: Successful TOC FU Call Completed TOC FU Call Complete Date: 11/24/23  Patient's Name and Date of Birth confirmed. Name, DOB  Transition Care Management Follow-up Telephone Call Date of Discharge: 11/22/23 Discharge Facility: [facility]) Type of Discharge: Inpatient Admission Primary Inpatient Discharge Diagnosis:: Colitis How have you been since you were released from the hospital?: Better Any questions or concerns?: No  Items Reviewed: Did you receive and understand the discharge instructions provided?: Yes Medications obtained,verified, and reconciled?: Yes (Medications Reviewed) Any new allergies since your discharge?: No Dietary orders reviewed?: Yes Type of Diet Ordered:: Heart Healthy Low sodium Do you have support at home?: Yes People in Home [RPT]: child(ren), adult, spouse Name of Support/Comfort Primary Source: Luke, daughter, very helpful  Medications Reviewed Today: Medications Reviewed Today     Reviewed by Eilleen Richerd GRADE, RN (Registered Nurse) on 11/24/23 at 1438  Med List Status: <None>   Medication Order Taking? Sig Documenting Provider Last Dose Status Informant  acetaminophen  (TYLENOL ) 500 MG tablet [id number]  Take 1,000 mg by mouth every 6 (six) hours as needed for mild pain (pain score 1-3). [provider]  Active Pharmacy Records, Self, Child  albuterol  (VENTOLIN  HFA) [phone] Base) MCG/ACT inhaler [id number]  Inhale 2 puffs into the lungs every 4 (four) hours as needed for wheezing or shortness of breath. Stuart Vernell Norris, PA-C  Active Pharmacy Records, Self, Child  amoxicillin -clavulanate (AUGMENTIN ) 875-125 MG tablet [id number]  Take 1 tablet by mouth 2 (two) times daily for 5 days. Lue Elsie BROCKS, MD  Active   aspirin  EC 81 MG tablet [id number]  Take 1 tablet (81 mg total) by mouth daily. Swallow whole.  Ricky Fines, MD  Active Pharmacy Records, Self, Child  Azelastine  HCl 137 MCG/SPRAY SOLN [id number]  Place 1 spray into both nostrils daily. [provider]  Active Self, Child, Pharmacy Records  dexlansoprazole  (DEXILANT ) 60 MG capsule [id number]  Take 1 capsule (60 mg total) by mouth daily. Cindie Carlin POUR, DO  Active Self, Child, Pharmacy Records  diltiazem  (TIADYLT  ER) 360 MG 24 hr capsule [id number]  TAKE ONE (1) CAPSULE BY MOUTH ONCE DAILY *NEW PRESCRIPTION REQUEST* Branch, Dorn FALCON, MD  Active Pharmacy Records, Self, Child  EPINEPHrine  0.3 mg/0.3 mL IJ SOAJ injection [id number]  Inject 0.3 mg into the muscle once as needed for anaphylaxis.  Patient not taking: Reported on 11/24/2023   [provider]  Active Pharmacy Records, Self, Child           Med Note LESLY, RICHERD GRADE Debar Nov 24, 2023  2:05 PM) Check for expiration date  fluconazole  (DIFLUCAN ) 100 MG tablet [id number]  Take 100 mg by mouth daily. [provider]  Active Self, Child, Pharmacy Records  folic acid  (FOLVITE ) 1 MG tablet [id number]  Take 1 tablet (1 mg total) by mouth daily. Davonna Siad, MD  Active Pharmacy Records, Self, Child  KLOR-CON  M20 20 MEQ tablet [id number]  Take 1 tablet (20 mEq total) by mouth daily as needed. Pt takes as needed when she has cramps [name], FNP  Active Self, Child, Pharmacy Records  lidocaine -prilocaine  (EMLA ) cream [id number]  APPLY TOPICALLY TO AFFECTED AREAS ONCE DAILY  Patient taking differently: Apply 1 Application topically See admin instructions. As needed for port   Sherrod Sherrod, MD  Active Self, Pharmacy Records, Child  linaclotide  (LINZESS ) 72 MCG capsule [id number]  Take  1 capsule (72 mcg total) by mouth daily before breakfast.  Patient not taking: Reported on 11/24/2023   Shirlean Therisa ORN, NP  Active Pharmacy Records, Self, Child           Med Note LESLY, RICHERD CINDERELLA Debar Nov 24, 2023  2:03 PM) Only as needed  magnesium  oxide  (MAG-OX) 400 (240 Mg) MG tablet [id number]  Take 1 tablet (400 mg total) by mouth 2 (two) times daily. Heilingoetter, Cassandra L, PA-C  Active Self, Child, Pharmacy Records  metoprolol  tartrate (LOPRESSOR ) 25 MG tablet [id number]  Take 0.5 tablets (12.5 mg total) by mouth 2 (two) times daily as needed. Miriam Norris, NP  Active   montelukast  (SINGULAIR ) 10 MG tablet [id number]  Take 10 mg by mouth at bedtime. [provider]  Active Pharmacy Records, Self, Child  JESSE SCHLOSSMAN [id number]  Pt uses a cpap nightly [provider]  Active Pharmacy Records, Self, Child  ondansetron  (ZOFRAN ) 8 MG tablet [id number]  Take 1 tablet (8 mg total) by mouth every 8 (eight) hours as needed for nausea or vomiting. Lue Elsie BROCKS, MD  Active   polyethylene glycol (MIRALAX ) 17 g packet [id number]  Take 17 g by mouth 2 (two) times daily. Lue Elsie BROCKS, MD  Active            Med Note LESLY, RICHERD CINDERELLA Debar Nov 24, 2023  2:01 PM) Need to pick up  prochlorperazine  (COMPAZINE ) 10 MG tablet [id number]  Take 1 tablet (10 mg total) by mouth every 6 (six) hours as needed. Heilingoetter, Cassandra L, PA-C  Active Self, Child, Pharmacy Records  RESTASIS  0.05 % ophthalmic emulsion [id number]  Place 1 drop into both eyes daily as needed (dry eyes). [provider]  Active Pharmacy Records, Self, Child  rivaroxaban  (XARELTO ) 20 MG TABS tablet [id number]  Take 1 tablet (20 mg total) by mouth daily. Alvan Dorn FALCON, MD  Active Pharmacy Records, Self, Child  rosuvastatin  (CRESTOR ) 20 MG tablet [id number]  Take 1 tablet (20 mg total) by mouth daily. [name], FNP  Active Self, Child, Pharmacy Records  SENEXON-S 8.6-50 MG tablet [id number]  Take 1 tablet by mouth 2 (two) times daily. Lue Elsie BROCKS, MD  Active   SYNTHROID  25 MCG tablet [id number]  Take 1 tablet (25 mcg total) by mouth daily before breakfast. [name], FNP  Active Self, Child, Pharmacy Records             Home Care and Equipment/Supplies: Were Home Health Services Ordered?: No Any new equipment or medical supplies ordered?: No  Functional Questionnaire: Do you need assistance with bathing/showering or dressing?: No Do you need assistance with meal preparation?: Yes Do you need assistance with eating?: No Do you have difficulty maintaining continence: No Do you need assistance with getting out of bed/getting out of a chair/moving?: Yes (today I was pretty weak) Do you have difficulty managing or taking your medications?: No  Follow up appointments reviewed: PCP Follow-up appointment confirmed?: Yes Date of PCP follow-up appointment?: 12/08/23 Follow-up Provider: [name], FNP - [facility] Specialist Hospital Follow-up appointment confirmed?: Yes Date of Specialist follow-up appointment?: 11/24/23 Follow-Up Specialty Provider:: Cardiology Do you need transportation to your follow-up appointment?: No Do you understand care options if your condition(s) worsen?: Yes-patient verbalized understanding  SDOH Interventions Today    Flowsheet Row Most Recent Value  SDOH Interventions   Food Insecurity Interventions Intervention Not Indicated  Housing Interventions Intervention Not Indicated  Transportation Interventions Intervention Not Indicated  Utilities Interventions Intervention Not Indicated  Depression Interventions/Treatment  --  [I don't need anything like that, I feel like as long as I get to OPPT rehab, it really boost me up]    Goals Addressed             This Visit's Progress    [facility] Transitions of Care (TOC) Care Plan       Problems:  Recent Hospitalization for treatment of Colitis - Patient is per DC Summary - is a 63 y.o. 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, atrial fibrillation, hypertension, chronic anemia, hypothyroidism was referred to the ER by oncology office after patient's labs showed  hypokalemia -imaging concerning for colitis.  Knowledge Deficit Related to Medication changes, reviewed with DC AVS and changes with Cardiology appointment 11/24/23   Goal:  Over the next 30 days, the patient will not experience hospital readmission  Interventions:  Transitions of Care: 11/24/23 Doctor Visits  - discussed the importance of doctor visits Reviewed DC instructions with patient: Call MD for: difficulty breathing, headache or visual disturbances Call MD for: extreme fatigue Call MD for: hives Call MD for: persistant dizziness or light-headedness Call MD for: persistant nausea and vomiting Call MD for: severe uncontrolled pain Call MD for: temperature >100.4 Diet - low sodium heart healthy Increase activity slowly  Patient Self Care Activities:  Attend all scheduled provider appointments Call pharmacy for medication refills 3-7 days in advance of running out of medications Call provider office for new concerns or questions  Notify RN Care Manager of TOC call rescheduling needs Participate in Transition of Care Program/Attend TOC scheduled calls Take medications as prescribed    Plan:  The care management team will reach out to the patient again over the next 5 -10 business days. The patient has been provided with contact information for the care management team and has been advised to call with any health related questions or concerns.  Patient is scheduled for TOC follow up on December 02, 2023 at 1300 with [name], RN  11/24/23 Discussed and offered 30 day TOC program.  Patient agrees to weekly calls.  The patient has been provided with contact information for the care management team and has been advised to call with any health -related questions or concerns.  The patient verbalized understanding with current plan of care.  The patient is directed to their insurance card regarding availability of benefits coverage.           Richerd Fish, RN, BSN,  CCM [facility], [facility] Management Coordinator Direct Dial: [phone]

## 2023-11-24 NOTE — Patient Instructions (Addendum)
 Medication Instructions:  Your physician has recommended you make the following change in your medication:  Start taking Metoprolol  Tartrate 12.5 mg twice daily as needed for palpitations  Continue taking all other medications as prescribed   Labwork: None  Testing/Procedures: Your physician has recommended that you wear a Zio monitor.   This monitor is a medical device that records the heart's electrical activity. Doctors most often use these monitors to diagnose arrhythmias. Arrhythmias are problems with the speed or rhythm of the heartbeat. The monitor is a small device applied to your chest. You can wear one while you do your normal daily activities. While wearing this monitor if you have any symptoms to push the button and record what you felt. Once you have worn this monitor for the period of time provider prescribed (for 7 days), you will return the monitor device in the postage paid box. Once it is returned they will download the data collected and provide us  with a report which the provider will then review and we will call you with those results. Important tips:  Avoid showering during the first 24 hours of wearing the monitor. Avoid excessive sweating to help maximize wear time. Do not submerge the device, no hot tubs, and no swimming pools. Keep any lotions or oils away from the patch. After 24 hours you may shower with the patch on. Take brief showers with your back facing the shower head.  Do not remove patch once it has been placed because that will interrupt data and decrease adhesive wear time. Push the button when you have any symptoms and write down what you were feeling. Once you have completed wearing your monitor, remove and place into box which has postage paid and place in your outgoing mailbox.  If for some reason you have misplaced your box then call our office and we can provide another box and/or mail it off for you.   Follow-Up: Your physician recommends that you  schedule a follow-up appointment in: 6-8 weeks  Any Other Special Instructions Will Be Listed Below (If Applicable).  If you need a refill on your cardiac medications before your next appointment, please call your pharmacy.

## 2023-11-24 NOTE — Progress Notes (Unsigned)
 Cardiology Office Note:  .   Date: 11/24/2023 ID:  Becky [name], DOB 1960-10-13, MRN [id number] PCP: Becky Mustard, FNP  [facility] Providers Cardiologist:  Becky Carrier, MD Electrophysiologist:  Becky ONEIDA HOLTS, MD  PV Cardiologist:  Becky Bergamo, MD    History of Present Illness: .   Becky [name] is a 63 y.o. female with a PMH of chest pain, PAF, HTN, HLD, shortness of breath/DOE, OSA, cough, dizziness, mild to moderate AI, CKD, and leg edema/leg pains, who presents today for 4-6 week follow-up.   Last seen by Dr. Carrier Becky on March 25, 2022.  At that time, she noticed some increased symptoms of A-fib during some previous extended issues with respiratory tract infections, palpitations were resolving.  At that time, she noted some recurrent GI bleeding and was instructed to hold Xarelto  for 3 days and resume if her issues resolved.  At that time, she had a GI appointment the following week.  Recommended to that may need to reconsider Watchman, she has started evaluation.  ABIs were ordered due to her reported leg pains.  ED visit on April 05, 2023 for rectal bleeding. Fecal occult was positive.  H&H: 11.1/33.9.  Was discharged from ED with GI follow-up.  She was off her Xarelto  for 3 days as instructed.  Hospitalized early April 2025 due to complaints of numbness along right hand, numbness along left toes, also noted intermittent episodes of dizziness, headache, and blurred vision.  She also continues to note bloody stools.  No melena noted.  Xarelto  was held and was noted the last time she took medication was on March 27, 2023.  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  Noted to have nonbleeding internal hemorrhoids, some diverticulosis, recommended to have repeat colonoscopy in 5 years and remain on MiraLAX  daily.  Per GI, she was  okay to resume OAC.  ED visit April 13, 2023 for dizziness.  Stated the symptoms felt similar before previous hospitalization when diagnosed with a stroke.  Reported being compliant with Xarelto .  Noted intermittent blurry vision and dizziness.  CT of the head was negative for anything acute.  Blood work was unremarkable.  Contacted our office on April 18 noting intermittent lightheadedness, noting labile heart rates, self decreased metoprolol  tartrate to 25 mg twice daily, seemed to help her blood pressure and heart rate from dropping so much.  Denied syncope. Becky Qua, PA-C recommended to further reduce Lopressor  to 12.5 mg BID.   05/06/2023 - Today she presents for scheduled follow-up.  She states she is doing better than previously.  Tells me she had 3 days of some bleeding noticed after her colonoscopy, then the bleeding stopped.  She denies any recent recurrent GI bleeding.  Admits to some lower SBP readings, confirms SBP ranging from 95- 118.  Notices heart rate dropping at nighttime, 40's at times even after lowering Lopressor  to 12.5 mg BID, HR on average is between 53-63 bpm.  She is compliant with her CPAP usage.  Admits to some lightheadedness and dizziness triggered by lights and stimuli from her phone.  Says she has been diagnosed with vertigo in the past, has tried medicine for vertigo previously.  Currently taking olmesartan  20 mg daily. Denies any chest pain, shortness of breath, palpitations, syncope, presyncope, orthopnea, PND, swelling or significant weight changes, or claudication.  06/22/2023 -  Here for follow-up. BP readings are  labile per her report. She tells me of her new diagnosis of lung cancer, coping well. Will be seeing her oncologist on June 30, 2023. Does admit to some nausea with her chemotherapy. Does admit to intermittent dizziness, some improved compared to last office visit. Continues to note lightheadedness. Denies any chest pain, shortness of breath,  palpitations, syncope, presyncope, orthopnea, PND, swelling or significant weight changes, acute bleeding, or claudication.  Recently hospitalized d/t acute sepsis r/t diverticulits with contained performatin, tx with ABX and d/c on ABX. Recent suspected viral URI. Returned with recurrent fever without source. EKG showed rate controlled A-fib.   09/22/2023 - Here for follow-up. She admits to feeling tired and intermittent lightheadedness, now taking 40 mg of olmesartan  daily when she was previously taking 20 mg of olmesartan  daily. Denies any chest pain, shortness of breath, palpitations, syncope, presyncope, dizziness, orthopnea, PND, swelling or significant weight changes, acute bleeding, or claudication.  10/27/2023 - Here for follow-up with her daughter.  Admits to lower BP readings with SBP dropping to 76, 83 at times.  Patient feels more tired since last office visit. Denies any chest pain, shortness of breath, palpitations, syncope, presyncope, dizziness, orthopnea, PND, swelling or significant weight changes, acute bleeding, or claudication. She has also noticed coughing up some phlegm, denies any fever/nausea/vomiting/chills.  11/24/2023 - Here for follow-up. Admits to some weakness, poor appetite d/t the chemo. Admits to occasional, intermittent sharp chest pains along her right lower chest, brief in duration, denies any specific triggers. Hasn't taken her medications prior to coming to office visit. Most recent BP at home was 139/97 per daughter's report. Admits to DOE with walking. Admits to labile HR's, Occasional sensation of fast hear rates/palpitations noticed over the past several weeks. Denies any syncope, presyncope, dizziness, orthopnea, PND, swelling or significant weight changes, acute bleeding, or claudication.  ROS: Negative. See HPI.   Studies Reviewed: SABRA    EKG:  EKG Interpretation Date/Time:  Tuesday November 24 2023 11:50:41 EST Ventricular Rate:  122 PR Interval:    QRS  Duration:  102 QT Interval:  350 QTC Calculation: 498 R Axis:   53  Text Interpretation: Atrial fibrillation with rapid ventricular response with premature ventricular or aberrantly conducted complexes When compared with ECG of 17-Nov-2023 18:34, PREVIOUS ECG IS PRESENT Confirmed by Miriam Norris [phone]) on 11/26/2023 11:52:01 AM    Echo bubble study limited 07/2023:   1. Left ventricular ejection fraction, by estimation, is 55 to 60%. The  left ventricle has normal function. The left ventricle has no regional  wall motion abnormalities.   2. Right ventricular systolic function is normal. The right ventricular  size is normal.   3. The mitral valve is normal in structure. No evidence of mitral valve  regurgitation. No evidence of mitral stenosis.   4. The aortic valve was not well visualized. Aortic valve regurgitation  is moderate. No aortic stenosis is present.   5. Agitated saline contrast bubble study was negative, with no evidence  of any interatrial shunt.   6. Limited echo   Echo complete bubble study 07/2023:   1. Left ventricular ejection fraction, by estimation, is 55 to 60%. The  left ventricle has normal function. The left ventricle has no regional  wall motion abnormalities. There is mild left ventricular hypertrophy.  Left ventricular diastolic parameters  are indeterminate.   2. Right ventricular systolic function was not well visualized. The right  ventricular size is not well visualized.   3. The mitral valve is normal  in structure. No evidence of mitral valve  regurgitation. No evidence of mitral stenosis.   4. Cannot rule out mobile echodensity on the non coronary cusp. The  aortic valve is tricuspid. There is moderate calcification of the aortic  valve. Aortic valve regurgitation is moderate. Aortic valve sclerosis is  present, with no evidence of aortic  valve stenosis.   5. The inferior vena cava is dilated in size with >50% respiratory  variability,  suggesting right atrial pressure of 8 mmHg.   6. Agitated saline contrast bubble study was negative, with no evidence  of any interatrial shunt.   Arterial duplex 06/2023:  Summary:  Right: Atherosclerotic plaque noted throughout the left lower extremity  with dampened monphasic doppler waveforms starting at the level of the  popliteal artery. May suggest hemodynamically significant stenosis at the  popliteal level.  Right ABI mildly reduced at 0.75   Left: Left: Atherosclerotic plaque noted throughout the left lower  extremity with dampened monphasic doppler waveforms starting at the level  of the popliteal artery. May suggest hemodynamically significant stenosis  at the popliteal artery level.  Left ABI moderately reduced at 0.55.     See table(s) above for measurements and observations.   Suggest follow up study in 12 months.  Suggest Peripheral Vascular Consult.   ABI's 06/2023:  Summary:  Right: Resting right ankle-brachial index indicates moderate right lower  extremity arterial disease. The right toe-brachial index is abnormal.   Left: Resting left ankle-brachial index indicates moderate left lower  extremity arterial disease. The left toe-brachial index is abnormal.    *See table(s) above for measurements and observations.    Suggest follow up study in 12 months.   ABI's 04/28/2023:  Summary:  Right: Resting right ankle-brachial index indicates moderate right lower  extremity arterial disease. The right toe-brachial index is abnormal.   Left: Resting left ankle-brachial index indicates moderate left lower  extremity arterial disease. The left toe-brachial index is abnormal.    *See table(s) above for measurements and observations.    Suggest follow up study in 12 months.  Echo 04/2023:  1. Left ventricular ejection fraction, by estimation, is 60 to 65%. The  left ventricle has normal function. The left ventricle has no regional  wall motion abnormalities. Left  ventricular diastolic parameters are  consistent with Grade II diastolic  dysfunction (pseudonormalization). Elevated left atrial pressure.   2. Right ventricular systolic function is normal. The right ventricular  size is normal. Tricuspid regurgitation signal is inadequate for assessing  PA pressure.   3. Left atrial size was mildly dilated.   4. The mitral valve is normal in structure. Trivial mitral valve  regurgitation. No evidence of mitral stenosis.   5. The aortic valve is tricuspid. Aortic valve regurgitation is mild to  moderate. No aortic stenosis is present.   6. The inferior vena cava is dilated in size with >50% respiratory  variability, suggesting right atrial pressure of 8 mmHg.  CT cardiac 03/2021:  IMPRESSION: 1. The left atrial appendage is a large chicken wing morphology without thrombus.   2. A 27 mm Watchman FLX device is recommended based on the above landing zone measurements (20.0 mm maximum diameter; 26% compression).   3. There is no thrombus in the left atrial appendage.   4. A mid/mid IAS puncture site is recommended.   5. Optimal deployment angle: RAO 1 CRA 23   6. Normal coronary origin. Right dominance. CAC score of 148, which is 94th percentile for age-, sex-,  and race-matched controls.  LHC 07/2019:    Prox RCA to Mid RCA lesion is 25% stenosed. Prox LAD to Mid LAD lesion is 25% stenosed. The left ventricular systolic function is normal. The left ventricular ejection fraction is 55-65% by visual estimate. LV end diastolic pressure is normal. LVEDP 15 mm Hg. There is no aortic valve stenosis.   Continue medical therapy.     Of note, patient was in AFib during the cath.  Rate was controlled and she did not feel palpitations.    Physical Exam:   VS:  BP (!) 162/98 (BP Location: Left Arm)   Pulse 78   Ht 5' 7 (1.702 m)   Wt 217 lb 12.8 oz (98.8 kg)   SpO2 98%   BMI 34.11 kg/m    Wt Readings from Last 3 Encounters:  11/24/23 217 lb  12.8 oz (98.8 kg)  11/17/23 222 lb 14.2 oz (101.1 kg)  11/17/23 224 lb 9 oz (101.9 kg)    GEN: Obese, 63 y.o. female in no acute distress NECK: No JVD; No carotid bruits CARDIAC: S1/S2, fast rate and irregular rhythm, no murmurs, rubs, gallops RESPIRATORY:  Clear to auscultation without rales, wheezing or rhonchi  ABDOMEN: Soft, non-tender, non-distended EXTREMITIES:  No edema; No deformity   ASSESSMENT AND PLAN: .   PAF, palpitations/tachycardia, hx of bradycardia Does admit to some recent tachycardia/palpitations. HR is fast today, see EKG above. Hasn't taken her AM medications yet. No more GI bleeding per her report. Continue diltiazem . Instructed her to take Lopressor  12.5 mg BID PRN for palpitations. Will also arrange 1 week Zio XT monitor. Continue Xarelto  for stroke prevention. On appropriate dosage. Denies any recent bleeding issues. Heart healthy diet and regular cardiovascular exercise encouraged.   HTN Hx of BP dropping. BP elevated today and hasn't taken BP medications today, SBP recently in 130's. Will start Lopressor  12.5 mg BID PRN for palpitations as noted above. No other med changes at this time. Discussed to monitor BP at home at least 2 hours after medications and sitting for 5-10 minutes. Heart healthy diet and regular cardiovascular exercise encouraged.   HLD Most recent LDL 93. Continue rosuvastatin . Heart healthy diet and regular cardiovascular exercise encouraged.   Valvular insufficiency Mod AR noted on most recent Echo, no interatrial shunting noted on bubble study. Denies any symptoms. Plan to update Echo in 2 years or sooner if clinically indicated.   CKD stage 3a Most recent labs show stable kidney disease. Avoid nephrotoxic agents. Continue current medication regimen. Continue to follow with PCP.  6. Weakness Etiology multifactorial. Continue to follow with PCP. Arranging monitor as noted above.    Dispo: Follow-up with Dr. Dorn Ross or APP in 6-8  weeks or sooner if anything changes.   Signed, Almarie Crate, NP

## 2023-11-24 NOTE — Telephone Encounter (Signed)
 Checking percert on the following   7 day Zio Monitor Dx: Palpitations

## 2023-11-24 NOTE — Patient Instructions (Signed)
 Visit Information  Thank you for taking time to visit with me today. Please don't hesitate to contact me if I can be of assistance to you before our next scheduled telephone appointment.  Our next appointment is by telephone on 12/02/23 at 13:00 with [name], RN  Following is a copy of your care plan:   Goals Addressed             This Visit's Progress    VBCI Transitions of Care (TOC) Care Plan       Problems:  Recent Hospitalization for treatment of Colitis - Patient is per DC Summary - is a 63 y.o. 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  Knowledge Deficit Related to Medication changes, reviewed with DC AVS and changes with Cardiology appointment 11/24/23   Goal:  Over the next 30 days, the patient will not experience hospital readmission  Interventions:  Transitions of Care: 11/24/23 Doctor Visits  - discussed the importance of doctor visits Reviewed DC instructions with patient: Call MD for: difficulty breathing, headache or visual disturbances Call MD for: extreme fatigue Call MD for: hives Call MD for: persistant dizziness or light-headedness Call MD for: persistant nausea and vomiting Call MD for: severe uncontrolled pain Call MD for: temperature >100.4 Diet - low sodium heart healthy Increase activity slowly  Patient Self Care Activities:  Attend all scheduled provider appointments Call pharmacy for medication refills 3-7 days in advance of running out of medications Call provider office for new concerns or questions  Notify RN Care Manager of TOC call rescheduling needs Participate in Transition of Care Program/Attend TOC scheduled calls Take medications as prescribed    Plan:  The care management team will reach out to the patient  again over the next 5 -10 business days. The patient has been provided with contact information for the care management team and has been advised to call with any health related questions or concerns.  Patient is scheduled for TOC follow up on December 02, 2023 at 1300 with [name], RN  11/24/23 Discussed and offered 30 day TOC program.  Patient agrees to weekly calls.  The patient has been provided with contact information for the care management team and has been advised to call with any health -related questions or concerns.  The patient verbalized understanding with current plan of care.  The patient is directed to their insurance card regarding availability of benefits coverage.          Patient verbalizes understanding of instructions and care plan provided today and agrees to view in MyChart. Active MyChart status and patient understanding of how to access instructions and care plan via MyChart confirmed with patient.     Telephone follow up appointment with care management team member scheduled for: The patient has been provided with contact information for the care management team and has been advised to call with any health related questions or concerns.   Please call the care guide team at [phone] if you need to cancel or reschedule your appointment.   Please call the [place]  National Suicide Prevention Lifeline: 407-355-3968 or TTY: 626-314-2746 TTY (470) 576-5435) to talk to a trained counselor call 1-800-273-TALK (toll free, 24 hour hotline) call 911 if you are experiencing a Mental Health or Behavioral Health Crisis or need someone to talk to.  Becky Fish, RN,  BSN, CCM [place]  [facility], [facility] Management Coordinator Direct Dial: [phone]

## 2023-11-24 NOTE — Transitions of Care (Post Inpatient/ED Visit) (Signed)
   11/24/2023  Name: Becky [name] MRN: [id number] DOB: July 19, 1960  Today's TOC FU Call Status: Today's TOC FU Call Status:: Unsuccessful Call (2nd Attempt)  Attempted to reach the patient regarding the most recent Inpatient/ED visit.  Follow Up Plan: Additional outreach attempts will be made to reach the patient to complete the Transitions of Care (Post Inpatient/ED visit) call.   Richerd Fish, RN, BSN, CCM [facility], [facility] Management Coordinator Direct Dial: [phone]

## 2023-11-25 ENCOUNTER — Other Ambulatory Visit: Payer: Self-pay | Admitting: Physician Assistant

## 2023-11-25 ENCOUNTER — Inpatient Hospital Stay

## 2023-11-25 DIAGNOSIS — E860 Dehydration: Secondary | ICD-10-CM

## 2023-11-25 DIAGNOSIS — M6281 Muscle weakness (generalized): Secondary | ICD-10-CM | POA: Diagnosis present

## 2023-11-25 DIAGNOSIS — E876 Hypokalemia: Secondary | ICD-10-CM

## 2023-11-25 DIAGNOSIS — Z7409 Other reduced mobility: Secondary | ICD-10-CM | POA: Diagnosis present

## 2023-11-25 DIAGNOSIS — D649 Anemia, unspecified: Secondary | ICD-10-CM

## 2023-11-25 DIAGNOSIS — R29898 Other symptoms and signs involving the musculoskeletal system: Secondary | ICD-10-CM | POA: Diagnosis present

## 2023-11-25 DIAGNOSIS — C3491 Malignant neoplasm of unspecified part of right bronchus or lung: Secondary | ICD-10-CM

## 2023-11-25 LAB — CBC WITH DIFFERENTIAL (CANCER CENTER ONLY)
Abs Immature Granulocytes: 0.25 K/uL — ABNORMAL HIGH (ref 0.00–0.07)
Basophils Absolute: 0 K/uL (ref 0.0–0.1)
Basophils Relative: 0 %
Eosinophils Absolute: 0.1 K/uL (ref 0.0–0.5)
Eosinophils Relative: 2 %
HCT: 26.4 % — ABNORMAL LOW (ref 36.0–46.0)
Hemoglobin: 9.3 g/dL — ABNORMAL LOW (ref 12.0–15.0)
Immature Granulocytes: 5 %
Lymphocytes Relative: 20 %
Lymphs Abs: 1 K/uL (ref 0.7–4.0)
MCH: 30.8 pg (ref 26.0–34.0)
MCHC: 35.2 g/dL (ref 30.0–36.0)
MCV: 87.4 fL (ref 80.0–100.0)
Monocytes Absolute: 1 K/uL (ref 0.1–1.0)
Monocytes Relative: 21 %
Neutro Abs: 2.6 K/uL (ref 1.7–7.7)
Neutrophils Relative %: 52 %
Platelet Count: 171 K/uL (ref 150–400)
RBC: 3.02 MIL/uL — ABNORMAL LOW (ref 3.87–5.11)
RDW: 13.5 % (ref 11.5–15.5)
WBC Count: 5 K/uL (ref 4.0–10.5)
nRBC: 0 % (ref 0.0–0.2)

## 2023-11-25 LAB — CMP (CANCER CENTER ONLY)
ALT: 43 U/L (ref 0–44)
AST: 35 U/L (ref 15–41)
Albumin: 4.1 g/dL (ref 3.5–5.0)
Alkaline Phosphatase: 90 U/L (ref 38–126)
Anion gap: 15 (ref 5–15)
BUN: 8 mg/dL (ref 8–23)
CO2: 26 mmol/L (ref 22–32)
Calcium: 10.3 mg/dL (ref 8.9–10.3)
Chloride: 97 mmol/L — ABNORMAL LOW (ref 98–111)
Creatinine: 1.25 mg/dL — ABNORMAL HIGH (ref 0.44–1.00)
GFR, Estimated: 48 mL/min — ABNORMAL LOW (ref >60)
Glucose, Bld: 129 mg/dL — ABNORMAL HIGH (ref 70–99)
Potassium: 3.1 mmol/L — ABNORMAL LOW (ref 3.5–5.1)
Sodium: 138 mmol/L (ref 135–145)
Total Bilirubin: 0.4 mg/dL (ref 0.0–1.2)
Total Protein: 7.1 g/dL (ref 6.5–8.1)

## 2023-11-25 LAB — SAMPLE TO BLOOD BANK

## 2023-11-25 LAB — MAGNESIUM: Magnesium: 1.4 mg/dL — ABNORMAL LOW (ref 1.7–2.4)

## 2023-11-25 MED ORDER — SODIUM CHLORIDE 0.9 % IV SOLN: INTRAVENOUS | Status: DC

## 2023-11-25 MED ORDER — POTASSIUM CHLORIDE CRYS ER 20 MEQ PO TBCR
20.0000 meq | EXTENDED_RELEASE_TABLET | Freq: Once | ORAL | Status: AC
  Administered 2023-11-25: 20 meq via ORAL
  Filled 2023-11-25: qty 1

## 2023-11-25 MED ORDER — POTASSIUM CHLORIDE CRYS ER 20 MEQ PO TBCR: 20.0000 meq | EXTENDED_RELEASE_TABLET | Freq: Two times a day (BID) | ORAL | 0 refills | Status: DC

## 2023-11-25 MED ORDER — MAGNESIUM SULFATE 2 GM/50ML IV SOLN
2.0000 g | Freq: Once | INTRAVENOUS | Status: AC
  Administered 2023-11-25: 2 g via INTRAVENOUS
  Filled 2023-11-25: qty 50

## 2023-11-25 NOTE — Patient Instructions (Signed)
 Fluids Given Through an IV (IV Infusion Therapy): What to Expect IV infusion therapy is a treatment to deliver a fluid, called an infusion, into a vein. You may have IV infusion to get: Fluids. Medicines. Nutrition. Chemotherapy. This is medicines to stop or slow down cancer cells. Blood or blood products. Dye that is given before an MRI or a CT scan. This is called contrast dye. Tell a health care provider about: Any allergies you have. This includes allergies to anesthesia or dyes. All medicines you take. These include vitamins, herbs, eye drops, and creams. Any bleeding problems you have. Any surgeries you've had, including if you've had lymph nodes taken out of your armpit or if you have a arteriovenous fistula for dialysis. Any medical problems you have. Whether you're pregnant or may be pregnant. Whether you've used IV drugs. What are the risks? Your health care provider will talk with you about risks. These may include: Pain, bruising, or bleeding. Infection. The IV leaking or moving out of place. Damage to blood vessels or nerves. Allergic reactions to medicines or dyes. A blood clot. An air bubble in the vein, also called an air embolism. What happens before the procedure? Eat and drink only as you've been told. Ask about changing or stopping: Any medicines you take. Any vitamins, herbs, or supplements you take. What happens during the procedure?     Placing the catheter Your skin at the IV site will be washed with fluid that kills germs. This will help prevent infection. IV infusion therapy starts with a procedure to place a soft tube called a catheter into a vein. An IV tube will be attached to the catheter to let the infusion flow into your blood. Your catheter may be placed: Into a vein that is usually in the bend of the elbow, forearm, or back of the hand. This is called a peripheral IV catheter. This may need to be put into a vein each time you get an  infusion. Into a vein near your elbow. This is called a midline catheter or a peripherally inserted central catheter (PICC). These types of catheters may stay in place for weeks or months at a time so you can receive repeated infusions through it. Into a vein near your neck that leads to your heart. This is called a non-tunneled catheter. This is only used for short amounts of time because it can cause infection. Through the skin of your chest and into a large vein that leads to your heart. This is called a tunneled catheter. This may stay in your body for months or years. Into an implanted port. An implanted port is a device that is surgically inserted under the skin of the chest to provide long-term IV access. The catheter will connect the port to a large vein in the chest or upper arm. A port may be kept in place for many months or years. Each time you have an infusion, a needle will be inserted through your skin to connect the catheter to the port. Doing the infusion To start the infusion, your provider will: Attach the IV tubing to your catheter. Use a tape or a bandage to hold the IV in place against your skin. An IV pump may be used to control the flow of the IV infusion. During the infusion, your provider will check the area to make sure: There is no bleeding, swelling, or pain. Your IV infusion is flowing correctly. After the infusion, your provider will: Take off the bandage  or tape. Disconnect the tubing from the catheter. Remove the catheter, if you have a peripheral IV. Apply pressure over the IV insertion site to stop bleeding, then cover the area with a bandage. If you have an implanted port, PICC, non-tunneled, or tunneled catheter, your catheter may remain in place. This depends on how many times you will need treatment, your medical condition, and what type of catheter you have. These steps may vary. Ask what you can expect. What can I expect after the procedure? You may be  watched closely until you leave. This includes checking your pain level, blood pressure, heart rate, and breathing rate. Your provider will check to make sure there are no signs of infection. Follow these instructions at home: Take your medicines only as told. Change or take off your bandage as told by your provider. Ask what things are safe for you to do at home. Ask when you can go back to work or school. Do not take baths, swim, or use a hot tub until you're told it's OK. Ask if you can shower. Check your IV insertion site every day for signs of infection. Check for: Redness, swelling, or pain. Fluid or blood. If fluid or blood drains from your IV site, use your hands to press down firmly on the area for a minute or two. Doing this should stop the bleeding. Warmth. Pus or a bad smell. Contact a health care provider if: You have signs of infection around your IV site. You have fluid or blood coming from your IV site that does not stop after you put pressure to the site. You have a rash or blisters. You have itchy, red, swollen areas of skin called hives. Get help right away if: You have a fever or chills. You have chest pain. You have trouble breathing. This information is not intended to replace advice given to you by your health care provider. Make sure you discuss any questions you have with your health care provider. Document Revised: 06/17/2022 Document Reviewed: 06/17/2022 Elsevier Patient Education  2024 [employer]  Magnesium  Sulfate Injection What is this medication? MAGNESIUM  SULFATE (mag NEE zee um SUL fate) prevents and treats low levels of magnesium  in your body. It may also be used to prevent and treat seizures during pregnancy in people with high blood pressure disorders, such as preeclampsia or eclampsia. Magnesium  plays an important role in maintaining the health of your muscles and nervous system. This medicine may be used for other purposes; ask your health care  provider or pharmacist if you have questions. What should I tell my care team before I take this medication? They need to know if you have any of these conditions: Heart disease History of irregular heart beat Kidney disease An unusual or allergic reaction to magnesium  sulfate, medications, foods, dyes, or preservatives Pregnant or trying to get pregnant Breast-feeding How should I use this medication? This medication is for infusion into a vein. It is given in a hospital or clinic setting. Talk to your care team about the use of this medication in children. While this medication may be prescribed for selected conditions, precautions do apply. Overdosage: If you think you have taken too much of this medicine contact a poison control center or emergency room at once. NOTE: This medicine is only for you. Do not share this medicine with others. What if I miss a dose? This does not apply. What may interact with this medication? Certain medications for anxiety or sleep Certain medications for  seizures, such phenobarbital Digoxin Medications that relax muscles for surgery Narcotic medications for pain This list may not describe all possible interactions. Give your health care provider a list of all the medicines, herbs, non-prescription drugs, or dietary supplements you use. Also tell them if you smoke, drink alcohol, or use illegal drugs. Some items may interact with your medicine. What should I watch for while using this medication? Your condition will be monitored carefully while you are receiving this medication. You may need blood work done while you are receiving this medication. What side effects may I notice from receiving this medication? Side effects that you should report to your care team as soon as possible: Allergic reactions--skin rash, itching, hives, swelling of the face, lips, tongue, or throat High magnesium  level--confusion, drowsiness, facial flushing, redness, sweating,  muscle weakness, fast or irregular heartbeat, trouble breathing Low blood pressure--dizziness, feeling faint or lightheaded, blurry vision Side effects that usually do not require medical attention (report to your care team if they continue or are bothersome): Headache Nausea This list may not describe all possible side effects. Call your doctor for medical advice about side effects. You may report side effects to FDA at 1-800-FDA-1088. Where should I keep my medication? This medication is given in a hospital or clinic and will not be stored at home. NOTE: This sheet is a summary. It may not cover all possible information. If you have questions about this medicine, talk to your doctor, pharmacist, or health care provider.  2024 Elsevier/Gold Standard (2020-09-05 00:00:00)  Hypokalemia Hypokalemia means that the amount of potassium in the blood is lower than normal. Potassium is a mineral (electrolyte) that helps regulate the amount of fluid in the body. It also stimulates muscle tightening (contraction) and helps nerves work properly. Normally, most of the body's potassium is inside cells, and only a very small amount is in the blood. Because the amount in the blood is so small, minor changes to potassium levels in the blood can be life-threatening. What are the causes? This condition may be caused by: Antibiotic medicine. Diarrhea or vomiting. Taking too much of a medicine that helps you have a bowel movement (laxative) can cause diarrhea and lead to hypokalemia. Chronic kidney disease (CKD). Medicines that help the body get rid of excess fluid (diuretics). Eating disorders, such as anorexia or bulimia. Low magnesium  levels in the body. Sweating a lot. What are the signs or symptoms? Symptoms of this condition include: Weakness. Constipation. Fatigue. Muscle cramps. Mental confusion. Skipped heartbeats or irregular heartbeat (palpitations). Tingling or numbness. How is this  diagnosed? This condition is diagnosed with a blood test. How is this treated? This condition may be treated by: Taking potassium supplements. Adjusting the medicines that you take. Eating more foods that contain a lot of potassium. If your potassium level is very low, you may need to get potassium through an IV and be monitored in the hospital. Follow these instructions at home: Eating and drinking  Eat a healthy diet. A healthy diet includes fresh fruits and vegetables, whole grains, healthy fats, and lean proteins. If told, eat more foods that contain a lot of potassium. These include: Nuts, such as peanuts and pistachios. Seeds, such as sunflower seeds and pumpkin seeds. Peas, lentils, and lima beans. Whole grain and bran cereals and breads. Fresh fruits and vegetables, such as apricots, avocado, bananas, cantaloupe, kiwi, oranges, tomatoes, asparagus, and potatoes. Juices, such as orange, tomato, and prune. Lean meats, including fish. Milk and milk products, such as  yogurt. General instructions Take over-the-counter and prescription medicines only as told by your health care provider. This includes vitamins, natural food products, and supplements. Keep all follow-up visits. This is important. Contact a health care provider if: You have weakness that gets worse. You feel your heart pounding or racing. You vomit. You have diarrhea. You have diabetes and you have trouble keeping your blood sugar in your target range. Get help right away if: You have chest pain. You have shortness of breath. You have vomiting or diarrhea that lasts for more than 2 days. You faint. These symptoms may be an emergency. Get help right away. Call 911. Do not wait to see if the symptoms will go away. Do not drive yourself to the hospital. Summary Hypokalemia means that the amount of potassium in the blood is lower than normal. This condition is diagnosed with a blood test. Hypokalemia may be  treated by taking potassium supplements, adjusting the medicines that you take, or eating more foods that are high in potassium. If your potassium level is very low, you may need to get potassium through an IV and be monitored in the hospital. This information is not intended to replace advice given to you by your health care provider. Make sure you discuss any questions you have with your health care provider. Document Revised: 09/06/2020 Document Reviewed: 09/06/2020 Elsevier Patient Education  2024 [employer].

## 2023-11-26 ENCOUNTER — Encounter (HOSPITAL_COMMUNITY)

## 2023-11-30 ENCOUNTER — Ambulatory Visit (HOSPITAL_COMMUNITY)

## 2023-11-30 ENCOUNTER — Encounter (HOSPITAL_COMMUNITY): Payer: Self-pay

## 2023-11-30 DIAGNOSIS — R29898 Other symptoms and signs involving the musculoskeletal system: Secondary | ICD-10-CM

## 2023-11-30 DIAGNOSIS — M6281 Muscle weakness (generalized): Secondary | ICD-10-CM

## 2023-11-30 DIAGNOSIS — Z7409 Other reduced mobility: Secondary | ICD-10-CM
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
## 2023-12-01 ENCOUNTER — Inpatient Hospital Stay

## 2023-12-01 ENCOUNTER — Inpatient Hospital Stay: Admitting: Dietician

## 2023-12-01 ENCOUNTER — Inpatient Hospital Stay: Admitting: Internal Medicine

## 2023-12-01 VITALS — BP 155/98 | HR 98 | Resp 16

## 2023-12-01 VITALS — BP 108/89 | HR 73 | Temp 97.7°F | Resp 17 | Ht 67.0 in | Wt 217.0 lb

## 2023-12-01 DIAGNOSIS — C3491 Malignant neoplasm of unspecified part of right bronchus or lung: Secondary | ICD-10-CM

## 2023-12-01 DIAGNOSIS — Z7409 Other reduced mobility: Secondary | ICD-10-CM | POA: Diagnosis not present

## 2023-12-01 DIAGNOSIS — Z95828 Presence of other vascular implants and grafts: Secondary | ICD-10-CM

## 2023-12-01 LAB — CMP (CANCER CENTER ONLY)
ALT: 19 U/L (ref 0–44)
AST: 27 U/L (ref 15–41)
Albumin: 4.1 g/dL (ref 3.5–5.0)
Alkaline Phosphatase: 80 U/L (ref 38–126)
Anion gap: 13 (ref 5–15)
BUN: 6 mg/dL — ABNORMAL LOW (ref 8–23)
CO2: 25 mmol/L (ref 22–32)
Calcium: 10.3 mg/dL (ref 8.9–10.3)
Chloride: 103 mmol/L (ref 98–111)
Creatinine: 1.3 mg/dL — ABNORMAL HIGH (ref 0.44–1.00)
GFR, Estimated: 46 mL/min — ABNORMAL LOW (ref >60)
Glucose, Bld: 141 mg/dL — ABNORMAL HIGH (ref 70–99)
Potassium: 3.6 mmol/L (ref 3.5–5.1)
Sodium: 140 mmol/L (ref 135–145)
Total Bilirubin: 0.4 mg/dL (ref 0.0–1.2)
Total Protein: 7 g/dL (ref 6.5–8.1)

## 2023-12-01 LAB — CBC WITH DIFFERENTIAL (CANCER CENTER ONLY)
Abs Immature Granulocytes: 0.05 K/uL (ref 0.00–0.07)
Basophils Absolute: 0 K/uL (ref 0.0–0.1)
Basophils Relative: 0 %
Eosinophils Absolute: 0.1 K/uL (ref 0.0–0.5)
Eosinophils Relative: 2 %
HCT: 26.7 % — ABNORMAL LOW (ref 36.0–46.0)
Hemoglobin: 9 g/dL — ABNORMAL LOW (ref 12.0–15.0)
Immature Granulocytes: 1 %
Lymphocytes Relative: 12 %
Lymphs Abs: 0.8 K/uL (ref 0.7–4.0)
MCH: 30.4 pg (ref 26.0–34.0)
MCHC: 33.7 g/dL (ref 30.0–36.0)
MCV: 90.2 fL (ref 80.0–100.0)
Monocytes Absolute: 1.1 K/uL — ABNORMAL HIGH (ref 0.1–1.0)
Monocytes Relative: 16 %
Neutro Abs: 4.7 K/uL (ref 1.7–7.7)
Neutrophils Relative %: 69 %
Platelet Count: 413 K/uL — ABNORMAL HIGH (ref 150–400)
RBC: 2.96 MIL/uL — ABNORMAL LOW (ref 3.87–5.11)
RDW: 14.2 % (ref 11.5–15.5)
WBC Count: 6.7 K/uL (ref 4.0–10.5)
nRBC: 0 % (ref 0.0–0.2)

## 2023-12-01 LAB — MAGNESIUM: Magnesium: 1.4 mg/dL — ABNORMAL LOW (ref 1.7–2.4)

## 2023-12-01 MED ORDER — SODIUM CHLORIDE 0.9 % IV SOLN
200.0000 mg | Freq: Once | INTRAVENOUS | Status: AC
  Administered 2023-12-01: 200 mg via INTRAVENOUS
  Filled 2023-12-01: qty 200

## 2023-12-01 MED ORDER — SODIUM CHLORIDE 0.9% FLUSH
10.0000 mL | Freq: Once | INTRAVENOUS | Status: AC
  Administered 2023-12-01: 10 mL

## 2023-12-01 MED ORDER — SODIUM CHLORIDE 0.9 % IV SOLN
400.0000 mg/m2 | Freq: Once | INTRAVENOUS | Status: AC
  Administered 2023-12-01: 900 mg via INTRAVENOUS
  Filled 2023-12-01: qty 20

## 2023-12-01 MED ORDER — SODIUM CHLORIDE 0.9% FLUSH: 10.0000 mL | INTRAVENOUS | Status: DC | PRN

## 2023-12-01 MED ORDER — MAGNESIUM SULFATE 4 GM/100ML IV SOLN
4.0000 g | Freq: Once | INTRAVENOUS | Status: AC
  Administered 2023-12-01: 4 g via INTRAVENOUS
  Filled 2023-12-01: qty 100

## 2023-12-01 MED ORDER — DEXAMETHASONE SOD PHOSPHATE PF 10 MG/ML IJ SOLN
10.0000 mg | Freq: Once | INTRAMUSCULAR | Status: AC
  Administered 2023-12-01: 10 mg via INTRAVENOUS

## 2023-12-01 MED ORDER — PALONOSETRON HCL INJECTION 0.25 MG/5ML
0.2500 mg | Freq: Once | INTRAVENOUS | Status: AC
  Administered 2023-12-01: 0.25 mg via INTRAVENOUS
  Filled 2023-12-01: qty 5

## 2023-12-01 MED ORDER — SODIUM CHLORIDE 0.9 % IV SOLN
412.4000 mg | Freq: Once | INTRAVENOUS | Status: AC
  Administered 2023-12-01: 410 mg via INTRAVENOUS
  Filled 2023-12-01: qty 41

## 2023-12-01 MED ORDER — SODIUM CHLORIDE 0.9 % IV SOLN: INTRAVENOUS | Status: DC

## 2023-12-01 MED ORDER — APREPITANT 130 MG/18ML IV EMUL
130.0000 mg | Freq: Once | INTRAVENOUS | Status: AC
  Administered 2023-12-01: 130 mg via INTRAVENOUS
  Filled 2023-12-01: qty 18

## 2023-12-01 NOTE — Progress Notes (Signed)
 Nutrition Follow-up:  Patient with stage IV non-small cell lung cancer. Receiving chemotherapy and keytruda .   11/11-11/16 - admission with colitis/hypokalemia   Met with patient and son in infusion. She reports ongoing poor appetite secondary to altered taste. She has tried baking soda rinses. Did not add salt. States that it taste like fish. Son is supportive and encouraging of oral intake at home. She tried unjury powder, however this did not dissolve well. Said it was chunky and unable to tolerate. She does fairly well with fruit smoothies + pedialyte. Tried adding protein powder to this, however it made it too thick. Patient does not like anything milky as it increases phlegm. Patient becomes tearful. Says she is trying.    Medications: reviewed   Labs: glucose 141, BUN 6, Cr 1.30  Anthropometrics: Wt 217 lb today decreased 4% in 3 weeks (hospitalization)  11/3 - 226 lb  10/13 - 232 lb    NUTRITION DIAGNOSIS: Inadequate oral intake continues    INTERVENTION:  Reviewed strategies for taste changes - suggested adding the salt to baking soda/water  and swishing prior to po - handout with recipe provided Discussed strategies for poor appetite - encourage po within first hour of waking followed by bites/sips q2h (snack ideas provided) Suggested soft moist foods and using straw for ease of intake - handout provided Provided samples of KF 1.4 plain for pt to try - suggested adding to smoothie if unable to drink as is Bowel regimen per MD  Support and encouragement   MONITORING, EVALUATION, GOAL: wt trends, intake    NEXT VISIT: Tuesday December 16 during infusion

## 2023-12-01 NOTE — Patient Instructions (Signed)
 [facility] - A DEPT OF [facility]  Discharge Instructions: Thank you for choosing [facility] to provide your oncology and hematology care.   If you have a lab appointment with the Cancer Center, please go directly to the Cancer Center and check in at the registration area.   Wear comfortable clothing and clothing appropriate for easy access to any Portacath or PICC line.   We strive to give you quality time with your provider. You may need to reschedule your appointment if you arrive late (15 or more minutes).  Arriving late affects you and other patients whose appointments are after yours.  Also, if you miss three or more appointments without notifying the office, you may be dismissed from the clinic at the provider's discretion.      For prescription refill requests, have your pharmacy contact our office and allow 72 hours for refills to be completed.    Today you received the following chemotherapy and/or immunotherapy agents: Keytruda , Alimta , Carboplatin , Magnesium       To help prevent nausea and vomiting after your treatment, we encourage you to take your nausea medication as directed.  BELOW ARE SYMPTOMS THAT SHOULD BE REPORTED IMMEDIATELY: *FEVER GREATER THAN 100.4 F (38 C) OR HIGHER *CHILLS OR SWEATING *NAUSEA AND VOMITING THAT IS NOT CONTROLLED WITH YOUR NAUSEA MEDICATION *UNUSUAL SHORTNESS OF BREATH *UNUSUAL BRUISING OR BLEEDING *URINARY PROBLEMS (pain or burning when urinating, or frequent urination) *BOWEL PROBLEMS (unusual diarrhea, constipation, pain near the anus) TENDERNESS IN MOUTH AND THROAT WITH OR WITHOUT PRESENCE OF ULCERS (sore throat, sores in mouth, or a toothache) UNUSUAL RASH, SWELLING OR PAIN  UNUSUAL VAGINAL DISCHARGE OR ITCHING   Items with * indicate a potential emergency and should be followed up as soon as possible or go to the Emergency Department if any problems should occur.  Please show the  CHEMOTHERAPY ALERT CARD or IMMUNOTHERAPY ALERT CARD at check-in to the Emergency Department and triage nurse.  Should you have questions after your visit or need to cancel or reschedule your appointment, please contact [facility] - A DEPT OF [facility]  Dept: [phone]  and follow the prompts.  Office hours are 8:00 a.m. to 4:30 p.m. Monday - Friday. Please note that voicemails left after 4:00 p.m. may not be returned until the following business day.  We are closed weekends and major holidays. You have access to a nurse at all times for urgent questions. Please call the main number to the clinic Dept: [phone] and follow the prompts.   For any non-urgent questions, you may also contact your provider using MyChart. We now offer e-Visits for anyone 60 and older to request care online for non-urgent symptoms. For details visit [email or web address].   Also download the MyChart app! Go to the app store, search MyChart, open the app, select [place], and log in with your MyChart username and password.  Pembrolizumab  Injection What is this medication? PEMBROLIZUMAB  (PEM broe LIZ ue mab) treats some types of cancer. It works by helping your immune system slow or stop the spread of cancer cells. It is a monoclonal antibody. This medicine may be used for other purposes; ask your health care provider or pharmacist if you have questions. COMMON BRAND NAME(S): Keytruda  What should I tell my care team before I take this medication? They need to know if you have any of these conditions: Allogeneic stem cell transplant (uses someone  else's stem cells) Autoimmune diseases, such as Crohn disease, ulcerative colitis, lupus History of chest radiation Nervous system problems, such as Guillain-Barre syndrome, myasthenia gravis Organ transplant An unusual or allergic reaction to pembrolizumab , other medications, foods, dyes, or preservatives Pregnant or trying to get  pregnant Breast-feeding How should I use this medication? This medication is injected into a vein. It is given by your care team in a hospital or clinic setting. A special MedGuide will be given to you before each treatment. Be sure to read this information carefully each time. Talk to your care team about the use of this medication in children. While it may be prescribed for children as young as 6 months for selected conditions, precautions do apply. Overdosage: If you think you have taken too much of this medicine contact a poison control center or emergency room at once. NOTE: This medicine is only for you. Do not share this medicine with others. What if I miss a dose? Keep appointments for follow-up doses. It is important not to miss your dose. Call your care team if you are unable to keep an appointment. What may interact with this medication? Interactions have not been studied. This list may not describe all possible interactions. Give your health care provider a list of all the medicines, herbs, non-prescription drugs, or dietary supplements you use. Also tell them if you smoke, drink alcohol, or use illegal drugs. Some items may interact with your medicine. What should I watch for while using this medication? Your condition will be monitored carefully while you are receiving this medication. You may need blood work while taking this medication. This medication may cause serious skin reactions. They can happen weeks to months after starting the medication. Contact your care team right away if you notice fevers or flu-like symptoms with a rash. The rash may be red or purple and then turn into blisters or peeling of the skin. You may also notice a red rash with swelling of the face, lips, or lymph nodes in your neck or under your arms. Tell your care team right away if you have any change in your eyesight. Talk to your care team if you may be pregnant. Serious birth defects can occur if you  take this medication during pregnancy and for 4 months after the last dose. You will need a negative pregnancy test before starting this medication. Contraception is recommended while taking this medication and for 4 months after the last dose. Your care team can help you find the option that works for you. Do not breastfeed while taking this medication and for 4 months after the last dose. What side effects may I notice from receiving this medication? Side effects that you should report to your care team as soon as possible: Allergic reactions--skin rash, itching, hives, swelling of the face, lips, tongue, or throat Dry cough, shortness of breath or trouble breathing Eye pain, redness, irritation, or discharge with blurry or decreased vision Heart muscle inflammation--unusual weakness or fatigue, shortness of breath, chest pain, fast or irregular heartbeat, dizziness, swelling of the ankles, feet, or hands Hormone gland problems--headache, sensitivity to light, unusual weakness or fatigue, dizziness, fast or irregular heartbeat, increased sensitivity to cold or heat, excessive sweating, constipation, hair loss, increased thirst or amount of urine, tremors or shaking, irritability Infusion reactions--chest pain, shortness of breath or trouble breathing, feeling faint or lightheaded Kidney injury (glomerulonephritis)--decrease in the amount of urine, red or dark brown urine, foamy or bubbly urine, swelling of the ankles,  hands, or feet Liver injury--right upper belly pain, loss of appetite, nausea, light-colored stool, dark yellow or brown urine, yellowing skin or eyes, unusual weakness or fatigue Pain, tingling, or numbness in the hands or feet, muscle weakness, change in vision, confusion or trouble speaking, loss of balance or coordination, trouble walking, seizures Rash, fever, and swollen lymph nodes Redness, blistering, peeling, or loosening of the skin, including inside the mouth Sudden or  severe stomach pain, bloody diarrhea, fever, nausea, vomiting Side effects that usually do not require medical attention (report to your care team if they continue or are bothersome): Bone, joint, or muscle pain Diarrhea Fatigue Loss of appetite Nausea Skin rash This list may not describe all possible side effects. Call your doctor for medical advice about side effects. You may report side effects to FDA at 1-800-FDA-1088. Where should I keep my medication? This medication is given in a hospital or clinic. It will not be stored at home. NOTE: This sheet is a summary. It may not cover all possible information. If you have questions about this medicine, talk to your doctor, pharmacist, or health care provider.  2024 Elsevier/Gold Standard (2021-05-07 00:00:00)  Pemetrexed  Injection What is this medication? PEMETREXED  (PEM e TREX ed) treats some types of cancer. It works by slowing down the growth of cancer cells. This medicine may be used for other purposes; ask your health care provider or pharmacist if you have questions. COMMON BRAND NAME(S): Alimta , PEMFEXY, PEMRYDI RTU What should I tell my care team before I take this medication? They need to know if you have any of these conditions: Infection, such as chickenpox, cold sores, or herpes Kidney disease Low blood cell levels (white cells, red cells, and platelets) Lung or breathing disease, such as asthma Radiation therapy An unusual or allergic reaction to pemetrexed , other medications, foods, dyes, or preservatives If you or your partner are pregnant or trying to get pregnant Breast-feeding How should I use this medication? This medication is injected into a vein. It is given by your care team in a hospital or clinic setting. Talk to your care team about the use of this medication in children. Special care may be needed. Overdosage: If you think you have taken too much of this medicine contact a poison control center or emergency  room at once. NOTE: This medicine is only for you. Do not share this medicine with others. What if I miss a dose? Keep appointments for follow-up doses. It is important not to miss your dose. Call your care team if you are unable to keep an appointment. What may interact with this medication? Do not take this medication with any of the following: Live virus vaccines This medication may also interact with the following: Ibuprofen  This list may not describe all possible interactions. Give your health care provider a list of all the medicines, herbs, non-prescription drugs, or dietary supplements you use. Also tell them if you smoke, drink alcohol, or use illegal drugs. Some items may interact with your medicine. What should I watch for while using this medication? Your condition will be monitored carefully while you are receiving this medication. This medication may make you feel generally unwell. This is not uncommon as chemotherapy can affect healthy cells as well as cancer cells. Report any side effects. Continue your course of treatment even though you feel ill unless your care team tells you to stop. This medication can cause serious side effects. To reduce the risk, your care team may give you other  medications to take before receiving this one. Be sure to follow the directions from your care team. This medication can cause a rash or redness in areas of the body that have previously had radiation therapy. If you have had radiation therapy, tell your care team if you notice a rash in this area. This medication may increase your risk of getting an infection. Call your care team for advice if you get a fever, chills, sore throat, or other symptoms of a cold or flu. Do not treat yourself. Try to avoid being around people who are sick. Be careful brushing or flossing your teeth or using a toothpick because you may get an infection or bleed more easily. If you have any dental work done, tell your  dentist you are receiving this medication. Avoid taking medications that contain aspirin , acetaminophen , ibuprofen , naproxen , or ketoprofen unless instructed by your care team. These medications may hide a fever. Check with your care team if you have severe diarrhea, nausea, and vomiting, or if you sweat a lot. The loss of too much body fluid may make it dangerous for you to take this medication. Talk to your care team if you or your partner wish to become pregnant or think either of you might be pregnant. This medication can cause serious birth defects if taken during pregnancy and for 6 months after the last dose. A negative pregnancy test is required before starting this medication. A reliable form of contraception is recommended while taking this medication and for 6 months after the last dose. Talk to your care team about reliable forms of contraception. Do not father a child while taking this medication and for 3 months after the last dose. Use a condom while having sex during this time period. Do not breastfeed while taking this medication and for 1 week after the last dose. This medication may cause infertility. Talk to your care team if you are concerned about your fertility. What side effects may I notice from receiving this medication? Side effects that you should report to your care team as soon as possible: Allergic reactions--skin rash, itching, hives, swelling of the face, lips, tongue, or throat Dry cough, shortness of breath or trouble breathing Infection--fever, chills, cough, sore throat, wounds that don't heal, pain or trouble when passing urine, general feeling of discomfort or being unwell Kidney injury--decrease in the amount of urine, swelling of the ankles, hands, or feet Low red blood cell level--unusual weakness or fatigue, dizziness, headache, trouble breathing Redness, blistering, peeling, or loosening of the skin, including inside the mouth Unusual bruising or  bleeding Side effects that usually do not require medical attention (report to your care team if they continue or are bothersome): Fatigue Loss of appetite Nausea Vomiting This list may not describe all possible side effects. Call your doctor for medical advice about side effects. You may report side effects to FDA at 1-800-FDA-1088. Where should I keep my medication? This medication is given in a hospital or clinic. It will not be stored at home. NOTE: This sheet is a summary. It may not cover all possible information. If you have questions about this medicine, talk to your doctor, pharmacist, or health care provider.  2024 Elsevier/Gold Standard (2021-04-30 00:00:00)  Carboplatin  Injection What is this medication? CARBOPLATIN  (KAR boe pla tin) treats some types of cancer. It works by slowing down the growth of cancer cells. This medicine may be used for other purposes; ask your health care provider or pharmacist if you have questions.  COMMON BRAND NAME(S): Paraplatin  What should I tell my care team before I take this medication? They need to know if you have any of these conditions: Blood disorders Hearing problems Kidney disease Recent or ongoing radiation therapy An unusual or allergic reaction to carboplatin , cisplatin, other medications, foods, dyes, or preservatives Pregnant or trying to get pregnant Breast-feeding How should I use this medication? This medication is injected into a vein. It is given by your care team in a hospital or clinic setting. Talk to your care team about the use of this medication in children. Special care may be needed. Overdosage: If you think you have taken too much of this medicine contact a poison control center or emergency room at once. NOTE: This medicine is only for you. Do not share this medicine with others. What if I miss a dose? Keep appointments for follow-up doses. It is important not to miss your dose. Call your care team if you are  unable to keep an appointment. What may interact with this medication? Medications for seizures Some antibiotics, such as amikacin, gentamicin, neomycin, streptomycin, tobramycin Vaccines This list may not describe all possible interactions. Give your health care provider a list of all the medicines, herbs, non-prescription drugs, or dietary supplements you use. Also tell them if you smoke, drink alcohol, or use illegal drugs. Some items may interact with your medicine. What should I watch for while using this medication? Your condition will be monitored carefully while you are receiving this medication. You may need blood work while taking this medication. This medication may make you feel generally unwell. This is not uncommon, as chemotherapy can affect healthy cells as well as cancer cells. Report any side effects. Continue your course of treatment even though you feel ill unless your care team tells you to stop. In some cases, you may be given additional medications to help with side effects. Follow all directions for their use. This medication may increase your risk of getting an infection. Call your care team for advice if you get a fever, chills, sore throat, or other symptoms of a cold or flu. Do not treat yourself. Try to avoid being around people who are sick. Avoid taking medications that contain aspirin , acetaminophen , ibuprofen , naproxen , or ketoprofen unless instructed by your care team. These medications may hide a fever. Be careful brushing or flossing your teeth or using a toothpick because you may get an infection or bleed more easily. If you have any dental work done, tell your dentist you are receiving this medication. Talk to your care team if you wish to become pregnant or think you might be pregnant. This medication can cause serious birth defects. Talk to your care team about effective forms of contraception. Do not breast-feed while taking this medication. What side effects  may I notice from receiving this medication? Side effects that you should report to your care team as soon as possible: Allergic reactions--skin rash, itching, hives, swelling of the face, lips, tongue, or throat Infection--fever, chills, cough, sore throat, wounds that don't heal, pain or trouble when passing urine, general feeling of discomfort or being unwell Low red blood cell level--unusual weakness or fatigue, dizziness, headache, trouble breathing Pain, tingling, or numbness in the hands or feet, muscle weakness, change in vision, confusion or trouble speaking, loss of balance or coordination, trouble walking, seizures Unusual bruising or bleeding Side effects that usually do not require medical attention (report to your care team if they continue or are bothersome): Hair loss  Nausea Unusual weakness or fatigue Vomiting This list may not describe all possible side effects. Call your doctor for medical advice about side effects. You may report side effects to FDA at 1-800-FDA-1088. Where should I keep my medication? This medication is given in a hospital or clinic. It will not be stored at home. NOTE: This sheet is a summary. It may not cover all possible information. If you have questions about this medicine, talk to your doctor, pharmacist, or health care provider.  2024 Elsevier/Gold Standard (2021-04-16 00:00:00)  Magnesium  Sulfate Injection What is this medication? MAGNESIUM  SULFATE (mag NEE zee um SUL fate) prevents and treats low levels of magnesium  in your body. It may also be used to prevent and treat seizures during pregnancy in people with high blood pressure disorders, such as preeclampsia or eclampsia. Magnesium  plays an important role in maintaining the health of your muscles and nervous system. This medicine may be used for other purposes; ask your health care provider or pharmacist if you have questions. What should I tell my care team before I take this medication? They  need to know if you have any of these conditions: Heart disease History of irregular heart beat Kidney disease An unusual or allergic reaction to magnesium  sulfate, medications, foods, dyes, or preservatives Pregnant or trying to get pregnant Breast-feeding How should I use this medication? This medication is for infusion into a vein. It is given in a hospital or clinic setting. Talk to your care team about the use of this medication in children. While this medication may be prescribed for selected conditions, precautions do apply. Overdosage: If you think you have taken too much of this medicine contact a poison control center or emergency room at once. NOTE: This medicine is only for you. Do not share this medicine with others. What if I miss a dose? This does not apply. What may interact with this medication? Certain medications for anxiety or sleep Certain medications for seizures, such phenobarbital Digoxin Medications that relax muscles for surgery Narcotic medications for pain This list may not describe all possible interactions. Give your health care provider a list of all the medicines, herbs, non-prescription drugs, or dietary supplements you use. Also tell them if you smoke, drink alcohol, or use illegal drugs. Some items may interact with your medicine. What should I watch for while using this medication? Your condition will be monitored carefully while you are receiving this medication. You may need blood work done while you are receiving this medication. What side effects may I notice from receiving this medication? Side effects that you should report to your care team as soon as possible: Allergic reactions--skin rash, itching, hives, swelling of the face, lips, tongue, or throat High magnesium  level--confusion, drowsiness, facial flushing, redness, sweating, muscle weakness, fast or irregular heartbeat, trouble breathing Low blood pressure--dizziness, feeling faint or  lightheaded, blurry vision Side effects that usually do not require medical attention (report to your care team if they continue or are bothersome): Headache Nausea This list may not describe all possible side effects. Call your doctor for medical advice about side effects. You may report side effects to FDA at 1-800-FDA-1088. Where should I keep my medication? This medication is given in a hospital or clinic and will not be stored at home. NOTE: This sheet is a summary. It may not cover all possible information. If you have questions about this medicine, talk to your doctor, pharmacist, or health care provider.  2024 Elsevier/Gold Standard (2020-09-05 00:00:00)
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
## 2023-12-02 ENCOUNTER — Telehealth: Payer: Self-pay | Admitting: *Deleted

## 2023-12-02 ENCOUNTER — Encounter (HOSPITAL_COMMUNITY)

## 2023-12-02 ENCOUNTER — Encounter: Payer: Self-pay | Admitting: *Deleted

## 2023-12-07 ENCOUNTER — Inpatient Hospital Stay (HOSPITAL_COMMUNITY)
Admission: EM | Admit: 2023-12-07 | Discharge: 2023-12-10 | DRG: 378 | Disposition: A | Discharge status: Home / Self Care | Attending: Family Medicine | Admitting: Family Medicine

## 2023-12-07 ENCOUNTER — Emergency Department (HOSPITAL_COMMUNITY)

## 2023-12-07 ENCOUNTER — Other Ambulatory Visit: Payer: Self-pay

## 2023-12-07 ENCOUNTER — Encounter (HOSPITAL_COMMUNITY)

## 2023-12-07 ENCOUNTER — Encounter (HOSPITAL_COMMUNITY): Payer: Self-pay

## 2023-12-07 DIAGNOSIS — Z95828 Presence of other vascular implants and grafts: Secondary | ICD-10-CM

## 2023-12-07 DIAGNOSIS — D62 Acute posthemorrhagic anemia: Secondary | ICD-10-CM | POA: Diagnosis present

## 2023-12-07 DIAGNOSIS — C3490 Malignant neoplasm of unspecified part of unspecified bronchus or lung: Secondary | ICD-10-CM | POA: Diagnosis not present

## 2023-12-07 DIAGNOSIS — I482 Chronic atrial fibrillation, unspecified: Secondary | ICD-10-CM | POA: Diagnosis present

## 2023-12-07 DIAGNOSIS — G43909 Migraine, unspecified, not intractable, without status migrainosus: Secondary | ICD-10-CM | POA: Diagnosis present

## 2023-12-07 DIAGNOSIS — Z96651 Presence of right artificial knee joint: Secondary | ICD-10-CM | POA: Diagnosis present

## 2023-12-07 DIAGNOSIS — K922 Gastrointestinal hemorrhage, unspecified: Principal | ICD-10-CM | POA: Diagnosis present

## 2023-12-07 DIAGNOSIS — Z91040 Latex allergy status: Secondary | ICD-10-CM

## 2023-12-07 DIAGNOSIS — C3491 Malignant neoplasm of unspecified part of right bronchus or lung: Secondary | ICD-10-CM | POA: Diagnosis present

## 2023-12-07 DIAGNOSIS — C781 Secondary malignant neoplasm of mediastinum: Secondary | ICD-10-CM | POA: Diagnosis present

## 2023-12-07 DIAGNOSIS — D631 Anemia in chronic kidney disease: Secondary | ICD-10-CM

## 2023-12-07 DIAGNOSIS — Z7901 Long term (current) use of anticoagulants: Secondary | ICD-10-CM

## 2023-12-07 DIAGNOSIS — G4730 Sleep apnea, unspecified: Secondary | ICD-10-CM | POA: Diagnosis present

## 2023-12-07 DIAGNOSIS — J45909 Unspecified asthma, uncomplicated: Secondary | ICD-10-CM | POA: Diagnosis present

## 2023-12-07 DIAGNOSIS — K08409 Partial loss of teeth, unspecified cause, unspecified class: Secondary | ICD-10-CM | POA: Diagnosis present

## 2023-12-07 DIAGNOSIS — Z91048 Other nonmedicinal substance allergy status: Secondary | ICD-10-CM

## 2023-12-07 DIAGNOSIS — Z6833 Body mass index (BMI) 33.0-33.9, adult: Secondary | ICD-10-CM

## 2023-12-07 DIAGNOSIS — K5731 Diverticulosis of large intestine without perforation or abscess with bleeding: Principal | ICD-10-CM | POA: Diagnosis present

## 2023-12-07 DIAGNOSIS — R933 Abnormal findings on diagnostic imaging of other parts of digestive tract: Secondary | ICD-10-CM

## 2023-12-07 DIAGNOSIS — D6859 Other primary thrombophilia: Secondary | ICD-10-CM | POA: Diagnosis present

## 2023-12-07 DIAGNOSIS — Z888 Allergy status to other drugs, medicaments and biological substances status: Secondary | ICD-10-CM

## 2023-12-07 DIAGNOSIS — N1831 Chronic kidney disease, stage 3a: Secondary | ICD-10-CM | POA: Diagnosis not present

## 2023-12-07 DIAGNOSIS — C7931 Secondary malignant neoplasm of brain: Secondary | ICD-10-CM | POA: Diagnosis present

## 2023-12-07 DIAGNOSIS — Z9851 Tubal ligation status: Secondary | ICD-10-CM

## 2023-12-07 DIAGNOSIS — Z8673 Personal history of transient ischemic attack (TIA), and cerebral infarction without residual deficits: Secondary | ICD-10-CM

## 2023-12-07 DIAGNOSIS — D649 Anemia, unspecified: Secondary | ICD-10-CM

## 2023-12-07 DIAGNOSIS — E669 Obesity, unspecified: Secondary | ICD-10-CM | POA: Diagnosis present

## 2023-12-07 DIAGNOSIS — N189 Chronic kidney disease, unspecified: Secondary | ICD-10-CM

## 2023-12-07 DIAGNOSIS — Z833 Family history of diabetes mellitus: Secondary | ICD-10-CM

## 2023-12-07 DIAGNOSIS — K641 Second degree hemorrhoids: Secondary | ICD-10-CM | POA: Diagnosis present

## 2023-12-07 DIAGNOSIS — Z808 Family history of malignant neoplasm of other organs or systems: Secondary | ICD-10-CM

## 2023-12-07 DIAGNOSIS — D693 Immune thrombocytopenic purpura: Secondary | ICD-10-CM | POA: Diagnosis present

## 2023-12-07 DIAGNOSIS — D6481 Anemia due to antineoplastic chemotherapy: Secondary | ICD-10-CM | POA: Diagnosis present

## 2023-12-07 DIAGNOSIS — C7972 Secondary malignant neoplasm of left adrenal gland: Secondary | ICD-10-CM | POA: Diagnosis present

## 2023-12-07 DIAGNOSIS — T451X5A Adverse effect of antineoplastic and immunosuppressive drugs, initial encounter: Secondary | ICD-10-CM | POA: Diagnosis present

## 2023-12-07 DIAGNOSIS — D61818 Other pancytopenia: Secondary | ICD-10-CM | POA: Diagnosis present

## 2023-12-07 DIAGNOSIS — E1122 Type 2 diabetes mellitus with diabetic chronic kidney disease: Secondary | ICD-10-CM | POA: Diagnosis present

## 2023-12-07 DIAGNOSIS — Z923 Personal history of irradiation: Secondary | ICD-10-CM

## 2023-12-07 DIAGNOSIS — I2510 Atherosclerotic heart disease of native coronary artery without angina pectoris: Secondary | ICD-10-CM | POA: Diagnosis not present

## 2023-12-07 DIAGNOSIS — Z8719 Personal history of other diseases of the digestive system: Secondary | ICD-10-CM

## 2023-12-07 DIAGNOSIS — Z860102 Personal history of hyperplastic colon polyps: Secondary | ICD-10-CM

## 2023-12-07 DIAGNOSIS — K921 Melena: Secondary | ICD-10-CM | POA: Diagnosis present

## 2023-12-07 DIAGNOSIS — Z9889 Other specified postprocedural states: Secondary | ICD-10-CM

## 2023-12-07 DIAGNOSIS — M1990 Unspecified osteoarthritis, unspecified site: Secondary | ICD-10-CM | POA: Diagnosis present

## 2023-12-07 DIAGNOSIS — E876 Hypokalemia: Secondary | ICD-10-CM | POA: Diagnosis present

## 2023-12-07 DIAGNOSIS — K219 Gastro-esophageal reflux disease without esophagitis: Secondary | ICD-10-CM | POA: Diagnosis present

## 2023-12-07 DIAGNOSIS — R634 Abnormal weight loss: Secondary | ICD-10-CM | POA: Diagnosis present

## 2023-12-07 DIAGNOSIS — I129 Hypertensive chronic kidney disease with stage 1 through stage 4 chronic kidney disease, or unspecified chronic kidney disease: Secondary | ICD-10-CM | POA: Diagnosis present

## 2023-12-07 DIAGNOSIS — Z860101 Personal history of adenomatous and serrated colon polyps: Secondary | ICD-10-CM

## 2023-12-07 DIAGNOSIS — E785 Hyperlipidemia, unspecified: Secondary | ICD-10-CM | POA: Diagnosis present

## 2023-12-07 DIAGNOSIS — K769 Liver disease, unspecified: Secondary | ICD-10-CM | POA: Diagnosis present

## 2023-12-07 DIAGNOSIS — E039 Hypothyroidism, unspecified: Secondary | ICD-10-CM | POA: Diagnosis present

## 2023-12-07 DIAGNOSIS — K5909 Other constipation: Secondary | ICD-10-CM | POA: Diagnosis present

## 2023-12-07 DIAGNOSIS — Z87891 Personal history of nicotine dependence: Secondary | ICD-10-CM

## 2023-12-07 DIAGNOSIS — D701 Agranulocytosis secondary to cancer chemotherapy: Secondary | ICD-10-CM | POA: Diagnosis present

## 2023-12-07 DIAGNOSIS — Z7982 Long term (current) use of aspirin: Secondary | ICD-10-CM

## 2023-12-07 DIAGNOSIS — C7951 Secondary malignant neoplasm of bone: Secondary | ICD-10-CM | POA: Diagnosis present

## 2023-12-07 DIAGNOSIS — Z79890 Hormone replacement therapy (postmenopausal): Secondary | ICD-10-CM

## 2023-12-07 DIAGNOSIS — Z79899 Other long term (current) drug therapy: Secondary | ICD-10-CM

## 2023-12-07 DIAGNOSIS — Z8249 Family history of ischemic heart disease and other diseases of the circulatory system: Secondary | ICD-10-CM

## 2023-12-07 LAB — CBC WITH DIFFERENTIAL/PLATELET
Abs Immature Granulocytes: 0 K/uL (ref 0.00–0.07)
Basophils Absolute: 0 K/uL (ref 0.0–0.1)
Basophils Relative: 0 %
Eosinophils Absolute: 0.1 K/uL (ref 0.0–0.5)
Eosinophils Relative: 2 %
HCT: 24.9 % — ABNORMAL LOW (ref 36.0–46.0)
Hemoglobin: 8.3 g/dL — ABNORMAL LOW (ref 12.0–15.0)
Lymphocytes Relative: 18 %
Lymphs Abs: 0.5 K/uL — ABNORMAL LOW (ref 0.7–4.0)
MCH: 31.1 pg (ref 26.0–34.0)
MCHC: 33.3 g/dL (ref 30.0–36.0)
MCV: 93.3 fL (ref 80.0–100.0)
Metamyelocytes Relative: 1 %
Monocytes Absolute: 0 K/uL — ABNORMAL LOW (ref 0.1–1.0)
Monocytes Relative: 1 %
Neutro Abs: 2 K/uL (ref 1.7–7.7)
Neutrophils Relative %: 78 %
Platelets: 325 K/uL (ref 150–400)
RBC: 2.67 MIL/uL — ABNORMAL LOW (ref 3.87–5.11)
RDW: 14.4 % (ref 11.5–15.5)
WBC: 2.5 K/uL — ABNORMAL LOW (ref 4.0–10.5)
nRBC: 0 % (ref 0.0–0.2)

## 2023-12-07 LAB — URINALYSIS, ROUTINE W REFLEX MICROSCOPIC
Bilirubin Urine: NEGATIVE
Glucose, UA: NEGATIVE mg/dL
Hgb urine dipstick: NEGATIVE
Ketones, ur: NEGATIVE mg/dL
Leukocytes,Ua: NEGATIVE
Nitrite: NEGATIVE
Protein, ur: NEGATIVE mg/dL
Specific Gravity, Urine: 1.036 — ABNORMAL HIGH (ref 1.005–1.030)
pH: 6 (ref 5.0–8.0)

## 2023-12-07 LAB — COMPREHENSIVE METABOLIC PANEL WITH GFR
ALT: 25 U/L (ref 0–44)
AST: 35 U/L (ref 15–41)
Albumin: 4.1 g/dL (ref 3.5–5.0)
Alkaline Phosphatase: 71 U/L (ref 38–126)
Anion gap: 13 (ref 5–15)
BUN: 9 mg/dL (ref 8–23)
CO2: 22 mmol/L (ref 22–32)
Calcium: 10 mg/dL (ref 8.9–10.3)
Chloride: 104 mmol/L (ref 98–111)
Creatinine, Ser: 1.2 mg/dL — ABNORMAL HIGH (ref 0.44–1.00)
GFR, Estimated: 51 mL/min — ABNORMAL LOW (ref >60)
Glucose, Bld: 107 mg/dL — ABNORMAL HIGH (ref 70–99)
Potassium: 3.8 mmol/L (ref 3.5–5.1)
Sodium: 139 mmol/L (ref 135–145)
Total Bilirubin: 0.6 mg/dL (ref 0.0–1.2)
Total Protein: 6.6 g/dL (ref 6.5–8.1)

## 2023-12-07 LAB — POC OCCULT BLOOD, ED: Fecal Occult Bld: POSITIVE — AB

## 2023-12-07 MED ORDER — ROSUVASTATIN CALCIUM 20 MG PO TABS
20.0000 mg | ORAL_TABLET | Freq: Every day | ORAL | Status: DC
  Administered 2023-12-07 – 2023-12-10 (×4): 20 mg via ORAL
  Filled 2023-12-07 (×4): qty 1

## 2023-12-07 MED ORDER — IOHEXOL 350 MG/ML SOLN
100.0000 mL | Freq: Once | INTRAVENOUS | Status: AC | PRN
  Administered 2023-12-07: 100 mL via INTRAVENOUS

## 2023-12-07 MED ORDER — ONDANSETRON HCL 4 MG PO TABS: 4.0000 mg | ORAL_TABLET | Freq: Four times a day (QID) | ORAL | Status: DC | PRN

## 2023-12-07 MED ORDER — ACETAMINOPHEN 500 MG PO TABS
1000.0000 mg | ORAL_TABLET | Freq: Four times a day (QID) | ORAL | Status: DC | PRN
  Administered 2023-12-10: 1000 mg via ORAL
  Filled 2023-12-07: qty 2

## 2023-12-07 MED ORDER — ONDANSETRON HCL 4 MG/2ML IJ SOLN
4.0000 mg | Freq: Four times a day (QID) | INTRAMUSCULAR | Status: DC | PRN
  Administered 2023-12-08 – 2023-12-10 (×2): 4 mg via INTRAVENOUS
  Filled 2023-12-07 (×2): qty 2

## 2023-12-07 MED ORDER — DILTIAZEM HCL ER BEADS 240 MG PO CP24
360.0000 mg | ORAL_CAPSULE | Freq: Every day | ORAL | Status: DC
  Administered 2023-12-07 – 2023-12-10 (×4): 360 mg via ORAL
  Filled 2023-12-07 (×9): qty 1

## 2023-12-07 MED ORDER — HEPARIN (PORCINE) 25000 UT/250ML-% IV SOLN
1250.0000 [IU]/h | INTRAVENOUS | Status: DC
  Administered 2023-12-08: 1150 [IU]/h via INTRAVENOUS
  Filled 2023-12-07: qty 250

## 2023-12-07 MED ORDER — PANTOPRAZOLE SODIUM 40 MG PO TBEC
40.0000 mg | DELAYED_RELEASE_TABLET | Freq: Every day | ORAL | Status: DC
  Administered 2023-12-07 – 2023-12-10 (×4): 40 mg via ORAL
  Filled 2023-12-07 (×4): qty 1

## 2023-12-07 MED ORDER — FOLIC ACID 1 MG PO TABS
1.0000 mg | ORAL_TABLET | Freq: Every day | ORAL | Status: DC
  Administered 2023-12-07 – 2023-12-10 (×4): 1 mg via ORAL
  Filled 2023-12-07 (×4): qty 1

## 2023-12-07 MED ORDER — AZELASTINE HCL 0.1 % NA SOLN
1.0000 | Freq: Every day | NASAL | Status: DC
  Administered 2023-12-09 – 2023-12-10 (×2): 1 via NASAL
  Filled 2023-12-07: qty 30

## 2023-12-07 MED ORDER — MAGNESIUM OXIDE -MG SUPPLEMENT 400 (240 MG) MG PO TABS
400.0000 mg | ORAL_TABLET | Freq: Two times a day (BID) | ORAL | Status: DC
  Administered 2023-12-07 – 2023-12-10 (×6): 400 mg via ORAL
  Filled 2023-12-07 (×6): qty 1

## 2023-12-07 MED ORDER — LEVOTHYROXINE SODIUM 25 MCG PO TABS
25.0000 ug | ORAL_TABLET | Freq: Every day | ORAL | Status: DC
  Administered 2023-12-08 – 2023-12-10 (×3): 25 ug via ORAL
  Filled 2023-12-07 (×3): qty 1

## 2023-12-07 MED ORDER — MONTELUKAST SODIUM 10 MG PO TABS
10.0000 mg | ORAL_TABLET | Freq: Every day | ORAL | Status: DC
  Administered 2023-12-07 – 2023-12-09 (×3): 10 mg via ORAL
  Filled 2023-12-07 (×3): qty 1

## 2023-12-07 NOTE — ED Notes (Signed)
 Daughter at bedside assisting with bedside commode

## 2023-12-07 NOTE — ED Notes (Signed)
 Transport aware admit bed is ready.

## 2023-12-07 NOTE — Hospital Course (Signed)
 63 year old female with atrial fibrillation on rivaroxaban , acquired thrombophilia, asthma, coronary artery disease, history of CVA, GERD, goiter, hyperlipidemia, hypertension, hypothyroidism, diabetes mellitus, recently diagnosed with stage IV non-small cell lung cancer adenocarcinoma with metastatic disease including mediastinal adenopathy, metastatic disease to the 6 rib, brain, left adrenal gland. She completed radiation to the right rib, right lung bronchus on 07/29/2023. Systemic chemotherapy was postponed due to acute CVA which occurred in early July 2025 and then on 08/10/2023 she was found to have severe thrombocytopenia and subsequently treated for ITP with IVIG. Her platelets finally recovered and she received her first systemic chemotherapy treatment with carboplatin , Alimta , and  08/31/2023.  She has received 3 cycles of carboplatin  and Alimta .  Her last dose of chemotherapy was 07/31/2023.  Per Dr. Gatha, plan is to eventually transition to monotherapy with Keytruda  in the future.  Nevertheless, the patient developed rectal bleeding in the morning of 12/07/2023.  She denies any fevers, chills, chest pain, shortness of breath, abdominal pain.  She has some dizziness.  She denies any syncope.  There is no hematuria.  She denies any new medications.  She denies any NSAIDs. In the ED, patient was afebrile hemodynamically stable with oxygen saturation 99% room air.  WBC 2.5, hemoglobin 8.3, platelets 325.  Sodium 139, potassium 3.8, bicarbonate 22, serum creatinine 1.20.  FOBT was positive with red blood.  GI was consulted to assist with management.

## 2023-12-07 NOTE — ED Provider Notes (Signed)
 [place] EMERGENCY DEPARTMENT AT [facility] Provider Note   CSN: [id number] Arrival date & time: 12/07/23  9140     Patient presents with: Rectal Bleeding   Becky [name] is a 63 y.o. female.   Patient is a 63 year old female who presents to Emergency Department with a chief complaint of a bout of rectal bleeding this morning.  Patient notes that she is currently undergoing chemotherapy treatment with her last dose last week.  Patient notes that she has had no associated abdominal pain, nausea, vomiting, diarrhea.  She does have a history of hemorrhoids, diverticulitis and colitis.  Patient denies any fever or chills.  She has had no lightheadedness or dizziness.  She denies any chest pain, shortness of breath, palpitations.   Rectal Bleeding      Prior to Admission medications   Medication Sig Start Date End Date Taking? Authorizing Provider  acetaminophen  (TYLENOL ) 500 MG tablet Take 1,000 mg by mouth every 6 (six) hours as needed for mild pain (pain score 1-3).    [provider]  albuterol  (VENTOLIN  HFA) 108 (90 Base) MCG/ACT inhaler Inhale 2 puffs into the lungs every 4 (four) hours as needed for wheezing or shortness of breath. 12/07/21   Stuart Vernell Norris, PA-C  aspirin  EC 81 MG tablet Take 1 tablet (81 mg total) by mouth daily. Swallow whole. 07/25/23   Ricky Fines, MD  Azelastine  HCl 137 MCG/SPRAY SOLN Place 1 spray into both nostrils daily. 10/02/23   [provider]  dexlansoprazole  (DEXILANT ) 60 MG capsule Take 1 capsule (60 mg total) by mouth daily. 10/15/23 10/14/24  Cindie Carlin POUR, DO  diltiazem  (TIADYLT  ER) 360 MG 24 hr capsule TAKE ONE (1) CAPSULE BY MOUTH ONCE DAILY *NEW PRESCRIPTION REQUEST* 08/05/23   Alvan Dorn FALCON, MD  EPINEPHrine  0.3 mg/0.3 mL IJ SOAJ injection Inject 0.3 mg into the muscle once as needed for anaphylaxis. Patient not taking: Reported on 11/24/2023 04/20/18   [provider]  fluconazole   (DIFLUCAN ) 100 MG tablet Take 100 mg by mouth daily. 10/06/23   [provider]  folic acid  (FOLVITE ) 1 MG tablet Take 1 tablet (1 mg total) by mouth daily. 06/18/23   Davonna Siad, MD  KLOR-CON  M20 20 MEQ tablet Take 1 tablet (20 mEq total) by mouth daily as needed. Pt takes as needed when she has cramps 09/28/23   Gladis, Mary-Margaret, FNP  lidocaine -prilocaine  (EMLA ) cream APPLY TOPICALLY TO AFFECTED AREAS ONCE DAILY Patient taking differently: Apply 1 Application topically See admin instructions. As needed for port 09/14/23   Sherrod Sherrod, MD  linaclotide  (LINZESS ) 72 MCG capsule Take 1 capsule (72 mcg total) by mouth daily before breakfast. Patient not taking: Reported on 11/24/2023 05/05/23   Shirlean Therisa ORN, NP  magnesium  oxide (MAG-OX) 400 (240 Mg) MG tablet Take 1 tablet (400 mg total) by mouth 2 (two) times daily. 11/09/23 12/09/23  Heilingoetter, Cassandra L, PA-C  metoprolol  tartrate (LOPRESSOR ) 25 MG tablet Take 0.5 tablets (12.5 mg total) by mouth 2 (two) times daily as needed. 11/24/23   Miriam Norris, NP  montelukast  (SINGULAIR ) 10 MG tablet Take 10 mg by mouth at bedtime. 08/25/16   [provider]  NON FORMULARY Pt uses a cpap nightly    [provider]  ondansetron  (ZOFRAN ) 8 MG tablet Take 1 tablet (8 mg total) by mouth every 8 (eight) hours as needed for nausea or vomiting. 11/22/23   Lue Elsie BROCKS, MD  polyethylene glycol (MIRALAX ) 17 g packet Take 17 g  by mouth 2 (two) times daily. 11/22/23   Lue Elsie BROCKS, MD  potassium chloride  SA (KLOR-CON  M) 20 MEQ tablet Take 1 tablet (20 mEq total) by mouth 2 (two) times daily. 11/25/23   Heilingoetter, Cassandra L, PA-C  prochlorperazine  (COMPAZINE ) 10 MG tablet Take 1 tablet (10 mg total) by mouth every 6 (six) hours as needed. 11/09/23   Heilingoetter, Cassandra L, PA-C  RESTASIS  0.05 % ophthalmic emulsion Place 1 drop into both eyes daily as needed (dry eyes). 04/24/23   [provider]   rivaroxaban  (XARELTO ) 20 MG TABS tablet Take 1 tablet (20 mg total) by mouth daily. 08/24/23   Alvan Dorn FALCON, MD  rosuvastatin  (CRESTOR ) 20 MG tablet Take 1 tablet (20 mg total) by mouth daily. 09/28/23   Gladis, Mary-Margaret, FNP  SENEXON-S 8.6-50 MG tablet Take 1 tablet by mouth 2 (two) times daily. 11/22/23   Lue Elsie BROCKS, MD  SYNTHROID  25 MCG tablet Take 1 tablet (25 mcg total) by mouth daily before breakfast. 09/28/23   [name], FNP    Allergies: Apresoline  [hydralazine ], Latex, Other, and Prednisone     Review of Systems  Gastrointestinal:  Positive for blood in stool and hematochezia.  All other systems reviewed and are negative.   Updated Vital Signs BP 118/71 (BP Location: Left Arm)   Pulse 89   Temp 98.9 F (37.2 C) (Oral)   Resp 16   Ht 5' 7 (1.702 m)   Wt 98.4 kg   SpO2 100%   BMI 33.99 kg/m   Physical Exam Vitals and nursing note reviewed. Exam conducted with a chaperone present.  Constitutional:      General: She is not in acute distress.    Appearance: Normal appearance. She is not ill-appearing.  HENT:     Head: Normocephalic and atraumatic.     Nose: Nose normal.     Mouth/Throat:     Mouth: Mucous membranes are moist.  Eyes:     Extraocular Movements: Extraocular movements intact.     Conjunctiva/sclera: Conjunctivae normal.     Pupils: Pupils are equal, round, and reactive to light.  Cardiovascular:     Rate and Rhythm: Normal rate and regular rhythm.     Pulses: Normal pulses.     Heart sounds: Normal heart sounds. No murmur heard.    No gallop.  Pulmonary:     Effort: Pulmonary effort is normal. No respiratory distress.     Breath sounds: Normal breath sounds. No stridor. No wheezing, rhonchi or rales.  Abdominal:     General: Abdomen is flat. Bowel sounds are normal. There is no distension.     Palpations: Abdomen is soft.     Tenderness: There is no abdominal tenderness. There is no guarding.  Genitourinary:     Rectum: Normal. Guaiac result positive.     Comments: Small amount of blood noted in rectal vault, no hemorrhoids noted Musculoskeletal:        General: Normal range of motion.     Cervical back: Normal range of motion and neck supple.  Skin:    General: Skin is warm and dry.  Neurological:     General: No focal deficit present.     Mental Status: She is alert and oriented to person, place, and time. Mental status is at baseline.  Psychiatric:        Mood and Affect: Mood normal.        Behavior: Behavior normal.        Thought Content: Thought  content normal.        Judgment: Judgment normal.     (all labs ordered are listed, but only abnormal results are displayed) Labs Reviewed  POC OCCULT BLOOD, ED - Abnormal; Notable for the following components:      Result Value   Fecal Occult Bld POSITIVE (*)    All other components within normal limits  COMPREHENSIVE METABOLIC PANEL WITH GFR  CBC WITH DIFFERENTIAL/PLATELET  URINALYSIS, ROUTINE W REFLEX MICROSCOPIC  TYPE AND SCREEN    EKG: None  Radiology: No results found.   Procedures   Medications Ordered in the ED - No data to display                                  Medical Decision Making Amount and/or Complexity of Data Reviewed Labs: ordered. Radiology: ordered.  Risk Prescription drug management. Decision regarding hospitalization.   This patient presents to the ED for concern of rectal bleeding, this involves an extensive number of treatment options, and is a complaint that carries with it a high risk of complications and morbidity.  The differential diagnosis includes lower GI bleed, upper GI bleed, hemorrhoids, diverticulitis, colitis   Co morbidities that complicate the patient evaluation  Chronic anticoagulation, cancer   Additional history obtained:  Additional history obtained from medical records External records from outside source obtained and reviewed including medical records   Lab  Tests:  I Ordered, and personally interpreted labs.  The pertinent results include: Leukopenia, worsening anemia, creatinine at baseline, normal liver function, unremarkable electrolytes   Imaging Studies ordered:  I ordered imaging studies including CTA abdomen and pelvis I independently visualized and interpreted imaging which showed no active GI bleed, inflammation and edema throughout the transverse colon I agree with the radiologist interpretation    Consultations Obtained:  I requested consultation with the gastroenterology, Dr. Cindie,  and discussed lab and imaging findings as well as pertinent plan - they recommend: Agrees with admission, observation, repeat labs in the morning, n.p.o. after midnight   Problem List / ED Course / Critical interventions / Medication management  Patient is doing well at this time and does remain stable.  She has had no tachycardia or hypotension while in the emergency department.  Did discuss patient case with Dr. Cindie with gastroenterology who agreed to admission at this point for observation, repeat blood work in the morning and will see patient in consult.  She was Hemoccult positive with noted bright red blood per rectum on exam.  Patient has no other acute findings on blood work at this point other than mild worsening anemia and leukopenia.  She does not warrant transfusion at this point.  Have discussed patient case with Dr. Evonnie with the hospitalist service who has excepted for admission.   Social Determinants of Health:  None   Test / Admission - Considered:  Admission     Final diagnoses:  None    ED Discharge Orders     None          Daralene Lonni BIRCH, PA-C 12/07/23 1436    Simon Lavonia SAILOR, MD 12/07/23 [phone]

## 2023-12-07 NOTE — Plan of Care (Signed)

## 2023-12-07 NOTE — ED Notes (Signed)
 Attempted to collect urine but pt's hat did not catch.

## 2023-12-07 NOTE — ED Notes (Signed)
 Attempted to obtain urine again, pt states she's not able to go.

## 2023-12-07 NOTE — Consult Note (Signed)
 PHARMACY - ANTICOAGULATION CONSULT NOTE  Pharmacy Consult for Heparin  Indication: history of embolic stroke X 2 when off of Xarelto  short periods of time.  Allergies  Allergen Reactions   Wound Dressing Adhesive Other (See Comments)    Ripped skin    Apresoline  [Hydralazine ] Nausea Only   Latex Other (See Comments)    Blisters/burns/welts    Other Other (See Comments)    Band aids discolor skin EKG pads irritate skin    Prednisone  Swelling    Patient states that she can take methylprednisolone  without complications She has tolerated PO dexamethasone  well. No complaints of swelling voiced from patient.    Patient Measurements: Height: 5' 7 (170.2 cm) Weight: 98.4 kg (217 lb) IBW/kg (Calculated) : 61.6 HEPARIN  DW (KG): 83.4  Vital Signs: Temp: 98.8 F (37.1 C) (12/01 1939) Temp Source: Oral (12/01 1939) BP: 114/76 (12/01 1939) Pulse Rate: 91 (12/01 1939)  Labs: Recent Labs    12/07/23 0952  HGB 8.3*  HCT 24.9*  PLT 325  CREATININE 1.20*    Estimated Creatinine Clearance: 58.5 mL/min (A) (by C-G formula based on SCr of 1.2 mg/dL (H)).   Medical History: Past Medical History:  Diagnosis Date   Anemia    Arthritis    Asthma    Atrial fibrillation (HCC) 2018   Back pain    BV (bacterial vaginosis) 05/23/2013   Constipation 11/21/2014   Coronary artery disease    Current use of long term anticoagulation 2018   Diabetes mellitus without complication (HCC)    Dysphagia 2011   Fibroids 03/13/2016   GERD (gastroesophageal reflux disease)    Goiter 2009   Hematochezia 2011   Hematuria 05/23/2013   History of radiation therapy    Right lung, right ribs, brain- 07/08/23-07/29/23- Dr. 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 diagnosed in 11/2016 --> started on Xarelto  for anticoagulation   Pelvic pain in female 11/02/2013   Plantar fasciitis of right foot    Pre-diabetes    Sleep apnea    dont use cpap says causes sinus infection   Stroke (HCC)    04/07/23; 06/2023; 07/2023    Medications:  Medications Prior to Admission  Medication Sig Dispense Refill Last Dose/Taking   acetaminophen  (TYLENOL ) 500 MG tablet Take 1,000 mg by mouth every 6 (six) hours as needed for mild pain (pain score 1-3).   Unknown   albuterol  (VENTOLIN  HFA) 108 (90 Base) MCG/ACT inhaler Inhale 2 puffs into the lungs every 4 (four) hours as needed for wheezing or shortness of breath. 18 g 0 Unknown   aspirin  EC 81 MG tablet Take 1 tablet (81 mg total) by mouth daily. Swallow whole. 30 tablet 12 12/06/2023 Morning   Azelastine  HCl 137 MCG/SPRAY SOLN Place 1 spray into both nostrils daily.   Unknown   dexlansoprazole  (DEXILANT ) 60 MG capsule Take 1 capsule (60 mg total) by mouth daily. 90 capsule 3 12/06/2023 Morning   diltiazem  (TIADYLT  ER) 360 MG 24 hr capsule TAKE ONE (1) CAPSULE BY MOUTH ONCE DAILY *NEW PRESCRIPTION REQUEST* 90 capsule 3 12/06/2023 Evening   EPINEPHrine  0.3 mg/0.3 mL IJ SOAJ injection Inject 0.3 mg into the muscle once as needed for anaphylaxis.  Unknown   fluconazole  (DIFLUCAN ) 100 MG tablet Take 100 mg by mouth daily.   Unknown   folic acid  (FOLVITE ) 1 MG tablet Take 1 tablet (1 mg total) by mouth daily. 90 tablet 2 12/06/2023 Morning   lidocaine -prilocaine  (EMLA ) cream APPLY TOPICALLY TO AFFECTED AREAS ONCE DAILY (Patient taking differently: Apply 1 Application topically See admin instructions. As needed for port) 30 g 11 Past Week   linaclotide  (LINZESS ) 72 MCG capsule Take 1 capsule (72 mcg total) by mouth daily before breakfast. 90 capsule 3 Unknown   magnesium  oxide (MAG-OX) 400 (240 Mg) MG tablet Take 1 tablet (400 mg total) by mouth 2 (two) times daily. 30 tablet 1 12/06/2023 Evening    metoprolol  tartrate (LOPRESSOR ) 25 MG tablet Take 0.5 tablets (12.5 mg total) by mouth 2 (two) times daily as needed. (Patient taking differently: Take 12.5 mg by mouth 2 (two) times daily as needed (heart rate).)   Unknown   montelukast  (SINGULAIR ) 10 MG tablet Take 10 mg by mouth at bedtime.   12/06/2023 Bedtime   NON FORMULARY Pt uses a cpap nightly   12/06/2023 Bedtime   ondansetron  (ZOFRAN ) 8 MG tablet Take 1 tablet (8 mg total) by mouth every 8 (eight) hours as needed for nausea or vomiting. 30 tablet 1 Past Week   polyethylene glycol (MIRALAX ) 17 g packet Take 17 g by mouth 2 (two) times daily. 30 each 1 Unknown   potassium chloride  SA (KLOR-CON  M) 20 MEQ tablet Take 1 tablet (20 mEq total) by mouth 2 (two) times daily. 16 tablet 0 12/06/2023 Evening   prochlorperazine  (COMPAZINE ) 10 MG tablet Take 1 tablet (10 mg total) by mouth every 6 (six) hours as needed. 30 tablet 11 12/06/2023 Evening   RESTASIS  0.05 % ophthalmic emulsion Place 1 drop into both eyes daily as needed (dry eyes).   Unknown   rivaroxaban  (XARELTO ) 20 MG TABS tablet Take 1 tablet (20 mg total) by mouth daily. 90 tablet 1 12/06/2023 at 10:15 PM   rosuvastatin  (CRESTOR ) 20 MG tablet Take 1 tablet (20 mg total) by mouth daily. 90 tablet 3 12/06/2023 Evening   SENEXON-S 8.6-50 MG tablet Take 1 tablet by mouth 2 (two) times daily. (Patient taking differently: Take 1 tablet by mouth as needed for moderate constipation.) 60 tablet 0 Unknown   SYNTHROID  25 MCG tablet Take 1 tablet (25 mcg total) by mouth daily before breakfast. 90 tablet 1 Past Week   Scheduled:   azelastine   1 spray Each Nare Daily   diltiazem   360 mg Oral Daily   folic acid   1 mg Oral Daily   [START ON 12/08/2023] levothyroxine   25 mcg Oral QAC breakfast   magnesium  oxide  400 mg Oral BID   montelukast   10 mg Oral QHS   pantoprazole   40 mg Oral Daily   rosuvastatin   20 mg Oral Daily   Infusions:  PRN: acetaminophen , ondansetron  **OR** ondansetron  (ZOFRAN )  IV Anti-infectives (From admission, onward)    None       Assessment: 63 y.o. 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 Heparin  consult states to start first thing in the morning. CBC stable. Will use aPTT to monitor heparin , last dose per consult was 11/30 evening.   Goal of Therapy:  Heparin  level 0.3-0.7 units/ml  once aPTT and heparin  level correlate.  aPTT 66-102 seconds Monitor platelets by anticoagulation protocol: Yes   Plan:  Start heparin  infusion at 1150 units/hr Check aPTT level in 6 hours and daily heparin  level and CBC while on heparin  Continue to monitor H&H and platelets  Cathaleen GORMAN Blanch, PharmD, BCPS 12/07/2023,8:16 PM
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
## 2023-12-07 NOTE — Consult Note (Cosign Needed Addendum)
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 latest tomorrow morning, as Xarelto  will stay on hold with colonoscopy tentatively planned for Wednesday.   Plan /  Recommendations     Clear liquids Continue PPI Xarelto  dosing today on hold Follow H/H, repeat in am, monitor for persistent bleeding Will reassess in the morning. Tentatively planning on colonoscopy Wednesday.  Heparin  drip to be started at latest tomorrow morning     12/07/2023, 4:17 PM  Therisa MICAEL Stager, PhD, ANP-BC [facility]

## 2023-12-07 NOTE — ED Triage Notes (Signed)
 Pt arrived via POV from home c/o new onset of rectal bleeding. Pt reports bleeding began today and reports taking baby ASA and Eliquis daily. Pt also reports recent blood transfusion and reports actively receiving Chemo.

## 2023-12-08 ENCOUNTER — Ambulatory Visit: Payer: Self-pay | Admitting: Nurse Practitioner

## 2023-12-08 DIAGNOSIS — D693 Immune thrombocytopenic purpura: Secondary | ICD-10-CM | POA: Diagnosis present

## 2023-12-08 DIAGNOSIS — D6859 Other primary thrombophilia: Secondary | ICD-10-CM | POA: Diagnosis present

## 2023-12-08 DIAGNOSIS — C7931 Secondary malignant neoplasm of brain: Secondary | ICD-10-CM | POA: Diagnosis present

## 2023-12-08 DIAGNOSIS — N1831 Chronic kidney disease, stage 3a: Secondary | ICD-10-CM | POA: Diagnosis present

## 2023-12-08 DIAGNOSIS — D6481 Anemia due to antineoplastic chemotherapy: Secondary | ICD-10-CM | POA: Diagnosis present

## 2023-12-08 DIAGNOSIS — E039 Hypothyroidism, unspecified: Secondary | ICD-10-CM | POA: Diagnosis present

## 2023-12-08 DIAGNOSIS — Z95828 Presence of other vascular implants and grafts: Secondary | ICD-10-CM | POA: Diagnosis not present

## 2023-12-08 DIAGNOSIS — K922 Gastrointestinal hemorrhage, unspecified: Secondary | ICD-10-CM

## 2023-12-08 DIAGNOSIS — I129 Hypertensive chronic kidney disease with stage 1 through stage 4 chronic kidney disease, or unspecified chronic kidney disease: Secondary | ICD-10-CM | POA: Diagnosis present

## 2023-12-08 DIAGNOSIS — G43909 Migraine, unspecified, not intractable, without status migrainosus: Secondary | ICD-10-CM | POA: Diagnosis present

## 2023-12-08 DIAGNOSIS — I482 Chronic atrial fibrillation, unspecified: Secondary | ICD-10-CM | POA: Diagnosis present

## 2023-12-08 DIAGNOSIS — K5731 Diverticulosis of large intestine without perforation or abscess with bleeding: Secondary | ICD-10-CM | POA: Diagnosis present

## 2023-12-08 DIAGNOSIS — D701 Agranulocytosis secondary to cancer chemotherapy: Secondary | ICD-10-CM | POA: Diagnosis present

## 2023-12-08 DIAGNOSIS — K5730 Diverticulosis of large intestine without perforation or abscess without bleeding: Secondary | ICD-10-CM | POA: Diagnosis not present

## 2023-12-08 DIAGNOSIS — J45909 Unspecified asthma, uncomplicated: Secondary | ICD-10-CM | POA: Diagnosis present

## 2023-12-08 DIAGNOSIS — R933 Abnormal findings on diagnostic imaging of other parts of digestive tract: Secondary | ICD-10-CM | POA: Diagnosis not present

## 2023-12-08 DIAGNOSIS — Z7901 Long term (current) use of anticoagulants: Secondary | ICD-10-CM | POA: Diagnosis not present

## 2023-12-08 DIAGNOSIS — K641 Second degree hemorrhoids: Secondary | ICD-10-CM | POA: Diagnosis not present

## 2023-12-08 DIAGNOSIS — E1122 Type 2 diabetes mellitus with diabetic chronic kidney disease: Secondary | ICD-10-CM | POA: Diagnosis present

## 2023-12-08 DIAGNOSIS — Z87891 Personal history of nicotine dependence: Secondary | ICD-10-CM | POA: Diagnosis not present

## 2023-12-08 DIAGNOSIS — C3490 Malignant neoplasm of unspecified part of unspecified bronchus or lung: Secondary | ICD-10-CM | POA: Diagnosis not present

## 2023-12-08 DIAGNOSIS — I2510 Atherosclerotic heart disease of native coronary artery without angina pectoris: Secondary | ICD-10-CM | POA: Diagnosis not present

## 2023-12-08 DIAGNOSIS — C7951 Secondary malignant neoplasm of bone: Secondary | ICD-10-CM | POA: Diagnosis present

## 2023-12-08 DIAGNOSIS — C3491 Malignant neoplasm of unspecified part of right bronchus or lung: Secondary | ICD-10-CM | POA: Diagnosis present

## 2023-12-08 DIAGNOSIS — D61818 Other pancytopenia: Secondary | ICD-10-CM | POA: Diagnosis present

## 2023-12-08 DIAGNOSIS — K769 Liver disease, unspecified: Secondary | ICD-10-CM | POA: Diagnosis present

## 2023-12-08 DIAGNOSIS — R634 Abnormal weight loss: Secondary | ICD-10-CM | POA: Diagnosis present

## 2023-12-08 DIAGNOSIS — C781 Secondary malignant neoplasm of mediastinum: Secondary | ICD-10-CM | POA: Diagnosis present

## 2023-12-08 DIAGNOSIS — C7972 Secondary malignant neoplasm of left adrenal gland: Secondary | ICD-10-CM | POA: Diagnosis present

## 2023-12-08 DIAGNOSIS — D62 Acute posthemorrhagic anemia: Secondary | ICD-10-CM | POA: Diagnosis present

## 2023-12-08 DIAGNOSIS — Z6833 Body mass index (BMI) 33.0-33.9, adult: Secondary | ICD-10-CM | POA: Diagnosis not present

## 2023-12-08 DIAGNOSIS — K921 Melena: Secondary | ICD-10-CM | POA: Diagnosis present

## 2023-12-08 LAB — PROTIME-INR
INR: 1.2 (ref 0.8–1.2)
Prothrombin Time: 16.3 s — ABNORMAL HIGH (ref 11.4–15.2)

## 2023-12-08 LAB — BASIC METABOLIC PANEL WITH GFR
Anion gap: 12 (ref 5–15)
BUN: 6 mg/dL — ABNORMAL LOW (ref 8–23)
CO2: 23 mmol/L (ref 22–32)
Calcium: 9.4 mg/dL (ref 8.9–10.3)
Chloride: 103 mmol/L (ref 98–111)
Creatinine, Ser: 1.11 mg/dL — ABNORMAL HIGH (ref 0.44–1.00)
GFR, Estimated: 56 mL/min — ABNORMAL LOW (ref >60)
Glucose, Bld: 96 mg/dL (ref 70–99)
Potassium: 3.3 mmol/L — ABNORMAL LOW (ref 3.5–5.1)
Sodium: 138 mmol/L (ref 135–145)

## 2023-12-08 LAB — CBC
HCT: 22.8 % — ABNORMAL LOW (ref 36.0–46.0)
Hemoglobin: 7.7 g/dL — ABNORMAL LOW (ref 12.0–15.0)
MCH: 31.2 pg (ref 26.0–34.0)
MCHC: 33.8 g/dL (ref 30.0–36.0)
MCV: 92.3 fL (ref 80.0–100.0)
Platelets: 243 K/uL (ref 150–400)
RBC: 2.47 MIL/uL — ABNORMAL LOW (ref 3.87–5.11)
RDW: 14.2 % (ref 11.5–15.5)
WBC: 1.3 K/uL — CL (ref 4.0–10.5)
nRBC: 0 % (ref 0.0–0.2)

## 2023-12-08 LAB — APTT
aPTT: 30 s (ref 24–36)
aPTT: 61 s — ABNORMAL HIGH (ref 24–36)
aPTT: 80 s — ABNORMAL HIGH (ref 24–36)

## 2023-12-08 LAB — HEPARIN LEVEL (UNFRACTIONATED): Heparin Unfractionated: >1.1 [IU]/mL — ABNORMAL HIGH (ref 0.30–0.70)

## 2023-12-08 LAB — MAGNESIUM: Magnesium: 1.5 mg/dL — ABNORMAL LOW (ref 1.7–2.4)

## 2023-12-08 MED ORDER — BISACODYL 5 MG PO TBEC
15.0000 mg | DELAYED_RELEASE_TABLET | Freq: Once | ORAL | Status: AC
  Administered 2023-12-08: 15 mg via ORAL
  Filled 2023-12-08: qty 3

## 2023-12-08 MED ORDER — PEG 3350-KCL-NA BICARB-NACL 420 G PO SOLR
4000.0000 mL | Freq: Once | ORAL | Status: AC
  Administered 2023-12-08: 4000 mL via ORAL

## 2023-12-08 MED ORDER — POTASSIUM CHLORIDE 20 MEQ PO PACK
40.0000 meq | PACK | Freq: Once | ORAL | Status: AC
  Administered 2023-12-08: 40 meq via ORAL
  Filled 2023-12-08: qty 2

## 2023-12-08 MED ORDER — SODIUM CHLORIDE 0.9 % IV SOLN
INTRAVENOUS | Status: AC
  Administered 2023-12-08: 10 mL via INTRAVENOUS

## 2023-12-08 MED ORDER — MAGNESIUM SULFATE 2 GM/50ML IV SOLN
2.0000 g | Freq: Once | INTRAVENOUS | Status: AC
  Administered 2023-12-08: 2 g via INTRAVENOUS
  Filled 2023-12-08: qty 50

## 2023-12-08 NOTE — Plan of Care (Signed)
   Problem: Education: Goal: Knowledge of General Education information will improve Description: Including pain rating scale, medication(s)/side effects and non-pharmacologic comfort measures Outcome: Progressing   Problem: Health Behavior/Discharge Planning: Goal: Ability to manage health-related needs will improve Outcome: Progressing   Problem: Clinical Measurements: Goal: Will remain free from infection Outcome: Progressing

## 2023-12-08 NOTE — Progress Notes (Signed)
 [facility]  PROCEDURAL EXPEDITER PROGRESS NOTE  Patient Name: Becky [name]  DOB:1960/01/31 Date of Admission: 12/07/2023  Date of Assessment:12/08/23   -------------------------------------------------------------------------------------------------------------------   Brief clinical summary: Pt is a 63 yr old female scheduled for a colonoscpy on 12/09/2023, for bleeding.   She is a DM, Lung can cancer, CAD, HTN, Hypothryroidism, recent CVA Asthma, A fib.    Orders in place:  Yes   Communication with surgical team if no orders: n/a  Labs, test, and orders reviewed: yes  Requires surgical clearance:  No  What type of clearance: n/a  Clearance received: n/a  Barriers noted:none   Intervention provided by [facility] team: n/a  Barrier resolved:  not applicable   -------------------------------------------------------------------------------------------------------------------  [employer], Ronal DELENA Bald Please contact us  directly via secure chat (search for [facility]) or by calling us  at [phone] [facility]).

## 2023-12-08 NOTE — Consult Note (Addendum)
 PHARMACY - ANTICOAGULATION CONSULT NOTE  Pharmacy Consult for Heparin  Indication: history of embolic stroke X 2 when off of Xarelto  short periods of time.  Allergies  Allergen Reactions   Wound Dressing Adhesive Other (See Comments)    Ripped skin    Apresoline  [Hydralazine ] Nausea Only   Latex Other (See Comments)    Blisters/burns/welts    Other Other (See Comments)    Band aids discolor skin EKG pads irritate skin    Prednisone  Swelling    Patient states that she can take methylprednisolone  without complications She has tolerated PO dexamethasone  well. No complaints of swelling voiced from patient.    Patient Measurements: Height: 5' 7 (170.2 cm) Weight: 98.4 kg (217 lb) IBW/kg (Calculated) : 61.6 HEPARIN  DW (KG): 83.4  Vital Signs: Temp: 98 F (36.7 C) (12/02 1305) Temp Source: Oral (12/02 1305) BP: 105/75 (12/02 1305) Pulse Rate: 73 (12/02 1305)  Labs: Recent Labs    12/07/23 0952 12/08/23 0445 12/08/23 1420  HGB 8.3* 7.7*  --   HCT 24.9* 22.8*  --   PLT 325 243  --   APTT  --  30 61*  LABPROT  --  16.3*  --   INR  --  1.2  --   HEPARINUNFRC  --  >1.10*  --   CREATININE 1.20* 1.11*  --     Estimated Creatinine Clearance: 63.3 mL/min (A) (by C-G formula based on SCr of 1.11 mg/dL (H)).   Medical History: Past Medical History:  Diagnosis Date   Anemia    Arthritis    Asthma    Atrial fibrillation (HCC) 2018   Back pain    BV (bacterial vaginosis) 05/23/2013   Constipation 11/21/2014   Coronary artery disease    Current use of long term anticoagulation 2018   Diabetes mellitus without complication (HCC)    Dysphagia 2011   Fibroids 03/13/2016   GERD (gastroesophageal reflux disease)    Goiter 2009   Hematochezia 2011   Hematuria 05/23/2013   History of radiation therapy    Right lung, right ribs, brain- 07/08/23-07/29/23- Dr. Lynwood Nasuti   Hyperlipidemia    Hypertension    Hypothyroidism    LGSIL of cervix of undetermined significance  03/04/2021   03/04/21 +HPV 16/other , will get colpo, per ASCCP guidelines, immediate CIN3+risjk is 5.65%   Lung cancer (HCC)    Migraines    PAF (paroxysmal atrial fibrillation) (HCC)    a. diagnosed in 11/2016 --> started on Xarelto  for anticoagulation   Pelvic pain in female 11/02/2013   Plantar fasciitis of right foot    Pre-diabetes    Sleep apnea    dont use cpap says causes sinus infection   Stroke (HCC)    04/07/23; 06/2023; 07/2023    Medications:  Medications Prior to Admission  Medication Sig Dispense Refill Last Dose/Taking   acetaminophen  (TYLENOL ) 500 MG tablet Take 1,000 mg by mouth every 6 (six) hours as needed for mild pain (pain score 1-3).   Unknown   albuterol  (VENTOLIN  HFA) 108 (90 Base) MCG/ACT inhaler Inhale 2 puffs into the lungs every 4 (four) hours as needed for wheezing or shortness of breath. 18 g 0 Unknown   aspirin  EC 81 MG tablet Take 1 tablet (81 mg total) by mouth daily. Swallow whole. 30 tablet 12 12/06/2023 Morning   Azelastine  HCl 137 MCG/SPRAY SOLN Place 1 spray into both nostrils daily.   Unknown   dexlansoprazole  (DEXILANT ) 60 MG capsule Take 1 capsule (60 mg total) by  mouth daily. 90 capsule 3 12/06/2023 Morning   diltiazem  (TIADYLT  ER) 360 MG 24 hr capsule TAKE ONE (1) CAPSULE BY MOUTH ONCE DAILY *NEW PRESCRIPTION REQUEST* 90 capsule 3 12/06/2023 Evening   EPINEPHrine  0.3 mg/0.3 mL IJ SOAJ injection Inject 0.3 mg into the muscle once as needed for anaphylaxis.   Unknown   fluconazole  (DIFLUCAN ) 100 MG tablet Take 100 mg by mouth daily.   Unknown   folic acid  (FOLVITE ) 1 MG tablet Take 1 tablet (1 mg total) by mouth daily. 90 tablet 2 12/06/2023 Morning   lidocaine -prilocaine  (EMLA ) cream APPLY TOPICALLY TO AFFECTED AREAS ONCE DAILY (Patient taking differently: Apply 1 Application topically See admin instructions. As needed for port) 30 g 11 Past Week   linaclotide  (LINZESS ) 72 MCG capsule Take 1 capsule (72 mcg total) by mouth daily before breakfast. 90  capsule 3 Unknown   magnesium  oxide (MAG-OX) 400 (240 Mg) MG tablet Take 1 tablet (400 mg total) by mouth 2 (two) times daily. 30 tablet 1 12/06/2023 Evening   metoprolol  tartrate (LOPRESSOR ) 25 MG tablet Take 0.5 tablets (12.5 mg total) by mouth 2 (two) times daily as needed. (Patient taking differently: Take 12.5 mg by mouth 2 (two) times daily as needed (heart rate).)   Unknown   montelukast  (SINGULAIR ) 10 MG tablet Take 10 mg by mouth at bedtime.   12/06/2023 Bedtime   NON FORMULARY Pt uses a cpap nightly   12/06/2023 Bedtime   ondansetron  (ZOFRAN ) 8 MG tablet Take 1 tablet (8 mg total) by mouth every 8 (eight) hours as needed for nausea or vomiting. 30 tablet 1 Past Week   polyethylene glycol (MIRALAX ) 17 g packet Take 17 g by mouth 2 (two) times daily. 30 each 1 Unknown   potassium chloride  SA (KLOR-CON  M) 20 MEQ tablet Take 1 tablet (20 mEq total) by mouth 2 (two) times daily. 16 tablet 0 12/06/2023 Evening   prochlorperazine  (COMPAZINE ) 10 MG tablet Take 1 tablet (10 mg total) by mouth every 6 (six) hours as needed. 30 tablet 11 12/06/2023 Evening   RESTASIS  0.05 % ophthalmic emulsion Place 1 drop into both eyes daily as needed (dry eyes).   Unknown   rivaroxaban  (XARELTO ) 20 MG TABS tablet Take 1 tablet (20 mg total) by mouth daily. 90 tablet 1 12/06/2023 at 10:15 PM   rosuvastatin  (CRESTOR ) 20 MG tablet Take 1 tablet (20 mg total) by mouth daily. 90 tablet 3 12/06/2023 Evening   SENEXON-S 8.6-50 MG tablet Take 1 tablet by mouth 2 (two) times daily. (Patient taking differently: Take 1 tablet by mouth as needed for moderate constipation.) 60 tablet 0 Unknown   SYNTHROID  25 MCG tablet Take 1 tablet (25 mcg total) by mouth daily before breakfast. 90 tablet 1 Past Week   Scheduled:   azelastine   1 spray Each Nare Daily   bisacodyl   15 mg Oral Once   diltiazem   360 mg Oral Daily   folic acid   1 mg Oral Daily   levothyroxine   25 mcg Oral QAC breakfast   magnesium  oxide  400 mg Oral BID    montelukast   10 mg Oral QHS   pantoprazole   40 mg Oral Daily   polyethylene glycol-electrolytes  4,000 mL Oral Once   rosuvastatin   20 mg Oral Daily   Infusions:   sodium chloride      heparin  1,150 Units/hr (12/08/23 0847)   PRN: acetaminophen , ondansetron  **OR** ondansetron  (ZOFRAN ) IV Anti-infectives (From admission, onward)    None       Assessment:  63 y.o. 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 Heparin  consult states to start first thing in the morning. CBC stable. Will use aPTT to monitor heparin , last dose per consult was 11/30 evening.   APTT 30> 61, just below goal, will make an increase in infusion. In addition, patient going for colonoscopy on Wednesday 12/3. MD instructed us  to hold heparin  at 0600 tomorrow morning  Goal of Therapy:  Heparin  level 0.3-0.7 units/ml once aPTT and heparin  level correlate.  aPTT 66-102 seconds Monitor platelets by anticoagulation protocol: Yes   Plan:  Increase heparin  infusion to 1250 units/hr Check aPTT level in 6 hours  Plan to hold heparin  tomorrow morning at 0600(12/3) Daily CBC while on heparin  Continue to monitor H&H and platelets  Zohan Shiflet, BS Pharm D, BCPS Clinical Pharmacist 12/08/2023,3:55 PM

## 2023-12-08 NOTE — Progress Notes (Signed)
 PROGRESS NOTE  Julyana [name] FMW:[id number] DOB: 1960-07-16 DOA: 12/07/2023 PCP: [name], FNP  Brief History:  63 year old female with atrial fibrillation on rivaroxaban , acquired thrombophilia, asthma, coronary artery disease, history of CVA, GERD, goiter, hyperlipidemia, hypertension, hypothyroidism, diabetes mellitus, recently diagnosed with stage IV non-small cell lung cancer adenocarcinoma with metastatic disease including mediastinal adenopathy, metastatic disease to the 6 rib, brain, left adrenal gland. She completed radiation to the right rib, right lung bronchus on 07/29/2023. Systemic chemotherapy was postponed due to acute CVA which occurred in early July 2025 and then on 08/10/2023 she was found to have severe thrombocytopenia and subsequently treated for ITP with IVIG. Her platelets finally recovered and she received her first systemic chemotherapy treatment with carboplatin , Alimta , and  08/31/2023.  She has received 3 cycles of carboplatin  and Alimta .  Her last dose of chemotherapy was 07/31/2023.  Per Dr. Gatha, plan is to eventually transition to monotherapy with Keytruda  in the future.  Nevertheless, the patient developed rectal bleeding in the morning of 12/07/2023.  She denies any fevers, chills, chest pain, shortness of breath, abdominal pain.  She has some dizziness.  She denies any syncope.  There is no hematuria.  She denies any new medications.  She denies any NSAIDs. In the ED, patient was afebrile hemodynamically stable with oxygen saturation 99% room air.  WBC 2.5, hemoglobin 8.3, platelets 325.  Sodium 139, potassium 3.8, bicarbonate 22, serum creatinine 1.20.  FOBT was positive with red blood.  GI was consulted to assist with management.    Assessment/Plan:  Hematochezia/ABLA - CTA GI bleed--negative for active bleed.  3.2 cm low-attenuation hepatic lesion adjacent to gallbladder fossa, decrease inflammation and edema of the proximal transverse  colon. - GI consult appreciated>>colonoscopy 12/3 - Clear Liquid diet - Holding Xarelto --last dose 12/06/23 10 PM - pt and daughter want IV heparin  bridge--risks/benefits discussed with them including but not limited to life-threatening bleed, hemorrhagic shock which they understand - continue IV heparin  bridge for now--hold if persistent hematochezia   Stage IV non-small cell lung cancer with mets to brain, 6th rib and left adrenal gland -- pt completed radiation treatment on 07/29/23 -- pt recently started systemic chemo on 08/31/23--carboplatin , pemetrexed   --Last treatment 12/01/2023 - follow up with Dr. Sherrod after dc   Chronic atrial fibrillation -- resume home diltiazem  CD 360 mg and metoprolol  -- holding rivaroxaban  for now in case surgery needed -from her last visit with Dr. Ladona on 07/16/23--mets felt to be small and no contraindication for anticoagulation. Reassess candidacy for Watchman device   CKD stage IIIa - Baseline creatinine 1.0-1.3   Leukopenia/anemia -Secondary to chemotherapy -Monitor CBC   Essential hypertension  -- resume cardizem  CD  - He takes metoprolol  as needed for palpitations   Type 2 diabetes mellitus --07/24/23 A1C--6.0 -novolog  sliding scale   Hypothyroidism -- resume home levothyroxine     Obesity - BMI 33.99 - lifestyle modification  Hypokalemia/Hypomagnesemia -replete         Family Communication:   daughter at bedside 12/2  Consultants:  GI  Code Status:  FULL  DVT Prophylaxis:  IV Heparin    Procedures: As Listed in Progress Note Above  Antibiotics: None        Subjective: Pt had small volume hematochezia today.  Denies f/c, cp, sob,  n/v, abd pain dizziness  Objective: Vitals:   12/07/23 1939 12/07/23 2310 12/08/23 0900 12/08/23 1305  BP: 114/76 (!) 92/57 111/66 105/75  Pulse: 91 89  73  Resp: 18   16  Temp: 98.8 F (37.1 C) 98.9 F (37.2 C)  98 F (36.7 C)  TempSrc: Oral Oral  Oral  SpO2: 94%  98%  99%  Weight:      Height:        Intake/Output Summary (Last 24 hours) at 12/08/2023 1812 Last data filed at 12/08/2023 1700 Gross per 24 hour  Intake 120.7 ml  Output --  Net 120.7 ml   Weight change:  Exam:  General:  Pt is alert, follows commands appropriately, not in acute distress HEENT: No icterus, No thrush, No neck mass, [place]/AT Cardiovascular: RRR, S1/S2, no rubs, no gallops Respiratory: CTA bilaterally, no wheezing, no crackles, no rhonchi Abdomen: Soft/+BS, non tender, non distended, no guarding Extremities: No edema, No lymphangitis, No petechiae, No rashes, no synovitis   Data Reviewed: I have personally reviewed following labs and imaging studies Basic Metabolic Panel: Recent Labs  Lab 12/07/23 0952 12/08/23 0445  NA 139 138  K 3.8 3.3*  CL 104 103  CO2 22 23  GLUCOSE 107* 96  BUN 9 6*  CREATININE 1.20* 1.11*  CALCIUM  10.0 9.4  MG  --  1.5*   Liver Function Tests: Recent Labs  Lab 12/07/23 0952  AST 35  ALT 25  ALKPHOS 71  BILITOT 0.6  PROT 6.6  ALBUMIN 4.1   No results for input(s): LIPASE, AMYLASE in the last 168 hours. No results for input(s): AMMONIA in the last 168 hours. Coagulation Profile: Recent Labs  Lab 12/08/23 0445  INR 1.2   CBC: Recent Labs  Lab 12/07/23 0952 12/08/23 0445  WBC 2.5* 1.3*  NEUTROABS 2.0  --   HGB 8.3* 7.7*  HCT 24.9* 22.8*  MCV 93.3 92.3  PLT 325 243   Cardiac Enzymes: No results for input(s): CKTOTAL, CKMB, CKMBINDEX, TROPONINI in the last 168 hours. BNP: Invalid input(s): POCBNP CBG: No results for input(s): GLUCAP in the last 168 hours. HbA1C: No results for input(s): HGBA1C in the last 72 hours. Urine analysis:    Component Value Date/Time   COLORURINE STRAW (A) 12/07/2023 1721   APPEARANCEUR CLEAR 12/07/2023 1721   APPEARANCEUR Clear 09/23/2023 1432   LABSPEC 1.036 (H) 12/07/2023 1721   PHURINE 6.0 12/07/2023 1721   GLUCOSEU NEGATIVE 12/07/2023 1721   HGBUR  NEGATIVE 12/07/2023 1721   BILIRUBINUR NEGATIVE 12/07/2023 1721   BILIRUBINUR Negative 09/23/2023 1432   KETONESUR NEGATIVE 12/07/2023 1721   PROTEINUR NEGATIVE 12/07/2023 1721   UROBILINOGEN 0.2 01/16/2018 1308   NITRITE NEGATIVE 12/07/2023 1721   LEUKOCYTESUR NEGATIVE 12/07/2023 1721   Sepsis Labs: @LABRCNTIP (procalcitonin:4,lacticidven:4) )No results found for this or any previous visit (from the past 240 hours).   Scheduled Meds:  azelastine   1 spray Each Nare Daily   diltiazem   360 mg Oral Daily   folic acid   1 mg Oral Daily   levothyroxine   25 mcg Oral QAC breakfast   magnesium  oxide  400 mg Oral BID   montelukast   10 mg Oral QHS   pantoprazole   40 mg Oral Daily   rosuvastatin   20 mg Oral Daily   Continuous Infusions:  sodium chloride  10 mL (12/08/23 1616)   heparin  1,250 Units/hr (12/08/23 1612)    Procedures/Studies: CT ANGIO GI BLEED Result Date: 12/07/2023 EXAM: CTA ABDOMEN AND PELVIS WITH CONTRAST 12/07/2023 12:04:43 PM TECHNIQUE: CTA images of the abdomen and pelvis with intravenous contrast. 100 mL (iohexol  (OMNIPAQUE ) 350 MG/ML injection 100 mL IOHEXOL  350 MG/ML SOLN). Three-dimensional MIP/volume rendered formations were  performed. Automated exposure control, iterative reconstruction, and/or weight based adjustment of the mA/kV was utilized to reduce the radiation dose to as low as reasonably achievable. COMPARISON: 11/23/2023 and previous. CLINICAL HISTORY: Lower GI bleed. Right lung nonsmall cell carcinoma. FINDINGS: VASCULATURE: GI BLEED: No active extravasation of contrast within the GI tract. AORTA: No acute finding. No abdominal aortic aneurysm. No dissection. CELIAC TRUNK: No acute finding. No occlusion or significant stenosis. SUPERIOR MESENTERIC ARTERY: No acute finding. No occlusion or significant stenosis. INFERIOR MESENTERIC ARTERY: No acute finding. No occlusion or significant stenosis. RENAL ARTERIES: Duplicated left renal arteries, inferior dominant, both  patent. No acute finding. No occlusion or significant stenosis. ILIAC ARTERIES: Moderate aortoiliac calcified plaque without aneurysm. No acute finding. No occlusion or significant stenosis. ABDOMEN/PELVIS: LOWER CHEST: Visualized portion of the lower chest demonstrates no acute abnormality. LIVER: 3.2 cm poorly marginated low attenuation lesion in hepatic segment 4b adjacent to the gallbladder fossa. GALLBLADDER AND BILE DUCTS: Gallbladder is unremarkable. No biliary ductal dilatation. SPLEEN: The spleen is unremarkable. PANCREAS: The pancreas is unremarkable. ADRENAL GLANDS: 1.6 cm left adrenal nodule stable since 09/07/23.   no acute abnormality. KIDNEYS, URETERS AND BLADDER: 2.5 cm right lower pole renal cortical cyst as before. No stones in the kidneys or ureters. No hydronephrosis. No perinephric or periureteral stranding. Urinary bladder is unremarkable. GI AND BOWEL: Stomach and duodenal sweep demonstrate no acute abnormality. Normal appendix. Some decrease in the inflammatory/edematous changes and wall thickening previously noted involving the proximal transverse colon. Scattered diverticula from the descending and sigmoid segments without adjacent inflammatory change. There is no bowel obstruction. No abnormal bowel wall thickening or distension. REPRODUCTIVE: Reproductive organs are unremarkable. PERITONEUM AND RETROPERITONEUM: No ascites or free air. LYMPH NODES: No lymphadenopathy. BONES AND SOFT TISSUES: Facet DJD in the lower lumbar spineL4-S1. No acute abnormality of the bones. No acute soft tissue abnormality. IMPRESSION: 1. No active GI bleeding. 2. 3.2 cm poorly marginated low attenuation lesion in hepatic segment 4b adjacent to the gallbladder fossa; recommend hepatic protocol MRI for characterization. 3. Some decrease in the previously described inflammatory and edematous changes with wall thickening involving the proximal transverse colon. Electronically signed by: Katheleen Faes MD 12/07/2023  01:43 PM EST RP Workstation: [id number]   CT CHEST ABDOMEN PELVIS WO CONTRAST Result Date: 11/26/2023 CLINICAL DATA:  Non-small cell lung cancer of the right lung EXAM: CT CHEST, ABDOMEN AND PELVIS WITHOUT CONTRAST TECHNIQUE: Multidetector CT imaging of the chest, abdomen and pelvis was performed following the standard protocol without IV contrast. RADIATION DOSE REDUCTION: This exam was performed according to the departmental dose-optimization program which includes automated exposure control, adjustment of the mA and/or kV according to patient size and/or use of iterative reconstruction technique. COMPARISON:  CT abdomen pelvis November 17, 2023. CT chest abdomen pelvis September 18, 2023. FINDINGS: CT CHEST FINDINGS Cardiovascular: The heart size is normal. Atherosclerotic calcifications of coronary arteries. Left-sided porta catheter tip terminates in right atrium. No pericardial effusion. Mediastinum/Nodes: Few subcentimeter mediastinal lymph nodes. Limited evaluation of bilateral hilar lymph nodes given the lack contrast. Lungs/Pleura: Right upper lobe mass measuring 2.7 x 2 cm grossly stable to prior. 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 New patchy areas of ground-glass attenuation and fibrosis with associated more focal consolidation/irregular nodule is also identified in apical segment of right lower lobe measuring 2.4 x 1.5 cm (6/44) which may represent metastatic  disease versus postradiation changes. Left lower lobe subpleural micronodule stable to prior (6/94). No pleural effusion. Musculoskeletal: Sclerotic changes with associated new nondisplaced fracture of right anterior sixth rib consistent with metastasis until proven otherwise. CT ABDOMEN PELVIS FINDINGS Hepatobiliary: No significant  hepatic steatosis. Hypodense liver lesion in segment 4 measuring 3.4 x 3 cm, stable to prior and incompletely assessed on current noncontrast CT. Gallbladder sludge/cholelithiasis. No pericholecystic fluid collection. Pancreas: Atrophic changes of the pancreas. No pancreatic ductal dilatation. Spleen: Normal spleen Adrenals/Urinary Tract: Left adrenal lipid rich adenoma measuring 1.4 cm, stable to prior she does not require imaging follow-up. Right adrenal is normal. Right kidney lower pole cortical cyst which does not require imaging follow-up measuring 2.3 cm. No hydronephrosis. Stomach/Bowel: Colonic diverticulosis. There is a short-segment circumferential wall thickening spanning approximately 7.3 cm of the proximal transverse colon in right upper quadrant measuring 7.3 x 4 cm with pericolonic fat stranding 2/66). No bowel obstruction. Normal appendix. Small bowel loops are normal. Vascular/Lymphatic: Aortic atherosclerosis. No enlarged abdominal or pelvic lymph nodes. Reproductive: Uterine fibroids with coarse calcifications. Normal bilateral adnexa. Other: Tiny fat containing umbilical hernia. Musculoskeletal: See above.  No other osseous lesion identified. IMPRESSION: Stable appearance of primary known perifissural right upper lobe malignancy. No suspicious new mediastinal or supraclavicular lymphadenopathy. Interval increase in peritumoral right upper and superior segment right lower lobe consolidation/fibrotic changes with traction bronchiectasis which may represent evolving postradiation/posttreatment changes versus progressive disease. Follow-up PET-CT is suggested for further differentiation in 6-8 weeks. Persistent sclerotic changes of right anterior sixth rib with a new nondisplaced fracture (metastasis versus post radiation osteitis). Persistent and grossly stable circumferential short-segment wall thickening with associated pericolonic fat stranding involving the proximal transverse colon may  represent focal colitis/diverticulitis versus underlying malignancy. Recommend correlation with clinical findings and colonoscopy. Again seen there is a hypodense liver lesion in segment 4, similar to prior, incompletely assessed on current noncontrast CT. Metastasis can not be excluded. This can be further assessed at the time of PET-CT. Gallbladder sludge/cholelithiasis. Left adrenal nodule stable to prior likely benign/treated disease. Electronically Signed   By: Megan  Zare M.D.   On: 11/26/2023 16:51   CT ABDOMEN PELVIS WO CONTRAST Result Date: 11/17/2023 EXAM: CT ABDOMEN AND PELVIS WITHOUT CONTRAST 11/17/2023 07:45:00 PM TECHNIQUE: CT of the abdomen and pelvis was performed without the administration of intravenous contrast. Multiplanar reformatted images are provided for review. Automated exposure control, iterative reconstruction, and/or weight-based adjustment of the mA/kV was utilized to reduce the radiation dose to as low as reasonably achievable. COMPARISON: 09/18/2023 CLINICAL HISTORY: Bowel obstruction suspected. FINDINGS: LOWER CHEST: No acute abnormality. LIVER: 3.5 cm low density lesion inferiorly along the gallbladder fossa (image 20), possibly reflecting focal fat, but new from recent prior, metastasis not excluded. GALLBLADDER AND BILE DUCTS: Layering tiny gallstones, without associated inflammatory changes. No biliary ductal dilatation. SPLEEN: No acute abnormality. PANCREAS: No acute abnormality. ADRENAL GLANDS: 16 mm left adrenal nodule (image 24) unchanged from multiple priors, likely reflecting a benign adrenal adenoma. KIDNEYS, URETERS AND BLADDER: No stones in the kidneys or ureters. No hydronephrosis. No perinephric or periureteral stranding. Urinary bladder is unremarkable. GI AND BOWEL: Stomach demonstrates no acute abnormality. Short segment wall thickening with pericolonic inflammatory changes along the proximal transverse colon (image 36), suggesting infectious/inflammatory  colitis. Normal appendix (image 55). Sigmoid diverticulosis, without evidence of diverticulitis. There is no bowel obstruction. PERITONEUM AND RETROPERITONEUM: No ascites. No free air. VASCULATURE: Aorta is normal in caliber. Atherosclerotic calcifications of the abdominal aorta and branch vessels.  LYMPH NODES: No lymphadenopathy. REPRODUCTIVE ORGANS: The uterus is within normal limits. BONES AND SOFT TISSUES: Mild degenerative changes of the lower thoracic spine. No acute osseous abnormality. No focal soft tissue abnormality. IMPRESSION: 1. Suspected infectious/inflammatory colitis involving the proximal transverse colon. 2. New 3.5 cm low-density lesion along the gallbladder fossa, possibly reflecting focal fat, although metastasis is not excluded. Consider follow-up MR abdomen with/without contrast in 4-6 weeks. Electronically signed by: Pinkie Pebbles MD 11/17/2023 07:52 PM EST RP Workstation: [id number]   DG Chest Port 1 View Result Date: 11/17/2023 CLINICAL DATA:  Cough. EXAM: PORTABLE CHEST 1 VIEW COMPARISON:  Chest radiograph dated 10/15/2023. FINDINGS: Port-A-Cath in similar position. Right suprahilar streaky density progressed since the prior radiograph. No new consolidation. No pleural effusion pneumothorax. Stable cardiac silhouette no acute osseous pathology. IMPRESSION: No acute cardiopulmonary process. Electronically Signed   By: Vanetta Chou M.D.   On: 11/17/2023 17:50    Alm Schneider, DO  [facility]  If 7PM-7AM, please contact night-coverage [email or web address] Password [email or web address] 12/08/2023, 6:12 PM   LOS: 0 days

## 2023-12-08 NOTE — Progress Notes (Signed)
   12/08/23 2220  BiPAP/CPAP/SIPAP  BiPAP/CPAP/SIPAP Pt Type Adult  BiPAP/CPAP/SIPAP Resmed  Mask Type Full face mask  Dentures removed? Not applicable  EPAP 13 cmH2O  FiO2 (%) 21 %  Patient Home Machine Yes  Safety Check Completed by RT for Home Unit Yes, no issues noted  Patient Home Mask Yes  Patient Home Tubing Yes  Auto Titrate No  Device Plugged into RED Power Outlet Yes   Patient's home unit setup and ready for use. Safety check performed; plugged into red outlet.

## 2023-12-08 NOTE — TOC Initial Note (Signed)
 Transition of Care [facility]) - Initial/Assessment Note    Patient Details  Name: Becky [name] MRN: [id number] Date of Birth: Feb 20, 1960  [facility]) CM/SW Contact:    Mcarthur Saddie Kim, LCSW Phone Number: 12/08/2023, 1:51 PM  Clinical Narrative:  Pt admitted for hematochezia. Assessment completed due to high risk readmission score. Pt reports she lives with her daughter and son-in-law. They assist pt with ADLs as needed. No current home health services. No needs reported at this time. TOC will follow.                  Expected Discharge Plan: Home/Self Care Barriers to Discharge: Continued Medical Work up   Patient Goals and CMS Choice Patient states their goals for this hospitalization and ongoing recovery are:: return home   Choice offered to / list presented to : Patient [place] ownership interest in [facility].provided to::  (n/a)    Expected Discharge Plan and Services In-house Referral: Clinical Social Work     Living arrangements for the past 2 months: Single Family Home                                      Prior Living Arrangements/Services Living arrangements for the past 2 months: Single Family Home Lives with:: Adult Children Patient language and need for interpreter reviewed:: Yes Do you feel safe going back to the place where you live?: Yes      Need for Family Participation in Patient Care: No (Comment)   Current home services: DME Criminal Activity/Legal Involvement Pertinent to Current Situation/Hospitalization: No - Comment as needed  Activities of Daily Living   ADL Screening (condition at time of admission) Independently performs ADLs?: No Does the patient have a NEW difficulty with bathing/dressing/toileting/self-feeding that is expected to last >3 days?: No Does the patient have a NEW difficulty with getting in/out of bed, walking, or climbing stairs that is expected to last >3 days?: No Does the patient  have a NEW difficulty with communication that is expected to last >3 days?: No Is the patient deaf or have difficulty hearing?: No Does the patient have difficulty seeing, even when wearing glasses/contacts?: No Does the patient have difficulty concentrating, remembering, or making decisions?: No  Permission Sought/Granted                  Emotional Assessment     Affect (typically observed): Appropriate Orientation: : Oriented to Self, Oriented to Place, Oriented to  Time, Oriented to Situation Alcohol / Substance Use: Not Applicable Psych Involvement: No (comment)  Admission diagnosis:  Hematochezia [K92.1] Lower GI bleed [K92.2] Anemia, unspecified type [D64.9] Patient Active Problem List   Diagnosis Date Noted   Chronic kidney disease, stage 3a (HCC) 12/07/2023   Obesity (BMI 30-39.9) 12/07/2023   Colitis 11/17/2023   Hypokalemia 11/17/2023   Diverticulitis of colon with perforation 09/07/2023   Hyponatremia 09/07/2023   Hypoalbuminemia 09/07/2023   Thrombocytopenia 09/07/2023   Pancytopenia (HCC) 08/26/2023   Transient neurologic deficit 07/24/2023   Atrial fibrillation with RVR (HCC) 07/24/2023   Port-A-Cath in place 07/20/2023   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) 07/16/2023   Anxiety 07/15/2023   Obesity, class 2 07/12/2023   Folate deficiency 06/18/2023   Non-small cell carcinoma of lung (HCC) 06/04/2023   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) 06/04/2023   Metastasis to bone (HCC) 06/04/2023  Hematochezia 04/09/2023   Stroke-like symptoms 04/08/2023   History of CVA (cerebrovascular accident) 04/07/2023   Prolapsed internal hemorrhoids, grade 3 05/02/2021   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   Prolapsed internal hemorrhoids, grade 2 10/11/2020   Unilateral primary osteoarthritis, left knee 08/02/2020   History of GI bleed 07/18/2020   Normocytic anemia    Current use of long term anticoagulation 11/12/2016   Atrial  fibrillation, chronic (HCC) 11/07/2016   Coronary artery disease due to lipid rich plaque    Fibroids 03/13/2016   Varicose 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 07/23/2015   Constipation 11/21/2014   Hematuria 05/23/2013   GOITER 03/10/2007   Hypothyroidism 03/10/2007   Type 2 diabetes mellitus with hyperlipidemia (HCC) 03/10/2007   Hyperlipidemia 03/10/2007   ANEMIA-IRON DEFICIENCY 03/10/2007   Essential hypertension 03/10/2007   Gastroesophageal reflux disease 03/10/2007   PCP:  [name], FNP Pharmacy:   [facility] [address], [place] - [address] [place] [address] Phone: [phone] Fax: [phone]  [id number] - [place], [place] - [address] [place] [address] Phone: [phone] Fax: [phone]  [name] - [address] [place] - [address] [place] [address] Phone: [phone] Fax: [phone]  [facility] [place]  - [place], [place] - [address] [place] [address] Phone: [phone] Fax: [phone]  [facility] [place] [address] [place] [address] Phone: [phone] Fax: [phone]     Social Drivers of Health (SDOH) Social History: SDOH Screenings   Food Insecurity: No Food Insecurity (12/01/2023)  Housing: Low Risk  (11/24/2023)  Transportation Needs: No Transportation Needs (11/24/2023)  Utilities: Not At Risk (11/24/2023)  Alcohol Screen: Low Risk  (09/23/2023)  Depression (PHQ2-9): Medium Risk (11/24/2023)  Financial Resource Strain: Low Risk  (09/23/2023)  Physical Activity: Inactive (09/23/2023)  Social Connections: Moderately Integrated (11/18/2023)  Stress: No Stress Concern Present (09/23/2023)  Tobacco Use: Medium Risk (12/07/2023)  Health Literacy: Adequate Health Literacy (10/16/2022)   SDOH  Interventions:     Readmission Risk Interventions    12/08/2023    1:50 PM 09/08/2023    7:29 AM 04/08/2023   11:09 AM  Readmission Risk Prevention Plan  Transportation Screening Complete Complete Complete  Home Care Screening   Complete  Medication Review (RN CM)   Complete  Medication Review Oceanographer) Complete Complete   HRI or Home Care Consult Complete Complete   SW Recovery Care/Counseling Consult Complete Complete   Palliative Care Screening Not Applicable Not Applicable   Skilled Nursing Facility Not Applicable Not Applicable

## 2023-12-08 NOTE — Progress Notes (Signed)
 Transition of Care Department [facility]) has reviewed patient and no other TOC needs have been identified at this time. We will continue to monitor patient advancement through interdisciplinary progression rounds. If new patient transition needs arise, please place a TOC consult.   12/08/23 0759  TOC Brief Assessment  Insurance and Status Reviewed  Patient has primary care physician Yes  Home environment has been reviewed Lives with daughter and son-in-law.  Prior level of function: Some assist from family.  Prior/Current Home Services No current home services  Social Drivers of Health Review SDOH reviewed no interventions necessary  Readmission risk has been reviewed Yes  Transition of care needs no transition of care needs at this time

## 2023-12-08 NOTE — H&P (View-Only) (Signed)
 Gastroenterology Progress Note   Referring Provider: No ref. provider found Primary Care Physician:  [name], FNP Primary Gastroenterologist:  Carlin POUR. Cindie, DO Patient ID: Becky [name]; [id number]; 1960-07-29   Subjective:    Feels ok today. No N/V. No abdominal pain. Stool with some fresh blood noted today. Described as small volume. No black stool.  Objective:   Vital signs in last 24 hours: Temp:  [98 F (36.7 C)-98.9 F (37.2 C)] 98 F (36.7 C) (12/02 1305) Pulse Rate:  [73-91] 73 (12/02 1305) Resp:  [16-18] 16 (12/02 1305) BP: (92-119)/(57-76) 105/75 (12/02 1305) SpO2:  [94 %-100 %] 99 % (12/02 1305) Last BM Date : 12/07/23 General:   Alert,  Well-developed, well-nourished, pleasant and cooperative in NAD Head:  Normocephalic and atraumatic. Eyes:  Sclera clear, no icterus.   Abdomen:  Soft, nontender and nondistended. Normal bowel sounds, without guarding, and without rebound.   Extremities:  Without clubbing, deformity or edema. Neurologic:  Alert and  oriented x4;  grossly normal neurologically. Skin:  Intact without significant lesions or rashes. Psych:  Alert and cooperative. Normal mood and affect.  Intake/Output from previous day: No intake/output data recorded. Intake/Output this shift: No intake/output data recorded.  Lab Results: CBC Recent Labs    12/07/23 0952 12/08/23 0445  WBC 2.5* 1.3*  HGB 8.3* 7.7*  HCT 24.9* 22.8*  MCV 93.3 92.3  PLT 325 243   BMET Recent Labs    12/07/23 0952 12/08/23 0445  NA 139 138  K 3.8 3.3*  CL 104 103  CO2 22 23  GLUCOSE 107* 96  BUN 9 6*  CREATININE 1.20* 1.11*  CALCIUM  10.0 9.4   LFTs Recent Labs    12/07/23 0952  BILITOT 0.6  ALKPHOS 71  AST 35  ALT 25  PROT 6.6  ALBUMIN 4.1   No results for input(s): LIPASE in the last 72 hours. PT/INR Recent Labs    12/08/23 0445  LABPROT 16.3*  INR 1.2         Imaging Studies: CT ANGIO GI BLEED Result Date:  12/07/2023 EXAM: CTA ABDOMEN AND PELVIS WITH CONTRAST 12/07/2023 12:04:43 PM TECHNIQUE: CTA images of the abdomen and pelvis with intravenous contrast. 100 mL (iohexol  (OMNIPAQUE ) 350 MG/ML injection 100 mL IOHEXOL  350 MG/ML SOLN). Three-dimensional MIP/volume rendered formations were performed. Automated exposure control, iterative reconstruction, and/or weight based adjustment of the mA/kV was utilized to reduce the radiation dose to as low as reasonably achievable. COMPARISON: 11/23/2023 and previous. CLINICAL HISTORY: Lower GI bleed. Right lung nonsmall cell carcinoma. FINDINGS: VASCULATURE: GI BLEED: No active extravasation of contrast within the GI tract. AORTA: No acute finding. No abdominal aortic aneurysm. No dissection. CELIAC TRUNK: No acute finding. No occlusion or significant stenosis. SUPERIOR MESENTERIC ARTERY: No acute finding. No occlusion or significant stenosis. INFERIOR MESENTERIC ARTERY: No acute finding. No occlusion or significant stenosis. RENAL ARTERIES: Duplicated left renal arteries, inferior dominant, both patent. No acute finding. No occlusion or significant stenosis. ILIAC ARTERIES: Moderate aortoiliac calcified plaque without aneurysm. No acute finding. No occlusion or significant stenosis. ABDOMEN/PELVIS: LOWER CHEST: Visualized portion of the lower chest demonstrates no acute abnormality. LIVER: 3.2 cm poorly marginated low attenuation lesion in hepatic segment 4b adjacent to the gallbladder fossa. GALLBLADDER AND BILE DUCTS: Gallbladder is unremarkable. No biliary ductal dilatation. SPLEEN: The spleen is unremarkable. PANCREAS: The pancreas is unremarkable. ADRENAL GLANDS: 1.6 cm left adrenal nodule stable since 09/07/23.   no acute abnormality. KIDNEYS, URETERS AND BLADDER: 2.5 cm right lower  pole renal cortical cyst as before. No stones in the kidneys or ureters. No hydronephrosis. No perinephric or periureteral stranding. Urinary bladder is unremarkable. GI AND BOWEL: Stomach and  duodenal sweep demonstrate no acute abnormality. Normal appendix. Some decrease in the inflammatory/edematous changes and wall thickening previously noted involving the proximal transverse colon. Scattered diverticula from the descending and sigmoid segments without adjacent inflammatory change. There is no bowel obstruction. No abnormal bowel wall thickening or distension. REPRODUCTIVE: Reproductive organs are unremarkable. PERITONEUM AND RETROPERITONEUM: No ascites or free air. LYMPH NODES: No lymphadenopathy. BONES AND SOFT TISSUES: Facet DJD in the lower lumbar spineL4-S1. No acute abnormality of the bones. No acute soft tissue abnormality. IMPRESSION: 1. No active GI bleeding. 2. 3.2 cm poorly marginated low attenuation lesion in hepatic segment 4b adjacent to the gallbladder fossa; recommend hepatic protocol MRI for characterization. 3. Some decrease in the previously described inflammatory and edematous changes with wall thickening involving the proximal transverse colon. Electronically signed by: Katheleen Faes MD 12/07/2023 01:43 PM EST RP Workstation: [id number]   CT CHEST ABDOMEN PELVIS WO CONTRAST Result Date: 11/26/2023 CLINICAL DATA:  Non-small cell lung cancer of the right lung EXAM: CT CHEST, ABDOMEN AND PELVIS WITHOUT CONTRAST TECHNIQUE: Multidetector CT imaging of the chest, abdomen and pelvis was performed following the standard protocol without IV contrast. RADIATION DOSE REDUCTION: This exam was performed according to the departmental dose-optimization program which includes automated exposure control, adjustment of the mA and/or kV according to patient size and/or use of iterative reconstruction technique. COMPARISON:  CT abdomen pelvis November 17, 2023. CT chest abdomen pelvis September 18, 2023. FINDINGS: CT CHEST FINDINGS Cardiovascular: The heart size is normal. Atherosclerotic calcifications of coronary arteries. Left-sided porta catheter tip terminates in right atrium. No pericardial  effusion. Mediastinum/Nodes: Few subcentimeter mediastinal lymph nodes. Limited evaluation of bilateral hilar lymph nodes given the lack contrast. Lungs/Pleura: Right upper lobe mass measuring 2.7 x 2 cm grossly stable to prior. 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 New patchy areas of ground-glass attenuation and fibrosis with associated more focal consolidation/irregular nodule is also identified in apical segment of right lower lobe measuring 2.4 x 1.5 cm (6/44) which may represent metastatic disease versus postradiation changes. Left lower lobe subpleural micronodule stable to prior (6/94). No pleural effusion. Musculoskeletal: Sclerotic changes with associated new nondisplaced fracture of right anterior sixth rib consistent with metastasis until proven otherwise. CT ABDOMEN PELVIS FINDINGS Hepatobiliary: No significant hepatic steatosis. Hypodense liver lesion in segment 4 measuring 3.4 x 3 cm, stable to prior and incompletely assessed on current noncontrast CT. Gallbladder sludge/cholelithiasis. No pericholecystic fluid collection. Pancreas: Atrophic changes of the pancreas. No pancreatic ductal dilatation. Spleen: Normal spleen Adrenals/Urinary Tract: Left adrenal lipid rich adenoma measuring 1.4 cm, stable to prior she does not require imaging follow-up. Right adrenal is normal. Right kidney lower pole cortical cyst which does not require imaging follow-up measuring 2.3 cm. No hydronephrosis. Stomach/Bowel: Colonic diverticulosis. There is a short-segment circumferential wall thickening spanning approximately 7.3 cm of the proximal transverse colon in right upper quadrant measuring 7.3 x 4 cm with pericolonic fat stranding 2/66). No bowel obstruction. Normal appendix. Small bowel loops are normal.  Vascular/Lymphatic: Aortic atherosclerosis. No enlarged abdominal or pelvic lymph nodes. Reproductive: Uterine fibroids with coarse calcifications. Normal bilateral adnexa. Other: Tiny fat containing umbilical hernia. Musculoskeletal: See above.  No other osseous lesion identified. IMPRESSION:  Stable appearance of primary known perifissural right upper lobe malignancy. No suspicious new mediastinal or supraclavicular lymphadenopathy. Interval increase in peritumoral right upper and superior segment right lower lobe consolidation/fibrotic changes with traction bronchiectasis which may represent evolving postradiation/posttreatment changes versus progressive disease. Follow-up PET-CT is suggested for further differentiation in 6-8 weeks. Persistent sclerotic changes of right anterior sixth rib with a new nondisplaced fracture (metastasis versus post radiation osteitis). Persistent and grossly stable circumferential short-segment wall thickening with associated pericolonic fat stranding involving the proximal transverse colon may represent focal colitis/diverticulitis versus underlying malignancy. Recommend correlation with clinical findings and colonoscopy. Again seen there is a hypodense liver lesion in segment 4, similar to prior, incompletely assessed on current noncontrast CT. Metastasis can not be excluded. This can be further assessed at the time of PET-CT. Gallbladder sludge/cholelithiasis. Left adrenal nodule stable to prior likely benign/treated disease. Electronically Signed   By: Megan  Zare M.D.   On: 11/26/2023 16:51   CT ABDOMEN PELVIS WO CONTRAST Result Date: 11/17/2023 EXAM: CT ABDOMEN AND PELVIS WITHOUT CONTRAST 11/17/2023 07:45:00 PM TECHNIQUE: CT of the abdomen and pelvis was performed without the administration of intravenous contrast. Multiplanar reformatted images are provided for review. Automated exposure control, iterative reconstruction, and/or weight-based adjustment of the mA/kV was  utilized to reduce the radiation dose to as low as reasonably achievable. COMPARISON: 09/18/2023 CLINICAL HISTORY: Bowel obstruction suspected. FINDINGS: LOWER CHEST: No acute abnormality. LIVER: 3.5 cm low density lesion inferiorly along the gallbladder fossa (image 20), possibly reflecting focal fat, but new from recent prior, metastasis not excluded. GALLBLADDER AND BILE DUCTS: Layering tiny gallstones, without associated inflammatory changes. No biliary ductal dilatation. SPLEEN: No acute abnormality. PANCREAS: No acute abnormality. ADRENAL GLANDS: 16 mm left adrenal nodule (image 24) unchanged from multiple priors, likely reflecting a benign adrenal adenoma. KIDNEYS, URETERS AND BLADDER: No stones in the kidneys or ureters. No hydronephrosis. No perinephric or periureteral stranding. Urinary bladder is unremarkable. GI AND BOWEL: Stomach demonstrates no acute abnormality. Short segment wall thickening with pericolonic inflammatory changes along the proximal transverse colon (image 36), suggesting infectious/inflammatory colitis. Normal appendix (image 55). Sigmoid diverticulosis, without evidence of diverticulitis. There is no bowel obstruction. PERITONEUM AND RETROPERITONEUM: No ascites. No free air. VASCULATURE: Aorta is normal in caliber. Atherosclerotic calcifications of the abdominal aorta and branch vessels. LYMPH NODES: No lymphadenopathy. REPRODUCTIVE ORGANS: The uterus is within normal limits. BONES AND SOFT TISSUES: Mild degenerative changes of the lower thoracic spine. No acute osseous abnormality. No focal soft tissue abnormality. IMPRESSION: 1. Suspected infectious/inflammatory colitis involving the proximal transverse colon. 2. New 3.5 cm low-density lesion along the gallbladder fossa, possibly reflecting focal fat, although metastasis is not excluded. Consider follow-up MR abdomen with/without contrast in 4-6 weeks. Electronically signed by: Pinkie Pebbles MD 11/17/2023 07:52 PM EST RP  Workstation: [id number]   DG Chest Port 1 View Result Date: 11/17/2023 CLINICAL DATA:  Cough. EXAM: PORTABLE CHEST 1 VIEW COMPARISON:  Chest radiograph dated 10/15/2023. FINDINGS: Port-A-Cath in similar position. Right suprahilar streaky density progressed since the prior radiograph. No new consolidation. No pleural effusion pneumothorax. Stable cardiac silhouette no acute osseous pathology. IMPRESSION: No acute cardiopulmonary process. Electronically Signed   By: Vanetta Chou M.D.   On: 11/17/2023 17:50  [2 weeks]  Assessment:    Becky [name] is a 63 y.o. 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  GI has been consulted for further management.   Acute GI bleeding:  -Hgb appears to fluctuate in the high 7-low 9 range and required transfusion 11/18/2023 due to anemia with Hgb 6.9 and felt secondary to chemo. -Hgb up to 9.3 post one unit prbcs 11/19.  -Hgb 8.3 yesterday on presentation. -Hgb 7.7 today -large volume painless rectal bleeding day of admission, today she had soft stool with small amount of fresh blood. Denies melena -CTA 12/07/23 with some decrease in inflammatory and edematous changes in the wall thickening  involving proximal transverse colon -CT without contrast 11/23/23 with  short-segment circumferential wall thickening spanning approximately 7.3 cm of the proximal transverse colon in right upper quadrant measuring 7.3 x 4 cm with pericolonic fat stranding, may represent focal colitis/diverticulitis vs underlying malignancy.  -CT without contrast 11/17/23 suspected infectious/inflammatory colitis involving proximal transverse colon -rectal bleeding, painless, could be due to internal hemorrhoids, diverticular bleeding, less likely ischemic colitis or infectious. Unlikely CT findings secondary to prior radiation of right sixth rib, right lung. -April 2025 colonoscopy on file, but with her recent diagnosis with metastatic disease and ongoing findings of transverse colitis, would consider direct visualization via colonoscopy likely Wednesday, which will give a chance for Xarelto  to wash out. Xarelto  is on hold today, with last dose 11/30.   With her history of embolic strokes twice this year, with most recent in July 2025 while holding Xarelto  prior to port placement, would favor heparin  to start at latest tomorrow morning, as Xarelto  will stay on hold with colonoscopy tentatively planned for Wednesday.     Plan:   Patient agreeable to colonoscopy if recommended. She prefers to not have to undergo the procedure. Explained abnormality seen on last three CTs involving proximal transverse colon. To discuss further with Dr. Eartha. She will need to have heparin  held AT 6AM before her procedure.    LOS: 0 days   Sonny RAMAN. Ezzard RIGGERS [facility] [phone] 12/2/20252:28 PM

## 2023-12-08 NOTE — Progress Notes (Addendum)
 Gastroenterology Progress Note   Referring Provider: No ref. provider found Primary Care Physician:  [name], FNP Primary Gastroenterologist:  Carlin POUR. Cindie, DO Patient ID: Becky [name]; [id number]; 1960-07-29   Subjective:    Feels ok today. No N/V. No abdominal pain. Stool with some fresh blood noted today. Described as small volume. No black stool.  Objective:   Vital signs in last 24 hours: Temp:  [98 F (36.7 C)-98.9 F (37.2 C)] 98 F (36.7 C) (12/02 1305) Pulse Rate:  [73-91] 73 (12/02 1305) Resp:  [16-18] 16 (12/02 1305) BP: (92-119)/(57-76) 105/75 (12/02 1305) SpO2:  [94 %-100 %] 99 % (12/02 1305) Last BM Date : 12/07/23 General:   Alert,  Well-developed, well-nourished, pleasant and cooperative in NAD Head:  Normocephalic and atraumatic. Eyes:  Sclera clear, no icterus.   Abdomen:  Soft, nontender and nondistended. Normal bowel sounds, without guarding, and without rebound.   Extremities:  Without clubbing, deformity or edema. Neurologic:  Alert and  oriented x4;  grossly normal neurologically. Skin:  Intact without significant lesions or rashes. Psych:  Alert and cooperative. Normal mood and affect.  Intake/Output from previous day: No intake/output data recorded. Intake/Output this shift: No intake/output data recorded.  Lab Results: CBC Recent Labs    12/07/23 0952 12/08/23 0445  WBC 2.5* 1.3*  HGB 8.3* 7.7*  HCT 24.9* 22.8*  MCV 93.3 92.3  PLT 325 243   BMET Recent Labs    12/07/23 0952 12/08/23 0445  NA 139 138  K 3.8 3.3*  CL 104 103  CO2 22 23  GLUCOSE 107* 96  BUN 9 6*  CREATININE 1.20* 1.11*  CALCIUM  10.0 9.4   LFTs Recent Labs    12/07/23 0952  BILITOT 0.6  ALKPHOS 71  AST 35  ALT 25  PROT 6.6  ALBUMIN 4.1   No results for input(s): LIPASE in the last 72 hours. PT/INR Recent Labs    12/08/23 0445  LABPROT 16.3*  INR 1.2         Imaging Studies: CT ANGIO GI BLEED Result Date:  12/07/2023 EXAM: CTA ABDOMEN AND PELVIS WITH CONTRAST 12/07/2023 12:04:43 PM TECHNIQUE: CTA images of the abdomen and pelvis with intravenous contrast. 100 mL (iohexol  (OMNIPAQUE ) 350 MG/ML injection 100 mL IOHEXOL  350 MG/ML SOLN). Three-dimensional MIP/volume rendered formations were performed. Automated exposure control, iterative reconstruction, and/or weight based adjustment of the mA/kV was utilized to reduce the radiation dose to as low as reasonably achievable. COMPARISON: 11/23/2023 and previous. CLINICAL HISTORY: Lower GI bleed. Right lung nonsmall cell carcinoma. FINDINGS: VASCULATURE: GI BLEED: No active extravasation of contrast within the GI tract. AORTA: No acute finding. No abdominal aortic aneurysm. No dissection. CELIAC TRUNK: No acute finding. No occlusion or significant stenosis. SUPERIOR MESENTERIC ARTERY: No acute finding. No occlusion or significant stenosis. INFERIOR MESENTERIC ARTERY: No acute finding. No occlusion or significant stenosis. RENAL ARTERIES: Duplicated left renal arteries, inferior dominant, both patent. No acute finding. No occlusion or significant stenosis. ILIAC ARTERIES: Moderate aortoiliac calcified plaque without aneurysm. No acute finding. No occlusion or significant stenosis. ABDOMEN/PELVIS: LOWER CHEST: Visualized portion of the lower chest demonstrates no acute abnormality. LIVER: 3.2 cm poorly marginated low attenuation lesion in hepatic segment 4b adjacent to the gallbladder fossa. GALLBLADDER AND BILE DUCTS: Gallbladder is unremarkable. No biliary ductal dilatation. SPLEEN: The spleen is unremarkable. PANCREAS: The pancreas is unremarkable. ADRENAL GLANDS: 1.6 cm left adrenal nodule stable since 09/07/23.   no acute abnormality. KIDNEYS, URETERS AND BLADDER: 2.5 cm right lower  pole renal cortical cyst as before. No stones in the kidneys or ureters. No hydronephrosis. No perinephric or periureteral stranding. Urinary bladder is unremarkable. GI AND BOWEL: Stomach and  duodenal sweep demonstrate no acute abnormality. Normal appendix. Some decrease in the inflammatory/edematous changes and wall thickening previously noted involving the proximal transverse colon. Scattered diverticula from the descending and sigmoid segments without adjacent inflammatory change. There is no bowel obstruction. No abnormal bowel wall thickening or distension. REPRODUCTIVE: Reproductive organs are unremarkable. PERITONEUM AND RETROPERITONEUM: No ascites or free air. LYMPH NODES: No lymphadenopathy. BONES AND SOFT TISSUES: Facet DJD in the lower lumbar spineL4-S1. No acute abnormality of the bones. No acute soft tissue abnormality. IMPRESSION: 1. No active GI bleeding. 2. 3.2 cm poorly marginated low attenuation lesion in hepatic segment 4b adjacent to the gallbladder fossa; recommend hepatic protocol MRI for characterization. 3. Some decrease in the previously described inflammatory and edematous changes with wall thickening involving the proximal transverse colon. Electronically signed by: Katheleen Faes MD 12/07/2023 01:43 PM EST RP Workstation: [id number]   CT CHEST ABDOMEN PELVIS WO CONTRAST Result Date: 11/26/2023 CLINICAL DATA:  Non-small cell lung cancer of the right lung EXAM: CT CHEST, ABDOMEN AND PELVIS WITHOUT CONTRAST TECHNIQUE: Multidetector CT imaging of the chest, abdomen and pelvis was performed following the standard protocol without IV contrast. RADIATION DOSE REDUCTION: This exam was performed according to the departmental dose-optimization program which includes automated exposure control, adjustment of the mA and/or kV according to patient size and/or use of iterative reconstruction technique. COMPARISON:  CT abdomen pelvis November 17, 2023. CT chest abdomen pelvis September 18, 2023. FINDINGS: CT CHEST FINDINGS Cardiovascular: The heart size is normal. Atherosclerotic calcifications of coronary arteries. Left-sided porta catheter tip terminates in right atrium. No pericardial  effusion. Mediastinum/Nodes: Few subcentimeter mediastinal lymph nodes. Limited evaluation of bilateral hilar lymph nodes given the lack contrast. Lungs/Pleura: Right upper lobe mass measuring 2.7 x 2 cm grossly stable to prior. 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 New patchy areas of ground-glass attenuation and fibrosis with associated more focal consolidation/irregular nodule is also identified in apical segment of right lower lobe measuring 2.4 x 1.5 cm (6/44) which may represent metastatic disease versus postradiation changes. Left lower lobe subpleural micronodule stable to prior (6/94). No pleural effusion. Musculoskeletal: Sclerotic changes with associated new nondisplaced fracture of right anterior sixth rib consistent with metastasis until proven otherwise. CT ABDOMEN PELVIS FINDINGS Hepatobiliary: No significant hepatic steatosis. Hypodense liver lesion in segment 4 measuring 3.4 x 3 cm, stable to prior and incompletely assessed on current noncontrast CT. Gallbladder sludge/cholelithiasis. No pericholecystic fluid collection. Pancreas: Atrophic changes of the pancreas. No pancreatic ductal dilatation. Spleen: Normal spleen Adrenals/Urinary Tract: Left adrenal lipid rich adenoma measuring 1.4 cm, stable to prior she does not require imaging follow-up. Right adrenal is normal. Right kidney lower pole cortical cyst which does not require imaging follow-up measuring 2.3 cm. No hydronephrosis. Stomach/Bowel: Colonic diverticulosis. There is a short-segment circumferential wall thickening spanning approximately 7.3 cm of the proximal transverse colon in right upper quadrant measuring 7.3 x 4 cm with pericolonic fat stranding 2/66). No bowel obstruction. Normal appendix. Small bowel loops are normal.  Vascular/Lymphatic: Aortic atherosclerosis. No enlarged abdominal or pelvic lymph nodes. Reproductive: Uterine fibroids with coarse calcifications. Normal bilateral adnexa. Other: Tiny fat containing umbilical hernia. Musculoskeletal: See above.  No other osseous lesion identified. IMPRESSION:  Stable appearance of primary known perifissural right upper lobe malignancy. No suspicious new mediastinal or supraclavicular lymphadenopathy. Interval increase in peritumoral right upper and superior segment right lower lobe consolidation/fibrotic changes with traction bronchiectasis which may represent evolving postradiation/posttreatment changes versus progressive disease. Follow-up PET-CT is suggested for further differentiation in 6-8 weeks. Persistent sclerotic changes of right anterior sixth rib with a new nondisplaced fracture (metastasis versus post radiation osteitis). Persistent and grossly stable circumferential short-segment wall thickening with associated pericolonic fat stranding involving the proximal transverse colon may represent focal colitis/diverticulitis versus underlying malignancy. Recommend correlation with clinical findings and colonoscopy. Again seen there is a hypodense liver lesion in segment 4, similar to prior, incompletely assessed on current noncontrast CT. Metastasis can not be excluded. This can be further assessed at the time of PET-CT. Gallbladder sludge/cholelithiasis. Left adrenal nodule stable to prior likely benign/treated disease. Electronically Signed   By: Megan  Zare M.D.   On: 11/26/2023 16:51   CT ABDOMEN PELVIS WO CONTRAST Result Date: 11/17/2023 EXAM: CT ABDOMEN AND PELVIS WITHOUT CONTRAST 11/17/2023 07:45:00 PM TECHNIQUE: CT of the abdomen and pelvis was performed without the administration of intravenous contrast. Multiplanar reformatted images are provided for review. Automated exposure control, iterative reconstruction, and/or weight-based adjustment of the mA/kV was  utilized to reduce the radiation dose to as low as reasonably achievable. COMPARISON: 09/18/2023 CLINICAL HISTORY: Bowel obstruction suspected. FINDINGS: LOWER CHEST: No acute abnormality. LIVER: 3.5 cm low density lesion inferiorly along the gallbladder fossa (image 20), possibly reflecting focal fat, but new from recent prior, metastasis not excluded. GALLBLADDER AND BILE DUCTS: Layering tiny gallstones, without associated inflammatory changes. No biliary ductal dilatation. SPLEEN: No acute abnormality. PANCREAS: No acute abnormality. ADRENAL GLANDS: 16 mm left adrenal nodule (image 24) unchanged from multiple priors, likely reflecting a benign adrenal adenoma. KIDNEYS, URETERS AND BLADDER: No stones in the kidneys or ureters. No hydronephrosis. No perinephric or periureteral stranding. Urinary bladder is unremarkable. GI AND BOWEL: Stomach demonstrates no acute abnormality. Short segment wall thickening with pericolonic inflammatory changes along the proximal transverse colon (image 36), suggesting infectious/inflammatory colitis. Normal appendix (image 55). Sigmoid diverticulosis, without evidence of diverticulitis. There is no bowel obstruction. PERITONEUM AND RETROPERITONEUM: No ascites. No free air. VASCULATURE: Aorta is normal in caliber. Atherosclerotic calcifications of the abdominal aorta and branch vessels. LYMPH NODES: No lymphadenopathy. REPRODUCTIVE ORGANS: The uterus is within normal limits. BONES AND SOFT TISSUES: Mild degenerative changes of the lower thoracic spine. No acute osseous abnormality. No focal soft tissue abnormality. IMPRESSION: 1. Suspected infectious/inflammatory colitis involving the proximal transverse colon. 2. New 3.5 cm low-density lesion along the gallbladder fossa, possibly reflecting focal fat, although metastasis is not excluded. Consider follow-up MR abdomen with/without contrast in 4-6 weeks. Electronically signed by: Pinkie Pebbles MD 11/17/2023 07:52 PM EST RP  Workstation: [id number]   DG Chest Port 1 View Result Date: 11/17/2023 CLINICAL DATA:  Cough. EXAM: PORTABLE CHEST 1 VIEW COMPARISON:  Chest radiograph dated 10/15/2023. FINDINGS: Port-A-Cath in similar position. Right suprahilar streaky density progressed since the prior radiograph. No new consolidation. No pleural effusion pneumothorax. Stable cardiac silhouette no acute osseous pathology. IMPRESSION: No acute cardiopulmonary process. Electronically Signed   By: Vanetta Chou M.D.   On: 11/17/2023 17:50  [2 weeks]  Assessment:    Becky [name] is a 63 y.o. 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  GI has been consulted for further management.   Acute GI bleeding:  -Hgb appears to fluctuate in the high 7-low 9 range and required transfusion 11/18/2023 due to anemia with Hgb 6.9 and felt secondary to chemo. -Hgb up to 9.3 post one unit prbcs 11/19.  -Hgb 8.3 yesterday on presentation. -Hgb 7.7 today -large volume painless rectal bleeding day of admission, today she had soft stool with small amount of fresh blood. Denies melena -CTA 12/07/23 with some decrease in inflammatory and edematous changes in the wall thickening  involving proximal transverse colon -CT without contrast 11/23/23 with  short-segment circumferential wall thickening spanning approximately 7.3 cm of the proximal transverse colon in right upper quadrant measuring 7.3 x 4 cm with pericolonic fat stranding, may represent focal colitis/diverticulitis vs underlying malignancy.  -CT without contrast 11/17/23 suspected infectious/inflammatory colitis involving proximal transverse colon -rectal bleeding, painless, could be due to internal hemorrhoids, diverticular bleeding, less likely ischemic colitis or infectious. Unlikely CT findings secondary to prior radiation of right sixth rib, right lung. -April 2025 colonoscopy on file, but with her recent diagnosis with metastatic disease and ongoing findings of transverse colitis, would consider direct visualization via colonoscopy likely Wednesday, which will give a chance for Xarelto  to wash out. Xarelto  is on hold today, with last dose 11/30.   With her history of embolic strokes twice this year, with most recent in July 2025 while holding Xarelto  prior to port placement, would favor heparin  to start at latest tomorrow morning, as Xarelto  will stay on hold with colonoscopy tentatively planned for Wednesday.     Plan:   Patient agreeable to colonoscopy if recommended. She prefers to not have to undergo the procedure. Explained abnormality seen on last three CTs involving proximal transverse colon. To discuss further with Dr. Eartha. She will need to have heparin  held AT 6AM before her procedure.    LOS: 0 days   Sonny RAMAN. Ezzard RIGGERS [facility] [phone] 12/2/20252:28 PM

## 2023-12-09 ENCOUNTER — Encounter (HOSPITAL_COMMUNITY): Payer: Self-pay | Admitting: Internal Medicine

## 2023-12-09 ENCOUNTER — Inpatient Hospital Stay (HOSPITAL_COMMUNITY): Admitting: Certified Registered"

## 2023-12-09 ENCOUNTER — Encounter (HOSPITAL_COMMUNITY): Admission: EM | Disposition: A | Payer: Self-pay | Source: Home / Self Care | Attending: Internal Medicine

## 2023-12-09 DIAGNOSIS — K5730 Diverticulosis of large intestine without perforation or abscess without bleeding: Secondary | ICD-10-CM

## 2023-12-09 DIAGNOSIS — K921 Melena: Secondary | ICD-10-CM | POA: Diagnosis not present

## 2023-12-09 DIAGNOSIS — K641 Second degree hemorrhoids: Secondary | ICD-10-CM | POA: Diagnosis not present

## 2023-12-09 HISTORY — PX: COLONOSCOPY: SHX5424

## 2023-12-09 LAB — CBC
HCT: 22.2 % — ABNORMAL LOW (ref 36.0–46.0)
Hemoglobin: 7.5 g/dL — ABNORMAL LOW (ref 12.0–15.0)
MCH: 30.9 pg (ref 26.0–34.0)
MCHC: 33.8 g/dL (ref 30.0–36.0)
MCV: 91.4 fL (ref 80.0–100.0)
Platelets: 206 K/uL (ref 150–400)
RBC: 2.43 MIL/uL — ABNORMAL LOW (ref 3.87–5.11)
RDW: 14.1 % (ref 11.5–15.5)
WBC: 1.3 K/uL — CL (ref 4.0–10.5)
nRBC: 0 % (ref 0.0–0.2)

## 2023-12-09 LAB — BASIC METABOLIC PANEL WITH GFR
Anion gap: 14 (ref 5–15)
BUN: 6 mg/dL — ABNORMAL LOW (ref 8–23)
CO2: 21 mmol/L — ABNORMAL LOW (ref 22–32)
Calcium: 9.2 mg/dL (ref 8.9–10.3)
Chloride: 107 mmol/L (ref 98–111)
Creatinine, Ser: 1.06 mg/dL — ABNORMAL HIGH (ref 0.44–1.00)
GFR, Estimated: 59 mL/min — ABNORMAL LOW (ref >60)
Glucose, Bld: 91 mg/dL (ref 70–99)
Potassium: 3.2 mmol/L — ABNORMAL LOW (ref 3.5–5.1)
Sodium: 142 mmol/L (ref 135–145)

## 2023-12-09 LAB — MAGNESIUM: Magnesium: 1.7 mg/dL (ref 1.7–2.4)

## 2023-12-09 LAB — GLUCOSE, CAPILLARY: Glucose-Capillary: 99 mg/dL (ref 70–99)

## 2023-12-09 MED ORDER — PROPOFOL 10 MG/ML IV BOLUS
INTRAVENOUS | Status: DC | PRN
  Administered 2023-12-09: 50 mg via INTRAVENOUS
  Administered 2023-12-09: 100 ug/kg/min via INTRAVENOUS

## 2023-12-09 MED ORDER — KETAMINE HCL 10 MG/ML IJ SOLN
INTRAMUSCULAR | Status: DC | PRN
  Administered 2023-12-09: 30 mg via INTRAVENOUS

## 2023-12-09 MED ORDER — LIDOCAINE HCL (CARDIAC) PF 100 MG/5ML IV SOSY
PREFILLED_SYRINGE | INTRAVENOUS | Status: DC | PRN
  Administered 2023-12-09: 10 mg via INTRAVENOUS

## 2023-12-09 MED ORDER — VASOPRESSIN 20 UNIT/ML IV SOLN
INTRAVENOUS | Status: DC | PRN
  Administered 2023-12-09 (×2): 1 [IU] via INTRAVENOUS

## 2023-12-09 MED ORDER — POTASSIUM CHLORIDE 20 MEQ PO PACK
40.0000 meq | PACK | Freq: Once | ORAL | Status: AC
  Administered 2023-12-09: 40 meq via ORAL
  Filled 2023-12-09: qty 2

## 2023-12-09 MED ORDER — RIVAROXABAN 20 MG PO TABS
20.0000 mg | ORAL_TABLET | Freq: Every evening | ORAL | Status: DC
  Administered 2023-12-09: 20 mg via ORAL
  Filled 2023-12-09: qty 1

## 2023-12-09 MED ORDER — KETAMINE HCL 50 MG/5ML IJ SOSY
PREFILLED_SYRINGE | INTRAMUSCULAR | Status: AC
  Filled 2023-12-09: qty 5

## 2023-12-09 MED ORDER — LACTATED RINGERS IV SOLN: INTRAVENOUS | Status: DC | PRN

## 2023-12-09 MED ORDER — STERILE WATER FOR IRRIGATION IR SOLN
Status: DC | PRN
  Administered 2023-12-09: 120 mL

## 2023-12-09 MED ORDER — FILGRASTIM-AAFI 480 MCG/0.8ML IJ SOSY
480.0000 ug | PREFILLED_SYRINGE | Freq: Every day | INTRAMUSCULAR | Status: DC
  Administered 2023-12-09: 480 ug via SUBCUTANEOUS
  Filled 2023-12-09 (×2): qty 0.8

## 2023-12-09 NOTE — Interval H&P Note (Signed)
 History and Physical Interval Note:  12/09/2023 9:11 AM  Becky [name]  has presented today for surgery, with the diagnosis of abnormal transverse colon on imaging, rectal bleeding.  The various methods of treatment have been discussed with the patient and family. After consideration of risks, benefits and other options for treatment, the patient has consented to  Procedure(s): COLONOSCOPY (N/A) as a surgical intervention.  The patient's history has been reviewed, patient examined, no change in status, stable for surgery.  I have reviewed the patient's chart and labs.  Questions were answered to the patient's satisfaction.     Lamar Hollingshead    Patient seen and examined in short stay.  Stable overnight less of blood per rectum with prep.  Hemoglobin this morning 7.5 white count notably 1.3 agree with need for diagnostic colonoscopy.  The risks, benefits, limitations, alternatives and imponderables have been reviewed with the patient. Questions have been answered. All parties are agreeable.

## 2023-12-09 NOTE — Transfer of Care (Signed)
 Immediate Anesthesia Transfer of Care Note  [name]  Procedure(s) Performed: COLONOSCOPY  Patient Location: PACU  Anesthesia Type:General  Level of Consciousness: awake and patient cooperative  Airway & Oxygen Therapy: Patient Spontanous Breathing  Post-op Assessment: Report given to RN and Post -op Vital signs reviewed and stable  Post vital signs: Reviewed and stable  Last Vitals:  Vitals Value Taken Time  BP 95/54 12/09/23 09:39  Temp 36.8 C 12/09/23 09:39  Pulse    Resp 23 12/09/23 09:39  SpO2 100 % 12/09/23 09:39    Last Pain:  Vitals:   12/09/23 0914  TempSrc:   PainSc: 0-No pain         Complications: No notable events documented.

## 2023-12-09 NOTE — Progress Notes (Addendum)
 [facility] PROGRESS NOTE  Becky [name] (DOB: [birth year]) FMW:[id number] PCP: [name], FNP Brief Narrative: Becky [name] is a 63 y.o. 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   Subjective: No complaints whatsoever. No hungry, but could eat (postprocedure). No abdominal or other pain, no nausea. No bleeding.   Objective: BP 98/64 (BP Location: Left Arm)   Pulse 82   Temp 99 F (37.2 C) (Oral)   Resp 17   Ht 5' 7 (1.702 m)   Wt 98.4 kg   SpO2 98%   BMI 33.98 kg/m   Gen: No distress Pulm: Clear, nonlabored  CV: Irreg irreg, no MRG or pitting edema GI: Soft, protuberant without distention or tenderness, ++BS Neuro: Alert and oriented. No new focal deficits. Ext: Warm, no deformities. Skin: No rashes, lesions or ulcers on visualized skin   Colonoscopy 12/09/2023 Dr. Shaaron: Impression:        - Non-bleeding internal hemorrhoids.                           - Diverticulosis in the sigmoid colon, descending                            and distal transverse segments.                           - The examination was otherwise normal on direct                            and retroflexion views.                           - No specimens collected. Today's findings are                            reassuring. I suspect there is a element of                            artifact called on the CT scan as far as colonic                            wall thickening is concerned. Patient absolutely                            reaffirms no abdominal pain whatsoever which makes                            an ischemic process much, much less  likely.                           - May have had a diverticular bleed. May also bled  from hemorrhoids with her history of straining.  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  - Monitor CBC - Ok to resume DOAC today, will watch for recurrent bleeding overnight, possible DC 12/4 per Dr. Shaaron.  - CLD, ADAT  Leukopenia: Due to chemotherapy. No fever.  - Defer GCSF use to oncology.   ABLA on anemia due to antineoplastic chemotherapy:  - Trend CBC  Chronic AFib:  - Restart DOAC. Had heparin  bridging despite some component of active GI bleeding since she's had CVA in the same setting previously when holding anticoagulation. - Continue metoprolol , diltiazem . Consider Watchman.   T2DM:  - SSI.   Hypothyroidism: Recent TSH wnl.  - Continue synthroid .   Stage IIIa CKD:  - Avoid nephrotoxins  Chronic constipation:  - Daily fiber supplementation and prn laxatives per GI.   Hypokalemia:  - Supplemented today, recheck in AM  Bernardino KATHEE Come, MD [facility] [email or web address] 12/09/2023, 12:43 PM

## 2023-12-09 NOTE — Plan of Care (Signed)

## 2023-12-09 NOTE — Consult Note (Signed)
 PHARMACY - ANTICOAGULATION  Pharmacy Consult for Heparin  Indication: atrial fibrillation Brief A/P: Heparin  level within goal range Continue Heparin  at current rate   Allergies  Allergen Reactions   Wound Dressing Adhesive Other (See Comments)    Ripped skin    Apresoline  [Hydralazine ] Nausea Only   Latex Other (See Comments)    Blisters/burns/welts    Other Other (See Comments)    Band aids discolor skin EKG pads irritate skin    Prednisone  Swelling    Patient states that she can take methylprednisolone  without complications She has tolerated PO dexamethasone  well. No complaints of swelling voiced from patient.    Patient Measurements: Height: 5' 7 (170.2 cm) Weight: 98.4 kg (217 lb) IBW/kg (Calculated) : 61.6 HEPARIN  DW (KG): 83.4  Vital Signs: Temp: 98.4 F (36.9 C) (12/02 2011) Temp Source: Oral (12/02 2011) BP: 137/96 (12/02 2011) Pulse Rate: 105 (12/02 2011)  Labs: Recent Labs    12/07/23 0952 12/08/23 0445 12/08/23 1420 12/08/23 2227  HGB 8.3* 7.7*  --   --   HCT 24.9* 22.8*  --   --   PLT 325 243  --   --   APTT  --  30 61* 80*  LABPROT  --  16.3*  --   --   INR  --  1.2  --   --   HEPARINUNFRC  --  >1.10*  --   --   CREATININE 1.20* 1.11*  --   --     Estimated Creatinine Clearance: 63.3 mL/min (A) (by C-G formula based on SCr of 1.11 mg/dL (H)).  Assessment: 63 y.o. year old female with a history of afib, Xarelto  on hold, for heparin  Goal of Therapy:  Heparin  level 0.3-0.7 units/ml  aPTT 66-102 seconds Monitor platelets by anticoagulation protocol: Yes   Plan:  No change to heparin    Cathlyn Arrant, PharmD, BCPS  12/09/2023,12:07 AM

## 2023-12-09 NOTE — Op Note (Signed)
 [facility] Patient Name: Becky [name] Procedure Date: 12/09/2023 8:55 AM MRN: [id number] Date of Birth: 1960/08/05 Attending MD: Lamar Ozell Hollingshead , MD, [id number] CSN: [id number] Age: 63 Admit Type: Inpatient Procedure:                Colonoscopy Indications:              Hematochezia Providers:                Lamar Ozell Hollingshead, MD, Madelin Hunter, RN, Bascom Blush Referring MD:              Medicines:                Propofol  per Anesthesia Complications:            No immediate complications. Estimated Blood Loss:     Estimated blood loss: none. Procedure:                Pre-Anesthesia Assessment:                           - Prior to the procedure, a History and Physical                            was performed, and patient medications and                            allergies were reviewed. The patient's tolerance of                            previous anesthesia was also reviewed. The risks                            and benefits of the procedure and the sedation                            options and risks were discussed with the patient.                            All questions were answered, and informed consent                            was obtained. Prior Anticoagulants: The patient                            last took heparin  1 day prior to the procedure. ASA                            Grade Assessment: IV - A patient with severe                            systemic disease that is a constant threat to life.  After reviewing the risks and benefits, the patient                            was deemed in satisfactory condition to undergo the                            procedure.                           After obtaining informed consent, the colonoscope                            was passed under direct vision. Throughout the                            procedure, the patient's blood pressure, pulse, and                             oxygen saturations were monitored continuously. The                            [id number] ([id number]) Colon was introduced through                            the anus and advanced to the 5 cm into the ileum.                            The colonoscopy was performed without difficulty.                            The patient tolerated the procedure well. The                            quality of the bowel preparation was adequate. The                            terminal ileum, ileocecal valve, appendiceal                            orifice, and rectum were photographed. Scope In: 9:20:28 AM Scope Out: 9:35:18 AM Scope Withdrawal Time: 0 hours 9 minutes 15 seconds  Total Procedure Duration: 0 hours 14 minutes 50 seconds  Findings:      The perianal and digital rectal examinations were normal.      Non-bleeding internal hemorrhoids were found during retroflexion. The       hemorrhoids were moderate, medium-sized and Grade II (internal       hemorrhoids that prolapse but reduce spontaneously).      Scattered medium-mouthed diverticula were found in the sigmoid colon,       transverse colon and mid transverse colon. Quite a bit of active       motility. No blood in the lower GI tract. No suspect lesion found.       distal 5 cm of TI Normal/no blood.      The exam was otherwise without abnormality on direct and retroflexion  views. Impression:               - Non-bleeding internal hemorrhoids.                           - Diverticulosis in the sigmoid colon, descending                            and distal transverse segments.                           - The examination was otherwise normal on direct                            and retroflexion views.                           - No specimens collected. Today's findings are                            reassuring. I suspect there is a element of                            artifact called on the CT scan as far as colonic                             wall thickening is concerned. Patient absolutely                            reaffirms no abdominal pain whatsoever which makes                            an ischemic process much, much less likely.                           - May have had a diverticular bleed. May also bled                            from hemorrhoids with her history of straining. Moderate Sedation:      Moderate (conscious) sedation was personally administered by an       anesthesia professional. The following parameters were monitored: oxygen       saturation, heart rate, blood pressure, respiratory rate, EKG, adequacy       of pulmonary ventilation, and response to care. Recommendation:           - Return patient to hospital ward for observation.                            Daily fiber supplement. Laxatives as needed to                            avoid constipation and straining.                           - Observation another 24 hours. Clear liquid diet  for lunch then advance as tolerated. Resume Xarelto                             today. Would hold off on heparin  bridging. We can                            evaluate further if signs and symptoms of bleeding                            recurs. At patient request I called Etosha Wetherell                            (daughter) at [phone] -reviewed findings and                            recommendations. Her questions were answered. Procedure Code(s):        --- Professional ---                           [id number], Colonoscopy, flexible; diagnostic, including                            collection of specimen(s) by brushing or washing,                            when performed (separate procedure) Diagnosis Code(s):        --- Professional ---                           K64.1, Second degree hemorrhoids                           K92.1, Melena (includes Hematochezia)                           K57.30, Diverticulosis of large intestine  without                            perforation or abscess without bleeding CPT copyright 2022 [employer]. All rights reserved. The codes documented in this report are preliminary and upon coder review may  be revised to meet current compliance requirements. Lamar HERO. Blakely Maranan, MD Lamar Ozell Hollingshead, MD 12/09/2023 10:01:25 AM This report has been signed electronically. Number of Addenda: 0

## 2023-12-09 NOTE — Consult Note (Signed)
 PHARMACY - ANTICOAGULATION CONSULT NOTE  Pharmacy Consult for Heparin => xarelto  Indication: history of embolic stroke X 2 when off of Xarelto  short periods of time.  Allergies  Allergen Reactions   Wound Dressing Adhesive Other (See Comments)    Ripped skin    Apresoline  [Hydralazine ] Nausea Only   Latex Other (See Comments)    Blisters/burns/welts    Other Other (See Comments)    Band aids discolor skin EKG pads irritate skin    Prednisone  Swelling    Patient states that she can take methylprednisolone  without complications She has tolerated PO dexamethasone  well. No complaints of swelling voiced from patient.    Patient Measurements: Height: 5' 7 (170.2 cm) Weight: 98.4 kg (216 lb 14.9 oz) IBW/kg (Calculated) : 61.6 HEPARIN  DW (KG): 83.4  Vital Signs: Temp: 98.2 F (36.8 C) (12/03 0939) Temp Source: Oral (12/03 0826) BP: 95/57 (12/03 0945) Pulse Rate: 107 (12/03 0826)  Labs: Recent Labs    12/07/23 0952 12/08/23 0445 12/08/23 1420 12/08/23 2227 12/09/23 0437  HGB 8.3* 7.7*  --   --  7.5*  HCT 24.9* 22.8*  --   --  22.2*  PLT 325 243  --   --  206  APTT  --  30 61* 80*  --   LABPROT  --  16.3*  --   --   --   INR  --  1.2  --   --   --   HEPARINUNFRC  --  >1.10*  --   --   --   CREATININE 1.20* 1.11*  --   --  1.06*    Estimated Creatinine Clearance: 66.3 mL/min (A) (by C-G formula based on SCr of 1.06 mg/dL (H)).   Medical History: Past Medical History:  Diagnosis Date   Anemia    Arthritis    Asthma    Atrial fibrillation (HCC) 2018   Back pain    BV (bacterial vaginosis) 05/23/2013   Constipation 11/21/2014   Coronary artery disease    Current use of long term anticoagulation 2018   Diabetes mellitus without complication (HCC)    Dysphagia 2011   Fibroids 03/13/2016   GERD (gastroesophageal reflux disease)    Goiter 2009   Hematochezia 2011   Hematuria 05/23/2013   History of radiation therapy    Right lung, right ribs, brain-  07/08/23-07/29/23- Dr. 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 diagnosed in 11/2016 --> started on Xarelto  for anticoagulation   Pelvic pain in female 11/02/2013   Plantar fasciitis of right foot    Pre-diabetes    Sleep apnea    dont use cpap says causes sinus infection   Stroke (HCC)    04/07/23; 06/2023; 07/2023    Medications:  Medications Prior to Admission  Medication Sig Dispense Refill Last Dose/Taking   acetaminophen  (TYLENOL ) 500 MG tablet Take 1,000 mg by mouth every 6 (six) hours as needed for mild pain (pain score 1-3).   Unknown   albuterol  (VENTOLIN  HFA) 108 (90 Base) MCG/ACT inhaler Inhale 2 puffs into the lungs every 4 (four) hours as needed for wheezing or shortness of breath. 18 g 0 Unknown   aspirin  EC 81 MG tablet Take 1  tablet (81 mg total) by mouth daily. Swallow whole. 30 tablet 12 12/06/2023 Morning   Azelastine  HCl 137 MCG/SPRAY SOLN Place 1 spray into both nostrils daily.   Unknown   dexlansoprazole  (DEXILANT ) 60 MG capsule Take 1 capsule (60 mg total) by mouth daily. 90 capsule 3 12/06/2023 Morning   diltiazem  (TIADYLT  ER) 360 MG 24 hr capsule TAKE ONE (1) CAPSULE BY MOUTH ONCE DAILY *NEW PRESCRIPTION REQUEST* 90 capsule 3 12/09/2023 Morning   EPINEPHrine  0.3 mg/0.3 mL IJ SOAJ injection Inject 0.3 mg into the muscle once as needed for anaphylaxis.   Unknown   fluconazole  (DIFLUCAN ) 100 MG tablet Take 100 mg by mouth daily.   Unknown   folic acid  (FOLVITE ) 1 MG tablet Take 1 tablet (1 mg total) by mouth daily. 90 tablet 2 12/06/2023 Morning   lidocaine -prilocaine  (EMLA ) cream APPLY TOPICALLY TO AFFECTED AREAS ONCE DAILY (Patient taking differently: Apply 1 Application topically See admin instructions. As  needed for port) 30 g 11 Past Week   linaclotide  (LINZESS ) 72 MCG capsule Take 1 capsule (72 mcg total) by mouth daily before breakfast. 90 capsule 3 Unknown   magnesium  oxide (MAG-OX) 400 (240 Mg) MG tablet Take 1 tablet (400 mg total) by mouth 2 (two) times daily. 30 tablet 1 12/06/2023 Evening   metoprolol  tartrate (LOPRESSOR ) 25 MG tablet Take 0.5 tablets (12.5 mg total) by mouth 2 (two) times daily as needed. (Patient taking differently: Take 12.5 mg by mouth 2 (two) times daily as needed (heart rate).)   Unknown   montelukast  (SINGULAIR ) 10 MG tablet Take 10 mg by mouth at bedtime.   12/06/2023 Bedtime   NON FORMULARY Pt uses a cpap nightly   12/06/2023 Bedtime   ondansetron  (ZOFRAN ) 8 MG tablet Take 1 tablet (8 mg total) by mouth every 8 (eight) hours as needed for nausea or vomiting. 30 tablet 1 Past Week   polyethylene glycol (MIRALAX ) 17 g packet Take 17 g by mouth 2 (two) times daily. 30 each 1 Unknown   potassium chloride  SA (KLOR-CON  M) 20 MEQ tablet Take 1 tablet (20 mEq total) by mouth 2 (two) times daily. 16 tablet 0 12/06/2023 Evening   prochlorperazine  (COMPAZINE ) 10 MG tablet Take 1 tablet (10 mg total) by mouth every 6 (six) hours as needed. 30 tablet 11 12/06/2023 Evening   RESTASIS  0.05 % ophthalmic emulsion Place 1 drop into both eyes daily as needed (dry eyes).   Unknown   rivaroxaban  (XARELTO ) 20 MG TABS tablet Take 1 tablet (20 mg total) by mouth daily. 90 tablet 1 12/06/2023 at 10:15 PM   rosuvastatin  (CRESTOR ) 20 MG tablet Take 1 tablet (20 mg total) by mouth daily. 90 tablet 3 12/06/2023 Evening   SENEXON-S 8.6-50 MG tablet Take 1 tablet by mouth 2 (two) times daily. (Patient taking differently: Take 1 tablet by mouth as needed for moderate constipation.) 60 tablet 0 Unknown   SYNTHROID  25 MCG tablet Take 1 tablet (25 mcg total) by mouth daily before breakfast. 90 tablet 1 Past Week   Scheduled:   azelastine   1 spray Each Nare Daily   diltiazem   360 mg Oral Daily    folic acid   1 mg Oral Daily   levothyroxine   25 mcg Oral QAC breakfast   magnesium  oxide  400 mg Oral BID   montelukast   10 mg Oral QHS   pantoprazole   40 mg Oral Daily   rosuvastatin   20 mg Oral Daily   Infusions:   sodium chloride  Stopped (12/09/23 9191)  PRN: acetaminophen , ondansetron  **OR** ondansetron  (ZOFRAN ) IV Anti-infectives (From admission, onward)    None       Assessment: 64 y.o. 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 Heparin  consult states to start first thing in the morning. CBC stable. Will use aPTT to monitor heparin , last dose per consult was 11/30 evening.   Ok to restart xarelto  per GI today. Colonoscopy revealed no active bleeding of internal hemorrhoids, also + diverticuli but no active bleeding seen.  Goal of Therapy:  Monitor platelets by anticoagulation protocol: Yes   Plan:  Xarelto  20mg  po daily at 1800 Continue to monitor H&H and platelets  Carman Auxier, BS Pharm D, BCPS Clinical Pharmacist 12/09/2023,10:15 AM

## 2023-12-09 NOTE — Anesthesia Preprocedure Evaluation (Addendum)
 Anesthesia Evaluation  Patient identified by MRN, date of birth, ID band Patient awake    Reviewed: Allergy & Precautions, H&P , NPO status , Patient's Chart, lab work & pertinent test results  Airway Mallampati: III  TM Distance: >3 FB Neck ROM: Full    Dental  (+) Edentulous Upper, Missing   Pulmonary asthma , sleep apnea , former smoker   Pulmonary exam normal breath sounds clear to auscultation       Cardiovascular hypertension, + CAD  Normal cardiovascular exam Rhythm:Irregular Rate:Tachycardia  Ef 55-60%   Neuro/Psych  Headaches  Anxiety     CVA  negative psych ROS   GI/Hepatic Neg liver ROS,GERD  ,,  Endo/Other  diabetesHypothyroidism    Renal/GU Renal disease  negative genitourinary   Musculoskeletal  (+) Arthritis ,    Abdominal   Peds negative pediatric ROS (+)  Hematology  (+) Blood dyscrasia, anemia   Anesthesia Other Findings K 3.2  Reproductive/Obstetrics negative OB ROS                              Anesthesia Physical Anesthesia Plan  ASA: 4 and emergent  Anesthesia Plan: General   Post-op Pain Management:    Induction: Intravenous  PONV Risk Score and Plan:   Airway Management Planned: Nasal Cannula  Additional Equipment:   Intra-op Plan:   Post-operative Plan:   Informed Consent: I have reviewed the patients History and Physical, chart, labs and discussed the procedure including the risks, benefits and alternatives for the proposed anesthesia with the patient or authorized representative who has indicated his/her understanding and acceptance.     Dental advisory given  Plan Discussed with: CRNA  Anesthesia Plan Comments:          Anesthesia Quick Evaluation

## 2023-12-09 NOTE — Progress Notes (Signed)
   12/09/23 2105  BiPAP/CPAP/SIPAP  BiPAP/CPAP/SIPAP Pt Type Adult  BiPAP/CPAP/SIPAP Resmed  Mask Type Full face mask  Dentures removed? Not applicable  EPAP 13 cmH2O  FiO2 (%) 21 %  Patient Home Machine Yes  Safety Check Completed by RT for Home Unit Yes, no issues noted  Patient Home Mask Yes  Patient Home Tubing Yes  Auto Titrate No  Device Plugged into RED Power Outlet Yes   Patient assisted with and placed on home CPAP for the night.

## 2023-12-09 NOTE — Anesthesia Postprocedure Evaluation (Signed)
 Anesthesia Post Note  [name]  Procedure(s) Performed: COLONOSCOPY  Patient location during evaluation: PACU Anesthesia Type: General Level of consciousness: awake and alert Pain management: pain level controlled Vital Signs Assessment: post-procedure vital signs reviewed and stable Respiratory status: spontaneous breathing, nonlabored ventilation and respiratory function stable Cardiovascular status: blood pressure returned to baseline and stable Postop Assessment: no apparent nausea or vomiting Anesthetic complications: no   No notable events documented.   Last Vitals:  Vitals:   12/09/23 0945 12/09/23 1058  BP: (!) 95/57 118/79  Pulse:  78  Resp: (!) 21 18  Temp:  36.8 C  SpO2:  99%    Last Pain:  Vitals:   12/09/23 1058  TempSrc: Oral  PainSc:                  Andrea Limes

## 2023-12-10 ENCOUNTER — Ambulatory Visit: Admitting: Gastroenterology

## 2023-12-10 ENCOUNTER — Encounter (HOSPITAL_COMMUNITY)

## 2023-12-10 ENCOUNTER — Telehealth: Payer: Self-pay | Admitting: Gastroenterology

## 2023-12-10 DIAGNOSIS — K921 Melena: Secondary | ICD-10-CM | POA: Diagnosis not present

## 2023-12-10 LAB — CBC WITH DIFFERENTIAL/PLATELET
Abs Immature Granulocytes: 0.25 K/uL — ABNORMAL HIGH (ref 0.00–0.07)
Basophils Absolute: 0 K/uL (ref 0.0–0.1)
Basophils Relative: 0 %
Eosinophils Absolute: 0.1 K/uL (ref 0.0–0.5)
Eosinophils Relative: 3 %
HCT: 21.2 % — ABNORMAL LOW (ref 36.0–46.0)
Hemoglobin: 7.1 g/dL — ABNORMAL LOW (ref 12.0–15.0)
Immature Granulocytes: 8 %
Lymphocytes Relative: 12 %
Lymphs Abs: 0.4 K/uL — ABNORMAL LOW (ref 0.7–4.0)
MCH: 30.9 pg (ref 26.0–34.0)
MCHC: 33.5 g/dL (ref 30.0–36.0)
MCV: 92.2 fL (ref 80.0–100.0)
Monocytes Absolute: 0.3 K/uL (ref 0.1–1.0)
Monocytes Relative: 8 %
Neutro Abs: 2.3 K/uL (ref 1.7–7.7)
Neutrophils Relative %: 69 %
Platelets: 160 K/uL (ref 150–400)
RBC: 2.3 MIL/uL — ABNORMAL LOW (ref 3.87–5.11)
RDW: 14.2 % (ref 11.5–15.5)
Smear Review: NORMAL
WBC: 3.3 K/uL — ABNORMAL LOW (ref 4.0–10.5)
nRBC: 0 % (ref 0.0–0.2)

## 2023-12-10 LAB — HEMOGLOBIN AND HEMATOCRIT, BLOOD
HCT: 24.3 % — ABNORMAL LOW (ref 36.0–46.0)
Hemoglobin: 8.3 g/dL — ABNORMAL LOW (ref 12.0–15.0)

## 2023-12-10 LAB — COMPREHENSIVE METABOLIC PANEL WITH GFR
ALT: 22 U/L (ref 0–44)
AST: 27 U/L (ref 15–41)
Albumin: 3.7 g/dL (ref 3.5–5.0)
Alkaline Phosphatase: 66 U/L (ref 38–126)
Anion gap: 11 (ref 5–15)
BUN: 5 mg/dL — ABNORMAL LOW (ref 8–23)
CO2: 24 mmol/L (ref 22–32)
Calcium: 9.5 mg/dL (ref 8.9–10.3)
Chloride: 106 mmol/L (ref 98–111)
Creatinine, Ser: 1.09 mg/dL — ABNORMAL HIGH (ref 0.44–1.00)
GFR, Estimated: 57 mL/min — ABNORMAL LOW (ref >60)
Glucose, Bld: 88 mg/dL (ref 70–99)
Potassium: 3.4 mmol/L — ABNORMAL LOW (ref 3.5–5.1)
Sodium: 141 mmol/L (ref 135–145)
Total Bilirubin: 0.5 mg/dL (ref 0.0–1.2)
Total Protein: 5.7 g/dL — ABNORMAL LOW (ref 6.5–8.1)

## 2023-12-10 LAB — PREPARE RBC (CROSSMATCH)

## 2023-12-10 MED ORDER — FLUCONAZOLE 150 MG PO TABS
150.0000 mg | ORAL_TABLET | Freq: Once | ORAL | Status: AC
  Administered 2023-12-10: 150 mg via ORAL
  Filled 2023-12-10: qty 1

## 2023-12-10 MED ORDER — POTASSIUM CHLORIDE CRYS ER 20 MEQ PO TBCR
40.0000 meq | EXTENDED_RELEASE_TABLET | Freq: Once | ORAL | Status: AC
  Administered 2023-12-10: 40 meq via ORAL
  Filled 2023-12-10: qty 2

## 2023-12-10 MED ORDER — SODIUM CHLORIDE 0.9% IV SOLUTION: Freq: Once | INTRAVENOUS | Status: DC

## 2023-12-10 NOTE — Telephone Encounter (Signed)
 Patient needs hospital follow up with Therisa Stager, NP. In 2-3 weeks or so.

## 2023-12-10 NOTE — Progress Notes (Signed)
 Mobility Specialist Progress Note:    12/10/23 1028  Mobility  Activity Ambulated with assistance  Level of Assistance Modified independent, requires aide device or extra time  Assistive Device None  Distance Ambulated (ft) 15 ft  Range of Motion/Exercises Active;All extremities  Activity Response Tolerated well  Mobility Referral Yes  Mobility visit 1 Mobility  Mobility Specialist Start Time (ACUTE ONLY) 1028  Mobility Specialist Stop Time (ACUTE ONLY) 1040  Mobility Specialist Time Calculation (min) (ACUTE ONLY) 12 min   Pt received in bed, family in room. Requesting assistance to bathroom. ModI to stand and ambulate with no AD. Tolerated well, HR reached 140's during ambulation. Returned supine, all needs met.  Monigue Spraggins Mobility Specialist Please contact via Special Educational Needs Teacher or  Rehab office at [phone]

## 2023-12-10 NOTE — Discharge Summary (Signed)
 Physician Discharge Summary   [name] MRN: [id number] DOB: [birth year]  Admit date:     12/07/2023  Discharge date: 12/10/23  Discharge Physician: Bernardino KATHEE Come   PCP: [name], FNP   Recommendations at discharge:  Follow up with GI to be arranged at [facility].  Follow up with PCP in 1-2 weeks Follow up with oncology per routine.  Will need repeat CBC w/differential and metabolic profile at follow up.   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 Fortunately bleeding stopped. Colonoscopy revealed diverticulosis and internal hemorrhoids with no stigmata of bleeding. She's continued to have normal BM after reinitiation of anticoagulation and is cleared for discharge by the GI and medicine teams on 12/4.   Assessment and Plan: Hematochezia, diverticulosis: Possible (now resolved) diverticular bleed and/or bleeding from internal hemorrhoids.  - Still no further bleeding, benign abd exam, tolerating po without issues, cleared for discharge. D/w GI, Sonny Kerns, PA who will arrange follow up.  - Return precautions.   Leukopenia: Due to chemotherapy. No fever, treated with a single dose of granix on 12/3 per discussion with pt's oncologist, Dr. Sherrod. WBC up briskly.    ABLA on  anemia due to antineoplastic chemotherapy:  - Hgb equilibrating 7.7 > 7.5 > 7.1 slowly. She is unlikely to mount a sufficient erythropoietic response, so 1u RBCs given 12/4. Will need recheck at follow up.    Chronic AFib:  - Restarted DOAC. Had heparin  bridging despite some component of active GI bleeding since she's had CVA in the same setting previously when holding anticoagulation. - Continue metoprolol , diltiazem . Consider Watchman.    T2DM: No changes   Hypothyroidism: Recent TSH wnl.  - Continue synthroid .    Stage IIIa CKD:  - Avoid nephrotoxins   Chronic constipation:  - Daily fiber supplementation and prn laxatives per GI. Continuing bowel regimen at discharge.   Hypokalemia:  - Supplemented today, will continue standing supplement and suggest recheck at follow up.   Consultants: GI Procedures performed:  Colonoscopy 12/09/2023 Dr. Shaaron: Impression:        - Non-bleeding internal hemorrhoids.                           - Diverticulosis in the sigmoid colon, descending                            and distal transverse segments.                           - The examination was otherwise normal on direct  and retroflexion views.                           - No specimens collected. Today's findings are                            reassuring. I suspect there is a element of                            artifact called on the CT scan as far as colonic                            wall thickening is concerned. Patient absolutely                            reaffirms no abdominal pain whatsoever which makes                            an ischemic process much, much less likely.                           - May have had a diverticular bleed. May also bled                            from hemorrhoids with her history of straining.  Disposition: Home Diet recommendation:  As tolerated DISCHARGE MEDICATION: Allergies as of 12/10/2023       Reactions   Wound  Dressing Adhesive Other (See Comments)   Ripped skin    Apresoline  [hydralazine ] Nausea Only   Latex Other (See Comments)   Blisters/burns/welts    Other Other (See Comments)   Band aids discolor skin EKG pads irritate skin    Prednisone  Swelling   Patient states that she can take methylprednisolone  without complications She has tolerated PO dexamethasone  well. No complaints of swelling voiced from patient.        Medication List     STOP taking these medications    magnesium  oxide 400 (240 Mg) MG tablet Commonly known as: MAG-OX       TAKE these medications    acetaminophen  500 MG tablet Commonly known as: TYLENOL  Take 1,000 mg by mouth every 6 (six) hours as needed for mild pain (pain score 1-3).   albuterol  108 (90 Base) MCG/ACT inhaler Commonly known as: VENTOLIN  HFA Inhale 2 puffs into the lungs every 4 (four) hours as needed for wheezing or shortness of breath.   aspirin  EC 81 MG tablet Take 1 tablet (81 mg total) by mouth daily. Swallow whole.   Azelastine  HCl 137 MCG/SPRAY Soln Place 1 spray into both nostrils daily.   dexlansoprazole  60 MG capsule Commonly known as: DEXILANT  Take 1 capsule (60 mg total) by mouth daily.   EPINEPHrine  0.3 mg/0.3 mL Soaj injection Commonly known as: EPI-PEN Inject 0.3 mg into the muscle once as needed for anaphylaxis.   fluconazole  100 MG tablet Commonly known as: DIFLUCAN  Take 100 mg by mouth daily.   folic acid  1 MG tablet Commonly known as: FOLVITE  Take 1 tablet (1 mg total) by mouth daily.   lidocaine -prilocaine  cream Commonly known as: EMLA  APPLY TOPICALLY TO AFFECTED AREAS ONCE DAILY What  changed: See the new instructions.   linaclotide  72 MCG capsule Commonly known as: Linzess  Take 1 capsule (72 mcg total) by mouth daily before breakfast.   metoprolol  tartrate 25 MG tablet Commonly known as: LOPRESSOR  Take 0.5 tablets (12.5 mg total) by mouth 2 (two) times daily as needed. What changed: reasons to  take this   montelukast  10 MG tablet Commonly known as: SINGULAIR  Take 10 mg by mouth at bedtime.   NON FORMULARY Pt uses a cpap nightly   ondansetron  8 MG tablet Commonly known as: ZOFRAN  Take 1 tablet (8 mg total) by mouth every 8 (eight) hours as needed for nausea or vomiting.   polyethylene glycol 17 g packet Commonly known as: MiraLax  Take 17 g by mouth 2 (two) times daily.   potassium chloride  SA 20 MEQ tablet Commonly known as: KLOR-CON  M Take 1 tablet (20 mEq total) by mouth 2 (two) times daily.   prochlorperazine  10 MG tablet Commonly known as: COMPAZINE  Take 1 tablet (10 mg total) by mouth every 6 (six) hours as needed.   Restasis  0.05 % ophthalmic emulsion Generic drug: cycloSPORINE  Place 1 drop into both eyes daily as needed (dry eyes).   rivaroxaban  20 MG Tabs tablet Commonly known as: Xarelto  Take 1 tablet (20 mg total) by mouth daily.   rosuvastatin  20 MG tablet Commonly known as: CRESTOR  Take 1 tablet (20 mg total) by mouth daily.   Senexon-S 8.6-50 MG tablet Generic drug: senna-docusate Take 1 tablet by mouth 2 (two) times daily. What changed:  when to take this reasons to take this   Synthroid  25 MCG tablet Generic drug: levothyroxine  Take 1 tablet (25 mcg total) by mouth daily before breakfast.   Tiadylt  ER 360 MG 24 hr capsule Generic drug: diltiazem  TAKE ONE (1) CAPSULE BY MOUTH ONCE DAILY *NEW PRESCRIPTION REQUEST*        Follow-up Information     [name], FNP Follow up.   Specialty: Family Medicine Contact information: [address] [place] [address] [phone]         Cindie Carlin POUR, DO Follow up.   Specialty: Gastroenterology Contact information: [address] [place] [address] [phone]                Discharge Exam: [name]   12/07/23 0915 12/09/23 0826  Weight: 98.4 kg 98.4 kg  BP 110/62 (BP Location: Left Arm)   Pulse 92   Temp 98.1 F (36.7 C) (Oral)   Resp  19   Ht 5' 7 (1.702 m)   Wt 98.4 kg   SpO2 97%   BMI 33.98 kg/m   Well-appearing female in no distress Clear, nonlabored RRR No edema Soft, NT, ND  Condition at discharge: stable  The results of significant diagnostics from this hospitalization (including imaging, microbiology, ancillary and laboratory) are listed below for reference.   Imaging Studies: CT ANGIO GI BLEED Result Date: 12/07/2023 EXAM: CTA ABDOMEN AND PELVIS WITH CONTRAST 12/07/2023 12:04:43 PM TECHNIQUE: CTA images of the abdomen and pelvis with intravenous contrast. 100 mL (iohexol  (OMNIPAQUE ) 350 MG/ML injection 100 mL IOHEXOL  350 MG/ML SOLN). Three-dimensional MIP/volume rendered formations were performed. Automated exposure control, iterative reconstruction, and/or weight based adjustment of the mA/kV was utilized to reduce the radiation dose to as low as reasonably achievable. COMPARISON: 11/23/2023 and previous. CLINICAL HISTORY: Lower GI bleed. Right lung nonsmall cell carcinoma. FINDINGS: VASCULATURE: GI BLEED: No active extravasation of contrast within the GI tract. AORTA: No acute finding. No abdominal aortic aneurysm.  No dissection. CELIAC TRUNK: No acute finding. No occlusion or significant stenosis. SUPERIOR MESENTERIC ARTERY: No acute finding. No occlusion or significant stenosis. INFERIOR MESENTERIC ARTERY: No acute finding. No occlusion or significant stenosis. RENAL ARTERIES: Duplicated left renal arteries, inferior dominant, both patent. No acute finding. No occlusion or significant stenosis. ILIAC ARTERIES: Moderate aortoiliac calcified plaque without aneurysm. No acute finding. No occlusion or significant stenosis. ABDOMEN/PELVIS: LOWER CHEST: Visualized portion of the lower chest demonstrates no acute abnormality. LIVER: 3.2 cm poorly marginated low attenuation lesion in hepatic segment 4b adjacent to the gallbladder fossa. GALLBLADDER AND BILE DUCTS: Gallbladder is unremarkable. No biliary ductal dilatation.  SPLEEN: The spleen is unremarkable. PANCREAS: The pancreas is unremarkable. ADRENAL GLANDS: 1.6 cm left adrenal nodule stable since 09/07/23.   no acute abnormality. KIDNEYS, URETERS AND BLADDER: 2.5 cm right lower pole renal cortical cyst as before. No stones in the kidneys or ureters. No hydronephrosis. No perinephric or periureteral stranding. Urinary bladder is unremarkable. GI AND BOWEL: Stomach and duodenal sweep demonstrate no acute abnormality. Normal appendix. Some decrease in the inflammatory/edematous changes and wall thickening previously noted involving the proximal transverse colon. Scattered diverticula from the descending and sigmoid segments without adjacent inflammatory change. There is no bowel obstruction. No abnormal bowel wall thickening or distension. REPRODUCTIVE: Reproductive organs are unremarkable. PERITONEUM AND RETROPERITONEUM: No ascites or free air. LYMPH NODES: No lymphadenopathy. BONES AND SOFT TISSUES: Facet DJD in the lower lumbar spineL4-S1. No acute abnormality of the bones. No acute soft tissue abnormality. IMPRESSION: 1. No active GI bleeding. 2. 3.2 cm poorly marginated low attenuation lesion in hepatic segment 4b adjacent to the gallbladder fossa; recommend hepatic protocol MRI for characterization. 3. Some decrease in the previously described inflammatory and edematous changes with wall thickening involving the proximal transverse colon. Electronically signed by: Katheleen Faes MD 12/07/2023 01:43 PM EST RP Workstation: [id number]   CT CHEST ABDOMEN PELVIS WO CONTRAST Result Date: 11/26/2023 CLINICAL DATA:  Non-small cell lung cancer of the right lung EXAM: CT CHEST, ABDOMEN AND PELVIS WITHOUT CONTRAST TECHNIQUE: Multidetector CT imaging of the chest, abdomen and pelvis was performed following the standard protocol without IV contrast. RADIATION DOSE REDUCTION: This exam was performed according to the departmental dose-optimization program which includes automated exposure  control, adjustment of the mA and/or kV according to patient size and/or use of iterative reconstruction technique. COMPARISON:  CT abdomen pelvis November 17, 2023. CT chest abdomen pelvis September 18, 2023. FINDINGS: CT CHEST FINDINGS Cardiovascular: The heart size is normal. Atherosclerotic calcifications of coronary arteries. Left-sided porta catheter tip terminates in right atrium. No pericardial effusion. Mediastinum/Nodes: Few subcentimeter mediastinal lymph nodes. Limited evaluation of bilateral hilar lymph nodes given the lack contrast. Lungs/Pleura: Right upper lobe mass measuring 2.7 x 2 cm grossly stable to prior. 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 New patchy areas of ground-glass attenuation and fibrosis with associated more focal consolidation/irregular nodule is also identified in apical segment of right lower lobe measuring 2.4 x 1.5 cm (6/44) which may represent metastatic disease versus postradiation changes. Left lower lobe subpleural micronodule stable to prior (6/94). No pleural effusion. Musculoskeletal: Sclerotic changes with associated new nondisplaced fracture of right anterior sixth rib consistent with metastasis until proven otherwise. CT ABDOMEN PELVIS FINDINGS Hepatobiliary: No significant hepatic steatosis. Hypodense liver lesion in segment 4 measuring 3.4 x 3 cm, stable to prior and incompletely assessed  on current noncontrast CT. Gallbladder sludge/cholelithiasis. No pericholecystic fluid collection. Pancreas: Atrophic changes of the pancreas. No pancreatic ductal dilatation. Spleen: Normal spleen Adrenals/Urinary Tract: Left adrenal lipid rich adenoma measuring 1.4 cm, stable to prior she does not require imaging follow-up. Right adrenal is normal. Right kidney lower pole  cortical cyst which does not require imaging follow-up measuring 2.3 cm. No hydronephrosis. Stomach/Bowel: Colonic diverticulosis. There is a short-segment circumferential wall thickening spanning approximately 7.3 cm of the proximal transverse colon in right upper quadrant measuring 7.3 x 4 cm with pericolonic fat stranding 2/66). No bowel obstruction. Normal appendix. Small bowel loops are normal. Vascular/Lymphatic: Aortic atherosclerosis. No enlarged abdominal or pelvic lymph nodes. Reproductive: Uterine fibroids with coarse calcifications. Normal bilateral adnexa. Other: Tiny fat containing umbilical hernia. Musculoskeletal: See above.  No other osseous lesion identified. IMPRESSION: Stable appearance of primary known perifissural right upper lobe malignancy. No suspicious new mediastinal or supraclavicular lymphadenopathy. Interval increase in peritumoral right upper and superior segment right lower lobe consolidation/fibrotic changes with traction bronchiectasis which may represent evolving postradiation/posttreatment changes versus progressive disease. Follow-up PET-CT is suggested for further differentiation in 6-8 weeks. Persistent sclerotic changes of right anterior sixth rib with a new nondisplaced fracture (metastasis versus post radiation osteitis). Persistent and grossly stable circumferential short-segment wall thickening with associated pericolonic fat stranding involving the proximal transverse colon may represent focal colitis/diverticulitis versus underlying malignancy. Recommend correlation with clinical findings and colonoscopy. Again seen there is a hypodense liver lesion in segment 4, similar to prior, incompletely assessed on current noncontrast CT. Metastasis can not be excluded. This can be further assessed at the time of PET-CT. Gallbladder sludge/cholelithiasis. Left adrenal nodule stable to prior likely benign/treated disease. Electronically Signed   By: Megan  Zare M.D.   On:  11/26/2023 16:51   CT ABDOMEN PELVIS WO CONTRAST Result Date: 11/17/2023 EXAM: CT ABDOMEN AND PELVIS WITHOUT CONTRAST 11/17/2023 07:45:00 PM TECHNIQUE: CT of the abdomen and pelvis was performed without the administration of intravenous contrast. Multiplanar reformatted images are provided for review. Automated exposure control, iterative reconstruction, and/or weight-based adjustment of the mA/kV was utilized to reduce the radiation dose to as low as reasonably achievable. COMPARISON: 09/18/2023 CLINICAL HISTORY: Bowel obstruction suspected. FINDINGS: LOWER CHEST: No acute abnormality. LIVER: 3.5 cm low density lesion inferiorly along the gallbladder fossa (image 20), possibly reflecting focal fat, but new from recent prior, metastasis not excluded. GALLBLADDER AND BILE DUCTS: Layering tiny gallstones, without associated inflammatory changes. No biliary ductal dilatation. SPLEEN: No acute abnormality. PANCREAS: No acute abnormality. ADRENAL GLANDS: 16 mm left adrenal nodule (image 24) unchanged from multiple priors, likely reflecting a benign adrenal adenoma. KIDNEYS, URETERS AND BLADDER: No stones in the kidneys or ureters. No hydronephrosis. No perinephric or periureteral stranding. Urinary bladder is unremarkable. GI AND BOWEL: Stomach demonstrates no acute abnormality. Short segment wall thickening with pericolonic inflammatory changes along the proximal transverse colon (image 36), suggesting infectious/inflammatory colitis. Normal appendix (image 55). Sigmoid diverticulosis, without evidence of diverticulitis. There is no bowel obstruction. PERITONEUM AND RETROPERITONEUM: No ascites. No free air. VASCULATURE: Aorta is normal in caliber. Atherosclerotic calcifications of the abdominal aorta and branch vessels. LYMPH NODES: No lymphadenopathy. REPRODUCTIVE ORGANS: The uterus is within normal limits. BONES AND SOFT TISSUES: Mild degenerative changes of the lower thoracic spine. No acute osseous abnormality.  No focal soft tissue abnormality. IMPRESSION: 1. Suspected infectious/inflammatory colitis involving the proximal transverse colon. 2. New 3.5 cm low-density lesion along the gallbladder fossa, possibly reflecting focal fat, although metastasis is not  excluded. Consider follow-up MR abdomen with/without contrast in 4-6 weeks. Electronically signed by: Pinkie Pebbles MD 11/17/2023 07:52 PM EST RP Workstation: [id number]   DG Chest Port 1 View Result Date: 11/17/2023 CLINICAL DATA:  Cough. EXAM: PORTABLE CHEST 1 VIEW COMPARISON:  Chest radiograph dated 10/15/2023. FINDINGS: Port-A-Cath in similar position. Right suprahilar streaky density progressed since the prior radiograph. No new consolidation. No pleural effusion pneumothorax. Stable cardiac silhouette no acute osseous pathology. IMPRESSION: No acute cardiopulmonary process. Electronically Signed   By: Vanetta Chou M.D.   On: 11/17/2023 17:50    Microbiology: Results for orders placed or performed during the hospital encounter of 11/17/23  Resp panel by RT-PCR (RSV, Flu A&B, Covid) Anterior Nasal Swab     Status: None   Collection Time: 11/17/23  6:12 PM   Specimen: Anterior Nasal Swab  Result Value Ref Range Status   SARS Coronavirus 2 by RT PCR NEGATIVE NEGATIVE Final    Comment: (NOTE) SARS-CoV-2 target nucleic acids are NOT DETECTED.  The SARS-CoV-2 RNA is generally detectable in upper respiratory specimens during the acute phase of infection. The lowest concentration of SARS-CoV-2 viral copies this assay can detect is 138 copies/mL. A negative result does not preclude SARS-Cov-2 infection and should not be used as the sole basis for treatment or other patient management decisions. A negative result may occur with  improper specimen collection/handling, submission of specimen other than nasopharyngeal swab, presence of viral mutation(s) within the areas targeted by this assay, and inadequate number of viral copies(<138  copies/mL). A negative result must be combined with clinical observations, patient history, and epidemiological information. The expected result is Negative.  Fact Sheet for Patients:  bloggercourse.com  Fact Sheet for Healthcare Providers:  seriousbroker.it  This test is no t yet approved or cleared by the [place]  FDA and  has been authorized for detection and/or diagnosis of SARS-CoV-2 by FDA under an Emergency Use Authorization (EUA). This EUA will remain  in effect (meaning this test can be used) for the duration of the COVID-19 declaration under Section 564(b)(1) of the Act, 21 U.S.C.section 360bbb-3(b)(1), unless the authorization is terminated  or revoked sooner.       Influenza A by PCR NEGATIVE NEGATIVE Final   Influenza B by PCR NEGATIVE NEGATIVE Final    Comment: (NOTE) The Xpert Xpress SARS-CoV-2/FLU/RSV plus assay is intended as an aid in the diagnosis of influenza from Nasopharyngeal swab specimens and should not be used as a sole basis for treatment. Nasal washings and aspirates are unacceptable for Xpert Xpress SARS-CoV-2/FLU/RSV testing.  Fact Sheet for Patients: bloggercourse.com  Fact Sheet for Healthcare Providers: seriousbroker.it  This test is not yet approved or cleared by the [place]  FDA and has been authorized for detection and/or diagnosis of SARS-CoV-2 by FDA under an Emergency Use Authorization (EUA). This EUA will remain in effect (meaning this test can be used) for the duration of the COVID-19 declaration under Section 564(b)(1) of the Act, 21 U.S.C. section 360bbb-3(b)(1), unless the authorization is terminated or revoked.     Resp Syncytial Virus by PCR NEGATIVE NEGATIVE Final    Comment: (NOTE) Fact Sheet for Patients: bloggercourse.com  Fact Sheet for Healthcare  Providers: seriousbroker.it  This test is not yet approved or cleared by the [place]  FDA and has been authorized for detection and/or diagnosis of SARS-CoV-2 by FDA under an Emergency Use Authorization (EUA). This EUA will remain in effect (meaning this test can be used) for the duration of the COVID-19 declaration  under Section 564(b)(1) of the Act, 21 U.S.C. section 360bbb-3(b)(1), unless the authorization is terminated or revoked.  Performed at [facility], [address] [place], [place] [address]    *Note: Due to a large number of results and/or encounters for the requested time period, some results have not been displayed. A complete set of results can be found in Results Review.    Labs: CBC: Recent Labs  Lab 12/07/23 0952 12/08/23 0445 12/09/23 0437 12/10/23 0432  WBC 2.5* 1.3* 1.3* 3.3*  NEUTROABS 2.0  --   --  2.3  HGB 8.3* 7.7* 7.5* 7.1*  HCT 24.9* 22.8* 22.2* 21.2*  MCV 93.3 92.3 91.4 92.2  PLT 325 243 206 160   Basic Metabolic Panel: Recent Labs  Lab 12/07/23 0952 12/08/23 0445 12/09/23 0437 12/10/23 0432  NA 139 138 142 141  K 3.8 3.3* 3.2* 3.4*  CL 104 103 107 106  CO2 22 23 21* 24  GLUCOSE 107* 96 91 88  BUN 9 6* 6* 5*  CREATININE 1.20* 1.11* 1.06* 1.09*  CALCIUM  10.0 9.4 9.2 9.5  MG  --  1.5* 1.7  --    Liver Function Tests: Recent Labs  Lab 12/07/23 0952 12/10/23 0432  AST 35 27  ALT 25 22  ALKPHOS 71 66  BILITOT 0.6 0.5  PROT 6.6 5.7*  ALBUMIN 4.1 3.7   CBG: Recent Labs  Lab 12/09/23 0824  GLUCAP 99    Discharge time spent: greater than 30 minutes.  Signed: Bernardino KATHEE Come, MD [facility] 12/10/2023

## 2023-12-11 ENCOUNTER — Telehealth: Payer: Self-pay | Admitting: Internal Medicine

## 2023-12-11 ENCOUNTER — Encounter (HOSPITAL_COMMUNITY): Payer: Self-pay | Admitting: Internal Medicine

## 2023-12-11 ENCOUNTER — Telehealth: Payer: Self-pay | Admitting: *Deleted

## 2023-12-11 LAB — TYPE AND SCREEN
ABO/RH(D): O POS
Antibody Screen: NEGATIVE
Unit division: 0

## 2023-12-11 LAB — BPAM RBC
Blood Product Expiration Date: 202512282359
ISSUE DATE / TIME: 202512041126
Unit Type and Rh: 5100

## 2023-12-11 NOTE — Transitions of Care (Post Inpatient/ED Visit) (Signed)
 12/11/2023  Name: Becky [name] MRN: [id number] DOB: 13-Mar-1960  Today's TOC FU Call Status: Today's TOC FU Call Status:: Successful TOC FU Call Completed TOC FU Call Complete Date: 12/11/23  Patient's Name and Date of Birth confirmed. Name, DOB  Transition Care Management Follow-up Telephone Call Date of Discharge: 12/10/23 Discharge Facility: [facility]) Type of Discharge: Inpatient Admission Primary Inpatient Discharge Diagnosis:: hematochezia How have you been since you were released from the hospital?: Better (appetite good, ambulating with cane, walker, has scant amount blood with bowel movements, has internal hemorrhoids) Any questions or concerns?: No  Items Reviewed: Did you receive and understand the discharge instructions provided?: Yes Any new allergies since your discharge?: No Dietary orders reviewed?: Yes Type of Diet Ordered:: heart healthy   low sodium Do you have support at home?: Yes People in Home [RPT]: child(ren), adult Name of Support/Comfort Primary Source: Luke, daughter  Medications Reviewed Today: Medications Reviewed Today     Reviewed by Aura Mliss LABOR, RN (Registered Nurse) on 12/11/23 at 1213  Med List Status: <None>   Medication Order Taking? Sig Documenting Provider Last Dose Status Informant  acetaminophen  (TYLENOL ) 500 MG tablet [id number] Yes Take 1,000 mg by mouth every 6 (six) hours as needed for mild pain (pain score 1-3). [provider]  Active Pharmacy Records, Self, Child, Multiple Informants  albuterol  (VENTOLIN  HFA) 108 (90 Base) MCG/ACT inhaler [id number] Yes Inhale 2 puffs into the lungs every 4 (four) hours as needed for wheezing or shortness of breath. Stuart Vernell Norris, PA-C  Active Pharmacy Records, Self, Child, Multiple Informants  aspirin  EC 81 MG tablet [id number] Yes Take 1 tablet (81 mg total) by mouth daily. Swallow whole. Ricky Fines, MD  Active Pharmacy Records, Self, Child, Multiple Informants   Azelastine  HCl 137 MCG/SPRAY SOLN [id number] Yes Place 1 spray into both nostrils daily. [provider]  Active Self, Child, Pharmacy Records, Multiple Informants  dexlansoprazole  (DEXILANT ) 60 MG capsule [id number] Yes Take 1 capsule (60 mg total) by mouth daily. Cindie Carlin POUR, DO  Active Self, Child, Pharmacy Records, Multiple Informants  diltiazem  (TIADYLT  ER) 360 MG 24 hr capsule [id number] Yes TAKE ONE (1) CAPSULE BY MOUTH ONCE DAILY *NEW PRESCRIPTION REQUEST* Branch, Dorn FALCON, MD  Active Pharmacy Records, Self, Child, Multiple Informants  EPINEPHrine  0.3 mg/0.3 mL IJ SOAJ injection [id number]  Inject 0.3 mg into the muscle once as needed for anaphylaxis. [provider]  Active Pharmacy Records, Self, Child, Multiple Informants           Med Note LESLY, RICHERD CINDERELLA Debar Nov 24, 2023  2:05 PM) Check for expiration date  fluconazole  (DIFLUCAN ) 100 MG tablet [id number] Yes Take 100 mg by mouth daily. [provider]  Active Self, Child, Pharmacy Records, Multiple Informants  folic acid  (FOLVITE ) 1 MG tablet [id number] Yes Take 1 tablet (1 mg total) by mouth daily. Davonna Siad, MD  Active Pharmacy Records, Self, Child, Multiple Informants  lidocaine -prilocaine  (EMLA ) cream [id number] Yes APPLY TOPICALLY TO AFFECTED AREAS ONCE DAILY Sherrod Sherrod, MD  Active Self, Pharmacy Records, Child, Multiple Informants  linaclotide  (LINZESS ) 72 MCG capsule [id number] Yes Take 1 capsule (72 mcg total) by mouth daily before breakfast. Shirlean Therisa ORN, NP  Active Pharmacy Records, Self, Child, Multiple Informants           Med Note LESLY, RICHERD CINDERELLA Debar Nov 24, 2023  2:03 PM) Only as needed  metoprolol  tartrate (LOPRESSOR ) 25 MG tablet [id number] Yes Take 0.5 tablets (  12.5 mg total) by mouth 2 (two) times daily as needed.  Patient taking differently: Take 12.5 mg by mouth 2 (two) times daily as needed (heart rate). PRN, depending on heart rate, pt states per cardiologist    Miriam Norris, NP  Active Self, Child, Pharmacy Records, Multiple Informants  montelukast  (SINGULAIR ) 10 MG tablet [id number] Yes Take 10 mg by mouth at bedtime. [provider]  Active Pharmacy Records, Self, Child, Multiple Informants  NON FORMULARY [id number]  Pt uses a cpap nightly [provider]  Active Pharmacy Records, Self, Child, Multiple Informants  ondansetron  (ZOFRAN ) 8 MG tablet [id number] Yes Take 1 tablet (8 mg total) by mouth every 8 (eight) hours as needed for nausea or vomiting. Lue Elsie BROCKS, MD  Active Self, Child, Pharmacy Records, Multiple Informants  polyethylene glycol (MIRALAX ) 17 g packet [id number] Yes Take 17 g by mouth 2 (two) times daily. Lue Elsie BROCKS, MD  Active Self, Child, Pharmacy Records, Multiple Informants           Med Note LESLY, RICHERD CINDERELLA Debar Nov 24, 2023  2:01 PM) Need to pick up  potassium chloride  SA (KLOR-CON  M) 20 MEQ tablet [id number] Yes Take 1 tablet (20 mEq total) by mouth 2 (two) times daily. Heilingoetter, Cassandra L, PA-C  Active Self, Child, Pharmacy Records, Multiple Informants  prochlorperazine  (COMPAZINE ) 10 MG tablet [id number] Yes Take 1 tablet (10 mg total) by mouth every 6 (six) hours as needed. Heilingoetter, Cassandra L, PA-C  Active Self, Child, Pharmacy Records, Multiple Informants  RESTASIS  0.05 % ophthalmic emulsion [id number] Yes Place 1 drop into both eyes daily as needed (dry eyes). [provider]  Active Pharmacy Records, Self, Child, Multiple Informants  rivaroxaban  (XARELTO ) 20 MG TABS tablet [id number] Yes Take 1 tablet (20 mg total) by mouth daily. Alvan Dorn FALCON, MD  Active Pharmacy Records, Self, Child, Multiple Informants  rosuvastatin  (CRESTOR ) 20 MG tablet [id number] Yes Take 1 tablet (20 mg total) by mouth daily. [name], FNP  Active Self, Child, Pharmacy Records, Multiple Informants  SENEXON-S 8.6-50 MG tablet [id number] Yes Take 1 tablet by mouth 2 (two) times  daily.  Patient taking differently: Take 1 tablet by mouth as needed for moderate constipation. As needed, moderate constipation   Lue Elsie BROCKS, MD  Active Self, Child, Pharmacy Records, Multiple Informants  SYNTHROID  25 MCG tablet [id number] Yes Take 1 tablet (25 mcg total) by mouth daily before breakfast. [name], FNP  Active Self, Child, Pharmacy Records, Multiple Informants            Home Care and Equipment/Supplies: Were Home Health Services Ordered?: No Any new equipment or medical supplies ordered?: No  Functional Questionnaire: Do you need assistance with bathing/showering or dressing?: No Do you need assistance with meal preparation?: Yes (daughter assists) Do you need assistance with eating?: No Do you have difficulty maintaining continence: No Do you need assistance with getting out of bed/getting out of a chair/moving?: Yes (walker, cane) Do you have difficulty managing or taking your medications?: No  Follow up appointments reviewed: PCP Follow-up appointment confirmed?: Yes (collaborated with care guide, scheduled appointment) Date of PCP follow-up appointment?: 12/21/23 Follow-up Provider: Ronal Rollene Gladis FNP  @ 11 am Specialist Hospital Follow-up appointment confirmed?: No Reason Specialist Follow-Up Not Confirmed: Patient has Specialist Provider Number and will Call for Appointment (pt has contact # GI  Dr. Shaaron, will call today and schedule appointment) Do you need transportation to your follow-up appointment?: No Do you understand care  options if your condition(s) worsen?: Yes-patient verbalized understanding  SDOH Interventions Today    Flowsheet Row Most Recent Value  SDOH Interventions   Food Insecurity Interventions Intervention Not Indicated  Housing Interventions Intervention Not Indicated  Transportation Interventions Intervention Not Indicated  Utilities Interventions Intervention Not Indicated    Goals Addressed              This Visit's Progress    [facility] Transitions of Care (TOC) Care Plan       Problems:  Recent Hospitalization for treatment of Hematochezia - Patient is per DC Summary - is a 63 y.o. 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, atrial fibrillation, hypertension, Diabetes, internal hemorrhoids Pt reports she notices scant blood at times with bowel movements, pt states she is not straining with bowel movements, pt is calling today to schedule GI post hospital follow up  Goal:  Over the next 30 days, the patient will not experience hospital readmission  Interventions:  Transitions of Care: 11/24/23 Doctor Visits  - discussed the importance of doctor visits  Reviewed discharge instructions with pt Call MD for: difficulty breathing, headache or visual disturbances Call MD for: extreme fatigue Call MD for: blood in stool Call MD for: persistant dizziness or light-headedness Call MD for: persistant nausea and vomiting Call MD for: severe uncontrolled pain Call MD for: temperature >100.4 Go to ED for chest pain, unrelieved dyspnea, fainting, increased blood in stool Reviewed avoiding sick persons, washing hands well, wearing a mask as needed Reviewed diet - low sodium, heart healthy, carbohydrate modified Increase activity slowly Reviewed importance of not straining with bowel movements, using stool softeners as needed Reviewed safety precautions SDOH screenings completed  Patient Self Care Activities:  Attend all scheduled provider appointments Call pharmacy for medication refills 3-7 days in advance of running out of medications Call provider office for new concerns or questions  Notify RN Care Manager of TOC call rescheduling needs Participate in Transition of Care Program/Attend TOC scheduled calls Take medications as prescribed    Plan:  The care management team will reach out to the patient again over the next 5 -10 business days. The  patient has been provided with contact information for the care management team and has been advised to call with any health related questions or concerns.  Patient is scheduled for TOC follow up on 12/18/23 @ 115 pm          [name] [facility], BSN RN Care Manager/ Transition of Care [place]/ [facility] [phone]

## 2023-12-11 NOTE — Telephone Encounter (Signed)
 Maybe consider 12/24 or 12/30 if givens does not get scheduled those should open up.

## 2023-12-11 NOTE — Telephone Encounter (Signed)
 Patient daughter called in office and mention patient was bleeding. Patient did have a colonoscopy procedure in hospital recently. Forwarded call to nurse and she spoke to patient about this matter

## 2023-12-14 ENCOUNTER — Telehealth: Payer: Self-pay

## 2023-12-14 ENCOUNTER — Other Ambulatory Visit: Payer: Self-pay

## 2023-12-14 ENCOUNTER — Encounter: Payer: Self-pay | Admitting: Internal Medicine

## 2023-12-14 ENCOUNTER — Ambulatory Visit (HOSPITAL_COMMUNITY)

## 2023-12-14 DIAGNOSIS — E860 Dehydration: Secondary | ICD-10-CM

## 2023-12-14 DIAGNOSIS — R110 Nausea: Secondary | ICD-10-CM

## 2023-12-14 MED ORDER — ONDANSETRON HCL 4 MG/2ML IJ SOLN: 8.0000 mg | Freq: Once | INTRAMUSCULAR | Status: DC

## 2023-12-14 MED ORDER — SODIUM CHLORIDE 0.9 % IV SOLN: Freq: Once | INTRAVENOUS | Status: DC

## 2023-12-14 NOTE — Addendum Note (Signed)
 Addended by: TAMELA RAISIN R on: 12/14/2023 04:04 PM   Modules accepted: Orders

## 2023-12-14 NOTE — Telephone Encounter (Signed)
 I spoke with the pt's daughter and advised her to take the pt to the ER if the patient was having significant bleeding.   Ladonna: please contact the pt to arrange a hospital f/u

## 2023-12-14 NOTE — Telephone Encounter (Signed)
 Pt called regarding weakness she has been having since discharge from the hospital.  Pt states she is taking zofran  and compazine .  No diarrhea since leaving hospital.  Has eaten breakfast today.  Discussed with Dr Sherrod who advised pt will get IV fluids tomorrow.

## 2023-12-15 ENCOUNTER — Ambulatory Visit (HOSPITAL_COMMUNITY): Admitting: Physical Therapy

## 2023-12-15 ENCOUNTER — Inpatient Hospital Stay: Attending: Internal Medicine

## 2023-12-15 DIAGNOSIS — D693 Immune thrombocytopenic purpura: Secondary | ICD-10-CM | POA: Diagnosis not present

## 2023-12-15 DIAGNOSIS — C7951 Secondary malignant neoplasm of bone: Secondary | ICD-10-CM | POA: Diagnosis not present

## 2023-12-15 DIAGNOSIS — Z5112 Encounter for antineoplastic immunotherapy: Secondary | ICD-10-CM | POA: Insufficient documentation

## 2023-12-15 DIAGNOSIS — D6481 Anemia due to antineoplastic chemotherapy: Secondary | ICD-10-CM | POA: Diagnosis not present

## 2023-12-15 DIAGNOSIS — K648 Other hemorrhoids: Secondary | ICD-10-CM | POA: Diagnosis not present

## 2023-12-15 DIAGNOSIS — K5900 Constipation, unspecified: Secondary | ICD-10-CM | POA: Insufficient documentation

## 2023-12-15 DIAGNOSIS — C7931 Secondary malignant neoplasm of brain: Secondary | ICD-10-CM | POA: Diagnosis not present

## 2023-12-15 DIAGNOSIS — C7972 Secondary malignant neoplasm of left adrenal gland: Secondary | ICD-10-CM | POA: Insufficient documentation

## 2023-12-15 DIAGNOSIS — R110 Nausea: Secondary | ICD-10-CM

## 2023-12-15 DIAGNOSIS — Z5111 Encounter for antineoplastic chemotherapy: Secondary | ICD-10-CM | POA: Diagnosis present

## 2023-12-15 DIAGNOSIS — C3411 Malignant neoplasm of upper lobe, right bronchus or lung: Secondary | ICD-10-CM | POA: Insufficient documentation

## 2023-12-15 DIAGNOSIS — E860 Dehydration: Secondary | ICD-10-CM

## 2023-12-15 DIAGNOSIS — R634 Abnormal weight loss: Secondary | ICD-10-CM | POA: Insufficient documentation

## 2023-12-15 MED ORDER — ONDANSETRON HCL 4 MG/2ML IJ SOLN
8.0000 mg | Freq: Once | INTRAMUSCULAR | Status: AC
  Administered 2023-12-15: 8 mg via INTRAVENOUS
  Filled 2023-12-15: qty 4

## 2023-12-15 MED ORDER — SODIUM CHLORIDE 0.9 % IV SOLN: Freq: Once | INTRAVENOUS | Status: AC

## 2023-12-15 NOTE — Patient Instructions (Signed)
Nausea, Adult Nausea is feeling like you may vomit. Feeling like you may vomit is usually not serious, but it may be an early sign of a more serious medical problem. Vomiting is when stomach contents forcefully come out of your mouth. If you vomit, or if you are not able to drink enough fluids, you may not have enough water in your body (get dehydrated). If you do not have enough water in your body, you may: Feel tired. Feel thirsty. Have a dry mouth. Have cracked lips. Pee (urinate) less often. Older adults and people who have other diseases or a weak body defense system (immune system) have a higher risk of not having enough water in the body. The main goals of treating this condition are: To relieve your nausea. To ensure your nausea occurs less often. To prevent vomiting and losing too much fluid. Follow these instructions at home: Watch your symptoms for any changes. Tell your doctor about them. Eating and drinking     Take an ORS (oral rehydration solution). This is a drink that is sold at pharmacies and stores. Drink clear fluids in small amounts as you are able. These include: Water. Ice chips. Fruit juice that has water added (diluted fruit juice). Low-calorie sports drinks. Eat bland, easy-to-digest foods in small amounts as you are able, such as: Bananas. Applesauce. Rice. Low-fat (lean) meats. Toast. Crackers. Avoid drinking fluids that have a lot of sugar or caffeine in them. This includes energy drinks, sports drinks, and soda. Avoid alcohol. Avoid spicy or fatty foods. General instructions Take over-the-counter and prescription medicines only as told by your doctor. Rest at home while you get better. Drink enough fluid to keep your pee (urine) pale yellow. Take slow and deep breaths when you feel like you may vomit. Avoid food or things that have strong smells. Wash your hands often with soap and water for at least 20 seconds. If you cannot use soap and water,  use hand sanitizer. Make sure that everyone in your home washes their hands well and often. Keep all follow-up visits. Contact a doctor if: You feel worse. You feel like you may vomit and this lasts for more than 2 days. You vomit. You are not able to drink fluids without vomiting. You have new symptoms. You have a fever. You have a headache. You have muscle cramps. You have a rash. You have pain while peeing. You feel light-headed or dizzy. Get help right away if: You have pain in your chest, neck, arm, or jaw. You feel very weak or you faint. You have vomit that is bright red or looks like coffee grounds. You have bloody or black poop (stools) or poop that looks like tar. You have a very bad headache, a stiff neck, or both. You have very bad pain, cramping, or bloating in your belly (abdomen). You have trouble breathing or you are breathing very quickly. Your heart is beating very quickly. Your skin feels cold and clammy. You feel confused. You have signs of losing too much water in your body, such as: Dark pee, very little pee, or no pee. Cracked lips. Dry mouth. Sunken eyes. Sleepiness. Weakness. These symptoms may be an emergency. Get help right away. Call 911. Do not wait to see if the symptoms will go away. Do not drive yourself to the hospital. Summary Nausea is feeling like you are about vomit. If you vomit, or if you are not able to drink enough fluids, you may not have enough water in   your body (get dehydrated). Eat and drink what your doctor tells you. Take over-the-counter and prescription medicines only as told by your doctor. Contact a doctor right away if your symptoms get worse or you have new symptoms. Keep all follow-up visits. This information is not intended to replace advice given to you by your health care provider. Make sure you discuss any questions you have with your health care provider. Document Revised: 06/29/2020 Document Reviewed:  06/29/2020 Elsevier Patient Education  2024 [employer]  

## 2023-12-15 NOTE — Telephone Encounter (Signed)
 Phoned and LMOVm for the pt to go to the ED if she is still bleeding because there was a message on my phone regarding this

## 2023-12-15 NOTE — Telephone Encounter (Signed)
 Noted. We are trying to get her in for hospital follow-up. Needs to be in the next 2 weeks. Ladonna, if I don't have anything, we can look at other APPs as others saw her while inpatient as well. Preferably with someone who saw her inpatient.

## 2023-12-15 NOTE — Telephone Encounter (Signed)
FYI.  Message below.

## 2023-12-16 ENCOUNTER — Ambulatory Visit (HOSPITAL_COMMUNITY): Attending: Internal Medicine

## 2023-12-16 ENCOUNTER — Ambulatory Visit: Admitting: Physician Assistant

## 2023-12-16 DIAGNOSIS — D649 Anemia, unspecified: Secondary | ICD-10-CM

## 2023-12-17 ENCOUNTER — Emergency Department (HOSPITAL_COMMUNITY)
Admission: EM | Admit: 2023-12-17 | Discharge: 2023-12-17 | Disposition: A | Discharge status: Home / Self Care | Source: Ambulatory Visit

## 2023-12-17 ENCOUNTER — Other Ambulatory Visit: Payer: Self-pay

## 2023-12-17 ENCOUNTER — Encounter (HOSPITAL_COMMUNITY): Payer: Self-pay

## 2023-12-17 DIAGNOSIS — K5900 Constipation, unspecified: Secondary | ICD-10-CM | POA: Diagnosis not present

## 2023-12-17 DIAGNOSIS — K625 Hemorrhage of anus and rectum: Secondary | ICD-10-CM | POA: Diagnosis present

## 2023-12-17 DIAGNOSIS — Z7982 Long term (current) use of aspirin: Secondary | ICD-10-CM | POA: Insufficient documentation

## 2023-12-17 DIAGNOSIS — Z91040 Latex allergy status: Secondary | ICD-10-CM | POA: Insufficient documentation

## 2023-12-17 DIAGNOSIS — K649 Unspecified hemorrhoids: Secondary | ICD-10-CM

## 2023-12-17 DIAGNOSIS — D649 Anemia, unspecified: Secondary | ICD-10-CM | POA: Insufficient documentation

## 2023-12-17 LAB — CBC WITH DIFFERENTIAL/PLATELET
Abs Immature Granulocytes: 0.14 K/uL — ABNORMAL HIGH (ref 0.00–0.07)
Basophils Absolute: 0 K/uL (ref 0.0–0.1)
Basophils Relative: 1 %
Eosinophils Absolute: 0.2 K/uL (ref 0.0–0.5)
Eosinophils Relative: 6 %
HCT: 25.9 % — ABNORMAL LOW (ref 36.0–46.0)
Hemoglobin: 8.7 g/dL — ABNORMAL LOW (ref 12.0–15.0)
Immature Granulocytes: 4 %
Lymphocytes Relative: 18 %
Lymphs Abs: 0.7 K/uL (ref 0.7–4.0)
MCH: 30.9 pg (ref 26.0–34.0)
MCHC: 33.6 g/dL (ref 30.0–36.0)
MCV: 91.8 fL (ref 80.0–100.0)
Monocytes Absolute: 0.9 K/uL (ref 0.1–1.0)
Monocytes Relative: 23 %
Neutro Abs: 1.9 K/uL (ref 1.7–7.7)
Neutrophils Relative %: 48 %
Platelets: 118 K/uL — ABNORMAL LOW (ref 150–400)
RBC: 2.82 MIL/uL — ABNORMAL LOW (ref 3.87–5.11)
RDW: 14.1 % (ref 11.5–15.5)
WBC: 3.8 K/uL — ABNORMAL LOW (ref 4.0–10.5)
nRBC: 0 % (ref 0.0–0.2)

## 2023-12-17 LAB — TYPE AND SCREEN
ABO/RH(D): O POS
Antibody Screen: NEGATIVE

## 2023-12-17 LAB — COMPREHENSIVE METABOLIC PANEL WITH GFR
ALT: 25 U/L (ref 0–44)
AST: 28 U/L (ref 15–41)
Albumin: 4.1 g/dL (ref 3.5–5.0)
Alkaline Phosphatase: 71 U/L (ref 38–126)
Anion gap: 15 (ref 5–15)
BUN: <5 mg/dL — ABNORMAL LOW (ref 8–23)
CO2: 20 mmol/L — ABNORMAL LOW (ref 22–32)
Calcium: 9.9 mg/dL (ref 8.9–10.3)
Chloride: 107 mmol/L (ref 98–111)
Creatinine, Ser: 1.24 mg/dL — ABNORMAL HIGH (ref 0.44–1.00)
GFR, Estimated: 49 mL/min — ABNORMAL LOW (ref >60)
Glucose, Bld: 103 mg/dL — ABNORMAL HIGH (ref 70–99)
Potassium: 4 mmol/L (ref 3.5–5.1)
Sodium: 141 mmol/L (ref 135–145)
Total Bilirubin: 0.3 mg/dL (ref 0.0–1.2)
Total Protein: 6.4 g/dL — ABNORMAL LOW (ref 6.5–8.1)

## 2023-12-17 MED ORDER — MAGNESIUM CITRATE PO SOLN: 1.0000 | Freq: Once | ORAL | 0 refills | Status: AC

## 2023-12-17 NOTE — Telephone Encounter (Signed)
 noted

## 2023-12-17 NOTE — Telephone Encounter (Signed)
 Phoned pt back regarding her bleeding. She states it has been awhile and on 12/05 Becky [name] wanted to get the pt in here for a visit because of the bleeding. Pt was advised to go to the ED for evaluation as before but she states it is just a little but pt really does not want to go to the ED. Please advise

## 2023-12-17 NOTE — ED Triage Notes (Signed)
 Pt arrived via POV for recurrent nausea, rectal bleeding and fatigue since recent hospital admission. Pt reports she sees blood in the commode and when she wipes when using the restroom.

## 2023-12-17 NOTE — ED Triage Notes (Signed)
 Pt reports since discharge home, she has almost fallen when ambulating around the home and has been experiencing an unsteady gait.

## 2023-12-17 NOTE — Discharge Instructions (Signed)
 Continue to monitor your symptoms and return for any new or worsening ones.  Follow-up with your primary care doctor early next week to have your hemoglobin rechecked.  Call and make an appointment with the office.  Take your GoLytely  as needed for constipation.  Stay on your other medications as prescribed.

## 2023-12-17 NOTE — ED Provider Notes (Signed)
 [place] EMERGENCY DEPARTMENT AT [facility] Provider Note   CSN: [id number] Arrival date & time: 12/17/23  8179     Patient presents with: Rectal Bleeding (/)   Becky [name] is a 63 y.o. female.   63 year old female presents for evaluation of rectal bleeding.  Recently had some hemorrhoid issues and states that still bleeding.  She does have a history of anemia and has required transfusions in the past.  Denies any rectal pain.  States she is actually bit constipated last few days.  Denies any other symptoms or concerns.   Rectal Bleeding Associated symptoms: no abdominal pain, no fever and no vomiting        Prior to Admission medications  Medication Sig Start Date End Date Taking? Authorizing Provider  magnesium  citrate SOLN Take 296 mLs (1 Bottle total) by mouth once for 1 dose. 12/17/23 12/17/23 Yes Brayli Klingbeil L, DO  acetaminophen  (TYLENOL ) 500 MG tablet Take 1,000 mg by mouth every 6 (six) hours as needed for mild pain (pain score 1-3).    [provider]  albuterol  (VENTOLIN  HFA) 108 (90 Base) MCG/ACT inhaler Inhale 2 puffs into the lungs every 4 (four) hours as needed for wheezing or shortness of breath. 12/07/21   Stuart Vernell Norris, PA-C  aspirin  EC 81 MG tablet Take 1 tablet (81 mg total) by mouth daily. Swallow whole. 07/25/23   Ricky Fines, MD  Azelastine  HCl 137 MCG/SPRAY SOLN Place 1 spray into both nostrils daily. 10/02/23   [provider]  dexlansoprazole  (DEXILANT ) 60 MG capsule Take 1 capsule (60 mg total) by mouth daily. 10/15/23 10/14/24  Cindie Carlin POUR, DO  diltiazem  (TIADYLT  ER) 360 MG 24 hr capsule TAKE ONE (1) CAPSULE BY MOUTH ONCE DAILY *NEW PRESCRIPTION REQUEST* 08/05/23   Alvan Dorn FALCON, MD  EPINEPHrine  0.3 mg/0.3 mL IJ SOAJ injection Inject 0.3 mg into the muscle once as needed for anaphylaxis. 04/20/18   [provider]  fluconazole  (DIFLUCAN ) 100 MG tablet Take 100 mg by mouth daily. 10/06/23    [provider]  folic acid  (FOLVITE ) 1 MG tablet Take 1 tablet (1 mg total) by mouth daily. 06/18/23   Davonna Siad, MD  lidocaine -prilocaine  (EMLA ) cream APPLY TOPICALLY TO AFFECTED AREAS ONCE DAILY 09/14/23   Sherrod Sherrod, MD  linaclotide  (LINZESS ) 72 MCG capsule Take 1 capsule (72 mcg total) by mouth daily before breakfast. 05/05/23   Shirlean Therisa ORN, NP  metoprolol  tartrate (LOPRESSOR ) 25 MG tablet Take 0.5 tablets (12.5 mg total) by mouth 2 (two) times daily as needed. Patient taking differently: Take 12.5 mg by mouth 2 (two) times daily as needed (heart rate). PRN, depending on heart rate, pt states per cardiologist 11/24/23   Miriam Norris, NP  montelukast  (SINGULAIR ) 10 MG tablet Take 10 mg by mouth at bedtime. 08/25/16   [provider]  NON FORMULARY Pt uses a cpap nightly    [provider]  ondansetron  (ZOFRAN ) 8 MG tablet Take 1 tablet (8 mg total) by mouth every 8 (eight) hours as needed for nausea or vomiting. 11/22/23   Lue Elsie BROCKS, MD  polyethylene glycol (MIRALAX ) 17 g packet Take 17 g by mouth 2 (two) times daily. 11/22/23   Lue Elsie BROCKS, MD  potassium chloride  SA (KLOR-CON  M) 20 MEQ tablet Take 1 tablet (20 mEq total) by mouth 2 (two) times daily. 11/25/23   Heilingoetter, Cassandra L, PA-C  prochlorperazine  (COMPAZINE ) 10 MG tablet Take 1 tablet (10 mg total) by mouth every 6 (  six) hours as needed. 11/09/23   Heilingoetter, Cassandra L, PA-C  RESTASIS  0.05 % ophthalmic emulsion Place 1 drop into both eyes daily as needed (dry eyes). 04/24/23   [provider]  rivaroxaban  (XARELTO ) 20 MG TABS tablet Take 1 tablet (20 mg total) by mouth daily. 08/24/23   Alvan Dorn FALCON, MD  rosuvastatin  (CRESTOR ) 20 MG tablet Take 1 tablet (20 mg total) by mouth daily. 09/28/23   Gladis, Mary-Margaret, FNP  SENEXON-S 8.6-50 MG tablet Take 1 tablet by mouth 2 (two) times daily. Patient taking differently: Take 1 tablet by mouth as needed for  moderate constipation. As needed, moderate constipation 11/22/23   Lue Elsie BROCKS, MD  SYNTHROID  25 MCG tablet Take 1 tablet (25 mcg total) by mouth daily before breakfast. 09/28/23   [name], FNP    Allergies: Wound dressing adhesive, Apresoline  [hydralazine ], Latex, Other, and Prednisone     Review of Systems  Constitutional:  Positive for fatigue. Negative for chills and fever.  HENT:  Negative for ear pain and sore throat.   Eyes:  Negative for pain and visual disturbance.  Respiratory:  Positive for shortness of breath. Negative for cough.   Cardiovascular:  Negative for chest pain and palpitations.  Gastrointestinal:  Positive for hematochezia. Negative for abdominal pain and vomiting.  Genitourinary:  Negative for dysuria and hematuria.  Musculoskeletal:  Negative for arthralgias and back pain.  Skin:  Negative for color change and rash.  Neurological:  Negative for seizures and syncope.  All other systems reviewed and are negative.   Updated Vital Signs BP 120/76   Pulse 78   Temp 99 F (37.2 C) (Oral)   Resp 20   Ht 5' 7 (1.702 m)   Wt 98.4 kg   SpO2 100%   BMI 33.98 kg/m   Physical Exam Vitals and nursing note reviewed.  Constitutional:      General: She is not in acute distress.    Appearance: Normal appearance. She is well-developed. She is not ill-appearing.  HENT:     Head: Normocephalic and atraumatic.  Eyes:     Conjunctiva/sclera: Conjunctivae normal.  Cardiovascular:     Rate and Rhythm: Normal rate and regular rhythm.     Heart sounds: No murmur heard. Pulmonary:     Effort: Pulmonary effort is normal. No respiratory distress.     Breath sounds: Normal breath sounds.  Abdominal:     Palpations: Abdomen is soft.     Tenderness: There is no abdominal tenderness.  Musculoskeletal:        General: No swelling.     Cervical back: Neck supple.  Skin:    General: Skin is warm and dry.     Capillary Refill: Capillary refill takes  less than 2 seconds.  Neurological:     Mental Status: She is alert.  Psychiatric:        Mood and Affect: Mood normal.     (all labs ordered are listed, but only abnormal results are displayed) Labs Reviewed  CBC WITH DIFFERENTIAL/PLATELET - Abnormal; Notable for the following components:      Result Value   WBC 3.8 (*)    RBC 2.82 (*)    Hemoglobin 8.7 (*)    HCT 25.9 (*)    Platelets 118 (*)    Abs Immature Granulocytes 0.14 (*)    All other components within normal limits  COMPREHENSIVE METABOLIC PANEL WITH GFR - Abnormal; Notable for the following components:   CO2 20 (*)  Glucose, Bld 103 (*)    BUN <5 (*)    Creatinine, Ser 1.24 (*)    Total Protein 6.4 (*)    GFR, Estimated 49 (*)    All other components within normal limits  TYPE AND SCREEN    EKG: None  Radiology: No results found.   Procedures   Medications Ordered in the ED - No data to display                                  Medical Decision Making Patient here for rectal bleeding.  Hemoglobin for her is stable and actually improved from her baseline.  She does have some baseline anemia.  Vitals are stable labs otherwise fairly unremarkable.  Advised to continue her stool softeners.  Will give her GoLytely  to use as needed.  She has not had any active bleeding in the emergency department.  Recommended close follow-up with GI.  Advise close up with primary care and return for any new or worsening symptoms.  Advised to have her hemoglobin rechecked by primary care next week.  All results and plan discussed with patient and family at bedside and they feel comfortable with plan for discharge  Problems Addressed: Anemia, unspecified type: acute illness or injury Bleeding hemorrhoid: acute illness or injury Constipation, unspecified constipation type: chronic illness or injury  Amount and/or Complexity of Data Reviewed External Data Reviewed: notes.    Details: Prior hospital records reviewed patient  recently admitted for lower GI bleed Labs: ordered. Decision-making details documented in ED Course.    Details: Ordered and reviewed by me and patient's hemoglobin is actually improved from her most recent, but she is anemic  Risk OTC drugs. Prescription drug management.     Final diagnoses:  Bleeding hemorrhoid  Constipation, unspecified constipation type  Anemia, unspecified type    ED Discharge Orders          Ordered    magnesium  citrate SOLN   Once       Note to Pharmacy: Use 1/4-1/2 a bottle as needed for constipation   12/17/23 2147               Gennaro Duwaine CROME, DO 12/17/23 2322

## 2023-12-17 NOTE — Telephone Encounter (Signed)
 She has had a thorough work-up. We can recheck H/H now if persistent bleeding.

## 2023-12-17 NOTE — Telephone Encounter (Signed)
 error

## 2023-12-18 ENCOUNTER — Other Ambulatory Visit: Payer: Self-pay | Admitting: *Deleted

## 2023-12-18 ENCOUNTER — Telehealth (HOSPITAL_COMMUNITY): Payer: Self-pay | Admitting: Emergency Medicine

## 2023-12-18 MED ORDER — POLYETHYLENE GLYCOL 3350 17 G PO PACK: 17.0000 g | PACK | Freq: Two times a day (BID) | ORAL | 1 refills | Status: AC

## 2023-12-18 NOTE — Patient Outreach (Signed)
 Transition of Care week 2  Visit Note  12/18/2023  Name: Becky [name] MRN: [id number]          DOB: [birth year]  Situation: Patient enrolled in [facility] 30-day program. Visit completed with patient by telephone.   Background:  Discharge Date and Diagnosis: 12/10/23, hematochezia   Past Medical History:  Diagnosis Date   Anemia    Arthritis    Asthma    Atrial fibrillation (HCC) 2018   Back pain    BV (bacterial vaginosis) 05/23/2013   Constipation 11/21/2014   Coronary artery disease    Current use of long term anticoagulation 2018   Diabetes mellitus without complication (HCC)    Dysphagia 2011   Fibroids 03/13/2016   GERD (gastroesophageal reflux disease)    Goiter 2009   Hematochezia 2011   Hematuria 05/23/2013   History of radiation therapy    Right lung, right ribs, brain- 07/08/23-07/29/23- Dr. 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 diagnosed in 11/2016 --> started on Xarelto  for anticoagulation   Pelvic pain in female 11/02/2013   Plantar fasciitis of right foot    Pre-diabetes    Sleep apnea    dont use cpap says causes sinus infection   Stroke (HCC)    04/07/23; 06/2023; 07/2023    Assessment: Patient Reported Symptoms: Cognitive Cognitive Status: No symptoms reported, Alert and oriented to person, place, and time, Able to follow simple commands, Normal speech and language skills      Neurological Neurological Review of Symptoms: No symptoms reported    HEENT HEENT Symptoms Reported: No symptoms reported      Cardiovascular Cardiovascular Symptoms Reported: No symptoms reported Does patient have uncontrolled Hypertension?: No Is patient checking Blood Pressure at home?: Yes Patient's Recent  BP reading at home: 126/81    Respiratory Respiratory Symptoms Reported: Shortness of breath Other Respiratory Symptoms: dyspnea with exertion only Respiratory Management Strategies: Adequate rest, Routine screening Respiratory Self-Management Outcome: 4 (good)  Endocrine Endocrine Symptoms Reported: No symptoms reported Is patient diabetic?: Yes Is patient checking blood sugars at home?: Yes List most recent blood sugar readings, include date and time of day: states  blood sugar good  no reading provided Endocrine Self-Management Outcome: 4 (good)  Gastrointestinal Gastrointestinal Symptoms Reported: Other Other Gastrointestinal Symptoms: pt states still has  a little blood at times from hemorrhoid  went to ED on 12/11 for bleeding internal hemorroid Additional Gastrointestinal Details: pt is taking linzess , miralax  and senekot Gastrointestinal Management Strategies: Adequate rest, Activity, Medication therapy Gastrointestinal Self-Management Outcome: 3 (uncertain) Gastrointestinal Comment: reinforced importance of not straining with bowel movements, taking stool softeners as prescribed    Genitourinary Genitourinary Symptoms Reported: No symptoms reported    Integumentary Integumentary Symptoms Reported: No symptoms reported    Musculoskeletal Musculoskelatal Symptoms Reviewed: Limited mobility, Weakness Additional Musculoskeletal Details: going to outpatient PT, continues using walker and cane Musculoskeletal Management Strategies: Activity, Adequate rest, Routine screening Musculoskeletal Self-Management Outcome: 4 (good) Musculoskeletal Comment: reinforced safety precautions      Psychosocial Psychosocial Symptoms Reported: No symptoms reported         Today's Vitals   12/18/23 1332  BP: 126/81   Pain Scale: 0-10 Pain Score: 0-No pain  Medications Reviewed Today     Reviewed by Aura Mliss LABOR, RN (Registered Nurse) on 12/18/23 at 1327  Med List Status: <None>    Medication Order Taking? Sig Documenting Provider Last Dose Status Informant  acetaminophen  (TYLENOL ) 500 MG tablet [id number]  Take 1,000 mg by mouth every 6 (six) hours as needed for mild pain (pain score 1-3). [provider]  Active Pharmacy Records, Self, Child, Multiple Informants  albuterol  (VENTOLIN  HFA) 108 (90 Base) MCG/ACT inhaler [id number]  Inhale 2 puffs into the lungs every 4 (four) hours as needed for wheezing or shortness of breath. Stuart Vernell Norris, PA-C  Active Pharmacy Records, Self, Child, Multiple Informants  aspirin  EC 81 MG tablet [id number]  Take 1 tablet (81 mg total) by mouth daily. Swallow whole. Ricky Fines, MD  Active Pharmacy Records, Self, Child, Multiple Informants  Azelastine  HCl 137 MCG/SPRAY SOLN [id number]  Place 1 spray into both nostrils daily. [provider]  Active Self, Child, Pharmacy Records, Multiple Informants  dexlansoprazole  (DEXILANT ) 60 MG capsule [id number]  Take 1 capsule (60 mg total) by mouth daily. Cindie Carlin POUR, DO  Active Self, Child, Pharmacy Records, Multiple Informants  diltiazem  (TIADYLT  ER) 360 MG 24 hr capsule [id number]  TAKE ONE (1) CAPSULE BY MOUTH ONCE DAILY *NEW PRESCRIPTION REQUEST* Branch, Dorn FALCON, MD  Active Pharmacy Records, Self, Child, Multiple Informants  EPINEPHrine  0.3 mg/0.3 mL IJ SOAJ injection [id number]  Inject 0.3 mg into the muscle once as needed for anaphylaxis. [provider]  Active Pharmacy Records, Self, Child, Multiple Informants           Med Note LESLY, RICHERD CINDERELLA Debar Nov 24, 2023  2:05 PM) Check for expiration date  fluconazole  (DIFLUCAN ) 100 MG tablet [id number]  Take 100 mg by mouth daily. [provider]  Active Self, Child, Pharmacy Records, Multiple Informants  folic acid  (FOLVITE ) 1 MG tablet [id number]  Take 1 tablet (1 mg total) by mouth daily. Davonna Siad, MD  Active Pharmacy Records, Self, Child, Multiple Informants  lidocaine -prilocaine  (EMLA )  cream [id number]  APPLY TOPICALLY TO AFFECTED AREAS ONCE DAILY Sherrod Sherrod, MD  Active Self, Pharmacy Records, Child, Multiple Informants  linaclotide  (LINZESS ) 72 MCG capsule [id number]  Take 1 capsule (72 mcg total) by mouth daily before breakfast. Shirlean Therisa ORN, NP  Active Pharmacy Records, Self, Child, Multiple Informants           Med Note LESLY, RICHERD CINDERELLA   Tue Nov 24, 2023  2:03 PM) Only as needed  metoprolol  tartrate (LOPRESSOR ) 25 MG tablet [id number]  Take 0.5 tablets (12.5 mg total) by mouth 2 (two) times daily as needed.  Patient taking differently: Take 12.5 mg by mouth 2 (two) times daily as needed (heart rate). PRN, depending on heart rate, pt states per cardiologist   Miriam Norris, NP  Active Self, Child, Pharmacy Records, Multiple Informants  montelukast  (SINGULAIR ) 10 MG tablet [id number]  Take 10 mg by mouth at bedtime. [provider]  Active Pharmacy Records, Self, Child, Multiple Informants  NON FORMULARY [id number]  Pt uses a cpap nightly [provider]  Active Pharmacy Records, Self, Child, Multiple Informants  ondansetron  (ZOFRAN ) 8 MG tablet [id number]  Take 1 tablet (8 mg total) by mouth every 8 (eight) hours as needed for nausea or vomiting. Lue Elsie BROCKS, MD  Active Self, Child, Pharmacy Records, Multiple Informants  polyethylene glycol (MIRALAX ) 17 g packet [id number]  Take 17 g by mouth 2 (two) times daily. Lue Elsie BROCKS, MD  Active Self, Child, Pharmacy Records, Multiple Informants           Med Note LESLY, RICHERD GRADE   Tue Nov 24, 2023  2:01 PM) Need to pick up  potassium chloride  SA (KLOR-CON  M) 20 MEQ tablet [id number]  Take 1 tablet (20 mEq total) by mouth 2 (two) times daily. Heilingoetter, Cassandra L, PA-C  Active Self, Child, Pharmacy Records, Multiple Informants  prochlorperazine  (COMPAZINE ) 10 MG tablet [id number]  Take 1 tablet (10 mg total) by mouth every 6 (six) hours as needed. Heilingoetter, Cassandra L, PA-C  Active  Self, Child, Pharmacy Records, Multiple Informants  RESTASIS  0.05 % ophthalmic emulsion [id number]  Place 1 drop into both eyes daily as needed (dry eyes). [provider]  Active Pharmacy Records, Self, Child, Multiple Informants  rivaroxaban  (XARELTO ) 20 MG TABS tablet [id number]  Take 1 tablet (20 mg total) by mouth daily. Alvan Dorn FALCON, MD  Active Pharmacy Records, Self, Child, Multiple Informants  rosuvastatin  (CRESTOR ) 20 MG tablet [id number]  Take 1 tablet (20 mg total) by mouth daily. [name], FNP  Active Self, Child, Pharmacy Records, Multiple Informants  SENEXON-S 8.6-50 MG tablet [id number]  Take 1 tablet by mouth 2 (two) times daily.  Patient taking differently: Take 1 tablet by mouth as needed for moderate constipation. As needed, moderate constipation   Lue Elsie BROCKS, MD  Active Self, Child, Pharmacy Records, Multiple Informants  SYNTHROID  25 MCG tablet [id number]  Take 1 tablet (25 mcg total) by mouth daily before breakfast. [name], FNP  Active Self, Child, Pharmacy Records, Multiple Informants            Goals Addressed             This Visit's Progress    [facility] Transitions of Care (TOC) Care Plan       Problems:  Recent Hospitalization for treatment of Hematochezia - Patient is per DC Summary - is a 63 y.o. 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, atrial fibrillation, hypertension, Diabetes, internal hemorrhoids Pt reports she still has scant amount of blood at times with bowel movements, pt states she is not straining with bowel movements, pt has GI follow up scheduled for 12/30, primary care provider 12/15, went to ED 12/11 for bleeding internal hemorroid  Goal:  Over the next 30 days, the patient will not experience hospital readmission  Interventions:  Transitions of Care: Doctor Visits  - discussed the importance of doctor visits  Reviewed discharge instructions with  pt Call MD for: difficulty breathing, headache or visual disturbances Call MD for: extreme fatigue Call MD for: blood in stool Call MD for: persistant dizziness or light-headedness Call MD for: persistant nausea and vomiting Call MD for: severe uncontrolled pain Call MD for: temperature >100.4 Go to ED for chest pain, unrelieved dyspnea, fainting, increased blood in stool Reviewed avoiding sick persons, washing hands well, wearing a mask as needed Reinforced diet - low sodium, heart healthy, carbohydrate modified Increase activity slowly Reinforced importance of not straining with bowel movements, using stool softeners as needed, sitz bath Reviewed safety precautions  Patient Self Care Activities:  Attend all scheduled provider appointments Call pharmacy for medication refills 3-7 days in advance of running out of medications Call provider office for new concerns or questions  Notify RN Care Manager of [facility] call rescheduling needs Participate in Transition of Care Program/Attend TOC scheduled calls Take medications as prescribed    Plan:  The care management team will reach  out to the patient again over the next 5 -10 business days. The patient has been provided with contact information for the care management team and has been advised to call with any health related questions or concerns.  Patient is scheduled for TOC follow up on 12/24/23 @ 330 pm           Recommendation:   PCP Follow-up Specialty provider follow-up GI  Follow Up Plan:   Telephone follow-up 12/24/23 @ 315 pm  [name] [facility], BSN RN Care Manager/ Transition of Care [place]/ [facility] Population [facility] [phone]

## 2023-12-18 NOTE — ED Notes (Signed)
 Pt called and verbalized her prescription was not at pharmacy. Edp aware and sent script to pharmacy. Pt aware. 12/18/23 1815hrs

## 2023-12-18 NOTE — Patient Instructions (Signed)
 Visit Information  Thank you for taking time to visit with me today. Please don't hesitate to contact me if I can be of assistance to you before our next scheduled telephone appointment.  Our next appointment is by telephone on 12/24/23 @ 330 pm  Following is a copy of your care plan:   Goals Addressed             This Visit's Progress    [facility] Transitions of Care (TOC) Care Plan       Problems:  Recent Hospitalization for treatment of Hematochezia - Patient is per DC Summary - is a 63 y.o. 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, atrial fibrillation, hypertension, Diabetes, internal hemorrhoids Pt reports she still has scant amount of blood at times with bowel movements, pt states she is not straining with bowel movements, pt has GI follow up scheduled for 12/30, primary care provider 12/15, went to ED 12/11 for bleeding internal hemorroid  Goal:  Over the next 30 days, the patient will not experience hospital readmission  Interventions:  Transitions of Care: Doctor Visits  - discussed the importance of doctor visits  Reviewed discharge instructions with pt Call MD for: difficulty breathing, headache or visual disturbances Call MD for: extreme fatigue Call MD for: blood in stool Call MD for: persistant dizziness or light-headedness Call MD for: persistant nausea and vomiting Call MD for: severe uncontrolled pain Call MD for: temperature >100.4 Go to ED for chest pain, unrelieved dyspnea, fainting, increased blood in stool Reviewed avoiding sick persons, washing hands well, wearing a mask as needed Reinforced diet - low sodium, heart healthy, carbohydrate modified Increase activity slowly Reinforced importance of not straining with bowel movements, using stool softeners as needed, sitz bath Reviewed safety precautions  Patient Self Care Activities:  Attend all scheduled provider appointments Call pharmacy for medication refills  3-7 days in advance of running out of medications Call provider office for new concerns or questions  Notify RN Care Manager of TOC call rescheduling needs Participate in Transition of Care Program/Attend TOC scheduled calls Take medications as prescribed    Plan:  The care management team will reach out to the patient again over the next 5 -10 business days. The patient has been provided with contact information for the care management team and has been advised to call with any health related questions or concerns.  Patient is scheduled for TOC follow up on 12/24/23 @ 330 pm          Care plan and visit instructions communicated with the patient verbally today. Patient agrees to receive a copy in MyChart. Active MyChart status and patient understanding of how to access instructions and care plan via MyChart confirmed with patient.     Telephone follow up appointment with care management team member scheduled for: 12/24/23 @ 315 pm  Please call the care guide team at [phone] if you need to cancel or reschedule your appointment.   Please call the Suicide and Crisis Lifeline: 988 call the [place]  National Suicide Prevention Lifeline: (425)536-0302 or TTY: 6075338836 TTY (508)867-6812) to talk to a trained counselor call 1-800-273-TALK (toll free, 24 hour hotline) go to [facility] [address], [place] [phone]) call the [facility] Crisis Line: (501)485-5657 call 911 if you are experiencing a Mental Health or Behavioral Health Crisis or need someone to talk to.  [name] RNC, BSN RN Care Manager/ Transition of Care [place]/ [facility] Population  Health [phone]

## 2023-12-18 NOTE — Telephone Encounter (Signed)
 Magnesium  citrate prescription reportedly not at pharmacy.  Reviewing notes will send MiraLAX  instead since, was updated contraindicated due to kidney function.  Note from provider also mentions giving MiraLAX  and not magnesium  citrate.

## 2023-12-21 ENCOUNTER — Encounter: Payer: Self-pay | Admitting: Nurse Practitioner

## 2023-12-21 ENCOUNTER — Ambulatory Visit: Payer: Self-pay | Admitting: Nurse Practitioner

## 2023-12-21 ENCOUNTER — Ambulatory Visit: Admitting: Nurse Practitioner

## 2023-12-21 ENCOUNTER — Other Ambulatory Visit: Payer: Self-pay

## 2023-12-21 VITALS — BP 141/79 | HR 89 | Temp 98.3°F | Ht 67.0 in | Wt 210.0 lb

## 2023-12-21 DIAGNOSIS — D509 Iron deficiency anemia, unspecified: Secondary | ICD-10-CM

## 2023-12-21 DIAGNOSIS — K625 Hemorrhage of anus and rectum: Secondary | ICD-10-CM

## 2023-12-21 DIAGNOSIS — D649 Anemia, unspecified: Secondary | ICD-10-CM

## 2023-12-21 DIAGNOSIS — K5901 Slow transit constipation: Secondary | ICD-10-CM

## 2023-12-21 DIAGNOSIS — Z09 Encounter for follow-up examination after completed treatment for conditions other than malignant neoplasm: Secondary | ICD-10-CM

## 2023-12-21 LAB — HEMOGLOBIN, FINGERSTICK: Hemoglobin: 8.6 g/dL — ABNORMAL LOW (ref 11.1–15.9)

## 2023-12-21 MED ORDER — LINACLOTIDE 145 MCG PO CAPS: 145.0000 ug | ORAL_CAPSULE | Freq: Every day | ORAL | 2 refills | Status: DC

## 2023-12-21 NOTE — Telephone Encounter (Signed)
 She has been on Linzess  in the past. Need to make sure taking this daily. We may need to increase the dose.  Can take Miralax  daily also to BID to get things moving. Really need to avoid any constipation, straining, etc due to internal hemorrhoids.   With not having a BM in 11 days,. Really needs office visit sooner than 12/30.

## 2023-12-21 NOTE — Progress Notes (Signed)
 Subjective:    Patient ID: Becky [name], female    DOB: [birth year], 63 y.o.   MRN: [id number]   Chief Complaint: hospital follow up  HPI  Patient went to the ED on 12/17/23 with rectal bleeding. Had hemorrhoid issues prior to that visit. She has history of anemia so she wanted to make sure her hgb was ok. Had no active bleeding while in ED. Hgb was slightly low but vitals were stable. Had bleeding for 2-3 days after coming home. But has stopped now. Is taking her stool softeners daily. She says she is now constipated. Says she has not pooped at all since coming home from hospital.   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) 07/16/2023   Anxiety 07/15/2023   Obesity, class 2 07/12/2023   Folate deficiency 06/18/2023   Non-small cell carcinoma of lung (HCC) 06/04/2023   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) 06/04/2023   Metastasis to bone (HCC) 06/04/2023   Hematochezia 04/09/2023   Stroke-like symptoms 04/08/2023   History of CVA (cerebrovascular accident) 04/07/2023   Prolapsed internal hemorrhoids, grade 3 05/02/2021   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   Prolapsed internal hemorrhoids, grade 2 10/11/2020   Unilateral primary osteoarthritis, left knee 08/02/2020   History of GI bleed 07/18/2020   Normocytic anemia    Lower GI bleed 09/17/2018   Current use of long term anticoagulation  11/12/2016   Atrial fibrillation, chronic (HCC) 11/07/2016   Coronary artery disease due to lipid rich plaque    Fibroids 03/13/2016   Varicose 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 07/23/2015   Constipation 11/21/2014   Hematuria 05/23/2013   GOITER 03/10/2007   Hypothyroidism 03/10/2007   Type 2 diabetes mellitus with hyperlipidemia (HCC) 03/10/2007   Hyperlipidemia 03/10/2007   ANEMIA-IRON DEFICIENCY 03/10/2007   Essential hypertension 03/10/2007   Gastroesophageal reflux disease 03/10/2007       Review of Systems  Constitutional:  Negative for diaphoresis.  Eyes:  Negative for pain.  Respiratory:  Negative for shortness of breath.   Cardiovascular:  Negative for chest pain, palpitations and leg swelling.  Gastrointestinal:  Negative for abdominal pain.  Endocrine: Negative for polydipsia.  Skin:  Negative for rash.  Neurological:  Negative for dizziness, weakness and headaches.  Hematological:  Does not bruise/bleed easily.  All other systems reviewed and are negative.      Objective:   Physical Exam Constitutional:      Appearance: Normal appearance.  Cardiovascular:     Rate and Rhythm: Normal rate and regular rhythm.     Heart sounds: Normal heart sounds.  Pulmonary:     Breath sounds: Normal breath sounds.  Skin:    General: Skin is warm.  Neurological:     General: No focal deficit present.     Mental Status: She is alert and oriented to person, place, and time.  Psychiatric:        Mood and Affect: Mood normal.  Behavior: Behavior normal.    BP (!) 141/79   Pulse 89   Temp 98.3 F (36.8 C) (Temporal)   Ht 5' 7 (1.702 m)   Wt 210 lb (95.3 kg)   BMI 32.89 kg/m         Assessment & Plan:   Becky [name] in today with chief complaint of Hospitalization Follow-up   1. Iron deficiency anemia, unspecified iron deficiency anemia type (Primary) Labs pending - Hemoglobin, fingerstick  2. Rectal bleeding Continue miralax   daily  3. Slow transit constipation Dulcolax or mag citrate today Increase linzess  to 145mg  daily Increase fiber in diet Meds ordered this encounter  Medications   linaclotide  (LINZESS ) 145 MCG CAPS capsule    Sig: Take 1 capsule (145 mcg total) by mouth daily before breakfast.    Dispense:  30 capsule    Refill:  2    Supervising Provider:   MARYANNE CHEW A [[id number]]    4. Hospital discharge follow-up Hospital records reviewed  Wt Readings from Last 3 Encounters:  12/21/23 210 lb (95.3 kg)  12/17/23 216 lb 14.9 oz (98.4 kg)  12/09/23 216 lb 14.9 oz (98.4 kg)     The above assessment and management plan was discussed with the patient. The patient verbalized understanding of and has agreed to the management plan. Patient is aware to call the clinic if symptoms persist or worsen. Patient is aware when to return to the clinic for a follow-up visit. Patient educated on when it is appropriate to go to the emergency department.   Mary-Margaret Gladis, FNP

## 2023-12-21 NOTE — Patient Instructions (Signed)

## 2023-12-21 NOTE — Telephone Encounter (Signed)
 Phoned and spoke with the pt and pt did go to the ED 12/11. Labs were done. Pt states she is in pain because it has been 11 days since she has had a BM. Please advise what she can do regarding this matter.

## 2023-12-22 ENCOUNTER — Inpatient Hospital Stay

## 2023-12-22 ENCOUNTER — Inpatient Hospital Stay: Admitting: Internal Medicine

## 2023-12-22 ENCOUNTER — Other Ambulatory Visit: Payer: Self-pay

## 2023-12-22 ENCOUNTER — Inpatient Hospital Stay: Admitting: Dietician

## 2023-12-22 VITALS — BP 135/106 | HR 103 | Resp 17

## 2023-12-22 VITALS — BP 121/76 | HR 64 | Temp 98.6°F | Resp 17 | Ht 67.0 in | Wt 209.0 lb

## 2023-12-22 DIAGNOSIS — C3490 Malignant neoplasm of unspecified part of unspecified bronchus or lung: Secondary | ICD-10-CM | POA: Diagnosis not present

## 2023-12-22 DIAGNOSIS — Z5112 Encounter for antineoplastic immunotherapy: Secondary | ICD-10-CM | POA: Diagnosis not present

## 2023-12-22 DIAGNOSIS — C3491 Malignant neoplasm of unspecified part of right bronchus or lung: Secondary | ICD-10-CM

## 2023-12-22 DIAGNOSIS — D649 Anemia, unspecified: Secondary | ICD-10-CM

## 2023-12-22 LAB — CBC WITH DIFFERENTIAL (CANCER CENTER ONLY)
Abs Immature Granulocytes: 0.03 K/uL (ref 0.00–0.07)
Basophils Absolute: 0 K/uL (ref 0.0–0.1)
Basophils Relative: 0 %
Eosinophils Absolute: 0.1 K/uL (ref 0.0–0.5)
Eosinophils Relative: 2 %
HCT: 25 % — ABNORMAL LOW (ref 36.0–46.0)
Hemoglobin: 8.8 g/dL — ABNORMAL LOW (ref 12.0–15.0)
Immature Granulocytes: 1 %
Lymphocytes Relative: 11 %
Lymphs Abs: 0.6 K/uL — ABNORMAL LOW (ref 0.7–4.0)
MCH: 31.2 pg (ref 26.0–34.0)
MCHC: 35.2 g/dL (ref 30.0–36.0)
MCV: 88.7 fL (ref 80.0–100.0)
Monocytes Absolute: 0.9 K/uL (ref 0.1–1.0)
Monocytes Relative: 17 %
Neutro Abs: 3.8 K/uL (ref 1.7–7.7)
Neutrophils Relative %: 69 %
Platelet Count: 264 K/uL (ref 150–400)
RBC: 2.82 MIL/uL — ABNORMAL LOW (ref 3.87–5.11)
RDW: 15 % (ref 11.5–15.5)
WBC Count: 5.5 K/uL (ref 4.0–10.5)
nRBC: 0 % (ref 0.0–0.2)

## 2023-12-22 LAB — CMP (CANCER CENTER ONLY)
ALT: 25 U/L (ref 0–44)
AST: 33 U/L (ref 15–41)
Albumin: 4.2 g/dL (ref 3.5–5.0)
Alkaline Phosphatase: 72 U/L (ref 38–126)
Anion gap: 13 (ref 5–15)
BUN: 9 mg/dL (ref 8–23)
CO2: 23 mmol/L (ref 22–32)
Calcium: 10.2 mg/dL (ref 8.9–10.3)
Chloride: 104 mmol/L (ref 98–111)
Creatinine: 1.38 mg/dL — ABNORMAL HIGH (ref 0.44–1.00)
GFR, Estimated: 43 mL/min — ABNORMAL LOW (ref >60)
Glucose, Bld: 104 mg/dL — ABNORMAL HIGH (ref 70–99)
Potassium: 3.7 mmol/L (ref 3.5–5.1)
Sodium: 141 mmol/L (ref 135–145)
Total Bilirubin: 0.4 mg/dL (ref 0.0–1.2)
Total Protein: 6.7 g/dL (ref 6.5–8.1)

## 2023-12-22 LAB — SAMPLE TO BLOOD BANK

## 2023-12-22 LAB — MAGNESIUM: Magnesium: 1.6 mg/dL — ABNORMAL LOW (ref 1.7–2.4)

## 2023-12-22 MED ORDER — LORAZEPAM 0.5 MG PO TABS: ORAL_TABLET | ORAL | 0 refills | Status: DC

## 2023-12-22 MED ORDER — SODIUM CHLORIDE 0.9 % IV SOLN: INTRAVENOUS | Status: DC

## 2023-12-22 MED ORDER — MAGNESIUM OXIDE -MG SUPPLEMENT 400 (240 MG) MG PO TABS: 400.0000 mg | ORAL_TABLET | Freq: Two times a day (BID) | ORAL | 0 refills | Status: DC

## 2023-12-22 MED ORDER — SODIUM CHLORIDE 0.9 % IV SOLN
400.0000 mg/m2 | Freq: Once | INTRAVENOUS | Status: AC
  Administered 2023-12-22: 11:00:00 900 mg via INTRAVENOUS
  Filled 2023-12-22: qty 20

## 2023-12-22 MED ORDER — PROCHLORPERAZINE MALEATE 10 MG PO TABS
10.0000 mg | ORAL_TABLET | Freq: Once | ORAL | Status: AC
  Administered 2023-12-22: 10:00:00 10 mg via ORAL
  Filled 2023-12-22: qty 1

## 2023-12-22 MED ORDER — SODIUM CHLORIDE 0.9 % IV SOLN
200.0000 mg | Freq: Once | INTRAVENOUS | Status: AC
  Administered 2023-12-22: 11:00:00 200 mg via INTRAVENOUS
  Filled 2023-12-22: qty 200

## 2023-12-22 NOTE — Patient Outreach (Signed)
 Erroneous encounter. Unable to maintain contact with patient.

## 2023-12-22 NOTE — Progress Notes (Signed)
 Nutrition Follow-up:  Patient with stage IV non-small cell lung cancer. Currently receiving alimta  + keytruda .    12/11 - ED evaluation for bleeding hemorrhoid  11/11-11/16 - admission with colitis/hypokalemia   Met with patient in infusion. Daughter is present at visit today. Patient reports rectal bleeding and constipation have resolved. Patient continues to have poor appetite due to altered taste. She is doing baking soda salt water  rinses after brushing. Has not tried before eating. Tolerating oatmeal, green beans, pinto beans, and white beans per daughter. Only eating a few bites. Patient says tart flavors taste normal. Patient reports smells of mixed foods are nauseating. Also having increased phlegm which provokes nausea. She did try KF sample. This was horrible. Currently drinking one Ensure Plant-based supplement which is tolerable.   Medications: linzess  (12/15)  Labs: reviewed   Anthropometrics: Wt 209 lb today decreased 3.7% in 3 weeks, down 9.9% in the last 2 months which is severe   11/25 - 217 lb 11/3 - 226 lb  10/16 - 232 lb    NUTRITION DIAGNOSIS: Inadequate oral intake continues    INTERVENTION:  Reviewed strategies for taste changes. Lean into flavors that work (choosing tart foods/marinades/sauces, squeezing lemon juice to tone down over sweet taste) Suggested trying baking soda salt water  rinses prior to eating  Continue Ensure Plant based - recommend increasing 2/day (coupons provided) Pt agreeable to trying greek yogurt during infusion which she found tolerable     MONITORING, EVALUATION, GOAL: wt trends, intake   NEXT VISIT: To be determined

## 2023-12-22 NOTE — Patient Instructions (Addendum)
 [facility] - A DEPT OF [facility]  Discharge Instructions: Thank you for choosing [facility] to provide your oncology and hematology care.   If you have a lab appointment with the Cancer Center, please go directly to the Cancer Center and check in at the registration area.   Wear comfortable clothing and clothing appropriate for easy access to any Portacath or PICC line.   We strive to give you quality time with your provider. You may need to reschedule your appointment if you arrive late (15 or more minutes).  Arriving late affects you and other patients whose appointments are after yours.  Also, if you miss three or more appointments without notifying the office, you may be dismissed from the clinic at the provider's discretion.      For prescription refill requests, have your pharmacy contact our office and allow 72 hours for refills to be completed.    Today you received the following chemotherapy and/or immunotherapy agents: Keytruda/Alimta      To help prevent nausea and vomiting after your treatment, we encourage you to take your nausea medication as directed.  BELOW ARE SYMPTOMS THAT SHOULD BE REPORTED IMMEDIATELY: *FEVER GREATER THAN 100.4 F (38 C) OR HIGHER *CHILLS OR SWEATING *NAUSEA AND VOMITING THAT IS NOT CONTROLLED WITH YOUR NAUSEA MEDICATION *UNUSUAL SHORTNESS OF BREATH *UNUSUAL BRUISING OR BLEEDING *URINARY PROBLEMS (pain or burning when urinating, or frequent urination) *BOWEL PROBLEMS (unusual diarrhea, constipation, pain near the anus) TENDERNESS IN MOUTH AND THROAT WITH OR WITHOUT PRESENCE OF ULCERS (sore throat, sores in mouth, or a toothache) UNUSUAL RASH, SWELLING OR PAIN  UNUSUAL VAGINAL DISCHARGE OR ITCHING   Items with * indicate a potential emergency and should be followed up as soon as possible or go to the Emergency Department if any problems should occur.  Please show the CHEMOTHERAPY ALERT CARD or  IMMUNOTHERAPY ALERT CARD at check-in to the Emergency Department and triage nurse.  Should you have questions after your visit or need to cancel or reschedule your appointment, please contact [facility] - A DEPT [facility]  Dept: [phone]  and follow the prompts.  Office hours are 8:00 a.m. to 4:30 p.m. Monday - Friday. Please note that voicemails left after 4:00 p.m. may not be returned until the following business day.  We are closed weekends and major holidays. You have access to a nurse at all times for urgent questions. Please call the main number to the clinic Dept: [phone] and follow the prompts.   For any non-urgent questions, you may also contact your provider using MyChart. We now offer e-Visits for anyone 6 and older to request care online for non-urgent symptoms. For details visit [email or web address].   Also download the MyChart app! Go to the app store, search "MyChart", open the app, select [place], and log in with your MyChart username and password.

## 2023-12-22 NOTE — Telephone Encounter (Signed)
 Phoned and LMOVM that I will call her back in the morning but did leave message for her.

## 2023-12-22 NOTE — Progress Notes (Signed)
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 and coordination of care plan .   Disclaimer: This note was dictated with voice recognition software. Similar sounding words can inadvertently be transcribed and may not be corrected upon review.

## 2023-12-23 NOTE — Telephone Encounter (Signed)
 FYI:  Phoned and spoke with the pt and was advised by her since she didn't hear from us  she went and seen another Dr. I advised her that I did call and left a message for her to call back but she stated she was a chemo. Advised her of the Miralax  instructions. Pt states she is no longer constipated so once again I reminded her of the Miralax  daily use and even twice a day. Pt expressed understanding

## 2023-12-23 NOTE — Telephone Encounter (Signed)
 Since this was sent, I believe the slot is already taken.  I am glad she is doing better.  Please keep appointment upcoming with me. I reviewed Hgb yesterday. Hgb stable at 8.8.   Lab Results  Component Value Date   WBC 5.5 12/22/2023   HGB 8.8 (L) 12/22/2023   HCT 25.0 (L) 12/22/2023   MCV 88.7 12/22/2023   PLT 264 12/22/2023

## 2023-12-24 ENCOUNTER — Other Ambulatory Visit: Payer: Self-pay

## 2023-12-24 ENCOUNTER — Encounter (HOSPITAL_COMMUNITY): Payer: Self-pay

## 2023-12-24 ENCOUNTER — Emergency Department (HOSPITAL_COMMUNITY)

## 2023-12-24 ENCOUNTER — Emergency Department (HOSPITAL_COMMUNITY)
Admission: EM | Admit: 2023-12-24 | Discharge: 2023-12-24 | Disposition: A | Discharge status: Home / Self Care | Attending: Emergency Medicine | Admitting: Emergency Medicine

## 2023-12-24 ENCOUNTER — Other Ambulatory Visit: Payer: Self-pay | Admitting: *Deleted

## 2023-12-24 ENCOUNTER — Ambulatory Visit (HOSPITAL_COMMUNITY): Attending: Internal Medicine

## 2023-12-24 DIAGNOSIS — Z8673 Personal history of transient ischemic attack (TIA), and cerebral infarction without residual deficits: Secondary | ICD-10-CM | POA: Diagnosis not present

## 2023-12-24 DIAGNOSIS — J45909 Unspecified asthma, uncomplicated: Secondary | ICD-10-CM | POA: Insufficient documentation

## 2023-12-24 DIAGNOSIS — Z7901 Long term (current) use of anticoagulants: Secondary | ICD-10-CM | POA: Insufficient documentation

## 2023-12-24 DIAGNOSIS — Z79890 Hormone replacement therapy (postmenopausal): Secondary | ICD-10-CM | POA: Insufficient documentation

## 2023-12-24 DIAGNOSIS — E119 Type 2 diabetes mellitus without complications: Secondary | ICD-10-CM | POA: Diagnosis not present

## 2023-12-24 DIAGNOSIS — I2510 Atherosclerotic heart disease of native coronary artery without angina pectoris: Secondary | ICD-10-CM | POA: Diagnosis not present

## 2023-12-24 DIAGNOSIS — Z79899 Other long term (current) drug therapy: Secondary | ICD-10-CM | POA: Diagnosis not present

## 2023-12-24 DIAGNOSIS — E039 Hypothyroidism, unspecified: Secondary | ICD-10-CM | POA: Diagnosis not present

## 2023-12-24 DIAGNOSIS — Z85118 Personal history of other malignant neoplasm of bronchus and lung: Secondary | ICD-10-CM | POA: Diagnosis not present

## 2023-12-24 DIAGNOSIS — R55 Syncope and collapse: Secondary | ICD-10-CM | POA: Diagnosis present

## 2023-12-24 DIAGNOSIS — Z91040 Latex allergy status: Secondary | ICD-10-CM | POA: Diagnosis not present

## 2023-12-24 DIAGNOSIS — Z7982 Long term (current) use of aspirin: Secondary | ICD-10-CM | POA: Insufficient documentation

## 2023-12-24 DIAGNOSIS — I10 Essential (primary) hypertension: Secondary | ICD-10-CM | POA: Diagnosis not present

## 2023-12-24 LAB — COMPREHENSIVE METABOLIC PANEL WITH GFR
ALT: 22 U/L (ref 0–44)
AST: 37 U/L (ref 15–41)
Albumin: 4 g/dL (ref 3.5–5.0)
Alkaline Phosphatase: 70 U/L (ref 38–126)
Anion gap: 17 — ABNORMAL HIGH (ref 5–15)
BUN: 11 mg/dL (ref 8–23)
CO2: 20 mmol/L — ABNORMAL LOW (ref 22–32)
Calcium: 9.9 mg/dL (ref 8.9–10.3)
Chloride: 105 mmol/L (ref 98–111)
Creatinine, Ser: 1.21 mg/dL — ABNORMAL HIGH (ref 0.44–1.00)
GFR, Estimated: 50 mL/min — ABNORMAL LOW (ref >60)
Glucose, Bld: 115 mg/dL — ABNORMAL HIGH (ref 70–99)
Potassium: 4.1 mmol/L (ref 3.5–5.1)
Sodium: 141 mmol/L (ref 135–145)
Total Bilirubin: 0.5 mg/dL (ref 0.0–1.2)
Total Protein: 6.6 g/dL (ref 6.5–8.1)

## 2023-12-24 LAB — CBC WITH DIFFERENTIAL/PLATELET
Abs Immature Granulocytes: 0.02 K/uL (ref 0.00–0.07)
Basophils Absolute: 0 K/uL (ref 0.0–0.1)
Basophils Relative: 0 %
Eosinophils Absolute: 0.1 K/uL (ref 0.0–0.5)
Eosinophils Relative: 1 %
HCT: 26.7 % — ABNORMAL LOW (ref 36.0–46.0)
Hemoglobin: 8.9 g/dL — ABNORMAL LOW (ref 12.0–15.0)
Immature Granulocytes: 0 %
Lymphocytes Relative: 5 %
Lymphs Abs: 0.3 K/uL — ABNORMAL LOW (ref 0.7–4.0)
MCH: 31.1 pg (ref 26.0–34.0)
MCHC: 33.3 g/dL (ref 30.0–36.0)
MCV: 93.4 fL (ref 80.0–100.0)
Monocytes Absolute: 0.3 K/uL (ref 0.1–1.0)
Monocytes Relative: 5 %
Neutro Abs: 5.9 K/uL (ref 1.7–7.7)
Neutrophils Relative %: 89 %
Platelets: 276 K/uL (ref 150–400)
RBC: 2.86 MIL/uL — ABNORMAL LOW (ref 3.87–5.11)
RDW: 15.3 % (ref 11.5–15.5)
WBC: 6.6 K/uL (ref 4.0–10.5)
nRBC: 0 % (ref 0.0–0.2)

## 2023-12-24 LAB — URINALYSIS, ROUTINE W REFLEX MICROSCOPIC
Bilirubin Urine: NEGATIVE
Glucose, UA: NEGATIVE mg/dL
Hgb urine dipstick: NEGATIVE
Ketones, ur: NEGATIVE mg/dL
Leukocytes,Ua: NEGATIVE
Nitrite: NEGATIVE
Protein, ur: NEGATIVE mg/dL
Specific Gravity, Urine: 1.005 (ref 1.005–1.030)
pH: 6 (ref 5.0–8.0)

## 2023-12-24 LAB — D-DIMER, QUANTITATIVE: D-Dimer, Quant: 0.91 ug{FEU}/mL — ABNORMAL HIGH (ref 0.00–0.50)

## 2023-12-24 MED ORDER — ONDANSETRON HCL 4 MG/2ML IJ SOLN
4.0000 mg | Freq: Once | INTRAMUSCULAR | Status: AC
  Administered 2023-12-24: 15:00:00 4 mg via INTRAVENOUS
  Filled 2023-12-24: qty 2

## 2023-12-24 MED ORDER — HYDROCODONE-ACETAMINOPHEN 5-325 MG PO TABS
1.0000 | ORAL_TABLET | Freq: Once | ORAL | Status: AC
  Administered 2023-12-24: 21:00:00 1 via ORAL
  Filled 2023-12-24: qty 1

## 2023-12-24 MED ORDER — NAPROXEN 250 MG PO TABS
500.0000 mg | ORAL_TABLET | Freq: Once | ORAL | Status: DC
  Filled 2023-12-24: qty 2

## 2023-12-24 MED ORDER — ONDANSETRON HCL 4 MG/2ML IJ SOLN
4.0000 mg | Freq: Once | INTRAMUSCULAR | Status: AC
  Administered 2023-12-24: 21:00:00 4 mg via INTRAVENOUS
  Filled 2023-12-24: qty 2

## 2023-12-24 MED ORDER — ACETAMINOPHEN 325 MG PO TABS
650.0000 mg | ORAL_TABLET | Freq: Once | ORAL | Status: AC
  Administered 2023-12-24: 15:00:00 650 mg via ORAL
  Filled 2023-12-24: qty 2

## 2023-12-24 MED ORDER — METOPROLOL TARTRATE 5 MG/5ML IV SOLN
2.5000 mg | Freq: Once | INTRAVENOUS | Status: AC
  Administered 2023-12-24: 21:00:00 2.5 mg via INTRAVENOUS
  Filled 2023-12-24: qty 5

## 2023-12-24 MED ORDER — SODIUM CHLORIDE 0.9 % IV BOLUS
1000.0000 mL | Freq: Once | INTRAVENOUS | Status: AC
  Administered 2023-12-24: 15:00:00 1000 mL via INTRAVENOUS

## 2023-12-24 MED ORDER — DILTIAZEM HCL ER BEADS 120 MG PO CP24
360.0000 mg | ORAL_CAPSULE | Freq: Once | ORAL | Status: AC
  Administered 2023-12-24: 19:00:00 360 mg via ORAL
  Filled 2023-12-24 (×2): qty 1

## 2023-12-24 MED ORDER — IOHEXOL 350 MG/ML SOLN
100.0000 mL | Freq: Once | INTRAVENOUS | Status: AC | PRN
  Administered 2023-12-24: 23:00:00 100 mL via INTRAVENOUS

## 2023-12-24 MED ADMIN — Sodium Chloride IV Soln 0.9%: 1000 mL | INTRAVENOUS | @ 19:00:00 | NDC 00338004904

## 2023-12-24 NOTE — ED Triage Notes (Signed)
 Pt has received her 6 th treatment for cancer and was taking a shower when she began to pass out.  Family member lowered patient to chair and called EMS.  Pt was hypotensive and ems gave 500ml bolus of NS and pt BP improved and pt says she fells much better now.

## 2023-12-24 NOTE — Patient Outreach (Signed)
 Transition of Care week 3  Visit Note  12/24/2023  Name: Becky [name] MRN: [id number]          DOB: December 26, 1960  Situation: Patient enrolled in [facility] 30-day program. Visit completed with patient by telephone.   Background:  Discharge Date and Diagnosis: 12/10/23, hematochezia   Past Medical History:  Diagnosis Date   Anemia    Arthritis    Asthma    Atrial fibrillation (HCC) 2018   Back pain    BV (bacterial vaginosis) 05/23/2013   Constipation 11/21/2014   Coronary artery disease    Current use of long term anticoagulation 2018   Diabetes mellitus without complication (HCC)    Dysphagia 2011   Fibroids 03/13/2016   GERD (gastroesophageal reflux disease)    Goiter 2009   Hematochezia 2011   Hematuria 05/23/2013   History of radiation therapy    Right lung, right ribs, brain- 07/08/23-07/29/23- Dr. 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 diagnosed in 11/2016 --> started on Xarelto  for anticoagulation   Pelvic pain in female 11/02/2013   Plantar fasciitis of right foot    Pre-diabetes    Sleep apnea    dont use cpap says causes sinus infection   Stroke (HCC)    04/07/23; 06/2023; 07/2023    Assessment: Patient Reported Symptoms: Cognitive Cognitive Status: No symptoms reported, Alert and oriented to person, place, and time, Able to follow simple commands, Normal speech and language skills      Neurological Neurological Review of Symptoms: No symptoms reported    HEENT HEENT Symptoms Reported: No symptoms reported      Cardiovascular Cardiovascular Symptoms Reported: No symptoms reported Other Cardiovascular Symptoms: chronic- slight swelling in feet per pt Does patient have uncontrolled  Hypertension?: No Is patient checking Blood Pressure at home?: Yes Patient's Recent BP reading at home: pt does not give reading for today Cardiovascular Management Strategies: Adequate rest, Medication therapy, Routine screening Cardiovascular Self-Management Outcome: 4 (good)  Respiratory Respiratory Symptoms Reported: Shortness of breath Other Respiratory Symptoms: dyspnea with exertion only Respiratory Management Strategies: Adequate rest, Routine screening Respiratory Self-Management Outcome: 4 (good)  Endocrine Endocrine Symptoms Reported: No symptoms reported Is patient diabetic?: Yes Is patient checking blood sugars at home?: Yes List most recent blood sugar readings, include date and time of day: states  blood sugar has been good   does not provide any readings Endocrine Self-Management Outcome: 4 (good) Endocrine Comment: reviewed carbohydrate modified diet including adequate protein  Gastrointestinal Gastrointestinal Symptoms Reported: No symptoms reported Other Gastrointestinal Symptoms: pt denies any rectal bleeding, states  hemorrhoid issue is better Additional Gastrointestinal Details: pt taking linzess , miralax  and senekot as prescribed Gastrointestinal Management Strategies: Adequate rest, Medication therapy Gastrointestinal Self-Management Outcome: 3 (uncertain) Gastrointestinal Comment: reviewed importance of not straining with bowel movements, taking stool softeners as prescribed    Genitourinary Genitourinary Symptoms Reported: No symptoms reported    Integumentary Integumentary Symptoms Reported: No symptoms reported    Musculoskeletal Musculoskelatal Symptoms Reviewed: Limited mobility, Weakness Additional Musculoskeletal Details: going to outpatient PT today, continues using walker and cane Musculoskeletal Management Strategies: Activity, Adequate rest, Routine screening Musculoskeletal Self-Management Outcome: 4 (good) Musculoskeletal Comment: reviewed  safety precautions  Psychosocial Psychosocial Symptoms Reported: No symptoms reported         There were no vitals filed for this visit. Pain Scale: 0-10 Pain Score: 0-No pain  Medications Reviewed Today     Reviewed by Aura Mliss LABOR, RN (Registered Nurse) on 12/24/23 at 1158  Med List Status: <None>   Medication Order Taking? Sig Documenting Provider Last Dose Status Informant  acetaminophen  (TYLENOL ) 500 MG tablet [id number] Yes Take 1,000 mg by mouth every 6 (six) hours as needed for mild pain (pain score 1-3). [provider]  Active Pharmacy Records, Self, Child, Multiple Informants  albuterol  (VENTOLIN  HFA) 108 (90 Base) MCG/ACT inhaler [id number] Yes Inhale 2 puffs into the lungs every 4 (four) hours as needed for wheezing or shortness of breath. Stuart Vernell Norris, PA-C  Active Pharmacy Records, Self, Child, Multiple Informants  aspirin  EC 81 MG tablet [id number] Yes Take 1 tablet (81 mg total) by mouth daily. Swallow whole. Ricky Fines, MD  Active Pharmacy Records, Self, Child, Multiple Informants  Azelastine  HCl 137 MCG/SPRAY SOLN [id number] Yes Place 1 spray into both nostrils daily. [provider]  Active Self, Child, Pharmacy Records, Multiple Informants  dexlansoprazole  (DEXILANT ) 60 MG capsule [id number] Yes Take 1 capsule (60 mg total) by mouth daily. Cindie Carlin POUR, DO  Active Self, Child, Pharmacy Records, Multiple Informants  diltiazem  (TIADYLT  ER) 360 MG 24 hr capsule [id number] Yes TAKE ONE (1) CAPSULE BY MOUTH ONCE DAILY *NEW PRESCRIPTION REQUEST* Branch, Dorn FALCON, MD  Active Pharmacy Records, Self, Child, Multiple Informants  EPINEPHrine  0.3 mg/0.3 mL IJ SOAJ injection [id number] Yes Inject 0.3 mg into the muscle once as needed for anaphylaxis. [provider]  Active Pharmacy Records, Self, Child, Multiple Informants           Med Note LESLY, RICHERD CINDERELLA Debar Nov 24, 2023  2:05 PM) Check for expiration date  fluconazole   (DIFLUCAN ) 100 MG tablet [id number] Yes Take 100 mg by mouth daily. [provider]  Active Self, Child, Pharmacy Records, Multiple Informants  folic acid  (FOLVITE ) 1 MG tablet [id number] Yes Take 1 tablet (1 mg total) by mouth daily. Davonna Siad, MD  Active Pharmacy Records, Self, Child, Multiple Informants  lidocaine -prilocaine  (EMLA ) cream [id number] Yes APPLY TOPICALLY TO AFFECTED AREAS ONCE DAILY Sherrod Sherrod, MD  Active Self, Pharmacy Records, Child, Multiple Informants  linaclotide  (LINZESS ) 145 MCG CAPS capsule [id number]  Take 1 capsule (145 mcg total) by mouth daily before breakfast. Gladis, Mary-Margaret, FNP  Active   LORazepam  (ATIVAN ) 0.5 MG tablet [id number]  1 tablet p.o. 30 minutes before MRI of the abdomen.  Repeat x 1 if needed if needed. Sherrod Sherrod, MD  Active   magnesium  oxide (MAG-OX) 400 (240 Mg) MG tablet [id number]  Take 1 tablet (400 mg total) by mouth 2 (two) times daily. Sherrod Sherrod, MD  Active   metoprolol  tartrate (LOPRESSOR ) 25 MG tablet [id number] Yes Take 0.5 tablets (12.5 mg total) by mouth 2 (two) times daily as needed.  Patient taking differently: Take 12.5 mg by mouth 2 (two) times daily as needed (heart rate). PRN, depending on heart rate, pt states per cardiologist   Miriam Norris, NP  Active Self, Child, Pharmacy Records, Multiple Informants  montelukast  (SINGULAIR ) 10 MG tablet [id number] Yes Take 10 mg by mouth at bedtime. [provider]  Active Pharmacy Records, Self, Child, Multiple Informants  NON FORMULARY [id number] Yes Pt uses a cpap nightly [provider]  Active Pharmacy Records, Self, Child, Multiple Informants  ondansetron  (  ZOFRAN ) 8 MG tablet [id number] Yes Take 1 tablet (8 mg total) by mouth every 8 (eight) hours as needed for nausea or vomiting. Lue Elsie BROCKS, MD  Active Self, Child, Pharmacy Records, Multiple Informants  polyethylene glycol (MIRALAX ) 17 g packet [id number] Yes Take 17 g by mouth 2 (two)  times daily. Patsey Lot, MD  Active   potassium chloride  SA (KLOR-CON  M) 20 MEQ tablet [id number] Yes Take 1 tablet (20 mEq total) by mouth 2 (two) times daily. Heilingoetter, Cassandra L, PA-C  Active Self, Child, Pharmacy Records, Multiple Informants  prochlorperazine  (COMPAZINE ) 10 MG tablet [id number] Yes Take 1 tablet (10 mg total) by mouth every 6 (six) hours as needed. Heilingoetter, Cassandra L, PA-C  Active Self, Child, Pharmacy Records, Multiple Informants  RESTASIS  0.05 % ophthalmic emulsion [id number] Yes Place 1 drop into both eyes daily as needed (dry eyes). [provider]  Active Pharmacy Records, Self, Child, Multiple Informants  rivaroxaban  (XARELTO ) 20 MG TABS tablet [id number] Yes Take 1 tablet (20 mg total) by mouth daily. Alvan Dorn FALCON, MD  Active Pharmacy Records, Self, Child, Multiple Informants  rosuvastatin  (CRESTOR ) 20 MG tablet [id number] Yes Take 1 tablet (20 mg total) by mouth daily. [name], FNP  Active Self, Child, Pharmacy Records, Multiple Informants  SENEXON-S 8.6-50 MG tablet [id number] Yes Take 1 tablet by mouth 2 (two) times daily.  Patient taking differently: Take 1 tablet by mouth as needed for moderate constipation. As needed, moderate constipation   Lue Elsie BROCKS, MD  Active Self, Child, Pharmacy Records, Multiple Informants  SYNTHROID  25 MCG tablet [id number] Yes Take 1 tablet (25 mcg total) by mouth daily before breakfast. [name], FNP  Active Self, Child, Pharmacy Records, Multiple Informants            Goals Addressed             This Visit's Progress    [facility] Transitions of Care (TOC) Care Plan       Problems:  Recent Hospitalization for treatment of Hematochezia - Patient is per DC Summary - is a 63 y.o. 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 Sherrod and continues chemotherapy, pt states she is drinking Ensure if she does not eat a meal, pt states she talked with dietician at [employer] about weight loss, diet  Goal:  Over the next 30 days, the patient will not experience hospital readmission  Interventions:  Transitions of Care: Doctor Visits  - discussed the importance of doctor visits  Reviewed discharge instructions with pt Call MD for: difficulty breathing, headache or visual disturbances Call MD for: extreme fatigue Call MD for: blood in stool Call MD for: persistant dizziness or light-headedness Call MD for: persistant nausea and vomiting Call MD for: severe uncontrolled pain Call MD for: temperature >100.4 Go to ED for chest pain, unrelieved dyspnea, fainting, increased blood in stool Reviewed avoiding sick persons, washing hands well, wearing a mask as needed Reviewed diet - low sodium, heart healthy, carbohydrate modified Increase activity slowly Reinforced importance of not straining with bowel movements, using stool softeners as  needed, sitz bath Reinforced safety precautions- importance of continuing outpatient PT Reviewed importance of eating small meals as pt can tolerate including adequate protein, staying well hydrated Reviewed signs/ symptoms of infection  Patient Self Care Activities:  Attend all scheduled provider appointments Call pharmacy for medication refills 3-7 days in advance of running out of medications Call provider office for new concerns or questions  Notify RN Care Manager of TOC call rescheduling needs Participate in Transition of Care Program/Attend TOC scheduled calls Take  medications as prescribed    Plan:  The care management team will reach out to the patient again over the next 5 -10 business days. The patient has been provided with contact information for the care management team and has been advised to call with any health related questions or concerns.  Patient is scheduled for TOC follow up on 01/05/24 @ 1115 am, will not outreach week of 12/22 (Christmas holiday)   will outreach week of 12/29, pt agreeable to plan          Recommendation:   PCP Follow-up Specialty provider follow-up Dr. Sherrod  Follow Up Plan:   Telephone follow-up 01/05/24 @ 1115 am, will not outreach week of 12/22 (Christmas holiday) pt verbalizes understanding of plan of care, will contact RN CM with any questions or concerns  [name] [facility], BSN RN Care Manager/ Transition of Care [place]/ [facility] Population [facility] [phone]

## 2023-12-24 NOTE — Telephone Encounter (Signed)
 Noted

## 2023-12-24 NOTE — ED Provider Notes (Signed)
 [place] EMERGENCY DEPARTMENT AT [facility] Provider Note   CSN: [id number] Arrival date & time: 12/24/23  1338     Patient presents with: Near Syncope   Becky [name] is a 63 y.o. female with a history including hypertension, hypothyroidism, asthma atrial fibrillation type 2 diabetes, GERD and CAD, history of CVA, currently undergoing cancer treatment for metastatic non-small cell lung cancer under the care of Dr. Gatha, received her last chemotherapy 2 days ago, had a syncopal event while in the shower.  She felt okay getting into the shower, started to feel lightheaded, her vision got blurry and then grayed out and she woke with her family around her.  Her daughter was actually in the bath helping her with her shower, she states that her mother was sitting in a shower chair when this occurred, she did not fall out of the shower, denies any injury.  She has had no p.o. intake this morning except for 4 ounces of an electrolyte drink.  Denies history of prior syncope, denies chest pain, shortness of breath, nausea vomiting.  She does have a mild headache which she states she gets frequently and usually resolves with Tylenol , no different today.  She has had no treatment prior to arrival.  There is no witnessed seizure-like activity, however her husband  noted some foam coming out of her mouth before she woke.  She has no complaint of symptoms at this time, except for low back pain, which has been a chronic symptom but has been worse over the past several weeks.   The history is provided by the patient and a relative (daughter and son at bedside).       Prior to Admission medications  Medication Sig Start Date End Date Taking? Authorizing Provider  acetaminophen  (TYLENOL ) 500 MG tablet Take 1,000 mg by mouth every 6 (six) hours as needed for mild pain (pain score 1-3).    [provider]  albuterol  (VENTOLIN  HFA) 108 (90 Base) MCG/ACT inhaler Inhale 2 puffs into  the lungs every 4 (four) hours as needed for wheezing or shortness of breath. 12/07/21   Stuart Vernell Norris, PA-C  aspirin  EC 81 MG tablet Take 1 tablet (81 mg total) by mouth daily. Swallow whole. 07/25/23   Ricky Fines, MD  Azelastine  HCl 137 MCG/SPRAY SOLN Place 1 spray into both nostrils daily. 10/02/23   [provider]  dexlansoprazole  (DEXILANT ) 60 MG capsule Take 1 capsule (60 mg total) by mouth daily. 10/15/23 10/14/24  Cindie Carlin POUR, DO  diltiazem  (TIADYLT  ER) 360 MG 24 hr capsule TAKE ONE (1) CAPSULE BY MOUTH ONCE DAILY *NEW PRESCRIPTION REQUEST* 08/05/23   Alvan Dorn FALCON, MD  EPINEPHrine  0.3 mg/0.3 mL IJ SOAJ injection Inject 0.3 mg into the muscle once as needed for anaphylaxis. 04/20/18   [provider]  fluconazole  (DIFLUCAN ) 100 MG tablet Take 100 mg by mouth daily. 10/06/23   [provider]  folic acid  (FOLVITE ) 1 MG tablet Take 1 tablet (1 mg total) by mouth daily. 06/18/23   Kandala, Hyndavi, MD  lidocaine -prilocaine  (EMLA ) cream APPLY TOPICALLY TO AFFECTED AREAS ONCE DAILY 09/14/23   Sherrod Sherrod, MD  linaclotide  (LINZESS ) 145 MCG CAPS capsule Take 1 capsule (145 mcg total) by mouth daily before breakfast. 12/21/23   Gladis, Mary-Margaret, FNP  LORazepam  (ATIVAN ) 0.5 MG tablet 1 tablet p.o. 30 minutes before MRI of the abdomen.  Repeat x 1 if needed if needed. 12/22/23   Sherrod Sherrod, MD  magnesium  oxide (MAG-OX) 400 (  240 Mg) MG tablet Take 1 tablet (400 mg total) by mouth 2 (two) times daily. 12/22/23   Sherrod Sherrod, MD  metoprolol  tartrate (LOPRESSOR ) 25 MG tablet Take 0.5 tablets (12.5 mg total) by mouth 2 (two) times daily as needed. Patient taking differently: Take 12.5 mg by mouth 2 (two) times daily as needed (heart rate). PRN, depending on heart rate, pt states per cardiologist 11/24/23   Miriam Norris, NP  montelukast  (SINGULAIR ) 10 MG tablet Take 10 mg by mouth at bedtime. 08/25/16   [provider]  NON FORMULARY Pt  uses a cpap nightly    [provider]  ondansetron  (ZOFRAN ) 8 MG tablet Take 1 tablet (8 mg total) by mouth every 8 (eight) hours as needed for nausea or vomiting. 11/22/23   Lue Elsie BROCKS, MD  polyethylene glycol (MIRALAX ) 17 g packet Take 17 g by mouth 2 (two) times daily. 12/18/23   Patsey Lot, MD  potassium chloride  SA (KLOR-CON  M) 20 MEQ tablet Take 1 tablet (20 mEq total) by mouth 2 (two) times daily. 11/25/23   Heilingoetter, Cassandra L, PA-C  prochlorperazine  (COMPAZINE ) 10 MG tablet Take 1 tablet (10 mg total) by mouth every 6 (six) hours as needed. 11/09/23   Heilingoetter, Cassandra L, PA-C  RESTASIS  0.05 % ophthalmic emulsion Place 1 drop into both eyes daily as needed (dry eyes). 04/24/23   [provider]  rivaroxaban  (XARELTO ) 20 MG TABS tablet Take 1 tablet (20 mg total) by mouth daily. 08/24/23   Alvan Dorn FALCON, MD  rosuvastatin  (CRESTOR ) 20 MG tablet Take 1 tablet (20 mg total) by mouth daily. 09/28/23   Gladis, Mary-Margaret, FNP  SENEXON-S 8.6-50 MG tablet Take 1 tablet by mouth 2 (two) times daily. Patient taking differently: Take 1 tablet by mouth as needed for moderate constipation. As needed, moderate constipation 11/22/23   Lue Elsie BROCKS, MD  SYNTHROID  25 MCG tablet Take 1 tablet (25 mcg total) by mouth daily before breakfast. 09/28/23   [name], FNP    Allergies: Wound dressing adhesive, Apresoline  [hydralazine ], Latex, Other, and Prednisone     Review of Systems  Constitutional:  Negative for chills and fever.  HENT:  Negative for congestion and sore throat.   Eyes: Negative.   Respiratory:  Negative for chest tightness and shortness of breath.   Cardiovascular:  Negative for chest pain, palpitations and leg swelling.  Gastrointestinal:  Negative for abdominal pain, nausea and vomiting.  Genitourinary: Negative.   Musculoskeletal:  Positive for back pain. Negative for arthralgias, joint swelling and neck pain.   Skin: Negative.  Negative for rash and wound.  Neurological:  Positive for syncope and headaches. Negative for dizziness, weakness, light-headedness and numbness.  Psychiatric/Behavioral: Negative.      Updated Vital Signs BP 109/70   Pulse 93   Temp 97.9 F (36.6 C) (Oral)   Resp 15   Wt 94.8 kg   SpO2 97%   BMI 32.73 kg/m   Physical Exam Vitals and nursing note reviewed.  Constitutional:      Appearance: She is well-developed.  HENT:     Head: Normocephalic and atraumatic.  Eyes:     Conjunctiva/sclera: Conjunctivae normal.  Cardiovascular:     Rate and Rhythm: Normal rate and regular rhythm.     Heart sounds: Normal heart sounds.  Pulmonary:     Effort: Pulmonary effort is normal.     Breath sounds: Normal breath sounds. No wheezing.  Abdominal:     General: Bowel sounds are normal.  Palpations: Abdomen is soft.     Tenderness: There is no abdominal tenderness. There is no guarding.  Musculoskeletal:        General: Normal range of motion.     Cervical back: Normal range of motion.  Skin:    General: Skin is warm and dry.  Neurological:     General: No focal deficit present.     Mental Status: She is alert and oriented to person, place, and time.  Psychiatric:        Mood and Affect: Mood normal.     (all labs ordered are listed, but only abnormal results are displayed) Labs Reviewed  COMPREHENSIVE METABOLIC PANEL WITH GFR - Abnormal; Notable for the following components:      Result Value   CO2 20 (*)    Glucose, Bld 115 (*)    Creatinine, Ser 1.21 (*)    GFR, Estimated 50 (*)    Anion gap 17 (*)    All other components within normal limits  CBC WITH DIFFERENTIAL/PLATELET - Abnormal; Notable for the following components:   RBC 2.86 (*)    Hemoglobin 8.9 (*)    HCT 26.7 (*)    Lymphs Abs 0.3 (*)    All other components within normal limits  URINALYSIS, ROUTINE W REFLEX MICROSCOPIC - Abnormal; Notable for the following components:   Color, Urine  STRAW (*)    APPearance HAZY (*)    All other components within normal limits  D-DIMER, QUANTITATIVE (NOT AT [facility]) - Abnormal; Notable for the following components:   D-Dimer, Quant 0.91 (*)    All other components within normal limits    EKG: None  Radiology: CT Angio Chest PE W and/or Wo Contrast Result Date: 12/24/2023 EXAM: CTA CHEST 12/24/2023 11:07:29 PM TECHNIQUE: CTA of the chest was performed after the administration of intravenous contrast. Multiplanar reformatted images are provided for review. MIP images are provided for review. Automated exposure control, iterative reconstruction, and/or weight based adjustment of the mA/kV was utilized to reduce the radiation dose to as low as reasonably achievable. COMPARISON: Findings are compared to 11/23/2023. CLINICAL HISTORY: Pulmonary embolism (PE) suspected, high prob. Near syncope / presyncope. Tracking code: Bo. FINDINGS: PULMONARY ARTERIES: Pulmonary arteries are adequately opacified for evaluation. The central pulmonary arteries are enlarged in keeping with changes of pulmonary arterial hypertension. No pulmonary embolism. MEDIASTINUM: Extensive left anterior descending coronary artery calcification. Mild cardiomegaly. No pericardial effusion. Left internal jugular chest port tip seen within the right atrium. There is no acute abnormality of the thoracic aorta. LYMPH NODES: No mediastinal, hilar or axillary lymphadenopathy. LUNGS AND PLEURA: There is progressive consolidation within the right upper lobe encompassing the target lesion that on prior examination in keeping with evolving post radiation changes. Post radiation fibrotic changes are again noted within the right hilum, stable. Asymmetric bronchial wall thickening within the right perihilar region in keeping with airway inflammation. No pneumothorax or pleural effusion. UPPER ABDOMEN: Cholelithiasis partially visualized. Poorly characterized 3.8 x 2.6 cm low attenuation lesion again  noted within segment 4b, indeterminate. SOFT TISSUES AND BONES: No acute bone or soft tissue abnormality. IMPRESSION: 1. No pulmonary embolism. 2. Mild cardiomegaly and central pulmonary artery enlargement, consistent with pulmonary arterial hypertension. 3. 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 4. Asymmetric bronchial wall thickening in the right perihilar region, consistent with airway inflammation. 5. Extensive left anterior descending coronary artery calcification. 6. Indeterminate 3.8  x 2.6 cm low-attenuation lesion in hepatic segment 4B, recommending further characterization with multiphasic liver MRI or CT. 7. Cholelithiasis. Electronically signed by: Dorethia Molt MD 12/24/2023 11:21 PM EST RP Workstation: [id number]   DG Lumbar Spine Complete Result Date: 12/24/2023 CLINICAL DATA:  Lower back pain after fall EXAM: LUMBAR SPINE - COMPLETE 4+ VIEW COMPARISON:  July 30, 2018 FINDINGS: No fracture or spondylolisthesis is noted. Aortic atherosclerosis. Degenerative changes are seen involving the posterior facet joints of L3-4, L4-5 and L5-S1. Disc spaces are well-maintained. IMPRESSION: Multilevel degenerative joint disease. No acute abnormality seen. Electronically Signed   By: Lynwood Landy Raddle M.D.   On: 12/24/2023 15:42   DG Chest 1 View Result Date: 12/24/2023 CLINICAL DATA:  Near syncope EXAM: CHEST  1 VIEW COMPARISON:  November 17, 2023.  November 23, 2023 FINDINGS: The cardiomediastinal silhouette. Left internal jugular Port-A-Cath unchanged. Left lung is clear. Increased right suprahilar opacity is noted consistent with right upper lobe lung mass and associated post treatment fibrosis or scar. Bony thorax is unremarkable. IMPRESSION: Increased right suprahilar opacity is noted consistent with right upper lobe lung mass and associated post treatment fibrosis  or scar. Electronically Signed   By: Lynwood Landy Raddle M.D.   On: 12/24/2023 15:09     Procedures   Medications Ordered in the ED  sodium chloride  0.9 % bolus 1,000 mL (0 mLs Intravenous Stopped 12/24/23 1630)  acetaminophen  (TYLENOL ) tablet 650 mg (650 mg Oral Given 12/24/23 1526)  ondansetron  (ZOFRAN ) injection 4 mg (4 mg Intravenous Given 12/24/23 1526)  sodium chloride  0.9 % bolus 1,000 mL (0 mLs Intravenous Stopped 12/24/23 2134)  diltiazem  (TIAZAC ) 24 hr capsule 360 mg (360 mg Oral Given 12/24/23 1912)  HYDROcodone -acetaminophen  (NORCO/VICODIN) 5-325 MG per tablet 1 tablet (1 tablet Oral Given 12/24/23 2053)  ondansetron  (ZOFRAN ) injection 4 mg (4 mg Intravenous Given 12/24/23 2118)  metoprolol  tartrate (LOPRESSOR ) injection 2.5 mg (2.5 mg Intravenous Given 12/24/23 2126)  iohexol  (OMNIPAQUE ) 350 MG/ML injection 100 mL (100 mLs Intravenous Contrast Given 12/24/23 2255)                                    Medical Decision Making Patient presenting with a syncopal event late morning while sitting in her shower, prodrome of feeling really hot, lightheaded with blurred vision suggests this was a vasovagal event.  She is 2 days out from her last chemo tx,  frequently gets hypotensive after tx per dg.  Patient was given IV fluids here, she continued to have tachycardia and is chronically in A-fib, she was given her evening diltiazem  dose which did not significantly improve her rate, after receiving a small dose of metoprolol  IV her rate did improve, but not before a D-dimer came back elevated.  Therefore CT imaging was completed to rule out PE and study was negative for PE.  She felt better at time of discharge and was motivated to go home.  She was advised close follow-up with her providers, she sees Dr. Sherrod in 5 days.  Amount and/or Complexity of Data Reviewed Labs: ordered.    Details: Patient labs reviewed,  urinalysis negative, pertinent labs including stable hemoglobin at 8.9, WBC  count 6.6, creatinine 1.21 which is stable, D-dimer 0.91. Radiology: ordered.    Details: Lumbar spine reviewed, mild degenerative joint disease.  CT angio chest negative for PE.        Final diagnoses:  Vasovagal syncope  ED Discharge Orders     None          Birdena Clarity, PA-C 12/25/23 0004

## 2023-12-24 NOTE — Therapy (Signed)
 [facility] Outpatient Rehabilitation at [facility] -  [address] [place], [place], [address] Phone: [phone]   Fax:  [phone]  Patient Details  Name: Becky [name] MRN: [id number] Date of Birth: 1960/09/28 Referring Provider:  No ref. provider found  Encounter Date: 12/24/2023   PHYSICAL THERAPY DISCHARGE SUMMARY  Visits from Start of Care: 12  Current functional level related to goals / functional outcomes: Please see last treatment note, pt unable to return due to being ER this date.   Remaining deficits: Please see last treatment note, pt unable to return due to being ER this date.   Education / Equipment: Please see last treatment note, pt unable to return due to being ER this date.   Patient agrees to discharge. Patient goals were partially met. Patient is being discharged due to not returning since the last visit.  Pts daughter was called concerning the pts missed therapy appointment this afternoon, pt is currently in ER. Pts daughter states home therapy might be better in the future, pts daughter educated on referral process. Pt to be discharged from outpatient episode of care at this time.   Lang Ada, PT, DPT [facility] Office: [phone] 3:12 PM, 12/24/2023    [facility] Outpatient Rehabilitation at [facility] [address] [place], [place], [address] Phone: [phone]   Fax:  [phone]

## 2023-12-24 NOTE — Patient Instructions (Signed)
 Visit Information  Thank you for taking time to visit with me today. Please don't hesitate to contact me if I can be of assistance to you before our next scheduled telephone appointment.  Our next appointment is by telephone on 01/05/24 @ 1115 am  Following is a copy of your care plan:   Goals Addressed             This Visit's Progress    [facility] Transitions of Care (TOC) Care Plan       Problems:  Recent Hospitalization for treatment of Hematochezia - Patient is per DC Summary - is a 63 y.o. 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, atrial fibrillation, hypertension, Diabetes, internal hemorrhoids Pt reports she still has scant amount of blood at times with bowel movements, pt states she is not straining with bowel movements, pt has GI follow up scheduled for 12/30, primary care provider 12/15, went to ED 12/11 for bleeding internal hemorroid 12/24/23- pt reports  hemorrhoid issue is much better  denies any rectal bleeding, saw oncologist 12/16 Dr. Sherrod and continues chemotherapy, pt states she is drinking Ensure if she does not eat a meal, pt states she talked with dietician at [employer] about weight loss, diet  Goal:  Over the next 30 days, the patient will not experience hospital readmission  Interventions:  Transitions of Care: Doctor Visits  - discussed the importance of doctor visits  Reviewed discharge instructions with pt Call MD for: difficulty breathing, headache or visual disturbances Call MD for: extreme fatigue Call MD for: blood in stool Call MD for: persistant dizziness or light-headedness Call MD for: persistant nausea and vomiting Call MD for: severe uncontrolled pain Call MD for: temperature >100.4 Go to ED for chest pain, unrelieved dyspnea, fainting, increased blood in stool Reviewed avoiding sick persons, washing hands well, wearing a mask as needed Reviewed diet - low sodium, heart healthy, carbohydrate  modified Increase activity slowly Reinforced importance of not straining with bowel movements, using stool softeners as needed, sitz bath Reinforced safety precautions- importance of continuing outpatient PT Reviewed importance of eating small meals as pt can tolerate including adequate protein, staying well hydrated Reviewed signs/ symptoms of infection  Patient Self Care Activities:  Attend all scheduled provider appointments Call pharmacy for medication refills 3-7 days in advance of running out of medications Call provider office for new concerns or questions  Notify RN Care Manager of TOC call rescheduling needs Participate in Transition of Care Program/Attend TOC scheduled calls Take medications as prescribed    Plan:  The care management team will reach out to the patient again over the next 5 -10 business days. The patient has been provided with contact information for the care management team and has been advised to call with any health related questions or concerns.  Patient is scheduled for TOC follow up on 01/05/24 @ 1115 am, will not outreach week of 12/22 (Christmas holiday)   will outreach week of 12/29, pt agreeable to plan          Care plan and visit instructions communicated with the patient verbally today. Patient agrees to receive a copy in MyChart. Active MyChart status and patient understanding of how to access instructions and care plan via MyChart confirmed with patient.     Telephone follow up appointment with care management team member scheduled for:  Please call the care guide team at [phone] if you need to cancel or reschedule your appointment.  Please call the Suicide and Crisis Lifeline: 988 call the [place]  National Suicide Prevention Lifeline: 435-558-3238 or TTY: 256-467-5494 TTY (249)701-3996) to talk to a trained counselor call 1-800-273-TALK (toll free, 24 hour hotline) go to [facility] [address],  [place] [phone]) call the [facility] Crisis Line: 6075139932 call 911 if you are experiencing a Mental Health or Behavioral Health Crisis or need someone to talk to.  [name] [facility], BSN RN Care Manager/ Transition of Care [place] [phone]

## 2023-12-24 NOTE — Discharge Instructions (Signed)
 Your lab tests are stable, your CT scan of your chest is also stable with no evidence for a pulmonary embolism (blood clot).  Make sure you are drinking plenty of fluids, will be important for you to stay hydrated.  Use caution, especially when in the shower like today as the hot water  can lower your blood pressure.  Plan close follow-up with your primary provider or your oncologist as needed.

## 2023-12-25 ENCOUNTER — Other Ambulatory Visit: Payer: Self-pay | Admitting: Physician Assistant

## 2023-12-25 ENCOUNTER — Telehealth: Payer: Self-pay | Admitting: Medical Oncology

## 2023-12-25 ENCOUNTER — Other Ambulatory Visit: Payer: Self-pay

## 2023-12-25 DIAGNOSIS — R059 Cough, unspecified: Secondary | ICD-10-CM

## 2023-12-25 DIAGNOSIS — C3491 Malignant neoplasm of unspecified part of right bronchus or lung: Secondary | ICD-10-CM

## 2023-12-25 MED ORDER — AZITHROMYCIN 250 MG PO TABS: ORAL_TABLET | ORAL | 0 refills | Status: DC

## 2023-12-25 MED ORDER — BENZONATATE 100 MG PO CAPS: 100.0000 mg | ORAL_CAPSULE | Freq: Two times a day (BID) | ORAL | 0 refills | Status: DC | PRN

## 2023-12-25 MED ORDER — METHYLPREDNISOLONE 4 MG PO TBPK: ORAL_TABLET | ORAL | 0 refills | Status: DC

## 2023-12-25 NOTE — Telephone Encounter (Addendum)
 Persistent Cough  I called dtr back and she said pt's  covid home test was negative.   Per Cassie ,I instructed dtr that Cassie ordered a z- pack and Tesslon for the cough and to start today. I also told her that Cassie e-scribed a medrol  dose pack ( pt confirmed she can take that ) however , I told dtr to not start it before Monday b/c she received immunotherapy Tuesday 12/16   I told dtr to call back monday with an update on symptoms.  She voiced understanding.

## 2023-12-25 NOTE — Telephone Encounter (Addendum)
 Patient was seen in the ED yesterday due to a syncopal episode at home. During the episode, the patient fell with her left leg underneath her. She reports pain at that site, with left knee pain rated 7/10. Patient also reports pain in the left shoulder and right side.  Patients daughter, Luke, reports the patient has a bruise on the right leg that is sore to touch.She asked if she can get a pain med and something for low energy and cough .   Daughter was instructed to keep pt hydrated and to administer Tylenol  500 mg, two tablets at this time, and to apply heat to the right side and left shoulder. I instructed her to get pt tested for COVID /flu and consult with pharmacist re appropriate OTC cough medication . She voiced understanding. Temperature: 99.57F temporal. ED precautions given.

## 2023-12-26 ENCOUNTER — Other Ambulatory Visit: Payer: Self-pay

## 2023-12-26 ENCOUNTER — Emergency Department (HOSPITAL_COMMUNITY)

## 2023-12-26 ENCOUNTER — Emergency Department (HOSPITAL_COMMUNITY)
Admission: EM | Admit: 2023-12-26 | Discharge: 2023-12-26 | Disposition: A | Discharge status: Home / Self Care | Attending: Emergency Medicine | Admitting: Emergency Medicine

## 2023-12-26 DIAGNOSIS — Z91040 Latex allergy status: Secondary | ICD-10-CM | POA: Insufficient documentation

## 2023-12-26 DIAGNOSIS — E860 Dehydration: Secondary | ICD-10-CM | POA: Diagnosis not present

## 2023-12-26 DIAGNOSIS — Z7901 Long term (current) use of anticoagulants: Secondary | ICD-10-CM | POA: Insufficient documentation

## 2023-12-26 DIAGNOSIS — J101 Influenza due to other identified influenza virus with other respiratory manifestations: Secondary | ICD-10-CM | POA: Diagnosis not present

## 2023-12-26 DIAGNOSIS — I10 Essential (primary) hypertension: Secondary | ICD-10-CM | POA: Diagnosis not present

## 2023-12-26 DIAGNOSIS — C3490 Malignant neoplasm of unspecified part of unspecified bronchus or lung: Secondary | ICD-10-CM | POA: Insufficient documentation

## 2023-12-26 DIAGNOSIS — Z7982 Long term (current) use of aspirin: Secondary | ICD-10-CM | POA: Diagnosis not present

## 2023-12-26 DIAGNOSIS — M25552 Pain in left hip: Secondary | ICD-10-CM | POA: Insufficient documentation

## 2023-12-26 DIAGNOSIS — Z8673 Personal history of transient ischemic attack (TIA), and cerebral infarction without residual deficits: Secondary | ICD-10-CM | POA: Diagnosis not present

## 2023-12-26 DIAGNOSIS — I2510 Atherosclerotic heart disease of native coronary artery without angina pectoris: Secondary | ICD-10-CM | POA: Diagnosis not present

## 2023-12-26 DIAGNOSIS — I4891 Unspecified atrial fibrillation: Secondary | ICD-10-CM | POA: Insufficient documentation

## 2023-12-26 DIAGNOSIS — E039 Hypothyroidism, unspecified: Secondary | ICD-10-CM | POA: Insufficient documentation

## 2023-12-26 DIAGNOSIS — J45909 Unspecified asthma, uncomplicated: Secondary | ICD-10-CM | POA: Diagnosis not present

## 2023-12-26 DIAGNOSIS — E119 Type 2 diabetes mellitus without complications: Secondary | ICD-10-CM | POA: Insufficient documentation

## 2023-12-26 DIAGNOSIS — J029 Acute pharyngitis, unspecified: Secondary | ICD-10-CM | POA: Diagnosis present

## 2023-12-26 LAB — CBC WITH DIFFERENTIAL/PLATELET
Abs Immature Granulocytes: 0.02 K/uL (ref 0.00–0.07)
Basophils Absolute: 0 K/uL (ref 0.0–0.1)
Basophils Relative: 0 %
Eosinophils Absolute: 0.1 K/uL (ref 0.0–0.5)
Eosinophils Relative: 2 %
HCT: 23.8 % — ABNORMAL LOW (ref 36.0–46.0)
Hemoglobin: 7.9 g/dL — ABNORMAL LOW (ref 12.0–15.0)
Immature Granulocytes: 1 %
Lymphocytes Relative: 5 %
Lymphs Abs: 0.2 K/uL — ABNORMAL LOW (ref 0.7–4.0)
MCH: 30.6 pg (ref 26.0–34.0)
MCHC: 33.2 g/dL (ref 30.0–36.0)
MCV: 92.2 fL (ref 80.0–100.0)
Monocytes Absolute: 0.1 K/uL (ref 0.1–1.0)
Monocytes Relative: 3 %
Neutro Abs: 3.7 K/uL (ref 1.7–7.7)
Neutrophils Relative %: 89 %
Platelets: 224 K/uL (ref 150–400)
RBC: 2.58 MIL/uL — ABNORMAL LOW (ref 3.87–5.11)
RDW: 15.7 % — ABNORMAL HIGH (ref 11.5–15.5)
WBC: 4.1 K/uL (ref 4.0–10.5)
nRBC: 0 % (ref 0.0–0.2)

## 2023-12-26 LAB — BASIC METABOLIC PANEL WITH GFR
Anion gap: 16 — ABNORMAL HIGH (ref 5–15)
BUN: 9 mg/dL (ref 8–23)
CO2: 18 mmol/L — ABNORMAL LOW (ref 22–32)
Calcium: 9.5 mg/dL (ref 8.9–10.3)
Chloride: 106 mmol/L (ref 98–111)
Creatinine, Ser: 1.15 mg/dL — ABNORMAL HIGH (ref 0.44–1.00)
GFR, Estimated: 54 mL/min — ABNORMAL LOW
Glucose, Bld: 116 mg/dL — ABNORMAL HIGH (ref 70–99)
Potassium: 3.7 mmol/L (ref 3.5–5.1)
Sodium: 139 mmol/L (ref 135–145)

## 2023-12-26 LAB — RESP PANEL BY RT-PCR (RSV, FLU A&B, COVID)  RVPGX2
Influenza A by PCR: POSITIVE — AB
Influenza B by PCR: NEGATIVE
Resp Syncytial Virus by PCR: NEGATIVE
SARS Coronavirus 2 by RT PCR: NEGATIVE

## 2023-12-26 LAB — TROPONIN T, HIGH SENSITIVITY
Troponin T High Sensitivity: 15 ng/L (ref 0–19)
Troponin T High Sensitivity: <15 ng/L (ref 0–19)

## 2023-12-26 LAB — LACTIC ACID, PLASMA
Lactic Acid, Venous: 1.7 mmol/L (ref 0.5–1.9)
Lactic Acid, Venous: 1.3 mmol/L (ref 0.5–1.9)

## 2023-12-26 LAB — PRO BRAIN NATRIURETIC PEPTIDE: Pro Brain Natriuretic Peptide: 1198 pg/mL — ABNORMAL HIGH

## 2023-12-26 MED ORDER — SODIUM CHLORIDE 0.9 % IV BOLUS
1000.0000 mL | Freq: Once | INTRAVENOUS | Status: AC
  Administered 2023-12-26: 1000 mL via INTRAVENOUS

## 2023-12-26 MED ORDER — ONDANSETRON HCL 4 MG/2ML IJ SOLN
4.0000 mg | Freq: Once | INTRAMUSCULAR | Status: AC
  Administered 2023-12-26: 4 mg via INTRAVENOUS
  Filled 2023-12-26: qty 2

## 2023-12-26 MED ORDER — OXYCODONE-ACETAMINOPHEN 5-325 MG PO TABS: 1.0000 | ORAL_TABLET | Freq: Four times a day (QID) | ORAL | 0 refills | Status: AC | PRN

## 2023-12-26 MED ORDER — MORPHINE SULFATE (PF) 4 MG/ML IV SOLN
4.0000 mg | Freq: Once | INTRAVENOUS | Status: AC
  Administered 2023-12-26: 4 mg via INTRAVENOUS
  Filled 2023-12-26: qty 1

## 2023-12-26 MED ORDER — METOPROLOL TARTRATE 5 MG/5ML IV SOLN: 5.0000 mg | Freq: Once | INTRAVENOUS | Status: DC

## 2023-12-26 MED ORDER — OSELTAMIVIR PHOSPHATE 75 MG PO CAPS: 75.0000 mg | ORAL_CAPSULE | Freq: Two times a day (BID) | ORAL | 0 refills | Status: DC

## 2023-12-26 NOTE — ED Triage Notes (Signed)
 Pt c/o ongoing muscle pain 8/10 ongoing since her last visit and also now has developed a sore throat, cough, and congestion

## 2023-12-26 NOTE — ED Notes (Signed)
 Patient transported to CT

## 2023-12-26 NOTE — ED Notes (Signed)
 ED Provider at bedside.

## 2023-12-26 NOTE — ED Provider Notes (Signed)
 " [place] EMERGENCY DEPARTMENT AT [facility] Provider Note   CSN: [id number] Arrival date & time: 12/26/23  1157     Patient presents with: Muscle Pain   Becky [name] is a 63 y.o. female.   Pt is a 63 yo female with pmhx significant for hypertension, hypothyroidism, asthma atrial fibrillation(on Xarelto ), type 2 diabetes, GERD, CAD, history of CVA, currently undergoing cancer treatment for metastatic non-small cell lung cancer under the care of Dr. Gatha, received her last chemotherapy on 12/16.  Pt came to the ED on 12/18 for a syncopal event which occurred in the shower.  She had a full work up and was d/c home.  She continues to have left hip pain and now has a sore throat and congestion.  She did take her meds this am.       Prior to Admission medications  Medication Sig Start Date End Date Taking? Authorizing Provider  oseltamivir  (TAMIFLU ) 75 MG capsule Take 1 capsule (75 mg total) by mouth every 12 (twelve) hours. 12/26/23  Yes Dean Clarity, MD  oxyCODONE -acetaminophen  (PERCOCET/ROXICET) 5-325 MG tablet Take 1 tablet by mouth every 6 (six) hours as needed for severe pain (pain score 7-10). 12/26/23  Yes Dean Clarity, MD  acetaminophen  (TYLENOL ) 500 MG tablet Take 1,000 mg by mouth every 6 (six) hours as needed for mild pain (pain score 1-3).    [provider]  albuterol  (VENTOLIN  HFA) 108 (90 Base) MCG/ACT inhaler Inhale 2 puffs into the lungs every 4 (four) hours as needed for wheezing or shortness of breath. 12/07/21   Stuart Vernell Norris, PA-C  aspirin  EC 81 MG tablet Take 1 tablet (81 mg total) by mouth daily. Swallow whole. 07/25/23   Ricky Fines, MD  Azelastine  HCl 137 MCG/SPRAY SOLN Place 1 spray into both nostrils daily. 10/02/23   [provider]  azithromycin  (ZITHROMAX  Z-PAK) 250 MG tablet Take as directed 12/25/23   Heilingoetter, Cassandra L, PA-C  benzonatate  (TESSALON ) 100 MG capsule Take 1 capsule (100 mg total) by  mouth 2 (two) times daily as needed for cough. 12/25/23   Heilingoetter, Cassandra L, PA-C  dexlansoprazole  (DEXILANT ) 60 MG capsule Take 1 capsule (60 mg total) by mouth daily. 10/15/23 10/14/24  Cindie Carlin POUR, DO  diltiazem  (TIADYLT  ER) 360 MG 24 hr capsule TAKE ONE (1) CAPSULE BY MOUTH ONCE DAILY *NEW PRESCRIPTION REQUEST* 08/05/23   Alvan Dorn FALCON, MD  EPINEPHrine  0.3 mg/0.3 mL IJ SOAJ injection Inject 0.3 mg into the muscle once as needed for anaphylaxis. 04/20/18   [provider]  fluconazole  (DIFLUCAN ) 100 MG tablet Take 100 mg by mouth daily. 10/06/23   [provider]  folic acid  (FOLVITE ) 1 MG tablet Take 1 tablet (1 mg total) by mouth daily. 06/18/23   Kandala, Hyndavi, MD  lidocaine -prilocaine  (EMLA ) cream APPLY TOPICALLY TO AFFECTED AREAS ONCE DAILY 09/14/23   Sherrod Sherrod, MD  linaclotide  (LINZESS ) 145 MCG CAPS capsule Take 1 capsule (145 mcg total) by mouth daily before breakfast. 12/21/23   Gladis, Mary-Margaret, FNP  LORazepam  (ATIVAN ) 0.5 MG tablet 1 tablet p.o. 30 minutes before MRI of the abdomen.  Repeat x 1 if needed if needed. 12/22/23   Sherrod Sherrod, MD  magnesium  oxide (MAG-OX) 400 (240 Mg) MG tablet Take 1 tablet (400 mg total) by mouth 2 (two) times daily. 12/22/23   Sherrod Sherrod, MD  methylPREDNISolone  (MEDROL  DOSEPAK) 4 MG TBPK tablet Use as instructed 12/25/23   Heilingoetter, Cassandra L, PA-C  metoprolol  tartrate (LOPRESSOR ) 25  MG tablet Take 0.5 tablets (12.5 mg total) by mouth 2 (two) times daily as needed. Patient taking differently: Take 12.5 mg by mouth 2 (two) times daily as needed (heart rate). PRN, depending on heart rate, pt states per cardiologist 11/24/23   Miriam Norris, NP  montelukast  (SINGULAIR ) 10 MG tablet Take 10 mg by mouth at bedtime. 08/25/16   [provider]  NON FORMULARY Pt uses a cpap nightly    [provider]  ondansetron  (ZOFRAN ) 8 MG tablet Take 1 tablet (8 mg total) by mouth every 8 (eight)  hours as needed for nausea or vomiting. 11/22/23   Lue Elsie BROCKS, MD  polyethylene glycol (MIRALAX ) 17 g packet Take 17 g by mouth 2 (two) times daily. 12/18/23   Patsey Lot, MD  potassium chloride  SA (KLOR-CON  M) 20 MEQ tablet Take 1 tablet (20 mEq total) by mouth 2 (two) times daily. 11/25/23   Heilingoetter, Cassandra L, PA-C  prochlorperazine  (COMPAZINE ) 10 MG tablet Take 1 tablet (10 mg total) by mouth every 6 (six) hours as needed. 11/09/23   Heilingoetter, Cassandra L, PA-C  RESTASIS  0.05 % ophthalmic emulsion Place 1 drop into both eyes daily as needed (dry eyes). 04/24/23   [provider]  rivaroxaban  (XARELTO ) 20 MG TABS tablet Take 1 tablet (20 mg total) by mouth daily. 08/24/23   Alvan Dorn FALCON, MD  rosuvastatin  (CRESTOR ) 20 MG tablet Take 1 tablet (20 mg total) by mouth daily. 09/28/23   Gladis, Mary-Margaret, FNP  SENEXON-S 8.6-50 MG tablet Take 1 tablet by mouth 2 (two) times daily. Patient taking differently: Take 1 tablet by mouth as needed for moderate constipation. As needed, moderate constipation 11/22/23   Lue Elsie BROCKS, MD  SYNTHROID  25 MCG tablet Take 1 tablet (25 mcg total) by mouth daily before breakfast. 09/28/23   [name], FNP    Allergies: Wound dressing adhesive, Apresoline  [hydralazine ], Latex, Other, and Prednisone     Review of Systems  Musculoskeletal:        Left hip pain  All other systems reviewed and are negative.   Updated Vital Signs BP 102/70   Pulse (!) 109   Temp 99.1 F (37.3 C) (Oral)   Resp 18   SpO2 100%   Physical Exam Vitals and nursing note reviewed.  Constitutional:      Appearance: Normal appearance. She is obese.  HENT:     Head: Normocephalic and atraumatic.     Right Ear: External ear normal.     Left Ear: External ear normal.     Nose: Nose normal.     Mouth/Throat:     Mouth: Mucous membranes are moist.     Pharynx: Oropharynx is clear.  Eyes:     Extraocular Movements:  Extraocular movements intact.     Conjunctiva/sclera: Conjunctivae normal.     Pupils: Pupils are equal, round, and reactive to light.  Cardiovascular:     Rate and Rhythm: Tachycardia present. Rhythm irregular.     Pulses: Normal pulses.     Heart sounds: Normal heart sounds.  Pulmonary:     Effort: Pulmonary effort is normal.     Breath sounds: Normal breath sounds.  Musculoskeletal:        General: Normal range of motion.     Cervical back: Normal range of motion and neck supple.       Legs:  Skin:    General: Skin is warm.     Capillary Refill: Capillary refill takes less than 2 seconds.  Neurological:     General: No focal deficit present.     Mental Status: She is alert and oriented to person, place, and time.  Psychiatric:        Mood and Affect: Mood normal.        Behavior: Behavior normal.     (all labs ordered are listed, but only abnormal results are displayed) Labs Reviewed  RESP PANEL BY RT-PCR (RSV, FLU A&B, COVID)  RVPGX2 - Abnormal; Notable for the following components:      Result Value   Influenza A by PCR POSITIVE (*)    All other components within normal limits  BASIC METABOLIC PANEL WITH GFR - Abnormal; Notable for the following components:   CO2 18 (*)    Glucose, Bld 116 (*)    Creatinine, Ser 1.15 (*)    GFR, Estimated 54 (*)    Anion gap 16 (*)    All other components within normal limits  PRO BRAIN NATRIURETIC PEPTIDE - Abnormal; Notable for the following components:   Pro Brain Natriuretic Peptide 1,198.0 (*)    All other components within normal limits  CBC WITH DIFFERENTIAL/PLATELET - Abnormal; Notable for the following components:   RBC 2.58 (*)    Hemoglobin 7.9 (*)    HCT 23.8 (*)    RDW 15.7 (*)    Lymphs Abs 0.2 (*)    All other components within normal limits  LACTIC ACID, PLASMA  LACTIC ACID, PLASMA  TROPONIN T, HIGH SENSITIVITY  TROPONIN T, HIGH SENSITIVITY    EKG: None  Radiology: CT Hip Left Wo Contrast Result Date:  12/26/2023 CLINICAL DATA:  Trauma to the left hip. EXAM: CT OF THE LEFT HIP WITHOUT CONTRAST TECHNIQUE: Multidetector CT imaging of the left hip was performed according to the standard protocol. Multiplanar CT image reconstructions were also generated. RADIATION DOSE REDUCTION: This exam was performed according to the departmental dose-optimization program which includes automated exposure control, adjustment of the mA and/or kV according to patient size and/or use of iterative reconstruction technique. COMPARISON:  Left hip radiograph dated 12/26/2023. FINDINGS: Bones/Joint/Cartilage No acute fracture or dislocation. The bones are well mineralized. No significant arthritic changes. No joint effusion. Ligaments Suboptimally assessed by CT. Muscles and Tendons No acute findings. Soft tissues Sigmoid diverticulosis.  No acute findings. IMPRESSION: 1. No acute fracture or dislocation. 2. Sigmoid diverticulosis. Electronically Signed   By: Vanetta Chou M.D.   On: 12/26/2023 16:13   DG Chest Port 1 View Result Date: 12/26/2023 EXAM: 1 VIEW(S) XRAY OF THE CHEST 12/26/2023 01:02:00 PM COMPARISON: 12/24/2023 CLINICAL HISTORY: sob FINDINGS: LINES, TUBES AND DEVICES: Left chest wall Port-A-Cath with tip overlying the superior cavoatrial junction. LUNGS AND PLEURA: Chronic right upper lung consolidation consistent post-treatment changes associated with known right upper lobe lung lesion. No pleural effusion. No pneumothorax. HEART AND MEDIASTINUM: No acute abnormality of the cardiac and mediastinal silhouettes. BONES AND SOFT TISSUES: No acute osseous abnormality. IMPRESSION: 1. Chronic right upper lung consolidation consistent with post-treatment changes associated with known right upper lobe lung lesion. 2. No acute findings. Electronically signed by: Waddell Calk MD 12/26/2023 01:09 PM EST RP Workstation: [id number]   DG Hip Unilat W or Wo Pelvis 2-3 Views Left Result Date: 12/26/2023 EXAM: 2 OR MORE VIEW(S)  XRAY OF THE UNILATERAL HIP 12/26/2023 01:02:00 PM COMPARISON: None available. CLINICAL HISTORY: pain FINDINGS: BONES AND JOINTS: No acute fracture. No malalignment. Mild degenerative changes within both hips with subchondral sclerosis and marginal spur formation along the superior acetabulum.  SOFT TISSUES: Vascular calcifications in pelvis. IMPRESSION: 1. No acute findings. Electronically signed by: Waddell Calk MD 12/26/2023 01:06 PM EST RP Workstation: [id number]   CT Angio Chest PE W and/or Wo Contrast Result Date: 12/24/2023 EXAM: CTA CHEST 12/24/2023 11:07:29 PM TECHNIQUE: CTA of the chest was performed after the administration of intravenous contrast. Multiplanar reformatted images are provided for review. MIP images are provided for review. Automated exposure control, iterative reconstruction, and/or weight based adjustment of the mA/kV was utilized to reduce the radiation dose to as low as reasonably achievable. COMPARISON: Findings are compared to 11/23/2023. CLINICAL HISTORY: Pulmonary embolism (PE) suspected, high prob. Near syncope / presyncope. Tracking code: Bo. FINDINGS: PULMONARY ARTERIES: Pulmonary arteries are adequately opacified for evaluation. The central pulmonary arteries are enlarged in keeping with changes of pulmonary arterial hypertension. No pulmonary embolism. MEDIASTINUM: Extensive left anterior descending coronary artery calcification. Mild cardiomegaly. No pericardial effusion. Left internal jugular chest port tip seen within the right atrium. There is no acute abnormality of the thoracic aorta. LYMPH NODES: No mediastinal, hilar or axillary lymphadenopathy. LUNGS AND PLEURA: There is progressive consolidation within the right upper lobe encompassing the target lesion that on prior examination in keeping with evolving post radiation changes. Post radiation fibrotic changes are again noted within the right hilum, stable. Asymmetric bronchial wall thickening within the right  perihilar region in keeping with airway inflammation. No pneumothorax or pleural effusion. UPPER ABDOMEN: Cholelithiasis partially visualized. Poorly characterized 3.8 x 2.6 cm low attenuation lesion again noted within segment 4b, indeterminate. SOFT TISSUES AND BONES: No acute bone or soft tissue abnormality. IMPRESSION: 1. No pulmonary embolism. 2. Mild cardiomegaly and central pulmonary artery enlargement, consistent with pulmonary arterial hypertension. 3. 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 4. Asymmetric bronchial wall thickening in the right perihilar region, consistent with airway inflammation. 5. Extensive left anterior descending coronary artery calcification. 6. Indeterminate 3.8 x 2.6 cm low-attenuation lesion in hepatic segment 4B, recommending further characterization with multiphasic liver MRI or CT. 7. Cholelithiasis. Electronically signed by: Dorethia Molt MD 12/24/2023 11:21 PM EST RP Workstation: [id number]     Procedures   Medications Ordered in the ED  morphine  (PF) 4 MG/ML injection 4 mg (has no administration in time range)  sodium chloride  0.9 % bolus 1,000 mL (0 mLs Intravenous Stopped 12/26/23 1351)  morphine  (PF) 4 MG/ML injection 4 mg (4 mg Intravenous Given 12/26/23 1247)  ondansetron  (ZOFRAN ) injection 4 mg (4 mg Intravenous Given 12/26/23 1246)  sodium chloride  0.9 % bolus 1,000 mL (0 mLs Intravenous Stopped 12/26/23 1625)                                    Medical Decision Making Amount and/or Complexity of Data Reviewed Labs: ordered. Radiology: ordered.  Risk Prescription drug management.   This patient presents to the ED for concern of pain and uri sx, this involves an extensive number of treatment options, and is a complaint that carries with it a high risk of complications and morbidity.  The differential diagnosis  includes fx, contusion, covid/flu/rsv, pna   Co morbidities that complicate the patient evaluation  hypertension, hypothyroidism, asthma atrial fibrillation(on Xarelto ), type 2 diabetes, GERD, CAD, history of CVA, currently undergoing cancer treatment for metastatic non-small cell lung cancer   Additional history obtained:  Additional history obtained from epic chart review  External records from outside source obtained and reviewed including daughter   Lab Tests:  I Ordered, and personally interpreted labs.  The pertinent results include:  cbc with hgb low at 7.9 (8.9 on 12/18); bmp with cr sl elevated at 1.15; lactic nl; trop nl; BNP elevated at 1198   Imaging Studies ordered:  I ordered imaging studies including cxr, hip  I independently visualized and interpreted imaging which showed CXR: Chronic right upper lung consolidation consistent with post-treatment  changes associated with known right upper lobe lung lesion.  2. No acute findings.  L hip: No acute findings.  CT hip:  No acute fracture or dislocation.  2. Sigmoid diverticulosis.   I agree with the radiologist interpretation   Cardiac Monitoring:  The patient was maintained on a cardiac monitor.  I personally viewed and interpreted the cardiac monitored which showed an underlying rhythm of: afib with rvr   Medicines ordered and prescription drug management:  I ordered medication including ivfs  for sx  Reevaluation of the patient after these medicines showed that the patient improved I have reviewed the patients home medicines and have made adjustments as needed   Test Considered:  ct   Critical Interventions:  ivfs   Problem List / ED Course:  Influenza A:  likely the cause of diffuse pain.  Pt is feeling much better after ivfs.  I did offer adm, but she wants to go home. Afib with RVR:  improved with IVFs L hip pain:  no fx.  Pt is d/c with pain meds.  She is able to  ambulate.   Reevaluation:  After the interventions noted above, I reevaluated the patient and found that they have :improved   Social Determinants of Health:  Lives at home   Dispostion:  After consideration of the diagnostic results and the patients response to treatment, I feel that the patent would benefit from discharge with outpatient f/u.       Final diagnoses:  Influenza A  Dehydration  Pain of left hip    ED Discharge Orders          Ordered    oseltamivir  (TAMIFLU ) 75 MG capsule  Every 12 hours        12/26/23 1603    oxyCODONE -acetaminophen  (PERCOCET/ROXICET) 5-325 MG tablet  Every 6 hours PRN        12/26/23 1622               Dean Clarity, MD 12/26/23 1627  "

## 2023-12-28 ENCOUNTER — Other Ambulatory Visit: Payer: Self-pay | Admitting: Physician Assistant

## 2023-12-29 ENCOUNTER — Ambulatory Visit (HOSPITAL_COMMUNITY)
Admission: RE | Admit: 2023-12-29 | Discharge: 2023-12-29 | Disposition: A | Discharge status: Home / Self Care | Source: Ambulatory Visit | Attending: Internal Medicine | Admitting: Internal Medicine

## 2023-12-29 ENCOUNTER — Telehealth: Payer: Self-pay | Admitting: Medical Oncology

## 2023-12-29 ENCOUNTER — Inpatient Hospital Stay

## 2023-12-29 ENCOUNTER — Other Ambulatory Visit: Payer: Self-pay | Admitting: Medical Oncology

## 2023-12-29 ENCOUNTER — Other Ambulatory Visit: Payer: Self-pay | Admitting: Physician Assistant

## 2023-12-29 DIAGNOSIS — C3491 Malignant neoplasm of unspecified part of right bronchus or lung: Secondary | ICD-10-CM

## 2023-12-29 DIAGNOSIS — D649 Anemia, unspecified: Secondary | ICD-10-CM

## 2023-12-29 DIAGNOSIS — C3490 Malignant neoplasm of unspecified part of unspecified bronchus or lung: Secondary | ICD-10-CM | POA: Insufficient documentation

## 2023-12-29 DIAGNOSIS — Z5112 Encounter for antineoplastic immunotherapy: Secondary | ICD-10-CM | POA: Diagnosis not present

## 2023-12-29 LAB — CBC WITH DIFFERENTIAL (CANCER CENTER ONLY)
Abs Immature Granulocytes: 0 K/uL (ref 0.00–0.07)
Basophils Absolute: 0 K/uL (ref 0.0–0.1)
Basophils Relative: 0 %
Eosinophils Absolute: 0 K/uL (ref 0.0–0.5)
Eosinophils Relative: 3 %
HCT: 21.6 % — ABNORMAL LOW (ref 36.0–46.0)
Hemoglobin: 7.6 g/dL — ABNORMAL LOW (ref 12.0–15.0)
Immature Granulocytes: 0 %
Lymphocytes Relative: 46 %
Lymphs Abs: 0.3 K/uL — ABNORMAL LOW (ref 0.7–4.0)
MCH: 31 pg (ref 26.0–34.0)
MCHC: 35.2 g/dL (ref 30.0–36.0)
MCV: 88.2 fL (ref 80.0–100.0)
Monocytes Absolute: 0.2 K/uL (ref 0.1–1.0)
Monocytes Relative: 25 %
Neutro Abs: 0.2 K/uL — CL (ref 1.7–7.7)
Neutrophils Relative %: 26 %
Platelet Count: 149 K/uL — ABNORMAL LOW (ref 150–400)
RBC: 2.45 MIL/uL — ABNORMAL LOW (ref 3.87–5.11)
RDW: 15.2 % (ref 11.5–15.5)
Smear Review: NORMAL
WBC Count: 0.7 K/uL — CL (ref 4.0–10.5)
nRBC: 0 % (ref 0.0–0.2)

## 2023-12-29 LAB — PREPARE RBC (CROSSMATCH)

## 2023-12-29 LAB — SAMPLE TO BLOOD BANK

## 2023-12-29 MED ORDER — GADOBUTROL 1 MMOL/ML IV SOLN
9.0000 mL | Freq: Once | INTRAVENOUS | Status: AC | PRN
  Administered 2023-12-29: 9 mL via INTRAVENOUS

## 2023-12-29 NOTE — Progress Notes (Signed)
Lab appt made

## 2023-12-29 NOTE — Telephone Encounter (Signed)
 CRITICAL VALUE STICKER  CRITICAL VALUE: WBC=0.7, ANC=0.2  HGB 7.6  RECEIVER (on-site recipient of call):Izick Gasbarro   DATE & TIME NOTIFIED: 12/29/23 @ 1545  MESSENGER (representative from lab):Jessica  MD NOTIFIED: Heilingoetter  TIME OF NOTIFICATION: 1545  RESPONSE:

## 2023-12-29 NOTE — Telephone Encounter (Signed)
 Nose bleed started today with intermittent bleeding after blowing her nose. Hgb 12/20 was 7.9  4 days ago.Dx with flu 4 days ago and started Tamilfu. Schedule message sent for lab appt today and blood transfusion tomorrow.

## 2023-12-29 NOTE — Telephone Encounter (Signed)
 Labs results reported to dtr Luke and message sent to schedule pt for blood and injections. Reviewed Neutrapenic precautions with dtr and ED precautions given -she voiced understanding.

## 2023-12-29 NOTE — Progress Notes (Signed)
 Blood transfusion ordered for friday 12/26 @ 0800.

## 2023-12-30 ENCOUNTER — Inpatient Hospital Stay

## 2023-12-30 VITALS — BP 130/94 | HR 103

## 2023-12-30 DIAGNOSIS — Z95828 Presence of other vascular implants and grafts: Secondary | ICD-10-CM

## 2023-12-30 DIAGNOSIS — Z5112 Encounter for antineoplastic immunotherapy: Secondary | ICD-10-CM | POA: Diagnosis not present

## 2023-12-30 MED ORDER — FILGRASTIM-AAFI 480 MCG/0.8ML IJ SOSY
480.0000 ug | PREFILLED_SYRINGE | Freq: Once | INTRAMUSCULAR | Status: AC
  Administered 2023-12-30: 480 ug via SUBCUTANEOUS
  Filled 2023-12-30: qty 0.8

## 2024-01-01 ENCOUNTER — Inpatient Hospital Stay

## 2024-01-01 VITALS — BP 128/66 | HR 91 | Temp 97.4°F | Resp 16

## 2024-01-01 DIAGNOSIS — D649 Anemia, unspecified: Secondary | ICD-10-CM

## 2024-01-01 DIAGNOSIS — Z95828 Presence of other vascular implants and grafts: Secondary | ICD-10-CM

## 2024-01-01 DIAGNOSIS — Z5112 Encounter for antineoplastic immunotherapy: Secondary | ICD-10-CM | POA: Diagnosis not present

## 2024-01-01 MED ORDER — FILGRASTIM-AAFI 480 MCG/0.8ML IJ SOSY
480.0000 ug | PREFILLED_SYRINGE | Freq: Once | INTRAMUSCULAR | Status: AC
  Administered 2024-01-01: 480 ug via SUBCUTANEOUS
  Filled 2024-01-01: qty 0.8

## 2024-01-01 MED ORDER — SODIUM CHLORIDE 0.9% IV SOLUTION
250.0000 mL | INTRAVENOUS | Status: DC
  Administered 2024-01-01: 250 mL via INTRAVENOUS

## 2024-01-01 MED ORDER — DIPHENHYDRAMINE HCL 25 MG PO CAPS
25.0000 mg | ORAL_CAPSULE | Freq: Once | ORAL | Status: AC
  Administered 2024-01-01: 25 mg via ORAL
  Filled 2024-01-01: qty 1

## 2024-01-01 MED ORDER — ACETAMINOPHEN 325 MG PO TABS
650.0000 mg | ORAL_TABLET | Freq: Once | ORAL | Status: AC
  Administered 2024-01-01: 650 mg via ORAL
  Filled 2024-01-01: qty 2

## 2024-01-01 NOTE — Patient Instructions (Signed)

## 2024-01-02 ENCOUNTER — Inpatient Hospital Stay

## 2024-01-02 VITALS — BP 111/75 | HR 81 | Temp 98.9°F | Resp 18

## 2024-01-02 DIAGNOSIS — Z5112 Encounter for antineoplastic immunotherapy: Secondary | ICD-10-CM | POA: Diagnosis not present

## 2024-01-02 DIAGNOSIS — Z95828 Presence of other vascular implants and grafts: Secondary | ICD-10-CM

## 2024-01-02 MED ORDER — FILGRASTIM-AAFI 480 MCG/0.8ML IJ SOSY
480.0000 ug | PREFILLED_SYRINGE | Freq: Once | INTRAMUSCULAR | Status: AC
  Administered 2024-01-02: 480 ug via SUBCUTANEOUS
  Filled 2024-01-02: qty 0.8

## 2024-01-03 ENCOUNTER — Other Ambulatory Visit: Payer: Self-pay

## 2024-01-04 ENCOUNTER — Other Ambulatory Visit: Payer: Self-pay | Admitting: Oncology

## 2024-01-04 LAB — TYPE AND SCREEN
ABO/RH(D): O POS
Antibody Screen: NEGATIVE
Unit division: 0

## 2024-01-04 LAB — BPAM RBC
Blood Product Expiration Date: 202601232359
ISSUE DATE / TIME: 202512260859
Unit Type and Rh: 5100

## 2024-01-05 ENCOUNTER — Encounter: Payer: Self-pay | Admitting: Internal Medicine

## 2024-01-05 ENCOUNTER — Other Ambulatory Visit: Payer: Self-pay

## 2024-01-05 ENCOUNTER — Ambulatory Visit: Admitting: Gastroenterology

## 2024-01-05 ENCOUNTER — Emergency Department (HOSPITAL_COMMUNITY)

## 2024-01-05 ENCOUNTER — Emergency Department (HOSPITAL_COMMUNITY)
Admission: EM | Admit: 2024-01-05 | Discharge: 2024-01-05 | Disposition: A | Discharge status: Home / Self Care | Attending: Emergency Medicine | Admitting: Emergency Medicine

## 2024-01-05 ENCOUNTER — Encounter: Payer: Self-pay | Admitting: *Deleted

## 2024-01-05 ENCOUNTER — Telehealth: Payer: Self-pay | Admitting: *Deleted

## 2024-01-05 ENCOUNTER — Encounter (HOSPITAL_COMMUNITY): Payer: Self-pay

## 2024-01-05 VITALS — BP 137/82 | HR 102 | Temp 98.6°F | Ht 67.0 in | Wt 202.4 lb

## 2024-01-05 DIAGNOSIS — Z7982 Long term (current) use of aspirin: Secondary | ICD-10-CM | POA: Diagnosis not present

## 2024-01-05 DIAGNOSIS — K648 Other hemorrhoids: Secondary | ICD-10-CM | POA: Diagnosis not present

## 2024-01-05 DIAGNOSIS — K5900 Constipation, unspecified: Secondary | ICD-10-CM | POA: Diagnosis not present

## 2024-01-05 DIAGNOSIS — Z79899 Other long term (current) drug therapy: Secondary | ICD-10-CM | POA: Diagnosis not present

## 2024-01-05 DIAGNOSIS — K625 Hemorrhage of anus and rectum: Secondary | ICD-10-CM | POA: Insufficient documentation

## 2024-01-05 DIAGNOSIS — Z91040 Latex allergy status: Secondary | ICD-10-CM | POA: Diagnosis not present

## 2024-01-05 DIAGNOSIS — R062 Wheezing: Secondary | ICD-10-CM | POA: Insufficient documentation

## 2024-01-05 DIAGNOSIS — R0602 Shortness of breath: Secondary | ICD-10-CM | POA: Insufficient documentation

## 2024-01-05 LAB — CBC WITH DIFFERENTIAL/PLATELET
Abs Immature Granulocytes: 0.7 K/uL — ABNORMAL HIGH (ref 0.00–0.07)
Basophils Absolute: 0 K/uL (ref 0.0–0.1)
Basophils Relative: 0 %
Eosinophils Absolute: 0.2 K/uL (ref 0.0–0.5)
Eosinophils Relative: 2 %
HCT: 29.2 % — ABNORMAL LOW (ref 36.0–46.0)
Hemoglobin: 9.9 g/dL — ABNORMAL LOW (ref 12.0–15.0)
Lymphocytes Relative: 8 %
Lymphs Abs: 0.8 K/uL (ref 0.7–4.0)
MCH: 31.6 pg (ref 26.0–34.0)
MCHC: 33.9 g/dL (ref 30.0–36.0)
MCV: 93.3 fL (ref 80.0–100.0)
Metamyelocytes Relative: 3 %
Monocytes Absolute: 1.3 K/uL — ABNORMAL HIGH (ref 0.1–1.0)
Monocytes Relative: 13 %
Myelocytes: 4 %
Neutro Abs: 7.1 K/uL (ref 1.7–7.7)
Neutrophils Relative %: 70 %
Platelets: 102 K/uL — ABNORMAL LOW (ref 150–400)
RBC: 3.13 MIL/uL — ABNORMAL LOW (ref 3.87–5.11)
RDW: 14.9 % (ref 11.5–15.5)
Smear Review: NORMAL
WBC: 10.1 K/uL (ref 4.0–10.5)
nRBC: 0 % (ref 0.0–0.2)

## 2024-01-05 LAB — BASIC METABOLIC PANEL WITH GFR
Anion gap: 19 — ABNORMAL HIGH (ref 5–15)
BUN: 9 mg/dL (ref 8–23)
CO2: 21 mmol/L — ABNORMAL LOW (ref 22–32)
Calcium: 10.5 mg/dL — ABNORMAL HIGH (ref 8.9–10.3)
Chloride: 104 mmol/L (ref 98–111)
Creatinine, Ser: 1.3 mg/dL — ABNORMAL HIGH (ref 0.44–1.00)
GFR, Estimated: 46 mL/min — ABNORMAL LOW
Glucose, Bld: 132 mg/dL — ABNORMAL HIGH (ref 70–99)
Potassium: 3.8 mmol/L (ref 3.5–5.1)
Sodium: 143 mmol/L (ref 135–145)

## 2024-01-05 LAB — PRO BRAIN NATRIURETIC PEPTIDE: Pro Brain Natriuretic Peptide: 680 pg/mL — ABNORMAL HIGH

## 2024-01-05 MED ORDER — METOPROLOL TARTRATE 5 MG/5ML IV SOLN
5.0000 mg | Freq: Once | INTRAVENOUS | Status: AC
  Administered 2024-01-05: 5 mg via INTRAVENOUS
  Filled 2024-01-05: qty 5

## 2024-01-05 MED ORDER — DILTIAZEM HCL 25 MG/5ML IV SOLN
20.0000 mg | Freq: Once | INTRAVENOUS | Status: AC
  Administered 2024-01-05: 20 mg via INTRAVENOUS
  Filled 2024-01-05: qty 5

## 2024-01-05 MED ORDER — HYDROCORTISONE (PERIANAL) 2.5 % EX CREA: 1.0000 | TOPICAL_CREAM | Freq: Two times a day (BID) | CUTANEOUS | 1 refills | Status: AC

## 2024-01-05 MED ORDER — IPRATROPIUM-ALBUTEROL 0.5-2.5 (3) MG/3ML IN SOLN
3.0000 mL | Freq: Once | RESPIRATORY_TRACT | Status: AC
  Administered 2024-01-05: 3 mL via RESPIRATORY_TRACT

## 2024-01-05 MED ORDER — IOHEXOL 350 MG/ML SOLN
75.0000 mL | Freq: Once | INTRAVENOUS | Status: AC | PRN
  Administered 2024-01-05: 60 mL via INTRAVENOUS

## 2024-01-05 NOTE — Patient Instructions (Signed)
 Let's continue linzess  at the lowest dose.  You can take miralax  daily as needed.  You may need to titrate this depending on how your bowel movements are doing.  I have sent in a rectal cream to use twice a day for any swelling or bleeding.  I recommend going to the emergency room so you can get to feeling better.  Will be thinking of you!   Therisa MICAEL Stager, PhD, ANP-BC [facility]

## 2024-01-05 NOTE — ED Notes (Signed)
 Patient returned from CT
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
## 2024-01-05 NOTE — ED Triage Notes (Signed)
 Pt diagnosed with flu A on 12/20 and is still feeling short of breath with exertion and PCP told her to come to ER because she heard some congestion in her lungs.

## 2024-01-05 NOTE — ED Provider Notes (Signed)
 " [facility] Provider Note   CSN: [id number] Arrival date & time: 01/05/24  1626     Patient presents with: Shortness of Breath   Becky [name] is a 63 y.o. female.    Shortness of Breath 63 year old female presenting with shortness of breath.  Patient was last seen on 12/26/2023 where she was diagnosed with flu.  Since then she states that she has been having some shortness of breath.  However it got worse starting Friday.  She has been having to use her inhaler multiple times a day.  The majority of her shortness of breath is on exertion.  While sitting still she is not having any shortness of breath.  She also is not having any chest pain.  Patient has also been diagnosed with stage IV lung cancer and is currently receiving treatments.     Prior to Admission medications  Medication Sig Start Date End Date Taking? Authorizing Provider  acetaminophen  (TYLENOL ) 500 MG tablet Take 1,000 mg by mouth every 6 (six) hours as needed for mild pain (pain score 1-3).    [provider]  albuterol  (VENTOLIN  HFA) 108 (90 Base) MCG/ACT inhaler Inhale 2 puffs into the lungs every 4 (four) hours as needed for wheezing or shortness of breath. 12/07/21   Stuart Vernell Norris, PA-C  aspirin  EC 81 MG tablet Take 1 tablet (81 mg total) by mouth daily. Swallow whole. 07/25/23   Ricky Fines, MD  Azelastine  HCl 137 MCG/SPRAY SOLN Place 1 spray into both nostrils daily. 10/02/23   [provider]  benzonatate  (TESSALON ) 100 MG capsule Take 1 capsule (100 mg total) by mouth 2 (two) times daily as needed for cough. 12/25/23   Heilingoetter, Cassandra L, PA-C  dexlansoprazole  (DEXILANT ) 60 MG capsule Take 1 capsule (60 mg total) by mouth daily. 10/15/23 10/14/24  Cindie Carlin POUR, DO  diltiazem  (TIADYLT  ER) 360 MG 24 hr capsule TAKE ONE (1) CAPSULE BY MOUTH ONCE DAILY *NEW PRESCRIPTION REQUEST* 08/05/23   Alvan Dorn FALCON, MD  EPINEPHrine  0.3  mg/0.3 mL IJ SOAJ injection Inject 0.3 mg into the muscle once as needed for anaphylaxis. 04/20/18   [provider]  fluconazole  (DIFLUCAN ) 100 MG tablet Take 100 mg by mouth daily. 10/06/23   [provider]  folic acid  (FOLVITE ) 1 MG tablet TAKE 1 TABLET BY MOUTH ONCE DAILY 01/05/24   Kandala, Hyndavi, MD  hydrocortisone  (ANUSOL -HC) 2.5 % rectal cream Place 1 Application rectally 2 (two) times daily. Per rectum for bleeding 01/05/24   Shirlean Therisa ORN, NP  lidocaine -prilocaine  (EMLA ) cream APPLY TOPICALLY TO AFFECTED AREAS ONCE DAILY 09/14/23   Sherrod Sherrod, MD  linaclotide  (LINZESS ) 72 MCG capsule Take 72 mcg by mouth daily before breakfast.    [provider]  LORazepam  (ATIVAN ) 0.5 MG tablet 1 tablet p.o. 30 minutes before MRI of the abdomen.  Repeat x 1 if needed if needed. 12/22/23   Sherrod Sherrod, MD  magnesium  oxide (MAG-OX) 400 (240 Mg) MG tablet Take 1 tablet (400 mg total) by mouth 2 (two) times daily. 12/22/23   Sherrod Sherrod, MD  methylPREDNISolone  (MEDROL  DOSEPAK) 4 MG TBPK tablet Use as instructed Patient not taking: Reported on 01/05/2024 12/25/23   Heilingoetter, Cassandra L, PA-C  metoprolol  tartrate (LOPRESSOR ) 25 MG tablet Take 0.5 tablets (12.5 mg total) by mouth 2 (two) times daily as needed. 11/24/23   Miriam Norris, NP  montelukast  (SINGULAIR ) 10 MG tablet Take 10 mg by mouth at bedtime. 08/25/16  [provider]  NON FORMULARY Pt uses a cpap nightly    [provider]  ondansetron  (ZOFRAN ) 8 MG tablet Take 1 tablet (8 mg total) by mouth every 8 (eight) hours as needed for nausea or vomiting. 11/22/23   Lue Elsie BROCKS, MD  oseltamivir  (TAMIFLU ) 75 MG capsule Take 1 capsule (75 mg total) by mouth every 12 (twelve) hours. Patient not taking: Reported on 01/05/2024 12/26/23   Dean Clarity, MD  oxyCODONE -acetaminophen  (PERCOCET/ROXICET) 5-325 MG tablet Take 1 tablet by mouth every 6 (six) hours as needed for severe pain  (pain score 7-10). 12/26/23   Haviland, Julie, MD  polyethylene glycol (MIRALAX ) 17 g packet Take 17 g by mouth 2 (two) times daily. 12/18/23   Patsey Lot, MD  potassium chloride  SA (KLOR-CON  M) 20 MEQ tablet Take 1 tablet (20 mEq total) by mouth 2 (two) times daily. 11/25/23   Heilingoetter, Cassandra L, PA-C  prochlorperazine  (COMPAZINE ) 10 MG tablet Take 1 tablet (10 mg total) by mouth every 6 (six) hours as needed. 11/09/23   Heilingoetter, Cassandra L, PA-C  RESTASIS  0.05 % ophthalmic emulsion Place 1 drop into both eyes daily as needed (dry eyes). 04/24/23   [provider]  rivaroxaban  (XARELTO ) 20 MG TABS tablet Take 1 tablet (20 mg total) by mouth daily. 08/24/23   Alvan Dorn FALCON, MD  rosuvastatin  (CRESTOR ) 20 MG tablet Take 1 tablet (20 mg total) by mouth daily. 09/28/23   Gladis, Mary-Margaret, FNP  SENEXON-S 8.6-50 MG tablet Take 1 tablet by mouth 2 (two) times daily. 11/22/23   Lue Elsie BROCKS, MD  SYNTHROID  25 MCG tablet Take 1 tablet (25 mcg total) by mouth daily before breakfast. 09/28/23   [name], FNP    Allergies: Wound dressing adhesive, Apresoline  [hydralazine ], Latex, Other, and Prednisone     Review of Systems  Respiratory:  Positive for shortness of breath.   All other systems reviewed and are negative.   Updated Vital Signs BP 127/70   Pulse 99   Temp 98.1 F (36.7 C) (Oral)   Resp 19   Wt 97.8 kg   SpO2 96%   BMI 33.77 kg/m   Physical Exam Vitals and nursing note reviewed.  HENT:     Mouth/Throat:     Pharynx: Oropharynx is clear.  Cardiovascular:     Rate and Rhythm: Normal rate.     Pulses: Normal pulses.  Pulmonary:     Effort: Pulmonary effort is normal.     Breath sounds: Examination of the right-upper field reveals wheezing. Examination of the left-upper field reveals wheezing. Examination of the right-middle field reveals wheezing. Examination of the left-middle field reveals wheezing. Examination of the  right-lower field reveals wheezing. Examination of the left-lower field reveals wheezing. Wheezing present. No decreased breath sounds, rhonchi or rales.  Abdominal:     General: Abdomen is flat. Bowel sounds are normal.     Palpations: Abdomen is soft.  Skin:    General: Skin is warm and dry.  Neurological:     General: No focal deficit present.     Mental Status: She is alert.     (all labs ordered are listed, but only abnormal results are displayed) Labs Reviewed  BASIC METABOLIC PANEL WITH GFR - Abnormal; Notable for the following components:      Result Value   CO2 21 (*)    Glucose, Bld 132 (*)    Creatinine, Ser 1.30 (*)    Calcium  10.5 (*)    GFR, Estimated 46 (*)  Anion gap 19 (*)    All other components within normal limits  PRO BRAIN NATRIURETIC PEPTIDE - Abnormal; Notable for the following components:   Pro Brain Natriuretic Peptide 680.0 (*)    All other components within normal limits  CBC WITH DIFFERENTIAL/PLATELET - Abnormal; Notable for the following components:   RBC 3.13 (*)    Hemoglobin 9.9 (*)    HCT 29.2 (*)    Platelets 102 (*)    Monocytes Absolute 1.3 (*)    Abs Immature Granulocytes 0.70 (*)    All other components within normal limits    EKG: None  Radiology: CT Angio Chest PE W and/or Wo Contrast Result Date: 01/05/2024 EXAM: CTA CHEST 01/05/2024 08:09:44 PM TECHNIQUE: CTA of the chest was performed after the administration of intravenous contrast. Multiplanar reformatted images are provided for review. MIP images are provided for review. Automated exposure control, iterative reconstruction, and/or weight based adjustment of the mA/kV was utilized to reduce the radiation dose to as low as reasonably achievable. COMPARISON: Findings are compared to 12/24/2023. CLINICAL HISTORY: shortness of breath FINDINGS: PULMONARY ARTERIES: Pulmonary arteries are adequately opacified for evaluation. The central pulmonary arteries are enlarged in keeping with  changes of pulmonary arterial hypertension. No pulmonary embolism. MEDIASTINUM: Moderate multivessel coronary artery calcification. Mild cardiomegaly. Left internal jugular chest port is partially visualized, tip within the right atrium. Mild atherosclerotic calcification within the thoracic aorta. LYMPH NODES: No mediastinal, hilar or axillary lymphadenopathy. LUNGS AND PLEURA: Similar to the prior examination, there is right perihilar / suprahilar consolidation which encompasses the target lesion noted on 11/23/2023 within the left upper lobe in keeping with post radiation changes. This appears unchanged from prior examination. No central obstructing lesion. No evidence of pleural effusion or pneumothorax. UPPER ABDOMEN: Indeterminate low attenuation lesion is again seen within segment 4 b, not well characterized on this examination. SOFT TISSUES AND BONES: Osseous structures are age appropriate. No acute bone abnormality. No lytic or blastic bone lesion. IMPRESSION: 1. No pulmonary embolism. 2. Enlarged central pulmonary arteries consistent with pulmonary arterial hypertension. 3. Right perihilar/suprahilar consolidation encompassing left upper lobe target lesion, unchanged, consistent with post-radiation changes. 4. Mild cardiomegaly. 5. Moderate multivessel coronary artery calcification. 6. Indeterminate low-attenuation lesion in hepatic segment 4B, not well characterized on this examination. Electronically signed by: Dorethia Molt MD 01/05/2024 09:38 PM EST RP Workstation: [id number]   DG Chest Portable 1 View Result Date: 01/05/2024 CLINICAL DATA:  Shortness of breath. EXAM: PORTABLE CHEST 1 VIEW COMPARISON:  Chest CT dated 12/26/2023. FINDINGS: Post treatment changes of the right upper lobe. No new consolidation. No pleural effusion pneumothorax. Stable cardiomegaly. Left pectoral Port-A-Cath. No acute osseous pathology IMPRESSION: 1. No acute cardiopulmonary process. 2. Post treatment changes of the  right upper lobe. Electronically Signed   By: Vanetta Chou M.D.   On: 01/05/2024 18:39     Procedures   Medications Ordered in the ED  ipratropium-albuterol  (DUONEB) 0.5-2.5 (3) MG/3ML nebulizer solution 3 mL (3 mLs Nebulization Given 01/05/24 1803)  diltiazem  (CARDIZEM ) injection 20 mg (20 mg Intravenous Given 01/05/24 1846)  iohexol  (OMNIPAQUE ) 350 MG/ML injection 75 mL (60 mLs Intravenous Contrast Given 01/05/24 1956)  metoprolol  tartrate (LOPRESSOR ) injection 5 mg (5 mg Intravenous Given 01/05/24 2215)    Clinical Course as of 01/05/24 2249  Tue Jan 05, 2024  1856 CT Angio Chest PE W and/or Wo Contrast [EF]    Clinical Course User Index [EF] Rosaline Almarie MATSU, PA-C  Medical Decision Making Amount and/or Complexity of Data Reviewed Labs: ordered. Radiology: ordered. Decision-making details documented in ED Course.  Risk Prescription drug management.   Impression: 64 year old female presenting with shortness of breath.  Differential diagnoses include flu, COVID, RSV, pneumonia, pneumothorax, worsening lung cancer, PE   Additional History: Patient was able to provide history.  I also reviewed other outpatient notes.  Labs: CBC showed a hemoglobin of 9.9.  No elevation in her WBC.  BMP showed a elevated creatinine and a decreased GFR.  She also has an anion gap of 19.  proBNP was 680  Imaging: Chest x-ray showed no acute findings.  CT of the chest showed no acute findings.  ED Course/Meds: 63 year old female presenting with shortness of breath.  Patient has a positive history for stage IV lung cancer.  This shortness of breath has been recent after her recent diagnosis of flu.  She reports that the majority of the time the shortness of breath comes whenever she is moving around.  After giving a DuoNeb she reports that she is able to breathe a lot better.  Patient had an elevated proBNP however this was decreased since her last visit on the  20th.  Patient also has a anion gap which she has had since then also likely due to her kidney failure.  Her chest ray showed no acute findings making pneumonia less likely.  CT showed no signs of pneumonia or PE.  I told the patient that she needs to follow-up with her pulmonologist and also her cardiologist.  Educated on signs and symptoms when to return to the ER such as chest pain or worsening shortness of breath.  Patient remained stable while in the ER and at discharge.      Final diagnoses:  None    ED Discharge Orders     None          Rosaline Almarie MATSU, [place] 01/05/24 2253  "

## 2024-01-05 NOTE — ED Notes (Signed)
"  Respiratory called for duo neb  "

## 2024-01-05 NOTE — Discharge Instructions (Signed)
 Follow-up with your pulmonologist and cardiologist.  Follow-up with your primary care if you continue to have symptoms.  Continue to stay hydrated and rest.  You are taking your medications as prescribed.  Start to develop any shortness of breath, chest pain, worsening of symptoms please return to the ER

## 2024-01-06 ENCOUNTER — Encounter: Payer: Self-pay | Admitting: Nurse Practitioner

## 2024-01-06 ENCOUNTER — Encounter: Payer: Self-pay | Admitting: *Deleted

## 2024-01-06 ENCOUNTER — Ambulatory Visit: Attending: Nurse Practitioner | Admitting: Nurse Practitioner

## 2024-01-06 VITALS — BP 132/82 | HR 124 | Ht 67.0 in | Wt 204.0 lb

## 2024-01-06 DIAGNOSIS — R55 Syncope and collapse: Secondary | ICD-10-CM | POA: Diagnosis not present

## 2024-01-06 DIAGNOSIS — E785 Hyperlipidemia, unspecified: Secondary | ICD-10-CM

## 2024-01-06 DIAGNOSIS — R5383 Other fatigue: Secondary | ICD-10-CM | POA: Diagnosis not present

## 2024-01-06 DIAGNOSIS — R110 Nausea: Secondary | ICD-10-CM

## 2024-01-06 DIAGNOSIS — R531 Weakness: Secondary | ICD-10-CM

## 2024-01-06 DIAGNOSIS — I38 Endocarditis, valve unspecified: Secondary | ICD-10-CM

## 2024-01-06 DIAGNOSIS — I480 Paroxysmal atrial fibrillation: Secondary | ICD-10-CM

## 2024-01-06 DIAGNOSIS — I4891 Unspecified atrial fibrillation: Secondary | ICD-10-CM

## 2024-01-06 DIAGNOSIS — R0609 Other forms of dyspnea: Secondary | ICD-10-CM | POA: Diagnosis not present

## 2024-01-06 DIAGNOSIS — R002 Palpitations: Secondary | ICD-10-CM | POA: Diagnosis not present

## 2024-01-06 DIAGNOSIS — I2510 Atherosclerotic heart disease of native coronary artery without angina pectoris: Secondary | ICD-10-CM

## 2024-01-06 DIAGNOSIS — R000 Tachycardia, unspecified: Secondary | ICD-10-CM | POA: Diagnosis not present

## 2024-01-06 DIAGNOSIS — I10 Essential (primary) hypertension: Secondary | ICD-10-CM | POA: Diagnosis not present

## 2024-01-06 DIAGNOSIS — Z8719 Personal history of other diseases of the digestive system: Secondary | ICD-10-CM

## 2024-01-06 DIAGNOSIS — N1831 Chronic kidney disease, stage 3a: Secondary | ICD-10-CM

## 2024-01-06 MED ORDER — METOPROLOL TARTRATE 25 MG PO TABS: 12.5000 mg | ORAL_TABLET | Freq: Two times a day (BID) | ORAL | 2 refills | Status: AC

## 2024-01-06 NOTE — Patient Instructions (Addendum)
 Medication Instructions:  Your physician has recommended you make the following change in your medication:  Stop taking Olmesartan  and Spironolactone   Restart Metoprolol  Tartrate 12.5 mg twice daily Continue taking all other medications as prescribed  Labwork: None  Testing/Procedures: None  Follow-Up: Your physician recommends that you schedule a follow-up appointment in: 1 week EKG Nurse visit and 1 month follow up  Any Other Special Instructions Will Be Listed Below (If Applicable). Please stay well hydrated and keep a log of your blood pressure and bring to follow up visit  Thank you for choosing [facility]!     If you need a refill on your cardiac medications before your next appointment, please call your pharmacy.

## 2024-01-06 NOTE — Progress Notes (Signed)
 " Cardiology Office Note:  .   Date: 01/06/2024 ID:  Becky [name], DOB 22-May-1960, MRN [id number] PCP: [name], FNP  [facility] Providers Cardiologist:  Alvan Carrier, MD Electrophysiologist:  OLE ONEIDA HOLTS, MD  PV Cardiologist:  Gordy Bergamo, MD    History of Present Illness: .   Becky [name] is a 63 y.o. female with a PMH of chest pain, PAF, HTN, HLD, hypothyroidism, shortness of breath/DOE, CVA (07/2023), OSA, cough, dizziness, hypotension, mild to moderate AI, anemia, GI bleeding, diverticulitis, CKD, lung cancer, weakness, syncope (12/2023), and leg edema/leg pains, who presents today for 4-6 week follow-up.   Last seen by Dr. Carrier Alvan on March 25, 2022.  At that time, she noticed some increased symptoms of A-fib during some previous extended issues with respiratory tract infections, palpitations were resolving.  At that time, she noted some recurrent GI bleeding and was instructed to hold Xarelto  for 3 days and resume if her issues resolved.  At that time, she had a GI appointment the following week.  Recommended to that may need to reconsider Watchman, she has started evaluation.  ABIs were ordered due to her reported leg pains.  ED visit on April 05, 2023 for rectal bleeding. Fecal occult was positive.  H&H: 11.1/33.9.  Was discharged from ED with GI follow-up.  She was off her Xarelto  for 3 days as instructed.  Hospitalized early April 2025 due to complaints of numbness along right hand, numbness along left toes, also noted intermittent episodes of dizziness, headache, and blurred vision.  She also continues to note bloody stools.  No melena noted.  Xarelto  was held and was noted the last time she took medication was on March 27, 2023.  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  Noted to have nonbleeding internal  hemorrhoids, some diverticulosis, recommended to have repeat colonoscopy in 5 years and remain on MiraLAX  daily.  Per GI, she was okay to resume OAC.  ED visit April 13, 2023 for dizziness.  Stated the symptoms felt similar before previous hospitalization when diagnosed with a stroke.  Reported being compliant with Xarelto .  Noted intermittent blurry vision and dizziness.  CT of the head was negative for anything acute.  Blood work was unremarkable.  Contacted our office on April 18 noting intermittent lightheadedness, noting labile heart rates, self decreased metoprolol  tartrate to 25 mg twice daily, seemed to help her blood pressure and heart rate from dropping so much.  Denied syncope. Laymon Qua, PA-C recommended to further reduce Lopressor  to 12.5 mg BID.   05/06/2023 - Today she presents for scheduled follow-up.  She states she is doing better than previously.  Tells me she had 3 days of some bleeding noticed after her colonoscopy, then the bleeding stopped.  She denies any recent recurrent GI bleeding.  Admits to some lower SBP readings, confirms SBP ranging from 95- 118.  Notices heart rate dropping at nighttime, 40's at times even after lowering Lopressor  to 12.5 mg BID, HR on average is between 53-63 bpm.  She is compliant with her CPAP usage.  Admits to some lightheadedness and dizziness triggered by lights and stimuli from her phone.  Says she has been diagnosed with vertigo in the past, has tried medicine for vertigo previously.  Currently taking olmesartan  20 mg daily. Denies any chest pain, shortness of breath, palpitations, syncope, presyncope, orthopnea, PND, swelling or significant  weight changes, or claudication.  06/22/2023 -  Here for follow-up. BP readings are labile per her report. She tells me of her new diagnosis of lung cancer, coping well. Will be seeing her oncologist on June 30, 2023. Does admit to some nausea with her chemotherapy. Does admit to intermittent dizziness, some  improved compared to last office visit. Continues to note lightheadedness. Denies any chest pain, shortness of breath, palpitations, syncope, presyncope, orthopnea, PND, swelling or significant weight changes, acute bleeding, or claudication.  Hospitalized July 2025 d/t acute CVA. MRI of brain showed acute/subacute infarct likely of embolic etiology throughout bilateral hemispheres and left cerebellum. Unfortunately, she was off Xarelto  for 2-3 days prior to port insertion and was thought might have contributed. Oncologist on call did not consider this failure of Xarelto  since she had only been off for a little while.   Recently hospitalized d/t acute sepsis r/t diverticulits with contained performatin, tx with ABX and d/c on ABX. Recent suspected viral URI. Returned with recurrent fever without source. EKG showed rate controlled A-fib.   09/22/2023 - Here for follow-up. She admits to feeling tired and intermittent lightheadedness, now taking 40 mg of olmesartan  daily when she was previously taking 20 mg of olmesartan  daily. Denies any chest pain, shortness of breath, palpitations, syncope, presyncope, dizziness, orthopnea, PND, swelling or significant weight changes, acute bleeding, or claudication.  10/27/2023 - Here for follow-up with her daughter.  Admits to lower BP readings with SBP dropping to 76, 83 at times.  Patient feels more tired since last office visit. Denies any chest pain, shortness of breath, palpitations, syncope, presyncope, dizziness, orthopnea, PND, swelling or significant weight changes, acute bleeding, or claudication. She has also noticed coughing up some phlegm, denies any fever/nausea/vomiting/chills.  11/24/2023 - Here for follow-up. Admits to some weakness, poor appetite d/t the chemo. Admits to occasional, intermittent sharp chest pains along her right lower chest, brief in duration, denies any specific triggers. Hasn't taken her medications prior to coming to office visit.  Most recent BP at home was 139/97 per daughter's report. Admits to DOE with walking. Admits to labile HR's, Occasional sensation of fast hear rates/palpitations noticed over the past several weeks. Denies any syncope, presyncope, dizziness, orthopnea, PND, swelling or significant weight changes, acute bleeding, or claudication.  Hospitalized in early December 2024 due to hematochezia and lower GI bleed.  Fortunately, bleeding stopped and colonoscopy revealed diverticulosis and internal hemorrhoids with no stigmata of bleeding.  Had a subsequent ED visit on December 17, 2023 for rectal bleeding, her hemoglobin was stable and improved.  Did not have any active bleeding in the emergency department and was recommended to follow-up with GI.  ED visit on December 24, 2023 d/t vasovagal syncope episode while in the shower.  Felt okay getting into the shower, started feel lightheaded, got blurry vision and passed out and was woken up by family member.  Was dehydrated prior to event, she was 2 days out from receiving chemo and was noted to have frequently having hypotensive episodes after treatment per daughter's report.  No prior hx of syncope. Patient continued to have tachycardia and patient was given evening diltiazem  dose that did not improve heart rate and did receive IV metoprolol  that helped improve.  Due to elevated D-dimer, had a CT scan that was negative for PE.  She returned to the ED 2 days (12/26/2023) later for muscle pain and noted to have left hip pain, patient also noted sore throat and congestion, she tested positive for  Influenza A. ProBNP 1,198.  Chest x-ray was negative for anything acute.  X-ray of her hip was negative for anything acute.  She was given IV fluids.  Troponins negative. Hgb found to be low. Was found to be A-fib with RVR on monitor, and this improved with IVF's per documentation.   Most recent ED visit yesterday for shortness of breath, particularly noted with exertion.  Noted to  be using her inhaler multiple times a day.  Denied any chest pain.  Chest x-ray was negative for anything acute.  proBNP 680.  She received breathing treatment in ED also received diltiazem  and Lopressor  IV for her A-fib with RVR.  CT chest was negative for PE-see full report below.  01/06/2024 - She is here for follow-up with her son who updates me on past ED visits and presents with multiple symptoms: weakness, fatigue, poor appetite, nausea, occasional palpitations/tachycardia, and DOE. Denies any chest pain. Denies any recurrent syncope, orthopnea, PND, swelling or significant weight changes, acute bleeding, or claudication. Has since returned to taking Xarelto  and denies any recurrent bleeding issues. Currently not on any BP medications and son states received first dose of Metoprolol  in ED yesterday - wants to know if she should return to take this. Son states she overall stays well hydrated but typically notes lower BP readings after chemo tx. Presents HR/BP log from home that shows overall well controlled SBP readings and some occasionally higher DBP readings from 87-103 range. HR ranging from 88-131 with average HR around 99-103.   ROS: Negative. See HPI.   Studies Reviewed: SABRA    EKG:  EKG Interpretation Date/Time:  Wednesday January 06 2024 14:29:55 EST Ventricular Rate:  124 PR Interval:    QRS Duration:  90 QT Interval:  346 QTC Calculation: 497 R Axis:   86  Text Interpretation: Atrial fibrillation with rapid ventricular response When compared with ECG of 05-Jan-2024 18:21, PREVIOUS ECG IS PRESENT Confirmed by Miriam Norris [phone] on 01/06/2024 2:33:27 PM   Cardiac monitor (preliminary report) 12/2023:  Patch Wear Time:  6 days and 20 hours (2025-11-18T12:05:49-0500 to 2025-11-25T08:39:59-0500)   Atrial Fibrillation occurred continuously (100% burden), ranging from 64-204 bpm (avg of 104 bpm). Isolated VEs were rare (<1.0%), VE Couplets were rare (<1.0%), and no VE Triplets  were present. Ventricular Bigeminy was present. MD notification criteria  for Rapid Atrial Fibrillation met - report posted prior to notification (MD).  Echo bubble study limited 07/2023:   1. Left ventricular ejection fraction, by estimation, is 55 to 60%. The  left ventricle has normal function. The left ventricle has no regional  wall motion abnormalities.   2. Right ventricular systolic function is normal. The right ventricular  size is normal.   3. The mitral valve is normal in structure. No evidence of mitral valve  regurgitation. No evidence of mitral stenosis.   4. The aortic valve was not well visualized. Aortic valve regurgitation  is moderate. No aortic stenosis is present.   5. Agitated saline contrast bubble study was negative, with no evidence  of any interatrial shunt.   6. Limited echo   Echo complete bubble study 07/2023:   1. Left ventricular ejection fraction, by estimation, is 55 to 60%. The  left ventricle has normal function. The left ventricle has no regional  wall motion abnormalities. There is mild left ventricular hypertrophy.  Left ventricular diastolic parameters  are indeterminate.   2. Right ventricular systolic function was not well visualized. The right  ventricular size is not  well visualized.   3. The mitral valve is normal in structure. No evidence of mitral valve  regurgitation. No evidence of mitral stenosis.   4. Cannot rule out mobile echodensity on the non coronary cusp. The  aortic valve is tricuspid. There is moderate calcification of the aortic  valve. Aortic valve regurgitation is moderate. Aortic valve sclerosis is  present, with no evidence of aortic  valve stenosis.   5. The inferior vena cava is dilated in size with >50% respiratory  variability, suggesting right atrial pressure of 8 mmHg.   6. Agitated saline contrast bubble study was negative, with no evidence  of any interatrial shunt.   Arterial duplex 06/2023:  Summary:  Right:  Atherosclerotic plaque noted throughout the left lower extremity  with dampened monphasic doppler waveforms starting at the level of the  popliteal artery. May suggest hemodynamically significant stenosis at the  popliteal level.  Right ABI mildly reduced at 0.75   Left: Left: Atherosclerotic plaque noted throughout the left lower  extremity with dampened monphasic doppler waveforms starting at the level  of the popliteal artery. May suggest hemodynamically significant stenosis  at the popliteal artery level.  Left ABI moderately reduced at 0.55.     See table(s) above for measurements and observations.   Suggest follow up study in 12 months.  Suggest Peripheral Vascular Consult.   ABI's 06/2023:  Summary:  Right: Resting right ankle-brachial index indicates moderate right lower  extremity arterial disease. The right toe-brachial index is abnormal.   Left: Resting left ankle-brachial index indicates moderate left lower  extremity arterial disease. The left toe-brachial index is abnormal.    *See table(s) above for measurements and observations.    Suggest follow up study in 12 months.   ABI's 04/28/2023:  Summary:  Right: Resting right ankle-brachial index indicates moderate right lower  extremity arterial disease. The right toe-brachial index is abnormal.   Left: Resting left ankle-brachial index indicates moderate left lower  extremity arterial disease. The left toe-brachial index is abnormal.    *See table(s) above for measurements and observations.    Suggest follow up study in 12 months.  Echo 04/2023:  1. Left ventricular ejection fraction, by estimation, is 60 to 65%. The  left ventricle has normal function. The left ventricle has no regional  wall motion abnormalities. Left ventricular diastolic parameters are  consistent with Grade II diastolic  dysfunction (pseudonormalization). Elevated left atrial pressure.   2. Right ventricular systolic function is normal.  The right ventricular  size is normal. Tricuspid regurgitation signal is inadequate for assessing  PA pressure.   3. Left atrial size was mildly dilated.   4. The mitral valve is normal in structure. Trivial mitral valve  regurgitation. No evidence of mitral stenosis.   5. The aortic valve is tricuspid. Aortic valve regurgitation is mild to  moderate. No aortic stenosis is present.   6. The inferior vena cava is dilated in size with >50% respiratory  variability, suggesting right atrial pressure of 8 mmHg.  CT cardiac 03/2021:  IMPRESSION: 1. The left atrial appendage is a large chicken wing morphology without thrombus.   2. A 27 mm Watchman FLX device is recommended based on the above landing zone measurements (20.0 mm maximum diameter; 26% compression).   3. There is no thrombus in the left atrial appendage.   4. A mid/mid IAS puncture site is recommended.   5. Optimal deployment angle: RAO 1 CRA 23   6. Normal coronary origin. Right dominance. CAC  score of 148, which is 94th percentile for age-, sex-, and race-matched controls.  LHC 07/2019:    Prox RCA to Mid RCA lesion is 25% stenosed. Prox LAD to Mid LAD lesion is 25% stenosed. The left ventricular systolic function is normal. The left ventricular ejection fraction is 55-65% by visual estimate. LV end diastolic pressure is normal. LVEDP 15 mm Hg. There is no aortic valve stenosis.   Continue medical therapy.     Of note, patient was in AFib during the cath.  Rate was controlled and she did not feel palpitations.    Physical Exam:   VS:  BP 132/82 (BP Location: Right Arm)   Pulse (!) 124   Ht 5' 7 (1.702 m)   Wt 204 lb (92.5 kg)   SpO2 98%   BMI 31.95 kg/m    Wt Readings from Last 3 Encounters:  01/06/24 204 lb (92.5 kg)  01/05/24 215 lb 10 oz (97.8 kg)  01/05/24 202 lb 6.4 oz (91.8 kg)    GEN: Obese, 62 y.o. female in no acute distress, appears fatigued and weak, sitting in wheelchair NECK: No JVD; No  carotid bruits CARDIAC: S1/S2, fast rate and irregular rhythm, no murmurs, rubs, gallops RESPIRATORY:  Clear to auscultation without rales, wheezing or rhonchi  ABDOMEN: Soft, non-tender, non-distended EXTREMITIES:  No edema; No deformity   ASSESSMENT AND PLAN: .    Syncope, mild CAD Etiology felt to be vasovagal in etiology. Possibly r/t hypotension. Most recent BP readings are WNL. Unable to obtain orthostatics at this time. See most recent cardiac testing noted above. Heart cath in 2021 showed mild CAD.  Most recent limited echo from a few months ago showed normal LVEF with negative bubble study.  Discussed adequate hydration, compression stockings, and change positions slowly.  Will work on lifestyle changes and no indication at this time to begin midodrine. No medication changes besides what is noted below. Care and ED precautions discussed.   A-fib with RVR, palpitations/tachycardia, DOE She more than likely has chronic, permanent A-fib. Does admit to some tachycardia/palpitations and DOE. DOE etiology is multifactorial - possibly her A-fib is partly contributing. HR is fast today, see EKG above. Case reviewed and d/w DOD today in office, Dr. Debera. Will restart Lopressor  12.5 mg BID and encouraged her to stay well hydrated. She will return in 1 week to repeat an EKG. Family will provide update on her BP and HR readings. No more GI bleeding per her report. Continue diltiazem . Continue Xarelto  for stroke prevention, may need to consider stopping this in future if she has any more recurrent bleeding issues. On appropriate dosage. Heart healthy diet and regular cardiovascular exercise encouraged.   HTN Hx of BP dropping. BP stable today and has only taken diltiazem  today. Will start Lopressor  12.5 mg BID. No other med changes at this time. Discussed to monitor BP at home at least 2 hours after medications and sitting for 5-10 minutes. Heart healthy diet and regular cardiovascular exercise  encouraged.   HLD Most recent LDL 93. Continue rosuvastatin . Heart healthy diet and regular cardiovascular exercise encouraged.   Valvular insufficiency Mod AR noted on most recent Echo, no interatrial shunting noted on bubble study. Denies any symptoms. Plan to update Echo in 2 years or sooner if clinically indicated.   CKD stage 3a Most recent labs show stable kidney disease. Avoid nephrotoxic agents. Continue current medication regimen. Continue to follow with PCP.  7. Weakness, fatigue, nausea Etiology multifactorial, getting chemo tx. continue current medication  regimen.  Continue to follow with PCP.   8. Hx of GI bleeding Denies any recurrent GI bleeding since restarting blood thinner.  No medication changes at this time.  If she were to have any recurrent bleeding issues we may need to discuss discontinuing this medication.  I spent a total duration of 50 minutes reviewing prior notes, reviewing outside records including  labs, EKG today, consulting DOE in the office today, face-to-face counseling of medical condition, pathophysiology, evaluation, management, and documenting the findings in the note.    Dispo: Follow-up with Dr. Dorn Ross or APP in 1 month or sooner if anything changes.   Signed, Almarie Crate, NP   "

## 2024-01-08 ENCOUNTER — Other Ambulatory Visit: Payer: Self-pay

## 2024-01-08 NOTE — Progress Notes (Signed)
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 are adequately opacified for evaluation. The central pulmonary arteries are enlarged in keeping with changes of pulmonary arterial hypertension. No pulmonary embolism. MEDIASTINUM: Extensive left anterior descending coronary artery calcification. Mild cardiomegaly. No pericardial effusion. Left internal jugular chest port tip seen within the right atrium. There is no acute abnormality of the thoracic aorta. LYMPH NODES: No mediastinal, hilar or axillary lymphadenopathy. LUNGS AND PLEURA: There is progressive consolidation within the right upper lobe encompassing the target lesion that on prior examination in keeping with evolving post radiation changes. Post radiation fibrotic changes are again noted within the right hilum, stable. Asymmetric bronchial wall thickening within the right perihilar region in keeping with airway inflammation. No pneumothorax or pleural effusion. UPPER ABDOMEN: Cholelithiasis partially visualized. Poorly characterized 3.8 x 2.6 cm low attenuation lesion again noted within segment 4b, indeterminate. SOFT TISSUES AND BONES: No acute bone or soft tissue abnormality. IMPRESSION: 1. No pulmonary  embolism. 2. Mild cardiomegaly and central pulmonary artery enlargement, consistent with pulmonary arterial hypertension. 3. 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 4. Asymmetric bronchial wall thickening in the right perihilar region, consistent with airway inflammation. 5. Extensive left anterior descending coronary artery calcification. 6. Indeterminate 3.8 x 2.6 cm low-attenuation lesion in hepatic segment 4B, recommending further characterization with multiphasic liver MRI or CT. 7. Cholelithiasis. Electronically signed by: Dorethia Molt MD 12/24/2023 11:21 PM EST RP Workstation: [id number]   DG Lumbar Spine Complete Result Date: 12/24/2023 CLINICAL DATA:  Lower back pain after  fall EXAM: LUMBAR SPINE - COMPLETE 4+ VIEW COMPARISON:  July 30, 2018 FINDINGS: No fracture or spondylolisthesis is noted. Aortic atherosclerosis. Degenerative changes are seen involving the posterior facet joints of L3-4, L4-5 and L5-S1. Disc spaces are well-maintained. IMPRESSION: Multilevel degenerative joint disease. No acute abnormality seen. Electronically Signed   By: Lynwood Landy Raddle M.D.   On: 12/24/2023 15:42   DG Chest 1 View Result Date: 12/24/2023 CLINICAL DATA:  Near syncope EXAM: CHEST  1 VIEW COMPARISON:  November 17, 2023.  November 23, 2023 FINDINGS: The cardiomediastinal silhouette. Left internal jugular Port-A-Cath unchanged. Left lung is clear. Increased right suprahilar opacity is noted consistent with right upper lobe lung mass and associated post treatment fibrosis or scar. Bony thorax is unremarkable. IMPRESSION: Increased right suprahilar opacity is noted consistent with right upper lobe lung mass and associated post treatment fibrosis or scar. Electronically Signed   By: Lynwood Landy Raddle M.D.   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 This was diagnosed and May 2025. Her molecular studies show TMB high and MET over-expression which can be used in the second line setting. Her PDL1 expression is 0%   The original plan was to undergo concurrent chemoradiation with weekly carboplatin  for an AUC of 2 and paclitaxel 45 mg/m the first dose was expected on 07/13/2023 but the patient presented to the emergency room with a CVA and was found to have metastatic disease to the brain.   She completed radiation to the right rib, right lung bronchus, and brain under the care of Dr. Shannon on 07/29/23.    She was supposed to undergo cycle #1 on 08/10/23 but was found to have thrombocytopenia. 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 The most recent dose on 08/19/23    She started palliative systemic chemotherapy with carboplatin  for AUC of 5, Alimta  500 mg/m, Keytruda  200 mg IV every 3 weeks on 08/31/2023.  She was hospitalized following cycle #1 for nausea and vomiting as well as diverticulitis.   Starting from cycle #2 her dose was reduced to carboplatin  for an AUC of 4 and Alimta  400 mg/m.  She is status post 5 cycles total.  Starting from cycle #5 she started maintenance dose reduced Alimta  400 mg/m, and Keytruda  200 mg.  Despite this she still continued to have cytopenias requiring supportive care.  The patient was seen with Dr. Sherrod today.  Dr. Sherrod personally and independently reviewed the scan and discussed results with the patient today.  The scan showed no evidence of disease progression.    The patient has intolerance to chemotherapy even despite dose  reduction.  She required blood transfusion and G-CSF injections in the interval.  She is having concerns with quality of life.  Therefore we will discontinue Alimta  from her care plan.  She will continue on single agent immunotherapy with Keytruda  IV every 3 weeks.  I rediscussed the adverse side effects of Keytruda  and the mechanism of action.  If she continues to have worsening anemia despite no longer being on chemotherapy, then I would suspect there could be some blood loss. She  is on blood thinner. She will continue taking iron.   Patient has a history of metastatic disease to the brain.  She is not currently following with radiation oncology.  I will arrange for a repeat surveillance brain MRI given her significant fatigue, weakness, and persistent nausea and vomiting.  Contact her PCP for refill of her inhaler.  She also will call pulmonary medicine to make her follow-up visit for her COPD.  We discussed appetite stimulants.  She is not a good candidate for Megace given her history of blood clot.  Marinol would likely worsen her fatigue.  We discussed starting her on low-dose Remeron  7.5 mg p.o. at night.  I sent this to her pharmacy.  He can still receive her B12 injection today.  I would also continue her Compazine  for now since she is nauseous today.  We discussed her antiemetics with Compazine  and Zofran  and alternating them at home if needed.  I have standing orders in for sample blood bank should she require blood transfusion.  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   She previously saw member the nutritionist team.  She will continue with PT.   I have sent nystatin  to the pharmacy for oral thrush  See her back for labs and a follow-up visit in 3 weeks before undergoing cycle #7.  The patient was advised to call immediately if she has any concerning symptoms in the interval. The patient  voices understanding of current disease status and treatment options and is in agreement with the current care plan. All questions were answered. The patient knows to call the clinic with any problems, questions or concerns. We can certainly see the patient much sooner if necessary  No orders of the defined types were placed in this encounter.    Leonor Darnell L Karielle Davidow, PA-C 01/08/2024  ADDENDUM: Hematology/Oncology Attending: I had a face-to-face encounter with the patient today.  I reviewed her records, lab and recommended her care plan.  This is a 64 years old African-American female with a stage IV non-small cell lung cancer, adenocarcinoma diagnosed in May 2025 with high TMB and high MET overexpression with negative PD-L1 expression.  The patient started palliative systemic chemoimmunotherapy initially with carboplatin , Alimta  and Keytruda  every 3 weeks for 4 cycles and starting from cycle #2 she was on reduced dose of carboplatin  for AUC of 4 and Alimta  400 mg/M2.  She is status post 5 cycles.  The last cycle was given with maintenance Alimta  and Keytruda .  She continues to have significant pancytopenia and fatigue with the treatment. I recommended for her to continue her treatment with only single agent Keytruda  at this point.  We will discontinue Alimta  because of the persistent pancytopenia even after discontinuation of the carboplatin . We will see the patient back for follow-up visit in 3 weeks for evaluation before the next cycle of her treatment. She had repeat CT scan of the chest performed recently that showed no clear evidence for disease progression. The patient was advised to call immediately if she has any other concerning symptoms in the interval. Disclaimer: This note was dictated with voice recognition software. Similar sounding words can inadvertently be transcribed and may be missed upon review. Sherrod MARLA Sherrod, MD

## 2024-01-09 ENCOUNTER — Other Ambulatory Visit: Payer: Self-pay | Admitting: Physician Assistant

## 2024-01-09 DIAGNOSIS — E876 Hypokalemia: Secondary | ICD-10-CM

## 2024-01-10 ENCOUNTER — Other Ambulatory Visit: Payer: Self-pay

## 2024-01-11 ENCOUNTER — Inpatient Hospital Stay (HOSPITAL_BASED_OUTPATIENT_CLINIC_OR_DEPARTMENT_OTHER): Admitting: Physician Assistant

## 2024-01-11 ENCOUNTER — Inpatient Hospital Stay: Attending: Internal Medicine

## 2024-01-11 ENCOUNTER — Inpatient Hospital Stay

## 2024-01-11 ENCOUNTER — Other Ambulatory Visit: Payer: Self-pay | Admitting: Physician Assistant

## 2024-01-11 VITALS — BP 129/78 | HR 81 | Temp 98.0°F | Resp 17 | Ht 67.0 in | Wt 199.0 lb

## 2024-01-11 VITALS — BP 129/78 | HR 73 | Temp 98.4°F | Resp 20

## 2024-01-11 DIAGNOSIS — C7931 Secondary malignant neoplasm of brain: Secondary | ICD-10-CM | POA: Diagnosis not present

## 2024-01-11 DIAGNOSIS — R5383 Other fatigue: Secondary | ICD-10-CM | POA: Insufficient documentation

## 2024-01-11 DIAGNOSIS — C7951 Secondary malignant neoplasm of bone: Secondary | ICD-10-CM | POA: Insufficient documentation

## 2024-01-11 DIAGNOSIS — Z7901 Long term (current) use of anticoagulants: Secondary | ICD-10-CM | POA: Diagnosis not present

## 2024-01-11 DIAGNOSIS — I119 Hypertensive heart disease without heart failure: Secondary | ICD-10-CM | POA: Diagnosis not present

## 2024-01-11 DIAGNOSIS — E039 Hypothyroidism, unspecified: Secondary | ICD-10-CM | POA: Insufficient documentation

## 2024-01-11 DIAGNOSIS — C3491 Malignant neoplasm of unspecified part of right bronchus or lung: Secondary | ICD-10-CM

## 2024-01-11 DIAGNOSIS — T50995D Adverse effect of other drugs, medicaments and biological substances, subsequent encounter: Secondary | ICD-10-CM | POA: Insufficient documentation

## 2024-01-11 DIAGNOSIS — R5381 Other malaise: Secondary | ICD-10-CM | POA: Diagnosis not present

## 2024-01-11 DIAGNOSIS — D61810 Antineoplastic chemotherapy induced pancytopenia: Secondary | ICD-10-CM | POA: Diagnosis not present

## 2024-01-11 DIAGNOSIS — I2721 Secondary pulmonary arterial hypertension: Secondary | ICD-10-CM | POA: Insufficient documentation

## 2024-01-11 DIAGNOSIS — B370 Candidal stomatitis: Secondary | ICD-10-CM | POA: Diagnosis not present

## 2024-01-11 DIAGNOSIS — K5909 Other constipation: Secondary | ICD-10-CM | POA: Diagnosis not present

## 2024-01-11 DIAGNOSIS — D649 Anemia, unspecified: Secondary | ICD-10-CM

## 2024-01-11 DIAGNOSIS — J4489 Other specified chronic obstructive pulmonary disease: Secondary | ICD-10-CM | POA: Diagnosis not present

## 2024-01-11 DIAGNOSIS — Z5112 Encounter for antineoplastic immunotherapy: Secondary | ICD-10-CM | POA: Insufficient documentation

## 2024-01-11 DIAGNOSIS — C3411 Malignant neoplasm of upper lobe, right bronchus or lung: Secondary | ICD-10-CM | POA: Insufficient documentation

## 2024-01-11 DIAGNOSIS — F40240 Claustrophobia: Secondary | ICD-10-CM

## 2024-01-11 DIAGNOSIS — C7972 Secondary malignant neoplasm of left adrenal gland: Secondary | ICD-10-CM | POA: Diagnosis not present

## 2024-01-11 LAB — CBC WITH DIFFERENTIAL (CANCER CENTER ONLY)
Abs Immature Granulocytes: 0.03 K/uL (ref 0.00–0.07)
Basophils Absolute: 0 K/uL (ref 0.0–0.1)
Basophils Relative: 0 %
Eosinophils Absolute: 0.2 K/uL (ref 0.0–0.5)
Eosinophils Relative: 2 %
HCT: 26.5 % — ABNORMAL LOW (ref 36.0–46.0)
Hemoglobin: 9.1 g/dL — ABNORMAL LOW (ref 12.0–15.0)
Immature Granulocytes: 0 %
Lymphocytes Relative: 12 %
Lymphs Abs: 0.8 K/uL (ref 0.7–4.0)
MCH: 31.7 pg (ref 26.0–34.0)
MCHC: 34.3 g/dL (ref 30.0–36.0)
MCV: 92.3 fL (ref 80.0–100.0)
Monocytes Absolute: 0.7 K/uL (ref 0.1–1.0)
Monocytes Relative: 11 %
Neutro Abs: 5.2 K/uL (ref 1.7–7.7)
Neutrophils Relative %: 75 %
Platelet Count: 261 K/uL (ref 150–400)
RBC: 2.87 MIL/uL — ABNORMAL LOW (ref 3.87–5.11)
RDW: 15.9 % — ABNORMAL HIGH (ref 11.5–15.5)
WBC Count: 6.9 K/uL (ref 4.0–10.5)
nRBC: 0 % (ref 0.0–0.2)

## 2024-01-11 LAB — CMP (CANCER CENTER ONLY)
ALT: 27 U/L (ref 0–44)
AST: 34 U/L (ref 15–41)
Albumin: 4.3 g/dL (ref 3.5–5.0)
Alkaline Phosphatase: 86 U/L (ref 38–126)
Anion gap: 15 (ref 5–15)
BUN: 9 mg/dL (ref 8–23)
CO2: 25 mmol/L (ref 22–32)
Calcium: 10.7 mg/dL — ABNORMAL HIGH (ref 8.9–10.3)
Chloride: 103 mmol/L (ref 98–111)
Creatinine: 1.47 mg/dL — ABNORMAL HIGH (ref 0.44–1.00)
GFR, Estimated: 40 mL/min — ABNORMAL LOW
Glucose, Bld: 123 mg/dL — ABNORMAL HIGH (ref 70–99)
Potassium: 3.4 mmol/L — ABNORMAL LOW (ref 3.5–5.1)
Sodium: 142 mmol/L (ref 135–145)
Total Bilirubin: 0.6 mg/dL (ref 0.0–1.2)
Total Protein: 7.1 g/dL (ref 6.5–8.1)

## 2024-01-11 LAB — SAMPLE TO BLOOD BANK

## 2024-01-11 LAB — TSH: TSH: 3.58 u[IU]/mL (ref 0.350–4.500)

## 2024-01-11 MED ORDER — LORAZEPAM 0.5 MG PO TABS: ORAL_TABLET | ORAL | 0 refills | Status: AC

## 2024-01-11 MED ORDER — CYANOCOBALAMIN 1000 MCG/ML IJ SOLN
1000.0000 ug | Freq: Once | INTRAMUSCULAR | Status: AC
  Administered 2024-01-11: 1000 ug via INTRAMUSCULAR
  Filled 2024-01-11: qty 1

## 2024-01-11 MED ORDER — NYSTATIN 100000 UNIT/ML MT SUSP: 5.0000 mL | Freq: Four times a day (QID) | OROMUCOSAL | 0 refills | Status: AC

## 2024-01-11 MED ORDER — SODIUM CHLORIDE 0.9% FLUSH
10.0000 mL | INTRAVENOUS | Status: DC | PRN
  Administered 2024-01-11: 10 mL

## 2024-01-11 MED ORDER — SODIUM CHLORIDE 0.9 % IV SOLN: INTRAVENOUS | Status: DC

## 2024-01-11 MED ORDER — SODIUM CHLORIDE 0.9 % IV SOLN
200.0000 mg | Freq: Once | INTRAVENOUS | Status: AC
  Administered 2024-01-11: 200 mg via INTRAVENOUS
  Filled 2024-01-11: qty 200

## 2024-01-11 MED ORDER — PROCHLORPERAZINE MALEATE 10 MG PO TABS
10.0000 mg | ORAL_TABLET | Freq: Once | ORAL | Status: AC
  Administered 2024-01-11: 10 mg via ORAL
  Filled 2024-01-11: qty 1

## 2024-01-11 MED ORDER — MIRTAZAPINE 7.5 MG PO TABS: 7.5000 mg | ORAL_TABLET | Freq: Every day | ORAL | 2 refills | Status: AC

## 2024-01-11 NOTE — Patient Instructions (Addendum)
 [facility] - A DEPT OF [facility]  Discharge Instructions: Thank you for choosing [facility] to provide your oncology and hematology care.   If you have a lab appointment with the Cancer Center, please go directly to the Cancer Center and check in at the registration area.   Wear comfortable clothing and clothing appropriate for easy access to any Portacath or PICC line.   We strive to give you quality time with your provider. You may need to reschedule your appointment if you arrive late (15 or more minutes).  Arriving late affects you and other patients whose appointments are after yours.  Also, if you miss three or more appointments without notifying the office, you may be dismissed from the clinic at the provider's discretion.      For prescription refill requests, have your pharmacy contact our office and allow 72 hours for refills to be completed.    Today you received the following chemotherapy and/or immunotherapy agent: Pembrolizumab  (Keytruda )   To help prevent nausea and vomiting after your treatment, we encourage you to take your nausea medication as directed.  BELOW ARE SYMPTOMS THAT SHOULD BE REPORTED IMMEDIATELY: *FEVER GREATER THAN 100.4 F (38 C) OR HIGHER *CHILLS OR SWEATING *NAUSEA AND VOMITING THAT IS NOT CONTROLLED WITH YOUR NAUSEA MEDICATION *UNUSUAL SHORTNESS OF BREATH *UNUSUAL BRUISING OR BLEEDING *URINARY PROBLEMS (pain or burning when urinating, or frequent urination) *BOWEL PROBLEMS (unusual diarrhea, constipation, pain near the anus) TENDERNESS IN MOUTH AND THROAT WITH OR WITHOUT PRESENCE OF ULCERS (sore throat, sores in mouth, or a toothache) UNUSUAL RASH, SWELLING OR PAIN  UNUSUAL VAGINAL DISCHARGE OR ITCHING   Items with * indicate a potential emergency and should be followed up as soon as possible or go to the Emergency Department if any problems should occur.  Please show the CHEMOTHERAPY ALERT CARD or  IMMUNOTHERAPY ALERT CARD at check-in to the Emergency Department and triage nurse.  Should you have questions after your visit or need to cancel or reschedule your appointment, please contact [facility] - A DEPT [facility]  Dept: [phone]  and follow the prompts.  Office hours are 8:00 a.m. to 4:30 p.m. Monday - Friday. Please note that voicemails left after 4:00 p.m. may not be returned until the following business day.  We are closed weekends and major holidays. You have access to a nurse at all times for urgent questions. Please call the main number to the clinic Dept: [phone] and follow the prompts.   For any non-urgent questions, you may also contact your provider using MyChart. We now offer e-Visits for anyone 42 and older to request care online for non-urgent symptoms. For details visit [email or web address].   Also download the MyChart app! Go to the app store, search MyChart, open the app, select , and log in with your MyChart username and password.  Hypokalemia Hypokalemia means that the amount of potassium in the blood is lower than normal. Potassium is a mineral (electrolyte) that helps regulate the amount of fluid in the body. It also stimulates muscle tightening (contraction) and helps nerves work properly. Normally, most of the body's potassium is inside cells, and only a very small amount is in the blood. Because the amount in the blood is so small, minor changes to potassium levels in the blood can be life-threatening. What are the causes? This condition may be caused by: Antibiotic medicine. Diarrhea or  vomiting. Taking too much of a medicine that helps you have a bowel movement (laxative) can cause diarrhea and lead to hypokalemia. Chronic kidney disease (CKD). Medicines that help the body get rid of excess fluid (diuretics). Eating disorders, such as anorexia or bulimia. Low magnesium  levels in the body. Sweating a  lot. What are the signs or symptoms? Symptoms of this condition include: Weakness. Constipation. Fatigue. Muscle cramps. Mental confusion. Skipped heartbeats or irregular heartbeat (palpitations). Tingling or numbness. How is this diagnosed? This condition is diagnosed with a blood test. How is this treated? This condition may be treated by: Taking potassium supplements. Adjusting the medicines that you take. Eating more foods that contain a lot of potassium. If your potassium level is very low, you may need to get potassium through an IV and be monitored in the hospital. Follow these instructions at home: Eating and drinking  Eat a healthy diet. A healthy diet includes fresh fruits and vegetables, whole grains, healthy fats, and lean proteins. If told, eat more foods that contain a lot of potassium. These include: Nuts, such as peanuts and pistachios. Seeds, such as sunflower seeds and pumpkin seeds. Peas, lentils, and lima beans. Whole grain and bran cereals and breads. Fresh fruits and vegetables, such as apricots, avocado, bananas, cantaloupe, kiwi, oranges, tomatoes, asparagus, and potatoes. Juices, such as orange, tomato, and prune. Lean meats, including fish. Milk and milk products, such as yogurt. General instructions Take over-the-counter and prescription medicines only as told by your health care provider. This includes vitamins, natural food products, and supplements. Keep all follow-up visits. This is important. Contact a health care provider if: You have weakness that gets worse. You feel your heart pounding or racing. You vomit. You have diarrhea. You have diabetes and you have trouble keeping your blood sugar in your target range. Get help right away if: You have chest pain. You have shortness of breath. You have vomiting or diarrhea that lasts for more than 2 days. You faint. These symptoms may be an emergency. Get help right away. Call 911. Do not wait  to see if the symptoms will go away. Do not drive yourself to the hospital. Summary Hypokalemia means that the amount of potassium in the blood is lower than normal. This condition is diagnosed with a blood test. Hypokalemia may be treated by taking potassium supplements, adjusting the medicines that you take, or eating more foods that are high in potassium. If your potassium level is very low, you may need to get potassium through an IV and be monitored in the hospital. This information is not intended to replace advice given to you by your health care provider. Make sure you discuss any questions you have with your health care provider. Document Revised: 09/06/2020 Document Reviewed: 09/06/2020 Elsevier Patient Education  2024 [employer].

## 2024-01-12 LAB — T4: T4, Total: 8 ug/dL (ref 4.5–12.0)

## 2024-01-13 ENCOUNTER — Ambulatory Visit: Admitting: Student in an Organized Health Care Education/Training Program

## 2024-01-13 ENCOUNTER — Encounter: Payer: Self-pay | Admitting: Student in an Organized Health Care Education/Training Program

## 2024-01-13 ENCOUNTER — Other Ambulatory Visit: Payer: Self-pay

## 2024-01-13 ENCOUNTER — Telehealth: Payer: Self-pay | Admitting: Medical Oncology

## 2024-01-13 VITALS — BP 120/80 | HR 100 | Temp 98.1°F | Ht 67.0 in | Wt 200.0 lb

## 2024-01-13 DIAGNOSIS — R0602 Shortness of breath: Secondary | ICD-10-CM

## 2024-01-13 DIAGNOSIS — Z87891 Personal history of nicotine dependence: Secondary | ICD-10-CM

## 2024-01-13 DIAGNOSIS — C3490 Malignant neoplasm of unspecified part of unspecified bronchus or lung: Secondary | ICD-10-CM | POA: Diagnosis not present

## 2024-01-13 MED ORDER — UMECLIDINIUM-VILANTEROL 62.5-25 MCG/ACT IN AEPB: 1.0000 | INHALATION_SPRAY | Freq: Every day | RESPIRATORY_TRACT | 12 refills | Status: AC

## 2024-01-13 MED ORDER — ALBUTEROL SULFATE HFA 108 (90 BASE) MCG/ACT IN AERS: 2.0000 | INHALATION_SPRAY | RESPIRATORY_TRACT | 0 refills | Status: AC | PRN

## 2024-01-13 NOTE — Progress Notes (Signed)
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  Allergies Allergen Reactions   Wound Dressing Adhesive Other (See Comments)    Ripped skin    Apresoline  [Hydralazine ] Nausea Only   Latex Other (See Comments)    Blisters/burns/welts    Other Other (See Comments)    Band aids discolor skin EKG pads irritate skin    Prednisone  Swelling    Patient states that she can take methylprednisolone  without complications She has tolerated PO dexamethasone  well. No complaints of swelling voiced from patient.   "

## 2024-01-13 NOTE — Patient Instructions (Addendum)
" °  VISIT SUMMARY: Today, we discussed your ongoing treatment for stage four lung cancer and addressed your recent visit to the ER for shortness of breath. We reviewed your current medications and made some adjustments to help manage your symptoms better.  YOUR PLAN: -STAGE IV LUNG ADENOCARCINOMA: Stage IV lung adenocarcinoma is an advanced form of lung cancer that has spread to other parts of the body. Your recent CT scan showed a partial response to the therapy, but there is significant involvement in the right upper lung causing shortness of breath. We have prescribed a daily inhaler to help manage your breathing difficulties. You should continue using your albuterol  inhaler as needed and continue with your immunotherapy treatment using Keytruda .  -ADVERSE EFFECTS OF ANTINEOPLASTIC THERAPY: Antineoplastic therapy refers to treatments that stop the growth of cancer cells. You have experienced significant side effects from chemotherapy, which have improved since stopping it. However, you are still experiencing side effects from Keytruda , such as fatigue and taste changes. Despite these side effects, your overall condition has improved. You should continue with your immunotherapy treatment using Keytruda .  INSTRUCTIONS: Please follow up with us  as scheduled to monitor your response to the new inhaler and ongoing immunotherapy. If you experience any new or worsening symptoms, contact our office immediately.                      Contains text generated by [employer].                                 Contains text generated by [employer].   "

## 2024-01-13 NOTE — Telephone Encounter (Signed)
 Asking if pt can get a dental cleaning. I told Luke it was ok since pt only on immunotherapy.

## 2024-01-14 ENCOUNTER — Ambulatory Visit: Attending: Internal Medicine | Admitting: *Deleted

## 2024-01-14 ENCOUNTER — Encounter: Payer: Self-pay | Admitting: *Deleted

## 2024-01-14 DIAGNOSIS — I4891 Unspecified atrial fibrillation: Secondary | ICD-10-CM

## 2024-01-14 NOTE — Patient Instructions (Signed)
 Your physician recommends that you continue on your current medications as directed. Please refer to the Current Medication list given to you today. Advised to restart metoprolol  tartrate 12.5 mg twice daily Follow up as planned Will be contacted after EKG has been reviewed by provider.

## 2024-01-14 NOTE — Progress Notes (Signed)
 Presents for EKG Per last visit with Miriam: A-fib w/RVR-Will restart Lopressor  12.5 mg BID and encouraged her to stay well hydrated. She will return in 1 week to repeat an EKG   Medications reviewed and it was revealed that patient has not taken lopressor  12.5 mg BID. Reports that her heart rate has been reading low on her pulse oximeter and she decided that she didn't need to take lopressor  12.5 mg BID but says she has taken it at least three times since her last visit. Explained that pulse oximeters will not measure her heart rate accurately if her heart is out of rhythm. Encouraged patient to use her watch that performs an EKG to check her actual heart rate. Encouraged to take lopressor  12.5 mg BID as suggested at last visit.  EKG done and routed to provider for review.

## 2024-01-15 ENCOUNTER — Telehealth: Payer: Self-pay | Admitting: *Deleted

## 2024-01-15 NOTE — Telephone Encounter (Signed)
-----   Message from Almarie Crate, NP sent at 01/14/2024  4:53 PM EST ----- Thanks for seeing her. EKG shows A-fib with RVR. Agree with your recommendation to restart taking Lopressor  12.5 mg BID consistently and can bring back in 1 week for repeat EKG.   Thanks!  Best, Almarie Crate, NP

## 2024-01-15 NOTE — Telephone Encounter (Signed)
 Patient informed and verbalized understanding of plan. Per daughter, need PM appointment and request nurse visit in [place] due to it being closer and more convenient. Nurse visit for repeat EKG scheduled 01/21/2024 @4 :00 pm at the [place] office.

## 2024-01-18 ENCOUNTER — Ambulatory Visit (HOSPITAL_COMMUNITY)
Admission: RE | Admit: 2024-01-18 | Discharge: 2024-01-18 | Disposition: A | Discharge status: Home / Self Care | Source: Ambulatory Visit | Attending: Physician Assistant | Admitting: Physician Assistant

## 2024-01-18 DIAGNOSIS — C3491 Malignant neoplasm of unspecified part of right bronchus or lung: Secondary | ICD-10-CM | POA: Insufficient documentation

## 2024-01-18 MED ORDER — GADOBUTROL 1 MMOL/ML IV SOLN
9.0000 mL | Freq: Once | INTRAVENOUS | Status: AC | PRN
  Administered 2024-01-18: 9 mL via INTRAVENOUS

## 2024-01-21 ENCOUNTER — Ambulatory Visit: Attending: Cardiology

## 2024-01-21 ENCOUNTER — Encounter: Payer: Self-pay | Admitting: Nurse Practitioner

## 2024-01-21 DIAGNOSIS — I482 Chronic atrial fibrillation, unspecified: Secondary | ICD-10-CM | POA: Diagnosis not present

## 2024-01-21 NOTE — Progress Notes (Signed)
Nurse Visit/EKG

## 2024-01-22 NOTE — Progress Notes (Signed)
 Patient notified

## 2024-01-23 ENCOUNTER — Other Ambulatory Visit: Payer: Self-pay

## 2024-01-24 ENCOUNTER — Other Ambulatory Visit: Payer: Self-pay | Admitting: Internal Medicine

## 2024-01-24 ENCOUNTER — Other Ambulatory Visit: Payer: Self-pay | Admitting: Physician Assistant

## 2024-01-24 DIAGNOSIS — E876 Hypokalemia: Secondary | ICD-10-CM

## 2024-01-25 ENCOUNTER — Encounter: Payer: Self-pay | Admitting: Internal Medicine

## 2024-01-25 ENCOUNTER — Telehealth: Payer: Self-pay

## 2024-01-25 NOTE — Telephone Encounter (Signed)
 Attempted to contact patient regarding brain MRI results. Unable to reach patient; voicemail message left requesting a return call.   Per Cassie, PA, brain MRI shows no new or worsening metastatic disease. Imaging demonstrates improvement in previously identified areas. Awaiting return call to review results with patient.

## 2024-01-27 ENCOUNTER — Encounter: Payer: Self-pay | Admitting: Internal Medicine

## 2024-01-27 DIAGNOSIS — R002 Palpitations: Secondary | ICD-10-CM | POA: Diagnosis not present

## 2024-01-28 NOTE — Progress Notes (Unsigned)
 Becky [name]
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
## 2024-02-01 ENCOUNTER — Telehealth: Payer: Self-pay | Admitting: Genetic Counselor

## 2024-02-01 ENCOUNTER — Telehealth: Payer: Self-pay | Admitting: Physician Assistant

## 2024-02-01 ENCOUNTER — Ambulatory Visit: Admitting: Family Medicine

## 2024-02-01 NOTE — Telephone Encounter (Signed)
 Left the patient a voicemail with the rescheduled appointment details.

## 2024-02-01 NOTE — Telephone Encounter (Signed)
 Molecular Testing Review Molecular test report from [place] reviewed by dentist.   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   Please discuss with patient and refer to genetic counseling if interested.   Becky Ogren, MS, [facility] Licensed, [name] phone: [phone]
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
## 2024-02-02 ENCOUNTER — Other Ambulatory Visit: Payer: Self-pay | Admitting: Physician Assistant

## 2024-02-02 ENCOUNTER — Inpatient Hospital Stay

## 2024-02-02 ENCOUNTER — Inpatient Hospital Stay (HOSPITAL_BASED_OUTPATIENT_CLINIC_OR_DEPARTMENT_OTHER): Admitting: Physician Assistant

## 2024-02-02 ENCOUNTER — Inpatient Hospital Stay: Admitting: Dietician

## 2024-02-02 ENCOUNTER — Inpatient Hospital Stay: Admitting: Physician Assistant

## 2024-02-02 VITALS — BP 116/83 | HR 89 | Temp 98.1°F | Resp 17 | Ht 67.0 in | Wt 192.0 lb

## 2024-02-02 DIAGNOSIS — C3491 Malignant neoplasm of unspecified part of right bronchus or lung: Secondary | ICD-10-CM | POA: Diagnosis not present

## 2024-02-02 DIAGNOSIS — Z5112 Encounter for antineoplastic immunotherapy: Secondary | ICD-10-CM | POA: Diagnosis not present

## 2024-02-02 DIAGNOSIS — E876 Hypokalemia: Secondary | ICD-10-CM

## 2024-02-02 DIAGNOSIS — B370 Candidal stomatitis: Secondary | ICD-10-CM

## 2024-02-02 LAB — MAGNESIUM: Magnesium: 1.8 mg/dL (ref 1.7–2.4)

## 2024-02-02 LAB — CBC WITH DIFFERENTIAL (CANCER CENTER ONLY)
Abs Immature Granulocytes: 0.05 10*3/uL (ref 0.00–0.07)
Basophils Absolute: 0 10*3/uL (ref 0.0–0.1)
Basophils Relative: 0 %
Eosinophils Absolute: 0.1 10*3/uL (ref 0.0–0.5)
Eosinophils Relative: 2 %
HCT: 26.1 % — ABNORMAL LOW (ref 36.0–46.0)
Hemoglobin: 8.9 g/dL — ABNORMAL LOW (ref 12.0–15.0)
Immature Granulocytes: 1 %
Lymphocytes Relative: 8 %
Lymphs Abs: 0.6 10*3/uL — ABNORMAL LOW (ref 0.7–4.0)
MCH: 33.3 pg (ref 26.0–34.0)
MCHC: 34.1 g/dL (ref 30.0–36.0)
MCV: 97.8 fL (ref 80.0–100.0)
Monocytes Absolute: 0.8 10*3/uL (ref 0.1–1.0)
Monocytes Relative: 10 %
Neutro Abs: 6.5 10*3/uL (ref 1.7–7.7)
Neutrophils Relative %: 79 %
Platelet Count: 289 10*3/uL (ref 150–400)
RBC: 2.67 MIL/uL — ABNORMAL LOW (ref 3.87–5.11)
RDW: 17 % — ABNORMAL HIGH (ref 11.5–15.5)
WBC Count: 8.1 10*3/uL (ref 4.0–10.5)
nRBC: 0 % (ref 0.0–0.2)

## 2024-02-02 LAB — CMP (CANCER CENTER ONLY)
ALT: 20 U/L (ref 0–44)
AST: 29 U/L (ref 15–41)
Albumin: 4.3 g/dL (ref 3.5–5.0)
Alkaline Phosphatase: 76 U/L (ref 38–126)
Anion gap: 14 (ref 5–15)
BUN: 8 mg/dL (ref 8–23)
CO2: 28 mmol/L (ref 22–32)
Calcium: 10.6 mg/dL — ABNORMAL HIGH (ref 8.9–10.3)
Chloride: 98 mmol/L (ref 98–111)
Creatinine: 1.17 mg/dL — ABNORMAL HIGH (ref 0.44–1.00)
GFR, Estimated: 53 mL/min — ABNORMAL LOW
Glucose, Bld: 102 mg/dL — ABNORMAL HIGH (ref 70–99)
Potassium: 3.1 mmol/L — ABNORMAL LOW (ref 3.5–5.1)
Sodium: 140 mmol/L (ref 135–145)
Total Bilirubin: 0.6 mg/dL (ref 0.0–1.2)
Total Protein: 6.9 g/dL (ref 6.5–8.1)

## 2024-02-02 LAB — SAMPLE TO BLOOD BANK

## 2024-02-02 MED ORDER — SODIUM CHLORIDE 0.9 % IV SOLN: INTRAVENOUS | Status: DC

## 2024-02-02 MED ORDER — FLUCONAZOLE 100 MG PO TABS: 100.0000 mg | ORAL_TABLET | Freq: Every day | ORAL | 0 refills | Status: AC

## 2024-02-02 MED ORDER — POTASSIUM CHLORIDE CRYS ER 20 MEQ PO TBCR: 20.0000 meq | EXTENDED_RELEASE_TABLET | Freq: Every day | ORAL | 0 refills | Status: AC

## 2024-02-02 MED ORDER — SODIUM CHLORIDE 0.9 % IV SOLN
200.0000 mg | Freq: Once | INTRAVENOUS | Status: AC
  Administered 2024-02-02: 200 mg via INTRAVENOUS
  Filled 2024-02-02: qty 200

## 2024-02-02 MED ORDER — PROCHLORPERAZINE MALEATE 10 MG PO TABS
10.0000 mg | ORAL_TABLET | Freq: Once | ORAL | Status: AC
  Administered 2024-02-02: 10 mg via ORAL
  Filled 2024-02-02: qty 1

## 2024-02-02 NOTE — Patient Instructions (Signed)
 [facility] - A DEPT OF [facility]  Discharge Instructions: Thank you for choosing [facility] to provide your oncology and hematology care.   If you have a lab appointment with the Cancer Center, please go directly to the Cancer Center and check in at the registration area.   Wear comfortable clothing and clothing appropriate for easy access to any Portacath or PICC line.   We strive to give you quality time with your provider. You may need to reschedule your appointment if you arrive late (15 or more minutes).  Arriving late affects you and other patients whose appointments are after yours.  Also, if you miss three or more appointments without notifying the office, you may be dismissed from the clinic at the provider's discretion.      For prescription refill requests, have your pharmacy contact our office and allow 72 hours for refills to be completed.    Today you received the following chemotherapy and/or immunotherapy agent: Pembrolizumab  (Keytruda )   To help prevent nausea and vomiting after your treatment, we encourage you to take your nausea medication as directed.  BELOW ARE SYMPTOMS THAT SHOULD BE REPORTED IMMEDIATELY: *FEVER GREATER THAN 100.4 F (38 C) OR HIGHER *CHILLS OR SWEATING *NAUSEA AND VOMITING THAT IS NOT CONTROLLED WITH YOUR NAUSEA MEDICATION *UNUSUAL SHORTNESS OF BREATH *UNUSUAL BRUISING OR BLEEDING *URINARY PROBLEMS (pain or burning when urinating, or frequent urination) *BOWEL PROBLEMS (unusual diarrhea, constipation, pain near the anus) TENDERNESS IN MOUTH AND THROAT WITH OR WITHOUT PRESENCE OF ULCERS (sore throat, sores in mouth, or a toothache) UNUSUAL RASH, SWELLING OR PAIN  UNUSUAL VAGINAL DISCHARGE OR ITCHING   Items with * indicate a potential emergency and should be followed up as soon as possible or go to the Emergency Department if any problems should occur.  Please show the CHEMOTHERAPY ALERT CARD or  IMMUNOTHERAPY ALERT CARD at check-in to the Emergency Department and triage nurse.  Should you have questions after your visit or need to cancel or reschedule your appointment, please contact [facility] - A DEPT OF [facility]  Dept: [phone]  and follow the prompts.  Office hours are 8:00 a.m. to 4:30 p.m. Monday - Friday. Please note that voicemails left after 4:00 p.m. may not be returned until the following business day.  We are closed weekends and major holidays. You have access to a nurse at all times for urgent questions. Please call the main number to the clinic Dept: [phone] and follow the prompts.   For any non-urgent questions, you may also contact your provider using MyChart. We now offer e-Visits for anyone 42 and older to request care online for non-urgent symptoms. For details visit [email or web address].   Also download the MyChart app! Go to the app store, search MyChart, open the app, select , and log in with your MyChart username and password.  Hypokalemia Hypokalemia means that the amount of potassium in the blood is lower than normal. Potassium is a mineral (electrolyte) that helps regulate the amount of fluid in the body. It also stimulates muscle tightening (contraction) and helps nerves work properly. Normally, most of the body's potassium is inside cells, and only a very small amount is in the blood. Because the amount in the blood is so small, minor changes to potassium levels in the blood can be life-threatening. What are the causes? This condition may be caused by: Antibiotic medicine. Diarrhea or  vomiting. Taking too much of a medicine that helps you have a bowel movement (laxative) can cause diarrhea and lead to hypokalemia. Chronic kidney disease (CKD). Medicines that help the body get rid of excess fluid (diuretics). Eating disorders, such as anorexia or bulimia. Low magnesium  levels in the body. Sweating a  lot. What are the signs or symptoms? Symptoms of this condition include: Weakness. Constipation. Fatigue. Muscle cramps. Mental confusion. Skipped heartbeats or irregular heartbeat (palpitations). Tingling or numbness. How is this diagnosed? This condition is diagnosed with a blood test. How is this treated? This condition may be treated by: Taking potassium supplements. Adjusting the medicines that you take. Eating more foods that contain a lot of potassium. If your potassium level is very low, you may need to get potassium through an IV and be monitored in the hospital. Follow these instructions at home: Eating and drinking  Eat a healthy diet. A healthy diet includes fresh fruits and vegetables, whole grains, healthy fats, and lean proteins. If told, eat more foods that contain a lot of potassium. These include: Nuts, such as peanuts and pistachios. Seeds, such as sunflower seeds and pumpkin seeds. Peas, lentils, and lima beans. Whole grain and bran cereals and breads. Fresh fruits and vegetables, such as apricots, avocado, bananas, cantaloupe, kiwi, oranges, tomatoes, asparagus, and potatoes. Juices, such as orange, tomato, and prune. Lean meats, including fish. Milk and milk products, such as yogurt. General instructions Take over-the-counter and prescription medicines only as told by your health care provider. This includes vitamins, natural food products, and supplements. Keep all follow-up visits. This is important. Contact a health care provider if: You have weakness that gets worse. You feel your heart pounding or racing. You vomit. You have diarrhea. You have diabetes and you have trouble keeping your blood sugar in your target range. Get help right away if: You have chest pain. You have shortness of breath. You have vomiting or diarrhea that lasts for more than 2 days. You faint. These symptoms may be an emergency. Get help right away. Call 911. Do not wait  to see if the symptoms will go away. Do not drive yourself to the hospital. Summary Hypokalemia means that the amount of potassium in the blood is lower than normal. This condition is diagnosed with a blood test. Hypokalemia may be treated by taking potassium supplements, adjusting the medicines that you take, or eating more foods that are high in potassium. If your potassium level is very low, you may need to get potassium through an IV and be monitored in the hospital. This information is not intended to replace advice given to you by your health care provider. Make sure you discuss any questions you have with your health care provider. Document Revised: 09/06/2020 Document Reviewed: 09/06/2020 Elsevier Patient Education  2024 [employer].

## 2024-02-02 NOTE — Patient Instructions (Signed)

## 2024-02-03 ENCOUNTER — Encounter: Payer: Self-pay | Admitting: Internal Medicine

## 2024-02-04 ENCOUNTER — Ambulatory Visit: Payer: Self-pay | Admitting: Nurse Practitioner

## 2024-02-07 ENCOUNTER — Other Ambulatory Visit: Payer: Self-pay

## 2024-02-09 ENCOUNTER — Inpatient Hospital Stay: Attending: Internal Medicine | Admitting: Dietician

## 2024-02-10 ENCOUNTER — Telehealth: Payer: Self-pay

## 2024-02-10 NOTE — Telephone Encounter (Signed)
 Patient stated she believes the Keytruda  is causing her body aches and pains and she wanted to know if there is a medication you can send in, or what you suggest she take to manage the pain? Thank you!

## 2024-02-10 NOTE — Telephone Encounter (Signed)
 Voicemail left for patient to confirm she is still taking Tylenol  and has Lidocaine  cream.  Awaiting call back from patient.

## 2024-02-11 ENCOUNTER — Telehealth: Payer: Self-pay

## 2024-02-11 ENCOUNTER — Ambulatory Visit: Admitting: Nurse Practitioner

## 2024-02-11 ENCOUNTER — Encounter: Payer: Self-pay | Admitting: Internal Medicine

## 2024-02-11 NOTE — Telephone Encounter (Signed)
 Spoke with patient in regards to her possible keytruda  side effects.

## 2024-02-22 ENCOUNTER — Inpatient Hospital Stay

## 2024-02-22 ENCOUNTER — Inpatient Hospital Stay: Admitting: Internal Medicine

## 2024-03-14 ENCOUNTER — Inpatient Hospital Stay

## 2024-03-14 ENCOUNTER — Inpatient Hospital Stay: Admitting: Physician Assistant

## 2024-03-14 ENCOUNTER — Inpatient Hospital Stay: Attending: Internal Medicine

## 2024-05-02 ENCOUNTER — Ambulatory Visit: Admitting: Nurse Practitioner
